# Patient Record
Sex: Female | Born: 1935
Health system: Southern US, Community
[De-identification: ages and names within clinical notes are randomized; demographics above are authoritative.]

## PROBLEM LIST (undated history)

## (undated) DIAGNOSIS — K5790 Diverticulosis of intestine, part unspecified, without perforation or abscess without bleeding: Secondary | ICD-10-CM

## (undated) DIAGNOSIS — C009 Malignant neoplasm of lip, unspecified: Secondary | ICD-10-CM

## (undated) DIAGNOSIS — N183 Chronic kidney disease, stage 3 unspecified: Secondary | ICD-10-CM

## (undated) DIAGNOSIS — M170 Bilateral primary osteoarthritis of knee: Secondary | ICD-10-CM

## (undated) DIAGNOSIS — C7800 Secondary malignant neoplasm of unspecified lung: Secondary | ICD-10-CM

## (undated) DIAGNOSIS — M7021 Olecranon bursitis, right elbow: Secondary | ICD-10-CM

## (undated) DIAGNOSIS — D509 Iron deficiency anemia, unspecified: Secondary | ICD-10-CM

## (undated) DIAGNOSIS — Z7901 Long term (current) use of anticoagulants: Secondary | ICD-10-CM

## (undated) DIAGNOSIS — E785 Hyperlipidemia, unspecified: Secondary | ICD-10-CM

## (undated) DIAGNOSIS — IMO0001 Reserved for inherently not codable concepts without codable children: Secondary | ICD-10-CM

## (undated) DIAGNOSIS — Z8489 Family history of other specified conditions: Secondary | ICD-10-CM

## (undated) DIAGNOSIS — R195 Other fecal abnormalities: Secondary | ICD-10-CM

## (undated) DIAGNOSIS — L97909 Non-pressure chronic ulcer of unspecified part of unspecified lower leg with unspecified severity: Secondary | ICD-10-CM

## (undated) DIAGNOSIS — R7301 Impaired fasting glucose: Secondary | ICD-10-CM

## (undated) DIAGNOSIS — C8290 Follicular lymphoma, unspecified, unspecified site: Secondary | ICD-10-CM

## (undated) DIAGNOSIS — I890 Lymphedema, not elsewhere classified: Secondary | ICD-10-CM

## (undated) DIAGNOSIS — I4819 Other persistent atrial fibrillation: Secondary | ICD-10-CM

## (undated) DIAGNOSIS — D61810 Antineoplastic chemotherapy induced pancytopenia: Secondary | ICD-10-CM

## (undated) DIAGNOSIS — D638 Anemia in other chronic diseases classified elsewhere: Secondary | ICD-10-CM

## (undated) DIAGNOSIS — E039 Hypothyroidism, unspecified: Secondary | ICD-10-CM

## (undated) DIAGNOSIS — I10 Essential (primary) hypertension: Secondary | ICD-10-CM

## (undated) DIAGNOSIS — I2722 Pulmonary hypertension due to left heart disease: Secondary | ICD-10-CM

## (undated) DIAGNOSIS — E538 Deficiency of other specified B group vitamins: Secondary | ICD-10-CM

## (undated) DIAGNOSIS — T451X5A Adverse effect of antineoplastic and immunosuppressive drugs, initial encounter: Secondary | ICD-10-CM

## (undated) DIAGNOSIS — E669 Obesity, unspecified: Secondary | ICD-10-CM

## (undated) DIAGNOSIS — I5032 Chronic diastolic (congestive) heart failure: Secondary | ICD-10-CM

## (undated) DIAGNOSIS — F419 Anxiety disorder, unspecified: Secondary | ICD-10-CM

## (undated) DIAGNOSIS — I83009 Varicose veins of unspecified lower extremity with ulcer of unspecified site: Secondary | ICD-10-CM

## (undated) DIAGNOSIS — J449 Chronic obstructive pulmonary disease, unspecified: Secondary | ICD-10-CM

## (undated) HISTORY — PX: TRANSTHORACIC ECHOCARDIOGRAM: SHX275

## (undated) HISTORY — DX: Adverse effect of antineoplastic and immunosuppressive drugs, initial encounter: T45.1X5A

## (undated) HISTORY — DX: Impaired fasting glucose: R73.01

## (undated) HISTORY — DX: Bilateral primary osteoarthritis of knee: M17.0

## (undated) HISTORY — DX: Other fecal abnormalities: R19.5

## (undated) HISTORY — DX: Pulmonary hypertension due to left heart disease: I27.22

## (undated) HISTORY — PX: BREAST BIOPSY: SHX20

## (undated) HISTORY — DX: Anemia in other chronic diseases classified elsewhere: D63.8

## (undated) HISTORY — DX: Iron deficiency anemia, unspecified: D50.9

## (undated) HISTORY — DX: Olecranon bursitis, right elbow: M70.21

## (undated) HISTORY — DX: Deficiency of other specified B group vitamins: E53.8

## (undated) HISTORY — DX: Malignant neoplasm of lip, unspecified: C00.9

## (undated) HISTORY — DX: Chronic kidney disease, stage 3 (moderate): N18.3

## (undated) HISTORY — DX: Lymphedema, not elsewhere classified: I89.0

## (undated) HISTORY — DX: Obesity, unspecified: E66.9

## (undated) HISTORY — DX: Follicular lymphoma, unspecified, unspecified site: C82.90

## (undated) HISTORY — PX: COLONOSCOPY: SHX174

## (undated) HISTORY — DX: Hyperlipidemia, unspecified: E78.5

## (undated) HISTORY — DX: Essential (primary) hypertension: I10

## (undated) HISTORY — DX: Chronic diastolic (congestive) heart failure: I50.32

## (undated) HISTORY — PX: HIP FRACTURE SURGERY: SHX118

## (undated) HISTORY — DX: Non-pressure chronic ulcer of unspecified part of unspecified lower leg with unspecified severity: I83.009

## (undated) HISTORY — DX: Hypothyroidism, unspecified: E03.9

## (undated) HISTORY — DX: Other persistent atrial fibrillation: I48.19

## (undated) HISTORY — DX: Varicose veins of unspecified lower extremity with ulcer of unspecified site: L97.909

## (undated) HISTORY — DX: Chronic kidney disease, stage 3 unspecified: N18.30

## (undated) HISTORY — PX: FRACTURE SURGERY: SHX138

## (undated) HISTORY — DX: Antineoplastic chemotherapy induced pancytopenia: D61.810

## (undated) HISTORY — DX: Diverticulosis of intestine, part unspecified, without perforation or abscess without bleeding: K57.90

---

## 1976-01-24 HISTORY — PX: ABDOMINAL HYSTERECTOMY: SHX81

## 1998-12-27 ENCOUNTER — Other Ambulatory Visit: Admission: RE | Admit: 1998-12-27 | Discharge: 1999-01-15 | Payer: Self-pay | Admitting: Family Medicine

## 2001-04-09 ENCOUNTER — Encounter: Payer: Self-pay | Admitting: General Surgery

## 2001-04-09 ENCOUNTER — Encounter: Admission: RE | Admit: 2001-04-09 | Discharge: 2001-04-09 | Payer: Self-pay | Admitting: General Surgery

## 2001-10-15 ENCOUNTER — Encounter: Payer: Self-pay | Admitting: General Surgery

## 2001-10-15 ENCOUNTER — Encounter: Admission: RE | Admit: 2001-10-15 | Discharge: 2001-10-15 | Payer: Self-pay | Admitting: General Surgery

## 2002-12-23 ENCOUNTER — Encounter: Admission: RE | Admit: 2002-12-23 | Discharge: 2002-12-23 | Payer: Self-pay | Admitting: General Surgery

## 2004-02-12 ENCOUNTER — Emergency Department (HOSPITAL_COMMUNITY): Admission: EM | Admit: 2004-02-12 | Discharge: 2004-02-12 | Payer: Self-pay | Admitting: Emergency Medicine

## 2004-11-15 ENCOUNTER — Encounter: Admission: RE | Admit: 2004-11-15 | Discharge: 2004-11-15 | Payer: Self-pay | Admitting: General Surgery

## 2005-12-12 ENCOUNTER — Encounter: Admission: RE | Admit: 2005-12-12 | Discharge: 2005-12-12 | Payer: Self-pay | Admitting: Family Medicine

## 2006-03-01 DIAGNOSIS — M810 Age-related osteoporosis without current pathological fracture: Secondary | ICD-10-CM | POA: Insufficient documentation

## 2006-12-28 ENCOUNTER — Encounter: Admission: RE | Admit: 2006-12-28 | Discharge: 2006-12-28 | Payer: Self-pay | Admitting: Family Medicine

## 2007-12-31 ENCOUNTER — Encounter: Admission: RE | Admit: 2007-12-31 | Discharge: 2007-12-31 | Payer: Self-pay | Admitting: Family Medicine

## 2010-01-30 ENCOUNTER — Emergency Department (HOSPITAL_COMMUNITY)
Admission: EM | Admit: 2010-01-30 | Discharge: 2010-01-30 | Payer: Self-pay | Source: Home / Self Care | Admitting: Emergency Medicine

## 2010-12-20 ENCOUNTER — Other Ambulatory Visit: Payer: Self-pay | Admitting: Family Medicine

## 2010-12-20 DIAGNOSIS — Z1231 Encounter for screening mammogram for malignant neoplasm of breast: Secondary | ICD-10-CM

## 2011-01-13 ENCOUNTER — Ambulatory Visit
Admission: RE | Admit: 2011-01-13 | Discharge: 2011-01-13 | Disposition: A | Discharge status: Home / Self Care | Payer: Medicare Other | Source: Ambulatory Visit | Attending: Family Medicine | Admitting: Family Medicine

## 2011-01-13 DIAGNOSIS — Z1231 Encounter for screening mammogram for malignant neoplasm of breast: Secondary | ICD-10-CM

## 2011-05-08 ENCOUNTER — Other Ambulatory Visit: Payer: Self-pay | Admitting: Family Medicine

## 2011-05-11 ENCOUNTER — Encounter: Payer: Self-pay | Admitting: *Deleted

## 2012-02-12 ENCOUNTER — Other Ambulatory Visit: Payer: Self-pay | Admitting: Family Medicine

## 2012-02-12 DIAGNOSIS — Z1231 Encounter for screening mammogram for malignant neoplasm of breast: Secondary | ICD-10-CM

## 2012-03-01 ENCOUNTER — Ambulatory Visit: Payer: Medicare Other

## 2012-03-14 ENCOUNTER — Ambulatory Visit
Admission: RE | Admit: 2012-03-14 | Discharge: 2012-03-14 | Disposition: A | Discharge status: Home / Self Care | Payer: Medicare Other | Source: Ambulatory Visit | Attending: Family Medicine | Admitting: Family Medicine

## 2012-03-14 DIAGNOSIS — Z1231 Encounter for screening mammogram for malignant neoplasm of breast: Secondary | ICD-10-CM

## 2012-03-22 ENCOUNTER — Ambulatory Visit: Payer: Medicare Other

## 2013-05-05 ENCOUNTER — Encounter (HOSPITAL_COMMUNITY): Payer: Self-pay | Admitting: Emergency Medicine

## 2013-05-05 ENCOUNTER — Emergency Department (HOSPITAL_COMMUNITY): Payer: Medicare Other

## 2013-05-05 ENCOUNTER — Emergency Department (HOSPITAL_COMMUNITY)
Admission: EM | Admit: 2013-05-05 | Discharge: 2013-05-05 | Disposition: A | Discharge status: Home / Self Care | Payer: Medicare Other | Attending: Emergency Medicine | Admitting: Emergency Medicine

## 2013-05-05 DIAGNOSIS — I10 Essential (primary) hypertension: Secondary | ICD-10-CM | POA: Insufficient documentation

## 2013-05-05 DIAGNOSIS — Z7982 Long term (current) use of aspirin: Secondary | ICD-10-CM | POA: Insufficient documentation

## 2013-05-05 DIAGNOSIS — Y9301 Activity, walking, marching and hiking: Secondary | ICD-10-CM | POA: Insufficient documentation

## 2013-05-05 DIAGNOSIS — Z79899 Other long term (current) drug therapy: Secondary | ICD-10-CM | POA: Insufficient documentation

## 2013-05-05 DIAGNOSIS — S8992XA Unspecified injury of left lower leg, initial encounter: Secondary | ICD-10-CM

## 2013-05-05 DIAGNOSIS — W19XXXA Unspecified fall, initial encounter: Secondary | ICD-10-CM

## 2013-05-05 DIAGNOSIS — E559 Vitamin D deficiency, unspecified: Secondary | ICD-10-CM | POA: Insufficient documentation

## 2013-05-05 DIAGNOSIS — Y929 Unspecified place or not applicable: Secondary | ICD-10-CM | POA: Insufficient documentation

## 2013-05-05 DIAGNOSIS — S99929A Unspecified injury of unspecified foot, initial encounter: Principal | ICD-10-CM

## 2013-05-05 DIAGNOSIS — S8990XA Unspecified injury of unspecified lower leg, initial encounter: Secondary | ICD-10-CM | POA: Insufficient documentation

## 2013-05-05 DIAGNOSIS — S99919A Unspecified injury of unspecified ankle, initial encounter: Principal | ICD-10-CM

## 2013-05-05 DIAGNOSIS — W010XXA Fall on same level from slipping, tripping and stumbling without subsequent striking against object, initial encounter: Secondary | ICD-10-CM | POA: Insufficient documentation

## 2013-05-05 NOTE — ED Provider Notes (Signed)
CSN: [id number]     Arrival date & time 05/05/13  1826 History   First MD Initiated Contact with Patient 05/05/13 2030     Chief Complaint  Patient presents with  . Fall     (Consider location/radiation/quality/duration/timing/severity/associated sxs/prior Treatment) HPI 80 year old female presents with left buttock pain after a fall 3 days ago. She states she's walking on uneven ground and thinks she may have slipped. She states she did not pass out, hit her had, have chest pain, dizziness, or shortness of breath. She's been ambulate since then but it hurts. She has no pain at rest. Her family encouraged her to be evaluated due to extensive bruising noted. He states her pain is a 1 currently. She declines pain medicine currently. No other injury besides her left buttock/pelvis.  Past Medical History  Diagnosis Date  . Vitamin B 12 deficiency   . HTN (hypertension)   . Vitamin D deficiency   . HLD (hyperlipidemia)    Past Surgical History  Procedure Laterality Date  . Breast biopsy     Family History  Problem Relation Age of Onset  . Stomach cancer    . Melanoma    . Lung disease    . Lymphoma    . Diabetes     History  Substance Use Topics  . Smoking status: Never Smoker   . Smokeless tobacco: Not on file  . Alcohol Use: No   OB History   Grav Para Term Preterm Abortions TAB SAB Ect Mult Living                 Review of Systems  Gastrointestinal: Negative for vomiting.  Musculoskeletal: Positive for arthralgias.  Skin: Positive for color change (ecchymosis).  Neurological: Negative for syncope, weakness, numbness and headaches.      Allergies  Review of patient's allergies indicates no known allergies.  Home Medications   Current Outpatient Rx  Name  Route  Sig  Dispense  Refill  . alendronate (FOSAMAX) 70 MG tablet   Oral   Take 70 mg by mouth every 7 (seven) days. Take with a full glass of water on an empty stomach.         Marland Kitchen aspirin 81 MG tablet  Oral   Take 81 mg by mouth daily.         . Calcium Carbonate (CALTRATE 600 PO)   Oral   Take by mouth daily.         . fish oil-omega-3 fatty acids 1000 MG capsule   Oral   Take 2 g by mouth daily.         . folic acid (FOLVITE) 409 MCG tablet   Oral   Take 400 mcg by mouth daily.         Marland Kitchen lisinopril-hydrochlorothiazide (PRINZIDE,ZESTORETIC) 20-25 MG per tablet   Oral   Take 1 tablet by mouth daily.          BP 154/84  Pulse 84  Temp(Src) 98.1 F (36.7 C) (Oral)  Resp 16  Ht 5\' 7"  (1.702 m)  Wt 182 lb (82.555 kg)  BMI 28.50 kg/m2  SpO2 100% Physical Exam  Nursing note and vitals reviewed. Constitutional: She is oriented to person, place, and time. She appears well-developed and well-nourished.  HENT:  Head: Normocephalic and atraumatic.  Right Ear: External ear normal.  Left Ear: External ear normal.  Nose: Nose normal.  Eyes: Right eye exhibits no discharge. Left eye exhibits no discharge.  Cardiovascular: Normal rate, regular  rhythm and normal heart sounds.   Pulmonary/Chest: Effort normal and breath sounds normal.  Abdominal: Soft. She exhibits no distension. There is no tenderness.  Musculoskeletal:       Left hip: Normal. She exhibits normal range of motion and no tenderness.       Legs: Tenderness over left iliac wing  Neurological: She is alert and oriented to person, place, and time.  Skin: Skin is warm and dry.    ED Course  Procedures (including critical care time) Labs Review Labs Reviewed - No data to display Imaging Review Dg Pelvis 1-2 Views  05/05/2013   CLINICAL DATA:  Fall.  Left buttock injury.  EXAM: PELVIS - 1-2 VIEW  COMPARISON:  01/30/2010  FINDINGS: No evidence of fracture or dislocation. No degenerative change of the hips. Ordinary osteoarthritis of the sacroiliac joints an ordinary lower lumbar spondylosis.  IMPRESSION: IMPRESSION No acute or traumatic finding.   Electronically Signed   By: Nelson Chimes M.D.   On: 05/05/2013  21:30     EKG Interpretation None      MDM   Final diagnoses:  Fall  Left leg injury    80 year old female with what appears to be a mechanical fall a few days ago. She has extensive ecchymosis but normal x-ray. She's able to ambulate and bear weight. There is no pain with range of motion of her hip. There is tenderness upon palpation of her pelvis but no signs of fracture. At this point discharge her with followup with her PCP.    Ephraim Hamburger, MD 05/05/13 2156

## 2013-05-05 NOTE — ED Notes (Signed)
PT ambulated independently with stand-by assist. Pt in NAD.

## 2013-05-05 NOTE — ED Notes (Signed)
The patient said she tripped and fell on her right side on to cement.  The patient said she thought the pain would go away and it has gotten worse so she decided to come to the ED.  The patient said she has a bruise on her bottom.  I asked to see it, and she does have extensive bruising to her left buttocks.

## 2013-05-05 NOTE — ED Notes (Signed)
Ambulated patient with minimal assistance, and patient did well.

## 2014-07-22 ENCOUNTER — Emergency Department (HOSPITAL_COMMUNITY): Payer: Medicare HMO

## 2014-07-22 ENCOUNTER — Inpatient Hospital Stay (HOSPITAL_COMMUNITY): Payer: Medicare HMO

## 2014-07-22 ENCOUNTER — Encounter (HOSPITAL_COMMUNITY): Payer: Self-pay | Admitting: Family Medicine

## 2014-07-22 ENCOUNTER — Inpatient Hospital Stay (HOSPITAL_COMMUNITY)
Admission: EM | Admit: 2014-07-22 | Discharge: 2014-07-24 | DRG: 292 | Disposition: A | Discharge status: Home / Self Care | Payer: Medicare HMO | Attending: Internal Medicine | Admitting: Internal Medicine

## 2014-07-22 DIAGNOSIS — D638 Anemia in other chronic diseases classified elsewhere: Secondary | ICD-10-CM | POA: Diagnosis present

## 2014-07-22 DIAGNOSIS — I509 Heart failure, unspecified: Secondary | ICD-10-CM

## 2014-07-22 DIAGNOSIS — D509 Iron deficiency anemia, unspecified: Secondary | ICD-10-CM | POA: Diagnosis present

## 2014-07-22 DIAGNOSIS — C801 Malignant (primary) neoplasm, unspecified: Secondary | ICD-10-CM

## 2014-07-22 DIAGNOSIS — Z800 Family history of malignant neoplasm of digestive organs: Secondary | ICD-10-CM

## 2014-07-22 DIAGNOSIS — E039 Hypothyroidism, unspecified: Secondary | ICD-10-CM | POA: Diagnosis present

## 2014-07-22 DIAGNOSIS — Z833 Family history of diabetes mellitus: Secondary | ICD-10-CM | POA: Diagnosis not present

## 2014-07-22 DIAGNOSIS — C7800 Secondary malignant neoplasm of unspecified lung: Secondary | ICD-10-CM | POA: Diagnosis present

## 2014-07-22 DIAGNOSIS — Z7983 Long term (current) use of bisphosphonates: Secondary | ICD-10-CM | POA: Diagnosis not present

## 2014-07-22 DIAGNOSIS — I5042 Chronic combined systolic (congestive) and diastolic (congestive) heart failure: Secondary | ICD-10-CM

## 2014-07-22 DIAGNOSIS — R0600 Dyspnea, unspecified: Secondary | ICD-10-CM | POA: Diagnosis not present

## 2014-07-22 DIAGNOSIS — Z807 Family history of other malignant neoplasms of lymphoid, hematopoietic and related tissues: Secondary | ICD-10-CM

## 2014-07-22 DIAGNOSIS — E785 Hyperlipidemia, unspecified: Secondary | ICD-10-CM | POA: Diagnosis present

## 2014-07-22 DIAGNOSIS — Z808 Family history of malignant neoplasm of other organs or systems: Secondary | ICD-10-CM | POA: Diagnosis not present

## 2014-07-22 DIAGNOSIS — I5031 Acute diastolic (congestive) heart failure: Secondary | ICD-10-CM | POA: Diagnosis present

## 2014-07-22 DIAGNOSIS — Z7982 Long term (current) use of aspirin: Secondary | ICD-10-CM | POA: Diagnosis not present

## 2014-07-22 DIAGNOSIS — D649 Anemia, unspecified: Secondary | ICD-10-CM

## 2014-07-22 DIAGNOSIS — R161 Splenomegaly, not elsewhere classified: Secondary | ICD-10-CM | POA: Diagnosis present

## 2014-07-22 DIAGNOSIS — C7900 Secondary malignant neoplasm of unspecified kidney and renal pelvis: Secondary | ICD-10-CM | POA: Diagnosis present

## 2014-07-22 DIAGNOSIS — R591 Generalized enlarged lymph nodes: Secondary | ICD-10-CM | POA: Diagnosis present

## 2014-07-22 DIAGNOSIS — E538 Deficiency of other specified B group vitamins: Secondary | ICD-10-CM | POA: Diagnosis present

## 2014-07-22 DIAGNOSIS — R0602 Shortness of breath: Secondary | ICD-10-CM | POA: Diagnosis present

## 2014-07-22 DIAGNOSIS — I10 Essential (primary) hypertension: Secondary | ICD-10-CM | POA: Diagnosis present

## 2014-07-22 DIAGNOSIS — R911 Solitary pulmonary nodule: Secondary | ICD-10-CM | POA: Diagnosis present

## 2014-07-22 DIAGNOSIS — E559 Vitamin D deficiency, unspecified: Secondary | ICD-10-CM | POA: Diagnosis present

## 2014-07-22 DIAGNOSIS — I5032 Chronic diastolic (congestive) heart failure: Secondary | ICD-10-CM

## 2014-07-22 HISTORY — DX: Secondary malignant neoplasm of unspecified lung: C78.00

## 2014-07-22 LAB — BASIC METABOLIC PANEL
Anion gap: 10 (ref 5–15)
BUN: 26 mg/dL — ABNORMAL HIGH (ref 6–20)
CO2: 26 mmol/L (ref 22–32)
Calcium: 8.9 mg/dL (ref 8.9–10.3)
Chloride: 101 mmol/L (ref 101–111)
Creatinine, Ser: 0.96 mg/dL (ref 0.44–1.00)
GFR calc Af Amer: >60 mL/min (ref >60)
GFR calc non Af Amer: 55 mL/min — ABNORMAL LOW (ref >60)
Glucose, Bld: 109 mg/dL — ABNORMAL HIGH (ref 65–99)
Potassium: 4.6 mmol/L (ref 3.5–5.1)
Sodium: 137 mmol/L (ref 135–145)

## 2014-07-22 LAB — CREATININE, SERUM
Creatinine, Ser: 0.93 mg/dL (ref 0.44–1.00)
GFR calc Af Amer: >60 mL/min (ref >60)
GFR calc non Af Amer: 57 mL/min — ABNORMAL LOW (ref >60)

## 2014-07-22 LAB — CBC
HCT: 27.3 % — ABNORMAL LOW (ref 36.0–46.0)
HCT: 27.8 % — ABNORMAL LOW (ref 36.0–46.0)
Hemoglobin: 8.3 g/dL — ABNORMAL LOW (ref 12.0–15.0)
Hemoglobin: 8.5 g/dL — ABNORMAL LOW (ref 12.0–15.0)
MCH: 23 pg — ABNORMAL LOW (ref 26.0–34.0)
MCH: 23.2 pg — ABNORMAL LOW (ref 26.0–34.0)
MCHC: 30.4 g/dL (ref 30.0–36.0)
MCHC: 30.6 g/dL (ref 30.0–36.0)
MCV: 75.6 fL — ABNORMAL LOW (ref 78.0–100.0)
MCV: 75.7 fL — ABNORMAL LOW (ref 78.0–100.0)
Platelets: 190 10*3/uL (ref 150–400)
Platelets: 202 10*3/uL (ref 150–400)
RBC: 3.61 MIL/uL — ABNORMAL LOW (ref 3.87–5.11)
RBC: 3.67 MIL/uL — ABNORMAL LOW (ref 3.87–5.11)
RDW: 16.6 % — ABNORMAL HIGH (ref 11.5–15.5)
RDW: 16.7 % — ABNORMAL HIGH (ref 11.5–15.5)
WBC: 4.3 10*3/uL (ref 4.0–10.5)
WBC: 4.7 10*3/uL (ref 4.0–10.5)

## 2014-07-22 LAB — TROPONIN I
Troponin I: <0.03 ng/mL (ref <0.031)
Troponin I: <0.03 ng/mL (ref <0.031)
Troponin I: <0.03 ng/mL (ref <0.031)

## 2014-07-22 LAB — FOLATE: Folate: 18.9 ng/mL (ref >5.9)

## 2014-07-22 LAB — RETICULOCYTES
RBC.: 3.61 MIL/uL — ABNORMAL LOW (ref 3.87–5.11)
Retic Count, Absolute: 61.4 10*3/uL (ref 19.0–186.0)
Retic Ct Pct: 1.7 % (ref 0.4–3.1)

## 2014-07-22 LAB — I-STAT TROPONIN, ED: Troponin i, poc: 0 ng/mL (ref 0.00–0.08)

## 2014-07-22 LAB — BRAIN NATRIURETIC PEPTIDE: B Natriuretic Peptide: 282.2 pg/mL — ABNORMAL HIGH (ref 0.0–100.0)

## 2014-07-22 LAB — VITAMIN B12: Vitamin B-12: 1001 pg/mL — ABNORMAL HIGH (ref 180–914)

## 2014-07-22 LAB — IRON AND TIBC
Iron: 23 ug/dL — ABNORMAL LOW (ref 28–170)
Saturation Ratios: 8 % — ABNORMAL LOW (ref 10.4–31.8)
TIBC: 291 ug/dL (ref 250–450)
UIBC: 268 ug/dL

## 2014-07-22 LAB — FERRITIN: Ferritin: 44 ng/mL (ref 11–307)

## 2014-07-22 LAB — D-DIMER, QUANTITATIVE: D-Dimer, Quant: 3.64 ug/mL-FEU — ABNORMAL HIGH (ref 0.00–0.48)

## 2014-07-22 LAB — TSH: TSH: 8.644 u[IU]/mL — ABNORMAL HIGH (ref 0.350–4.500)

## 2014-07-22 MED ORDER — FUROSEMIDE 10 MG/ML IJ SOLN
20.0000 mg | Freq: Once | INTRAMUSCULAR | Status: AC
  Administered 2014-07-22: 20 mg via INTRAVENOUS
  Filled 2014-07-22: qty 2

## 2014-07-22 MED ORDER — ONDANSETRON HCL 4 MG/2ML IJ SOLN: 4.0000 mg | Freq: Four times a day (QID) | INTRAMUSCULAR | Status: DC | PRN

## 2014-07-22 MED ORDER — LEVOTHYROXINE SODIUM 75 MCG PO TABS
75.0000 ug | ORAL_TABLET | Freq: Every day | ORAL | Status: DC
  Administered 2014-07-23: 75 ug via ORAL
  Filled 2014-07-22: qty 1

## 2014-07-22 MED ORDER — SODIUM CHLORIDE 0.9 % IV SOLN: 250.0000 mL | INTRAVENOUS | Status: DC | PRN

## 2014-07-22 MED ORDER — ACETAMINOPHEN 325 MG PO TABS: 650.0000 mg | ORAL_TABLET | ORAL | Status: DC | PRN

## 2014-07-22 MED ORDER — CARVEDILOL 3.125 MG PO TABS
3.1250 mg | ORAL_TABLET | Freq: Two times a day (BID) | ORAL | Status: DC
  Administered 2014-07-22 – 2014-07-24 (×4): 3.125 mg via ORAL
  Filled 2014-07-22 (×5): qty 1

## 2014-07-22 MED ORDER — ASPIRIN 81 MG PO TABS: 81.0000 mg | ORAL_TABLET | Freq: Every day | ORAL | Status: DC

## 2014-07-22 MED ORDER — PRAVASTATIN SODIUM 80 MG PO TABS
80.0000 mg | ORAL_TABLET | Freq: Every day | ORAL | Status: DC
  Administered 2014-07-23 – 2014-07-24 (×2): 80 mg via ORAL
  Filled 2014-07-22 (×2): qty 1

## 2014-07-22 MED ORDER — HEPARIN SODIUM (PORCINE) 5000 UNIT/ML IJ SOLN
5000.0000 [IU] | Freq: Three times a day (TID) | INTRAMUSCULAR | Status: DC
  Administered 2014-07-22 – 2014-07-24 (×6): 5000 [IU] via SUBCUTANEOUS
  Filled 2014-07-22 (×7): qty 1

## 2014-07-22 MED ORDER — SODIUM CHLORIDE 0.9 % IJ SOLN: 3.0000 mL | INTRAMUSCULAR | Status: DC | PRN

## 2014-07-22 MED ORDER — IOHEXOL 350 MG/ML SOLN
100.0000 mL | Freq: Once | INTRAVENOUS | Status: AC | PRN
  Administered 2014-07-22: 100 mL via INTRAVENOUS

## 2014-07-22 MED ORDER — FUROSEMIDE 10 MG/ML IJ SOLN
40.0000 mg | Freq: Every day | INTRAMUSCULAR | Status: DC
  Administered 2014-07-22 – 2014-07-23 (×2): 40 mg via INTRAVENOUS
  Filled 2014-07-22 (×2): qty 4

## 2014-07-22 MED ORDER — OMEGA-3 FATTY ACIDS 1000 MG PO CAPS: 2.0000 g | ORAL_CAPSULE | Freq: Every day | ORAL | Status: DC

## 2014-07-22 MED ORDER — OMEGA-3-ACID ETHYL ESTERS 1 G PO CAPS
2.0000 g | ORAL_CAPSULE | Freq: Every day | ORAL | Status: DC
  Administered 2014-07-23 – 2014-07-24 (×2): 2 g via ORAL
  Filled 2014-07-22 (×2): qty 2

## 2014-07-22 MED ORDER — SODIUM CHLORIDE 0.9 % IJ SOLN
3.0000 mL | Freq: Two times a day (BID) | INTRAMUSCULAR | Status: DC
  Administered 2014-07-22 – 2014-07-23 (×4): 3 mL via INTRAVENOUS

## 2014-07-22 MED ORDER — LISINOPRIL 40 MG PO TABS
40.0000 mg | ORAL_TABLET | Freq: Every day | ORAL | Status: DC
  Filled 2014-07-22: qty 1

## 2014-07-22 MED ORDER — ASPIRIN 81 MG PO CHEW
81.0000 mg | CHEWABLE_TABLET | Freq: Every day | ORAL | Status: DC
  Administered 2014-07-23 – 2014-07-24 (×2): 81 mg via ORAL
  Filled 2014-07-22 (×2): qty 1

## 2014-07-22 NOTE — H&P (Signed)
History and Physical        Hospital Admission Note Date: 07/22/2014  Patient name: Amanda Sellers NOIBBCW Medical record number: [id number] Date of birth: [birth year] Age: 81 y.o. Gender: female  PCP: Florina Ou, MD  Referring physician: Dr. Coralyn Pear  Chief Complaint:  Shortness of breath with exertion, orthopnea, PND x 2 weeks  HPI: Patient is a 81 year old female with hypothyroidism, hypertension, hyperlipidemia presented to ED with worsening shortness of breath over the past 2 weeks. Patient reported no chest pain but gradually worsening shortness of breath, has been sleeping on 2 pillows or sitting upright, sometimes difficult to breathe. She denied any wheezing, fevers or chills or any productive cough. She denies any prior history of coronary artery disease. Patient went to the urgent care a few weeks ago and was diagnosed with bronchitis, placed on antibiotics however symptoms did not completely improve. She denied any recent weight gain however has noticed swelling of her legs. Patient also had noticed left arm pain in the last 2 weeks which spontaneously resolved, had no associated chest pain, nausea, diaphoresis or any numbness or tingling in the arm. ED workup showed BNP of 282, BMET unremarkable, hemoglobin 8.3, troponin negative. Chest x-ray showed 9 into 9 mm nodular opacity in the left mid lung region, bibasilar interstitial edema with mild cardiomegaly, no airspace consolidation.   Review of Systems:  Constitutional: Denies fever, chills, diaphoresis, poor appetite and fatigue.  HEENT: Denies photophobia, eye pain, redness, hearing loss, ear pain, congestion, sore throat, rhinorrhea, sneezing, mouth sores, trouble swallowing, neck pain, neck stiffness and tinnitus.   Respiratory: Please see history of present illness Cardiovascular: Denies chest pain, palpitations, +l eg  swelling.  Gastrointestinal: Denies nausea, vomiting, abdominal pain, diarrhea, constipation, blood in stool and abdominal distention.  Genitourinary: Denies dysuria, urgency, frequency, hematuria, flank pain and difficulty urinating.  Musculoskeletal: Denies myalgias, back pain, joint swelling, arthralgias and gait problem.  Skin: Denies pallor, rash and wound.  Neurological: Denies dizziness, seizures, syncope, weakness, light-headedness, numbness and headaches.  Hematological: Denies adenopathy. Easy bruising, personal or family bleeding history  Psychiatric/Behavioral: Denies suicidal ideation, mood changes, confusion, nervousness, sleep disturbance and agitation  Past Medical History: Past Medical History  Diagnosis Date  . Vitamin B 12 deficiency   . HTN (hypertension)   . Vitamin D deficiency   . HLD (hyperlipidemia)     Past Surgical History  Procedure Laterality Date  . Breast biopsy      Medications: Prior to Admission medications   Medication Sig Start Date End Date Taking? Authorizing Provider  amLODipine (NORVASC) 2.5 MG tablet Take 2.5 mg by mouth daily.   Yes Historical Provider, MD  aspirin 81 MG tablet Take 81 mg by mouth daily.   Yes Historical Provider, MD  ergocalciferol (VITAMIN D2) 50000 UNITS capsule Take 50,000 Units by mouth once a week. On Sundays   Yes Historical Provider, MD  fish oil-omega-3 fatty acids 1000 MG capsule Take 2 g by mouth daily.   Yes Historical Provider, MD  hydrochlorothiazide (HYDRODIURIL) 25 MG tablet Take 25 mg by mouth daily.   Yes Historical Provider, MD  levothyroxine (SYNTHROID, LEVOTHROID) 75 MCG tablet Take 75 mcg by mouth  daily before breakfast.   Yes Historical Provider, MD  lisinopril (PRINIVIL,ZESTRIL) 40 MG tablet Take 40 mg by mouth daily.   Yes Historical Provider, MD  pravastatin (PRAVACHOL) 80 MG tablet Take 80 mg by mouth daily.   Yes Historical Provider, MD    Allergies:  No Known Allergies  Social History:   reports that she has never smoked. She does not have any smokeless tobacco history on file. She reports that she does not drink alcohol. Her drug history is not on file.  Family History: Family History  Problem Relation Age of Onset  . Stomach cancer    . Melanoma    . Lung disease    . Lymphoma    . Diabetes      Physical Exam: Blood pressure 134/68, pulse 85, temperature 97.5 F (36.4 C), temperature source Oral, resp. rate 16, SpO2 100 %. General: Alert, awake, oriented x3, in no acute distress. HEENT: normocephalic, atraumatic, anicteric sclera, pink conjunctiva, pupils equal and reactive to light and accomodation, oropharynx clear Neck: supple, no masses or lymphadenopathy, no goiter, no bruits, +JVD  Heart: Regular rate and rhythm, without murmurs, rubs or gallops. Lungs: Decreased breath sounds at the bases Abdomen: Soft, nontender, nondistended, positive bowel sounds, no masses. Extremities: No clubbing, cyanosis, 2+ edema with positive pedal pulses. Neuro: Grossly intact, no focal neurological deficits, strength 5/5 upper and lower extremities bilaterally Psych: alert and oriented x 3, normal mood and affect Skin: no rashes or lesions, warm and dry   LABS on Admission:  Basic Metabolic Panel:  Recent Labs Lab 07/22/14 0830  NA 137  K 4.6  CL 101  CO2 26  GLUCOSE 109*  BUN 26*  CREATININE 0.96  CALCIUM 8.9   Liver Function Tests: No results for input(s): AST, ALT, ALKPHOS, BILITOT, PROT, ALBUMIN in the last 168 hours. No results for input(s): LIPASE, AMYLASE in the last 168 hours. No results for input(s): AMMONIA in the last 168 hours. CBC:  Recent Labs Lab 07/22/14 0830  WBC 4.3  HGB 8.3*  HCT 27.3*  MCV 75.6*  PLT 190   Cardiac Enzymes: No results for input(s): CKTOTAL, CKMB, CKMBINDEX, TROPONINI in the last 168 hours. BNP: Invalid input(s): POCBNP CBG: No results for input(s): GLUCAP in the last 168 hours.  Radiological Exams on Admission:    Dg Chest Portable 1 View  07/22/2014   CLINICAL DATA:  Intermittent shortness of breath for 3 weeks  EXAM: PORTABLE CHEST - 1 VIEW  COMPARISON:  None.  FINDINGS: There is a 9 x 9 mm nodular opacity in the left mid lung region. There is bibasilar interstitial edema. Lungs elsewhere clear. Heart is mildly enlarged with pulmonary vascularity within normal limits. No adenopathy. No bone lesions.  IMPRESSION: 9 x 9 mm nodular opacity left mid lung region. This nodular opacity potentially could represent a nipple shadow. Repeat study with nipple markers advised. If this structure does not represent a nipple shadow, noncontrast enhanced chest CT to further evaluate would be warranted.  Bibasilar interstitial edema with mild cardiomegaly. Findings are most consistent with a degree of congestive heart failure. There is no airspace consolidation.   Electronically Signed   By: Lowella Grip III M.D.   On: 07/22/2014 08:38    *I have personally reviewed the images above*  EKG: Independently reviewed. Rate 89, normal sinus rhythm, no acute ST-T wave changes suggestive of ischemia   Assessment/Plan Principal Problem:   Dyspnea:  Likely due to acute CHF with clinical symptoms, +JVD,  chest x-ray, elevated BNP, no prior history of coronary artery disease - Admit to telemetry, rule out acute ACS (left arm pain could be angina equivalent), d-dimer, TSH - Placed on CHF pathway, strict I's and O's and daily weights, 2-D echocardiogram - Placed on Lasix 40 mg IV daily, continue ACE inhibitor. Hold Norvasc and HCTZ  Active Problems:   Anemia microcytic - Patient denies any hematochezia or melena or hematemesis, baseline hemoglobin unknown - Obtain anemia panel, stool occult test  Abnormal chest x-ray -Chest x-ray showed 9x9 millimeter nodular opacity in the left midlung region, could represent a nipple shadow, repeat study or noncontrast enhanced chest CT. Await d-dimer if negative, will obtain noncontrast  chest CT.   Hypothyroidism -Obtain TSH, continue Synthroid     Hypertension - Currently stable, continue IV Lasix, ACE inhibitor, holding Norvasc and HCTZ    Hyperlipidemia - Continue statin, obtain lipid panel   DVT prophylaxis: Heparin subcutaneous   CODE STATUS: Full code   Family Communication: Admission, patients condition and plan of care including tests being ordered have been discussed with the patient and daughters who indicates understanding and agree with the plan and Code Status  Disposition plan: Further plan will depend as patient's clinical course evolves and further radiologic and laboratory data become available.    Time Spent on Admission: 1 hour  Perl Folmar M.D. [facility] 07/22/2014, 11:19 AM Pager: [phone]  If 7PM-7AM, please contact night-coverage [email or web address] Password [email or web address]

## 2014-07-22 NOTE — ED Notes (Signed)
Pt placed in gown and in bed. Pt monitored by pulse ox, bp cuff, and 12-lead. 

## 2014-07-22 NOTE — Progress Notes (Signed)
Report called to ED  

## 2014-07-22 NOTE — ED Notes (Signed)
Pt here for worsening SOB over the past 2 weeks. sts also pain in left arm. Pt pale.

## 2014-07-22 NOTE — ED Provider Notes (Signed)
CSN: [id number]     Arrival date & time 07/22/14  0805 History   First MD Initiated Contact with Patient 07/22/14 [phone]     Chief Complaint  Patient presents with  . Shortness of Breath     (Consider location/radiation/quality/duration/timing/severity/associated sxs/prior Treatment) Patient is a 81 y.o. female presenting with shortness of breath. The history is provided by the patient and a relative.  Shortness of Breath Associated symptoms: no abdominal pain, no chest pain, no fever, no headaches, no neck pain, no rash, no sore throat and no vomiting   Patient c/o sob for the past few weeks. Gradual onset, slowly worse. Persistent. +orthopnea. Has felt 'tight' in chest at times, not related to activity or exertion. No current chest pain or discomfort. No hx cad or chf. No fam hx cad. Remote hx anemia. No recent blood loss, rectal bleeding or melena. No fever or chills. Occasional non prod cough. States went to urgent care a couple weeks ago w same, was dx w bronchitis and placed on abx, but symptoms no better. Denies hx similar symptoms in past. No recent wt gain, but possibly couple lb wt loss. Normal appetite, normal po intake.       Past Medical History  Diagnosis Date  . Vitamin B 12 deficiency   . HTN (hypertension)   . Vitamin D deficiency   . HLD (hyperlipidemia)    Past Surgical History  Procedure Laterality Date  . Breast biopsy     Family History  Problem Relation Age of Onset  . Stomach cancer    . Melanoma    . Lung disease    . Lymphoma    . Diabetes     History  Substance Use Topics  . Smoking status: Never Smoker   . Smokeless tobacco: Not on file  . Alcohol Use: No   OB History    No data available     Review of Systems  Constitutional: Negative for fever and chills.  HENT: Negative for sore throat.   Eyes: Negative for redness.  Respiratory: Positive for shortness of breath.   Cardiovascular: Negative for chest pain and leg swelling.   Gastrointestinal: Negative for vomiting, abdominal pain and diarrhea.  Endocrine: Negative for polyuria.  Genitourinary: Negative for dysuria and flank pain.  Musculoskeletal: Negative for back pain and neck pain.  Skin: Negative for rash.  Neurological: Negative for headaches.  Hematological: Does not bruise/bleed easily.  Psychiatric/Behavioral: Negative for confusion.      Allergies  Review of patient's allergies indicates no known allergies.  Home Medications   Prior to Admission medications   Medication Sig Start Date End Date Taking? Authorizing Provider  alendronate (FOSAMAX) 70 MG tablet Take 70 mg by mouth every 7 (seven) days. Take with a full glass of water on an empty stomach.    Historical Provider, MD  aspirin 81 MG tablet Take 81 mg by mouth daily.    Historical Provider, MD  Calcium Carbonate (CALTRATE 600 PO) Take by mouth daily.    Historical Provider, MD  fish oil-omega-3 fatty acids 1000 MG capsule Take 2 g by mouth daily.    Historical Provider, MD  folic acid (FOLVITE) 378 MCG tablet Take 400 mcg by mouth daily.    Historical Provider, MD  lisinopril-hydrochlorothiazide (PRINZIDE,ZESTORETIC) 20-25 MG per tablet Take 1 tablet by mouth daily.    Historical Provider, MD   BP 133/90 mmHg  Pulse 87  Temp(Src) 97.5 F (36.4 C) (Oral)  Resp 22  SpO2 100%  Physical Exam  Constitutional: She appears well-developed and well-nourished. No distress.  HENT:  Mouth/Throat: Oropharynx is clear and moist.  Eyes: Conjunctivae are normal. No scleral icterus.  Neck: Neck supple. No JVD present. No tracheal deviation present.  Cardiovascular: Normal rate, regular rhythm, normal heart sounds and intact distal pulses.  Exam reveals no gallop and no friction rub.   No murmur heard. Pulmonary/Chest: Effort normal and breath sounds normal. No respiratory distress.  Abdominal: Soft. Normal appearance and bowel sounds are normal. She exhibits no distension. There is no  tenderness.  Musculoskeletal: She exhibits edema. She exhibits no tenderness.  Mild bil lower leg edema.   Neurological: She is alert.  Skin: Skin is warm and dry. No rash noted. She is not diaphoretic.  Psychiatric: She has a normal mood and affect.  Nursing note and vitals reviewed.   ED Course  Procedures (including critical care time) Labs Review  Results for orders placed or performed during the hospital encounter of 07/22/14  CBC  Result Value Ref Range   WBC 4.3 4.0 - 10.5 K/uL   RBC 3.61 (L) 3.87 - 5.11 MIL/uL   Hemoglobin 8.3 (L) 12.0 - 15.0 g/dL   HCT 27.3 (L) 36.0 - 46.0 %   MCV 75.6 (L) 78.0 - 100.0 fL   MCH 23.0 (L) 26.0 - 34.0 pg   MCHC 30.4 30.0 - 36.0 g/dL   RDW 16.6 (H) 11.5 - 15.5 %   Platelets 190 150 - 400 K/uL  Basic metabolic panel  Result Value Ref Range   Sodium 137 135 - 145 mmol/L   Potassium 4.6 3.5 - 5.1 mmol/L   Chloride 101 101 - 111 mmol/L   CO2 26 22 - 32 mmol/L   Glucose, Bld 109 (H) 65 - 99 mg/dL   BUN 26 (H) 6 - 20 mg/dL   Creatinine, Ser 0.96 0.44 - 1.00 mg/dL   Calcium 8.9 8.9 - 10.3 mg/dL   GFR calc non Af Amer 55 (L) >60 mL/min   GFR calc Af Amer >60 >60 mL/min   Anion gap 10 5 - 15  BNP (order ONLY if patient complains of dyspnea/SOB AND you have documented it for THIS visit)  Result Value Ref Range   B Natriuretic Peptide 282.2 (H) 0.0 - 100.0 pg/mL  I-stat troponin, ED  (not at [facility])  Result Value Ref Range   Troponin i, poc 0.00 0.00 - 0.08 ng/mL   Comment 3          Type and screen  Result Value Ref Range   ABO/RH(D) AB POS    Antibody Screen PENDING    Sample Expiration 07/25/2014    Dg Chest Portable 1 View  07/22/2014   CLINICAL DATA:  Intermittent shortness of breath for 3 weeks  EXAM: PORTABLE CHEST - 1 VIEW  COMPARISON:  None.  FINDINGS: There is a 9 x 9 mm nodular opacity in the left mid lung region. There is bibasilar interstitial edema. Lungs elsewhere clear. Heart is mildly enlarged with pulmonary  vascularity within normal limits. No adenopathy. No bone lesions.  IMPRESSION: 9 x 9 mm nodular opacity left mid lung region. This nodular opacity potentially could represent a nipple shadow. Repeat study with nipple markers advised. If this structure does not represent a nipple shadow, noncontrast enhanced chest CT to further evaluate would be warranted.  Bibasilar interstitial edema with mild cardiomegaly. Findings are most consistent with a degree of congestive heart failure. There is no airspace consolidation.  Electronically Signed   By: Lowella Grip III M.D.   On: 07/22/2014 08:38       EKG Interpretation   Date/Time:  Wednesday July 22 2014 08:12:03 EDT Ventricular Rate:  89 PR Interval:  184 QRS Duration: 95 QT Interval:  367 QTC Calculation: 446 R Axis:   73 Text Interpretation:  Sinus rhythm Multiple ventricular premature  complexes No previous tracing Confirmed by Ashok Cordia  MD, Lennette Bihari ([id number]) on  07/22/2014 8:15:36 AM      MDM   Iv ns. Labs.  Reviewed nursing notes and prior charts for additional history.   Feel vascular congestion/chf likely contributed to/related to mod anemia, hgb 8 - no prior/recent labs to compared.  Lasix iv. Type and screen.  Will consult med service for admission re correction/improvement anemia, tx chf.     Lajean Saver, MD 07/22/14 1025

## 2014-07-22 NOTE — Progress Notes (Signed)
Amanda Sellers is a 81 y.o. female patient admitted from ED awake, alert - oriented  X 4 - no acute distress noted.  VSS - Blood pressure 133/51, pulse 86, temperature 97.7 F (36.5 C), temperature source Oral, resp. rate 18, weight 78.518 kg (173 lb 1.6 oz), SpO2 100 %.    IV in place, occlusive dsg intact without redness.  Orientation to room, and floor completed with information packet given to patient/family.  Admission INP armband ID verified with patient/family, and in place.   SR up x 2, fall assessment complete, with patient and family able to verbalize understanding of risk associated with falls, and verbalized understanding to call nsg before up out of bed.  Call light within reach, patient able to voice, and demonstrate understanding.       Will cont to eval and treat per MD orders.  Henriette Combs, [place] 07/22/2014 11:44 AM

## 2014-07-23 ENCOUNTER — Inpatient Hospital Stay (HOSPITAL_COMMUNITY): Payer: Medicare HMO

## 2014-07-23 ENCOUNTER — Encounter (HOSPITAL_COMMUNITY): Payer: Self-pay

## 2014-07-23 DIAGNOSIS — R0600 Dyspnea, unspecified: Secondary | ICD-10-CM

## 2014-07-23 DIAGNOSIS — R918 Other nonspecific abnormal finding of lung field: Secondary | ICD-10-CM

## 2014-07-23 DIAGNOSIS — R161 Splenomegaly, not elsewhere classified: Secondary | ICD-10-CM

## 2014-07-23 DIAGNOSIS — D509 Iron deficiency anemia, unspecified: Secondary | ICD-10-CM

## 2014-07-23 DIAGNOSIS — N289 Disorder of kidney and ureter, unspecified: Secondary | ICD-10-CM

## 2014-07-23 HISTORY — PX: TRANSTHORACIC ECHOCARDIOGRAM: SHX275

## 2014-07-23 LAB — CBC
HCT: 25 % — ABNORMAL LOW (ref 36.0–46.0)
Hemoglobin: 7.6 g/dL — ABNORMAL LOW (ref 12.0–15.0)
MCH: 23.1 pg — ABNORMAL LOW (ref 26.0–34.0)
MCHC: 30.4 g/dL (ref 30.0–36.0)
MCV: 76 fL — ABNORMAL LOW (ref 78.0–100.0)
Platelets: 181 10*3/uL (ref 150–400)
RBC: 3.29 MIL/uL — ABNORMAL LOW (ref 3.87–5.11)
RDW: 16.9 % — ABNORMAL HIGH (ref 11.5–15.5)
WBC: 4.2 10*3/uL (ref 4.0–10.5)

## 2014-07-23 LAB — BASIC METABOLIC PANEL
Anion gap: 8 (ref 5–15)
BUN: 25 mg/dL — ABNORMAL HIGH (ref 6–20)
CO2: 29 mmol/L (ref 22–32)
Calcium: 8.7 mg/dL — ABNORMAL LOW (ref 8.9–10.3)
Chloride: 100 mmol/L — ABNORMAL LOW (ref 101–111)
Creatinine, Ser: 1 mg/dL (ref 0.44–1.00)
GFR calc Af Amer: >60 mL/min (ref >60)
GFR calc non Af Amer: 52 mL/min — ABNORMAL LOW (ref >60)
Glucose, Bld: 101 mg/dL — ABNORMAL HIGH (ref 65–99)
Potassium: 3.8 mmol/L (ref 3.5–5.1)
Sodium: 137 mmol/L (ref 135–145)

## 2014-07-23 LAB — LACTATE DEHYDROGENASE: LDH: 192 U/L (ref 98–192)

## 2014-07-23 LAB — OCCULT BLOOD X 1 CARD TO LAB, STOOL: Fecal Occult Bld: NEGATIVE

## 2014-07-23 LAB — PREPARE RBC (CROSSMATCH)

## 2014-07-23 MED ORDER — IOHEXOL 300 MG/ML  SOLN
25.0000 mL | INTRAMUSCULAR | Status: AC
  Administered 2014-07-23 (×2): 25 mL via ORAL

## 2014-07-23 MED ORDER — LEVOTHYROXINE SODIUM 100 MCG PO TABS
100.0000 ug | ORAL_TABLET | Freq: Every day | ORAL | Status: DC
  Administered 2014-07-24: 100 ug via ORAL
  Filled 2014-07-23: qty 1

## 2014-07-23 MED ORDER — FUROSEMIDE 10 MG/ML IJ SOLN
20.0000 mg | Freq: Once | INTRAMUSCULAR | Status: AC
  Administered 2014-07-24: 20 mg via INTRAVENOUS
  Filled 2014-07-23: qty 2

## 2014-07-23 MED ORDER — IOHEXOL 300 MG/ML  SOLN
100.0000 mL | Freq: Once | INTRAMUSCULAR | Status: AC | PRN
  Administered 2014-07-23: 100 mL via INTRAVENOUS

## 2014-07-23 MED ORDER — SODIUM CHLORIDE 0.9 % IV SOLN
Freq: Once | INTRAVENOUS | Status: AC
  Administered 2014-07-23: via INTRAVENOUS

## 2014-07-23 NOTE — Consult Note (Signed)
New Hematology/Oncology Consult   Referral MD: Dr. Broadus John        Reason for Referral: Anemia, lymphadenopathy    HPI: She reports a 2 week history of progressive dyspnea. She also reports orthopnea. She presented to the emergency room yesterday and was found to have severe anemia.  A chest x-ray revealed a 9 mm left mid lung nodule. A CT of the chest revealed multiple bilateral lung nodules, mediastinal/hilar lymphadenopathy, neck and retroperitoneal lymphadenopathy. A CT of the abdomen and pelvis revealed renal nodules concerning for metastases, splenomegaly, and extensive abdominal lymphadenopathy.  She reports feeling well prior to 2 weeks ago.    Past Medical History  Diagnosis Date  . Vitamin B 12 deficiency   . HTN (hypertension)   . Vitamin D deficiency   . HLD (hyperlipidemia)   .  G8 P8        :  Past Surgical History  Procedure Laterality Date  . Breast biopsy      .  C-section    Current facility-administered medications:  .  0.9 %  sodium chloride infusion, 250 mL, Intravenous, PRN, Ripudeep K Rai, MD .  acetaminophen (TYLENOL) tablet 650 mg, 650 mg, Oral, Q4H PRN, Ripudeep K Rai, MD .  aspirin chewable tablet 81 mg, 81 mg, Oral, Daily, Ripudeep K Rai, MD, 81 mg at 07/23/14 0951 .  carvedilol (COREG) tablet 3.125 mg, 3.125 mg, Oral, BID WC, Ripudeep K Rai, MD, 3.125 mg at 07/23/14 1724 .  heparin injection 5,000 Units, 5,000 Units, Subcutaneous, 3 times per day, Ripudeep Krystal Eaton, MD, 5,000 Units at 07/23/14 1509 .  [START ON 07/24/2014] levothyroxine (SYNTHROID, LEVOTHROID) tablet 100 mcg, 100 mcg, Oral, QAC breakfast, Domenic Polite, MD .  omega-3 acid ethyl esters (LOVAZA) capsule 2 g, 2 g, Oral, Daily, Ripudeep K Rai, MD, 2 g at 07/23/14 2025 .  ondansetron (ZOFRAN) injection 4 mg, 4 mg, Intravenous, Q6H PRN, Ripudeep K Rai, MD .  pravastatin (PRAVACHOL) tablet 80 mg, 80 mg, Oral, Daily, Ripudeep K Rai, MD, 80 mg at 07/23/14 0952 .  sodium chloride 0.9 %  injection 3 mL, 3 mL, Intravenous, Q12H, Ripudeep K Rai, MD, 3 mL at 07/23/14 0953 .  sodium chloride 0.9 % injection 3 mL, 3 mL, Intravenous, PRN, Ripudeep K Rai, MD:  . aspirin  81 mg Oral Daily  . carvedilol  3.125 mg Oral BID WC  . heparin  5,000 Units Subcutaneous 3 times per day  . [START ON 07/24/2014] levothyroxine  100 mcg Oral QAC breakfast  . omega-3 acid ethyl esters  2 g Oral Daily  . pravastatin  80 mg Oral Daily  . sodium chloride  3 mL Intravenous Q12H  :  No Known Allergies:  Family History  Problem Relation Age of Onset  . Stomach cancer  father    . Melanoma  mother    . Lung disease    . Lymphoma-Hodgkin's   sister    . Diabetes    :  History   Social History  . Marital Status:  widow     Spouse Name: N/A  . Number of Children: 8  . Years of Education: N/A   Occupational History  .  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 Smoking status: Never Smoker   . Smokeless tobacco: Not on file  . Alcohol Use: No  . Drug Use: Not on file  . Sexual Activity: Not on file  She lives alone. She does not use tobacco or alcohol.   Review of Systems:  Positives include: Exertional dyspnea, orthopnea  A complete ROS was otherwise negative.   Physical Exam:  Blood pressure 117/100, pulse 83, temperature 98.1 F (36.7 C), temperature source Oral, resp. rate 20, height _0  (1.575 m), weight 179 lb 8 oz (81.421 kg), SpO2 97 %.  HEENT: Edentulous, oral cavity without visible mass, neck without mass Lungs: Clear bilaterally Cardiac: Regular rate and rhythm Abdomen: No hepatomegaly, nontender, no mass  Vascular: Trace edema at the low leg bilaterally Lymph nodes: Soft mobile 1 cm left scalene, 1.5 cm right submandibular, less than 1 cm right low posterior cervical, 2 cm right axillary nodes Neurologic: Alert and oriented, the motor is and appears intact in the upper and lower extremities Skin: Multiple benign-appearing moles over  the trunk Musculoskeletal: No spine tenderness  LABS:   Recent Labs  07/22/14 1200 07/23/14 0357  WBC 4.7 4.2  HGB 8.5* 7.6*  HCT 27.8* 25.0*  PLT 202 181   MCV 76   Recent Labs  07/22/14 0830 07/22/14 1200 07/23/14 0357  NA 137  --  137  K 4.6  --  3.8  CL 101  --  100*  CO2 26  --  29  GLUCOSE 109*  --  101*  BUN 26*  --  25*  CREATININE 0.96 0.93 1.00  CALCIUM 8.9  --  8.7*    ferritin 44, vitamin B-12 1001, LDH 192   RADIOLOGY:  Ct Angio Chest Pe W/cm &/or Wo Cm  07/22/2014   CLINICAL DATA:  81 year old female with worsening shortness of breath x2 weeks.  EXAM: CT ANGIOGRAPHY CHEST WITH CONTRAST  TECHNIQUE: Multidetector CT imaging of the chest was performed using the standard protocol during bolus administration of intravenous contrast. Multiplanar CT image reconstructions and MIPs were obtained to evaluate the vascular anatomy.  CONTRAST:  127m OMNIPAQUE IOHEXOL 350 MG/ML SOLN  COMPARISON:  Chest radiograph dated 07/22/2014.  FINDINGS: There there  Lungs, pleural spaces, and central airways: There are innumerable bilateral pulmonary nodules compatible with metastatic disease. A nodule or Crosser of adjacent nodule in the lingula along the major fissure measures 1.0 x 1.2 cm (series 5, image 162). Trace bilateral pleural effusions.  Vasculature: Mild atherosclerotic calcification of the aorta. No CT evidence of pulmonary embolism. No thoracic aortic aneurysm or dissection.  Heart: Mild cardiomegaly. No pericardial effusion. There is coronary vascular calcification.  Mediastinum/ lymph nodes: There are bilateral hilar adenopathy. Left hilar lymph node measures to 1.8 x 1.9 cm. There is subcarinal and anterior mediastinal adenopathy. Right paratracheal adenopathy measures up to 1.3 centimeter short axis. Right axillary adenopathy measures up to 1.6 x 1.7 centimeter. Multiple top-normal left axillary and supraclavicular lymph nodes noted. There is submandibular  adenopathy.  Chest wall/ musculoskeletal: Degenerative changes of the spine. There is sclerotic changes and osteophyte formation of the inferior endplate of the TC58vertebra. This level is not completely included in the images but likely represent chronic and degenerative changes. No acute fracture. A subcentimeter round right breast calcific focus as well as a 1 centimeter round nodular density noted in the right breast. Correlation with clinical exam and mammography recommended.  Upper abdomen: Moderate size hiatal hernia partially visualized soft tissue density in the region of the porta hepaticus which may represent cluster of enlarged lymph node. Soft tissue mass arising from the pancreas not excluded. Contrast enhanced CT of the abdomen and pelvis is recommended  further evaluation. Retroperitoneal adenopathy noted in the upper abdomen.  Review of the MIP images confirms the above findings.  IMPRESSION: No CT evidence of pulmonary embolism.  Innumerable bilateral pulmonary metastatic nodules. Trace bilateral pleural effusions.  Bilateral hilar and mediastinal adenopathy. Right axillary, submandibular, supraclavicular, and retroperitoneal adenopathy.  Partially visualized ill-defined soft tissue density in the porta pedis may represent clusters of enlarged lymph nodes or a mass arising from the head of the pancreas. CT of the abdomen pelvis with contrast is recommended for better evaluation.   Electronically Signed   By: Anner Crete M.D.   On: 07/22/2014 18:07   Ct Abdomen Pelvis W Contrast  07/23/2014   CLINICAL DATA:  Shortness of breath, pulmonary metastases of unknown source, CHF  EXAM: CT ABDOMEN AND PELVIS WITH CONTRAST  TECHNIQUE: Multidetector CT imaging of the abdomen and pelvis was performed using the standard protocol following bolus administration of intravenous contrast. Sagittal and coronal MPR images reconstructed from axial data set.  CONTRAST:  167mL OMNIPAQUE IOHEXOL 300 MG/ML SOLN IV.  Dilute oral contrast.  COMPARISON:  CT chest 07/22/2014, CT pelvis 01/30/2010  FINDINGS: Numerous bibasilar pulmonary nodules.  Moderate to large hiatal hernia.  9 mm subtle hypervascular nodule lateral segment LEFT lobe liver image 13.  2.4 cm calcified gallstone in gallbladder.  BILATERAL renal cortical soft tissue nodules likely metastatic disease.  Splenic enlargement, 12.1 x 6.5 x 15.5 cm.  Remainder of liver, spleen, pancreas, and adrenal glands normal.  Extensive upper abdominal adenopathy including retrocrural, peripancreatic, periportal, celiac, aortocaval, para-aortic, mesenteric, and and BILATERAL iliac.  15 mm short axis retrocrural node image 16.  20 mm short axis LEFT para-aortic node image 32.  17 mm short axis periaortic node image 38.  Stomach and small bowel loops unremarkable.  Atrophic uterus with unremarkable ovaries.  Normal appendix.  Minimal sigmoid diverticulosis.  No additional mass, free air, free fluid or hernia.  Bones diffusely demineralized with inferior endplate compression fracture of T12 again seen.  IMPRESSION: Pulmonary metastases.  Extensive abdominal adenopathy.  Mild splenic enlargement.  9 mm hypervascular liver lesion.  Probable BILATERAL renal metastases.  Overall findings are most consistent with lymphoma.  Cholelithiasis.  Hiatal hernia.  Age-indeterminate T12 compression fracture, cannot exclude pathologic fracture.   Electronically Signed   By: Lavonia Dana M.D.   On: 07/23/2014 14:24   Dg Chest Portable 1 View  07/22/2014   CLINICAL DATA:  Intermittent shortness of breath for 3 weeks  EXAM: PORTABLE CHEST - 1 VIEW  COMPARISON:  None.  FINDINGS: There is a 9 x 9 mm nodular opacity in the left mid lung region. There is bibasilar interstitial edema. Lungs elsewhere clear. Heart is mildly enlarged with pulmonary vascularity within normal limits. No adenopathy. No bone lesions.  IMPRESSION: 9 x 9 mm nodular opacity left mid lung region. This nodular opacity potentially  could represent a nipple shadow. Repeat study with nipple markers advised. If this structure does not represent a nipple shadow, noncontrast enhanced chest CT to further evaluate would be warranted.  Bibasilar interstitial edema with mild cardiomegaly. Findings are most consistent with a degree of congestive heart failure. There is no airspace consolidation.   Electronically Signed   By: Lowella Grip III M.D.   On: 07/22/2014 08:38    Assessment and Plan:   1. Dyspnea-likely secondary to severe anemia 2. Neck, chest, abdominal lymphadenopathy, and splenomegaly 3. CT scan evidence of metastatic lung and renal nodules 4. History of vitamin B 12 deficiency 5.  Microcytic anemia  Ms. Gutierrez presents with dyspnea. She has severe anemia. I suspect the dyspnea is related to anemia. The clinical presentation is consistent with a diagnosis of lymphoma (low-grade non-Hodgkin's lymphoma or CLL), though the differential diagnosis includes a metastatic carcinoma.  Her 82th birthday is tomorrow and she would like to be home for a planned party on 07/25/2014.  I discussed the differential diagnosis with Ms. Heinlen and her daughter.  Recommendations: 1. Transfuse 2 units of packed red blood cells 2. Diagnostic bone marrow biopsy 3. Serum protein electrophoresis/immunofixation and quantitative immunoglobulin levels 4.. Outpatient follow-up at the [facility] during the week of 07/27/2014  Please call oncology as needed while she is here.    [name], MD 07/23/2014, 5:40 PM

## 2014-07-23 NOTE — Progress Notes (Signed)
CARDIAC REHAB PHASE I   PRE:  Rate/Rhythm: 90 SR PVCs  BP:  Supine:   Sitting: 100/78  Standing:    SaO2: 93%RA  MODE:  Ambulation: 250 ft   POST:  Rate/Rhythm: 98-103 ST PVCs  BP:  Supine:   Sitting: 102/60  Standing:    SaO2: 99-100%RA 1350-1450 Pt walked 250 ft on RA with asst x 1 with steady gait. No c/o dizziness. Some slight DOE noted but tolerated well. To sitting on side of bed after walk. Reviewed zones from CHF booklet which had already been reviewed by other staff. Gave low sodium handout and discussed 2000 mg restriction. Discussed importance of daily weights and when to call MD. Encouraged walking as tolerated with family at home. Education with family and pt who were very receptive.   Graylon Good, RN BSN  07/23/2014 2:51 PM

## 2014-07-23 NOTE — Progress Notes (Signed)
*  PRELIMINARY RESULTS* Echocardiogram 2D Echocardiogram has been performed.  Leavy Cella 07/23/2014, 12:33 PM

## 2014-07-23 NOTE — Progress Notes (Addendum)
[facility] PROGRESS NOTE  Amanda Sellers JJK:[id number] DOB: [birth year] DOA: 07/22/2014 PCP: Florina Ou, MD  Assessment/Plan: 1. Dyspnea on exertion, suspect due to anemia, mild diastolic CHF, mets -improving, but BP now in 90s, will hold diuretics, ACE -FU ECHO -Follow I/Os, weights -will transfuse within the next 24hours  2. Numerous Pulm mets/primary unknown, also has iron defi anemia -h/o colonoscopy within 5-10years, with possibly [facility], requested records -check CT abd pelvis, CEA and Ca 19-9, if this is unrevealing, will ask GI to eval, mother expired from stomach Ca  3. Iron defi anemia -related to #1, as above, hemoccult pending -will transfuse if Hb <7 -start Iron, pending above workup  4. HTN -Bp soft, now, holding diuretics/HCTZ and ACE, continue coreg with holding parameters  5. Hypothyroidism -TSH 8.6, will increase Synthroid to 160mcg  DVT proph: Hep SQ, stop this if hemoccult positive  Code Status: Full Code Family Communication: daughter at bedside Disposition Plan: inpatient     HPI/Subjective: Breathing better  Objective: Filed Vitals:   07/23/14 0630  BP: 94/52  Pulse: 56  Temp:   Resp:     Intake/Output Summary (Last 24 hours) at 07/23/14 1231 Last data filed at 07/23/14 3716  Gross per 24 hour  Intake    300 ml  Output   2100 ml  Net  -1800 ml   Filed Weights   07/22/14 1128 07/23/14 0551  Weight: 78.518 kg (173 lb 1.6 oz) 81.421 kg (179 lb 8 oz)    Exam:   General:  AAOx3, pleasant, no distress  HEENT: enlarged subclav nodes  Cardiovascular: S1S2/RRR  Respiratory: CTAB  Abdomen: soft, NT, splenomegaly, BS present  Musculoskeletal: trace edema  Data Reviewed: Basic Metabolic Panel:  Recent Labs Lab 07/22/14 0830 07/22/14 1200 07/23/14 0357  NA 137  --  137  K 4.6  --  3.8  CL 101  --  100*  CO2 26  --  29  GLUCOSE 109*  --  101*  BUN 26*  --  25*  CREATININE 0.96 0.93 1.00  CALCIUM 8.9  --   8.7*   Liver Function Tests: No results for input(s): AST, ALT, ALKPHOS, BILITOT, PROT, ALBUMIN in the last 168 hours. No results for input(s): LIPASE, AMYLASE in the last 168 hours. No results for input(s): AMMONIA in the last 168 hours. CBC:  Recent Labs Lab 07/22/14 0830 07/22/14 1200 07/23/14 0357  WBC 4.3 4.7 4.2  HGB 8.3* 8.5* 7.6*  HCT 27.3* 27.8* 25.0*  MCV 75.6* 75.7* 76.0*  PLT 190 202 181   Cardiac Enzymes:  Recent Labs Lab 07/22/14 1220 07/22/14 1642 07/22/14 2312  TROPONINI <0.03 <0.03 <0.03   BNP (last 3 results)  Recent Labs  07/22/14 0830  BNP 282.2*    ProBNP (last 3 results) No results for input(s): PROBNP in the last 8760 hours.  CBG: No results for input(s): GLUCAP in the last 168 hours.  No results found for this or any previous visit (from the past 240 hour(s)).   Studies: Ct Angio Chest Pe W/cm &/or Wo Cm  07/22/2014   CLINICAL DATA:  81 year old female with worsening shortness of breath x2 weeks.  EXAM: CT ANGIOGRAPHY CHEST WITH CONTRAST  TECHNIQUE: Multidetector CT imaging of the chest was performed using the standard protocol during bolus administration of intravenous contrast. Multiplanar CT image reconstructions and MIPs were obtained to evaluate the vascular anatomy.  CONTRAST:  146mL OMNIPAQUE IOHEXOL 350 MG/ML SOLN  COMPARISON:  Chest radiograph dated  07/22/2014.  FINDINGS: There there  Lungs, pleural spaces, and central airways: There are innumerable bilateral pulmonary nodules compatible with metastatic disease. A nodule or Crosser of adjacent nodule in the lingula along the major fissure measures 1.0 x 1.2 cm (series 5, image 162). Trace bilateral pleural effusions.  Vasculature: Mild atherosclerotic calcification of the aorta. No CT evidence of pulmonary embolism. No thoracic aortic aneurysm or dissection.  Heart: Mild cardiomegaly. No pericardial effusion. There is coronary vascular calcification.  Mediastinum/ lymph nodes:  There are bilateral hilar adenopathy. Left hilar lymph node measures to 1.8 x 1.9 cm. There is subcarinal and anterior mediastinal adenopathy. Right paratracheal adenopathy measures up to 1.3 centimeter short axis. Right axillary adenopathy measures up to 1.6 x 1.7 centimeter. Multiple top-normal left axillary and supraclavicular lymph nodes noted. There is submandibular adenopathy.  Chest wall/ musculoskeletal: Degenerative changes of the spine. There is sclerotic changes and osteophyte formation of the inferior endplate of the H60 vertebra. This level is not completely included in the images but likely represent chronic and degenerative changes. No acute fracture. A subcentimeter round right breast calcific focus as well as a 1 centimeter round nodular density noted in the right breast. Correlation with clinical exam and mammography recommended.  Upper abdomen: Moderate size hiatal hernia partially visualized soft tissue density in the region of the porta hepaticus which may represent cluster of enlarged lymph node. Soft tissue mass arising from the pancreas not excluded. Contrast enhanced CT of the abdomen and pelvis is recommended further evaluation. Retroperitoneal adenopathy noted in the upper abdomen.  Review of the MIP images confirms the above findings.  IMPRESSION: No CT evidence of pulmonary embolism.  Innumerable bilateral pulmonary metastatic nodules. Trace bilateral pleural effusions.  Bilateral hilar and mediastinal adenopathy. Right axillary, submandibular, supraclavicular, and retroperitoneal adenopathy.  Partially visualized ill-defined soft tissue density in the porta pedis may represent clusters of enlarged lymph nodes or a mass arising from the head of the pancreas. CT of the abdomen pelvis with contrast is recommended for better evaluation.   Electronically Signed   By: Anner Crete M.D.   On: 07/22/2014 18:07   Dg Chest Portable 1 View  07/22/2014   CLINICAL DATA:  Intermittent  shortness of breath for 3 weeks  EXAM: PORTABLE CHEST - 1 VIEW  COMPARISON:  None.  FINDINGS: There is a 9 x 9 mm nodular opacity in the left mid lung region. There is bibasilar interstitial edema. Lungs elsewhere clear. Heart is mildly enlarged with pulmonary vascularity within normal limits. No adenopathy. No bone lesions.  IMPRESSION: 9 x 9 mm nodular opacity left mid lung region. This nodular opacity potentially could represent a nipple shadow. Repeat study with nipple markers advised. If this structure does not represent a nipple shadow, noncontrast enhanced chest CT to further evaluate would be warranted.  Bibasilar interstitial edema with mild cardiomegaly. Findings are most consistent with a degree of congestive heart failure. There is no airspace consolidation.   Electronically Signed   By: Lowella Grip III M.D.   On: 07/22/2014 08:38    Scheduled Meds: . aspirin  81 mg Oral Daily  . carvedilol  3.125 mg Oral BID WC  . heparin  5,000 Units Subcutaneous 3 times per day  . levothyroxine  75 mcg Oral QAC breakfast  . omega-3 acid ethyl esters  2 g Oral Daily  . pravastatin  80 mg Oral Daily  . sodium chloride  3 mL Intravenous Q12H   Continuous Infusions:  Antibiotics Given (last  72 hours)    None      Principal Problem:   Dyspnea Active Problems:   Anemia   CHF (congestive heart failure)   Hypertension   Hyperlipidemia    Time spent: 74min    Amanda Sellers  [facility] Pager [phone]. If 7PM-7AM, please contact night-coverage at [email or web address], password [email or web address] 07/23/2014, 12:31 PM  LOS: 1 day

## 2014-07-23 NOTE — Progress Notes (Addendum)
Spoke with patient and daughter at bedside. Patient states that her adult son has lived with her since her husband died five years ago. She is still working outside the home. She has a 3 in 1, cane, and a walker at the house that her husband used prior to passing that she does not use at this time. She denies needing any assistance obtaining her medications. Will follow for discharge needs.

## 2014-07-23 NOTE — Progress Notes (Signed)
Baltazar Najjar NP paged regarding pt's BP of 94/52, Pulse 56 manually.

## 2014-07-23 NOTE — Progress Notes (Signed)
Pt woke up during the night, complained of some difficulty breathing. RN assess pt, lungs sound clear, O2 Sat 100% on room air, pt denies chest pain. Pt was placed on 2L of O2 nasal cannula with head of bed raised for comfort.

## 2014-07-24 ENCOUNTER — Other Ambulatory Visit (HOSPITAL_COMMUNITY): Payer: Medicare HMO

## 2014-07-24 ENCOUNTER — Encounter (HOSPITAL_COMMUNITY): Payer: Self-pay | Admitting: Internal Medicine

## 2014-07-24 ENCOUNTER — Inpatient Hospital Stay (HOSPITAL_COMMUNITY): Payer: Medicare HMO

## 2014-07-24 ENCOUNTER — Other Ambulatory Visit: Payer: Self-pay | Admitting: *Deleted

## 2014-07-24 DIAGNOSIS — R591 Generalized enlarged lymph nodes: Secondary | ICD-10-CM

## 2014-07-24 DIAGNOSIS — C7800 Secondary malignant neoplasm of unspecified lung: Secondary | ICD-10-CM

## 2014-07-24 DIAGNOSIS — I5031 Acute diastolic (congestive) heart failure: Principal | ICD-10-CM

## 2014-07-24 HISTORY — DX: Secondary malignant neoplasm of unspecified lung: C78.00

## 2014-07-24 HISTORY — PX: BONE MARROW BIOPSY: SHX199

## 2014-07-24 LAB — IMMUNOFIXATION ELECTROPHORESIS
IgA: 210 mg/dL (ref 64–422)
IgG (Immunoglobin G), Serum: 683 mg/dL — ABNORMAL LOW (ref 700–1600)
IgM, Serum: 76 mg/dL (ref 26–217)
Total Protein ELP: 5.9 g/dL — ABNORMAL LOW (ref 6.0–8.5)

## 2014-07-24 LAB — CBC
HCT: 33.4 % — ABNORMAL LOW (ref 36.0–46.0)
Hemoglobin: 10.5 g/dL — ABNORMAL LOW (ref 12.0–15.0)
MCH: 24.2 pg — ABNORMAL LOW (ref 26.0–34.0)
MCHC: 31.4 g/dL (ref 30.0–36.0)
MCV: 77 fL — ABNORMAL LOW (ref 78.0–100.0)
Platelets: 198 10*3/uL (ref 150–400)
RBC: 4.34 MIL/uL (ref 3.87–5.11)
RDW: 16.6 % — ABNORMAL HIGH (ref 11.5–15.5)
WBC: 4.4 10*3/uL (ref 4.0–10.5)

## 2014-07-24 LAB — COMPREHENSIVE METABOLIC PANEL
ALT: 10 U/L — ABNORMAL LOW (ref 14–54)
AST: 16 U/L (ref 15–41)
Albumin: 2.8 g/dL — ABNORMAL LOW (ref 3.5–5.0)
Alkaline Phosphatase: 92 U/L (ref 38–126)
Anion gap: 10 (ref 5–15)
BUN: 29 mg/dL — ABNORMAL HIGH (ref 6–20)
CO2: 32 mmol/L (ref 22–32)
Calcium: 9.1 mg/dL (ref 8.9–10.3)
Chloride: 95 mmol/L — ABNORMAL LOW (ref 101–111)
Creatinine, Ser: 1.09 mg/dL — ABNORMAL HIGH (ref 0.44–1.00)
GFR calc Af Amer: 54 mL/min — ABNORMAL LOW (ref >60)
GFR calc non Af Amer: 47 mL/min — ABNORMAL LOW (ref >60)
Glucose, Bld: 117 mg/dL — ABNORMAL HIGH (ref 65–99)
Potassium: 4 mmol/L (ref 3.5–5.1)
Sodium: 137 mmol/L (ref 135–145)
Total Bilirubin: 1 mg/dL (ref 0.3–1.2)
Total Protein: 6.2 g/dL — ABNORMAL LOW (ref 6.5–8.1)

## 2014-07-24 LAB — IGG, IGA, IGM
IgA: 212 mg/dL (ref 64–422)
IgG (Immunoglobin G), Serum: 637 mg/dL — ABNORMAL LOW (ref 700–1600)
IgM, Serum: 77 mg/dL (ref 26–217)

## 2014-07-24 LAB — CANCER ANTIGEN 19-9: CA 19-9: 3 U/mL (ref 0–35)

## 2014-07-24 LAB — PROTEIN ELECTROPHORESIS, SERUM
A/G Ratio: 0.8 (ref 0.7–1.7)
Albumin ELP: 2.7 g/dL — ABNORMAL LOW (ref 2.9–4.4)
Alpha-1-Globulin: 0.4 g/dL (ref 0.0–0.4)
Alpha-2-Globulin: 1.1 g/dL — ABNORMAL HIGH (ref 0.4–1.0)
Beta Globulin: 1.1 g/dL (ref 0.7–1.3)
Gamma Globulin: 0.6 g/dL (ref 0.4–1.8)
Globulin, Total: 3.2 g/dL (ref 2.2–3.9)
Total Protein ELP: 5.9 g/dL — ABNORMAL LOW (ref 6.0–8.5)

## 2014-07-24 LAB — PROTIME-INR
INR: 0.99 (ref 0.00–1.49)
Prothrombin Time: 13.3 seconds (ref 11.6–15.2)

## 2014-07-24 LAB — APTT: aPTT: 32 seconds (ref 24–37)

## 2014-07-24 LAB — BONE MARROW EXAM

## 2014-07-24 LAB — SAVE SMEAR

## 2014-07-24 LAB — CEA: CEA: 0.8 ng/mL (ref 0.0–4.7)

## 2014-07-24 MED ORDER — LISINOPRIL 40 MG PO TABS: 20.0000 mg | ORAL_TABLET | Freq: Every day | ORAL | Status: DC

## 2014-07-24 MED ORDER — FENTANYL CITRATE (PF) 100 MCG/2ML IJ SOLN
INTRAMUSCULAR | Status: AC | PRN
  Administered 2014-07-24: 50 ug via INTRAVENOUS

## 2014-07-24 MED ORDER — MIDAZOLAM HCL 2 MG/2ML IJ SOLN
INTRAMUSCULAR | Status: AC
  Filled 2014-07-24: qty 2

## 2014-07-24 MED ORDER — LEVOTHYROXINE SODIUM 100 MCG PO TABS: 100.0000 ug | ORAL_TABLET | Freq: Every day | ORAL | Status: DC

## 2014-07-24 MED ORDER — LIDOCAINE HCL 1 % IJ SOLN
INTRAMUSCULAR | Status: AC
  Filled 2014-07-24: qty 20

## 2014-07-24 MED ORDER — MIDAZOLAM HCL 2 MG/2ML IJ SOLN
INTRAMUSCULAR | Status: AC | PRN
  Administered 2014-07-24: 1 mg via INTRAVENOUS

## 2014-07-24 MED ORDER — FENTANYL CITRATE (PF) 100 MCG/2ML IJ SOLN
INTRAMUSCULAR | Status: AC
  Filled 2014-07-24: qty 2

## 2014-07-24 NOTE — Procedures (Signed)
Successful RT ILIAC BM ASP AND CORE BX NO COMP STABLE PATH PENDING FULL REPORT IN PACS

## 2014-07-24 NOTE — Progress Notes (Signed)
   07/24/14 1400  Clinical Encounter Type  Visited With Patient and family together;Health care provider  Visit Type Initial  Advance Directives (For Healthcare)  Does patient have an advance directive? Yes  Type of Paramedic of [place];Living will   Chaplain was paged to patient's room to facilitate the completion of the patient's advanced directive. Chaplain assisted patient in going through the living will portion of the document. Chaplain facilitated finalizing and signing of advanced directive. Family now has copies of finished document and a copy has been placed in patient's chart.  Gar Ponto, Chaplain  2:04 PM

## 2014-07-24 NOTE — Consult Note (Signed)
Chief Complaint: Chief Complaint  Patient presents with  . Shortness of Breath  progressive dyspnea Anemia Lymphadenopathy   Referring Physician(s): Dr Benay Spice  History of Present Illness: Amanda Sellers is a 81 y.o. female   Presented with shortness of breath Progressive dyspnea Work up revealed anemia B lung nodules; B renal nodules; mediastinal LAN; neck and RP LAN Consulted with Dr Benay Spice Scheduled now for bone marrow biopsy---concerning for lymphoma/malignancy I have seen and examined pt  Past Medical History  Diagnosis Date  . Vitamin B 12 deficiency   . HTN (hypertension)   . Vitamin D deficiency   . HLD (hyperlipidemia)   . CHF (congestive heart failure)     Patient states " maybe now"    Past Surgical History  Procedure Laterality Date  . Breast biopsy      Allergies: Review of patient's allergies indicates no known allergies.  Medications: Prior to Admission medications   Medication Sig Start Date End Date Taking? Authorizing Provider  amLODipine (NORVASC) 2.5 MG tablet Take 2.5 mg by mouth daily.   Yes Historical Provider, MD  aspirin 81 MG tablet Take 81 mg by mouth daily.   Yes Historical Provider, MD  ergocalciferol (VITAMIN D2) 50000 UNITS capsule Take 50,000 Units by mouth once a week. On Sundays   Yes Historical Provider, MD  fish oil-omega-3 fatty acids 1000 MG capsule Take 2 g by mouth daily.   Yes Historical Provider, MD  hydrochlorothiazide (HYDRODIURIL) 25 MG tablet Take 25 mg by mouth daily.   Yes Historical Provider, MD  levothyroxine (SYNTHROID, LEVOTHROID) 75 MCG tablet Take 75 mcg by mouth daily before breakfast.   Yes Historical Provider, MD  lisinopril (PRINIVIL,ZESTRIL) 40 MG tablet Take 40 mg by mouth daily.   Yes Historical Provider, MD  pravastatin (PRAVACHOL) 80 MG tablet Take 80 mg by mouth daily.   Yes Historical Provider, MD     Family History  Problem Relation Age of Onset  . Stomach cancer    . Melanoma    .  Lung disease    . Lymphoma    . Diabetes      History   Social History  . Marital Status: Married    Spouse Name: N/A  . Number of Children: 8  . Years of Education: N/A   Occupational History  . retired    Social History Main Topics  . Smoking status: Never Smoker   . Smokeless tobacco: Not on file  . Alcohol Use: No  . Drug Use: Not on file  . Sexual Activity: Not on file   Other Topics Concern  . None   Social History Narrative     Review of Systems: A 12 point ROS discussed and pertinent positives are indicated in the HPI above.  All other systems are negative.  Review of Systems  Constitutional: Positive for fatigue. Negative for activity change.  Respiratory: Positive for shortness of breath. Negative for cough.   Cardiovascular: Negative for chest pain.  Gastrointestinal: Negative for nausea and abdominal pain.  Skin: Negative for color change.  Neurological: Positive for weakness.  Psychiatric/Behavioral: Negative for behavioral problems and confusion.    Vital Signs: BP 104/66 mmHg  Pulse 30  Temp(Src) 98.5 F (36.9 C) (Oral)  Resp 20  Ht '5\' 2"'  (1.575 m)  Wt 175 lb 4.3 oz (79.5 kg)  BMI 32.05 kg/m2  SpO2 96%  Physical Exam  Constitutional: She is oriented to person, place, and time. She appears well-nourished.  Cardiovascular:  Normal rate, regular rhythm and normal heart sounds.   No murmur heard. Pulmonary/Chest: Effort normal. She has wheezes.  Abdominal: Soft. Bowel sounds are normal. There is no tenderness.  Musculoskeletal: Normal range of motion.  Neurological: She is alert and oriented to person, place, and time.  Skin: Skin is warm and dry.  Psychiatric: She has a normal mood and affect. Her behavior is normal. Judgment and thought content normal.  Nursing note and vitals reviewed.   Mallampati Score:  MD Evaluation Airway: WNL Heart: WNL Abdomen: WNL Chest/ Lungs: WNL ASA  Classification: 3 Mallampati/Airway Score:  One  Imaging: Ct Angio Chest Pe W/cm &/or Wo Cm  07/22/2014   CLINICAL DATA:  81 year old female with worsening shortness of breath x2 weeks.  EXAM: CT ANGIOGRAPHY CHEST WITH CONTRAST  TECHNIQUE: Multidetector CT imaging of the chest was performed using the standard protocol during bolus administration of intravenous contrast. Multiplanar CT image reconstructions and MIPs were obtained to evaluate the vascular anatomy.  CONTRAST:  145m OMNIPAQUE IOHEXOL 350 MG/ML SOLN  COMPARISON:  Chest radiograph dated 07/22/2014.  FINDINGS: There there  Lungs, pleural spaces, and central airways: There are innumerable bilateral pulmonary nodules compatible with metastatic disease. A nodule or Crosser of adjacent nodule in the lingula along the major fissure measures 1.0 x 1.2 cm (series 5, image 162). Trace bilateral pleural effusions.  Vasculature: Mild atherosclerotic calcification of the aorta. No CT evidence of pulmonary embolism. No thoracic aortic aneurysm or dissection.  Heart: Mild cardiomegaly. No pericardial effusion. There is coronary vascular calcification.  Mediastinum/ lymph nodes: There are bilateral hilar adenopathy. Left hilar lymph node measures to 1.8 x 1.9 cm. There is subcarinal and anterior mediastinal adenopathy. Right paratracheal adenopathy measures up to 1.3 centimeter short axis. Right axillary adenopathy measures up to 1.6 x 1.7 centimeter. Multiple top-normal left axillary and supraclavicular lymph nodes noted. There is submandibular adenopathy.  Chest wall/ musculoskeletal: Degenerative changes of the spine. There is sclerotic changes and osteophyte formation of the inferior endplate of the [id number]. This level is not completely included in the images but likely represent chronic and degenerative changes. No acute fracture. A subcentimeter round right breast calcific focus as well as a 1 centimeter round nodular density noted in the right breast. Correlation with clinical exam and  mammography recommended.  Upper abdomen: Moderate size hiatal hernia partially visualized soft tissue density in the region of the porta hepaticus which may represent cluster of enlarged lymph node. Soft tissue mass arising from the pancreas not excluded. Contrast enhanced CT of the abdomen and pelvis is recommended further evaluation. Retroperitoneal adenopathy noted in the upper abdomen.  Review of the MIP images confirms the above findings.  IMPRESSION: No CT evidence of pulmonary embolism.  Innumerable bilateral pulmonary metastatic nodules. Trace bilateral pleural effusions.  Bilateral hilar and mediastinal adenopathy. Right axillary, submandibular, supraclavicular, and retroperitoneal adenopathy.  Partially visualized ill-defined soft tissue density in the porta pedis may represent clusters of enlarged lymph nodes or a mass arising from the head of the pancreas. CT of the abdomen pelvis with contrast is recommended for better evaluation.   Electronically Signed   By: AAnner CreteM.D.   On: 07/22/2014 18:07   Ct Abdomen Pelvis W Contrast  07/23/2014   CLINICAL DATA:  Shortness of breath, pulmonary metastases of unknown source, CHF  EXAM: CT ABDOMEN AND PELVIS WITH CONTRAST  TECHNIQUE: Multidetector CT imaging of the abdomen and pelvis was performed using the standard protocol following bolus administration  of intravenous contrast. Sagittal and coronal MPR images reconstructed from axial data set.  CONTRAST:  164m OMNIPAQUE IOHEXOL 300 MG/ML SOLN IV. Dilute oral contrast.  COMPARISON:  CT chest 07/22/2014, CT pelvis 01/30/2010  FINDINGS: Numerous bibasilar pulmonary nodules.  Moderate to large hiatal hernia.  9 mm subtle hypervascular nodule lateral segment LEFT lobe liver image 13.  2.4 cm calcified gallstone in gallbladder.  BILATERAL renal cortical soft tissue nodules likely metastatic disease.  Splenic enlargement, 12.1 x 6.5 x 15.5 cm.  Remainder of liver, spleen, pancreas, and adrenal glands  normal.  Extensive upper abdominal adenopathy including retrocrural, peripancreatic, periportal, celiac, aortocaval, para-aortic, mesenteric, and and BILATERAL iliac.  15 mm short axis retrocrural node image 16.  20 mm short axis LEFT para-aortic node image 32.  17 mm short axis periaortic node image 38.  Stomach and small bowel loops unremarkable.  Atrophic uterus with unremarkable ovaries.  Normal appendix.  Minimal sigmoid diverticulosis.  No additional mass, free air, free fluid or hernia.  Bones diffusely demineralized with inferior endplate compression fracture of T12 again seen.  IMPRESSION: Pulmonary metastases.  Extensive abdominal adenopathy.  Mild splenic enlargement.  9 mm hypervascular liver lesion.  Probable BILATERAL renal metastases.  Overall findings are most consistent with lymphoma.  Cholelithiasis.  Hiatal hernia.  Age-indeterminate T12 compression fracture, cannot exclude pathologic fracture.   Electronically Signed   By: MLavonia DanaM.D.   On: 07/23/2014 14:24   Dg Chest Portable 1 View  07/22/2014   CLINICAL DATA:  Intermittent shortness of breath for 3 weeks  EXAM: PORTABLE CHEST - 1 VIEW  COMPARISON:  None.  FINDINGS: There is a 9 x 9 mm nodular opacity in the left mid lung region. There is bibasilar interstitial edema. Lungs elsewhere clear. Heart is mildly enlarged with pulmonary vascularity within normal limits. No adenopathy. No bone lesions.  IMPRESSION: 9 x 9 mm nodular opacity left mid lung region. This nodular opacity potentially could represent a nipple shadow. Repeat study with nipple markers advised. If this structure does not represent a nipple shadow, noncontrast enhanced chest CT to further evaluate would be warranted.  Bibasilar interstitial edema with mild cardiomegaly. Findings are most consistent with a degree of congestive heart failure. There is no airspace consolidation.   Electronically Signed   By: WLowella GripIII M.D.   On: 07/22/2014 08:38     Labs:  CBC:  Recent Labs  07/22/14 0830 07/22/14 1200 07/23/14 0357 07/24/14 0757  WBC 4.3 4.7 4.2 4.4  HGB 8.3* 8.5* 7.6* 10.5*  HCT 27.3* 27.8* 25.0* 33.4*  PLT 190 202 181 198    COAGS:  Recent Labs  07/24/14 0757  INR 0.99  APTT 32    BMP:  Recent Labs  07/22/14 0830 07/22/14 1200 07/23/14 0357 07/24/14 0757  NA 137  --  137 137  K 4.6  --  3.8 4.0  CL 101  --  100* 95*  CO2 26  --  29 32  GLUCOSE 109*  --  101* 117*  BUN 26*  --  25* 29*  CALCIUM 8.9  --  8.7* 9.1  CREATININE 0.96 0.93 1.00 1.09*  GFRNONAA 55* 57* 52* 47*  GFRAA >60 >60 >60 54*    LIVER FUNCTION TESTS:  Recent Labs  07/24/14 0757  BILITOT 1.0  AST 16  ALT 10*  ALKPHOS 92  PROT 6.2*  ALBUMIN 2.8*    TUMOR MARKERS:  Recent Labs  07/23/14 0844  CEA 0.8  CA199 3  Assessment and Plan:  Dyspnea Diffuse lymphadenopathy Bone marrow bx request per Dr Benay Spice Risks and Benefits discussed with the patient including, but not limited to bleeding, infection, damage to adjacent structures or low yield requiring additional tests. All of the patient's questions were answered, patient is agreeable to proceed. Consent signed and in chart.   Thank you for this interesting consult.  I greatly enjoyed [place] and look forward to participating in their care.  Signed: Mort Smelser A 07/24/2014, 8:43 AM   I spent a total of 20 Minutes    in face to face in clinical consultation, greater than 50% of which was counseling/coordinating care for BM bx

## 2014-07-24 NOTE — Progress Notes (Signed)
Pt. Received discharge instructions and prescriptions with family at bedside. Educated pt. On follow-up appointments and importance of monitoring weight and input/output with new dx of CHF. Removed IV. No skin issues noted. All questions answered. No further needs noted at this time.

## 2014-07-24 NOTE — Progress Notes (Signed)
Utilization Review completed. Alberto Schoch RN BSN CM 

## 2014-07-24 NOTE — Discharge Summary (Signed)
Physician Discharge Summary  Amanda Sellers Amanda Sellers JJO:[id number] DOB: 1932/09/15 DOA: 07/22/2014  PCP: Amanda Ou, MD  Admit date: 07/22/2014 Discharge date: 07/24/2014  Time spent: 45 minutes  Recommendations for Outpatient Follow-up:  1. Dr.Sherril 7/6 at 12:30 2. Please check CBC in 1 week  Discharge Diagnoses:  Principal Problem:   Dyspnea   Pulmonary metastasis   Abdominal lymphadenopathy   Anemia-iron defi and chronic disease   CHF (congestive heart failure)   Hypertension   Hyperlipidemia   Lymphadenopathy   Discharge Condition: stable  Diet recommendation: low sodium  Filed Weights   07/22/14 1128 07/23/14 0551 07/24/14 0552  Weight: 78.518 kg (173 lb 1.6 oz) 81.421 kg (179 lb 8 oz) 79.5 kg (175 lb 4.3 oz)    History of present illness:  Chief Complaint: Shortness of breath with exertion, orthopnea, PND x 2 weeks HPI: Patient is a 81 year old female with hypothyroidism, hypertension, hyperlipidemia presented to ED with worsening shortness of breath over the past 2 weeks. Patient reported no chest pain but gradually worsening shortness of breath, has been sleeping on 2 pillows or sitting upright, sometimes difficult to breathe. She denied any wheezing, fevers or chills or any productive cough. She denies any prior history of coronary artery disease. Patient went to the urgent care a few weeks ago and was diagnosed with bronchitis, placed on antibiotics however symptoms did not completely improve. She denied any recent weight gain however has noticed swelling of her legs. Patient also had noticed left arm pain in the last 2 weeks which spontaneously resolved, had no associated chest pain, nausea, diaphoresis or any numbness or tingling in the arm. ED workup showed BNP of 282, BMET unremarkable, hemoglobin 8.3, troponin negative  Hospital Course:  1. Dyspnea on exertion,  -due to Anemia, mild acute diastolic CHF, pulmonary metastasis -improved, transfused 2units PRBC, was  also given 2 doses of IV lasix -continue PO HCTZ and low sodium diet -2D ECHO with normal EF, wall motion and grade 2 diastolic dysfunction  2. Numerous Pulm mets/diffuse abd lymphadenopathy/ anemia -underwent CT abd pelvis and CTA chest: Pulmonary metastases, Extensive abdominal adenopathy,  Mild splenic enlargement, Probable Bilateral renal metastases,  -Per Radiologist; Overall findings are most consistent with lymphoma -seen by Dr.Sherril in consultation, underwent Bone Marrow aspiration and biopsy -Serum protein electrophoresis/immunofixation and quantitative immunoglobulin levels sent and pending -FU arranged with Dr.Sherril for next week 7/6  3. Anemia -due to Iron deficiency, chronic disease -sp 2 Units PRBC transfusion -please recheck CBC at FU  4. HTN -resumed HCTZ and lisinopril  5. Hypothyroidism -TSH 8.6, will increase Synthroid to 191mcg  Procedures:  Bone marrow aspiration and biopsy  Consultations:  Oncology Dr.Sherril   Discharge Exam: Filed Vitals:   07/24/14 1302  BP: 106/49  Pulse: 84  Temp:   Resp:     General: AAOx3 Cardiovascular: S1S2/RRR Respiratory: CTAB  Discharge Instructions   Discharge Instructions    Diet - low sodium heart healthy    Complete by:  As directed      Increase activity slowly    Complete by:  As directed           Current Discharge Medication List    CONTINUE these medications which have CHANGED   Details  levothyroxine (SYNTHROID, LEVOTHROID) 100 MCG tablet Take 1 tablet (100 mcg total) by mouth daily before breakfast. Qty: 30 tablet, Refills: 0    lisinopril (PRINIVIL,ZESTRIL) 40 MG tablet Take 0.5 tablets (20 mg total) by mouth daily.  CONTINUE these medications which have NOT CHANGED   Details  amLODipine (NORVASC) 2.5 MG tablet Take 2.5 mg by mouth daily.    aspirin 81 MG tablet Take 81 mg by mouth daily.    ergocalciferol (VITAMIN D2) 50000 UNITS capsule Take 50,000 Units by mouth once a  week. On Sundays    fish oil-omega-3 fatty acids 1000 MG capsule Take 2 g by mouth daily.    hydrochlorothiazide (HYDRODIURIL) 25 MG tablet Take 25 mg by mouth daily.    pravastatin (PRAVACHOL) 80 MG tablet Take 80 mg by mouth daily.       No Known Allergies Follow-up Information    Follow up with [name], MD. Schedule an appointment as soon as possible for a visit in 1 week.   Specialty:  Oncology   Contact information:   [place] [address] [phone]       Follow up with Amanda Ou, MD. Schedule an appointment as soon as possible for a visit in 1 week.   Specialty:  Family Medicine   Contact information:   [address] [address] [phone]        The results of significant diagnostics from this hospitalization (including imaging, microbiology, ancillary and laboratory) are listed below for reference.    Significant Diagnostic Studies: Ct Angio Chest Pe W/cm &/or Wo Cm  07/22/2014   CLINICAL DATA:  81 year old female with worsening shortness of breath x2 weeks.  EXAM: CT ANGIOGRAPHY CHEST WITH CONTRAST  TECHNIQUE: Multidetector CT imaging of the chest was performed using the standard protocol during bolus administration of intravenous contrast. Multiplanar CT image reconstructions and MIPs were obtained to evaluate the vascular anatomy.  CONTRAST:  117mL OMNIPAQUE IOHEXOL 350 MG/ML SOLN  COMPARISON:  Chest radiograph dated 07/22/2014.  FINDINGS: There there  Lungs, pleural spaces, and central airways: There are innumerable bilateral pulmonary nodules compatible with metastatic disease. A nodule or Crosser of adjacent nodule in the lingula along the major fissure measures 1.0 x 1.2 cm (series 5, image 162). Trace bilateral pleural effusions.  Vasculature: Mild atherosclerotic calcification of the aorta. No CT evidence of pulmonary embolism. No thoracic aortic aneurysm or dissection.  Heart:  Mild cardiomegaly. No pericardial effusion. There is coronary vascular calcification.  Mediastinum/ lymph nodes: There are bilateral hilar adenopathy. Left hilar lymph node measures to 1.8 x 1.9 cm. There is subcarinal and anterior mediastinal adenopathy. Right paratracheal adenopathy measures up to 1.3 centimeter short axis. Right axillary adenopathy measures up to 1.6 x 1.7 centimeter. Multiple top-normal left axillary and supraclavicular lymph nodes noted. There is submandibular adenopathy.  Chest wall/ musculoskeletal: Degenerative changes of the spine. There is sclerotic changes and osteophyte formation of the inferior endplate of the M54 vertebra. This level is not completely included in the images but likely represent chronic and degenerative changes. No acute fracture. A subcentimeter round right breast calcific focus as well as a 1 centimeter round nodular density noted in the right breast. Correlation with clinical exam and mammography recommended.  Upper abdomen: Moderate size hiatal hernia partially visualized soft tissue density in the region of the porta hepaticus which may represent cluster of enlarged lymph node. Soft tissue mass arising from the pancreas not excluded. Contrast enhanced CT of the abdomen and pelvis is recommended further evaluation. Retroperitoneal adenopathy noted in the upper abdomen.  Review of the MIP images confirms the above findings.  IMPRESSION: No CT evidence of pulmonary embolism.  Innumerable  bilateral pulmonary metastatic nodules. Trace bilateral pleural effusions.  Bilateral hilar and mediastinal adenopathy. Right axillary, submandibular, supraclavicular, and retroperitoneal adenopathy.  Partially visualized ill-defined soft tissue density in the porta pedis may represent clusters of enlarged lymph nodes or a mass arising from the head of the pancreas. CT of the abdomen pelvis with contrast is recommended for better evaluation.   Electronically Signed   By: Anner Crete M.D.   On: 07/22/2014 18:07   Ct Abdomen Pelvis W Contrast  07/23/2014   CLINICAL DATA:  Shortness of breath, pulmonary metastases of unknown source, CHF  EXAM: CT ABDOMEN AND PELVIS WITH CONTRAST  TECHNIQUE: Multidetector CT imaging of the abdomen and pelvis was performed using the standard protocol following bolus administration of intravenous contrast. Sagittal and coronal MPR images reconstructed from axial data set.  CONTRAST:  18mL OMNIPAQUE IOHEXOL 300 MG/ML SOLN IV. Dilute oral contrast.  COMPARISON:  CT chest 07/22/2014, CT pelvis 01/30/2010  FINDINGS: Numerous bibasilar pulmonary nodules.  Moderate to large hiatal hernia.  9 mm subtle hypervascular nodule lateral segment LEFT lobe liver image 13.  2.4 cm calcified gallstone in gallbladder.  BILATERAL renal cortical soft tissue nodules likely metastatic disease.  Splenic enlargement, 12.1 x 6.5 x 15.5 cm.  Remainder of liver, spleen, pancreas, and adrenal glands normal.  Extensive upper abdominal adenopathy including retrocrural, peripancreatic, periportal, celiac, aortocaval, para-aortic, mesenteric, and and BILATERAL iliac.  15 mm short axis retrocrural node image 16.  20 mm short axis LEFT para-aortic node image 32.  17 mm short axis periaortic node image 38.  Stomach and small bowel loops unremarkable.  Atrophic uterus with unremarkable ovaries.  Normal appendix.  Minimal sigmoid diverticulosis.  No additional mass, free air, free fluid or hernia.  Bones diffusely demineralized with inferior endplate compression fracture of T12 again seen.  IMPRESSION: Pulmonary metastases.  Extensive abdominal adenopathy.  Mild splenic enlargement.  9 mm hypervascular liver lesion.  Probable BILATERAL renal metastases.  Overall findings are most consistent with lymphoma.  Cholelithiasis.  Hiatal hernia.  Age-indeterminate T12 compression fracture, cannot exclude pathologic fracture.   Electronically Signed   By: Lavonia Dana M.D.   On: 07/23/2014 14:24    Ct Biopsy  07/24/2014   CLINICAL DATA:  Anemia, CLL  EXAM: CT GUIDED RIGHT ILIAC BONE MARROW ASPIRATION AND CORE BIOPSY  Date:  7/1/20167/01/2014 10:57 am  Radiologist:  M. Daryll Brod, MD  Guidance:  CT  FLUOROSCOPY TIME:  None.  MEDICATIONS AND MEDICAL HISTORY: 1 mg Versed, 50 mcg fentanyl  ANESTHESIA/SEDATION: 10 minutes  CONTRAST:  None.  COMPLICATIONS: None  PROCEDURE: Informed consent was obtained from the patient following explanation of the procedure, risks, benefits and alternatives. The patient understands, agrees and consents for the procedure. All questions were addressed. A time out was performed.  The patient was positioned prone and noncontrast localization CT was performed of the pelvis to demonstrate the iliac marrow spaces.  Maximal barrier sterile technique utilized including caps, mask, sterile gowns, sterile gloves, large sterile drape, hand hygiene, and betadine prep.  Under sterile conditions and local anesthesia, an 11 gauge coaxial bone biopsy needle was advanced into the right iliac marrow space. Needle position was confirmed with CT imaging. Initially, bone marrow aspiration was performed. Next, the 11 gauge outer cannula was utilized to obtain a right iliac bone marrow core biopsy. Needle was removed. Hemostasis was obtained with compression. The patient tolerated the procedure well. Samples were prepared with the cytotechnologist. No immediate complications.  IMPRESSION: CT guided  right iliac bone marrow aspiration and core biopsy.   Electronically Signed   By: Jerilynn Mages.  Shick M.D.   On: 07/24/2014 11:12   Dg Chest Portable 1 View  07/22/2014   CLINICAL DATA:  Intermittent shortness of breath for 3 weeks  EXAM: PORTABLE CHEST - 1 VIEW  COMPARISON:  None.  FINDINGS: There is a 9 x 9 mm nodular opacity in the left mid lung region. There is bibasilar interstitial edema. Lungs elsewhere clear. Heart is mildly enlarged with pulmonary vascularity within normal limits. No adenopathy. No bone  lesions.  IMPRESSION: 9 x 9 mm nodular opacity left mid lung region. This nodular opacity potentially could represent a nipple shadow. Repeat study with nipple markers advised. If this structure does not represent a nipple shadow, noncontrast enhanced chest CT to further evaluate would be warranted.  Bibasilar interstitial edema with mild cardiomegaly. Findings are most consistent with a degree of congestive heart failure. There is no airspace consolidation.   Electronically Signed   By: Lowella Grip III M.D.   On: 07/22/2014 08:38    Microbiology: No results found for this or any previous visit (from the past 240 hour(s)).   Labs: Basic Metabolic Panel:  Recent Labs Lab 07/22/14 0830 07/22/14 1200 07/23/14 0357 07/24/14 0757  NA 137  --  137 137  K 4.6  --  3.8 4.0  CL 101  --  100* 95*  CO2 26  --  29 32  GLUCOSE 109*  --  101* 117*  BUN 26*  --  25* 29*  CREATININE 0.96 0.93 1.00 1.09*  CALCIUM 8.9  --  8.7* 9.1   Liver Function Tests:  Recent Labs Lab 07/24/14 0757  AST 16  ALT 10*  ALKPHOS 92  BILITOT 1.0  PROT 6.2*  ALBUMIN 2.8*   No results for input(s): LIPASE, AMYLASE in the last 168 hours. No results for input(s): AMMONIA in the last 168 hours. CBC:  Recent Labs Lab 07/22/14 0830 07/22/14 1200 07/23/14 0357 07/24/14 0757  WBC 4.3 4.7 4.2 4.4  HGB 8.3* 8.5* 7.6* 10.5*  HCT 27.3* 27.8* 25.0* 33.4*  MCV 75.6* 75.7* 76.0* 77.0*  PLT 190 202 181 198   Cardiac Enzymes:  Recent Labs Lab 07/22/14 1220 07/22/14 1642 07/22/14 2312  TROPONINI <0.03 <0.03 <0.03   BNP: BNP (last 3 results)  Recent Labs  07/22/14 0830  BNP 282.2*    ProBNP (last 3 results) No results for input(s): PROBNP in the last 8760 hours.  CBG: No results for input(s): GLUCAP in the last 168 hours.     SignedDomenic Polite  [facility] 07/24/2014, 1:44 PM

## 2014-07-24 NOTE — Clinical Documentation Improvement (Signed)
Supporting Information: Dyspnea likely due to Acute CHF with clinical symptoms, + JVD, CXR, and elevated BNP per 6/29 progress notes.                                      (Acute CHF codes to unspecified CHF).   DOE, suspect due to anemia, mild Diastolic CHF, mets per 6/38 progress notes.  Labs: 6/29:  BNP: 282.2.   Possible Clinical Condition: . Document acuity --Acute --Chronic --Acute on Chronic . Document type --Diastolic --Systolic --Combined systolic and diastolic . Due to or associated with --Cardiac or other surgery --Hypertension --Valvular disease --Rheumatic heart disease Endocarditis (valvitis) Pericarditis Myocarditis --Other (specify)     Thank Sherian Maroon Documentation Specialist [phone] [email or web address]

## 2014-07-25 LAB — TYPE AND SCREEN
ABO/RH(D): AB POS
Antibody Screen: POSITIVE
DAT, IgG: NEGATIVE
Donor AG Type: NEGATIVE
Donor AG Type: NEGATIVE
PT AG Type: NEGATIVE
Unit division: 0
Unit division: 0

## 2014-07-29 ENCOUNTER — Encounter: Payer: Self-pay | Admitting: Oncology

## 2014-07-29 ENCOUNTER — Telehealth: Payer: Self-pay | Admitting: *Deleted

## 2014-07-29 ENCOUNTER — Telehealth: Payer: Self-pay | Admitting: Oncology

## 2014-07-29 ENCOUNTER — Ambulatory Visit (HOSPITAL_BASED_OUTPATIENT_CLINIC_OR_DEPARTMENT_OTHER): Payer: Medicare HMO | Admitting: Oncology

## 2014-07-29 ENCOUNTER — Ambulatory Visit: Payer: Medicare HMO | Admitting: Oncology

## 2014-07-29 ENCOUNTER — Ambulatory Visit (HOSPITAL_BASED_OUTPATIENT_CLINIC_OR_DEPARTMENT_OTHER): Payer: Medicare HMO

## 2014-07-29 ENCOUNTER — Ambulatory Visit: Payer: Medicare HMO

## 2014-07-29 VITALS — BP 107/47 | HR 81 | Temp 98.2°F | Resp 18 | Ht 62.0 in | Wt 172.1 lb

## 2014-07-29 DIAGNOSIS — C8290 Follicular lymphoma, unspecified, unspecified site: Secondary | ICD-10-CM | POA: Diagnosis not present

## 2014-07-29 DIAGNOSIS — R161 Splenomegaly, not elsewhere classified: Secondary | ICD-10-CM | POA: Diagnosis not present

## 2014-07-29 DIAGNOSIS — R0609 Other forms of dyspnea: Secondary | ICD-10-CM

## 2014-07-29 DIAGNOSIS — D509 Iron deficiency anemia, unspecified: Secondary | ICD-10-CM

## 2014-07-29 DIAGNOSIS — R918 Other nonspecific abnormal finding of lung field: Secondary | ICD-10-CM

## 2014-07-29 DIAGNOSIS — R591 Generalized enlarged lymph nodes: Secondary | ICD-10-CM | POA: Diagnosis not present

## 2014-07-29 DIAGNOSIS — E279 Disorder of adrenal gland, unspecified: Secondary | ICD-10-CM

## 2014-07-29 LAB — CBC WITH DIFFERENTIAL/PLATELET
BASO%: 0.3 % (ref 0.0–2.0)
Basophils Absolute: 0 10*3/uL (ref 0.0–0.1)
EOS%: 1.1 % (ref 0.0–7.0)
Eosinophils Absolute: 0.1 10*3/uL (ref 0.0–0.5)
HCT: 32.7 % — ABNORMAL LOW (ref 34.8–46.6)
HGB: 10.3 g/dL — ABNORMAL LOW (ref 11.6–15.9)
LYMPH%: 9.2 % — ABNORMAL LOW (ref 14.0–49.7)
MCH: 23.9 pg — ABNORMAL LOW (ref 25.1–34.0)
MCHC: 31.4 g/dL — ABNORMAL LOW (ref 31.5–36.0)
MCV: 76.2 fL — ABNORMAL LOW (ref 79.5–101.0)
MONO#: 0.7 10*3/uL (ref 0.1–0.9)
MONO%: 14 % (ref 0.0–14.0)
NEUT#: 3.7 10*3/uL (ref 1.5–6.5)
NEUT%: 75.4 % (ref 38.4–76.8)
Platelets: 209 10*3/uL (ref 145–400)
RBC: 4.3 10*6/uL (ref 3.70–5.45)
RDW: 19.2 % — ABNORMAL HIGH (ref 11.2–14.5)
WBC: 4.9 10*3/uL (ref 3.9–10.3)
lymph#: 0.4 10*3/uL — ABNORMAL LOW (ref 0.9–3.3)

## 2014-07-29 LAB — COMPREHENSIVE METABOLIC PANEL (CC13)
ALT: 11 U/L (ref 0–55)
AST: 15 U/L (ref 5–34)
Albumin: 2.8 g/dL — ABNORMAL LOW (ref 3.5–5.0)
Alkaline Phosphatase: 91 U/L (ref 40–150)
Anion Gap: 11 mEq/L (ref 3–11)
BUN: 24.1 mg/dL (ref 7.0–26.0)
CO2: 28 mEq/L (ref 22–29)
Calcium: 10.2 mg/dL (ref 8.4–10.4)
Chloride: 101 mEq/L (ref 98–109)
Creatinine: 0.8 mg/dL (ref 0.6–1.1)
EGFR: 66 mL/min/{1.73_m2} — ABNORMAL LOW (ref >90)
Glucose: 92 mg/dl (ref 70–140)
Potassium: 4.1 mEq/L (ref 3.5–5.1)
Sodium: 139 mEq/L (ref 136–145)
Total Bilirubin: 0.54 mg/dL (ref 0.20–1.20)
Total Protein: 6.4 g/dL (ref 6.4–8.3)

## 2014-07-29 LAB — HEPATITIS B SURFACE ANTIGEN: Hepatitis B Surface Ag: NEGATIVE

## 2014-07-29 LAB — HEPATITIS B CORE ANTIBODY, TOTAL: Hep B Core Total Ab: NONREACTIVE

## 2014-07-29 MED ORDER — FERROUS SULFATE 325 (65 FE) MG PO TABS: 325.0000 mg | ORAL_TABLET | Freq: Two times a day (BID) | ORAL | Status: DC

## 2014-07-29 NOTE — Telephone Encounter (Signed)
Per staff message and POF I have scheduled appts. Advised scheduler of appts. JMW  

## 2014-07-29 NOTE — Telephone Encounter (Signed)
Pt confirmed labs/ov per 07/06 POF, gave pt AVS and Calendar.... KJ, sent msg to add chemo NP.Marland Kitchen

## 2014-07-29 NOTE — Progress Notes (Signed)
Checked in new pt with no financial concerns.  Pt has my card for any billing questions or concerns. ° °

## 2014-07-29 NOTE — Telephone Encounter (Signed)
Per Dr. Benay Spice; notified Jenny Reichmann pt's daughter (ROI on file) MD wants her to start ferrous sulfate 325mg  twice a day.  [name] verbalized understanding and states "we'll get them today"

## 2014-07-29 NOTE — Progress Notes (Signed)
[facility] OFFICE PROGRESS NOTE   Diagnosis: Non-Hodgkin's lymphoma  INTERVAL HISTORY:   I saw Amanda Sellers when she was hospitalized last week with severe symptomatic anemia. She was transfused with 2 units of packed red blood cells. She reports feeling partially improved after the red cell transfusion. She continues to have intermittent dyspnea when supine. No exertional dyspnea. She underwent diagnostic bone marrow biopsy on 07/24/2014. The pathology confirmed a hypercellular bone marrow with involvement by non-Hodgkin's B-cell lymphoma. Flow cytometry confirmed an abnormal B-cell population expressing CD10 and CD20. The bone marrow biopsy had paratrabecular lymphoid aggregates mostly composed of small lymphoid cells with a minor population of larger lymphocytes. CD5 and cyclin D1 stains were negative. Storage iron was present but "scant ".  She otherwise feels well. She denies bleeding. No difficulty with bowel function. She reports undergoing a colonoscopy at [facility] within the past few years. Objective:  Vital signs in last 24 hours:  Blood pressure 107/47, pulse 81, temperature 98.2 F (36.8 C), temperature source Oral, resp. rate 18, height _0  (1.575 m), weight 172 lb 1.6 oz (78.064 kg), SpO2 97 %.    Lymphatics: Soft mobile bilateral cervical nodes, 1-2 cm mobile right axillary node, 170 left axillary node Resp: Coarse rhonchi at the left upper posterior chest, no respiratory distress Cardio: Regular rate and rhythm GI: No hepatosplenomegaly, soft and nontender, no mass Vascular: No leg edema   Lab Results:  Lab Results  Component Value Date   WBC 4.4 07/24/2014   HGB 10.5* 07/24/2014   HCT 33.4* 07/24/2014   MCV 77.0* 07/24/2014   PLT 198 07/24/2014    Medications: I have reviewed the patient's current medications.  Assessment/Plan:  1. Non-Hodgkin's Lymphoma-bone marrow biopsy 07/24/2014 consistent with involvement by follicular B-cell  lymphoma, CD20 positive  CTs of the chest 07/22/2014 and CT of the abdomen and pelvis on 07/23/2014-pulmonary nodules, hilar/mediastinal/supraclavicular/axillary adenopathy, splenomegaly, abdominal adenopathy, bilateral adrenal nodules  2. Severe microcytic anemia, status post transfusion with packed red blood cells 07/23/2014  Normal ferritin, low transferrin saturation, "scant "bone marrow iron stores  3. Exertional dyspnea secondary to anemia and potentially related to lung lesions  4.  History of a B-12 deficiency  Disposition:  Amanda Sellers has been diagnosed with advanced stage non-Hodgkin's lymphoma. It is difficult to grade the lymphoma off of the bone marrow biopsy, but the clinical presentation is most consistent with a low-grade follicular lymphoma. The lung nodules and renal lesions are most likely related to lymphoma, but it is possible she has another malignancy. She presented with microcytic anemia, a low transferrin saturation, and a normal ferritin level. The bone marrow iron stores are diminished. We will follow-up on the recent colonoscopy from [facility]. The anemia is most likely secondary to "anemia of chronic disease ". I will review the peripheral blood smear when she returns for labs in the next few days. She will begin a trial of iron therapy.  I discussed treatment options for the lymphoma was Pella and her family. Since the anemia is most likely secondary to lymphoma we decided to proceed with systemic therapy. I recommend bendamustine/rituximab. I reviewed the potential toxicities associated with this regimen including the chance for nausea/vomiting, mucositis, diarrhea, alopecia, and hematologic toxicity. We discussed the allergic reaction, reactivation of hepatitis, and leukoencephalopathy  seen with rituximab. We also discussed the chance of pneumonitis and atypical infections. She agrees to proceed.  The plan is to begin a first cycle of bendamustine/rituximab  on 08/04/2014.  She will return for an office visit and nadir CBC on 08/18/2014.  Approximate 40 minutes were spent with the patient today. The majority of the time was used for counseling and coordination of care.  [name], MD  07/29/2014  12:59 PM

## 2014-07-30 ENCOUNTER — Other Ambulatory Visit: Payer: Medicare HMO

## 2014-08-02 ENCOUNTER — Other Ambulatory Visit: Payer: Self-pay | Admitting: Oncology

## 2014-08-04 ENCOUNTER — Ambulatory Visit (HOSPITAL_BASED_OUTPATIENT_CLINIC_OR_DEPARTMENT_OTHER): Payer: Medicare HMO

## 2014-08-04 ENCOUNTER — Encounter: Payer: Self-pay | Admitting: Pharmacist

## 2014-08-04 VITALS — BP 107/53 | HR 87 | Temp 97.9°F | Resp 18

## 2014-08-04 DIAGNOSIS — Z5111 Encounter for antineoplastic chemotherapy: Secondary | ICD-10-CM

## 2014-08-04 DIAGNOSIS — Z5112 Encounter for antineoplastic immunotherapy: Secondary | ICD-10-CM

## 2014-08-04 DIAGNOSIS — C8290 Follicular lymphoma, unspecified, unspecified site: Secondary | ICD-10-CM | POA: Diagnosis not present

## 2014-08-04 MED ORDER — ACETAMINOPHEN 325 MG PO TABS
650.0000 mg | ORAL_TABLET | Freq: Once | ORAL | Status: AC
  Administered 2014-08-04: 650 mg via ORAL

## 2014-08-04 MED ORDER — PROCHLORPERAZINE MALEATE 5 MG PO TABS: 5.0000 mg | ORAL_TABLET | Freq: Four times a day (QID) | ORAL | Status: DC | PRN

## 2014-08-04 MED ORDER — ACETAMINOPHEN 325 MG PO TABS
ORAL_TABLET | ORAL | Status: AC
  Filled 2014-08-04: qty 2

## 2014-08-04 MED ORDER — DIPHENHYDRAMINE HCL 25 MG PO CAPS
50.0000 mg | ORAL_CAPSULE | Freq: Once | ORAL | Status: AC
  Administered 2014-08-04: 50 mg via ORAL

## 2014-08-04 MED ORDER — DIPHENHYDRAMINE HCL 25 MG PO CAPS
ORAL_CAPSULE | ORAL | Status: AC
  Filled 2014-08-04: qty 1

## 2014-08-04 MED ORDER — SODIUM CHLORIDE 0.9 % IV SOLN
70.0000 mg/m2 | Freq: Once | INTRAVENOUS | Status: AC
  Administered 2014-08-04: 125 mg via INTRAVENOUS
  Filled 2014-08-04: qty 5

## 2014-08-04 MED ORDER — SODIUM CHLORIDE 0.9 % IV SOLN
Freq: Once | INTRAVENOUS | Status: AC
  Administered 2014-08-04: 10:00:00 via INTRAVENOUS

## 2014-08-04 MED ORDER — SODIUM CHLORIDE 0.9 % IV SOLN
375.0000 mg/m2 | Freq: Once | INTRAVENOUS | Status: AC
  Administered 2014-08-04: 700 mg via INTRAVENOUS
  Filled 2014-08-04: qty 70

## 2014-08-04 MED ORDER — SODIUM CHLORIDE 0.9 % IV SOLN
500.0000 mL | Freq: Once | INTRAVENOUS | Status: AC
  Administered 2014-08-04: 500 mL via INTRAVENOUS

## 2014-08-04 MED ORDER — SODIUM CHLORIDE 0.9 % IV SOLN
Freq: Once | INTRAVENOUS | Status: AC
  Administered 2014-08-04: 10:00:00 via INTRAVENOUS
  Filled 2014-08-04: qty 4

## 2014-08-04 NOTE — Patient Instructions (Signed)
[facility] Discharge Instructions for Patients Receiving Chemotherapy  Today you received the following chemotherapy agents: Treanda, Rituxan. To help prevent nausea and vomiting after your treatment, we encourage you to take your nausea medication.   If you develop nausea and vomiting that is not controlled by your nausea medication, call the clinic.   BELOW ARE SYMPTOMS THAT SHOULD BE REPORTED IMMEDIATELY:  *FEVER GREATER THAN 100.5 F  *CHILLS WITH OR WITHOUT FEVER  NAUSEA AND VOMITING THAT IS NOT CONTROLLED WITH YOUR NAUSEA MEDICATION  *UNUSUAL SHORTNESS OF BREATH  *UNUSUAL BRUISING OR BLEEDING  TENDERNESS IN MOUTH AND THROAT WITH OR WITHOUT PRESENCE OF ULCERS  *URINARY PROBLEMS  *BOWEL PROBLEMS  UNUSUAL RASH Items with * indicate a potential emergency and should be followed up as soon as possible.  Feel free to call the clinic you have any questions or concerns. The clinic phone number is [phone].  Please show the [facility] at check-in to the Emergency Department and triage nurse.

## 2014-08-04 NOTE — Progress Notes (Signed)
Noted pt with hypotension prior to starting Rituxan. Patient is on medication for hypertension, but held medication this morning. Pt is asymptomatic. Notifed Dr. Benay Spice. Orders received for 500cc fluid bolus with Rituxan treatment.

## 2014-08-05 ENCOUNTER — Telehealth: Payer: Self-pay | Admitting: *Deleted

## 2014-08-05 ENCOUNTER — Ambulatory Visit (HOSPITAL_BASED_OUTPATIENT_CLINIC_OR_DEPARTMENT_OTHER): Payer: Medicare HMO

## 2014-08-05 VITALS — BP 138/59 | HR 94 | Temp 97.7°F | Resp 20

## 2014-08-05 DIAGNOSIS — C8290 Follicular lymphoma, unspecified, unspecified site: Secondary | ICD-10-CM

## 2014-08-05 DIAGNOSIS — Z5111 Encounter for antineoplastic chemotherapy: Secondary | ICD-10-CM

## 2014-08-05 LAB — CHROMOSOME ANALYSIS, BONE MARROW

## 2014-08-05 MED ORDER — SODIUM CHLORIDE 0.9 % IV SOLN
Freq: Once | INTRAVENOUS | Status: AC
  Administered 2014-08-05: 09:00:00 via INTRAVENOUS
  Filled 2014-08-05: qty 4

## 2014-08-05 MED ORDER — SODIUM CHLORIDE 0.9 % IV SOLN
Freq: Once | INTRAVENOUS | Status: AC
  Administered 2014-08-05: 08:00:00 via INTRAVENOUS

## 2014-08-05 MED ORDER — SODIUM CHLORIDE 0.9 % IV SOLN
70.0000 mg/m2 | Freq: Once | INTRAVENOUS | Status: AC
  Administered 2014-08-05: 125 mg via INTRAVENOUS
  Filled 2014-08-05: qty 5

## 2014-08-05 NOTE — Telephone Encounter (Signed)
-----   Message from Amanda Sellers sent at 08/04/2014  2:48 PM EDT ----- Regarding: Dr. Benay Spice chemo followup call Dr. Benay Spice 1st Rituxan/Bendeka Tolerated well

## 2014-08-05 NOTE — Telephone Encounter (Signed)
Called pt, reviewed side effect management. She voiced appropriate antiemetic instructions. She is pushing fluids. Denied any needs at this time. Pt understands to call office as needed.

## 2014-08-05 NOTE — Patient Instructions (Signed)
[facility] Discharge Instructions for Patients Receiving Chemotherapy  Today you received the following chemotherapy agents: Bendamustine   To help prevent nausea and vomiting after your treatment, we encourage you to take your nausea medication as prescribed.    If you develop nausea and vomiting that is not controlled by your nausea medication, call the clinic.   BELOW ARE SYMPTOMS THAT SHOULD BE REPORTED IMMEDIATELY:  *FEVER GREATER THAN 100.5 F  *CHILLS WITH OR WITHOUT FEVER  NAUSEA AND VOMITING THAT IS NOT CONTROLLED WITH YOUR NAUSEA MEDICATION  *UNUSUAL SHORTNESS OF BREATH  *UNUSUAL BRUISING OR BLEEDING  TENDERNESS IN MOUTH AND THROAT WITH OR WITHOUT PRESENCE OF ULCERS  *URINARY PROBLEMS  *BOWEL PROBLEMS  UNUSUAL RASH Items with * indicate a potential emergency and should be followed up as soon as possible.  Feel free to call the clinic you have any questions or concerns. The clinic phone number is [phone].  Please show the CHEMO ALERT CARD at check-in to the Emergency Department and triage nurse.   

## 2014-08-06 ENCOUNTER — Encounter: Payer: Self-pay | Admitting: Oncology

## 2014-08-06 NOTE — Progress Notes (Signed)
I placed fmla forms for daughter Shirlean Mylar on desk of nurse for dr. Benay Spice

## 2014-08-07 ENCOUNTER — Inpatient Hospital Stay (HOSPITAL_COMMUNITY)
Admission: EM | Admit: 2014-08-07 | Discharge: 2014-08-09 | DRG: 291 | Disposition: A | Discharge status: Home / Self Care | Payer: Medicare HMO | Attending: Internal Medicine | Admitting: Internal Medicine

## 2014-08-07 ENCOUNTER — Encounter: Payer: Self-pay | Admitting: Oncology

## 2014-08-07 ENCOUNTER — Emergency Department (HOSPITAL_COMMUNITY): Payer: Medicare HMO

## 2014-08-07 ENCOUNTER — Encounter (HOSPITAL_COMMUNITY): Payer: Self-pay

## 2014-08-07 ENCOUNTER — Other Ambulatory Visit: Payer: Self-pay

## 2014-08-07 DIAGNOSIS — C8290 Follicular lymphoma, unspecified, unspecified site: Secondary | ICD-10-CM | POA: Diagnosis not present

## 2014-08-07 DIAGNOSIS — Z79899 Other long term (current) drug therapy: Secondary | ICD-10-CM

## 2014-08-07 DIAGNOSIS — C8280 Other types of follicular lymphoma, unspecified site: Secondary | ICD-10-CM | POA: Diagnosis present

## 2014-08-07 DIAGNOSIS — C8590 Non-Hodgkin lymphoma, unspecified, unspecified site: Secondary | ICD-10-CM | POA: Diagnosis not present

## 2014-08-07 DIAGNOSIS — D509 Iron deficiency anemia, unspecified: Secondary | ICD-10-CM | POA: Diagnosis present

## 2014-08-07 DIAGNOSIS — Z7982 Long term (current) use of aspirin: Secondary | ICD-10-CM | POA: Diagnosis not present

## 2014-08-07 DIAGNOSIS — I10 Essential (primary) hypertension: Secondary | ICD-10-CM | POA: Diagnosis present

## 2014-08-07 DIAGNOSIS — E538 Deficiency of other specified B group vitamins: Secondary | ICD-10-CM | POA: Diagnosis present

## 2014-08-07 DIAGNOSIS — D61818 Other pancytopenia: Secondary | ICD-10-CM | POA: Diagnosis present

## 2014-08-07 DIAGNOSIS — R195 Other fecal abnormalities: Secondary | ICD-10-CM | POA: Diagnosis present

## 2014-08-07 DIAGNOSIS — Z807 Family history of other malignant neoplasms of lymphoid, hematopoietic and related tissues: Secondary | ICD-10-CM

## 2014-08-07 DIAGNOSIS — D649 Anemia, unspecified: Secondary | ICD-10-CM | POA: Diagnosis not present

## 2014-08-07 DIAGNOSIS — I5031 Acute diastolic (congestive) heart failure: Secondary | ICD-10-CM

## 2014-08-07 DIAGNOSIS — I272 Other secondary pulmonary hypertension: Secondary | ICD-10-CM | POA: Diagnosis present

## 2014-08-07 DIAGNOSIS — R0609 Other forms of dyspnea: Secondary | ICD-10-CM | POA: Diagnosis not present

## 2014-08-07 DIAGNOSIS — E785 Hyperlipidemia, unspecified: Secondary | ICD-10-CM | POA: Diagnosis present

## 2014-08-07 DIAGNOSIS — Z9221 Personal history of antineoplastic chemotherapy: Secondary | ICD-10-CM | POA: Diagnosis not present

## 2014-08-07 DIAGNOSIS — I5033 Acute on chronic diastolic (congestive) heart failure: Secondary | ICD-10-CM | POA: Diagnosis present

## 2014-08-07 DIAGNOSIS — E559 Vitamin D deficiency, unspecified: Secondary | ICD-10-CM | POA: Diagnosis present

## 2014-08-07 DIAGNOSIS — I5032 Chronic diastolic (congestive) heart failure: Secondary | ICD-10-CM

## 2014-08-07 DIAGNOSIS — T451X5A Adverse effect of antineoplastic and immunosuppressive drugs, initial encounter: Secondary | ICD-10-CM | POA: Diagnosis present

## 2014-08-07 DIAGNOSIS — I5042 Chronic combined systolic (congestive) and diastolic (congestive) heart failure: Secondary | ICD-10-CM

## 2014-08-07 DIAGNOSIS — C7800 Secondary malignant neoplasm of unspecified lung: Secondary | ICD-10-CM | POA: Diagnosis present

## 2014-08-07 DIAGNOSIS — D61810 Antineoplastic chemotherapy induced pancytopenia: Secondary | ICD-10-CM | POA: Diagnosis present

## 2014-08-07 DIAGNOSIS — J9601 Acute respiratory failure with hypoxia: Secondary | ICD-10-CM | POA: Diagnosis present

## 2014-08-07 DIAGNOSIS — R0600 Dyspnea, unspecified: Secondary | ICD-10-CM

## 2014-08-07 DIAGNOSIS — Z808 Family history of malignant neoplasm of other organs or systems: Secondary | ICD-10-CM | POA: Diagnosis not present

## 2014-08-07 DIAGNOSIS — Z800 Family history of malignant neoplasm of digestive organs: Secondary | ICD-10-CM

## 2014-08-07 DIAGNOSIS — Z833 Family history of diabetes mellitus: Secondary | ICD-10-CM | POA: Diagnosis not present

## 2014-08-07 LAB — TROPONIN I: Troponin I: <0.03 ng/mL (ref <0.031)

## 2014-08-07 LAB — CBC WITH DIFFERENTIAL/PLATELET
Basophils Absolute: 0 10*3/uL (ref 0.0–0.1)
Basophils Relative: 0 % (ref 0–1)
Eosinophils Absolute: 0 10*3/uL (ref 0.0–0.7)
Eosinophils Relative: 1 % (ref 0–5)
HCT: 29.3 % — ABNORMAL LOW (ref 36.0–46.0)
Hemoglobin: 8.9 g/dL — ABNORMAL LOW (ref 12.0–15.0)
Lymphocytes Relative: 12 % (ref 12–46)
Lymphs Abs: 0.4 10*3/uL — ABNORMAL LOW (ref 0.7–4.0)
MCH: 22.9 pg — ABNORMAL LOW (ref 26.0–34.0)
MCHC: 30.4 g/dL (ref 30.0–36.0)
MCV: 75.3 fL — ABNORMAL LOW (ref 78.0–100.0)
Monocytes Absolute: 0.4 10*3/uL (ref 0.1–1.0)
Monocytes Relative: 13 % — ABNORMAL HIGH (ref 3–12)
Neutro Abs: 2.6 10*3/uL (ref 1.7–7.7)
Neutrophils Relative %: 74 % (ref 43–77)
Platelets: 149 10*3/uL — ABNORMAL LOW (ref 150–400)
RBC: 3.89 MIL/uL (ref 3.87–5.11)
RDW: 17.8 % — ABNORMAL HIGH (ref 11.5–15.5)
WBC: 3.5 10*3/uL — ABNORMAL LOW (ref 4.0–10.5)

## 2014-08-07 LAB — BASIC METABOLIC PANEL
Anion gap: 8 (ref 5–15)
BUN: 35 mg/dL — ABNORMAL HIGH (ref 6–20)
CO2: 28 mmol/L (ref 22–32)
Calcium: 9.7 mg/dL (ref 8.9–10.3)
Chloride: 101 mmol/L (ref 101–111)
Creatinine, Ser: 0.86 mg/dL (ref 0.44–1.00)
GFR calc Af Amer: >60 mL/min (ref >60)
GFR calc non Af Amer: >60 mL/min (ref >60)
Glucose, Bld: 110 mg/dL — ABNORMAL HIGH (ref 65–99)
Potassium: 4.1 mmol/L (ref 3.5–5.1)
Sodium: 137 mmol/L (ref 135–145)

## 2014-08-07 LAB — POC OCCULT BLOOD, ED: Fecal Occult Bld: POSITIVE — AB

## 2014-08-07 LAB — PREPARE RBC (CROSSMATCH)

## 2014-08-07 LAB — BRAIN NATRIURETIC PEPTIDE: B Natriuretic Peptide: 379.6 pg/mL — ABNORMAL HIGH (ref 0.0–100.0)

## 2014-08-07 MED ORDER — ONDANSETRON HCL 4 MG/2ML IJ SOLN
4.0000 mg | Freq: Four times a day (QID) | INTRAMUSCULAR | Status: DC | PRN
  Administered 2014-08-07: 4 mg via INTRAVENOUS
  Filled 2014-08-07: qty 2

## 2014-08-07 MED ORDER — PRAVASTATIN SODIUM 40 MG PO TABS
80.0000 mg | ORAL_TABLET | Freq: Every day | ORAL | Status: DC
  Administered 2014-08-07 – 2014-08-09 (×3): 80 mg via ORAL
  Filled 2014-08-07 (×3): qty 2

## 2014-08-07 MED ORDER — LISINOPRIL 20 MG PO TABS
20.0000 mg | ORAL_TABLET | Freq: Every day | ORAL | Status: DC
  Administered 2014-08-07 – 2014-08-08 (×2): 20 mg via ORAL
  Filled 2014-08-07 (×2): qty 1

## 2014-08-07 MED ORDER — ENOXAPARIN SODIUM 40 MG/0.4ML ~~LOC~~ SOLN
40.0000 mg | SUBCUTANEOUS | Status: DC
  Administered 2014-08-07 – 2014-08-08 (×2): 40 mg via SUBCUTANEOUS
  Filled 2014-08-07 (×2): qty 0.4

## 2014-08-07 MED ORDER — OMEGA-3-ACID ETHYL ESTERS 1 G PO CAPS
2.0000 g | ORAL_CAPSULE | Freq: Every day | ORAL | Status: DC
  Administered 2014-08-07 – 2014-08-09 (×3): 2 g via ORAL
  Filled 2014-08-07 (×5): qty 2

## 2014-08-07 MED ORDER — SODIUM CHLORIDE 0.9 % IJ SOLN
3.0000 mL | Freq: Two times a day (BID) | INTRAMUSCULAR | Status: DC
  Administered 2014-08-07 – 2014-08-08 (×3): 3 mL via INTRAVENOUS

## 2014-08-07 MED ORDER — FUROSEMIDE 10 MG/ML IJ SOLN
20.0000 mg | Freq: Once | INTRAMUSCULAR | Status: AC
  Administered 2014-08-07: 20 mg via INTRAVENOUS
  Filled 2014-08-07: qty 4

## 2014-08-07 MED ORDER — FERROUS SULFATE 325 (65 FE) MG PO TABS
325.0000 mg | ORAL_TABLET | Freq: Two times a day (BID) | ORAL | Status: DC
  Administered 2014-08-07 – 2014-08-09 (×4): 325 mg via ORAL
  Filled 2014-08-07 (×4): qty 1

## 2014-08-07 MED ORDER — ACETAMINOPHEN 325 MG PO TABS: 650.0000 mg | ORAL_TABLET | Freq: Four times a day (QID) | ORAL | Status: DC | PRN

## 2014-08-07 MED ORDER — ONDANSETRON HCL 4 MG PO TABS: 4.0000 mg | ORAL_TABLET | Freq: Four times a day (QID) | ORAL | Status: DC | PRN

## 2014-08-07 MED ORDER — ERGOCALCIFEROL 1.25 MG (50000 UT) PO CAPS
50000.0000 [IU] | ORAL_CAPSULE | ORAL | Status: DC
  Administered 2014-08-09: 50000 [IU] via ORAL
  Filled 2014-08-07 (×2): qty 1

## 2014-08-07 MED ORDER — AMLODIPINE BESYLATE 5 MG PO TABS
2.5000 mg | ORAL_TABLET | Freq: Every day | ORAL | Status: DC
  Administered 2014-08-07 – 2014-08-08 (×2): 2.5 mg via ORAL
  Filled 2014-08-07 (×2): qty 1

## 2014-08-07 MED ORDER — ALPRAZOLAM 0.5 MG PO TABS: 0.5000 mg | ORAL_TABLET | Freq: Four times a day (QID) | ORAL | Status: DC | PRN

## 2014-08-07 MED ORDER — ASPIRIN EC 81 MG PO TBEC
81.0000 mg | DELAYED_RELEASE_TABLET | Freq: Every day | ORAL | Status: DC
  Administered 2014-08-07 – 2014-08-09 (×3): 81 mg via ORAL
  Filled 2014-08-07 (×3): qty 1

## 2014-08-07 MED ORDER — METOPROLOL TARTRATE 25 MG PO TABS
25.0000 mg | ORAL_TABLET | Freq: Two times a day (BID) | ORAL | Status: DC
  Administered 2014-08-07 – 2014-08-08 (×2): 25 mg via ORAL
  Filled 2014-08-07 (×3): qty 1

## 2014-08-07 MED ORDER — SODIUM CHLORIDE 0.9 % IV SOLN: Freq: Once | INTRAVENOUS | Status: DC

## 2014-08-07 MED ORDER — ACETAMINOPHEN 650 MG RE SUPP: 650.0000 mg | Freq: Four times a day (QID) | RECTAL | Status: DC | PRN

## 2014-08-07 MED ORDER — FUROSEMIDE 10 MG/ML IJ SOLN
40.0000 mg | Freq: Two times a day (BID) | INTRAMUSCULAR | Status: DC
  Administered 2014-08-07: 40 mg via INTRAVENOUS
  Filled 2014-08-07: qty 4

## 2014-08-07 MED ORDER — IOHEXOL 350 MG/ML SOLN
100.0000 mL | Freq: Once | INTRAVENOUS | Status: AC | PRN
  Administered 2014-08-07: 80 mL via INTRAVENOUS

## 2014-08-07 MED ORDER — LEVOTHYROXINE SODIUM 100 MCG PO TABS
100.0000 ug | ORAL_TABLET | Freq: Every day | ORAL | Status: DC
  Administered 2014-08-07 – 2014-08-09 (×3): 100 ug via ORAL
  Filled 2014-08-07 (×3): qty 1

## 2014-08-07 MED ORDER — PROCHLORPERAZINE MALEATE 10 MG PO TABS
5.0000 mg | ORAL_TABLET | Freq: Four times a day (QID) | ORAL | Status: DC | PRN
  Administered 2014-08-08 – 2014-08-09 (×4): 5 mg via ORAL
  Filled 2014-08-07 (×4): qty 1

## 2014-08-07 MED ORDER — VITAMINS A & D EX OINT
TOPICAL_OINTMENT | CUTANEOUS | Status: AC
  Administered 2014-08-07: 20:00:00
  Filled 2014-08-07: qty 5

## 2014-08-07 NOTE — ED Provider Notes (Signed)
CSN: [id number]     Arrival date & time 08/07/14  9528 History   First MD Initiated Contact with Patient 08/07/14 [phone]     Chief Complaint  Patient presents with  . Shortness of Breath     (Consider location/radiation/quality/duration/timing/severity/associated sxs/prior Treatment) HPI Comments: 81 year old female with history of anemia, congestive heart failure not on Lasix, high blood pressure, pulmonary metastasis, follicular lymphoma last chemotherapy/first chemotherapy treatment Tuesday and Wednesday presents with worsening shortness of breath for the past 2 days. Worse with lying flat mild exertional component. No chest pain or significant leg swelling. Mild pedal edema. No weight changes. No fevers or chills. No oxygen at home. No blood clot history, no recent surgeries, no unilateral leg symptoms. Patient follows closely with oncology. Nonsmoker.  Dark stools.   Patient is a 81 y.o. female presenting with shortness of breath. The history is provided by the patient and medical records.  Shortness of Breath Associated symptoms: no abdominal pain, no chest pain, no fever, no headaches, no neck pain, no rash and no vomiting     Past Medical History  Diagnosis Date  . Vitamin B 12 deficiency   . HTN (hypertension)   . Vitamin D deficiency   . HLD (hyperlipidemia)   . CHF (congestive heart failure)     Patient states " maybe now"  . Pulmonary metastases 07/24/2014   Past Surgical History  Procedure Laterality Date  . Breast biopsy     Family History  Problem Relation Age of Onset  . Stomach cancer    . Melanoma    . Lung disease    . Lymphoma    . Diabetes     History  Substance Use Topics  . Smoking status: Never Smoker   . Smokeless tobacco: Not on file  . Alcohol Use: No   OB History    No data available     Review of Systems  Constitutional: Positive for fatigue. Negative for fever and chills.  HENT: Negative for congestion.   Eyes: Negative for visual  disturbance.  Respiratory: Positive for shortness of breath.   Cardiovascular: Negative for chest pain.  Gastrointestinal: Negative for vomiting and abdominal pain.  Genitourinary: Negative for dysuria and flank pain.  Musculoskeletal: Negative for back pain, neck pain and neck stiffness.  Skin: Negative for rash.  Neurological: Negative for light-headedness and headaches.      Allergies  Review of patient's allergies indicates no known allergies.  Home Medications   Prior to Admission medications   Medication Sig Start Date End Date Taking? Authorizing Provider  ALPRAZolam Duanne Moron) 0.5 MG tablet Take 1 tablet by mouth every 6 (six) hours as needed. anxiety 08/04/14  Yes Historical Provider, MD  amLODipine (NORVASC) 2.5 MG tablet Take 2.5 mg by mouth daily.   Yes Historical Provider, MD  aspirin 81 MG tablet Take 81 mg by mouth daily.   Yes Historical Provider, MD  ergocalciferol (VITAMIN D2) 50000 UNITS capsule Take 50,000 Units by mouth once a week. On Sundays   Yes Historical Provider, MD  ferrous sulfate 325 (65 FE) MG tablet Take 1 tablet (325 mg total) by mouth 2 (two) times daily with a meal. 07/29/14  Yes Ladell Pier, MD  fish oil-omega-3 fatty acids 1000 MG capsule Take 2 g by mouth daily.   Yes Historical Provider, MD  hydrochlorothiazide (HYDRODIURIL) 25 MG tablet Take 25 mg by mouth daily.   Yes Historical Provider, MD  levothyroxine (SYNTHROID, LEVOTHROID) 100 MCG tablet Take 1 tablet (  100 mcg total) by mouth daily before breakfast. 07/24/14  Yes Domenic Polite, MD  lisinopril (PRINIVIL,ZESTRIL) 40 MG tablet Take 0.5 tablets (20 mg total) by mouth daily. 07/24/14  Yes Domenic Polite, MD  pravastatin (PRAVACHOL) 80 MG tablet Take 80 mg by mouth daily.   Yes Historical Provider, MD  PRESCRIPTION MEDICATION Chemo   Yes Historical Provider, MD  prochlorperazine (COMPAZINE) 5 MG tablet Take 1 tablet (5 mg total) by mouth every 6 (six) hours as needed for nausea or vomiting. 08/04/14   Yes Ladell Pier, MD   BP 137/69 mmHg  Pulse 84  Temp(Src) 97.9 F (36.6 C) (Oral)  Resp 24  SpO2 94% Physical Exam  Constitutional: She is oriented to person, place, and time. She appears well-developed and well-nourished.  HENT:  Head: Normocephalic and atraumatic.  Eyes: Conjunctivae are normal. Right eye exhibits no discharge. Left eye exhibits no discharge.  Neck: Normal range of motion. Neck supple. No tracheal deviation present.  Cardiovascular: Normal rate and regular rhythm.   Pulmonary/Chest: Breath sounds normal. No respiratory distress. Wheezes: mild increase effort.  Abdominal: Soft. She exhibits no distension. There is no tenderness. There is no guarding.  Musculoskeletal: She exhibits edema (mild lower extremity is bilateral).  Neurological: She is alert and oriented to person, place, and time.  Skin: Skin is warm. No rash noted.  Psychiatric: She has a normal mood and affect.  Nursing note and vitals reviewed.   ED Course  Procedures (including critical care time) EMERGENCY DEPARTMENT [place] LUNG EXAM "Study: Limited Ultrasound of the lung and thorax"   INDICATIONS: Dyspnea  Multiple views of both lungs using sagittal orientation were obtained.  PERFORMED BY: Myself  IMAGES ARCHIVED?: Yes  FINDINGS: B lines  LIMITATIONS: Body habitus  VIEWS USED: Anterior Lung Fields and Posterior Lung Fields  INTERPRETATION: No Pneumothorax, Pulmonary Edema, No Pleural Effusion Few B lines CPT Code: 10175-10 (limited transthoracic lung)    EMERGENCY DEPARTMENT [place] CARDIAC EXAM "Study: Limited Ultrasound of the heart and pericardium"  INDICATIONS:Dyspnea Multiple views of the heart and pericardium were obtained in real-time with a multi-frequency probe.  PERFORMED CH:ENIDPO  IMAGES ARCHIVED?: Yes  FINDINGS: No pericardial effusion, Normal contractility and Tamponade physiology absent  LIMITATIONS:  Body habitus  VIEWS USED: Subcostal 4 chamber, Parasternal  long axis, Parasternal short axis and Apical 4 chamber   INTERPRETATION: Cardiac activity present, Pericardial effusioin absent, Cardiac tamponade absent and Normal contractility  CPT Code: 24235-36 (limited transthoracic cardiac)   Labs Review Labs Reviewed  CBC WITH DIFFERENTIAL/PLATELET - Abnormal; Notable for the following:    WBC 3.5 (*)    Hemoglobin 8.9 (*)    HCT 29.3 (*)    MCV 75.3 (*)    MCH 22.9 (*)    RDW 17.8 (*)    Platelets 149 (*)    Lymphs Abs 0.4 (*)    Monocytes Relative 13 (*)    All other components within normal limits  POC OCCULT BLOOD, ED - Abnormal; Notable for the following:    Fecal Occult Bld POSITIVE (*)    All other components within normal limits  BASIC METABOLIC PANEL  BRAIN NATRIURETIC PEPTIDE  TROPONIN I    Imaging Review Dg Chest 2 View  08/07/2014   CLINICAL DATA:  Shortness of breath.  Non-Hodgkin's lymphoma.  EXAM: CHEST  2 VIEW  COMPARISON:  Chest CT July 22, 2014; chest radiograph July 22, 2014  FINDINGS: Multiple small pulmonary nodular lesions are noted but better seen on recent CT.  There is no frank edema or consolidation. Heart is upper normal in size with pulmonary vascular within normal limits. No adenopathy is seen by radiography. There is marked wedging of the T12 vertebral body.  IMPRESSION: Multiple small pulmonary nodular lesions are noted throughout the lungs, better seen on recent CT. No frank edema or consolidation.   Electronically Signed   By: Lowella Grip III M.D.   On: 08/07/2014 07:52     EKG Interpretation None     EKG reviewed heart rate 107, no acute ST elevation, PVCs, normal QT MDM   Final diagnoses:  Dyspnea   Patient presents with gradually worsening shortness of breath and orthopnea. Differential diagnosis includes acute heart failure/pulmonary edema, anemia, worsening cancer, blood clot, pericardial effusion, other. Bedside ultrasound no significant effusion, mild B lines  Patient very dyspneic and  increased effort with walking pulse ox not requiring significant oxygen ER. CT scan results reviewed no blood clot, cancer seen. Hemoccult-positive. No active bleeding in the ER. Discussed with tried hospitalist for admission for further workup and treatment. Lasix ordered.  The patients results and plan were reviewed and discussed.   Any x-rays performed were independently reviewed by myself.   Differential diagnosis were considered with the presenting HPI.  Medications  furosemide (LASIX) injection 20 mg (not administered)  iohexol (OMNIPAQUE) 350 MG/ML injection 100 mL (80 mLs Intravenous Contrast Given 08/07/14 0913)    Filed Vitals:   08/07/14 1601 08/07/14 0825 08/07/14 0834 08/07/14 0950  BP:    113/45  Pulse:  93 110 95  Temp:      TempSrc:      Resp:  21 26 26   SpO2: 91% 92% 97% 99%    Final diagnoses:  Dyspnea    Admission/ observation were discussed with the admitting physician, patient and/or family and they are comfortable with the plan.     Elnora Morrison, MD 08/07/14 1024

## 2014-08-07 NOTE — ED Notes (Signed)
Coffee brought to the family

## 2014-08-07 NOTE — Progress Notes (Addendum)
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

## 2014-08-07 NOTE — ED Notes (Signed)
Admitting nurse was at bedside, went to take pt up, hospitalist walked in

## 2014-08-07 NOTE — Progress Notes (Signed)
MD given update Pt has order to transfuse One Unit PRBCS and Blood Bank has notified that Pt with multiple antibodies and will notify staff once unit becomes available.

## 2014-08-07 NOTE — ED Notes (Signed)
hospitalist left room, pt requesting to use bathroom, then will take pt upstairs.

## 2014-08-07 NOTE — ED Notes (Signed)
rn assisted with pt ambulating, 97% on room air while ambulating, R 26, pulse 110

## 2014-08-07 NOTE — ED Notes (Addendum)
PT CAN GO @ 11:00

## 2014-08-07 NOTE — H&P (Signed)
[facility] History and Physical  Jeanifer Halliday Schmeling IDP:[id number] DOB: 09/01/1932 DOA: 08/07/2014  Referring physician: Dr. Reather Converse PCP: Florina Ou, MD   Chief Complaint:  Dyspnea on exertion x 2 days  HPI:  81 year old female with recently diagnosed follicular lymphoma (was admitted to [facility] 2 weeks back with symptomatically anemia and transfused 2 units PRBC and underwent diagnostic bone marrow biopsy which showed hypercellular bone marrow with non-Hodgkin's B-cell lymphoma). She was diuresed with Lasix and a 2-D echo done showed borderline EF of 50-55% with grade 2 diastolic dysfunction. She did not require Lasix upon discharge and was seen by her oncologist Dr. Learta Codding who started her on first cycle of bendamustine/rituximab on 08/04/2014. Patient developed progressive shortness of breath on exertion 2 days back with orthopnea since yesterday. She denies any PND, fever, chills, chest pain, palpitations, cough, dizziness, headache, blurred vision, nausea, vomiting, abdominal pain, bowel or urinary symptoms. Denies any change in her weight or appetite. She reports feeling more tired than usual and has also noticed mild leg swellings. She reports that her dyspnea symptoms are similar to 2 weeks back. She denies any hemoptysis, hematemesis, blood per rectum or hematuria.  Course in the ED Patient was mildly to With O2 sat of 89% on room air. Blood will done showed a ratio 3.5, hemoglobin of 8.9, platelets of 149. Chemistry showed BUN of 35. BNP was 380. Chest x-ray showed multiple small pulmonary nodular lesion without any frank edema or consolidation. CT angiogram of the chest was done to rule out PE which was negative but showed lymphadenopathy with bilateral pulmonary nodules. Also showed pulmonary arterial hypertension with unchanged bilateral layering pleural effusion suggestive of mild CHF pattern. Patient given 21 g IV Lasix and hospitalists admission requested to inpatient  telemetry.  Review of Systems:  Constitutional: Denies fever, chills, diaphoresis, appetite change,  fatigue+.  HEENT: Denies visual or hearing symptoms, congestion, sore throat, difficulty swallowing, neck pain or stiffness Respiratory: SOB, DOE, orthopnea, denies cough, chest tightness,  and wheezing.   Cardiovascular: Denies chest pain, palpitations,  leg swelling+.  Gastrointestinal: Denies nausea, vomiting, abdominal pain, diarrhea, constipation, blood in stool and abdominal distention.  Genitourinary: Denies dysuria, , hematuria, flank pain and difficulty urinating.  Endocrine: Denies: hot or cold intolerance,polyuria, polydipsia. Musculoskeletal: Denies myalgias, back pain, joint pain or swelling Skin: Denies pallor, rash and wound.  Neurological: Weakness, Denies dizziness,  syncope, weakness, light-headedness, numbness and headaches.  Hematological: Denies adenopathy.  Psychiatric/Behavioral: Denies  confusion   Past Medical History  Diagnosis Date  . Vitamin B 12 deficiency   . HTN (hypertension)   . Vitamin D deficiency   . HLD (hyperlipidemia)   . CHF (congestive heart failure)     Patient states " maybe now"  .  follicle or lymphoma with Pulmonary metastases 07/24/2014   Past Surgical History  Procedure Laterality Date  . Breast biopsy     Social History:  reports that she has never smoked. She does not have any smokeless tobacco history on file. She reports that she does not drink alcohol. Her drug history is not on file.  No Known Allergies  Family History  Problem Relation Age of Onset  . Stomach cancer    . Melanoma    . Lung disease    . Lymphoma    . Diabetes      Prior to Admission medications   Medication Sig Start Date End Date Taking? Authorizing Provider  ALPRAZolam Duanne Moron) 0.5 MG tablet Take 1 tablet by  mouth every 6 (six) hours as needed. anxiety 08/04/14  Yes Historical Provider, MD  amLODipine (NORVASC) 2.5 MG tablet Take 2.5 mg by mouth daily.    Yes Historical Provider, MD  aspirin 81 MG tablet Take 81 mg by mouth daily.   Yes Historical Provider, MD  ergocalciferol (VITAMIN D2) 50000 UNITS capsule Take 50,000 Units by mouth once a week. On Sundays   Yes Historical Provider, MD  ferrous sulfate 325 (65 FE) MG tablet Take 1 tablet (325 mg total) by mouth 2 (two) times daily with a meal. 07/29/14  Yes Ladell Pier, MD  fish oil-omega-3 fatty acids 1000 MG capsule Take 2 g by mouth daily.   Yes Historical Provider, MD  hydrochlorothiazide (HYDRODIURIL) 25 MG tablet Take 25 mg by mouth daily.   Yes Historical Provider, MD  levothyroxine (SYNTHROID, LEVOTHROID) 100 MCG tablet Take 1 tablet (100 mcg total) by mouth daily before breakfast. 07/24/14  Yes Domenic Polite, MD  lisinopril (PRINIVIL,ZESTRIL) 40 MG tablet Take 0.5 tablets (20 mg total) by mouth daily. 07/24/14  Yes Domenic Polite, MD  pravastatin (PRAVACHOL) 80 MG tablet Take 80 mg by mouth daily.   Yes Historical Provider, MD  PRESCRIPTION MEDICATION Chemo   Yes Historical Provider, MD  prochlorperazine (COMPAZINE) 5 MG tablet Take 1 tablet (5 mg total) by mouth every 6 (six) hours as needed for nausea or vomiting. 08/04/14  Yes Ladell Pier, MD     Physical Exam:  Filed Vitals:   08/07/14 0834 08/07/14 0950 08/07/14 1039 08/07/14 1040  BP:  113/45  106/62  Pulse: 110 95    Temp:   97.9 F (36.6 C) 97.9 F (36.6 C)  TempSrc:    Oral  Resp: '26 26  20  ' SpO2: 97% 99%  100%    Constitutional: Vital signs reviewed.  Elderly female in no acute distress. HEENT: Pallor +, mild JVD, no icterus, moist oral mucosa, no cervical lymphadenopathy, supple neck Cardiovascular: RRR, S1 normal, S2 normal, no MRG Chest: Diminished bibasilar breath sounds, no wheezes, rales, or rhonchi Abdominal: Soft. Non-tender, non-distended, bowel sounds are normal, no masses,  Ext: warm, trace edema Neurological: A&O x3, non focal  Labs on Admission:  Basic Metabolic Panel:  Recent Labs Lab  08/07/14 0737  NA 137  K 4.1  CL 101  CO2 28  GLUCOSE 110*  BUN 35*  CREATININE 0.86  CALCIUM 9.7   Liver Function Tests: No results for input(s): AST, ALT, ALKPHOS, BILITOT, PROT, ALBUMIN in the last 168 hours. No results for input(s): LIPASE, AMYLASE in the last 168 hours. No results for input(s): AMMONIA in the last 168 hours. CBC:  Recent Labs Lab 08/07/14 0737  WBC 3.5*  NEUTROABS 2.6  HGB 8.9*  HCT 29.3*  MCV 75.3*  PLT 149*   Cardiac Enzymes:  Recent Labs Lab 08/07/14 0737  TROPONINI <0.03   BNP: Invalid input(s): POCBNP CBG: No results for input(s): GLUCAP in the last 168 hours.  Radiological Exams on Admission: Dg Chest 2 View  08/07/2014   CLINICAL DATA:  Shortness of breath.  Non-Hodgkin's lymphoma.  EXAM: CHEST  2 VIEW  COMPARISON:  Chest CT July 22, 2014; chest radiograph July 22, 2014  FINDINGS: Multiple small pulmonary nodular lesions are noted but better seen on recent CT. There is no frank edema or consolidation. Heart is upper normal in size with pulmonary vascular within normal limits. No adenopathy is seen by radiography. There is marked wedging of the T12 vertebral body.  IMPRESSION:  Multiple small pulmonary nodular lesions are noted throughout the lungs, better seen on recent CT. No frank edema or consolidation.   Electronically Signed   By: Lowella Grip III M.D.   On: 08/07/2014 07:52   Ct Angio Chest Pe W/cm &/or Wo Cm  08/07/2014   CLINICAL DATA:  Chemotherapy July 12th and 13. Short of breath yesterday. Difficulty breathing. Lymphoma.  EXAM: CT ANGIOGRAPHY CHEST WITH CONTRAST  TECHNIQUE: Multidetector CT imaging of the chest was performed using the standard protocol during bolus administration of intravenous contrast. Multiplanar CT image reconstructions and MIPs were obtained to evaluate the vascular anatomy.  CONTRAST:  38m OMNIPAQUE IOHEXOL 350 MG/ML SOLN  COMPARISON:  CTA 07/22/2014.  FINDINGS: Bones: No aggressive osseous lesions.  Sternoclavicular joint osteoarthritis. Exaggerated thoracic kyphosis. Unchanged T12 compression fractures and Schmorl's nodes throughout the thoracic spine.  Cardiovascular: Pulmonary arterial hypertension. Coronary artery atherosclerosis is present. If office based assessment of coronary risk factors has not been performed, it is now recommended. Mild cardiomegaly. Negative for pulmonary embolism. No acute aortic abnormality.  Lungs: Negative for pneumonia. Multiple pulmonary nodules are unchanged compared to recent prior CT. Dependent atelectasis.  Central airways: Patent.  Effusions: Tiny bilateral dependently layering pleural effusions.  Lymphadenopathy: RIGHT-greater-than- LEFT axillary lymphadenopathy. Mediastinal and hilar lymphadenopathy compatible with lymphoma. This is unchanged compared to recent prior. This includes retrocrural and upper abdominal lymphadenopathy.  Esophagus: Moderate hiatal hernia with the intrathoracic stomach collapsed. No definite esophageal thickening.  Upper abdomen: Upper abdominal adenopathy.  Other: Unchanged appearance of the thyroid gland.  Review of the MIP images confirms the above findings.  IMPRESSION: 1. Negative for pulmonary embolism or acute aortic abnormality. 2. Lymphadenopathy consistent with history of lymphoma. Unchanged innumerable bilateral pulmonary nodules. No superimposed acute cardiopulmonary disease. 3. Pulmonary arterial hypertension and coronary artery disease. 4. Mild cardiomegaly which appears similar to the prior CT 07/22/2014. Unchanged tiny bilateral dependently layering pleural effusions. Given the cardiomegaly and small pleural effusions, there is probably mild CHF.   Electronically Signed   By: GDereck LigasM.D.   On: 08/07/2014 09:41    EKG: Sinus tachycardia at 107 with PVCs  Assessment/Plan   Acute hypoxic respiratory failure secondary to Acute on chronic diastolic CHF exacerbation -Admit to telemetry -Start on IV lasix 40 mg bid.  Monitor daily weight and strict I/O -Continue lisinopril. Will add low-dose metoprolol. -Dyspnea also contributed by symptomatically anemia. We'll plan on transfusing him with 1 unit PRBC. -We will assess need for home O2 upon discharge (most likely). She will also need Lasix upon discharge.  Non-Hodgkin's B-cell lymphoma first cycle of bendamustine/rituximab on 08/04/2014. Dr. SLearta Coddinginformed who will see her during this hospitalization and agrees with current plan.  Symptomatic anemia Noted for slight drop in H&H. She was transfused with 2 units PRBC during recent hospitalization. Will transfuse her with 1 unit PRBC and monitor. Stool for occult blood was positive in ED, the patient denies any GI bleed and she is on iron supplement as well. Continue iron supplement. Monitor H&H closely.  Essential hypertension Blood pressure is stable. Continue low-dose amlodipine. Continue lisinopril and add low dose beta blocker.  Pancytopenia Mild. Likely associated with lymphoma and chemotherapy.  Dyslipidemia Continue statin.  Diet:cardiac  DVT prophylaxis: sq lovenox   Code Status: full code Family Communication: discussed with patient and her daughters at bedside Disposition Plan: Admit to inpatient telemetry. Possible discharge on 7/17  DLouellen Molder[facility] Pager [phone] Total time spent on admission :70 minutes  If 7PM-7AM, please contact night-coverage [email or web address] Password [facility] 08/07/2014, 11:27 AM

## 2014-08-07 NOTE — ED Notes (Addendum)
Pt alert and oriented x4.Bilateral symmetrical rise and fall of chest. Skin warm and dry. In no acute distress. Denies needs.   Pt was on 2L [place], does not wear oxygen at home, will check pts room air sat.

## 2014-08-07 NOTE — ED Notes (Signed)
Patient reports she received chemo tx on 7/12 and 7/13.  States she began feeling short of breath yesterday and could hardly breathe last night.  Pulse ox: 100% on RA, but breathing is somewhat labored.

## 2014-08-07 NOTE — Progress Notes (Signed)
I called and left a message on vmail for Robin to advise the fmla forms are ready for pick at front desk with ms. Wilma. I tried calling home for patient but no vmail is set up at that ph# . I faxed forms to [phone]

## 2014-08-07 NOTE — ED Notes (Signed)
Pt to xray

## 2014-08-07 NOTE — ED Notes (Signed)
MD at bedside. 

## 2014-08-08 DIAGNOSIS — R0609 Other forms of dyspnea: Secondary | ICD-10-CM

## 2014-08-08 DIAGNOSIS — R195 Other fecal abnormalities: Secondary | ICD-10-CM

## 2014-08-08 HISTORY — DX: Other fecal abnormalities: R19.5

## 2014-08-08 LAB — BASIC METABOLIC PANEL
Anion gap: 10 (ref 5–15)
BUN: 40 mg/dL — ABNORMAL HIGH (ref 6–20)
CO2: 33 mmol/L — ABNORMAL HIGH (ref 22–32)
Calcium: 9.5 mg/dL (ref 8.9–10.3)
Chloride: 95 mmol/L — ABNORMAL LOW (ref 101–111)
Creatinine, Ser: 1.22 mg/dL — ABNORMAL HIGH (ref 0.44–1.00)
GFR calc Af Amer: 47 mL/min — ABNORMAL LOW (ref >60)
GFR calc non Af Amer: 41 mL/min — ABNORMAL LOW (ref >60)
Glucose, Bld: 112 mg/dL — ABNORMAL HIGH (ref 65–99)
Potassium: 4.1 mmol/L (ref 3.5–5.1)
Sodium: 138 mmol/L (ref 135–145)

## 2014-08-08 LAB — CBC
HCT: 27 % — ABNORMAL LOW (ref 36.0–46.0)
Hemoglobin: 8.3 g/dL — ABNORMAL LOW (ref 12.0–15.0)
MCH: 23.3 pg — ABNORMAL LOW (ref 26.0–34.0)
MCHC: 30.7 g/dL (ref 30.0–36.0)
MCV: 75.8 fL — ABNORMAL LOW (ref 78.0–100.0)
Platelets: 141 10*3/uL — ABNORMAL LOW (ref 150–400)
RBC: 3.56 MIL/uL — ABNORMAL LOW (ref 3.87–5.11)
RDW: 17.8 % — ABNORMAL HIGH (ref 11.5–15.5)
WBC: 2.8 10*3/uL — ABNORMAL LOW (ref 4.0–10.5)

## 2014-08-08 MED ORDER — LISINOPRIL 10 MG PO TABS
5.0000 mg | ORAL_TABLET | Freq: Every day | ORAL | Status: DC
  Administered 2014-08-09: 5 mg via ORAL
  Filled 2014-08-08: qty 1

## 2014-08-08 NOTE — Progress Notes (Signed)
[facility] PROGRESS NOTE  Amanda Sellers LOV:[id number] DOB: 12-25-1932 DOA: 08/07/2014 PCP: Florina Ou, MD  Assessment/Plan: Acute hypoxic respiratory failure secondary to Acute on chronic diastolic CHF exacerbation -Admit to telemetry - IV lasix 40 mg bid. Monitor daily weight and strict I/O -continue lisinopril ( dose reduced) -Dyspnea also contributed by symptomatically anemia. transfused 1 unit PRBC. -We will assess need for home O2 upon discharge (most likely). She will also need Lasix upon discharge.  Non-Hodgkin's B-cell lymphoma first cycle of bendamustine/rituximab on 08/04/2014. Dr. Learta Codding following.  Symptomatic anemia Slight drop in H&H. She was transfused with 2 units PRBC during recent hospitalization. Transfused 1 U PRBC this am. Reports passing dark stool and stool for occult blood was positive in ED. she is on iron supplement as well. Continue iron supplement. Monitor H&H this evening. If still low despite transfusion will need GI evaluation.   Essential hypertension BP low. Hold amlodipine. Reduce lisinopril.  Hold off on BB.  Pancytopenia Mild. Likely associated with lymphoma and chemotherapy.  Dyslipidemia Continue statin.  Diet:cardiac  DVT prophylaxis: sq lovenox   Code Status: full code Family Communication:daughter at bedside Disposition Plan: Continue telemetry monitoring. Possible discharge in 1-2 days  Louellen Molder [facility] Pager [phone]  Consultants:  Dr Learta Codding  Procedures:  none  Antibiotics:  none  HPI/Subjective: Had 3 BMs this am with dark stools. Reports feeling very tired.   Objective: Filed Vitals:   08/08/14 0920  BP: 109/38  Pulse: 86  Temp: 97.6 F (36.4 C)  Resp: 18    Intake/Output Summary (Last 24 hours) at 08/08/14 1214 Last data filed at 08/08/14 0958  Gross per 24 hour  Intake    250 ml  Output   1100 ml  Net   -850 ml   Filed Weights   08/07/14 1156 08/08/14 0507   Weight: 76 kg (167 lb 8.8 oz) 75.8 kg (167 lb 1.7 oz)    Exam:   General:  Elderly female in no distress, appears fatigued  HEENT: Pallor present, moist mucosa, no JVD  Chest: Clear to auscultation bilaterally, minor sounds  Cardiovascular: Normal S1 and S2, no murmurs rub or gallop  GI: Soft, nondistended, nontender  Musculoskeletal: Warm, no edema  CNS: Alert and oriented    Data Reviewed: Basic Metabolic Panel:  Recent Labs Lab 08/07/14 0737 08/08/14 0510  NA 137 138  K 4.1 4.1  CL 101 95*  CO2 28 33*  GLUCOSE 110* 112*  BUN 35* 40*  CREATININE 0.86 1.22*  CALCIUM 9.7 9.5   Liver Function Tests: No results for input(s): AST, ALT, ALKPHOS, BILITOT, PROT, ALBUMIN in the last 168 hours. No results for input(s): LIPASE, AMYLASE in the last 168 hours. No results for input(s): AMMONIA in the last 168 hours. CBC:  Recent Labs Lab 08/07/14 0737 08/08/14 0510  WBC 3.5* 2.8*  NEUTROABS 2.6  --   HGB 8.9* 8.3*  HCT 29.3* 27.0*  MCV 75.3* 75.8*  PLT 149* 141*   Cardiac Enzymes:  Recent Labs Lab 08/07/14 0737  TROPONINI <0.03   BNP (last 3 results)  Recent Labs  07/22/14 0830 08/07/14 0737  BNP 282.2* 379.6*    ProBNP (last 3 results) No results for input(s): PROBNP in the last 8760 hours.  CBG: No results for input(s): GLUCAP in the last 168 hours.  No results found for this or any previous visit (from the past 240 hour(s)).   Studies: Dg Chest 2 View  08/07/2014   CLINICAL DATA:  Shortness of breath.  Non-Hodgkin's lymphoma.  EXAM: CHEST  2 VIEW  COMPARISON:  Chest CT July 22, 2014; chest radiograph July 22, 2014  FINDINGS: Multiple small pulmonary nodular lesions are noted but better seen on recent CT. There is no frank edema or consolidation. Heart is upper normal in size with pulmonary vascular within normal limits. No adenopathy is seen by radiography. There is marked wedging of the T12 vertebral body.  IMPRESSION: Multiple small  pulmonary nodular lesions are noted throughout the lungs, better seen on recent CT. No frank edema or consolidation.   Electronically Signed   By: Lowella Grip III M.D.   On: 08/07/2014 07:52   Ct Angio Chest Pe W/cm &/or Wo Cm  08/07/2014   CLINICAL DATA:  Chemotherapy July 12th and 13. Short of breath yesterday. Difficulty breathing. Lymphoma.  EXAM: CT ANGIOGRAPHY CHEST WITH CONTRAST  TECHNIQUE: Multidetector CT imaging of the chest was performed using the standard protocol during bolus administration of intravenous contrast. Multiplanar CT image reconstructions and MIPs were obtained to evaluate the vascular anatomy.  CONTRAST:  48mL OMNIPAQUE IOHEXOL 350 MG/ML SOLN  COMPARISON:  CTA 07/22/2014.  FINDINGS: Bones: No aggressive osseous lesions. Sternoclavicular joint osteoarthritis. Exaggerated thoracic kyphosis. Unchanged T12 compression fractures and Schmorl's nodes throughout the thoracic spine.  Cardiovascular: Pulmonary arterial hypertension. Coronary artery atherosclerosis is present. If office based assessment of coronary risk factors has not been performed, it is now recommended. Mild cardiomegaly. Negative for pulmonary embolism. No acute aortic abnormality.  Lungs: Negative for pneumonia. Multiple pulmonary nodules are unchanged compared to recent prior CT. Dependent atelectasis.  Central airways: Patent.  Effusions: Tiny bilateral dependently layering pleural effusions.  Lymphadenopathy: RIGHT-greater-than- LEFT axillary lymphadenopathy. Mediastinal and hilar lymphadenopathy compatible with lymphoma. This is unchanged compared to recent prior. This includes retrocrural and upper abdominal lymphadenopathy.  Esophagus: Moderate hiatal hernia with the intrathoracic stomach collapsed. No definite esophageal thickening.  Upper abdomen: Upper abdominal adenopathy.  Other: Unchanged appearance of the thyroid gland.  Review of the MIP images confirms the above findings.  IMPRESSION: 1. Negative for  pulmonary embolism or acute aortic abnormality. 2. Lymphadenopathy consistent with history of lymphoma. Unchanged innumerable bilateral pulmonary nodules. No superimposed acute cardiopulmonary disease. 3. Pulmonary arterial hypertension and coronary artery disease. 4. Mild cardiomegaly which appears similar to the prior CT 07/22/2014. Unchanged tiny bilateral dependently layering pleural effusions. Given the cardiomegaly and small pleural effusions, there is probably mild CHF.   Electronically Signed   By: Dereck Ligas M.D.   On: 08/07/2014 09:41    Scheduled Meds: . amLODipine  2.5 mg Oral Daily  . aspirin EC  81 mg Oral Daily  . enoxaparin (LOVENOX) injection  40 mg Subcutaneous Q24H  . [START ON 08/09/2014] ergocalciferol  50,000 Units Oral Weekly  . ferrous sulfate  325 mg Oral BID WC  . furosemide  40 mg Intravenous Q12H  . levothyroxine  100 mcg Oral QAC breakfast  . lisinopril  20 mg Oral Daily  . metoprolol tartrate  25 mg Oral BID  . omega-3 acid ethyl esters  2 g Oral Daily  . pravastatin  80 mg Oral Daily  . sodium chloride  3 mL Intravenous Q12H   Continuous Infusions:     Time spent: 25 minutes    Maelani Yarbro, [place]  [facility] Pager [phone] If 7PM-7AM, please contact night-coverage at [email or web address], password [facility] 08/08/2014, 12:14 PM  LOS: 1 day

## 2014-08-08 NOTE — Progress Notes (Addendum)
IP PROGRESS NOTE  Subjective:   [name] completed a first cycle of bendamustine/rituximab beginning 08/04/2014. She reports tolerating the chemotherapy without acute toxicity. She reports dyspnea and orthopnea beginning 08/06/2014 and presented to the emergency room. She was noted to have evidence of mild congestive heart failure on CT and was admitted for diuresis. [name] now feels better. She has mild nausea. She continues to have orthopnea.  Objective: Vital signs in last 24 hours: Blood pressure 90/45, pulse 75, temperature 97.9 F (36.6 C), temperature source Oral, resp. rate 18, height '5\' 2"'  (1.575 m), weight 167 lb 1.7 oz (75.8 kg), SpO2 95 %.  Intake/Output from previous day: 07/15 0701 - 07/16 0700 In: 250 [I.V.:250] Out: 900 [Urine:900]  Physical Exam:  Lungs: Clear bilaterally Cardiac: Irregular Abdomen: No hepatosplenomegaly, nontender Extremities: No leg edema Lymphatics: 1 cm right submandibular node    Lab Results:  Recent Labs  08/07/14 0737 08/08/14 0510  WBC 3.5* 2.8*  HGB 8.9* 8.3*  HCT 29.3* 27.0*  PLT 149* 141*    BMET  Recent Labs  08/07/14 0737 08/08/14 0510  NA 137 138  K 4.1 4.1  CL 101 95*  CO2 28 33*  GLUCOSE 110* 112*  BUN 35* 40*  CREATININE 0.86 1.22*  CALCIUM 9.7 9.5    Studies/Results: Dg Chest 2 View  08/07/2014   CLINICAL DATA:  Shortness of breath.  Non-Hodgkin's lymphoma.  EXAM: CHEST  2 VIEW  COMPARISON:  Chest CT July 22, 2014; chest radiograph July 22, 2014  FINDINGS: Multiple small pulmonary nodular lesions are noted but better seen on recent CT. There is no frank edema or consolidation. Heart is upper normal in size with pulmonary vascular within normal limits. No adenopathy is seen by radiography. There is marked wedging of the T12 vertebral body.  IMPRESSION: Multiple small pulmonary nodular lesions are noted throughout the lungs, better seen on recent CT. No frank edema or consolidation.   Electronically  Signed   By: Lowella Grip III M.D.   On: 08/07/2014 07:52   Ct Angio Chest Pe W/cm &/or Wo Cm  08/07/2014   CLINICAL DATA:  Chemotherapy July 12th and 13. Short of breath yesterday. Difficulty breathing. Lymphoma.  EXAM: CT ANGIOGRAPHY CHEST WITH CONTRAST  TECHNIQUE: Multidetector CT imaging of the chest was performed using the standard protocol during bolus administration of intravenous contrast. Multiplanar CT image reconstructions and MIPs were obtained to evaluate the vascular anatomy.  CONTRAST:  77m OMNIPAQUE IOHEXOL 350 MG/ML SOLN  COMPARISON:  CTA 07/22/2014.  FINDINGS: Bones: No aggressive osseous lesions. Sternoclavicular joint osteoarthritis. Exaggerated thoracic kyphosis. Unchanged T12 compression fractures and Schmorl's nodes throughout the thoracic spine.  Cardiovascular: Pulmonary arterial hypertension. Coronary artery atherosclerosis is present. If office based assessment of coronary risk factors has not been performed, it is now recommended. Mild cardiomegaly. Negative for pulmonary embolism. No acute aortic abnormality.  Lungs: Negative for pneumonia. Multiple pulmonary nodules are unchanged compared to recent prior CT. Dependent atelectasis.  Central airways: Patent.  Effusions: Tiny bilateral dependently layering pleural effusions.  Lymphadenopathy: RIGHT-greater-than- LEFT axillary lymphadenopathy. Mediastinal and hilar lymphadenopathy compatible with lymphoma. This is unchanged compared to recent prior. This includes retrocrural and upper abdominal lymphadenopathy.  Esophagus: Moderate hiatal hernia with the intrathoracic stomach collapsed. No definite esophageal thickening.  Upper abdomen: Upper abdominal adenopathy.  Other: Unchanged appearance of the thyroid gland.  Review of the MIP images confirms the above findings.  IMPRESSION: 1. Negative for pulmonary embolism or acute aortic abnormality. 2. Lymphadenopathy  consistent with history of lymphoma. Unchanged innumerable bilateral  pulmonary nodules. No superimposed acute cardiopulmonary disease. 3. Pulmonary arterial hypertension and coronary artery disease. 4. Mild cardiomegaly which appears similar to the prior CT 07/22/2014. Unchanged tiny bilateral dependently layering pleural effusions. Given the cardiomegaly and small pleural effusions, there is probably mild CHF.   Electronically Signed   By: Dereck Ligas M.D.   On: 08/07/2014 09:41    Medications: I have reviewed the patient's current medications.  Assessment/Plan:  1. Non-Hodgkin's Lymphoma-bone marrow biopsy 07/24/2014 consistent with involvement by follicular B-cell lymphoma, CD20 positive  CTs of the chest 07/22/2014 and CT of the abdomen and pelvis on 07/23/2014-pulmonary nodules, hilar/mediastinal/supraclavicular/axillary adenopathy, splenomegaly, abdominal adenopathy, bilateral adrenal nodules  Cycle 1 bendamustine/rituximab 08/04/2014  2. Severe microcytic anemia, status post transfusion with packed red blood cells 07/23/2014  Normal ferritin, low transferrin saturation, "scant "bone marrow iron stores  3. Exertional dyspnea /orthopnea-most likely secondary to congestive heart failure  4. History of a B-12 deficiency  [name] was admitted with exertional dyspnea and orthopnea. Her symptoms are most likely related to congestive heart failure in the setting of severe anemia. I doubt the dyspnea is related to chemotherapy or lymphoma.  Recommendations: 1. Agree with red cell transfusion 2. Diuresis per the medical service 3. Follow-up as scheduled at the [facility]  I answered questions regarding the lymphoma and updated her daughter at the bedside   LOS: 1 day   [facility], [place]  08/08/2014, 8:57 AM

## 2014-08-09 DIAGNOSIS — C8290 Follicular lymphoma, unspecified, unspecified site: Secondary | ICD-10-CM

## 2014-08-09 DIAGNOSIS — D509 Iron deficiency anemia, unspecified: Secondary | ICD-10-CM | POA: Diagnosis present

## 2014-08-09 LAB — CBC
HCT: 33.6 % — ABNORMAL LOW (ref 36.0–46.0)
Hemoglobin: 10.5 g/dL — ABNORMAL LOW (ref 12.0–15.0)
MCH: 24.6 pg — ABNORMAL LOW (ref 26.0–34.0)
MCHC: 31.3 g/dL (ref 30.0–36.0)
MCV: 78.9 fL (ref 78.0–100.0)
Platelets: 166 10*3/uL (ref 150–400)
RBC: 4.26 MIL/uL (ref 3.87–5.11)
RDW: 18.5 % — ABNORMAL HIGH (ref 11.5–15.5)
WBC: 3.6 10*3/uL — ABNORMAL LOW (ref 4.0–10.5)

## 2014-08-09 LAB — BASIC METABOLIC PANEL
Anion gap: 9 (ref 5–15)
BUN: 43 mg/dL — ABNORMAL HIGH (ref 6–20)
CO2: 30 mmol/L (ref 22–32)
Calcium: 9.3 mg/dL (ref 8.9–10.3)
Chloride: 98 mmol/L — ABNORMAL LOW (ref 101–111)
Creatinine, Ser: 1.25 mg/dL — ABNORMAL HIGH (ref 0.44–1.00)
GFR calc Af Amer: 46 mL/min — ABNORMAL LOW (ref >60)
GFR calc non Af Amer: 40 mL/min — ABNORMAL LOW (ref >60)
Glucose, Bld: 134 mg/dL — ABNORMAL HIGH (ref 65–99)
Potassium: 3.8 mmol/L (ref 3.5–5.1)
Sodium: 137 mmol/L (ref 135–145)

## 2014-08-09 MED ORDER — FUROSEMIDE 40 MG PO TABS: 40.0000 mg | ORAL_TABLET | Freq: Every day | ORAL | Status: DC

## 2014-08-09 NOTE — Discharge Instructions (Signed)

## 2014-08-09 NOTE — Progress Notes (Signed)
SATURATION QUALIFICATIONS: (This note is used to comply with regulatory documentation for home oxygen)  Patient Saturations on Room Air at Rest = 99%  Patient Saturations on Room Air while Ambulating = 99-100%

## 2014-08-09 NOTE — Discharge Summary (Signed)
Physician Discharge Summary  Amanda Sellers IOE:[id number] DOB: Jun 12, 1932 DOA: 08/07/2014  PCP: patient reports that her PCP may have left practice and she may not go back there.  Admit date: 08/07/2014 Discharge date: 08/09/2014  Time spent: 35 minutes  Recommendations for Outpatient Follow-up:  #1 discharge home with home O2 ( 2L via [place]) at night. #2 patient provided with a list of PCP in the community. #3 patient will follow-up with her oncologist as scheduled. #4. Patient should follow-up with [facility] (Dr. Watt Climes) in 4 weeks   Discharge Diagnoses:  Principal Problem:   Acute diastolic CHF (congestive heart failure)   Active Problems:   CHF (congestive heart failure)   Symptomatic anemia   Hypertension   Hyperlipidemia   Pulmonary metastases   Follicular lymphoma   Acute respiratory failure with hypoxia   Pancytopenia due to antineoplastic chemotherapy   Microcytic anemia   Discharge Condition: fair  Diet recommendation: heart healthy  Filed Weights   08/07/14 1156 08/08/14 0507 08/09/14 0551  Weight: 76 kg (167 lb 8.8 oz) 75.8 kg (167 lb 1.7 oz) 75.116 kg (165 lb 9.6 oz)    History of present illness:  Please refer to admission H&P for details, in brief, 81 year old female with recently diagnosed follicular lymphoma (was admitted to [facility] 2 weeks back with symptomatically anemia and transfused 2 units PRBC,  underwent diagnostic bone marrow biopsy which showed hypercellular bone marrow with non-Hodgkin's B-cell lymphoma). She was diuresed with Lasix and a 2-D echo done showed borderline EF of 50-55% with grade 2 diastolic dysfunction. She did not require Lasix upon discharge and was seen by her oncologist Dr. Learta Codding who started her on first cycle of bendamustine/rituximab on 08/04/2014. Patient developed progressive shortness of breath on exertion 2 days back with orthopnea since one day.  She denied any PND, fever, chills, chest pain, palpitations, cough,  dizziness, headache, blurred vision, nausea, vomiting, abdominal pain, bowel or urinary symptoms. Denies any change in her weight or appetite. She reported feeling more tired than usual and has also noticed mild leg swellings. She reports that her dyspnea symptoms are similar to 2 weeks back. She denied any hemoptysis, hematemesis, blood per rectum or hematuria. She however did report her stools to be dark.  Course in the ED Patient was hypoxic  O2 sat of 89% on room air. Blood work done showed wbc of 3.5, hemoglobin of 8.9, platelets of 149. Chemistry showed BUN of 35. BNP was 380. Chest x-ray showed multiple small pulmonary nodular lesion without any frank edema or consolidation. CT angiogram of the chest was done to rule out PE which was negative but showed lymphadenopathy with bilateral pulmonary nodules. Also showed pulmonary arterial hypertension with unchanged bilateral layering pleural effusion suggestive of mild CHF pattern. Patient given 40 mg IV Lasix and hospitalists admission requested to inpatient telemetry.  Hospital Course:  Acute hypoxic respiratory failure secondary to Acute on chronic diastolic CHF exacerbation Placed on IV Lasix with good clinical improvement. -Symptoms improved after 1 unit PRBC transfusion. Patient's O2 sat remained stable (>90%) at rest and on ambulation but patient became increasingly dyspneic on lying down and could not sleep in the hospital. On placing O2 via nasal cannula she was able to lie flat comfortably. Although it was not assessed acting she likely has nocturnal hypoxia and some persistent orthopnea and oxygen definitely helps with her symptoms. -I've  arrange for home O2 ( 2L via [place]) use  at bedtime. -Heart failure instructions provided to patient. -  Patient will need a new PCP in the community.   Non-Hodgkin's B-cell lymphoma first cycle of bendamustine/rituximab on 08/04/2014. Dr. Learta Codding following.  Symptomatic microcytic hypochromic anemia   She was transfused with 2 units PRBC during recent hospitalization. Transfused 1 U PRBC this hospitalization with  improvement in her Hb to 10.5 gm. Reports passing dark stool and stool for occult blood was positive in ED. she is on iron supplement as well. Continue iron supplement.  Discussed with Dr. Learta Codding who recommends that a peripheral smear shows microcytic hypochromic anemia. Recommend outpatient GI follow-up. Patient reports being  seen by [facility] and had colonoscopy about 10 years back. I have spoken with [facility] Dr. Watt Climes who agrees with outpatient follow-up with him. Patient provided with contact information and instructed to make appointment in 4 weeks.  Essential hypertension Blood pressure medications reduced due to low blood pressure while in the hospital. Patient will be discharged on Lasix and resume home dose of amlodipine and lisinopril. HCTZ discontinued.  Pancytopenia Mild. Likely associated with lymphoma and chemotherapy. Follow-up as outpatient.  Dyslipidemia Continue statin.  Diet:cardiac     Code Status: full code Family Communication:daughter at bedside Disposition Plan: Home with outpatient follow-up.   Discharge Exam: Filed Vitals:   08/09/14 0537  BP: 106/43  Pulse: 83  Temp: 97.6 F (36.4 C)  Resp: 14    General: Elderly female in no distress  HEENT: Pallor present, moist mucosa, no JVD, supple neck  Chest: Clear to auscultation bilaterally, minor sounds  Cardiovascular: Normal S1 and S2, no murmurs rub or gallop  GI: Soft, nondistended, nontender  Musculoskeletal: Warm, no edema  CNS: Alert and oriented   Discharge Instructions    Current Discharge Medication List    START taking these medications   Details  furosemide (LASIX) 40 MG tablet Take 1 tablet (40 mg total) by mouth daily. Qty: 30 tablet, Refills: 2      CONTINUE these medications which have NOT CHANGED   Details  ALPRAZolam (XANAX) 0.5 MG tablet Take 1 tablet  by mouth every 6 (six) hours as needed. anxiety    amLODipine (NORVASC) 2.5 MG tablet Take 2.5 mg by mouth daily.    aspirin 81 MG tablet Take 81 mg by mouth daily.    ergocalciferol (VITAMIN D2) 50000 UNITS capsule Take 50,000 Units by mouth once a week. On Sundays    ferrous sulfate 325 (65 FE) MG tablet Take 1 tablet (325 mg total) by mouth 2 (two) times daily with a meal. Qty: 30 tablet, Refills: 1    fish oil-omega-3 fatty acids 1000 MG capsule Take 2 g by mouth daily.    levothyroxine (SYNTHROID, LEVOTHROID) 100 MCG tablet Take 1 tablet (100 mcg total) by mouth daily before breakfast. Qty: 30 tablet, Refills: 0    lisinopril (PRINIVIL,ZESTRIL) 40 MG tablet Take 0.5 tablets (20 mg total) by mouth daily.    pravastatin (PRAVACHOL) 80 MG tablet Take 80 mg by mouth daily.    PRESCRIPTION MEDICATION Chemo    prochlorperazine (COMPAZINE) 5 MG tablet Take 1 tablet (5 mg total) by mouth every 6 (six) hours as needed for nausea or vomiting. Qty: 30 tablet, Refills: 1      STOP taking these medications     hydrochlorothiazide (HYDRODIURIL) 25 MG tablet        No Known Allergies Follow-up Information    Follow up with [facility], MD. Schedule an appointment as soon as possible for a visit in 4 weeks.  Specialty:  Gastroenterology   Contact information:   [address] [place] [address] [phone]       Follow up with [name], MD.   Specialty:  Oncology   Why:  as scheduled   Contact information:   [place] [address] [phone]        The results of significant diagnostics from this hospitalization (including imaging, microbiology, ancillary and laboratory) are listed below for reference.    Significant Diagnostic Studies: Dg Chest 2 View  08/07/2014   CLINICAL DATA:  Shortness of breath.  Non-Hodgkin's lymphoma.  EXAM: CHEST  2 VIEW  COMPARISON:  Chest CT July 22, 2014; chest radiograph July 22, 2014   FINDINGS: Multiple small pulmonary nodular lesions are noted but better seen on recent CT. There is no frank edema or consolidation. Heart is upper normal in size with pulmonary vascular within normal limits. No adenopathy is seen by radiography. There is marked wedging of the T12 vertebral body.  IMPRESSION: Multiple small pulmonary nodular lesions are noted throughout the lungs, better seen on recent CT. No frank edema or consolidation.   Electronically Signed   By: Lowella Grip III M.D.   On: 08/07/2014 07:52   Ct Angio Chest Pe W/cm &/or Wo Cm  08/07/2014   CLINICAL DATA:  Chemotherapy July 12th and 13. Short of breath yesterday. Difficulty breathing. Lymphoma.  EXAM: CT ANGIOGRAPHY CHEST WITH CONTRAST  TECHNIQUE: Multidetector CT imaging of the chest was performed using the standard protocol during bolus administration of intravenous contrast. Multiplanar CT image reconstructions and MIPs were obtained to evaluate the vascular anatomy.  CONTRAST:  71m OMNIPAQUE IOHEXOL 350 MG/ML SOLN  COMPARISON:  CTA 07/22/2014.  FINDINGS: Bones: No aggressive osseous lesions. Sternoclavicular joint osteoarthritis. Exaggerated thoracic kyphosis. Unchanged T12 compression fractures and Schmorl's nodes throughout the thoracic spine.  Cardiovascular: Pulmonary arterial hypertension. Coronary artery atherosclerosis is present. If office based assessment of coronary risk factors has not been performed, it is now recommended. Mild cardiomegaly. Negative for pulmonary embolism. No acute aortic abnormality.  Lungs: Negative for pneumonia. Multiple pulmonary nodules are unchanged compared to recent prior CT. Dependent atelectasis.  Central airways: Patent.  Effusions: Tiny bilateral dependently layering pleural effusions.  Lymphadenopathy: RIGHT-greater-than- LEFT axillary lymphadenopathy. Mediastinal and hilar lymphadenopathy compatible with lymphoma. This is unchanged compared to recent prior. This includes retrocrural and  upper abdominal lymphadenopathy.  Esophagus: Moderate hiatal hernia with the intrathoracic stomach collapsed. No definite esophageal thickening.  Upper abdomen: Upper abdominal adenopathy.  Other: Unchanged appearance of the thyroid gland.  Review of the MIP images confirms the above findings.  IMPRESSION: 1. Negative for pulmonary embolism or acute aortic abnormality. 2. Lymphadenopathy consistent with history of lymphoma. Unchanged innumerable bilateral pulmonary nodules. No superimposed acute cardiopulmonary disease. 3. Pulmonary arterial hypertension and coronary artery disease. 4. Mild cardiomegaly which appears similar to the prior CT 07/22/2014. Unchanged tiny bilateral dependently layering pleural effusions. Given the cardiomegaly and small pleural effusions, there is probably mild CHF.   Electronically Signed   By: GDereck LigasM.D.   On: 08/07/2014 09:41   Ct Angio Chest Pe W/cm &/or Wo Cm  07/22/2014   CLINICAL DATA:  S79year old female with worsening shortness of breath x2 weeks.  EXAM: CT ANGIOGRAPHY CHEST WITH CONTRAST  TECHNIQUE: Multidetector CT imaging of the chest was performed using the standard protocol during bolus administration of intravenous contrast. Multiplanar CT image reconstructions and MIPs were obtained to evaluate the vascular  anatomy.  CONTRAST:  160m OMNIPAQUE IOHEXOL 350 MG/ML SOLN  COMPARISON:  Chest radiograph dated 07/22/2014.  FINDINGS: There there  Lungs, pleural spaces, and central airways: There are innumerable bilateral pulmonary nodules compatible with metastatic disease. A nodule or Crosser of adjacent nodule in the lingula along the major fissure measures 1.0 x 1.2 cm (series 5, image 162). Trace bilateral pleural effusions.  Vasculature: Mild atherosclerotic calcification of the aorta. No CT evidence of pulmonary embolism. No thoracic aortic aneurysm or dissection.  Heart: Mild cardiomegaly. No pericardial effusion. There is coronary vascular  calcification.  Mediastinum/ lymph nodes: There are bilateral hilar adenopathy. Left hilar lymph node measures to 1.8 x 1.9 cm. There is subcarinal and anterior mediastinal adenopathy. Right paratracheal adenopathy measures up to 1.3 centimeter short axis. Right axillary adenopathy measures up to 1.6 x 1.7 centimeter. Multiple top-normal left axillary and supraclavicular lymph nodes noted. There is submandibular adenopathy.  Chest wall/ musculoskeletal: Degenerative changes of the spine. There is sclerotic changes and osteophyte formation of the inferior endplate of the TW38vertebra. This level is not completely included in the images but likely represent chronic and degenerative changes. No acute fracture. A subcentimeter round right breast calcific focus as well as a 1 centimeter round nodular density noted in the right breast. Correlation with clinical exam and mammography recommended.  Upper abdomen: Moderate size hiatal hernia partially visualized soft tissue density in the region of the porta hepaticus which may represent cluster of enlarged lymph node. Soft tissue mass arising from the pancreas not excluded. Contrast enhanced CT of the abdomen and pelvis is recommended further evaluation. Retroperitoneal adenopathy noted in the upper abdomen.  Review of the MIP images confirms the above findings.  IMPRESSION: No CT evidence of pulmonary embolism.  Innumerable bilateral pulmonary metastatic nodules. Trace bilateral pleural effusions.  Bilateral hilar and mediastinal adenopathy. Right axillary, submandibular, supraclavicular, and retroperitoneal adenopathy.  Partially visualized ill-defined soft tissue density in the porta pedis may represent clusters of enlarged lymph nodes or a mass arising from the head of the pancreas. CT of the abdomen pelvis with contrast is recommended for better evaluation.   Electronically Signed   By: AAnner CreteM.D.   On: 07/22/2014 18:07   Ct Abdomen Pelvis W  Contrast  07/23/2014   CLINICAL DATA:  Shortness of breath, pulmonary metastases of unknown source, CHF  EXAM: CT ABDOMEN AND PELVIS WITH CONTRAST  TECHNIQUE: Multidetector CT imaging of the abdomen and pelvis was performed using the standard protocol following bolus administration of intravenous contrast. Sagittal and coronal MPR images reconstructed from axial data set.  CONTRAST:  1045mOMNIPAQUE IOHEXOL 300 MG/ML SOLN IV. Dilute oral contrast.  COMPARISON:  CT chest 07/22/2014, CT pelvis 01/30/2010  FINDINGS: Numerous bibasilar pulmonary nodules.  Moderate to large hiatal hernia.  9 mm subtle hypervascular nodule lateral segment LEFT lobe liver image 13.  2.4 cm calcified gallstone in gallbladder.  BILATERAL renal cortical soft tissue nodules likely metastatic disease.  Splenic enlargement, 12.1 x 6.5 x 15.5 cm.  Remainder of liver, spleen, pancreas, and adrenal glands normal.  Extensive upper abdominal adenopathy including retrocrural, peripancreatic, periportal, celiac, aortocaval, para-aortic, mesenteric, and and BILATERAL iliac.  15 mm short axis retrocrural node image 16.  20 mm short axis LEFT para-aortic node image 32.  17 mm short axis periaortic node image 38.  Stomach and small bowel loops unremarkable.  Atrophic uterus with unremarkable ovaries.  Normal appendix.  Minimal sigmoid diverticulosis.  No additional mass, free air, free fluid  or hernia.  Bones diffusely demineralized with inferior endplate compression fracture of T12 again seen.  IMPRESSION: Pulmonary metastases.  Extensive abdominal adenopathy.  Mild splenic enlargement.  9 mm hypervascular liver lesion.  Probable BILATERAL renal metastases.  Overall findings are most consistent with lymphoma.  Cholelithiasis.  Hiatal hernia.  Age-indeterminate T12 compression fracture, cannot exclude pathologic fracture.   Electronically Signed   By: Lavonia Dana M.D.   On: 07/23/2014 14:24   Ct Biopsy  07/24/2014   CLINICAL DATA:  Anemia, CLL  EXAM: CT  GUIDED RIGHT ILIAC BONE MARROW ASPIRATION AND CORE BIOPSY  Date:  7/1/20167/01/2014 10:57 am  Radiologist:  M. Daryll Brod, MD  Guidance:  CT  FLUOROSCOPY TIME:  None.  MEDICATIONS AND MEDICAL HISTORY: 1 mg Versed, 50 mcg fentanyl  ANESTHESIA/SEDATION: 10 minutes  CONTRAST:  None.  COMPLICATIONS: None  PROCEDURE: Informed consent was obtained from the patient following explanation of the procedure, risks, benefits and alternatives. The patient understands, agrees and consents for the procedure. All questions were addressed. A time out was performed.  The patient was positioned prone and noncontrast localization CT was performed of the pelvis to demonstrate the iliac marrow spaces.  Maximal barrier sterile technique utilized including caps, mask, sterile gowns, sterile gloves, large sterile drape, hand hygiene, and betadine prep.  Under sterile conditions and local anesthesia, an 11 gauge coaxial bone biopsy needle was advanced into the right iliac marrow space. Needle position was confirmed with CT imaging. Initially, bone marrow aspiration was performed. Next, the 11 gauge outer cannula was utilized to obtain a right iliac bone marrow core biopsy. Needle was removed. Hemostasis was obtained with compression. The patient tolerated the procedure well. Samples were prepared with the cytotechnologist. No immediate complications.  IMPRESSION: CT guided right iliac bone marrow aspiration and core biopsy.   Electronically Signed   By: Jerilynn Mages.  Shick M.D.   On: 07/24/2014 11:12   Dg Chest Portable 1 View  07/22/2014   CLINICAL DATA:  Intermittent shortness of breath for 3 weeks  EXAM: PORTABLE CHEST - 1 VIEW  COMPARISON:  None.  FINDINGS: There is a 9 x 9 mm nodular opacity in the left mid lung region. There is bibasilar interstitial edema. Lungs elsewhere clear. Heart is mildly enlarged with pulmonary vascularity within normal limits. No adenopathy. No bone lesions.  IMPRESSION: 9 x 9 mm nodular opacity left mid lung  region. This nodular opacity potentially could represent a nipple shadow. Repeat study with nipple markers advised. If this structure does not represent a nipple shadow, noncontrast enhanced chest CT to further evaluate would be warranted.  Bibasilar interstitial edema with mild cardiomegaly. Findings are most consistent with a degree of congestive heart failure. There is no airspace consolidation.   Electronically Signed   By: Lowella Grip III M.D.   On: 07/22/2014 08:38    Microbiology: No results found for this or any previous visit (from the past 240 hour(s)).   Labs: Basic Metabolic Panel:  Recent Labs Lab 08/07/14 0737 08/08/14 0510  NA 137 138  K 4.1 4.1  CL 101 95*  CO2 28 33*  GLUCOSE 110* 112*  BUN 35* 40*  CREATININE 0.86 1.22*  CALCIUM 9.7 9.5   Liver Function Tests: No results for input(s): AST, ALT, ALKPHOS, BILITOT, PROT, ALBUMIN in the last 168 hours. No results for input(s): LIPASE, AMYLASE in the last 168 hours. No results for input(s): AMMONIA in the last 168 hours. CBC:  Recent Labs Lab 08/07/14 0737 08/08/14 0510  08/09/14 0838  WBC 3.5* 2.8* 3.6*  NEUTROABS 2.6  --   --   HGB 8.9* 8.3* 10.5*  HCT 29.3* 27.0* 33.6*  MCV 75.3* 75.8* 78.9  PLT 149* 141* 166   Cardiac Enzymes:  Recent Labs Lab 08/07/14 0737  TROPONINI <0.03   BNP: BNP (last 3 results)  Recent Labs  07/22/14 0830 08/07/14 0737  BNP 282.2* 379.6*    ProBNP (last 3 results) No results for input(s): PROBNP in the last 8760 hours.  CBG: No results for input(s): GLUCAP in the last 168 hours.     SignedLouellen Molder  [facility] 08/09/2014, 10:29 AM

## 2014-08-09 NOTE — Progress Notes (Signed)
RN notified PCP that B/P was 106/43. PCP returned call.  Order to Hold Lasix for just now.

## 2014-08-09 NOTE — Progress Notes (Signed)
IP PROGRESS NOTE  Subjective:   Amanda Sellers reports improvement in dyspnea, but she continues to have orthopnea. She feels better on the nasal cannula oxygen.  Objective: Vital signs in last 24 hours: Blood pressure 106/43, pulse 83, temperature 97.6 F (36.4 C), temperature source Oral, resp. rate 14, height '5\' 2"'  (1.575 m), weight 165 lb 9.6 oz (75.116 kg), SpO2 99 %.  Intake/Output from previous day: 07/16 0701 - 07/17 0700 In: 51 [P.O.:480; Blood:300] Out: 500 [Urine:500]  Physical Exam:  Not performed today    Lab Results:  Recent Labs  08/07/14 0737 08/08/14 0510  WBC 3.5* 2.8*  HGB 8.9* 8.3*  HCT 29.3* 27.0*  PLT 149* 141*   Peripheral blood smear 07/29/2014-the white cell morphology is unremarkable. The platelets appear normal in number. Moderate ovalocytes, a few teardrop and cigar cells. The polychromasia is not increased.  BMET  Recent Labs  08/07/14 0737 08/08/14 0510  NA 137 138  K 4.1 4.1  CL 101 95*  CO2 28 33*  GLUCOSE 110* 112*  BUN 35* 40*  CREATININE 0.86 1.22*  CALCIUM 9.7 9.5    Studies/Results: Ct Angio Chest Pe W/cm &/or Wo Cm  08/07/2014   CLINICAL DATA:  Chemotherapy July 12th and 13. Short of breath yesterday. Difficulty breathing. Lymphoma.  EXAM: CT ANGIOGRAPHY CHEST WITH CONTRAST  TECHNIQUE: Multidetector CT imaging of the chest was performed using the standard protocol during bolus administration of intravenous contrast. Multiplanar CT image reconstructions and MIPs were obtained to evaluate the vascular anatomy.  CONTRAST:  61m OMNIPAQUE IOHEXOL 350 MG/ML SOLN  COMPARISON:  CTA 07/22/2014.  FINDINGS: Bones: No aggressive osseous lesions. Sternoclavicular joint osteoarthritis. Exaggerated thoracic kyphosis. Unchanged T12 compression fractures and Schmorl's nodes throughout the thoracic spine.  Cardiovascular: Pulmonary arterial hypertension. Coronary artery atherosclerosis is present. If office based assessment of coronary risk  factors has not been performed, it is now recommended. Mild cardiomegaly. Negative for pulmonary embolism. No acute aortic abnormality.  Lungs: Negative for pneumonia. Multiple pulmonary nodules are unchanged compared to recent prior CT. Dependent atelectasis.  Central airways: Patent.  Effusions: Tiny bilateral dependently layering pleural effusions.  Lymphadenopathy: RIGHT-greater-than- LEFT axillary lymphadenopathy. Mediastinal and hilar lymphadenopathy compatible with lymphoma. This is unchanged compared to recent prior. This includes retrocrural and upper abdominal lymphadenopathy.  Esophagus: Moderate hiatal hernia with the intrathoracic stomach collapsed. No definite esophageal thickening.  Upper abdomen: Upper abdominal adenopathy.  Other: Unchanged appearance of the thyroid gland.  Review of the MIP images confirms the above findings.  IMPRESSION: 1. Negative for pulmonary embolism or acute aortic abnormality. 2. Lymphadenopathy consistent with history of lymphoma. Unchanged innumerable bilateral pulmonary nodules. No superimposed acute cardiopulmonary disease. 3. Pulmonary arterial hypertension and coronary artery disease. 4. Mild cardiomegaly which appears similar to the prior CT 07/22/2014. Unchanged tiny bilateral dependently layering pleural effusions. Given the cardiomegaly and small pleural effusions, there is probably mild CHF.   Electronically Signed   By: GDereck LigasM.D.   On: 08/07/2014 09:41    Medications: I have reviewed the patient's current medications.  Assessment/Plan:  1. Non-Hodgkin's Lymphoma-bone marrow biopsy 07/24/2014 consistent with involvement by follicular B-cell lymphoma, CD20 positive  CTs of the chest 07/22/2014 and CT of the abdomen and pelvis on 07/23/2014-pulmonary nodules, hilar/mediastinal/supraclavicular/axillary adenopathy, splenomegaly, abdominal adenopathy, bilateral adrenal nodules  Cycle 1 bendamustine/rituximab 08/04/2014  2. Severe microcytic  anemia, status post transfusion with packed red blood cells 07/23/2014  Normal ferritin, low transferrin saturation, "scant "bone marrow iron stores, review of her  for blood smear consistent with iron deficiency anemia  3. Exertional dyspnea /orthopnea-most likely secondary to congestive heart failure  4. History of a B-12 deficiency  Amanda Sellers was admitted with exertional dyspnea and orthopnea. Her symptoms are most likely related to congestive heart failure in the setting of severe anemia. I doubt the dyspnea is related to chemotherapy or lymphoma.  Recommendations: 1. Outpatient GI evaluation for the iron deficiency anemia 2. Diuresis, oxygen therapy per the medical service 3. Follow-up as scheduled at the [facility]     LOS: 2 days   [facility], Dominica Severin  08/09/2014, 8:06 AM

## 2014-08-09 NOTE — Care Management Note (Addendum)
Case Management Note  Patient Details  Name: Amanda Sellers MRN: [id number] Date of Birth: [birth year]  Subjective/Objective:     CHF              Action/Plan: Oxygen   Expected Discharge Date:  08/09/2014               Expected Discharge Plan:   In-House Referral:     Discharge planning Services  CM consult  Post Acute Care Choice:    Choice offered to:     DME Arranged:  Oxygen DME Agency:  [facility]   Status of Service:  Completed, signed off  Medicare Important Message Given:    Date Medicare IM Given:    Medicare IM give by:    Date Additional Medicare IM Given:    Additional Medicare Important Message give by:     If discussed at [place] of Stay Meetings, dates discussed:    Additional Comments: Consult for home oxygen at night. NCM spoke to pt and she has 24 hour caregiver at home. Gave permission to speak to dtr, Donella Stade [phone]. Explained [facility] will contact her at home to arrange overnight pulse oximetry and deliver oxygen for home. Contacted [facility] rep, Mandy with new referral. Faxed orders to [facility], fax # [phone]. Please see previous NCM note. NCM spoke to pt and dtr on how to locate a PCP. Dtr to contact [facility] for a provider list.  Erenest Rasher, RN 08/09/2014, 12:27 PM

## 2014-08-11 LAB — TYPE AND SCREEN
ABO/RH(D): AB POS
Antibody Screen: POSITIVE
DAT, IgG: NEGATIVE
PT AG Type: NEGATIVE
Unit division: 0

## 2014-08-11 NOTE — Progress Notes (Addendum)
12:57 NCM spoke to pt's dtr to make her aware that [facility] is scheduled to deliver oxygen today, Provided her with [facility]'s contact number to follow up on on delivery time. Jonnie Finner RN CCM Case Mgmt phone [phone]  12:47 pm NCM contacted [place], [place]. Pt had overnight pulse oximetry and [facility] scheduled to deliver oxygen today to home. Attempted call to pt's home to follow up with dtr, Mauricia Area. No voice mail message. Contacted number for dtr, Mauricia Area. Spoke to her husband and states she is on her way to pt's home. Contacted attending to make aware pt's qualified for oxygen and scheduled deliver today.  Jonnie Finner RN CCM Case Mgmt phone [phone]

## 2014-08-12 ENCOUNTER — Ambulatory Visit (INDEPENDENT_AMBULATORY_CARE_PROVIDER_SITE_OTHER): Payer: Medicare HMO | Admitting: Family Medicine

## 2014-08-12 ENCOUNTER — Encounter: Payer: Self-pay | Admitting: Family Medicine

## 2014-08-12 VITALS — BP 101/65 | HR 76 | Temp 98.1°F | Resp 16 | Ht 64.5 in | Wt 165.0 lb

## 2014-08-12 DIAGNOSIS — I5032 Chronic diastolic (congestive) heart failure: Secondary | ICD-10-CM

## 2014-08-12 DIAGNOSIS — D509 Iron deficiency anemia, unspecified: Secondary | ICD-10-CM | POA: Diagnosis not present

## 2014-08-12 DIAGNOSIS — I499 Cardiac arrhythmia, unspecified: Secondary | ICD-10-CM

## 2014-08-12 DIAGNOSIS — E559 Vitamin D deficiency, unspecified: Secondary | ICD-10-CM

## 2014-08-12 DIAGNOSIS — R195 Other fecal abnormalities: Secondary | ICD-10-CM

## 2014-08-12 DIAGNOSIS — I498 Other specified cardiac arrhythmias: Secondary | ICD-10-CM

## 2014-08-12 MED ORDER — FUROSEMIDE 40 MG PO TABS: ORAL_TABLET | ORAL | Status: DC

## 2014-08-12 NOTE — Progress Notes (Signed)
Pre visit review using our clinic review tool, if applicable. No additional management support is needed unless otherwise documented below in the visit note. 

## 2014-08-12 NOTE — Progress Notes (Signed)
Office Note 08/12/2014  CC:  Chief Complaint  Patient presents with  . Establish Care   HPI:  Amanda Sellers is a 81 y.o. White female who is here with her daughter to establish care, also f/u recent hospitalization 7/14-7/17, 2016. Patient's most recent primary MD: Dr. Greta Doom. Old records in EPIC/[facility] EMR were reviewed prior to or during today's visit.  Feeling pretty good, esp since getting 1L oxygen during sleep--started last night. Taking lasix 40 mg qd at d/c, which was new. Currently on no bp meds due to recent hypotension--has lisinopril to take if SBP >100. Taking ferrous sulfate 2 caps qAM. TSH has been slightly up, had been taking synth with other meds, now taking it qAM w/out other meds and 30 min prior to BF.     Past Medical History  Diagnosis Date  . Vitamin B 12 deficiency   . HTN (hypertension)   . Vitamin D deficiency   . HLD (hyperlipidemia)      06/2014  . Chronic diastolic congestive heart failure     Echo 07/23/14: mild LVH, EF 50-55%, grade 2 diast dysfxn  . Pulmonary metastases 07/24/2014  . Follicular lymphoma     Non Hodgkins B cell lymphoma;    Cycle 1 bendamustine/rituximab 08/04/2014  . Microcytic anemia     transfused 3 U total in hosp 07/2014  . Positive occult stool blood test 08/08/14    set for GI appt Sadie Haber) on 09/08/14  . Hypothyroidism   . Pancytopenia due to antineoplastic chemotherapy     Past Surgical History  Procedure Laterality Date  . Breast biopsy    . Bone marrow biopsy  07/24/14  . Abdominal hysterectomy  1978  . Transthoracic echocardiogram  07/23/14    mild LVH, EF 50-55%, grade 2 diast dysfxn    Family History  Problem Relation Age of Onset  . Melanoma Mother   . Stomach cancer Father   . Lymphoma Sister   . Diabetes Daughter     History   Social History  . Marital Status: Married    Spouse Name: N/A  . Number of Children: 8  . Years of Education: N/A   Occupational History  . retired    Social History  Main Topics  . Smoking status: Never Smoker   . Smokeless tobacco: Not on file  . Alcohol Use: No  . Drug Use: No  . Sexual Activity: Not on file   Other Topics Concern  . Not on file   Social History Narrative   Widowed, has 8 children.   Orig from [place], now lives in [place].   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   No tob/alc/drugs.    Outpatient Encounter Prescriptions as of 08/12/2014  Medication Sig  . ALPRAZolam (XANAX) 0.5 MG tablet Take 1 tablet by mouth every 6 (six) hours as needed. anxiety  . aspirin 81 MG tablet Take 81 mg by mouth daily.  . ergocalciferol (VITAMIN D2) 50000 UNITS capsule Take 50,000 Units by mouth once a week. On Sundays  . ferrous sulfate 325 (65 FE) MG tablet Take 1 tablet (325 mg total) by mouth 2 (two) times daily with a meal.  . fish oil-omega-3 fatty acids 1000 MG capsule Take 2 g by mouth daily.  . furosemide (LASIX) 40 MG tablet 1/2 tab po qd  . levothyroxine (SYNTHROID, LEVOTHROID) 100 MCG tablet Take 1 tablet (100 mcg total) by mouth daily before breakfast.  .  lisinopril (PRINIVIL,ZESTRIL) 40 MG tablet Take 0.5 tablets (20 mg total) by mouth daily.  . OXYGEN Inhale into the lungs. 1Lt at bedtime  . pravastatin (PRAVACHOL) 80 MG tablet Take 80 mg by mouth daily.  Marland Kitchen PRESCRIPTION MEDICATION Chemo  . prochlorperazine (COMPAZINE) 5 MG tablet Take 1 tablet (5 mg total) by mouth every 6 (six) hours as needed for nausea or vomiting.  . [DISCONTINUED] furosemide (LASIX) 40 MG tablet Take 1 tablet (40 mg total) by mouth daily.  . [DISCONTINUED] amLODipine (NORVASC) 2.5 MG tablet Take 2.5 mg by mouth daily.   No facility-administered encounter medications on file as of 08/12/2014.    No Known Allergies  ROS Review of Systems  Constitutional: Negative for fever and fatigue.  HENT: Negative for congestion and sore throat.   Eyes: Negative for visual disturbance.  Respiratory: Negative for cough.    Cardiovascular: Negative for chest pain and palpitations.  Gastrointestinal: Negative for nausea and abdominal pain.  Genitourinary: Negative for dysuria.  Musculoskeletal: Negative for back pain and joint swelling.  Skin: Negative for rash.  Neurological: Negative for weakness and headaches.  Hematological: Does not bruise/bleed easily.  Psychiatric/Behavioral: Negative for dysphoric mood.    PE; Blood pressure 101/65, pulse 76, temperature 98.1 F (36.7 C), temperature source Oral, resp. rate 16, height 5' 4.5" (1.638 m), weight 165 lb (74.844 kg), SpO2 99 %. Gen: Alert, well appearing.  Patient is oriented to person, place, time, and situation. BEE:FEOF: no injection, icteris, swelling, or exudate.  EOMI, PERRLA. Mouth: lips without lesion/swelling.  Oral mucosa pink and moist. Oropharynx without erythema, exudate, or swelling.  Neck - No masses or thyromegaly or limitation in range of motion CV: Irreg irreg rhythm, 1-2/1 systolic murmur heard at bilat upper sternal border.  No rub, no diastolic murmur. Chest is clear, no wheezing or rales. Normal symmetric air entry throughout both lung fields. No chest wall deformities or tenderness. EXT: no clubbing or cyanosis.  1-2+ pitting edema from mid tibia level down into feet.  Pertinent labs:  12 lead EKG today: sinus rhythm, frequent PVCs, ventricular trigeminy, <1 mm horiz ST dep in precordial leads. No signif change compared to EKG 08/08/14.  Lab Results  Component Value Date   WBC 3.6* 08/09/2014   HGB 10.5* 08/09/2014   HCT 33.6* 08/09/2014   MCV 78.9 08/09/2014   PLT 166 08/09/2014     Chemistry      Component Value Date/Time   NA 137 08/09/2014 0838   NA 139 07/29/2014 1354   K 3.8 08/09/2014 0838   K 4.1 07/29/2014 1354   CL 98* 08/09/2014 0838   CO2 30 08/09/2014 0838   CO2 28 07/29/2014 1354   BUN 43* 08/09/2014 0838   BUN 24.1 07/29/2014 1354   CREATININE 1.25* 08/09/2014 0838   CREATININE 0.8 07/29/2014 1354       Component Value Date/Time   CALCIUM 9.3 08/09/2014 0838   CALCIUM 10.2 07/29/2014 1354   ALKPHOS 91 07/29/2014 1354   ALKPHOS 92 07/24/2014 0757   AST 15 07/29/2014 1354   AST 16 07/24/2014 0757   ALT 11 07/29/2014 1354   ALT 10* 07/24/2014 0757   BILITOT 0.54 07/29/2014 1354   BILITOT 1.0 07/24/2014 0757     Lab Results  Component Value Date   TSH 8.644* 07/22/2014   ASSESSMENT AND PLAN:   1) Chronic diastolic CHF: blood pressures a little low (90s syst) at home, I feel like she may be a bit on the dry  side now. I will decrease her lasix to 1/2 of a 83m tab once daily. Check BMET today.  She has instructions to start low dose ACE-I IF SBP > 100. Continue oxygen 1 L [place] with sleep. Discussed importance of Na restriction, daily wt, and symptom monitoring.  2) Microcytic anemia, hem pos stool: hold ASA, no NSAIDs.  Has outpt GI appt with Dr. MWatt Climeswith [facility] set up for middle of next month. Recheck CBC today, continue ferrous sulfate 3250m 2 tabs qd.  3) Hypothyroidism: TSH recently up, med titrated up a couple of times, then daughter realized pt was taking med with other meds.  She just started taking her synthroid correctly 2 days ago.  Plan on recheck TSH at next f/u in 1 mo.  4) NHL with pulm lesions and intra-abdominal adenopathy: continue chemo with Dr. ShBenay Spice Per pt, remission is expected with 6 rounds of chemo.  5) Abnormal EKG: with her frequent PVCs/ventricular trigeminy as well as her known diastolic dysfunction (plus expected remission with cancer treatment), I have recommended she get established with a cardiologist and she was agreeable to this.  I ordered this today.  6) Hx of vit D deficiency: on high dose replacement therapy long term per pt: recheck vit D level today.  7) Hx of vit B12 deficiency: recent B12 level OFF of B12 injections was great.  Continue oral supplementation.  Spent 50 min with pt today, with >50% of this time spent in counseling  and care coordination regarding the above problems.  Return in about 4 weeks (around 09/09/2014) for routine chronic illness f/u.

## 2014-08-13 LAB — BASIC METABOLIC PANEL
BUN: 28 mg/dL — ABNORMAL HIGH (ref 6–23)
CO2: 31 mEq/L (ref 19–32)
Calcium: 9.5 mg/dL (ref 8.4–10.5)
Chloride: 101 mEq/L (ref 96–112)
Creatinine, Ser: 1.02 mg/dL (ref 0.40–1.20)
GFR: 55.41 mL/min — ABNORMAL LOW (ref >60.00)
Glucose, Bld: 118 mg/dL — ABNORMAL HIGH (ref 70–99)
Potassium: 4.5 mEq/L (ref 3.5–5.1)
Sodium: 140 mEq/L (ref 135–145)

## 2014-08-13 LAB — CBC WITH DIFFERENTIAL/PLATELET
Basophils Absolute: 0 10*3/uL (ref 0.0–0.1)
Basophils Relative: 0.8 % (ref 0.0–3.0)
Eosinophils Absolute: 0.1 10*3/uL (ref 0.0–0.7)
Eosinophils Relative: 1.7 % (ref 0.0–5.0)
HCT: 31 % — ABNORMAL LOW (ref 36.0–46.0)
Hemoglobin: 10 g/dL — ABNORMAL LOW (ref 12.0–15.0)
Lymphocytes Relative: 11.8 % — ABNORMAL LOW (ref 12.0–46.0)
Lymphs Abs: 0.4 10*3/uL — ABNORMAL LOW (ref 0.7–4.0)
MCHC: 32.3 g/dL (ref 30.0–36.0)
MCV: 76 fl — ABNORMAL LOW (ref 78.0–100.0)
Monocytes Absolute: 0.6 10*3/uL (ref 0.1–1.0)
Monocytes Relative: 16.3 % — ABNORMAL HIGH (ref 3.0–12.0)
Neutro Abs: 2.6 10*3/uL (ref 1.4–7.7)
Neutrophils Relative %: 69.4 % (ref 43.0–77.0)
Platelets: 227 10*3/uL (ref 150.0–400.0)
RBC: 4.07 Mil/uL (ref 3.87–5.11)
RDW: 21.9 % — ABNORMAL HIGH (ref 11.5–15.5)
WBC: 3.7 10*3/uL — ABNORMAL LOW (ref 4.0–10.5)

## 2014-08-13 LAB — VITAMIN D 25 HYDROXY (VIT D DEFICIENCY, FRACTURES): VITD: 33.81 ng/mL (ref 30.00–100.00)

## 2014-08-14 ENCOUNTER — Encounter (HOSPITAL_COMMUNITY): Payer: Self-pay

## 2014-08-17 ENCOUNTER — Ambulatory Visit (INDEPENDENT_AMBULATORY_CARE_PROVIDER_SITE_OTHER): Payer: Medicare HMO | Admitting: Cardiology

## 2014-08-17 ENCOUNTER — Encounter: Payer: Self-pay | Admitting: Cardiology

## 2014-08-17 ENCOUNTER — Encounter: Payer: Self-pay | Admitting: Oncology

## 2014-08-17 VITALS — BP 128/76 | HR 88 | Ht 64.5 in | Wt 167.3 lb

## 2014-08-17 DIAGNOSIS — R0600 Dyspnea, unspecified: Secondary | ICD-10-CM

## 2014-08-17 DIAGNOSIS — D649 Anemia, unspecified: Secondary | ICD-10-CM | POA: Diagnosis not present

## 2014-08-17 DIAGNOSIS — I10 Essential (primary) hypertension: Secondary | ICD-10-CM | POA: Diagnosis not present

## 2014-08-17 DIAGNOSIS — I5032 Chronic diastolic (congestive) heart failure: Secondary | ICD-10-CM | POA: Diagnosis not present

## 2014-08-17 MED ORDER — LISINOPRIL 2.5 MG PO TABS: 2.5000 mg | ORAL_TABLET | Freq: Every day | ORAL | Status: DC

## 2014-08-17 NOTE — Patient Instructions (Addendum)
DISCONTINUE LISINOPRIL 40 MG . START LISINOPRIL 2.5 MG TABLET ---TAKE AT LUNCH TIME  START TAKING LASIX ( FUROSEMIDE) AFTER BREAKFAST. Sliding scale Lasix: Weigh yourself when you get home, then Daily in the Morning. Your dry weight will be what your scale says on the day you return home.(here is 167 lbs.).   If you gain more than 3 pounds from dry weight: Increase the Lasix dosing to 40mg  mg in the morning after breakfast until back to origianl dry weight  If weight gain is greater than 5 pounds in 2 days: Increased to Lasix 40 mg in AM & 20mg  4-6 hrs later - then contact the office for further assistance if weight does not go down the next day.  If the weight goes down more than 3 pounds from dry weight: Hold Lasix until it returns to baseline dry weight  THE DAYS THAT YOU GO TO CHEMO  DO NOT TAKE LISINOPRIL AND LASIX   Your physician wants you to follow-up in 2-3 MONTHS DR Autry Prust -30 MIN APPOINTMENT.  You will receive a reminder letter in the mail two months in advance. If you don't receive a letter, please call our office to schedule the follow-up appointment.

## 2014-08-17 NOTE — Progress Notes (Signed)
fmla forms are ready for pick up for daugther tammy chilton.

## 2014-08-17 NOTE — Progress Notes (Signed)
PATIENT: Amanda Sellers MRN: [id number] DOB: July 19, 1932 PCP: Tammi Sou, MD  Clinic Note: Chief Complaint  Patient presents with  . Establish Care     no chest pain, occassional shortness of breath, occassional edema, no pain in legs, no cramping in legs, no lightheadedness, no dizziness  . Congestive Heart Failure    Diastolic  Extensive records from 2 recent hospitalizations and PCP clinic note were reviewed.  HPI: Amanda Sellers is a 81 y.o. female with a PMH below who presents today for establish cardiology care her diastolic heart failure.. 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 [name] is a chest pain. She was initially diagnosed with bronchitis at her care but symptoms did not improve. There was no comment of weight gain or swelling. In the ER she was noted to have a mildly elevated BNP level of 282 in the setting of anemia with hemoglobin of 8.3.she was treated with 2 doses of IV Lasix and transfused 2 units PRBC (blood)ated with HCTZ and told to start low-sodium diet for her diastolic dysfunction noted on her echo.she was not discharged on Lasix.   Echo 6/29/'16:  - Left ventricle: The cavity size was normal.  mild LVH.   EF 50% to 55%.  No regional wall motion abnormalities.    grade 2 diastolic dysfunction - Aortic valve: There was trivial regurgitation. - Mitral valve: Calcified annulus. - Left atrium: The atrium was moderately dilated. - Pulmonary arteries: PA peak pressure: 31 mm Hg (S).   CT abdomen pelvis and chest: Pulmonary metastases with extensive abdominal adenopathy, mild splenic enlargement and probable lateral renal metastases. --> Findings most consistent with lymphoma.  Bone marrow biopsy by Dr. Ammie Dalton --> eventually diagnosed with follicular lymphoma (non-Hodgkin's B cell lymphoma)  She was then admitted again from the 15th July 17.-- presumed diagnosis of acute diastolic heart  failure.  Shortly after starting chemotherapy (first cycle of bendamustine/rituximab on 08/04/2014), he had developed progressively worsening dyspnea on exertion, orthopnea. She felt tired and noticed mild lower extreme edema.  She is still on the anemic but less significantly than the previous hospitalization. Her hemoglobin was 8.9. BNP 380.she was treated number Ivin Booty with IV Lasix 40 mg and admitted to telemetry. She was transfused one more unit of blood.  She is scheduled to see Dr. Watt Climes [facility]) for concerns of dark her stools.  She was discharged on 1-2LPM O2 by Haliimaile C. Date orthopnea and presumed nocturnal hypoxia.  She was discharged to establish new PCP (Dr. Anitra Lauth)  Discharge medications included 40 mg Lasix daily , amlodipine 2.5 mg,lisinopril 20 mg. HCTZ was discontinued   Seen by her PCP on July 20:  At that time she was also taking 40 mg of Lasix daily. Monitor her medications were stopped due to hypotension. Her Lasix dose was reduced to 1m.  Minor staining of this she has been taking 5 mg of lisinopril for her systolic pressure greater than 100. She's only been taking this every other day.  EKG was noted to have frequent PVCs with occasional ventricular trigeminy. She is now referred for cardiology evaluation.  Interval History: The patient presents today with no acute problems besides the fact that she is still quite dizzy and weak with most the time having blood pressures in the 80s to 90s. She has not had much of any energy. That he said, she has not noticed nearly the amount of lower shin edema  and orthopnea she had before. Where the night and often has notably improved her symptoms. Overall she feels much better from a respiratory standpoint. She is normal using a walker but is walking with a cane. From a cardiac standpoint, she has not had any significant chest tightness or pressure with rest or exertion. With her nighttime oxygen she is no longer having PND orthopnea,  and her edema is relatively stable. By the end of the day she may have 1+ lower extremity edema but nothing worse. This is not a change for her.  The remainder of cardiac review of systems is as follows: Cardiovascular ROS: positive for - orthopnea, shortness of breath and But notably improved. Wearing home oxygen. Mild edema negative for - irregular heartbeat, loss of consciousness, palpitations, paroxysmal nocturnal dyspnea, rapid heart rate or  syncope/near-syncope TIA/amaurosis fugax    Past Medical History  Diagnosis Date  . Vitamin B 12 deficiency   . HTN (hypertension)   . Vitamin D deficiency   . HLD (hyperlipidemia)      06/2014  . Chronic diastolic congestive heart failure     Echo 07/23/14: mild LVH, EF 50-55%, grade 2 diast dysfxn  . Pulmonary metastases 07/24/2014  . Follicular lymphoma     Non Hodgkins B cell lymphoma;    Cycle 1 bendamustine/rituximab 08/04/2014  . Microcytic anemia     transfused 3 U total in hosp 07/2014  . Positive occult stool blood test 08/08/14    set for GI appt Sadie Haber) on 09/08/14  . Hypothyroidism   . Pancytopenia due to antineoplastic chemotherapy     Prior Cardiac Evaluation and Past Surgical History: Past Surgical History  Procedure Laterality Date  . Breast biopsy    . Bone marrow biopsy  07/24/14  . Abdominal hysterectomy  1978  . Transthoracic echocardiogram  07/23/14    mild LVH, EF 50-55%, grade 2 diast dysfxn    No Known Allergies  Current Outpatient Prescriptions  Medication Sig Dispense Refill  . ALPRAZolam (XANAX) 0.5 MG tablet Take 1 tablet by mouth every 6 (six) hours as needed. anxiety    . aspirin 81 MG tablet Take 81 mg by mouth daily.    . ergocalciferol (VITAMIN D2) 50000 UNITS capsule Take 50,000 Units by mouth once a week. On Sundays    . ferrous sulfate 325 (65 FE) MG tablet Take 1 tablet (325 mg total) by mouth 2 (two) times daily with a meal. 30 tablet 1  . fish oil-omega-3 fatty acids 1000 MG capsule Take 2 g by  mouth daily.    . furosemide (LASIX) 40 MG tablet 1/2 tab po qd 30 tablet 2  . levothyroxine (SYNTHROID, LEVOTHROID) 100 MCG tablet Take 1 tablet (100 mcg total) by mouth daily before breakfast. 30 tablet 0  . OXYGEN Inhale into the lungs. 1Lt at bedtime    . pravastatin (PRAVACHOL) 80 MG tablet Take 80 mg by mouth daily.    Marland Kitchen PRESCRIPTION MEDICATION Chemo    . prochlorperazine (COMPAZINE) 10 MG tablet Take 1 tablet by mouth 3 (three) times daily. Take 1 tab three times a day before meals    . prochlorperazine (COMPAZINE) 5 MG tablet Take 1 tablet (5 mg total) by mouth every 6 (six) hours as needed for nausea or vomiting. 30 tablet 1  . lisinopril (PRINIVIL,ZESTRIL) 2.5 MG tablet Take 1 tablet (2.5 mg total) by mouth daily. 90 tablet 3   No current facility-administered medications for this visit.    History   Social  History Narrative   Widowed, has 8 children.   Orig from [place], now lives in [place].   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   No tob/alc/drugs.    family history includes Diabetes in her daughter; Lymphoma in her sister; Melanoma in her mother; Stomach cancer in her father.  ROS: A comprehensive Review of Systems -  Review of Systems  Constitutional: Positive for malaise/fatigue (feels tired after chemo).  HENT: Negative for nosebleeds.   Cardiovascular: Negative for claudication.  Gastrointestinal: Positive for nausea (associated with Chemo). Negative for blood in stool and melena (Does have dark stool - plans to see GI MD).  Genitourinary: Negative for hematuria.  Musculoskeletal: Positive for joint pain. Negative for falls.  Skin: Positive for rash (Starting to get about mild rash on her arms. Red).  Neurological: Positive for dizziness (with low BP).  Endo/Heme/Allergies: Does not bruise/bleed easily.  Psychiatric/Behavioral: Negative.   All other systems reviewed and are negative.   PHYSICAL EXAM BP 128/76 mmHg   Pulse 88  Ht 5' 4.5" (1.638 m)  Wt 167 lb 5 oz (75.892 kg)  BMI 28.29 kg/m2 General appearance: alert, cooperative, appears stated age, no distress and Relatively healthy appearing. Pleasant mood and affect Neck: no adenopathy, no carotid bruit and no JVD Lungs: normal percussion bilaterally and Mild, diffuse interstitial sounds bilaterally. Nonlabored. Good air movement. Heart: Mostly RRR with ectopy. Nondisplaced PMI. Salt 1-2/6 SEM. No R./G. Abdomen: soft, non-tender; bowel sounds normal; no masses,  no organomegaly Extremities: Mild 1+ edema bilaterally distal leg to ankles Pulses: 2+ and symmetric Skin: Skin color, texture, turgor normal. No lesions. Mild erythematous rash on arms Neurologic: Alert and oriented X 3, normal strength and tone. Normal symmetric reflexes. Normal coordination and gait. CN II-XII grossly intact.   Adult ECG Report - not checked  Recent Labs:  Lab Results  Component Value Date   CREATININE 1.02 08/12/2014   Lab Results  Component Value Date   K 4.5 08/12/2014    ASSESSMENT / PLAN: Very pleasant elderly woman with an recent diagnosis of diastolic heart failure secondary to hypertension. She is also undergoing chemotherapy. I think on discharge from the hospital she was on a pretty significant dose of ACE inhibitor which is ordered reduce to 5 or 10 mg from the original 40 mg. She has been noticing dizziness and fatigue. I think she needs to have a lower dose.  Problem List Items Addressed This Visit    Chronic diastolic CHF (congestive heart failure), NYHA class 2 - Primary (Chronic)    She does indeed have a diastolic dysfunction on her echocardiogram. However she has also been anemic and has pulmonary nodules both of which can make her dyspnea worse. Mild diastolic heart failure would not explain nocturnal hypoxia. She does not have pulmonary edema on exam. Her blood pressure has recovered some study patient is to be on afterload reduction so we will  restart low-dose lisinopril 2.5 mg. I recommended that she takes her furosemide after breakfast to avoid orthostatic symptoms. We discussed sliding scale Lasix with daily weights. I also recommended that the day she has chemotherapy she would not take her lisinopril and wait until after to consider Lasix if she feels more short of breath or edematous.      Relevant Medications   lisinopril (PRINIVIL,ZESTRIL) 2.5 MG tablet   Dyspnea    Most likely this is multifactorial. It itself, having grade 2 diastolic dysfunction does not  automatically insinuate diastolic heart failure. Her only exacerbation today was in the setting of profound anemia. Her symptoms are clearly multifactorial with diastolic dysfunction being a component. Currently her symptoms are class I and maybe 2 if she becomes anemic      Essential hypertension (Chronic)    Blood pressure Today he looks pretty good - but she has had some hypotension episodes.. She should the able to tolerate low-dose ACE inhibitor for afterload reduction. No sign of cough therefore they were fine with ACE inhibitor as opposed ARB. Hold ACE inhibitor for positional dizziness symptoms -- especially if blood pressures less than 100.      Relevant Medications   lisinopril (PRINIVIL,ZESTRIL) 2.5 MG tablet   Symptomatic anemia    Lead him susceptible to be symptomatic with her anemia. Her levels I have maintained more steady count. As such, her dyspnea has improved.         Meds ordered this encounter  Medications  . prochlorperazine (COMPAZINE) 10 MG tablet    Sig: Take 1 tablet by mouth 3 (three) times daily. Take 1 tab three times a day before meals  . lisinopril (PRINIVIL,ZESTRIL) 2.5 MG tablet    Sig: Take 1 tablet (2.5 mg total) by mouth daily.    Dispense:  90 tablet    Refill:  3   DISCONTINUE LISINOPRIL 40 MG . START LISINOPRIL 2.5 MG TABLET ---TAKE AT LUNCH TIME  START TAKING LASIX ( FUROSEMIDE) AFTER BREAKFAST. Sliding scale  Lasix: Weigh yourself when you get home, then Daily in the Morning. Your dry weight will be what your scale says on the day you return home.(here is 167 lbs.).   If you gain more than 3 pounds from dry weight: Increase the Lasix dosing to 73m mg in the morning after breakfast until back to origianl dry weight  If weight gain is greater than 5 pounds in 2 days: Increased to Lasix 40 mg in AM & 278m4-6 hrs later - then contact the office for further assistance if weight does not go down the next day.  If the weight goes down more than 3 pounds from dry weight: Hold Lasix until it returns to baseline dry weight  THE DAYS THAT YOU GO TO CHEMO  DO NOT TAKE LISINOPRIL AND LASIX   Your physician wants you to follow-up in 2-3 MONTHS DR Kinser Fellman -30 MIN APPOINTMENT.   Previn Jian W. HAEllyn HackM.D., M.S. Interventional Cardiolgy [facility]

## 2014-08-17 NOTE — Progress Notes (Signed)
I placed fmla form for Amanda Sellers on desk of nurse for dr. Benay Spice.

## 2014-08-18 ENCOUNTER — Telehealth: Payer: Self-pay | Admitting: Nurse Practitioner

## 2014-08-18 ENCOUNTER — Telehealth: Payer: Self-pay | Admitting: *Deleted

## 2014-08-18 ENCOUNTER — Other Ambulatory Visit (HOSPITAL_BASED_OUTPATIENT_CLINIC_OR_DEPARTMENT_OTHER): Payer: Medicare HMO

## 2014-08-18 ENCOUNTER — Ambulatory Visit (HOSPITAL_BASED_OUTPATIENT_CLINIC_OR_DEPARTMENT_OTHER): Payer: Medicare HMO | Admitting: Nurse Practitioner

## 2014-08-18 VITALS — BP 135/66 | HR 88 | Temp 97.9°F | Resp 18 | Ht 64.5 in | Wt 168.2 lb

## 2014-08-18 DIAGNOSIS — R0600 Dyspnea, unspecified: Secondary | ICD-10-CM | POA: Diagnosis not present

## 2014-08-18 DIAGNOSIS — R21 Rash and other nonspecific skin eruption: Secondary | ICD-10-CM

## 2014-08-18 DIAGNOSIS — D509 Iron deficiency anemia, unspecified: Secondary | ICD-10-CM

## 2014-08-18 DIAGNOSIS — C8290 Follicular lymphoma, unspecified, unspecified site: Secondary | ICD-10-CM

## 2014-08-18 DIAGNOSIS — R0601 Orthopnea: Secondary | ICD-10-CM

## 2014-08-18 LAB — CBC WITH DIFFERENTIAL/PLATELET
BASO%: 0.7 % (ref 0.0–2.0)
Basophils Absolute: 0 10*3/uL (ref 0.0–0.1)
EOS%: 3.9 % (ref 0.0–7.0)
Eosinophils Absolute: 0.2 10*3/uL (ref 0.0–0.5)
HCT: 28.7 % — ABNORMAL LOW (ref 34.8–46.6)
HGB: 9.5 g/dL — ABNORMAL LOW (ref 11.6–15.9)
LYMPH%: 9.7 % — ABNORMAL LOW (ref 14.0–49.7)
MCH: 25.1 pg (ref 25.1–34.0)
MCHC: 33.3 g/dL (ref 31.5–36.0)
MCV: 75.5 fL — ABNORMAL LOW (ref 79.5–101.0)
MONO#: 0.8 10*3/uL (ref 0.1–0.9)
MONO%: 18.8 % — ABNORMAL HIGH (ref 0.0–14.0)
NEUT#: 3 10*3/uL (ref 1.5–6.5)
NEUT%: 66.9 % (ref 38.4–76.8)
Platelets: 196 10*3/uL (ref 145–400)
RBC: 3.8 10*6/uL (ref 3.70–5.45)
RDW: 21.2 % — ABNORMAL HIGH (ref 11.2–14.5)
WBC: 4.5 10*3/uL (ref 3.9–10.3)
lymph#: 0.4 10*3/uL — ABNORMAL LOW (ref 0.9–3.3)

## 2014-08-18 LAB — CHCC SMEAR

## 2014-08-18 LAB — FERRITIN CHCC: Ferritin: 428 ng/ml — ABNORMAL HIGH (ref 9–269)

## 2014-08-18 NOTE — Progress Notes (Addendum)
  [facility] OFFICE PROGRESS NOTE   Diagnosis:  Non-Hodgkin's lymphoma  INTERVAL HISTORY:   Amanda Sellers returns as scheduled. She completed cycle 1 bendamustine/Rituxan beginning 08/04/2014. She denies nausea/vomiting. No mouth sores. No diarrhea. No signs of a reaction with the Rituxan infusion. She thinks the lymph nodes are smaller. On 08/16/2014 she noticed an erythematous rash on the trunk and lower extremities. The rash was initially pruritic. The pruritus has improved. She is on no new medications. No new lotions or soaps.  She was hospitalized 08/07/2014 through 08/09/2014 with CHF, anemia. She received red cell transfusion support.  Objective:  Vital signs in last 24 hours:  Blood pressure 135/66, pulse 88, temperature 97.9 F (36.6 C), temperature source Oral, resp. rate 18, height 5' 4.5" (1.638 m), weight 168 lb 3.2 oz (76.295 kg), SpO2 100 %.    HEENT: No thrush or ulcers. Lymphatics:  Small right axillary lymph node. Resp: Lungs clear bilaterally. Cardio: Regular rate and rhythm. GI: Abdomen soft and nontender. No organomegaly. No mass. Vascular: Trace lower leg edema bilaterally.  Skin: Erythematous maculopapular rash over the trunk and lower extremities.    Lab Results:  Lab Results  Component Value Date   WBC 4.5 08/18/2014   HGB 9.5* 08/18/2014   HCT 28.7* 08/18/2014   MCV 75.5* 08/18/2014   PLT 196 08/18/2014   NEUTROABS 3.0 08/18/2014  Peripheral blood smear 07/29/2014-the white cell morphology is unremarkable. The platelets appear normal in number. Moderate ovalocytes, a few teardrop and cigar cells. The polychromasia is not increased  Imaging:  No results found.  Medications: I have reviewed the patient's current medications.  Assessment/Plan: 1. Non-Hodgkin's Lymphoma-bone marrow biopsy 07/24/2014 consistent with involvement by follicular B-cell lymphoma, CD20 positive  CTs of the chest 07/22/2014 and CT of the abdomen and pelvis  on 07/23/2014-pulmonary nodules, hilar/mediastinal/supraclavicular/axillary adenopathy, splenomegaly, abdominal adenopathy, bilateral adrenal nodules  Cycle 1 bendamustine/rituximab 08/04/2014  2. Severe microcytic anemia, status post transfusion with packed red blood cells 07/23/2014  Normal ferritin, low transferrin saturation, "scant" bone marrow iron stores, review of peripheral blood smear consistent with iron deficiency anemia  She has been referred for outpatient GI evaluation  3. Exertional dyspnea /orthopnea-most likely secondary to congestive heart failure  4. History of a B-12 deficiency  5.  Rash over the trunk and extremities 08/18/2014. Appears consistent with a drug rash.  6.  Colonoscopy 06/03/2008. Medium sized internal hemorrhoids.   Disposition: [name] appears stable. She has completed 1 cycle of bendamustine/Rituxan. Overall she appears to have tolerated treatment well. We are scheduling her to begin cycle 2 on 09/01/2014.  The etiology of the rash is unclear. It appears most consistent with a drug rash. She will contact the office if the rash worsens.  We will see her in follow-up on 08/28/2014. She will contact the office in the interim as outlined above or with any other problems.  Patient seen with Dr. Benay Spice.      Amanda Sellers ANP/GNP-BC   08/18/2014  11:48 AM  This was a shared visit with Amanda Sellers. Amanda Sellers was interviewed and examined. She appears to have a drug rash that is resolving.  Her performance status has improved. She will return for an office visit prior to cycle 2 bendamustine/rituximab.  Julieanne Manson, M.D.

## 2014-08-18 NOTE — Telephone Encounter (Signed)
Per staff message and POF I have scheduled appts. Advised scheduler of appts. JMW  

## 2014-08-18 NOTE — Telephone Encounter (Signed)
per pof to sch pt appt-gave pt copy of avs-sent MW email to sch trmt °

## 2014-08-25 ENCOUNTER — Encounter: Payer: Self-pay | Admitting: Cardiology

## 2014-08-25 NOTE — Assessment & Plan Note (Signed)
Blood pressure Today he looks pretty good - but she has had some hypotension episodes.. She should the able to tolerate low-dose ACE inhibitor for afterload reduction. No sign of cough therefore they were fine with ACE inhibitor as opposed ARB. Hold ACE inhibitor for positional dizziness symptoms -- especially if blood pressures less than 100.

## 2014-08-25 NOTE — Assessment & Plan Note (Signed)
She does indeed have a diastolic dysfunction on her echocardiogram. However she has also been anemic and has pulmonary nodules both of which can make her dyspnea worse. Mild diastolic heart failure would not explain nocturnal hypoxia. She does not have pulmonary edema on exam. Her blood pressure has recovered some study patient is to be on afterload reduction so we will restart low-dose lisinopril 2.5 mg. I recommended that she takes her furosemide after breakfast to avoid orthostatic symptoms. We discussed sliding scale Lasix with daily weights. I also recommended that the day she has chemotherapy she would not take her lisinopril and wait until after to consider Lasix if she feels more short of breath or edematous.

## 2014-08-25 NOTE — Assessment & Plan Note (Signed)
Most likely this is multifactorial. It itself, having grade 2 diastolic dysfunction does not automatically insinuate diastolic heart failure. Her only exacerbation today was in the setting of profound anemia. Her symptoms are clearly multifactorial with diastolic dysfunction being a component. Currently her symptoms are class I and maybe 2 if she becomes anemic

## 2014-08-25 NOTE — Assessment & Plan Note (Signed)
Lead him susceptible to be symptomatic with her anemia. Her levels I have maintained more steady count. As such, her dyspnea has improved.

## 2014-08-28 ENCOUNTER — Other Ambulatory Visit (HOSPITAL_BASED_OUTPATIENT_CLINIC_OR_DEPARTMENT_OTHER): Payer: Medicare HMO

## 2014-08-28 ENCOUNTER — Other Ambulatory Visit: Payer: Self-pay | Admitting: Nurse Practitioner

## 2014-08-28 ENCOUNTER — Ambulatory Visit (HOSPITAL_BASED_OUTPATIENT_CLINIC_OR_DEPARTMENT_OTHER): Payer: Medicare HMO | Admitting: Nurse Practitioner

## 2014-08-28 ENCOUNTER — Telehealth: Payer: Self-pay | Admitting: Nurse Practitioner

## 2014-08-28 VITALS — BP 146/59 | HR 98 | Temp 97.9°F | Resp 18 | Ht 64.5 in | Wt 168.2 lb

## 2014-08-28 DIAGNOSIS — C8290 Follicular lymphoma, unspecified, unspecified site: Secondary | ICD-10-CM

## 2014-08-28 DIAGNOSIS — D509 Iron deficiency anemia, unspecified: Secondary | ICD-10-CM | POA: Diagnosis not present

## 2014-08-28 DIAGNOSIS — R0601 Orthopnea: Secondary | ICD-10-CM | POA: Diagnosis not present

## 2014-08-28 DIAGNOSIS — R0600 Dyspnea, unspecified: Secondary | ICD-10-CM | POA: Diagnosis not present

## 2014-08-28 LAB — COMPREHENSIVE METABOLIC PANEL (CC13)
ALT: 15 U/L (ref 0–55)
AST: 21 U/L (ref 5–34)
Albumin: 3.4 g/dL — ABNORMAL LOW (ref 3.5–5.0)
Alkaline Phosphatase: 81 U/L (ref 40–150)
Anion Gap: 6 mEq/L (ref 3–11)
BUN: 19.2 mg/dL (ref 7.0–26.0)
CO2: 29 mEq/L (ref 22–29)
Calcium: 8.5 mg/dL (ref 8.4–10.4)
Chloride: 105 mEq/L (ref 98–109)
Creatinine: 0.7 mg/dL (ref 0.6–1.1)
EGFR: 78 mL/min/{1.73_m2} — ABNORMAL LOW (ref >90)
Glucose: 96 mg/dl (ref 70–140)
Potassium: 3.9 mEq/L (ref 3.5–5.1)
Sodium: 140 mEq/L (ref 136–145)
Total Bilirubin: 0.62 mg/dL (ref 0.20–1.20)
Total Protein: 6.1 g/dL — ABNORMAL LOW (ref 6.4–8.3)

## 2014-08-28 LAB — CBC WITH DIFFERENTIAL/PLATELET
BASO%: 1.3 % (ref 0.0–2.0)
Basophils Absolute: 0 10*3/uL (ref 0.0–0.1)
EOS%: 9.4 % — ABNORMAL HIGH (ref 0.0–7.0)
Eosinophils Absolute: 0.3 10*3/uL (ref 0.0–0.5)
HCT: 25.9 % — ABNORMAL LOW (ref 34.8–46.6)
HGB: 8.6 g/dL — ABNORMAL LOW (ref 11.6–15.9)
LYMPH%: 11.8 % — ABNORMAL LOW (ref 14.0–49.7)
MCH: 25.6 pg (ref 25.1–34.0)
MCHC: 33.1 g/dL (ref 31.5–36.0)
MCV: 77.4 fL — ABNORMAL LOW (ref 79.5–101.0)
MONO#: 0.7 10*3/uL (ref 0.1–0.9)
MONO%: 20.4 % — ABNORMAL HIGH (ref 0.0–14.0)
NEUT#: 1.9 10*3/uL (ref 1.5–6.5)
NEUT%: 57.1 % (ref 38.4–76.8)
Platelets: 130 10*3/uL — ABNORMAL LOW (ref 145–400)
RBC: 3.35 10*6/uL — ABNORMAL LOW (ref 3.70–5.45)
RDW: 25.9 % — ABNORMAL HIGH (ref 11.2–14.5)
WBC: 3.4 10*3/uL — ABNORMAL LOW (ref 3.9–10.3)
lymph#: 0.4 10*3/uL — ABNORMAL LOW (ref 0.9–3.3)

## 2014-08-28 NOTE — Progress Notes (Signed)
  [facility] OFFICE PROGRESS NOTE   Diagnosis:  Non-Hodgkin's lymphoma  INTERVAL HISTORY:   [name] returns as scheduled. She completed cycle 1 bendamustine/Rituxan beginning 08/04/2014. She feels well. She denies nausea/vomiting. No mouth sores. No diarrhea. She notes that her stools are black. She is taking oral iron. The rash has resolved. No fevers or sweats. She denies shortness of breath. No chest pain.  Objective:  Vital signs in last 24 hours:  Blood pressure 146/59, pulse 98, temperature 97.9 F (36.6 C), temperature source Oral, resp. rate 18, height 5' 4.5" (1.638 m), weight 168 lb 3.2 oz (76.295 kg), SpO2 99 %.    HEENT: No thrush or ulcers. Lymphatics: No palpable cervical, supraclavicular or axillary lymph nodes. Question prominent bilateral axillary fat pads right greater than left. Resp: Lungs clear bilaterally. Cardio: Regular rate and rhythm. GI: Abdomen soft and nontender. No hepatomegaly. Vascular: Trace lower leg edema bilaterally. Skin: No rash.    Lab Results:  Lab Results  Component Value Date   WBC 3.4* 08/28/2014   HGB 8.6* 08/28/2014   HCT 25.9* 08/28/2014   MCV 77.4* 08/28/2014   PLT 130* 08/28/2014   NEUTROABS 1.9 08/28/2014    Imaging:  No results found.  Medications: I have reviewed the patient's current medications.  Assessment/Plan: 1. Non-Hodgkin's Lymphoma-bone marrow biopsy 07/24/2014 consistent with involvement by follicular B-cell lymphoma, CD20 positive  CTs of the chest 07/22/2014 and CT of the abdomen and pelvis on 07/23/2014-pulmonary nodules, hilar/mediastinal/supraclavicular/axillary adenopathy, splenomegaly, abdominal adenopathy, bilateral adrenal nodules  Cycle 1 bendamustine/rituximab 08/04/2014  2. Severe microcytic anemia, status post transfusion with packed red blood cells 07/23/2014  Normal ferritin, low transferrin saturation, "scant" bone marrow iron stores, review of peripheral blood smear  consistent with iron deficiency anemia  She has been referred for outpatient GI evaluation  3. Exertional dyspnea /orthopnea-most likely secondary to congestive heart failure  4. History of a B-12 deficiency  5. Rash over the trunk and extremities 08/18/2014. Appears consistent with a drug rash. Resolved 08/28/2014.  6. Colonoscopy 06/03/2008. Medium sized internal hemorrhoids.   Disposition: Amanda Sellers appears stable. She has completed 1 cycle of bendamustine/Rituxan. The plan is to proceed with cycle 2 as scheduled beginning 09/01/2014.  She has progressive anemia. We will repeat a CBC on 09/10/2014. Red cell transfusion support as needed. Signs and symptoms suggestive of progressive anemia were reviewed at today's visit with Amanda Sellers and her daughter.  The skin rash noted at time of her last visit has resolved. She understands to contact the office if the rash recurs following treatment next week.  We will see her in follow-up with labs on 09/10/2014. She will contact the office in the interim as outlined above or with any other problems.  Plan reviewed with Dr. Benay Spice.    Ned Card ANP/GNP-BC   08/28/2014  12:29 PM

## 2014-08-28 NOTE — Telephone Encounter (Signed)
Pt confirmed labs/ov per 08/05 POF, gave pt avs and calendar.... KJ, sent msg to add chemo

## 2014-08-29 ENCOUNTER — Other Ambulatory Visit: Payer: Self-pay | Admitting: Oncology

## 2014-08-29 ENCOUNTER — Telehealth: Payer: Self-pay | Admitting: *Deleted

## 2014-08-29 NOTE — Telephone Encounter (Signed)
Per staff message and POF I have scheduled appts. Advised scheduler of appts. JMW  

## 2014-09-01 ENCOUNTER — Ambulatory Visit (HOSPITAL_BASED_OUTPATIENT_CLINIC_OR_DEPARTMENT_OTHER): Payer: Medicare HMO

## 2014-09-01 ENCOUNTER — Other Ambulatory Visit (HOSPITAL_BASED_OUTPATIENT_CLINIC_OR_DEPARTMENT_OTHER): Payer: Medicare HMO

## 2014-09-01 VITALS — BP 109/46 | HR 79 | Temp 97.6°F | Resp 18

## 2014-09-01 DIAGNOSIS — Z5112 Encounter for antineoplastic immunotherapy: Secondary | ICD-10-CM

## 2014-09-01 DIAGNOSIS — C8290 Follicular lymphoma, unspecified, unspecified site: Secondary | ICD-10-CM | POA: Diagnosis not present

## 2014-09-01 DIAGNOSIS — Z5111 Encounter for antineoplastic chemotherapy: Secondary | ICD-10-CM

## 2014-09-01 LAB — CBC WITH DIFFERENTIAL/PLATELET
BASO%: 1.3 % (ref 0.0–2.0)
Basophils Absolute: 0.1 10*3/uL (ref 0.0–0.1)
EOS%: 7.3 % — ABNORMAL HIGH (ref 0.0–7.0)
Eosinophils Absolute: 0.3 10*3/uL (ref 0.0–0.5)
HCT: 27.9 % — ABNORMAL LOW (ref 34.8–46.6)
HGB: 9 g/dL — ABNORMAL LOW (ref 11.6–15.9)
LYMPH%: 18.3 % (ref 14.0–49.7)
MCH: 26.1 pg (ref 25.1–34.0)
MCHC: 32.3 g/dL (ref 31.5–36.0)
MCV: 80.9 fL (ref 79.5–101.0)
MONO#: 0.7 10*3/uL (ref 0.1–0.9)
MONO%: 18 % — ABNORMAL HIGH (ref 0.0–14.0)
NEUT#: 2.2 10*3/uL (ref 1.5–6.5)
NEUT%: 55.1 % (ref 38.4–76.8)
Platelets: 107 10*3/uL — ABNORMAL LOW (ref 145–400)
RBC: 3.45 10*6/uL — ABNORMAL LOW (ref 3.70–5.45)
RDW: 23.9 % — ABNORMAL HIGH (ref 11.2–14.5)
WBC: 4 10*3/uL (ref 3.9–10.3)
lymph#: 0.7 10*3/uL — ABNORMAL LOW (ref 0.9–3.3)
nRBC: 0 % (ref 0–0)

## 2014-09-01 MED ORDER — SODIUM CHLORIDE 0.9 % IJ SOLN
10.0000 mL | INTRAMUSCULAR | Status: DC | PRN
  Filled 2014-09-01: qty 10

## 2014-09-01 MED ORDER — DIPHENHYDRAMINE HCL 25 MG PO CAPS
ORAL_CAPSULE | ORAL | Status: AC
  Filled 2014-09-01: qty 1

## 2014-09-01 MED ORDER — SODIUM CHLORIDE 0.9 % IV SOLN
Freq: Once | INTRAVENOUS | Status: AC
  Administered 2014-09-01: 11:00:00 via INTRAVENOUS
  Filled 2014-09-01: qty 4

## 2014-09-01 MED ORDER — DIPHENHYDRAMINE HCL 25 MG PO CAPS
50.0000 mg | ORAL_CAPSULE | Freq: Once | ORAL | Status: AC
  Administered 2014-09-01: 50 mg via ORAL

## 2014-09-01 MED ORDER — SODIUM CHLORIDE 0.9 % IV SOLN
Freq: Once | INTRAVENOUS | Status: AC
  Administered 2014-09-01: 09:00:00 via INTRAVENOUS

## 2014-09-01 MED ORDER — ACETAMINOPHEN 325 MG PO TABS
ORAL_TABLET | ORAL | Status: AC
  Filled 2014-09-01: qty 2

## 2014-09-01 MED ORDER — HEPARIN SOD (PORK) LOCK FLUSH 100 UNIT/ML IV SOLN
500.0000 [IU] | Freq: Once | INTRAVENOUS | Status: DC | PRN
  Filled 2014-09-01: qty 5

## 2014-09-01 MED ORDER — SODIUM CHLORIDE 0.9 % IV SOLN
70.0000 mg/m2 | Freq: Once | INTRAVENOUS | Status: AC
  Administered 2014-09-01: 125 mg via INTRAVENOUS
  Filled 2014-09-01: qty 5

## 2014-09-01 MED ORDER — SODIUM CHLORIDE 0.9 % IV SOLN
375.0000 mg/m2 | Freq: Once | INTRAVENOUS | Status: AC
  Administered 2014-09-01: 700 mg via INTRAVENOUS
  Filled 2014-09-01: qty 70

## 2014-09-01 MED ORDER — ACETAMINOPHEN 325 MG PO TABS
650.0000 mg | ORAL_TABLET | Freq: Once | ORAL | Status: AC
  Administered 2014-09-01: 650 mg via ORAL

## 2014-09-01 NOTE — Patient Instructions (Signed)
[facility] Discharge Instructions for Patients Receiving Chemotherapy  Today you received the following chemotherapy agents: Rituxan and Bendeka  To help prevent nausea and vomiting after your treatment, we encourage you to take your nausea medication as directed.    If you develop nausea and vomiting that is not controlled by your nausea medication, call the clinic.   BELOW ARE SYMPTOMS THAT SHOULD BE REPORTED IMMEDIATELY:  *FEVER GREATER THAN 100.5 F  *CHILLS WITH OR WITHOUT FEVER  NAUSEA AND VOMITING THAT IS NOT CONTROLLED WITH YOUR NAUSEA MEDICATION  *UNUSUAL SHORTNESS OF BREATH  *UNUSUAL BRUISING OR BLEEDING  TENDERNESS IN MOUTH AND THROAT WITH OR WITHOUT PRESENCE OF ULCERS  *URINARY PROBLEMS  *BOWEL PROBLEMS  UNUSUAL RASH Items with * indicate a potential emergency and should be followed up as soon as possible.  Feel free to call the clinic you have any questions or concerns. The clinic phone number is [phone].  Please show the CHEMO ALERT CARD at check-in to the Emergency Department and triage nurse.   

## 2014-09-02 ENCOUNTER — Ambulatory Visit (HOSPITAL_BASED_OUTPATIENT_CLINIC_OR_DEPARTMENT_OTHER): Payer: Medicare HMO

## 2014-09-02 ENCOUNTER — Other Ambulatory Visit: Payer: Self-pay | Admitting: *Deleted

## 2014-09-02 VITALS — BP 153/59 | HR 105 | Temp 97.8°F

## 2014-09-02 DIAGNOSIS — C8290 Follicular lymphoma, unspecified, unspecified site: Secondary | ICD-10-CM | POA: Diagnosis not present

## 2014-09-02 DIAGNOSIS — Z5111 Encounter for antineoplastic chemotherapy: Secondary | ICD-10-CM | POA: Diagnosis not present

## 2014-09-02 MED ORDER — SODIUM CHLORIDE 0.9 % IV SOLN
Freq: Once | INTRAVENOUS | Status: AC
  Administered 2014-09-02: 12:00:00 via INTRAVENOUS
  Filled 2014-09-02: qty 4

## 2014-09-02 MED ORDER — SODIUM CHLORIDE 0.9 % IV SOLN
Freq: Once | INTRAVENOUS | Status: AC
  Administered 2014-09-02: 12:00:00 via INTRAVENOUS

## 2014-09-02 MED ORDER — SODIUM CHLORIDE 0.9 % IV SOLN
70.0000 mg/m2 | Freq: Once | INTRAVENOUS | Status: AC
  Administered 2014-09-02: 125 mg via INTRAVENOUS
  Filled 2014-09-02: qty 5

## 2014-09-02 NOTE — Patient Instructions (Signed)
[facility] Discharge Instructions for Patients Receiving Chemotherapy  Today you received the following chemotherapy agents bendeka  To help prevent nausea and vomiting after your treatment, we encourage you to take your nausea medication as directed   If you develop nausea and vomiting that is not controlled by your nausea medication, call the clinic.   BELOW ARE SYMPTOMS THAT SHOULD BE REPORTED IMMEDIATELY:  *FEVER GREATER THAN 100.5 F  *CHILLS WITH OR WITHOUT FEVER  NAUSEA AND VOMITING THAT IS NOT CONTROLLED WITH YOUR NAUSEA MEDICATION  *UNUSUAL SHORTNESS OF BREATH  *UNUSUAL BRUISING OR BLEEDING  TENDERNESS IN MOUTH AND THROAT WITH OR WITHOUT PRESENCE OF ULCERS  *URINARY PROBLEMS  *BOWEL PROBLEMS  UNUSUAL RASH Items with * indicate a potential emergency and should be followed up as soon as possible.  Feel free to call the clinic you have any questions or concerns. The clinic phone number is [phone].  

## 2014-09-08 ENCOUNTER — Encounter: Payer: Self-pay | Admitting: Family Medicine

## 2014-09-09 ENCOUNTER — Encounter: Payer: Self-pay | Admitting: Family Medicine

## 2014-09-09 ENCOUNTER — Ambulatory Visit (INDEPENDENT_AMBULATORY_CARE_PROVIDER_SITE_OTHER): Payer: Medicare HMO | Admitting: Family Medicine

## 2014-09-09 VITALS — BP 128/69 | HR 83 | Temp 97.3°F | Resp 16 | Ht 64.5 in | Wt 166.0 lb

## 2014-09-09 DIAGNOSIS — E039 Hypothyroidism, unspecified: Secondary | ICD-10-CM

## 2014-09-09 DIAGNOSIS — D509 Iron deficiency anemia, unspecified: Secondary | ICD-10-CM

## 2014-09-09 DIAGNOSIS — R7301 Impaired fasting glucose: Secondary | ICD-10-CM | POA: Diagnosis not present

## 2014-09-09 DIAGNOSIS — I5032 Chronic diastolic (congestive) heart failure: Secondary | ICD-10-CM

## 2014-09-09 LAB — TSH: TSH: 3.897 u[IU]/mL (ref 0.350–4.500)

## 2014-09-09 NOTE — Progress Notes (Signed)
Pre visit review using our clinic review tool, if applicable. No additional management support is needed unless otherwise documented below in the visit note. 

## 2014-09-09 NOTE — Progress Notes (Signed)
OFFICE VISIT  09/09/2014   CC:  Chief Complaint  Patient presents with  . Follow-up    Pt is fasting.     HPI:    Patient is a 81 y.o. Caucasian female who presents for 1 mo f/u chronic diastolic HF, microcytic anemia--currently on iron tabs 2 qd. Had 2nd chemo for B cell lymphoma 1 wk ago, is scheduled for a few weeks from now for 3rd chemo treatment. Taking 2.27m lisinopril now daily at lunch since seeing the cardiologist for initial visit.  Last labs here showed gluc 118, similar to fastings in the past (?), so we plan on doing HbA1c today. She is actually fasting today, but has bmet and cbc f/u orders already in place via oncology so I'll not duplicate these today.    Past Medical History  Diagnosis Date  . Vitamin B 12 deficiency   . HTN (hypertension)   . Vitamin D deficiency   . HLD (hyperlipidemia)      06/2014  . Chronic diastolic congestive heart failure     Echo 07/23/14: mild LVH, EF 50-55%, grade 2 diast dysfxn  . Pulmonary metastases 07/24/2014  . Follicular lymphoma     Non Hodgkins B cell lymphoma;    Cycle 1 bendamustine/rituximab 08/04/2014  . Microcytic anemia     transfused 3 U total in hosp 07/2014  . Positive occult stool blood test 08/08/14    Dr. MWatt Climesto do endoscopies as of 09/07/14  . Hypothyroidism   . Pancytopenia due to antineoplastic chemotherapy     Past Surgical History  Procedure Laterality Date  . Breast biopsy    . Bone marrow biopsy  07/24/14  . Abdominal hysterectomy  1978  . Transthoracic echocardiogram  07/23/14    mild LVH, EF 50-55%, grade 2 diast dysfxn    Outpatient Prescriptions Prior to Visit  Medication Sig Dispense Refill  . ALPRAZolam (XANAX) 0.5 MG tablet Take 1 tablet by mouth every 6 (six) hours as needed. anxiety    . aspirin 81 MG tablet Take 81 mg by mouth daily.    . ergocalciferol (VITAMIN D2) 50000 UNITS capsule Take 50,000 Units by mouth once a week. On Sundays    . ferrous sulfate 325 (65 FE) MG tablet Take 1  tablet (325 mg total) by mouth 2 (two) times daily with a meal. 30 tablet 1  . fish oil-omega-3 fatty acids 1000 MG capsule Take 2 g by mouth daily.    . furosemide (LASIX) 40 MG tablet 1/2 tab po qd 30 tablet 2  . levothyroxine (SYNTHROID, LEVOTHROID) 100 MCG tablet Take 1 tablet (100 mcg total) by mouth daily before breakfast. 30 tablet 0  . lisinopril (PRINIVIL,ZESTRIL) 2.5 MG tablet Take 1 tablet (2.5 mg total) by mouth daily. 90 tablet 3  . OXYGEN Inhale into the lungs. 1Lt at bedtime    . pravastatin (PRAVACHOL) 80 MG tablet Take 80 mg by mouth daily.    .Marland KitchenPRESCRIPTION MEDICATION Chemo    . prochlorperazine (COMPAZINE) 10 MG tablet Take 1 tablet by mouth 3 (three) times daily. Take 1 tab three times a day before meals    . prochlorperazine (COMPAZINE) 5 MG tablet Take 1 tablet (5 mg total) by mouth every 6 (six) hours as needed for nausea or vomiting. 30 tablet 1   No facility-administered medications prior to visit.    No Known Allergies  ROS As per HPI  PE: Blood pressure 128/69, pulse 83, temperature 97.3 F (36.3 C), temperature source Oral,  resp. rate 16, height 5' 4.5" (1.638 m), weight 166 lb (75.297 kg), SpO2 95 %. Gen: Alert, well appearing.  Patient is oriented to person, place, time, and situation. CV: RRR, frequent ectopic beat, w soft syst murmur. LUNGS: CTA bilat nonlabored EXT: 1+ bilat pitting edema, no clubbing or cyanosis  LABS:  Lab Results  Component Value Date   TSH 8.644* 07/22/2014   Lab Results  Component Value Date   WBC 4.0 09/01/2014   HGB 9.0* 09/01/2014   HCT 27.9* 09/01/2014   MCV 80.9 09/01/2014   PLT 107* 09/01/2014   Lab Results  Component Value Date   IRON 23* 07/22/2014   TIBC 291 07/22/2014   FERRITIN 428* 08/18/2014     Chemistry      Component Value Date/Time   NA 140 08/28/2014 1149   NA 140 08/12/2014 1546   K 3.9 08/28/2014 1149   K 4.5 08/12/2014 1546   CL 101 08/12/2014 1546   CO2 29 08/28/2014 1149   CO2 31  08/12/2014 1546   BUN 19.2 08/28/2014 1149   BUN 28* 08/12/2014 1546   CREATININE 0.7 08/28/2014 1149   CREATININE 1.02 08/12/2014 1546      Component Value Date/Time   CALCIUM 8.5 08/28/2014 1149   CALCIUM 9.5 08/12/2014 1546   ALKPHOS 81 08/28/2014 1149   ALKPHOS 92 07/24/2014 0757   AST 21 08/28/2014 1149   AST 16 07/24/2014 0757   ALT 15 08/28/2014 1149   ALT 10* 07/24/2014 0757   BILITOT 0.62 08/28/2014 1149   BILITOT 1.0 07/24/2014 0757      IMPRESSION AND PLAN:  1) IFG: check HbA1c today.  2) Hypothyroidism: taking med correctly now for at least 1 mo, last TSH check 6 wks ago. Recheck TSH today.  3) Chronic diastolic HF: seeing cardiologist now (Dr. Ellyn Hack).  Per pt she is now on 2.8m lisinopril daily and bp tolerating this well. Volume status looks good: LL edema minimal and same as my last exam, wt is down 2 lbs from last visit.  4) Microcytic anemia; likely iron def.  Continue iron supplement.  Her GI MD is getting ready to do endoscopy on her soon.  She will continue ASA qd since her Hb has been stable.  An After Visit Summary was printed and given to the patient.  FOLLOW UP: Return in about 3 months (around 12/10/2014) for routine chronic illness f/u.

## 2014-09-10 ENCOUNTER — Ambulatory Visit (HOSPITAL_BASED_OUTPATIENT_CLINIC_OR_DEPARTMENT_OTHER): Payer: Medicare HMO | Admitting: Oncology

## 2014-09-10 ENCOUNTER — Other Ambulatory Visit (HOSPITAL_BASED_OUTPATIENT_CLINIC_OR_DEPARTMENT_OTHER): Payer: Medicare HMO

## 2014-09-10 ENCOUNTER — Other Ambulatory Visit: Payer: Self-pay | Admitting: *Deleted

## 2014-09-10 ENCOUNTER — Telehealth: Payer: Self-pay | Admitting: Oncology

## 2014-09-10 ENCOUNTER — Encounter: Payer: Self-pay | Admitting: Family Medicine

## 2014-09-10 VITALS — BP 159/61 | HR 18 | Temp 97.1°F | Resp 89 | Ht 64.5 in | Wt 165.8 lb

## 2014-09-10 DIAGNOSIS — R0600 Dyspnea, unspecified: Secondary | ICD-10-CM | POA: Diagnosis not present

## 2014-09-10 DIAGNOSIS — D509 Iron deficiency anemia, unspecified: Secondary | ICD-10-CM | POA: Diagnosis not present

## 2014-09-10 DIAGNOSIS — C8290 Follicular lymphoma, unspecified, unspecified site: Secondary | ICD-10-CM | POA: Diagnosis not present

## 2014-09-10 DIAGNOSIS — R0601 Orthopnea: Secondary | ICD-10-CM

## 2014-09-10 LAB — CBC WITH DIFFERENTIAL/PLATELET
BASO%: 0.8 % (ref 0.0–2.0)
Basophils Absolute: 0 10*3/uL (ref 0.0–0.1)
EOS%: 9.7 % — ABNORMAL HIGH (ref 0.0–7.0)
Eosinophils Absolute: 0.3 10*3/uL (ref 0.0–0.5)
HCT: 29.6 % — ABNORMAL LOW (ref 34.8–46.6)
HGB: 10 g/dL — ABNORMAL LOW (ref 11.6–15.9)
LYMPH%: 3.8 % — ABNORMAL LOW (ref 14.0–49.7)
MCH: 27.7 pg (ref 25.1–34.0)
MCHC: 33.8 g/dL (ref 31.5–36.0)
MCV: 81.9 fL (ref 79.5–101.0)
MONO#: 0.5 10*3/uL (ref 0.1–0.9)
MONO%: 15.3 % — ABNORMAL HIGH (ref 0.0–14.0)
NEUT#: 2.3 10*3/uL (ref 1.5–6.5)
NEUT%: 70.4 % (ref 38.4–76.8)
Platelets: 173 10*3/uL (ref 145–400)
RBC: 3.61 10*6/uL — ABNORMAL LOW (ref 3.70–5.45)
RDW: 27.6 % — ABNORMAL HIGH (ref 11.2–14.5)
WBC: 3.3 10*3/uL — ABNORMAL LOW (ref 3.9–10.3)
lymph#: 0.1 10*3/uL — ABNORMAL LOW (ref 0.9–3.3)

## 2014-09-10 LAB — HEMOGLOBIN A1C
Hgb A1c MFr Bld: 4.7 % (ref <5.7)
Mean Plasma Glucose: 88 mg/dL (ref <117)

## 2014-09-10 LAB — HOLD TUBE, BLOOD BANK

## 2014-09-10 MED ORDER — LEVOTHYROXINE SODIUM 100 MCG PO TABS: 100.0000 ug | ORAL_TABLET | Freq: Every day | ORAL | Status: DC

## 2014-09-10 NOTE — Progress Notes (Signed)
  [facility] OFFICE PROGRESS NOTE   Diagnosis: Non-Hodgkin's lymphoma  INTERVAL HISTORY:   Ms. Encalada returns as scheduled. She completed another cycle of bendamustine/rituximab beginning 09/01/2014. She tolerated the treatment well. She reports malaise for a few days following chemotherapy. The skin rash has resolved. No nausea or mouth sores. No palpable lymph nodes.  Objective:  Vital signs in last 24 hours:  Blood pressure 159/61, pulse 18, temperature 97.1 F (36.2 C), temperature source Oral, resp. rate 89, height 5' 4.5" (1.638 m), weight 165 lb 12.8 oz (75.206 kg), SpO2 100 %.    HEENT: No thrush or ulcers Lymphatics: No cervical, supraclavicular, or axillary nodes Resp: Lungs clear bilaterally Cardio: Regular rate and rhythm GI: No hepatomegaly, no splenomegaly Vascular: No leg edema  Skin: No rash   Portacath/PICC-without erythema  Lab Results:  Lab Results  Component Value Date   WBC 3.3* 09/10/2014   HGB 10.0* 09/10/2014   HCT 29.6* 09/10/2014   MCV 81.9 09/10/2014   PLT 173 09/10/2014   NEUTROABS 2.3 09/10/2014     Medications: I have reviewed the patient's current medications.  Assessment/Plan: 1. Non-Hodgkin's Lymphoma-bone marrow biopsy 07/24/2014 consistent with involvement by follicular B-cell lymphoma, CD20 positive  CTs of the chest 07/22/2014 and CT of the abdomen and pelvis on 07/23/2014-pulmonary nodules, hilar/mediastinal/supraclavicular/axillary adenopathy, splenomegaly, abdominal adenopathy, bilateral adrenal nodules  Cycle 1 bendamustine/rituximab 08/04/2014 2. Severe microcytic anemia, status post transfusion with packed red blood cells 07/23/2014  Normal ferritin, low transferrin saturation, "scant" bone marrow iron stores, review of peripheral blood smear consistent with iron deficiency anemia  She has been referred for outpatient GI evaluation 3. Exertional dyspnea /orthopnea-most likely secondary to congestive heart  failure  4. History of a B-12 deficiency  5. Rash over the trunk and extremities 08/18/2014. Appears consistent with a drug rash. Resolved 08/28/2014.  6. Colonoscopy 06/03/2008. Medium sized internal hemorrhoids.    Disposition:  Ms. Criger appears well. She has completed 2 cycles of bendamustine/rituximab with resolution of palpable lymphadenopathy. She will return for cycle 3 chemotherapy on 09/30/2014.  Ms. Cisney saw Dr. Watt Climes and is scheduled for an endoscopic evaluation next week. She will hold iron and aspirin prior to the procedure.  [name], MD  09/10/2014  10:00 AM

## 2014-09-10 NOTE — Telephone Encounter (Signed)
Gave and printed appt sched/avs for pt

## 2014-09-23 ENCOUNTER — Other Ambulatory Visit: Payer: Self-pay | Admitting: Gastroenterology

## 2014-09-23 HISTORY — PX: UPPER GI ENDOSCOPY: SHX6162

## 2014-09-23 HISTORY — PX: COLONOSCOPY: SHX174

## 2014-09-27 ENCOUNTER — Other Ambulatory Visit: Payer: Self-pay | Admitting: Oncology

## 2014-09-30 ENCOUNTER — Telehealth: Payer: Self-pay | Admitting: Oncology

## 2014-09-30 ENCOUNTER — Ambulatory Visit (HOSPITAL_BASED_OUTPATIENT_CLINIC_OR_DEPARTMENT_OTHER): Payer: Medicare HMO | Admitting: Nurse Practitioner

## 2014-09-30 ENCOUNTER — Other Ambulatory Visit (HOSPITAL_BASED_OUTPATIENT_CLINIC_OR_DEPARTMENT_OTHER): Payer: Medicare HMO

## 2014-09-30 ENCOUNTER — Ambulatory Visit (HOSPITAL_BASED_OUTPATIENT_CLINIC_OR_DEPARTMENT_OTHER): Payer: Medicare HMO

## 2014-09-30 VITALS — BP 117/56 | HR 74 | Temp 96.8°F | Resp 16

## 2014-09-30 VITALS — BP 133/92 | HR 96 | Temp 98.3°F | Resp 18 | Ht 64.5 in | Wt 173.1 lb

## 2014-09-30 DIAGNOSIS — C8290 Follicular lymphoma, unspecified, unspecified site: Secondary | ICD-10-CM

## 2014-09-30 DIAGNOSIS — R0601 Orthopnea: Secondary | ICD-10-CM

## 2014-09-30 DIAGNOSIS — R0600 Dyspnea, unspecified: Secondary | ICD-10-CM | POA: Diagnosis not present

## 2014-09-30 DIAGNOSIS — Z5112 Encounter for antineoplastic immunotherapy: Secondary | ICD-10-CM

## 2014-09-30 DIAGNOSIS — D72819 Decreased white blood cell count, unspecified: Secondary | ICD-10-CM

## 2014-09-30 DIAGNOSIS — D509 Iron deficiency anemia, unspecified: Secondary | ICD-10-CM | POA: Diagnosis not present

## 2014-09-30 DIAGNOSIS — Z5111 Encounter for antineoplastic chemotherapy: Secondary | ICD-10-CM | POA: Diagnosis not present

## 2014-09-30 LAB — CBC WITH DIFFERENTIAL/PLATELET
BASO%: 0.9 % (ref 0.0–2.0)
Basophils Absolute: 0 10*3/uL (ref 0.0–0.1)
EOS%: 4.7 % (ref 0.0–7.0)
Eosinophils Absolute: 0.1 10*3/uL (ref 0.0–0.5)
HCT: 30.3 % — ABNORMAL LOW (ref 34.8–46.6)
HGB: 10.1 g/dL — ABNORMAL LOW (ref 11.6–15.9)
LYMPH%: 7 % — ABNORMAL LOW (ref 14.0–49.7)
MCH: 28.3 pg (ref 25.1–34.0)
MCHC: 33.5 g/dL (ref 31.5–36.0)
MCV: 84.7 fL (ref 79.5–101.0)
MONO#: 0.5 10*3/uL (ref 0.1–0.9)
MONO%: 18.8 % — ABNORMAL HIGH (ref 0.0–14.0)
NEUT#: 2 10*3/uL (ref 1.5–6.5)
NEUT%: 68.6 % (ref 38.4–76.8)
Platelets: 161 10*3/uL (ref 145–400)
RBC: 3.58 10*6/uL — ABNORMAL LOW (ref 3.70–5.45)
RDW: 20.1 % — ABNORMAL HIGH (ref 11.2–14.5)
WBC: 2.9 10*3/uL — ABNORMAL LOW (ref 3.9–10.3)
lymph#: 0.2 10*3/uL — ABNORMAL LOW (ref 0.9–3.3)

## 2014-09-30 LAB — COMPREHENSIVE METABOLIC PANEL (CC13)
ALT: 12 U/L (ref 0–55)
AST: 18 U/L (ref 5–34)
Albumin: 3.4 g/dL — ABNORMAL LOW (ref 3.5–5.0)
Alkaline Phosphatase: 75 U/L (ref 40–150)
Anion Gap: 7 mEq/L (ref 3–11)
BUN: 16.8 mg/dL (ref 7.0–26.0)
CO2: 27 mEq/L (ref 22–29)
Calcium: 8.9 mg/dL (ref 8.4–10.4)
Chloride: 106 mEq/L (ref 98–109)
Creatinine: 0.7 mg/dL (ref 0.6–1.1)
EGFR: 82 mL/min/{1.73_m2} — ABNORMAL LOW (ref >90)
Glucose: 94 mg/dl (ref 70–140)
Potassium: 3.9 mEq/L (ref 3.5–5.1)
Sodium: 141 mEq/L (ref 136–145)
Total Bilirubin: 0.5 mg/dL (ref 0.20–1.20)
Total Protein: 6.1 g/dL — ABNORMAL LOW (ref 6.4–8.3)

## 2014-09-30 MED ORDER — ACETAMINOPHEN 325 MG PO TABS
650.0000 mg | ORAL_TABLET | Freq: Once | ORAL | Status: AC
  Administered 2014-09-30: 650 mg via ORAL

## 2014-09-30 MED ORDER — DIPHENHYDRAMINE HCL 25 MG PO CAPS
ORAL_CAPSULE | ORAL | Status: AC
  Filled 2014-09-30: qty 2

## 2014-09-30 MED ORDER — SODIUM CHLORIDE 0.9 % IV SOLN
Freq: Once | INTRAVENOUS | Status: AC
  Administered 2014-09-30: 14:00:00 via INTRAVENOUS
  Filled 2014-09-30: qty 4

## 2014-09-30 MED ORDER — BENDAMUSTINE HCL CHEMO INJECTION 100 MG/4ML
70.0000 mg/m2 | Freq: Once | INTRAVENOUS | Status: AC
  Administered 2014-09-30: 125 mg via INTRAVENOUS
  Filled 2014-09-30: qty 5

## 2014-09-30 MED ORDER — SODIUM CHLORIDE 0.9 % IV SOLN
375.0000 mg/m2 | Freq: Once | INTRAVENOUS | Status: AC
  Administered 2014-09-30: 700 mg via INTRAVENOUS
  Filled 2014-09-30: qty 70

## 2014-09-30 MED ORDER — SODIUM CHLORIDE 0.9 % IV SOLN
Freq: Once | INTRAVENOUS | Status: AC
  Administered 2014-09-30: 11:00:00 via INTRAVENOUS

## 2014-09-30 MED ORDER — ACETAMINOPHEN 325 MG PO TABS
ORAL_TABLET | ORAL | Status: AC
  Filled 2014-09-30: qty 2

## 2014-09-30 MED ORDER — DIPHENHYDRAMINE HCL 25 MG PO CAPS
50.0000 mg | ORAL_CAPSULE | Freq: Once | ORAL | Status: AC
  Administered 2014-09-30: 50 mg via ORAL

## 2014-09-30 NOTE — Telephone Encounter (Signed)
Gave adn printed appt sched and avs for pt for Sept and OCT °

## 2014-09-30 NOTE — Progress Notes (Signed)
  [facility] OFFICE PROGRESS NOTE   Diagnosis:  Non-Hodgkin's lymphoma  INTERVAL HISTORY:   Amanda Sellers returns as scheduled. She completed cycle 2 bendamustine/Rituxan beginning 09/01/2014. She denies nausea/vomiting. No mouth sores. No diarrhea. No rash. She denies shortness of breath. Leg edema continues to be improved. She denies pain. She has a good appetite. No fevers or sweats. She reports undergoing a colonoscopy and upper endoscopy recently.  Objective:  Vital signs in last 24 hours:  Blood pressure 133/92, pulse 96, temperature 98.3 F (36.8 C), temperature source Oral, resp. rate 18, height 5' 4.5" (1.638 m), weight 173 lb 1.6 oz (78.518 kg), SpO2 98 %.    HEENT: No thrush or ulcers. Lymphatics: No palpable cervical, supra clavicular or axillary lymph nodes. Resp: Lungs clear bilaterally. Cardio: Regular rate and rhythm. GI: Abdomen soft and nontender. No organomegaly. Vascular: 1+ pitting edema at the lower legs bilaterally.  Skin: No rash. Port-A-Cath without erythema.    Lab Results:  Lab Results  Component Value Date   WBC 2.9* 09/30/2014   HGB 10.1* 09/30/2014   HCT 30.3* 09/30/2014   MCV 84.7 09/30/2014   PLT 161 09/30/2014   NEUTROABS 2.0 09/30/2014    Imaging:  No results found.  Medications: I have reviewed the patient's current medications.  Assessment/Plan: 1. 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 Severe microcytic anemia, status post transfusion with packed red blood cells 07/23/2014   Normal  ferritin, low transferrin saturation, "scant" bone marrow iron stores, review of peripheral blood smear consistent with iron deficiency anemia   She has been referred for outpatient GI evaluation  Recent colonoscopy and upper endoscopy 3. Exertional dyspnea /orthopnea-most likely secondary to congestive heart failure  4. History of a B-12 deficiency  5. Rash over the trunk and extremities 08/18/2014. Appears consistent with a drug rash. Resolved 08/28/2014.  6. Colonoscopy 06/03/2008. Medium sized internal hemorrhoids.    Disposition: Amanda Sellers appears stable. She has completed 2 cycles of bendamustine/Rituxan. Plan to proceed with cycle 3 today as scheduled.   The white count is mildly decreased. We will obtain a follow-up CBC in 2 weeks.  We will obtain the recent colonoscopy and upper endoscopy report.  She will return for a follow-up visit and cycle 4 bendamustine/Rituxan in 4 weeks. The plan is to obtain restaging CT scans after she has completed 4 cycles.  Plan reviewed with Dr. Benay Spice.  Ned Card ANP/GNP-BC   09/30/2014  10:24 AM

## 2014-09-30 NOTE — Patient Instructions (Signed)
[facility] Discharge Instructions for Patients Receiving Chemotherapy  Today you received the following chemotherapy agents:  Rituxan and Bendeka  To help prevent nausea and vomiting after your treatment, we encourage you to take your nausea medication as ordered per MD.   If you develop nausea and vomiting that is not controlled by your nausea medication, call the clinic.   BELOW ARE SYMPTOMS THAT SHOULD BE REPORTED IMMEDIATELY:  *FEVER GREATER THAN 100.5 F  *CHILLS WITH OR WITHOUT FEVER  NAUSEA AND VOMITING THAT IS NOT CONTROLLED WITH YOUR NAUSEA MEDICATION  *UNUSUAL SHORTNESS OF BREATH  *UNUSUAL BRUISING OR BLEEDING  TENDERNESS IN MOUTH AND THROAT WITH OR WITHOUT PRESENCE OF ULCERS  *URINARY PROBLEMS  *BOWEL PROBLEMS  UNUSUAL RASH Items with * indicate a potential emergency and should be followed up as soon as possible.  Feel free to call the clinic you have any questions or concerns. The clinic phone number is [phone].  Please show the CHEMO ALERT CARD at check-in to the Emergency Department and triage nurse.   

## 2014-10-01 ENCOUNTER — Ambulatory Visit (HOSPITAL_BASED_OUTPATIENT_CLINIC_OR_DEPARTMENT_OTHER): Payer: Medicare HMO

## 2014-10-01 VITALS — BP 147/75 | HR 97 | Temp 97.8°F | Resp 16

## 2014-10-01 DIAGNOSIS — C8290 Follicular lymphoma, unspecified, unspecified site: Secondary | ICD-10-CM

## 2014-10-01 DIAGNOSIS — Z5111 Encounter for antineoplastic chemotherapy: Secondary | ICD-10-CM | POA: Diagnosis not present

## 2014-10-01 MED ORDER — SODIUM CHLORIDE 0.9 % IV SOLN
Freq: Once | INTRAVENOUS | Status: AC
  Administered 2014-10-01: 14:00:00 via INTRAVENOUS

## 2014-10-01 MED ORDER — SODIUM CHLORIDE 0.9 % IV SOLN
70.0000 mg/m2 | Freq: Once | INTRAVENOUS | Status: AC
  Administered 2014-10-01: 125 mg via INTRAVENOUS
  Filled 2014-10-01: qty 5

## 2014-10-01 MED ORDER — SODIUM CHLORIDE 0.9 % IV SOLN
Freq: Once | INTRAVENOUS | Status: AC
  Administered 2014-10-01: 14:00:00 via INTRAVENOUS
  Filled 2014-10-01: qty 4

## 2014-10-01 NOTE — Patient Instructions (Signed)
[facility] Discharge Instructions for Patients Receiving Chemotherapy  Today you received the following chemotherapy agents: Bendamustine   To help prevent nausea and vomiting after your treatment, we encourage you to take your nausea medication as prescribed.    If you develop nausea and vomiting that is not controlled by your nausea medication, call the clinic.   BELOW ARE SYMPTOMS THAT SHOULD BE REPORTED IMMEDIATELY:  *FEVER GREATER THAN 100.5 F  *CHILLS WITH OR WITHOUT FEVER  NAUSEA AND VOMITING THAT IS NOT CONTROLLED WITH YOUR NAUSEA MEDICATION  *UNUSUAL SHORTNESS OF BREATH  *UNUSUAL BRUISING OR BLEEDING  TENDERNESS IN MOUTH AND THROAT WITH OR WITHOUT PRESENCE OF ULCERS  *URINARY PROBLEMS  *BOWEL PROBLEMS  UNUSUAL RASH Items with * indicate a potential emergency and should be followed up as soon as possible.  Feel free to call the clinic you have any questions or concerns. The clinic phone number is [phone].  Please show the CHEMO ALERT CARD at check-in to the Emergency Department and triage nurse.   

## 2014-10-08 ENCOUNTER — Telehealth: Payer: Self-pay | Admitting: *Deleted

## 2014-10-08 NOTE — Telephone Encounter (Signed)
Return call placed to Hilma Favors Ms Ranker''s daughter, To inform her that Ms.Sayavong came in the office on 09/30/14 I had her sign a records release form for Medical Records and forwarded the release and the copy of dates she needed to medical records. Informed her that a message was left to [facility] in reference to this matter explained to her that I would return her call today about this matter. Ms. Wilder Glade verbalized understanding.

## 2014-10-08 NOTE — Telephone Encounter (Signed)
Patient's daughter Amanda Sellers [phone]) called for Amanda Card NP.  Request status of Mom's Cancer Policy.  They have not received a check and [employer] reports they have not received any information from this office.  [employer] needs labs, treatment information and office notes and appointment information sent routinely and haven't received anything since July 26th."  Amanda Sellers receives a check from this policy which helps pay her medical bills.  [name] wouldl ike a return call.  "[name] will know.  Mom says she black lady come with papers mom signed and witnessed."

## 2014-10-08 NOTE — Telephone Encounter (Signed)
Call placed to Amanda Sellers in medical records in reference to Amanda Sellers's papers. she stated she would send the papers over to the person who handles that information. Amanda Sellers Daughter informed call was placed and papers are being processed. Amanda Sellers daughter verbalized understanding.

## 2014-10-13 ENCOUNTER — Encounter: Payer: Self-pay | Admitting: *Deleted

## 2014-10-14 ENCOUNTER — Other Ambulatory Visit (HOSPITAL_BASED_OUTPATIENT_CLINIC_OR_DEPARTMENT_OTHER): Payer: Medicare HMO

## 2014-10-14 ENCOUNTER — Encounter: Payer: Self-pay | Admitting: Family Medicine

## 2014-10-14 DIAGNOSIS — C8290 Follicular lymphoma, unspecified, unspecified site: Secondary | ICD-10-CM | POA: Diagnosis not present

## 2014-10-14 LAB — CBC WITH DIFFERENTIAL/PLATELET
BASO%: 1.7 % (ref 0.0–2.0)
Basophils Absolute: 0.1 10*3/uL (ref 0.0–0.1)
EOS%: 9 % — ABNORMAL HIGH (ref 0.0–7.0)
Eosinophils Absolute: 0.3 10*3/uL (ref 0.0–0.5)
HCT: 27 % — ABNORMAL LOW (ref 34.8–46.6)
HGB: 8.8 g/dL — ABNORMAL LOW (ref 11.6–15.9)
LYMPH%: 11 % — ABNORMAL LOW (ref 14.0–49.7)
MCH: 27.4 pg (ref 25.1–34.0)
MCHC: 32.6 g/dL (ref 31.5–36.0)
MCV: 84.1 fL (ref 79.5–101.0)
MONO#: 0.4 10*3/uL (ref 0.1–0.9)
MONO%: 14.3 % — ABNORMAL HIGH (ref 0.0–14.0)
NEUT#: 1.9 10*3/uL (ref 1.5–6.5)
NEUT%: 64 % (ref 38.4–76.8)
Platelets: 217 10*3/uL (ref 145–400)
RBC: 3.21 10*6/uL — ABNORMAL LOW (ref 3.70–5.45)
RDW: 17.2 % — ABNORMAL HIGH (ref 11.2–14.5)
WBC: 3 10*3/uL — ABNORMAL LOW (ref 3.9–10.3)
lymph#: 0.3 10*3/uL — ABNORMAL LOW (ref 0.9–3.3)
nRBC: 0 % (ref 0–0)

## 2014-10-15 ENCOUNTER — Encounter (HOSPITAL_COMMUNITY): Payer: Self-pay | Admitting: General Practice

## 2014-10-15 ENCOUNTER — Telehealth: Payer: Self-pay | Admitting: Cardiology

## 2014-10-15 ENCOUNTER — Observation Stay (HOSPITAL_COMMUNITY)
Admission: AD | Admit: 2014-10-15 | Discharge: 2014-10-16 | Disposition: A | Discharge status: Home / Self Care | Payer: Medicare HMO | Source: Ambulatory Visit | Attending: Cardiology | Admitting: Cardiology

## 2014-10-15 ENCOUNTER — Ambulatory Visit (INDEPENDENT_AMBULATORY_CARE_PROVIDER_SITE_OTHER): Payer: Medicare HMO | Admitting: Cardiology

## 2014-10-15 ENCOUNTER — Encounter: Payer: Self-pay | Admitting: Cardiology

## 2014-10-15 VITALS — BP 118/64 | HR 130 | Ht 64.5 in | Wt 172.9 lb

## 2014-10-15 DIAGNOSIS — D509 Iron deficiency anemia, unspecified: Secondary | ICD-10-CM | POA: Diagnosis not present

## 2014-10-15 DIAGNOSIS — D649 Anemia, unspecified: Secondary | ICD-10-CM

## 2014-10-15 DIAGNOSIS — E559 Vitamin D deficiency, unspecified: Secondary | ICD-10-CM | POA: Insufficient documentation

## 2014-10-15 DIAGNOSIS — I5033 Acute on chronic diastolic (congestive) heart failure: Secondary | ICD-10-CM

## 2014-10-15 DIAGNOSIS — I10 Essential (primary) hypertension: Secondary | ICD-10-CM | POA: Diagnosis not present

## 2014-10-15 DIAGNOSIS — C7800 Secondary malignant neoplasm of unspecified lung: Secondary | ICD-10-CM | POA: Insufficient documentation

## 2014-10-15 DIAGNOSIS — Z79899 Other long term (current) drug therapy: Secondary | ICD-10-CM | POA: Diagnosis not present

## 2014-10-15 DIAGNOSIS — C8290 Follicular lymphoma, unspecified, unspecified site: Secondary | ICD-10-CM | POA: Diagnosis not present

## 2014-10-15 DIAGNOSIS — Z7901 Long term (current) use of anticoagulants: Secondary | ICD-10-CM | POA: Diagnosis not present

## 2014-10-15 DIAGNOSIS — E785 Hyperlipidemia, unspecified: Secondary | ICD-10-CM | POA: Insufficient documentation

## 2014-10-15 DIAGNOSIS — I4892 Unspecified atrial flutter: Secondary | ICD-10-CM | POA: Diagnosis not present

## 2014-10-15 DIAGNOSIS — D61810 Antineoplastic chemotherapy induced pancytopenia: Secondary | ICD-10-CM | POA: Diagnosis not present

## 2014-10-15 DIAGNOSIS — I5031 Acute diastolic (congestive) heart failure: Secondary | ICD-10-CM | POA: Diagnosis not present

## 2014-10-15 DIAGNOSIS — I5032 Chronic diastolic (congestive) heart failure: Secondary | ICD-10-CM | POA: Diagnosis present

## 2014-10-15 DIAGNOSIS — E039 Hypothyroidism, unspecified: Secondary | ICD-10-CM | POA: Insufficient documentation

## 2014-10-15 DIAGNOSIS — I5042 Chronic combined systolic (congestive) and diastolic (congestive) heart failure: Secondary | ICD-10-CM | POA: Diagnosis present

## 2014-10-15 DIAGNOSIS — Z7982 Long term (current) use of aspirin: Secondary | ICD-10-CM | POA: Diagnosis not present

## 2014-10-15 DIAGNOSIS — I4891 Unspecified atrial fibrillation: Secondary | ICD-10-CM | POA: Insufficient documentation

## 2014-10-15 DIAGNOSIS — I480 Paroxysmal atrial fibrillation: Secondary | ICD-10-CM | POA: Insufficient documentation

## 2014-10-15 HISTORY — DX: Long term (current) use of anticoagulants: Z79.01

## 2014-10-15 HISTORY — DX: Anxiety disorder, unspecified: F41.9

## 2014-10-15 HISTORY — DX: Reserved for inherently not codable concepts without codable children: IMO0001

## 2014-10-15 LAB — COMPREHENSIVE METABOLIC PANEL
ALT: 16 U/L (ref 14–54)
AST: 27 U/L (ref 15–41)
Albumin: 3.1 g/dL — ABNORMAL LOW (ref 3.5–5.0)
Alkaline Phosphatase: 67 U/L (ref 38–126)
Anion gap: 5 (ref 5–15)
BUN: 13 mg/dL (ref 6–20)
CO2: 30 mmol/L (ref 22–32)
Calcium: 9 mg/dL (ref 8.9–10.3)
Chloride: 105 mmol/L (ref 101–111)
Creatinine, Ser: 0.65 mg/dL (ref 0.44–1.00)
GFR calc Af Amer: >60 mL/min (ref >60)
GFR calc non Af Amer: >60 mL/min (ref >60)
Glucose, Bld: 130 mg/dL — ABNORMAL HIGH (ref 65–99)
Potassium: 3.5 mmol/L (ref 3.5–5.1)
Sodium: 140 mmol/L (ref 135–145)
Total Bilirubin: 0.3 mg/dL (ref 0.3–1.2)
Total Protein: 5.7 g/dL — ABNORMAL LOW (ref 6.5–8.1)

## 2014-10-15 LAB — TSH: TSH: 3.203 u[IU]/mL (ref 0.350–4.500)

## 2014-10-15 LAB — MAGNESIUM: Magnesium: 2 mg/dL (ref 1.7–2.4)

## 2014-10-15 MED ORDER — LISINOPRIL 2.5 MG PO TABS
2.5000 mg | ORAL_TABLET | Freq: Every day | ORAL | Status: DC
  Administered 2014-10-16: 2.5 mg via ORAL
  Filled 2014-10-15: qty 1

## 2014-10-15 MED ORDER — APIXABAN 5 MG PO TABS
5.0000 mg | ORAL_TABLET | Freq: Two times a day (BID) | ORAL | Status: DC
  Administered 2014-10-15 – 2014-10-16 (×2): 5 mg via ORAL
  Filled 2014-10-15 (×2): qty 1

## 2014-10-15 MED ORDER — FERROUS SULFATE 325 (65 FE) MG PO TABS
325.0000 mg | ORAL_TABLET | Freq: Two times a day (BID) | ORAL | Status: DC
  Administered 2014-10-15: 325 mg via ORAL
  Filled 2014-10-15: qty 1

## 2014-10-15 MED ORDER — SODIUM CHLORIDE 0.9 % IJ SOLN
3.0000 mL | Freq: Two times a day (BID) | INTRAMUSCULAR | Status: DC
  Administered 2014-10-15 – 2014-10-16 (×2): 3 mL via INTRAVENOUS

## 2014-10-15 MED ORDER — LEVOTHYROXINE SODIUM 100 MCG PO TABS
100.0000 ug | ORAL_TABLET | Freq: Every day | ORAL | Status: DC
  Administered 2014-10-16: 100 ug via ORAL
  Filled 2014-10-15: qty 1

## 2014-10-15 MED ORDER — METOPROLOL TARTRATE 25 MG PO TABS
25.0000 mg | ORAL_TABLET | Freq: Two times a day (BID) | ORAL | Status: AC
  Administered 2014-10-15: 25 mg via ORAL

## 2014-10-15 MED ORDER — ONDANSETRON HCL 4 MG/2ML IJ SOLN: 4.0000 mg | Freq: Four times a day (QID) | INTRAMUSCULAR | Status: DC | PRN

## 2014-10-15 MED ORDER — PROCHLORPERAZINE MALEATE 5 MG PO TABS
5.0000 mg | ORAL_TABLET | Freq: Four times a day (QID) | ORAL | Status: DC | PRN
  Filled 2014-10-15: qty 1

## 2014-10-15 MED ORDER — OMEGA-3 FATTY ACIDS 1000 MG PO CAPS: 2.0000 g | ORAL_CAPSULE | Freq: Every day | ORAL | Status: DC

## 2014-10-15 MED ORDER — APIXABAN 5 MG PO TABS
5.0000 mg | ORAL_TABLET | Freq: Two times a day (BID) | ORAL | Status: AC
  Administered 2014-10-15: 5 mg via ORAL

## 2014-10-15 MED ORDER — PRAVASTATIN SODIUM 40 MG PO TABS
80.0000 mg | ORAL_TABLET | Freq: Every day | ORAL | Status: DC
  Administered 2014-10-16: 80 mg via ORAL
  Filled 2014-10-15: qty 2

## 2014-10-15 MED ORDER — SODIUM CHLORIDE 0.9 % IV SOLN: 250.0000 mL | INTRAVENOUS | Status: DC

## 2014-10-15 MED ORDER — FUROSEMIDE 10 MG/ML IJ SOLN
20.0000 mg | Freq: Two times a day (BID) | INTRAMUSCULAR | Status: DC
  Administered 2014-10-15 – 2014-10-16 (×2): 20 mg via INTRAVENOUS
  Filled 2014-10-15 (×2): qty 2

## 2014-10-15 MED ORDER — OMEGA-3-ACID ETHYL ESTERS 1 G PO CAPS
2.0000 g | ORAL_CAPSULE | Freq: Every day | ORAL | Status: DC
Start: 2014-10-16 — End: 2014-10-16
  Administered 2014-10-16: 2 g via ORAL
  Filled 2014-10-15: qty 2

## 2014-10-15 MED ORDER — ACETAMINOPHEN 325 MG PO TABS: 650.0000 mg | ORAL_TABLET | ORAL | Status: DC | PRN

## 2014-10-15 MED ORDER — SODIUM CHLORIDE 0.9 % IV SOLN: INTRAVENOUS | Status: DC

## 2014-10-15 MED ORDER — SODIUM CHLORIDE 0.9 % IJ SOLN: 3.0000 mL | INTRAMUSCULAR | Status: DC | PRN

## 2014-10-15 MED ORDER — VITAMIN D (ERGOCALCIFEROL) 1.25 MG (50000 UNIT) PO CAPS
50000.0000 [IU] | ORAL_CAPSULE | ORAL | Status: DC
  Administered 2014-10-16: 50000 [IU] via ORAL
  Filled 2014-10-15: qty 1

## 2014-10-15 NOTE — Progress Notes (Signed)
PATIENT: Amanda Sellers MRN: [id number] DOB: 17-Feb-1932 PCP: Tammi Sou, MD  Clinic Note: Chief Complaint  Patient presents with  . Shortness of Breath    Swelling in feet, not sleeping well  Extensive records from 2 recent hospitalizations and PCP clinic note were reviewed.  HPI: Amanda Sellers is a 81 y.o. female with a PMH below who presents today for 2 month f/u visit after establishing cardiology care for diastolic heart failure. She is also getting Oncology Rx for Lymphoma.  Echo 6/29/'16:  - Left ventricle: The cavity size was normal.  mild LVH.   EF 50% to 55%.  No regional wall motion abnormalities.    grade 2 diastolic dysfunction - Aortic valve: There was trivial regurgitation. - Mitral valve: Calcified annulus. - Left atrium: The atrium was moderately dilated. - Pulmonary arteries: PA peak pressure: 31 mm Hg (S).  I first saw her on July 25th - I backed off on her ACE-I & lasix dose b/c she was quite listless.    Interval History:  She did relatively well until the past few weeks when began noting first exertional dyspnea, followed by PND & orhtopnea.  Over the past ~2-3 days, she has now noted worsening edema.  She feels very tired & has not energy.  She has noted a few palpitations, but does not recall rapid irregular heart rates/rhythms.   She has not had any chest tightness or pressure with rest, but does note a bit of chest tightness when she has been trying to walk in a hurry over the past few days. She has noted some dizziness with exertion.   The remainder of cardiac review of systems is as follows: Cardiovascular ROS: positive for - chest pain, dyspnea on exertion, edema, orthopnea, paroxysmal nocturnal dyspnea and shortness of breath negative for - loss of consciousness, rapid heart rate or  syncope/near-syncope TIA/amaurosis fugax    Past Medical History  Diagnosis Date  . Vitamin B 12 deficiency   . HTN (hypertension)   . Vitamin D  deficiency   . HLD (hyperlipidemia)      06/2014  . Chronic diastolic congestive heart failure     Echo 07/23/14: mild LVH, EF 50-55%, grade 2 diast dysfxn  . Pulmonary metastases 07/24/2014  . Follicular lymphoma     Non Hodgkins B cell lymphoma;    Cycle 1 bendamustine/rituximab 08/04/2014  . Microcytic anemia     transfused 3 U total in hosp 07/2014  . Positive occult stool blood test 08/08/14    Dr. Watt Climes to do endoscopies as of 09/07/14  . Hypothyroidism   . Pancytopenia due to antineoplastic chemotherapy   . Hyperglycemia     on several BMETs but fasting status was unknown, so A1c was done and it was WELL wnl.    Prior Cardiac Evaluation and Past Surgical History: Past Surgical History  Procedure Laterality Date  . Breast biopsy    . Bone marrow biopsy  07/24/14  . Abdominal hysterectomy  1978  . Transthoracic echocardiogram  07/23/14    mild LVH, EF 50-55%, grade 2 diast dysfxn  . Upper gi endoscopy  09/23/2014    small hiatus hernia, nodules in stomach biopsied (chronic active erosive atrophic gastritis with intestinal metaplasia--no dysplasia or malignancy) otherwise normal  . Colonoscopy  09/23/14    small hemorrhoids, otherwise normal (performed for IDA and heme+ stool)    No Known Allergies  Current Outpatient Prescriptions  Medication Sig Dispense Refill  . ALPRAZolam (XANAX) 0.5 MG  tablet Take 1 tablet by mouth every 6 (six) hours as needed. anxiety    . aspirin 81 MG tablet Take 81 mg by mouth daily.    . ergocalciferol (VITAMIN D2) 50000 UNITS capsule Take 50,000 Units by mouth once a week. On Sundays    . ferrous sulfate 325 (65 FE) MG tablet Take 1 tablet (325 mg total) by mouth 2 (two) times daily with a meal. 30 tablet 1  . fish oil-omega-3 fatty acids 1000 MG capsule Take 2 g by mouth daily.    . furosemide (LASIX) 40 MG tablet 1/2 tab po qd 30 tablet 2  . levothyroxine (SYNTHROID, LEVOTHROID) 100 MCG tablet Take 1 tablet (100 mcg total) by mouth daily before  breakfast. 30 tablet 3  . lisinopril (PRINIVIL,ZESTRIL) 2.5 MG tablet Take 1 tablet (2.5 mg total) by mouth daily. 90 tablet 3  . OXYGEN Inhale into the lungs. 1Lt at bedtime    . pravastatin (PRAVACHOL) 80 MG tablet Take 80 mg by mouth daily.    Marland Kitchen PRESCRIPTION MEDICATION Chemo    . prochlorperazine (COMPAZINE) 10 MG tablet Take 1 tablet by mouth 3 (three) times daily before meals.     . prochlorperazine (COMPAZINE) 5 MG tablet Take 1 tablet (5 mg total) by mouth every 6 (six) hours as needed for nausea or vomiting. 30 tablet 1   No current facility-administered medications for this visit.    Social History   Social History Narrative   Widowed, has 8 children.   Orig from [place], now lives in [place].   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   No tob/alc/drugs.    family history includes Diabetes in her daughter; Lymphoma in her sister; Melanoma in her mother; Stomach cancer in her father.  ROS: A comprehensive Review of Systems -  Review of Systems  Constitutional: Positive for malaise/fatigue (feels tired after chemo). Negative for fever and chills.  HENT: Negative for nosebleeds.   Respiratory: Positive for shortness of breath.   Cardiovascular: Negative for claudication.  Gastrointestinal: Positive for nausea (associated with Chemo). Negative for vomiting, abdominal pain, blood in stool and melena (Does have dark stool - plans to see GI MD).  Genitourinary: Negative for hematuria.  Musculoskeletal: Positive for joint pain. Negative for falls.  Skin: Positive for rash.  Neurological: Positive for dizziness (with low BP).       Unsteady gait - poor balance, mild vertigo  Endo/Heme/Allergies: Does not bruise/bleed easily.  Psychiatric/Behavioral: The patient is nervous/anxious (anxious about her symptoms.).   All other systems reviewed and are negative.  Wt Readings from Last 3 Encounters:  10/15/14 172 lb 14.4 oz (78.427 kg)    09/30/14 173 lb 1.6 oz (78.518 kg)  09/10/14 165 lb 12.8 oz (75.206 kg)     PHYSICAL EXAM BP 118/64 mmHg  Pulse 130  Ht 5' 4.5" (1.638 m)  Wt 172 lb 14.4 oz (78.427 kg)  BMI 29.23 kg/m2 General appearance: alert, cooperative, appears stated age, milddistress and concerned.  Neck: no adenopathy, no carotid bruit and no JVD Lungs: normal percussion bilaterally and Mild, diffuse interstitial sounds bilaterally. Nonlabored. Good air movement. Heart: Irreg-Irreg rapid rate with ectopy as well.  Normal S1 & S2. Nondisplaced PMI. Salt 1-2/6 SEM. No R./G. Abdomen: soft, non-tender; bowel sounds normal; no masses,  no organomegaly Extremities: 2-+ edema bilaterally distal leg to ankles Pulses: 2+ and symmetric Skin: Skin color, texture, turgor normal. No lesions.  Neurologic/Psych : Alert  and oriented X 3, normal strength and tone. Normal symmetric reflexes. Normal coordination and gait. CN II-XII grossly intact.  Pleasant mood and affect..   Adult ECG Report -  Afib RVR (130 bpm) with PVCs or aberrantly conducted complexes; ST-T wave abnormality, consider ischemia  Recent Labs:  Lab Results  Component Value Date   CREATININE 0.7 09/30/2014   Lab Results  Component Value Date   K 3.9 09/30/2014    ASSESSMENT / PLAN: Very pleasant elderly woman with an recent diagnosis of diastolic heart failure secondary to hypertension who now presents with acute exacerbation of Sx 2/2 new Dx of Afib with RVR.  She has significant co-morbidities & is too symptomatic to manage as an outpatient.  Unfortunately, we cannot be sure of the timing of Afib onset.  I plan to admit her to [facility] -- will initially attempt rate control with IV Diltiazem & start low dose IV Furosemide -- we will give her Metoprolol 25 mg PO x 1, Lasix 8m PO x 1 & Eliquis 525m If she remains in Afib tomorrow, would plan for TEE/DCCV - have started her on Eliquis (1st dose in the office)/ I do think that restoration of  NSR is necessary with her diastolic dysfunction /HF.  We will need to assess her Anemia risk as far as long-term anticoagulation is concerned, but 1 month post DCCV should be ok for a start.  Will probably need to hold ACE-I to allow BP room for rate control agents - but would hope to restart if BP tolerates.   Problem List Items Addressed This Visit    Acute on chronic diastolic CHF (congestive heart failure), NYHA class 3 - Primary    I suspect that she has been in Afib for > 3 days given progression of HF Sx from SOE to PND/Orthopnea & now edema.   Will need more aggressive diuretic while monitored as an inpatient -- IV Lasix 20 mg bid to start (relatively naiive).        Relevant Medications   metoprolol tartrate (LOPRESSOR) tablet 25 mg (Completed)   apixaban (ELIQUIS) tablet 5 mg (Completed)   Other Relevant Orders   EKG 12-Lead   Atrial fibrillation with RVR    Given Lopressor 25 mg PO x 1 along with Eliquis. Upon admission, would start IV CCB gtt & plan TEE/DCCV in AM. It would be interesting to see how she feels with rate control, but in setting of Acute HFPEF (DHF), would prefer restoration of NSR.      Relevant Medications   metoprolol tartrate (LOPRESSOR) tablet 25 mg (Completed)   apixaban (ELIQUIS) tablet 5 mg (Completed)   Other Relevant Orders   EKG 12-Lead   Chronic diastolic CHF (congestive heart failure), NYHA class 2 (Chronic)   Relevant Medications   metoprolol tartrate (LOPRESSOR) tablet 25 mg (Completed)   apixaban (ELIQUIS) tablet 5 mg (Completed)   Essential hypertension (Chronic)    BP currently stable on low dose ACE-I, but will need to monitor with IV rate control Rx (Diltiazem). Will need to assess BP w/ & w/out ACE-I once rate control added.      Relevant Medications   metoprolol tartrate (LOPRESSOR) tablet 25 mg (Completed)   apixaban (ELIQUIS) tablet 5 mg (Completed)   New onset a-fib    New Dx - clearly intolerant of RVR - for now would prefer  to attempt to re-establish NSR. Would probably opt for BB/CCB Rx for rate control initially, but if Afib recurs or  symptomatic in Afib despite rate control, would have a low threshold to use rhythm control.  Initiate DOAC - & plan TEE/DCCV tomorrow as inpatient.      Relevant Medications   metoprolol tartrate (LOPRESSOR) tablet 25 mg (Completed)   apixaban (ELIQUIS) tablet 5 mg (Completed)   Other Relevant Orders   EKG 12-Lead   Symptomatic anemia      Meds ordered this encounter  Medications  . metoprolol tartrate (LOPRESSOR) tablet 25 mg    Sig:   . apixaban (ELIQUIS) tablet 5 mg    Sig:      Post-discharge, she should initially f/u in the Afib clinic to adjust appropriate medications for rate/rhythm control. Will also need OP Myoview to exclude an ischemic etiology.   Keron Neenan W. Ellyn Hack, M.D., M.S. Interventional Cardiolgy [facility]

## 2014-10-15 NOTE — Telephone Encounter (Signed)
Need admission orders for for patient .Marland KitchenThanks

## 2014-10-15 NOTE — Assessment & Plan Note (Signed)
Given Lopressor 25 mg PO x 1 along with Eliquis. Upon admission, would start IV CCB gtt & plan TEE/DCCV in AM. It would be interesting to see how she feels with rate control, but in setting of Acute HFPEF (DHF), would prefer restoration of NSR.

## 2014-10-15 NOTE — H&P (Signed)
PATIENTJasman Sellers JZPHXTAVWP: [id number] DOB: March 19, 1932 PCP: Tammi Sou, MD  Clinic Note: Chief Complaint  Patient presents with  . Shortness of Breath    Swelling in feet, not sleeping well  Extensive records from 2 recent hospitalizations and PCP clinic note were reviewed.  HPI: Amanda Sellers is a 81 y.o. female with a PMH below who presents today for 2 month f/u visit after establishing cardiology care for diastolic heart failure. She is also getting Oncology Rx for Lymphoma.  Echo 6/29/'16:  - Left ventricle: The cavity size was normal. mild LVH.   EF 50% to 55%.  No regional wall motion abnormalities.   grade 2 diastolic dysfunction - Aortic valve: There was trivial regurgitation. - Mitral valve: Calcified annulus. - Left atrium: The atrium was moderately dilated. - Pulmonary arteries: PA peak pressure: 31 mm Hg (S).  I first saw her on July 25th - I backed off on her ACE-I & lasix dose b/c she was quite listless.    Interval History:  She did relatively well until the past few weeks when began noting first exertional dyspnea, followed by PND & orhtopnea. Over the past ~2-3 days, she has now noted worsening edema. She feels very tired & has not energy. She has noted a few palpitations, but does not recall rapid irregular heart rates/rhythms.  She has not had any chest tightness or pressure with rest, but does note a bit of chest tightness when she has been trying to walk in a hurry over the past few days. She has noted some dizziness with exertion.   The remainder of cardiac review of systems is as follows: Cardiovascular ROS: positive for - chest pain, dyspnea on exertion, edema, orthopnea, paroxysmal nocturnal dyspnea and shortness of breath negative for - loss of consciousness, rapid heart rate or  syncope/near-syncope TIA/amaurosis fugax    Past Medical History  Diagnosis Date  . Vitamin B 12 deficiency   . HTN  (hypertension)   . Vitamin D deficiency   . HLD (hyperlipidemia)     06/2014  . Chronic diastolic congestive heart failure     Echo 07/23/14: mild LVH, EF 50-55%, grade 2 diast dysfxn  . Pulmonary metastases 07/24/2014  . Follicular lymphoma     Non Hodgkins B cell lymphoma;  Cycle 1 bendamustine/rituximab 08/04/2014  . Microcytic anemia     transfused 3 U total in hosp 07/2014  . Positive occult stool blood test 08/08/14    Dr. Watt Climes to do endoscopies as of 09/07/14  . Hypothyroidism   . Pancytopenia due to antineoplastic chemotherapy   . Hyperglycemia     on several BMETs but fasting status was unknown, so A1c was done and it was WELL wnl.    Prior Cardiac Evaluation and Past Surgical History: Past Surgical History  Procedure Laterality Date  . Breast biopsy    . Bone marrow biopsy  07/24/14  . Abdominal hysterectomy  1978  . Transthoracic echocardiogram  07/23/14    mild LVH, EF 50-55%, grade 2 diast dysfxn  . Upper gi endoscopy  09/23/2014    small hiatus hernia, nodules in stomach biopsied (chronic active erosive atrophic gastritis with intestinal metaplasia--no dysplasia or malignancy) otherwise normal  . Colonoscopy  09/23/14    small hemorrhoids, otherwise normal (performed for IDA and heme+ stool)    No Known Allergies  Current Outpatient Prescriptions  Medication Sig Dispense Refill  . ALPRAZolam (XANAX) 0.5 MG tablet Take 1 tablet by mouth every 6 (six) hours as  needed. anxiety    . aspirin 81 MG tablet Take 81 mg by mouth daily.    . ergocalciferol (VITAMIN D2) 50000 UNITS capsule Take 50,000 Units by mouth once a week. On Sundays    . ferrous sulfate 325 (65 FE) MG tablet Take 1 tablet (325 mg total) by mouth 2 (two) times daily with a meal. 30 tablet 1  . fish oil-omega-3 fatty acids 1000 MG capsule Take 2 g by mouth daily.    . furosemide (LASIX) 40  MG tablet 1/2 tab po qd 30 tablet 2  . levothyroxine (SYNTHROID, LEVOTHROID) 100 MCG tablet Take 1 tablet (100 mcg total) by mouth daily before breakfast. 30 tablet 3  . lisinopril (PRINIVIL,ZESTRIL) 2.5 MG tablet Take 1 tablet (2.5 mg total) by mouth daily. 90 tablet 3  . OXYGEN Inhale into the lungs. 1Lt at bedtime    . pravastatin (PRAVACHOL) 80 MG tablet Take 80 mg by mouth daily.    Marland Kitchen PRESCRIPTION MEDICATION Chemo    . prochlorperazine (COMPAZINE) 10 MG tablet Take 1 tablet by mouth 3 (three) times daily before meals.     . prochlorperazine (COMPAZINE) 5 MG tablet Take 1 tablet (5 mg total) by mouth every 6 (six) hours as needed for nausea or vomiting. 30 tablet 1   No current facility-administered medications for this visit.    Social History   Social History Narrative   Widowed, has 8 children.   Orig from [place], now lives in [place].   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   No tob/alc/drugs.    family history includes Diabetes in her daughter; Lymphoma in her sister; Melanoma in her mother; Stomach cancer in her father.  ROS: A comprehensive Review of Systems -  Review of Systems  Constitutional: Positive for malaise/fatigue (feels tired after chemo). Negative for fever and chills.  HENT: Negative for nosebleeds.  Respiratory: Positive for shortness of breath.  Cardiovascular: Negative for claudication.  Gastrointestinal: Positive for nausea (associated with Chemo). Negative for vomiting, abdominal pain, blood in stool and melena (Does have dark stool - plans to see GI MD).  Genitourinary: Negative for hematuria.  Musculoskeletal: Positive for joint pain. Negative for falls.  Skin: Positive for rash.  Neurological: Positive for dizziness (with low BP).   Unsteady gait - poor balance, mild vertigo  Endo/Heme/Allergies: Does not bruise/bleed easily.    Psychiatric/Behavioral: The patient is nervous/anxious (anxious about her symptoms.).  All other systems reviewed and are negative.  Wt Readings from Last 3 Encounters:  10/15/14 172 lb 14.4 oz (78.427 kg)  09/30/14 173 lb 1.6 oz (78.518 kg)  09/10/14 165 lb 12.8 oz (75.206 kg)     PHYSICAL EXAM BP 118/64 mmHg  Pulse 130  Ht 5' 4.5" (1.638 m)  Wt 172 lb 14.4 oz (78.427 kg)  BMI 29.23 kg/m2 General appearance: alert, cooperative, appears stated age, milddistress and concerned.  Neck: no adenopathy, no carotid bruit and no JVD Lungs: normal percussion bilaterally and Mild, diffuse interstitial sounds bilaterally. Nonlabored. Good air movement. Heart: Irreg-Irreg rapid rate with ectopy as well. Normal S1 & S2. Nondisplaced PMI. Salt 1-2/6 SEM. No R./G. Abdomen: soft, non-tender; bowel sounds normal; no masses, no organomegaly Extremities: 2-+ edema bilaterally distal leg to ankles Pulses: 2+ and symmetric Skin: Skin color, texture, turgor normal. No lesions.  Neurologic/Psych : Alert and oriented X 3, normal strength and tone. Normal symmetric reflexes. Normal coordination and gait. CN II-XII grossly intact. Pleasant  mood and affect..  Adult ECG Report -  Afib RVR (130 bpm) with PVCs or aberrantly conducted complexes; ST-T wave abnormality, consider ischemia  Recent Labs:   Recent Labs    Lab Results  Component Value Date   CREATININE 0.7 09/30/2014      Recent Labs    Lab Results  Component Value Date   K 3.9 09/30/2014      ASSESSMENT / PLAN: Very pleasant elderly woman with an recent diagnosis of diastolic heart failure secondary to hypertension who now presents with acute exacerbation of Sx 2/2 new Dx of Afib with RVR. She has significant co-morbidities & is too symptomatic to manage as an outpatient. Unfortunately, we cannot be sure of the timing of Afib onset.  I plan to admit her to [facility] -- will initially attempt rate  control with IV Diltiazem & start low dose IV Furosemide -- we will give her Metoprolol 25 mg PO x 1, Lasix 34m PO x 1 & Eliquis 556m If she remains in Afib tomorrow, would plan for TEE/DCCV - have started her on Eliquis (1st dose in the office)/ I do think that restoration of NSR is necessary with her diastolic dysfunction /HF. We will need to assess her Anemia risk as far as long-term anticoagulation is concerned, but 1 month post DCCV should be ok for a start.  Will probably need to hold ACE-I to allow BP room for rate control agents - but would hope to restart if BP tolerates.   Problem List Items Addressed This Visit    Acute on chronic diastolic CHF (congestive heart failure), NYHA class 3 - Primary    I suspect that she has been in Afib for > 3 days given progression of HF Sx from SOE to PND/Orthopnea & now edema.  Will need more aggressive diuretic while monitored as an inpatient -- IV Lasix 20 mg bid to start (relatively naiive).        Relevant Medications   metoprolol tartrate (LOPRESSOR) tablet 25 mg (Completed)   apixaban (ELIQUIS) tablet 5 mg (Completed)   Other Relevant Orders   EKG 12-Lead   Atrial fibrillation with RVR    Given Lopressor 25 mg PO x 1 along with Eliquis. Upon admission, would start IV CCB gtt & plan TEE/DCCV in AM. It would be interesting to see how she feels with rate control, but in setting of Acute HFPEF (DHF), would prefer restoration of NSR.      Relevant Medications   metoprolol tartrate (LOPRESSOR) tablet 25 mg (Completed)   apixaban (ELIQUIS) tablet 5 mg (Completed)   Other Relevant Orders   EKG 12-Lead   Chronic diastolic CHF (congestive heart failure), NYHA class 2 (Chronic)   Relevant Medications   metoprolol tartrate (LOPRESSOR) tablet 25 mg (Completed)   apixaban (ELIQUIS) tablet 5 mg (Completed)   Essential hypertension (Chronic)    BP currently stable on low dose ACE-I, but will need  to monitor with IV rate control Rx (Diltiazem). Will need to assess BP w/ & w/out ACE-I once rate control added.      Relevant Medications   metoprolol tartrate (LOPRESSOR) tablet 25 mg (Completed)   apixaban (ELIQUIS) tablet 5 mg (Completed)   New onset a-fib    New Dx - clearly intolerant of RVR - for now would prefer to attempt to re-establish NSR. Would probably opt for BB/CCB Rx for rate control initially, but if Afib recurs or symptomatic in Afib despite rate control, would have a low  threshold to use rhythm control.  Initiate DOAC - & plan TEE/DCCV tomorrow as inpatient.      Relevant Medications   metoprolol tartrate (LOPRESSOR) tablet 25 mg (Completed)   apixaban (ELIQUIS) tablet 5 mg (Completed)   Other Relevant Orders   EKG 12-Lead   Symptomatic anemia      Meds ordered this encounter  Medications  . metoprolol tartrate (LOPRESSOR) tablet 25 mg    Sig:   . apixaban (ELIQUIS) tablet 5 mg    Sig:      Post-discharge, she should initially f/u in the Afib clinic to adjust appropriate medications for rate/rhythm control. Will also need OP Myoview to exclude an ischemic etiology.   Yarima Penman W. Ellyn Hack, M.D., M.S. Interventional Cardiolgy [facility]     Copied Clinic Note as H&P  Monterrius Cardosa, Leonie Green, MD

## 2014-10-15 NOTE — Progress Notes (Signed)
Patient seen and examined. Laying comfortable in bed.   Neck: No JVD Lungs: Clears to auscultation.  Heart: Ir Ir with SEM  Extremities: 2+ BL LE edema.   She has received Metoprolol 25 mg PO x 1, Lasix 40mg  PO x 1 & Eliquis 5mg  at Clinic.  Plan: will start on IV cardizem and Eliquis BID. Plan for TEE/DCCV tomorrow. IV lasix 20mg  BID for diuresis. Monitor hemoglobin level closely.   After careful review of history and examination, the risks and benefits of transesophageal echocardiogram have been explained including risks of esophageal damage, perforation (1:10,000 risk), bleeding, pharyngeal hematoma as well as other potential complications associated with conscious sedation including aspiration, arrhythmia, respiratory failure and death. Alternatives to treatment were discussed, questions were answered. Patient is willing to proceed.  NPO after midnight. Meds with sips.   Marcelene Weidemann, PA-C 10/15/2014 4:07 PM

## 2014-10-15 NOTE — Telephone Encounter (Signed)
Dr. Ellyn Hack request that one of the PA's covering the hospital write admission orders on patient.  Dr. Ellyn Hack has written H and P  Nurse covering patient on floor called and said to have PA who is covering for heart care to write admit orders  The nurse will contact [place] and contact who is covering

## 2014-10-15 NOTE — Assessment & Plan Note (Signed)
BP currently stable on low dose ACE-I, but will need to monitor with IV rate control Rx (Diltiazem). Will need to assess BP w/ & w/out ACE-I once rate control added.

## 2014-10-15 NOTE — Patient Instructions (Signed)
WHEN YOU GO HOME TAKE 40 MG LASIX   YOU WILL BE ADMITTED TO [facility] WILL CONTACT YOU WHEN ABED IS AVAILABLE  Your physician has recommended that you have a TEE with Cardioversion (DCCV) tomorrow. Electrical Cardioversion uses a jolt of electricity to your heart either through paddles or wired patches attached to your chest. This is a controlled, usually prescheduled, procedure. Defibrillation is done under light anesthesia in the hospital, and you usually go home the day of the procedure. This is done to get your heart back into a normal rhythm. You are not awake for the procedure. Please see the instruction sheet given to you today.  You have received metoprolol tart. 25 mg And Eliquis 5 MG

## 2014-10-15 NOTE — Assessment & Plan Note (Signed)
New Dx - clearly intolerant of RVR - for now would prefer to attempt to re-establish NSR. Would probably opt for BB/CCB Rx for rate control initially, but if Afib recurs or symptomatic in Afib despite rate control, would have a low threshold to use rhythm control.  Initiate DOAC - & plan TEE/DCCV tomorrow as inpatient.

## 2014-10-15 NOTE — Assessment & Plan Note (Signed)
I suspect that she has been in Afib for > 3 days given progression of HF Sx from SOE to PND/Orthopnea & now edema.   Will need more aggressive diuretic while monitored as an inpatient -- IV Lasix 20 mg bid to start (relatively naiive).

## 2014-10-15 NOTE — Progress Notes (Signed)
Direct admit patient [name] admitted to room 3E03. Patient arrived via wheelchair accompanied by patient representative. Patient alert, oriented, and in no apparent discomfort or distress. Patient denies discomfort or distress. Patient vitals/weight obtained, IV placed, telemetry placed, patient oriented to room/unit. Bed low and locked, side rails up x2, call light and phone within reach.  Attempting to call Dr. Ellyn Hack to notify of patient's arrival. No orders in place. Will continue to monitor. Roselyn Reef Debralee Braaksma,RN

## 2014-10-16 ENCOUNTER — Other Ambulatory Visit: Payer: Self-pay | Admitting: Cardiology

## 2014-10-16 ENCOUNTER — Other Ambulatory Visit: Payer: Self-pay

## 2014-10-16 ENCOUNTER — Observation Stay (HOSPITAL_COMMUNITY): Payer: Medicare HMO | Admitting: Anesthesiology

## 2014-10-16 ENCOUNTER — Observation Stay (HOSPITAL_BASED_OUTPATIENT_CLINIC_OR_DEPARTMENT_OTHER): Payer: Medicare HMO

## 2014-10-16 ENCOUNTER — Encounter (HOSPITAL_COMMUNITY): Payer: Self-pay | Admitting: *Deleted

## 2014-10-16 ENCOUNTER — Encounter (HOSPITAL_COMMUNITY)
Admission: AD | Disposition: A | Discharge status: Home / Self Care | Payer: Self-pay | Source: Ambulatory Visit | Attending: Cardiology

## 2014-10-16 ENCOUNTER — Telehealth: Payer: Self-pay | Admitting: Cardiology

## 2014-10-16 ENCOUNTER — Ambulatory Visit (HOSPITAL_COMMUNITY): Admission: RE | Admit: 2014-10-16 | Payer: Medicare HMO | Source: Ambulatory Visit | Admitting: Cardiology

## 2014-10-16 DIAGNOSIS — I4892 Unspecified atrial flutter: Secondary | ICD-10-CM | POA: Diagnosis not present

## 2014-10-16 DIAGNOSIS — I5031 Acute diastolic (congestive) heart failure: Secondary | ICD-10-CM | POA: Diagnosis present

## 2014-10-16 DIAGNOSIS — Z7901 Long term (current) use of anticoagulants: Secondary | ICD-10-CM

## 2014-10-16 DIAGNOSIS — D509 Iron deficiency anemia, unspecified: Secondary | ICD-10-CM

## 2014-10-16 DIAGNOSIS — I4891 Unspecified atrial fibrillation: Secondary | ICD-10-CM

## 2014-10-16 DIAGNOSIS — I5042 Chronic combined systolic (congestive) and diastolic (congestive) heart failure: Secondary | ICD-10-CM

## 2014-10-16 DIAGNOSIS — I480 Paroxysmal atrial fibrillation: Secondary | ICD-10-CM

## 2014-10-16 DIAGNOSIS — I10 Essential (primary) hypertension: Secondary | ICD-10-CM | POA: Diagnosis not present

## 2014-10-16 DIAGNOSIS — I5033 Acute on chronic diastolic (congestive) heart failure: Secondary | ICD-10-CM | POA: Diagnosis not present

## 2014-10-16 HISTORY — PX: CARDIOVERSION: SHX1299

## 2014-10-16 HISTORY — PX: TEE WITHOUT CARDIOVERSION: SHX5443

## 2014-10-16 HISTORY — DX: Chronic combined systolic (congestive) and diastolic (congestive) heart failure: I50.42

## 2014-10-16 HISTORY — DX: Long term (current) use of anticoagulants: Z79.01

## 2014-10-16 LAB — BASIC METABOLIC PANEL
Anion gap: 10 (ref 5–15)
BUN: 12 mg/dL (ref 6–20)
CO2: 29 mmol/L (ref 22–32)
Calcium: 8.9 mg/dL (ref 8.9–10.3)
Chloride: 102 mmol/L (ref 101–111)
Creatinine, Ser: 0.68 mg/dL (ref 0.44–1.00)
GFR calc Af Amer: >60 mL/min (ref >60)
GFR calc non Af Amer: >60 mL/min (ref >60)
Glucose, Bld: 108 mg/dL — ABNORMAL HIGH (ref 65–99)
Potassium: 3.4 mmol/L — ABNORMAL LOW (ref 3.5–5.1)
Sodium: 141 mmol/L (ref 135–145)

## 2014-10-16 LAB — CBC
HCT: 26.8 % — ABNORMAL LOW (ref 36.0–46.0)
Hemoglobin: 8.8 g/dL — ABNORMAL LOW (ref 12.0–15.0)
MCH: 28.3 pg (ref 26.0–34.0)
MCHC: 32.8 g/dL (ref 30.0–36.0)
MCV: 86.2 fL (ref 78.0–100.0)
Platelets: 204 10*3/uL (ref 150–400)
RBC: 3.11 MIL/uL — ABNORMAL LOW (ref 3.87–5.11)
RDW: 17.6 % — ABNORMAL HIGH (ref 11.5–15.5)
WBC: 2.3 10*3/uL — ABNORMAL LOW (ref 4.0–10.5)

## 2014-10-16 SURGERY — ECHOCARDIOGRAM, TRANSESOPHAGEAL
Anesthesia: Monitor Anesthesia Care

## 2014-10-16 MED ORDER — APIXABAN 5 MG PO TABS
5.0000 mg | ORAL_TABLET | Freq: Two times a day (BID) | ORAL | Status: DC
Start: 2014-10-16 — End: 2014-11-09

## 2014-10-16 MED ORDER — LACTATED RINGERS IV SOLN
INTRAVENOUS | Status: DC | PRN
Start: 2014-10-16 — End: 2014-10-16
  Administered 2014-10-16: 10:00:00 via INTRAVENOUS

## 2014-10-16 MED ORDER — APIXABAN 5 MG PO TABS: 5.0000 mg | ORAL_TABLET | Freq: Two times a day (BID) | ORAL | Status: DC

## 2014-10-16 MED ORDER — ACETAMINOPHEN 325 MG PO TABS: 650.0000 mg | ORAL_TABLET | ORAL | Status: DC | PRN

## 2014-10-16 MED ORDER — LIDOCAINE HCL (CARDIAC) 20 MG/ML IV SOLN
INTRAVENOUS | Status: DC | PRN
  Administered 2014-10-16: 40 mg via INTRAVENOUS

## 2014-10-16 MED ORDER — PROPOFOL 10 MG/ML IV BOLUS
INTRAVENOUS | Status: DC | PRN
Start: 2014-10-16 — End: 2014-10-16
  Administered 2014-10-16: 20 mg via INTRAVENOUS
  Administered 2014-10-16: 40 mg via INTRAVENOUS
  Administered 2014-10-16: 20 mg via INTRAVENOUS

## 2014-10-16 MED ORDER — BUTAMBEN-TETRACAINE-BENZOCAINE 2-2-14 % EX AERO
INHALATION_SPRAY | CUTANEOUS | Status: DC | PRN
  Administered 2014-10-16: 2 via TOPICAL

## 2014-10-16 MED ORDER — PROPOFOL 500 MG/50ML IV EMUL
INTRAVENOUS | Status: DC | PRN
  Administered 2014-10-16: 50 ug/kg/min via INTRAVENOUS

## 2014-10-16 NOTE — Anesthesia Preprocedure Evaluation (Signed)
Anesthesia Evaluation  Patient identified by MRN, date of birth, ID band Patient awake    Reviewed: Allergy & Precautions, NPO status , Patient's Chart, lab work & pertinent test results  Airway Mallampati: II  TM Distance: >3 FB Neck ROM: Full    Dental  (+) Teeth Intact   Pulmonary    breath sounds clear to auscultation       Cardiovascular hypertension,  Rhythm:Irregular Rate:Normal     Neuro/Psych    GI/Hepatic   Endo/Other    Renal/GU      Musculoskeletal   Abdominal   Peds  Hematology   Anesthesia Other Findings   Reproductive/Obstetrics                             Anesthesia Physical Anesthesia Plan  ASA: III  Anesthesia Plan: General   Post-op Pain Management:    Induction:   Airway Management Planned: Mask  Additional Equipment:   Intra-op Plan:   Post-operative Plan:   Informed Consent: I have reviewed the patients History and Physical, chart, labs and discussed the procedure including the risks, benefits and alternatives for the proposed anesthesia with the patient or authorized representative who has indicated his/her understanding and acceptance.     Plan Discussed with: CRNA and Anesthesiologist  Anesthesia Plan Comments:         Anesthesia Quick Evaluation

## 2014-10-16 NOTE — Progress Notes (Signed)
  Patient Name: Amanda Sellers Date of Encounter: 10/16/2014  Active Problems:   Atrial flutter with rapid ventricular response   Length of Stay: 1  SUBJECTIVE  Maintaining NSR, frequent PACs after cardioversion. Feels well.  CURRENT MEDS . apixaban  5 mg Oral BID  . ferrous sulfate  325 mg Oral BID WC  . furosemide  20 mg Intravenous BID  . levothyroxine  100 mcg Oral QAC breakfast  . lisinopril  2.5 mg Oral Daily  . omega-3 acid ethyl esters  2 g Oral Daily  . pravastatin  80 mg Oral Daily  . Vitamin D (Ergocalciferol)  50,000 Units Oral Weekly    OBJECTIVE   Intake/Output Summary (Last 24 hours) at 10/16/14 1330 Last data filed at 10/16/14 1106  Gross per 24 hour  Intake    640 ml  Output   3500 ml  Net  -2860 ml   Filed Weights   10/15/14 1409 10/16/14 0454  Weight: 172 lb 4.8 oz (78.155 kg) 168 lb 3.2 oz (76.295 kg)    PHYSICAL EXAM Filed Vitals:   10/16/14 1033 10/16/14 1040 10/16/14 1045 10/16/14 1105  BP: 108/56 115/54  133/52  Pulse: 89 84  85  Temp: 97.7 F (36.5 C)     TempSrc: Oral     Resp: 26 20  18   Height:      Weight:      SpO2: 97% 99% 95% 100%   General: Alert, oriented x3, no distress Head: no evidence of trauma, PERRL, EOMI, no exophtalmos or lid lag, no myxedema, no xanthelasma; normal ears, nose and oropharynx Neck: normal jugular venous pulsations and no hepatojugular reflux; brisk carotid pulses without delay and no carotid bruits Chest: clear to auscultation, no signs of consolidation by percussion or palpation, normal fremitus, symmetrical and full respiratory excursions Cardiovascular: normal position and quality of the apical impulse, regular rhythm with frequent ectopy, normal first and second heart sounds, no rubs or gallops, no murmur Abdomen: no tenderness or distention, no masses by palpation, no abnormal pulsatility or arterial bruits, normal bowel sounds, no hepatosplenomegaly Extremities: no clubbing, cyanosis or edema;  2+ radial, ulnar and brachial pulses bilaterally; 2+ right femoral, posterior tibial and dorsalis pedis pulses; 2+ left femoral, posterior tibial and dorsalis pedis pulses; no subclavian or femoral bruits Neurological: grossly nonfocal  LABS  CBC  Recent Labs  10/14/14 0756 10/16/14 0350  WBC 3.0* 2.3*  NEUTROABS 1.9  --   HGB 8.8* 8.8*  HCT 27.0* 26.8*  MCV 84.1 86.2  PLT 217 854   Basic Metabolic Panel  Recent Labs  10/15/14 1700 10/16/14 0350  NA 140 141  K 3.5 3.4*  CL 105 102  CO2 30 29  GLUCOSE 130* 108*  BUN 13 12  CREATININE 0.65 0.68  CALCIUM 9.0 8.9  MG 2.0  --    Liver Function Tests  Recent Labs  10/15/14 1700  AST 27  ALT 16  ALKPHOS 67  BILITOT 0.3  PROT 5.7*  ALBUMIN 3.1*   Thyroid Function Tests  Recent Labs  10/15/14 1700  TSH 3.203    Radiology Studies Imaging results have been reviewed and No results found.  TELE NSR with PACs  ECG NSR  ASSESSMENT AND PLAN   DC home. Mandatory uninterrupted anticoagulation for next 30 days, preferably lifelong barring bleeding complications AFib clinic follow up in 1-2 weeks. Outpatient [facility], non-urgent.  Sanda Klein, MD, [facility] [phone] office [phone] pager 10/16/2014 1:30 PM

## 2014-10-16 NOTE — Progress Notes (Signed)
  Echocardiogram Echocardiogram Transesophageal has been performed.  Johny Chess 10/16/2014, 10:24 AM

## 2014-10-16 NOTE — Progress Notes (Signed)
Pt has orders to be discharged. Discharge instructions given and pt has no additional questions at this time. Medication regimen reviewed and pt educated. Pt verbalized understanding and has no additional questions. Telemetry box removed. IV removed and site in good condition. Pt stable and waiting for transportation.   Jennetta Flood RN 

## 2014-10-16 NOTE — Discharge Instructions (Signed)
DO NOT MISS ANY ELIQUIS THIS IS TO PREVENT STROKES  Heart healthy diet  We have scheduled you for a stress test to make sure there is no lack of blood supply to your heart. You will not walk on a treadmill for this test.  Do not eat or drink after 5 AM that day.  Do not wear perfume that day.  No caffeine or mild for 24 hours prior to the test.   Call if any problems.  Your first appointment will be with the atrial fibrillation clinic.   If you develop shortness of breath or swelling or blood in your stools or urine call our office.  If it is severe go to ER.  We will check you blood level on Tuesday to make sure you do not become anemic.     Have lab work done on Tuesday the 27th of Sept to check your blood count on 1st floor of Dr. Allison Quarry office building.

## 2014-10-16 NOTE — Discharge Summary (Signed)
Physician Discharge Summary       Patient ID: [name] MRN: [id number] DOB/AGE: Dec 14, 1932 81 y.o.  Admit date: 10/15/2014 Discharge date: 10/16/2014 Primary Cardiologist:Dr. Ellyn Hack  Discharge Diagnoses:  Principal Problem:   Atrial flutter with rapid ventricular response,maintaining SR after TEE/DCCV Active Problems:   Chronic diastolic CHF (congestive heart failure), NYHA class 2   Essential hypertension   Microcytic anemia   New onset a-fib   Anticoagulation adequate, Eliquis   Acute diastolic HF (heart failure)   Discharged Condition: good  Procedures: TEE and cardioversion 10/16/14 by Dr. Talmadge Chad Course: Amanda Sellers is a 81 y.o. female with a PMH below who presented to the office yesterday for 2 month f/u visit after establishing cardiology care for diastolic heart failure. She is also getting Oncology Rx for Lymphoma.  She had been doing well then last few weeks has had exertional dyspnea followed by PND & orhtopnea. Over the past ~2-3 days, she has now noted worsening edema. She feels very tired & has not energy. She has noted a few palpitations, but does not recall rapid irregular heart rates/rhythms. She has not had any chest tightness or pressure with rest, but does note a bit of chest tightness when she has been trying to walk in a hurry over the past few days.  She has noted some dizziness with exertion. She was found to be in a fib with RVR.  She was admitted to [facility] on IV dilt and given diuretics for acute on chronic systolic HF.   It was arranged for to undergo TEE DCCV.  Today she had TEE without thrombus and underwent DCCV that was successful with one shock of 120J. She did well post procedure and was seen by Dr. Jerilynn Mages. Croitoru and found stable for discharge.   She is negative 2700 since admit and weight is down 4 lbs with IV diuretics.  D/C wt is 168.  Plan for \\mMandatory  uninterrupted anticoagulation for next 30 days, preferably lifelong  barring bleeding complications.  AFib clinic follow up in 1-2 weeks. Outpatient [facility], non-urgent.  With follow up with Dr. Ellyn Hack post nuc.  I will have her have a CBC on the 27th of Sept to recheck her HGB, is not seen by oncology until Oct.  We stopped her ASA.  Consults: None  Significant Diagnostic Studies:  BMP Latest Ref Rng 10/16/2014 10/15/2014 09/30/2014  Glucose 65 - 99 mg/dL 108(H) 130(H) 94  BUN 6 - 20 mg/dL 12 13 16.8  Creatinine 0.44 - 1.00 mg/dL 0.68 0.65 0.7  Sodium 135 - 145 mmol/L 141 140 141  Potassium 3.5 - 5.1 mmol/L 3.4(L) 3.5 3.9  Chloride 101 - 111 mmol/L 102 105 -  CO2 22 - 32 mmol/L 29 30 27   Calcium 8.9 - 10.3 mg/dL 8.9 9.0 8.9   CBC Latest Ref Rng 10/16/2014 10/14/2014 09/30/2014  WBC 4.0 - 10.5 K/uL 2.3(L) 3.0(L) 2.9(L)  Hemoglobin 12.0 - 15.0 g/dL 8.8(L) 8.8(L) 10.1(L)  Hematocrit 36.0 - 46.0 % 26.8(L) 27.0(L) 30.3(L)  Platelets 150 - 400 K/uL 204 217 161    TSH 3.203   TEE: Study Conclusions - Left ventricle: Systolic function was normal. The estimated ejection fraction was in the range of 55% to 60%. Wall motion was normal; there were no regional wall motion abnormalities. - Aortic valve: No evidence of vegetation. There was mild regurgitation. - Descending aorta: The descending aorta had mild to moderate diffuse disease. - Mitral valve: No evidence of vegetation. There was  moderate regurgitation. - Left atrium: The atrium was moderately to severely dilated. No evidence of thrombus in the atrial cavity or appendage. There was moderate spontaneous echo contrast (&quot;smoke&quot;) in the appendage. - Right atrium: The atrium was mildly dilated. No evidence of thrombus in the atrial cavity or appendage. - Atrial septum: No defect or patent foramen ovale was identified. - Tricuspid valve: No evidence of vegetation. - Pulmonic valve: No evidence of vegetation. Impressions: - Normal LV function; biatrial enlargment;  spontaneous contrast in LAA but no thrombus; mild AI; moderate MR (2+); mild TR.   Discharge Exam: Blood pressure 133/52, pulse 85, temperature 97.7 F (36.5 C), temperature source Oral, resp. rate 18, height 5\' 7"  (1.702 m), weight 168 lb 3.2 oz (76.295 kg), SpO2 100 %.  Disposition: 01-Home or Self Care     Medication List    STOP taking these medications        aspirin 81 MG tablet      TAKE these medications        acetaminophen 325 MG tablet  Commonly known as:  TYLENOL  Take 2 tablets (650 mg total) by mouth every 4 (four) hours as needed for headache or mild pain.     ALPRAZolam 0.5 MG tablet  Commonly known as:  XANAX  Take 1 tablet by mouth every 6 (six) hours as needed. anxiety     apixaban 5 MG Tabs tablet  Commonly known as:  ELIQUIS  Take 1 tablet (5 mg total) by mouth 2 (two) times daily.     ergocalciferol 50000 UNITS capsule  Commonly known as:  VITAMIN D2  Take 50,000 Units by mouth once a week. On Sundays     ferrous sulfate 325 (65 FE) MG tablet  Take 1 tablet (325 mg total) by mouth 2 (two) times daily with a meal.     fish oil-omega-3 fatty acids 1000 MG capsule  Take 2 g by mouth daily.     furosemide 40 MG tablet  Commonly known as:  LASIX  1/2 tab po qd     levothyroxine 100 MCG tablet  Commonly known as:  SYNTHROID, LEVOTHROID  Take 1 tablet (100 mcg total) by mouth daily before breakfast.     lisinopril 2.5 MG tablet  Commonly known as:  PRINIVIL,ZESTRIL  Take 1 tablet (2.5 mg total) by mouth daily.     OXYGEN  Inhale into the lungs. 1Lt at bedtime     pravastatin 80 MG tablet  Commonly known as:  PRAVACHOL  Take 80 mg by mouth daily.     PRESCRIPTION MEDICATION  Chemo. Last dose was 10-02-14 at [facility].     prochlorperazine 5 MG tablet  Commonly known as:  COMPAZINE  Take 1 tablet (5 mg total) by mouth every 6 (six) hours as needed for nausea or vomiting.     prochlorperazine 10 MG tablet  Commonly known as:  COMPAZINE    Take 1 tablet by mouth 3 (three) times daily before meals.       Follow-up Information    Follow up with CARROLL,DONNA, NP On 10/28/2014.   Specialties:  Nurse Practitioner, Cardiology   Why:  at 10:00 AM   Contact information:   [place] [address] [phone]       Follow up with [facility] On 10/29/2014.   Why:  ast 9:15 AM for stress test not where you walk though   Contact information:   [place] [address]  [phone]      Follow up with Leonie Man, MD On 11/09/2014.   Specialty:  Cardiology   Why:  at 11:15 AM   Contact information:   [address] [place] [address] [phone]        Discharge Instructions: DO NOT Pinal THIS IS TO PREVENT STROKES  Heart healthy diet  We have scheduled you for a stress test to make sure there is no lack of blood supply to your heart. You will not walk on a treadmill for this test.  Do not eat or drink after 5 AM that day.  Do not wear perfume that day.  No caffeine or mild for 24 hours prior to the test.   Call if any problems.  Your first appointment will be with the atrial fibrillation clinic.   If you develop shortness of breath or swelling or blood in your stools or urine call our office.  If it is severe go to ER.  We will check you blood level on Tuesday to make sure you do not become anemic.      Signed: Isaiah Serge Nurse Practitioner-Certified [facility]: [facility] 10/16/2014, 3:33 PM  Time spent on discharge : > 30 minutes.

## 2014-10-16 NOTE — CV Procedure (Signed)
See full TEE report in [employer]; patient sedated by anesthesia with total of 170 mg diprovan IV; no LAA thrombus; patient subsequently had DCCV with 120 J resulting in NSR; no immediate complications; continue apixaban at least 4 weeks. [name]

## 2014-10-16 NOTE — Transfer of Care (Signed)
Immediate Anesthesia Transfer of Care Note  Patient: Amanda Sellers  Procedure(s) Performed: Procedure(s): TRANSESOPHAGEAL ECHOCARDIOGRAM (TEE) (N/A) CARDIOVERSION (N/A)  Patient Location: Endoscopy Unit  Anesthesia Type:MAC  Level of Consciousness: awake, oriented and patient cooperative  Airway & Oxygen Therapy: Patient Spontanous Breathing and Patient connected to nasal cannula oxygen  Post-op Assessment: Report given to RN and Post -op Vital signs reviewed and stable  Post vital signs: Reviewed  Last Vitals:  Filed Vitals:   10/16/14 1033  BP: 108/56  Pulse: 89  Temp: 36.5 C  Resp: 26    Complications: No apparent anesthesia complications

## 2014-10-16 NOTE — Telephone Encounter (Signed)
Pt would like discount card for Eliquis please.

## 2014-10-16 NOTE — Anesthesia Postprocedure Evaluation (Signed)
  Anesthesia Post-op Note  Patient: Amanda Sellers  Procedure(s) Performed: Procedure(s): TRANSESOPHAGEAL ECHOCARDIOGRAM (TEE) (N/A) CARDIOVERSION (N/A)  Patient Location: PACU  Anesthesia Type:General  Level of Consciousness: awake, alert  and oriented  Airway and Oxygen Therapy: Patient Spontanous Breathing  Post-op Pain: mild  Post-op Assessment: Post-op Vital signs reviewed, Patient's Cardiovascular Status Stable, Respiratory Function Stable, Patent Airway and Pain level controlled              Post-op Vital Signs: stable  Last Vitals:  Filed Vitals:   10/16/14 1105  BP: 133/52  Pulse: 85  Temp:   Resp: 18    Complications: No apparent anesthesia complications

## 2014-10-16 NOTE — Anesthesia Procedure Notes (Signed)
Procedure Name: MAC Date/Time: 10/16/2014 10:17 AM Performed by: Jenne Campus Pre-anesthesia Checklist: Patient identified, Emergency Drugs available, Suction available, Patient being monitored and Timeout performed Patient Re-evaluated:Patient Re-evaluated prior to inductionOxygen Delivery Method: Ambu bag

## 2014-10-16 NOTE — Telephone Encounter (Signed)
Pt informed discount card left at front desk.

## 2014-10-19 ENCOUNTER — Telehealth: Payer: Self-pay | Admitting: Cardiology

## 2014-10-19 LAB — CBC
HCT: 28 % — ABNORMAL LOW (ref 36.0–46.0)
Hemoglobin: 9.4 g/dL — ABNORMAL LOW (ref 12.0–15.0)
MCH: 28 pg (ref 26.0–34.0)
MCHC: 33.6 g/dL (ref 30.0–36.0)
MCV: 83.3 fL (ref 78.0–100.0)
MPV: 8.3 fL — ABNORMAL LOW (ref 8.6–12.4)
Platelets: 210 10*3/uL (ref 150–400)
RBC: 3.36 MIL/uL — ABNORMAL LOW (ref 3.87–5.11)
RDW: 17.5 % — ABNORMAL HIGH (ref 11.5–15.5)
WBC: 2.6 10*3/uL — ABNORMAL LOW (ref 4.0–10.5)

## 2014-10-19 NOTE — Telephone Encounter (Signed)
D/C phone call .Marland Kitchen Appt is on 11/09/14 at 11:15 am w/Dr. Ellyn Hack at the [facility] office   Thanks

## 2014-10-19 NOTE — Telephone Encounter (Signed)
Daughter walked in office stating mother was started on Eliqius 5mg ,was told to pick up savings card.Eliquis 5 mg samples and a savings card given to daughter.

## 2014-10-19 NOTE — Telephone Encounter (Signed)
TOC call to patient no answer.No voice mail.

## 2014-10-20 ENCOUNTER — Encounter (HOSPITAL_COMMUNITY): Payer: Self-pay | Admitting: Cardiology

## 2014-10-21 NOTE — Telephone Encounter (Signed)
CALLED PATIENT PHONE WAS PICKED UP - "HELLO" THEN HUNG UP RETRIED CALL NO ANSWER , NO WAY TO LEAVE MESSAGE

## 2014-10-22 NOTE — Telephone Encounter (Signed)
TOC call to patient no answer.Unable to leave message no voice mail.

## 2014-10-25 ENCOUNTER — Other Ambulatory Visit: Payer: Self-pay | Admitting: Oncology

## 2014-10-27 ENCOUNTER — Telehealth: Payer: Self-pay | Admitting: *Deleted

## 2014-10-27 ENCOUNTER — Ambulatory Visit (HOSPITAL_BASED_OUTPATIENT_CLINIC_OR_DEPARTMENT_OTHER): Payer: Medicare HMO | Admitting: Oncology

## 2014-10-27 ENCOUNTER — Telehealth: Payer: Self-pay | Admitting: Oncology

## 2014-10-27 ENCOUNTER — Ambulatory Visit (HOSPITAL_BASED_OUTPATIENT_CLINIC_OR_DEPARTMENT_OTHER): Payer: Medicare HMO

## 2014-10-27 ENCOUNTER — Other Ambulatory Visit (HOSPITAL_BASED_OUTPATIENT_CLINIC_OR_DEPARTMENT_OTHER): Payer: Medicare HMO

## 2014-10-27 ENCOUNTER — Telehealth (HOSPITAL_COMMUNITY): Payer: Self-pay

## 2014-10-27 VITALS — BP 107/65 | HR 90 | Resp 16

## 2014-10-27 VITALS — BP 132/89 | HR 113 | Temp 97.8°F | Resp 18 | Ht 67.0 in | Wt 172.2 lb

## 2014-10-27 DIAGNOSIS — R0609 Other forms of dyspnea: Secondary | ICD-10-CM

## 2014-10-27 DIAGNOSIS — C8290 Follicular lymphoma, unspecified, unspecified site: Secondary | ICD-10-CM

## 2014-10-27 DIAGNOSIS — Z5111 Encounter for antineoplastic chemotherapy: Secondary | ICD-10-CM

## 2014-10-27 DIAGNOSIS — Z23 Encounter for immunization: Secondary | ICD-10-CM | POA: Diagnosis not present

## 2014-10-27 DIAGNOSIS — I4891 Unspecified atrial fibrillation: Secondary | ICD-10-CM

## 2014-10-27 DIAGNOSIS — I5033 Acute on chronic diastolic (congestive) heart failure: Secondary | ICD-10-CM

## 2014-10-27 DIAGNOSIS — R0601 Orthopnea: Secondary | ICD-10-CM | POA: Diagnosis not present

## 2014-10-27 DIAGNOSIS — Z5112 Encounter for antineoplastic immunotherapy: Secondary | ICD-10-CM

## 2014-10-27 LAB — CBC WITH DIFFERENTIAL/PLATELET
BASO%: 0.3 % (ref 0.0–2.0)
Basophils Absolute: 0 10*3/uL (ref 0.0–0.1)
EOS%: 5.5 % (ref 0.0–7.0)
Eosinophils Absolute: 0.1 10*3/uL (ref 0.0–0.5)
HCT: 28.4 % — ABNORMAL LOW (ref 34.8–46.6)
HGB: 9.6 g/dL — ABNORMAL LOW (ref 11.6–15.9)
LYMPH%: 23.4 % (ref 14.0–49.7)
MCH: 28.6 pg (ref 25.1–34.0)
MCHC: 33.8 g/dL (ref 31.5–36.0)
MCV: 84.4 fL (ref 79.5–101.0)
MONO#: 0.4 10*3/uL (ref 0.1–0.9)
MONO%: 14.6 % — ABNORMAL HIGH (ref 0.0–14.0)
NEUT#: 1.5 10*3/uL (ref 1.5–6.5)
NEUT%: 56.2 % (ref 38.4–76.8)
Platelets: 139 10*3/uL — ABNORMAL LOW (ref 145–400)
RBC: 3.36 10*6/uL — ABNORMAL LOW (ref 3.70–5.45)
RDW: 18.5 % — ABNORMAL HIGH (ref 11.2–14.5)
WBC: 2.7 10*3/uL — ABNORMAL LOW (ref 3.9–10.3)
lymph#: 0.6 10*3/uL — ABNORMAL LOW (ref 0.9–3.3)

## 2014-10-27 LAB — COMPREHENSIVE METABOLIC PANEL (CC13)
ALT: 18 U/L (ref 0–55)
AST: 22 U/L (ref 5–34)
Albumin: 3.6 g/dL (ref 3.5–5.0)
Alkaline Phosphatase: 72 U/L (ref 40–150)
Anion Gap: 6 mEq/L (ref 3–11)
BUN: 12.1 mg/dL (ref 7.0–26.0)
CO2: 28 mEq/L (ref 22–29)
Calcium: 9 mg/dL (ref 8.4–10.4)
Chloride: 107 mEq/L (ref 98–109)
Creatinine: 0.7 mg/dL (ref 0.6–1.1)
EGFR: 84 mL/min/{1.73_m2} — ABNORMAL LOW (ref >90)
Glucose: 98 mg/dl (ref 70–140)
Potassium: 3.6 mEq/L (ref 3.5–5.1)
Sodium: 142 mEq/L (ref 136–145)
Total Bilirubin: 0.59 mg/dL (ref 0.20–1.20)
Total Protein: 6.2 g/dL — ABNORMAL LOW (ref 6.4–8.3)

## 2014-10-27 LAB — TECHNOLOGIST REVIEW

## 2014-10-27 MED ORDER — SODIUM CHLORIDE 0.9 % IV SOLN
375.0000 mg/m2 | Freq: Once | INTRAVENOUS | Status: AC
  Administered 2014-10-27: 700 mg via INTRAVENOUS
  Filled 2014-10-27: qty 60

## 2014-10-27 MED ORDER — SODIUM CHLORIDE 0.9 % IV SOLN
Freq: Once | INTRAVENOUS | Status: AC
  Administered 2014-10-27: 12:00:00 via INTRAVENOUS
  Filled 2014-10-27: qty 4

## 2014-10-27 MED ORDER — ACETAMINOPHEN 325 MG PO TABS
ORAL_TABLET | ORAL | Status: AC
  Filled 2014-10-27: qty 2

## 2014-10-27 MED ORDER — ACETAMINOPHEN 325 MG PO TABS
650.0000 mg | ORAL_TABLET | Freq: Once | ORAL | Status: AC
  Administered 2014-10-27: 650 mg via ORAL

## 2014-10-27 MED ORDER — SODIUM CHLORIDE 0.9 % IV SOLN
Freq: Once | INTRAVENOUS | Status: AC
  Administered 2014-10-27: 11:00:00 via INTRAVENOUS

## 2014-10-27 MED ORDER — SODIUM CHLORIDE 0.9 % IV SOLN
70.0000 mg/m2 | Freq: Once | INTRAVENOUS | Status: AC
  Administered 2014-10-27: 125 mg via INTRAVENOUS
  Filled 2014-10-27: qty 5

## 2014-10-27 MED ORDER — DIPHENHYDRAMINE HCL 25 MG PO CAPS
ORAL_CAPSULE | ORAL | Status: AC
  Filled 2014-10-27: qty 2

## 2014-10-27 MED ORDER — INFLUENZA VAC SPLIT QUAD 0.5 ML IM SUSY
0.5000 mL | PREFILLED_SYRINGE | Freq: Once | INTRAMUSCULAR | Status: AC
  Administered 2014-10-27: 0.5 mL via INTRAMUSCULAR
  Filled 2014-10-27: qty 0.5

## 2014-10-27 MED ORDER — DIPHENHYDRAMINE HCL 25 MG PO CAPS
50.0000 mg | ORAL_CAPSULE | Freq: Once | ORAL | Status: AC
  Administered 2014-10-27: 50 mg via ORAL

## 2014-10-27 NOTE — Telephone Encounter (Signed)
Pt confirmed labs/ov per 10/04 POF, gave pt AVS and Calendar.... KJ, gave pt barium °

## 2014-10-27 NOTE — Progress Notes (Signed)
  [facility] OFFICE PROGRESS NOTE   Diagnosis: Non-Hodgkin's lymphoma  INTERVAL HISTORY:   Amanda Sellers completed cycle 3 bendamustine/rituximab beginning 09/30/2014. She reports tolerating chemotherapy well. No palpable nodes. She was admitted on 10/15/2014 with atrial flutter and increased dyspnea and edema. She underwent cardioversion on 10/16/2014. She reports feeling better after the procedure, but continues to have orthopnea and exertional dyspnea. She was started on anticoagulation therapy.  Objective:  Vital signs in last 24 hours:  Blood pressure 132/89, pulse 113, temperature 97.8 F (36.6 C), temperature source Oral, resp. rate 18, height $RemoveBe'5\' 7"'[id number]$  (1.702 m), weight 172 lb 3.2 oz (78.109 kg), SpO2 99 %.    HEENT: No thrush or ulcers Lymphatics: No cervical or supraclavicular nodes Resp: Lungs clear bilaterally Cardio: Tachycardia, irregular GI: No hepatosplenomegaly, nontender Vascular: No leg edema   Lab Results:  Lab Results  Component Value Date   WBC 2.7* 10/27/2014   HGB 9.6* 10/27/2014   HCT 28.4* 10/27/2014   MCV 84.4 10/27/2014   PLT 139* 10/27/2014   NEUTROABS 1.5 10/27/2014      Lab Results  Component Value Date   CEA 0.8 07/23/2014    Imaging:  No results found.  Medications: I have reviewed the patient's current medications.  Assessment/Plan: 1. 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 Severe microcytic anemia, status post transfusion with packed red blood cells 07/23/2014   Normal ferritin, low  transferrin saturation, "scant" bone marrow iron stores, review of peripheral blood smear consistent with iron deficiency anemia   She has been referred for outpatient GI evaluation  Recent colonoscopy and upper endoscopy 3. Exertional dyspnea /orthopnea-most likely secondary to congestive heart failure  4. History of a B-12 deficiency  5. Rash over the trunk and extremities 08/18/2014. Appears consistent with a drug rash. Resolved 08/28/2014.  6. Colonoscopy 06/03/2008. Medium sized internal hemorrhoids.  7. Admission with atrial flutter with a rapid ventricular response 10/15/2014-status post cardioversion 10/16/2014, maintained on     apixaban    Disposition:  Amanda Sellers appears to be tolerating the bendamustine/rituximab well. The plan is to proceed with cycle 4 today. She appears to be in atrial fibrillation. We will  contact cardiology. She continues aspirin and apixaban. We will confirm that cardiology wants to discontinue the aspirin.  Amanda Sellers will return for an office visit and restaging CT evaluation in 3 weeks. She will contact us for bleeding or a fever.  [name], MD  10/27/2014  10:28 AM

## 2014-10-27 NOTE — Telephone Encounter (Signed)
Per Dr. Benay Spice; notified pt to stop aspirin per MD and during appt tomorrow; we will be checking her heart; getting EKG.  Pt verbalized understanding and expressed appreciation for call

## 2014-10-27 NOTE — Patient Instructions (Signed)
[facility] Discharge Instructions for Patients Receiving Chemotherapy  Today you received the following chemotherapy agents Rituxan/Bendeka To help prevent nausea and vomiting after your treatment, we encourage you to take your nausea medication as prescribed.   If you develop nausea and vomiting that is not controlled by your nausea medication, call the clinic.   BELOW ARE SYMPTOMS THAT SHOULD BE REPORTED IMMEDIATELY:  *FEVER GREATER THAN 100.5 F  *CHILLS WITH OR WITHOUT FEVER  NAUSEA AND VOMITING THAT IS NOT CONTROLLED WITH YOUR NAUSEA MEDICATION  *UNUSUAL SHORTNESS OF BREATH  *UNUSUAL BRUISING OR BLEEDING  TENDERNESS IN MOUTH AND THROAT WITH OR WITHOUT PRESENCE OF ULCERS  *URINARY PROBLEMS  *BOWEL PROBLEMS  UNUSUAL RASH Items with * indicate a potential emergency and should be followed up as soon as possible.  Feel free to call the clinic you have any questions or concerns. The clinic phone number is [phone].  Please show the CHEMO ALERT CARD at check-in to the Emergency Department and triage nurse.   

## 2014-10-27 NOTE — Telephone Encounter (Signed)
Encounter complete. 

## 2014-10-28 ENCOUNTER — Other Ambulatory Visit: Payer: Self-pay

## 2014-10-28 ENCOUNTER — Telehealth (HOSPITAL_COMMUNITY): Payer: Self-pay

## 2014-10-28 ENCOUNTER — Inpatient Hospital Stay (HOSPITAL_COMMUNITY): Admit: 2014-10-28 | Payer: Medicare HMO | Admitting: Nurse Practitioner

## 2014-10-28 ENCOUNTER — Ambulatory Visit (HOSPITAL_BASED_OUTPATIENT_CLINIC_OR_DEPARTMENT_OTHER): Payer: Medicare HMO

## 2014-10-28 VITALS — BP 122/69 | HR 87 | Temp 98.0°F | Resp 18

## 2014-10-28 DIAGNOSIS — C8290 Follicular lymphoma, unspecified, unspecified site: Secondary | ICD-10-CM

## 2014-10-28 DIAGNOSIS — Z5111 Encounter for antineoplastic chemotherapy: Secondary | ICD-10-CM

## 2014-10-28 MED ORDER — SODIUM CHLORIDE 0.9 % IV SOLN
70.0000 mg/m2 | Freq: Once | INTRAVENOUS | Status: AC
  Administered 2014-10-28: 125 mg via INTRAVENOUS
  Filled 2014-10-28: qty 5

## 2014-10-28 MED ORDER — SODIUM CHLORIDE 0.9 % IV SOLN
Freq: Once | INTRAVENOUS | Status: AC
  Administered 2014-10-28: 09:00:00 via INTRAVENOUS

## 2014-10-28 MED ORDER — SODIUM CHLORIDE 0.9 % IV SOLN
Freq: Once | INTRAVENOUS | Status: AC
  Administered 2014-10-28: 09:00:00 via INTRAVENOUS
  Filled 2014-10-28: qty 4

## 2014-10-28 NOTE — Telephone Encounter (Signed)
Encounter complete. 

## 2014-10-28 NOTE — Patient Instructions (Signed)
[facility] Discharge Instructions for Patients Receiving Chemotherapy  Today you received the following chemotherapy agents [place]a.   To help prevent nausea and vomiting after your treatment, we encourage you to take your nausea medication as directed.    If you develop nausea and vomiting that is not controlled by your nausea medication, call the clinic.   BELOW ARE SYMPTOMS THAT SHOULD BE REPORTED IMMEDIATELY:  *FEVER GREATER THAN 100.5 F  *CHILLS WITH OR WITHOUT FEVER  NAUSEA AND VOMITING THAT IS NOT CONTROLLED WITH YOUR NAUSEA MEDICATION  *UNUSUAL SHORTNESS OF BREATH  *UNUSUAL BRUISING OR BLEEDING  TENDERNESS IN MOUTH AND THROAT WITH OR WITHOUT PRESENCE OF ULCERS  *URINARY PROBLEMS  *BOWEL PROBLEMS  UNUSUAL RASH Items with * indicate a potential emergency and should be followed up as soon as possible.  Feel free to call the clinic you have any questions or concerns. The clinic phone number is [phone].  Please show the CHEMO ALERT CARD at check-in to the Emergency Department and triage nurse.   

## 2014-10-29 ENCOUNTER — Ambulatory Visit (HOSPITAL_COMMUNITY)
Admission: RE | Admit: 2014-10-29 | Discharge: 2014-10-29 | Disposition: A | Discharge status: Home / Self Care | Payer: Medicare HMO | Source: Ambulatory Visit | Attending: Internal Medicine | Admitting: Internal Medicine

## 2014-10-29 DIAGNOSIS — Z7901 Long term (current) use of anticoagulants: Secondary | ICD-10-CM

## 2014-10-29 DIAGNOSIS — R002 Palpitations: Secondary | ICD-10-CM | POA: Diagnosis not present

## 2014-10-29 DIAGNOSIS — R0602 Shortness of breath: Secondary | ICD-10-CM | POA: Insufficient documentation

## 2014-10-29 DIAGNOSIS — I10 Essential (primary) hypertension: Secondary | ICD-10-CM

## 2014-10-29 DIAGNOSIS — D509 Iron deficiency anemia, unspecified: Secondary | ICD-10-CM | POA: Diagnosis not present

## 2014-10-29 DIAGNOSIS — R9439 Abnormal result of other cardiovascular function study: Secondary | ICD-10-CM | POA: Insufficient documentation

## 2014-10-29 DIAGNOSIS — I480 Paroxysmal atrial fibrillation: Secondary | ICD-10-CM | POA: Diagnosis not present

## 2014-10-29 DIAGNOSIS — R0609 Other forms of dyspnea: Secondary | ICD-10-CM | POA: Diagnosis not present

## 2014-10-29 HISTORY — PX: NM MYOVIEW LTD: HXRAD82

## 2014-10-29 MED ORDER — TECHNETIUM TC 99M SESTAMIBI GENERIC - CARDIOLITE
32.6000 | Freq: Once | INTRAVENOUS | Status: AC | PRN
  Administered 2014-10-29: 32.6 via INTRAVENOUS

## 2014-10-29 MED ORDER — REGADENOSON 0.4 MG/5ML IV SOLN
0.4000 mg | Freq: Once | INTRAVENOUS | Status: AC
  Administered 2014-10-29: 0.4 mg via INTRAVENOUS

## 2014-10-29 MED ORDER — TECHNETIUM TC 99M SESTAMIBI GENERIC - CARDIOLITE
10.6000 | Freq: Once | INTRAVENOUS | Status: AC | PRN
  Administered 2014-10-29: 10.6 via INTRAVENOUS

## 2014-10-30 LAB — MYOCARDIAL PERFUSION IMAGING
Peak HR: 133 {beats}/min
Rest HR: 94 {beats}/min
SDS: 4
SRS: 2
SSS: 6
TID: 1.03

## 2014-11-02 ENCOUNTER — Ambulatory Visit (HOSPITAL_COMMUNITY)
Admission: RE | Admit: 2014-11-02 | Discharge: 2014-11-02 | Disposition: A | Discharge status: Home / Self Care | Payer: Medicare HMO | Source: Ambulatory Visit | Attending: Nurse Practitioner | Admitting: Nurse Practitioner

## 2014-11-02 VITALS — BP 132/82 | HR 140 | Ht 66.0 in | Wt 171.8 lb

## 2014-11-02 DIAGNOSIS — I4891 Unspecified atrial fibrillation: Secondary | ICD-10-CM

## 2014-11-02 MED ORDER — METOPROLOL SUCCINATE ER 25 MG PO TB24: ORAL_TABLET | ORAL | Status: DC

## 2014-11-02 NOTE — Patient Instructions (Signed)
Your physician has recommended you make the following change in your medication:  1)Metoprolol 25mg  -- take 1 tablet in the morning and 1/2 tablet in the evening.

## 2014-11-03 ENCOUNTER — Telehealth: Payer: Self-pay | Admitting: *Deleted

## 2014-11-03 NOTE — Telephone Encounter (Signed)
Spoke to patient's daughter. Result given . Verbalized understanding Aware to of patient's appointments 11/04/14- afib clinic  and 11/09/14- Dr  Ellyn Hack

## 2014-11-03 NOTE — Progress Notes (Signed)
Patient ID: Amanda Sellers, female   DOB: 1932-02-16, 81 y.o.   MRN: [id number]     Primary Care Physician: Tammi Sou, MD Referring Physician: Dr. Ronni Rumble Amanda Sellers is a 81 y.o. female with a h/o  diastolic heart failure. She is also getting Oncology Rx for Lymphoma. She had been doing well then last few weeks has had exertional dyspnea followed by PND & orhtopnea. Over the past ~2-3 days, she noticed worsening edema. She felt very tired & has no energy. She  noted a few palpitations, but does not recall rapid irregular heart rates/rhythms.She was found to be in a fib with RVR. She was admitted to [facility] 9/22 on IV dilt and given diuretics for acute on chronic systolic HF. It was arranged for to undergo TEE DCCV.  She had TEE without thrombus and underwent DCCV that was successful with one shock of 120J. She did well post procedure and was seen by Dr. Jerilynn Mages. Croitoru and found stable for discharge. She was negative 2700 since admit and weight is down 4 lbs with IV diuretics. D/C wt is 168.  Plan was for Mandatory uninterrupted anticoagulation for next 30 days, preferably lifelong barring bleeding complications. AFib clinic follow up in 1-2 weeks. Outpatient [facility], non-urgent. With follow up with Dr. Ellyn Hack post nuc. I will have her have a CBC on the 27th of Sept to recheck her HGB, is not seen by oncology until Oct.ASA stopped.   She is being seen in the afib clinic today.She went into afib short time after cardioversion. EKG shows afib at 130-140 bpm, although she is tolerating well. She is not on any drugs for rate control.Is taking DOAC without fail. Conitinues with chemotherapy for lymphoma.  Today, she denies symptoms of palpitations, chest pain, shortness of breath, orthopnea, PND, lower extremity edema, dizziness, presyncope, syncope, or neurologic sequela. The patient is tolerating medications without difficulties and is otherwise without complaint  today.   Past Medical History  Diagnosis Date  . Vitamin B 12 deficiency   . HTN (hypertension)   . Vitamin D deficiency   . HLD (hyperlipidemia)      06/2014  . Chronic diastolic congestive heart failure     Echo 07/23/14: mild LVH, EF 50-55%, grade 2 diast dysfxn  . Pulmonary metastases 07/24/2014  . Follicular lymphoma     Non Hodgkins B cell lymphoma;    Cycle 1 bendamustine/rituximab 08/04/2014  . Microcytic anemia     transfused 3 U total in hosp 07/2014  . Positive occult stool blood test 08/08/14    Dr. Watt Climes to do endoscopies as of 09/07/14  . Hypothyroidism   . Pancytopenia due to antineoplastic chemotherapy   . Hyperglycemia     on several BMETs but fasting status was unknown, so A1c was done and it was WELL wnl.  . Atrial fibrillation   . Shortness of breath dyspnea   . Anxiety   . Atrial flutter with rapid ventricular response,maintaining SR after TEE/DCCV 10/15/2014  . Anticoagulation adequate, Eliquis 10/16/2014  . Acute diastolic HF (heart failure) 10/16/2014   Past Surgical History  Procedure Laterality Date  . Breast biopsy    . Bone marrow biopsy  07/24/14  . Abdominal hysterectomy  1978  . Transthoracic echocardiogram  07/23/14    mild LVH, EF 50-55%, grade 2 diast dysfxn  . Upper gi endoscopy  09/23/2014    small hiatus hernia, nodules in stomach biopsied (chronic active erosive atrophic gastritis with intestinal metaplasia--no dysplasia  or malignancy) otherwise normal  . Colonoscopy  09/23/14    small hemorrhoids, otherwise normal (performed for IDA and heme+ stool)  . Tee without cardioversion N/A 10/16/2014    Procedure: TRANSESOPHAGEAL ECHOCARDIOGRAM (TEE);  Surgeon: Lelon Perla, MD;  Location: [facility] ENDOSCOPY;  Service: Cardiovascular;  Laterality: N/A;  . Cardioversion N/A 10/16/2014    Procedure: CARDIOVERSION;  Surgeon: Lelon Perla, MD;  Location: [facility] ENDOSCOPY;  Service: Cardiovascular;  Laterality: N/A;    Current Outpatient Prescriptions    Medication Sig Dispense Refill  . ALPRAZolam (XANAX) 0.5 MG tablet Take 1 tablet by mouth every 6 (six) hours as needed. anxiety    . apixaban (ELIQUIS) 5 MG TABS tablet Take 1 tablet (5 mg total) by mouth 2 (two) times daily. 60 tablet 0  . ergocalciferol (VITAMIN D2) 50000 UNITS capsule Take 50,000 Units by mouth once a week. On Sundays    . ferrous sulfate 325 (65 FE) MG tablet Take 1 tablet (325 mg total) by mouth 2 (two) times daily with a meal. 30 tablet 1  . fish oil-omega-3 fatty acids 1000 MG capsule Take 2 g by mouth daily.    . furosemide (LASIX) 40 MG tablet 1/2 tab po qd 30 tablet 2  . levothyroxine (SYNTHROID, LEVOTHROID) 100 MCG tablet Take 1 tablet (100 mcg total) by mouth daily before breakfast. 30 tablet 3  . lisinopril (PRINIVIL,ZESTRIL) 2.5 MG tablet Take 1 tablet (2.5 mg total) by mouth daily. 90 tablet 3  . OXYGEN Inhale into the lungs. 1Lt at bedtime    . pravastatin (PRAVACHOL) 80 MG tablet Take 80 mg by mouth daily.    Marland Kitchen PRESCRIPTION MEDICATION Chemo. Last dose was 10-02-14 at [facility].    Marland Kitchen prochlorperazine (COMPAZINE) 10 MG tablet Take 1 tablet by mouth 3 (three) times daily before meals.     . prochlorperazine (COMPAZINE) 5 MG tablet Take 1 tablet (5 mg total) by mouth every 6 (six) hours as needed for nausea or vomiting. 30 tablet 1  . metoprolol succinate (TOPROL XL) 25 MG 24 hr tablet Take 1 tablet in the AM and 1/2 tablet in the PM 45 tablet 1   No current facility-administered medications for this encounter.    No Known Allergies  Social History   Social History  . Marital Status: Married    Spouse Name: N/A  . Number of Children: 8  . Years of Education: N/A   Occupational History  . retired    Social History Main Topics  . Smoking status: Never Smoker   . Smokeless tobacco: Never Used  . Alcohol Use: No  . Drug Use: No  . Sexual Activity: Not on file   Other Topics Concern  . Not on file   Social History Narrative   Widowed, has 8 children.    Orig from [place], now lives in [place].   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   No tob/alc/drugs.    Family History  Problem Relation Age of Onset  . Melanoma Mother   . Stomach cancer Father   . Lymphoma Sister   . Diabetes Daughter     ROS- All systems are reviewed and negative except as per the HPI above  Physical Exam: Filed Vitals:   11/02/14 0956  BP: 132/82  Pulse: 140  Height: 5' 6" (1.676 m)  Weight: 171 lb 12.8 oz (77.928 kg)    GEN- The patient is well appearing, alert and oriented x  3 today.   Head- normocephalic, atraumatic Eyes-  Sclera clear, conjunctiva pink Ears- hearing intact Oropharynx- clear Neck- supple, no JVP Lymph- no cervical lymphadenopathy Lungs- Clear to ausculation bilaterally, normal work of breathing Heart- Rapid, irregular rate and rhythm, no murmurs, rubs or gallops, PMI not laterally displaced GI- soft, NT, ND, + BS Extremities- no clubbing, cyanosis, or edema MS- no significant deformity or atrophy Skin- no rash or lesion Psych- euthymic mood, full affect Neuro- strength and sensation are intact  EKG- Afib with rvr at 130-140 bpm, pr int 188 ms, qrs int 86 ms, qtc int 506 ms. Epic records reviewed.  Assessment and Plan: 1. afib Add metoprolol 25 mg qd for rate control and will see back in 3 days and will probably need uptitration of BB. Continue apixaban for chadsvasc score of at least 4. If after finishes chemo, may discuss AAD's.   F/u afib clinic on Thursday.  Geroge Baseman Elliana Bal, [facility] [address] [place], [place] [address] [phone]

## 2014-11-03 NOTE — Telephone Encounter (Signed)
-----   Message from Isaiah Serge, NP sent at 11/03/2014  3:30 PM EDT ----- Stress test low risk, follow up with Dr. Ellyn Hack next week -she has appt.  Please let pt know.  thanks

## 2014-11-04 ENCOUNTER — Encounter (HOSPITAL_COMMUNITY): Payer: Self-pay | Admitting: Nurse Practitioner

## 2014-11-04 ENCOUNTER — Ambulatory Visit (HOSPITAL_COMMUNITY)
Admission: RE | Admit: 2014-11-04 | Discharge: 2014-11-04 | Disposition: A | Discharge status: Home / Self Care | Payer: Medicare HMO | Source: Ambulatory Visit | Attending: Nurse Practitioner | Admitting: Nurse Practitioner

## 2014-11-04 VITALS — BP 118/70 | HR 102 | Ht 66.0 in | Wt 173.4 lb

## 2014-11-04 DIAGNOSIS — I4891 Unspecified atrial fibrillation: Secondary | ICD-10-CM | POA: Diagnosis present

## 2014-11-04 MED ORDER — METOPROLOL SUCCINATE ER 25 MG PO TB24: 25.0000 mg | ORAL_TABLET | Freq: Two times a day (BID) | ORAL | Status: DC

## 2014-11-04 NOTE — Patient Instructions (Signed)
Your physician has recommended you make the following change in your medication:  1) Increase metoprolol to 25mg  twice daily

## 2014-11-04 NOTE — Progress Notes (Signed)
Patient ID: Amanda Sellers, female   DOB: [birth year], 81 y.o.   MRN: [id number]     Primary Care Physician: Amanda Sou, MD Referring Physician: Dr. Ronni Sellers Amanda Sellers is a 81 y.o. female with a h/o  diastolic heart failure. She is also getting Oncology Rx for Lymphoma. She had been doing well then last few weeks has had exertional dyspnea followed by PND & orhtopnea. Over the past ~2-3 days, she noticed worsening edema. She felt very tired & has no energy. She  noted a few palpitations, but does not recall rapid irregular heart rates/rhythms.She was found to be in a fib with RVR. She was admitted to [facility] 9/22 on IV dilt and given diuretics for acute on chronic systolic HF. It was arranged for to undergo TEE DCCV.  She had TEE without thrombus and underwent DCCV that was successful with one shock of 120J. She did well post procedure and was seen by Dr. Jerilynn Mages. Sellers and found stable for discharge. She was negative 2700 since admit and weight is down 4 lbs with IV diuretics. D/C wt is 168.  Plan was for Mandatory uninterrupted anticoagulation for next 30 days, preferably lifelong barring bleeding complications. AFib clinic follow up in 1-2 weeks. Outpatient [facility], non-urgent. .ASA stopped.   Shewas seen in the afib clinic and found to be in afib with rvr..She went into afib short time after cardioversion. EKG shows afib at 130-140 bpm, although she is tolerating well. She is not on any drugs for rate control.Is taking DOAC without fail. Conitinues with chemotherapy for lymphoma.   She was started on BB and now returns with better v rates. 80's with sitting and 90-100's with activity. She does not have nay PND/orthopnea although she has slept better in her recliner for months now.No lower extremity edema. Her weight is stable at home.  Today, she denies symptoms of palpitations, chest pain,  orthopnea, PND, lower extremity edema, dizziness, presyncope, syncope, or  neurologic sequela. Fatigue and mild dyspnea. The patient is tolerating medications without difficulties and is otherwise without complaint today.   Past Medical History  Diagnosis Date  . Vitamin B 12 deficiency   . HTN (hypertension)   . Vitamin D deficiency   . HLD (hyperlipidemia)      06/2014  . Chronic diastolic congestive heart failure ([place])     Echo 07/23/14: mild LVH, EF 50-55%, grade 2 diast dysfxn  . Pulmonary metastases ([place]) 07/24/2014  . Follicular lymphoma ([place])     Non Hodgkins B cell lymphoma;    Cycle 1 bendamustine/rituximab 08/04/2014  . Microcytic anemia     transfused 3 U total in hosp 07/2014  . Positive occult stool blood test 08/08/14    Amanda Sellers to do endoscopies as of 09/07/14  . Hypothyroidism   . Pancytopenia due to antineoplastic chemotherapy ([place])   . Hyperglycemia     on several BMETs but fasting status was unknown, so A1c was done and it was WELL wnl.  . Atrial fibrillation ([place])   . Shortness of breath dyspnea   . Anxiety   . Atrial flutter with rapid ventricular response,maintaining SR after TEE/DCCV 10/15/2014  . Anticoagulation adequate, Eliquis 10/16/2014  . Acute diastolic HF (heart failure) ([place]) 10/16/2014   Past Surgical History  Procedure Laterality Date  . Breast biopsy    . Bone marrow biopsy  07/24/14  . Abdominal hysterectomy  1978  . Transthoracic echocardiogram  07/23/14    mild LVH, EF 50-55%,  grade 2 diast dysfxn  . Upper gi endoscopy  09/23/2014    small hiatus hernia, nodules in stomach biopsied (chronic active erosive atrophic gastritis with intestinal metaplasia--no dysplasia or malignancy) otherwise normal  . Colonoscopy  09/23/14    small hemorrhoids, otherwise normal (performed for IDA and heme+ stool)  . Tee without cardioversion N/A 10/16/2014    Procedure: TRANSESOPHAGEAL ECHOCARDIOGRAM (TEE);  Surgeon: Amanda Perla, MD;  Location: [facility] ENDOSCOPY;  Service: Cardiovascular;  Laterality: N/A;  . Cardioversion N/A 10/16/2014     Procedure: CARDIOVERSION;  Surgeon: Amanda Perla, MD;  Location: [facility] ENDOSCOPY;  Service: Cardiovascular;  Laterality: N/A;    Current Outpatient Prescriptions  Medication Sig Dispense Refill  . ALPRAZolam (XANAX) 0.5 MG tablet Take 1 tablet by mouth every 6 (six) hours as needed. anxiety    . apixaban (ELIQUIS) 5 MG TABS tablet Take 1 tablet (5 mg total) by mouth 2 (two) times daily. 60 tablet 0  . ergocalciferol (VITAMIN D2) 50000 UNITS capsule Take 50,000 Units by mouth once a week. On Sundays    . ferrous sulfate 325 (65 FE) MG tablet Take 1 tablet (325 mg total) by mouth 2 (two) times daily with a meal. 30 tablet 1  . fish oil-omega-3 fatty acids 1000 MG capsule Take 2 g by mouth daily.    . furosemide (LASIX) 40 MG tablet 1/2 tab po qd 30 tablet 2  . levothyroxine (SYNTHROID, LEVOTHROID) 100 MCG tablet Take 1 tablet (100 mcg total) by mouth daily before breakfast. 30 tablet 3  . lisinopril (PRINIVIL,ZESTRIL) 2.5 MG tablet Take 1 tablet (2.5 mg total) by mouth daily. 90 tablet 3  . metoprolol succinate (TOPROL XL) 25 MG 24 hr tablet Take 1 tablet (25 mg total) by mouth 2 (two) times daily. 60 tablet 2  . OXYGEN Inhale into the lungs. 1Lt at bedtime    . pravastatin (PRAVACHOL) 80 MG tablet Take 80 mg by mouth daily.    Marland Kitchen PRESCRIPTION MEDICATION Chemo. Last dose was 10-02-14 at [facility].    Marland Kitchen prochlorperazine (COMPAZINE) 10 MG tablet Take 1 tablet by mouth 3 (three) times daily before meals.     . prochlorperazine (COMPAZINE) 5 MG tablet Take 1 tablet (5 mg total) by mouth every 6 (six) hours as needed for nausea or vomiting. 30 tablet 1   No current facility-administered medications for this encounter.    No Known Allergies  Social History   Social History  . Marital Status: Married    Spouse Name: N/A  . Number of Children: 8  . Years of Education: N/A   Occupational History  . retired    Social History Main Topics  . Smoking status: Never Smoker   . Smokeless tobacco: Never  Used  . Alcohol Use: No  . Drug Use: No  . Sexual Activity: Not on file   Other Topics Concern  . Not on file   Social History Narrative   Widowed, has 8 children.   Orig from [place], now lives in [place].   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   No tob/alc/drugs.    Family History  Problem Relation Age of Onset  . Melanoma Mother   . Stomach cancer Father   . Lymphoma Sister   . Diabetes Daughter     ROS- All systems are reviewed and negative except as per the HPI above  Physical Exam: Filed Vitals:   11/04/14 1028  BP: 118/70  Pulse: 102  Height: '5\' 6"'  (1.676 m)  Weight: 173 lb 6.4 oz (78.654 kg)    GEN- The patient is well appearing, alert and oriented x 3 today.   Head- normocephalic, atraumatic Eyes-  Sclera clear, conjunctiva pink Ears- hearing intact Oropharynx- clear Neck- supple, no JVP Lymph- no cervical lymphadenopathy Lungs- Clear to ausculation bilaterally, normal work of breathing Heart- Rapid, irregular rate and rhythm, no murmurs, rubs or gallops, PMI not laterally displaced GI- soft, NT, ND, + BS Extremities- no clubbing, cyanosis, or edema MS- no significant deformity or atrophy Skin- no rash or lesion Psych- euthymic mood, full affect Neuro- strength and sensation are intact  EKG- Afib with rvr at 102 bpm with aberrantly conducted beats or pvc's. Qrs int 84 ms, Qrs 84 sm, Qtc 461 ms. Epic records reviewed.  Assessment and Plan: 1. afib Increase metoprolol 25 mg bid for better rate control  Continue apixaban for chadsvasc score of at least 4. She has a pet scan on 10/25 and f/u with oncologist 10/26 and will found out if cancer is in remission or if she needs more chemotherapy. I  would like to initiate antiarrythmic's to restore SR but hesitate to start antiarrythmic's at this time until knowing what her cancer status/ course of treatment will be. Continue to watch daily weights and if gains  fluid or becomes short of breath notify office immediately   F/u with Dr. Ellyn Hack 10/17 F/u afib clinic 10/27  Geroge Baseman. Britnee Mcdevitt, [facility] [address] [place], [place] [address] [phone]

## 2014-11-09 ENCOUNTER — Encounter: Payer: Self-pay | Admitting: Cardiology

## 2014-11-09 ENCOUNTER — Ambulatory Visit (INDEPENDENT_AMBULATORY_CARE_PROVIDER_SITE_OTHER): Payer: Medicare HMO | Admitting: Cardiology

## 2014-11-09 VITALS — BP 112/74 | HR 106 | Ht 66.0 in | Wt 171.0 lb

## 2014-11-09 DIAGNOSIS — Z7901 Long term (current) use of anticoagulants: Secondary | ICD-10-CM

## 2014-11-09 DIAGNOSIS — I4891 Unspecified atrial fibrillation: Secondary | ICD-10-CM | POA: Diagnosis not present

## 2014-11-09 DIAGNOSIS — I10 Essential (primary) hypertension: Secondary | ICD-10-CM | POA: Diagnosis not present

## 2014-11-09 DIAGNOSIS — I5032 Chronic diastolic (congestive) heart failure: Secondary | ICD-10-CM

## 2014-11-09 DIAGNOSIS — R0600 Dyspnea, unspecified: Secondary | ICD-10-CM | POA: Diagnosis not present

## 2014-11-09 DIAGNOSIS — E785 Hyperlipidemia, unspecified: Secondary | ICD-10-CM

## 2014-11-09 MED ORDER — RIVAROXABAN 20 MG PO TABS: 20.0000 mg | ORAL_TABLET | Freq: Every day | ORAL | Status: DC

## 2014-11-09 MED ORDER — APIXABAN 5 MG PO TABS: 5.0000 mg | ORAL_TABLET | Freq: Two times a day (BID) | ORAL | Status: DC

## 2014-11-09 MED ORDER — METOPROLOL SUCCINATE ER 50 MG PO TB24: 50.0000 mg | ORAL_TABLET | Freq: Two times a day (BID) | ORAL | Status: DC

## 2014-11-09 MED ORDER — FUROSEMIDE 40 MG PO TABS: ORAL_TABLET | ORAL | Status: DC

## 2014-11-09 NOTE — Patient Instructions (Addendum)
STOP LISINOPRIL  TODAY - TAKE AN EXTRA 25 MG WHEN YOU GET HOME , THIS EVENING TAKE 2 TABLET OF METOPROLOL THAT EQUAL 50 MG.    CHANGE TO METOPROLOL SUCC. 50 MG TWICE  A DAY BY MOUTH.  TODAY - TAKE 40 MG  OF LASIX ( FUROSEMIDE)  TOMORROW TAKE 40 MG IN THE MORNING  AND 20 MG INT EVENING.   STARTING Wednesday  11/11/14--- TAKING 40 MG BY MOUTH DAILY.  NEXT Friday 11/13/14- HAVE LAB WORK DONE- [facility]  ANYTIME OF THE DAY.   IN ONE MONTH CAN CHANGE IN [facility]  Your physician recommends that you schedule a follow-up appointment in JAN 2017 WITH DR Dinosaur - 30 MIN

## 2014-11-09 NOTE — Progress Notes (Signed)
PCP: Tammi Sou, MD  Clinic Note: Chief Complaint  Patient presents with  . post hospital f/u/nuc  . Atrial Fibrillation  . Congestive Heart Failure    HPI: Amanda Sellers is a 81 y.o. female with a PMH below who presents today for follow-up for PAF (Afib RVR) with HFPF. Her A. Fib as in initially diagnosed in the setting of undergoing treatment for lymphoma. She was initially on a high-dose ACE inhibitor and Lasix following hospitalization for heart failure. I reduced these doses significantly on initial visit.  Dreyah Montrose Ashbaugh was last seen in my clinic on 10/15/14 --> was in Afib RVR with HFPF, admitted for diuresis & TEE/DCCV  Successful TEE/TCC B. On 11-13-2022  Discharged with planned an interrupted anticoagulation for 30 days. Was on Eliquis.  Discharge weight 168 pounds (after diuresing 2700 mL - 4 pound weight loss)  Studies Reviewed:   TEE -- DCCV: 13-Nov-2014 Normal LV function - EF 55-60%; biatrial enlargment; spontaneous contrast in LAA but no thrombus; mild AI; moderate MR (2+); mild TR. -- Successful DCCV - 1 shock 120 J.  Myoview 10/29/2014:medium size mild surgery defect in the mid anterior and apical anterior location suggestive of breast attenuation. No reversibility. LOW RISK  Following her discharge, she was seen twice in the Atrial Fibrillation Clinic: on October 10, she was noted to be back in A. Fib with RVR, tolerating relatively well --> started on Toprol 25 mg: 1 tab every morning and 1 tab every afternoon. --> followup visit 2 days later demonstrated improved rate control in the 80s to 100s. No noted heart failure symptoms. Stable weight.aspirin was stopped. --> metoprolol increased to 25 mg twice a day.  Future plan to consider initiating antiarrhythmic agent once we are sure of her cancer status.  Interval History: she presents today really feeling okay, but has noted more notable PND and exertional dyspnea.  Denies really having orthopnea, but  wakes up @ night feeling SOB. Mild posterior medial and abdominal fullness. No sensation of palpitations or rapid heart beats to suggest that she knows that she is in a bed or going fast while in A. Fib.  No chest pain /pressure with rest or exertion.  No palpitations, lightheadedness, dizziness, weakness or syncope/near syncope. No TIA/amaurosis fugax symptoms. No melena, hematochezia, hematuria, or epstaxis. No claudication.  ROS: A comprehensive was performed. Review of Systems  Constitutional: Positive for malaise/fatigue.  Respiratory: Positive for cough (As noted coughing in the morning that is new) and shortness of breath.   Gastrointestinal: Negative for heartburn.  Musculoskeletal: Positive for joint pain (standard arthritis pain).  Neurological: Positive for dizziness (Positional) and weakness (Generalized). Negative for focal weakness.  Endo/Heme/Allergies: Bruises/bleeds easily.  Psychiatric/Behavioral: The patient has insomnia (Wakes up in the middle of the night and has a hard time going back to sleep).   All other systems reviewed and are negative.  Past Medical History  Diagnosis Date  . Vitamin B 12 deficiency   . HTN (hypertension)   . Vitamin D deficiency   . HLD (hyperlipidemia)      06/2014  . Chronic diastolic congestive heart failure ([place])     Echo 07/23/14: mild LVH, EF 50-55%, grade 2 diast dysfxn  . Pulmonary metastases ([place]) 07/24/2014  . Follicular lymphoma ([place])     Non Hodgkins B cell lymphoma;    Cycle 1 bendamustine/rituximab 08/04/2014  . Microcytic anemia     transfused 3 U total in hosp 07/2014  . Positive occult stool blood  test 08/08/14    Dr. Watt Climes to do endoscopies as of 09/07/14  . Hypothyroidism   . Pancytopenia due to antineoplastic chemotherapy ([place])   . Atrial fibrillation ([place])     With RVR  . Anticoagulation adequate, Eliquis 10/16/2014  . Chronic diastolic congestive heart failure ([place]) 5/62/1308    Complicated by atrial fibrillation    . Anxiety     Past Surgical History  Procedure Laterality Date  . Breast biopsy    . Bone marrow biopsy  07/24/14  . Abdominal hysterectomy  1978  . Transthoracic echocardiogram  07/23/14    mild LVH, EF 50-55%, grade 2 diast dysfxn  . Upper gi endoscopy  09/23/2014    small hiatus hernia, nodules in stomach biopsied (chronic active erosive atrophic gastritis with intestinal metaplasia--no dysplasia or malignancy) otherwise normal  . Colonoscopy  09/23/14    small hemorrhoids, otherwise normal (performed for IDA and heme+ stool)  . Tee without cardioversion N/A 10/16/2014    Procedure: TRANSESOPHAGEAL ECHOCARDIOGRAM (TEE);  Surgeon: Lelon Perla, MD;  Location: [facility] [facility];  Service: Cardiovascular;  Laterality: N/A;  . Cardioversion N/A 10/16/2014    Procedure: CARDIOVERSION;  Surgeon: Lelon Perla, MD;  Location: [facility] ENDOSCOPY;  Service: Cardiovascular;  Laterality: N/A;  . Nm myoview ltd  10/29/2014    medium size mild surgery defect in the mid anterior and apical anterior location suggestive of breast attenuation. No reversibility. LOW RISK    Prior to Admission medications   Medication Sig Start Date End Date Taking? Authorizing Provider  ALPRAZolam Duanne Moron) 0.5 MG tablet Take 1 tablet by mouth every 6 (six) hours as needed. anxiety 08/04/14  Yes Historical Provider, MD  apixaban (ELIQUIS) 5 MG TABS tablet Take 1 tablet (5 mg total) by mouth 2 (two) times daily. 10/16/14  Yes Isaiah Serge, NP  ergocalciferol (VITAMIN D2) 50000 UNITS capsule Take 50,000 Units by mouth once a week. On Sundays   Yes Historical Provider, MD  ferrous sulfate 325 (65 FE) MG tablet Take 1 tablet (325 mg total) by mouth 2 (two) times daily with a meal. 07/29/14  Yes Ladell Pier, MD  fish oil-omega-3 fatty acids 1000 MG capsule Take 2 g by mouth daily.   Yes Historical Provider, MD  furosemide (LASIX) 40 MG tablet 1/2 tab po qd 08/12/14  Yes Tammi Sou, MD  levothyroxine (SYNTHROID, LEVOTHROID)  100 MCG tablet Take 1 tablet (100 mcg total) by mouth daily before breakfast. 09/10/14  Yes Tammi Sou, MD  lisinopril (PRINIVIL,ZESTRIL) 2.5 MG tablet Take 1 tablet (2.5 mg total) by mouth daily. 08/17/14  Yes Leonie Man, MD  metoprolol succinate (TOPROL XL) 25 MG 24 hr tablet Take 1 tablet (25 mg total) by mouth 2 (two) times daily. 11/04/14  Yes Sherran Needs, NP  OXYGEN Inhale into the lungs. 1Lt at bedtime   Yes Historical Provider, MD  pravastatin (PRAVACHOL) 80 MG tablet Take 80 mg by mouth daily.   Yes Historical Provider, MD  PRESCRIPTION MEDICATION Chemo. Last dose was 10-02-14 at [facility].   Yes Historical Provider, MD  prochlorperazine (COMPAZINE) 10 MG tablet Take 1 tablet by mouth 3 (three) times daily before meals.  08/04/14  Yes Historical Provider, MD  prochlorperazine (COMPAZINE) 5 MG tablet Take 1 tablet (5 mg total) by mouth every 6 (six) hours as needed for nausea or vomiting. 08/04/14  Yes Ladell Pier, MD   No Known Allergies   Social History   Social History  .  Marital Status: Married    Spouse Name: N/A  . Number of Children: 8  . Years of Education: N/A   Occupational History  . retired    Social History Main Topics  . Smoking status: Never Smoker   . Smokeless tobacco: Never Used  . Alcohol Use: No  . Drug Use: No  . Sexual Activity: Not Asked   Other Topics Concern  . None   Social History Narrative   Widowed, has 8 children.   Orig from [place], now lives in [place].   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   No tob/alc/drugs.   Family History  Problem Relation Age of Onset  . Melanoma Mother   . Stomach cancer Father   . Lymphoma Sister   . Diabetes Daughter      Wt Readings from Last 3 Encounters:  11/09/14 171 lb (77.565 kg)  11/04/14 173 lb 6.4 oz (78.654 kg)  11/02/14 171 lb 12.8 oz (77.928 kg)    PHYSICAL EXAM BP 112/74 mmHg  Pulse 106  Ht _0  (1.676 m)  Wt 171 lb (77.565 kg)   BMI 27.61 kg/m2 General appearance: alert, cooperative, appears stated age, milddistress and concerned.  Neck: no adenopathy, no carotid bruit and mildly elevated JVD we cannon A waves Lungs: normal percussion bilaterally and Mild, diffuse interstitial sounds bilaterally. Nonlabored. Good air movement. Heart: Irreg-Irreg rapid rate with ectopy as well. Normal S1 & S2. Nondisplaced PMI. Salt 1-2/6 SEM. No R./G. Abdomen: soft, non-tender; bowel sounds normal; no masses, no organomegaly Extremities: 2-+ edema bilaterally distal leg to ankles Pulses: 2+ and symmetric Skin: Skin color, texture, turgor normal. No lesions.  Neurologic/Psych : Alert and oriented X 3, normal strength and tone. Normal symmetric reflexes. Normal coordination and gait. CN II-XII grossly intact. Pleasant mood and affect..    Adult ECG Report  Rate: 106 ;  Rhythm: atrial fibrillation, premature ventricular contractions (PVC) and Cannot exclude anterior infarct, age undetermined (poor precordial R wave.). Otherwise normal axis, intervals and durations.;   Narrative Interpretation: no notable change   Other studies Reviewed: Additional studies/ records that were reviewed today include:  Recent Labs:   Reviewed in Epic -- H/H 9.6/28.4.  T Protein low @ 6.2, K+ 3.6  ASSESSMENT / PLAN: Problem List Items Addressed This Visit    Hyperlipidemia   Relevant Medications   furosemide (LASIX) 40 MG tablet   metoprolol succinate (TOPROL-XL) 50 MG 24 hr tablet   apixaban (ELIQUIS) 5 MG TABS tablet   Other Relevant Orders   EKG 12-Lead (Completed)   Basic metabolic panel   Essential hypertension (Chronic)    Borderline blood pressures today. Close to being hypotensive. As I am increasing her beta blocker, plan is to DC ACE inhibitor. Once she is on effective antiarrhythmic agent and we were able to reduce beta blocker dosing, weekend and consider restarting ACE inhibitor or ARB.      Relevant Medications   furosemide  (LASIX) 40 MG tablet   metoprolol succinate (TOPROL-XL) 50 MG 24 hr tablet   apixaban (ELIQUIS) 5 MG TABS tablet   Other Relevant Orders   EKG 12-Lead (Completed)   Basic metabolic panel   Dyspnea    Related to A. Fib with RVR and at HFPEF      Relevant Orders   EKG 12-Lead (Completed)   Basic metabolic panel   Chronic diastolic CHF (congestive heart failure), NYHA class 2 (HCC) (Chronic)  She is now having signs and symptoms of exacerbation secondary to A. Fib RVR. I previously back off on her Lasix. Plan is for her to increase Lasix to 40 mg in the morning and additional 20 mg in the evening for the next several days the labs are checked. We'll then likely reduced to 40 mg daily.  Plan: Increase Lasix dose as described. Check BMP on Friday 10/21      Relevant Medications   furosemide (LASIX) 40 MG tablet   metoprolol succinate (TOPROL-XL) 50 MG 24 hr tablet   apixaban (ELIQUIS) 5 MG TABS tablet   Other Relevant Orders   EKG 12-Lead (Completed)   Basic metabolic panel   Atrial fibrillation with RVR ([place]) - Primary    Unfortunately, she is still not adequately rate controlled. Plan is to increase to 50 twice a day metoprolol. Currently written as metoprolol succinate. I will deferred to the A. Fib clinic for choice of medication, will consider switching to metoprolol for treatment. Plan his followup and A. Fib clinic to consider antiarrhythmic agent as she has been unable to maintain sinus rhythm.      Relevant Medications   furosemide (LASIX) 40 MG tablet   metoprolol succinate (TOPROL-XL) 50 MG 24 hr tablet   apixaban (ELIQUIS) 5 MG TABS tablet   Other Relevant Orders   EKG 12-Lead (Completed)   Basic metabolic panel   Anticoagulation adequate, Eliquis    Tolerating well. Apparently she was found to Xarelto is the preferred NOAC agent on her insurance. Once she has completed the first month of unerupted anticoagulation with Eliquis, we can then convert her over to his  Xarelto for maintenance.         Current medicines are reviewed at length with the patient today. (+/- concerns) More short of breath The following changes have been made:  STOP LISINOPRIL  TODAY - TAKE AN EXTRA METOPROLOL 25 MG WHEN YOU GET HOME , THIS EVENING TAKE 2 TABLET OF METOPROLOL THAT EQUAL 50 MG.    CHANGE TO METOPROLOL SUCC. 50 MG TWICE  A DAY BY MOUTH.  TODAY - TAKE 40 MG  OF LASIX ( FUROSEMIDE)  TOMORROW TAKE 40 MG IN THE MORNING  AND 20 MG INT EVENING.   STARTING Wednesday  11/11/14--- TAKING 40 MG BY MOUTH DAILY.  NEXT Friday 11/13/14- HAVE LAB WORK DONE- BMP  ANYTIME OF THE DAY.   IN ONE MONTH CAN CHANGE IN [place]   ROV: Jan 2017; Has f/u in Afib Clinic 10/27 --> plan is to consider antiarrythmic agent.  Can also convert to Xarelto (preferred by insurance plan)  Studies Ordered:   Orders Placed This Encounter  Procedures  . Basic metabolic panel  . EKG 12-Lead      Leonie Man, M.D., M.S. Interventional Cardiologist   Pager # [phone]

## 2014-11-11 ENCOUNTER — Encounter: Payer: Self-pay | Admitting: Cardiology

## 2014-11-11 NOTE — Assessment & Plan Note (Signed)
She is now having signs and symptoms of exacerbation secondary to A. Fib RVR. I previously back off on her Lasix. Plan is for her to increase Lasix to 40 mg in the morning and additional 20 mg in the evening for the next several days the labs are checked. We'll then likely reduced to 40 mg daily.  Plan: Increase Lasix dose as described. Check BMP on Friday 10/21

## 2014-11-11 NOTE — Assessment & Plan Note (Signed)
Related to A. Fib with RVR and at HFPEF

## 2014-11-11 NOTE — Assessment & Plan Note (Signed)
Unfortunately, she is still not adequately rate controlled. Plan is to increase to 50 twice a day metoprolol. Currently written as metoprolol succinate. I will deferred to the A. Fib clinic for choice of medication, will consider switching to metoprolol for treatment. Plan his followup and A. Fib clinic to consider antiarrhythmic agent as she has been unable to maintain sinus rhythm.

## 2014-11-11 NOTE — Assessment & Plan Note (Signed)
Tolerating well. Apparently she was found to Xarelto is the preferred NOAC agent on her insurance. Once she has completed the first month of unerupted anticoagulation with Eliquis, we can then convert her over to his Xarelto for maintenance.

## 2014-11-11 NOTE — Assessment & Plan Note (Signed)
Borderline blood pressures today. Close to being hypotensive. As I am increasing her beta blocker, plan is to DC ACE inhibitor. Once she is on effective antiarrhythmic agent and we were able to reduce beta blocker dosing, weekend and consider restarting ACE inhibitor or ARB.

## 2014-11-14 LAB — BASIC METABOLIC PANEL
BUN: 14 mg/dL (ref 7–25)
CO2: 32 mmol/L — ABNORMAL HIGH (ref 20–31)
Calcium: 9 mg/dL (ref 8.6–10.4)
Chloride: 103 mmol/L (ref 98–110)
Creat: 0.72 mg/dL (ref 0.60–0.88)
Glucose, Bld: 104 mg/dL — ABNORMAL HIGH (ref 65–99)
Potassium: 3.9 mmol/L (ref 3.5–5.3)
Sodium: 142 mmol/L (ref 135–146)

## 2014-11-16 ENCOUNTER — Encounter: Payer: Self-pay | Admitting: Oncology

## 2014-11-16 NOTE — Progress Notes (Signed)
Spoke with Jenny Reichmann, Ms. Vanderhoff's daughter regarding medical notes to be fax to Ms.Wilkerson's  cancer policy. Faxed notes to [phone] number given by [facility].

## 2014-11-17 ENCOUNTER — Encounter (HOSPITAL_COMMUNITY): Payer: Self-pay

## 2014-11-17 ENCOUNTER — Other Ambulatory Visit (HOSPITAL_BASED_OUTPATIENT_CLINIC_OR_DEPARTMENT_OTHER): Payer: Medicare HMO

## 2014-11-17 ENCOUNTER — Telehealth: Payer: Self-pay | Admitting: *Deleted

## 2014-11-17 ENCOUNTER — Ambulatory Visit (HOSPITAL_COMMUNITY)
Admission: RE | Admit: 2014-11-17 | Discharge: 2014-11-17 | Disposition: A | Discharge status: Home / Self Care | Payer: Medicare HMO | Source: Ambulatory Visit | Attending: Oncology | Admitting: Oncology

## 2014-11-17 DIAGNOSIS — J90 Pleural effusion, not elsewhere classified: Secondary | ICD-10-CM | POA: Diagnosis not present

## 2014-11-17 DIAGNOSIS — C8290 Follicular lymphoma, unspecified, unspecified site: Secondary | ICD-10-CM

## 2014-11-17 DIAGNOSIS — M4854XS Collapsed vertebra, not elsewhere classified, thoracic region, sequela of fracture: Secondary | ICD-10-CM | POA: Diagnosis not present

## 2014-11-17 DIAGNOSIS — I7090 Unspecified atherosclerosis: Secondary | ICD-10-CM | POA: Diagnosis not present

## 2014-11-17 DIAGNOSIS — J9811 Atelectasis: Secondary | ICD-10-CM | POA: Diagnosis not present

## 2014-11-17 DIAGNOSIS — Z08 Encounter for follow-up examination after completed treatment for malignant neoplasm: Secondary | ICD-10-CM | POA: Insufficient documentation

## 2014-11-17 DIAGNOSIS — Z9221 Personal history of antineoplastic chemotherapy: Secondary | ICD-10-CM | POA: Insufficient documentation

## 2014-11-17 LAB — COMPREHENSIVE METABOLIC PANEL (CC13)
ALT: 30 U/L (ref 0–55)
AST: 32 U/L (ref 5–34)
Albumin: 3.5 g/dL (ref 3.5–5.0)
Alkaline Phosphatase: 70 U/L (ref 40–150)
Anion Gap: 7 mEq/L (ref 3–11)
BUN: 16.3 mg/dL (ref 7.0–26.0)
CO2: 30 mEq/L — ABNORMAL HIGH (ref 22–29)
Calcium: 8.9 mg/dL (ref 8.4–10.4)
Chloride: 106 mEq/L (ref 98–109)
Creatinine: 0.7 mg/dL (ref 0.6–1.1)
EGFR: 82 mL/min/{1.73_m2} — ABNORMAL LOW (ref >90)
Glucose: 117 mg/dl (ref 70–140)
Potassium: 3.6 mEq/L (ref 3.5–5.1)
Sodium: 143 mEq/L (ref 136–145)
Total Bilirubin: 0.55 mg/dL (ref 0.20–1.20)
Total Protein: 6.1 g/dL — ABNORMAL LOW (ref 6.4–8.3)

## 2014-11-17 LAB — CBC WITH DIFFERENTIAL/PLATELET
BASO%: 1.4 % (ref 0.0–2.0)
Basophils Absolute: 0 10*3/uL (ref 0.0–0.1)
EOS%: 3.3 % (ref 0.0–7.0)
Eosinophils Absolute: 0.1 10*3/uL (ref 0.0–0.5)
HCT: 30.3 % — ABNORMAL LOW (ref 34.8–46.6)
HGB: 9.9 g/dL — ABNORMAL LOW (ref 11.6–15.9)
LYMPH%: 20.4 % (ref 14.0–49.7)
MCH: 28.2 pg (ref 25.1–34.0)
MCHC: 32.8 g/dL (ref 31.5–36.0)
MCV: 85.9 fL (ref 79.5–101.0)
MONO#: 0.4 10*3/uL (ref 0.1–0.9)
MONO%: 14.1 % — ABNORMAL HIGH (ref 0.0–14.0)
NEUT#: 1.7 10*3/uL (ref 1.5–6.5)
NEUT%: 60.8 % (ref 38.4–76.8)
Platelets: 135 10*3/uL — ABNORMAL LOW (ref 145–400)
RBC: 3.52 10*6/uL — ABNORMAL LOW (ref 3.70–5.45)
RDW: 16.9 % — ABNORMAL HIGH (ref 11.2–14.5)
WBC: 2.7 10*3/uL — ABNORMAL LOW (ref 3.9–10.3)
lymph#: 0.6 10*3/uL — ABNORMAL LOW (ref 0.9–3.3)

## 2014-11-17 MED ORDER — IOHEXOL 300 MG/ML  SOLN
100.0000 mL | Freq: Once | INTRAMUSCULAR | Status: AC | PRN
Start: 2014-11-17 — End: 2014-11-17
  Administered 2014-11-17: 100 mL via INTRAVENOUS

## 2014-11-17 NOTE — Telephone Encounter (Signed)
-----   Message from Leonie Man, MD sent at 11/14/2014 12:29 PM EDT ----- Overall kidney function looks stable as does potassium. I think we have done a pretty good job with diuresis. We can reduce Lasix back to 40 mg a day from 40 in the morning and 20 in the evening.  Leonie Man, MD

## 2014-11-17 NOTE — Telephone Encounter (Signed)
Spoke to patient's daughter Jenny Reichmann. Result given . Verbalized understanding Reduce lasix 40 mg daily. Daughter aware.

## 2014-11-18 ENCOUNTER — Telehealth: Payer: Self-pay | Admitting: Oncology

## 2014-11-18 ENCOUNTER — Ambulatory Visit (HOSPITAL_BASED_OUTPATIENT_CLINIC_OR_DEPARTMENT_OTHER): Payer: Medicare HMO | Admitting: Oncology

## 2014-11-18 VITALS — BP 124/74 | HR 89 | Temp 97.6°F | Resp 18 | Ht 66.0 in | Wt 175.3 lb

## 2014-11-18 DIAGNOSIS — R0600 Dyspnea, unspecified: Secondary | ICD-10-CM

## 2014-11-18 DIAGNOSIS — D509 Iron deficiency anemia, unspecified: Secondary | ICD-10-CM

## 2014-11-18 DIAGNOSIS — C8290 Follicular lymphoma, unspecified, unspecified site: Secondary | ICD-10-CM

## 2014-11-18 DIAGNOSIS — J90 Pleural effusion, not elsewhere classified: Secondary | ICD-10-CM

## 2014-11-18 DIAGNOSIS — I4892 Unspecified atrial flutter: Secondary | ICD-10-CM

## 2014-11-18 DIAGNOSIS — R0601 Orthopnea: Secondary | ICD-10-CM

## 2014-11-18 NOTE — Progress Notes (Signed)
[facility] OFFICE PROGRESS NOTE   Diagnosis: Non-Hodgkin's lymphoma  INTERVAL HISTORY:   Amanda Sellers returns as scheduled. She completed another cycle of bendamustine/rituximab on 10/27/2014. She is now followed closely by cardiology for management of atrial fibrillation and congestive heart failure. Her medical regimen has been adjusted. Mr. reports a good appetite. She developed erythema diffusely and pruritus following the CT scan yesterday. She has been taking Benadryl.  Objective:  Vital signs in last 24 hours:  Blood pressure 124/74, pulse 89, temperature 97.6 F (36.4 C), temperature source Oral, resp. rate 18, height '5\' 6"'  (1.676 m), weight 175 lb 4.8 oz (79.516 kg), SpO2 100 %.    HEENT: No thrush or ulcers Lymphatics: No cervical, supraclavicular, or axillary nodes Resp: Lungs clear bilaterally Cardio: Irregular GI: No hepatosplenomegaly Vascular: 1+ edema at the low leg bilaterally  Skin: Confluence erythema over the trunk, patches of erythema over the legs    Lab Results:  Lab Results  Component Value Date   WBC 2.7* 11/17/2014   HGB 9.9* 11/17/2014   HCT 30.3* 11/17/2014   MCV 85.9 11/17/2014   PLT 135* 11/17/2014   NEUTROABS 1.7 11/17/2014    Imaging:  Ct Chest W Contrast  11/17/2014  CLINICAL DATA:  Restaging follicular lymphoma diagnosed in July 2016. Chemotherapy completed 3 weeks ago. Shortness of breath. Subsequent encounter. EXAM: CT CHEST, ABDOMEN, AND PELVIS WITH CONTRAST TECHNIQUE: Multidetector CT imaging of the chest, abdomen and pelvis was performed following the standard protocol during bolus administration of intravenous contrast. CONTRAST:  184m OMNIPAQUE IOHEXOL 300 MG/ML  SOLN COMPARISON:  CT 08/07/2014 and 07/23/2014. FINDINGS: CT CHEST FINDINGS Mediastinum/Nodes: There has been significant interval improvement in the previously demonstrated thoracic adenopathy. There are no residual enlarged axillary lymph nodes. 1.4 cm  high right paratracheal node on image number 11 and 1.8 cm subcarinal node on image 27 are improved. There are small residual hilar lymph nodes bilaterally. There is a moderate size hiatal hernia. The heart size is normal. There is no pericardial effusion. Diffuse atherosclerosis of the aorta, great vessels and coronary arteries again noted. Lungs/Pleura: Right-greater-than-left dependent pleural effusions have enlarged, although appear simple. The previously demonstrated multiple small pulmonary nodules bilaterally have improved. The largest remaining nodule measures 5 mm in the lingula on image 30. There is mild subpleural reticulation and interstitial prominence throughout the lungs, but no focal airspace disease. Musculoskeletal/Chest wall: No chest wall mass or suspicious osseous findings. Chronic inferior endplate compression deformity at T12 appears stable. There is a subacute appearing fracture of the left tenth rib laterally. CT ABDOMEN AND PELVIS FINDINGS Hepatobiliary: Previously demonstrated small hypervascular lesion in the lateral segment of left hepatic lobe is less evident, seen on image 49. This is probably an incidental vascular finding. No new or enlarging hepatic lesions identified. Calcified gallstone again noted. There is no gallbladder wall thickening, surrounding inflammation or biliary dilatation. Pancreas: Unremarkable. No pancreatic ductal dilatation or surrounding inflammatory changes. Spleen: The spleen is now normal in size without focal abnormality. Adrenals/Urinary Tract: Both adrenal glands appear normal. There is a small nonobstructing calculus in the lower pole of the left kidney. The multiple ill-defined low-density renal lesions demonstrated previously have significantly improved. There are small residual low-density lesions inferiorly in the left kidney. No hydronephrosis or ureteral calculus demonstrated. The bladder appears normal. Stomach/Bowel: No evidence of bowel wall  thickening, distention or surrounding inflammatory change. Vascular/Lymphatic: Significant interval improvement in previously demonstrated lymphadenopathy. The largest residual node in the portacaval space is  lower in density, measuring 1.8 cm on image 57. No other residual enlarged lymph nodes demonstrated. There is mild edema in the base of the mesentery and retroperitoneum. Atherosclerosis of the aorta, its branches and the iliac arteries noted. Reproductive: Stable without suspicious findings. Other: No evidence of abdominal wall mass or hernia. Musculoskeletal: No acute or significant osseous findings. Lower lumbar spondylosis noted. IMPRESSION: 1. Significant interval response to therapy with significant improvement in previously demonstrated adenopathy throughout the chest, abdomen and pelvis, resolution of splenomegaly, and significant improvement in the previously demonstrated pulmonary and bilateral renal nodularity. No disease progression seen. 2. Enlarging bilateral pleural effusions with associated mild bibasilar atelectasis. 3. Diffuse atherosclerosis. 4. Cholelithiasis without evidence of cholecystitis or biliary dilatation. 5. Chronic T12 compression deformity. Subacute appearing fracture of the left tenth rib Electronically Signed   By: Richardean Sale M.D.   On: 11/17/2014 10:35   Ct Abdomen Pelvis W Contrast  11/17/2014  CLINICAL DATA:  Restaging follicular lymphoma diagnosed in July 2016. Chemotherapy completed 3 weeks ago. Shortness of breath. Subsequent encounter. EXAM: CT CHEST, ABDOMEN, AND PELVIS WITH CONTRAST TECHNIQUE: Multidetector CT imaging of the chest, abdomen and pelvis was performed following the standard protocol during bolus administration of intravenous contrast. CONTRAST:  172m OMNIPAQUE IOHEXOL 300 MG/ML  SOLN COMPARISON:  CT 08/07/2014 and 07/23/2014. FINDINGS: CT CHEST FINDINGS Mediastinum/Nodes: There has been significant interval improvement in the previously  demonstrated thoracic adenopathy. There are no residual enlarged axillary lymph nodes. 1.4 cm high right paratracheal node on image number 11 and 1.8 cm subcarinal node on image 27 are improved. There are small residual hilar lymph nodes bilaterally. There is a moderate size hiatal hernia. The heart size is normal. There is no pericardial effusion. Diffuse atherosclerosis of the aorta, great vessels and coronary arteries again noted. Lungs/Pleura: Right-greater-than-left dependent pleural effusions have enlarged, although appear simple. The previously demonstrated multiple small pulmonary nodules bilaterally have improved. The largest remaining nodule measures 5 mm in the lingula on image 30. There is mild subpleural reticulation and interstitial prominence throughout the lungs, but no focal airspace disease. Musculoskeletal/Chest wall: No chest wall mass or suspicious osseous findings. Chronic inferior endplate compression deformity at T12 appears stable. There is a subacute appearing fracture of the left tenth rib laterally. CT ABDOMEN AND PELVIS FINDINGS Hepatobiliary: Previously demonstrated small hypervascular lesion in the lateral segment of left hepatic lobe is less evident, seen on image 49. This is probably an incidental vascular finding. No new or enlarging hepatic lesions identified. Calcified gallstone again noted. There is no gallbladder wall thickening, surrounding inflammation or biliary dilatation. Pancreas: Unremarkable. No pancreatic ductal dilatation or surrounding inflammatory changes. Spleen: The spleen is now normal in size without focal abnormality. Adrenals/Urinary Tract: Both adrenal glands appear normal. There is a small nonobstructing calculus in the lower pole of the left kidney. The multiple ill-defined low-density renal lesions demonstrated previously have significantly improved. There are small residual low-density lesions inferiorly in the left kidney. No hydronephrosis or ureteral  calculus demonstrated. The bladder appears normal. Stomach/Bowel: No evidence of bowel wall thickening, distention or surrounding inflammatory change. Vascular/Lymphatic: Significant interval improvement in previously demonstrated lymphadenopathy. The largest residual node in the portacaval space is lower in density, measuring 1.8 cm on image 57. No other residual enlarged lymph nodes demonstrated. There is mild edema in the base of the mesentery and retroperitoneum. Atherosclerosis of the aorta, its branches and the iliac arteries noted. Reproductive: Stable without suspicious findings. Other: No evidence of abdominal wall mass  or hernia. Musculoskeletal: No acute or significant osseous findings. Lower lumbar spondylosis noted. IMPRESSION: 1. Significant interval response to therapy with significant improvement in previously demonstrated adenopathy throughout the chest, abdomen and pelvis, resolution of splenomegaly, and significant improvement in the previously demonstrated pulmonary and bilateral renal nodularity. No disease progression seen. 2. Enlarging bilateral pleural effusions with associated mild bibasilar atelectasis. 3. Diffuse atherosclerosis. 4. Cholelithiasis without evidence of cholecystitis or biliary dilatation. 5. Chronic T12 compression deformity. Subacute appearing fracture of the left tenth rib Electronically Signed   By: Richardean Sale M.D.   On: 11/17/2014 10:35    Medications: I have reviewed the patient's current medications.  Assessment/Plan: 1. Non-Hodgkin's Lymphoma-bone marrow biopsy 07/24/2014 consistent with involvement by follicular B-cell lymphoma, CD20 positive   CTs of the chest 07/22/2014 and CT of the abdomen and pelvis on 07/23/2014-pulmonary nodules, hilar/mediastinal/supraclavicular/axillary adenopathy, splenomegaly, abdominal adenopathy, bilateral adrenal nodules   Cycle 1 bendamustine/rituximab 08/04/2014  Cycle 2 bendamustine/Rituxan 09/01/2014  Cycle 3  bendamustine/Rituxan 09/30/2014  Cycle 4 bendamustine/rituximab 10/27/2014  Restaging CT scans 11/17/2014 with significant improvement in diffuse lymphadenopathy, lung nodules, and renal lesions 2. Severe microcytic anemia, status post transfusion with packed red blood cells 07/23/2014   Normal ferritin, low transferrin saturation, "scant" bone marrow iron stores, review of peripheral blood smear consistent with iron deficiency anemia   She has been referred for outpatient GI evaluation  Recent colonoscopy and upper endoscopy 3. Exertional dyspnea /orthopnea-most likely secondary to congestive heart failure  4. History of a B-12 deficiency  5. Rash over the trunk and extremities 08/18/2014. Appears consistent with a drug rash. Resolved 08/28/2014.  Rash 11/18/2014, likely secondary to a contrast allergy , diagnostic contrast will be listed as an allergy 6. Colonoscopy 06/03/2008. Medium sized internal hemorrhoids.  7. Admission with atrial flutter with a rapid ventricular response 10/15/2014-status post cardioversion 10/16/2014, maintained on  apixaban    Disposition:  Ms. Colantonio has completed 4 cycles of bendamustine/rituximab. The palpable lymphadenopathy has resolved and there has been marked improvement in the lymphadenopathy and lung nodules on restaging CT scans. The increased pleural effusions are likely related to heart failure.  I discussed treatment options with Ms. Losada and her daughter. We reviewed the CT images. They understand the lymphoma may progress in the future and require additional treatment. I think the likelihood of additional bendamustine/rituximab yielding a "cure "or more durable remission is small. We decided to follow her with observation. She is now followed closely by cardiology for management of atrial fibrillation and congestive heart failure. She will return for an office visit and CBC in 6 weeks. [name], MD  11/18/2014  10:52  AM

## 2014-11-18 NOTE — Telephone Encounter (Signed)
Gave adn printed appt sched and avs for pt for DEC  °

## 2014-11-19 ENCOUNTER — Encounter (HOSPITAL_COMMUNITY): Payer: Self-pay | Admitting: Nurse Practitioner

## 2014-11-19 ENCOUNTER — Ambulatory Visit (HOSPITAL_COMMUNITY)
Admission: RE | Admit: 2014-11-19 | Discharge: 2014-11-19 | Disposition: A | Discharge status: Home / Self Care | Payer: Medicare HMO | Source: Ambulatory Visit | Attending: Nurse Practitioner | Admitting: Nurse Practitioner

## 2014-11-19 ENCOUNTER — Other Ambulatory Visit (HOSPITAL_COMMUNITY): Payer: Self-pay | Admitting: *Deleted

## 2014-11-19 VITALS — BP 110/72 | HR 88 | Ht 63.0 in | Wt 176.8 lb

## 2014-11-19 DIAGNOSIS — I4891 Unspecified atrial fibrillation: Secondary | ICD-10-CM

## 2014-11-19 DIAGNOSIS — I481 Persistent atrial fibrillation: Secondary | ICD-10-CM | POA: Diagnosis present

## 2014-11-19 MED ORDER — AMIODARONE HCL 200 MG PO TABS: 200.0000 mg | ORAL_TABLET | Freq: Two times a day (BID) | ORAL | Status: DC

## 2014-11-19 NOTE — Addendum Note (Signed)
Encounter addended by: Sherran Needs, NP on: 11/19/2014  4:24 PM<BR>     Documentation filed: Follow-up Section, LOS Section

## 2014-11-19 NOTE — Progress Notes (Signed)
Patient ID: Amanda Sellers, female   DOB: 1932/05/17, 81 y.o.   MRN: [id number]           Primary Care Physician: Tammi Sou, MD Referring Physician: Dr. Ronni Sellers Amanda Sellers is a 81 y.o. female with a h/o  diastolic heart failure, afib, newly diagnosed with recent dx of lymphoma. She was admitted to [facility] 9/22 on IV dilt and given diuretics for acute on chronic systolic HF. It was arranged for to undergo TEE DCCV.  She had exertional dyspnea with PND & orthopnea.She felt very tired & had no energy. She  noted a few palpitations, but does not recall rapid irregular heart rates/rhythms.   She had TEE without thrombus and underwent DCCV that was successful with one shock of 120J. She did well post procedure and was seen by Dr. Jerilynn Mages. Croitoru and found stable for discharge. She was negative 2700 since admit and weightwas down 4 lbs with IV diuretics. D/C wt is 168.  Plan was for Mandatory uninterrupted anticoagulation for next 30 days, preferably lifelong barring bleeding complications. AFib clinic follow up in 1-2 weeks. Outpatient [facility], non-urgent. .ASA stopped.   She was seen in the afib clinic and found to be in afib with rvr.She went into afib short time after cardioversion. EKG shows afib at 130-140 bpm, although she is tolerating well. She is not on any drugs for rate control.Is taking DOAC without fail. Continues with chemotherapy for lymphoma.   She was started on BB and now returned with better v rates. 80's with sitting and 90-100's with activity. She does not have any PND/orthopnea although she has slept better in her recliner for months now.No lower extremity edema. Her weight is stable at home.  She has just finished chemotherapy for non- hodgkin's lymphoma and had f/u Pet scan and CA appears to be in remission, so per pt, no more chemo for right now. She has been on DOAC now x one month so will start attempts to restore SR. Discussed options  amiodarone or tikosyn and she would prefer amiodarone as she has two neighbors on amiodarone and they are both in rhythm and tolerating drug well. She is not interested in a hospital stay for drug loading. She reports rash with recent contrast dye.  Today, she denies symptoms of palpitations, chest pain,  orthopnea, PND, mild lower extremity edema, no dizziness, presyncope, syncope, or neurologic sequela. Fatigue and mild dyspnea. The patient is tolerating medications without difficulties and is otherwise without complaint today.   Past Medical History  Diagnosis Date  . Vitamin B 12 deficiency   . HTN (hypertension)   . Vitamin D deficiency   . HLD (hyperlipidemia)      06/2014  . Chronic diastolic congestive heart failure ([place])     Echo 07/23/14: mild LVH, EF 50-55%, grade 2 diast dysfxn  . Microcytic anemia     transfused 3 U total in hosp 07/2014  . Positive occult stool blood test 08/08/14    Dr. Watt Climes to do endoscopies as of 09/07/14  . Hypothyroidism   . Pancytopenia due to antineoplastic chemotherapy ([place])   . Atrial fibrillation ([place])     With RVR  . Anticoagulation adequate, Eliquis 10/16/2014  . Chronic diastolic congestive heart failure ([place]) 1/60/1093    Complicated by atrial fibrillation  . Anxiety   . Pulmonary metastases ([place]) 07/24/2014  . Follicular lymphoma ([place])     Non Hodgkins B cell lymphoma;    Cycle 1 bendamustine/rituximab  08/04/2014   Past Surgical History  Procedure Laterality Date  . Breast biopsy    . Bone marrow biopsy  07/24/14  . Abdominal hysterectomy  1978  . Transthoracic echocardiogram  07/23/14    mild LVH, EF 50-55%, grade 2 diast dysfxn  . Upper gi endoscopy  09/23/2014    small hiatus hernia, nodules in stomach biopsied (chronic active erosive atrophic gastritis with intestinal metaplasia--no dysplasia or malignancy) otherwise normal  . Colonoscopy  09/23/14    small hemorrhoids, otherwise normal (performed for IDA and heme+ stool)  . Tee without  cardioversion N/A 10/16/2014    Procedure: TRANSESOPHAGEAL ECHOCARDIOGRAM (TEE);  Surgeon: Lelon Perla, MD;  Location: [place];  Service: Cardiovascular;  Laterality: N/A;  . Cardioversion N/A 10/16/2014    Procedure: CARDIOVERSION;  Surgeon: Lelon Perla, MD;  Location[facility] ENDOSCOPY;  Service: Cardiovascular;  Laterality: N/A;  . Nm myoview ltd  10/29/2014    medium size mild surgery defect in the mid anterior and apical anterior location suggestive of breast attenuation. No reversibility. LOW RISK    Current Outpatient Prescriptions  Medication Sig Dispense Refill  . ALPRAZolam (XANAX) 0.5 MG tablet Take 1 tablet by mouth every 6 (six) hours as needed. anxiety    . apixaban (ELIQUIS) 5 MG TABS tablet Take 1 tablet (5 mg total) by mouth 2 (two) times daily. 56 tablet 0  . ergocalciferol (VITAMIN D2) 50000 UNITS capsule Take 50,000 Units by mouth once a week. On Sundays    . ferrous sulfate 325 (65 FE) MG tablet Take 1 tablet (325 mg total) by mouth 2 (two) times daily with a meal. 30 tablet 1  . fish oil-omega-3 fatty acids 1000 MG capsule Take 2 g by mouth daily.    . furosemide (LASIX) 40 MG tablet TAKE 40 MG BY  MOUTH DAILY. 90 tablet 3  . levothyroxine (SYNTHROID, LEVOTHROID) 100 MCG tablet Take 1 tablet (100 mcg total) by mouth daily before breakfast. 30 tablet 3  . metoprolol succinate (TOPROL-XL) 50 MG 24 hr tablet Take 1 tablet (50 mg total) by mouth 2 (two) times daily. Take with or immediately following a meal. 180 tablet 3  . OXYGEN Inhale into the lungs. 1Lt at bedtime    . pravastatin (PRAVACHOL) 80 MG tablet Take 80 mg by mouth daily.    Marland Kitchen PRESCRIPTION MEDICATION Chemo. Last dose was 10-02-14 at [facility].    Marland Kitchen prochlorperazine (COMPAZINE) 10 MG tablet Take 1 tablet by mouth 3 (three) times daily before meals.     . prochlorperazine (COMPAZINE) 5 MG tablet Take 1 tablet (5 mg total) by mouth every 6 (six) hours as needed for nausea or vomiting. 30 tablet 1  . amiodarone  (PACERONE) 200 MG tablet Take 1 tablet (200 mg total) by mouth 2 (two) times daily. 60 tablet 3   No current facility-administered medications for this encounter.    Allergies  Allergen Reactions  . Iodinated Diagnostic Agents Rash    CT Contrast    Social History   Social History  . Marital Status: Married    Spouse Name: N/A  . Number of Children: 8  . Years of Education: N/A   Occupational History  . retired    Social History Main Topics  . Smoking status: Never Smoker   . Smokeless tobacco: Never Used  . Alcohol Use: No  . Drug Use: No  . Sexual Activity: Not on file   Other Topics Concern  . Not on file   Social  History Narrative   Widowed, has 8 children.   Orig from [place], now lives in [place].   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   No tob/alc/drugs.    Family History  Problem Relation Age of Onset  . Melanoma Mother   . Stomach cancer Father   . Lymphoma Sister   . Diabetes Daughter     ROS- All systems are reviewed and negative except as per the HPI above  Physical Exam: Filed Vitals:   11/19/14 1122  BP: 110/72  Pulse: 88  Height: '5\' 3"'  (1.6 m)  Weight: 176 lb 12.8 oz (80.196 kg)    GEN- The patient is well appearing, alert and oriented x 3 today.   Head- normocephalic, atraumatic Eyes-  Sclera clear, conjunctiva pink Ears- hearing intact Oropharynx- clear Neck- supple, no JVP Lymph- no cervical lymphadenopathy Lungs- Clear to ausculation bilaterally, normal work of breathing Heart- Rapid, irregular rate and rhythm, no murmurs, rubs or gallops, PMI not laterally displaced GI- soft, NT, ND, + BS Extremities- no clubbing, cyanosis, or edema MS- no significant deformity or atrophy Skin- no rash or lesion Psych- euthymic mood, full affect Neuro- strength and sensation are intact  EKG- Afib with rvr at 102 bpm with aberrantly conducted beats or pvc's. Qrs int 84 ms, Qrs 84 sm, Qtc 461  ms. Epic records reviewed.  Assessment and Plan: 1. Persistent symptomatic afib Will initiate attempts to restore rhythm, discussed with Dr. Rayann Heman Will start amiodarone 200 mg bid and bring back in one week Will have to watch thyroid tests due to thyroid issues at baseline There was a red flag on adding amiodarone with recent iodine, contrast allergy, but discussed with pharmD, and she states that this should not be a contraindication.  Will continue usual metoprolol for now. Continue apixaban for chadsvasc score of at least 4.    F/u afib clinic one week  Geroge Baseman. Macario Shear, [facility] [address] [place], [place] [address] [phone]

## 2014-11-25 ENCOUNTER — Ambulatory Visit (HOSPITAL_COMMUNITY)
Admission: RE | Admit: 2014-11-25 | Discharge: 2014-11-25 | Disposition: A | Discharge status: Home / Self Care | Payer: Medicare HMO | Source: Ambulatory Visit | Attending: Nurse Practitioner | Admitting: Nurse Practitioner

## 2014-11-25 ENCOUNTER — Telehealth (HOSPITAL_COMMUNITY): Payer: Self-pay | Admitting: *Deleted

## 2014-11-25 ENCOUNTER — Encounter (HOSPITAL_COMMUNITY): Payer: Self-pay | Admitting: Nurse Practitioner

## 2014-11-25 DIAGNOSIS — R0602 Shortness of breath: Secondary | ICD-10-CM | POA: Diagnosis not present

## 2014-11-25 DIAGNOSIS — I481 Persistent atrial fibrillation: Secondary | ICD-10-CM | POA: Diagnosis not present

## 2014-11-25 LAB — CBC
HCT: 32.5 % — ABNORMAL LOW (ref 36.0–46.0)
Hemoglobin: 10.3 g/dL — ABNORMAL LOW (ref 12.0–15.0)
MCH: 28.1 pg (ref 26.0–34.0)
MCHC: 31.7 g/dL (ref 30.0–36.0)
MCV: 88.8 fL (ref 78.0–100.0)
Platelets: 126 10*3/uL — ABNORMAL LOW (ref 150–400)
RBC: 3.66 MIL/uL — ABNORMAL LOW (ref 3.87–5.11)
RDW: 16.5 % — ABNORMAL HIGH (ref 11.5–15.5)
WBC: 3.7 10*3/uL — ABNORMAL LOW (ref 4.0–10.5)

## 2014-11-25 LAB — COMPREHENSIVE METABOLIC PANEL
ALT: 28 U/L (ref 14–54)
AST: 35 U/L (ref 15–41)
Albumin: 3.5 g/dL (ref 3.5–5.0)
Alkaline Phosphatase: 71 U/L (ref 38–126)
Anion gap: 13 (ref 5–15)
BUN: 8 mg/dL (ref 6–20)
CO2: 32 mmol/L (ref 22–32)
Calcium: 9.1 mg/dL (ref 8.9–10.3)
Chloride: 100 mmol/L — ABNORMAL LOW (ref 101–111)
Creatinine, Ser: 0.86 mg/dL (ref 0.44–1.00)
GFR calc Af Amer: >60 mL/min (ref >60)
GFR calc non Af Amer: >60 mL/min (ref >60)
Glucose, Bld: 107 mg/dL — ABNORMAL HIGH (ref 65–99)
Potassium: 3.7 mmol/L (ref 3.5–5.1)
Sodium: 145 mmol/L (ref 135–145)
Total Bilirubin: 0.8 mg/dL (ref 0.3–1.2)
Total Protein: 5.8 g/dL — ABNORMAL LOW (ref 6.5–8.1)

## 2014-11-25 LAB — TSH: TSH: 5.593 u[IU]/mL — ABNORMAL HIGH (ref 0.350–4.500)

## 2014-11-25 LAB — BRAIN NATRIURETIC PEPTIDE: B Natriuretic Peptide: 378.4 pg/mL — ABNORMAL HIGH (ref 0.0–100.0)

## 2014-11-25 MED ORDER — AMIODARONE HCL 200 MG PO TABS: 200.0000 mg | ORAL_TABLET | Freq: Every day | ORAL | Status: DC

## 2014-11-25 MED ORDER — POTASSIUM CHLORIDE ER 20 MEQ PO TBCR: 20.0000 meq | EXTENDED_RELEASE_TABLET | Freq: Every day | ORAL | Status: DC

## 2014-11-25 NOTE — Patient Instructions (Signed)
Your physician has recommended you make the following change in your medication:  1)Decrease amiodarone to 200mg  once a day 2)Take extra 40mg  lasix when you get home and then another dose of 40mg  of lasix around supper time.

## 2014-11-25 NOTE — Progress Notes (Signed)
Patient ID: Amanda Sellers, female   DOB: October 15, 1932, 81 y.o.   MRN: [id number]         Primary Care Physician: Tammi Sou, MD Referring Physician: Dr. Ronni Rumble [name] is a 81 y.o. female with a h/o  diastolic heart failure, afib, newly diagnosed with recent dx of lymphoma. She was admitted to [facility] 9/22 on IV dilt and given diuretics for acute on chronic systolic HF. It was arranged for to undergo TEE DCCV.  She had exertional dyspnea with PND & orthopnea.She felt very tired & had no energy. She  noted a few palpitations, but does not recall rapid irregular heart rates/rhythms.   She had TEE without thrombus and underwent DCCV that was successful with one shock of 120J. She did well post procedure and was seen by Dr. Jerilynn Mages. Croitoru and found stable for discharge. She was negative 2700 since admit and weightwas down 4 lbs with IV diuretics. D/C wt is 168.  Plan was for Mandatory uninterrupted anticoagulation for next 30 days, preferably lifelong barring bleeding complications. AFib clinic follow up in 1-2 weeks. [facility], non-urgent. .ASA stopped.   She was seen in the afib clinic and found to be in afib with rvr.She went into afib short time after cardioversion. EKG shows afib at 130-140 bpm, although she is tolerating well. She is not on any drugs for rate control.Is taking DOAC without fail. Continues with chemotherapy for lymphoma.   She was started on BB and now returned with better v rates. 80's with sitting and 90-100's with activity. She does not have any PND/orthopnea although she has slept better in her recliner for months now.No lower extremity edema. Her weight is stable at home.  On visit  10/27,she had  finished chemotherapy for non- hodgkin's lymphoma and had f/u Pet scan and CA appears to be in remission, so per pt, no more chemo for right now. She has been on DOAC now x one month so will start attempts to restore SR. Discussed options  amiodarone or tikosyn and she would prefer amiodarone as she has two neighbors on amiodarone and they are both in rhythm and tolerating drug well. She was not interested in a hospital stay for drug loading. She reports rash with recent contrast dye.  She asked to be seen today, 11/2, in the afib clinic for increased shortness of breath yesterday and last night. She has slept in the recliner for months but did not feel as well last night and slept poorly, feeling more short of breath and fullness in her chest.She has noticed increased pedal edema. She did not take her amiodarone last night or this am. Ekg shows rate controlled afib at 82 bpm, qtc at 464.PO on RA is 98%. Weight is up 2 lbs from last visit, 6 lbs since 10/17. Lasix dose was decreased to 40 mg daily from 40 mg am and 20 mg pm. Pt has noticed weight climbing for several days. She is fairly comfortable when quiet and sitting.   Today, she is positive for symptoms of palpitations, dyspnea,chest fullness,  positive for orthopnea/PND, 2+ lower extremity edema, no dizziness, presyncope, syncope, or neurologic sequela. Positive for Fatigue and mild dyspnea. The patient is tolerating medications without difficulties and is otherwise without complaint today.   Past Medical History  Diagnosis Date  . Vitamin B 12 deficiency   . HTN (hypertension)   . Vitamin D deficiency   . HLD (hyperlipidemia)      06/2014  .  Chronic diastolic congestive heart failure ([place])     Echo 07/23/14: mild LVH, EF 50-55%, grade 2 diast dysfxn  . Microcytic anemia     transfused 3 U total in hosp 07/2014  . Positive occult stool blood test 08/08/14    Dr. Watt Climes to do endoscopies as of 09/07/14  . Hypothyroidism   . Pancytopenia due to antineoplastic chemotherapy ([place])   . Atrial fibrillation ([place])     With RVR  . Anticoagulation adequate, Eliquis 10/16/2014  . Chronic diastolic congestive heart failure ([place]) 4/92/0100    Complicated by atrial fibrillation  . Anxiety     . Pulmonary metastases ([place]) 07/24/2014  . Follicular lymphoma ([place])     Non Hodgkins B cell lymphoma;    Cycle 1 bendamustine/rituximab 08/04/2014   Past Surgical History  Procedure Laterality Date  . Breast biopsy    . Bone marrow biopsy  07/24/14  . Abdominal hysterectomy  1978  . Transthoracic echocardiogram  07/23/14    mild LVH, EF 50-55%, grade 2 diast dysfxn  . Upper gi endoscopy  09/23/2014    small hiatus hernia, nodules in stomach biopsied (chronic active erosive atrophic gastritis with intestinal metaplasia--no dysplasia or malignancy) otherwise normal  . Colonoscopy  09/23/14    small hemorrhoids, otherwise normal (performed for IDA and heme+ stool)  . Tee without cardioversion N/A 10/16/2014    Procedure: TRANSESOPHAGEAL ECHOCARDIOGRAM (TEE);  Surgeon: Lelon Perla, MD;  Location: [place];  Service: Cardiovascular;  Laterality: N/A;  . Cardioversion N/A 10/16/2014    Procedure: CARDIOVERSION;  Surgeon: Lelon Perla, MD;  Location: [facility] ENDOSCOPY;  Service: Cardiovascular;  Laterality: N/A;  . Nm myoview ltd  10/29/2014    medium size mild surgery defect in the mid anterior and apical anterior location suggestive of breast attenuation. No reversibility. LOW RISK    Current Outpatient Prescriptions  Medication Sig Dispense Refill  . ALPRAZolam (XANAX) 0.5 MG tablet Take 1 tablet by mouth every 6 (six) hours as needed. anxiety    . amiodarone (PACERONE) 200 MG tablet Take 1 tablet (200 mg total) by mouth daily. 60 tablet 3  . apixaban (ELIQUIS) 5 MG TABS tablet Take 1 tablet (5 mg total) by mouth 2 (two) times daily. 56 tablet 0  . ergocalciferol (VITAMIN D2) 50000 UNITS capsule Take 50,000 Units by mouth once a week. On Sundays    . ferrous sulfate 325 (65 FE) MG tablet Take 1 tablet (325 mg total) by mouth 2 (two) times daily with a meal. 30 tablet 1  . fish oil-omega-3 fatty acids 1000 MG capsule Take 2 g by mouth daily.    . furosemide (LASIX) 40 MG tablet TAKE 40  MG BY  MOUTH DAILY. 90 tablet 3  . levothyroxine (SYNTHROID, LEVOTHROID) 100 MCG tablet Take 1 tablet (100 mcg total) by mouth daily before breakfast. 30 tablet 3  . metoprolol succinate (TOPROL-XL) 50 MG 24 hr tablet Take 1 tablet (50 mg total) by mouth 2 (two) times daily. Take with or immediately following a meal. 180 tablet 3  . OXYGEN Inhale into the lungs. 1Lt at bedtime    . pravastatin (PRAVACHOL) 80 MG tablet Take 80 mg by mouth daily.    Marland Kitchen PRESCRIPTION MEDICATION Chemo. Last dose was 10-02-14 at [facility].    Marland Kitchen prochlorperazine (COMPAZINE) 10 MG tablet Take 1 tablet by mouth 3 (three) times daily before meals.     . prochlorperazine (COMPAZINE) 5 MG tablet Take 1 tablet (5 mg total) by mouth every  6 (six) hours as needed for nausea or vomiting. 30 tablet 1   No current facility-administered medications for this encounter.    Allergies  Allergen Reactions  . Iodinated Diagnostic Agents Rash    CT Contrast    Social History   Social History  . Marital Status: Married    Spouse Name: N/A  . Number of Children: 8  . Years of Education: N/A   Occupational History  . retired    Social History Main Topics  . Smoking status: Never Smoker   . Smokeless tobacco: Never Used  . Alcohol Use: No  . Drug Use: No  . Sexual Activity: Not on file   Other Topics Concern  . Not on file   Social History Narrative   Widowed, has 8 children.   Orig from [place], now lives in [place].   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   No tob/alc/drugs.    Family History  Problem Relation Age of Onset  . Melanoma Mother   . Stomach cancer Father   . Lymphoma Sister   . Diabetes Daughter     ROS- All systems are reviewed and negative except as per the HPI above  Physical Exam: Filed Vitals:   11/25/14 1057  BP: 116/84  Pulse: 82  Height: '5\' 5"'  (1.651 m)  Weight: 177 lb 6.4 oz (80.468 kg)    GEN- The patient is well appearing, alert and  oriented x 3 today.   Head- normocephalic, atraumatic Eyes-  Sclera clear, conjunctiva pink Ears- hearing intact Oropharynx- clear Neck- supple, no JVP Lymph- no cervical lymphadenopathy Lungs- Clear to ausculation bilaterally, normal work of breathing Heart- Rapid, irregular rate and rhythm, no murmurs, rubs or gallops, PMI not laterally displaced GI- soft, NT, ND, + BS Extremities- no clubbing, cyanosis, or edema MS- no significant deformity or atrophy Skin- no rash or lesion Psych- euthymic mood, full affect Neuro- strength and sensation are intact  EKG- Afib  at 82 bpm, qrs int 86 ms, qtc 464 ms, as/t wave abnormality, consider lateral ischemia  Epic records reviewed.  Stress myoview, 11/03/14, -Defect 1: There is a medium defect of mild severity present in the mid anterior and apical anterior location.  This is a low risk study.  1. Low risk MV 2. Breast attenuation artifact 3. Study would not gate secondary to afib and PVCs   CXR- 11/2 Enlargement of cardiac silhouette with pulmonary vascular Congestion. COPD changes with bibasilar atelectasis Osseous demineralization with chronic T12 compression fracture  Labs 11/2 -cmet 11-2 kt 3.6, creat 0.7, BUN 16.3 liver enzymes unremarkable   Assessment and Plan: 1. Persistent symptomatic afib with increase dyspnea/ fluid overload/history of diastolic HF Will decrease amiodarone to 200 mg a day cxray/cmet,cbc,tsh,bnp today Plan on increasing lasix to 40 mg on arrival home, can take another 20  mg this afternoon around 5p if diuresis insufficient to start relieving symptoms Then 80 mg tomorrow am and 80 mg Friday am Will add K+ 20 meq a day Would like to schedule cardioversion as soon as fluid status stabilizes, possibly early next week Continue apixaban for chadsvasc score of at least 4.  If symptoms worsen she is to go to ER F/u in afib clinic on [place]. Jyoti Harju, [facility] [address] [place], [place] [address] [phone]

## 2014-11-25 NOTE — Telephone Encounter (Signed)
Pt daughter called in stating she was not feeling well it started yesterday morning and lasted all day.  States did not take her amiodarone last PM because of feeling poorly.  Says feels like her heart is "gonna burst out of her chest" but her HR is registering around 75 on her BP monitor.  Denies shortness of breath but does report feeling like someone is pushing her on the chest. No jam or arm pain.  Patient to come in to be evaluated shortly.

## 2014-11-27 ENCOUNTER — Encounter (HOSPITAL_COMMUNITY): Payer: Self-pay | Admitting: Nurse Practitioner

## 2014-11-27 ENCOUNTER — Ambulatory Visit (HOSPITAL_COMMUNITY)
Admission: RE | Admit: 2014-11-27 | Discharge: 2014-11-27 | Disposition: A | Discharge status: Home / Self Care | Payer: Medicare HMO | Source: Ambulatory Visit | Attending: Nurse Practitioner | Admitting: Nurse Practitioner

## 2014-11-27 VITALS — BP 122/70 | HR 88 | Ht 65.0 in | Wt 174.2 lb

## 2014-11-27 DIAGNOSIS — R946 Abnormal results of thyroid function studies: Secondary | ICD-10-CM | POA: Insufficient documentation

## 2014-11-27 DIAGNOSIS — I5032 Chronic diastolic (congestive) heart failure: Secondary | ICD-10-CM | POA: Diagnosis not present

## 2014-11-27 DIAGNOSIS — C8590 Non-Hodgkin lymphoma, unspecified, unspecified site: Secondary | ICD-10-CM | POA: Diagnosis not present

## 2014-11-27 DIAGNOSIS — I481 Persistent atrial fibrillation: Secondary | ICD-10-CM | POA: Diagnosis not present

## 2014-11-27 DIAGNOSIS — I4891 Unspecified atrial fibrillation: Secondary | ICD-10-CM

## 2014-11-27 LAB — BASIC METABOLIC PANEL
Anion gap: 6 (ref 5–15)
BUN: 11 mg/dL (ref 6–20)
CO2: 35 mmol/L — ABNORMAL HIGH (ref 22–32)
Calcium: 9.1 mg/dL (ref 8.9–10.3)
Chloride: 99 mmol/L — ABNORMAL LOW (ref 101–111)
Creatinine, Ser: 0.82 mg/dL (ref 0.44–1.00)
GFR calc Af Amer: >60 mL/min (ref >60)
GFR calc non Af Amer: >60 mL/min (ref >60)
Glucose, Bld: 122 mg/dL — ABNORMAL HIGH (ref 65–99)
Potassium: 3.9 mmol/L (ref 3.5–5.1)
Sodium: 140 mmol/L (ref 135–145)

## 2014-11-27 LAB — MAGNESIUM: Magnesium: 2.2 mg/dL (ref 1.7–2.4)

## 2014-11-27 MED ORDER — FUROSEMIDE 40 MG PO TABS: ORAL_TABLET | ORAL | Status: DC

## 2014-11-27 NOTE — Progress Notes (Addendum)
Patient ID: Amanda Sellers, female   DOB: 06-Feb-1932, 81 y.o.   MRN: [id number]           Primary Care Physician: Tammi Sou, MD Referring Physician: Dr. Ronni Rumble [name] is a 81 y.o. female with a h/o  diastolic heart failure, afib, newly diagnosed with recent dx of lymphoma. She was admitted to [facility] 9/22 on IV dilt and given diuretics for acute on chronic systolic HF. It was arranged for to undergo TEE DCCV.  She had exertional dyspnea with PND & orthopnea.She felt very tired & had little energy. She  noted a few palpitations, but does not recall rapid irregular heart rates/rhythms.   She had TEE without thrombus and underwent DCCV that was successful with one shock of 120J. She did well post procedure and was seen by Dr. Jerilynn Mages. Croitoru and found stable for discharge. She was negative 2700cc since admit and weight was down 4 lbs with IV diuretics. D/C wt is 168.  Plan on d/c was for  uninterrupted anticoagulation for next 30 days, preferably lifelong barring bleeding complications. AFib clinic follow up in 1-2 weeks. Outpatient [facility], non-urgent. .ASA stopped.   She was seen in the afib clinic, 10/10 and found to be in afib with rvr. She went into afib short time after cardioversion. EKG showed afib at 130-140 bpm. She was  not on any drugs for rate control.  Is taking DOAC without fail. Continues with chemotherapy for lymphoma.   She was started on BB and  Returned to clinic 10/12 with better v rates. 80's with sitting and 90-100's with activity. She does not have any PND/orthopnea although she has slept better in her recliner for months now.No lower extremity edema. Her weight is stable at home.  She has just finished chemotherapy for non- hodgkin's lymphoma and had f/u Pet scan 10/26,  and CA appears to be in remission, so per pt, no more chemo for right now. She has been on DOAC now x one month so will start attempts to restore SR. Discussed options  amiodarone or tikosyn and she would prefer amiodarone as she has two neighbors on amiodarone and they are both in rhythm and tolerating drug well. She is not interested in a hospital stay for drug loading. She reports rash with recent contrast dye. Amiodarone was started 200 mg bid, to then go down to 200 mg a day and then would discuss cardioversion.  She asked to be seen 11/2,  in the afib clinic for increased shortness of breath yesterday and last night. She has slept in the recliner for months but did not feel as well last night and slept poorly, feeling more short of breath and fullness in her chest.She has noticed increased pedal edema. She did not take her amiodarone last night or this am. Ekg shows rate controlled afib at 82 bpm, qtc at 464 ms.PO on RA is 98%. Weight is up 2 lbs from last visit, 6 lbs since 10/17. Lasix dose was decreased to 40 mg daily from 40 mg am and 20 mg pm when seen by Dr. Ellyn Hack 10/22.. Pt has noticed weight climbing for several days. She is fairly comfortable when quiet and sitting.   She was asked to double lasix to 80 mg  x 3 days, and to reduce amiodarone 200 mg a day. She has lost 3 pounds on return to clinic. Clinically, she is much improved with more energy and less shortness of breath. Able to sleep  well. Had enough energy to walk into clinic today without wheelchair and felt well enough to clean her house yesterday. Ideally,I would like to load amiodarone a while longer but will go ahead and  pursue DCCV sooner than later, as it is contributing to her heart failure. Compliant with blood thinner without missed doses On recent labs, TSH was elevated but daughter explained that she just started taking replacement correctly on an empty stomach, and sees PCP 11/16 and she expects him to adjust the dose.  Today, she denies symptoms of palpitations, chest pain,  orthopnea, PND, mild lower extremity edema, no dizziness, presyncope, syncope, or neurologic sequela. Fatigue and  mild dyspnea. The patient is tolerating medications without difficulties and is otherwise without complaint today.   Past Medical History  Diagnosis Date  . Vitamin B 12 deficiency   . HTN (hypertension)   . Vitamin D deficiency   . HLD (hyperlipidemia)      06/2014  . Chronic diastolic congestive heart failure ([place])     Echo 07/23/14: mild LVH, EF 50-55%, grade 2 diast dysfxn  . Microcytic anemia     transfused 3 U total in hosp 07/2014  . Positive occult stool blood test 08/08/14    Dr. Watt Climes to do endoscopies as of 09/07/14  . Hypothyroidism   . Pancytopenia due to antineoplastic chemotherapy ([place])   . Atrial fibrillation ([place])     With RVR  . Anticoagulation adequate, Eliquis 10/16/2014  . Chronic diastolic congestive heart failure ([place]) 1/61/0960    Complicated by atrial fibrillation  . Anxiety   . Pulmonary metastases ([place]) 07/24/2014  . Follicular lymphoma ([place])     Non Hodgkins B cell lymphoma;    Cycle 1 bendamustine/rituximab 08/04/2014   Past Surgical History  Procedure Laterality Date  . Breast biopsy    . Bone marrow biopsy  07/24/14  . Abdominal hysterectomy  1978  . Transthoracic echocardiogram  07/23/14    mild LVH, EF 50-55%, grade 2 diast dysfxn  . Upper gi endoscopy  09/23/2014    small hiatus hernia, nodules in stomach biopsied (chronic active erosive atrophic gastritis with intestinal metaplasia--no dysplasia or malignancy) otherwise normal  . Colonoscopy  09/23/14    small hemorrhoids, otherwise normal (performed for IDA and heme+ stool)  . Tee without cardioversion N/A 10/16/2014    Procedure: TRANSESOPHAGEAL ECHOCARDIOGRAM (TEE);  Surgeon: Lelon Perla, MD;  Location: [place] [facility] [place];  Service: Cardiovascular;  Laterality: N/A;  . Cardioversion N/A 10/16/2014    Procedure: CARDIOVERSION;  Surgeon: Lelon Perla, MD;  Location: [facility]COPY;  Service: Cardiovascular;  Laterality: N/A;  . Nm myoview ltd  10/29/2014    medium size mild surgery defect in the mid  anterior and apical anterior location suggestive of breast attenuation. No reversibility. LOW RISK    Current Outpatient Prescriptions  Medication Sig Dispense Refill  . ALPRAZolam (XANAX) 0.5 MG tablet Take 1 tablet by mouth every 6 (six) hours as needed. anxiety    . amiodarone (PACERONE) 200 MG tablet Take 1 tablet (200 mg total) by mouth daily. 60 tablet 3  . apixaban (ELIQUIS) 5 MG TABS tablet Take 1 tablet (5 mg total) by mouth 2 (two) times daily. 56 tablet 0  . ergocalciferol (VITAMIN D2) 50000 UNITS capsule Take 50,000 Units by mouth once a week. On Sundays    . ferrous sulfate 325 (65 FE) MG tablet Take 1 tablet (325 mg total) by mouth 2 (two) times daily with a meal. 30 tablet 1  .  fish oil-omega-3 fatty acids 1000 MG capsule Take 2 g by mouth daily.    . furosemide (LASIX) 40 MG tablet Take 3m by mouth in AM and take 260mby mouth in PM 90 tablet 3  . levothyroxine (SYNTHROID, LEVOTHROID) 100 MCG tablet Take 1 tablet (100 mcg total) by mouth daily before breakfast. 30 tablet 3  . metoprolol succinate (TOPROL-XL) 50 MG 24 hr tablet Take 1 tablet (50 mg total) by mouth 2 (two) times daily. Take with or immediately following a meal. 180 tablet 3  . OXYGEN Inhale into the lungs. 1Lt at bedtime    . potassium chloride 20 MEQ TBCR Take 20 mEq by mouth daily. 30 tablet 3  . pravastatin (PRAVACHOL) 80 MG tablet Take 80 mg by mouth daily.    . Marland KitchenRESCRIPTION MEDICATION Chemo. [facility]   . Marland Kitchenrochlorperazine (COMPAZINE) 10 MG tablet Take 1 tablet by mouth 3 (three) times daily before meals.     . prochlorperazine (COMPAZINE) 5 MG tablet Take 1 tablet (5 mg total) by mouth every 6 (six) hours as needed for nausea or vomiting. 30 tablet 1   No current facility-administered medications for this encounter.    Allergies  Allergen Reactions  . Iodinated Diagnostic Agents Rash    CT Contrast    Social History   Social History  . Marital Status: Married    Spouse Name:  N/A  . Number of Children: 8  . Years of Education: N/A   Occupational History  . retired    Social History Main Topics  . Smoking status: Never Smoker   . Smokeless tobacco: Never Used  . Alcohol Use: No  . Drug Use: No  . Sexual Activity: Not on file   Other Topics Concern  . Not on file   Social History Narrative   Widowed, has 8 children.   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   No tob/alc/drugs.    Family History  Problem Relation Age of Onset  . Melanoma Mother   . Stomach cancer Father   . Lymphoma Sister   . Diabetes Daughter     ROS- All systems are reviewed and negative except as per the HPI above  Physical Exam: Filed Vitals:   11/27/14 1052  BP: 122/70  Pulse: 88  Height: 5' 5" (1.651 m)  Weight: 174 lb 3.2 oz (79.017 kg)    GEN- The patient is well appearing, alert and oriented x 3 today.   Head- normocephalic, atraumatic Eyes-  Sclera clear, conjunctiva pink Ears- hearing intact Oropharynx- clear Neck- supple, no JVP Lymph- no cervical lymphadenopathy Lungs- Clear to ausculation bilaterally, normal work of breathing Heart- Irregular rate and rhythm, no murmurs, rubs or gallops, PMI not laterally displaced GI- soft, NT, ND, + BS Extremities- no clubbing, cyanosis,  1+ edema, improved from recent 2+ edema MS- no significant deformity or atrophy Skin- no rash or lesion Psych- euthymic mood, full affect Neuro- strength and sensation are intact  EKG- Afib, rate controlled at 88 bpm with aberrantly conducted beats or pvc's. Qrs int 90 ms,  Qtc 481 ms. Epic records reviewed. Labs reviewed  Assessment and Plan: 1. Persistent symptomatic afib Will schedule DCCV for Monday 11/7. Continue amiodarone 200 mg qd There was a red flag on adding amiodarone with recent iodine contrast allergy, but discussed with pharmD, and she states that this should  not be a  contraindication.  Will continue usual metoprolol for now. Continue apixaban for chadsvasc score of at least 4, has not missed any doses Repeat bmet/mag today with recent increase in diuretic  2. Non Hodgkns B cell lymphoma S/p chemotherapy In remission  3. Diastolic heart failure Fluid status improved with recent extra lasix of 80 mg x 3 days Return to 40 am and 20 mg pm DCCV pending   4. Elevated TSH Pending f/u with PCP 11/16, with probable adjustment of thyoid replacement hormone   F/u afib clinic one week after cardioversion  Butch Penny C. , [facility] [address] [place], [place] [address] [phone]

## 2014-11-27 NOTE — Patient Instructions (Signed)
Your physician has recommended you make the following change in your medication:  1)Lasix -- 40mg  in the AM and 20mg  in the PM  Cardioversion scheduled for Monday, November 7th  - Arrive at the [facility] and go to admitting at [facility] not eat or drink anything after midnight the night prior to your procedure.  - Take all your medication with a sip of water prior to arrival.  - You will not be able to drive home after your procedure.

## 2014-11-30 ENCOUNTER — Encounter (HOSPITAL_COMMUNITY): Payer: Self-pay | Admitting: *Deleted

## 2014-11-30 ENCOUNTER — Ambulatory Visit (HOSPITAL_COMMUNITY)
Admission: RE | Admit: 2014-11-30 | Discharge: 2014-11-30 | Disposition: A | Discharge status: Home / Self Care | Payer: Medicare HMO | Source: Ambulatory Visit | Attending: Cardiology | Admitting: Cardiology

## 2014-11-30 ENCOUNTER — Encounter (HOSPITAL_COMMUNITY)
Admission: RE | Disposition: A | Discharge status: Home / Self Care | Payer: Self-pay | Source: Ambulatory Visit | Attending: Cardiology

## 2014-11-30 ENCOUNTER — Ambulatory Visit (HOSPITAL_COMMUNITY): Payer: Medicare HMO | Admitting: Anesthesiology

## 2014-11-30 DIAGNOSIS — Z7901 Long term (current) use of anticoagulants: Secondary | ICD-10-CM | POA: Diagnosis not present

## 2014-11-30 DIAGNOSIS — Z9221 Personal history of antineoplastic chemotherapy: Secondary | ICD-10-CM | POA: Insufficient documentation

## 2014-11-30 DIAGNOSIS — Z8572 Personal history of non-Hodgkin lymphomas: Secondary | ICD-10-CM | POA: Insufficient documentation

## 2014-11-30 DIAGNOSIS — E785 Hyperlipidemia, unspecified: Secondary | ICD-10-CM | POA: Diagnosis not present

## 2014-11-30 DIAGNOSIS — I10 Essential (primary) hypertension: Secondary | ICD-10-CM | POA: Insufficient documentation

## 2014-11-30 DIAGNOSIS — E039 Hypothyroidism, unspecified: Secondary | ICD-10-CM | POA: Diagnosis not present

## 2014-11-30 DIAGNOSIS — I5032 Chronic diastolic (congestive) heart failure: Secondary | ICD-10-CM | POA: Insufficient documentation

## 2014-11-30 DIAGNOSIS — Z79899 Other long term (current) drug therapy: Secondary | ICD-10-CM | POA: Insufficient documentation

## 2014-11-30 DIAGNOSIS — I4891 Unspecified atrial fibrillation: Secondary | ICD-10-CM | POA: Diagnosis not present

## 2014-11-30 HISTORY — PX: CARDIOVERSION: SHX1299

## 2014-11-30 SURGERY — CARDIOVERSION
Anesthesia: General

## 2014-11-30 MED ORDER — LIDOCAINE HCL (CARDIAC) 20 MG/ML IV SOLN
INTRAVENOUS | Status: DC | PRN
  Administered 2014-11-30: 60 mg via INTRAVENOUS

## 2014-11-30 MED ORDER — SODIUM CHLORIDE 0.9 % IV SOLN
INTRAVENOUS | Status: DC
Start: 2014-11-30 — End: 2014-11-30
  Administered 2014-11-30: 13:00:00 via INTRAVENOUS

## 2014-11-30 MED ORDER — PROPOFOL 10 MG/ML IV BOLUS
INTRAVENOUS | Status: DC | PRN
  Administered 2014-11-30: 60 mg via INTRAVENOUS

## 2014-11-30 MED ORDER — SODIUM CHLORIDE 0.9 % IV SOLN
INTRAVENOUS | Status: DC | PRN
  Administered 2014-11-30: 13:00:00 via INTRAVENOUS

## 2014-11-30 NOTE — Discharge Instructions (Signed)
Electrical Cardioversion, Care After °Refer to this sheet in the next few weeks. These instructions provide you with information on caring for yourself after your procedure. Your health care provider may also give you more specific instructions. Your treatment has been planned according to current medical practices, but problems sometimes occur. Call your health care provider if you have any problems or questions after your procedure. °WHAT TO EXPECT AFTER THE PROCEDURE °After your procedure, it is typical to have the following sensations: °· Some redness on the skin where the shocks were delivered. If this is tender, a sunburn lotion or hydrocortisone cream may help. °· Possible return of an abnormal heart rhythm within hours or days after the procedure. °HOME CARE INSTRUCTIONS °· Take medicines only as directed by your health care provider. Be sure you understand how and when to take your medicine. °· Learn how to feel your pulse and check it often. °· Limit your activity for 48 hours after the procedure or as directed by your health care provider. °· Avoid or minimize caffeine and other stimulants as directed by your health care provider. °SEEK MEDICAL CARE IF: °· You feel like your heart is beating too fast or your pulse is not regular. °· You have any questions about your medicines. °· You have bleeding that will not stop. °SEEK IMMEDIATE MEDICAL CARE IF: °· You are dizzy or feel faint. °· It is hard to breathe or you feel short of breath. °· There is a change in discomfort in your chest. °· Your speech is slurred or you have trouble moving an arm or leg on one side of your body. °· You get a serious muscle cramp that does not go away. °· Your fingers or toes turn cold or blue. °  °This information is not intended to replace advice given to you by your health care provider. Make sure you discuss any questions you have with your health care provider. °  °Document Released: 10/30/2012 Document Revised: 01/30/2014  Document Reviewed: 10/30/2012 °Elsevier Interactive Patient Education ©2016 [employer] ° °

## 2014-11-30 NOTE — H&P (View-Only) (Signed)
Patient ID: Amanda Sellers, female   DOB: 07/14/1932, 81 y.o.   MRN: [id number]           Primary Care Physician: MCGOWEN,PHILIP H, MD Referring Physician: Dr. Harding   Amanda Sellers is a 81 y.o. female with a h/o  diastolic heart failure, afib, newly diagnosed with recent dx of lymphoma. She was admitted to [facility] 9/22 on IV dilt and given diuretics for acute on chronic systolic HF. It was arranged for to undergo TEE DCCV.  She had exertional dyspnea with PND & orthopnea.She felt very tired & had little energy. She  noted a few palpitations, but does not recall rapid irregular heart rates/rhythms.   She had TEE without thrombus and underwent DCCV that was successful with one shock of 120J. She did well post procedure and was seen by Dr. M. Croitoru and found stable for discharge. She was negative 2700cc since admit and weight was down 4 lbs with IV diuretics. D/C wt is 168.  Plan on d/c was for  uninterrupted anticoagulation for next 30 days, preferably lifelong barring bleeding complications. AFib clinic follow up in 1-2 weeks. Outpatient Lexiscan Myoview, non-urgent. .ASA stopped.   She was seen in the afib clinic, 10/10 and found to be in afib with rvr. She went into afib short time after cardioversion. EKG showed afib at 130-140 bpm. She was  not on any drugs for rate control.  Is taking DOAC without fail. Continues with chemotherapy for lymphoma.   She was started on BB and  Returned to clinic 10/12 with better v rates. 80's with sitting and 90-100's with activity. She does not have any PND/orthopnea although she has slept better in her recliner for months now.No lower extremity edema. Her weight is stable at home.  She has just finished chemotherapy for non- hodgkin's lymphoma and had f/u Pet scan 10/26,  and CA appears to be in remission, so per pt, no more chemo for right now. She has been on DOAC now x one month so will start attempts to restore SR. Discussed options  amiodarone or tikosyn and she would prefer amiodarone as she has two neighbors on amiodarone and they are both in rhythm and tolerating drug well. She is not interested in a hospital stay for drug loading. She reports rash with recent contrast dye. Amiodarone was started 200 mg bid, to then go down to 200 mg a day and then would discuss cardioversion.  She asked to be seen 11/2,  in the afib clinic for increased shortness of breath yesterday and last night. She has slept in the recliner for months but did not feel as well last night and slept poorly, feeling more short of breath and fullness in her chest.She has noticed increased pedal edema. She did not take her amiodarone last night or this am. Ekg shows rate controlled afib at 82 bpm, qtc at 464 ms.PO on RA is 98%. Weight is up 2 lbs from last visit, 6 lbs since 10/17. Lasix dose was decreased to 40 mg daily from 40 mg am and 20 mg pm when seen by Dr. Harding 10/22.. Pt has noticed weight climbing for several days. She is fairly comfortable when quiet and sitting.   She was asked to double lasix to 80 mg  x 3 days, and to reduce amiodarone 200 mg a day. She has lost 3 pounds on return to clinic. Clinically, she is much improved with more energy and less shortness of breath. Able to sleep   well. Had enough energy to walk into clinic today without wheelchair and felt well enough to clean her house yesterday. Ideally,I would like to load amiodarone a while longer but will go ahead and  pursue DCCV sooner than later, as it is contributing to her heart failure. Compliant with blood thinner without missed doses On recent labs, TSH was elevated but daughter explained that she just started taking replacement correctly on an empty stomach, and sees PCP 11/16 and she expects him to adjust the dose.  Today, she denies symptoms of palpitations, chest pain,  orthopnea, PND, mild lower extremity edema, no dizziness, presyncope, syncope, or neurologic sequela. Fatigue and  mild dyspnea. The patient is tolerating medications without difficulties and is otherwise without complaint today.   Past Medical History  Diagnosis Date  . Vitamin B 12 deficiency   . HTN (hypertension)   . Vitamin D deficiency   . HLD (hyperlipidemia)      06/2014  . Chronic diastolic congestive heart failure (HCC)     Echo 07/23/14: mild LVH, EF 50-55%, grade 2 diast dysfxn  . Microcytic anemia     transfused 3 U total in hosp 07/2014  . Positive occult stool blood test 08/08/14    Dr. Magod to do endoscopies as of 09/07/14  . Hypothyroidism   . Pancytopenia due to antineoplastic chemotherapy (HCC)   . Atrial fibrillation (HCC)     With RVR  . Anticoagulation adequate, Eliquis 10/16/2014  . Chronic diastolic congestive heart failure (HCC) 10/16/2014    Complicated by atrial fibrillation  . Anxiety   . Pulmonary metastases (HCC) 07/24/2014  . Follicular lymphoma (HCC)     Non Hodgkins B cell lymphoma;    Cycle 1 bendamustine/rituximab 08/04/2014   Past Surgical History  Procedure Laterality Date  . Breast biopsy    . Bone marrow biopsy  07/24/14  . Abdominal hysterectomy  1978  . Transthoracic echocardiogram  07/23/14    mild LVH, EF 50-55%, grade 2 diast dysfxn  . Upper gi endoscopy  09/23/2014    small hiatus hernia, nodules in stomach biopsied (chronic active erosive atrophic gastritis with intestinal metaplasia--no dysplasia or malignancy) otherwise normal  . Colonoscopy  09/23/14    small hemorrhoids, otherwise normal (performed for IDA and heme+ stool)  . Tee without cardioversion N/A 10/16/2014    Procedure: TRANSESOPHAGEAL ECHOCARDIOGRAM (TEE);  Surgeon: Brian S Crenshaw, MD;  Location: [facility] END[facility]COPY;  Service: Cardiovascular;  Laterality: N/A;  . Cardioversion N/A 10/16/2014    Procedure: CARDIOVERSION;  Surgeon: Brian S Crenshaw, MD;  Location: [facility] ENDOSCOPY;  Service: Cardiovascular;  Laterality: N/A;  . Nm myoview ltd  10/29/2014    medium size mild surgery defect in the mid  anterior and apical anterior location suggestive of breast attenuation. No reversibility. LOW RISK    Current Outpatient Prescriptions  Medication Sig Dispense Refill  . ALPRAZolam (XANAX) 0.5 MG tablet Take 1 tablet by mouth every 6 (six) hours as needed. anxiety    . amiodarone (PACERONE) 200 MG tablet Take 1 tablet (200 mg total) by mouth daily. 60 tablet 3  . apixaban (ELIQUIS) 5 MG TABS tablet Take 1 tablet (5 mg total) by mouth 2 (two) times daily. 56 tablet 0  . ergocalciferol (VITAMIN D2) 50000 UNITS capsule Take 50,000 Units by mouth once a week. On Sundays    . ferrous sulfate 325 (65 FE) MG tablet Take 1 tablet (325 mg total) by mouth 2 (two) times daily with a meal. 30 tablet 1  .   fish oil-omega-3 fatty acids 1000 MG capsule Take 2 g by mouth daily.    . furosemide (LASIX) 40 MG tablet Take 40mg by mouth in AM and take 20mg by mouth in PM 90 tablet 3  . levothyroxine (SYNTHROID, LEVOTHROID) 100 MCG tablet Take 1 tablet (100 mcg total) by mouth daily before breakfast. 30 tablet 3  . metoprolol succinate (TOPROL-XL) 50 MG 24 hr tablet Take 1 tablet (50 mg total) by mouth 2 (two) times daily. Take with or immediately following a meal. 180 tablet 3  . OXYGEN Inhale into the lungs. 1Lt at bedtime    . potassium chloride 20 MEQ TBCR Take 20 mEq by mouth daily. 30 tablet 3  . pravastatin (PRAVACHOL) 80 MG tablet Take 80 mg by mouth daily.    . PRESCRIPTION MEDICATION Chemo. Last dose was 10-02-14 at [facility].    . prochlorperazine (COMPAZINE) 10 MG tablet Take 1 tablet by mouth 3 (three) times daily before meals.     . prochlorperazine (COMPAZINE) 5 MG tablet Take 1 tablet (5 mg total) by mouth every 6 (six) hours as needed for nausea or vomiting. 30 tablet 1   No current facility-administered medications for this encounter.    Allergies  Allergen Reactions  . Iodinated Diagnostic Agents Rash    CT Contrast    Social History   Social History  . Marital Status: Married    Spouse Name:  N/A  . Number of Children: 8  . Years of Education: N/A   Occupational History  . retired    Social History Main Topics  . Smoking status: Never Smoker   . Smokeless tobacco: Never Used  . Alcohol Use: No  . Drug Use: No  . Sexual Activity: Not on file   Other Topics Concern  . Not on file   Social History Narrative   Widowed, has 8 children.   Orig from [place], now lives in [place].   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   No tob/alc/drugs.    Family History  Problem Relation Age of Onset  . Melanoma Mother   . Stomach cancer Father   . Lymphoma Sister   . Diabetes Daughter     ROS- All systems are reviewed and negative except as per the HPI above  Physical Exam: Filed Vitals:   11/27/14 1052  BP: 122/70  Pulse: 88  Height: 5' 5" (1.651 m)  Weight: 174 lb 3.2 oz (79.017 kg)    GEN- The patient is well appearing, alert and oriented x 3 today.   Head- normocephalic, atraumatic Eyes-  Sclera clear, conjunctiva pink Ears- hearing intact Oropharynx- clear Neck- supple, no JVP Lymph- no cervical lymphadenopathy Lungs- Clear to ausculation bilaterally, normal work of breathing Heart- Irregular rate and rhythm, no murmurs, rubs or gallops, PMI not laterally displaced GI- soft, NT, ND, + BS Extremities- no clubbing, cyanosis,  1+ edema, improved from recent 2+ edema MS- no significant deformity or atrophy Skin- no rash or lesion Psych- euthymic mood, full affect Neuro- strength and sensation are intact  EKG- Afib, rate controlled at 88 bpm with aberrantly conducted beats or pvc's. Qrs int 90 ms,  Qtc 481 ms. Epic records reviewed. Labs reviewed  Assessment and Plan: 1. Persistent symptomatic afib Will schedule DCCV for Monday 11/7. Continue amiodarone 200 mg qd There was a red flag on adding amiodarone with recent iodine contrast allergy, but discussed with pharmD, and she states that this should   not be a  contraindication.  Will continue usual metoprolol for now. Continue apixaban for chadsvasc score of at least 4, has not missed any doses Repeat bmet/mag today with recent increase in diuretic  2. Non Hodgkns B cell lymphoma S/p chemotherapy In remission  3. Diastolic heart failure Fluid status improved with recent extra lasix of 80 mg x 3 days Return to 40 am and 20 mg pm DCCV pending   4. Elevated TSH Pending f/u with PCP 11/16, with probable adjustment of thyoid replacement hormone   F/u afib clinic one week after cardioversion  Butch Penny C. Jonael Paradiso, [facility] [address] [place], [place] [address] [phone]

## 2014-11-30 NOTE — Anesthesia Preprocedure Evaluation (Signed)
Anesthesia Evaluation  Patient identified by MRN, date of birth, ID band Patient awake    Reviewed: Allergy & Precautions, NPO status , Patient's Chart, lab work & pertinent test results  Airway Mallampati: II  TM Distance: >3 FB Neck ROM: Full    Dental  (+) Teeth Intact   Pulmonary shortness of breath,    breath sounds clear to auscultation       Cardiovascular hypertension, +CHF  + dysrhythmias Atrial Fibrillation  Rhythm:Irregular Rate:Normal     Neuro/Psych negative neurological ROS  negative psych ROS   GI/Hepatic negative GI ROS, Neg liver ROS,   Endo/Other  negative endocrine ROSHypothyroidism   Renal/GU negative Renal ROS     Musculoskeletal negative musculoskeletal ROS (+)   Abdominal   Peds  Hematology negative hematology ROS (+) anemia ,   Anesthesia Other Findings   Reproductive/Obstetrics negative OB ROS                             Anesthesia Physical  Anesthesia Plan  ASA: III  Anesthesia Plan: General   Post-op Pain Management:    Induction:   Airway Management Planned: Mask  Additional Equipment:   Intra-op Plan:   Post-operative Plan:   Informed Consent: I have reviewed the patients History and Physical, chart, labs and discussed the procedure including the risks, benefits and alternatives for the proposed anesthesia with the patient or authorized representative who has indicated his/her understanding and acceptance.     Plan Discussed with: CRNA and Anesthesiologist  Anesthesia Plan Comments:         Anesthesia Quick Evaluation

## 2014-11-30 NOTE — Transfer of Care (Signed)
Immediate Anesthesia Transfer of Care Note  Patient: Amanda Sellers  Procedure(s) Performed: Procedure(s): CARDIOVERSION (N/A)  Patient Location: Endoscopy Unit  Anesthesia Type:General  Level of Consciousness: awake and alert   Airway & Oxygen Therapy: Patient Spontanous Breathing  Post-op Assessment: Report given to RN, Post -op Vital signs reviewed and stable and Patient moving all extremities X 4  Post vital signs: Reviewed and stable  Last Vitals:  Filed Vitals:   11/30/14 1304  BP: 128/64  Pulse: 87  Resp: 18    Complications: No apparent anesthesia complications

## 2014-11-30 NOTE — Anesthesia Postprocedure Evaluation (Signed)
Anesthesia Post Note  Patient: Amanda Sellers  Procedure(s) Performed: Procedure(s) (LRB): CARDIOVERSION (N/A)  Anesthesia type: General  Patient location: PACU  Post pain: Pain level controlled  Post assessment: Post-op Vital signs reviewed  Last Vitals: BP 91/33 mmHg  Pulse 55  Resp 16  SpO2 97%  Post vital signs: Reviewed  Level of consciousness: sedated  Complications: No apparent anesthesia complications

## 2014-11-30 NOTE — Interval H&P Note (Signed)
History and Physical Interval Note:  11/30/2014 2:01 PM  Amanda Sellers  has presented today for surgery, with the diagnosis of AFIB  The various methods of treatment have been discussed with the patient and family. After consideration of risks, benefits and other options for treatment, the patient has consented to  Procedure(s): CARDIOVERSION (N/A) as a surgical intervention .  The patient's history has been reviewed, patient examined, no change in status, stable for surgery.  I have reviewed the patient's chart and labs.  Questions were answered to the patient's satisfaction.     Erum Cercone [employer]

## 2014-11-30 NOTE — Anesthesia Procedure Notes (Signed)
Procedure Name: MAC Date/Time: 11/30/2014 2:00 PM Performed by: Tressia Miners LEFFEW Pre-anesthesia Checklist: Patient identified, Emergency Drugs available, Suction available and Patient being monitored Patient Re-evaluated:Patient Re-evaluated prior to inductionOxygen Delivery Method: Ambu bag Preoxygenation: Pre-oxygenation with 100% oxygen Intubation Type: IV induction Placement Confirmation: positive ETCO2 and breath sounds checked- equal and bilateral Dental Injury: Teeth and Oropharynx as per pre-operative assessment

## 2014-11-30 NOTE — Procedures (Signed)
Electrical Cardioversion Procedure Note Amanda Sellers [id number] August 26, 1932  Procedure: Electrical Cardioversion Indications:  Atrial Fibrillation  Procedure Details Consent: Risks of procedure as well as the alternatives and risks of each were explained to the (patient/caregiver).  Consent for procedure obtained. Time Out: Verified patient identification, verified procedure, site/side was marked, verified correct patient position, special equipment/implants available, medications/allergies/relevent history reviewed, required imaging and test results available.  Performed  Patient placed on cardiac monitor, pulse oximetry, supplemental oxygen as necessary.  Sedation given: Propofol per anesthesiology Pacer pads placed anterior and posterior chest.  Cardioverted 1 time(s).  Cardioverted at 150J.  Evaluation Findings: Post procedure EKG shows: NSR Complications: None Patient did tolerate procedure well.   [name] 11/30/2014, 2:09 PM

## 2014-12-01 ENCOUNTER — Encounter (HOSPITAL_COMMUNITY): Payer: Self-pay | Admitting: Cardiology

## 2014-12-07 ENCOUNTER — Encounter (HOSPITAL_COMMUNITY): Payer: Self-pay | Admitting: Nurse Practitioner

## 2014-12-07 ENCOUNTER — Ambulatory Visit (HOSPITAL_COMMUNITY)
Admission: RE | Admit: 2014-12-07 | Discharge: 2014-12-07 | Disposition: A | Discharge status: Home / Self Care | Payer: Medicare HMO | Source: Ambulatory Visit | Attending: Nurse Practitioner | Admitting: Nurse Practitioner

## 2014-12-07 VITALS — BP 122/64 | HR 58 | Ht 65.0 in | Wt 178.8 lb

## 2014-12-07 DIAGNOSIS — E039 Hypothyroidism, unspecified: Secondary | ICD-10-CM | POA: Insufficient documentation

## 2014-12-07 DIAGNOSIS — I481 Persistent atrial fibrillation: Secondary | ICD-10-CM | POA: Diagnosis present

## 2014-12-07 DIAGNOSIS — I4891 Unspecified atrial fibrillation: Secondary | ICD-10-CM | POA: Diagnosis not present

## 2014-12-07 DIAGNOSIS — I5032 Chronic diastolic (congestive) heart failure: Secondary | ICD-10-CM | POA: Insufficient documentation

## 2014-12-07 DIAGNOSIS — C8590 Non-Hodgkin lymphoma, unspecified, unspecified site: Secondary | ICD-10-CM | POA: Diagnosis not present

## 2014-12-07 NOTE — Progress Notes (Signed)
Patient ID: Amanda Sellers, female   DOB: [birth year], 81 y.o.   MRN: [id number] .a Patient ID: Amanda Sellers, female   DOB: 21-Sep-1932, 81 y.o.   MRN: [id number]           Primary Care Physician: Tammi Sou, MD Referring Physician: Dr. Ronni Rumble Amanda Sellers is a 81 y.o. female with a h/o  diastolic heart failure, afib, newly diagnosed with recent dx of lymphoma. She was admitted to [facility] 9/22 on IV dilt and given diuretics for acute on chronic systolic HF. It was arranged for to undergo TEE DCCV.  She had exertional dyspnea with PND & orthopnea.She felt very tired & had little energy. She  noted a few palpitations, but does not recall rapid irregular heart rates/rhythms.   She had TEE without thrombus and underwent DCCV that was successful with one shock of 120J. She did well post procedure and was seen by Dr. Jerilynn Mages. Croitoru and found stable for discharge. She was negative 2700cc since admit and weight was down 4 lbs with IV diuretics. D/C wt is 168.  Plan on d/c was for  uninterrupted anticoagulation for next 30 days, preferably lifelong barring bleeding complications. AFib clinic follow up in 1-2 weeks. Outpatient [facility], non-urgent. .ASA stopped.   She was seen in the afib clinic, 10/10 and found to be in afib with rvr. She went into afib short time after cardioversion. EKG showed afib at 130-140 bpm. She was  not on any drugs for rate control.  Is taking DOAC without fail. Continues with chemotherapy for lymphoma.   She was started on BB and  returned to clinic 10/12 with better v rates. 80's with sitting and 90-100's with activity. She does not have any PND/orthopnea although she has slept better in her recliner for months now.No lower extremity edema. Her weight is stable at home.  She has  finished chemotherapy for non- hodgkin's lymphoma and had f/u Pet scan 10/26,  and CA appears to be in remission, so per pt, no more chemo for right now. She has been  on DOAC now x one month so will start attempts to restore SR. Discussed options amiodarone or tikosyn and she would prefer amiodarone as she has two neighbors on amiodarone and they are both in rhythm and tolerating drug well. She is not interested in a hospital stay for drug loading. She reports rash with recent contrast dye. Amiodarone was started 200 mg bid, to then go down to 200 mg a day and then would discuss cardioversion.  She asked to be seen 11/2,  in the afib clinic for increased shortness of breath yesterday and last night. She has slept in the recliner for months but did not feel as well last night and slept poorly, feeling more short of breath and fullness in her chest.She has noticed increased pedal edema. She did not take her amiodarone last night or this am. Ekg shows rate controlled afib at 82 bpm, qtc at 464 ms.PO on RA is 98%. Weight is up 2 lbs from last visit, 6 lbs since 10/17. Lasix dose was decreased to 40 mg daily from 40 mg am and 20 mg pm when seen by Dr. Ellyn Hack 10/22.. Pt has noticed weight climbing for several days. She is fairly comfortable when quiet and sitting.   She was asked to double lasix to 80 mg  x 3 days, and to reduce amiodarone 200 mg a day. She had lost 3 pounds on return to  clinic several days later. Clinically, she is much improved with more energy and less shortness of breath. Able to sleep well. Had enough energy to walk into clinic today without wheelchair and felt well enough to clean her house yesterday. Ideally,I would like to load amiodarone a while longer but will go ahead and  pursue DCCV sooner than later, as it is contributing to her heart failure. Compliant with blood thinner without missed doses On recent labs, TSH was elevated but daughter explained that she just started taking replacement correctly on an empty stomach, and sees PCP 11/16 and she expects him to adjust the dose.  11/14-  Returns to afib clinic with a smile on her face and reporting  all the activities she has been doing over the last week, having returned to sinus rhythm after cardioversion. Much more energy and breathing is much improved.  Fluid status is stable.  Today, she denies symptoms of palpitations, chest pain,  orthopnea, PND, mild lower extremity edema, no dizziness, presyncope, syncope, or neurologic sequela. Fatigue and mild dyspnea. The patient is tolerating medications without difficulties and is otherwise without complaint today.   Past Medical History  Diagnosis Date  . Vitamin B 12 deficiency   . HTN (hypertension)   . Vitamin D deficiency   . HLD (hyperlipidemia)      06/2014  . Chronic diastolic congestive heart failure ([place])     Echo 07/23/14: mild LVH, EF 50-55%, grade 2 diast dysfxn  . Microcytic anemia     transfused 3 U total in hosp 07/2014  . Positive occult stool blood test 08/08/14    Dr. Watt Climes to do endoscopies as of 09/07/14  . Hypothyroidism   . Pancytopenia due to antineoplastic chemotherapy ([place])   . Atrial fibrillation ([place])     With RVR; elec cardioversion 11/30/14  . Anticoagulation adequate, Eliquis 10/16/2014  . Chronic diastolic congestive heart failure ([place]) 05/01/8117    Complicated by atrial fibrillation  . Anxiety   . Pulmonary metastases ([place]) 07/24/2014  . Follicular lymphoma ([place])     Non Hodgkins B cell lymphoma;    Cycle 1 bendamustine/rituximab 08/04/2014   Past Surgical History  Procedure Laterality Date  . Breast biopsy    . Bone marrow biopsy  07/24/14  . Abdominal hysterectomy  1978  . Transthoracic echocardiogram  07/23/14    mild LVH, EF 50-55%, grade 2 diast dysfxn  . Upper gi endoscopy  09/23/2014    small hiatus hernia, nodules in stomach biopsied (chronic active erosive atrophic gastritis with intestinal metaplasia--no dysplasia or malignancy) otherwise normal  . Colonoscopy  09/23/14    small hemorrhoids, otherwise normal (performed for IDA and heme+ stool)  . Tee without cardioversion N/A 10/16/2014     Procedure: TRANSESOPHAGEAL ECHOCARDIOGRAM (TEE);  Surgeon: Lelon Perla, MD;  Location: [place];  Service: Cardiovascular;  Laterality: N/A;  . Cardioversion N/A 10/16/2014    Procedure: CARDIOVERSION;  Surgeon: Lelon Perla, MD;  Location: [facility] ENDOSCOPY;  Service: Cardiovascular;  Laterality: N/A;  . Nm myoview ltd  10/29/2014    medium size mild surgery defect in the mid anterior and apical anterior location suggestive of breast attenuation. No reversibility. LOW RISK  . Cardioversion N/A 11/30/2014    Procedure: CARDIOVERSION;  Surgeon: Larey Dresser, MD;  Location: [place];  Service: Cardiovascular;  Laterality: N/A;    Current Outpatient Prescriptions  Medication Sig Dispense Refill  . ALPRAZolam (XANAX) 0.5 MG tablet Take 1 tablet by mouth every 6 (six) hours  as needed. anxiety    . amiodarone (PACERONE) 200 MG tablet Take 1 tablet (200 mg total) by mouth daily. 60 tablet 3  . apixaban (ELIQUIS) 5 MG TABS tablet Take 1 tablet (5 mg total) by mouth 2 (two) times daily. 56 tablet 0  . ergocalciferol (VITAMIN D2) 50000 UNITS capsule Take 50,000 Units by mouth once a week. On Sundays    . ferrous sulfate 325 (65 FE) MG tablet Take 1 tablet (325 mg total) by mouth 2 (two) times daily with a meal. 30 tablet 1  . fish oil-omega-3 fatty acids 1000 MG capsule Take 2 g by mouth daily.    . furosemide (LASIX) 40 MG tablet Take 41m by mouth in AM and take 246mby mouth in PM 90 tablet 3  . levothyroxine (SYNTHROID, LEVOTHROID) 100 MCG tablet Take 1 tablet (100 mcg total) by mouth daily before breakfast. 30 tablet 3  . metoprolol succinate (TOPROL-XL) 50 MG 24 hr tablet Take 1 tablet (50 mg total) by mouth 2 (two) times daily. Take with or immediately following a meal. 180 tablet 3  . OXYGEN Inhale into the lungs. 1Lt at bedtime    . potassium chloride 20 MEQ TBCR Take 20 mEq by mouth daily. 30 tablet 3  . pravastatin (PRAVACHOL) 80 MG tablet Take 80 mg by mouth daily.    . Marland KitchenPRESCRIPTION MEDICATION Chemo. [facility]   . Marland Kitchenrochlorperazine (COMPAZINE) 10 MG tablet Take 1 tablet by mouth 3 (three) times daily before meals.     . prochlorperazine (COMPAZINE) 5 MG tablet Take 1 tablet (5 mg total) by mouth every 6 (six) hours as needed for nausea or vomiting. 30 tablet 1   No current facility-administered medications for this encounter.    Allergies  Allergen Reactions  . Iodinated Diagnostic Agents Rash    CT Contrast    Social History   Social History  . Marital Status: Married    Spouse Name: N/A  . Number of Children: 8  . Years of Education: N/A   Occupational History  . retired    Social History Main Topics  . Smoking status: Never Smoker   . Smokeless tobacco: Never Used  . Alcohol Use: No  . Drug Use: No  . Sexual Activity: Not on file   Other Topics Concern  . Not on file   Social History Narrative   Widowed, has 8 children.   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   No tob/alc/drugs.    Family History  Problem Relation Age of Onset  . Melanoma Mother   . Stomach cancer Father   . Lymphoma Sister   . Diabetes Daughter     ROS- All systems are reviewed and negative except as per the HPI above  Physical Exam: Filed Vitals:   12/07/14 1101  BP: 122/64  Pulse: 58  Height: 5' 5" (1.651 m)  Weight: 178 lb 12.8 oz (81.103 kg)    GEN- The patient is well appearing, alert and oriented x 3 today.   Head- normocephalic, atraumatic Eyes-  Sclera clear, conjunctiva pink Ears- hearing intact Oropharynx- clear Neck- supple, no JVP Lymph- no cervical lymphadenopathy Lungs- Clear to ausculation bilaterally, normal work of breathing Heart-Regular rate and rhythm, no murmurs, rubs or gallops, PMI not laterally displaced GI- soft, NT, ND, + BS Extremities- no clubbing, cyanosis,  1+ edema, improved  from recent 2+ edema MS- no  significant deformity or atrophy Skin- no rash or lesion Psych- euthymic mood, full affect Neuro- strength and sensation are intact  EKG- SR with first degree av block, pr int 234 ms, qrs int 90 ms, 449 ms Epic records reviewed. Labs reviewed  Assessment and Plan: 1. Persistent symptomatic afib  Successful cardioversion 11/17, maintaining  SR Continue amiodarone 200 mg qd Continue  metoprolol for now. Continue apixaban for chadsvasc score of at least 4, has not missed any doses  2. Non Hodgkns B cell lymphoma S/p chemotherapy In remission  3. Diastolic heart failure Continue Lasix 40 am and 20 mg pm Fluid status stable Symptoms improved with return to SR  4. Elevated TSH Pending f/u with PCP 11/16, with probable adjustment of thyoid replacement hormone   F/u afib clinic one month,will schedule baseline PFT's at that time.  Geroge Baseman Gatha Mcnulty, [facility] [address] [place], [place] [address] [phone]

## 2014-12-09 ENCOUNTER — Encounter: Payer: Self-pay | Admitting: Family Medicine

## 2014-12-09 ENCOUNTER — Ambulatory Visit (INDEPENDENT_AMBULATORY_CARE_PROVIDER_SITE_OTHER): Payer: Medicare HMO | Admitting: Family Medicine

## 2014-12-09 VITALS — BP 134/82 | HR 95 | Temp 97.5°F | Resp 16 | Ht 66.0 in | Wt 179.0 lb

## 2014-12-09 DIAGNOSIS — I5032 Chronic diastolic (congestive) heart failure: Secondary | ICD-10-CM

## 2014-12-09 DIAGNOSIS — I10 Essential (primary) hypertension: Secondary | ICD-10-CM

## 2014-12-09 DIAGNOSIS — D509 Iron deficiency anemia, unspecified: Secondary | ICD-10-CM

## 2014-12-09 DIAGNOSIS — R195 Other fecal abnormalities: Secondary | ICD-10-CM

## 2014-12-09 DIAGNOSIS — R7301 Impaired fasting glucose: Secondary | ICD-10-CM

## 2014-12-09 DIAGNOSIS — Z23 Encounter for immunization: Secondary | ICD-10-CM

## 2014-12-09 DIAGNOSIS — Z13220 Encounter for screening for lipoid disorders: Secondary | ICD-10-CM

## 2014-12-09 DIAGNOSIS — I4891 Unspecified atrial fibrillation: Secondary | ICD-10-CM

## 2014-12-09 LAB — COMPREHENSIVE METABOLIC PANEL
ALT: 17 U/L (ref 6–29)
AST: 20 U/L (ref 10–35)
Albumin: 3.8 g/dL (ref 3.6–5.1)
Alkaline Phosphatase: 68 U/L (ref 33–130)
BUN: 15 mg/dL (ref 7–25)
CO2: 32 mmol/L — ABNORMAL HIGH (ref 20–31)
Calcium: 8.8 mg/dL (ref 8.6–10.4)
Chloride: 100 mmol/L (ref 98–110)
Creat: 0.86 mg/dL (ref 0.60–0.88)
Glucose, Bld: 99 mg/dL (ref 65–99)
Potassium: 4.2 mmol/L (ref 3.5–5.3)
Sodium: 140 mmol/L (ref 135–146)
Total Bilirubin: 0.5 mg/dL (ref 0.2–1.2)
Total Protein: 6.1 g/dL (ref 6.1–8.1)

## 2014-12-09 LAB — LIPID PANEL
Cholesterol: 150 mg/dL (ref 125–200)
HDL: 49 mg/dL (ref >=46)
LDL Cholesterol: 74 mg/dL (ref <130)
Total CHOL/HDL Ratio: 3.1 Ratio (ref <=5.0)
Triglycerides: 135 mg/dL (ref <150)
VLDL: 27 mg/dL (ref <30)

## 2014-12-09 NOTE — Progress Notes (Signed)
OFFICE VISIT  12/09/2014   CC:  Chief Complaint  Patient presents with  . Follow-up    Pt is fasting.    HPI:    Patient is a 81 y.o. Caucasian female who presents for 3 mo f/u IFG, HTN, hx of a-fib with recent elec cardioversion. In remission from her lymphoma.   Recent f/u with cardiologist after elec cardioversion showed her to be in sinus rhythm. Most recent TSH was a touch elevated.  She has taken her levothyroxine correctly for at least the last 4 mo.  Feels good, no acute complaints. Compliant with all meds, trying to eat low Na diet.  Taking iron bid for hx of iron def anemia.  Her upper and lower endoscopies 09/23/14 by Dr. Watt Climes did not reveal a source of bleeding.   Her Hb has gradually come up with iron.  Past Medical History  Diagnosis Date  . Vitamin B 12 deficiency   . HTN (hypertension)   . Vitamin D deficiency   . HLD (hyperlipidemia)      06/2014  . Chronic diastolic congestive heart failure ([place])     Echo 07/23/14: mild LVH, EF 50-55%, grade 2 diast dysfxn  . Microcytic anemia     transfused 3 U total in hosp 07/2014  . Positive occult stool blood test 08/08/14    Endoscopies ok 08/2014  . Hypothyroidism   . Pancytopenia due to antineoplastic chemotherapy ([place])   . Atrial fibrillation ([place])     With RVR; elec cardioversion 11/30/14  . Anticoagulation adequate, Eliquis 10/16/2014  . Chronic diastolic congestive heart failure ([place]) 1/75/1025    Complicated by atrial fibrillation  . Anxiety   . Pulmonary metastases ([place]) 07/24/2014  . Follicular lymphoma ([place])     Non Hodgkins B cell lymphoma; s/p chemo summer 2016--in remission as of 11/2014.    Past Surgical History  Procedure Laterality Date  . Breast biopsy    . Bone marrow biopsy  07/24/14  . Abdominal hysterectomy  1978  . Transthoracic echocardiogram  07/23/14    mild LVH, EF 50-55%, grade 2 diast dysfxn  . Upper gi endoscopy  09/23/2014    small hiatus hernia, nodules in stomach biopsied (chronic  active erosive atrophic gastritis with intestinal metaplasia--no dysplasia or malignancy) otherwise normal  . Colonoscopy  09/23/14    small hemorrhoids, otherwise normal (performed for IDA and heme+ stool)  . Tee without cardioversion N/A 10/16/2014    Procedure: TRANSESOPHAGEAL ECHOCARDIOGRAM (TEE);  Surgeon: Lelon Perla, MD;  Location: [place];  Service: Cardiovascular;  Laterality: N/A;  . Cardioversion N/A 10/16/2014    Procedure: CARDIOVERSION;  Surgeon: Lelon Perla, MD;  Location[facility] ENDOSCOPY;  Service: Cardiovascular;  Laterality: N/A;  . Nm myoview ltd  10/29/2014    medium size mild surgery defect in the mid anterior and apical anterior location suggestive of breast attenuation. No reversibility. LOW RISK  . Cardioversion N/A 11/30/2014    Procedure: CARDIOVERSION;  Surgeon: Larey Dresser, MD;  Location: [place];  Service: Cardiovascular;  Laterality: N/A;    Outpatient Prescriptions Prior to Visit  Medication Sig Dispense Refill  . ALPRAZolam (XANAX) 0.5 MG tablet Take 1 tablet by mouth every 6 (six) hours as needed. anxiety    . amiodarone (PACERONE) 200 MG tablet Take 1 tablet (200 mg total) by mouth daily. 60 tablet 3  . apixaban (ELIQUIS) 5 MG TABS tablet Take 1 tablet (5 mg total) by mouth 2 (two) times daily. 56 tablet 0  .  ergocalciferol (VITAMIN D2) 50000 UNITS capsule Take 50,000 Units by mouth once a week. On Sundays    . ferrous sulfate 325 (65 FE) MG tablet Take 1 tablet (325 mg total) by mouth 2 (two) times daily with a meal. 30 tablet 1  . fish oil-omega-3 fatty acids 1000 MG capsule Take 2 g by mouth daily.    . furosemide (LASIX) 40 MG tablet Take 29m by mouth in AM and take 271mby mouth in PM 90 tablet 3  . levothyroxine (SYNTHROID, LEVOTHROID) 100 MCG tablet Take 1 tablet (100 mcg total) by mouth daily before breakfast. 30 tablet 3  . metoprolol succinate (TOPROL-XL) 50 MG 24 hr tablet Take 1 tablet (50 mg total) by mouth 2 (two) times daily.  Take with or immediately following a meal. 180 tablet 3  . OXYGEN Inhale into the lungs. 1Lt at bedtime    . potassium chloride 20 MEQ TBCR Take 20 mEq by mouth daily. 30 tablet 3  . pravastatin (PRAVACHOL) 80 MG tablet Take 80 mg by mouth daily.    . Marland KitchenRESCRIPTION MEDICATION Chemo. [facility]   . Marland Kitchenrochlorperazine (COMPAZINE) 5 MG tablet Take 1 tablet (5 mg total) by mouth every 6 (six) hours as needed for nausea or vomiting. 30 tablet 1  . prochlorperazine (COMPAZINE) 10 MG tablet Take 1 tablet by mouth 3 (three) times daily before meals.      No facility-administered medications prior to visit.    Allergies  Allergen Reactions  . Iodinated Diagnostic Agents Rash    CT Contrast    ROS As per HPI  PE: Blood pressure 134/82, pulse 95, temperature 97.5 F (36.4 C), temperature source Oral, resp. rate 16, height _0  (1.676 m), weight 179 lb (81.194 kg), SpO2 94 %. Gen: Alert, well appearing.  Patient is oriented to person, place, time, and situation. AFFECT: pleasant, lucid thought and speech. CV: RRR, no m/r/g.   LUNGS: CTA bilat, nonlabored resps, good aeration in all lung fields. EXT: no clubbing or cyanosis.  3+ bilat pitting edema present.  LABS:  Lab Results  Component Value Date   IRON 23* 07/22/2014   TIBC 291 07/22/2014   FERRITIN 428* 08/18/2014      Chemistry      Component Value Date/Time   NA 140 11/27/2014 1117   NA 143 11/17/2014 0801   K 3.9 11/27/2014 1117   K 3.6 11/17/2014 0801   CL 99* 11/27/2014 1117   CO2 35* 11/27/2014 1117   CO2 30* 11/17/2014 0801   BUN 11 11/27/2014 1117   BUN 16.3 11/17/2014 0801   CREATININE 0.82 11/27/2014 1117   CREATININE 0.7 11/17/2014 0801   CREATININE 0.72 11/13/2014 0908      Component Value Date/Time   CALCIUM 9.1 11/27/2014 1117   CALCIUM 8.9 11/17/2014 0801   ALKPHOS 71 11/25/2014 1115   ALKPHOS 70 11/17/2014 0801   AST 35 11/25/2014 1115   AST 32 11/17/2014 0801   ALT 28 11/25/2014  1115   ALT 30 11/17/2014 0801   BILITOT 0.8 11/25/2014 1115   BILITOT 0.55 11/17/2014 0801     Lab Results  Component Value Date   TSH 5.593* 11/25/2014   Lab Results  Component Value Date   WBC 3.7* 11/25/2014   HGB 10.3* 11/25/2014   HCT 32.5* 11/25/2014   MCV 88.8 11/25/2014   PLT 126* 11/25/2014   Lab Results  Component Value Date   HGBA1C 4.7 09/09/2014  IMPRESSION AND PLAN:  1) Follicular B cell lymphoma: in remission!  Pt feeling good.  2) Iron def anemia: upper and lower endoscopies both unrevealing/reassuring. Hb coming up with iron supplement.  Hard to tell how her chemo has impacted this lately. Will continue feso4 325 bid and recheck CBC at next f/u in 3 mo.  3) A-fib--now in sinus rhythm s/p electrical cardioversion. Continue amio and toprol + anticoag with eliquis.  Keep approp cardiology f/u.  4) Hypothyroidism: recent TSH borderline/slightly elevated.  I want to keep dose the same for now, esp since she feels good. Will recheck TSH at next f/u in 3 mo.  5) hx of IFG: her A1c when this was detected was excellent (see labs above). Recheck fasting glucose today.  6) Chronic diastolic HF: stable, no sign of volume overload today. She has stable chronic LE edema, wears light compression hose, eats low Na diet, and takes lasix qd. Will check lytes/cr today.  7) Hyperlipidemia: tolerating pravastatin.  Will check lipids today since she is fasting and I have no cholesterol panel on record for her.    8) Prev health care: Tdap given today.  An After Visit Summary was printed and given to the patient.  FOLLOW UP: Return in about 3 months (around 03/11/2015) for routine chronic illness f/u (30 min).

## 2014-12-09 NOTE — Progress Notes (Signed)
Pre visit review using our clinic review tool, if applicable. No additional management support is needed unless otherwise documented below in the visit note. 

## 2014-12-24 ENCOUNTER — Other Ambulatory Visit: Payer: Self-pay | Admitting: *Deleted

## 2014-12-24 MED ORDER — LEVOTHYROXINE SODIUM 100 MCG PO TABS: 100.0000 ug | ORAL_TABLET | Freq: Every day | ORAL | Status: DC

## 2014-12-24 NOTE — Telephone Encounter (Signed)
RF request for levothyroxine LOV: 12/09/14 NOV: 03/11/15 Last written: 09/10/14 #30 w/ 3RF 11/25/14 TSH 5.593  Please advise.Thanks.

## 2014-12-31 ENCOUNTER — Other Ambulatory Visit (HOSPITAL_BASED_OUTPATIENT_CLINIC_OR_DEPARTMENT_OTHER): Payer: Medicare HMO

## 2014-12-31 ENCOUNTER — Telehealth: Payer: Self-pay | Admitting: Oncology

## 2014-12-31 ENCOUNTER — Ambulatory Visit (HOSPITAL_BASED_OUTPATIENT_CLINIC_OR_DEPARTMENT_OTHER): Payer: Medicare HMO | Admitting: Oncology

## 2014-12-31 VITALS — BP 138/46 | HR 53 | Temp 97.9°F | Resp 18 | Ht 66.0 in | Wt 170.0 lb

## 2014-12-31 DIAGNOSIS — C8290 Follicular lymphoma, unspecified, unspecified site: Secondary | ICD-10-CM

## 2014-12-31 LAB — CBC WITH DIFFERENTIAL/PLATELET
BASO%: 0.5 % (ref 0.0–2.0)
Basophils Absolute: 0 10*3/uL (ref 0.0–0.1)
EOS%: 7.4 % — ABNORMAL HIGH (ref 0.0–7.0)
Eosinophils Absolute: 0.3 10*3/uL (ref 0.0–0.5)
HCT: 37 % (ref 34.8–46.6)
HGB: 11.9 g/dL (ref 11.6–15.9)
LYMPH%: 19 % (ref 14.0–49.7)
MCH: 27.4 pg (ref 25.1–34.0)
MCHC: 32.2 g/dL (ref 31.5–36.0)
MCV: 85.3 fL (ref 79.5–101.0)
MONO#: 0.7 10*3/uL (ref 0.1–0.9)
MONO%: 15.3 % — ABNORMAL HIGH (ref 0.0–14.0)
NEUT#: 2.6 10*3/uL (ref 1.5–6.5)
NEUT%: 57.8 % (ref 38.4–76.8)
Platelets: 159 10*3/uL (ref 145–400)
RBC: 4.34 10*6/uL (ref 3.70–5.45)
RDW: 15 % — ABNORMAL HIGH (ref 11.2–14.5)
WBC: 4.4 10*3/uL (ref 3.9–10.3)
lymph#: 0.8 10*3/uL — ABNORMAL LOW (ref 0.9–3.3)

## 2014-12-31 NOTE — Telephone Encounter (Signed)
Gave and printed appt sched and avs for pt for March 2017 °

## 2014-12-31 NOTE — Progress Notes (Signed)
  [facility] OFFICE PROGRESS NOTE   Diagnosis: Non-Hodgkin's lymphoma  INTERVAL HISTORY:   Amanda Sellers returns as scheduled. She feels well. She reports improved dyspnea since undergoing a cardioversion procedure last month. Good appetite. No palpable lymph nodes.  Objective:  Vital signs in last 24 hours:  Blood pressure 138/46, pulse 53, temperature 97.9 F (36.6 C), temperature source Oral, resp. rate 18, height $RemoveBe'5\' 6"'[id number]$  (1.676 m), weight 170 lb (77.111 kg), SpO2 100 %.    HEENT: Neck without mass Lymphatics: No cervical, supra-clavicular, axillary, or inguinal nodes Resp: Lungs clear bilaterally Cardio: Regular rate and rhythm GI: No hepatosplenomegaly, no mass Vascular: No leg edema   Lab Results:  Lab Results  Component Value Date   WBC 4.4 12/31/2014   HGB 11.9 12/31/2014   HCT 37.0 12/31/2014   MCV 85.3 12/31/2014   PLT 159 12/31/2014   NEUTROABS 2.6 12/31/2014     Medications: I have reviewed the patient's current medications.  Assessment/Plan: 1. Non-Hodgkin's Lymphoma-bone marrow biopsy 07/24/2014 consistent with involvement by follicular B-cell lymphoma, CD20 positive   CTs of the chest 07/22/2014 and CT of the abdomen and pelvis on 07/23/2014-pulmonary nodules, hilar/mediastinal/supraclavicular/axillary adenopathy, splenomegaly, abdominal adenopathy, bilateral adrenal nodules   Cycle 1 bendamustine/rituximab 08/04/2014  Cycle 2 bendamustine/Rituxan 09/01/2014  Cycle 3 bendamustine/Rituxan 09/30/2014  Cycle 4 bendamustine/rituximab 10/27/2014  Restaging CT scans 11/17/2014 with significant improvement in diffuse lymphadenopathy, lung nodules, and renal lesions 2. Severe microcytic anemia, status post transfusion with packed red blood cells 07/23/2014   Normal ferritin, low transferrin saturation, "scant" bone marrow iron stores, review of peripheral blood smear consistent with iron deficiency anemia    colonoscopy and upper  endoscopy a 31 2016 3. Exertional dyspnea /orthopnea-most likely secondary to congestive heart failure  4. History of a B-12 deficiency  5. Rash over the trunk and extremities 08/18/2014. Appears consistent with a drug rash. Resolved 08/28/2014.  Rash 11/18/2014, likely secondary to a contrast allergy , diagnostic contrast will be listed as an allergy 6. Colonoscopy 06/03/2008. Medium sized internal hemorrhoids.  7. Admission with atrial flutter with a rapid ventricular response 10/15/2014-status post cardioversion 10/16/2014, maintained on  apixaban  Repeat cardioversion 11/30/2014    Disposition:  Amanda Sellers is in clinical remission from non-Hodgkin's lymphoma. She will return for an office visit in 3 months.  [name], MD  12/31/2014  11:47 AM

## 2015-01-05 ENCOUNTER — Encounter (HOSPITAL_COMMUNITY): Payer: Self-pay | Admitting: Nurse Practitioner

## 2015-01-05 ENCOUNTER — Ambulatory Visit (HOSPITAL_COMMUNITY)
Admission: RE | Admit: 2015-01-05 | Discharge: 2015-01-05 | Disposition: A | Discharge status: Home / Self Care | Payer: Medicare HMO | Source: Ambulatory Visit | Attending: Nurse Practitioner | Admitting: Nurse Practitioner

## 2015-01-05 VITALS — BP 136/68 | HR 51 | Ht 65.0 in | Wt 171.6 lb

## 2015-01-05 DIAGNOSIS — Z7902 Long term (current) use of antithrombotics/antiplatelets: Secondary | ICD-10-CM | POA: Diagnosis not present

## 2015-01-05 DIAGNOSIS — Z79899 Other long term (current) drug therapy: Secondary | ICD-10-CM | POA: Diagnosis not present

## 2015-01-05 DIAGNOSIS — E559 Vitamin D deficiency, unspecified: Secondary | ICD-10-CM | POA: Insufficient documentation

## 2015-01-05 DIAGNOSIS — Z9221 Personal history of antineoplastic chemotherapy: Secondary | ICD-10-CM | POA: Diagnosis not present

## 2015-01-05 DIAGNOSIS — I4891 Unspecified atrial fibrillation: Secondary | ICD-10-CM

## 2015-01-05 DIAGNOSIS — F419 Anxiety disorder, unspecified: Secondary | ICD-10-CM | POA: Diagnosis not present

## 2015-01-05 DIAGNOSIS — E039 Hypothyroidism, unspecified: Secondary | ICD-10-CM | POA: Diagnosis not present

## 2015-01-05 DIAGNOSIS — D509 Iron deficiency anemia, unspecified: Secondary | ICD-10-CM | POA: Diagnosis not present

## 2015-01-05 DIAGNOSIS — I481 Persistent atrial fibrillation: Secondary | ICD-10-CM | POA: Diagnosis present

## 2015-01-05 DIAGNOSIS — I110 Hypertensive heart disease with heart failure: Secondary | ICD-10-CM | POA: Diagnosis not present

## 2015-01-05 DIAGNOSIS — C8510 Unspecified B-cell lymphoma, unspecified site: Secondary | ICD-10-CM | POA: Insufficient documentation

## 2015-01-05 DIAGNOSIS — I5030 Unspecified diastolic (congestive) heart failure: Secondary | ICD-10-CM | POA: Diagnosis not present

## 2015-01-05 DIAGNOSIS — E785 Hyperlipidemia, unspecified: Secondary | ICD-10-CM | POA: Diagnosis not present

## 2015-01-05 DIAGNOSIS — Z833 Family history of diabetes mellitus: Secondary | ICD-10-CM | POA: Diagnosis not present

## 2015-01-05 DIAGNOSIS — Z91041 Radiographic dye allergy status: Secondary | ICD-10-CM | POA: Diagnosis not present

## 2015-01-05 MED ORDER — METOPROLOL SUCCINATE ER 50 MG PO TB24: ORAL_TABLET | ORAL | Status: DC

## 2015-01-05 NOTE — Patient Instructions (Signed)
Your physician has recommended you make the following change in your medication:  1)Decrease metoprolol to 1/2 tablet (25mg ) twice daily  Follow up per Dr. Ellyn Hack

## 2015-01-05 NOTE — Progress Notes (Signed)
Patient ID: Amanda Sellers, female   DOB: Aug 20, 1932, 81 y.o.   MRN: [id number]           Primary Care Physician: Tammi Sou, MD Referring Physician: Dr. Ronni Rumble Amanda Sellers is a 81 y.o. female with a h/o  diastolic heart failure, afib, newly diagnosed with recent dx of lymphoma. She was admitted to [facility] 9/22 on IV dilt and given diuretics for acute on chronic systolic HF. It was arranged for to undergo TEE DCCV.  She had exertional dyspnea with PND & orthopnea.She felt very tired & had little energy. She  noted a few palpitations, but does not recall rapid irregular heart rates/rhythms.   She had TEE without thrombus and underwent DCCV that was successful with one shock of 120J. She did well post procedure and was seen by Dr. Jerilynn Mages. Croitoru and found stable for discharge. She was negative 2700cc since admit and weight was down 4 lbs with IV diuretics. D/C wt is 168.  Plan on d/c was for  uninterrupted anticoagulation for next 30 days, preferably lifelong barring bleeding complications. AFib clinic follow up in 1-2 weeks. Outpatient [facility], non-urgent. .ASA stopped.   She was seen in the afib clinic, 10/10 and found to be in afib with rvr. She went into afib short time after cardioversion. EKG showed afib at 130-140 bpm. She was  not on any drugs for rate control.  Is taking DOAC without fail. Continues with chemotherapy for lymphoma.   She was started on BB and  returned to clinic 10/12 with better v rates. 80's with sitting and 90-100's with activity. She does not have any PND/orthopnea although she has slept better in her recliner for months now.No lower extremity edema. Her weight is stable at home.  She has  finished chemotherapy for non- hodgkin's lymphoma and had f/u Pet scan 10/26,  and CA appears to be in remission, so per pt, no more chemo for right now. She has been on DOAC now x one month so will start attempts to restore SR. Discussed options  amiodarone or tikosyn and she would prefer amiodarone as she has two neighbors on amiodarone and they are both in rhythm and tolerating drug well. She is not interested in a hospital stay for drug loading. She reports rash with recent contrast dye. Amiodarone was started 200 mg bid, to then go down to 200 mg a day and then would discuss cardioversion.  She asked to be seen 11/2,  in the afib clinic for increased shortness of breath yesterday and last night. She has slept in the recliner for months but did not feel as well last night and slept poorly, feeling more short of breath and fullness in her chest.She has noticed increased pedal edema. She did not take her amiodarone last night or this am. Ekg shows rate controlled afib at 82 bpm, qtc at 464 ms.PO on RA is 98%. Weight is up 2 lbs from last visit, 6 lbs since 10/17. Lasix dose was decreased to 40 mg daily from 40 mg am and 20 mg pm when seen by Dr. Ellyn Hack 10/22.. Pt has noticed weight climbing for several days. She is fairly comfortable when quiet and sitting.   She was asked to double lasix to 80 mg  x 3 days, and to reduce amiodarone 200 mg a day. She had lost 3 pounds on return to clinic several days later. Clinically, she is much improved with more energy and less shortness of breath.  Able to sleep well. Had enough energy to walk into clinic today without wheelchair and felt well enough to clean her house yesterday. Ideally,I would like to load amiodarone a while longer but will go ahead and  pursue DCCV sooner than later, as it is contributing to her heart failure. Compliant with blood thinner without missed doses On recent labs, TSH was elevated but daughter explained that she just started taking replacement correctly on an empty stomach, and sees PCP 11/16 and she expects him to adjust the dose.  11/14-  Returns to afib clinic with a smile on her face and reporting all the activities she has been doing over the last week, having returned to  sinus rhythm after cardioversion. Much more energy and breathing is much improved.  Fluid status is stable.  12/13- Returns to afib clinic for f/u and reports doing well No afib, no further shortness of breath or edema. She is back to her usual activities. Got a good report form the Ca unit recently. Tolerating amiodarone 200 mg a day.  Today, she denies symptoms of palpitations, chest pain,  orthopnea, PND, mild lower extremity edema, no dizziness, presyncope, syncope, or neurologic sequela. Fatigue and mild dyspnea. The patient is tolerating medications without difficulties and is otherwise without complaint today.   Past Medical History  Diagnosis Date  . Vitamin B 12 deficiency   . HTN (hypertension)   . Vitamin D deficiency   . HLD (hyperlipidemia)      06/2014  . Chronic diastolic congestive heart failure ([place])     Echo 07/23/14: mild LVH, EF 50-55%, grade 2 diast dysfxn  . Microcytic anemia     transfused 3 U total in hosp 07/2014  . Positive occult stool blood test 08/08/14    Endoscopies ok 08/2014  . Hypothyroidism   . Pancytopenia due to antineoplastic chemotherapy ([place])   . Atrial fibrillation ([place])     With RVR; elec cardioversion 11/30/14  . Anticoagulation adequate, Eliquis 10/16/2014  . Chronic diastolic congestive heart failure ([place]) 2/62/0355    Complicated by atrial fibrillation  . Anxiety   . Pulmonary metastases ([place]) 07/24/2014  . Follicular lymphoma ([place])     Non Hodgkins B cell lymphoma; s/p chemo summer 2016--in remission as of 11/2014.   Past Surgical History  Procedure Laterality Date  . Breast biopsy    . Bone marrow biopsy  07/24/14  . Abdominal hysterectomy  1978  . Transthoracic echocardiogram  07/23/14    mild LVH, EF 50-55%, grade 2 diast dysfxn  . Upper gi endoscopy  09/23/2014    small hiatus hernia, nodules in stomach biopsied (chronic active erosive atrophic gastritis with intestinal metaplasia--no dysplasia or malignancy) otherwise normal  .  Colonoscopy  09/23/14    small hemorrhoids, otherwise normal (performed for IDA and heme+ stool)  . Tee without cardioversion N/A 10/16/2014    Procedure: TRANSESOPHAGEAL ECHOCARDIOGRAM (TEE);  Surgeon: Lelon Perla, MD;  Location: [place];  Service: Cardiovascular;  Laterality: N/A;  . Cardioversion N/A 10/16/2014    Procedure: CARDIOVERSION;  Surgeon: Lelon Perla, MD;  Location: [facility] ENDOSCOPY;  Service: Cardiovascular;  Laterality: N/A;  . Nm myoview ltd  10/29/2014    medium size mild surgery defect in the mid anterior and apical anterior location suggestive of breast attenuation. No reversibility. LOW RISK  . Cardioversion N/A 11/30/2014    Procedure: CARDIOVERSION;  Surgeon: Larey Dresser, MD;  Location: [place];  Service: Cardiovascular;  Laterality: N/A;    Current Outpatient  Prescriptions  Medication Sig Dispense Refill  . ALPRAZolam (XANAX) 0.5 MG tablet Take 1 tablet by mouth every 6 (six) hours as needed. anxiety    . amiodarone (PACERONE) 200 MG tablet Take 1 tablet (200 mg total) by mouth daily. 60 tablet 3  . apixaban (ELIQUIS) 5 MG TABS tablet Take 1 tablet (5 mg total) by mouth 2 (two) times daily. 56 tablet 0  . ergocalciferol (VITAMIN D2) 50000 UNITS capsule Take 50,000 Units by mouth once a week. On Sundays    . ferrous sulfate 325 (65 FE) MG tablet Take 1 tablet (325 mg total) by mouth 2 (two) times daily with a meal. 30 tablet 1  . fish oil-omega-3 fatty acids 1000 MG capsule Take 2 g by mouth daily.    . furosemide (LASIX) 40 MG tablet Take 61m by mouth in AM and take 213mby mouth in PM 90 tablet 3  . levothyroxine (SYNTHROID, LEVOTHROID) 100 MCG tablet Take 1 tablet (100 mcg total) by mouth daily before breakfast. 30 tablet 6  . metoprolol succinate (TOPROL-XL) 50 MG 24 hr tablet Take 1/2 tablet by mouth twice daily 180 tablet 3  . OXYGEN Inhale into the lungs. 1Lt at bedtime    . potassium chloride 20 MEQ TBCR Take 20 mEq by mouth daily. 30 tablet 3    . pravastatin (PRAVACHOL) 80 MG tablet Take 80 mg by mouth daily.    . Marland KitchenRESCRIPTION MEDICATION Chemo. [facility]   . Marland Kitchenrochlorperazine (COMPAZINE) 5 MG tablet Take 1 tablet (5 mg total) by mouth every 6 (six) hours as needed for nausea or vomiting. 30 tablet 1   No current facility-administered medications for this encounter.    Allergies  Allergen Reactions  . Iodinated Diagnostic Agents Rash    CT Contrast    Social History   Social History  . Marital Status: Married    Spouse Name: N/A  . Number of Children: 8  . Years of Education: N/A   Occupational History  . retired    Social History Main Topics  . Smoking status: Never Smoker   . Smokeless tobacco: Never Used  . Alcohol Use: No  . Drug Use: No  . Sexual Activity: Not on file   Other Topics Concern  . Not on file   Social History Narrative   Widowed, has 8 children.   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   No tob/alc/drugs.    Family History  Problem Relation Age of Onset  . Melanoma Mother   . Stomach cancer Father   . Lymphoma Sister   . Diabetes Daughter     ROS- All systems are reviewed and negative except as per the HPI above  Physical Exam: Filed Vitals:   01/05/15 1130  BP: 136/68  Pulse: 51  Height: '5\' 5"'  (1.651 m)  Weight: 171 lb 9.6 oz (77.837 kg)    GEN- The patient is well appearing, alert and oriented x 3 today.   Head- normocephalic, atraumatic Eyes-  Sclera clear, conjunctiva pink Ears- hearing intact Oropharynx- clear Neck- supple, no JVP Lymph- no cervical lymphadenopathy Lungs- Clear to ausculation bilaterally, normal work of breathing Heart-Regular rate and rhythm, no murmurs, rubs or gallops, PMI not laterally displaced GI- soft, NT, ND, + BS Extremities- no clubbing, cyanosis,  1+ edema, improved from recent 2+ edema MS- no significant deformity or  atrophy Skin- no rash or lesion Psych- euthymic mood, full affect Neuro- strength and sensation are intact  EKG- Sinus brady with v rate 51 bpm, pr int 206 ms, qrs int 92 ms, qtc 499 ms Epic records reviewed. Labs reviewed  Assessment and Plan: 1. Persistent symptomatic afib  Successful cardioversion 11/17, maintaining  SR Continue amiodarone 200 mg qd Decrease metprolol to 25 mg bid for bradycardia Continue apixaban for chadsvasc score of at least 4, has not missed any doses  2. Non Hodgkns B cell lymphoma S/p chemotherapy Stable  3. Diastolic heart failure Continue Lasix 40 am and 20 mg pm Fluid status stable Symptoms improved with return to SR  4. Elevated TSH Per pcp, will recheck in March 2017  F/u with Dr. Ellyn Hack 1/17 Consideration for PFT's to be done afib clinic as needed  Butch Penny C. Vonne Mcdanel, [facility] [address] [place], [place] [address] [phone]

## 2015-01-20 ENCOUNTER — Telehealth: Payer: Self-pay | Admitting: Cardiology

## 2015-01-20 NOTE — Telephone Encounter (Signed)
Patient calling the office for samples of medication: ° ° °1.  What medication and dosage are you requesting samples for? °Eliquis 5 mg  °2.  Are you currently out of this medication?  °Yes ° ° ° °

## 2015-01-21 NOTE — Telephone Encounter (Signed)
Calling again to see if you had any samples.

## 2015-01-21 NOTE — Telephone Encounter (Signed)
Medication Samples have been provided to the patient.  Drug name:   Qty: #56  LOTTK:[id number]  Exp.Date: jan 2019  The patient has been instructed regarding the correct time, dose, and frequency of taking this medication, including desired effects and most common side effects.  Left at front desk in bag with patients name and DOB Told patient they were ready  Kathy Breach 2:26 PM 01/21/2015

## 2015-01-22 ENCOUNTER — Other Ambulatory Visit (HOSPITAL_COMMUNITY): Payer: Self-pay

## 2015-01-22 MED ORDER — METOPROLOL SUCCINATE ER 50 MG PO TB24: ORAL_TABLET | ORAL | Status: DC

## 2015-01-26 ENCOUNTER — Other Ambulatory Visit (HOSPITAL_COMMUNITY): Payer: Self-pay

## 2015-01-26 MED ORDER — METOPROLOL SUCCINATE ER 50 MG PO TB24: ORAL_TABLET | ORAL | Status: DC

## 2015-02-09 ENCOUNTER — Encounter: Payer: Self-pay | Admitting: Cardiology

## 2015-02-09 ENCOUNTER — Ambulatory Visit (INDEPENDENT_AMBULATORY_CARE_PROVIDER_SITE_OTHER): Payer: PPO | Admitting: Cardiology

## 2015-02-09 VITALS — BP 153/64 | HR 68 | Ht 66.0 in | Wt 174.6 lb

## 2015-02-09 DIAGNOSIS — I5032 Chronic diastolic (congestive) heart failure: Secondary | ICD-10-CM | POA: Diagnosis not present

## 2015-02-09 DIAGNOSIS — I10 Essential (primary) hypertension: Secondary | ICD-10-CM | POA: Diagnosis not present

## 2015-02-09 DIAGNOSIS — Z7901 Long term (current) use of anticoagulants: Secondary | ICD-10-CM

## 2015-02-09 DIAGNOSIS — E785 Hyperlipidemia, unspecified: Secondary | ICD-10-CM | POA: Diagnosis not present

## 2015-02-09 DIAGNOSIS — I480 Paroxysmal atrial fibrillation: Secondary | ICD-10-CM

## 2015-02-09 DIAGNOSIS — Z79899 Other long term (current) drug therapy: Secondary | ICD-10-CM

## 2015-02-09 NOTE — Progress Notes (Signed)
PCP: Tammi Sou, MD  Clinic Note: Chief Complaint  Patient presents with  . Follow-up    3 month rov  . Atrial Fibrillation  . Congestive Heart Failure    HPI: Amanda Sellers is a 82 y.o. female with a PMH below who presents today for 39monthf/u. PAF (Afib RVR) with HFPF. Her A. Fib as in initially diagnosed in the setting of undergoing treatment for lymphoma -- this was initially in September. She was treated with IV diltiazem and diuresis for acute on chronic combined heart failure. She had TEE cardioversion. She was discharged on full anticoagulation.  After showing recurrence of A. fib, she was started on amiodarone -- She has finished chemotherapy for non-Hodgkin's lymphoma with follow-up PET scan in October. It would appear that her cancer is in remission. -- Her final Lasix dosing was 40 mg in the Amandam. and 20 in the p.m.  Studies Reviewed:   Myoview 10/29/2014:  Defect 1: There is a medium defect of mild severity present in the mid anterior and apical anterior location.  This is a low risk study.  1. Low risk MV 2. Breast attenuation artifact 3. Study would not gate secondary to afib and PVCs   TEE 10/16/2014: Normal LV function (EF 55-60%); biatrial enlargment; spontaneous contrast in LAA but no thrombus; mild AI; moderate MR (2+); mild TR.   Amanda Sellers was last seen in Oct 2016 by me, but has been followed by the Afib clinic. - last seen on Dec 13 after DCCV in November.  -- She was noted to be in persistent A. Fib --> now that she was loaded on amiodarone, and on standing dose of DOAC (apixaban).  Because of increased dyspnea, she was placed on increased dose of Lasix to 80 mg in the mornings for couple days. She diuresed well with the higher dose of Lasix.  She was then referred back for cardioversion while on amiodarone.  Recent Hospitalizations: Outpatient cardioversion on November 7 He then [facility] following cardio[facility] and  felt much better. More energy and breathing improved. On December 13, she continued to do well. Back to routine activities.   Interval History: SRaeliereturns today for follow-up with me again doing fairly well. He is not noted any more of the significant dyspnea or edema. She is under stable dose of Lasix at 40 mg the morning and 20 in the evening. She is not noted any rapid or irregular heartbeats. She indeed is eating better and actually putting on some weight after having lost weight from her cancer. 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 Her energy is deathly improved and she is smiling.   No chest pain or shortness of breath with rest or exertion. No PND, orthopnea or edema. No palpitations, lightheadedness, dizziness, weakness or syncope/near syncope. No TIA/amaurosis fugax symptoms. No melena, hematochezia, hematuria, or epstaxis - while on DOAC No claudication.  ROS: A comprehensive was performed. Review of Systems  Constitutional: Negative for malaise/fatigue (notably improved energy).  Eyes: Negative for blurred vision.  Respiratory: Negative for hemoptysis and shortness of breath.   Gastrointestinal: Negative for heartburn.  Musculoskeletal: Negative for myalgias, joint pain and falls.  Neurological: Negative for loss of consciousness and headaches.  Endo/Heme/Allergies: Bruises/bleeds easily.  Psychiatric/Behavioral: Negative for depression.  All other systems reviewed and are negative.   Past Medical History  Diagnosis Date  . Vitamin B 12 deficiency   .  HTN (hypertension)   . Vitamin D deficiency   . HLD (hyperlipidemia)      06/2014  . Chronic diastolic congestive heart failure ([place])     Echo 07/23/14: mild LVH, EF 50-55%, grade 2 diast dysfxn  . Microcytic anemia     transfused 3 U total in hosp 07/2014  . Positive occult stool blood test 08/08/14    Endoscopies ok 08/2014  . Hypothyroidism   . Pancytopenia due  to antineoplastic chemotherapy ([place])   . Atrial fibrillation ([place])     With RVR; elec cardioversion 11/30/14  . Anticoagulation adequate, Eliquis 10/16/2014  . Chronic diastolic congestive heart failure ([place]) 03/05/1550    Complicated by atrial fibrillation  . Anxiety   . Pulmonary metastases ([place]) 07/24/2014  . Follicular lymphoma ([place])     Non Hodgkins B cell lymphoma; s/p chemo summer 2016--in remission as of 11/2014.    Past Surgical History  Procedure Laterality Date  . Breast biopsy    . Bone marrow biopsy  07/24/14  . Abdominal hysterectomy  1978  . Transthoracic echocardiogram  07/23/14    mild LVH, EF 50-55%, grade 2 diast dysfxn  . Upper gi endoscopy  09/23/2014    small hiatus hernia, nodules in stomach biopsied (chronic active erosive atrophic gastritis with intestinal metaplasia--no dysplasia or malignancy) otherwise normal  . Colonoscopy  09/23/14    small hemorrhoids, otherwise normal (performed for IDA and heme+ stool)  . Tee without cardioversion N/A 10/16/2014    Procedure: TRANSESOPHAGEAL ECHOCARDIOGRAM (TEE);  Surgeon: Lelon Perla, MD;  Location: [place];  Service: Cardiovascular;  Laterality: N/A;  . Cardioversion N/A 10/16/2014    Procedure: CARDIOVERSION;  Surgeon: Lelon Perla, MD;  Location[facility] ENDOSCOPY;  Service: Cardiovascular;  Laterality: N/A;  . Nm myoview ltd  10/29/2014    medium size mild surgery defect in the mid anterior and apical anterior location suggestive of breast attenuation. No reversibility. LOW RISK  . Cardioversion N/A 11/30/2014    Procedure: CARDIOVERSION;  Surgeon: Larey Dresser, MD;  Location: [place];  Service: Cardiovascular;  Laterality: N/A;    Prior to Admission medications   Medication Sig Start Date End Date Taking? Authorizing Provider  ALPRAZolam Duanne Moron) 0.5 MG tablet Take 1 tablet by mouth every 6 (six) hours as needed. anxiety 08/04/14  Yes Historical Provider, MD  amiodarone (PACERONE) 200 MG tablet Take 1  tablet (200 mg total) by mouth daily. 11/25/14  Yes Sherran Needs, NP  apixaban (ELIQUIS) 5 MG TABS tablet Take 1 tablet (5 mg total) by mouth 2 (two) times daily. 11/09/14  Yes Leonie Man, MD  ergocalciferol (VITAMIN D2) 50000 UNITS capsule Take 50,000 Units by mouth once a week. On Sundays   Yes Historical Provider, MD  ferrous sulfate 325 (65 FE) MG tablet Take 1 tablet (325 mg total) by mouth 2 (two) times daily with a meal. 07/29/14  Yes Ladell Pier, MD  fish oil-omega-3 fatty acids 1000 MG capsule Take 2 g by mouth daily.   Yes Historical Provider, MD  furosemide (LASIX) 40 MG tablet Take 3m by mouth in AM and take 24mby mouth in PM 11/27/14  Yes DoSherran NeedsNP  levothyroxine (SYNTHROID, LEVOTHROID) 100 MCG tablet Take 1 tablet (100 mcg total) by mouth daily before breakfast. 12/24/14  Yes PhTammi SouMD  metoprolol succinate (TOPROL-XL) 50 MG 24 hr tablet Take 1/2 tablet by mouth twice daily 01/26/15  Yes DoSherran NeedsNP  OXYGEN Inhale  into the lungs. 1Lt at bedtime   Yes Historical Provider, MD  potassium chloride 20 MEQ TBCR Take 20 mEq by mouth daily. 11/25/14  Yes Sherran Needs, NP  pravastatin (PRAVACHOL) 80 MG tablet Take 80 mg by mouth daily.   Yes Historical Provider, MD  PRESCRIPTION MEDICATION Chemo. Last dose was 10-02-14 at [facility].   Yes Historical Provider, MD  prochlorperazine (COMPAZINE) 5 MG tablet Take 1 tablet (5 mg total) by mouth every 6 (six) hours as needed for nausea or vomiting. 08/04/14  Yes Ladell Pier, MD   Allergies  Allergen Reactions  . Iodinated Diagnostic Agents Rash    CT Contrast    Social History   Social History  . Marital Status: Married    Spouse Name: N/A  . Number of Children: 8  . Years of Education: N/A   Occupational History  . retired    Social History Main Topics  . Smoking status: Never Smoker   . Smokeless tobacco: Never Used  . Alcohol Use: No  . Drug Use: No  . Sexual Activity: Not Asked   Other Topics  Concern  . None   Social History Narrative   Widowed, has 8 children.   Orig from [place], now lives in [place].   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   No tob/alc/drugs.   Family History  Problem Relation Age of Onset  . Melanoma Mother   . Stomach cancer Father   . Lymphoma Sister   . Diabetes Daughter     Wt Readings from Last 3 Encounters:  02/09/15 174 lb 9.6 oz (79.198 kg)  01/05/15 171 lb 9.6 oz (77.837 kg)  12/31/14 170 lb (77.111 kg)    PHYSICAL EXAM BP 153/64 mmHg  Pulse 68  Ht '5\' 6"'  (1.676 m)  Wt 174 lb 9.6 oz (79.198 kg)  BMI 28.19 kg/m2 General appearance: alert, cooperative, appears stated age, no acute distress. Healthy-appearing Neck: no adenopathy, no carotid bruit and mildly elevated JVD we cannon A waves Lungs: normal percussion bilaterally and Mild, diffuse interstitial sounds bilaterally. Nonlabored. Good air movement. Heart: RRR. Normal S1 & S2. Nondisplaced PMI. Soft 1-2/6 SEM. No R./G. Abdomen: soft, non-tender; bowel sounds normal; no masses, no organomegaly Extremities: 2-+ edema bilaterally distal leg to ankles Pulses: 2+ and symmetric Skin: Skin color, texture, turgor normal. No lesions.  Neurologic/Psych : Alert and oriented X 3, normal strength and tone. Normal symmetric reflexes. Normal coordination and gait. CN II-XII grossly intact. Pleasant mood and affect..    Adult ECG Report - Not checked   Other studies Reviewed: Additional studies/ records that were reviewed today include:  Recent Labs:   Lab Results  Component Value Date   TSH 5.593* 11/25/2014  -- Recently increased Synthroid dosing.  Lab Results  Component Value Date   CREATININE 0.86 12/09/2014   Lab Results  Component Value Date   K 4.2 12/09/2014   Lab Results  Component Value Date   ALT 17 12/09/2014   AST 20 12/09/2014   ALKPHOS 68 12/09/2014   BILITOT 0.5 12/09/2014    ASSESSMENT / PLAN: Problem List  Items Addressed This Visit    Paroxysmal atrial fibrillation with RVR (HCC) (Chronic)    Notably improved and less symptomatic now that she has been cardioverted. It would appear based on her recurrence that she needs rhythm control in addition to rate control. She seems to be relatively well-controlled now on amiodarone. She is  also on low-dose Toprol which she is taking twice a day to avoid hypotension.  This patients CHA2DS2-VASc Score and unadjusted Ischemic Stroke Rate (% per year) is equal to 4.8 % stroke rate/year from a score of 4 --> anticoagulated with ELIQUIS/apixaban.  No bleeding complications  She will alternate following up between me and A. fib clinic. For now we will alternate every 3 months, but once she is more stable, we can increase it to every 6 months.       On amiodarone therapy (Chronic)    With a new start amiodarone, she will need to have annual PFTs, LFTs and I exam. She is already on Synthroid making thyroid perturbation less of an issue.  We will contact her prior to her follow-up in A. fib clinic in order to get these studies arranged. She has already had LFTs      Hyperlipidemia   Relevant Orders   Ambulatory referral to Cardiology   Essential hypertension (Chronic)    Now that she is in sinus rhythm, she is actually borderline hypertensive.  She is on stable dose of beta blocker which I would not want to increase. She tells me at home her blood pressures are usually in the 120s over 60s. If there is a sign of there are increasing, we may need to consider adding ACE inhibitor or ARB for afterload reduction.      Relevant Orders   Ambulatory referral to Cardiology   Chronic diastolic CHF (congestive heart failure), NYHA class 2 ([place]) - Primary (Chronic)    Relatively stable while in sinus rhythm and rate controlled. Edema is well-controlled with current dose of Lasix. We talked about daily weights with adjusting Lasix based on any weight increase on a  sliding-scale model.  She is on supplement potassium (if she takes extra Lasix, she should take extra potassium) Currently not on afterload reduction, we'll need to monitor her blood pressure -- if able, would like to add ARB or ACE inhibitor.      Anticoagulation adequate, Eliquis    In the past it appeared that Xarelto was preferred on her insurance, now she is on ELIQUIS. As long as there are no insurance issues, and probably happier with her being on ELIQUIS. Just need to monitor renal function which is currently stable. No bleeding issues.         Current medicines are reviewed at length with the patient today. (+/- concerns) n/a The following changes have been made: n/a  CONTINUE [place].  Dose Lasix accordingly    Your physician recommends that you schedule a follow-up appointment in  [place] -- Will need baseline PFTs, LFTs -- Will need annual Eye Exam.  Your physician wants you to follow-up in [place].  Studies Ordered:   Orders Placed This Encounter  Procedures  . Ambulatory referral to Cardiology       Leonie Man, M.D., M.S. Interventional Cardiologist   Pager # [phone]

## 2015-02-09 NOTE — Patient Instructions (Signed)
CONTINUE WEIGHING YOURSELF DAILY.   Your physician recommends that you schedule a follow-up appointment in  [place] wants you to follow-up in [place].  You will receive a reminder letter in the mail two months in advance. If you don't receive a letter, please call our office to schedule the follow-up appointment.

## 2015-02-10 ENCOUNTER — Telehealth: Payer: Self-pay | Admitting: *Deleted

## 2015-02-10 MED ORDER — APIXABAN 5 MG PO TABS: 5.0000 mg | ORAL_TABLET | Freq: Two times a day (BID) | ORAL | Status: DC

## 2015-02-10 NOTE — Telephone Encounter (Signed)
INFORMED DAUGHTER   SAMPLES OF ELIQUIS  X 3 FOR PICK UP

## 2015-02-11 ENCOUNTER — Encounter: Payer: Self-pay | Admitting: Cardiology

## 2015-02-11 DIAGNOSIS — Z79899 Other long term (current) drug therapy: Secondary | ICD-10-CM | POA: Insufficient documentation

## 2015-02-11 NOTE — Assessment & Plan Note (Signed)
With a new start amiodarone, she will need to have annual PFTs, LFTs and I exam. She is already on Synthroid making thyroid perturbation less of an issue.  We will contact her prior to her follow-up in A. fib clinic in order to get these studies arranged. She has already had LFTs

## 2015-02-11 NOTE — Assessment & Plan Note (Signed)
Relatively stable while in sinus rhythm and rate controlled. Edema is well-controlled with current dose of Lasix. We talked about daily weights with adjusting Lasix based on any weight increase on a sliding-scale model.  She is on supplement potassium (if she takes extra Lasix, she should take extra potassium) Currently not on afterload reduction, we'll need to monitor her blood pressure -- if able, would like to add ARB or ACE inhibitor.

## 2015-02-11 NOTE — Assessment & Plan Note (Signed)
Now that she is in sinus rhythm, she is actually borderline hypertensive.  She is on stable dose of beta blocker which I would not want to increase. She tells me at home her blood pressures are usually in the 120s over 60s. If there is a sign of there are increasing, we may need to consider adding ACE inhibitor or ARB for afterload reduction.

## 2015-02-11 NOTE — Assessment & Plan Note (Signed)
In the past it appeared that Xarelto was preferred on her insurance, now she is on ELIQUIS. As long as there are no insurance issues, and probably happier with her being on ELIQUIS. Just need to monitor renal function which is currently stable. No bleeding issues.

## 2015-02-11 NOTE — Assessment & Plan Note (Addendum)
Notably improved and less symptomatic now that she has been cardioverted. It would appear based on her recurrence that she needs rhythm control in addition to rate control. She seems to be relatively well-controlled now on amiodarone. She is also on low-dose Toprol which she is taking twice a day to avoid hypotension.  This patients CHA2DS2-VASc Score and unadjusted Ischemic Stroke Rate (% per year) is equal to 4.8 % stroke rate/year from a score of 4 --> anticoagulated with ELIQUIS/apixaban.  No bleeding complications  She will alternate following up between me and A. fib clinic. For now we will alternate every 3 months, but once she is more stable, we can increase it to every 6 months.

## 2015-02-17 ENCOUNTER — Telehealth: Payer: Self-pay | Admitting: *Deleted

## 2015-02-17 DIAGNOSIS — I480 Paroxysmal atrial fibrillation: Secondary | ICD-10-CM

## 2015-02-17 DIAGNOSIS — Z79899 Other long term (current) drug therapy: Secondary | ICD-10-CM

## 2015-02-17 DIAGNOSIS — I5032 Chronic diastolic (congestive) heart failure: Secondary | ICD-10-CM

## 2015-02-17 NOTE — Telephone Encounter (Signed)
Per Dr Ellyn Hack, would like patient to have a PFT DUE TO USING AMIODARONE. TEST ORDERED . LEFT MESSAGE FOR DAUGHTER TO CALL BACK - TO INFORM. PATIENT IS HARD OF HEARING ON THE PHONE

## 2015-02-18 NOTE — Telephone Encounter (Signed)
Informed daughter - patient will be schedule PFT

## 2015-02-18 NOTE — Telephone Encounter (Signed)
Follow up  ° ° ° °Returning call back to nurse  °

## 2015-02-19 ENCOUNTER — Encounter: Payer: Self-pay | Admitting: Oncology

## 2015-02-19 NOTE — Progress Notes (Signed)
Per daughter Reuben Likes notes for mom 10/2014-present needs to be faxed for cancer policy [id number].

## 2015-02-24 DIAGNOSIS — M7021 Olecranon bursitis, right elbow: Secondary | ICD-10-CM

## 2015-02-24 HISTORY — PX: OTHER SURGICAL HISTORY: SHX169

## 2015-02-24 HISTORY — DX: Olecranon bursitis, right elbow: M70.21

## 2015-02-26 ENCOUNTER — Ambulatory Visit (HOSPITAL_COMMUNITY)
Admission: RE | Admit: 2015-02-26 | Discharge: 2015-02-26 | Disposition: A | Discharge status: Home / Self Care | Payer: PPO | Source: Ambulatory Visit | Attending: Cardiology | Admitting: Cardiology

## 2015-02-26 DIAGNOSIS — I5032 Chronic diastolic (congestive) heart failure: Secondary | ICD-10-CM | POA: Diagnosis not present

## 2015-02-26 DIAGNOSIS — I480 Paroxysmal atrial fibrillation: Secondary | ICD-10-CM

## 2015-02-26 DIAGNOSIS — Z79899 Other long term (current) drug therapy: Secondary | ICD-10-CM | POA: Diagnosis not present

## 2015-02-26 LAB — PULMONARY FUNCTION TEST
DL/VA % pred: 82 %
DL/VA: 4.16 ml/min/mmHg/L
DLCO unc % pred: 56 %
DLCO unc: 15.31 ml/min/mmHg
FEF 25-75 Post: 1.89 L/sec
FEF 25-75 Pre: 1.37 L/sec
FEF2575-%Change-Post: 37 %
FEF2575-%Pred-Post: 125 %
FEF2575-%Pred-Pre: 91 %
FEV1-%Change-Post: 3 %
FEV1-%Pred-Post: 83 %
FEV1-%Pred-Pre: 80 %
FEV1-Post: 1.76 L
FEV1-Pre: 1.7 L
FEV1FVC-%Change-Post: 0 %
FEV1FVC-%Pred-Pre: 108 %
FEV6-%Change-Post: 3 %
FEV6-%Pred-Post: 81 %
FEV6-%Pred-Pre: 78 %
FEV6-Post: 2.19 L
FEV6-Pre: 2.12 L
FEV6FVC-%Pred-Post: 105 %
FEV6FVC-%Pred-Pre: 105 %
FVC-%Change-Post: 3 %
FVC-%Pred-Post: 77 %
FVC-%Pred-Pre: 74 %
FVC-Post: 2.19 L
FVC-Pre: 2.12 L
Post FEV1/FVC ratio: 80 %
Post FEV6/FVC ratio: 100 %
Pre FEV1/FVC ratio: 80 %
Pre FEV6/FVC Ratio: 100 %
RV % pred: 136 %
RV: 3.42 L
TLC % pred: 101 %
TLC: 5.41 L

## 2015-02-26 MED ORDER — ALBUTEROL SULFATE (2.5 MG/3ML) 0.083% IN NEBU
2.5000 mg | INHALATION_SOLUTION | Freq: Once | RESPIRATORY_TRACT | Status: AC
  Administered 2015-02-26: 2.5 mg via RESPIRATORY_TRACT

## 2015-03-03 ENCOUNTER — Encounter: Payer: Self-pay | Admitting: Family Medicine

## 2015-03-11 ENCOUNTER — Encounter: Payer: Self-pay | Admitting: Family Medicine

## 2015-03-11 ENCOUNTER — Ambulatory Visit (INDEPENDENT_AMBULATORY_CARE_PROVIDER_SITE_OTHER): Payer: PPO | Admitting: Family Medicine

## 2015-03-11 VITALS — BP 125/62 | HR 55 | Temp 97.6°F | Resp 16 | Ht 66.0 in | Wt 176.5 lb

## 2015-03-11 DIAGNOSIS — F411 Generalized anxiety disorder: Secondary | ICD-10-CM

## 2015-03-11 DIAGNOSIS — E039 Hypothyroidism, unspecified: Secondary | ICD-10-CM | POA: Diagnosis not present

## 2015-03-11 DIAGNOSIS — I10 Essential (primary) hypertension: Secondary | ICD-10-CM

## 2015-03-11 DIAGNOSIS — I5032 Chronic diastolic (congestive) heart failure: Secondary | ICD-10-CM

## 2015-03-11 DIAGNOSIS — R2231 Localized swelling, mass and lump, right upper limb: Secondary | ICD-10-CM

## 2015-03-11 DIAGNOSIS — Z862 Personal history of diseases of the blood and blood-forming organs and certain disorders involving the immune mechanism: Secondary | ICD-10-CM

## 2015-03-11 DIAGNOSIS — I4891 Unspecified atrial fibrillation: Secondary | ICD-10-CM

## 2015-03-11 LAB — CBC WITH DIFFERENTIAL/PLATELET
HCT: 32 % — ABNORMAL LOW (ref 36.0–46.0)
Hemoglobin: 10.6 g/dL — ABNORMAL LOW (ref 12.0–15.0)
MCHC: 33.2 g/dL (ref 30.0–36.0)
MCV: 81.3 fl (ref 78.0–100.0)
Platelets: 249 10*3/uL (ref 150.0–400.0)
RBC: 3.94 Mil/uL (ref 3.87–5.11)
RDW: 19.2 % — ABNORMAL HIGH (ref 11.5–15.5)
WBC: 2.4 10*3/uL — ABNORMAL LOW (ref 4.0–10.5)

## 2015-03-11 LAB — TSH: TSH: 5.18 u[IU]/mL — ABNORMAL HIGH (ref 0.35–4.50)

## 2015-03-11 MED ORDER — PRAVASTATIN SODIUM 80 MG PO TABS: 80.0000 mg | ORAL_TABLET | Freq: Every day | ORAL | Status: DC

## 2015-03-11 MED ORDER — ALPRAZOLAM 0.5 MG PO TABS: ORAL_TABLET | ORAL | Status: DC

## 2015-03-11 NOTE — Progress Notes (Signed)
Pre visit review using our clinic review tool, if applicable. No additional management support is needed unless otherwise documented below in the visit note. 

## 2015-03-11 NOTE — Progress Notes (Signed)
OFFICE VISIT  03/13/2015   CC:  Chief Complaint  Patient presents with  . Follow-up    Pt is not fasting.    HPI:    Patient is a 82 y.o. Caucasian female who presents for 3 mo f/u HTN, anxiety d/o, hx of iron def anemia, hypothyroidism, chronic diast CHF, A-fib on amio and eliquis.  Also hx of Follicular B cell lymphoma in remission as of 12/2014 imaging and f/u with oncology.  A knot has come up over the last 10d on R forearm near elbow.  No pain.  No injury recalled.  She states she feels good, anxiety well controlled, seems to be in good spirits. Home bp monitoring normal. Taking thyroid med correctly every day.  TSH check 11/2014 was a touch elevated but she was feeling well and I did not want to increase her synthroid due to her dx of a-fib.   Denies any CP, SOB, palpiations, heart racing, orthopnea, PND, or significant increase in LE edema.  No nose bleeds, BRBPR, melena, or gross hematuria.  She requests RF of alpraz 0.5 mg to take qhs for insomnia due to anxiety.  She has not taken this since 08/2014.   Past Medical History  Diagnosis Date  . Vitamin B 12 deficiency   . HTN (hypertension)   . Vitamin D deficiency   . HLD (hyperlipidemia)      06/2014  . Chronic diastolic congestive heart failure ([place])     Echo 07/23/14: mild LVH, EF 50-55%, grade 2 diast dysfxn  . Microcytic anemia     transfused 3 U total in hosp 07/2014  . Positive occult stool blood test 08/08/14    Endoscopies ok 08/2014  . Hypothyroidism   . Pancytopenia due to antineoplastic chemotherapy ([place])   . Atrial fibrillation ([place])     With RVR; elec cardioversion 11/30/14  . Anticoagulation adequate, Eliquis 10/16/2014  . Chronic diastolic congestive heart failure ([place]) 09/02/313    Complicated by atrial fibrillation  . Anxiety   . Pulmonary metastases ([place]) 07/24/2014  . Follicular lymphoma ([place])     Non Hodgkins B cell lymphoma; s/p chemo summer 2016--in remission as of 11/2014.    Past Surgical  History  Procedure Laterality Date  . Breast biopsy    . Bone marrow biopsy  07/24/14  . Abdominal hysterectomy  1978  . Transthoracic echocardiogram  07/23/14    mild LVH, EF 50-55%, grade 2 diast dysfxn  . Upper gi endoscopy  09/23/2014    small hiatus hernia, nodules in stomach biopsied (chronic active erosive atrophic gastritis with intestinal metaplasia--no dysplasia or malignancy) otherwise normal  . Colonoscopy  09/23/14    small hemorrhoids, otherwise normal (performed for IDA and heme+ stool)  . Tee without cardioversion N/A 10/16/2014    Procedure: TRANSESOPHAGEAL ECHOCARDIOGRAM (TEE);  Surgeon: Lelon Perla, MD;  Location: [place];  Service: Cardiovascular;  Laterality: N/A;  . Cardioversion N/A 10/16/2014    Procedure: CARDIOVERSION;  Surgeon: Lelon Perla, MD;  Location: [facility] ENDOSCOPY;  Service: Cardiovascular;  Laterality: N/A;  . Nm myoview ltd  10/29/2014    medium size mild surgery defect in the mid anterior and apical anterior location suggestive of breast attenuation. No reversibility. LOW RISK  . Cardioversion N/A 11/30/2014    Procedure: CARDIOVERSION;  Surgeon: Larey Dresser, MD;  Location: [facility] ENDOSCOPY;  Service: Cardiovascular;  Laterality: N/A;  . Pfts  02/2015    Restriction with diffusion defect: cardiologist referred her to pulm to help interpret  PFTs and decide whether she has amiodarone toxicity    Outpatient Prescriptions Prior to Visit  Medication Sig Dispense Refill  . amiodarone (PACERONE) 200 MG tablet Take 1 tablet (200 mg total) by mouth daily. 60 tablet 3  . apixaban (ELIQUIS) 5 MG TABS tablet Take 1 tablet (5 mg total) by mouth 2 (two) times daily. 42 tablet 0  . ergocalciferol (VITAMIN D2) 50000 UNITS capsule Take 50,000 Units by mouth once a week. On Sundays    . ferrous sulfate 325 (65 FE) MG tablet Take 1 tablet (325 mg total) by mouth 2 (two) times daily with a meal. 30 tablet 1  . fish oil-omega-3 fatty acids 1000 MG capsule Take 2 g by  mouth daily.    . furosemide (LASIX) 40 MG tablet Take 71m by mouth in AM and take 214mby mouth in PM 90 tablet 3  . levothyroxine (SYNTHROID, LEVOTHROID) 100 MCG tablet Take 1 tablet (100 mcg total) by mouth daily before breakfast. 30 tablet 6  . metoprolol succinate (TOPROL-XL) 50 MG 24 hr tablet Take 1/2 tablet by mouth twice daily 180 tablet 3  . OXYGEN Inhale into the lungs. 1Lt at bedtime    . potassium chloride 20 MEQ TBCR Take 20 mEq by mouth daily. 30 tablet 3  . PRESCRIPTION MEDICATION Chemo. [facility]   . Marland KitchenLPRAZolam (XANAX) 0.5 MG tablet Take 1 tablet by mouth every 6 (six) hours as needed. anxiety    . pravastatin (PRAVACHOL) 80 MG tablet Take 80 mg by mouth daily.    . prochlorperazine (COMPAZINE) 5 MG tablet Take 1 tablet (5 mg total) by mouth every 6 (six) hours as needed for nausea or vomiting. (Patient not taking: Reported on 03/11/2015) 30 tablet 1   No facility-administered medications prior to visit.    Allergies  Allergen Reactions  . Iodinated Diagnostic Agents Rash    CT Contrast    ROS As per HPI  PE: Blood pressure 125/62, pulse 55, temperature 97.6 F (36.4 C), temperature source Oral, resp. rate 16, height _0  (1.676 m), weight 176 lb 8 oz (80.06 kg), SpO2 97 %. Gen: Alert, well appearing.  Patient is oriented to person, place, time, and situation. AFFECT: pleasant, lucid thought and speech. CV: irreg irreg rhythm, rate about 60s, no m/r LUNGS; CTA bilat, nonlabored. EXT 1+ bilat LL pitting edema R forearm on posterior aspect directly overlying the ulna near the level of the elbow she has a 1 and 1/2 inch diameter soft subQ nodule that is moveable and nontender and does not feel like it is attached to bone or surrounding muscle. No skin changes.    LABS:    Chemistry      Component Value Date/Time   NA 140 12/09/2014 0934   NA 143 11/17/2014 0801   K 4.2 12/09/2014 0934   K 3.6 11/17/2014 0801   CL 100 12/09/2014 0934   CO2  32* 12/09/2014 0934   CO2 30* 11/17/2014 0801   BUN 15 12/09/2014 0934   BUN 16.3 11/17/2014 0801   CREATININE 0.86 12/09/2014 0934   CREATININE 0.82 11/27/2014 1117   CREATININE 0.7 11/17/2014 0801      Component Value Date/Time   CALCIUM 8.8 12/09/2014 0934   CALCIUM 8.9 11/17/2014 0801   ALKPHOS 68 12/09/2014 0934   ALKPHOS 70 11/17/2014 0801   AST 20 12/09/2014 0934   AST 32 11/17/2014 0801   ALT 17 12/09/2014 0934   ALT 30 11/17/2014  0801   BILITOT 0.5 12/09/2014 0934   BILITOT 0.55 11/17/2014 0801     Lab Results  Component Value Date   CHOL 150 12/09/2014   HDL 49 12/09/2014   LDLCALC 74 12/09/2014   TRIG 135 12/09/2014   CHOLHDL 3.1 12/09/2014   Lab Results  Component Value Date   WBC 2.4 Repeated and verified X2.* 03/11/2015   HGB 10.6* 03/11/2015   HCT 32.0* 03/11/2015   MCV 81.3 03/11/2015   PLT 249.0 03/11/2015   Lab Results  Component Value Date   TSH 5.18* 03/11/2015   IMPRESSION AND PLAN:  1) R arm soft tissue mass, new: with her history of lymphoma I need to image this and rule out possible recurrence. Will start with ultrasound of the mass.   Recheck CBC w/diff today.  2) HTN; The current medical regimen is effective;  continue present plan and medications.  3) A-fib: continue amio and toprol + eliquis.  4) Hypothyroidism: last TSH slightly elevated but I chose to make no dose change. She is feeling well.  Recheck TSH today.  5) Chronic diastolic CHF: no sign of volume overload.  Continue current diuretic regimen.  Low Na diet.  6) Hx of iron def anemia: she continues iron therapy (she had pancytopenia due to antineoplastic therapy.  This was after getting transfused for IDA summer 2016).  Monitor CBC today.  An After Visit Summary was printed and given to the patient.  FOLLOW UP: Return in about 4 months (around 07/09/2015) for routine chronic illness f/u.

## 2015-03-15 ENCOUNTER — Ambulatory Visit
Admission: RE | Admit: 2015-03-15 | Discharge: 2015-03-15 | Disposition: A | Discharge status: Home / Self Care | Payer: PPO | Source: Ambulatory Visit | Attending: Family Medicine | Admitting: Family Medicine

## 2015-03-15 ENCOUNTER — Telehealth: Payer: Self-pay | Admitting: Cardiology

## 2015-03-15 DIAGNOSIS — R2231 Localized swelling, mass and lump, right upper limb: Secondary | ICD-10-CM

## 2015-03-15 NOTE — Telephone Encounter (Signed)
Pt contacted and told that Eliquis samples are left at the front desk for her. She stated that the medication cost for a one month supply is $200. She stated that she would like to stay on this as she is worried about interactions if she were to change to cheaper medications options.

## 2015-03-15 NOTE — Telephone Encounter (Signed)
Patient calling the office for samples of medication: ° ° °1.  What medication and dosage are you requesting samples for?Eliquis   ° °2.  Are you currently out of this medication? NO  ° ° °

## 2015-03-15 NOTE — Telephone Encounter (Signed)
Patient insurance prefer xarelto but patient prefer to stay with eliquis

## 2015-03-17 ENCOUNTER — Encounter: Payer: Self-pay | Admitting: Family Medicine

## 2015-03-18 ENCOUNTER — Telehealth: Payer: Self-pay | Admitting: Family Medicine

## 2015-03-18 NOTE — Telephone Encounter (Signed)
Pt would like to results from her Ultrasound that was done on Monday.

## 2015-03-18 NOTE — Telephone Encounter (Signed)
Left message with [place] results and instructions on patient voice mail per [facility]

## 2015-03-29 ENCOUNTER — Telehealth: Payer: Self-pay | Admitting: Oncology

## 2015-03-29 ENCOUNTER — Ambulatory Visit (HOSPITAL_BASED_OUTPATIENT_CLINIC_OR_DEPARTMENT_OTHER): Payer: PPO | Admitting: Oncology

## 2015-03-29 VITALS — BP 165/52 | HR 65 | Temp 97.6°F | Resp 17 | Ht 66.0 in | Wt 177.5 lb

## 2015-03-29 DIAGNOSIS — L988 Other specified disorders of the skin and subcutaneous tissue: Secondary | ICD-10-CM | POA: Diagnosis not present

## 2015-03-29 DIAGNOSIS — C8290 Follicular lymphoma, unspecified, unspecified site: Secondary | ICD-10-CM

## 2015-03-29 NOTE — Progress Notes (Signed)
  [facility] OFFICE PROGRESS NOTE   Diagnosis: Non-Hodgkin's lymphoma  INTERVAL HISTORY:   Ms. Duch returns as scheduled. She feels well. No fever, night sweats, or anorexia. No palpable lymph nodes. She has noted a fullness at the right olecranon area for the past month. An ultrasound of the right arm on 03/17/2015 revealed a cystic lesion overlying the olecranon process of the proximal ulna. No solid mass.  Objective:  Vital signs in last 24 hours:  Blood pressure 165/52, pulse 65, temperature 97.6 F (36.4 C), temperature source Oral, resp. rate 17, height '5\' 6"'$  (1.676 m), weight 177 lb 8 oz (80.513 kg), SpO2 100 %.    HEENT: Neck without mass Lymphatics: No cervical, supraclavicular, axillary, or inguinal nodes Resp: Lungs clear bilaterally Cardio: Regular rate and rhythm GI: No hepatosplenomegaly, no mass, nontender Vascular: No leg edema  Skin: 3 cm soft mobile lesion overlying the posterior surface of the right upper forearm near the olecranon.     Lab Results:  Lab Results  Component Value Date   WBC 2.4 Repeated and verified X2.* 03/11/2015   HGB 10.6* 03/11/2015   HCT 32.0* 03/11/2015   MCV 81.3 03/11/2015   PLT 249.0 03/11/2015   NEUTROABS 2.6 12/31/2014     Medications: I have reviewed the patient's current medications.  Assessment/Plan: 1. Non-Hodgkin's Lymphoma-bone marrow biopsy 07/24/2014 consistent with involvement by follicular B-cell lymphoma, CD20 positive   CTs of the chest 07/22/2014 and CT of the abdomen and pelvis on 07/23/2014-pulmonary nodules, hilar/mediastinal/supraclavicular/axillary adenopathy, splenomegaly, abdominal adenopathy, bilateral adrenal nodules   Cycle 1 bendamustine/rituximab 08/04/2014  Cycle 2 bendamustine/Rituxan 09/01/2014  Cycle 3 bendamustine/Rituxan 09/30/2014  Cycle 4 bendamustine/rituximab 10/27/2014  Restaging CT scans 11/17/2014 with significant improvement in diffuse lymphadenopathy, lung  nodules, and renal lesions 2. Severe microcytic anemia, status post transfusion with packed red blood cells 07/23/2014   Normal ferritin, low transferrin saturation, "scant" bone marrow iron stores, review of peripheral blood smear consistent with iron deficiency anemia   colonoscopy and upper endoscopy a 31 2016 3. Exertional dyspnea /orthopnea-most likely secondary to congestive heart failure  4. History of a B-12 deficiency  5. Rash over the trunk and extremities 08/18/2014. Appears consistent with a drug rash. Resolved 08/28/2014.  Rash 11/18/2014, likely secondary to a contrast allergy , diagnostic contrast will be listed as an allergy 6. Colonoscopy 06/03/2008. Medium sized internal hemorrhoids.  7. Admission with atrial flutter with a rapid ventricular response 10/15/2014-status post cardioversion 10/16/2014, maintained on  apixaban  Repeat cardioversion 11/30/2014  8. Right olecranon skin lesion-likely a lipoma or cyst   Disposition:  Amanda Sellers remains in clinical remission from non-Hodgkin's lymphoma. She will contact us if the olecranon lesion enlarges. She will return for an office visit in 4 months.  [name], MD  03/29/2015  9:52 AM

## 2015-03-29 NOTE — Telephone Encounter (Signed)
Gave and printed appt sched and avs for pt for July  °

## 2015-04-13 ENCOUNTER — Telehealth: Payer: Self-pay | Admitting: *Deleted

## 2015-04-13 DIAGNOSIS — R942 Abnormal results of pulmonary function studies: Secondary | ICD-10-CM

## 2015-04-13 NOTE — Telephone Encounter (Signed)
Spoke to patient's daughter Alyson Locket.  Result given . Verbalized understanding  Aware patient needs appointment to follow up with  Pulmonologist. Prefer a mon, tues,or wed  Appointment Referral sent.

## 2015-04-13 NOTE — Telephone Encounter (Signed)
Appoint with Dr. Teressa Lower, Monday 05/10/15 @ 12 noon---[address]  Phone # [phone].  Shelia voiced her understanding

## 2015-04-13 NOTE — Telephone Encounter (Signed)
-----   Message from Leonie Man, MD sent at 03/02/2015  6:17 PM EST ----- PFT results - Noted a diffusion defect; This is a baseline reading.  Pulmonary Function Diagnosis: Restrictive process -Possible Moderate Diffusion Defect -Pulmonary Vascular  I would like to refer her to [facility] to interpret results & determine if any additional studies are warranted. Also to opine on potential risk of Amiodarone toxicity.  Leonie Man, MD

## 2015-04-19 ENCOUNTER — Telehealth: Payer: Self-pay | Admitting: Cardiology

## 2015-04-19 NOTE — Telephone Encounter (Signed)
Returned call to patient.Eliquis 5 mg samples left at front desk of [facility] office. 

## 2015-04-19 NOTE — Telephone Encounter (Signed)
°  Patient calling the office for samples of medication: ° ° °1.  What medication and dosage are you requesting samples for? °Eliquis  °2.  Are you currently out of this medication?  °Yes  ° ° °

## 2015-05-10 ENCOUNTER — Ambulatory Visit (INDEPENDENT_AMBULATORY_CARE_PROVIDER_SITE_OTHER)
Admission: RE | Admit: 2015-05-10 | Discharge: 2015-05-10 | Disposition: A | Discharge status: Home / Self Care | Payer: PPO | Source: Ambulatory Visit | Attending: Pulmonary Disease | Admitting: Pulmonary Disease

## 2015-05-10 ENCOUNTER — Encounter: Payer: Self-pay | Admitting: Pulmonary Disease

## 2015-05-10 ENCOUNTER — Ambulatory Visit (INDEPENDENT_AMBULATORY_CARE_PROVIDER_SITE_OTHER): Payer: PPO | Admitting: Pulmonary Disease

## 2015-05-10 ENCOUNTER — Other Ambulatory Visit (INDEPENDENT_AMBULATORY_CARE_PROVIDER_SITE_OTHER): Payer: PPO

## 2015-05-10 VITALS — BP 124/70 | HR 63 | Temp 97.5°F | Ht 66.0 in | Wt 183.0 lb

## 2015-05-10 DIAGNOSIS — Z862 Personal history of diseases of the blood and blood-forming organs and certain disorders involving the immune mechanism: Secondary | ICD-10-CM

## 2015-05-10 DIAGNOSIS — C8290 Follicular lymphoma, unspecified, unspecified site: Secondary | ICD-10-CM | POA: Diagnosis not present

## 2015-05-10 DIAGNOSIS — D508 Other iron deficiency anemias: Secondary | ICD-10-CM | POA: Diagnosis not present

## 2015-05-10 DIAGNOSIS — R942 Abnormal results of pulmonary function studies: Secondary | ICD-10-CM

## 2015-05-10 DIAGNOSIS — Z79899 Other long term (current) drug therapy: Secondary | ICD-10-CM | POA: Diagnosis not present

## 2015-05-10 DIAGNOSIS — J449 Chronic obstructive pulmonary disease, unspecified: Secondary | ICD-10-CM | POA: Diagnosis not present

## 2015-05-10 LAB — CBC WITH DIFFERENTIAL/PLATELET
Basophils Absolute: 0 10*3/uL (ref 0.0–0.1)
Basophils Relative: 0.3 % (ref 0.0–3.0)
Eosinophils Absolute: 0.1 10*3/uL (ref 0.0–0.7)
Eosinophils Relative: 2.4 % (ref 0.0–5.0)
HCT: 32.9 % — ABNORMAL LOW (ref 36.0–46.0)
Hemoglobin: 11 g/dL — ABNORMAL LOW (ref 12.0–15.0)
Lymphocytes Relative: 21.3 % (ref 12.0–46.0)
Lymphs Abs: 1.1 10*3/uL (ref 0.7–4.0)
MCHC: 33.4 g/dL (ref 30.0–36.0)
MCV: 83.8 fl (ref 78.0–100.0)
Monocytes Absolute: 0.6 10*3/uL (ref 0.1–1.0)
Monocytes Relative: 11.3 % (ref 3.0–12.0)
Neutro Abs: 3.4 10*3/uL (ref 1.4–7.7)
Neutrophils Relative %: 64.7 % (ref 43.0–77.0)
Platelets: 208 10*3/uL (ref 150.0–400.0)
RBC: 3.93 Mil/uL (ref 3.87–5.11)
RDW: 18 % — ABNORMAL HIGH (ref 11.5–15.5)
WBC: 5.2 10*3/uL (ref 4.0–10.5)

## 2015-05-10 LAB — IBC PANEL
Iron: 52 ug/dL (ref 42–145)
Saturation Ratios: 11.9 % — ABNORMAL LOW (ref 20.0–50.0)
Transferrin: 313 mg/dL (ref 212.0–360.0)

## 2015-05-10 LAB — COMPREHENSIVE METABOLIC PANEL
ALT: 16 U/L (ref 0–35)
AST: 19 U/L (ref 0–37)
Albumin: 4.3 g/dL (ref 3.5–5.2)
Alkaline Phosphatase: 62 U/L (ref 39–117)
BUN: 19 mg/dL (ref 6–23)
CO2: 31 mEq/L (ref 19–32)
Calcium: 9.6 mg/dL (ref 8.4–10.5)
Chloride: 103 mEq/L (ref 96–112)
Creatinine, Ser: 1.13 mg/dL (ref 0.40–1.20)
GFR: 49.14 mL/min — ABNORMAL LOW (ref >60.00)
Glucose, Bld: 110 mg/dL — ABNORMAL HIGH (ref 70–99)
Potassium: 4.8 mEq/L (ref 3.5–5.1)
Sodium: 142 mEq/L (ref 135–145)
Total Bilirubin: 0.4 mg/dL (ref 0.2–1.2)
Total Protein: 7.2 g/dL (ref 6.0–8.3)

## 2015-05-10 LAB — T3, FREE: T3, Free: 3 pg/mL (ref 2.3–4.2)

## 2015-05-10 LAB — FERRITIN: Ferritin: 36.5 ng/mL (ref 10.0–291.0)

## 2015-05-10 LAB — FOLATE: Folate: >23.7 ng/mL (ref >5.9)

## 2015-05-10 LAB — T4, FREE: Free T4: 1.31 ng/dL (ref 0.60–1.60)

## 2015-05-10 LAB — VITAMIN B12: Vitamin B-12: 210 pg/mL — ABNORMAL LOW (ref 211–911)

## 2015-05-10 LAB — TSH: TSH: 3.3 u[IU]/mL (ref 0.35–4.50)

## 2015-05-10 NOTE — Patient Instructions (Signed)
Amanda Sellers-- it was great meeting you today...  We discussed your abnormal pulmonary function test in light of your previous NEG pulmonary history & non-smoking status...    We reviewed how your ANEMIA may be playing a roll here but the concern is the AMIODARONE therapy...  Today we rechecked your CXR & LAB work...    We will contact you w/ the results when available...   For now continue your current meds the same...  Let's plan a follow up visit in 6-8 weeks, sooner if needed for any breathing problems.Marland KitchenMarland Kitchen

## 2015-05-11 ENCOUNTER — Encounter: Payer: Self-pay | Admitting: Pulmonary Disease

## 2015-05-11 ENCOUNTER — Encounter (HOSPITAL_COMMUNITY): Payer: Self-pay | Admitting: Nurse Practitioner

## 2015-05-11 ENCOUNTER — Ambulatory Visit (HOSPITAL_COMMUNITY)
Admission: RE | Admit: 2015-05-11 | Discharge: 2015-05-11 | Disposition: A | Discharge status: Home / Self Care | Payer: PPO | Source: Ambulatory Visit | Attending: Nurse Practitioner | Admitting: Nurse Practitioner

## 2015-05-11 VITALS — BP 128/82 | HR 65 | Ht 66.0 in | Wt 183.6 lb

## 2015-05-11 DIAGNOSIS — Z7901 Long term (current) use of anticoagulants: Secondary | ICD-10-CM | POA: Diagnosis not present

## 2015-05-11 DIAGNOSIS — E785 Hyperlipidemia, unspecified: Secondary | ICD-10-CM | POA: Diagnosis not present

## 2015-05-11 DIAGNOSIS — Z79899 Other long term (current) drug therapy: Secondary | ICD-10-CM

## 2015-05-11 DIAGNOSIS — E559 Vitamin D deficiency, unspecified: Secondary | ICD-10-CM | POA: Diagnosis not present

## 2015-05-11 DIAGNOSIS — I481 Persistent atrial fibrillation: Secondary | ICD-10-CM | POA: Insufficient documentation

## 2015-05-11 DIAGNOSIS — F419 Anxiety disorder, unspecified: Secondary | ICD-10-CM | POA: Insufficient documentation

## 2015-05-11 DIAGNOSIS — Z9221 Personal history of antineoplastic chemotherapy: Secondary | ICD-10-CM | POA: Insufficient documentation

## 2015-05-11 DIAGNOSIS — M7021 Olecranon bursitis, right elbow: Secondary | ICD-10-CM | POA: Insufficient documentation

## 2015-05-11 DIAGNOSIS — C7800 Secondary malignant neoplasm of unspecified lung: Secondary | ICD-10-CM | POA: Diagnosis not present

## 2015-05-11 DIAGNOSIS — I5032 Chronic diastolic (congestive) heart failure: Secondary | ICD-10-CM | POA: Diagnosis not present

## 2015-05-11 DIAGNOSIS — I110 Hypertensive heart disease with heart failure: Secondary | ICD-10-CM | POA: Diagnosis not present

## 2015-05-11 DIAGNOSIS — R0600 Dyspnea, unspecified: Secondary | ICD-10-CM | POA: Diagnosis not present

## 2015-05-11 DIAGNOSIS — D61810 Antineoplastic chemotherapy induced pancytopenia: Secondary | ICD-10-CM | POA: Diagnosis not present

## 2015-05-11 DIAGNOSIS — E538 Deficiency of other specified B group vitamins: Secondary | ICD-10-CM | POA: Diagnosis not present

## 2015-05-11 DIAGNOSIS — Z0189 Encounter for other specified special examinations: Secondary | ICD-10-CM | POA: Diagnosis not present

## 2015-05-11 DIAGNOSIS — C8510 Unspecified B-cell lymphoma, unspecified site: Secondary | ICD-10-CM | POA: Diagnosis not present

## 2015-05-11 DIAGNOSIS — E039 Hypothyroidism, unspecified: Secondary | ICD-10-CM | POA: Diagnosis not present

## 2015-05-11 MED ORDER — APIXABAN 5 MG PO TABS: 5.0000 mg | ORAL_TABLET | Freq: Two times a day (BID) | ORAL | Status: DC

## 2015-05-11 NOTE — Progress Notes (Signed)
Subjective:     Patient ID: Amanda Sellers, female   DOB: 1932-09-24, 82 y.o.   MRN: [id number]  HPI ~  May 10, 2015:  Initial Pulmonary consultation by SN>  She is here today w/ her daughter Amanda Sellers, an Therapist, sports from the ICU at [facility];  Her PCP is DrPMcGowen in [place]...    82 y/o WF referred by DrHarding for a decr DLCO on Amiodarone for AFib... She notes DOE w/ exertion but states that it's getting better w/ a walking regimen & feels that she is getting stronger;  She has a hx of a follicular lymphoma and had chemoRx from DrSherrill (his note of 03/29/15 is reviewed)- diagnosed 06/2014 & treated w/ 4 cycles of bendamustine/ rituximab w/ f/u CT showing signif improvement in adenopathy, lung nodules and renal lesions;  NOTE> she was placed on O2 for nocturnal use by DrSherrill during her ChemoRx, uses it prn only but doesn't want to give it up!... She saw DrHarding last 01/2015 for f/u AFib & CHF- on Amiodarone since 11/2014;  NOTE> she states her breathing is much better since her successful 2nd DCCV=> NSR...  2DEcho 07/22/14:  Mild LVH, borderline LVF w/ EF=50-55%, no regional wall motion abn, Gr2DD, trivAI, calcif mitral annulus, mod LAdil at 20m, PAsys=380mg  Myoview 10/29/2014:  There is a medium defect of mild severity present in the mid anterior and apical anterior location. This is a low risk study. Breast attenuation artifact Study would not gate secondary to afib and PVCs.  TEE 10/16/2014: Normal LV function (EF 55-60%); biatrial enlargment; spontaneous contrast in LAA but no thrombus; mild AI; moderate MR (2+); mild TR.  Smoking Hx>  She is a never smoker.  Pulmonary Hx>  No prev hx of lung dis- denies hx asthma, no prev bouts of bronchitis, never had pneumonia, no hx TB or known exposure.    Cardiac Hx>  Adm 09/2014 w/ AFlutter & rvr, s/p cardioversion x2 (last 11/2014); HBP, Chronic diastolic CHF, Valvular dis w/ modMR & mild AI  Medical Hx>  Hx non-Hodgkins Lymphoma 06/2014 treated w/  bendamustine/ rituximab by DrSherrill; Hx microcytic anemia & scant Fe stores c/w IDA, hx of B12 defic- prev on shots, Hypothyroid, Anxiety  Family Hx>  FamHx is NEG for any lung diseases...  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  Current Meds>  Last ChemoRx 10/2015; Amio200, Eliquis5Bid, ToprolXL25Bid, Lasix40AM&20PM, K20, Prav80, Synthroid100, Xanax0.5, Fe-Bid, VitD,   EXAM shows Afeb, VSS, O2sat=97% on RA at rest;  HEENT- neg, mallampati2;  Chest- clear w/o w/r/r;  Heart- RR Gr1/6 SEM w/o r/g;  Abd- soft, nontender, neg;  Ext- neg w/o c/c/e;  Neuro- intact...   Last CT Chest 11/17/14>  Improved thor adenopathy, modHH, norm heart size, diffuse atherosclerosis, R>L pleural effusions, diminished bilat pulm nodules (largest 28m47mIn lingula), mild subpleural reticulation & interstitial prom  Last CXR 11/25/14>  Cardiomegaly & pul vasc congestion, atherosclerotic calcif in Ao, peribronch thickening & flattened diaph w/ bibasilar atx, osteopenia and T12 compression (chronic)  PFTs 02/26/15>  FVC=2.12 (74%), FEV1=1.70 (80%), %1sec=80, mid-flows wnl at 91% predicted; TLC=5.41 (101%), RV=3.42 (136%), RV/TLC=63%; DLCO=56% predicted (DL/VA=82% predicted)  CXR today 05/10/15>  Borderline heart size, some hyperinflation noted, clear lungs, NAD...   LABS 05/10/15>  Chems- ok x BS=110, Cr=1.13;  CBC- Hg=11.0, MCV=84;  Fe=52 (12%sat), Ferritin=37;  TSH=3.30, FreeT4=1.31;  B12=210;  Folate>23  IMP >>  Abnormal PFT w/ norm airflow, norm lung volumes, air trapping & decr DLCO=56%>  This is a baseline PFT study done 02/2015 & Amio was started 10/2014; DLCO also affected by lung vol & anemia...    CT Chest 10/2014 had some CHF w/ interstitial prominence, sm bilat effusions, but improved adenopathy & diminished pulm nodules s/o chemotherapy for lymphoma    HBP> on MetopER25Bid, Lasix40AM&20PM, K20    Hx PAF> on Eliquis5Bid,  Amiodarone200; she is holding NSR on these meds...    Chr diastolic CHF> on meds above; last BNP was 11/2014= 378...    Hyperlipidemia> on Prav80 + Fish Oil    Hypothyroid> on Synthroid100    Osteopenia and T12 compression    Hx non-hodgkins lymphoma> dx 6/16 & treated w/ 4 cycles chemoRx (last 10/28/14)    Anemia> on FeBid since 6/16 & followed by DrSherrill; last CBC 03/11/15 showed Hg=10.6, MCV=81; will discuss w/ DrSherrill re: poss IV iron therapy & she definitely needs to get back on her B12 supplementation.    Hx B12 defic> she is not currently taking B12 shots or supplements  PLAN >>     Amanda Sellers indicates that her DOE is improving, getting better "It helps me to walk" she notes;  We are indered by the timing of her studies- starting on Amio 10/2014, last CTChest 10/2014, 1st PFT 02/2015, her chronic diastolic CHF, mild anemia, etc... She will continue same meds, continue to exercise, f/u w/ Oncology & we will recheck pt in 6 weeks...    Past Medical History  Diagnosis Date  . Vitamin B 12 deficiency   . HTN (hypertension)   . Vitamin D deficiency   . HLD (hyperlipidemia)      06/2014  . Chronic diastolic congestive heart failure ([place])     Echo 07/23/14: mild LVH, EF 50-55%, grade 2 diast dysfxn  . Microcytic anemia     transfused 3 U total in hosp 07/2014  . Positive occult stool blood test 08/08/14    Endoscopies ok 08/2014  . Hypothyroidism   . Pancytopenia due to antineoplastic chemotherapy ([place])   . Atrial fibrillation ([place])     With RVR; elec cardioversion 11/30/14  . Anticoagulation adequate, Eliquis 10/16/2014  . Chronic diastolic congestive heart failure ([place]) 04/07/1759    Complicated by atrial fibrillation  . Anxiety   . Pulmonary metastases ([place]) 07/24/2014  . Follicular lymphoma ([place])     Non Hodgkins B cell lymphoma; s/p chemo summer 2016--in remission as of 11/2014.  Marland Kitchen Olecranon bursitis of right elbow 02/2015    Past Surgical History  Procedure Laterality Date  .  Breast biopsy    . Bone marrow biopsy  07/24/14  . Abdominal hysterectomy  1978  . Transthoracic echocardiogram  07/23/14    mild LVH, EF 50-55%, grade 2 diast dysfxn  . Upper gi endoscopy  09/23/2014    small hiatus hernia, nodules in stomach biopsied (chronic active erosive atrophic gastritis with intestinal metaplasia--no dysplasia or malignancy) otherwise normal  . Colonoscopy  09/23/14    small hemorrhoids, otherwise normal (performed for IDA and heme+ stool)  . Tee without cardioversion N/A 10/16/2014    Procedure: TRANSESOPHAGEAL ECHOCARDIOGRAM (TEE);  Surgeon: Lelon Perla, MD;  Location[facility] ENDOSCOPY;  Service: Cardiovascular;  Laterality: N/A;  . Cardioversion N/A 10/16/2014    Procedure: CARDIOVERSION;  Surgeon: Lelon Perla, MD;  Location[facility] ENDOSCOPY;  Service: Cardiovascular;  Laterality: N/A;  . Nm myoview ltd  10/29/2014    medium size mild  surgery defect in the mid anterior and apical anterior location suggestive of breast attenuation. No reversibility. LOW RISK  . Cardioversion N/A 11/30/2014    Procedure: CARDIOVERSION;  Surgeon: Larey Dresser, MD;  Location[facility] ENDOSCOPY;  Service: Cardiovascular;  Laterality: N/A;  . Pfts  02/2015    Restriction with diffusion defect: cardiologist referred her to pulm to help interpret PFTs and decide whether she has amiodarone toxicity    Outpatient Encounter Prescriptions as of 05/10/2015  Medication Sig  . ALPRAZolam (XANAX) 0.5 MG tablet 1 tab po qhs prn anxiety-related insomnia  . amiodarone (PACERONE) 200 MG tablet Take 1 tablet (200 mg total) by mouth daily.  . ergocalciferol (VITAMIN D2) 50000 UNITS capsule Take 50,000 Units by mouth once a week. On Sundays  . ferrous sulfate 325 (65 FE) MG tablet Take 1 tablet (325 mg total) by mouth 2 (two) times daily with a meal.  . fish oil-omega-3 fatty acids 1000 MG capsule Take 2 g by mouth daily.  . furosemide (LASIX) 40 MG tablet Take 53m by mouth in AM and take 25mby mouth in  PM  . levothyroxine (SYNTHROID, LEVOTHROID) 100 MCG tablet Take 1 tablet (100 mcg total) by mouth daily before breakfast.  . metoprolol succinate (TOPROL-XL) 25 MG 24 hr tablet Take 25 mg by mouth 2 (two) times daily.  . OXYGEN Inhale into the lungs. 1Lt at bedtime  . potassium chloride 20 MEQ TBCR Take 20 mEq by mouth daily.  . pravastatin (PRAVACHOL) 80 MG tablet Take 1 tablet (80 mg total) by mouth daily.  . Marland KitchenRESCRIPTION MEDICATION Chemo. [facility] . Marland Kitchenrochlorperazine (COMPAZINE) 10 MG tablet Take 10 mg by mouth every 8 (eight) hours as needed for nausea or vomiting.  . [DISCONTINUED] apixaban (ELIQUIS) 5 MG TABS tablet Take 1 tablet (5 mg total) by mouth 2 (two) times daily.   No facility-administered encounter medications on file as of 05/10/2015.    Allergies  Allergen Reactions  . Iodinated Diagnostic Agents Rash    CT Contrast    Immunization History  Administered Date(s) Administered  . Influenza,inj,Quad PF,36+ Mos 10/27/2014  . Pneumococcal-Unspecified 01/23/2014  . Tdap 12/09/2014  . Zoster 03/19/2012    Family History  Problem Relation Age of Onset  . Melanoma Mother   . Stomach cancer Father   . Lymphoma Sister   . Diabetes Daughter     Social History   Social History  . Marital Status: Married    Spouse Name: N/A  . Number of Children: 8  . Years of Education: N/A   Occupational History  . retired    Social History Main Topics  . Smoking status: Never Smoker   . Smokeless tobacco: Never Used  . Alcohol Use: No  . Drug Use: No  . Sexual Activity: Not on file   Other Topics Concern  . Not on file   Social History Narrative   Widowed, has 8 children.   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   No tob/alc/drugs.    Current Medications, Allergies, Past Medical History, Past Surgical History, Family History, and Social History were reviewed in  [employer].   Review of Systems             All symptoms NEG except where BOLDED >>  Constitutional:  F/C/S, fatigue, anorexia, unexpected weight change. HEENT:  HA,  visual changes, hearing loss, earache, nasal symptoms, sore throat, mouth sores, hoarseness. Resp:  cough, sputum, hemoptysis; SOB, tightness, wheezing. Cardio:  CP, palpit, DOE, orthopnea, edema. GI:  N/V/D/C, blood in stool; reflux, abd pain, distention, gas. GU:  dysuria, freq, urgency, hematuria, flank pain, voiding difficulty. MS:  joint pain, swelling, tenderness, decr ROM; neck pain, back pain, etc. Neuro:  HA, tremors, seizures, dizziness, syncope, weakness, numbness, gait abn. Skin:  suspicious lesions or skin rash. Heme:  adenopathy, bruising, bleeding. Psyche:  confusion, agitation, sleep disturbance, hallucinations, anxiety, depression suicidal.   Objective:   Physical Exam       Vital Signs:  Reviewed...  General:  WD, WN, 82 y/o WF in NAD; alert & oriented; pleasant & cooperative... HEENT:  [place]/AT; Conjunctiva- pink, Sclera- nonicteric, EOM-wnl, PERRLA, EACs-clear, TMs-wnl; NOSE-clear; THROAT-clear & wnl. Neck:  Supple w/ fair ROM; no JVD; normal carotid impulses w/o bruits; no thyromegaly or nodules palpated; no lymphadenopathy. Chest:  Clear to P & A; without wheezes, rales, or rhonchi heard. Heart:  Regular Rhythm; Gr1/6 SEM, without rubs or gallops detected. Abdomen:  Soft & nontender- no guarding or rebound; normal bowel sounds; no organomegaly or masses palpated. Ext:  decrROM; without deformities +arthritic changes; no varicose veins, venous insuffic, or edema;  Pulses intact w/o bruits. Neuro:  CNs II-XII intact; motor testing normal; sensory testing normal; gait normal & balance OK. Derm:  No lesions noted; no rash etc. Lymph:  No cervical, supraclavicular, axillary, or inguinal adenopathy palpated.   Assessment:      IMP >>     Abnormal PFT w/ norm airflow, norm  lung volumes, air trapping & decr DLCO=56%>  This is a baseline PFT study done 02/2015 & Amio was started 10/2014; DLCO also affected by lung vol & anemia...    CT Chest 10/2014 had some CHF w/ interstitial prominence, sm bilat effusions, but improved adenopathy & diminished pulm nodules s/o chemotherapy for lymphoma    HBP> on MetopER25Bid, Lasix40AM&20PM, K20    Hx PAF> on Eliquis5Bid, Amiodarone200; she is holding NSR on these meds...    Chr diastolic CHF> on meds above; last BNP was 11/2014= 378...    Hyperlipidemia> on Prav80 + Fish Oil    Hypothyroid> on Synthroid100    Osteopenia and T12 compression    Hx non-hodgkins lymphoma> dx 6/16 & treated w/ 4 cycles chemoRx (last 10/28/14)    Anemia> on FeBid since 6/16 & followed by DrSherrill; last CBC 03/11/15 showed Hg=10.6, MCV=81; will discuss w/ DrSherrill re: poss IV iron therapy & she definitely needs to get back on her B12 supplementation.    Hx B12 defic> she is not currently taking B12 shots or supplements  PLAN >>     [place] indicates that her DOE is improving, getting better "It helps me to walk" she notes;  We are indered by the timing of her studies- starting on Amio 10/2014, last CTChest 10/2014, 1st PFT 02/2015, her chronic diastolic CHF, mild anemia, etc... She will continue same meds, continue to exercise, f/u w/ Oncology & we will recheck pt in 6 weeks...      Plan:     Patient's Medications  New Prescriptions   No medications on file  Previous Medications   ALPRAZOLAM (XANAX) 0.5 MG TABLET    1 tab po qhs prn anxiety-related insomnia   AMIODARONE (PACERONE) 200 MG TABLET    Take 1 tablet (200 mg total) by mouth daily.   ERGOCALCIFEROL (VITAMIN D2) 50000 UNITS CAPSULE  Take 50,000 Units by mouth once a week. On Sundays   FERROUS SULFATE 325 (65 FE) MG TABLET    Take 1 tablet (325 mg total) by mouth 2 (two) times daily with a meal.   FISH OIL-OMEGA-3 FATTY ACIDS 1000 MG CAPSULE    Take 2 g by mouth daily.   FUROSEMIDE  (LASIX) 40 MG TABLET    Take 64m by mouth in AM and take 237mby mouth in PM   LEVOTHYROXINE (SYNTHROID, LEVOTHROID) 100 MCG TABLET    Take 1 tablet (100 mcg total) by mouth daily before breakfast.   METOPROLOL SUCCINATE (TOPROL-XL) 25 MG 24 HR TABLET    Take 25 mg by mouth 2 (two) times daily.   OXYGEN    Inhale into the lungs. 1Lt at bedtime   POTASSIUM CHLORIDE 20 MEQ TBCR    Take 20 mEq by mouth daily.   PRAVASTATIN (PRAVACHOL) 80 MG TABLET    Take 1 tablet (80 mg total) by mouth daily.   PRESCRIPTION MEDICATION    [facility]. [facility]  PROCHLORPERAZINE (COMPAZINE) 10 MG TABLET    Take 10 mg by mouth every 8 (eight) hours as needed for nausea or vomiting.  Modified Medications   Modified Medication Previous Medication   APIXABAN (ELIQUIS) 5 MG TABS TABLET apixaban (ELIQUIS) 5 MG TABS tablet      Take 1 tablet (5 mg total) by mouth 2 (two) times daily.    Take 1 tablet (5 mg total) by mouth 2 (two) times daily.  Discontinued Medications   No medications on file

## 2015-05-11 NOTE — Progress Notes (Signed)
Patient ID: Amanda Sellers, female   DOB: Sep 21, 1932, 82 y.o.   MRN: [id number]           Primary Care Physician: Tammi Sou, MD Referring Physician: Dr. Ronni Rumble Amanda Sellers is a 82 y.o. female with a h/o  diastolic heart failure, afib, newly diagnosed with recent dx of lymphoma. She was admitted to [facility] 9/22 on IV dilt and given diuretics for acute on chronic systolic HF. It was arranged for to undergo TEE DCCV.  She had exertional dyspnea with PND & orthopnea.She felt very tired & had little energy. She  noted a few palpitations, but does not recall rapid irregular heart rates/rhythms.   She had TEE without thrombus and underwent DCCV that was successful with one shock of 120J. She did well post procedure and was seen by Dr. Jerilynn Mages. Croitoru and found stable for discharge. She was negative 2700cc since admit and weight was down 4 lbs with IV diuretics. D/C wt is 168.  Plan on d/c was for  uninterrupted anticoagulation for next 30 days, preferably lifelong barring bleeding complications. AFib clinic follow up in 1-2 weeks. Outpatient [facility], non-urgent. ASA stopped.   She was seen in the afib clinic, 10/10 and found to be in afib with rvr. She went into afib short time after cardioversion. EKG showed afib at 130-140 bpm. She was  not on any drugs for rate control.  Is taking DOAC without fail. Continues with chemotherapy for lymphoma.   She was started on BB and  returned to clinic 10/12 with better v rates. 80's with sitting and 90-100's with activity. She does not have any PND/orthopnea although she has slept better in her recliner for months now.No lower extremity edema. Her weight is stable at home.  She has  finished chemotherapy for non- hodgkin's lymphoma and had f/u Pet scan 10/26,  and CA appears to be in remission, so per pt, no more chemo for right now. She has been on DOAC now x one month so will start attempts to restore SR. Discussed options  amiodarone or tikosyn and she would prefer amiodarone as she has two neighbors on amiodarone and they are both in rhythm and tolerating drug well. She is not interested in a hospital stay for drug loading. She reports rash with recent contrast dye. Amiodarone was started 200 mg bid, to then go down to 200 mg a day and then would discuss cardioversion.  She asked to be seen 11/2,  in the afib clinic for increased shortness of breath yesterday and last night. She has slept in the recliner for months but did not feel as well last night and slept poorly, feeling more short of breath and fullness in her chest.She has noticed increased pedal edema. She did not take her amiodarone last night or this am. Ekg shows rate controlled afib at 82 bpm, qtc at 464 ms.PO on RA is 98%. Weight is up 2 lbs from last visit, 6 lbs since 10/17. Lasix dose was decreased to 40 mg daily from 40 mg am and 20 mg pm when seen by Dr. Ellyn Hack 10/22.. Pt has noticed weight climbing for several days. She is fairly comfortable when quiet and sitting.   She was asked to double lasix to 80 mg  x 3 days, and to reduce amiodarone 200 mg a day. She had lost 3 pounds on return to clinic several days later. Clinically, she is much improved with more energy and less shortness of breath.  Able to sleep well. Had enough energy to walk into clinic today without wheelchair and felt well enough to clean her house yesterday. Ideally,I would like to load amiodarone a while longer but will go ahead and  pursue DCCV sooner than later, as it is contributing to her heart failure. Compliant with blood thinner without missed doses On recent labs, TSH was elevated but daughter explained that she just started taking replacement correctly on an empty stomach, and sees PCP 11/16 and she expects him to adjust the dose.  11/14-  Returns to afib clinic with a smile on her face and reporting all the activities she has been doing over the last week, having returned to  sinus rhythm after cardioversion. Much more energy and breathing is much improved.  Fluid status is stable.  12/13- Returns to afib clinic for f/u and reports doing well No afib, no further shortness of breath or edema. She is back to her usual activities. Got a good report form the Ca unit recently. Tolerating amiodarone 200 mg a day.  Return to afib clinic 4/18, reports that she is staying in [place]. Saw pulmonology yesterday for some c/o of shortness of breath but breathing much improved since staying in SR. Xray no active process, thyroid, liver studies drawn yesterday, and normal. MD said he would call hr after results of tests were reviewed. Fluid status normal.  Today, she denies symptoms of palpitations, chest pain,  orthopnea, PND, mild lower extremity edema, no dizziness, presyncope, syncope, or neurologic sequela. Fatigue and mild dyspnea. The patient is tolerating medications without difficulties and is otherwise without complaint today.   Past Medical History  Diagnosis Date  . Vitamin B 12 deficiency   . HTN (hypertension)   . Vitamin D deficiency   . HLD (hyperlipidemia)      06/2014  . Chronic diastolic congestive heart failure ([place])     Echo 07/23/14: mild LVH, EF 50-55%, grade 2 diast dysfxn  . Microcytic anemia     transfused 3 U total in hosp 07/2014  . Positive occult stool blood test 08/08/14    Endoscopies ok 08/2014  . Hypothyroidism   . Pancytopenia due to antineoplastic chemotherapy ([place])   . Atrial fibrillation ([place])     With RVR; elec cardioversion 11/30/14  . Anticoagulation adequate, Eliquis 10/16/2014  . Chronic diastolic congestive heart failure ([place]) 6/57/8469    Complicated by atrial fibrillation  . Anxiety   . Pulmonary metastases ([place]) 07/24/2014  . Follicular lymphoma ([place])     Non Hodgkins B cell lymphoma; s/p chemo summer 2016--in remission as of 11/2014.  Marland Kitchen Olecranon bursitis of right elbow 02/2015   Past Surgical History  Procedure Laterality Date  .  Breast biopsy    . Bone marrow biopsy  07/24/14  . Abdominal hysterectomy  1978  . Transthoracic echocardiogram  07/23/14    mild LVH, EF 50-55%, grade 2 diast dysfxn  . Upper gi endoscopy  09/23/2014    small hiatus hernia, nodules in stomach biopsied (chronic active erosive atrophic gastritis with intestinal metaplasia--no dysplasia or malignancy) otherwise normal  . Colonoscopy  09/23/14    small hemorrhoids, otherwise normal (performed for IDA and heme+ stool)  . Tee without cardioversion N/A 10/16/2014    Procedure: TRANSESOPHAGEAL ECHOCARDIOGRAM (TEE);  Surgeon: Lelon Perla, MD;  Location: [facility];  Service: Cardiovascular;  Laterality: N/A;  . Cardioversion N/A 10/16/2014    Procedure: CARDIOVERSION;  Surgeon: Lelon Perla, MD;  Location: [facility];  Service: Cardiovascular;  Laterality: N/A;  . Nm myoview ltd  10/29/2014    medium size mild surgery defect in the mid anterior and apical anterior location suggestive of breast attenuation. No reversibility. LOW RISK  . Cardioversion N/A 11/30/2014    Procedure: CARDIOVERSION;  Surgeon: Larey Dresser, MD;  Location: [facility];  Service: Cardiovascular;  Laterality: N/A;  . Pfts  02/2015    Restriction with diffusion defect: cardiologist referred her to pulm to help interpret PFTs and decide whether she has amiodarone toxicity    Current Outpatient Prescriptions  Medication Sig Dispense Refill  . ALPRAZolam (XANAX) 0.5 MG tablet 1 tab po qhs prn anxiety-related insomnia 90 tablet 1  . amiodarone (PACERONE) 200 MG tablet Take 1 tablet (200 mg total) by mouth daily. 60 tablet 3  . apixaban (ELIQUIS) 5 MG TABS tablet Take 1 tablet (5 mg total) by mouth 2 (two) times daily. 42 tablet 0  . ergocalciferol (VITAMIN D2) 50000 UNITS capsule Take 50,000 Units by mouth once a week. On Sundays    . ferrous sulfate 325 (65 FE) MG tablet Take 1 tablet (325 mg total) by mouth 2 (two) times daily with a meal. 30 tablet 1  . fish  oil-omega-3 fatty acids 1000 MG capsule Take 2 g by mouth daily.    . furosemide (LASIX) 40 MG tablet Take 104m by mouth in AM and take 268mby mouth in PM 90 tablet 3  . levothyroxine (SYNTHROID, LEVOTHROID) 100 MCG tablet Take 1 tablet (100 mcg total) by mouth daily before breakfast. 30 tablet 6  . metoprolol succinate (TOPROL-XL) 25 MG 24 hr tablet Take 25 mg by mouth 2 (two) times daily.    . OXYGEN Inhale into the lungs. 1Lt at bedtime    . potassium chloride 20 MEQ TBCR Take 20 mEq by mouth daily. 30 tablet 3  . pravastatin (PRAVACHOL) 80 MG tablet Take 1 tablet (80 mg total) by mouth daily. 90 tablet 3  . prochlorperazine (COMPAZINE) 10 MG tablet Take 10 mg by mouth every 8 (eight) hours as needed for nausea or vomiting.    . Marland KitchenRESCRIPTION MEDICATION Chemo. [facility]    No current facility-administered medications for this encounter.    Allergies  Allergen Reactions  . Iodinated Diagnostic Agents Rash    CT Contrast    Social History   Social History  . Marital Status: Married    Spouse Name: N/A  . Number of Children: 8  . Years of Education: N/A   Occupational History  . retired    Social History Main Topics  . Smoking status: Never Smoker   . Smokeless tobacco: Never Used  . Alcohol Use: No  . Drug Use: No  . Sexual Activity: Not on file   Other Topics Concern  . Not on file   Social History Narrative   Widowed, has 8 children.   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   No tob/alc/drugs.    Family History  Problem Relation Age of Onset  . Melanoma Mother   . Stomach cancer Father   . Lymphoma Sister   . Diabetes Daughter     ROS- All systems are reviewed and negative except as per the HPI above  Physical Exam: Filed Vitals:   05/11/15 1100  BP: 128/82  Pulse: 65  Height: '5\' 6"'  (1.676 m)  Weight: 183 lb 9.6  oz (83.28 kg)    GEN- The  patient is well appearing, alert and oriented x 3 today.   Head- normocephalic, atraumatic Eyes-  Sclera clear, conjunctiva pink Ears- hearing intact Oropharynx- clear Neck- supple, no JVP Lymph- no cervical lymphadenopathy Lungs- Clear to ausculation bilaterally, normal work of breathing Heart-Regular rate and rhythm, no murmurs, rubs or gallops, PMI not laterally displaced GI- soft, NT, ND, + BS Extremities- no clubbing, cyanosis,  1+ edema, improved from recent 2+ edema MS- no significant deformity or atrophy Skin- no rash or lesion Psych- euthymic mood, full affect Neuro- strength and sensation are intact  EKG- Sinus rhythm with first degree av block, with v rate 65 bpm, pr int 226 ms, qrs int 92 ms, qtc 492 ms Epic records reviewed. Labs reviewed  Assessment and Plan: 1. Persistent symptomatic afib  Successful cardioversion 11/17, maintaining  SR Continue amiodarone 200 mg qd Continue metoprolol  25 mg bid   Continue apixaban for chadsvasc score of at least 4, has not missed any doses  2. Non Hodgkns B cell lymphoma S/p chemotherapy Stable  3. Diastolic heart failure Continue Lasix 40 am and 20 mg pm Fluid status stable Symptoms improved with return to SR  4.Dyspnea Mild per pt Per pulmonology, awaiting results of pulmonology visit yesterday If needed can try to reduce amiodarone to 100 mg a day  F/u with Dr. Ellyn Hack in July Afib in nov/dec 2017 or as needed  Amanda Sellers, [facility] [address] [place], [place] [address] [phone]

## 2015-05-20 NOTE — Progress Notes (Signed)
Quick Note:  lmtcb for pt - other results to relay to pt ______

## 2015-05-20 NOTE — Progress Notes (Signed)
Quick Note:  lmtcb for pt. Spoke with SN: pt is needing to stay on oral iron (not IV iron) and will need to start the B12 shots (either with [place] or PCP). ______

## 2015-05-21 ENCOUNTER — Telehealth: Payer: Self-pay | Admitting: Oncology

## 2015-05-21 ENCOUNTER — Other Ambulatory Visit: Payer: Self-pay | Admitting: *Deleted

## 2015-05-21 DIAGNOSIS — C8209 Follicular lymphoma grade I, extranodal and solid organ sites: Secondary | ICD-10-CM

## 2015-05-21 DIAGNOSIS — C7800 Secondary malignant neoplasm of unspecified lung: Secondary | ICD-10-CM

## 2015-05-21 NOTE — Telephone Encounter (Signed)
lvm for pt regarding to added lab for July

## 2015-05-21 NOTE — Progress Notes (Signed)
Quick Note:  lmtcb for pt. ______ 

## 2015-05-25 NOTE — Progress Notes (Signed)
Quick Note:  lmtcb for pt. ______ 

## 2015-05-26 ENCOUNTER — Telehealth: Payer: Self-pay | Admitting: Pulmonary Disease

## 2015-05-26 NOTE — Progress Notes (Signed)
Quick Note:  lmtcb for pt. ______ 

## 2015-05-26 NOTE — Telephone Encounter (Signed)
Amanda Sellers , can you advise on this per lab result note?

## 2015-05-26 NOTE — Telephone Encounter (Signed)
Notes Recorded by Collier Salina, RN on 05/26/2015 at 10:18 AM lmtcb for pt. Notes Recorded by Collier Salina, RN on 05/25/2015 at 5:15 PM lmtcb for pt. Notes Recorded by Collier Salina, RN on 05/21/2015 at 4:33 PM lmtcb for pt. Notes Recorded by Collier Salina, RN on 05/20/2015 at 10:25 AM lmtcb for pt. Spoke with SN: pt is needing to stay on oral iron (not IV iron) and will need to start the B12 shots (either with [place] or PCP). Notes Recorded by Noralee Space, MD on 05/19/2015 at 4:05 PM Please notify patient/ daughter please>  [place]- OK... CBC w/ mild anemia Hg=11.0, Iron is low end of normal (52 & 12%sat) despite oral iron replacement for 15yr- prob needs IV Iron Rx & we will try to arrange thru the cancer center & [facility] B12 is low again (since she has been off her B12 shots & needs to restart these Qmo ~1000 B12 IM Qmo- we can try to get these thru cancer center or her PCP)_... Thyroid is wnl...  Daneil Dan- SEE ME, I put a call into DrSher[facility].   Will call pt on 5.4.17

## 2015-05-27 NOTE — Telephone Encounter (Signed)
Called spoke with pt. Reviewed results and recs. Pt voiced understanding and had no further questions.

## 2015-05-27 NOTE — Progress Notes (Signed)
Quick Note:  Please see phone note from 5.3.2017. Will sign off. ______

## 2015-05-27 NOTE — Telephone Encounter (Signed)
Patient returned call, CB [phone]

## 2015-05-27 NOTE — Progress Notes (Signed)
Quick Note:  Called and spoke to pt. Informed her of the results and recs per SN. Pt verbalized understanding and denied any further questions or concerns at this time. ______ 

## 2015-06-25 ENCOUNTER — Telehealth: Payer: Self-pay | Admitting: *Deleted

## 2015-06-25 ENCOUNTER — Ambulatory Visit (INDEPENDENT_AMBULATORY_CARE_PROVIDER_SITE_OTHER): Payer: PPO | Admitting: Family Medicine

## 2015-06-25 ENCOUNTER — Encounter: Payer: Self-pay | Admitting: Family Medicine

## 2015-06-25 VITALS — BP 133/59 | HR 49 | Temp 98.1°F | Resp 16 | Ht 66.0 in | Wt 188.5 lb

## 2015-06-25 DIAGNOSIS — K130 Diseases of lips: Secondary | ICD-10-CM | POA: Diagnosis not present

## 2015-06-25 MED ORDER — LEVOTHYROXINE SODIUM 100 MCG PO TABS: 100.0000 ug | ORAL_TABLET | Freq: Every day | ORAL | Status: DC

## 2015-06-25 MED ORDER — AMIODARONE HCL 200 MG PO TABS: 200.0000 mg | ORAL_TABLET | Freq: Every day | ORAL | Status: DC

## 2015-06-25 NOTE — Progress Notes (Signed)
Pre visit review using our clinic review tool, if applicable. No additional management support is needed unless otherwise documented below in the visit note. 

## 2015-06-25 NOTE — Progress Notes (Addendum)
OFFICE VISIT  06/25/2015   CC:  Chief Complaint  Patient presents with  . Mass    lower lip left side x 2 weeks   HPI:    Patient is a 82 y.o. Caucasian female who presents for a growth on her lower lip. She noted it come up pretty quickly about 10-14d ago, growing, burns some. She cleans it with peroxide.  She says it does not bleed.  Of note, patient is still currently using her night-time oxygen.   Past Medical History  Diagnosis Date  . Vitamin B 12 deficiency   . HTN (hypertension)   . Vitamin D deficiency   . HLD (hyperlipidemia)      06/2014  . Chronic diastolic congestive heart failure ([place])     Echo 07/23/14: mild LVH, EF 50-55%, grade 2 diast dysfxn  . Microcytic anemia     transfused 3 U total in hosp 07/2014  . Positive occult stool blood test 08/08/14    Endoscopies ok 08/2014  . Hypothyroidism   . Pancytopenia due to antineoplastic chemotherapy ([place])   . Atrial fibrillation ([place])     With RVR; elec cardioversion 11/30/14  . Anticoagulation adequate, Eliquis 10/16/2014  . Chronic diastolic congestive heart failure ([place]) 4/49/6759    Complicated by atrial fibrillation  . Anxiety   . Pulmonary metastases ([place]) 07/24/2014  . Follicular lymphoma ([place])     Non Hodgkins B cell lymphoma; s/p chemo summer 2016--in remission as of 11/2014.  Marland Kitchen Olecranon bursitis of right elbow 02/2015    Past Surgical History  Procedure Laterality Date  . Breast biopsy    . Bone marrow biopsy  07/24/14  . Abdominal hysterectomy  1978  . Transthoracic echocardiogram  07/23/14    mild LVH, EF 50-55%, grade 2 diast dysfxn  . Upper gi endoscopy  09/23/2014    small hiatus hernia, nodules in stomach biopsied (chronic active erosive atrophic gastritis with intestinal metaplasia--no dysplasia or malignancy) otherwise normal  . Colonoscopy  09/23/14    small hemorrhoids, otherwise normal (performed for IDA and heme+ stool)  . Tee without cardioversion N/A 10/16/2014    Procedure: TRANSESOPHAGEAL  ECHOCARDIOGRAM (TEE);  Surgeon: Lelon Perla, MD;  Location: [facility];  Service: Cardiovascular;  Laterality: N/A;  . Cardioversion N/A 10/16/2014    Procedure: CARDIOVERSION;  Surgeon: Lelon Perla, MD;  Location: [facility] ENDOSCOPY;  Service: Cardiovascular;  Laterality: N/A;  . Nm myoview ltd  10/29/2014    medium size mild surgery defect in the mid anterior and apical anterior location suggestive of breast attenuation. No reversibility. LOW RISK  . Cardioversion N/A 11/30/2014    Procedure: CARDIOVERSION;  Surgeon: Larey Dresser, MD;  Location: [facility];  Service: Cardiovascular;  Laterality: N/A;  . Pfts  02/2015    Restriction with diffusion defect: cardiologist referred her to pulm to help interpret PFTs and decide whether she has amiodarone toxicity    Outpatient Prescriptions Prior to Visit  Medication Sig Dispense Refill  . ALPRAZolam (XANAX) 0.5 MG tablet 1 tab po qhs prn anxiety-related insomnia 90 tablet 1  . apixaban (ELIQUIS) 5 MG TABS tablet Take 1 tablet (5 mg total) by mouth 2 (two) times daily. 180 tablet 3  . ergocalciferol (VITAMIN D2) 50000 UNITS capsule Take 50,000 Units by mouth once a week. On Sundays    . ferrous sulfate 325 (65 FE) MG tablet Take 1 tablet (325 mg total) by mouth 2 (two) times daily with a meal. 30 tablet 1  . fish  oil-omega-3 fatty acids 1000 MG capsule Take 2 g by mouth daily.    . furosemide (LASIX) 40 MG tablet Take 85m by mouth in AM and take 220mby mouth in PM 90 tablet 3  . metoprolol succinate (TOPROL-XL) 25 MG 24 hr tablet Take 25 mg by mouth 2 (two) times daily.    . OXYGEN Inhale into the lungs. 1Lt at bedtime    . potassium chloride 20 MEQ TBCR Take 20 mEq by mouth daily. 30 tablet 3  . pravastatin (PRAVACHOL) 80 MG tablet Take 1 tablet (80 mg total) by mouth daily. 90 tablet 3  . PRESCRIPTION MEDICATION Chemo. [facility]   . Marland Kitchenrochlorperazine (COMPAZINE) 10 MG tablet Take 10 mg by mouth every 8 (eight) hours  as needed for nausea or vomiting.    . Marland Kitchenmiodarone (PACERONE) 200 MG tablet Take 1 tablet (200 mg total) by mouth daily. 60 tablet 3  . levothyroxine (SYNTHROID, LEVOTHROID) 100 MCG tablet Take 1 tablet (100 mcg total) by mouth daily before breakfast. 30 tablet 6   No facility-administered medications prior to visit.    Allergies  Allergen Reactions  . Iodinated Diagnostic Agents Rash    CT Contrast    ROS As per HPI  PE: Blood pressure 133/59, pulse 49, temperature 98.1 F (36.7 C), temperature source Oral, resp. rate 16, height 5' 6" (1.676 m), weight 188 lb 8 oz (85.503 kg), SpO2 95 %. Gen: Alert, well appearing.  Patient is oriented to person, place, time, and situation. Lower lip: abutting the vermilion border on the left side of lower lip there is a 1 cm, round, rubbery/firm nodule that has a scabby/crusty surface centrally.  No tenderness.  It does not feel like it has fluid/pus in it.  LABS:  none  IMPRESSION AND PLAN:  Lower lip mass/nodule, recent onset. Derm referral ordered today so she could get this removed and get path on it.  An After Visit Summary was printed and given to the patient.  FOLLOW UP: Return for keep appt already set for later in June.  Signed:  PhCrissie SicklesMD           06/25/2015

## 2015-06-25 NOTE — Telephone Encounter (Signed)
FYI. I spoke to [place] with [employer] and she stated that pt is due for her yearly O2 recert. She stated that Dr. Anitra Lauth only needs to document in pts note that pt is still currently using her nighttime oxygen.

## 2015-06-25 NOTE — Telephone Encounter (Signed)
Noted  

## 2015-06-29 ENCOUNTER — Ambulatory Visit (INDEPENDENT_AMBULATORY_CARE_PROVIDER_SITE_OTHER): Payer: PPO | Admitting: Pulmonary Disease

## 2015-06-29 ENCOUNTER — Encounter: Payer: Self-pay | Admitting: Pulmonary Disease

## 2015-06-29 VITALS — BP 138/70 | HR 53 | Temp 97.6°F | Ht 66.0 in | Wt 187.6 lb

## 2015-06-29 DIAGNOSIS — I5032 Chronic diastolic (congestive) heart failure: Secondary | ICD-10-CM

## 2015-06-29 DIAGNOSIS — K130 Diseases of lips: Secondary | ICD-10-CM

## 2015-06-29 DIAGNOSIS — D510 Vitamin B12 deficiency anemia due to intrinsic factor deficiency: Secondary | ICD-10-CM | POA: Diagnosis not present

## 2015-06-29 DIAGNOSIS — I480 Paroxysmal atrial fibrillation: Secondary | ICD-10-CM | POA: Diagnosis not present

## 2015-06-29 DIAGNOSIS — R942 Abnormal results of pulmonary function studies: Secondary | ICD-10-CM | POA: Diagnosis not present

## 2015-06-29 DIAGNOSIS — I10 Essential (primary) hypertension: Secondary | ICD-10-CM

## 2015-06-29 MED ORDER — CYANOCOBALAMIN 1000 MCG/ML IJ SOLN: 1000.0000 ug | INTRAMUSCULAR | Status: DC

## 2015-06-29 NOTE — Progress Notes (Signed)
Subjective:     Patient ID: Amanda Sellers, female   DOB: 11/20/1932, 82 y.o.   MRN: [id number]  HPI    82 y/o WF, referred by Amanda Sellers due to an abnormal PFT revealing a low DLCO, pt is on amiodarone for AFib...   ~  May 10, 2015:  Initial Pulmonary consultation by SN>  She is here today w/ her daughter Amanda Sellers, an RN from the ICU at [place];  Her PCP is Amanda Sellers in [place]...    82 y/o WF referred by Amanda Sellers for a decr DLCO on Amiodarone for AFib... She notes DOE w/ exertion but states that it's getting better w/ a walking regimen & feels that she is getting stronger;  She has a hx of a follicular lymphoma and had chemoRx from Amanda Sellers (his note of 03/29/15 is reviewed)- diagnosed 06/2014 & treated w/ 4 cycles of bendamustine/ rituximab w/ f/u CT showing signif improvement in adenopathy, lung nodules and renal lesions;  NOTE> she was placed on O2 for nocturnal use by Amanda Sellers during her ChemoRx, uses it prn only but doesn't want to give it up!... She saw Amanda Sellers last 01/2015 for f/u AFib & CHF- on Amiodarone since 11/2014;  NOTE> she states her breathing is much better since her successful 2nd DCCV=> NSR...  2DEcho 07/22/14:  Mild LVH, borderline LVF w/ EF=50-55%, no regional wall motion abn, Gr2DD, trivAI, calcif mitral annulus, mod LAdil at 63m, PAsys=347mg  Myoview 10/29/2014:  There is a medium defect of mild severity present in the mid anterior and apical anterior location. This is a low risk study. Breast attenuation artifact Study would not gate secondary to afib and PVCs.  TEE 10/16/2014: Normal LV function (EF 55-60%); biatrial enlargment; spontaneous contrast in LAA but no thrombus; mild AI; moderate MR (2+); mild TR.  Smoking Hx>  She is a never smoker.  Pulmonary Hx>  No prev hx of lung dis- denies hx asthma, no prev bouts of bronchitis, never had pneumonia, no hx TB or known exposure.    Cardiac Hx>  Adm 09/2014 w/ AFlutter & rvr, s/p cardioversion x2 (last 11/2014); HBP,  Chronic diastolic CHF, Valvular dis w/ modMR & mild AI  Medical Hx>  Hx non-Hodgkins Lymphoma 06/2014 treated w/ bendamustine/ rituximab by Amanda Sellers; Hx microcytic anemia & scant Fe stores c/w IDA, hx of B12 defic- prev on shots, Hypothyroid, Anxiety  Family Hx>  FamHx is NEG for any lung diseases...  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  Current Meds>  Last ChemoRx 10/2015; Amio200, Eliquis5Bid, ToprolXL25Bid, Lasix40AM&20PM, K20, Prav80, Synthroid100, Xanax0.5, Fe-Bid, VitD,   EXAM shows Afeb, VSS, O2sat=97% on RA at rest;  HEENT- neg, mallampati2;  Chest- clear w/o w/r/r;  Heart- RR Gr1/6 SEM w/o r/g;  Abd- soft, nontender, neg;  Ext- neg w/o c/c/e;  Neuro- intact...   Last CT Chest 11/17/14>  Improved thor adenopathy, modHH, norm heart size, diffuse atherosclerosis, R>L pleural effusions, diminished bilat pulm nodules (largest 52m652mIn lingula), mild subpleural reticulation & interstitial prom  Last CXR 11/25/14>  Cardiomegaly & pul vasc congestion, atherosclerotic calcif in Ao, peribronch thickening & flattened diaph w/ bibasilar atx, osteopenia and T12 compression (chronic)  PFTs 02/26/15>  FVC=2.12 (74%), FEV1=1.70 (80%), %1sec=80, mid-flows wnl at 91% predicted; TLC=5.41 (101%), RV=3.42 (136%), RV/TLC=63%; DLCO=56% predicted (DL/VA=82% predicted)  CXR today 05/10/15>  Borderline heart size, some hyperinflation noted, clear lungs, NAD...   LABS 05/10/15>  Chems-  ok x BS=110, Cr=1.13;  CBC- Hg=11.0, MCV=84;  Fe=52 (12%sat), Ferritin=37;  TSH=3.30, FreeT4=1.31;  B12=210;  Folate>23 IMP >>     Abnormal PFT w/ norm airflow, norm lung volumes, air trapping & decr DLCO=56%>  This is a baseline PFT study done 02/2015 & Amio was started 10/2014; DLCO also affected by lung vol & anemia...    CT Chest 10/2014 had some CHF w/ interstitial prominence, sm bilat effusions, but improved adenopathy & diminished pulm  nodules s/p chemotherapy for lymphoma    HBP> on MetopER25Bid, Lasix40AM&20PM, K20    Hx PAF> on Eliquis5Bid, Amiodarone200; she is holding NSR on these meds...    Chr diastolic CHF> on meds above; last BNP was 11/2014= 378...    Hyperlipidemia> on Prav80 + Fish Oil    Hypothyroid> on Synthroid100    Osteopenia and T12 compression    Hx non-hodgkins lymphoma> dx 6/16 & treated w/ 4 cycles chemoRx (last 10/28/14)    Anemia> on FeBid since 6/16 & followed by Amanda Sellers; last CBC 03/11/15 showed Hg=10.6, MCV=81; will discuss w/ Amanda Sellers re: poss IV iron therapy & she definitely needs to get back on her B12 supplementation.    Hx B12 defic> she is not currently taking B12 shots or supplements;  Vit B12 blood level 04/2015 off supplement for ?52yr 210 PLAN >>     Amanda Sellers that her DOE is improving, getting better "It helps me to walk" she notes;  We are hindered by the timing of her studies- starting on Amio 10/2014, last CTChest 10/2014, 1st PFT 02/2015, her chronic diastolic CHF, mild anemia, etc... She will continue same meds, continue to exercise, f/u w/ Oncology & we will recheck pt in 6 weeks...   ~  June 29, 2015:  6wk ROV w/ SN>  Amanda Sellers that her dyspnea is much improved overall- she denies CP, cough, sput, palpit, dizziness, edema, etc;  She says she developed a fever blister on her lower lip ~3wks ago, "couldn't get an appt w/ Derm until end of the month";  Now w/ large round lesion left side lower lip => refer to ENT NOW...   Prob list as noted above- on O2 "prn" per Amanda Sellers (she uses it occas at night), Eliquis5Bid, Amio200, Metop25Bid, Lasix40AM&20PM, K20, Prav80, Fe-Bid, VitD50K/wk, Synthroid100, Alpraz0.5prn... Her B12 level was 210 04/2015 & she is asked to start VitB12 shots Qmo again (daughter will give these to her at home)...    She had f/u AFib clinic 49/79/48 AFib, diastolicCHF; she had TEE & DCCV 09/2014, then recurrent AFib & again cardioverted 11/2014, maintaining  NSR since then- their extensive note is reviewed & she is much improved at present...    She saw Amanda Sellers 30/1/65>f/u on her follicular lymphoma, anemia (Fe de447f & hx B12 defic); felt to be in clinical remission & he continues to follow Q4m45mo    She saw DrMcGowen 06/25/15- note reviewed... IMP/PLAN>>   She is rec to continue current meds; restart B12 shots monthly per her daughter who is an RN,Therapist, sportsefer to ENT for Bx/ excision of left lower lip lesion... We plan ROV recheck in 45mo22mo   Past Medical History  Diagnosis Date  . Vitamin B 12 deficiency   . HTN (hypertension)   . Vitamin D deficiency   . HLD (hyperlipidemia)      06/2014  . Chronic diastolic congestive heart failure (HCC)[place]  Echo 07/23/14: mild LVH, EF 50-55%, grade 2 diast dysfxn  . Microcytic anemia  transfused 3 U total in hosp 07/2014  . Positive occult stool blood test 08/08/14    Endoscopies ok 08/2014  . Hypothyroidism   . Pancytopenia due to antineoplastic chemotherapy ([facility])   . Atrial fibrillation ([place])     With RVR; elec cardioversion 11/30/14  . Anticoagulation adequate, Eliquis 10/16/2014  . Chronic diastolic congestive heart failure ([place]) 1/93/7902    Complicated by atrial fibrillation  . Anxiety   . Pulmonary metastases ([place]) 07/24/2014  . Follicular lymphoma ([place])     Non Hodgkins B cell lymphoma; s/p chemo summer 2016--in remission as of 11/2014.  Marland Kitchen Olecranon bursitis of right elbow 02/2015    Past Surgical History  Procedure Laterality Date  . Breast biopsy    . Bone marrow biopsy  07/24/14  . Abdominal hysterectomy  1978  . Transthoracic echocardiogram  07/23/14    mild LVH, EF 50-55%, grade 2 diast dysfxn  . Upper gi endoscopy  09/23/2014    small hiatus hernia, nodules in stomach biopsied (chronic active erosive atrophic gastritis with intestinal metaplasia--no dysplasia or malignancy) otherwise normal  . Colonoscopy  09/23/14    small hemorrhoids, otherwise normal (performed for IDA and heme+  stool)  . Tee without cardioversion N/A 10/16/2014    Procedure: TRANSESOPHAGEAL ECHOCARDIOGRAM (TEE);  Surgeon: Lelon Perla, MD;  Location: [facility];  Service: Cardiovascular;  Laterality: N/A;  . Cardioversion N/A 10/16/2014    Procedure: CARDIOVERSION;  Surgeon: Lelon Perla, MD;  Location: [facility] ENDOSCOPY;  Service: Cardiovascular;  Laterality: N/A;  . Nm myoview ltd  10/29/2014    medium size mild surgery defect in the mid anterior and apical anterior location suggestive of breast attenuation. No reversibility. LOW RISK  . Cardioversion N/A 11/30/2014    Procedure: CARDIOVERSION;  Surgeon: Larey Dresser, MD;  Location[facility] ENDOSCOPY;  Service: Cardiovascular;  Laterality: N/A;  . Pfts  02/2015    Restriction with diffusion defect: cardiologist referred her to pulm to help interpret PFTs and decide whether she has amiodarone toxicity    Outpatient Encounter Prescriptions as of 06/29/2015  Medication Sig  . ALPRAZolam (XANAX) 0.5 MG tablet 1 tab po qhs prn anxiety-related insomnia  . amiodarone (PACERONE) 200 MG tablet Take 1 tablet (200 mg total) by mouth daily.  Marland Kitchen apixaban (ELIQUIS) 5 MG TABS tablet Take 1 tablet (5 mg total) by mouth 2 (two) times daily.  . ergocalciferol (VITAMIN D2) 50000 UNITS capsule Take 50,000 Units by mouth once a week. On Sundays  . ferrous sulfate 325 (65 FE) MG tablet Take 1 tablet (325 mg total) by mouth 2 (two) times daily with a meal.  . fish oil-omega-3 fatty acids 1000 MG capsule Take 2 g by mouth daily.  . furosemide (LASIX) 40 MG tablet Take 20m by mouth in AM and take 257mby mouth in PM  . levothyroxine (SYNTHROID, LEVOTHROID) 100 MCG tablet Take 1 tablet (100 mcg total) by mouth daily before breakfast.  . metoprolol succinate (TOPROL-XL) 25 MG 24 hr tablet Take 25 mg by mouth 2 (two) times daily.  . OXYGEN Inhale into the lungs. 1Lt at bedtime  . potassium chloride 20 MEQ TBCR Take 20 mEq by mouth daily.  . pravastatin (PRAVACHOL) 80 MG  tablet Take 1 tablet (80 mg total) by mouth daily.  . prochlorperazine (COMPAZINE) 10 MG tablet Take 10 mg by mouth every 8 (eight) hours as needed for nausea or vomiting.  . cyanocobalamin (,VITAMIN B-12,) 1000 MCG/ML injection Inject 1 mL (1,000 mcg total) into  the skin every 30 (thirty) days.  . [DISCONTINUED] PRESCRIPTION MEDICATION Chemo. Last dose was 10-02-14 at [facility].   No facility-administered encounter medications on file as of 06/29/2015.    Allergies  Allergen Reactions  . Iodinated Diagnostic Agents Rash    CT Contrast    Immunization History  Administered Date(s) Administered  . Influenza,inj,Quad PF,36+ Mos 10/27/2014  . Pneumococcal-Unspecified 01/23/2014  . Tdap 12/09/2014  . Zoster 03/19/2012    Family History  Problem Relation Age of Onset  . Melanoma Mother   . Stomach cancer Father   . Lymphoma Sister   . Diabetes Daughter     Social History   Social History  . Marital Status: Married    Spouse Name: N/A  . Number of Children: 8  . Years of Education: N/A   Occupational History  . retired    Social History Main Topics  . Smoking status: Never Smoker   . Smokeless tobacco: Never Used  . Alcohol Use: No  . Drug Use: No  . Sexual Activity: Not on file   Other Topics Concern  . Not on file   Social History Narrative   Widowed, has 8 children.   Orig from [place], now lives in [place].   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   No tob/alc/drugs.    Current Medications, Allergies, Past Medical History, Past Surgical History, Family History, and Social History were reviewed in [employer] record.   Review of Systems             All symptoms NEG except where BOLDED >>  Constitutional:  F/C/S, fatigue, anorexia, unexpected weight change. HEENT:  HA, visual changes, hearing loss, earache, nasal symptoms, sore throat, mouth sores, hoarseness. Resp:  cough, sputum, hemoptysis; SOB,  tightness, wheezing. Cardio:  CP, palpit, DOE, orthopnea, edema. GI:  N/V/D/C, blood in stool; reflux, abd pain, distention, gas. GU:  dysuria, freq, urgency, hematuria, flank pain, voiding difficulty. MS:  joint pain, swelling, tenderness, decr ROM; neck pain, back pain, etc. Neuro:  HA, tremors, seizures, dizziness, syncope, weakness, numbness, gait abn. Skin:  suspicious lesions or skin rash. Heme:  adenopathy, bruising, bleeding. Psyche:  confusion, agitation, sleep disturbance, hallucinations, anxiety, depression suicidal.   Objective:   Physical Exam       Vital Signs:  Reviewed...   General:  WD, WN, 82 y/o WF in NAD; alert & oriented; pleasant & cooperative... HEENT:  [place]/AT; Conjunctiva- pink, Sclera- nonicteric, EOM-wnl, PERRLA, EACs-clear, TMs-wnl; NOSE-clear; THROAT-clear & wnl.  Neck:  Supple w/ fair ROM; no JVD; normal carotid impulses w/o bruits; no thyromegaly or nodules palpated; no lymphadenopathy.  Chest:  Clear to P & A; without wheezes, rales, or rhonchi heard. Heart:  Regular Rhythm; Gr1/6 SEM, without rubs or gallops detected. Abdomen:  Soft & nontender- no guarding or rebound; normal bowel sounds; no organomegaly or masses palpated. Ext:  decrROM; without deformities +arthritic changes; no varicose veins, venous insuffic, or edema;  Pulses intact w/o bruits. Neuro:  CNs II-XII intact; motor testing normal; sensory testing normal; gait normal & balance OK. Derm:  No lesions noted; no rash etc. Lymph:  No cervical, supraclavicular, axillary, or inguinal adenopathy palpated.   Assessment:      IMP >>     Abnormal PFT w/ norm airflow, norm lung volumes, air trapping & decr DLCO=56%>  This is a baseline PFT study done 02/2015 & Amio was started 10/2014; DLCO also affected by lung  vol & anemia...    CT Chest 10/2014 had some CHF w/ interstitial prominence, sm bilat effusions, but improved adenopathy & diminished pulm nodules s/o chemotherapy for lymphoma    HBP> on  MetopER25Bid, Lasix40AM&20PM, K20    Hx PAF> on Eliquis5Bid, Amiodarone200; she is holding NSR on these meds...    Chr diastolic CHF> on meds above; last BNP was 11/2014= 378...    Hyperlipidemia> on Prav80 + Fish Oil    Hypothyroid> on Synthroid100    Osteopenia and T12 compression    Hx non-hodgkins lymphoma> dx 6/16 & treated w/ 4 cycles chemoRx (last 10/28/14)    Anemia> on FeBid since 6/16 & followed by Amanda Sellers; last CBC 03/11/15 showed Hg=10.6, MCV=81; will discuss w/ Amanda Sellers re: poss IV iron therapy & she definitely needs to get back on her B12 supplementation.    Hx B12 defic> she is not currently taking B12 shots or supplements & B12 level 04/2015 = 210; rec to restart her B12 shots 1010mg/mo (admit by her RN daughter)... 06/29/15> lesion on left lower lip => refer to ENT ASAP for excision...  PLAN >>   05/10/15>   SMarloweindicates that her DOE is improving, getting better "It helps me to walk" she notes;  We are indered by the timing of her studies- starting on Amio 10/2014, last CTChest 10/2014, 1st PFT 02/2015, her chronic diastolic CHF, mild anemia, etc... She will continue same meds, continue to exercise, f/u w/ Oncology & we will recheck pt in 6 weeks...  06/29/15>   She is rec to continue current meds; restart B12 shots monthly per her daughter who is an RTherapist, sports refer to ENT for Bx/ excision of left lower lip lesion.     Plan:     Patient's Medications  New Prescriptions   CYANOCOBALAMIN (,VITAMIN B-12,) 1000 MCG/ML INJECTION    Inject 1 mL (1,000 mcg total) into the skin every 30 (thirty) days.  Previous Medications   ALPRAZOLAM (XANAX) 0.5 MG TABLET    1 tab po qhs prn anxiety-related insomnia   AMIODARONE (PACERONE) 200 MG TABLET    Take 1 tablet (200 mg total) by mouth daily.   APIXABAN (ELIQUIS) 5 MG TABS TABLET    Take 1 tablet (5 mg total) by mouth 2 (two) times daily.   ERGOCALCIFEROL (VITAMIN D2) 50000 UNITS CAPSULE    Take 50,000 Units by mouth once a week. On Sundays    FERROUS SULFATE 325 (65 FE) MG TABLET    Take 1 tablet (325 mg total) by mouth 2 (two) times daily with a meal.   FISH OIL-OMEGA-3 FATTY ACIDS 1000 MG CAPSULE    Take 2 g by mouth daily.   FUROSEMIDE (LASIX) 40 MG TABLET    Take 475mby mouth in AM and take 2037my mouth in PM   LEVOTHYROXINE (SYNTHROID, LEVOTHROID) 100 MCG TABLET    Take 1 tablet (100 mcg total) by mouth daily before breakfast.   METOPROLOL SUCCINATE (TOPROL-XL) 25 MG 24 HR TABLET    Take 25 mg by mouth 2 (two) times daily.   OXYGEN    Inhale into the lungs. 1Lt at bedtime   POTASSIUM CHLORIDE 20 MEQ TBCR    Take 20 mEq by mouth daily.   PRAVASTATIN (PRAVACHOL) 80 MG TABLET    Take 1 tablet (80 mg total) by mouth daily.   PROCHLORPERAZINE (COMPAZINE) 10 MG TABLET    Take 10 mg by mouth every 8 (eight) hours as needed for nausea or vomiting.  Modified Medications   No medications on file  Discontinued Medications   PRESCRIPTION MEDICATION    Chemo. Last dose was 10-02-14 at [facility].

## 2015-06-29 NOTE — Patient Instructions (Signed)
Today we updated your med list in our EPIC system...    Continue your current medications the same...  We will arrange for an ASAP appt w/ a specialist to check the lesion on your lower lip...  Your Vit B12 level is low & you have the history of pernicious anemia...    Restart the B12 shots (31ml= 1038mcg) monthly...  Call for any questions...  Let's plan a follow up visit in 2mo, sooner if needed for problems.Marland KitchenMarland Kitchen

## 2015-07-05 DIAGNOSIS — K130 Diseases of lips: Secondary | ICD-10-CM | POA: Diagnosis not present

## 2015-07-06 ENCOUNTER — Encounter: Payer: Self-pay | Admitting: Family Medicine

## 2015-07-06 ENCOUNTER — Telehealth: Payer: Self-pay | Admitting: Cardiology

## 2015-07-06 NOTE — Telephone Encounter (Signed)
New messae      Request for surgical clearance:  What type of surgery is being performed?  Wedge excision lip mass When is this surgery scheduled?  07-16-15 Are there any medications that need to be held prior to surgery and how long? Hold eliquis for how long and is she surgically cleared? Name of physician performing surgery? Dr Melissa Montane 1. What is your office phone and fax number?  Fax [phone]

## 2015-07-09 ENCOUNTER — Ambulatory Visit (INDEPENDENT_AMBULATORY_CARE_PROVIDER_SITE_OTHER): Payer: PPO | Admitting: Family Medicine

## 2015-07-09 ENCOUNTER — Encounter: Payer: Self-pay | Admitting: Family Medicine

## 2015-07-09 VITALS — BP 152/87 | HR 53 | Temp 98.0°F | Resp 16 | Ht 66.0 in | Wt 190.5 lb

## 2015-07-09 DIAGNOSIS — I4891 Unspecified atrial fibrillation: Secondary | ICD-10-CM

## 2015-07-09 DIAGNOSIS — E039 Hypothyroidism, unspecified: Secondary | ICD-10-CM | POA: Diagnosis not present

## 2015-07-09 DIAGNOSIS — I10 Essential (primary) hypertension: Secondary | ICD-10-CM | POA: Diagnosis not present

## 2015-07-09 DIAGNOSIS — Z9981 Dependence on supplemental oxygen: Secondary | ICD-10-CM

## 2015-07-09 DIAGNOSIS — I5032 Chronic diastolic (congestive) heart failure: Secondary | ICD-10-CM | POA: Diagnosis not present

## 2015-07-09 NOTE — Progress Notes (Signed)
OFFICE VISIT  07/11/2015   CC:  Chief Complaint  Patient presents with  . Follow-up    Pt is not fasting.    HPI:    Patient is a 82 y.o. Caucasian female who presents for 4 mo f/u HTN, chronic diastolic CHF, a-fib, hypothyroidism. She gets her lip lesion removed next week.  On amio, question of amio toxicity on lungs (abnl PFTs), recently saw Dr. Lenna Gilford (pulm) and no definitive answers could be given so she is continuing all current meds and following up with him in 6 wks. He also found her vit B12 to be low, which she has hx of and had not been on her IM vit B12 lately.  Needs to restart--rx given and daughter to give these to her at home.  Next f/u with oncologist is 08/05/15---for f/u of her lymphoma (which has been in remission).  She takes her oxygen 1L every night for bedtime use and occasionally 1L for exertion during daytime. Occ home bp monitoring: normal.  Review of Systems  Constitutional: Negative for fever and fatigue.  HENT: Negative for congestion and sore throat.   Eyes: Negative for visual disturbance.  Respiratory: Negative for cough, shortness of breath and wheezing.   Cardiovascular: Negative for chest pain and palpitations.  Gastrointestinal: Negative for nausea and abdominal pain.  Genitourinary: Negative for dysuria.  Musculoskeletal: Negative for back pain and joint swelling.  Skin: Negative for rash.  Neurological: Negative for weakness and headaches.  Hematological: Negative for adenopathy.    Past Medical History  Diagnosis Date  . Vitamin B 12 deficiency   . HTN (hypertension)   . Vitamin D deficiency   . HLD (hyperlipidemia)      06/2014  . Chronic diastolic congestive heart failure ([place])     Echo 07/23/14: mild LVH, EF 50-55%, grade 2 diast dysfxn  . Microcytic anemia     transfused 3 U total in hosp 07/2014  . Positive occult stool blood test 08/08/14    Endoscopies ok 08/2014  . Hypothyroidism   . Pancytopenia due to antineoplastic  chemotherapy ([place])   . Atrial fibrillation ([place])     With RVR; elec cardioversion 11/30/14  . Anticoagulation adequate, Eliquis 10/16/2014  . Chronic diastolic congestive heart failure ([place]) 2/67/1245    Complicated by atrial fibrillation  . Anxiety   . Pulmonary metastases ([place]) 07/24/2014  . Follicular lymphoma ([place])     Non Hodgkins B cell lymphoma; s/p chemo summer 2016--in remission as of 11/2014.  Marland Kitchen Olecranon bursitis of right elbow 02/2015  . Lip lesion 06/2015    Dr. Janace Hoard to excise lower lip lesion    Past Surgical History  Procedure Laterality Date  . Breast biopsy    . Bone marrow biopsy  07/24/14  . Abdominal hysterectomy  1978  . Transthoracic echocardiogram  07/23/14    mild LVH, EF 50-55%, grade 2 diast dysfxn  . Upper gi endoscopy  09/23/2014    small hiatus hernia, nodules in stomach biopsied (chronic active erosive atrophic gastritis with intestinal metaplasia--no dysplasia or malignancy) otherwise normal  . Colonoscopy  09/23/14    small hemorrhoids, otherwise normal (performed for IDA and heme+ stool)  . Tee without cardioversion N/A 10/16/2014    Procedure: TRANSESOPHAGEAL ECHOCARDIOGRAM (TEE);  Surgeon: Lelon Perla, MD;  Location: [facility];  Service: Cardiovascular;  Laterality: N/A;  . Cardioversion N/A 10/16/2014    Procedure: CARDIOVERSION;  Surgeon: Lelon Perla, MD;  Location: [facility];  Service: Cardiovascular;  Laterality: N/A;  .  Nm myoview ltd  10/29/2014    medium size mild surgery defect in the mid anterior and apical anterior location suggestive of breast attenuation. No reversibility. LOW RISK  . Cardioversion N/A 11/30/2014    Procedure: CARDIOVERSION;  Surgeon: Larey Dresser, MD;  Location: [facility];  Service: Cardiovascular;  Laterality: N/A;  . Pfts  02/2015    Restriction with diffusion defect: cardiologist referred her to pulm to help interpret PFTs and decide whether she has amiodarone toxicity    Outpatient Prescriptions Prior  to Visit  Medication Sig Dispense Refill  . ALPRAZolam (XANAX) 0.5 MG tablet 1 tab po qhs prn anxiety-related insomnia (Patient taking differently: Take 0.5 mg by mouth at bedtime as needed for anxiety or sleep. ) 90 tablet 1  . amiodarone (PACERONE) 200 MG tablet Take 1 tablet (200 mg total) by mouth daily. 180 tablet 3  . apixaban (ELIQUIS) 5 MG TABS tablet Take 1 tablet (5 mg total) by mouth 2 (two) times daily. 180 tablet 3  . cyanocobalamin (,VITAMIN B-12,) 1000 MCG/ML injection Inject 1 mL (1,000 mcg total) into the skin every 30 (thirty) days. 1 mL 6  . ergocalciferol (VITAMIN D2) 50000 UNITS capsule Take 50,000 Units by mouth every Sunday.     . ferrous sulfate 325 (65 FE) MG tablet Take 1 tablet (325 mg total) by mouth 2 (two) times daily with a meal. 30 tablet 1  . fish oil-omega-3 fatty acids 1000 MG capsule Take 2 g by mouth daily.    . furosemide (LASIX) 40 MG tablet Take 69m by mouth in AM and take 243mby mouth in PM (Patient taking differently: Take 20-40 mg by mouth 2 (two) times daily. Take 4057my mouth in AM and take 30m77m mouth in PM) 90 tablet 3  . levothyroxine (SYNTHROID, LEVOTHROID) 100 MCG tablet Take 1 tablet (100 mcg total) by mouth daily before breakfast. 90 tablet 3  . metoprolol succinate (TOPROL-XL) 25 MG 24 hr tablet Take 25 mg by mouth 2 (two) times daily.    . OXYGEN Inhale into the lungs. 1Lt at bedtime    . potassium chloride 20 MEQ TBCR Take 20 mEq by mouth daily. 30 tablet 3  . pravastatin (PRAVACHOL) 80 MG tablet Take 1 tablet (80 mg total) by mouth daily. 90 tablet 3  . prochlorperazine (COMPAZINE) 10 MG tablet Take 10 mg by mouth every 8 (eight) hours as needed for nausea or vomiting.     No facility-administered medications prior to visit.    Allergies  Allergen Reactions  . Iodinated Diagnostic Agents Rash and Other (See Comments)    CT Contrast    ROS As per HPI  PE: Blood pressure 152/87, pulse 53, temperature 98 F (36.7 C), temperature  source Oral, resp. rate 16, height '5\' 6"'  (1.676 m), weight 190 lb 8 oz (86.41 kg), SpO2 97 %. Gen: Alert, well appearing.  Patient is oriented to person, place, time, and situation. CV: RRR Chest is clear, no wheezing or rales. Normal symmetric air entry throughout both lung fields. No chest wall deformities or tenderness. EXT: no clubbing or cyanosis.  2+ pitting edema bilat  LABS:  Lab Results  Component Value Date   IRON 52 05/10/2015   TIBC 291 07/22/2014   FERRITIN 36.5 05/10/2015    Lab Results  Component Value Date   VITAMINB12 210* 05/10/2015    Lab Results  Component Value Date   HGBA1C 4.7 09/09/2014   Lab Results  Component Value Date  TSH 3.30 05/10/2015   Lab Results  Component Value Date   WBC 5.2 05/10/2015   HGB 11.0* 05/10/2015   HCT 32.9* 05/10/2015   MCV 83.8 05/10/2015   PLT 208.0 05/10/2015   Lab Results  Component Value Date   CREATININE 1.13 05/10/2015   BUN 19 05/10/2015   NA 142 05/10/2015   K 4.8 05/10/2015   CL 103 05/10/2015   CO2 31 05/10/2015   Lab Results  Component Value Date   ALT 16 05/10/2015   AST 19 05/10/2015   ALKPHOS 62 05/10/2015   BILITOT 0.4 05/10/2015   Lab Results  Component Value Date   CHOL 150 12/09/2014   Lab Results  Component Value Date   HDL 49 12/09/2014   Lab Results  Component Value Date   LDLCALC 74 12/09/2014   Lab Results  Component Value Date   TRIG 135 12/09/2014   Lab Results  Component Value Date   CHOLHDL 3.1 12/09/2014   IMPRESSION AND PLAN:  Doing great, no changes today.  1) HTN; The current medical regimen is effective;  continue present plan and medications. Lytes/cr 2 mo ago good.  2) A-fib: stable.  Cont amiodarone, toprol, and eliquis. At this point, no definite conclusion regarding the question of amio lung toxicity.  3) Hypothyroidism: TSH 2 mo ago wnl.    4) Chronic diastolic CHF: no sign of volume overload.  Continue low Na diet and current diuretic  regimen.  5) chronic normocytic anemia: combo of anemia of malignancy, anemia secondary to antineoplastic therapy, iron def, and vit B12 deficiency.  She is restarting vit B12 therapy again.  Her iron 2 mo ago was fine. Continue current oral iron therapy.  6) Hx of vit D  Deficiency: check vit D at next f/u in 3 mo---continue 50K U q week dosing of ergocalciferol for now.  7) Chronic lung disease requiring oxygen overnight and for exertion: stable.  Continue current oxygen as outlined in HPI.  An After Visit Summary was printed and given to the patient.  FOLLOW UP: Return in about 3 months (around 10/09/2015) for routine chronic illness f/u (30 min).  Signed:  Crissie Sickles, MD           07/11/2015

## 2015-07-09 NOTE — Progress Notes (Signed)
Pre visit review using our clinic review tool, if applicable. No additional management support is needed unless otherwise documented below in the visit note. 

## 2015-07-12 ENCOUNTER — Other Ambulatory Visit: Payer: Self-pay | Admitting: Otolaryngology

## 2015-07-12 NOTE — Telephone Encounter (Signed)
Okay to hold ELIQUIS for 2 days prior to surgery. Can restart when safe postop Glenetta Hew, MD

## 2015-07-13 NOTE — Telephone Encounter (Signed)
Letter faxed.

## 2015-07-14 ENCOUNTER — Encounter (HOSPITAL_COMMUNITY): Payer: Self-pay

## 2015-07-14 ENCOUNTER — Other Ambulatory Visit (HOSPITAL_COMMUNITY): Payer: Self-pay | Admitting: *Deleted

## 2015-07-14 ENCOUNTER — Encounter (HOSPITAL_COMMUNITY)
Admission: RE | Admit: 2015-07-14 | Discharge: 2015-07-14 | Disposition: A | Discharge status: Home / Self Care | Payer: PPO | Source: Ambulatory Visit | Attending: Otolaryngology | Admitting: Otolaryngology

## 2015-07-14 DIAGNOSIS — I110 Hypertensive heart disease with heart failure: Secondary | ICD-10-CM | POA: Diagnosis not present

## 2015-07-14 DIAGNOSIS — K130 Diseases of lips: Secondary | ICD-10-CM | POA: Diagnosis not present

## 2015-07-14 DIAGNOSIS — I4891 Unspecified atrial fibrillation: Secondary | ICD-10-CM | POA: Diagnosis not present

## 2015-07-14 DIAGNOSIS — E039 Hypothyroidism, unspecified: Secondary | ICD-10-CM | POA: Diagnosis not present

## 2015-07-14 DIAGNOSIS — Z9221 Personal history of antineoplastic chemotherapy: Secondary | ICD-10-CM | POA: Diagnosis not present

## 2015-07-14 DIAGNOSIS — Z7901 Long term (current) use of anticoagulants: Secondary | ICD-10-CM | POA: Diagnosis not present

## 2015-07-14 DIAGNOSIS — J449 Chronic obstructive pulmonary disease, unspecified: Secondary | ICD-10-CM | POA: Diagnosis not present

## 2015-07-14 DIAGNOSIS — I5032 Chronic diastolic (congestive) heart failure: Secondary | ICD-10-CM | POA: Diagnosis not present

## 2015-07-14 DIAGNOSIS — Z9981 Dependence on supplemental oxygen: Secondary | ICD-10-CM | POA: Diagnosis not present

## 2015-07-14 HISTORY — DX: Family history of other specified conditions: Z84.89

## 2015-07-14 HISTORY — DX: Chronic obstructive pulmonary disease, unspecified: J44.9

## 2015-07-14 LAB — BASIC METABOLIC PANEL
Anion gap: 7 (ref 5–15)
BUN: 17 mg/dL (ref 6–20)
CO2: 31 mmol/L (ref 22–32)
Calcium: 9.1 mg/dL (ref 8.9–10.3)
Chloride: 101 mmol/L (ref 101–111)
Creatinine, Ser: 0.85 mg/dL (ref 0.44–1.00)
GFR calc Af Amer: >60 mL/min (ref >60)
GFR calc non Af Amer: >60 mL/min (ref >60)
Glucose, Bld: 115 mg/dL — ABNORMAL HIGH (ref 65–99)
Potassium: 4.2 mmol/L (ref 3.5–5.1)
Sodium: 139 mmol/L (ref 135–145)

## 2015-07-14 LAB — CBC
HCT: 33.8 % — ABNORMAL LOW (ref 36.0–46.0)
Hemoglobin: 10.5 g/dL — ABNORMAL LOW (ref 12.0–15.0)
MCH: 26.9 pg (ref 26.0–34.0)
MCHC: 31.1 g/dL (ref 30.0–36.0)
MCV: 86.7 fL (ref 78.0–100.0)
Platelets: 151 10*3/uL (ref 150–400)
RBC: 3.9 MIL/uL (ref 3.87–5.11)
RDW: 15.3 % (ref 11.5–15.5)
WBC: 4 10*3/uL (ref 4.0–10.5)

## 2015-07-14 NOTE — Progress Notes (Signed)
Pt denies any cardiac history, chest pain or sob. 

## 2015-07-14 NOTE — Pre-Procedure Instructions (Signed)
Amanda Sellers  07/14/2015      Your procedure is scheduled on Thursday, July 15, 2015 at 9:00 AM.   Report to [facility] Entrance "A" Admitting Office at 7:00 AM.   Call this number if you have problems the morning of surgery: [phone]    Remember:  Do not eat food or drink liquids after midnight tonight.  Take these medicines the morning of surgery with A SIP OF WATER: Amiodarone (Pacerone), Levothyroxine (Synthroid), Metoprolol (Toprol XL)   Do not wear jewelry, make-up or nail polish.  Do not wear lotions, powders, or perfumes.  You may wear deoderant.  Do not shave 48 hours prior to surgery.    Do not bring valuables to the hospital.  [facility] is not responsible for any belongings or valuables.  Contacts, dentures or bridgework may not be worn into surgery.  Leave your suitcase in the car.  After surgery it may be brought to your room.  For patients admitted to the hospital, discharge time will be determined by your treatment team.  Special instructions: [facility] - Preparing for Surgery  Before surgery, you can play an important role.  Because skin is not sterile, your skin needs to be as free of germs as possible.  You can reduce the number of germs on you skin by washing with CHG (chlorahexidine gluconate) soap before surgery.  CHG is an antiseptic cleaner which kills germs and bonds with the skin to continue killing germs even after washing.  Please DO NOT use if you have an allergy to CHG or antibacterial soaps.  If your skin becomes reddened/irritated stop using the CHG and inform your nurse when you arrive at Short Stay.  Do not shave (including legs and underarms) for at least 48 hours prior to the first CHG shower.  You may shave your face.  Please follow these instructions carefully:   1.  Shower with CHG Soap the night before surgery and the                                morning of Surgery.  2.  If you choose to wash your hair, wash your  hair first as usual with your       normal shampoo.  3.  After you shampoo, rinse your hair and body thoroughly to remove the                      Shampoo.  4.  Use CHG as you would any other liquid soap.  You can apply chg directly       to the skin and wash gently with scrungie or a clean washcloth.  5.  Apply the CHG Soap to your body ONLY FROM THE NECK DOWN.        Do not use on open wounds or open sores.  Avoid contact with your eyes, ears, mouth and genitals (private parts).  Wash genitals (private parts) with your normal soap.  6.  Wash thoroughly, paying special attention to the area where your surgery        will be performed.  7.  Thoroughly rinse your body with warm water from the neck down.  8.  DO NOT shower/wash with your normal soap after using and rinsing off       the CHG Soap.  9.  Pat yourself dry with a clean towel.  10.  Wear clean pajamas.            11.  Place clean sheets on your bed the night of your first shower and do not        sleep with pets.  Day of Surgery  Do not apply any lotions the morning of surgery.  Please wear clean clothes to the hospital.   Please read over the following fact sheets that you were given. Pain Booklet, Coughing and Deep Breathing and Surgical Site Infection Prevention

## 2015-07-15 ENCOUNTER — Ambulatory Visit (HOSPITAL_COMMUNITY)
Admission: RE | Admit: 2015-07-15 | Discharge: 2015-07-15 | Disposition: A | Discharge status: Home / Self Care | Payer: PPO | Source: Ambulatory Visit | Attending: Otolaryngology | Admitting: Otolaryngology

## 2015-07-15 ENCOUNTER — Ambulatory Visit (HOSPITAL_COMMUNITY): Payer: PPO | Admitting: Certified Registered Nurse Anesthetist

## 2015-07-15 ENCOUNTER — Encounter (HOSPITAL_COMMUNITY)
Admission: RE | Disposition: A | Discharge status: Home / Self Care | Payer: Self-pay | Source: Ambulatory Visit | Attending: Otolaryngology

## 2015-07-15 ENCOUNTER — Encounter (HOSPITAL_COMMUNITY): Payer: Self-pay | Admitting: *Deleted

## 2015-07-15 DIAGNOSIS — E039 Hypothyroidism, unspecified: Secondary | ICD-10-CM | POA: Diagnosis not present

## 2015-07-15 DIAGNOSIS — Z7901 Long term (current) use of anticoagulants: Secondary | ICD-10-CM | POA: Insufficient documentation

## 2015-07-15 DIAGNOSIS — Z9981 Dependence on supplemental oxygen: Secondary | ICD-10-CM | POA: Diagnosis not present

## 2015-07-15 DIAGNOSIS — Z9221 Personal history of antineoplastic chemotherapy: Secondary | ICD-10-CM | POA: Insufficient documentation

## 2015-07-15 DIAGNOSIS — I4891 Unspecified atrial fibrillation: Secondary | ICD-10-CM | POA: Diagnosis not present

## 2015-07-15 DIAGNOSIS — J449 Chronic obstructive pulmonary disease, unspecified: Secondary | ICD-10-CM | POA: Insufficient documentation

## 2015-07-15 DIAGNOSIS — C4402 Squamous cell carcinoma of skin of lip: Secondary | ICD-10-CM | POA: Diagnosis not present

## 2015-07-15 DIAGNOSIS — I5032 Chronic diastolic (congestive) heart failure: Secondary | ICD-10-CM | POA: Diagnosis not present

## 2015-07-15 DIAGNOSIS — K130 Diseases of lips: Secondary | ICD-10-CM | POA: Diagnosis not present

## 2015-07-15 DIAGNOSIS — I110 Hypertensive heart disease with heart failure: Secondary | ICD-10-CM | POA: Diagnosis not present

## 2015-07-15 HISTORY — PX: MASS EXCISION: SHX2000

## 2015-07-15 LAB — PROTIME-INR
INR: 1.07 (ref 0.00–1.49)
Prothrombin Time: 14.1 seconds (ref 11.6–15.2)

## 2015-07-15 SURGERY — EXCISION MASS
Anesthesia: General | Site: Mouth | Laterality: Left

## 2015-07-15 MED ORDER — LACTATED RINGERS IV SOLN
INTRAVENOUS | Status: DC
  Administered 2015-07-15: 08:00:00 via INTRAVENOUS

## 2015-07-15 MED ORDER — FENTANYL CITRATE (PF) 250 MCG/5ML IJ SOLN
INTRAMUSCULAR | Status: AC
  Filled 2015-07-15: qty 5

## 2015-07-15 MED ORDER — LIDOCAINE-EPINEPHRINE 1 %-1:100000 IJ SOLN
INTRAMUSCULAR | Status: DC | PRN
  Administered 2015-07-15: 2 mL via INTRADERMAL

## 2015-07-15 MED ORDER — FENTANYL CITRATE (PF) 100 MCG/2ML IJ SOLN
INTRAMUSCULAR | Status: DC | PRN
  Administered 2015-07-15 (×2): 50 ug via INTRAVENOUS

## 2015-07-15 MED ORDER — EPHEDRINE SULFATE 50 MG/ML IJ SOLN
INTRAMUSCULAR | Status: DC | PRN
  Administered 2015-07-15 (×2): 10 mg via INTRAVENOUS

## 2015-07-15 MED ORDER — CEPHALEXIN 500 MG PO CAPS: 500.0000 mg | ORAL_CAPSULE | Freq: Three times a day (TID) | ORAL | Status: DC

## 2015-07-15 MED ORDER — ROCURONIUM BROMIDE 100 MG/10ML IV SOLN
INTRAVENOUS | Status: DC | PRN
  Administered 2015-07-15: 30 mg via INTRAVENOUS

## 2015-07-15 MED ORDER — LIDOCAINE-EPINEPHRINE 1 %-1:100000 IJ SOLN
INTRAMUSCULAR | Status: AC
  Filled 2015-07-15: qty 1

## 2015-07-15 MED ORDER — BACITRACIN ZINC 500 UNIT/GM EX OINT
TOPICAL_OINTMENT | CUTANEOUS | Status: DC | PRN
  Administered 2015-07-15: 1 via TOPICAL

## 2015-07-15 MED ORDER — SUGAMMADEX SODIUM 200 MG/2ML IV SOLN
INTRAVENOUS | Status: DC | PRN
  Administered 2015-07-15: 200 mg via INTRAVENOUS

## 2015-07-15 MED ORDER — 0.9 % SODIUM CHLORIDE (POUR BTL) OPTIME
TOPICAL | Status: DC | PRN
  Administered 2015-07-15: 1000 mL

## 2015-07-15 MED ORDER — ONDANSETRON HCL 4 MG/2ML IJ SOLN
INTRAMUSCULAR | Status: DC | PRN
  Administered 2015-07-15: 4 mg via INTRAVENOUS

## 2015-07-15 MED ORDER — BACITRACIN ZINC 500 UNIT/GM EX OINT
TOPICAL_OINTMENT | CUTANEOUS | Status: AC
  Filled 2015-07-15: qty 28.35

## 2015-07-15 MED ORDER — PROPOFOL 10 MG/ML IV BOLUS
INTRAVENOUS | Status: DC | PRN
  Administered 2015-07-15: 100 mg via INTRAVENOUS

## 2015-07-15 MED ORDER — LIDOCAINE HCL (CARDIAC) 20 MG/ML IV SOLN
INTRAVENOUS | Status: DC | PRN
  Administered 2015-07-15: 60 mg via INTRAVENOUS

## 2015-07-15 SURGICAL SUPPLY — 57 items
ADH SKN CLS APL DERMABOND .7 (GAUZE/BANDAGES/DRESSINGS) ×1
AIRSTRIP 4 3/4X3 1/4 7185 (GAUZE/BANDAGES/DRESSINGS) IMPLANT
ATTRACTOMAT 16X20 MAGNETIC DRP (DRAPES) IMPLANT
BLADE 10 SAFETY STRL DISP (BLADE) ×2 IMPLANT
BLADE SURG 15 STRL LF DISP TIS (BLADE) IMPLANT
BLADE SURG 15 STRL SS (BLADE) ×2
BNDG CONFORM 2 STRL LF (GAUZE/BANDAGES/DRESSINGS) IMPLANT
BNDG GAUZE ELAST 4 BULKY (GAUZE/BANDAGES/DRESSINGS) IMPLANT
CANISTER SUCTION 2500CC (MISCELLANEOUS) ×1 IMPLANT
CATH ROBINSON RED A/P 16FR (CATHETERS) IMPLANT
CLEANER TIP ELECTROSURG 2X2 (MISCELLANEOUS) ×2 IMPLANT
CONT SPEC 4OZ CLIKSEAL STRL BL (MISCELLANEOUS) ×2 IMPLANT
COVER SURGICAL LIGHT HANDLE (MISCELLANEOUS) ×2 IMPLANT
CRADLE DONUT ADULT HEAD (MISCELLANEOUS) IMPLANT
DERMABOND ADVANCED (GAUZE/BANDAGES/DRESSINGS) ×1
DERMABOND ADVANCED .7 DNX12 (GAUZE/BANDAGES/DRESSINGS) ×1 IMPLANT
DRAIN PENROSE 1/4X12 LTX STRL (WOUND CARE) IMPLANT
DRAIN SNY 10 ROU (WOUND CARE) IMPLANT
DRAIN SNY 7 FPER (WOUND CARE) IMPLANT
DRAPE PROXIMA HALF (DRAPES) IMPLANT
DRSG EMULSION OIL 3X3 NADH (GAUZE/BANDAGES/DRESSINGS) IMPLANT
ELECT COATED BLADE 2.86 ST (ELECTRODE) ×2 IMPLANT
ELECT NDL TIP 2.8 STRL (NEEDLE) IMPLANT
ELECT NEEDLE TIP 2.8 STRL (NEEDLE) IMPLANT
ELECT REM PT RETURN 9FT ADLT (ELECTROSURGICAL) ×2
ELECTRODE REM PT RTRN 9FT ADLT (ELECTROSURGICAL) ×1 IMPLANT
GAUZE SPONGE 4X4 12PLY STRL (GAUZE/BANDAGES/DRESSINGS) IMPLANT
GAUZE SPONGE 4X4 16PLY XRAY LF (GAUZE/BANDAGES/DRESSINGS) IMPLANT
GLOVE ECLIPSE 7.5 STRL STRAW (GLOVE) ×2 IMPLANT
GOWN STRL REUS W/ TWL LRG LVL3 (GOWN DISPOSABLE) ×2 IMPLANT
GOWN STRL REUS W/ TWL XL LVL3 (GOWN DISPOSABLE) ×1 IMPLANT
GOWN STRL REUS W/TWL LRG LVL3 (GOWN DISPOSABLE) ×4
GOWN STRL REUS W/TWL XL LVL3 (GOWN DISPOSABLE) ×2
KIT BASIN OR (CUSTOM PROCEDURE TRAY) ×2 IMPLANT
KIT ROOM TURNOVER OR (KITS) ×2 IMPLANT
NDL 25GX 5/8IN NON SAFETY (NEEDLE) IMPLANT
NDL HYPO 25GX1X1/2 BEV (NEEDLE) IMPLANT
NEEDLE 25GX 5/8IN NON SAFETY (NEEDLE) ×2 IMPLANT
NEEDLE HYPO 25GX1X1/2 BEV (NEEDLE) IMPLANT
NS IRRIG 1000ML POUR BTL (IV SOLUTION) ×2 IMPLANT
PAD ARMBOARD 7.5X6 YLW CONV (MISCELLANEOUS) ×4 IMPLANT
PENCIL FOOT CONTROL (ELECTRODE) ×2 IMPLANT
POUCH STERILIZING 3 X22 (STERILIZATION PRODUCTS) IMPLANT
SUT CHROMIC 4 0 P 3 18 (SUTURE) ×2 IMPLANT
SUT ETHILON 4 0 PS 2 18 (SUTURE) ×2 IMPLANT
SUT ETHILON 5 0 P 3 18 (SUTURE) ×1
SUT NYLON ETHILON 5-0 P-3 1X18 (SUTURE) ×1 IMPLANT
SUT SILK 4 0 (SUTURE) ×2
SUT SILK 4-0 18XBRD TIE 12 (SUTURE) ×1 IMPLANT
SWAB COLLECTION DEVICE MRSA (MISCELLANEOUS) IMPLANT
SYR BULB IRRIGATION 50ML (SYRINGE) IMPLANT
SYR TB 1ML LUER SLIP (SYRINGE) IMPLANT
TOWEL OR 17X24 6PK STRL BLUE (TOWEL DISPOSABLE) ×2 IMPLANT
TRAY ENT MC OR (CUSTOM PROCEDURE TRAY) ×2 IMPLANT
TUBE ANAEROBIC SPECIMEN COL (MISCELLANEOUS) IMPLANT
WATER STERILE IRR 1000ML POUR (IV SOLUTION) ×1 IMPLANT
YANKAUER SUCT BULB TIP NO VENT (SUCTIONS) ×1 IMPLANT

## 2015-07-15 NOTE — H&P (Addendum)
Amanda Sellers is an 82 y.o. female.   Chief Complaint: lip lesion HPI: hx of mass on the lower lip that is a enlarging rapidly. Now needs a resection.the pathology is uncertain.   Past Medical History  Diagnosis Date  . Vitamin B 12 deficiency   . HTN (hypertension)   . Vitamin D deficiency   . HLD (hyperlipidemia)      06/2014  . Chronic diastolic congestive heart failure ([place])     Echo 07/23/14: mild LVH, EF 50-55%, grade 2 diast dysfxn  . Microcytic anemia     transfused 3 U total in hosp 07/2014  . Positive occult stool blood test 08/08/14    Endoscopies ok 08/2014  . Hypothyroidism   . Pancytopenia due to antineoplastic chemotherapy ([place])   . Atrial fibrillation ([place])     With RVR; elec cardioversion 11/30/14  . Anticoagulation adequate, Eliquis 10/16/2014  . Chronic diastolic congestive heart failure ([place]) 8/83/2549    Complicated by atrial fibrillation  . Pulmonary metastases ([place]) 07/24/2014  . Follicular lymphoma ([place])     Non Hodgkins B cell lymphoma; s/p chemo summer 2016--in remission as of 11/2014.  Marland Kitchen Olecranon bursitis of right elbow 02/2015  . Lip lesion 06/2015    Dr. Janace Hoard to excise lower lip lesion  . Dysrhythmia     A-fib  . Shortness of breath dyspnea     with exertion  . COPD (chronic obstructive pulmonary disease) ([place])     recently noted on Xray, does not have any problems  . Anxiety     pt denies  . Family history of adverse reaction to anesthesia     daughter has difficulty waking up    Past Surgical History  Procedure Laterality Date  . Breast biopsy    . Bone marrow biopsy  07/24/14  . Abdominal hysterectomy  1978  . Transthoracic echocardiogram  07/23/14    mild LVH, EF 50-55%, grade 2 diast dysfxn  . Upper gi endoscopy  09/23/2014    small hiatus hernia, nodules in stomach biopsied (chronic active erosive atrophic gastritis with intestinal metaplasia--no dysplasia or malignancy) otherwise normal  . Colonoscopy  09/23/14    small hemorrhoids,  otherwise normal (performed for IDA and heme+ stool)  . Tee without cardioversion N/A 10/16/2014    Procedure: TRANSESOPHAGEAL ECHOCARDIOGRAM (TEE);  Surgeon: Lelon Perla, MD;  Location: [place];  Service: Cardiovascular;  Laterality: N/A;  . Cardioversion N/A 10/16/2014    Procedure: CARDIOVERSION;  Surgeon: Lelon Perla, MD;  Location: [facility] ENDOSCOPY;  Service: Cardiovascular;  Laterality: N/A;  . Nm myoview ltd  10/29/2014    medium size mild surgery defect in the mid anterior and apical anterior location suggestive of breast attenuation. No reversibility. LOW RISK  . Cardioversion N/A 11/30/2014    Procedure: CARDIOVERSION;  Surgeon: Larey Dresser, MD;  Location[facility];  Service: Cardiovascular;  Laterality: N/A;  . Pfts  02/2015    Restriction with diffusion defect: cardiologist referred her to pulm to help interpret PFTs and decide whether she has amiodarone toxicity  . Colonoscopy      Family History  Problem Relation Age of Onset  . Melanoma Mother   . Stomach cancer Father   . Lymphoma Sister   . Diabetes Daughter    Social History:  reports that she has never smoked. She has never used smokeless tobacco. She reports that she does not drink alcohol or use illicit drugs.  Allergies:  Allergies  Allergen Reactions  .  Iodinated Diagnostic Agents Rash and Other (See Comments)    CT Contrast    Medications Prior to Admission  Medication Sig Dispense Refill  . ALPRAZolam (XANAX) 0.5 MG tablet 1 tab po qhs prn anxiety-related insomnia (Patient taking differently: Take 0.5 mg by mouth at bedtime as needed for anxiety or sleep. ) 90 tablet 1  . amiodarone (PACERONE) 200 MG tablet Take 1 tablet (200 mg total) by mouth daily. 180 tablet 3  . apixaban (ELIQUIS) 5 MG TABS tablet Take 1 tablet (5 mg total) by mouth 2 (two) times daily. 180 tablet 3  . cyanocobalamin (,VITAMIN B-12,) 1000 MCG/ML injection Inject 1 mL (1,000 mcg total) into the skin every 30 (thirty)  days. 1 mL 6  . ergocalciferol (VITAMIN D2) 50000 UNITS capsule Take 50,000 Units by mouth every Sunday.     . ferrous sulfate 325 (65 FE) MG tablet Take 1 tablet (325 mg total) by mouth 2 (two) times daily with a meal. 30 tablet 1  . fish oil-omega-3 fatty acids 1000 MG capsule Take 2 g by mouth daily.    . furosemide (LASIX) 40 MG tablet Take 23m by mouth in AM and take 254mby mouth in PM (Patient taking differently: Take 20-40 mg by mouth 2 (two) times daily. Take 4011my mouth in AM and take 33m68m mouth in PM) 90 tablet 3  . levothyroxine (SYNTHROID, LEVOTHROID) 100 MCG tablet Take 1 tablet (100 mcg total) by mouth daily before breakfast. 90 tablet 3  . metoprolol succinate (TOPROL-XL) 25 MG 24 hr tablet Take 25 mg by mouth 2 (two) times daily.    . OXYGEN Inhale into the lungs. 1Lt at bedtime    . potassium chloride 20 MEQ TBCR Take 20 mEq by mouth daily. 30 tablet 3  . pravastatin (PRAVACHOL) 80 MG tablet Take 1 tablet (80 mg total) by mouth daily. 90 tablet 3  . prochlorperazine (COMPAZINE) 10 MG tablet Take 10 mg by mouth every 8 (eight) hours as needed for nausea or vomiting.      Results for orders placed or performed during the hospital encounter of 07/14/15 (from the past 48 hour(s))  Basic metabolic panel     Status: Abnormal   Collection Time: 07/14/15  9:56 AM  Result Value Ref Range   Sodium 139 135 - 145 mmol/L   Potassium 4.2 3.5 - 5.1 mmol/L   Chloride 101 101 - 111 mmol/L   CO2 31 22 - 32 mmol/L   Glucose, Bld 115 (H) 65 - 99 mg/dL   BUN 17 6 - 20 mg/dL   Creatinine, Ser 0.85 0.44 - 1.00 mg/dL   Calcium 9.1 8.9 - 10.3 mg/dL   GFR calc non Af Amer >60 >60 mL/min   GFR calc Af Amer >60 >60 mL/min    Comment: (NOTE) The eGFR has been calculated using the CKD EPI equation. This calculation has not been validated in all clinical situations. eGFR's persistently <60 mL/min signify possible Chronic Kidney Disease.    Anion gap 7 5 - 15  CBC     Status: Abnormal    Collection Time: 07/14/15  9:56 AM  Result Value Ref Range   WBC 4.0 4.0 - 10.5 K/uL   RBC 3.90 3.87 - 5.11 MIL/uL   Hemoglobin 10.5 (L) 12.0 - 15.0 g/dL   HCT 33.8 (L) 36.0 - 46.0 %   MCV 86.7 78.0 - 100.0 fL   MCH 26.9 26.0 - 34.0 pg   MCHC 31.1 30.0 -  36.0 g/dL   RDW 15.3 11.5 - 15.5 %   Platelets 151 150 - 400 K/uL   No results found.  Review of Systems  Constitutional: Negative.   HENT: Negative.   Eyes: Negative.   Respiratory: Negative.   Cardiovascular: Negative.   Skin: Negative.     Blood pressure 165/43, pulse 60, temperature 98.7 F (37.1 C), resp. rate 20, height '5\' 6"'  (1.676 m), weight 84.823 kg (187 lb), SpO2 96 %. Physical Exam   Assessment/Plan Lower lip lesion- we discussed the resection and ready to proceed,  Melissa Montane, MD 07/15/2015, 7:28 AM

## 2015-07-15 NOTE — Anesthesia Preprocedure Evaluation (Addendum)
Anesthesia Evaluation  Patient identified by MRN, date of birth, ID band Patient awake    Reviewed: Allergy & Precautions, NPO status , Patient's Chart, lab work & pertinent test results  Airway Mallampati: II  TM Distance: >3 FB Neck ROM: Full    Dental  (+) Edentulous Upper, Edentulous Lower   Pulmonary shortness of breath, COPD,  oxygen dependent,    breath sounds clear to auscultation       Cardiovascular hypertension, Pt. on medications +CHF  + dysrhythmias Atrial Fibrillation  Rhythm:Regular Rate:Normal  09/2014 TEE: Normal LV function; biatrial enlargment; spontaneous contrast in LAA but no thrombus; mild AI; moderate MR (2+); mild TR.  10/2014 Myoview. Low risk study.   Neuro/Psych Anxiety negative neurological ROS     GI/Hepatic negative GI ROS, Neg liver ROS,   Endo/Other  Hypothyroidism   Renal/GU negative Renal ROS     Musculoskeletal   Abdominal   Peds  Hematology  (+) anemia ,   Anesthesia Other Findings   Reproductive/Obstetrics                            Lab Results  Component Value Date   WBC 4.0 07/14/2015   HGB 10.5* 07/14/2015   HCT 33.8* 07/14/2015   MCV 86.7 07/14/2015   PLT 151 07/14/2015   Lab Results  Component Value Date   CREATININE 0.85 07/14/2015   BUN 17 07/14/2015   NA 139 07/14/2015   K 4.2 07/14/2015   CL 101 07/14/2015   CO2 31 07/14/2015    Anesthesia Physical Anesthesia Plan  ASA: III  Anesthesia Plan: General   Post-op Pain Management:    Induction: Intravenous  Airway Management Planned: LMA and Oral ETT  Additional Equipment:   Intra-op Plan:   Post-operative Plan: Extubation in OR  Informed Consent: I have reviewed the patients History and Physical, chart, labs and discussed the procedure including the risks, benefits and alternatives for the proposed anesthesia with the patient or authorized representative who has  indicated his/her understanding and acceptance.   Dental advisory given  Plan Discussed with: CRNA  Anesthesia Plan Comments:         Anesthesia Quick Evaluation

## 2015-07-15 NOTE — Transfer of Care (Signed)
Immediate Anesthesia Transfer of Care Note  Patient: Amanda Sellers  Procedure(s) Performed: Procedure(s) with comments: EXCISION MASS (Left) - Wedge excision left lower lip mass  Patient Location: PACU  Anesthesia Type:General  Level of Consciousness: awake and alert   Airway & Oxygen Therapy: Patient Spontanous Breathing and Patient connected to nasal cannula oxygen  Post-op Assessment: Report given to RN and Post -op Vital signs reviewed and stable  Post vital signs: Reviewed and stable  Last Vitals:  Filed Vitals:   07/15/15 0718 07/15/15 1019  BP: 165/43 160/62  Pulse: 60 69  Temp: 37.1 C 36.6 C  Resp: 20 17    Last Pain: There were no vitals filed for this visit.    Patients Stated Pain Goal: 3 ([id number])  Complications: No apparent anesthesia complications

## 2015-07-15 NOTE — Op Note (Signed)
Preop/postop diagnosis: Left lower lip lesion Procedure: Wedge resection of left lower lip Anesthesia: Gen. Estimated blood loss: Less than 5 mL Indications: 82 year old who's had a  Mass on the left lower lip that has come up rapidly. It has according to the patient's history only been there approximately a month to 6 weeks.the family and patient were informed of  risk and benefits of the procedure and options were discussed all questions are answered and consent was obtained. Procedure: Patient was taken to the operating room placed in the supine position after general endotracheal tube anesthesia was prepped and draped in the usual sterile manner. The lesion was outlined with a marking pen marking the vermilion border on each side of the very large mass. The wedge portion of the advancement flap was outlined as well. Using the electrocautery the dissection was carried out around the mass down into the wedge . The dissection was carried out through the mucosa and the lesion was marked with suture. It was sent for permanent section. Along the lateral right and left edges the additional resection was made of a piece of the entire lip and sent for frozen section. Both sections were benign. The wound was then  obtained hemostasis and the vermilion border that had been marked was brought together with a 4-0 nylon.the mucosa was closed with interrupted 4-0 chromic. The subcutaneous closed with interrupted 4-0 chromic. The skin was closed with interrupted 4-0 nylon.  The lip was nicely lined up and bacitracin was placed. Patient was then awakened brought to recovery room in stable condition counts correct

## 2015-07-15 NOTE — Discharge Instructions (Addendum)
Call if any problems. Follow up in 1 week. Soft diet would be best. Apply antibiotic cream to the wound outside on the skin twice per day

## 2015-07-15 NOTE — Anesthesia Procedure Notes (Signed)
Procedure Name: Intubation Date/Time: 07/15/2015 9:06 AM Performed by: [place] Pink Pre-anesthesia Checklist: Patient identified, Emergency Drugs available, Suction available, Patient being monitored and Timeout performed Patient Re-evaluated:Patient Re-evaluated prior to inductionOxygen Delivery Method: Circle system utilized Preoxygenation: Pre-oxygenation with 100% oxygen Intubation Type: IV induction Ventilation: Mask ventilation without difficulty and Oral airway inserted - appropriate to patient size Laryngoscope Size: Mac and 4 Grade View: Grade I Tube type: Oral Tube size: 7.5 mm Number of attempts: 1 Airway Equipment and Method: Stylet Placement Confirmation: ETT inserted through vocal cords under direct vision,  positive ETCO2 and breath sounds checked- equal and bilateral Secured at: 20 cm Tube secured with: Tape Dental Injury: Teeth and Oropharynx as per pre-operative assessment

## 2015-07-16 ENCOUNTER — Encounter (HOSPITAL_COMMUNITY): Payer: Self-pay | Admitting: Otolaryngology

## 2015-07-19 NOTE — Anesthesia Postprocedure Evaluation (Signed)
Anesthesia Post Note  Patient: Breanah Alexandra Beyene  Procedure(s) Performed: Procedure(s) (LRB): EXCISION MASS (Left)  Patient location during evaluation: PACU Anesthesia Type: General Level of consciousness: awake and alert Pain management: pain level controlled Vital Signs Assessment: post-procedure vital signs reviewed and stable Respiratory status: spontaneous breathing, nonlabored ventilation, respiratory function stable and patient connected to nasal cannula oxygen Cardiovascular status: blood pressure returned to baseline and stable Postop Assessment: no signs of nausea or vomiting Anesthetic complications: no    Last Vitals:  Filed Vitals:   07/15/15 1215 07/15/15 1233  BP: 155/53 151/82  Pulse: 69 97  Temp: 36.6 C   Resp: 18 16    Last Pain:  Filed Vitals:   07/15/15 1234  PainSc: 0-No pain                 Tiajuana Amass

## 2015-08-05 ENCOUNTER — Other Ambulatory Visit (HOSPITAL_BASED_OUTPATIENT_CLINIC_OR_DEPARTMENT_OTHER): Payer: PPO

## 2015-08-05 ENCOUNTER — Ambulatory Visit (HOSPITAL_BASED_OUTPATIENT_CLINIC_OR_DEPARTMENT_OTHER): Payer: PPO | Admitting: Oncology

## 2015-08-05 ENCOUNTER — Telehealth: Payer: Self-pay | Admitting: Oncology

## 2015-08-05 VITALS — BP 140/62 | HR 69 | Temp 98.2°F | Resp 18 | Ht 66.0 in | Wt 188.7 lb

## 2015-08-05 DIAGNOSIS — D649 Anemia, unspecified: Secondary | ICD-10-CM | POA: Diagnosis not present

## 2015-08-05 DIAGNOSIS — C8290 Follicular lymphoma, unspecified, unspecified site: Secondary | ICD-10-CM | POA: Diagnosis not present

## 2015-08-05 DIAGNOSIS — C8209 Follicular lymphoma grade I, extranodal and solid organ sites: Secondary | ICD-10-CM

## 2015-08-05 DIAGNOSIS — C8200 Follicular lymphoma grade I, unspecified site: Secondary | ICD-10-CM

## 2015-08-05 DIAGNOSIS — C7800 Secondary malignant neoplasm of unspecified lung: Secondary | ICD-10-CM

## 2015-08-05 LAB — CBC WITH DIFFERENTIAL/PLATELET
BASO%: 0 % (ref 0.0–2.0)
Basophils Absolute: 0 10*3/uL (ref 0.0–0.1)
EOS%: 2.5 % (ref 0.0–7.0)
Eosinophils Absolute: 0.1 10*3/uL (ref 0.0–0.5)
HCT: 33.4 % — ABNORMAL LOW (ref 34.8–46.6)
HGB: 10.9 g/dL — ABNORMAL LOW (ref 11.6–15.9)
LYMPH%: 16.7 % (ref 14.0–49.7)
MCH: 28.3 pg (ref 25.1–34.0)
MCHC: 32.6 g/dL (ref 31.5–36.0)
MCV: 86.8 fL (ref 79.5–101.0)
MONO#: 0.4 10*3/uL (ref 0.1–0.9)
MONO%: 10.9 % (ref 0.0–14.0)
NEUT#: 2.5 10*3/uL (ref 1.5–6.5)
NEUT%: 69.9 % (ref 38.4–76.8)
Platelets: 160 10*3/uL (ref 145–400)
RBC: 3.85 10*6/uL (ref 3.70–5.45)
RDW: 15 % — ABNORMAL HIGH (ref 11.2–14.5)
WBC: 3.6 10*3/uL — ABNORMAL LOW (ref 3.9–10.3)
lymph#: 0.6 10*3/uL — ABNORMAL LOW (ref 0.9–3.3)

## 2015-08-05 LAB — FERRITIN: Ferritin: 38 ng/ml (ref 9–269)

## 2015-08-05 LAB — IRON AND TIBC
%SAT: 14 % — ABNORMAL LOW (ref 21–57)
Iron: 52 ug/dL (ref 41–142)
TIBC: 374 ug/dL (ref 236–444)
UIBC: 322 ug/dL (ref 120–384)

## 2015-08-05 LAB — CHCC SMEAR

## 2015-08-05 NOTE — Progress Notes (Signed)
  [facility] OFFICE PROGRESS NOTE   Diagnosis: Non-Hodgkin's lymphoma  INTERVAL HISTORY:   His Hucker returns as scheduled. She has a good appetite and energy level. No fever or night sweats. No palpable lymph nodes. She developed a mass on the left lower lip and underwent resection by Dr. Janace Hoard on 07/15/2015. The pathology revealed invasive squamous cell carcinoma with negative resection margins. She recently resumed vitamin B 12 therapy.  Objective:  Vital signs in last 24 hours:  Blood pressure 140/62, pulse 69, temperature 98.2 F (36.8 C), temperature source Oral, resp. rate 18, height '5\' 6"'$  (1.676 m), weight 188 lb 11.2 oz (85.594 kg), SpO2 100 %.    HEENT: Healing eschar at the left lower lip Lymphatics: No cervical, supraclavicular, axillary, or inguinal nodes Resp: Lungs clear bilaterally Cardio: Regular rate and rhythm GI: No hepatosplenomegaly, no mass, nontender Vascular: No leg edema   Lab Results:  Lab Results  Component Value Date   WBC 3.6* 08/05/2015   HGB 10.9* 08/05/2015   HCT 33.4* 08/05/2015   MCV 86.8 08/05/2015   PLT 160 08/05/2015   NEUTROABS 2.5 08/05/2015     Medications: I have reviewed the patient's current medications.  Assessment/Plan: 1. Non-Hodgkin's Lymphoma-bone marrow biopsy 07/24/2014 consistent with involvement by follicular B-cell lymphoma, CD20 positive   CTs of the chest 07/22/2014 and CT of the abdomen and pelvis on 07/23/2014-pulmonary nodules, hilar/mediastinal/supraclavicular/axillary adenopathy, splenomegaly, abdominal adenopathy, bilateral adrenal nodules   Cycle 1 bendamustine/rituximab 08/04/2014  Cycle 2 bendamustine/Rituxan 09/01/2014  Cycle 3 bendamustine/Rituxan 09/30/2014  Cycle 4 bendamustine/rituximab 10/27/2014  Restaging CT scans 11/17/2014 with significant improvement in diffuse lymphadenopathy, lung nodules, and renal lesions 2. Severe microcytic anemia, status post transfusion with  packed red blood cells 07/23/2014   Normal ferritin, low transferrin saturation, "scant" bone marrow iron stores, review of peripheral blood smear consistent with iron deficiency anemia   colonoscopy and upper endoscopy 09/23/2014 3. Exertional dyspnea /orthopnea-most likely secondary to congestive heart failure  4. History of a B-12 deficiency  5. Rash over the trunk and extremities 08/18/2014. Appears consistent with a drug rash. Resolved 08/28/2014.  Rash 11/18/2014, likely secondary to a contrast allergy , diagnostic contrast will be listed as an allergy 6. Colonoscopy 06/03/2008. Medium sized internal hemorrhoids.  7. Admission with atrial flutter with a rapid ventricular response 10/15/2014-status post cardioversion 10/16/2014, maintained on  apixaban  Repeat cardioversion 11/30/2014  8. Right olecranon skin lesion-likely a lipoma or cyst 9. 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 Hartley remains in clinical remission from non-Hodgkin's lymphoma. She will return for an office visit in 4 months. She has persistent mild anemia, potentially related to chemotherapy.  [name], MD  08/05/2015  10:49 AM

## 2015-08-05 NOTE — Telephone Encounter (Signed)
Gave patient avs report and appointments for November.  °

## 2015-08-09 ENCOUNTER — Ambulatory Visit (INDEPENDENT_AMBULATORY_CARE_PROVIDER_SITE_OTHER): Payer: PPO | Admitting: Cardiology

## 2015-08-09 ENCOUNTER — Encounter: Payer: Self-pay | Admitting: Cardiology

## 2015-08-09 VITALS — BP 159/69 | HR 62 | Ht 66.0 in | Wt 190.6 lb

## 2015-08-09 DIAGNOSIS — I5032 Chronic diastolic (congestive) heart failure: Secondary | ICD-10-CM

## 2015-08-09 DIAGNOSIS — I10 Essential (primary) hypertension: Secondary | ICD-10-CM

## 2015-08-09 DIAGNOSIS — I480 Paroxysmal atrial fibrillation: Secondary | ICD-10-CM | POA: Diagnosis not present

## 2015-08-09 DIAGNOSIS — Z79899 Other long term (current) drug therapy: Secondary | ICD-10-CM | POA: Diagnosis not present

## 2015-08-09 MED ORDER — FUROSEMIDE 40 MG PO TABS: 40.0000 mg | ORAL_TABLET | Freq: Two times a day (BID) | ORAL | Status: DC

## 2015-08-09 NOTE — Patient Instructions (Signed)
Your physician wants you to follow-up in: 6 months or or sooner if needed. You will receive a reminder letter in the mail two months in advance. If you don't receive a letter, please call our office to schedule the follow-up appointment.   If you need a refill on your cardiac medications before your next appointment, please call your pharmacy.

## 2015-08-09 NOTE — Progress Notes (Signed)
PCP: Tammi Sou, MD  Clinic Note: Chief Complaint  Patient presents with  . Atrial Fibrillation  . Congestive Heart Failure    Chronic diastolic heart failure    HPI: Amanda Sellers is a 82 y.o. female with a PMH below who presents today for six-month follow-up of PAF and mild diastolic heart failure. Was question of some potential amiodarone toxicity. Dyspnea on exertion was improving. Meds are continued.  Her A. Fib as in initially diagnosed in the setting of undergoing treatment for lymphoma -- this was initially in September. She was treated with IV diltiazem and diuresis for acute on chronic combined heart failure. She had TEE cardioversion. She was discharged on full anticoagulation.  After showing recurrence of A. fib, she was started on amiodarone -- She has finished chemotherapy for non-Hodgkin's lymphoma with follow-up PET scan in October. It would appear that her cancer is in remission. -- Her final Lasix dosing was 40 mg in the a.m. and 20 in the p.m.  Studies Reviewed: No new studies  Amanda Sellers was last seen in January 2017. She was doing relatively well. No more dyspnea or edema. Was on stable dose of 40 mg Lasix in the morning and 20 in the evening. No sense of recurrent A. fib. Was putting back on some of the weight from her cancer related weight loss. Prior to that, she was seen on Dec 13 after DCCV in November.  -- She was noted to be in persistent A. Fib --> now that she was loaded on amiodarone, and on standing dose of DOAC (apixaban).  Because of increased dyspnea, she was placed on increased dose of Lasix to 80 mg in the mornings for couple days. She diuresed well with the higher dose of Lasix.  She was then referred back for cardioversion while on amiodarone.  Recent Hospitalizations: Outpatient cardioversion on November 7 He then returned to [facility] following cardioversion and felt much better. More energy and breathing improved. On December  13, she continued to do well. Back to routine activities.  Seen in A. fib clinic in April. Doing well. Continue amiodarone 200 mg qd Continue metoprolol 25 mg bid  Continue apixaban for chadsvasc score of at least 4, has not missed any doses  S/p resection of SSCa L lower lip 07/15/15  Interval History: Amanda Sellers presents today feeling well without any major complaints. He really has not had any irregular heartbeats palpitations to suggest recurrence of A. fib. Still has some edema and little bit of exertional dyspnea. Otherwise really has been doing very well. 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 His energy level has improved the farther out she gets her chemotherapy.  No chest pain or shortness of breath with rest or exertion. Occasional PND.  2 pillows to sleep.  No edema. No palpitations, lightheadedness, dizziness, weakness or syncope/near syncope. No TIA/amaurosis fugax symptoms. No melena, hematochezia, hematuria, or epstaxis - while on DOAC No claudication.  ROS: A comprehensive was performed. Review of Systems  Constitutional: Negative for malaise/fatigue (notably improved energy).  Eyes: Negative for blurred vision.  Respiratory: Negative for hemoptysis and shortness of breath.   Gastrointestinal: Negative for heartburn, blood in stool and melena.  Genitourinary: Negative for hematuria.  Musculoskeletal: Negative for myalgias, joint pain and falls.  Neurological: Negative for loss of consciousness and headaches.  Endo/Heme/Allergies: Bruises/bleeds easily.  Psychiatric/Behavioral: Negative for depression. The patient has insomnia (does not sleep well _0 .  sleeps 3-4  hrs a night. up & down. ).   All other systems reviewed and are negative.   Past Medical History  Diagnosis Date  . Vitamin B 12 deficiency   . HTN (hypertension)   . Vitamin D deficiency   . HLD (hyperlipidemia)      06/2014  . Chronic diastolic congestive heart  failure ([place])     Echo 07/23/14: mild LVH, EF 50-55%, grade 2 diast dysfxn  . Microcytic anemia     transfused 3 U total in hosp 07/2014  . Positive occult stool blood test 08/08/14    Endoscopies ok 08/2014  . Hypothyroidism   . Pancytopenia due to antineoplastic chemotherapy ([place])   . Atrial fibrillation ([place])     With RVR; elec cardioversion 11/30/14  . Anticoagulation adequate, Eliquis 10/16/2014  . Chronic diastolic congestive heart failure ([place]) 09/01/9831    Complicated by atrial fibrillation  . Pulmonary metastases ([place]) 07/24/2014  . Follicular lymphoma ([place])     Non Hodgkins B cell lymphoma; s/p chemo summer 2016--in remission as of 11/2014.  Marland Kitchen Olecranon bursitis of right elbow 02/2015  . Lip lesion 06/2015    Dr. Janace Hoard excised this: invasive SCC  . Dysrhythmia     A-fib  . Shortness of breath dyspnea     with exertion  . COPD (chronic obstructive pulmonary disease) ([place])     recently noted on Xray, does not have any problems  . Anxiety     pt denies  . Family history of adverse reaction to anesthesia     daughter has difficulty waking up    Past Surgical History  Procedure Laterality Date  . Breast biopsy    . Bone marrow biopsy  07/24/14  . Abdominal hysterectomy  1978  . Transthoracic echocardiogram  07/23/14    mild LVH, EF 50-55%, grade 2 diast dysfxn  . Upper gi endoscopy  09/23/2014    small hiatus hernia, nodules in stomach biopsied (chronic active erosive atrophic gastritis with intestinal metaplasia--no dysplasia or malignancy) otherwise normal  . Colonoscopy  09/23/14    small hemorrhoids, otherwise normal (performed for IDA and heme+ stool)  . Tee without cardioversion N/A 10/16/2014    Procedure: TRANSESOPHAGEAL ECHOCARDIOGRAM (TEE);  Surgeon: Lelon Perla, MD;  Location: [facility];  Service: Cardiovascular;  Laterality: N/A;  . Cardioversion N/A 10/16/2014    Procedure: CARDIOVERSION;  Surgeon: Lelon Perla, MD;  Location: [facility] ENDOSCOPY;  Service:  Cardiovascular;  Laterality: N/A;  . Nm myoview ltd  10/29/2014    medium size mild surgery defect in the mid anterior and apical anterior location suggestive of breast attenuation. No reversibility. LOW RISK  . Cardioversion N/A 11/30/2014    Procedure: CARDIOVERSION;  Surgeon: Larey Dresser, MD;  Location: [facility] ENDOSCOPY;  Service: Cardiovascular;  Laterality: N/A;  . Pfts  02/2015    Restriction with diffusion defect: cardiologist referred her to pulm to help interpret PFTs and decide whether she has amiodarone toxicity  . Colonoscopy    . Mass excision Left 07/15/2015    Procedure: EXCISION MASS;  Surgeon: Melissa Montane, MD;  Location: [facility];  Service: ENT;  Laterality: Left;  Wedge excision left lower lip mass    Prior to Admission medications   Medication Sig Start Date End Date Taking? Authorizing Provider  ALPRAZolam (XANAX) 0.5 MG tablet 1 tab po qhs prn anxiety-related insomnia Patient taking differently: Take 0.5 mg by mouth at bedtime as needed for anxiety or sleep.  03/11/15  Yes Adrian Blackwater McGowen,  MD  amiodarone (PACERONE) 200 MG tablet Take 1 tablet (200 mg total) by mouth daily. 06/25/15  Yes Tammi Sou, MD  apixaban (ELIQUIS) 5 MG TABS tablet Take 1 tablet (5 mg total) by mouth 2 (two) times daily. 05/11/15  Yes Sherran Needs, NP  cyanocobalamin (,VITAMIN B-12,) 1000 MCG/ML injection Inject 1 mL (1,000 mcg total) into the skin every 30 (thirty) days. 06/29/15  Yes Noralee Space, MD  ergocalciferol (VITAMIN D2) 50000 UNITS capsule Take 50,000 Units by mouth every Sunday.    Yes Historical Provider, MD  ferrous sulfate 325 (65 FE) MG tablet Take 1 tablet (325 mg total) by mouth 2 (two) times daily with a meal. 07/29/14  Yes Ladell Pier, MD  fish oil-omega-3 fatty acids 1000 MG capsule Take 2 g by mouth daily.   Yes Historical Provider, MD  furosemide (LASIX) 40 MG tablet Take '40mg'$  by mouth in AM and take '20mg'$  by mouth in PM Patient taking differently: Take 20-40 mg by mouth 2 (two)  times daily. Take '40mg'$  by mouth in AM and take '20mg'$  by mouth in PM 11/27/14  Yes Sherran Needs, NP  levothyroxine (SYNTHROID, LEVOTHROID) 100 MCG tablet Take 1 tablet (100 mcg total) by mouth daily before breakfast. 06/25/15  Yes Tammi Sou, MD  metoprolol succinate (TOPROL-XL) 25 MG 24 hr tablet Take 25 mg by mouth 2 (two) times daily. 01/26/15  Yes Historical Provider, MD  OXYGEN Inhale into the lungs. 1Lt at bedtime   Yes Historical Provider, MD  potassium chloride 20 MEQ TBCR Take 20 mEq by mouth daily. 11/25/14  Yes Sherran Needs, NP  pravastatin (PRAVACHOL) 80 MG tablet Take 1 tablet (80 mg total) by mouth daily. 03/11/15  Yes Tammi Sou, MD  prochlorperazine (COMPAZINE) 10 MG tablet Take 10 mg by mouth every 8 (eight) hours as needed for nausea or vomiting. Reported on 08/05/2015   Yes Historical Provider, MD     Allergies  Allergen Reactions  . Iodinated Diagnostic Agents Rash and Other (See Comments)    CT Contrast    Social History   Social History  . Marital Status: Married    Spouse Name: N/A  . Number of Children: 8  . Years of Education: N/A   Occupational History  . retired    Social History Main Topics  . Smoking status: Never Smoker   . Smokeless tobacco: Never Used  . Alcohol Use: No  . Drug Use: No  . Sexual Activity: Not Asked   Other Topics Concern  . None   Social History Narrative   Widowed, has 8 children.   Orig from [place], now lives in [place].   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   No tob/alc/drugs.   Family History  Problem Relation Age of Onset  . Melanoma Mother   . Stomach cancer Father   . Lymphoma Sister   . Diabetes Daughter     Wt Readings from Last 3 Encounters:  08/09/15 190 lb 9.6 oz (86.456 kg)  08/05/15 188 lb 11.2 oz (85.594 kg)  07/15/15 187 lb (84.823 kg)    PHYSICAL EXAM BP 159/69 mmHg  Pulse 62  Ht '5\' 6"'$  (1.676 m)  Wt 190 lb 9.6 oz (86.456 kg)  BMI 30.78  kg/m2 General appearance: alert, cooperative, appears stated age, no acute distress. Healthy-appearing Neck: no adenopathy, no carotid bruit and mildly elevated JVD we cannon A waves Lungs: normal  percussion bilaterally and Mild, diffuse interstitial sounds bilaterally. Nonlabored. Good air movement. Heart: RRR. Normal S1 & S2. Nondisplaced PMI. Soft 1-2/6 SEM. No R./G. Abdomen: soft, non-tender; bowel sounds normal; no masses, no organomegaly Extremities: 2-+ edema bilaterally distal leg to ankles Pulses: 2+ and symmetric Skin: Skin color, texture, turgor normal. No lesions.  Neurologic/Psych : Alert and oriented X 3, normal strength and tone. Normal symmetric reflexes. Normal coordination and gait. CN II-XII grossly intact. Pleasant mood and affect..    Adult ECG Report  Sinus rhythm with 1 AVB. Rate 60 BPM. PR interval 262. Otherwise normal axis, intervals and durations with stable EKG.  Other studies Reviewed: Additional studies/ records that were reviewed today include:  Recent Labs:   Lab Results  Component Value Date   TSH 3.30 05/10/2015  -- Recently increased Synthroid dosing.   Lab Results  Component Value Date   K 4.2 07/14/2015   Lab Results  Component Value Date   ALT 16 05/10/2015   AST 19 05/10/2015   ALKPHOS 62 05/10/2015   BILITOT 0.4 05/10/2015    ASSESSMENT / PLAN: Problem List Items Addressed This Visit    Paroxysmal atrial fibrillation with RVR (HCC) (Chronic)    Maintaining sinus rhythm on amiodarone. Also on twice a day Toprol. No signs of bradycardia. The doses split to twice a day to avoid hypotension. Notably improved orthostatic dizziness. CHA2DS2VASC = 4.  On apixaban for anticoagulation      Relevant Medications   furosemide (LASIX) 40 MG tablet   Other Relevant Orders   EKG 12-Lead (Completed)   On amiodarone therapy (Chronic)    Need to follow thyroid levels and LFTs at least annually. Also needs annual exams.      Essential  hypertension - Primary (Chronic)    Blood pressure is actually higher now. She previously been on lots of medications. I think if it continues to be elevated would need to restart ARB or ACE inhibitor.      Relevant Medications   furosemide (LASIX) 40 MG tablet   Other Relevant Orders   EKG 12-Lead (Completed)   Chronic diastolic CHF (congestive heart failure), NYHA class 2 (HCC) (Chronic)    Relatively stable symptoms. Remains on Lasix. I think with some increasing edema what we'll do is change her Lasix prescription to 40 mg twice a day. She will continue to take it as is but will have the freedom of taking a full 40 mg in the evening if necessary.      Relevant Medications   furosemide (LASIX) 40 MG tablet      Current medicines are reviewed at length with the patient today. (+/- concerns) n/a The following changes have been made: n/a  CONTINUE [place].  Dose Lasix accordingly    Your physician wants you to follow-up in [place].  Studies Ordered:   Orders Placed This Encounter  Procedures  . EKG 12-Lead       Glenetta Hew, M.D., M.S. Interventional Cardiologist   Pager # [phone]

## 2015-08-10 ENCOUNTER — Encounter: Payer: Self-pay | Admitting: Cardiology

## 2015-08-10 NOTE — Assessment & Plan Note (Signed)
Blood pressure is actually higher now. She previously been on lots of medications. I think if it continues to be elevated would need to restart ARB or ACE inhibitor.

## 2015-08-10 NOTE — Assessment & Plan Note (Signed)
Need to follow thyroid levels and LFTs at least annually. Also needs annual exams.

## 2015-08-10 NOTE — Assessment & Plan Note (Signed)
Maintaining sinus rhythm on amiodarone. Also on twice a day Toprol. No signs of bradycardia. The doses split to twice a day to avoid hypotension. Notably improved orthostatic dizziness. CHA2DS2VASC = 4.  On apixaban for anticoagulation

## 2015-08-10 NOTE — Assessment & Plan Note (Signed)
Relatively stable symptoms. Remains on Lasix. I think with some increasing edema what we'll do is change her Lasix prescription to 40 mg twice a day. She will continue to take it as is but will have the freedom of taking a full 40 mg in the evening if necessary.

## 2015-10-04 ENCOUNTER — Encounter: Payer: Self-pay | Admitting: Family Medicine

## 2015-10-04 ENCOUNTER — Ambulatory Visit (INDEPENDENT_AMBULATORY_CARE_PROVIDER_SITE_OTHER): Payer: PPO | Admitting: Pulmonary Disease

## 2015-10-04 VITALS — BP 124/70 | HR 48 | Temp 97.2°F | Ht 66.0 in | Wt 192.5 lb

## 2015-10-04 DIAGNOSIS — R942 Abnormal results of pulmonary function studies: Secondary | ICD-10-CM

## 2015-10-04 DIAGNOSIS — Z23 Encounter for immunization: Secondary | ICD-10-CM | POA: Diagnosis not present

## 2015-10-04 DIAGNOSIS — I480 Paroxysmal atrial fibrillation: Secondary | ICD-10-CM | POA: Diagnosis not present

## 2015-10-04 DIAGNOSIS — I10 Essential (primary) hypertension: Secondary | ICD-10-CM | POA: Diagnosis not present

## 2015-10-04 DIAGNOSIS — R0602 Shortness of breath: Secondary | ICD-10-CM | POA: Diagnosis not present

## 2015-10-04 DIAGNOSIS — I5032 Chronic diastolic (congestive) heart failure: Secondary | ICD-10-CM | POA: Diagnosis not present

## 2015-10-04 DIAGNOSIS — C8200 Follicular lymphoma grade I, unspecified site: Secondary | ICD-10-CM | POA: Diagnosis not present

## 2015-10-04 DIAGNOSIS — D6489 Other specified anemias: Secondary | ICD-10-CM | POA: Diagnosis not present

## 2015-10-04 NOTE — Progress Notes (Signed)
Subjective:     Patient ID: Amanda Sellers, female   DOB: [birth year], 82 y.o.   MRN: [id number]  HPI    82 y/o WF, referred by Amanda Sellers due to an abnormal PFT revealing a low DLCO, pt is on amiodarone for AFib...   ~  May 10, 2015:  Initial Pulmonary consultation by Amanda Sellers>  She is here today w/ her daughter Amanda Sellers, an RN from the ICU at [facility];  Her PCP is Amanda Sellers in [place]...    82 y/o WF referred by Amanda Sellers for a decr DLCO on Amiodarone for AFib... She notes DOE w/ exertion but states that it's getting better w/ a walking regimen & feels that she is getting stronger;  She has a hx of a follicular lymphoma and had chemoRx from Amanda Sellers (his note of 03/29/15 is reviewed)- diagnosed 06/2014 & treated w/ 4 cycles of bendamustine/ rituximab w/ f/u CT showing signif improvement in adenopathy, lung nodules and renal lesions;  NOTE> she was placed on O2 for nocturnal use by Amanda Sellers during her ChemoRx, uses it prn only but doesn't want to give it up!... She saw Amanda Sellers last 01/2015 for f/u AFib & CHF- on Amiodarone since 11/2014;  NOTE> she states her breathing is much better since her successful 2nd DCCV=> NSR...  2DEcho 07/22/14:  Mild LVH, borderline LVF w/ EF=50-55%, no regional wall motion abn, Gr2DD, trivAI, calcif mitral annulus, mod LAdil at 39m, PAsys=369mg  Myoview 10/29/2014:  There is a medium defect of mild severity present in the mid anterior and apical anterior location. This is a low risk study. Breast attenuation artifact Study would not gate secondary to afib and PVCs.  TEE 10/16/2014: Normal LV function (EF 55-60%); biatrial enlargment; spontaneous contrast in LAA but no thrombus; mild AI; moderate MR (2+); mild TR.  Smoking Hx>  She is a never smoker.  Pulmonary Hx>  No prev hx of lung dis- denies hx asthma, no prev bouts of bronchitis, never had pneumonia, no hx TB or known exposure.    Cardiac Hx>  Adm 09/2014 w/ AFlutter & rvr, s/p cardioversion x2 (last 11/2014); HBP,  Chronic diastolic CHF, Valvular dis w/ modMR & mild AI  Medical Hx>  Hx non-Hodgkins Lymphoma 06/2014 treated w/ bendamustine/ rituximab by Amanda Sellers; Hx microcytic anemia & scant Fe stores c/w IDA, hx of B12 defic- prev on shots, Hypothyroid, Anxiety  Family Hx>  FamHx is NEG for any lung diseases...  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  Current Meds>  Last ChemoRx 10/2015; Amio200, Eliquis5Bid, ToprolXL25Bid, Lasix40AM&20PM, K20, Prav80, Synthroid100, Xanax0.5, Fe-Bid, VitD,   EXAM shows Afeb, VSS, O2sat=97% on RA at rest;  HEENT- neg, mallampati2;  Chest- clear w/o w/r/r;  Heart- RR Gr1/6 SEM w/o r/g;  Abd- soft, nontender, neg;  Ext- neg w/o c/c/e;  Neuro- intact...   Last CT Chest 11/17/14>  Improved thor adenopathy, modHH, norm heart size, diffuse atherosclerosis, R>L pleural effusions, diminished bilat pulm nodules (largest 10m43mIn lingula), mild subpleural reticulation & interstitial prom  Last CXR 11/25/14>  Cardiomegaly & pul vasc congestion, atherosclerotic calcif in Ao, peribronch thickening & flattened diaph w/ bibasilar atx, osteopenia and T12 compression (chronic)  PFTs 02/26/15>  FVC=2.12 (74%), FEV1=1.70 (80%), %1sec=80, mid-flows wnl at 91% predicted; TLC=5.41 (101%), RV=3.42 (136%), RV/TLC=63%; DLCO=56% predicted (DL/VA=82% predicted)  CXR today 05/10/15>  Borderline heart size, some hyperinflation noted, clear lungs, NAD...   LABS 05/10/15>  Chems-  ok x BS=110, Cr=1.13;  CBC- Hg=11.0, MCV=84;  Fe=52 (12%sat), Ferritin=37;  TSH=3.30, FreeT4=1.31;  B12=210;  Folate>23 IMP >>     Abnormal PFT w/ norm airflow, norm lung volumes, air trapping & decr DLCO=56%>  This is a baseline PFT study done 02/2015 & Amio was started 10/2014; DLCO also affected by lung vol & anemia...    CT Chest 10/2014 had some CHF w/ interstitial prominence, sm bilat effusions, but improved adenopathy & diminished pulm  nodules s/p chemotherapy for lymphoma    HBP> on MetopER25Bid, Lasix40AM&20PM, K20    Hx PAF> on Eliquis5Bid, Amiodarone200; she is holding NSR on these meds...    Chr diastolic CHF> on meds above; last BNP was 11/2014= 378...    Hyperlipidemia> on Prav80 + Fish Oil    Hypothyroid> on Synthroid100    Osteopenia and T12 compression    Hx non-hodgkins lymphoma> dx 6/16 & treated w/ 4 cycles chemoRx (last 10/28/14)    Anemia> on FeBid since 6/16 & followed by Amanda Sellers; last CBC 03/11/15 showed Hg=10.6, MCV=81; will discuss w/ Amanda Sellers re: poss IV iron therapy & she definitely needs to get back on her B12 supplementation.    Hx B12 defic> she is not currently taking B12 shots or supplements;  Vit B12 blood level 04/2015 off supplement for ?39yr 210 PLAN >>     SAadyaindicates that her DOE is improving, getting better "It helps me to walk" she notes;  We are hindered by the timing of her studies- starting on Amio 10/2014, last CTChest 10/2014, 1st PFT 02/2015, her chronic diastolic CHF, mild anemia, etc... She will continue same meds, continue to exercise, f/u w/ Oncology & we will recheck pt in 6 weeks...   ~  June 29, 2015:  6wk ROV w/ Amanda Sellers>  Amanda Sellers that her dyspnea is much improved overall- she denies CP, cough, sput, palpit, dizziness, edema, etc;  She says she developed a fever blister on her lower lip ~3wks ago, "couldn't get an appt w/ Derm until end of the month";  Now w/ large round lesion left side lower lip => refer to ENT NOW...   Prob list as noted above- on O2 "prn" per Amanda Sellers (she uses it occas at night), Eliquis5Bid, Amio200, Metop25Bid, Lasix40AM&20PM, K20, Prav80, Fe-Bid, VitD50K/wk, Synthroid100, Alpraz0.5prn... Her B12 level was 210 04/2015 & she is asked to start VitB12 shots Qmo again (daughter will give these to her at home)...    She had f/u AFib clinic 44/97/02 AFib, diastolicCHF; she had TEE & DCCV 09/2014, then recurrent AFib & again cardioverted 11/2014, maintaining  NSR since then- their extensive note is reviewed & she is much improved at present...    She saw Amanda Sellers 36/3/78>f/u on her follicular lymphoma, anemia (Fe de427f & hx B12 defic); felt to be in clinical remission & he continues to follow Q4m77mo    She saw DrMcGowen 06/25/15- note reviewed... IMP/PLAN>>   She is rec to continue current meds; restart B12 shots monthly per her daughter who is an RN,Therapist, sportsefer to ENT for Bx/ excision of left lower lip lesion... We plan ROV recheck in 8mo40mo ~  October 04, 2015:  8mo 91mow/ Amanda Sellers>  Amanda Sellers and notes some SOB in hot weather & w/ exertion; stress also contributes as her sister passed away recently; she remains too sedentary & not getting regular exercise... 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    CT Chest 10/2014 had some CHF w/ interstitial prominence, sm bilat effusions, but improved adenopathy & diminished pulm nodules s/p chemotherapy for lymphoma    HBP> on MetopER25Bid, Lasix40Bid, K20;  BP= 124/70 7 she denies CP, palpit, dizzy, edema...    Hx PAF> on Eliquis5Bid, Amiodarone200; she is holding NSR on these meds & followed by Amanda Sellers- seen 08/09/15 w/ HBP, mild diastolicCHF & PAF; no change in meds...    Chr diastolic CHF> on meds above; last BNP was 11/2014= 378...    Hyperlipidemia> on Prav80 + Fish Oil; last FLP was 11/16 showing TChol 150, TG 135, HDL 49, LDL 74    Hypothyroid> on Synthroid100; last TSH was 4/17 showing TSH= 3.30    Osteopenia and T12 compression> Imaging reveals stable chr T12 compression fx; ?last BMD 2008 w/ lowest Tscore -2.3 in left FemNeck on WomensMVI, VitD 50K/wk, wt bearing exercise but no bone building  meds...    Hx non-hodgkins lymphoma> dx 6/16 & treated w/ 4 cycles chemoRx (last 10/28/14)- Imaging 10/16 w/ signif decr adenopathy in chest/ abd/ pelvis, resolution of splenomegaly, decr in pulm & renal nodularity, incr bilat pleural effusions & basilar atx; she last saw Amanda Sellers 08/05/15- good appetite & energy level, he felt she remained in remission & planed f/u 61mo   Anemia> on FeBid since 6/16 & followed by Amanda Sellers; last CBC 7/17 showed Hg=10.9, MCV=87...    Hx B12 defic> Vit B12 blood level 04/2015 off supplement for ?156yr210; we restarted VitB12 shots=> Vit B12 level 9/17 = 484 EXAM shows Afeb, VSS, O2sat=98% on RA at rest;  HEENT- neg, mallampati2, NEW- large lesion lower lip;  Chest- clear w/o w/r/r;  Heart- RR Gr1/6 SEM w/o r/g;  Abd- soft, nontender, neg;  Ext- VI w/o c/c/e;  Neuro- intact...  IMP/PLAN>>  Note- pt has O2 per Amanda Sellers & states "I use it at night when I need it for SOB"; OK 2017 Flu vaccine today; we reviewed low carb diet & incr exercise program... we plan rov recheck 71m98mo    Past Medical History:  Diagnosis Date  . Anticoagulation adequate, Eliquis 10/16/2014  . Anxiety    pt denies  . Atrial fibrillation ([place]CC[place]  With RVR; elec cardioversion 11/30/14.  On Amio since 10/2014; Dr. NadLenna Gilfordllowing her from pulm standpoint regarding this med.  . Chronic diastolic congestive heart failure (HCCAppleton9/48/25/0037Complicated by atrial fibrillation; Echo 07/23/14: mild LVH, EF 50-55%, grade 2 diast dysfxn  . COPD (chronic obstructive pulmonary disease) (HCCWestern  recently noted on Xray, does not have any problems  . Dysrhythmia    A-fib  . Family history of adverse reaction to anesthesia    daughter has difficulty waking up  . Follicular lymphoma ([place]CCAugust[place]  Non Hodgkins B cell lymphoma; s/p chemo summer 2016--in remission as of 11/2014.  . HMarland KitchenD (hyperlipidemia)     06/2014  . HTN (hypertension)   . Hypothyroidism   . Lip lesion 06/2015   Dr. ByeJanace Hoardcised this:  invasive SCC  . Microcytic anemia    transfused 3 U total in hosp 07/2014  . Olecranon bursitis of right elbow 02/2015  . Pancytopenia due to antineoplastic chemotherapy ([place]CC[place] Station .  Positive occult stool blood test 08/08/14   Endoscopies ok 08/2014  . Pulmonary metastases ([place]) 07/24/2014  . Shortness of breath dyspnea    with exertion  . Vitamin B 12 deficiency   . Vitamin D deficiency     Past Surgical History:  Procedure Laterality Date  . ABDOMINAL HYSTERECTOMY  1978  . BONE MARROW BIOPSY  07/24/14  . BREAST BIOPSY    . CARDIOVERSION N/A 10/16/2014   Procedure: CARDIOVERSION;  Surgeon: Lelon Perla, MD;  Location: [facility] ENDOSCOPY;  Service: Cardiovascular;  Laterality: N/A;  . CARDIOVERSION N/A 11/30/2014   Procedure: CARDIOVERSION;  Surgeon: Larey Dresser, MD;  Location: [facility] ENDOSCOPY;  Service: Cardiovascular;  Laterality: N/A;  . COLONOSCOPY  09/23/14   small hemorrhoids, otherwise normal (performed for IDA and heme+ stool)  . COLONOSCOPY    . MASS EXCISION Left 07/15/2015   Procedure: EXCISION MASS;  Surgeon: Melissa Montane, MD;  Location: [facility];  Service: ENT;  Laterality: Left;  Wedge excision left lower lip mass  . NM MYOVIEW LTD  10/29/2014   medium size mild surgery defect in the mid anterior and apical anterior location suggestive of breast attenuation. No reversibility. LOW RISK  . PFTs  02/2015   Restriction with diffusion defect: cardiologist referred her to pulm to help interpret PFTs and decide whether she has amiodarone toxicity  . TEE WITHOUT CARDIOVERSION N/A 10/16/2014   Procedure: TRANSESOPHAGEAL ECHOCARDIOGRAM (TEE);  Surgeon: Lelon Perla, MD;  Location: [facility] ENDOSCOPY;  Service: Cardiovascular;  Laterality: N/A;  . TRANSTHORACIC ECHOCARDIOGRAM  07/23/14   mild LVH, EF 50-55%, grade 2 diast dysfxn  . UPPER GI ENDOSCOPY  09/23/2014   small hiatus hernia, nodules in stomach biopsied (chronic active erosive atrophic gastritis with intestinal metaplasia--no dysplasia or  malignancy) otherwise normal    Outpatient Encounter Prescriptions as of 10/04/2015  Medication Sig  . ALPRAZolam (XANAX) 0.5 MG tablet 1 tab po qhs prn anxiety-related insomnia (Patient taking differently: Take 0.5 mg by mouth at bedtime as needed for anxiety or sleep. )  . amiodarone (PACERONE) 200 MG tablet Take 1 tablet (200 mg total) by mouth daily.  Marland Kitchen apixaban (ELIQUIS) 5 MG TABS tablet Take 1 tablet (5 mg total) by mouth 2 (two) times daily.  . cyanocobalamin (,VITAMIN B-12,) 1000 MCG/ML injection Inject 1 mL (1,000 mcg total) into the skin every 30 (thirty) days.  . ergocalciferol (VITAMIN D2) 50000 UNITS capsule Take 50,000 Units by mouth every Sunday.   . ferrous sulfate 325 (65 FE) MG tablet Take 1 tablet (325 mg total) by mouth 2 (two) times daily with a meal.  . fish oil-omega-3 fatty acids 1000 MG capsule Take 2 g by mouth daily.  . furosemide (LASIX) 40 MG tablet Take 1 tablet (40 mg total) by mouth 2 (two) times daily.  Marland Kitchen levothyroxine (SYNTHROID, LEVOTHROID) 100 MCG tablet Take 1 tablet (100 mcg total) by mouth daily before breakfast.  . metoprolol succinate (TOPROL-XL) 25 MG 24 hr tablet Take 25 mg by mouth 2 (two) times daily.  . OXYGEN Inhale into the lungs. 1Lt at bedtime  . potassium chloride 20 MEQ TBCR Take 20 mEq by mouth daily.  . pravastatin (PRAVACHOL) 80 MG tablet Take 1 tablet (80 mg total) by mouth daily.  . prochlorperazine (COMPAZINE) 10 MG tablet Take 10 mg by mouth every 8 (eight) hours as needed for nausea or vomiting. Reported on 08/05/2015   No facility-administered encounter medications on file as of 10/04/2015.     Allergies  Allergen Reactions  . Iodinated Diagnostic Agents Rash and Other (See Comments)    CT Contrast    Immunization History  Administered Date(s) Administered  . Influenza,inj,Quad PF,36+ Mos 10/27/2014, 10/04/2015  . Pneumococcal-Unspecified 01/23/2014  . Tdap 12/09/2014  . Zoster 03/19/2012    Family History  Problem  Relation Age of Onset  . Melanoma Mother   . Stomach cancer Father   . Lymphoma Sister   . Diabetes Daughter     Social History   Social History  . Marital status: Married    Spouse name: N/A  . Number of children: 8  . Years of education: N/A   Occupational History  . retired    Social History Main Topics  . Smoking status: Never Smoker  . Smokeless tobacco: Never Used  . Alcohol use No  . Drug use: No  . Sexual activity: Not on file   Other Topics Concern  . Not on file   Social History Narrative   Widowed, has 8 children.   Orig from [place], now lives in [place].   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   No tob/alc/drugs.    Current Medications, Allergies, Past Medical History, Past Surgical History, Family History, and Social History were reviewed in [employer] record.   Review of Systems             All symptoms NEG except where BOLDED >>  Constitutional:  F/C/S, fatigue, anorexia, unexpected weight change. HEENT:  HA, visual changes, hearing loss, earache, nasal symptoms, sore throat, mouth sores, hoarseness. Resp:  cough, sputum, hemoptysis; SOB, tightness, wheezing. Cardio:  CP, palpit, DOE, orthopnea, edema. GI:  N/V/D/C, blood in stool; reflux, abd pain, distention, gas. GU:  dysuria, freq, urgency, hematuria, flank pain, voiding difficulty. MS:  joint pain, swelling, tenderness, decr ROM; neck pain, back pain, etc. Neuro:  HA, tremors, seizures, dizziness, syncope, weakness, numbness, gait abn. Skin:  suspicious lesions or skin rash. Heme:  adenopathy, bruising, bleeding. Psyche:  confusion, agitation, sleep disturbance, hallucinations, anxiety, depression suicidal.   Objective:   Physical Exam       Vital Signs:  Reviewed...   General:  WD, WN, 82 y/o WF in NAD; alert & oriented; pleasant & cooperative... HEENT:  [place]/AT; Conjunctiva- pink, Sclera- nonicteric, EOM-wnl, PERRLA,  EACs-clear, TMs-wnl; NOSE-clear; THROAT-clear & wnl.  Neck:  Supple w/ fair ROM; no JVD; normal carotid impulses w/o bruits; no thyromegaly or nodules palpated; no lymphadenopathy.  Chest:  Clear to P & A; without wheezes, rales, or rhonchi heard. Heart:  Regular Rhythm; Gr1/6 SEM, without rubs or gallops detected. Abdomen:  Soft & nontender- no guarding or rebound; normal bowel sounds; no organomegaly or masses palpated. Ext:  decrROM; without deformities +arthritic changes; no varicose veins, venous insuffic, or edema;  Pulses intact w/o bruits. Neuro:  CNs II-XII intact; motor testing normal; sensory testing normal; gait normal & balance OK. Derm:  No lesions noted; no rash etc. Lymph:  No cervical, supraclavicular, axillary, or inguinal adenopathy palpated.   Assessment:      IMP >>     Abnormal PFT w/ norm airflow, norm lung volumes, air trapping & decr DLCO=56%>  This is a baseline PFT study done 02/2015 & Amio was started 10/2014; DLCO also affected by lung vol & anemia...    CT Chest 10/2014 had some CHF w/ interstitial prominence, sm bilat effusions, but improved adenopathy & diminished pulm nodules s/o chemotherapy for lymphoma  HBP> on MetopER25Bid, Lasix40AM&20PM, K20    Hx PAF> on Eliquis5Bid, Amiodarone200; she is holding NSR on these meds...    Chr diastolic CHF> on meds above; last BNP was 11/2014= 378...    Hyperlipidemia> on Prav80 + Fish Oil    Hypothyroid> on Synthroid100    Osteopenia and T12 compression    Hx non-hodgkins lymphoma> dx 6/16 & treated w/ 4 cycles chemoRx (last 10/28/14)    Anemia> on FeBid since 6/16 & followed by Amanda Sellers; last CBC 03/11/15 showed Hg=10.6, MCV=81; will discuss w/ Amanda Sellers re: poss IV iron therapy & she definitely needs to get back on her B12 supplementation.    Hx B12 defic> she is not currently taking B12 shots or supplements & B12 level 04/2015 = 210; rec to restart her B12 shots 1080mg/mo (admit by her RN daughter)... 06/29/15>  lesion on left lower lip => refer to ENT ASAP for excision...  PLAN >>   05/10/15>   SJaquelynnindicates that her DOE is improving, getting better "It helps me to walk" she notes;  We are indered by the timing of her studies- starting on Amio 10/2014, last CTChest 10/2014, 1st PFT 02/2015, her chronic diastolic CHF, mild anemia, etc... She will continue same meds, continue to exercise, f/u w/ Oncology & we will recheck pt in 6 weeks...  06/29/15>   She is rec to continue current meds; restart B12 shots monthly per her daughter who is an RTherapist, sports refer to ENT for Bx/ excision of left lower lip lesion. 10/04/15>   Note- pt has O2 per Amanda Sellers & states "I use it at night when I need it for SOB"; OK 2017 Flu vaccine today; we reviewed low carb diet & incr exercise program... we plan rov recheck 664mo   Plan:     Patient's Medications  New Prescriptions   No medications on file  Previous Medications   ALPRAZOLAM (XANAX) 0.5 MG TABLET    1 tab po qhs prn anxiety-related insomnia   AMIODARONE (PACERONE) 200 MG TABLET    Take 1 tablet (200 mg total) by mouth daily.   APIXABAN (ELIQUIS) 5 MG TABS TABLET    Take 1 tablet (5 mg total) by mouth 2 (two) times daily.   CYANOCOBALAMIN (,VITAMIN B-12,) 1000 MCG/ML INJECTION    Inject 1 mL (1,000 mcg total) into the skin every 30 (thirty) days.   ERGOCALCIFEROL (VITAMIN D2) 50000 UNITS CAPSULE    Take 50,000 Units by mouth every Sunday.    FERROUS SULFATE 325 (65 FE) MG TABLET    Take 1 tablet (325 mg total) by mouth 2 (two) times daily with a meal.   FISH OIL-OMEGA-3 FATTY ACIDS 1000 MG CAPSULE    Take 2 g by mouth daily.   FUROSEMIDE (LASIX) 40 MG TABLET    Take 1 tablet (40 mg total) by mouth 2 (two) times daily.   LEVOTHYROXINE (SYNTHROID, LEVOTHROID) 100 MCG TABLET    Take 1 tablet (100 mcg total) by mouth daily before breakfast.   METOPROLOL SUCCINATE (TOPROL-XL) 25 MG 24 HR TABLET    Take 25 mg by mouth 2 (two) times daily.   OXYGEN    Inhale into the lungs. 1Lt  at bedtime   POTASSIUM CHLORIDE 20 MEQ TBCR    Take 20 mEq by mouth daily.   PRAVASTATIN (PRAVACHOL) 80 MG TABLET    Take 1 tablet (80 mg total) by mouth daily.   PROCHLORPERAZINE (COMPAZINE) 10 MG TABLET    Take 10 mg by  mouth every 8 (eight) hours as needed for nausea or vomiting. Reported on 08/05/2015  Modified Medications   No medications on file  Discontinued Medications   No medications on file

## 2015-10-04 NOTE — Patient Instructions (Signed)
Today we updated your med list in our EPIC system...    Continue your current medications the same...  We discussed DIET & weight reduction, and EXERCISE to help your shortness of breath w/ activity...  We gave you the 2017 flu shot today...  Call for any questions...  Let's plan a follow up visit in 2mo, sooner if needed for breathing problems.Marland KitchenMarland Kitchen

## 2015-10-08 ENCOUNTER — Encounter: Payer: Self-pay | Admitting: Family Medicine

## 2015-10-08 ENCOUNTER — Ambulatory Visit (INDEPENDENT_AMBULATORY_CARE_PROVIDER_SITE_OTHER): Payer: PPO | Admitting: Family Medicine

## 2015-10-08 VITALS — BP 148/60 | HR 48 | Temp 97.6°F | Resp 16 | Wt 190.8 lb

## 2015-10-08 DIAGNOSIS — I10 Essential (primary) hypertension: Secondary | ICD-10-CM

## 2015-10-08 DIAGNOSIS — I5032 Chronic diastolic (congestive) heart failure: Secondary | ICD-10-CM | POA: Diagnosis not present

## 2015-10-08 DIAGNOSIS — E538 Deficiency of other specified B group vitamins: Secondary | ICD-10-CM

## 2015-10-08 DIAGNOSIS — E785 Hyperlipidemia, unspecified: Secondary | ICD-10-CM

## 2015-10-08 DIAGNOSIS — Z79899 Other long term (current) drug therapy: Secondary | ICD-10-CM

## 2015-10-08 DIAGNOSIS — I4891 Unspecified atrial fibrillation: Secondary | ICD-10-CM

## 2015-10-08 DIAGNOSIS — N182 Chronic kidney disease, stage 2 (mild): Secondary | ICD-10-CM | POA: Diagnosis not present

## 2015-10-08 LAB — COMPREHENSIVE METABOLIC PANEL
ALT: 16 U/L (ref 0–35)
AST: 18 U/L (ref 0–37)
Albumin: 4.1 g/dL (ref 3.5–5.2)
Alkaline Phosphatase: 60 U/L (ref 39–117)
BUN: 22 mg/dL (ref 6–23)
CO2: 33 mEq/L — ABNORMAL HIGH (ref 19–32)
Calcium: 9 mg/dL (ref 8.4–10.5)
Chloride: 101 mEq/L (ref 96–112)
Creatinine, Ser: 0.97 mg/dL (ref 0.40–1.20)
GFR: 58.55 mL/min — ABNORMAL LOW (ref >60.00)
Glucose, Bld: 95 mg/dL (ref 70–99)
Potassium: 4.4 mEq/L (ref 3.5–5.1)
Sodium: 141 mEq/L (ref 135–145)
Total Bilirubin: 0.5 mg/dL (ref 0.2–1.2)
Total Protein: 6.5 g/dL (ref 6.0–8.3)

## 2015-10-08 LAB — VITAMIN B12: Vitamin B-12: 484 pg/mL (ref 211–911)

## 2015-10-08 LAB — TSH: TSH: 4.59 u[IU]/mL — ABNORMAL HIGH (ref 0.35–4.50)

## 2015-10-08 MED ORDER — ALPRAZOLAM 0.5 MG PO TABS: ORAL_TABLET | ORAL | 1 refills | Status: DC

## 2015-10-08 MED ORDER — FUROSEMIDE 40 MG PO TABS: 40.0000 mg | ORAL_TABLET | Freq: Two times a day (BID) | ORAL | 6 refills | Status: DC

## 2015-10-08 NOTE — Progress Notes (Signed)
OFFICE VISIT  10/08/2015   CC:  Chief Complaint  Patient presents with  . Follow-up    HPI:    Patient is a 82 y.o. Caucasian female who presents accompanied by her daughter for 3 mo f/u HTN, chronic diast HF, a-fib, hypothyroidism. Recently had cardiology f/u and the only change made was her lasix dose in evening was increased to 32m if she feels like she needs it.  She has been taking it 449mbid and also trying to decrease sodium intake.  Recent f/u with oncologist, Dr. ShBenay Spicewas reassuring.  BP monitoring daily at home: 120s/70s, P 60 avg. No CP, no palpitations, no dizziness, no focal weakness.  No melena, hematochezia, or hematuria.  No excessive bruising.  Taking statin daily w/out side effect.  Past Medical History:  Diagnosis Date  . Anticoagulation adequate, Eliquis 10/16/2014  . Anxiety    pt denies  . Atrial fibrillation ([place]   With RVR; elec cardioversion 11/30/14.  On Amio since 10/2014; Dr. NaLenna Gilfordollowing her from pulm standpoint regarding this med.  . Chronic diastolic congestive heart failure ([id number]/74/0814 Complicated by atrial fibrillation; Echo 07/23/14: mild LVH, EF 50-55%, grade 2 diast dysfxn  . COPD (chronic obstructive pulmonary disease) ([facility]   recently noted on Xray, does not have any problems  . Dysrhythmia    A-fib  . Family history of adverse reaction to anesthesia    daughter has difficulty waking up  . Follicular lymphoma (HCIder   Non Hodgkins B cell lymphoma; s/p chemo summer 2016--in remission as of 11/2014.  . Marland KitchenLD (hyperlipidemia)     06/2014  . HTN (hypertension)   . Hypothyroidism   . Lip lesion 06/2015   Dr. ByJanace Hoardxcised this: invasive SCC  . Microcytic anemia    transfused 3 U total in hosp 07/2014  . Olecranon bursitis of right elbow 02/2015  . Pancytopenia due to antineoplastic chemotherapy (HCEast [place]  . Positive occult stool blood test 08/08/14   Endoscopies ok 08/2014  . Pulmonary metastases (HCQuinwood7/01/2014  . Shortness  of breath dyspnea    with exertion  . Vitamin B 12 deficiency   . Vitamin D deficiency     Past Surgical History:  Procedure Laterality Date  . ABDOMINAL HYSTERECTOMY  1978  . BONE MARROW BIOPSY  07/24/14  . BREAST BIOPSY    . CARDIOVERSION N/A 10/16/2014   Procedure: CARDIOVERSION;  Surgeon: BrLelon PerlaMD;  [facility];  Service: Cardiovascular;  Laterality: N/A;  . CARDIOVERSION N/A 11/30/2014   Procedure: CARDIOVERSION;  Surgeon: DaLarey DresserMD;  [facility];  Service: Cardiovascular;  Laterality: N/A;  . COLONOSCOPY  09/23/14   small hemorrhoids, otherwise normal (performed for IDA and heme+ stool)  . COLONOSCOPY    . MASS EXCISION Left 07/15/2015   Left lower lip mass--invasive SCC w/negative margins.  Procedure: EXCISION MASS;  Surgeon: JoMelissa MontaneMD;  Location: [facility] Service: ENT;  Laterality: Left;  Wedge excision left lower lip mass  . NM MYOVIEW LTD  10/29/2014   medium size mild surgery defect in the mid anterior and apical anterior location suggestive of breast attenuation. No reversibility. LOW RISK  . PFTs  02/2015   Restriction with diffusion defect: cardiologist referred her to pulm to help interpret PFTs and decide whether she has amiodarone toxicity  . TEE WITHOUT CARDIOVERSION N/A 10/16/2014   Procedure: TRANSESOPHAGEAL ECHOCARDIOGRAM (TEE);  Surgeon: BrLelon PerlaMD;  [facility];  Service: Cardiovascular;  Laterality: N/A;  . TRANSTHORACIC ECHOCARDIOGRAM  07/23/14   mild LVH, EF 50-55%, grade 2 diast dysfxn  . UPPER GI ENDOSCOPY  09/23/2014   small hiatus hernia, nodules in stomach biopsied (chronic active erosive atrophic gastritis with intestinal metaplasia--no dysplasia or malignancy) otherwise normal    Outpatient Medications Prior to Visit  Medication Sig Dispense Refill  . amiodarone (PACERONE) 200 MG tablet Take 1 tablet (200 mg total) by mouth daily. 180 tablet 3  . apixaban (ELIQUIS) 5 MG TABS tablet Take 1  tablet (5 mg total) by mouth 2 (two) times daily. 180 tablet 3  . cyanocobalamin (,VITAMIN B-12,) 1000 MCG/ML injection Inject 1 mL (1,000 mcg total) into the skin every 30 (thirty) days. 1 mL 6  . ergocalciferol (VITAMIN D2) 50000 UNITS capsule Take 50,000 Units by mouth every Sunday.     . ferrous sulfate 325 (65 FE) MG tablet Take 1 tablet (325 mg total) by mouth 2 (two) times daily with a meal. 30 tablet 1  . fish oil-omega-3 fatty acids 1000 MG capsule Take 2 g by mouth daily.    Marland Kitchen levothyroxine (SYNTHROID, LEVOTHROID) 100 MCG tablet Take 1 tablet (100 mcg total) by mouth daily before breakfast. 90 tablet 3  . metoprolol succinate (TOPROL-XL) 25 MG 24 hr tablet Take 25 mg by mouth 2 (two) times daily.    . OXYGEN Inhale into the lungs. 1Lt at bedtime    . potassium chloride 20 MEQ TBCR Take 20 mEq by mouth daily. 30 tablet 3  . pravastatin (PRAVACHOL) 80 MG tablet Take 1 tablet (80 mg total) by mouth daily. 90 tablet 3  . prochlorperazine (COMPAZINE) 10 MG tablet Take 10 mg by mouth every 8 (eight) hours as needed for nausea or vomiting. Reported on 08/05/2015    . ALPRAZolam (XANAX) 0.5 MG tablet 1 tab po qhs prn anxiety-related insomnia (Patient taking differently: Take 0.5 mg by mouth at bedtime as needed for anxiety or sleep. ) 90 tablet 1  . furosemide (LASIX) 40 MG tablet Take 1 tablet (40 mg total) by mouth 2 (two) times daily. 60 tablet 6   No facility-administered medications prior to visit.     Allergies  Allergen Reactions  . Iodinated Diagnostic Agents Rash and Other (See Comments)    CT Contrast    ROS As per HPI  PE: Blood pressure (!) 148/60, pulse (!) 48, temperature 97.6 F (36.4 C), temperature source Oral, resp. rate 16, weight 190 lb 12.8 oz (86.5 kg), SpO2 97 %. Gen: Alert, well appearing.  Patient is oriented to person, place, time, and situation. CV: Regular, bradycardic to about 50, soft systolic murmur w/out rub or gallop. Chest is clear, no wheezing or  rales. Normal symmetric air entry throughout both lung fields. No chest wall deformities or tenderness. EXT: no clubbing or cyanosis.  2+ bilat LE pitting edema from just below the knees down into feet.   LABS:  Lab Results  Component Value Date   TSH 3.30 05/10/2015   Lab Results  Component Value Date   WBC 3.6 (L) 08/05/2015   HGB 10.9 (L) 08/05/2015   HCT 33.4 (L) 08/05/2015   MCV 86.8 08/05/2015   PLT 160 08/05/2015   Lab Results  Component Value Date   CREATININE 0.85 07/14/2015   BUN 17 07/14/2015   NA 139 07/14/2015   K 4.2 07/14/2015   CL 101 07/14/2015   CO2 31 07/14/2015   Lab Results  Component Value Date  ALT 16 05/10/2015   AST 19 05/10/2015   ALKPHOS 62 05/10/2015   BILITOT 0.4 05/10/2015   Lab Results  Component Value Date   CHOL 150 12/09/2014   Lab Results  Component Value Date   HDL 49 12/09/2014   Lab Results  Component Value Date   LDLCALC 74 12/09/2014   Lab Results  Component Value Date   TRIG 135 12/09/2014   Lab Results  Component Value Date   CHOLHDL 3.1 12/09/2014    IMPRESSION AND PLAN:  1) HTN; The current medical regimen is effective;  continue present plan and medications. Checking lytes/cr today.  2) Chronic diastolic HF: wt is stable around 190-191 lbs over the last few months. The current medical regimen is effective;  continue present plan and medications. She is set to start an exercise class soon.  3) CRI stage 2: lytes/cr today.  4) Hypothyroidism: TSH monitoring today.  5) Vit B12 deficiency.  Has been on injections the last few months--most recent injection was about 2 wks ago. Check vit B12 level today.  6) Hyperlipidemia: tolerating statin.  AST/ALT normal 04/2015.  Due for repeat FLP at next f/u visit in 4 mo.  7) A-fib: in sinus on amio.  Doing well on eliquis 5 mg bid. Checking LFTs and TSH for monitoring while on amio today.  An After Visit Summary was printed and given to the patient.  FOLLOW  UP: Return in about 4 months (around 02/07/2016) for routine chronic illness f/u (fasting if possible).  Signed:  Crissie Sickles, MD           10/08/2015

## 2015-11-19 DIAGNOSIS — C004 Malignant neoplasm of lower lip, inner aspect: Secondary | ICD-10-CM | POA: Diagnosis not present

## 2015-11-22 ENCOUNTER — Encounter: Payer: Self-pay | Admitting: Family Medicine

## 2015-12-06 ENCOUNTER — Telehealth: Payer: Self-pay | Admitting: Oncology

## 2015-12-06 ENCOUNTER — Ambulatory Visit (HOSPITAL_BASED_OUTPATIENT_CLINIC_OR_DEPARTMENT_OTHER): Payer: PPO | Admitting: Oncology

## 2015-12-06 ENCOUNTER — Other Ambulatory Visit (HOSPITAL_BASED_OUTPATIENT_CLINIC_OR_DEPARTMENT_OTHER): Payer: PPO

## 2015-12-06 VITALS — BP 160/48 | HR 70 | Temp 97.7°F | Resp 18 | Ht 66.0 in | Wt 192.0 lb

## 2015-12-06 DIAGNOSIS — Z8572 Personal history of non-Hodgkin lymphomas: Secondary | ICD-10-CM

## 2015-12-06 DIAGNOSIS — I4892 Unspecified atrial flutter: Secondary | ICD-10-CM

## 2015-12-06 DIAGNOSIS — C8200 Follicular lymphoma grade I, unspecified site: Secondary | ICD-10-CM

## 2015-12-06 DIAGNOSIS — L989 Disorder of the skin and subcutaneous tissue, unspecified: Secondary | ICD-10-CM | POA: Diagnosis not present

## 2015-12-06 LAB — CBC WITH DIFFERENTIAL/PLATELET
BASO%: 0.2 % (ref 0.0–2.0)
Basophils Absolute: 0 10*3/uL (ref 0.0–0.1)
EOS%: 1.9 % (ref 0.0–7.0)
Eosinophils Absolute: 0.1 10*3/uL (ref 0.0–0.5)
HCT: 34.6 % — ABNORMAL LOW (ref 34.8–46.6)
HGB: 11.1 g/dL — ABNORMAL LOW (ref 11.6–15.9)
LYMPH%: 17.5 % (ref 14.0–49.7)
MCH: 28.3 pg (ref 25.1–34.0)
MCHC: 32.1 g/dL (ref 31.5–36.0)
MCV: 88.3 fL (ref 79.5–101.0)
MONO#: 0.6 10*3/uL (ref 0.1–0.9)
MONO%: 12 % (ref 0.0–14.0)
NEUT#: 3.3 10*3/uL (ref 1.5–6.5)
NEUT%: 68.4 % (ref 38.4–76.8)
Platelets: 165 10*3/uL (ref 145–400)
RBC: 3.92 10*6/uL (ref 3.70–5.45)
RDW: 15 % — ABNORMAL HIGH (ref 11.2–14.5)
WBC: 4.8 10*3/uL (ref 3.9–10.3)
lymph#: 0.8 10*3/uL — ABNORMAL LOW (ref 0.9–3.3)

## 2015-12-06 NOTE — Telephone Encounter (Signed)
Appointments scheduled per 12/06/15 los. AVS report and appointment schedule given to patient per 12/06/15 los.  °

## 2015-12-06 NOTE — Progress Notes (Signed)
   [place] OFFICE PROGRESS NOTE   Diagnosis: Non-Hodgkin's lymphoma  INTERVAL HISTORY:   Ms. Harman returns as scheduled. She feels well. She is working part-time as a [employer]. No palpable lymph nodes. Occasional dyspnea.  Objective:  Vital signs in last 24 hours:  Blood pressure (!) 160/48, pulse 70, temperature 97.7 F (36.5 C), temperature source Oral, resp. rate 18, height _0  (1.676 m), weight 192 lb (87.1 kg), SpO2 97 %.    HEENT: Neck without mass Lymphatics: No cervical, supraclavicular, axillary, or inguinal nodes Resp: Lungs clear bilaterally Cardio: Regular rate and rhythm GI: No hepatosplenomegaly, no mass, nontender Vascular: No leg edema   Lab Results:  Lab Results  Component Value Date   WBC 4.8 12/06/2015   HGB 11.1 (L) 12/06/2015   HCT 34.6 (L) 12/06/2015   MCV 88.3 12/06/2015   PLT 165 12/06/2015   NEUTROABS 3.3 12/06/2015     Medications: I have reviewed the patient's current medications.  Assessment/Plan:  1.Non-Hodgkin's Lymphoma-bone marrow biopsy 07/24/2014 consistent with involvement by follicular B-cell lymphoma, CD20 positive   CTs of the chest 07/22/2014 and CT of the abdomen and pelvis on 07/23/2014-pulmonary nodules, hilar/mediastinal/supraclavicular/axillary adenopathy, splenomegaly, abdominal adenopathy, bilateral adrenal nodules   Cycle 1 bendamustine/rituximab 08/04/2014  Cycle 2 bendamustine/Rituxan 09/01/2014  Cycle 3 bendamustine/Rituxan 09/30/2014  Cycle 4 bendamustine/rituximab 10/27/2014  Restaging CT scans 11/17/2014 with significant improvement in diffuse lymphadenopathy, lung nodules, and renal lesions 2. Severe microcytic anemia, status post transfusion with packed red blood cells 07/23/2014   Normal ferritin, low transferrin saturation, "scant" bone marrow iron stores, review of peripheral blood smear consistent with iron deficiency anemia   colonoscopy and upper endoscopy 09/23/2014 3.  Exertional dyspnea /orthopnea-most likely secondary to congestive heart failure  4. History of B-12 deficiency  5. Rash over the trunk and extremities 08/18/2014. Appears consistent with a drug rash. Resolved 08/28/2014.  Rash 11/18/2014, likely secondary to a contrast allergy , diagnostic contrast will be listed as an allergy 6. Colonoscopy 06/03/2008. Medium sized internal hemorrhoids.  7. Admission with atrial flutter with a rapid ventricular response 10/15/2014-status post cardioversion 10/16/2014, maintained on  apixaban  Repeat cardioversion 11/30/2014  8. Right olecranon skin lesion-likely a lipoma or cyst 9. 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 She will return for an office visit in 4 months.  [name], MD  12/06/2015  12:45 PM

## 2015-12-12 ENCOUNTER — Encounter: Payer: Self-pay | Admitting: Family Medicine

## 2016-01-19 ENCOUNTER — Other Ambulatory Visit: Payer: Self-pay | Admitting: Nurse Practitioner

## 2016-01-31 ENCOUNTER — Ambulatory Visit (HOSPITAL_COMMUNITY)
Admission: RE | Admit: 2016-01-31 | Discharge: 2016-01-31 | Disposition: A | Discharge status: Home / Self Care | Payer: PPO | Source: Ambulatory Visit | Attending: Nurse Practitioner | Admitting: Nurse Practitioner

## 2016-01-31 ENCOUNTER — Encounter (HOSPITAL_COMMUNITY): Payer: Self-pay | Admitting: Nurse Practitioner

## 2016-01-31 VITALS — BP 140/82 | HR 51 | Ht 66.0 in | Wt 192.0 lb

## 2016-01-31 DIAGNOSIS — F419 Anxiety disorder, unspecified: Secondary | ICD-10-CM | POA: Insufficient documentation

## 2016-01-31 DIAGNOSIS — E039 Hypothyroidism, unspecified: Secondary | ICD-10-CM | POA: Insufficient documentation

## 2016-01-31 DIAGNOSIS — J449 Chronic obstructive pulmonary disease, unspecified: Secondary | ICD-10-CM | POA: Insufficient documentation

## 2016-01-31 DIAGNOSIS — D509 Iron deficiency anemia, unspecified: Secondary | ICD-10-CM | POA: Diagnosis not present

## 2016-01-31 DIAGNOSIS — I4891 Unspecified atrial fibrillation: Secondary | ICD-10-CM | POA: Insufficient documentation

## 2016-01-31 DIAGNOSIS — E559 Vitamin D deficiency, unspecified: Secondary | ICD-10-CM | POA: Insufficient documentation

## 2016-01-31 DIAGNOSIS — R0609 Other forms of dyspnea: Secondary | ICD-10-CM | POA: Insufficient documentation

## 2016-01-31 DIAGNOSIS — N183 Chronic kidney disease, stage 3 (moderate): Secondary | ICD-10-CM | POA: Diagnosis not present

## 2016-01-31 DIAGNOSIS — R001 Bradycardia, unspecified: Secondary | ICD-10-CM | POA: Diagnosis not present

## 2016-01-31 DIAGNOSIS — E785 Hyperlipidemia, unspecified: Secondary | ICD-10-CM | POA: Diagnosis not present

## 2016-01-31 DIAGNOSIS — I5043 Acute on chronic combined systolic (congestive) and diastolic (congestive) heart failure: Secondary | ICD-10-CM | POA: Insufficient documentation

## 2016-01-31 DIAGNOSIS — I130 Hypertensive heart and chronic kidney disease with heart failure and stage 1 through stage 4 chronic kidney disease, or unspecified chronic kidney disease: Secondary | ICD-10-CM | POA: Insufficient documentation

## 2016-01-31 DIAGNOSIS — C8510 Unspecified B-cell lymphoma, unspecified site: Secondary | ICD-10-CM | POA: Insufficient documentation

## 2016-01-31 DIAGNOSIS — D61810 Antineoplastic chemotherapy induced pancytopenia: Secondary | ICD-10-CM | POA: Diagnosis not present

## 2016-01-31 DIAGNOSIS — Z79899 Other long term (current) drug therapy: Secondary | ICD-10-CM | POA: Diagnosis not present

## 2016-01-31 LAB — COMPREHENSIVE METABOLIC PANEL
ALT: 17 U/L (ref 14–54)
AST: 20 U/L (ref 15–41)
Albumin: 3.8 g/dL (ref 3.5–5.0)
Alkaline Phosphatase: 60 U/L (ref 38–126)
Anion gap: 9 (ref 5–15)
BUN: 21 mg/dL — ABNORMAL HIGH (ref 6–20)
CO2: 32 mmol/L (ref 22–32)
Calcium: 9.1 mg/dL (ref 8.9–10.3)
Chloride: 100 mmol/L — ABNORMAL LOW (ref 101–111)
Creatinine, Ser: 1.14 mg/dL — ABNORMAL HIGH (ref 0.44–1.00)
GFR calc Af Amer: 51 mL/min — ABNORMAL LOW (ref >60)
GFR calc non Af Amer: 44 mL/min — ABNORMAL LOW (ref >60)
Glucose, Bld: 115 mg/dL — ABNORMAL HIGH (ref 65–99)
Potassium: 4.1 mmol/L (ref 3.5–5.1)
Sodium: 141 mmol/L (ref 135–145)
Total Bilirubin: 0.7 mg/dL (ref 0.3–1.2)
Total Protein: 6.6 g/dL (ref 6.5–8.1)

## 2016-01-31 LAB — TSH: TSH: 3.694 u[IU]/mL (ref 0.350–4.500)

## 2016-01-31 NOTE — Progress Notes (Signed)
Patient ID: Amanda Sellers, female   DOB: 1932-04-28, 83 y.o.   MRN: [id number]           Primary Care Physician: Tammi Sou, MD Referring Physician: Dr. Ronni Rumble Amanda Sellers is a 83 y.o. female with a h/o  diastolic heart failure, afib, newly diagnosed with recent dx of lymphoma. She was admitted to [facility] 9/22 on IV dilt and given diuretics for acute on chronic systolic HF. It was arranged for to undergo TEE DCCV.  She had exertional dyspnea with PND & orthopnea.She felt very tired & had little energy. She  noted a few palpitations, but does not recall rapid irregular heart rates/rhythms.   She had TEE without thrombus and underwent DCCV that was successful with one shock of 120J. She did well post procedure and was seen by Dr. Jerilynn Mages. Croitoru and found stable for discharge. She was negative 2700cc since admit and weight was down 4 lbs with IV diuretics. D/C wt is 168.  Plan on d/c was for  uninterrupted anticoagulation for next 30 days, preferably lifelong barring bleeding complications. AFib clinic follow up in 1-2 weeks. Outpatient [facility], non-urgent. ASA stopped.   She was seen in the afib clinic, 10/10 and found to be in afib with rvr. She went into afib short time after cardioversion. EKG showed afib at 130-140 bpm. She was  not on any drugs for rate control.  Is taking DOAC without fail. Continues with chemotherapy for lymphoma.   She was started on BB and  returned to clinic 10/12 with better v rates. 80's with sitting and 90-100's with activity. She does not have any PND/orthopnea although she has slept better in her recliner for months now.No lower extremity edema. Her weight is stable at home.  She has  finished chemotherapy for non- hodgkin's lymphoma and had f/u Pet scan 10/26,  and CA appears to be in remission, so per pt, no more chemo for right now. She has been on DOAC now x one month so will start attempts to restore SR. Discussed options  amiodarone or tikosyn and she would prefer amiodarone as she has two neighbors on amiodarone and they are both in rhythm and tolerating drug well. She is not interested in a hospital stay for drug loading. She reports rash with recent contrast dye. Amiodarone was started 200 mg bid, to then go down to 200 mg a day and then would discuss cardioversion.  She asked to be seen 11/2,  in the afib clinic for increased shortness of breath yesterday and last night. She has slept in the recliner for months but did not feel as well last night and slept poorly, feeling more short of breath and fullness in her chest.She has noticed increased pedal edema. She did not take her amiodarone last night or this am. Ekg shows rate controlled afib at 82 bpm, qtc at 464 ms.PO on RA is 98%. Weight is up 2 lbs from last visit, 6 lbs since 10/17. Lasix dose was decreased to 40 mg daily from 40 mg am and 20 mg pm when seen by Dr. Ellyn Hack 10/22.. Pt has noticed weight climbing for several days. She is fairly comfortable when quiet and sitting.   She was asked to double lasix to 80 mg  x 3 days, and to reduce amiodarone 200 mg a day. She had lost 3 pounds on return to clinic several days later. Clinically, she is much improved with more energy and less shortness of breath.  Able to sleep well. Had enough energy to walk into clinic today without wheelchair and felt well enough to clean her house yesterday. Ideally,I would like to load amiodarone a while longer but will go ahead and  pursue DCCV sooner than later, as it is contributing to her heart failure. Compliant with blood thinner without missed doses On recent labs, TSH was elevated but daughter explained that she just started taking replacement correctly on an empty stomach, and sees PCP 11/16 and she expects him to adjust the dose.  11/14-  Returns to afib clinic with a smile on her face and reporting all the activities she has been doing over the last week, having returned to  sinus rhythm after cardioversion. Much more energy and breathing is much improved.  Fluid status is stable.  12/13- Returns to afib clinic for f/u and reports doing well No afib, no further shortness of breath or edema. She is back to her usual activities. Got a good report form the Ca unit recently. Tolerating amiodarone 200 mg a day.  Return to afib clinic 4/18, reports that she is staying in [place]. Saw pulmonology yesterday for some c/o of shortness of breath but breathing much improved since staying in SR. Xray no active process, thyroid, liver studies drawn yesterday, and normal. MD said he would call hr after results of tests were reviewed. Fluid status normal.  F/u in afib clinic 01/31/16, pt continues to do well without any afib. Her fluid status is normal. Last TSH, 3 months ago, showed mild increase and will check TSH and liver panel today. Her breathing is good other than the last week in the bitter cold weather , she does not breath as good outside..No bleeding issues with eliquis.  Today, she denies symptoms of palpitations, chest pain,  orthopnea, PND, mild lower extremity edema, no dizziness, presyncope, syncope, or neurologic sequela. Fatigue and mild dyspnea. The patient is tolerating medications without difficulties and is otherwise without complaint today.   Past Medical History:  Diagnosis Date  . Anticoagulation adequate, Eliquis 10/16/2014  . Anxiety    pt denies  . Atrial fibrillation ([place])    With RVR; elec cardioversion 11/30/14.  On Amio since 10/2014; Dr. Lenna Gilford following her from pulm standpoint regarding this med.  . Chronic diastolic congestive heart failure ([place]) 07/86/7544   Complicated by atrial fibrillation; Echo 07/23/14: mild LVH, EF 50-55%, grade 2 diast dysfxn  . Chronic renal insufficiency, stage III (moderate)    GFR @ 50  . COPD (chronic obstructive pulmonary disease) ([place])    recently noted on Xray, does not have any problems  . Family history of adverse  reaction to anesthesia    daughter has difficulty waking up  . Follicular lymphoma ([place])    Non Hodgkins B cell lymphoma; s/p chemo summer 2016--in remission as of 11/2015.  Marland Kitchen HLD (hyperlipidemia)     06/2014  . HTN (hypertension)   . Hypothyroidism   . Lip cancer    Dr. Janace Hoard excised this: invasive SCC--no sign of cancer at ENT f/u 10/2015  . Microcytic anemia    transfused 3 U total in hosp 07/2014  . Olecranon bursitis of right elbow 02/2015  . Pancytopenia due to antineoplastic chemotherapy ([place])   . Positive occult stool blood test 08/08/14   Endoscopies ok 08/2014  . Pulmonary metastases ([place]) 07/24/2014  . Shortness of breath dyspnea    with exertion  . Vitamin B 12 deficiency   . Vitamin D deficiency  Past Surgical History:  Procedure Laterality Date  . ABDOMINAL HYSTERECTOMY  1978  . BONE MARROW BIOPSY  07/24/14  . BREAST BIOPSY    . CARDIOVERSION N/A 10/16/2014   Procedure: CARDIOVERSION;  Surgeon: Lelon Perla, MD;  Location: [facility];  Service: Cardiovascular;  Laterality: N/A;  . CARDIOVERSION N/A 11/30/2014   Procedure: CARDIOVERSION;  Surgeon: Larey Dresser, MD;  Location: [facility]COPY;  Service: Cardiovascular;  Laterality: N/A;  . COLONOSCOPY  09/23/14   small hemorrhoids, otherwise normal (performed for IDA and heme+ stool)  . COLONOSCOPY    . MASS EXCISION Left 07/15/2015   Left lower lip mass--invasive SCC w/negative margins.  Procedure: EXCISION MASS;  Surgeon: Melissa Montane, MD;  Location: [place];  Service: ENT;  Laterality: Left;  Wedge excision left lower lip mass  . NM MYOVIEW LTD  10/29/2014   medium size mild surgery defect in the mid anterior and apical anterior location suggestive of breast attenuation. No reversibility. LOW RISK  . PFTs  02/2015   Restriction with diffusion defect: cardiologist referred her to pulm to help interpret PFTs and decide whether she has amiodarone toxicity  . TEE WITHOUT CARDIOVERSION N/A 10/16/2014   Procedure:  TRANSESOPHAGEAL ECHOCARDIOGRAM (TEE);  Surgeon: Lelon Perla, MD;  Location: [facility] END[facility]COPY;  Service: Cardiovascular;  Laterality: N/A;  . TRANSTHORACIC ECHOCARDIOGRAM  07/23/14   mild LVH, EF 50-55%, grade 2 diast dysfxn  . UPPER GI ENDOSCOPY  09/23/2014   small hiatus hernia, nodules in stomach biopsied (chronic active erosive atrophic gastritis with intestinal metaplasia--no dysplasia or malignancy) otherwise normal    Current Outpatient Prescriptions  Medication Sig Dispense Refill  . ALPRAZolam (XANAX) 0.5 MG tablet 1 tab po qhs prn anxiety-related insomnia 90 tablet 1  . amiodarone (PACERONE) 200 MG tablet Take 1 tablet (200 mg total) by mouth daily. 180 tablet 3  . apixaban (ELIQUIS) 5 MG TABS tablet Take 1 tablet (5 mg total) by mouth 2 (two) times daily. 180 tablet 3  . cyanocobalamin (,VITAMIN B-12,) 1000 MCG/ML injection Inject 1 mL (1,000 mcg total) into the skin every 30 (thirty) days. 1 mL 6  . ergocalciferol (VITAMIN D2) 50000 UNITS capsule Take 50,000 Units by mouth every Sunday.     . ferrous sulfate 325 (65 FE) MG tablet Take 1 tablet (325 mg total) by mouth 2 (two) times daily with a meal. 30 tablet 1  . fish oil-omega-3 fatty acids 1000 MG capsule Take 2 g by mouth daily.    . furosemide (LASIX) 40 MG tablet Take 1 tablet (40 mg total) by mouth 2 (two) times daily. 60 tablet 6  . levothyroxine (SYNTHROID, LEVOTHROID) 100 MCG tablet Take 1 tablet (100 mcg total) by mouth daily before breakfast. 90 tablet 3  . metoprolol succinate (TOPROL-XL) 25 MG 24 hr tablet Take 25 mg by mouth 2 (two) times daily.    . OXYGEN Inhale into the lungs. 1Lt at bedtime    . potassium chloride 20 MEQ TBCR Take 20 mEq by mouth daily. 30 tablet 3  . pravastatin (PRAVACHOL) 80 MG tablet Take 1 tablet (80 mg total) by mouth daily. 90 tablet 3  . prochlorperazine (COMPAZINE) 10 MG tablet Take 10 mg by mouth every 8 (eight) hours as needed for nausea or vomiting. Reported on 08/05/2015     No  current facility-administered medications for this encounter.     Allergies  Allergen Reactions  . Iodinated Diagnostic Agents Rash and Other (See Comments)    CT Contrast  Social History   Social History  . Marital status: Married    Spouse name: N/A  . Number of children: 8  . Years of education: N/A   Occupational History  . retired    Social History Main Topics  . Smoking status: Never Smoker  . Smokeless tobacco: Never Used  . Alcohol use No  . Drug use: No  . Sexual activity: Not on file   Other Topics Concern  . Not on file   Social History Narrative   Widowed, has 8 children.   Orig from [place], now lives in [place].   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   No tob/alc/drugs.    Family History  Problem Relation Age of Onset  . Melanoma Mother   . Stomach cancer Father   . Lymphoma Sister   . Diabetes Daughter     ROS- All systems are reviewed and negative except as per the HPI above  Physical Exam: Vitals:   01/31/16 0927  BP: 140/82  Pulse: (!) 51  Weight: 192 lb (87.1 kg)  Height: '5\' 6"'  (1.676 m)    GEN- The patient is well appearing, alert and oriented x 3 today.   Head- normocephalic, atraumatic Eyes-  Sclera clear, conjunctiva pink Ears- hearing intact Oropharynx- clear Neck- supple, no JVP Lymph- no cervical lymphadenopathy Lungs- Clear to ausculation bilaterally, normal work of breathing Heart- Regular rate and rhythm, no murmurs, rubs or gallops, PMI not laterally displaced GI- soft, NT, ND, + BS Extremities- no clubbing, cyanosis,  1+ edema, improved from recent 2+ edema MS- no significant deformity or atrophy Skin- no rash or lesion Psych- euthymic mood, full affect Neuro- strength and sensation are intact  EKG- Sinus brady,, with v rate 51 bpm, pr int 120m, qrs int 94 ms, qtc 477 ms Epic records reviewed. Labs reviewed  Assessment and Plan: 1. Persistent symptomatic  afib Maintaining SR Continue amiodarone 200 mg qd Continue metoprolol  25 mg bid   Continue apixaban for chadsvasc score of at least 4  TSH/comprenhensive panel today  2. Non Hodgkns B cell lymphoma S/p chemotherapy Stable  3. Diastolic heart failure Continue Lasix 40 mg bid Fluid status stable Symptoms improved with return to SR  4.Dyspnea  Improved in SR but worse in the last week due to very low temperatures  F/u with Dr. HEllyn Hackin June Afib clinic in Dec/Jan or as needed  [place] Angeline Trick, AS[facility] [phone]

## 2016-02-03 ENCOUNTER — Other Ambulatory Visit (HOSPITAL_COMMUNITY): Payer: Self-pay | Admitting: *Deleted

## 2016-02-03 MED ORDER — APIXABAN 5 MG PO TABS: 5.0000 mg | ORAL_TABLET | Freq: Two times a day (BID) | ORAL | 3 refills | Status: DC

## 2016-02-07 ENCOUNTER — Other Ambulatory Visit: Payer: Self-pay | Admitting: Family Medicine

## 2016-02-07 ENCOUNTER — Ambulatory Visit (INDEPENDENT_AMBULATORY_CARE_PROVIDER_SITE_OTHER): Payer: PPO | Admitting: Family Medicine

## 2016-02-07 ENCOUNTER — Encounter: Payer: Self-pay | Admitting: Family Medicine

## 2016-02-07 VITALS — BP 148/73 | HR 59 | Temp 98.1°F | Resp 16 | Ht 66.0 in | Wt 190.5 lb

## 2016-02-07 DIAGNOSIS — E039 Hypothyroidism, unspecified: Secondary | ICD-10-CM

## 2016-02-07 DIAGNOSIS — I10 Essential (primary) hypertension: Secondary | ICD-10-CM

## 2016-02-07 DIAGNOSIS — E7800 Pure hypercholesterolemia, unspecified: Secondary | ICD-10-CM

## 2016-02-07 DIAGNOSIS — I480 Paroxysmal atrial fibrillation: Secondary | ICD-10-CM

## 2016-02-07 DIAGNOSIS — Z23 Encounter for immunization: Secondary | ICD-10-CM | POA: Diagnosis not present

## 2016-02-07 DIAGNOSIS — E559 Vitamin D deficiency, unspecified: Secondary | ICD-10-CM

## 2016-02-07 DIAGNOSIS — Z0000 Encounter for general adult medical examination without abnormal findings: Secondary | ICD-10-CM | POA: Diagnosis not present

## 2016-02-07 DIAGNOSIS — I5032 Chronic diastolic (congestive) heart failure: Secondary | ICD-10-CM

## 2016-02-07 LAB — LIPID PANEL
Cholesterol: 232 mg/dL — ABNORMAL HIGH (ref 0–200)
HDL: 71.5 mg/dL (ref >39.00)
NonHDL: 160.54
Total CHOL/HDL Ratio: 3
Triglycerides: 224 mg/dL — ABNORMAL HIGH (ref 0.0–149.0)
VLDL: 44.8 mg/dL — ABNORMAL HIGH (ref 0.0–40.0)

## 2016-02-07 LAB — LDL CHOLESTEROL, DIRECT: Direct LDL: 110 mg/dL

## 2016-02-07 LAB — VITAMIN D 25 HYDROXY (VIT D DEFICIENCY, FRACTURES): VITD: 18.38 ng/mL — ABNORMAL LOW (ref 30.00–100.00)

## 2016-02-07 MED ORDER — ERGOCALCIFEROL 1.25 MG (50000 UT) PO CAPS: 50000.0000 [IU] | ORAL_CAPSULE | ORAL | 1 refills | Status: DC

## 2016-02-07 MED ORDER — CYANOCOBALAMIN 1000 MCG/ML IJ SOLN: 1000.0000 ug | INTRAMUSCULAR | 12 refills | Status: DC

## 2016-02-07 NOTE — Progress Notes (Signed)
Pre visit review using our clinic review tool, if applicable. No additional management support is needed unless otherwise documented below in the visit note. 

## 2016-02-07 NOTE — Progress Notes (Signed)
Subjective:   Amanda Sellers is a 83 y.o. female who presents for an Initial Medicare Annual Wellness Visit. Patient is accompanied by her daughter, Amanda Sellers.   The Patient was informed that the wellness visit is to identify future health risk and educate and initiate measures that can reduce risk for increased disease through the lifespan.   Review of Systems    No ROS.  Medicare Wellness Visit.   Cardiac Risk Factors include: advanced age (>59mn, >>63women);hypertension;dyslipidemia;family history of premature cardiovascular disease   Sleep patterns: Occasional problem staying asleep. Uses Xanax very rarely.  Home Safety/Smoke Alarms:  Smoke detectors and security in place.  Living environment; residence and Firearm Safety: Son lives with patient in a single story home. One step to enter home, uses rail.  Seat Belt Safety/Bike Helmet: Wears seat belt.   Counseling:   Eye Exam-Last exam > 5 years. Denies visual disturbances.  Dental-Full dentures, gum care discussed.   Female:   Pap-N/A       Mammo-03/14/2012, incomplete. Declines further testing.        Dexa scan-01/30/2006, osteopenia. Declines further testing. CCS-colonoscopy 09/23/2014, normal. No recall.      Objective:    Today's Vitals   02/07/16 0926  BP: (!) 148/73  Pulse: (!) 59  Resp: 16  Temp: 98.1 F (36.7 C)  TempSrc: Oral  SpO2: 97%  Weight: 190 lb 8 oz (86.4 kg)  Height: _0  (1.676 m)   Body mass index is 30.75 kg/m.   Current Medications (verified) Outpatient Encounter Prescriptions as of 02/07/2016  Medication Sig  . ALPRAZolam (XANAX) 0.5 MG tablet 1 tab po qhs prn anxiety-related insomnia  . amiodarone (PACERONE) 200 MG tablet Take 1 tablet (200 mg total) by mouth daily.  .Marland Kitchenapixaban (ELIQUIS) 5 MG TABS tablet Take 1 tablet (5 mg total) by mouth 2 (two) times daily.  . cyanocobalamin (,VITAMIN B-12,) 1000 MCG/ML injection Inject 1 mL (1,000 mcg total) into the skin every 30 (thirty) days.    .Derrill MemoON 02/13/2016] ergocalciferol (VITAMIN D2) 50000 units capsule Take 1 capsule (50,000 Units total) by mouth every Sunday.  . ferrous sulfate 325 (65 FE) MG tablet Take 1 tablet (325 mg total) by mouth 2 (two) times daily with a meal.  . fish oil-omega-3 fatty acids 1000 MG capsule Take 2 g by mouth daily.  . furosemide (LASIX) 40 MG tablet Take 1 tablet (40 mg total) by mouth 2 (two) times daily.  .Marland Kitchenlevothyroxine (SYNTHROID, LEVOTHROID) 100 MCG tablet Take 1 tablet (100 mcg total) by mouth daily before breakfast.  . metoprolol succinate (TOPROL-XL) 25 MG 24 hr tablet Take 25 mg by mouth 2 (two) times daily.  . OXYGEN Inhale into the lungs. 1Lt at bedtime  . potassium chloride 20 MEQ TBCR Take 20 mEq by mouth daily.  . pravastatin (PRAVACHOL) 80 MG tablet Take 1 tablet (80 mg total) by mouth daily.  . prochlorperazine (COMPAZINE) 10 MG tablet Take 10 mg by mouth every 8 (eight) hours as needed for nausea or vomiting. Reported on 08/05/2015  . [DISCONTINUED] cyanocobalamin (,VITAMIN B-12,) 1000 MCG/ML injection Inject 1 mL (1,000 mcg total) into the skin every 30 (thirty) days.  . [DISCONTINUED] ergocalciferol (VITAMIN D2) 50000 UNITS capsule Take 50,000 Units by mouth every Sunday.    No facility-administered encounter medications on file as of 02/07/2016.     Allergies (verified) Iodinated diagnostic agents   History: Past Medical History:  Diagnosis Date  . Anticoagulation adequate, Eliquis 10/16/2014  .  Anxiety    pt denies  . Atrial fibrillation ([place])    With RVR; elec cardioversion 11/30/14.  On Amio since 10/2014; Dr. Lenna Gilford following her from pulm standpoint regarding this med.  . Chronic diastolic congestive heart failure ([place]) 66/06/3014   Complicated by atrial fibrillation; Echo 07/23/14: mild LVH, EF 50-55%, grade 2 diast dysfxn  . Chronic renal insufficiency, stage III (moderate)    GFR @ 50  . COPD (chronic obstructive pulmonary disease) ([name])    recently noted on  Xray, does not have any problems  . Family history of adverse reaction to anesthesia    daughter has difficulty waking up  . Follicular lymphoma ([place])    Non Hodgkins B cell lymphoma; s/p chemo summer 2016--in remission as of 11/2015.  Marland Kitchen HLD (hyperlipidemia)     06/2014  . HTN (hypertension)   . Hypothyroidism   . Lip cancer    Dr. Janace Hoard excised this: invasive SCC--no sign of cancer at ENT f/u 10/2015  . Microcytic anemia    transfused 3 U total in hosp 07/2014  . Olecranon bursitis of right elbow 02/2015  . Pancytopenia due to antineoplastic chemotherapy ([place])   . Positive occult stool blood test 08/08/14   Endoscopies ok 08/2014  . Pulmonary metastases ([place]) 07/24/2014  . Shortness of breath dyspnea    with exertion  . Vitamin B 12 deficiency   . Vitamin D deficiency    Past Surgical History:  Procedure Laterality Date  . ABDOMINAL HYSTERECTOMY  1978  . BONE MARROW BIOPSY  07/24/14  . BREAST BIOPSY    . CARDIOVERSION N/A 10/16/2014   Procedure: CARDIOVERSION;  Surgeon: Lelon Perla, MD;  Location: [facility] ENDOSCOPY;  Service: Cardiovascular;  Laterality: N/A;  . CARDIOVERSION N/A 11/30/2014   Procedure: CARDIOVERSION;  Surgeon: Larey Dresser, MD;  Location: [facility] ENDOSCOPY;  Service: Cardiovascular;  Laterality: N/A;  . COLONOSCOPY  09/23/14   small hemorrhoids, otherwise normal (performed for IDA and heme+ stool)  . COLONOSCOPY    . MASS EXCISION Left 07/15/2015   Left lower lip mass--invasive SCC w/negative margins.  Procedure: EXCISION MASS;  Surgeon: Melissa Montane, MD;  Location: [place];  Service: ENT;  Laterality: Left;  Wedge excision left lower lip mass  . NM MYOVIEW LTD  10/29/2014   medium size mild surgery defect in the mid anterior and apical anterior location suggestive of breast attenuation. No reversibility. LOW RISK  . PFTs  02/2015   Restriction with diffusion defect: cardiologist referred her to pulm to help interpret PFTs and decide whether she has amiodarone toxicity  . TEE  WITHOUT CARDIOVERSION N/A 10/16/2014   Procedure: TRANSESOPHAGEAL ECHOCARDIOGRAM (TEE);  Surgeon: Lelon Perla, MD;  Location: [facility] ENDOSCOPY;  Service: Cardiovascular;  Laterality: N/A;  . TRANSTHORACIC ECHOCARDIOGRAM  07/23/14   mild LVH, EF 50-55%, grade 2 diast dysfxn  . UPPER GI ENDOSCOPY  09/23/2014   small hiatus hernia, nodules in stomach biopsied (chronic active erosive atrophic gastritis with intestinal metaplasia--no dysplasia or malignancy) otherwise normal   Family History  Problem Relation Age of Onset  . Melanoma Mother   . Stomach cancer Father   . Lymphoma Sister   . Diabetes Daughter    Social History   Occupational History  . retired    Social History Main Topics  . Smoking status: Never Smoker  . Smokeless tobacco: Never Used  . Alcohol use No  . Drug use: No  . Sexual activity: Not on file    Tobacco Counseling  Counseling given: No   Activities of Daily Living In your present state of health, do you have any difficulty performing the following activities: 02/07/2016 07/14/2015  Hearing? N Y  Vision? N N  Difficulty concentrating or making decisions? N N  Walking or climbing stairs? Y Y  Dressing or bathing? N N  Doing errands, shopping? N N  Preparing Food and eating ? N -  Using the Toilet? N -  In the past six months, have you accidently leaked urine? N -  Do you have problems with loss of bowel control? N -  Managing your Medications? N -  Managing your Finances? N -  Housekeeping or managing your Housekeeping? N -  Some recent data might be hidden    Immunizations and Health Maintenance Immunization History  Administered Date(s) Administered  . Influenza,inj,Quad PF,36+ Mos 10/27/2014, 10/04/2015  . Pneumococcal Conjugate-13 02/07/2016  . Pneumococcal-Unspecified 01/23/2014  . Tdap 12/09/2014  . Zoster 03/19/2012   Health Maintenance Due  Topic Date Due  . PNA vac Low Risk Adult (2 of 2 - PCV13) 01/24/2015    Patient Care  Team: Tammi Sou, MD as PCP - General (Family Medicine) Ladell Pier, MD as Consulting Physician (Oncology) Leonie Man, MD as Consulting Physician (Cardiology) Melissa Montane, MD as Consulting Physician (Otolaryngology) Melissa Montane, MD as Consulting Physician (Otolaryngology) Noralee Space, MD as Consulting Physician (Pulmonary Disease)  Indicate any recent Medical Services you may have received from other than [facility] providers in the past year (date may be approximate).     Assessment:   This is a routine wellness examination for [place]. Physical assessment deferred to PCP.   Hearing/Vision screen Hearing Screening Comments: Able to hear conversational tones w/o difficulty. No issues reported.    Vision Screening Comments: Wears reading glasses.   Dietary issues and exercise activities discussed: Current Exercise Habits: The patient does not participate in regular exercise at present (housework ), Exercise limited by: cardiac condition(s);respiratory conditions(s) (SOB with extensive walking)   Diet (meal preparation, eat out, water intake, caffeinated beverages, dairy products, fruits and vegetables): Drinks water.   Breakfast: boiled eggs, toast, coffee Lunch: vegetables, corn bread, [place] Dinner: meat and vegetables.   Discussed heart healthy diet. Encouraged to continue to be as active as possible.   Goals    . <enter goal here>          Would like to maintain current health status by remaining active.       Depression Screen PHQ 2/9 Scores 02/07/2016 09/09/2014  PHQ - 2 Score 0 0    Fall Risk Fall Risk  02/07/2016 10/08/2015 09/09/2014  Falls in the past year? No No No    Cognitive Function:       Ad8 score reviewed for issues:  Issues making decisions:no  Less interest in hobbies / activities:no  Repeats questions, stories (family complaining):no  Trouble using ordinary gadgets (microwave, computer, phone):no  Forgets the month or year:  no  Mismanaging finances: no  Remembering appts:no Daily problems with thinking and/or memory:no Ad8 score is=0     Screening Tests Health Maintenance  Topic Date Due  . PNA vac Low Risk Adult (2 of 2 - PCV13) 01/24/2015  . TETANUS/TDAP  12/08/2024  . INFLUENZA VACCINE  Completed  . DEXA SCAN  Completed  . ZOSTAVAX  Completed      Plan:     Eat heart healthy diet (full of fruits, vegetables, whole grains, lean protein, water--limit salt, fat, and  sugar intake) and increase physical activity as tolerated.  Continue doing brain stimulating activities (puzzles, reading, adult coloring books, staying active) to keep memory sharp.   Bring a copy of your advance directives to your next office visit.    During the course of the visit, [place] was educated and counseled about the following appropriate screening and preventive services:   Vaccines to include Pneumoccal, Influenza, Hepatitis B, Td, Zostavax, HCV  Cardiovascular disease screening  Colorectal cancer screening  Bone density screening  Diabetes screening  Glaucoma screening  Mammography/PAP  Nutrition counseling  Patient Instructions (the written plan) were given to the patient.    Gerilyn Nestle, RN   02/07/2016

## 2016-02-07 NOTE — Progress Notes (Signed)
OFFICE VISIT  02/07/2016   CC:  Chief Complaint  Patient presents with  . Follow-up    RCI, pt is fasting.    HPI:    Patient is a 83 y.o. Caucasian female who presents for 4 mo f/u HTN, CRI stage III, HLD, hypothyroidism. She has chronic diastolic CHF and atrial fibrillation followed by Dr. Ellyn Hack.  Occ home bp monitoring shows normal numbers.  Recent cardiology f/u showed her to be in sinus rhythm.  No management changes were made.  Takes levothyroxine 30 min before BF by itself.   Takes pravastatin daily. No side effects.  Feeling pretty good.  No acute complaints.  ROS: no CP, no SOB/DOE, no fevers, no cough/URI sx's, no HAs or vision changes.  No nose bleeds, no blood in urine or stool, no excessive bruising.  Past Medical History:  Diagnosis Date  . Anticoagulation adequate, Eliquis 10/16/2014  . Anxiety    pt denies  . Atrial fibrillation ([place])    With RVR; elec cardioversion 11/30/14.  On Amio since 10/2014; Dr. Lenna Gilford following her from pulm standpoint regarding this med.  . Chronic diastolic congestive heart failure ([place]) 73/42/8768   Complicated by atrial fibrillation; Echo 07/23/14: mild LVH, EF 50-55%, grade 2 diast dysfxn  . Chronic renal insufficiency, stage III (moderate)    GFR @ 50  . COPD (chronic obstructive pulmonary disease) ([place])    recently noted on Xray, does not have any problems  . Family history of adverse reaction to anesthesia    daughter has difficulty waking up  . Follicular lymphoma ([place])    Non Hodgkins B cell lymphoma; s/p chemo summer 2016--in remission as of 11/2015.  Marland Kitchen HLD (hyperlipidemia)     06/2014  . HTN (hypertension)   . Hypothyroidism   . Lip cancer    Dr. Janace Hoard excised this: invasive SCC--no sign of cancer at ENT f/u 10/2015  . Microcytic anemia    transfused 3 U total in hosp 07/2014  . Olecranon bursitis of right elbow 02/2015  . Pancytopenia due to antineoplastic chemotherapy ([place])   . Positive occult stool blood test  08/08/14   Endoscopies ok 08/2014  . Pulmonary metastases ([place]) 07/24/2014  . Shortness of breath dyspnea    with exertion  . Vitamin B 12 deficiency   . Vitamin D deficiency     Past Surgical History:  Procedure Laterality Date  . ABDOMINAL HYSTERECTOMY  1978  . BONE MARROW BIOPSY  07/24/14  . BREAST BIOPSY    . CARDIOVERSION N/A 10/16/2014   Procedure: CARDIOVERSION;  Surgeon: Lelon Perla, MD;  Location: [facility] ENDOSCOPY;  Service: Cardiovascular;  Laterality: N/A;  . CARDIOVERSION N/A 11/30/2014   Procedure: CARDIOVERSION;  Surgeon: Larey Dresser, MD;  Location: [facility] ENDOSCOPY;  Service: Cardiovascular;  Laterality: N/A;  . COLONOSCOPY  09/23/14   small hemorrhoids, otherwise normal (performed for IDA and heme+ stool)  . COLONOSCOPY    . MASS EXCISION Left 07/15/2015   Left lower lip mass--invasive SCC w/negative margins.  Procedure: EXCISION MASS;  Surgeon: Melissa Montane, MD;  Location: [place];  Service: ENT;  Laterality: Left;  Wedge excision left lower lip mass  . NM MYOVIEW LTD  10/29/2014   medium size mild surgery defect in the mid anterior and apical anterior location suggestive of breast attenuation. No reversibility. LOW RISK  . PFTs  02/2015   Restriction with diffusion defect: cardiologist referred her to pulm to help interpret PFTs and decide whether she has amiodarone toxicity  .  TEE WITHOUT CARDIOVERSION N/A 10/16/2014   Procedure: TRANSESOPHAGEAL ECHOCARDIOGRAM (TEE);  Surgeon: Lelon Perla, MD;  Location: [facility] ENDOSCOPY;  Service: Cardiovascular;  Laterality: N/A;  . TRANSTHORACIC ECHOCARDIOGRAM  07/23/14   mild LVH, EF 50-55%, grade 2 diast dysfxn  . UPPER GI ENDOSCOPY  09/23/2014   small hiatus hernia, nodules in stomach biopsied (chronic active erosive atrophic gastritis with intestinal metaplasia--no dysplasia or malignancy) otherwise normal    Outpatient Medications Prior to Visit  Medication Sig Dispense Refill  . ALPRAZolam (XANAX) 0.5 MG tablet 1 tab po qhs prn  anxiety-related insomnia 90 tablet 1  . amiodarone (PACERONE) 200 MG tablet Take 1 tablet (200 mg total) by mouth daily. 180 tablet 3  . apixaban (ELIQUIS) 5 MG TABS tablet Take 1 tablet (5 mg total) by mouth 2 (two) times daily. 180 tablet 3  . ferrous sulfate 325 (65 FE) MG tablet Take 1 tablet (325 mg total) by mouth 2 (two) times daily with a meal. 30 tablet 1  . fish oil-omega-3 fatty acids 1000 MG capsule Take 2 g by mouth daily.    . furosemide (LASIX) 40 MG tablet Take 1 tablet (40 mg total) by mouth 2 (two) times daily. 60 tablet 6  . levothyroxine (SYNTHROID, LEVOTHROID) 100 MCG tablet Take 1 tablet (100 mcg total) by mouth daily before breakfast. 90 tablet 3  . metoprolol succinate (TOPROL-XL) 25 MG 24 hr tablet Take 25 mg by mouth 2 (two) times daily.    . OXYGEN Inhale into the lungs. 1Lt at bedtime    . potassium chloride 20 MEQ TBCR Take 20 mEq by mouth daily. 30 tablet 3  . prochlorperazine (COMPAZINE) 10 MG tablet Take 10 mg by mouth every 8 (eight) hours as needed for nausea or vomiting. Reported on 08/05/2015    . cyanocobalamin (,VITAMIN B-12,) 1000 MCG/ML injection Inject 1 mL (1,000 mcg total) into the skin every 30 (thirty) days. 1 mL 6  . ergocalciferol (VITAMIN D2) 50000 UNITS capsule Take 50,000 Units by mouth every Sunday.     . pravastatin (PRAVACHOL) 80 MG tablet Take 1 tablet (80 mg total) by mouth daily. 90 tablet 3   No facility-administered medications prior to visit.     Allergies  Allergen Reactions  . Iodinated Diagnostic Agents Rash and Other (See Comments)    CT Contrast    ROS As per HPI  PE: Blood pressure (!) 148/73, pulse (!) 59, temperature 98.1 F (36.7 C), temperature source Oral, resp. rate 16, height _0  (1.676 m), weight 190 lb 8 oz (86.4 kg), SpO2 97 %. Gen: Alert, well appearing.  Patient is oriented to person, place, time, and situation. AFFECT: pleasant, lucid thought and speech. CV: RRR, no m/r/g.   LUNGS: CTA bilat, nonlabored  resps, good aeration in all lung fields. EXT: 1-2 + pitting edema, R>L  LABS:  Lab Results  Component Value Date   CHOL 150 12/09/2014   HDL 49 12/09/2014   LDLCALC 74 12/09/2014   TRIG 135 12/09/2014   CHOLHDL 3.1 12/09/2014     Chemistry      Component Value Date/Time   NA 141 01/31/2016 0945   NA 143 11/17/2014 0801   K 4.1 01/31/2016 0945   K 3.6 11/17/2014 0801   CL 100 (L) 01/31/2016 0945   CO2 32 01/31/2016 0945   CO2 30 (H) 11/17/2014 0801   BUN 21 (H) 01/31/2016 0945   BUN 16.3 11/17/2014 0801   CREATININE 1.14 (H) 01/31/2016 0945  CREATININE 0.86 12/09/2014 0934   CREATININE 0.7 11/17/2014 0801      Component Value Date/Time   CALCIUM 9.1 01/31/2016 0945   CALCIUM 8.9 11/17/2014 0801   ALKPHOS 60 01/31/2016 0945   ALKPHOS 70 11/17/2014 0801   AST 20 01/31/2016 0945   AST 32 11/17/2014 0801   ALT 17 01/31/2016 0945   ALT 30 11/17/2014 0801   BILITOT 0.7 01/31/2016 0945   BILITOT 0.55 11/17/2014 0801     Lab Results  Component Value Date   TSH 3.694 01/31/2016   Lab Results  Component Value Date   WBC 4.8 12/06/2015   HGB 11.1 (L) 12/06/2015   HCT 34.6 (L) 12/06/2015   MCV 88.3 12/06/2015   PLT 165 12/06/2015   Lab Results  Component Value Date   IRON 52 08/05/2015   TIBC 374 08/05/2015   FERRITIN 38 08/05/2015    IMPRESSION AND PLAN:  1) HTN; The current medical regimen is effective;  continue present plan and medications. Recent lytes/cr stable 01/31/16.  2) HLD: tolerating statin.  Will recheck FLP today.  Hepatic panel normal 01/31/16.  3) Hypothyroidism: takes levothyroxine correctly.  TSH 01/31/16 normal.  4) Hx of NHL: s/p chemo and doing well.  5) A-fib and chronic diastolic CHF: stable.  No med changes today.  Continue approp cardiology f/u.  6) Vit d def: pt requests RF of her high dose vit D replacement.  It has been one week since last dose. Will check vit D level today.  An After Visit Summary was printed and given to the  patient.  FOLLOW UP: Return in about 4 months (around 06/06/2016) for annual CPE (fasting).  Signed:  Crissie Sickles, MD           02/07/2016

## 2016-02-07 NOTE — Patient Instructions (Addendum)
Eat heart healthy diet (full of fruits, vegetables, whole grains, lean protein, water--limit salt, fat, and sugar intake) and increase physical activity as tolerated.  Continue doing brain stimulating activities (puzzles, reading, adult coloring books, staying active) to keep memory sharp.   Bring a copy of your advance directives to your next office visit.  Fall Prevention in the Home Introduction Falls can cause injuries. They can happen to people of all ages. There are many things you can do to make your home safe and to help prevent falls. What can I do on the outside of my home?  Regularly fix the edges of walkways and driveways and fix any cracks.  Remove anything that might make you trip as you walk through a door, such as a raised step or threshold.  Trim any bushes or trees on the path to your home.  Use bright outdoor lighting.  Clear any walking paths of anything that might make someone trip, such as rocks or tools.  Regularly check to see if handrails are loose or broken. Make sure that both sides of any steps have handrails.  Any raised decks and porches should have guardrails on the edges.  Have any leaves, snow, or ice cleared regularly.  Use sand or salt on walking paths during winter.  Clean up any spills in your garage right away. This includes oil or grease spills. What can I do in the bathroom?  Use night lights.  Install grab bars by the toilet and in the tub and shower. Do not use towel bars as grab bars.  Use non-skid mats or decals in the tub or shower.  If you need to sit down in the shower, use a plastic, non-slip stool.  Keep the floor dry. Clean up any water that spills on the floor as soon as it happens.  Remove soap buildup in the tub or shower regularly.  Attach bath mats securely with double-sided non-slip rug tape.  Do not have throw rugs and other things on the floor that can make you trip. What can I do in the bedroom?  Use night  lights.  Make sure that you have a light by your bed that is easy to reach.  Do not use any sheets or blankets that are too big for your bed. They should not hang down onto the floor.  Have a firm chair that has side arms. You can use this for support while you get dressed.  Do not have throw rugs and other things on the floor that can make you trip. What can I do in the kitchen?  Clean up any spills right away.  Avoid walking on wet floors.  Keep items that you use a lot in easy-to-reach places.  If you need to reach something above you, use a strong step stool that has a grab bar.  Keep electrical cords out of the way.  Do not use floor polish or wax that makes floors slippery. If you must use wax, use non-skid floor wax.  Do not have throw rugs and other things on the floor that can make you trip. What can I do with my stairs?  Do not leave any items on the stairs.  Make sure that there are handrails on both sides of the stairs and use them. Fix handrails that are broken or loose. Make sure that handrails are as long as the stairways.  Check any carpeting to make sure that it is firmly attached to the stairs. Fix any   carpet that is loose or worn.  Avoid having throw rugs at the top or bottom of the stairs. If you do have throw rugs, attach them to the floor with carpet tape.  Make sure that you have a light switch at the top of the stairs and the bottom of the stairs. If you do not have them, ask someone to add them for you. What else can I do to help prevent falls?  Wear shoes that:  Do not have high heels.  Have rubber bottoms.  Are comfortable and fit you well.  Are closed at the toe. Do not wear sandals.  If you use a stepladder:  Make sure that it is fully opened. Do not climb a closed stepladder.  Make sure that both sides of the stepladder are locked into place.  Ask someone to hold it for you, if possible.  Clearly mark and make sure that you can  see:  Any grab bars or handrails.  First and last steps.  Where the edge of each step is.  Use tools that help you move around (mobility aids) if they are needed. These include:  Canes.  Walkers.  Scooters.  Crutches.  Turn on the lights when you go into a dark area. Replace any light bulbs as soon as they burn out.  Set up your furniture so you have a clear path. Avoid moving your furniture around.  If any of your floors are uneven, fix them.  If there are any pets around you, be aware of where they are.  Review your medicines with your doctor. Some medicines can make you feel dizzy. This can increase your chance of falling. Ask your doctor what other things that you can do to help prevent falls. This information is not intended to replace advice given to you by your health care provider. Make sure you discuss any questions you have with your health care provider. Document Released: 11/05/2008 Document Revised: 06/17/2015 Document Reviewed: 02/13/2014  2017 [employer]  Health Maintenance, Female Introduction Adopting a healthy lifestyle and getting preventive care can go a long way to promote health and wellness. Talk with your health care provider about what schedule of regular examinations is right for you. This is a good chance for you to check in with your provider about disease prevention and staying healthy. In between checkups, there are plenty of things you can do on your own. Experts have done a lot of research about which lifestyle changes and preventive measures are most likely to keep you healthy. Ask your health care provider for more information. Weight and diet Eat a healthy diet  Be sure to include plenty of vegetables, fruits, low-fat dairy products, and lean protein.  Do not eat a lot of foods high in solid fats, added sugars, or salt.  Get regular exercise. This is one of the most important things you can do for your health.  Most adults should exercise  for at least 150 minutes each week. The exercise should increase your heart rate and make you sweat (moderate-intensity exercise).  Most adults should also do strengthening exercises at least twice a week. This is in addition to the moderate-intensity exercise. Maintain a healthy weight  Body mass index (BMI) is a measurement that can be used to identify possible weight problems. It estimates body fat based on height and weight. Your health care provider can help determine your BMI and help you achieve or maintain a healthy weight.  For females 20 years of   age and older:  A BMI below 18.5 is considered underweight.  A BMI of 18.5 to 24.9 is normal.  A BMI of 25 to 29.9 is considered overweight.  A BMI of 30 and above is considered obese. Watch levels of cholesterol and blood lipids  You should start having your blood tested for lipids and cholesterol at 81 years of age, then have this test every 5 years.  You may need to have your cholesterol levels checked more often if:  Your lipid or cholesterol levels are high.  You are older than 81 years of age.  You are at high risk for heart disease. Cancer screening Lung Cancer  Lung cancer screening is recommended for adults 55-80 years old who are at high risk for lung cancer because of a history of smoking.  A yearly low-dose CT scan of the lungs is recommended for people who:  Currently smoke.  Have quit within the past 15 years.  Have at least a 30-pack-year history of smoking. A pack year is smoking an average of one pack of cigarettes a day for 1 year.  Yearly screening should continue until it has been 15 years since you quit.  Yearly screening should stop if you develop a health problem that would prevent you from having lung cancer treatment. Breast Cancer  Practice breast self-awareness. This means understanding how your breasts normally appear and feel.  It also means doing regular breast self-exams. Let your health  care provider know about any changes, no matter how small.  If you are in your 20s or 30s, you should have a clinical breast exam (CBE) by a health care provider every 1-3 years as part of a regular health exam.  If you are 40 or older, have a CBE every year. Also consider having a breast X-ray (mammogram) every year.  If you have a family history of breast cancer, talk to your health care provider about genetic screening.  If you are at high risk for breast cancer, talk to your health care provider about having an MRI and a mammogram every year.  Breast cancer gene (BRCA) assessment is recommended for women who have family members with BRCA-related cancers. BRCA-related cancers include:  Breast.  Ovarian.  Tubal.  Peritoneal cancers.  Results of the assessment will determine the need for genetic counseling and BRCA1 and BRCA2 testing. Cervical Cancer  Your health care provider may recommend that you be screened regularly for cancer of the pelvic organs (ovaries, uterus, and vagina). This screening involves a pelvic examination, including checking for microscopic changes to the surface of your cervix (Pap test). You may be encouraged to have this screening done every 3 years, beginning at age 21.  For women ages 30-65, health care providers may recommend pelvic exams and Pap testing every 3 years, or they may recommend the Pap and pelvic exam, combined with testing for human papilloma virus (HPV), every 5 years. Some types of HPV increase your risk of cervical cancer. Testing for HPV may also be done on women of any age with unclear Pap test results.  Other health care providers may not recommend any screening for nonpregnant women who are considered low risk for pelvic cancer and who do not have symptoms. Ask your health care provider if a screening pelvic exam is right for you.  If you have had past treatment for cervical cancer or a condition that could lead to cancer, you need Pap  tests and screening for cancer for at least   least 20 years after your treatment. If Pap tests have been discontinued, your risk factors (such as having a new sexual partner) need to be reassessed to determine if screening should resume. Some women have medical problems that increase the chance of getting cervical cancer. In these cases, your health care provider may recommend more frequent screening and Pap tests. Colorectal Cancer  This type of cancer can be detected and often prevented.  Routine colorectal cancer screening usually begins at 81 years of age and continues through 81 years of age.  Your health care provider may recommend screening at an earlier age if you have risk factors for colon cancer.  Your health care provider may also recommend using home test kits to check for hidden blood in the stool.  A small camera at the end of a tube can be used to examine your colon directly (sigmoidoscopy or colonoscopy). This is done to check for the earliest forms of colorectal cancer.  Routine screening usually begins at age 30.  Direct examination of the colon should be repeated every 5-10 years through 81 years of age. However, you may need to be screened more often if early forms of precancerous polyps or small growths are found. Skin Cancer  Check your skin from head to toe regularly.  Tell your health care provider about any new moles or changes in moles, especially if there is a change in a mole's shape or color.  Also tell your health care provider if you have a mole that is larger than the size of a pencil eraser.  Always use sunscreen. Apply sunscreen liberally and repeatedly throughout the day.  Protect yourself by wearing long sleeves, pants, a wide-brimmed hat, and sunglasses whenever you are outside. Heart disease, diabetes, and high blood pressure  High blood pressure causes heart disease and increases the risk of stroke. High blood pressure is more likely to develop  in:  People who have blood pressure in the high end of the normal range (130-139/85-89 mm Hg).  People who are overweight or obese.  People who are African American.  If you are 15-89 years of age, have your blood pressure checked every 3-5 years. If you are 77 years of age or older, have your blood pressure checked every year. You should have your blood pressure measured twice-once when you are at a hospital or clinic, and once when you are not at a hospital or clinic. Record the average of the two measurements. To check your blood pressure when you are not at a hospital or clinic, you can use:  An automated blood pressure machine at a pharmacy.  A home blood pressure monitor.  If you are between 65 years and 65 years old, ask your health care provider if you should take aspirin to prevent strokes.  Have regular diabetes screenings. This involves taking a blood sample to check your fasting blood sugar level.  If you are at a normal weight and have a low risk for diabetes, have this test once every three years after 81 years of age.  If you are overweight and have a high risk for diabetes, consider being tested at a younger age or more often. Preventing infection Hepatitis B  If you have a higher risk for hepatitis B, you should be screened for this virus. You are considered at high risk for hepatitis B if:  You were born in a country where hepatitis B is common. Ask your health care provider which countries are considered high  risk.  Your parents were born in a high-risk country, and you have not been immunized against hepatitis B (hepatitis B vaccine).  You have HIV or AIDS.  You use needles to inject street drugs.  You live with someone who has hepatitis B.  You have had sex with someone who has hepatitis B.  You get hemodialysis treatment.  You take certain medicines for conditions, including cancer, organ transplantation, and autoimmune conditions. Hepatitis C  Blood  testing is recommended for:  Everyone born from [birth year] through 1963.  Anyone with known risk factors for hepatitis C. Sexually transmitted infections (STIs)  You should be screened for sexually transmitted infections (STIs) including gonorrhea and chlamydia if:  You are sexually active and are younger than 81 years of age.  You are older than 81 years of age and your health care provider tells you that you are at risk for this type of infection.  Your sexual activity has changed since you were last screened and you are at an increased risk for chlamydia or gonorrhea. Ask your health care provider if you are at risk.  If you do not have HIV, but are at risk, it may be recommended that you take a prescription medicine daily to prevent HIV infection. This is called pre-exposure prophylaxis (PrEP). You are considered at risk if:  You are sexually active and do not regularly use condoms or know the HIV status of your partner(s).  You take drugs by injection.  You are sexually active with a partner who has HIV. Talk with your health care provider about whether you are at high risk of being infected with HIV. If you choose to begin PrEP, you should first be tested for HIV. You should then be tested every 3 months for as long as you are taking PrEP. Pregnancy  If you are premenopausal and you may become pregnant, ask your health care provider about preconception counseling.  If you may become pregnant, take 400 to 800 micrograms (mcg) of folic acid every day.  If you want to prevent pregnancy, talk to your health care provider about birth control (contraception). Osteoporosis and menopause  Osteoporosis is a disease in which the bones lose minerals and strength with aging. This can result in serious bone fractures. Your risk for osteoporosis can be identified using a bone density scan.  If you are 53 years of age or older, or if you are at risk for osteoporosis and fractures, ask your health  care provider if you should be screened.  Ask your health care provider whether you should take a calcium or vitamin D supplement to lower your risk for osteoporosis.  Menopause may have certain physical symptoms and risks.  Hormone replacement therapy may reduce some of these symptoms and risks. Talk to your health care provider about whether hormone replacement therapy is right for you. Follow these instructions at home:  Schedule regular health, dental, and eye exams.  Stay current with your immunizations.  Do not use any tobacco products including cigarettes, chewing tobacco, or electronic cigarettes.  If you are pregnant, do not drink alcohol.  If you are breastfeeding, limit how much and how often you drink alcohol.  Limit alcohol intake to no more than 1 drink per day for nonpregnant women. One drink equals 12 ounces of beer, 5 ounces of wine, or 1 ounces of hard liquor.  Do not use street drugs.  Do not share needles.  Ask your health care provider for help if you need  information about quitting drugs.  Tell your health care provider if you often feel depressed.  Tell your health care provider if you have ever been abused or do not feel safe at home. This information is not intended to replace advice given to you by your health care provider. Make sure you discuss any questions you have with your health care provider. Document Released: 07/25/2010 Document Revised: 06/17/2015 Document Reviewed: 10/13/2014  2017 [employer]  

## 2016-02-07 NOTE — Progress Notes (Signed)
Reviewed AWV and agree.  Signed:  Crissie Sickles, MD           02/07/2016

## 2016-03-28 ENCOUNTER — Other Ambulatory Visit: Payer: Self-pay | Admitting: Cardiology

## 2016-04-03 ENCOUNTER — Telehealth: Payer: Self-pay | Admitting: *Deleted

## 2016-04-03 ENCOUNTER — Ambulatory Visit: Payer: PPO | Admitting: Oncology

## 2016-04-03 ENCOUNTER — Ambulatory Visit: Payer: PPO | Admitting: Pulmonary Disease

## 2016-04-03 ENCOUNTER — Other Ambulatory Visit: Payer: PPO

## 2016-04-03 NOTE — Telephone Encounter (Signed)
Message from [facility] call service: Pt called to cancel today's appt. Message to schedulers to contact pt with new appt. Left message on voicemail for pt to expect call.

## 2016-04-04 ENCOUNTER — Telehealth: Payer: Self-pay | Admitting: Oncology

## 2016-04-04 NOTE — Telephone Encounter (Signed)
Per 3/12 schedule message lab/fu moved to 3/30 due to weather. left message for patient and mailed schedule.

## 2016-04-10 ENCOUNTER — Ambulatory Visit (INDEPENDENT_AMBULATORY_CARE_PROVIDER_SITE_OTHER)
Admission: RE | Admit: 2016-04-10 | Discharge: 2016-04-10 | Disposition: A | Discharge status: Home / Self Care | Payer: PPO | Source: Ambulatory Visit | Attending: Pulmonary Disease | Admitting: Pulmonary Disease

## 2016-04-10 ENCOUNTER — Ambulatory Visit (INDEPENDENT_AMBULATORY_CARE_PROVIDER_SITE_OTHER): Payer: PPO | Admitting: Pulmonary Disease

## 2016-04-10 VITALS — BP 122/60 | HR 68 | Temp 96.8°F | Ht 66.0 in | Wt 197.1 lb

## 2016-04-10 DIAGNOSIS — I10 Essential (primary) hypertension: Secondary | ICD-10-CM

## 2016-04-10 DIAGNOSIS — I480 Paroxysmal atrial fibrillation: Secondary | ICD-10-CM

## 2016-04-10 DIAGNOSIS — C8200 Follicular lymphoma grade I, unspecified site: Secondary | ICD-10-CM

## 2016-04-10 DIAGNOSIS — R0600 Dyspnea, unspecified: Secondary | ICD-10-CM | POA: Diagnosis not present

## 2016-04-10 DIAGNOSIS — I5032 Chronic diastolic (congestive) heart failure: Secondary | ICD-10-CM

## 2016-04-10 DIAGNOSIS — D6489 Other specified anemias: Secondary | ICD-10-CM | POA: Diagnosis not present

## 2016-04-10 DIAGNOSIS — R942 Abnormal results of pulmonary function studies: Secondary | ICD-10-CM | POA: Diagnosis not present

## 2016-04-10 MED ORDER — CLONAZEPAM 0.5 MG PO TABS
ORAL_TABLET | ORAL | 5 refills | Status: DC
Start: 2016-04-10 — End: 2016-11-06

## 2016-04-10 MED ORDER — CLONAZEPAM 0.5 MG PO TABS: ORAL_TABLET | ORAL | 5 refills | Status: DC

## 2016-04-10 NOTE — Patient Instructions (Signed)
Today we updated your med list in our EPIC system...    Continue your current medications the same...  Today we checked a follow up CXR & an ambulatory oximetry test...     We will contact you w/ the results when available...   We decided to try to help your shortness of breath sensation by trying to relax the chest wall muscles involved w/ breathing & getting a good deep breath-- try the new KLONOPIN 0.5mg  tabs taking 1/2 to 1 tab twice daily on a regular basis... Call for any questions...  Let's plan a follow up visit to check your response in about 68mo.Marland KitchenMarland Kitchen

## 2016-04-11 ENCOUNTER — Encounter: Payer: Self-pay | Admitting: Pulmonary Disease

## 2016-04-11 NOTE — Progress Notes (Signed)
Subjective:     Patient ID: Amanda Sellers, female   DOB: [birth year], 83 y.o.   MRN: [id number]  HPI    83 y/o WF, referred by DrHarding due to an abnormal PFT revealing a low DLCO, pt is on amiodarone for AFib...   ~  May 10, 2015:  Initial Pulmonary consultation by SN>  She is here today w/ her daughter Amanda Sellers, an RN from the ICU at [facility];  Her PCP is DrPMcGowen in [place]...    83 y/o WF referred by DrHarding for a decr DLCO on Amiodarone for AFib... She notes DOE w/ exertion but states that it's getting better w/ a walking regimen & feels that she is getting stronger;  She has a hx of a follicular lymphoma and had chemoRx from DrSherrill (his note of 03/29/15 is reviewed)- diagnosed 06/2014 & treated w/ 4 cycles of bendamustine/ rituximab w/ f/u CT showing signif improvement in adenopathy, lung nodules and renal lesions;  NOTE> she was placed on O2 for nocturnal use by DrSherrill during her ChemoRx, uses it prn only but doesn't want to give it up!... She saw DrHarding last 01/2015 for f/u AFib & CHF- on Amiodarone since 11/2014;  NOTE> she states her breathing is much better since her successful 2nd DCCV=> NSR...  2DEcho 07/22/14:  Mild LVH, borderline LVF w/ EF=50-55%, no regional wall motion abn, Gr2DD, trivAI, calcif mitral annulus, mod LAdil at 39m, PAsys=369mg  Myoview 10/29/2014:  There is a medium defect of mild severity present in the mid anterior and apical anterior location. This is a low risk study. Breast attenuation artifact Study would not gate secondary to afib and PVCs.  TEE 10/16/2014: Normal LV function (EF 55-60%); biatrial enlargment; spontaneous contrast in LAA but no thrombus; mild AI; moderate MR (2+); mild TR.  Smoking Hx>  She is a never smoker.  Pulmonary Hx>  No prev hx of lung dis- denies hx asthma, no prev bouts of bronchitis, never had pneumonia, no hx TB or known exposure.    Cardiac Hx>  Adm 09/2014 w/ AFlutter & rvr, s/p cardioversion x2 (last 11/2014); HBP,  Chronic diastolic CHF, Valvular dis w/ modMR & mild AI  Medical Hx>  Hx non-Hodgkins Lymphoma 06/2014 treated w/ bendamustine/ rituximab by DrSherrill; Hx microcytic anemia & scant Fe stores c/w IDA, hx of B12 defic- prev on shots, Hypothyroid, Anxiety  Family Hx>  FamHx is NEG for any lung diseases...  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  Current Meds>  Last ChemoRx 10/2015; Amio200, Eliquis5Bid, ToprolXL25Bid, Lasix40AM&20PM, K20, Prav80, Synthroid100, Xanax0.5, Fe-Bid, VitD,   EXAM shows Afeb, VSS, O2sat=97% on RA at rest;  HEENT- neg, mallampati2;  Chest- clear w/o w/r/r;  Heart- RR Gr1/6 SEM w/o r/g;  Abd- soft, nontender, neg;  Ext- neg w/o c/c/e;  Neuro- intact...   Last CT Chest 11/17/14>  Improved thor adenopathy, modHH, norm heart size, diffuse atherosclerosis, R>L pleural effusions, diminished bilat pulm nodules (largest 10m43mIn lingula), mild subpleural reticulation & interstitial prom  Last CXR 11/25/14>  Cardiomegaly & pul vasc congestion, atherosclerotic calcif in Ao, peribronch thickening & flattened diaph w/ bibasilar atx, osteopenia and T12 compression (chronic)  PFTs 02/26/15>  FVC=2.12 (74%), FEV1=1.70 (80%), %1sec=80, mid-flows wnl at 91% predicted; TLC=5.41 (101%), RV=3.42 (136%), RV/TLC=63%; DLCO=56% predicted (DL/VA=82% predicted)  CXR today 05/10/15>  Borderline heart size, some hyperinflation noted, clear lungs, NAD...   LABS 05/10/15>  Chems-  ok x BS=110, Cr=1.13;  CBC- Hg=11.0, MCV=84;  Fe=52 (12%sat), Ferritin=37;  TSH=3.30, FreeT4=1.31;  B12=210;  Folate>23 IMP >>     Abnormal PFT w/ norm airflow, norm lung volumes, air trapping & decr DLCO=56%>  This is a baseline PFT study done 02/2015 & Amio was started 10/2014; DLCO also affected by lung vol & anemia...    CT Chest 10/2014 had some CHF w/ interstitial prominence, sm bilat effusions, but improved adenopathy & diminished pulm  nodules s/p chemotherapy for lymphoma    HBP> on MetopER25Bid, Lasix40AM&20PM, K20    Hx PAF> on Eliquis5Bid, Amiodarone200; she is holding NSR on these meds...    Chr diastolic CHF> on meds above; last BNP was 11/2014= 378...    Hyperlipidemia> on Prav80 + Fish Oil    Hypothyroid> on Synthroid100    Osteopenia and T12 compression    Hx non-hodgkins lymphoma> dx 6/16 & treated w/ 4 cycles chemoRx (last 10/28/14)    Anemia> on FeBid since 6/16 & followed by DrSherrill; last CBC 03/11/15 showed Hg=10.6, MCV=81; will discuss w/ DrSherrill re: poss IV iron therapy & she definitely needs to get back on her B12 supplementation.    Hx B12 defic> she is not currently taking B12 shots or supplements;  Vit B12 blood level 04/2015 off supplement for ?39yr 210 PLAN >>     SAadyaindicates that her DOE is improving, getting better "It helps me to walk" she notes;  We are hindered by the timing of her studies- starting on Amio 10/2014, last CTChest 10/2014, 1st PFT 02/2015, her chronic diastolic CHF, mild anemia, etc... She will continue same meds, continue to exercise, f/u w/ Oncology & we will recheck pt in 6 weeks...   ~  June 29, 2015:  6wk ROV w/ SN>  Amanda Sellers that her dyspnea is much improved overall- she denies CP, cough, sput, palpit, dizziness, edema, etc;  She says she developed a fever blister on her lower lip ~3wks ago, "couldn't get an appt w/ Derm until end of the month";  Now w/ large round lesion left side lower lip => refer to ENT NOW...   Prob list as noted above- on O2 "prn" per DrSherrill (she uses it occas at night), Eliquis5Bid, Amio200, Metop25Bid, Lasix40AM&20PM, K20, Prav80, Fe-Bid, VitD50K/wk, Synthroid100, Alpraz0.5prn... Her B12 level was 210 04/2015 & she is asked to start VitB12 shots Qmo again (daughter will give these to her at home)...    She had f/u AFib clinic 44/97/02 AFib, diastolicCHF; she had TEE & DCCV 09/2014, then recurrent AFib & again cardioverted 11/2014, maintaining  NSR since then- their extensive note is reviewed & she is much improved at present...    She saw DrSherrill 36/3/78>f/u on her follicular lymphoma, anemia (Fe de427f & hx B12 defic); felt to be in clinical remission & he continues to follow Q4m77mo    She saw DrMcGowen 06/25/15- note reviewed... IMP/PLAN>>   She is rec to continue current meds; restart B12 shots monthly per her daughter who is an RN,Therapist, sportsefer to ENT for Bx/ excision of left lower lip lesion... We plan ROV recheck in 8mo40mo ~  October 04, 2015:  8mo 91mow/ SN>  ShirlGaliarns and notes some SOB in hot weather & w/ exertion; stress also contributes as her sister passed away recently; she remains too sedentary & not getting regular exercise... 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    CT Chest 10/2014 had some CHF w/ interstitial prominence, sm bilat effusions, but improved adenopathy & diminished pulm nodules s/p chemotherapy for lymphoma    HBP> on MetopER25Bid, Lasix40Bid, K20;  BP= 124/70 7 she denies CP, palpit, dizzy, edema...    Hx PAF> on Eliquis5Bid, Amiodarone200; she is holding NSR on these meds & followed by DrHarding- seen 08/09/15 w/ HBP, mild diastolicCHF & PAF; no change in meds...    Chr diastolic CHF> on meds above; last BNP was 11/2014= 378...    Hyperlipidemia> on Prav80 + Fish Oil; last FLP was 11/16 showing TChol 150, TG 135, HDL 49, LDL 74    Hypothyroid> on Synthroid100; last TSH was 4/17 showing TSH= 3.30    Osteopenia and T12 compression> Imaging reveals stable chr T12 compression fx; ?last BMD 2008 w/ lowest Tscore -2.3 in left FemNeck on WomensMVI, VitD 50K/wk, wt bearing exercise but no bone building  meds...    Hx non-hodgkins lymphoma> dx 6/16 & treated w/ 4 cycles chemoRx (last 10/28/14)- Imaging 10/16 w/ signif decr adenopathy in chest/ abd/ pelvis, resolution of splenomegaly, decr in pulm & renal nodularity, incr bilat pleural effusions & basilar atx; she last saw DrSherrill 08/05/15- good appetite & energy level, he felt she remained in remission & planed f/u 31mo   Anemia> on FeBid since 6/16 & followed by DrSherrill; last CBC 7/17 showed Hg=10.9, MCV=87...    Hx B12 defic> Vit B12 blood level 04/2015 off supplement for ?168yr210; we restarted VitB12 shots=> Vit B12 level 9/17 = 484 EXAM shows Afeb, VSS, O2sat=98% on RA at rest;  HEENT- neg, mallampati2, NEW- large lesion lower lip;  Chest- clear w/o w/r/r;  Heart- RR Gr1/6 SEM w/o r/g;  Abd- soft, nontender, neg;  Ext- VI w/o c/c/e;  Neuro- intact...  IMP/PLAN>>  Note- pt has O2 per DrSherrill & states "I use it at night when I need it for SOB"; OK 2017 Flu vaccine today; we reviewed low carb diet & incr exercise program... we plan rov recheck 40m57mo   ~  April 10, 2016:  40mo44mo & ShirAzkac/o breathing worse & notes SOB w/ ADLs x 2-3 wks now she says but she has no idea as to why? She denies cough, sput, hemoptysis; denies CP, palpit, dizzy, edema; just c/o SOB w/ activity & when probed further for more information she notes SOB w/ ADLs but she continues to work-out on her exercise bike 2x per day w/o difficulty & NOT SOB on this; she says the SOB w/ ADLs is a sensation of not being able to get a deep breath "IN" but she does not note any wheezing etc; she is not aware of any incr stress, she has Xanax but not taking... From the pulmonary standpoint she is a never smoker, had an isolated decr in DLCO on PFTs (on Amio), started on nocturnal O2 by DrSherrill while she was on chemotherapy for her Lymphoma w/ pulm nodules on CT (these diminished after Chemo as per her last CT Chest 10/2014), and is under treatment for her DiastolicCHF & doing satis  on Lasix40Bid... She has maintained regular f/u w/ her specialist>>    She had f/u visit w/ ENT-DrByers 11/19/15> s/p excision of  squamous cell ca left lower lip 07/15/15 & doing well...    She saw DrSherrill 11/26/15> f/u Non-hodgkins Lymphoma- it's been one year since last scans but there was no mention of re-checking CT Chest/ Abd/ Pelvis, they plan ROV in 26mo..    She was seen in the AF Clinic 16/4/68> DiastolicCHF & PAFib- s/p DCCV 9/17, placed on Eliquis, subseq return of AFib & had to start Amio200, Metoprolol=> now maintaining NSR & dyspnea improved after diuresis...    She saw her PCP- DrMcGowen 02/07/16> f/u HBP(controlled on Metoprolol, Lasix, KCl), HL (at goals on Prav80), Hypothy (stable on Synthroid100), CKD (stable w/ Cr=1.14)... EXAM shows Afeb, VSS, O2sat=100% on RA at rest;  HEENT- neg, mallampati2, lip lesion resolved;  Chest- clear w/o w/r/r;  Heart- RR Gr1/6 SEM w/o r/g;  Abd- soft, nontender, neg;  Ext- VI w/o c/c/e;  Neuro- intact...    CXR 04/10/16>  Mild cardiomeg, clear lungs w/o edema/ infiltrate/ or pleural effusions- NAD, modHH, mild degen changes in Tspine w/ compression of T12...  Ambulatory Oximetry 04/10/16>  O2sat=97% on RA at rest w/ pulse=56/min;  She walked just 2 Laps (185' ea) w/ lowest O2sat=97% w/ pulse up to 95/min... IMP/PLAN>>  We discussed how this dyspnea is diff than her prev SOB w/ AFib & CHF- esp how she feels SOB w/ ADLs (can't get a deep breath "IN") but able to work-out on her exercise bike Bid w/o difficulty;  In this regard I have rec a trial of KLONOPIN 0.555m1/2 to 1 tab po Bid to relax her chest wall muscles and allow a deeper more satisfying breath; we plan rov recheck to document response in 79m38mo    Past Medical History:  Diagnosis Date  . Anticoagulation adequate, Eliquis 10/16/2014  . Anxiety    pt denies  . Atrial fibrillation (HCCFerron  With RVR; elec cardioversion 11/30/14.  On Amio since 10/2014; Dr. NadLenna Gilfordllowing her from pulm  standpoint regarding this med.  . Chronic diastolic congestive heart failure (HCCGrayson9/03/21/2248Complicated by atrial fibrillation; Echo 07/23/14: mild LVH, EF 50-55%, grade 2 diast dysfxn  . Chronic renal insufficiency, stage III (moderate)    GFR @ 50  . COPD (chronic obstructive pulmonary disease) (HCCHighland Meadows  recently noted on Xray, does not have any problems  . Family history of adverse reaction to anesthesia    daughter has difficulty waking up  . Follicular lymphoma (HCCHomewood Canyon  Non Hodgkins B cell lymphoma; s/p chemo summer 2016--in remission as of 11/2015.  . HMarland KitchenD (hyperlipidemia)     06/2014  . HTN (hypertension)   . Hypothyroidism   . Lip cancer    Dr. ByeJanace Hoardcised this: invasive SCC--no sign of cancer at ENT f/u 10/2015  . Microcytic anemia    transfused 3 U total in hosp 07/2014  . Olecranon bursitis of right elbow 02/2015  . Pancytopenia due to antineoplastic chemotherapy (HCCOklee . Positive occult stool blood test 08/08/14   Endoscopies ok 08/2014  . Pulmonary metastases (HCCSilver Ridge/01/2014  . Shortness of breath dyspnea    with exertion  . Vitamin B 12 deficiency   . Vitamin D deficiency     Past Surgical History:  Procedure Laterality Date  . ABDOMINAL HYSTERECTOMY  1978  . BONE MARROW BIOPSY  07/24/14  . BREAST BIOPSY    . CARDIOVERSION N/A 10/16/2014   Procedure: CARDIOVERSION;  Surgeon: BriLelon PerlaD;  [facility];  Laterality: N/A;  . CARDIOVERSION N/A 11/30/2014   Procedure: CARDIOVERSION;  Surgeon: Larey Dresser, MD;  [facility]COPY;  Service: Cardiovascular;  Laterality: N/A;  . COLONOSCOPY  09/23/14   small hemorrhoids, otherwise normal (performed for IDA and heme+ stool)  . COLONOSCOPY    . MASS EXCISION Left 07/15/2015   Left lower lip mass--invasive SCC w/negative margins.  Procedure: EXCISION MASS;  Surgeon: Melissa Montane, MD;  Location: [facility];  Service: ENT;  Laterality: Left;  Wedge excision left lower lip mass  . NM  MYOVIEW LTD  10/29/2014   medium size mild surgery defect in the mid anterior and apical anterior location suggestive of breast attenuation. No reversibility. LOW RISK  . PFTs  02/2015   Restriction with diffusion defect: cardiologist referred her to pulm to help interpret PFTs and decide whether she has amiodarone toxicity  . TEE WITHOUT CARDIOVERSION N/A 10/16/2014   Procedure: TRANSESOPHAGEAL ECHOCARDIOGRAM (TEE);  Surgeon: Lelon Perla, MD;  Location[facility]COPY;  Service: Cardiovascular;  Laterality: N/A;  . TRANSTHORACIC ECHOCARDIOGRAM  07/23/14   mild LVH, EF 50-55%, grade 2 diast dysfxn  . UPPER GI ENDOSCOPY  09/23/2014   small hiatus hernia, nodules in stomach biopsied (chronic active erosive atrophic gastritis with intestinal metaplasia--no dysplasia or malignancy) otherwise normal    Outpatient Encounter Prescriptions as of 04/10/2016  Medication Sig  . ALPRAZolam (XANAX) 0.5 MG tablet 1 tab po qhs prn anxiety-related insomnia  . amiodarone (PACERONE) 200 MG tablet Take 1 tablet (200 mg total) by mouth daily.  Marland Kitchen apixaban (ELIQUIS) 5 MG TABS tablet Take 1 tablet (5 mg total) by mouth 2 (two) times daily.  . cyanocobalamin (,VITAMIN B-12,) 1000 MCG/ML injection Inject 1 mL (1,000 mcg total) into the skin every 30 (thirty) days.  . ergocalciferol (VITAMIN D2) 50000 units capsule Take 1 capsule (50,000 Units total) by mouth every Sunday.  . ferrous sulfate 325 (65 FE) MG tablet Take 1 tablet (325 mg total) by mouth 2 (two) times daily with a meal.  . fish oil-omega-3 fatty acids 1000 MG capsule Take 2 g by mouth daily.  . furosemide (LASIX) 40 MG tablet TAKE ONE TABLET BY MOUTH TWICE DAILY  . levothyroxine (SYNTHROID, LEVOTHROID) 100 MCG tablet Take 1 tablet (100 mcg total) by mouth daily before breakfast.  . metoprolol succinate (TOPROL-XL) 25 MG 24 hr tablet Take 25 mg by mouth 2 (two) times daily.  . OXYGEN Inhale into the lungs. 1Lt at bedtime  . potassium chloride 20 MEQ TBCR  Take 20 mEq by mouth daily.  . pravastatin (PRAVACHOL) 80 MG tablet TAKE ONE TABLET BY MOUTH DAILY  . prochlorperazine (COMPAZINE) 10 MG tablet Take 10 mg by mouth every 8 (eight) hours as needed for nausea or vomiting. Reported on 08/05/2015   No facility-administered encounter medications on file as of 04/10/2016.     Allergies  Allergen Reactions  . Iodinated Diagnostic Agents Rash and Other (See Comments)    CT Contrast    Immunization History  Administered Date(s) Administered  . Influenza,inj,Quad PF,36+ Mos 10/27/2014, 10/04/2015  . Pneumococcal Conjugate-13 02/07/2016  . Pneumococcal-Unspecified 01/23/2014  . Tdap 12/09/2014  . Zoster 03/19/2012    Current Medications, Allergies, Past Medical History, Past Surgical History, Family History, and Social History were reviewed in [employer] record.   Review of Systems             All symptoms NEG except where BOLDED >>  Constitutional:  F/C/S, fatigue, anorexia, unexpected weight  change. HEENT:  HA, visual changes, hearing loss, earache, nasal symptoms, sore throat, mouth sores, hoarseness. Resp:  cough, sputum, hemoptysis; SOB, tightness, wheezing. Cardio:  CP, palpit, DOE, orthopnea, edema. GI:  N/V/D/C, blood in stool; reflux, abd pain, distention, gas. GU:  dysuria, freq, urgency, hematuria, flank pain, voiding difficulty. MS:  joint pain, swelling, tenderness, decr ROM; neck pain, back pain, etc. Neuro:  HA, tremors, seizures, dizziness, syncope, weakness, numbness, gait abn. Skin:  suspicious lesions or skin rash. Heme:  adenopathy, bruising, bleeding. Psyche:  confusion, agitation, sleep disturbance, hallucinations, anxiety, depression suicidal.   Objective:   Physical Exam       Vital Signs:  Reviewed...   General:  WD, WN, 83 y/o WF in NAD; alert & oriented; pleasant & cooperative... HEENT:  [place]/AT; Conjunctiva- pink, Sclera- nonicteric, EOM-wnl, PERRLA, EACs-clear, TMs-wnl; NOSE-clear;  THROAT-clear & wnl.  Neck:  Supple w/ fair ROM; no JVD; normal carotid impulses w/o bruits; no thyromegaly or nodules palpated; no lymphadenopathy.  Chest:  Clear to P & A; without wheezes, rales, or rhonchi heard. Heart:  Regular Rhythm; Gr1/6 SEM, without rubs or gallops detected. Abdomen:  Soft & nontender- no guarding or rebound; normal bowel sounds; no organomegaly or masses palpated. Ext:  decrROM; without deformities +arthritic changes; no varicose veins, venous insuffic, or edema;  Pulses intact w/o bruits. Neuro:  CNs II-XII intact; motor testing normal; sensory testing normal; gait normal & balance OK. Derm:  No lesions noted; no rash etc. Lymph:  No cervical, supraclavicular, axillary, or inguinal adenopathy palpated.   Assessment:      IMP >>     S/P resection of invasive squamous cell ca on left lower lip by DrByers6/2017    DYSPNEA> multifactorial- from AF w/ rvr, diastolicCHF, decr DLCO on PFT, anemia, anxiety...    Abnormal PFT w/ norm airflow, norm lung volumes, air trapping & decr DLCO=56%>  This is a baseline PFT study done 02/2015 & Amio was started 10/2014; DLCO also affected by lung vol & anemia...    CT Chest 10/2014 had some CHF w/ interstitial prominence, sm bilat effusions, but improved adenopathy & diminished pulm nodules s/o chemotherapy for lymphoma    HBP> on MetopER25Bid, Lasix40AM&20PM, K20    Hx PAF> on Eliquis5Bid, Amiodarone200; she is holding NSR on these meds...    Chr diastolic CHF> on meds above; last BNP was 11/2014= 378...    Hyperlipidemia> on Prav80 + Fish Oil    Hypothyroid> on Synthroid100    Osteopenia and T12 compression    Hx non-hodgkins lymphoma> dx 6/16 & treated w/ 4 cycles chemoRx (last 10/28/14)    Anemia> on FeBid since 6/16 & followed by DrSherrill; last CBC 03/11/15 showed Hg=10.6, MCV=81; will discuss w/ DrSherrill re: poss IV iron therapy & she definitely needs to get back on her B12 supplementation.    Hx B12 defic> she is not  currently taking B12 shots or supplements & B12 level 04/2015 = 210; rec to restart her B12 shots 1048mg/mo (admit by her RN daughter)...   PLAN >>   05/10/15>   SNailaniindicates that her DOE is improving, getting better "It helps me to walk" she notes;  We are indered by the timing of her studies- starting on Amio 10/2014, last CTChest 10/2014, 1st PFT 02/2015, her chronic diastolic CHF, mild anemia, etc... She will continue same meds, continue to exercise, f/u w/ Oncology & we will recheck pt in 6 weeks...  06/29/15>   She is rec to continue current meds;  restart B12 shots monthly per her daughter who is an Therapist, sports, refer to ENT for Bx/ excision of left lower lip lesion. 10/04/15>   Note- pt has O2 per DrSherrill & states "I use it at night when I need it for SOB"; OK 2017 Flu vaccine today; we reviewed low carb diet & incr exercise program... we plan rov recheck 78mo3/19/18>   We discussed how this dyspnea is diff than her prev SOB w/ AFib & CHF- esp how she feels SOB w/ ADLs (can't get a deep breath "IN") but able to work-out on her exercise bike Bid w/o difficulty;  In this regard I have rec a trial of KLONOPIN 0.569m1/2 to 1 tab po Bid to relax her chest wall muscles and allow a deeper more satisfying breath; we plan rov recheck to document response in 30m45mo   Plan:     Patient's Medications  New Prescriptions   CLONAZEPAM (KLONOPIN) 0.5 MG TABLET    Take 1/2 to 1 tablet by mouth two times daily as needed for shortness of breath  Previous Medications   ALPRAZOLAM (XANAX) 0.5 MG TABLET    1 tab po qhs prn anxiety-related insomnia   AMIODARONE (PACERONE) 200 MG TABLET    Take 1 tablet (200 mg total) by mouth daily.   APIXABAN (ELIQUIS) 5 MG TABS TABLET    Take 1 tablet (5 mg total) by mouth 2 (two) times daily.   CYANOCOBALAMIN (,VITAMIN B-12,) 1000 MCG/ML INJECTION    Inject 1 mL (1,000 mcg total) into the skin every 30 (thirty) days.   ERGOCALCIFEROL (VITAMIN D2) 50000 UNITS CAPSULE    Take 1  capsule (50,000 Units total) by mouth every Sunday.   FERROUS SULFATE 325 (65 FE) MG TABLET    Take 1 tablet (325 mg total) by mouth 2 (two) times daily with a meal.   FISH OIL-OMEGA-3 FATTY ACIDS 1000 MG CAPSULE    Take 2 g by mouth daily.   FUROSEMIDE (LASIX) 40 MG TABLET    TAKE ONE TABLET BY MOUTH TWICE DAILY   LEVOTHYROXINE (SYNTHROID, LEVOTHROID) 100 MCG TABLET    Take 1 tablet (100 mcg total) by mouth daily before breakfast.   METOPROLOL SUCCINATE (TOPROL-XL) 25 MG 24 HR TABLET    Take 25 mg by mouth 2 (two) times daily.   OXYGEN    Inhale into the lungs. 1Lt at bedtime   POTASSIUM CHLORIDE 20 MEQ TBCR    Take 20 mEq by mouth daily.   PRAVASTATIN (PRAVACHOL) 80 MG TABLET    TAKE ONE TABLET BY MOUTH DAILY   PROCHLORPERAZINE (COMPAZINE) 10 MG TABLET    Take 10 mg by mouth every 8 (eight) hours as needed for nausea or vomiting. Reported on 08/05/2015  Modified Medications   No medications on file  Discontinued Medications   No medications on file

## 2016-04-12 ENCOUNTER — Encounter: Payer: Self-pay | Admitting: Family Medicine

## 2016-04-21 ENCOUNTER — Ambulatory Visit (HOSPITAL_BASED_OUTPATIENT_CLINIC_OR_DEPARTMENT_OTHER): Payer: PPO | Admitting: Oncology

## 2016-04-21 ENCOUNTER — Other Ambulatory Visit (HOSPITAL_BASED_OUTPATIENT_CLINIC_OR_DEPARTMENT_OTHER): Payer: PPO

## 2016-04-21 ENCOUNTER — Telehealth: Payer: Self-pay | Admitting: Oncology

## 2016-04-21 VITALS — BP 143/56 | HR 60 | Temp 97.8°F | Resp 18 | Ht 66.0 in | Wt 197.7 lb

## 2016-04-21 DIAGNOSIS — Z8572 Personal history of non-Hodgkin lymphomas: Secondary | ICD-10-CM

## 2016-04-21 DIAGNOSIS — R0609 Other forms of dyspnea: Secondary | ICD-10-CM | POA: Diagnosis not present

## 2016-04-21 DIAGNOSIS — C8200 Follicular lymphoma grade I, unspecified site: Secondary | ICD-10-CM

## 2016-04-21 DIAGNOSIS — L989 Disorder of the skin and subcutaneous tissue, unspecified: Secondary | ICD-10-CM | POA: Diagnosis not present

## 2016-04-21 DIAGNOSIS — D649 Anemia, unspecified: Secondary | ICD-10-CM

## 2016-04-21 DIAGNOSIS — D6489 Other specified anemias: Secondary | ICD-10-CM

## 2016-04-21 LAB — CBC WITH DIFFERENTIAL/PLATELET
BASO%: 0.7 % (ref 0.0–2.0)
Basophils Absolute: 0 10*3/uL (ref 0.0–0.1)
EOS%: 2.4 % (ref 0.0–7.0)
Eosinophils Absolute: 0.1 10*3/uL (ref 0.0–0.5)
HCT: 32.2 % — ABNORMAL LOW (ref 34.8–46.6)
HGB: 10.6 g/dL — ABNORMAL LOW (ref 11.6–15.9)
LYMPH%: 20.2 % (ref 14.0–49.7)
MCH: 26.3 pg (ref 25.1–34.0)
MCHC: 32.9 g/dL (ref 31.5–36.0)
MCV: 80.1 fL (ref 79.5–101.0)
MONO#: 0.5 10*3/uL (ref 0.1–0.9)
MONO%: 12.8 % (ref 0.0–14.0)
NEUT#: 2.6 10*3/uL (ref 1.5–6.5)
NEUT%: 63.9 % (ref 38.4–76.8)
Platelets: 200 10*3/uL (ref 145–400)
RBC: 4.02 10*6/uL (ref 3.70–5.45)
RDW: 16.5 % — ABNORMAL HIGH (ref 11.2–14.5)
WBC: 4.1 10*3/uL (ref 3.9–10.3)
lymph#: 0.8 10*3/uL — ABNORMAL LOW (ref 0.9–3.3)

## 2016-04-21 NOTE — Progress Notes (Signed)
  [place] OFFICE PROGRESS NOTE   Diagnosis: Non-Hodgkin's lymphoma  INTERVAL HISTORY:   Amanda Sellers returns as scheduled. She recently had increased exertional dyspnea. She was placed on Klonopin by Dr.Nadel and reports improvement. Good appetite. No fever or night sweats. No palpable lymph nodes. She is working 3 days per week. No bleeding.  Objective:  Vital signs in last 24 hours:  Blood pressure (!) 143/56, pulse 60, temperature 97.8 F (36.6 C), temperature source Oral, resp. rate 18, height _0  (1.676 m), weight 197 lb 11.2 oz (89.7 kg), SpO2 98 %.    HEENT: Neck without mass Lymphatics: No cervical, supraclavicular, axillary, or inguinal nodes Resp: Lungs clear bilaterally Cardio: Regular rate and rhythm GI: No hepatosplenomegaly Vascular: No leg edema  Lab Results:  Lab Results  Component Value Date   WBC 4.1 04/21/2016   HGB 10.6 (L) 04/21/2016   HCT 32.2 (L) 04/21/2016   MCV 80.1 04/21/2016   PLT 200 04/21/2016   NEUTROABS 2.6 04/21/2016     Medications: I have reviewed the patient's current medications.  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 Severe microcytic anemia, status post transfusion with packed red blood cells 07/23/2014   Normal ferritin, low transferrin saturation, "scant" bone marrow iron stores, review of peripheral  blood smear consistent with iron deficiency anemia   colonoscopy and upper endoscopy 09/23/2014 3. Exertional dyspnea /orthopnea-most likely secondary to congestive heart failure  4. History of B-12 deficiency  5. Rash over the trunk and extremities 08/18/2014. Appears consistent with a drug rash. Resolved 08/28/2014.  Rash 11/18/2014, likely secondary to a contrast allergy, diagnostic contrast will be listed as an allergy 6. Colonoscopy 06/03/2008. Medium sized internal hemorrhoids.  7. Admission with atrial flutter with a rapid ventricular response 10/15/2014-status post cardioversion 10/16/2014, maintained on  apixaban  Repeat cardioversion 11/30/2014  8. Right olecranon skin lesion-likely a lipoma or cyst 9. 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 She has persistent mild anemia. She will return for a CBC and iron studies in 3 months. She will be scheduled for a 6 month office visit. The anemia may be related to subclinical bleeding while on anticoagulation therapy. She will continue iron.  [name], MD  04/21/2016  10:22 AM

## 2016-04-21 NOTE — Telephone Encounter (Signed)
Gave patient AVS and calender per 04/21/2016 los.  

## 2016-04-23 DIAGNOSIS — M170 Bilateral primary osteoarthritis of knee: Secondary | ICD-10-CM

## 2016-04-23 HISTORY — DX: Bilateral primary osteoarthritis of knee: M17.0

## 2016-04-24 ENCOUNTER — Encounter: Payer: Self-pay | Admitting: Family Medicine

## 2016-05-08 ENCOUNTER — Ambulatory Visit (INDEPENDENT_AMBULATORY_CARE_PROVIDER_SITE_OTHER): Payer: PPO | Admitting: Pulmonary Disease

## 2016-05-08 ENCOUNTER — Encounter: Payer: Self-pay | Admitting: Pulmonary Disease

## 2016-05-08 VITALS — BP 148/64 | HR 54 | Temp 97.6°F | Ht 66.0 in | Wt 201.5 lb

## 2016-05-08 DIAGNOSIS — D6489 Other specified anemias: Secondary | ICD-10-CM | POA: Diagnosis not present

## 2016-05-08 DIAGNOSIS — R0600 Dyspnea, unspecified: Secondary | ICD-10-CM | POA: Diagnosis not present

## 2016-05-08 DIAGNOSIS — I10 Essential (primary) hypertension: Secondary | ICD-10-CM

## 2016-05-08 DIAGNOSIS — Z79899 Other long term (current) drug therapy: Secondary | ICD-10-CM | POA: Diagnosis not present

## 2016-05-08 DIAGNOSIS — I480 Paroxysmal atrial fibrillation: Secondary | ICD-10-CM

## 2016-05-08 DIAGNOSIS — R942 Abnormal results of pulmonary function studies: Secondary | ICD-10-CM | POA: Diagnosis not present

## 2016-05-08 DIAGNOSIS — C8200 Follicular lymphoma grade I, unspecified site: Secondary | ICD-10-CM | POA: Diagnosis not present

## 2016-05-08 DIAGNOSIS — M150 Primary generalized (osteo)arthritis: Secondary | ICD-10-CM

## 2016-05-08 DIAGNOSIS — M159 Polyosteoarthritis, unspecified: Secondary | ICD-10-CM

## 2016-05-08 DIAGNOSIS — I5032 Chronic diastolic (congestive) heart failure: Secondary | ICD-10-CM | POA: Diagnosis not present

## 2016-05-08 DIAGNOSIS — M8949 Other hypertrophic osteoarthropathy, multiple sites: Secondary | ICD-10-CM

## 2016-05-08 DIAGNOSIS — M1990 Unspecified osteoarthritis, unspecified site: Secondary | ICD-10-CM | POA: Insufficient documentation

## 2016-05-08 MED ORDER — TRAMADOL HCL 50 MG PO TABS: 50.0000 mg | ORAL_TABLET | Freq: Two times a day (BID) | ORAL | 5 refills | Status: DC

## 2016-05-08 NOTE — Progress Notes (Signed)
Subjective:     Patient ID: Amanda Sellers, female   DOB: [birth year], 83 y.o.   MRN: [id number]  HPI    83 y/o WF, referred by DrHarding due to an abnormal PFT revealing a low DLCO, pt is on amiodarone for AFib...   ~  May 10, 2015:  Initial Pulmonary consultation by SN>  She is here today w/ her daughter Amanda Sellers, an RN from the ICU at [facility];  Her PCP is DrPMcGowen in [place]...    83 y/o WF referred by DrHarding for a decr DLCO on Amiodarone for AFib... She notes DOE w/ exertion but states that it's getting better w/ a walking regimen & feels that she is getting stronger;  She has a hx of a follicular lymphoma and had chemoRx from DrSherrill (his note of 03/29/15 is reviewed)- diagnosed 06/2014 & treated w/ 4 cycles of bendamustine/ rituximab w/ f/u CT showing signif improvement in adenopathy, lung nodules and renal lesions;  NOTE> she was placed on O2 for nocturnal use by DrSherrill during her ChemoRx, uses it prn only but doesn't want to give it up!... She saw DrHarding last 01/2015 for f/u AFib & CHF- on Amiodarone since 11/2014;  NOTE> she states her breathing is much better since her successful 2nd DCCV=> NSR...  2DEcho 07/22/14:  Mild LVH, borderline LVF w/ EF=50-55%, no regional wall motion abn, Gr2DD, trivAI, calcif mitral annulus, mod LAdil at 39m, PAsys=369mg  Myoview 10/29/2014:  There is a medium defect of mild severity present in the mid anterior and apical anterior location. This is a low risk study. Breast attenuation artifact Study would not gate secondary to afib and PVCs.  TEE 10/16/2014: Normal LV function (EF 55-60%); biatrial enlargment; spontaneous contrast in LAA but no thrombus; mild AI; moderate MR (2+); mild TR.  Smoking Hx>  She is a never smoker.  Pulmonary Hx>  No prev hx of lung dis- denies hx asthma, no prev bouts of bronchitis, never had pneumonia, no hx TB or known exposure.    Cardiac Hx>  Adm 09/2014 w/ AFlutter & rvr, s/p cardioversion x2 (last 11/2014); HBP,  Chronic diastolic CHF, Valvular dis w/ modMR & mild AI  Medical Hx>  Hx non-Hodgkins Lymphoma 06/2014 treated w/ bendamustine/ rituximab by DrSherrill; Hx microcytic anemia & scant Fe stores c/w IDA, hx of B12 defic- prev on shots, Hypothyroid, Anxiety  Family Hx>  FamHx is NEG for any lung diseases...  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  Current Meds>  Last ChemoRx 10/2015; Amio200, Eliquis5Bid, ToprolXL25Bid, Lasix40AM&20PM, K20, Prav80, Synthroid100, Xanax0.5, Fe-Bid, VitD,   EXAM shows Afeb, VSS, O2sat=97% on RA at rest;  HEENT- neg, mallampati2;  Chest- clear w/o w/r/r;  Heart- RR Gr1/6 SEM w/o r/g;  Abd- soft, nontender, neg;  Ext- neg w/o c/c/e;  Neuro- intact...   Last CT Chest 11/17/14>  Improved thor adenopathy, modHH, norm heart size, diffuse atherosclerosis, R>L pleural effusions, diminished bilat pulm nodules (largest 10m43mIn lingula), mild subpleural reticulation & interstitial prom  Last CXR 11/25/14>  Cardiomegaly & pul vasc congestion, atherosclerotic calcif in Ao, peribronch thickening & flattened diaph w/ bibasilar atx, osteopenia and T12 compression (chronic)  PFTs 02/26/15>  FVC=2.12 (74%), FEV1=1.70 (80%), %1sec=80, mid-flows wnl at 91% predicted; TLC=5.41 (101%), RV=3.42 (136%), RV/TLC=63%; DLCO=56% predicted (DL/VA=82% predicted)  CXR today 05/10/15>  Borderline heart size, some hyperinflation noted, clear lungs, NAD...   LABS 05/10/15>  Chems-  ok x BS=110, Cr=1.13;  CBC- Hg=11.0, MCV=84;  Fe=52 (12%sat), Ferritin=37;  TSH=3.30, FreeT4=1.31;  B12=210;  Folate>23 IMP >>     Abnormal PFT w/ norm airflow, norm lung volumes, air trapping & decr DLCO=56%>  This is a baseline PFT study done 02/2015 & Amio was started 10/2014; DLCO also affected by lung vol & anemia...    CT Chest 10/2014 had some CHF w/ interstitial prominence, sm bilat effusions, but improved adenopathy & diminished pulm  nodules s/p chemotherapy for lymphoma    HBP> on MetopER25Bid, Lasix40AM&20PM, K20    Hx PAF> on Eliquis5Bid, Amiodarone200; she is holding NSR on these meds...    Chr diastolic CHF> on meds above; last BNP was 11/2014= 378...    Hyperlipidemia> on Prav80 + Fish Oil    Hypothyroid> on Synthroid100    Osteopenia and T12 compression    Hx non-hodgkins lymphoma> dx 6/16 & treated w/ 4 cycles chemoRx (last 10/28/14)    Anemia> on FeBid since 6/16 & followed by DrSherrill; last CBC 03/11/15 showed Hg=10.6, MCV=81; will discuss w/ DrSherrill re: poss IV iron therapy & she definitely needs to get back on her B12 supplementation.    Hx B12 defic> she is not currently taking B12 shots or supplements;  Vit B12 blood level 04/2015 off supplement for ?39yr 210 PLAN >>     SAadyaindicates that her DOE is improving, getting better "It helps me to walk" she notes;  We are hindered by the timing of her studies- starting on Amio 10/2014, last CTChest 10/2014, 1st PFT 02/2015, her chronic diastolic CHF, mild anemia, etc... She will continue same meds, continue to exercise, f/u w/ Oncology & we will recheck pt in 6 weeks...   ~  June 29, 2015:  6wk ROV w/ SN>  SEdanareports that her dyspnea is much improved overall- she denies CP, cough, sput, palpit, dizziness, edema, etc;  She says she developed a fever blister on her lower lip ~3wks ago, "couldn't get an appt w/ Derm until end of the month";  Now w/ large round lesion left side lower lip => refer to ENT NOW...   Prob list as noted above- on O2 "prn" per DrSherrill (she uses it occas at night), Eliquis5Bid, Amio200, Metop25Bid, Lasix40AM&20PM, K20, Prav80, Fe-Bid, VitD50K/wk, Synthroid100, Alpraz0.5prn... Her B12 level was 210 04/2015 & she is asked to start VitB12 shots Qmo again (daughter will give these to her at home)...    She had f/u AFib clinic 44/97/02 AFib, diastolicCHF; she had TEE & DCCV 09/2014, then recurrent AFib & again cardioverted 11/2014, maintaining  NSR since then- their extensive note is reviewed & she is much improved at present...    She saw DrSherrill 36/3/78>f/u on her follicular lymphoma, anemia (Fe de427f & hx B12 defic); felt to be in clinical remission & he continues to follow Q4m77mo    She saw DrMcGowen 06/25/15- note reviewed... IMP/PLAN>>   She is rec to continue current meds; restart B12 shots monthly per her daughter who is an RN,Therapist, sportsefer to ENT for Bx/ excision of left lower lip lesion... We plan ROV recheck in 8mo40mo ~  October 04, 2015:  8mo 91mow/ SN>  Amanda Sellers and notes some SOB in hot weather & w/ exertion; stress also contributes as her sister passed away recently; she remains too sedentary & not getting regular exercise... 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    CT Chest 10/2014 had some CHF w/ interstitial prominence, sm bilat effusions, but improved adenopathy & diminished pulm nodules s/p chemotherapy for lymphoma    HBP> on MetopER25Bid, Lasix40Bid, K20;  BP= 124/70 7 she denies CP, palpit, dizzy, edema...    Hx PAF> on Eliquis5Bid, Amiodarone200; she is holding NSR on these meds & followed by DrHarding- seen 08/09/15 w/ HBP, mild diastolicCHF & PAF; no change in meds...    Chr diastolic CHF> on meds above; last BNP was 11/2014= 378...    Hyperlipidemia> on Prav80 + Fish Oil; last FLP was 11/16 showing TChol 150, TG 135, HDL 49, LDL 74    Hypothyroid> on Synthroid100; last TSH was 4/17 showing TSH= 3.30    Osteopenia and T12 compression> Imaging reveals stable chr T12 compression fx; ?last BMD 2008 w/ lowest Tscore -2.3 in left FemNeck on WomensMVI, VitD 50K/wk, wt bearing exercise but no bone building  meds...    Hx non-hodgkins lymphoma> dx 6/16 & treated w/ 4 cycles chemoRx (last 10/28/14)- Imaging 10/16 w/ signif decr adenopathy in chest/ abd/ pelvis, resolution of splenomegaly, decr in pulm & renal nodularity, incr bilat pleural effusions & basilar atx; she last saw DrSherrill 08/05/15- good appetite & energy level, he felt she remained in remission & planed f/u 4mo    Anemia> on FeBid since 6/16 & followed by DrSherrill; last CBC 7/17 showed Hg=10.9, MCV=87...    Hx B12 defic> Vit B12 blood level 04/2015 off supplement for ?1yr= 210; we restarted VitB12 shots=> Vit B12 level 9/17 = 484 EXAM shows Afeb, VSS, O2sat=98% on RA at rest;  HEENT- neg, mallampati2, NEW- large lesion lower lip;  Chest- clear w/o w/r/r;  Heart- RR Gr1/6 SEM w/o r/g;  Abd- soft, nontender, neg;  Ext- VI w/o c/c/e;  Neuro- intact...  IMP/PLAN>>  Note- pt has O2 per DrSherrill & states "I use it at night when I need it for SOB"; OK 2017 Flu vaccine today; we reviewed low carb diet & incr exercise program... we plan rov recheck 6mo...  ~  April 10, 2016:  6mo ROV & Amanda Sellers is c/o breathing worse & notes SOB w/ ADLs x 2-3 wks now she says but she has no idea as to why? She denies cough, sput, hemoptysis; denies CP, palpit, dizzy, edema; just c/o SOB w/ activity & when probed further for more information she notes SOB w/ ADLs but she continues to work-out on her exercise bike 2x per day w/o difficulty & NOT SOB on this; she says the SOB w/ ADLs is a sensation of not being able to get a deep breath "IN" but she does not note any wheezing etc; she is not aware of any incr stress, she has Xanax but not taking... From the pulmonary standpoint she is a never smoker, had an isolated decr in DLCO on PFTs (on Amio), started on nocturnal O2 by DrSherrill while she was on chemotherapy for her Lymphoma w/ pulm nodules on CT (these diminished after Chemo as per her last CT Chest 10/2014), and is under treatment for her DiastolicCHF & doing satis on  Lasix40Bid... She has maintained regular f/u w/ her specialist>>    She had f/u visit w/ ENT-DrByers 11/19/15> s/p excision of squamous   cell ca left lower lip 07/15/15 & doing well...    She saw DrSherrill 11/26/15> f/u Non-hodgkins Lymphoma- it's been one year since last scans but there was no mention of re-checking CT Chest/ Abd/ Pelvis, they plan ROV in 66mo..    She was seen in the AF Clinic 13/2/67> DiastolicCHF & PAFib- s/p DCCV 9/17, placed on Eliquis, subseq return of AFib & had to start Amio200, Metoprolol=> now maintaining NSR & dyspnea improved after diuresis...    She saw her PCP- DrMcGowen 02/07/16> f/u HBP(controlled on Metoprolol, Lasix, KCl), HL (at goals on Prav80), Hypothy (stable on Synthroid100), CKD (stable w/ Cr=1.14)... EXAM shows Afeb, VSS, O2sat=100% on RA at rest;  HEENT- neg, mallampati2, lip lesion resolved;  Chest- clear w/o w/r/r;  Heart- RR Gr1/6 SEM w/o r/g;  Abd- soft, nontender, neg;  Ext- VI w/o c/c/e;  Neuro- intact...    CXR 04/10/16>  Mild cardiomeg, clear lungs w/o edema/ infiltrate/ or pleural effusions- NAD, modHH, mild degen changes in Tspine w/ compression of T12...  Ambulatory Oximetry 04/10/16>  O2sat=97% on RA at rest w/ pulse=56/min;  She walked just 2 Laps (185' ea) w/ lowest O2sat=97% w/ pulse up to 95/min... IMP/PLAN>>  We discussed how this dyspnea is diff than her prev SOB w/ AFib & CHF- esp how she feels SOB w/ ADLs (can't get a deep breath "IN") but able to work-out on her exercise bike Bid w/o difficulty;  In this regard I have rec a trial of KLONOPIN 0.'5mg'$  1/2 to 1 tab po Bid to relax her chest wall muscles and allow a deeper more satisfying breath; we plan rov recheck to document response in 168mo.  ~  May 08, 2016:  57m42moV & last visit we recommended Klonopin 0.'5mg'$  Bid for her dyspnea & she reports much improved!  Now her CC is knee pain & she ambulates w/ walker- [facility]; we wrote for  TRAMADOL50 & will set her up w/ Ortho per request...     She had f/u DrSherrill 04/21/16>  Hx non-hodgkin's lymphoma, anemia, hx B12 defic, hx SCCa left lower lip excised 2017;  She remains in remission & is rec to continue on Iron therapy...  EXAM shows Afeb, VSS, O2sat=95% on RA at rest;  HEENT- neg, mallampati2, lip lesion resolved;  Chest- clear w/o w/r/r;  Heart- RR Gr1/6 SEM w/o r/g;  Abd- soft, nontender, neg;  Ext- VI w/o c/c/e;  Neuro- intact...   IMP/PLAN>>  We wrote for Tramadol & will refer her to Ortho to see if a shot would be helpful, discuss aptions;  We plan rov recheck in ~68mo768mo   Past Medical History:  Diagnosis Date  . Anticoagulation adequate, Eliquis 10/16/2014  . Anxiety    pt denies  . Atrial fibrillation (HCC)[place] With RVR; elec cardioversion 11/30/14.  On Amio since 10/2014; Dr. NadeLenna Gilfordlowing her from pulm standpoint regarding this med.  . Chronic diastolic congestive heart failure (HCC)[place]/212/45/8099omplicated by atrial fibrillation; Echo 07/23/14: mild LVH, EF 50-55%, grade 2 diast dysfxn  . Chronic renal insufficiency, stage III (moderate)    GFR @ 50  . COPD (chronic obstructive pulmonary disease) (HCC)[place] recently noted on Xray, does not have any problems  . Family history of adverse reaction to anesthesia    daughter has difficulty waking up  . Follicular lymphoma (HCC)[place] Non Hodgkins B cell lymphoma; s/p chemo  summer 2016--in remission as of 03/2016.  Marland Kitchen HLD (hyperlipidemia)     06/2014  . HTN (hypertension)   . Hypothyroidism   . Lip cancer    Dr. Janace Hoard excised this: invasive SCC--no sign of cancer at ENT f/u 10/2015  . Microcytic anemia    transfused 3 U total in hosp 07/2014  . Olecranon bursitis of right elbow 02/2015  . Pancytopenia due to antineoplastic chemotherapy ([place])   . Positive occult stool blood test 08/08/14   Endoscopies ok 08/2014  . Pulmonary metastases ([place]) 07/24/2014  . Shortness of breath dyspnea    with exertion.  Dr. Lenna Gilford  started her on clonazepam to try to relax her chest wall and allow more satisfying deep breath. (03/2016).  . Vitamin B 12 deficiency   . Vitamin D deficiency     Past Surgical History:  Procedure Laterality Date  . ABDOMINAL HYSTERECTOMY  1978  . BONE MARROW BIOPSY  07/24/14  . BREAST BIOPSY    . CARDIOVERSION N/A 10/16/2014   Procedure: CARDIOVERSION;  Surgeon: Lelon Perla, MD;  Location: [facility] ENDOSCOPY;  Service: Cardiovascular;  Laterality: N/A;  . CARDIOVERSION N/A 11/30/2014   Procedure: CARDIOVERSION;  Surgeon: Larey Dresser, MD;  Location: [facility] ENDOSCOPY;  Service: Cardiovascular;  Laterality: N/A;  . COLONOSCOPY  09/23/14   small hemorrhoids, otherwise normal (performed for IDA and heme+ stool)  . COLONOSCOPY    . MASS EXCISION Left 07/15/2015   Left lower lip mass--invasive SCC w/negative margins.  Procedure: EXCISION MASS;  Surgeon: Melissa Montane, MD;  Location: [facility];  Service: ENT;  Laterality: Left;  Wedge excision left lower lip mass  . NM MYOVIEW LTD  10/29/2014   medium size mild surgery defect in the mid anterior and apical anterior location suggestive of breast attenuation. No reversibility. LOW RISK  . PFTs  02/2015   Restriction with diffusion defect: cardiologist referred her to pulm to help interpret PFTs and decide whether she has amiodarone toxicity  . TEE WITHOUT CARDIOVERSION N/A 10/16/2014   Procedure: TRANSESOPHAGEAL ECHOCARDIOGRAM (TEE);  Surgeon: Lelon Perla, MD;  Location: [facility] ENDOSCOPY;  Service: Cardiovascular;  Laterality: N/A;  . TRANSTHORACIC ECHOCARDIOGRAM  07/23/14   mild LVH, EF 50-55%, grade 2 diast dysfxn  . UPPER GI ENDOSCOPY  09/23/2014   small hiatus hernia, nodules in stomach biopsied (chronic active erosive atrophic gastritis with intestinal metaplasia--no dysplasia or malignancy) otherwise normal    Outpatient Encounter Prescriptions as of 05/08/2016  Medication Sig  . ALPRAZolam (XANAX) 0.5 MG tablet 1 tab po qhs prn anxiety-related insomnia   . amiodarone (PACERONE) 200 MG tablet Take 1 tablet (200 mg total) by mouth daily.  Marland Kitchen apixaban (ELIQUIS) 5 MG TABS tablet Take 1 tablet (5 mg total) by mouth 2 (two) times daily.  . clonazePAM (KLONOPIN) 0.5 MG tablet Take 1/2 to 1 tablet by mouth two times daily as needed for shortness of breath  . cyanocobalamin (,VITAMIN B-12,) 1000 MCG/ML injection Inject 1 mL (1,000 mcg total) into the skin every 30 (thirty) days.  . ergocalciferol (VITAMIN D2) 50000 units capsule Take 1 capsule (50,000 Units total) by mouth every Sunday.  . ferrous sulfate 325 (65 FE) MG tablet Take 1 tablet (325 mg total) by mouth 2 (two) times daily with a meal.  . fish oil-omega-3 fatty acids 1000 MG capsule Take 2 g by mouth daily.  . furosemide (LASIX) 40 MG tablet TAKE ONE TABLET BY MOUTH TWICE DAILY  . levothyroxine (SYNTHROID, LEVOTHROID) 100 MCG tablet Take  1 tablet (100 mcg total) by mouth daily before breakfast.  . metoprolol succinate (TOPROL-XL) 25 MG 24 hr tablet Take 25 mg by mouth 2 (two) times daily.  . OXYGEN Inhale into the lungs. 1Lt at bedtime  . potassium chloride 20 MEQ TBCR Take 20 mEq by mouth daily.  . pravastatin (PRAVACHOL) 80 MG tablet TAKE ONE TABLET BY MOUTH DAILY  . prochlorperazine (COMPAZINE) 10 MG tablet Take 10 mg by mouth every 8 (eight) hours as needed for nausea or vomiting. Reported on 08/05/2015  . traMADol (ULTRAM) 50 MG tablet Take 1 tablet (50 mg total) by mouth 2 (two) times daily. For arthritis pain   No facility-administered encounter medications on file as of 05/08/2016.     Allergies  Allergen Reactions  . Iodinated Diagnostic Agents Rash and Other (See Comments)    CT Contrast    Immunization History  Administered Date(s) Administered  . Influenza,inj,Quad PF,36+ Mos 10/27/2014, 10/04/2015  . Pneumococcal Conjugate-13 02/07/2016  . Pneumococcal-Unspecified 01/23/2014  . Tdap 12/09/2014  . Zoster 03/19/2012    Current Medications, Allergies, Past Medical  History, Past Surgical History, Family History, and Social History were reviewed in [employer] record.   Review of Systems             All symptoms NEG except where BOLDED >>  Constitutional:  F/C/S, fatigue, anorexia, unexpected weight change. HEENT:  HA, visual changes, hearing loss, earache, nasal symptoms, sore throat, mouth sores, hoarseness. Resp:  cough, sputum, hemoptysis; SOB, tightness, wheezing. Cardio:  CP, palpit, DOE, orthopnea, edema. GI:  N/V/D/C, blood in stool; reflux, abd pain, distention, gas. GU:  dysuria, freq, urgency, hematuria, flank pain, voiding difficulty. MS:  joint pain, swelling, tenderness, decr ROM; neck pain, back pain, etc. Neuro:  HA, tremors, seizures, dizziness, syncope, weakness, numbness, gait abn. Skin:  suspicious lesions or skin rash. Heme:  adenopathy, bruising, bleeding. Psyche:  confusion, agitation, sleep disturbance, hallucinations, anxiety, depression suicidal.   Objective:   Physical Exam       Vital Signs:  Reviewed...   General:  WD, WN, 83 y/o WF in NAD; alert & oriented; pleasant & cooperative... HEENT:  [place]/AT; Conjunctiva- pink, Sclera- nonicteric, EOM-wnl, PERRLA, EACs-clear, TMs-wnl; NOSE-clear; THROAT-clear & wnl.  Neck:  Supple w/ fair ROM; no JVD; normal carotid impulses w/o bruits; no thyromegaly or nodules palpated; no lymphadenopathy.  Chest:  Clear to P & A; without wheezes, rales, or rhonchi heard. Heart:  Regular Rhythm; Gr1/6 SEM, without rubs or gallops detected. Abdomen:  Soft & nontender- no guarding or rebound; normal bowel sounds; no organomegaly or masses palpated. Ext:  decrROM; without deformities +arthritic changes; no varicose veins, venous insuffic, or edema;  Pulses intact w/o bruits. Neuro:  CNs II-XII intact; motor testing normal; sensory testing normal; gait normal & balance OK. Derm:  No lesions noted; no rash etc. Lymph:  No cervical, supraclavicular, axillary, or inguinal  adenopathy palpated.   Assessment:      IMP >>     S/P resection of invasive squamous cell ca on left lower lip by DrByers6/2017    DYSPNEA> multifactorial- from AF w/ rvr, diastolicCHF, decr DLCO on PFT, anemia, anxiety...    Abnormal PFT w/ norm airflow, norm lung volumes, air trapping & decr DLCO=56%>  This is a baseline PFT study done 02/2015 & Amio was started 10/2014; DLCO also affected by lung vol & anemia...    CT Chest 10/2014 had some CHF w/ interstitial prominence, sm bilat effusions, but improved adenopathy &  diminished pulm nodules s/o chemotherapy for lymphoma    HBP> on MetopER25Bid, Lasix40AM&20PM, K20    Hx PAF> on Eliquis5Bid, Amiodarone200; she is holding NSR on these meds...    Chr diastolic CHF> on meds above; last BNP was 11/2014= 378...    Hyperlipidemia> on Prav80 + Fish Oil    Hypothyroid> on Synthroid100    Osteopenia and T12 compression    Hx non-hodgkins lymphoma> dx 6/16 & treated w/ 4 cycles chemoRx (last 10/28/14)    Anemia> on FeBid since 6/16 & followed by DrSherrill; last CBC 03/11/15 showed Hg=10.6, MCV=81; will discuss w/ DrSherrill re: poss IV iron therapy & she definitely needs to get back on her B12 supplementation.    Hx B12 defic> she is not currently taking B12 shots or supplements & B12 level 04/2015 = 210; rec to restart her B12 shots 10563mg/mo (admit by her RN daughter)...   PLAN >>   05/10/15>   SAadyaindicates that her DOE is improving, getting better "It helps me to walk" she notes;  We are indered by the timing of her studies- starting on Amio 10/2014, last CTChest 10/2014, 1st PFT 02/2015, her chronic diastolic CHF, mild anemia, etc... She will continue same meds, continue to exercise, f/u w/ Oncology & we will recheck pt in 6 weeks...  06/29/15>   She is rec to continue current meds; restart B12 shots monthly per her daughter who is an RTherapist, sports refer to ENT for Bx/ excision of left lower lip lesion. 10/04/15>   Note- pt has O2 per DrSherrill & states  "I use it at night when I need it for SOB"; OK 2017 Flu vaccine today; we reviewed low carb diet & incr exercise program... we plan rov recheck 646mo/19/18>   We discussed how this dyspnea is diff than her prev SOB w/ AFib & CHF- esp how she feels SOB w/ ADLs (can't get a deep breath "IN") but able to work-out on her exercise bike Bid w/o difficulty;  In this regard I have rec a trial of KLONOPIN 0.'5mg'$  1/2 to 1 tab po Bid to relax her chest wall muscles and allow a deeper more satisfying breath; we plan rov recheck to document response in 63m80mo4/16/18>   Klonopin is really helping her dyspnea; today c/o right knee pain & we'll try Tramadol + refer to Ortho...   Plan:     Patient's Medications  New Prescriptions   TRAMADOL (ULTRAM) 50 MG TABLET    Take 1 tablet (50 mg total) by mouth 2 (two) times daily. For arthritis pain  Previous Medications   ALPRAZOLAM (XANAX) 0.5 MG TABLET    1 tab po qhs prn anxiety-related insomnia   AMIODARONE (PACERONE) 200 MG TABLET    Take 1 tablet (200 mg total) by mouth daily.   APIXABAN (ELIQUIS) 5 MG TABS TABLET    Take 1 tablet (5 mg total) by mouth 2 (two) times daily.   CLONAZEPAM (KLONOPIN) 0.5 MG TABLET    Take 1/2 to 1 tablet by mouth two times daily as needed for shortness of breath   CYANOCOBALAMIN (,VITAMIN B-12,) 1000 MCG/ML INJECTION    Inject 1 mL (1,000 mcg total) into the skin every 30 (thirty) days.   ERGOCALCIFEROL (VITAMIN D2) 50000 UNITS CAPSULE    Take 1 capsule (50,000 Units total) by mouth every Sunday.   FERROUS SULFATE 325 (65 FE) MG TABLET    Take 1 tablet (325 mg total) by mouth 2 (two) times daily with a  meal.   FISH OIL-OMEGA-3 FATTY ACIDS 1000 MG CAPSULE    Take 2 g by mouth daily.   FUROSEMIDE (LASIX) 40 MG TABLET    TAKE ONE TABLET BY MOUTH TWICE DAILY   LEVOTHYROXINE (SYNTHROID, LEVOTHROID) 100 MCG TABLET    Take 1 tablet (100 mcg total) by mouth daily before breakfast.   METOPROLOL SUCCINATE (TOPROL-XL) 25 MG 24 HR TABLET    Take  25 mg by mouth 2 (two) times daily.   OXYGEN    Inhale into the lungs. 1Lt at bedtime   POTASSIUM CHLORIDE 20 MEQ TBCR    Take 20 mEq by mouth daily.   PRAVASTATIN (PRAVACHOL) 80 MG TABLET    TAKE ONE TABLET BY MOUTH DAILY   PROCHLORPERAZINE (COMPAZINE) 10 MG TABLET    Take 10 mg by mouth every 8 (eight) hours as needed for nausea or vomiting. Reported on 08/05/2015  Modified Medications   No medications on file  Discontinued Medications   No medications on file

## 2016-05-08 NOTE — Patient Instructions (Signed)
Today we updated your med list in our EPIC system...    Continue your current medications the same...       Including the [facility] 0.5mg  twice daily...  Try the new TRAMADOL 50mg  one tab up to two times daily as needed for arthritis pain...  We will arrange for an ORTHOPEDIC evaluation for your knee arthritis...  Continue your exercise bike program...  Call for any questions...  Let's plan a follow up visit in 80mo, sooner if needed for problems.Marland KitchenMarland Kitchen

## 2016-05-09 ENCOUNTER — Other Ambulatory Visit: Payer: Self-pay | Admitting: Cardiology

## 2016-05-11 DIAGNOSIS — I10 Essential (primary) hypertension: Secondary | ICD-10-CM | POA: Diagnosis not present

## 2016-05-11 DIAGNOSIS — J9600 Acute respiratory failure, unspecified whether with hypoxia or hypercapnia: Secondary | ICD-10-CM | POA: Diagnosis not present

## 2016-05-15 ENCOUNTER — Ambulatory Visit (INDEPENDENT_AMBULATORY_CARE_PROVIDER_SITE_OTHER): Payer: PPO | Admitting: Cardiology

## 2016-05-15 ENCOUNTER — Encounter: Payer: Self-pay | Admitting: Cardiology

## 2016-05-15 VITALS — BP 152/55 | HR 58 | Ht 65.0 in | Wt 202.4 lb

## 2016-05-15 DIAGNOSIS — Z79899 Other long term (current) drug therapy: Secondary | ICD-10-CM

## 2016-05-15 DIAGNOSIS — I5032 Chronic diastolic (congestive) heart failure: Secondary | ICD-10-CM

## 2016-05-15 DIAGNOSIS — I10 Essential (primary) hypertension: Secondary | ICD-10-CM

## 2016-05-15 DIAGNOSIS — I480 Paroxysmal atrial fibrillation: Secondary | ICD-10-CM

## 2016-05-15 DIAGNOSIS — M170 Bilateral primary osteoarthritis of knee: Secondary | ICD-10-CM | POA: Diagnosis not present

## 2016-05-15 MED ORDER — APIXABAN 5 MG PO TABS: 5.0000 mg | ORAL_TABLET | Freq: Two times a day (BID) | ORAL | 0 refills | Status: DC

## 2016-05-15 NOTE — Progress Notes (Signed)
PCP: Tammi Sou, MD  Clinic Note: Chief Complaint  Patient presents with  . Follow-up  . Atrial Fibrillation    Edema: some in her knees and legs     HPI: Amanda Sellers is a 83 y.o. female with a PMH below who presents today for 9 month follow-up for A. fib with mild diastolic heart failure. She remains on amiodarone for rhythm control and ELIQUIS for anticoagulation.Lenn Cal Doebler was last seen on July 2017 January 8 at the [place] [facility]  Recent Hospitalizations: none; she did have a skin cancer removed from her mouth on the left side recently.  She is currently considered to be in remission after chemotherapy for non-Hodgkin's lymphoma diagnosed by bone marrow biopsy back in July 2016.  Studies Reviewed: none  Interval History: Jamaris presents here today along with her daughter for routine follow-up. She has been doing fairly well with no major issues. She had a few episodes of difficulty with breathing and shaking episodes where she felt her heart may be going fast. She saw her PCP and was started on Klonopin  (by her pulmonologist Dr. Lenna Gilford) for what was thought to be maybe anxiety. That seems to have worked Recruitment consultant, she has not had any further episodes. She notes occasional leg edema mostly in her ankles but mostly she has swollen knees from arthritis pains. It makes it very hard for her to walk because her knees bother her. She is hoping to have knee injections.  Besides these episodes of shortness of breath, she really has not noted any significant dyspnea with either rest or exertion. No PND or orthopnea. She actually notes that walking helps her feel better with breathing movement does make it worse. She is mostly limited by her knee pain. She denies any chest tightness or pressure associated with it ever since starting Klonopin.  No r syncope/near syncope. No TIA/amaurosis fugax symptoms. No melena, hematochezia, hematuria, or epstaxis. No  claudication.  ROS: A comprehensive was performed. Review of Systems  Constitutional: Negative for malaise/fatigue.  HENT: Negative for nosebleeds.   Eyes: Negative for blurred vision.  Respiratory: Negative for cough, shortness of breath and wheezing.   Cardiovascular: Positive for leg swelling (mild ankle).  Gastrointestinal: Negative for blood in stool and melena.  Genitourinary: Negative for hematuria.  Musculoskeletal: Positive for joint pain (both knees hurt & swollen - just give Rx for Ultram).  Skin: Negative.   Neurological: Positive for dizziness (only occasionally). Negative for focal weakness and headaches.  Endo/Heme/Allergies: Negative for environmental allergies. Does not bruise/bleed easily.  Psychiatric/Behavioral: The patient is nervous/anxious (noted dyspnea & difficulty sleepng- better with Klonopin).   All other systems reviewed and are negative.   Past Medical History:  Diagnosis Date  . Anticoagulation adequate, Eliquis 10/16/2014  . Anxiety    pt denies  . Atrial fibrillation ([place])    With RVR; elec cardioversion 11/30/14.  On Amio since 10/2014; Dr. Lenna Gilford following her from pulm standpoint regarding this med.  . Chronic diastolic congestive heart failure ([place]) 14/97/0263   Complicated by atrial fibrillation; Echo 07/23/14: mild LVH, EF 50-55%, grade 2 diast dysfxn  . Chronic renal insufficiency, stage III (moderate)    GFR @ 50  . COPD (chronic obstructive pulmonary disease) ([place])    recently noted on Xray, does not have any problems  . Family history of adverse reaction to anesthesia    daughter has difficulty waking up  . Follicular lymphoma ([place])    Non Hodgkins  B cell lymphoma; s/p chemo summer 2016--in remission as of 03/2016.  Marland Kitchen HLD (hyperlipidemia)     06/2014  . HTN (hypertension)   . Hypothyroidism   . Lip cancer    Dr. Janace Hoard excised this: invasive SCC--no sign of cancer at ENT f/u 10/2015  . Microcytic anemia    transfused 3 U total in hosp  07/2014  . Olecranon bursitis of right elbow 02/2015  . Pancytopenia due to antineoplastic chemotherapy ([place])   . Positive occult stool blood test 08/08/14   Endoscopies ok 08/2014  . Pulmonary metastases ([place]) 07/24/2014  . Shortness of breath dyspnea    with exertion.  Dr. Lenna Gilford started her on clonazepam to try to relax her chest wall and allow more satisfying deep breath. (03/2016).  . Vitamin B 12 deficiency   . Vitamin D deficiency     Past Surgical History:  Procedure Laterality Date  . ABDOMINAL HYSTERECTOMY  1978  . BONE MARROW BIOPSY  07/24/14  . BREAST BIOPSY    . CARDIOVERSION N/A 10/16/2014   Procedure: CARDIOVERSION;  Surgeon: Lelon Perla, MD;  Location: [facility] ENDOSCOPY;  Service: Cardiovascular;  Laterality: N/A;  . CARDIOVERSION N/A 11/30/2014   Procedure: CARDIOVERSION;  Surgeon: Larey Dresser, MD;  Location: [facility] ENDOSCOPY;  Service: Cardiovascular;  Laterality: N/A;  . COLONOSCOPY  09/23/14   small hemorrhoids, otherwise normal (performed for IDA and heme+ stool)  . COLONOSCOPY    . MASS EXCISION Left 07/15/2015   Left lower lip mass--invasive SCC w/negative margins.  Procedure: EXCISION MASS;  Surgeon: Melissa Montane, MD;  Location: [place];  Service: ENT;  Laterality: Left;  Wedge excision left lower lip mass  . NM MYOVIEW LTD  10/29/2014   medium size mild surgery defect in the mid anterior and apical anterior location suggestive of breast attenuation. No reversibility. LOW RISK  . PFTs  02/2015   Restriction with diffusion defect: cardiologist referred her to pulm to help interpret PFTs and decide whether she has amiodarone toxicity  . TEE WITHOUT CARDIOVERSION N/A 10/16/2014   Procedure: TRANSESOPHAGEAL ECHOCARDIOGRAM (TEE);  Surgeon: Lelon Perla, MD;  Location: [facility] ENDOSCOPY;  Service: Cardiovascular;  Laterality: N/A;  . TRANSTHORACIC ECHOCARDIOGRAM  07/23/14   mild LVH, EF 50-55%, grade 2 diast dysfxn  . UPPER GI ENDOSCOPY  09/23/2014   small hiatus hernia, nodules in  stomach biopsied (chronic active erosive atrophic gastritis with intestinal metaplasia--no dysplasia or malignancy) otherwise normal    Current Meds  Medication Sig  . ALPRAZolam (XANAX) 0.5 MG tablet 1 tab po qhs prn anxiety-related insomnia  . amiodarone (PACERONE) 200 MG tablet Take 1 tablet (200 mg total) by mouth daily.  Marland Kitchen apixaban (ELIQUIS) 5 MG TABS tablet Take 1 tablet (5 mg total) by mouth 2 (two) times daily.  . clonazePAM (KLONOPIN) 0.5 MG tablet Take 1/2 to 1 tablet by mouth two times daily as needed for shortness of breath  . cyanocobalamin (,VITAMIN B-12,) 1000 MCG/ML injection Inject 1 mL (1,000 mcg total) into the skin every 30 (thirty) days.  . ergocalciferol (VITAMIN D2) 50000 units capsule Take 1 capsule (50,000 Units total) by mouth every Sunday.  . ferrous sulfate 325 (65 FE) MG tablet Take 1 tablet (325 mg total) by mouth 2 (two) times daily with a meal.  . fish oil-omega-3 fatty acids 1000 MG capsule Take 2 g by mouth daily.  . furosemide (LASIX) 40 MG tablet TAKE ONE TABLET BY MOUTH TWICE DAILY  . levothyroxine (SYNTHROID, LEVOTHROID) 100 MCG tablet  Take 1 tablet (100 mcg total) by mouth daily before breakfast.  . metoprolol succinate (TOPROL-XL) 25 MG 24 hr tablet Take 25 mg by mouth 2 (two) times daily.  . OXYGEN Inhale into the lungs. 1Lt at bedtime  . potassium chloride 20 MEQ TBCR Take 20 mEq by mouth daily.  . pravastatin (PRAVACHOL) 80 MG tablet TAKE ONE TABLET BY MOUTH DAILY  . prochlorperazine (COMPAZINE) 10 MG tablet Take 10 mg by mouth every 8 (eight) hours as needed for nausea or vomiting. Reported on 08/05/2015  . traMADol (ULTRAM) 50 MG tablet Take 1 tablet (50 mg total) by mouth 2 (two) times daily. For arthritis pain  . [DISCONTINUED] apixaban (ELIQUIS) 5 MG TABS tablet Take 1 tablet (5 mg total) by mouth 2 (two) times daily.    Allergies  Allergen Reactions  . Iodinated Diagnostic Agents Rash and Other (See Comments)    CT Contrast    Social  History   Social History  . Marital status: Married    Spouse name: N/A  . Number of children: 8  . Years of education: N/A   Occupational History  . retired    Social History Main Topics  . Smoking status: Never Smoker  . Smokeless tobacco: Never Used  . Alcohol use No  . Drug use: No  . Sexual activity: Not Asked   Other Topics Concern  . None   Social History Narrative   Widowed, has 8 children.   Orig from [place], now lives in [place].   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   No tob/alc/drugs.    family history includes Diabetes in her daughter; Lymphoma in her sister; Melanoma in her mother; Stomach cancer in her father.  Wt Readings from Last 3 Encounters:  05/15/16 202 lb 6.4 oz (91.8 kg)  05/08/16 201 lb 8 oz (91.4 kg)  04/21/16 197 lb 11.2 oz (89.7 kg)    PHYSICAL EXAM BP (!) 152/55   Pulse (!) 58   Ht 5' 5" (1.651 m)   Wt 202 lb 6.4 oz (91.8 kg)   SpO2 97%   BMI 33.68 kg/m  General appearance: alert, cooperative, appears stated age, no distress and Mildly  obese. Well-nourished well-groomed.  Neck: no adenopathy, no carotid bruit and no JVD Lungs:Mostly CTA B, but with mild diffuse interstitial sounds., normal percussion bilaterally and non-labored. Good air movement.  Heart:RRR, normal S1 and S2.  nondisplaced PMI. Soft 1-2/6 SEM at RUSB. No R/G. Nondisplaced PMI Abdomen: soft, non-tender; bowel sounds normal; no masses,  no organomegaly; no HJR  Extremities: extremities normal, atraumatic, no cyanosis, and edema 1+ to the knees (she is wearing stockings but not support stockings)  Pulses: 2+ and symmetric;  Skin: mobility and turgor normal, no abnormal pigmentation, no evidence of bleeding or bruising and Mild telangiectasia & varicosities/spider veins noted in lower extremities  Neurologic: Mental status: Alert & oriented x 3, thought content appropriate; pleasant mood and affect    Adult ECG  Report -n/a  Other studies Reviewed: Additional studies/ records that were reviewed today include:  Recent Labs:  Checked by PCP - due in May   ASSESSMENT / PLAN: Problem List Items Addressed This Visit    Chronic diastolic CHF (congestive heart failure), NYHA class 2 (HCC) (Chronic)    Currently stable on current dose of twice a day Lasix. She is not having to use any additional doses. She does have mild pedal edema and I think  we can probably improve that wearing support stockings. She is already wearing stockings now. She has compression hose at home, but I felt may be too hard to get on. But either way I think either light compression hose or support stockings would be beneficial.      Relevant Medications   apixaban (ELIQUIS) 5 MG TABS tablet   Essential hypertension (Chronic)    Her blood pressure seems to be higher, but on other evaluations that I seen it be better controlled. I think this current blood pressure at the upper level one of my range for mild permissive hypertension to avoid orthostasis. I am reluctant to add a new blood pressure medication.      Relevant Medications   apixaban (ELIQUIS) 5 MG TABS tablet   On amiodarone therapy (Chronic)    She continues to require annual eye exams as well as thyroid levels and LFTs. Will defer evaluation of PFTs to Dr. Lenna Gilford.      Paroxysmal atrial fibrillation with RVR ([place]): CHA2DS2-VASc Score 4 (Chronic)    Difficult scenario with concerning changes in PFTs with DLCO. Unfortunate she is extremely symptomatic when she goes in A. fib, therefore we'll therefore maintaining her with current dose of amiodarone. We could consider reducing her dose, but I'm reluctant to do so this point. She is also on twice a day Toprol for additional rate control and seems to be well-controlled at this point -- usually when she goes into A. fib she is in A. fib RVR. She seems to be maintaining sinus rhythm. She is on ELIQUIS for anticoagulation       Relevant Medications   apixaban (ELIQUIS) 5 MG TABS tablet      Current medicines are reviewed at length with the patient today. (+/- concerns) n/a The following changes have been made: n/a  Patient Instructions  NO CHANGE WITH CURRENT MEDICATIONS   Your physician wants you to follow-up in [place] [facility]. You will receive a reminder letter in the mail two months in advance. If you don't receive a letter, please call our office to schedule the follow-up appointment.    If you need a refill on your cardiac medications before your next appointment, please call your pharmacy.    Studies Ordered:   No orders of the defined types were placed in this encounter.     Glenetta Hew, M.D., M.S. Interventional Cardiologist   Pager # [phone] Phone # [phone] [address]. [place], [place] [address]

## 2016-05-15 NOTE — Patient Instructions (Signed)
NO CHANGE WITH CURRENT MEDICATIONS   Your physician wants you to follow-up in 6 MONTHS WITH DR HARDING.  You will receive a reminder letter in the mail two months in advance. If you don't receive a letter, please call our office to schedule the follow-up appointment.  If you need a refill on your cardiac medications before your next appointment, please call your pharmacy.   

## 2016-05-17 ENCOUNTER — Encounter: Payer: Self-pay | Admitting: Cardiology

## 2016-05-17 ENCOUNTER — Telehealth: Payer: Self-pay | Admitting: Oncology

## 2016-05-17 NOTE — Assessment & Plan Note (Signed)
Her blood pressure seems to be higher, but on other evaluations that I seen it be better controlled. I think this current blood pressure at the upper level one of my range for mild permissive hypertension to avoid orthostasis. I am reluctant to add a new blood pressure medication.

## 2016-05-17 NOTE — Assessment & Plan Note (Addendum)
Difficult scenario with concerning changes in PFTs with DLCO. Unfortunate she is extremely symptomatic when she goes in A. fib, therefore we'll therefore maintaining her with current dose of amiodarone. We could consider reducing her dose, but I'm reluctant to do so this point. She is also on twice a day Toprol for additional rate control and seems to be well-controlled at this point -- usually when she goes into A. fib she is in A. fib RVR. She seems to be maintaining sinus rhythm. She is on ELIQUIS for anticoagulation

## 2016-05-17 NOTE — Telephone Encounter (Signed)
Faxed FMLA papers for patient's daughter Amanda Sellers to [place] fax [phone] on 05/04/16 and scanned copy into epic under media tab

## 2016-05-17 NOTE — Assessment & Plan Note (Signed)
She continues to require annual eye exams as well as thyroid levels and LFTs. Will defer evaluation of PFTs to Dr. Lenna Gilford.

## 2016-05-17 NOTE — Assessment & Plan Note (Signed)
Currently stable on current dose of twice a day Lasix. She is not having to use any additional doses. She does have mild pedal edema and I think we can probably improve that wearing support stockings. She is already wearing stockings now. She has compression hose at home, but I felt may be too hard to get on. But either way I think either light compression hose or support stockings would be beneficial.

## 2016-05-18 ENCOUNTER — Encounter: Payer: Self-pay | Admitting: Family Medicine

## 2016-05-19 ENCOUNTER — Encounter: Payer: Self-pay | Admitting: Family Medicine

## 2016-06-09 ENCOUNTER — Encounter: Payer: PPO | Admitting: Family Medicine

## 2016-06-10 DIAGNOSIS — J9600 Acute respiratory failure, unspecified whether with hypoxia or hypercapnia: Secondary | ICD-10-CM | POA: Diagnosis not present

## 2016-06-10 DIAGNOSIS — I10 Essential (primary) hypertension: Secondary | ICD-10-CM | POA: Diagnosis not present

## 2016-06-27 DIAGNOSIS — M25562 Pain in left knee: Secondary | ICD-10-CM | POA: Diagnosis not present

## 2016-06-27 DIAGNOSIS — M25561 Pain in right knee: Secondary | ICD-10-CM | POA: Diagnosis not present

## 2016-06-27 DIAGNOSIS — M170 Bilateral primary osteoarthritis of knee: Secondary | ICD-10-CM | POA: Diagnosis not present

## 2016-07-03 ENCOUNTER — Ambulatory Visit (INDEPENDENT_AMBULATORY_CARE_PROVIDER_SITE_OTHER): Payer: PPO | Admitting: Family Medicine

## 2016-07-03 ENCOUNTER — Encounter: Payer: Self-pay | Admitting: Family Medicine

## 2016-07-03 VITALS — BP 137/59 | HR 43 | Temp 97.5°F | Resp 16 | Ht 63.5 in | Wt 196.2 lb

## 2016-07-03 DIAGNOSIS — E039 Hypothyroidism, unspecified: Secondary | ICD-10-CM

## 2016-07-03 DIAGNOSIS — D649 Anemia, unspecified: Secondary | ICD-10-CM | POA: Diagnosis not present

## 2016-07-03 DIAGNOSIS — G3184 Mild cognitive impairment, so stated: Secondary | ICD-10-CM

## 2016-07-03 DIAGNOSIS — E7800 Pure hypercholesterolemia, unspecified: Secondary | ICD-10-CM

## 2016-07-03 DIAGNOSIS — N183 Chronic kidney disease, stage 3 unspecified: Secondary | ICD-10-CM

## 2016-07-03 DIAGNOSIS — Z0000 Encounter for general adult medical examination without abnormal findings: Secondary | ICD-10-CM

## 2016-07-03 DIAGNOSIS — R251 Tremor, unspecified: Secondary | ICD-10-CM | POA: Diagnosis not present

## 2016-07-03 LAB — CBC WITH DIFFERENTIAL/PLATELET
Basophils Absolute: 0.1 10*3/uL (ref 0.0–0.1)
Basophils Relative: 0.9 % (ref 0.0–3.0)
Eosinophils Absolute: 0.1 10*3/uL (ref 0.0–0.7)
Eosinophils Relative: 1.3 % (ref 0.0–5.0)
HCT: 37.7 % (ref 36.0–46.0)
Hemoglobin: 12.1 g/dL (ref 12.0–15.0)
Lymphocytes Relative: 14.5 % (ref 12.0–46.0)
Lymphs Abs: 0.9 10*3/uL (ref 0.7–4.0)
MCHC: 32 g/dL (ref 30.0–36.0)
MCV: 81.2 fl (ref 78.0–100.0)
Monocytes Absolute: 0.6 10*3/uL (ref 0.1–1.0)
Monocytes Relative: 10 % (ref 3.0–12.0)
Neutro Abs: 4.5 10*3/uL (ref 1.4–7.7)
Neutrophils Relative %: 73.3 % (ref 43.0–77.0)
Platelets: 214 10*3/uL (ref 150.0–400.0)
RBC: 4.65 Mil/uL (ref 3.87–5.11)
RDW: 18.4 % — ABNORMAL HIGH (ref 11.5–15.5)
WBC: 6.1 10*3/uL (ref 4.0–10.5)

## 2016-07-03 LAB — TSH: TSH: 4.17 u[IU]/mL (ref 0.35–4.50)

## 2016-07-03 LAB — BASIC METABOLIC PANEL
BUN: 18 mg/dL (ref 6–23)
CO2: 30 mEq/L (ref 19–32)
Calcium: 9.4 mg/dL (ref 8.4–10.5)
Chloride: 101 mEq/L (ref 96–112)
Creatinine, Ser: 0.93 mg/dL (ref 0.40–1.20)
GFR: 61.35 mL/min (ref >60.00)
Glucose, Bld: 121 mg/dL — ABNORMAL HIGH (ref 70–99)
Potassium: 4.1 mEq/L (ref 3.5–5.1)
Sodium: 143 mEq/L (ref 135–145)

## 2016-07-03 LAB — LIPID PANEL
Cholesterol: 199 mg/dL (ref 0–200)
HDL: 64.4 mg/dL (ref >39.00)
LDL Cholesterol: 100 mg/dL — ABNORMAL HIGH (ref 0–99)
NonHDL: 134.98
Total CHOL/HDL Ratio: 3
Triglycerides: 176 mg/dL — ABNORMAL HIGH (ref 0.0–149.0)
VLDL: 35.2 mg/dL (ref 0.0–40.0)

## 2016-07-03 NOTE — Progress Notes (Signed)
Office Note 07/03/2016  CC:  Chief Complaint  Patient presents with  . Annual Exam    Pt is fasting.     HPI:  Amanda Sellers is a 83 y.o. White female who is here accompanied by her granddaughter for annual health maintenance exam. I last saw her 5 mo ago for HLD, hypothyroidism, CRI stage III, and hypertension. Recent cardiology f/u 05/15/16: no changes in med regimen.  PFT's for amiodarone monitoring --this is being followed by Dr. Lenna Gilford.  She sees him next on 08/07/16.  Feeling well lately except for some knee arthritis for which she is getting injections.    Also, last few months, granddaughter says pt has had some brief periods of confusion--like short term memory issues that are brief. These are not affecting her functioning per granddaughter's report today.  Has no brain imaging in EMR from the past.  Next appt with Dr. Benay Spice is 04/07/38--GQ of follicular lymphoma in remission. She attends well, does not wander, does not hallucinate.  No excessive repeating herself or asking questions she has asked many times.  Fairly up to date with current events that she cares about. Also having some tremor last few months, R arm > L arm.  Has it just when her arms are still, laying on lap for instance. She uses a walker and has chronic knee pain so bradykinesia and balance are hard to assess with her.  No mask-like facies. Opens jars fine, can roll over in bed fine.   Past Medical History:  Diagnosis Date  . Anticoagulation adequate, Eliquis 10/16/2014  . Anxiety    pt denies  . Atrial fibrillation ([place])    With RVR; elec cardioversion 11/30/14.  On Amio since 10/2014; Dr. Lenna Gilford following her from pulm standpoint regarding this med.  . Chronic diastolic congestive heart failure ([place]) 67/61/9509   Complicated by atrial fibrillation; Echo 07/23/14: mild LVH, EF 50-55%, grade 2 diast dysfxn  . Chronic renal insufficiency, stage III (moderate)    GFR @ 50  . COPD (chronic obstructive  pulmonary disease) ([place])    recently noted on Xray, does not have any problems  . Family history of adverse reaction to anesthesia    daughter has difficulty waking up  . Follicular lymphoma ([place])    Non Hodgkins B cell lymphoma; s/p chemo summer 2016--in remission as of 03/2016.  Marland Kitchen HLD (hyperlipidemia)     06/2014  . HTN (hypertension)   . Hypothyroidism   . Lip cancer    Dr. Janace Hoard excised this: invasive SCC--no sign of cancer at ENT f/u 10/2015  . Microcytic anemia    transfused 3 U total in hosp 07/2014  . Olecranon bursitis of right elbow 02/2015  . Pancytopenia due to antineoplastic chemotherapy ([place])   . Positive occult stool blood test 08/08/14   Endoscopies ok 08/2014  . Primary osteoarthritis of both knees 04/2016   Dr. Gladstone Lighter: bone on bone--to get euflexxa to both knees.  . Pulmonary metastases ([place]) 07/24/2014  . Shortness of breath dyspnea    with exertion.  Dr. Lenna Gilford started her on clonazepam to try to relax her chest wall and allow more satisfying deep breath. (03/2016).  . Vitamin B 12 deficiency   . Vitamin D deficiency     Past Surgical History:  Procedure Laterality Date  . ABDOMINAL HYSTERECTOMY  1978  . BONE MARROW BIOPSY  07/24/14  . BREAST BIOPSY    . CARDIOVERSION N/A 10/16/2014   Procedure: CARDIOVERSION;  Surgeon: Lelon Perla,  MD;  Location: [facility];  Service: Cardiovascular;  Laterality: N/A;  . CARDIOVERSION N/A 11/30/2014   Procedure: CARDIOVERSION;  Surgeon: Larey Dresser, MD;  Location: [facility] ENDOSCOPY;  Service: Cardiovascular;  Laterality: N/A;  . COLONOSCOPY  09/23/14   small hemorrhoids, otherwise normal (performed for IDA and heme+ stool)  . COLONOSCOPY    . MASS EXCISION Left 07/15/2015   Left lower lip mass--invasive SCC w/negative margins.  Procedure: EXCISION MASS;  Surgeon: Melissa Montane, MD;  Location: [facility];  Service: ENT;  Laterality: Left;  Wedge excision left lower lip mass  . NM MYOVIEW LTD  10/29/2014   medium size mild surgery defect  in the mid anterior and apical anterior location suggestive of breast attenuation. No reversibility. LOW RISK  . PFTs  02/2015   Restriction with diffusion defect: cardiologist referred her to pulm to help interpret PFTs and decide whether she has amiodarone toxicity  . TEE WITHOUT CARDIOVERSION N/A 10/16/2014   Procedure: TRANSESOPHAGEAL ECHOCARDIOGRAM (TEE);  Surgeon: Lelon Perla, MD;  Location: [facility] ENDOSCOPY;  Service: Cardiovascular;  Laterality: N/A;  . TRANSTHORACIC ECHOCARDIOGRAM  07/23/14   mild LVH, EF 50-55%, grade 2 diast dysfxn  . UPPER GI ENDOSCOPY  09/23/2014   small hiatus hernia, nodules in stomach biopsied (chronic active erosive atrophic gastritis with intestinal metaplasia--no dysplasia or malignancy) otherwise normal    Family History  Problem Relation Age of Onset  . Melanoma Mother   . Stomach cancer Father   . Lymphoma Sister   . Diabetes Daughter     Social History   Social History  . Marital status: Married    Spouse name: N/A  . Number of children: 8  . Years of education: N/A   Occupational History  . retired    Social History Main Topics  . Smoking status: Never Smoker  . Smokeless tobacco: Never Used  . Alcohol use No  . Drug use: No  . Sexual activity: Not on file   Other Topics Concern  . Not on file   Social History Narrative   Widowed, has 8 children.   Orig from [place], now lives in [place].   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   No tob/alc/drugs.    Outpatient Medications Prior to Visit  Medication Sig Dispense Refill  . ALPRAZolam (XANAX) 0.5 MG tablet 1 tab po qhs prn anxiety-related insomnia 90 tablet 1  . amiodarone (PACERONE) 200 MG tablet Take 1 tablet (200 mg total) by mouth daily. 180 tablet 3  . apixaban (ELIQUIS) 5 MG TABS tablet Take 1 tablet (5 mg total) by mouth 2 (two) times daily. 28 tablet 0  . clonazePAM (KLONOPIN) 0.5 MG tablet Take 1/2 to 1 tablet by mouth two  times daily as needed for shortness of breath 60 tablet 5  . cyanocobalamin (,VITAMIN B-12,) 1000 MCG/ML injection Inject 1 mL (1,000 mcg total) into the skin every 30 (thirty) days. 1 mL 12  . ergocalciferol (VITAMIN D2) 50000 units capsule Take 1 capsule (50,000 Units total) by mouth every Sunday. 12 capsule 1  . ferrous sulfate 325 (65 FE) MG tablet Take 1 tablet (325 mg total) by mouth 2 (two) times daily with a meal. 30 tablet 1  . fish oil-omega-3 fatty acids 1000 MG capsule Take 2 g by mouth daily.    . furosemide (LASIX) 40 MG tablet TAKE ONE TABLET BY MOUTH TWICE DAILY 60 tablet 1  . levothyroxine (SYNTHROID, LEVOTHROID) 100  MCG tablet Take 1 tablet (100 mcg total) by mouth daily before breakfast. 90 tablet 3  . metoprolol succinate (TOPROL-XL) 25 MG 24 hr tablet Take 25 mg by mouth 2 (two) times daily.    . OXYGEN Inhale into the lungs. 1Lt at bedtime    . potassium chloride 20 MEQ TBCR Take 20 mEq by mouth daily. 30 tablet 3  . pravastatin (PRAVACHOL) 80 MG tablet TAKE ONE TABLET BY MOUTH DAILY 90 tablet 3  . prochlorperazine (COMPAZINE) 10 MG tablet Take 10 mg by mouth every 8 (eight) hours as needed for nausea or vomiting. Reported on 08/05/2015    . traMADol (ULTRAM) 50 MG tablet Take 1 tablet (50 mg total) by mouth 2 (two) times daily. For arthritis pain 60 tablet 5   No facility-administered medications prior to visit.     Allergies  Allergen Reactions  . Iodinated Diagnostic Agents Rash and Other (See Comments)    CT Contrast    ROS Review of Systems  Constitutional: Negative for appetite change, chills, fatigue and fever.  HENT: Negative for congestion, dental problem, ear pain and sore throat.   Eyes: Negative for discharge, redness and visual disturbance.  Respiratory: Negative for cough, chest tightness, shortness of breath and wheezing.   Cardiovascular: Negative for chest pain, palpitations and leg swelling.  Gastrointestinal: Negative for abdominal pain, blood in  stool, diarrhea, nausea and vomiting.  Genitourinary: Negative for difficulty urinating, dysuria, flank pain, frequency, hematuria and urgency.  Musculoskeletal: Positive for gait problem (requires rolling walker due to chronic knee pain bilat). Negative for arthralgias, back pain, joint swelling, myalgias and neck stiffness.  Skin: Negative for pallor and rash.  Neurological: Positive for tremors. Negative for dizziness, speech difficulty, weakness and headaches.  Hematological: Negative for adenopathy. Does not bruise/bleed easily.  Psychiatric/Behavioral: Positive for confusion (see hpi). Negative for sleep disturbance. The patient is not nervous/anxious.     PE; Blood pressure (!) 137/59, pulse (!) 43, temperature 97.5 F (36.4 C), temperature source Oral, resp. rate 16, height 5' 3.5" (1.613 m), weight 196 lb 4 oz (89 kg), SpO2 99 %. Gen: Alert, well appearing.  Patient is oriented to person, place, time, and situation. AFFECT: pleasant, lucid thought and speech. ENT: Ears: EACs clear, normal epithelium.  TMs with good light reflex and landmarks bilaterally.  Eyes: no injection, icteris, swelling, or exudate.  EOMI, PERRLA. Nose: no drainage or turbinate edema/swelling.  No injection or focal lesion.  Mouth: lips without lesion/swelling.  Oral mucosa pink and moist.  Dentition intact and without obvious caries or gingival swelling.  Oropharynx without erythema, exudate, or swelling.  Neck: supple/nontender.  No LAD, mass, or TM.  Carotid pulses 2+ bilaterally, without bruits. CV: RRR, no m/r/g.   LUNGS: CTA bilat, nonlabored resps, good aeration in all lung fields. ABD: soft, NT, ND, BS normal.  No hepatospenomegaly or mass.  No bruits. EXT: no clubbing, cyanosis, or edema.  Musculoskeletal: no joint swelling, erythema, warmth, or tenderness.  ROM of all joints intact. Skin - no sores or suspicious lesions or rashes or color changes Mental status: she had trouble with the date but could  tell me the year. President: "Trump".  Recall 3 words she was 3 of 3.   Neuro: CN 2-12 intact bilaterally, strength 5/5 in proximal and distal upper extremities and lower extremities bilaterally.  She did have a resting tremor, R>>>L when resting hands on knees/thighs--not quite classic "pill-rolling" tremor.  No intention tremor.  FNF normal bilat.  No cog-wheeling rigidity.  Pertinent labs:   Lab Results  Component Value Date   PXTGGYIR48 546 10/08/2015    Lab Results  Component Value Date   TSH 3.694 01/31/2016   Lab Results  Component Value Date   WBC 4.1 04/21/2016   HGB 10.6 (L) 04/21/2016   HCT 32.2 (L) 04/21/2016   MCV 80.1 04/21/2016   PLT 200 04/21/2016   Lab Results  Component Value Date   CREATININE 1.14 (H) 01/31/2016   BUN 21 (H) 01/31/2016   NA 141 01/31/2016   K 4.1 01/31/2016   CL 100 (L) 01/31/2016   CO2 32 01/31/2016   Lab Results  Component Value Date   ALT 17 01/31/2016   AST 20 01/31/2016   ALKPHOS 60 01/31/2016   BILITOT 0.7 01/31/2016   Lab Results  Component Value Date   CHOL 232 (H) 02/07/2016   Lab Results  Component Value Date   HDL 71.50 02/07/2016   Lab Results  Component Value Date   LDLCALC 74 12/09/2014   Lab Results  Component Value Date   TRIG 224.0 (H) 02/07/2016   Lab Results  Component Value Date   CHOLHDL 3 02/07/2016   Lab Results  Component Value Date   HGBA1C 4.7 09/09/2014    ASSESSMENT AND PLAN:   1) Mild cognitive impairment + tremor: question of early stages of parkinsonism.  Watchful waiting at this juncture. If worsening, may have to also consider neuroimaging to r/o brain mets from her previously treated follicular lymphoma.  2) Health maintenance exam: Reviewed age and gender appropriate health maintenance issues (prudent diet, regular exercise, health risks of tobacco and excessive alcohol, use of seatbelts, fire alarms in home, use of sunscreen).  Also reviewed age and gender appropriate health  screening as well as vaccine recommendations. Vaccines UTD, but gave rx for shingrix today. Colon cancer screening: colonoscopy w/out polyps 2016.  No further colonoscopies due to age. Breast ca screening: mammogram 03/14/12-inadequate study (did pt go back for clarifying study.  Given life expectancy < 10 yrs, no further mammograms indicated). Fasting labs today: CBC, BMET, FLP, TSH. Shingrix vaccine rx given to pt, o/w she is UTD on vaccines. Cervical ca screening: pt is s/p hysterectomy for benign reasons--no further PAP smears needed.   An After Visit Summary was printed and given to the patient.  FOLLOW UP:  Return in about 4 months (around 11/02/2016) for routine chronic illness f/u (esp cognitive).  Signed:  Crissie Sickles, MD           07/03/2016

## 2016-07-06 DIAGNOSIS — M170 Bilateral primary osteoarthritis of knee: Secondary | ICD-10-CM | POA: Diagnosis not present

## 2016-07-11 DIAGNOSIS — J9600 Acute respiratory failure, unspecified whether with hypoxia or hypercapnia: Secondary | ICD-10-CM | POA: Diagnosis not present

## 2016-07-11 DIAGNOSIS — M25561 Pain in right knee: Secondary | ICD-10-CM | POA: Diagnosis not present

## 2016-07-11 DIAGNOSIS — M25562 Pain in left knee: Secondary | ICD-10-CM | POA: Diagnosis not present

## 2016-07-11 DIAGNOSIS — I10 Essential (primary) hypertension: Secondary | ICD-10-CM | POA: Diagnosis not present

## 2016-07-11 DIAGNOSIS — M170 Bilateral primary osteoarthritis of knee: Secondary | ICD-10-CM | POA: Diagnosis not present

## 2016-07-24 ENCOUNTER — Other Ambulatory Visit: Payer: Self-pay | Admitting: Cardiology

## 2016-07-24 ENCOUNTER — Other Ambulatory Visit (HOSPITAL_BASED_OUTPATIENT_CLINIC_OR_DEPARTMENT_OTHER): Payer: PPO

## 2016-07-24 DIAGNOSIS — D6489 Other specified anemias: Secondary | ICD-10-CM

## 2016-07-24 DIAGNOSIS — Z8572 Personal history of non-Hodgkin lymphomas: Secondary | ICD-10-CM

## 2016-07-24 DIAGNOSIS — C8200 Follicular lymphoma grade I, unspecified site: Secondary | ICD-10-CM

## 2016-07-24 DIAGNOSIS — D649 Anemia, unspecified: Secondary | ICD-10-CM | POA: Diagnosis not present

## 2016-07-24 LAB — CBC WITH DIFFERENTIAL/PLATELET
BASO%: 1 % (ref 0.0–2.0)
Basophils Absolute: 0 10*3/uL (ref 0.0–0.1)
EOS%: 1.5 % (ref 0.0–7.0)
Eosinophils Absolute: 0.1 10*3/uL (ref 0.0–0.5)
HCT: 34 % — ABNORMAL LOW (ref 34.8–46.6)
HGB: 11.1 g/dL — ABNORMAL LOW (ref 11.6–15.9)
LYMPH%: 15.5 % (ref 14.0–49.7)
MCH: 26.2 pg (ref 25.1–34.0)
MCHC: 32.5 g/dL (ref 31.5–36.0)
MCV: 80.6 fL (ref 79.5–101.0)
MONO#: 0.5 10*3/uL (ref 0.1–0.9)
MONO%: 10.1 % (ref 0.0–14.0)
NEUT#: 3.5 10*3/uL (ref 1.5–6.5)
NEUT%: 71.9 % (ref 38.4–76.8)
Platelets: 194 10*3/uL (ref 145–400)
RBC: 4.22 10*6/uL (ref 3.70–5.45)
RDW: 17.6 % — ABNORMAL HIGH (ref 11.2–14.5)
WBC: 4.9 10*3/uL (ref 3.9–10.3)
lymph#: 0.8 10*3/uL — ABNORMAL LOW (ref 0.9–3.3)

## 2016-07-24 LAB — IRON AND TIBC
%SAT: 14 % — ABNORMAL LOW (ref 21–57)
Iron: 50 ug/dL (ref 41–142)
TIBC: 365 ug/dL (ref 236–444)
UIBC: 315 ug/dL (ref 120–384)

## 2016-07-24 LAB — FERRITIN: Ferritin: 26 ng/ml (ref 9–269)

## 2016-07-26 ENCOUNTER — Telehealth: Payer: Self-pay | Admitting: *Deleted

## 2016-07-26 NOTE — Telephone Encounter (Signed)
-----   Message from Ladell Pier, MD sent at 07/24/2016  7:43 PM EDT ----- Please call patient, hemoglobin and iron are borderline low, continue iron, f/u as scheduled  Add ferritin to next lab

## 2016-07-26 NOTE — Telephone Encounter (Signed)
Telephone call to patient and discussed lab results as directed below. Pt confirms Sept appt. And will continue iron tablets. Patient understands to call the office with any concerns in the interm

## 2016-08-07 ENCOUNTER — Encounter: Payer: Self-pay | Admitting: Pulmonary Disease

## 2016-08-07 ENCOUNTER — Ambulatory Visit (INDEPENDENT_AMBULATORY_CARE_PROVIDER_SITE_OTHER): Payer: PPO | Admitting: Pulmonary Disease

## 2016-08-07 VITALS — BP 120/62 | HR 50 | Temp 97.0°F | Ht 66.0 in | Wt 197.4 lb

## 2016-08-07 DIAGNOSIS — D6489 Other specified anemias: Secondary | ICD-10-CM

## 2016-08-07 DIAGNOSIS — Z79899 Other long term (current) drug therapy: Secondary | ICD-10-CM | POA: Diagnosis not present

## 2016-08-07 DIAGNOSIS — I480 Paroxysmal atrial fibrillation: Secondary | ICD-10-CM | POA: Diagnosis not present

## 2016-08-07 DIAGNOSIS — I10 Essential (primary) hypertension: Secondary | ICD-10-CM

## 2016-08-07 DIAGNOSIS — I5032 Chronic diastolic (congestive) heart failure: Secondary | ICD-10-CM | POA: Diagnosis not present

## 2016-08-07 DIAGNOSIS — R942 Abnormal results of pulmonary function studies: Secondary | ICD-10-CM

## 2016-08-07 DIAGNOSIS — C8200 Follicular lymphoma grade I, unspecified site: Secondary | ICD-10-CM | POA: Diagnosis not present

## 2016-08-07 DIAGNOSIS — R0600 Dyspnea, unspecified: Secondary | ICD-10-CM

## 2016-08-07 NOTE — Patient Instructions (Signed)
Today we updated your med list in our EPIC system...    Continue your current medications the same...  We will arrange for a repeat pulmonary function test to follow up on your previous study ~38yr ago...    We will contact you w/ the results when available...   Stay as active as possible...  Call for any questions...  Let's plan a follow up visit in 30mo, sooner if needed for problems.Marland KitchenMarland Kitchen

## 2016-08-07 NOTE — Progress Notes (Signed)
Subjective:     Patient ID: Amanda Sellers, female   DOB: [birth year], 83 y.o.   MRN: [id number]  HPI    83 y/o WF, referred by DrHarding due to an abnormal PFT revealing a low DLCO, pt is on amiodarone for AFib...   ~  May 10, 2015:  Initial Pulmonary consultation by SN>  She is here today w/ her daughter Amanda Sellers, an RN from the ICU at [place];  Her PCP is DrPMcGowen in [place]...    83 y/o WF referred by DrHarding for a decr DLCO on Amiodarone for AFib... She notes DOE w/ exertion but states that it's getting better w/ a walking regimen & feels that she is getting stronger;  She has a hx of a follicular lymphoma and had chemoRx from DrSherrill (his note of 03/29/15 is reviewed)- diagnosed 06/2014 & treated w/ 4 cycles of bendamustine/ rituximab w/ f/u CT showing signif improvement in adenopathy, lung nodules and renal lesions;  NOTE> she was placed on O2 for nocturnal use by DrSherrill during her ChemoRx, uses it prn only but doesn't want to give it up!... She saw DrHarding last 01/2015 for f/u AFib & CHF- on Amiodarone since 11/2014;  NOTE> she states her breathing is much better since her successful 2nd DCCV=> NSR...  2DEcho 07/22/14:  Mild LVH, borderline LVF w/ EF=50-55%, no regional wall motion abn, Gr2DD, trivAI, calcif mitral annulus, mod LAdil at 20m, PAsys=382mg  Myoview 10/29/2014:  There is a medium defect of mild severity present in the mid anterior and apical anterior location. This is a low risk study. Breast attenuation artifact Study would not gate secondary to afib and PVCs.  TEE 10/16/2014: Normal LV function (EF 55-60%); biatrial enlargment; spontaneous contrast in LAA but no thrombus; mild AI; moderate MR (2+); mild TR.  Smoking Hx>  She is a never smoker.  Pulmonary Hx>  No prev hx of lung dis- denies hx asthma, no prev bouts of bronchitis, never had pneumonia, no hx TB or known exposure.    Cardiac Hx>  Adm 09/2014 w/ AFlutter & rvr, s/p cardioversion x2 (last 11/2014); HBP,  Chronic diastolic CHF, Valvular dis w/ modMR & mild AI  Medical Hx>  Hx non-Hodgkins Lymphoma 06/2014 treated w/ bendamustine/ rituximab by DrSherrill; Hx microcytic anemia & scant Fe stores c/w IDA, hx of B12 defic- prev on shots, Hypothyroid, Anxiety  Family Hx>  FamHx is NEG for any lung diseases...  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  Current Meds>  Last ChemoRx 10/2015; Amio200, Eliquis5Bid, ToprolXL25Bid, Lasix40AM&20PM, K20, Prav80, Synthroid100, Xanax0.5, Fe-Bid, VitD,   EXAM shows Afeb, VSS, O2sat=97% on RA at rest;  HEENT- neg, mallampati2;  Chest- clear w/o w/r/r;  Heart- RR Gr1/6 SEM w/o r/g;  Abd- soft, nontender, neg;  Ext- neg w/o c/c/e;  Neuro- intact...   Last CT Chest 11/17/14>  Improved thor adenopathy, modHH, norm heart size, diffuse atherosclerosis, R>L pleural effusions, diminished bilat pulm nodules (largest 16m72mIn lingula), mild subpleural reticulation & interstitial prom  Last CXR 11/25/14>  Cardiomegaly & pul vasc congestion, atherosclerotic calcif in Ao, peribronch thickening & flattened diaph w/ bibasilar atx, osteopenia and T12 compression (chronic)  PFTs 02/26/15>  FVC=2.12 (74%), FEV1=1.70 (80%), %1sec=80, mid-flows wnl at 91% predicted; TLC=5.41 (101%), RV=3.42 (136%), RV/TLC=63%; DLCO=56% predicted (DL/VA=82% predicted)  CXR today 05/10/15>  Borderline heart size, some hyperinflation noted, clear lungs, NAD...   LABS 05/10/15>  Chems-  ok x BS=110, Cr=1.13;  CBC- Hg=11.0, MCV=84;  Fe=52 (12%sat), Ferritin=37;  TSH=3.30, FreeT4=1.31;  B12=210;  Folate>23 IMP >>     Abnormal PFT w/ norm airflow, norm lung volumes, air trapping & decr DLCO=56%>  This is a baseline PFT study done 02/2015 & Amio was started 10/2014; DLCO also affected by lung vol & anemia...    CT Chest 10/2014 had some CHF w/ interstitial prominence, sm bilat effusions, but improved adenopathy & diminished pulm  nodules s/p chemotherapy for lymphoma    HBP> on MetopER25Bid, Lasix40AM&20PM, K20    Hx PAF> on Eliquis5Bid, Amiodarone200; she is holding NSR on these meds...    Chr diastolic CHF> on meds above; last BNP was 11/2014= 378...    Hyperlipidemia> on Prav80 + Fish Oil    Hypothyroid> on Synthroid100    Osteopenia and T12 compression    Hx non-hodgkins lymphoma> dx 6/16 & treated w/ 4 cycles chemoRx (last 10/28/14)    Anemia> on FeBid since 6/16 & followed by DrSherrill; last CBC 03/11/15 showed Hg=10.6, MCV=81; will discuss w/ DrSherrill re: poss IV iron therapy & she definitely needs to get back on her B12 supplementation.    Hx B12 defic> she is not currently taking B12 shots or supplements;  Vit B12 blood level 04/2015 off supplement for ?9yr 210 PLAN >>     SLenixindicates that her DOE is improving, getting better "It helps me to walk" she notes;  We are hindered by the timing of her studies- starting on Amio 10/2014, last CTChest 10/2014, 1st PFT 02/2015, her chronic diastolic CHF, mild anemia, etc... She will continue same meds, continue to exercise, f/u w/ Oncology & we will recheck pt in 6 weeks...   ~  June 29, 2015:  6wk ROV w/ SN>  SHarleenreports that her dyspnea is much improved overall- she denies CP, cough, sput, palpit, dizziness, edema, etc;  She says she developed a fever blister on her lower lip ~3wks ago, "couldn't get an appt w/ Derm until end of the month";  Now w/ large round lesion left side lower lip => refer to ENT NOW...   Prob list as noted above- on O2 "prn" per DrSherrill (she uses it occas at night), Eliquis5Bid, Amio200, Metop25Bid, Lasix40AM&20PM, K20, Prav80, Fe-Bid, VitD50K/wk, Synthroid100, Alpraz0.5prn... Her B12 level was 210 04/2015 & she is asked to start VitB12 shots Qmo again (daughter will give these to her at home)...    She had f/u AFib clinic 47/41/63 AFib, diastolicCHF; she had TEE & DCCV 09/2014, then recurrent AFib & again cardioverted 11/2014, maintaining  NSR since then- their extensive note is reviewed & she is much improved at present...    She saw DrSherrill 38/4/53>f/u on her follicular lymphoma, anemia (Fe de462f & hx B12 defic); felt to be in clinical remission & he continues to follow Q4m69mo    She saw DrMcGowen 06/25/15- note reviewed... IMP/PLAN>>   She is rec to continue current meds; restart B12 shots monthly per her daughter who is an RN,Therapist, sportsefer to ENT for Bx/ excision of left lower lip lesion... We plan ROV recheck in 1109mo42mo ~  October 04, 2015:  1109mo 69mow/ SN>  ShirlGailyarns and notes some SOB in hot weather & w/ exertion; stress also contributes as her sister passed away recently; she remains too sedentary & not getting regular exercise... 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    CT Chest 10/2014 had some CHF w/ interstitial prominence, sm bilat effusions, but improved adenopathy & diminished pulm nodules s/p chemotherapy for lymphoma    HBP> on MetopER25Bid, Lasix40Bid, K20;  BP= 124/70 7 she denies CP, palpit, dizzy, edema...    Hx PAF> on Eliquis5Bid, Amiodarone200; she is holding NSR on these meds & followed by DrHarding- seen 08/09/15 w/ HBP, mild diastolicCHF & PAF; no change in meds...    Chr diastolic CHF> on meds above; last BNP was 11/2014= 378...    Hyperlipidemia> on Prav80 + Fish Oil; last FLP was 11/16 showing TChol 150, TG 135, HDL 49, LDL 74    Hypothyroid> on Synthroid100; last TSH was 4/17 showing TSH= 3.30    Osteopenia and T12 compression> Imaging reveals stable chr T12 compression fx; ?last BMD 2008 w/ lowest Tscore -2.3 in left FemNeck on WomensMVI, VitD 50K/wk, wt bearing exercise but no bone building  meds...    Hx non-hodgkins lymphoma> dx 6/16 & treated w/ 4 cycles chemoRx (last 10/28/14)- Imaging 10/16 w/ signif decr adenopathy in chest/ abd/ pelvis, resolution of splenomegaly, decr in pulm & renal nodularity, incr bilat pleural effusions & basilar atx; she last saw DrSherrill 08/05/15- good appetite & energy level, he felt she remained in remission & planed f/u 4mo    Anemia> on FeBid since 6/16 & followed by DrSherrill; last CBC 7/17 showed Hg=10.9, MCV=87...    Hx B12 defic> Vit B12 blood level 04/2015 off supplement for ?1yr= 210; we restarted VitB12 shots=> Vit B12 level 9/17 = 484 EXAM shows Afeb, VSS, O2sat=98% on RA at rest;  HEENT- neg, mallampati2, NEW- large lesion lower lip;  Chest- clear w/o w/r/r;  Heart- RR Gr1/6 SEM w/o r/g;  Abd- soft, nontender, neg;  Ext- VI w/o c/c/e;  Neuro- intact...  IMP/PLAN>>  Note- pt has O2 per DrSherrill & states "I use it at night when I need it for SOB"; OK 2017 Flu vaccine today; we reviewed low carb diet & incr exercise program... we plan rov recheck 6mo...  ~  April 10, 2016:  6mo ROV & Amrie is c/o breathing worse & notes SOB w/ ADLs x 2-3 wks now she says but she has no idea as to why? She denies cough, sput, hemoptysis; denies CP, palpit, dizzy, edema; just c/o SOB w/ activity & when probed further for more information she notes SOB w/ ADLs but she continues to work-out on her exercise bike 2x per day w/o difficulty & NOT SOB on this; she says the SOB w/ ADLs is a sensation of not being able to get a deep breath "IN" but she does not note any wheezing etc; she is not aware of any incr stress, she has Xanax but not taking... From the pulmonary standpoint she is a never smoker, had an isolated decr in DLCO on PFTs (on Amio), started on nocturnal O2 by DrSherrill while she was on chemotherapy for her Lymphoma w/ pulm nodules on CT (these diminished after Chemo as per her last CT Chest 10/2014), and is under treatment for her DiastolicCHF & doing satis on  Lasix40Bid... She has maintained regular f/u w/ her specialist>>    She had f/u visit w/ ENT-DrByers 11/19/15> s/p excision of squamous   cell ca left lower lip 07/15/15 & doing well...    She saw DrSherrill 11/26/15> f/u Non-hodgkins Lymphoma- it's been one year since last scans but there was no mention of re-checking CT Chest/ Abd/ Pelvis, they plan ROV in 4mo...    She was seen in the AF Clinic 01/31/16>  DiastolicCHF & PAFib- s/p DCCV 9/17, placed on Eliquis, subseq return of AFib & had to start Amio200, Metoprolol=> now maintaining NSR & dyspnea improved after diuresis...    She saw her PCP- DrMcGowen 02/07/16> f/u HBP(controlled on Metoprolol, Lasix, KCl), HL (at goals on Prav80), Hypothy (stable on Synthroid100), CKD (stable w/ Cr=1.14)... EXAM shows Afeb, VSS, O2sat=100% on RA at rest;  HEENT- neg, mallampati2, lip lesion resolved;  Chest- clear w/o w/r/r;  Heart- RR Gr1/6 SEM w/o r/g;  Abd- soft, nontender, neg;  Ext- VI w/o c/c/e;  Neuro- intact...    CXR 04/10/16>  Mild cardiomeg, clear lungs w/o edema/ infiltrate/ or pleural effusions- NAD, modHH, mild degen changes in Tspine w/ compression of T12...  Ambulatory Oximetry 04/10/16>  O2sat=97% on RA at rest w/ pulse=56/min;  She walked just 2 Laps (185' ea) w/ lowest O2sat=97% w/ pulse up to 95/min... IMP/PLAN>>  We discussed how this dyspnea is diff than her prev SOB w/ AFib & CHF- esp how she feels SOB w/ ADLs (can't get a deep breath "IN") but able to work-out on her exercise bike Bid w/o difficulty;  In this regard I have rec a trial of KLONOPIN 0.5mg 1/2 to 1 tab po Bid to relax her chest wall muscles and allow a deeper more satisfying breath; we plan rov recheck to document response in 1mo...  ~  May 08, 2016:  1mo ROV & last visit we recommended Klonopin 0.5mg Bid for her dyspnea & she reports much improved!  Now her CC is knee pain & she ambulates w/ walker- requesting pain med and referral to ORTHO for poss knee injection; we wrote for  TRAMADOL50 & will set her up w/ Ortho per request...     She had f/u DrSherrill 04/21/16>  Hx non-hodgkin's lymphoma, anemia, hx B12 defic, hx SCCa left lower lip excised 2017;  She remains in remission & is rec to continue on Iron therapy...  EXAM shows Afeb, VSS, O2sat=95% on RA at rest;  HEENT- neg, mallampati2, lip lesion resolved;  Chest- clear w/o w/r/r;  Heart- RR Gr1/6 SEM w/o r/g;  Abd- soft, nontender, neg;  Ext- VI w/o c/c/e;  Neuro- intact...   IMP/PLAN>>  We wrote for Tramadol & will refer her to Ortho to see if a shot would be helpful, discuss options;  We plan rov recheck in ~3mo...   ~  August 07, 2016:  3mo ROV & Alline reports breathing well- still taking the Klonopin 0.5mg Bid for the dyspnea;  Her CC remains knee pain & she has seen Ortho- DrGioffre for cortisone shots w/ temp improvement; they also rec 5 lb ankle weights to strengthen her Quads, and they received insurance approval for "gel shots" (Euflexxa), uses brace and walker...  She has Home O2 and uses this just as needed- day or night...    She saw CARDS-DrHarding 05/15/16>  AFib & Diastolic CHF;  On Amio, Eliquis, Metoprolol & Lasix;  Doing well x for knee pain, he notes that Klonopin rx is really helping her dypnea/ sleep/ etc; they are reluctant to decr the amio dose as she does not tol the AFib very well...    She saw PCP-DrMcGowen 07/03/16>    Medicare health maintenance exam, note reviewed- HBP, HL, hypothy, renal insuffic, DJD;  Family noted short term memory problems (MCI + tremor) but this appears minor, Labs ok x Hg=10.6, mcv=80 & followed by DrSherrill on oral iron supplement...  EXAM shows Afeb, VSS, O2sat=95% on RA at rest;  HEENT- neg, mallampati2, lip lesion resolved;  Chest- clear w/o w/r/r;  Heart- RR Gr1/6 SEM w/o r/g;  Abd- soft, nontender, neg;  Ext- VI w/o c/c/e;  Neuro- intact...    Labs 6/18>  Chems- ok w/ BS=121, Cr=0.93;  CBC- Hg=11.1, mcv=81, Fe=50 (14%sat) on oral Fe supplement;  TSH=4.17...  FullPFTs  08/08/16>  FVC=1.75 (63%), FEV1=1.36 (65%), %1sec=77%, mid-flows wnl at 84% predicted;  TLC=3.82 (71%), RV=1.91 (75%), RV/TLC=50%;  DLCO=60% predicted & DL/VA is 94% predicted... NOTE- DLCO is stable from 02/2015, no change...  IMP/PLAN>>  Karryn is stable, both clinically & by PFTs, rec to contiunue same meds and follow up 40mo..    Past Medical History:  Diagnosis Date  . Anticoagulation adequate, Eliquis 10/16/2014  . Anxiety    pt denies  . Atrial fibrillation ([place]    With RVR; elec cardioversion 11/30/14.  On Amio since 10/2014; Dr. NLenna Gilfordfollowing her from pulm standpoint regarding this med.  . Chronic diastolic congestive heart failure ([place] 094/80/1655  Complicated by atrial fibrillation; Echo 07/23/14: mild LVH, EF 50-55%, grade 2 diast dysfxn  . Chronic renal insufficiency, stage III (moderate)    GFR @ 50  . COPD (chronic obstructive pulmonary disease) ([place]    recently noted on Xray, does not have any problems  . Family history of adverse reaction to anesthesia    daughter has difficulty waking up  . Follicular lymphoma ([facility] [place]    Non Hodgkins B cell lymphoma; s/p chemo summer 2016--in remission as of 03/2016.  .Marland KitchenHLD (hyperlipidemia)     06/2014  . HTN (hypertension)   . Hypothyroidism   . Lip cancer    Dr. BJanace Hoardexcised this: invasive SCC--no sign of cancer at ENT f/u 10/2015  . Microcytic anemia    transfused 3 U total in hosp 07/2014  . Olecranon bursitis of right elbow 02/2015  . Pancytopenia due to antineoplastic chemotherapy ([place]   . Positive occult stool blood test 08/08/14   Endoscopies ok 08/2014  . Primary osteoarthritis of both knees 04/2016   Dr. GGladstone Lighter bone on bone--to get euflexxa to both knees.  . Pulmonary metastases ([place] 07/24/2014  . Shortness of breath dyspnea    with exertion.  Dr. NLenna Gilfordstarted her on clonazepam to try to relax her chest wall and allow more satisfying deep breath. (03/2016).  . Vitamin B 12 deficiency   . Vitamin D deficiency      Past Surgical History:  Procedure Laterality Date  . ABDOMINAL HYSTERECTOMY  1978  . BONE MARROW BIOPSY  07/24/14  . BREAST BIOPSY    . CARDIOVERSION N/A 10/16/2014   Procedure: CARDIOVERSION;  Surgeon: BLelon Perla MD;  [facility];  Service: Cardiovascular;  Laterality: N/A;  . CARDIOVERSION N/A 11/30/2014   Procedure: CARDIOVERSION;  Surgeon: DLarey Dresser MD;  [facility];  Service: Cardiovascular;  Laterality: N/A;  . COLONOSCOPY  09/23/14   small hemorrhoids, otherwise normal (performed for IDA and heme+ stool)  . COLONOSCOPY    . MASS EXCISION Left 07/15/2015   Left lower lip mass--invasive SCC w/negative margins.  Procedure: EXCISION MASS;  Surgeon: JMelissa Montane MD;  [facility]  Service: ENT;  Laterality: Left;  [place]  excision left lower lip mass  . NM MYOVIEW LTD  10/29/2014   medium size mild surgery defect in the mid anterior and apical anterior location suggestive of breast attenuation. No reversibility. LOW RISK  . PFTs  02/2015   Restriction with diffusion defect: cardiologist referred her to pulm to help interpret PFTs and decide whether she has amiodarone toxicity  . TEE WITHOUT CARDIOVERSION N/A 10/16/2014   Procedure: TRANSESOPHAGEAL ECHOCARDIOGRAM (TEE);  Surgeon: Brian S Crenshaw, MD;  Location: [facility]COPY;  Service: Cardiovascular;  Laterality: N/A;  . TRANSTHORACIC ECHOCARDIOGRAM  07/23/14   mild LVH, EF 50-55%, grade 2 diast dysfxn  . UPPER GI ENDOSCOPY  09/23/2014   small hiatus hernia, nodules in stomach biopsied (chronic active erosive atrophic gastritis with intestinal metaplasia--no dysplasia or malignancy) otherwise normal    Outpatient Encounter Prescriptions as of 08/07/2016  Medication Sig  . ALPRAZolam (XANAX) 0.5 MG tablet 1 tab po qhs prn anxiety-related insomnia  . amiodarone (PACERONE) 200 MG tablet Take 1 tablet (200 mg total) by mouth daily.  . apixaban (ELIQUIS) 5 MG TABS tablet Take 1 tablet (5 mg total) by mouth  2 (two) times daily.  . clonazePAM (KLONOPIN) 0.5 MG tablet Take 1/2 to 1 tablet by mouth two times daily as needed for shortness of breath  . cyanocobalamin (,VITAMIN B-12,) 1000 MCG/ML injection Inject 1 mL (1,000 mcg total) into the skin every 30 (thirty) days.  . ergocalciferol (VITAMIN D2) 50000 units capsule Take 1 capsule (50,000 Units total) by mouth every Sunday.  . ferrous sulfate 325 (65 FE) MG tablet Take 1 tablet (325 mg total) by mouth 2 (two) times daily with a meal.  . fish oil-omega-3 fatty acids 1000 MG capsule Take 2 g by mouth daily.  . furosemide (LASIX) 40 MG tablet TAKE ONE TABLET BY MOUTH TWICE DAILY  . levothyroxine (SYNTHROID, LEVOTHROID) 100 MCG tablet Take 1 tablet (100 mcg total) by mouth daily before breakfast.  . metoprolol succinate (TOPROL-XL) 25 MG 24 hr tablet Take 25 mg by mouth 2 (two) times daily.  . OXYGEN Inhale into the lungs. 1Lt at bedtime  . potassium chloride 20 MEQ TBCR Take 20 mEq by mouth daily.  . pravastatin (PRAVACHOL) 80 MG tablet TAKE ONE TABLET BY MOUTH DAILY  . prochlorperazine (COMPAZINE) 10 MG tablet Take 10 mg by mouth every 8 (eight) hours as needed for nausea or vomiting. Reported on 08/05/2015  . traMADol (ULTRAM) 50 MG tablet Take 1 tablet (50 mg total) by mouth 2 (two) times daily. For arthritis pain   No facility-administered encounter medications on file as of 08/07/2016.     Allergies  Allergen Reactions  . Iodinated Diagnostic Agents Rash and Other (See Comments)    CT Contrast    Immunization History  Administered Date(s) Administered  . Influenza,inj,Quad PF,36+ Mos 10/27/2014, 10/04/2015  . Pneumococcal Conjugate-13 02/07/2016  . Pneumococcal-Unspecified 01/23/2014  . Tdap 12/09/2014  . Zoster 03/19/2012    Current Medications, Allergies, Past Medical History, Past Surgical History, Family History, and Social History were reviewed in [facility] electronic medical record.   Review of Systems             All  symptoms NEG except where BOLDED >>  Constitutional:  F/C/S, fatigue, anorexia, unexpected weight change. HEENT:  HA, visual changes, hearing loss, earache, nasal symptoms, sore throat, mouth sores, hoarseness. Resp:  cough, sputum, hemoptysis; SOB, tightness, wheezing. Cardio:  CP, palpit, DOE, orthopnea, edema. GI:  N/V/D/C, blood in stool; reflux, abd pain,   distention, gas. GU:  dysuria, freq, urgency, hematuria, flank pain, voiding difficulty. MS:  joint pain, swelling, tenderness, decr ROM; neck pain, back pain, etc. Neuro:  HA, tremors, seizures, dizziness, syncope, weakness, numbness, gait abn. Skin:  suspicious lesions or skin rash. Heme:  adenopathy, bruising, bleeding. Psyche:  confusion, agitation, sleep disturbance, hallucinations, anxiety, depression suicidal.   Objective:   Physical Exam       Vital Signs:  Reviewed...   General:  WD, WN, 84 y/o WF in NAD; alert & oriented; pleasant & cooperative... HEENT:  [place]/AT; Conjunctiva- pink, Sclera- nonicteric, EOM-wnl, PERRLA, EACs-clear, TMs-wnl; NOSE-clear; THROAT-clear & wnl.  Neck:  Supple w/ fair ROM; no JVD; normal carotid impulses w/o bruits; no thyromegaly or nodules palpated; no lymphadenopathy.  Chest:  Clear to P & A; without wheezes, rales, or rhonchi heard. Heart:  Regular Rhythm; Gr1/6 SEM, without rubs or gallops detected. Abdomen:  Soft & nontender- no guarding or rebound; normal bowel sounds; no organomegaly or masses palpated. Ext:  decrROM; without deformities +arthritic changes; no varicose veins, venous insuffic, or edema;  Pulses intact w/o bruits. Neuro:  CNs II-XII intact; motor testing normal; sensory testing normal; gait normal & balance OK. Derm:  No lesions noted; no rash etc. Lymph:  No cervical, supraclavicular, axillary, or inguinal adenopathy palpated.   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    Abnormal PFT w/ norm airflow, norm lung volumes, air trapping & decr DLCO=56%>  This is a baseline PFT study done 02/2015 & Amio was started 10/2014; DLCO also affected by lung vol & anemia...    CT Chest 10/2014 had some CHF w/ interstitial prominence, sm bilat effusions, but improved adenopathy & diminished pulm nodules s/o chemotherapy for lymphoma    HBP> on MetopER25Bid, Lasix40AM&20PM, K20    Hx PAF> on Eliquis5Bid, Amiodarone200; she is holding NSR on these meds...    Chr diastolic CHF> on meds above; last BNP was 11/2014= 378...    Hyperlipidemia> on Prav80 + Fish Oil    Hypothyroid> on Synthroid100    Osteopenia and T12 compression    Hx non-hodgkins lymphoma> dx 6/16 & treated w/ 4 cycles chemoRx (last 10/28/14)    Anemia> on FeBid since 6/16 & followed by DrSherrill; last CBC 03/11/15 showed Hg=10.6, MCV=81; will discuss w/ DrSherrill re: poss IV iron therapy & she definitely needs to get back on her B12 supplementation.    Hx B12 defic> she is not currently taking B12 shots or supplements & B12 level 04/2015 = 210; rec to restart her B12 shots 1000mcg/mo (admit by her RN daughter)...   PLAN >>   05/10/15>   Amanda Sellers indicates that her DOE is improving, getting better "It helps me to walk" she notes;  We are indered by the timing of her studies- starting on Amio 10/2014, last CTChest 10/2014, 1st PFT 02/2015, her chronic diastolic CHF, mild anemia, etc... She will continue same meds, continue to exercise, f/u w/ Oncology & we will recheck pt in 6 weeks...  06/29/15>   She is rec to continue current meds; restart B12 shots monthly per her daughter who is an RN, refer to ENT for Bx/ excision of left lower lip lesion. 10/04/15>   Note- pt has O2 per DrSherrill & states "I use it at night when I need   it for SOB"; OK 2017 Flu vaccine today; we reviewed low carb diet & incr exercise program... we plan rov recheck 6mo 04/10/16>   We discussed  how this dyspnea is diff than her prev SOB w/ AFib & CHF- esp how she feels SOB w/ ADLs (can't get a deep breath "IN") but able to work-out on her exercise bike Bid w/o difficulty;  In this regard I have rec a trial of KLONOPIN 0.5mg 1/2 to 1 tab po Bid to relax her chest wall muscles and allow a deeper more satisfying breath; we plan rov recheck to document response in 1mo.  05/08/16>   Klonopin is really helping her dyspnea; today c/o right knee pain & we'll try Tramadol + refer to Ortho... 08/07/16>   Amanda Sellers is stable, both clinically & by PFTs, rec to contiunue same meds and follow up 6mo.   Plan:     Patient's Medications  New Prescriptions   No medications on file  Previous Medications   ALPRAZOLAM (XANAX) 0.5 MG TABLET    1 tab po qhs prn anxiety-related insomnia   AMIODARONE (PACERONE) 200 MG TABLET    Take 1 tablet (200 mg total) by mouth daily.   APIXABAN (ELIQUIS) 5 MG TABS TABLET    Take 1 tablet (5 mg total) by mouth 2 (two) times daily.   CLONAZEPAM (KLONOPIN) 0.5 MG TABLET    Take 1/2 to 1 tablet by mouth two times daily as needed for shortness of breath   CYANOCOBALAMIN (,VITAMIN B-12,) 1000 MCG/ML INJECTION    Inject 1 mL (1,000 mcg total) into the skin every 30 (thirty) days.   ERGOCALCIFEROL (VITAMIN D2) 50000 UNITS CAPSULE    Take 1 capsule (50,000 Units total) by mouth every Sunday.   FERROUS SULFATE 325 (65 FE) MG TABLET    Take 1 tablet (325 mg total) by mouth 2 (two) times daily with a meal.   FISH OIL-OMEGA-3 FATTY ACIDS 1000 MG CAPSULE    Take 2 g by mouth daily.   FUROSEMIDE (LASIX) 40 MG TABLET    TAKE ONE TABLET BY MOUTH TWICE DAILY   LEVOTHYROXINE (SYNTHROID, LEVOTHROID) 100 MCG TABLET    Take 1 tablet (100 mcg total) by mouth daily before breakfast.   METOPROLOL SUCCINATE (TOPROL-XL) 25 MG 24 HR TABLET    Take 25 mg by mouth 2 (two) times daily.   OXYGEN    Inhale into the lungs. 1Lt at bedtime   POTASSIUM CHLORIDE 20 MEQ TBCR    Take 20 mEq by mouth daily.    PRAVASTATIN (PRAVACHOL) 80 MG TABLET    TAKE ONE TABLET BY MOUTH DAILY   PROCHLORPERAZINE (COMPAZINE) 10 MG TABLET    Take 10 mg by mouth every 8 (eight) hours as needed for nausea or vomiting. Reported on 08/05/2015   TRAMADOL (ULTRAM) 50 MG TABLET    Take 1 tablet (50 mg total) by mouth 2 (two) times daily. For arthritis pain  Modified Medications   No medications on file  Discontinued Medications   No medications on file      

## 2016-08-08 ENCOUNTER — Ambulatory Visit (INDEPENDENT_AMBULATORY_CARE_PROVIDER_SITE_OTHER): Payer: PPO | Admitting: Pulmonary Disease

## 2016-08-08 DIAGNOSIS — R0600 Dyspnea, unspecified: Secondary | ICD-10-CM | POA: Diagnosis not present

## 2016-08-08 DIAGNOSIS — R942 Abnormal results of pulmonary function studies: Secondary | ICD-10-CM

## 2016-08-08 LAB — PULMONARY FUNCTION TEST
DL/VA % pred: 94 %
DL/VA: 4.78 ml/min/mmHg/L
DLCO unc % pred: 60 %
DLCO unc: 16.41 ml/min/mmHg
FEF 25-75 Post: 1.68 L/sec
FEF 25-75 Pre: 1.19 L/sec
FEF2575-%Change-Post: 40 %
FEF2575-%Pred-Post: 118 %
FEF2575-%Pred-Pre: 84 %
FEV1-%Change-Post: 7 %
FEV1-%Pred-Post: 70 %
FEV1-%Pred-Pre: 65 %
FEV1-Post: 1.46 L
FEV1-Pre: 1.36 L
FEV1FVC-%Change-Post: -4 %
FEV1FVC-%Pred-Pre: 105 %
FEV6-%Change-Post: 13 %
FEV6-%Pred-Post: 75 %
FEV6-%Pred-Pre: 66 %
FEV6-Post: 1.98 L
FEV6-Pre: 1.74 L
FEV6FVC-%Pred-Post: 105 %
FEV6FVC-%Pred-Pre: 105 %
FVC-%Change-Post: 13 %
FVC-%Pred-Post: 71 %
FVC-%Pred-Pre: 63 %
FVC-Post: 1.98 L
FVC-Pre: 1.75 L
Post FEV1/FVC ratio: 74 %
Post FEV6/FVC ratio: 100 %
Pre FEV1/FVC ratio: 77 %
Pre FEV6/FVC Ratio: 100 %
RV % pred: 75 %
RV: 1.91 L
TLC % pred: 71 %
TLC: 3.82 L

## 2016-08-08 NOTE — Progress Notes (Signed)
PFT done today. 

## 2016-08-10 DIAGNOSIS — J9600 Acute respiratory failure, unspecified whether with hypoxia or hypercapnia: Secondary | ICD-10-CM | POA: Diagnosis not present

## 2016-08-10 DIAGNOSIS — I10 Essential (primary) hypertension: Secondary | ICD-10-CM | POA: Diagnosis not present

## 2016-08-14 DIAGNOSIS — K121 Other forms of stomatitis: Secondary | ICD-10-CM | POA: Diagnosis not present

## 2016-08-28 DIAGNOSIS — M170 Bilateral primary osteoarthritis of knee: Secondary | ICD-10-CM | POA: Diagnosis not present

## 2016-08-28 DIAGNOSIS — M25562 Pain in left knee: Secondary | ICD-10-CM | POA: Diagnosis not present

## 2016-08-28 DIAGNOSIS — M25561 Pain in right knee: Secondary | ICD-10-CM | POA: Diagnosis not present

## 2016-08-30 DIAGNOSIS — Z972 Presence of dental prosthetic device (complete) (partial): Secondary | ICD-10-CM | POA: Diagnosis not present

## 2016-08-30 DIAGNOSIS — M2679 Other specified alveolar anomalies: Secondary | ICD-10-CM | POA: Diagnosis not present

## 2016-08-30 DIAGNOSIS — K121 Other forms of stomatitis: Secondary | ICD-10-CM | POA: Diagnosis not present

## 2016-09-10 DIAGNOSIS — J9600 Acute respiratory failure, unspecified whether with hypoxia or hypercapnia: Secondary | ICD-10-CM | POA: Diagnosis not present

## 2016-09-10 DIAGNOSIS — I10 Essential (primary) hypertension: Secondary | ICD-10-CM | POA: Diagnosis not present

## 2016-09-19 ENCOUNTER — Other Ambulatory Visit: Payer: Self-pay | Admitting: Family Medicine

## 2016-09-20 NOTE — Telephone Encounter (Signed)
[place].  RF request for Vitamin D LOV: 07/03/16 Next ov: 10/30/16 Last written: 02/13/16 #12 w/ 1RF  Please advise. Thanks.   I have refilled pts levothyroxine and amiodarone.

## 2016-09-22 ENCOUNTER — Encounter: Payer: Self-pay | Admitting: Family Medicine

## 2016-10-09 ENCOUNTER — Telehealth: Payer: Self-pay | Admitting: Nurse Practitioner

## 2016-10-09 ENCOUNTER — Ambulatory Visit: Payer: PPO | Admitting: Nurse Practitioner

## 2016-10-09 ENCOUNTER — Other Ambulatory Visit: Payer: PPO

## 2016-10-09 ENCOUNTER — Other Ambulatory Visit (HOSPITAL_BASED_OUTPATIENT_CLINIC_OR_DEPARTMENT_OTHER): Payer: PPO

## 2016-10-09 ENCOUNTER — Ambulatory Visit (HOSPITAL_BASED_OUTPATIENT_CLINIC_OR_DEPARTMENT_OTHER): Payer: PPO | Admitting: Nurse Practitioner

## 2016-10-09 ENCOUNTER — Ambulatory Visit (HOSPITAL_BASED_OUTPATIENT_CLINIC_OR_DEPARTMENT_OTHER): Payer: PPO

## 2016-10-09 VITALS — BP 159/58 | HR 58 | Temp 97.9°F | Resp 24 | Ht 66.0 in | Wt 194.8 lb

## 2016-10-09 DIAGNOSIS — D509 Iron deficiency anemia, unspecified: Secondary | ICD-10-CM

## 2016-10-09 DIAGNOSIS — Z8572 Personal history of non-Hodgkin lymphomas: Secondary | ICD-10-CM | POA: Diagnosis not present

## 2016-10-09 DIAGNOSIS — D508 Other iron deficiency anemias: Secondary | ICD-10-CM

## 2016-10-09 DIAGNOSIS — Z85819 Personal history of malignant neoplasm of unspecified site of lip, oral cavity, and pharynx: Secondary | ICD-10-CM | POA: Diagnosis not present

## 2016-10-09 DIAGNOSIS — L989 Disorder of the skin and subcutaneous tissue, unspecified: Secondary | ICD-10-CM | POA: Diagnosis not present

## 2016-10-09 DIAGNOSIS — R0609 Other forms of dyspnea: Secondary | ICD-10-CM | POA: Diagnosis not present

## 2016-10-09 DIAGNOSIS — D6489 Other specified anemias: Secondary | ICD-10-CM

## 2016-10-09 DIAGNOSIS — C8200 Follicular lymphoma grade I, unspecified site: Secondary | ICD-10-CM

## 2016-10-09 LAB — CBC WITH DIFFERENTIAL/PLATELET
BASO%: 0.5 % (ref 0.0–2.0)
Basophils Absolute: 0 10*3/uL (ref 0.0–0.1)
EOS%: 1.4 % (ref 0.0–7.0)
Eosinophils Absolute: 0.1 10*3/uL (ref 0.0–0.5)
HCT: 33 % — ABNORMAL LOW (ref 34.8–46.6)
HGB: 10.6 g/dL — ABNORMAL LOW (ref 11.6–15.9)
LYMPH%: 16.8 % (ref 14.0–49.7)
MCH: 26.3 pg (ref 25.1–34.0)
MCHC: 32.3 g/dL (ref 31.5–36.0)
MCV: 81.6 fL (ref 79.5–101.0)
MONO#: 0.6 10*3/uL (ref 0.1–0.9)
MONO%: 12 % (ref 0.0–14.0)
NEUT#: 3.3 10*3/uL (ref 1.5–6.5)
NEUT%: 69.3 % (ref 38.4–76.8)
Platelets: 184 10*3/uL (ref 145–400)
RBC: 4.04 10*6/uL (ref 3.70–5.45)
RDW: 16.7 % — ABNORMAL HIGH (ref 11.2–14.5)
WBC: 4.8 10*3/uL (ref 3.9–10.3)
lymph#: 0.8 10*3/uL — ABNORMAL LOW (ref 0.9–3.3)

## 2016-10-09 LAB — IRON AND TIBC
%SAT: 12 % — ABNORMAL LOW (ref 21–57)
Iron: 44 ug/dL (ref 41–142)
TIBC: 379 ug/dL (ref 236–444)
UIBC: 334 ug/dL (ref 120–384)

## 2016-10-09 LAB — URINALYSIS, MICROSCOPIC - CHCC
Bilirubin (Urine): NEGATIVE
Blood: NEGATIVE
Glucose: NEGATIVE mg/dL
Ketones: NEGATIVE mg/dL
Leukocyte Esterase: NEGATIVE
Nitrite: NEGATIVE
Protein: NEGATIVE mg/dL
Specific Gravity, Urine: 1.015 (ref 1.003–1.035)
Urobilinogen, UR: 0.2 mg/dL (ref 0.2–1)
pH: 6 (ref 4.6–8.0)

## 2016-10-09 LAB — FERRITIN: Ferritin: 32 ng/ml (ref 9–269)

## 2016-10-09 NOTE — Progress Notes (Addendum)
  Amanda Sellers   Diagnosis:  Non-Hodgkin's lymphoma  INTERVAL HISTORY:   Amanda Sellers returns as scheduled. She feels well. No fevers or sweats. No enlarged lymph nodes. She denies any bleeding. She continues oral iron twice a day.  Objective:  Vital signs in last 24 hours:  Blood pressure (!) 159/58, pulse (!) 58, temperature 97.9 F (36.6 C), temperature source Oral, resp. rate (!) 24, height '5\' 6"'$  (1.676 m), weight 194 lb 12.8 oz (88.4 kg), SpO2 97 %.    HEENT: Neck without mass. Lymphatics: No palpable cervical, supraclavicular, axillary or inguinal lymph nodes. Resp: Lungs clear bilaterally. Cardio: Regular rate and rhythm. GI: Abdomen soft and nontender. No hepatosplenomegaly. Vascular: No leg edema.   Lab Results:  Lab Results  Component Value Date   WBC 4.8 10/09/2016   HGB 10.6 (L) 10/09/2016   HCT 33.0 (L) 10/09/2016   MCV 81.6 10/09/2016   PLT 184 10/09/2016   NEUTROABS 3.3 10/09/2016    Imaging:  No results found.  Medications: I have reviewed the patient's current medications.  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 Severe microcytic anemia, status post transfusion with packed red blood cells 07/23/2014   Normal ferritin, low transferrin saturation, "scant" bone marrow iron stores, review of peripheral blood  smear consistent with iron deficiency anemia   colonoscopy and upper endoscopy 09/23/2014 3. Exertional dyspnea /orthopnea-most likely secondary to congestive heart failure  4. History of B-12 deficiency  5. Rash over the trunk and extremities 08/18/2014. Appears consistent with a drug rash. Resolved 08/28/2014.  Rash 11/18/2014, likely secondary to a contrast allergy, diagnostic contrast will be listed as an allergy 6. Colonoscopy 06/03/2008. Medium sized internal hemorrhoids.  7. Admission with atrial flutter with a rapid ventricular response 10/15/2014-status post cardioversion 10/16/2014, maintained on  apixaban  Repeat cardioversion 11/30/2014  8. Right olecranon skin lesion-likely a lipoma or cyst 9. 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 She has a persistent mild anemia. We will obtain iron studies and a urinalysis today. We discussed that the anemia could be related to subclinical bleeding while on anticoagulation therapy. We also discussed the anemia might be related to lymphoma. She will return for labs and a follow-up visit in 6 months. She will contact the office in the interim with any problems. We specifically reviewed signs/symptoms suggestive of progressive anemia.  Patient seen with Dr. Benay Spice.    Ned Card ANP/GNP-BC   10/09/2016  12:34 PM  This was a shared visit with Ned Card. Amanda Sellers remains in clinical remission from [place]. She has persistent mild anemia, potentially related iron deficiency or lymphoma. We checked iron studies today.  Amanda Sellers will return for an office visit in 6 months.  Julieanne Manson, M.D.

## 2016-10-09 NOTE — Telephone Encounter (Signed)
Scheduled appt per 9/17 los - Gave patient AVS and calender per los.  

## 2016-10-11 DIAGNOSIS — I10 Essential (primary) hypertension: Secondary | ICD-10-CM | POA: Diagnosis not present

## 2016-10-11 DIAGNOSIS — J9600 Acute respiratory failure, unspecified whether with hypoxia or hypercapnia: Secondary | ICD-10-CM | POA: Diagnosis not present

## 2016-10-30 ENCOUNTER — Ambulatory Visit: Payer: PPO | Admitting: Family Medicine

## 2016-11-01 ENCOUNTER — Encounter: Payer: Self-pay | Admitting: *Deleted

## 2016-11-06 ENCOUNTER — Encounter: Payer: Self-pay | Admitting: Family Medicine

## 2016-11-06 ENCOUNTER — Ambulatory Visit (INDEPENDENT_AMBULATORY_CARE_PROVIDER_SITE_OTHER): Payer: PPO | Admitting: Family Medicine

## 2016-11-06 VITALS — BP 178/75 | HR 60 | Temp 98.1°F | Resp 16 | Ht 66.0 in | Wt 195.8 lb

## 2016-11-06 DIAGNOSIS — N183 Chronic kidney disease, stage 3 unspecified: Secondary | ICD-10-CM

## 2016-11-06 DIAGNOSIS — I10 Essential (primary) hypertension: Secondary | ICD-10-CM

## 2016-11-06 DIAGNOSIS — Z85819 Personal history of malignant neoplasm of unspecified site of lip, oral cavity, and pharynx: Secondary | ICD-10-CM | POA: Diagnosis not present

## 2016-11-06 DIAGNOSIS — I4891 Unspecified atrial fibrillation: Secondary | ICD-10-CM

## 2016-11-06 DIAGNOSIS — I5032 Chronic diastolic (congestive) heart failure: Secondary | ICD-10-CM

## 2016-11-06 DIAGNOSIS — E7800 Pure hypercholesterolemia, unspecified: Secondary | ICD-10-CM | POA: Diagnosis not present

## 2016-11-06 DIAGNOSIS — Z23 Encounter for immunization: Secondary | ICD-10-CM

## 2016-11-06 LAB — BASIC METABOLIC PANEL
BUN: 12 mg/dL (ref 6–23)
CO2: 33 mEq/L — ABNORMAL HIGH (ref 19–32)
Calcium: 8.7 mg/dL (ref 8.4–10.5)
Chloride: 101 mEq/L (ref 96–112)
Creatinine, Ser: 0.84 mg/dL (ref 0.40–1.20)
GFR: 68.94 mL/min (ref >60.00)
Glucose, Bld: 99 mg/dL (ref 70–99)
Potassium: 4.1 mEq/L (ref 3.5–5.1)
Sodium: 142 mEq/L (ref 135–145)

## 2016-11-06 MED ORDER — CLONAZEPAM 0.5 MG PO TABS: ORAL_TABLET | ORAL | 5 refills | Status: DC

## 2016-11-06 NOTE — Progress Notes (Signed)
OFFICE VISIT  11/06/2016   CC:  Chief Complaint  Patient presents with  . Follow-up    RCI, pt is not fasting.    HPI:    Patient is a 83 y.o. Caucasian female who presents for f/u CRI stage III, HLD, HTN.  Has Non Hodgkins B cell lymphoma, in remission as of 03/2016, followed by Dr. Benay Spice. Recent labs showing stable normocytic anemia. She has chronic diastolic HF and A-fib--followed by Dr. Ellyn Hack.  HTN: home monitoring shows bp usually normal, says every time she goes to MD office her systolic bp is up.  HLD: taking statin and no side effects with this.  CRI: Selinda Eon close attention to good hydration.  Avoids NSAIDs.    Her Daughter wanted med to look at her lip/inside mouth to make sure things look ok.  Pt has hx of lip cancer that was excised in the not-to-distant past, also had recent ENT surgeon f/u for this and pt states he said there was no sign of any cancer recurrence. Pt denies mouth pain or lip pain.  She admits her dentures no longer fit her well.  No bleeding from mouth.    Past Medical History:  Diagnosis Date  . Anticoagulation adequate, Eliquis 10/16/2014  . Anxiety    clonaz helps this AND her breathing  . Atrial fibrillation ([place])    With RVR; elec cardioversion 11/30/14.  On Amio since 10/2014; Dr. Lenna Gilford following her from pulm standpoint regarding this med.  . Chronic diastolic congestive heart failure ([place]) 81/01/7508   Complicated by atrial fibrillation; Echo 07/23/14: mild LVH, EF 50-55%, grade 2 diast dysfxn  . Chronic renal insufficiency, stage III (moderate) (HCC)    GFR @ 50  . COPD (chronic obstructive pulmonary disease) ([place])    recently noted on Xray, does not have any problems  . Family history of adverse reaction to anesthesia    daughter has difficulty waking up  . Follicular lymphoma ([place])    Non Hodgkins B cell lymphoma; s/p chemo summer 2016--in remission as of 03/2016.  Marland Kitchen HLD (hyperlipidemia)     06/2014  . HTN (hypertension)   .  Hypothyroidism   . Lip cancer    Dr. Janace Hoard excised this: invasive SCC--no sign of cancer at ENT f/u 10/2015  . Microcytic anemia    transfused 3 U total in hosp 07/2014  . Olecranon bursitis of right elbow 02/2015  . Pancytopenia due to antineoplastic chemotherapy ([place])   . Positive occult stool blood test 08/08/14   Endoscopies ok 08/2014  . Primary osteoarthritis of both knees 04/2016   Dr. Gladstone Lighter: bone on bone--to get euflexxa to both knees.  . Pulmonary metastases ([place]) 07/24/2014  . Shortness of breath dyspnea    with exertion.  Dr. Lenna Gilford started her on clonazepam to try to relax her chest wall and allow more satisfying deep breath. (03/2016).  . Vitamin B 12 deficiency   . Vitamin D deficiency     Past Surgical History:  Procedure Laterality Date  . ABDOMINAL HYSTERECTOMY  1978  . BONE MARROW BIOPSY  07/24/14  . BREAST BIOPSY    . CARDIOVERSION N/A 10/16/2014   Procedure: CARDIOVERSION;  Surgeon: Lelon Perla, MD;  [facility] END[facility]COPY;  Service: Cardiovascular;  Laterality: N/A;  . CARDIOVERSION N/A 11/30/2014   Procedure: CARDIOVERSION;  Surgeon: Larey Dresser, MD;  Location: [facility] ENDOSCOPY;  Service: Cardiovascular;  Laterality: N/A;  . COLONOSCOPY  09/23/14   small hemorrhoids, otherwise normal (performed for IDA and heme+  stool)  . COLONOSCOPY    . MASS EXCISION Left 07/15/2015   Left lower lip mass--invasive SCC w/negative margins.  Procedure: EXCISION MASS;  Surgeon: Melissa Montane, MD;  Location: [place];  Service: ENT;  Laterality: Left;  Wedge excision left lower lip mass  . NM MYOVIEW LTD  10/29/2014   medium size mild surgery defect in the mid anterior and apical anterior location suggestive of breast attenuation. No reversibility. LOW RISK  . PFTs  02/2015   Restriction with diffusion defect: cardiologist referred her to pulm to help interpret PFTs and decide whether she has amiodarone toxicity  . TEE WITHOUT CARDIOVERSION N/A 10/16/2014   Procedure: TRANSESOPHAGEAL  ECHOCARDIOGRAM (TEE);  Surgeon: Lelon Perla, MD;  Location: [facility] ENDOSCOPY;  Service: Cardiovascular;  Laterality: N/A;  . TRANSTHORACIC ECHOCARDIOGRAM  07/23/14   mild LVH, EF 50-55%, grade 2 diast dysfxn  . UPPER GI ENDOSCOPY  09/23/2014   small hiatus hernia, nodules in stomach biopsied (chronic active erosive atrophic gastritis with intestinal metaplasia--no dysplasia or malignancy) otherwise normal    Outpatient Medications Prior to Visit  Medication Sig Dispense Refill  . ALPRAZolam (XANAX) 0.5 MG tablet 1 tab po qhs prn anxiety-related insomnia 90 tablet 1  . amiodarone (PACERONE) 200 MG tablet TAKE ONE TABLET BY MOUTH DAILY 180 tablet 1  . apixaban (ELIQUIS) 5 MG TABS tablet Take 1 tablet (5 mg total) by mouth 2 (two) times daily. 28 tablet 0  . cyanocobalamin (,VITAMIN B-12,) 1000 MCG/ML injection Inject 1 mL (1,000 mcg total) into the skin every 30 (thirty) days. 1 mL 12  . ferrous sulfate 325 (65 FE) MG tablet Take 1 tablet (325 mg total) by mouth 2 (two) times daily with a meal. 30 tablet 1  . fish oil-omega-3 fatty acids 1000 MG capsule Take 2 g by mouth daily.    . furosemide (LASIX) 40 MG tablet TAKE ONE TABLET BY MOUTH TWICE DAILY 60 tablet 10  . levothyroxine (SYNTHROID, LEVOTHROID) 100 MCG tablet TAKE ONE TABLET BY MOUTH DAILY BEFORE BREAKFAST 90 tablet 1  . metoprolol succinate (TOPROL-XL) 25 MG 24 hr tablet Take 25 mg by mouth 2 (two) times daily.    . OXYGEN Inhale into the lungs. 1Lt at bedtime    . potassium chloride 20 MEQ TBCR Take 20 mEq by mouth daily. 30 tablet 3  . pravastatin (PRAVACHOL) 80 MG tablet TAKE ONE TABLET BY MOUTH DAILY 90 tablet 3  . prochlorperazine (COMPAZINE) 10 MG tablet Take 10 mg by mouth every 8 (eight) hours as needed for nausea or vomiting. Reported on 08/05/2015    . traMADol (ULTRAM) 50 MG tablet Take 1 tablet (50 mg total) by mouth 2 (two) times daily. For arthritis pain 60 tablet 5  . Vitamin D, Ergocalciferol, (DRISDOL) 50000 units CAPS  capsule TAKE 1 CAPSULE BY MOUTH EVERY SUNDAY 12 capsule 1  . clonazePAM (KLONOPIN) 0.5 MG tablet Take 1/2 to 1 tablet by mouth two times daily as needed for shortness of breath 60 tablet 5   No facility-administered medications prior to visit.     Allergies  Allergen Reactions  . Iodinated Diagnostic Agents Rash and Other (See Comments)    CT Contrast   ROS As per HPI  PE:  Blood pressure (!) 178/75, pulse 60, temperature 98.1 F (36.7 C), temperature source Oral, resp. rate 16, height 5' 6" (1.676 m), weight 195 lb 12 oz (88.8 kg), SpO2 97 %. Gen: Alert, well appearing.  Patient is oriented to person, place, time,  and situation. AFFECT: pleasant, lucid thought and speech. CV: RRR, no m/r/g.   LUNGS: CTA bilat, nonlabored resps, good aeration in all lung fields. EXT: 2+ edema R ankle, 1+ edema L ankle. MOUTH/LIP/tongue: no erythema, tenderness, swelling, focal lesion, or swelling. Barely perceptible scar on left side of lower lip.  LABS:   Lab Results  Component Value Date   VITAMINB12 484 10/08/2015   Vit D  Lab Results  Component Value Date   TSH 4.17 07/03/2016   Lab Results  Component Value Date   WBC 4.8 10/09/2016   HGB 10.6 (L) 10/09/2016   HCT 33.0 (L) 10/09/2016   MCV 81.6 10/09/2016   PLT 184 10/09/2016   Lab Results  Component Value Date   CREATININE 0.84 11/06/2016   BUN 12 11/06/2016   NA 142 11/06/2016   K 4.1 11/06/2016   CL 101 11/06/2016   CO2 33 (H) 11/06/2016   Lab Results  Component Value Date   ALT 17 01/31/2016   AST 20 01/31/2016   ALKPHOS 60 01/31/2016   BILITOT 0.7 01/31/2016   Lab Results  Component Value Date   CHOL 199 07/03/2016   Lab Results  Component Value Date   HDL 64.40 07/03/2016   Lab Results  Component Value Date   LDLCALC 100 (H) 07/03/2016   Lab Results  Component Value Date   TRIG 176.0 (H) 07/03/2016   Lab Results  Component Value Date   CHOLHDL 3 07/03/2016   Lab Results  Component Value Date    HGBA1C 4.7 09/09/2014    IMPRESSION AND PLAN:  1) HTN: good control at home, white coat component present.  No change in meds today. BMET today.  2) HLD: tolerating statin.  Lipid panel fine 06/2016.  3) CRI stage III: avoids NSAIDs, hydrates well. Lytes/cr today.  4) Hx of lip cancer: no sign of any infection or other abnormality on lips or in mouth today. I encouraged her to visit her dentist to start the process of getting refitted for dentures.  5) Chronic diastolic HF + a-fib (on amiodarone and eliquis): stable.  F/u as per cardiology recommendations.  6) Non Hodgkins B cell lymphoma, in remission as of 03/2016, followed by Dr. Benay Spice. Recent labs showing stable normocytic anemia.  An After Visit Summary was printed and given to the patient.  FOLLOW UP: Return in about 6 months (around 05/07/2017) for annual CPE (fasting).  Signed:  Crissie Sickles, MD           11/06/2016

## 2016-11-06 NOTE — Patient Instructions (Signed)
I don't see any sign of infection in your mouth or on your lip.  You may need to go back to your dentist to get re-fitted for dentures.

## 2016-11-09 ENCOUNTER — Telehealth: Payer: Self-pay | Admitting: Cardiology

## 2016-11-09 NOTE — Telephone Encounter (Signed)
NO SAMPLES AVAILABLE Lmvm to cb next week and check for samples

## 2016-11-09 NOTE — Telephone Encounter (Signed)
New Message  Patient calling the office for samples of medication:   1.  What medication and dosage are you requesting samples for? Eliquis 5mg    2.  Are you currently out of this medication? Per pt daughter pt is in the donut hole and will need, if possible samples until the end of the year. Pt daughter states she will be out by next week. Please call back to discuss

## 2016-11-10 DIAGNOSIS — I10 Essential (primary) hypertension: Secondary | ICD-10-CM | POA: Diagnosis not present

## 2016-11-10 DIAGNOSIS — J9600 Acute respiratory failure, unspecified whether with hypoxia or hypercapnia: Secondary | ICD-10-CM | POA: Diagnosis not present

## 2016-11-16 ENCOUNTER — Telehealth: Payer: Self-pay | Admitting: Cardiology

## 2016-11-16 ENCOUNTER — Telehealth: Payer: Self-pay | Admitting: *Deleted

## 2016-11-16 MED ORDER — AMIODARONE HCL 200 MG PO TABS: 200.0000 mg | ORAL_TABLET | Freq: Every day | ORAL | 3 refills | Status: DC

## 2016-11-16 MED ORDER — METOPROLOL SUCCINATE ER 25 MG PO TB24: 25.0000 mg | ORAL_TABLET | Freq: Two times a day (BID) | ORAL | 3 refills | Status: DC

## 2016-11-16 MED ORDER — APIXABAN 5 MG PO TABS: 5.0000 mg | ORAL_TABLET | Freq: Two times a day (BID) | ORAL | 3 refills | Status: DC

## 2016-11-16 NOTE — Telephone Encounter (Signed)
REQUESTING  -PATIENT REFERRAL FORM TO BE COMPLETED --    INFORMATION  COMPLETED -  DR HARDING ONLY RESPONSIBLE FOR CARDIAC MEDICATION    AMIODARONE 200 MG QD APIXABAN 5 MG QD METOPROLOL SUCCINATE 25 MG BID.     MEDICATION LIST , PRESCRIPTION , AN COMPLETED FORM WILL BE MAILED AFTER DR [facility] SIGNS

## 2016-11-16 NOTE — Telephone Encounter (Signed)
Returned call to [place] - notified her no samples are available. She states patient is in donut hole and med will cost her $400. She states they enlisted help from [place] to inquire about patient assistance. Advised to call back tomorrow to check on samples. Informed her that sometimes pharmacy will let her buy a short supply of med as well.

## 2016-11-16 NOTE — Telephone Encounter (Signed)
°  New Prob  Patient calling the office for samples of medication:   1.  What medication and dosage are you requesting samples for? Eliquis 5 mg  2.  Are you currently out of this medication? 1-2 pills left

## 2016-11-20 ENCOUNTER — Telehealth: Payer: Self-pay | Admitting: Cardiology

## 2016-11-20 NOTE — Telephone Encounter (Signed)
Patient calling the office for samples of medication:   1.  What medication and dosage are you requesting samples for?Amanda Sellers-was told to call back 2.  Are you currently out of this medication?

## 2016-11-20 NOTE — Telephone Encounter (Signed)
Returned call to patient's daughter Amanda Sellers no answer.Left message on personal voice mail office out of Eliquis samples.

## 2016-11-27 NOTE — Telephone Encounter (Addendum)
Prescription obtained to be sent [place] patient asistance program .information faxed.

## 2016-12-06 ENCOUNTER — Ambulatory Visit: Payer: PPO | Admitting: Cardiology

## 2016-12-06 ENCOUNTER — Encounter: Payer: Self-pay | Admitting: Cardiology

## 2016-12-06 VITALS — BP 120/68 | HR 52 | Ht 66.0 in | Wt 194.0 lb

## 2016-12-06 DIAGNOSIS — E782 Mixed hyperlipidemia: Secondary | ICD-10-CM

## 2016-12-06 DIAGNOSIS — I5032 Chronic diastolic (congestive) heart failure: Secondary | ICD-10-CM

## 2016-12-06 DIAGNOSIS — Z79899 Other long term (current) drug therapy: Secondary | ICD-10-CM | POA: Diagnosis not present

## 2016-12-06 DIAGNOSIS — I480 Paroxysmal atrial fibrillation: Secondary | ICD-10-CM | POA: Diagnosis not present

## 2016-12-06 DIAGNOSIS — I10 Essential (primary) hypertension: Secondary | ICD-10-CM

## 2016-12-06 MED ORDER — APIXABAN 5 MG PO TABS: 5.0000 mg | ORAL_TABLET | Freq: Two times a day (BID) | ORAL | 0 refills | Status: DC

## 2016-12-06 NOTE — Progress Notes (Signed)
PCP: Tammi Sou, MD  Clinic Note: Chief Complaint  Patient presents with  . Follow-up  . Atrial Fibrillation    HPI: Amanda Sellers is a 83 y.o. female with a PMH below who presents today for six-month follow-up for atrial fibrillation, hypertensive heart disease with diastolic heart failure resulting from difficult to control hypertension.  She is on amiodarone for rhythm control and Eliquis for anticoagulation.   Amanda Sellers was last seen on May 15, 2016 -her daughter for routine follow-up and had no major complaints.  She had a few episodes of breathing difficulty/shaking her heart rate was may be going fast.  This actually was treated with Klonopin by her pulmonologist.  She also noted some mild edema.  No medication changes made.  Recent Hospitalizations: None  Studies Personally Reviewed - (if available, images/films reviewed: From Epic Chart or Care Everywhere)  None  Interval History: [name] presents today overall doing fairly well.  She says that she is now having to walk with a walker, but is starting to get more steady.  She indicates that she gets out of breath relatively easily, for instance just simply walking into the office, but she tells me that is probably more because her knee hurts her more so than actually having issues with breathing.  She has no resting dyspnea.  She sleeps on 2 pillows for comfort because it helps her knee pain but denies any PND.  She only has swelling around her knees and ankles, but more related to her knees hurting.  She really has not had any more of the shaking episodes anymore.  No rapid irregular heartbeats or palpitations.   She says she does have some bruising but is not that bad.  No chest pain or shortness of breath with rest or exertion.  She does have bad balance issues, but does not have any unilateral weakness.   She has not had any symptoms of syncope/near syncope, or TIA/amaurosis fugax symptoms. No melena,  hematochezia, hematuria, or epstaxis. No claudication.  ROS: A comprehensive was performed. Review of Systems  Constitutional: Negative for malaise/fatigue.  HENT: Negative for congestion.   Respiratory: Positive for shortness of breath (Because her knees hurt). Negative for cough and wheezing.   Cardiovascular: Positive for leg swelling.  Musculoskeletal: Positive for joint pain (Both knees).  Psychiatric/Behavioral: The patient is nervous/anxious.   All other systems reviewed and are negative.  I have reviewed and (if needed) personally updated the patient's problem list, medications, allergies, past medical and surgical history, social and family history.   Past Medical History:  Diagnosis Date  . Anticoagulation adequate, Eliquis 10/16/2014  . Anxiety    clonaz helps this AND her breathing  . Atrial fibrillation ([place])    With RVR; elec cardioversion 11/30/14.  On Amio since 10/2014; Dr. Lenna Gilford following her from pulm standpoint regarding this med.  . Chronic diastolic congestive heart failure ([place]) 26/71/2458   Complicated by atrial fibrillation; Echo 07/23/14: mild LVH, EF 50-55%, grade 2 diast dysfxn  . Chronic renal insufficiency, stage III (moderate) (HCC)    GFR @ 50  . COPD (chronic obstructive pulmonary disease) ([place])    recently noted on Xray, does not have any problems  . Family history of adverse reaction to anesthesia    daughter has difficulty waking up  . Follicular lymphoma ([place])    Non Hodgkins B cell lymphoma; s/p chemo summer 2016--in remission as of 03/2016.  Marland Kitchen HLD (hyperlipidemia)     06/2014  .  HTN (hypertension)   . Hypothyroidism   . Lip cancer    Dr. Janace Hoard excised this: invasive SCC--no sign of cancer at ENT f/u 10/2015  . Microcytic anemia    transfused 3 U total in hosp 07/2014  . Olecranon bursitis of right elbow 02/2015  . Pancytopenia due to antineoplastic chemotherapy ([place])   . Positive occult stool blood test 08/08/14   Endoscopies ok 08/2014  .  Primary osteoarthritis of both knees 04/2016   Dr. Gladstone Lighter: bone on bone--to get euflexxa to both knees.  . Pulmonary metastases ([place]) 07/24/2014  . Shortness of breath dyspnea    with exertion.  Dr. Lenna Gilford started her on clonazepam to try to relax her chest wall and allow more satisfying deep breath. (03/2016).  . Vitamin B 12 deficiency   . Vitamin D deficiency     Past Surgical History:  Procedure Laterality Date  . ABDOMINAL HYSTERECTOMY  1978  . BONE MARROW BIOPSY  07/24/14  . BREAST BIOPSY    . CARDIOVERSION N/A 10/16/2014   Procedure: CARDIOVERSION;  Surgeon: Lelon Perla, MD;  [facility] ENDOSCOPY;  Service: Cardiovascular;  Laterality: N/A;  . CARDIOVERSION N/A 11/30/2014   Procedure: CARDIOVERSION;  Surgeon: Larey Dresser, MD;  [facility] END[facility]COPY;  Service: Cardiovascular;  Laterality: N/A;  . COLONOSCOPY  09/23/14   small hemorrhoids, otherwise normal (performed for IDA and heme+ stool)  . COLONOSCOPY    . MASS EXCISION Left 07/15/2015   Left lower lip mass--invasive SCC w/negative margins.  Procedure: EXCISION MASS;  Surgeon: Melissa Montane, MD;  Location: [facility];  Service: ENT;  Laterality: Left;  Wedge excision left lower lip mass  . NM MYOVIEW LTD  10/29/2014   medium size mild surgery defect in the mid anterior and apical anterior location suggestive of breast attenuation. No reversibility. LOW RISK  . PFTs  02/2015   Restriction with diffusion defect: cardiologist referred her to pulm to help interpret PFTs and decide whether she has amiodarone toxicity  . TEE WITHOUT CARDIOVERSION N/A 10/16/2014   Procedure: TRANSESOPHAGEAL ECHOCARDIOGRAM (TEE);  Surgeon: Lelon Perla, MD;  Location: [facility] ENDOSCOPY;  Service: Cardiovascular;  Laterality: N/A;  . TRANSTHORACIC ECHOCARDIOGRAM  07/23/14   mild LVH, EF 50-55%, grade 2 diast dysfxn  . UPPER GI ENDOSCOPY  09/23/2014   small hiatus hernia, nodules in stomach biopsied (chronic active erosive atrophic gastritis with intestinal  metaplasia--no dysplasia or malignancy) otherwise normal    Current Meds  Medication Sig  . ALPRAZolam (XANAX) 0.5 MG tablet 1 tab po qhs prn anxiety-related insomnia  . amiodarone (PACERONE) 200 MG tablet Take 1 tablet (200 mg total) by mouth daily.  Marland Kitchen apixaban (ELIQUIS) 5 MG TABS tablet Take 1 tablet (5 mg total) 2 (two) times daily by mouth.  . clonazePAM (KLONOPIN) 0.5 MG tablet Take 1/2 to 1 tablet by mouth two times daily as needed for shortness of breath  . cyanocobalamin (,VITAMIN B-12,) 1000 MCG/ML injection Inject 1 mL (1,000 mcg total) into the skin every 30 (thirty) days.  . ferrous sulfate 325 (65 FE) MG tablet Take 1 tablet (325 mg total) by mouth 2 (two) times daily with a meal.  . fish oil-omega-3 fatty acids 1000 MG capsule Take 2 g by mouth daily.  . furosemide (LASIX) 40 MG tablet TAKE ONE TABLET BY MOUTH TWICE DAILY  . levothyroxine (SYNTHROID, LEVOTHROID) 100 MCG tablet TAKE ONE TABLET BY MOUTH DAILY BEFORE BREAKFAST  . metoprolol succinate (TOPROL-XL) 25 MG 24 hr tablet Take 1  tablet (25 mg total) by mouth 2 (two) times daily.  . potassium chloride 20 MEQ TBCR Take 20 mEq by mouth daily.  . pravastatin (PRAVACHOL) 80 MG tablet TAKE ONE TABLET BY MOUTH DAILY  . prochlorperazine (COMPAZINE) 10 MG tablet Take 10 mg by mouth every 8 (eight) hours as needed for nausea or vomiting. Reported on 08/05/2015  . traMADol (ULTRAM) 50 MG tablet Take 1 tablet (50 mg total) by mouth 2 (two) times daily. For arthritis pain  . Vitamin D, Ergocalciferol, (DRISDOL) 50000 units CAPS capsule TAKE 1 CAPSULE BY MOUTH EVERY SUNDAY  . [DISCONTINUED] apixaban (ELIQUIS) 5 MG TABS tablet Take 1 tablet (5 mg total) by mouth 2 (two) times daily.    Allergies  Allergen Reactions  . Iodinated Diagnostic Agents Rash and Other (See Comments)    CT Contrast    Social History   Socioeconomic History  . Marital status: Married    Spouse name: None  . Number of children: 8  . Years of education:  None  . Highest education level: None  Social Needs  . Financial resource strain: None  . Food insecurity - worry: None  . Food insecurity - inability: None  . Transportation needs - medical: None  . Transportation needs - non-medical: None  Occupational History  . Occupation: retired  Tobacco Use  . Smoking status: Never Smoker  . Smokeless tobacco: Never Used  Substance and Sexual Activity  . Alcohol use: No    Alcohol/week: 0.0 oz  . Drug use: No  . Sexual activity: None  Other Topics Concern  . None  Social History Narrative   Widowed, has 8 children.   Orig from [place], now lives in [place].   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   No tob/alc/drugs.    family history includes Diabetes in her daughter; Lymphoma in her sister; Melanoma in her mother; Stomach cancer in her father.  Wt Readings from Last 3 Encounters:  12/06/16 194 lb (88 kg)  11/06/16 195 lb 12 oz (88.8 kg)  10/09/16 194 lb 12.8 oz (88.4 kg)    PHYSICAL EXAM BP 120/68   Pulse (!) 52   Ht _0  (1.676 m)   Wt 194 lb (88 kg)   BMI 31.31 kg/m  Physical Exam  Constitutional: She is oriented to person, place, and time. She appears well-developed and well-nourished. No distress.  HENT:  Head: Normocephalic and atraumatic.  Neck: No hepatojugular reflux and no JVD present. Carotid bruit is not present.  Cardiovascular: Normal rate, regular rhythm and intact distal pulses.  No extrasystoles are present. PMI is not displaced. Exam reveals no gallop and no friction rub.  Murmur heard.  Medium-pitched harsh crescendo-decrescendo early systolic murmur is present with a grade of 2/6 at the upper right sternal border. Pulmonary/Chest: Effort normal. No respiratory distress. She has no wheezes. She has no rales.  Mild diffuse interstitial sounds.  Abdominal: Soft. Bowel sounds are normal. She exhibits no distension. There is no tenderness. There is no rebound.    Musculoskeletal: Normal range of motion.  Neurological: She is alert and oriented to person, place, and time.  Skin: Skin is warm and dry. No rash noted. No erythema.  Psychiatric: She has a normal mood and affect. Her behavior is normal. Judgment and thought content normal.  Nursing note and vitals reviewed.    Adult ECG Report Not checked  Other studies Reviewed: Additional studies/ records that were reviewed  today include:  Recent Labs:   Lab Results  Component Value Date   CHOL 199 07/03/2016   HDL 64.40 07/03/2016   LDLCALC 100 (H) 07/03/2016   LDLDIRECT 110.0 02/07/2016   TRIG 176.0 (H) 07/03/2016   CHOLHDL 3 07/03/2016   Lab Results  Component Value Date   CREATININE 0.84 11/06/2016   BUN 12 11/06/2016   NA 142 11/06/2016   K 4.1 11/06/2016   CL 101 11/06/2016   CO2 33 (H) 11/06/2016   Lab Results  Component Value Date   WBC 4.8 10/09/2016   HGB 10.6 (L) 10/09/2016   HCT 33.0 (L) 10/09/2016   MCV 81.6 10/09/2016   PLT 184 10/09/2016    Lab Results  Component Value Date   TSH 4.17 07/03/2016    ASSESSMENT / PLAN: Problem List Items Addressed This Visit    Chronic diastolic CHF (congestive heart failure), NYHA class 2 (HCC) (Chronic)    Stable on current dose of Lasix.  She seems relatively euvolemic currently.   She has been taking them in the morning and in the evening as opposed to earlier in the afternoon.  I recommend that she takes her second dose by 2:00 in the afternoon to avoid nocturia. Blood pressure looks great on current dose of Toprol.  Not on afterload reduction.      Relevant Medications   apixaban (ELIQUIS) 5 MG TABS tablet   Essential hypertension (Chronic)    Well-controlled blood pressure on current dose of beta-blocker.  We are allowing for mild permissive hypertension to avoid orthostatic symptoms.      Relevant Medications   apixaban (ELIQUIS) 5 MG TABS tablet   Hyperlipidemia (Chronic)    Reasonable lipids for current dose of  statin. Given her age, I do not think we need to be much more aggressive.      Relevant Medications   apixaban (ELIQUIS) 5 MG TABS tablet   On amiodarone therapy (Chronic)    She is having her PFTs monitored by her pulmonologist.  She does continue to require LFTs and TFTs as well as eye exams.      Paroxysmal atrial fibrillation with RVR ([place]): CHA2DS2-VASc Score 4 - Primary (Chronic)    Overall seems to be relatively well controlled on current regimen.  Pretty much because of her symptomatic episodes of A. fib, we have kept her on amiodarone. Continue Toprol as well for additional rate control.  Seems to be maintaining sinus rhythm. No real bruising on Eliquis.      Relevant Medications   apixaban (ELIQUIS) 5 MG TABS tablet      Current medicines are reviewed at length with the patient today. (+/- concerns) n/a The following changes have been made: n/a  Patient Instructions  NO CHANGES WITH CURRENT MEDICATIONS      Your physician wants you to follow-up in [place].You will receive a reminder letter in the mail two months in advance. If you don't receive a letter, please call our office to schedule the follow-up appointment.     If you need a refill on your cardiac medications before your next appointment, please call your pharmacy.    Studies Ordered:   No orders of the defined types were placed in this encounter.     Glenetta Hew, M.D., M.S. Interventional Cardiologist   Pager # [phone] Phone # [phone] [address] [place], [place] [address]

## 2016-12-06 NOTE — Patient Instructions (Addendum)
NO CHANGES WITH CURRENT MEDICATIONS    Your physician wants you to follow-up in 6 MONTHS WITH DR HARDING.You will receive a reminder letter in the mail two months in advance. If you don't receive a letter, please call our office to schedule the follow-up appointment.    If you need a refill on your cardiac medications before your next appointment, please call your pharmacy.  

## 2016-12-07 ENCOUNTER — Encounter: Payer: Self-pay | Admitting: Cardiology

## 2016-12-07 NOTE — Assessment & Plan Note (Signed)
Overall seems to be relatively well controlled on current regimen.  Pretty much because of her symptomatic episodes of A. fib, we have kept her on amiodarone. Continue Toprol as well for additional rate control.  Seems to be maintaining sinus rhythm. No real bruising on Eliquis.

## 2016-12-07 NOTE — Assessment & Plan Note (Signed)
She is having her PFTs monitored by her pulmonologist.  She does continue to require LFTs and TFTs as well as eye exams.

## 2016-12-07 NOTE — Assessment & Plan Note (Addendum)
Stable on current dose of Lasix.  She seems relatively euvolemic currently.   She has been taking them in the morning and in the evening as opposed to earlier in the afternoon.  I recommend that she takes her second dose by 2:00 in the afternoon to avoid nocturia. Blood pressure looks great on current dose of Toprol.  Not on afterload reduction.

## 2016-12-07 NOTE — Assessment & Plan Note (Signed)
Well-controlled blood pressure on current dose of beta-blocker.  We are allowing for mild permissive hypertension to avoid orthostatic symptoms.

## 2016-12-07 NOTE — Assessment & Plan Note (Signed)
Reasonable lipids for current dose of statin. Given her age, I do not think we need to be much more aggressive.

## 2016-12-11 DIAGNOSIS — J9600 Acute respiratory failure, unspecified whether with hypoxia or hypercapnia: Secondary | ICD-10-CM | POA: Diagnosis not present

## 2016-12-11 DIAGNOSIS — I10 Essential (primary) hypertension: Secondary | ICD-10-CM | POA: Diagnosis not present

## 2016-12-19 ENCOUNTER — Encounter: Payer: Self-pay | Admitting: Family Medicine

## 2016-12-25 ENCOUNTER — Telehealth: Payer: Self-pay | Admitting: Cardiology

## 2016-12-25 NOTE — Telephone Encounter (Signed)
She wants to know if the form for her to get help with her Eliquis have bent sent to [facility] Dept?Marland Kitchen [facility] said they still have not received it. If you dont mind,would you please refax that form.

## 2016-12-25 NOTE — Telephone Encounter (Signed)
Patient has been made aware that samples are available at the front.  Medication Samples have been provided to the patient.  Drug name: Eliquis        Strength: 5 mg         Qty: 2 boxes   LOT: [id number]   Exp.Date: 12/20

## 2016-12-25 NOTE — Telephone Encounter (Signed)
Patient calling the office for samples of medication:   1.  What medication and dosage are you requesting samples for? Amanda Sellers  2.  Are you currently out of this medication? Trying to get assistance

## 2016-12-25 NOTE — Telephone Encounter (Signed)
Returned the call to the patient's daughter who is not listed on the [place]. Per the patient, it is okay to speak with her daughter Erasmo Downer. The daughter has been made aware that the papers will be re faxed per her reuqest

## 2016-12-26 ENCOUNTER — Telehealth: Payer: Self-pay | Admitting: Cardiology

## 2016-12-26 NOTE — Telephone Encounter (Signed)
FAXED  FORM TO  THE GIVEN FAX NUMBER- [place]

## 2016-12-26 NOTE — Telephone Encounter (Signed)
New Message  Hilda Blades from [facility] call requesting to speak with RN. She states she faxed over some forms for Dr. Ellyn Hack to fill out and return back to her. Please call back to discuss if needed.

## 2016-12-26 NOTE — Telephone Encounter (Signed)
Returned call to [place] with [facility] Dept.She stated she faxed a [place] form to office 11/27/16 for Dr.Harding to sign.She needs form signed and faxed back to her at fax # [phone].Advised I will send message to Dr.Harding's RN.

## 2016-12-27 NOTE — Telephone Encounter (Signed)
VERIFIED AND RECEIVED

## 2017-01-10 DIAGNOSIS — I10 Essential (primary) hypertension: Secondary | ICD-10-CM | POA: Diagnosis not present

## 2017-01-10 DIAGNOSIS — J9600 Acute respiratory failure, unspecified whether with hypoxia or hypercapnia: Secondary | ICD-10-CM | POA: Diagnosis not present

## 2017-01-12 ENCOUNTER — Telehealth: Payer: Self-pay | Admitting: Cardiology

## 2017-01-12 MED ORDER — APIXABAN 5 MG PO TABS: 5.0000 mg | ORAL_TABLET | Freq: Two times a day (BID) | ORAL | 5 refills | Status: DC

## 2017-01-12 NOTE — Telephone Encounter (Signed)
no samples available, pt notified

## 2017-01-12 NOTE — Telephone Encounter (Signed)
New Message  Patient calling the office for samples of medication:   1.  What medication and dosage are you requesting samples for? eliquis 5mg    2.  Are you currently out of this medication? Yes  Pt in the donut

## 2017-01-12 NOTE — Telephone Encounter (Signed)
Per [facility] refill:No medication left, please call back (Routing comment)  Pt daughter notified, rx sent as requested

## 2017-01-24 ENCOUNTER — Other Ambulatory Visit (HOSPITAL_COMMUNITY): Payer: Self-pay | Admitting: Nurse Practitioner

## 2017-01-26 ENCOUNTER — Encounter (HOSPITAL_COMMUNITY): Payer: Self-pay | Admitting: Nurse Practitioner

## 2017-01-26 ENCOUNTER — Ambulatory Visit (HOSPITAL_COMMUNITY)
Admission: RE | Admit: 2017-01-26 | Discharge: 2017-01-26 | Disposition: A | Discharge status: Home / Self Care | Payer: PPO | Source: Ambulatory Visit | Attending: Nurse Practitioner | Admitting: Nurse Practitioner

## 2017-01-26 VITALS — BP 102/52 | HR 46 | Ht 66.0 in | Wt 195.0 lb

## 2017-01-26 DIAGNOSIS — Z91041 Radiographic dye allergy status: Secondary | ICD-10-CM | POA: Insufficient documentation

## 2017-01-26 DIAGNOSIS — Z8572 Personal history of non-Hodgkin lymphomas: Secondary | ICD-10-CM | POA: Diagnosis not present

## 2017-01-26 DIAGNOSIS — I130 Hypertensive heart and chronic kidney disease with heart failure and stage 1 through stage 4 chronic kidney disease, or unspecified chronic kidney disease: Secondary | ICD-10-CM | POA: Insufficient documentation

## 2017-01-26 DIAGNOSIS — Z7901 Long term (current) use of anticoagulants: Secondary | ICD-10-CM | POA: Diagnosis not present

## 2017-01-26 DIAGNOSIS — Z808 Family history of malignant neoplasm of other organs or systems: Secondary | ICD-10-CM | POA: Insufficient documentation

## 2017-01-26 DIAGNOSIS — R0609 Other forms of dyspnea: Secondary | ICD-10-CM

## 2017-01-26 DIAGNOSIS — K449 Diaphragmatic hernia without obstruction or gangrene: Secondary | ICD-10-CM | POA: Insufficient documentation

## 2017-01-26 DIAGNOSIS — J449 Chronic obstructive pulmonary disease, unspecified: Secondary | ICD-10-CM | POA: Insufficient documentation

## 2017-01-26 DIAGNOSIS — E039 Hypothyroidism, unspecified: Secondary | ICD-10-CM | POA: Diagnosis not present

## 2017-01-26 DIAGNOSIS — Z9981 Dependence on supplemental oxygen: Secondary | ICD-10-CM | POA: Diagnosis not present

## 2017-01-26 DIAGNOSIS — I5032 Chronic diastolic (congestive) heart failure: Secondary | ICD-10-CM | POA: Insufficient documentation

## 2017-01-26 DIAGNOSIS — D509 Iron deficiency anemia, unspecified: Secondary | ICD-10-CM | POA: Diagnosis not present

## 2017-01-26 DIAGNOSIS — E785 Hyperlipidemia, unspecified: Secondary | ICD-10-CM | POA: Insufficient documentation

## 2017-01-26 DIAGNOSIS — Z807 Family history of other malignant neoplasms of lymphoid, hematopoietic and related tissues: Secondary | ICD-10-CM | POA: Insufficient documentation

## 2017-01-26 DIAGNOSIS — N183 Chronic kidney disease, stage 3 (moderate): Secondary | ICD-10-CM | POA: Diagnosis not present

## 2017-01-26 DIAGNOSIS — Z833 Family history of diabetes mellitus: Secondary | ICD-10-CM | POA: Diagnosis not present

## 2017-01-26 DIAGNOSIS — I481 Persistent atrial fibrillation: Secondary | ICD-10-CM | POA: Diagnosis not present

## 2017-01-26 DIAGNOSIS — Z9889 Other specified postprocedural states: Secondary | ICD-10-CM | POA: Diagnosis not present

## 2017-01-26 DIAGNOSIS — Z800 Family history of malignant neoplasm of digestive organs: Secondary | ICD-10-CM | POA: Diagnosis not present

## 2017-01-26 DIAGNOSIS — F419 Anxiety disorder, unspecified: Secondary | ICD-10-CM | POA: Diagnosis not present

## 2017-01-26 DIAGNOSIS — M7021 Olecranon bursitis, right elbow: Secondary | ICD-10-CM | POA: Insufficient documentation

## 2017-01-26 DIAGNOSIS — Z9221 Personal history of antineoplastic chemotherapy: Secondary | ICD-10-CM | POA: Insufficient documentation

## 2017-01-26 DIAGNOSIS — R0602 Shortness of breath: Secondary | ICD-10-CM | POA: Diagnosis not present

## 2017-01-26 DIAGNOSIS — R0600 Dyspnea, unspecified: Secondary | ICD-10-CM | POA: Insufficient documentation

## 2017-01-26 DIAGNOSIS — Z90710 Acquired absence of both cervix and uterus: Secondary | ICD-10-CM | POA: Insufficient documentation

## 2017-01-26 DIAGNOSIS — E559 Vitamin D deficiency, unspecified: Secondary | ICD-10-CM | POA: Diagnosis not present

## 2017-01-26 DIAGNOSIS — Z79891 Long term (current) use of opiate analgesic: Secondary | ICD-10-CM | POA: Insufficient documentation

## 2017-01-26 DIAGNOSIS — Z79899 Other long term (current) drug therapy: Secondary | ICD-10-CM | POA: Diagnosis not present

## 2017-01-26 MED ORDER — METOPROLOL SUCCINATE ER 25 MG PO TB24: 12.5000 mg | ORAL_TABLET | Freq: Every day | ORAL | 3 refills | Status: DC

## 2017-01-26 MED ORDER — AMIODARONE HCL 200 MG PO TABS: 100.0000 mg | ORAL_TABLET | Freq: Every day | ORAL | 3 refills | Status: DC

## 2017-01-26 NOTE — Progress Notes (Signed)
Patient ID: Amanda Sellers, female   DOB: September 15, 1932, 84 y.o.   MRN: [id number]           Primary Care Physician: Tammi Sou, MD Referring Physician: Dr. Ronni Rumble Amanda Sellers is a 84 y.o. female with a h/o  diastolic heart failure, afib, newly diagnosed with recent dx of lymphoma. She was admitted to [facility] 9/22 on IV dilt and given diuretics for acute on chronic systolic HF. It was arranged for to undergo TEE DCCV.  She had exertional dyspnea with PND & orthopnea.She felt very tired & had little energy. She  noted a few palpitations, but does not recall rapid irregular heart rates/rhythms.   She had TEE without thrombus and underwent DCCV that was successful with one shock of 120J. She did well post procedure and was seen by Dr. Jerilynn Mages. Croitoru and found stable for discharge. She was negative 2700cc since admit and weight was down 4 lbs with IV diuretics. D/C wt is 168.  Plan on d/c was for  uninterrupted anticoagulation for next 30 days, preferably lifelong barring bleeding complications. AFib clinic follow up in 1-2 weeks. Outpatient [facility], non-urgent. ASA stopped.   She was seen in the afib clinic, 10/10 and found to be in afib with rvr. She went into afib short time after cardioversion. EKG showed afib at 130-140 bpm. She was  not on any drugs for rate control.  Is taking DOAC without fail. Continues with chemotherapy for lymphoma.   She was started on BB and  returned to clinic 10/12 with better v rates. 80's with sitting and 90-100's with activity. She does not have any PND/orthopnea although she has slept better in her recliner for months now.No lower extremity edema. Her weight is stable at home.  She has  finished chemotherapy for non- hodgkin's lymphoma and had f/u Pet scan 10/26,  and CA appears to be in remission, so per pt, no more chemo for right now. She has been on DOAC now x one month so will start attempts to restore SR. Discussed options  amiodarone or tikosyn and she would prefer amiodarone as she has two neighbors on amiodarone and they are both in rhythm and tolerating drug well. She is not interested in a hospital stay for drug loading. She reports rash with recent contrast dye. Amiodarone was started 200 mg bid, to then go down to 200 mg a day and then would discuss cardioversion.  She asked to be seen 11/2,  in the afib clinic for increased shortness of breath yesterday and last night. She has slept in the recliner for months but did not feel as well last night and slept poorly, feeling more short of breath and fullness in her chest.She has noticed increased pedal edema. She did not take her amiodarone last night or this am. Ekg shows rate controlled afib at 82 bpm, qtc at 464 ms.PO on RA is 98%. Weight is up 2 lbs from last visit, 6 lbs since 10/17. Lasix dose was decreased to 40 mg daily from 40 mg am and 20 mg pm when seen by Dr. Ellyn Hack 10/22.. Pt has noticed weight climbing for several days. She is fairly comfortable when quiet and sitting.   She was asked to double lasix to 80 mg  x 3 days, and to reduce amiodarone 200 mg a day. She had lost 3 pounds on return to clinic several days later. Clinically, she is much improved with more energy and less shortness of  breath. Able to sleep well. Had enough energy to walk into clinic today without wheelchair and felt well enough to clean her house yesterday. Ideally,I would like to load amiodarone a while longer but will go ahead and  pursue DCCV sooner than later, as it is contributing to her heart failure. Compliant with blood thinner without missed doses On recent labs, TSH was elevated but daughter explained that she just started taking replacement correctly on an empty stomach, and sees PCP 11/16 and she expects him to adjust the dose.  11/14-  Returns to afib clinic with a smile on her face and reporting all the activities she has been doing over the last week, having returned to  sinus rhythm after cardioversion. Much more energy and breathing is much improved.  Fluid status is stable.  12/13- Returns to afib clinic for f/u and reports doing well No afib, no further shortness of breath or edema. She is back to her usual activities. Got a good report form the Ca unit recently. Tolerating amiodarone 200 mg a day.  Return to afib clinic 4/18, reports that she is staying in [place]. Saw pulmonology yesterday for some c/o of shortness of breath but breathing much improved since staying in [place]. Xray no active process, thyroid, liver studies drawn yesterday, and normal. MD said he would call hr after results of tests were reviewed. Fluid status normal.  F/u in afib clinic 01/31/16, pt continues to do well without any afib. Her fluid status is normal. Last TSH, 3 months ago, showed mild increase and will check TSH and liver panel today. Her breathing is good other than the last week in the bitter cold weather  she does not breath as good outside.No bleeding issues with eliquis. Fluid status is stable on lasix.  F/u in afib 01/26/17  for shortness of breath. Pt has been having shortness of breath for some while but contributed it to her knees and difficultly in ambulating. She was seen by pulmonology in July with improved PFT's and was OK to continue amiodarone. She was seen in November by Dr. Ellyn Hack and shortness of breath was thought to be stable/chronic. She feels that her shortness of breath has worsened since Christmas.  If she walks to the bathroom and back she is entirely out of breath. Sleeps on 2 pillows and has not felt the need to increase to elevate her head more at night. Ekg today shows junctional rhythm at 46 bpm. She is breathless speaking and started walking to clinic room but had to stop and get w/c. PO 99%.  Today, she denies symptoms of palpitations, chest pain,  orthopnea, PND, mild chronic lower extremity edema, no dizziness, presyncope, syncope, or neurologic sequela.   Worsening dyspnea. The patient is tolerating medications without difficulties and is otherwise without complaint today.   Past Medical History:  Diagnosis Date  . Anticoagulation adequate, Eliquis 10/16/2014  . Anxiety    clonaz helps this AND her breathing  . Atrial fibrillation ([place])    With RVR; elec cardioversion 11/30/14.  On Amio since 10/2014; Dr. Lenna Gilford following her from pulm standpoint regarding this med.  . Chronic diastolic congestive heart failure ([place]) 37/90/2409   Complicated by atrial fibrillation; Echo 07/23/14: mild LVH, EF 50-55%, grade 2 diast dysfxn.  NYHA class II.  Marland Kitchen Chronic renal insufficiency, stage III (moderate) (HCC)    GFR @ 50  . COPD (chronic obstructive pulmonary disease) ([place])    recently noted on Xray, does not have any problems  .  Family history of adverse reaction to anesthesia    daughter has difficulty waking up  . Follicular lymphoma ([place])    Non Hodgkins B cell lymphoma; s/p chemo summer 2016--in remission as of 03/2016.  Marland Kitchen HLD (hyperlipidemia)     06/2014  . HTN (hypertension)   . Hypothyroidism   . Lip cancer    Dr. Janace Hoard excised this: invasive SCC--no sign of cancer at ENT f/u 10/2015  . Microcytic anemia    transfused 3 U total in hosp 07/2014  . Olecranon bursitis of right elbow 02/2015  . Pancytopenia due to antineoplastic chemotherapy ([place])   . Positive occult stool blood test 08/08/14   Endoscopies ok 08/2014  . Primary osteoarthritis of both knees 04/2016   Dr. Gladstone Lighter: bone on bone--to get euflexxa to both knees.  . Pulmonary metastases ([place]) 07/24/2014  . Shortness of breath dyspnea    with exertion.  Dr. Lenna Gilford started her on clonazepam to try to relax her chest wall and allow more satisfying deep breath. (03/2016).  . Vitamin B 12 deficiency   . Vitamin D deficiency    Past Surgical History:  Procedure Laterality Date  . ABDOMINAL HYSTERECTOMY  1978  . BONE MARROW BIOPSY  07/24/14  . BREAST BIOPSY    . CARDIOVERSION N/A 10/16/2014    Procedure: CARDIOVERSION;  Surgeon: Lelon Perla, MD;  Location: [facility] ENDOSCOPY;  Service: Cardiovascular;  Laterality: N/A;  . CARDIOVERSION N/A 11/30/2014   Procedure: CARDIOVERSION;  Surgeon: Larey Dresser, MD;  Location[facility] ENDOSCOPY;  Service: Cardiovascular;  Laterality: N/A;  . COLONOSCOPY  09/23/14   small hemorrhoids, otherwise normal (performed for IDA and heme+ stool)  . COLONOSCOPY    . MASS EXCISION Left 07/15/2015   Left lower lip mass--invasive SCC w/negative margins.  Procedure: EXCISION MASS;  Surgeon: Melissa Montane, MD;  Location: [facility];  Service: ENT;  Laterality: Left;  Wedge excision left lower lip mass  . NM MYOVIEW LTD  10/29/2014   medium size mild surgery defect in the mid anterior and apical anterior location suggestive of breast attenuation. No reversibility. LOW RISK  . PFTs  02/2015   Restriction with diffusion defect: cardiologist referred her to pulm to help interpret PFTs and decide whether she has amiodarone toxicity  . TEE WITHOUT CARDIOVERSION N/A 10/16/2014   Procedure: TRANSESOPHAGEAL ECHOCARDIOGRAM (TEE);  Surgeon: Lelon Perla, MD;  Location[facility] ENDOSCOPY;  Service: Cardiovascular;  Laterality: N/A;  . TRANSTHORACIC ECHOCARDIOGRAM  07/23/14   mild LVH, EF 50-55%, grade 2 diast dysfxn  . UPPER GI ENDOSCOPY  09/23/2014   small hiatus hernia, nodules in stomach biopsied (chronic active erosive atrophic gastritis with intestinal metaplasia--no dysplasia or malignancy) otherwise normal    Current Outpatient Medications  Medication Sig Dispense Refill  . ALPRAZolam (XANAX) 0.5 MG tablet 1 tab po qhs prn anxiety-related insomnia 90 tablet 1  . amiodarone (PACERONE) 200 MG tablet Take 0.5 tablets (100 mg total) by mouth daily. 90 tablet 3  . apixaban (ELIQUIS) 5 MG TABS tablet Take 1 tablet (5 mg total) by mouth 2 (two) times daily. 20 tablet 5  . clonazePAM (KLONOPIN) 0.5 MG tablet Take 1/2 to 1 tablet by mouth two times daily as needed for shortness of breath  60 tablet 5  . cyanocobalamin (,VITAMIN B-12,) 1000 MCG/ML injection Inject 1 mL (1,000 mcg total) into the skin every 30 (thirty) days. 1 mL 12  . ferrous sulfate 325 (65 FE) MG tablet Take 1 tablet (325 mg total) by mouth  2 (two) times daily with a meal. 30 tablet 1  . fish oil-omega-3 fatty acids 1000 MG capsule Take 2 g by mouth daily.    . furosemide (LASIX) 40 MG tablet TAKE ONE TABLET BY MOUTH TWICE DAILY 60 tablet 10  . levothyroxine (SYNTHROID, LEVOTHROID) 100 MCG tablet TAKE ONE TABLET BY MOUTH DAILY BEFORE BREAKFAST 90 tablet 1  . metoprolol succinate (TOPROL-XL) 25 MG 24 hr tablet Take 0.5 tablets (12.5 mg total) by mouth daily. 180 tablet 3  . OXYGEN Inhale into the lungs. 1Lt at bedtime    . potassium chloride 20 MEQ TBCR Take 20 mEq by mouth daily. 30 tablet 3  . pravastatin (PRAVACHOL) 80 MG tablet TAKE ONE TABLET BY MOUTH DAILY 90 tablet 3  . prochlorperazine (COMPAZINE) 10 MG tablet Take 10 mg by mouth every 8 (eight) hours as needed for nausea or vomiting. Reported on 08/05/2015    . traMADol (ULTRAM) 50 MG tablet Take 1 tablet (50 mg total) by mouth 2 (two) times daily. For arthritis pain (Patient not taking: Reported on 01/26/2017) 60 tablet 5  . Vitamin D, Ergocalciferol, (DRISDOL) 50000 units CAPS capsule TAKE 1 CAPSULE BY MOUTH EVERY SUNDAY (Patient not taking: Reported on 01/26/2017) 12 capsule 1   No current facility-administered medications for this encounter.     Allergies  Allergen Reactions  . Iodinated Diagnostic Agents Rash and Other (See Comments)    CT Contrast    Social History   Socioeconomic History  . Marital status: Married    Spouse name: Not on file  . Number of children: 8  . Years of education: Not on file  . Highest education level: Not on file  Social Needs  . Financial resource strain: Not on file  . Food insecurity - worry: Not on file  . Food insecurity - inability: Not on file  . Transportation needs - medical: Not on file  .  Transportation needs - non-medical: Not on file  Occupational History  . Occupation: retired  Tobacco Use  . Smoking status: Never Smoker  . Smokeless tobacco: Never Used  Substance and Sexual Activity  . Alcohol use: No    Alcohol/week: 0.0 oz  . Drug use: No  . Sexual activity: Not on file  Other Topics Concern  . Not on file  Social History Narrative   Widowed, has 8 children.   Orig from [place], now lives in [place].   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   No tob/alc/drugs.    Family History  Problem Relation Age of Onset  . Melanoma Mother   . Stomach cancer Father   . Lymphoma Sister   . Diabetes Daughter     ROS- All systems are reviewed and negative except as per the HPI above  Physical Exam: Vitals:   01/26/17 1050  BP: (!) 102/52  Pulse: (!) 46  SpO2: 98%  Weight: 195 lb (88.5 kg)  Height: '5\' 6"'$  (1.676 m)    GEN- The patient is well appearing, alert and oriented x 3 today.   Head- normocephalic, atraumatic Eyes-  Sclera clear, conjunctiva pink Ears- hearing intact Oropharynx- clear Neck- supple, no JVP Lymph- no cervical lymphadenopathy Lungs- Clear to ausculation bilaterally, normal work of breathing Heart- slow, regular rate and rhythm, no murmurs, rubs or gallops, PMI not laterally displaced GI- soft, NT, ND, + BS Extremities- no clubbing, cyanosis,  Trace edema MS- no significant deformity or atrophy Skin- no rash  or lesion Psych- euthymic mood, full affect Neuro- strength and sensation are intact  EKG- Junctional rhythm at 46 bpm, qrs int 92 ms, qt int 486 ms Epic records reviewed. Labs reviewed  Assessment and Plan: 1. Persistent symptomatic afib Maintaining normal rhythm but may be junctional today and slow, may be contributing to symptoms   Will decrease meds as below Continue apixaban for chadsvasc score of at least 4   2.Dyspnea Decrease amiodarone 200 mg qd, 1/2 tab Decrease  metoprolol  to 25 mg, 1/2 tab a day CXR today Will consider updating echo if symptoms do not improve May also need to get a stress test to r/o ischemia if symptoms continue   3. Non Hodgkns B cell lymphoma S/p chemotherapy  4. Diastolic heart failure Continue Lasix 40 mg bid Fluid status stable  F/u here on Monday  F/u with Pulmonology as scheduled 02/07/17  Geroge Baseman. Chaim Gatley, [facility] [address] [place], [place] [address] [phone]

## 2017-01-26 NOTE — Patient Instructions (Signed)
Your physician has recommended you make the following change in your medication:  1)Decrease Amiodarone to 100mg  once a day (1/2 of your 200mg  tab) 2)Decrease metoprolol to 1/2 tablet once a day (1/2 of your 25mg  tab)

## 2017-01-29 ENCOUNTER — Ambulatory Visit (HOSPITAL_COMMUNITY)
Admission: RE | Admit: 2017-01-29 | Discharge: 2017-01-29 | Disposition: A | Discharge status: Home / Self Care | Payer: PPO | Source: Ambulatory Visit | Attending: Nurse Practitioner | Admitting: Nurse Practitioner

## 2017-01-29 VITALS — BP 136/70 | HR 53 | Ht 66.0 in | Wt 195.0 lb

## 2017-01-29 DIAGNOSIS — Z800 Family history of malignant neoplasm of digestive organs: Secondary | ICD-10-CM | POA: Insufficient documentation

## 2017-01-29 DIAGNOSIS — I130 Hypertensive heart and chronic kidney disease with heart failure and stage 1 through stage 4 chronic kidney disease, or unspecified chronic kidney disease: Secondary | ICD-10-CM | POA: Diagnosis not present

## 2017-01-29 DIAGNOSIS — C8580 Other specified types of non-Hodgkin lymphoma, unspecified site: Secondary | ICD-10-CM | POA: Diagnosis not present

## 2017-01-29 DIAGNOSIS — M7021 Olecranon bursitis, right elbow: Secondary | ICD-10-CM | POA: Diagnosis not present

## 2017-01-29 DIAGNOSIS — J449 Chronic obstructive pulmonary disease, unspecified: Secondary | ICD-10-CM | POA: Insufficient documentation

## 2017-01-29 DIAGNOSIS — I481 Persistent atrial fibrillation: Secondary | ICD-10-CM | POA: Diagnosis not present

## 2017-01-29 DIAGNOSIS — R0609 Other forms of dyspnea: Secondary | ICD-10-CM | POA: Diagnosis not present

## 2017-01-29 DIAGNOSIS — E785 Hyperlipidemia, unspecified: Secondary | ICD-10-CM | POA: Diagnosis not present

## 2017-01-29 DIAGNOSIS — Z833 Family history of diabetes mellitus: Secondary | ICD-10-CM | POA: Diagnosis not present

## 2017-01-29 DIAGNOSIS — I5032 Chronic diastolic (congestive) heart failure: Secondary | ICD-10-CM | POA: Diagnosis not present

## 2017-01-29 DIAGNOSIS — R0600 Dyspnea, unspecified: Secondary | ICD-10-CM | POA: Insufficient documentation

## 2017-01-29 DIAGNOSIS — N183 Chronic kidney disease, stage 3 (moderate): Secondary | ICD-10-CM | POA: Insufficient documentation

## 2017-01-29 DIAGNOSIS — Z7901 Long term (current) use of anticoagulants: Secondary | ICD-10-CM | POA: Insufficient documentation

## 2017-01-29 DIAGNOSIS — Z9889 Other specified postprocedural states: Secondary | ICD-10-CM | POA: Insufficient documentation

## 2017-01-29 DIAGNOSIS — Z807 Family history of other malignant neoplasms of lymphoid, hematopoietic and related tissues: Secondary | ICD-10-CM | POA: Diagnosis not present

## 2017-01-29 DIAGNOSIS — Z79891 Long term (current) use of opiate analgesic: Secondary | ICD-10-CM | POA: Diagnosis not present

## 2017-01-29 DIAGNOSIS — D509 Iron deficiency anemia, unspecified: Secondary | ICD-10-CM | POA: Insufficient documentation

## 2017-01-29 DIAGNOSIS — Z9221 Personal history of antineoplastic chemotherapy: Secondary | ICD-10-CM | POA: Diagnosis not present

## 2017-01-29 DIAGNOSIS — Z808 Family history of malignant neoplasm of other organs or systems: Secondary | ICD-10-CM | POA: Diagnosis not present

## 2017-01-29 DIAGNOSIS — Z90710 Acquired absence of both cervix and uterus: Secondary | ICD-10-CM | POA: Diagnosis not present

## 2017-01-29 DIAGNOSIS — E559 Vitamin D deficiency, unspecified: Secondary | ICD-10-CM | POA: Diagnosis not present

## 2017-01-29 DIAGNOSIS — E039 Hypothyroidism, unspecified: Secondary | ICD-10-CM | POA: Insufficient documentation

## 2017-01-29 DIAGNOSIS — Z91041 Radiographic dye allergy status: Secondary | ICD-10-CM | POA: Insufficient documentation

## 2017-01-29 DIAGNOSIS — Z79899 Other long term (current) drug therapy: Secondary | ICD-10-CM | POA: Diagnosis not present

## 2017-01-29 DIAGNOSIS — F419 Anxiety disorder, unspecified: Secondary | ICD-10-CM | POA: Diagnosis not present

## 2017-01-29 DIAGNOSIS — K449 Diaphragmatic hernia without obstruction or gangrene: Secondary | ICD-10-CM | POA: Insufficient documentation

## 2017-01-29 NOTE — Progress Notes (Signed)
Patient ID: Amanda Sellers, female   DOB: September 15, 1932, 84 y.o.   MRN: [id number]           Primary Care Physician: Tammi Sou, MD Referring Physician: Dr. Ronni Rumble Amanda Sellers is a 84 y.o. female with a h/o  diastolic heart failure, afib, newly diagnosed with recent dx of lymphoma. She was admitted to [facility] 9/22 on IV dilt and given diuretics for acute on chronic systolic HF. It was arranged for to undergo TEE DCCV.  She had exertional dyspnea with PND & orthopnea.She felt very tired & had little energy. She  noted a few palpitations, but does not recall rapid irregular heart rates/rhythms.   She had TEE without thrombus and underwent DCCV that was successful with one shock of 120J. She did well post procedure and was seen by Dr. Jerilynn Mages. Croitoru and found stable for discharge. She was negative 2700cc since admit and weight was down 4 lbs with IV diuretics. D/C wt is 168.  Plan on d/c was for  uninterrupted anticoagulation for next 30 days, preferably lifelong barring bleeding complications. AFib clinic follow up in 1-2 weeks. Outpatient [facility], non-urgent. ASA stopped.   She was seen in the afib clinic, 10/10 and found to be in afib with rvr. She went into afib short time after cardioversion. EKG showed afib at 130-140 bpm. She was  not on any drugs for rate control.  Is taking DOAC without fail. Continues with chemotherapy for lymphoma.   She was started on BB and  returned to clinic 10/12 with better v rates. 80's with sitting and 90-100's with activity. She does not have any PND/orthopnea although she has slept better in her recliner for months now.No lower extremity edema. Her weight is stable at home.  She has  finished chemotherapy for non- hodgkin's lymphoma and had f/u Pet scan 10/26,  and CA appears to be in remission, so per pt, no more chemo for right now. She has been on DOAC now x one month so will start attempts to restore SR. Discussed options  amiodarone or tikosyn and she would prefer amiodarone as she has two neighbors on amiodarone and they are both in rhythm and tolerating drug well. She is not interested in a hospital stay for drug loading. She reports rash with recent contrast dye. Amiodarone was started 200 mg bid, to then go down to 200 mg a day and then would discuss cardioversion.  She asked to be seen 11/2,  in the afib clinic for increased shortness of breath yesterday and last night. She has slept in the recliner for months but did not feel as well last night and slept poorly, feeling more short of breath and fullness in her chest.She has noticed increased pedal edema. She did not take her amiodarone last night or this am. Ekg shows rate controlled afib at 82 bpm, qtc at 464 ms.PO on RA is 98%. Weight is up 2 lbs from last visit, 6 lbs since 10/17. Lasix dose was decreased to 40 mg daily from 40 mg am and 20 mg pm when seen by Dr. Ellyn Hack 10/22.. Pt has noticed weight climbing for several days. She is fairly comfortable when quiet and sitting.   She was asked to double lasix to 80 mg  x 3 days, and to reduce amiodarone 200 mg a day. She had lost 3 pounds on return to clinic several days later. Clinically, she is much improved with more energy and less shortness of  breath. Able to sleep well. Had enough energy to walk into clinic today without wheelchair and felt well enough to clean her house yesterday. Ideally,I would like to load amiodarone a while longer but will go ahead and  pursue DCCV sooner than later, as it is contributing to her heart failure. Compliant with blood thinner without missed doses On recent labs, TSH was elevated but daughter explained that she just started taking replacement correctly on an empty stomach, and sees PCP 11/16 and she expects him to adjust the dose.  11/14-  Returns to afib clinic with a smile on her face and reporting all the activities she has been doing over the last week, having returned to  sinus rhythm after cardioversion. Much more energy and breathing is much improved.  Fluid status is stable.  12/13- Returns to afib clinic for f/u and reports doing well No afib, no further shortness of breath or edema. She is back to her usual activities. Got a good report form the Ca unit recently. Tolerating amiodarone 200 mg a day.  Return to afib clinic 4/18, reports that she is staying in [place]. Saw pulmonology yesterday for some c/o of shortness of breath but breathing much improved since staying in SR. Xray no active process, thyroid, liver studies drawn yesterday, and normal. MD said he would call hr after results of tests were reviewed. Fluid status normal.  F/u in afib clinic 01/31/16, pt continues to do well without any afib. Her fluid status is normal. Last TSH, 3 months ago, showed mild increase and will check TSH and liver panel today. Her breathing is good other than the last week in the bitter cold weather  she does not breath as good outside.No bleeding issues with eliquis. Fluid status is stable on lasix.  F/u in afib 01/26/17  for shortness of breath. Pt has been having shortness of breath for some while but contributed it to her knees and difficultly in ambulating. She was seen by pulmonology in July with improved PFT's and was OK to continue amiodarone. She was seen in November by Dr. Ellyn Hack and shortness of breath was thought to be stable/chronic. She feels that her shortness of breath has worsened since Christmas.  If she walks to the bathroom and back she is entirely out of breath. Sleeps on 2 pillows and has not felt the need to increase to elevate her head more at night. Ekg today shows junctional rhythm at 46 bpm. She is breathless speaking and started walking to clinic room but had to stop and get w/c. PO 99%.  F/u in afib clinic 1/7, after reducing amiodarone to 1/2 tab a day as well BB to 1/2 tab a day for brady as well as increase in shortness of breath. She is feeling much better  today to the point she was able to walk to clinic and not use w/c. She is not breathless talking.  Today, she denies symptoms of palpitations, chest pain,  orthopnea, PND, mild chronic lower extremity edema, no dizziness, presyncope, syncope, or neurologic sequela.  Worsening dyspnea. The patient is tolerating medications without difficulties and is otherwise without complaint today.   Past Medical History:  Diagnosis Date  . Anticoagulation adequate, Eliquis 10/16/2014  . Anxiety    clonaz helps this AND her breathing  . Atrial fibrillation ([place])    With RVR; elec cardioversion 11/30/14.  On Amio since 10/2014; Dr. Lenna Gilford following her from pulm standpoint regarding this med.  . Chronic diastolic congestive heart failure ([place])  81/19/1478   Complicated by atrial fibrillation; Echo 07/23/14: mild LVH, EF 50-55%, grade 2 diast dysfxn.  NYHA class II.  Marland Kitchen Chronic renal insufficiency, stage III (moderate) (HCC)    GFR @ 50  . COPD (chronic obstructive pulmonary disease) ([place])    recently noted on Xray, does not have any problems  . Family history of adverse reaction to anesthesia    daughter has difficulty waking up  . Follicular lymphoma ([place])    Non Hodgkins B cell lymphoma; s/p chemo summer 2016--in remission as of 03/2016.  Marland Kitchen HLD (hyperlipidemia)     06/2014  . HTN (hypertension)   . Hypothyroidism   . Lip cancer    Dr. Janace Hoard excised this: invasive SCC--no sign of cancer at ENT f/u 10/2015  . Microcytic anemia    transfused 3 U total in hosp 07/2014  . Olecranon bursitis of right elbow 02/2015  . Pancytopenia due to antineoplastic chemotherapy ([place])   . Positive occult stool blood test 08/08/14   Endoscopies ok 08/2014  . Primary osteoarthritis of both knees 04/2016   Dr. Gladstone Lighter: bone on bone--to get euflexxa to both knees.  . Pulmonary metastases ([place]) 07/24/2014  . Shortness of breath dyspnea    with exertion.  Dr. Lenna Gilford started her on clonazepam to try to relax her chest wall and allow  more satisfying deep breath. (03/2016).  . Vitamin B 12 deficiency   . Vitamin D deficiency    Past Surgical History:  Procedure Laterality Date  . ABDOMINAL HYSTERECTOMY  1978  . BONE MARROW BIOPSY  07/24/14  . BREAST BIOPSY    . CARDIOVERSION N/A 10/16/2014   Procedure: CARDIOVERSION;  Surgeon: Lelon Perla, MD;  Location: [facility] ENDOSCOPY;  Service: Cardiovascular;  Laterality: N/A;  . CARDIOVERSION N/A 11/30/2014   Procedure: CARDIOVERSION;  Surgeon: Larey Dresser, MD;  Location: [facility] ENDOSCOPY;  Service: Cardiovascular;  Laterality: N/A;  . COLONOSCOPY  09/23/14   small hemorrhoids, otherwise normal (performed for IDA and heme+ stool)  . COLONOSCOPY    . MASS EXCISION Left 07/15/2015   Left lower lip mass--invasive SCC w/negative margins.  Procedure: EXCISION MASS;  Surgeon: Melissa Montane, MD;  Location: [place];  Service: ENT;  Laterality: Left;  Wedge excision left lower lip mass  . NM MYOVIEW LTD  10/29/2014   medium size mild surgery defect in the mid anterior and apical anterior location suggestive of breast attenuation. No reversibility. LOW RISK  . PFTs  02/2015   Restriction with diffusion defect: cardiologist referred her to pulm to help interpret PFTs and decide whether she has amiodarone toxicity  . TEE WITHOUT CARDIOVERSION N/A 10/16/2014   Procedure: TRANSESOPHAGEAL ECHOCARDIOGRAM (TEE);  Surgeon: Lelon Perla, MD;  Location: [facility] ENDOSCOPY;  Service: Cardiovascular;  Laterality: N/A;  . TRANSTHORACIC ECHOCARDIOGRAM  07/23/14   mild LVH, EF 50-55%, grade 2 diast dysfxn  . UPPER GI ENDOSCOPY  09/23/2014   small hiatus hernia, nodules in stomach biopsied (chronic active erosive atrophic gastritis with intestinal metaplasia--no dysplasia or malignancy) otherwise normal    Current Outpatient Medications  Medication Sig Dispense Refill  . ALPRAZolam (XANAX) 0.5 MG tablet 1 tab po qhs prn anxiety-related insomnia 90 tablet 1  . amiodarone (PACERONE) 200 MG tablet Take 0.5 tablets (100  mg total) by mouth daily. 90 tablet 3  . apixaban (ELIQUIS) 5 MG TABS tablet Take 1 tablet (5 mg total) by mouth 2 (two) times daily. 20 tablet 5  . clonazePAM (KLONOPIN) 0.5 MG tablet Take 1/2  to 1 tablet by mouth two times daily as needed for shortness of breath 60 tablet 5  . cyanocobalamin (,VITAMIN B-12,) 1000 MCG/ML injection Inject 1 mL (1,000 mcg total) into the skin every 30 (thirty) days. 1 mL 12  . ferrous sulfate 325 (65 FE) MG tablet Take 1 tablet (325 mg total) by mouth 2 (two) times daily with a meal. 30 tablet 1  . fish oil-omega-3 fatty acids 1000 MG capsule Take 2 g by mouth daily.    . furosemide (LASIX) 40 MG tablet TAKE ONE TABLET BY MOUTH TWICE DAILY 60 tablet 10  . levothyroxine (SYNTHROID, LEVOTHROID) 100 MCG tablet TAKE ONE TABLET BY MOUTH DAILY BEFORE BREAKFAST 90 tablet 1  . metoprolol succinate (TOPROL-XL) 25 MG 24 hr tablet Take 0.5 tablets (12.5 mg total) by mouth daily. 180 tablet 3  . OXYGEN Inhale into the lungs. 1Lt at bedtime    . potassium chloride 20 MEQ TBCR Take 20 mEq by mouth daily. 30 tablet 3  . pravastatin (PRAVACHOL) 80 MG tablet TAKE ONE TABLET BY MOUTH DAILY 90 tablet 3  . prochlorperazine (COMPAZINE) 10 MG tablet Take 10 mg by mouth every 8 (eight) hours as needed for nausea or vomiting. Reported on 08/05/2015    . traMADol (ULTRAM) 50 MG tablet Take 1 tablet (50 mg total) by mouth 2 (two) times daily. For arthritis pain (Patient not taking: Reported on 01/26/2017) 60 tablet 5  . Vitamin D, Ergocalciferol, (DRISDOL) 50000 units CAPS capsule TAKE 1 CAPSULE BY MOUTH EVERY SUNDAY (Patient not taking: Reported on 01/26/2017) 12 capsule 1   No current facility-administered medications for this encounter.     Allergies  Allergen Reactions  . Iodinated Diagnostic Agents Rash and Other (See Comments)    CT Contrast    Social History   Socioeconomic History  . Marital status: Married    Spouse name: Not on file  . Number of children: 8  . Years of  education: Not on file  . Highest education level: Not on file  Social Needs  . Financial resource strain: Not on file  . Food insecurity - worry: Not on file  . Food insecurity - inability: Not on file  . Transportation needs - medical: Not on file  . Transportation needs - non-medical: Not on file  Occupational History  . Occupation: retired  Tobacco Use  . Smoking status: Never Smoker  . Smokeless tobacco: Never Used  Substance and Sexual Activity  . Alcohol use: No    Alcohol/week: 0.0 oz  . Drug use: No  . Sexual activity: Not on file  Other Topics Concern  . Not on file  Social History Narrative   Widowed, has 8 children.   Orig from [place], now lives in [place].   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   No tob/alc/drugs.    Family History  Problem Relation Age of Onset  . Melanoma Mother   . Stomach cancer Father   . Lymphoma Sister   . Diabetes Daughter     ROS- All systems are reviewed and negative except as per the HPI above  Physical Exam: Vitals:   01/29/17 1012  BP: 136/70  Pulse: (!) 53  SpO2: 98%  Weight: 195 lb (88.5 kg)  Height: '5\' 6"'$  (1.676 m)    GEN- The patient is well appearing, alert and oriented x 3 today.   Head- normocephalic, atraumatic Eyes-  Sclera clear, conjunctiva pink Ears- hearing  intact Oropharynx- clear Neck- supple, no JVP Lymph- no cervical lymphadenopathy Lungs- Clear to ausculation bilaterally, normal work of breathing Heart-   regular rate and rhythm, no murmurs, rubs or gallops, PMI not laterally displaced GI- soft, NT, ND, + BS Extremities- no clubbing, cyanosis,  Trace edema MS- no significant deformity or atrophy Skin- no rash or lesion Psych- euthymic mood, full affect Neuro- strength and sensation are intact  EKG- Sinus brady at at 53 bpm, pr int 226 ms, qrs int 94 ms, qtc 499 ms Epic records reviewed. Labs reviewed CXR-FINDINGS:01/26/17 Stable cardiomediastinal  silhouette. No pneumothorax or pleural effusion is noted. No acute pulmonary disease is noted. Stable moderate size hiatal hernia. Stable old lower thoracic compression fracture.  IMPRESSION: No active cardiopulmonary disease. Stable moderate size hiatal hernia.   Assessment and Plan: 1. Persistent symptomatic afib  HR improved in the 50's with less BB/amio Continue 100 mg amiodarone day as well as 12.5 mg metoprolol ER daily Continue apixaban for chadsvasc score of at least 4   2.Dyspnea Improved with less amio/BB   3. Non Hodgkns B cell lymphoma S/p chemotherapy  4. Diastolic heart failure Continue Lasix 40 mg bid Fluid status stable  F/u here in 3 weeks F/u with Pulmonology as scheduled 02/07/17  Geroge Baseman. Corbet Hanley, [facility] [address] [place], [place] [address] [phone]

## 2017-02-05 ENCOUNTER — Encounter: Payer: Self-pay | Admitting: Family Medicine

## 2017-02-07 ENCOUNTER — Encounter: Payer: Self-pay | Admitting: Pulmonary Disease

## 2017-02-07 ENCOUNTER — Ambulatory Visit: Payer: PPO | Admitting: Pulmonary Disease

## 2017-02-07 VITALS — BP 120/70 | HR 61 | Temp 97.3°F | Ht 66.0 in | Wt 192.8 lb

## 2017-02-07 DIAGNOSIS — Z79899 Other long term (current) drug therapy: Secondary | ICD-10-CM | POA: Diagnosis not present

## 2017-02-07 DIAGNOSIS — C8200 Follicular lymphoma grade I, unspecified site: Secondary | ICD-10-CM

## 2017-02-07 DIAGNOSIS — D6489 Other specified anemias: Secondary | ICD-10-CM

## 2017-02-07 DIAGNOSIS — I480 Paroxysmal atrial fibrillation: Secondary | ICD-10-CM | POA: Diagnosis not present

## 2017-02-07 DIAGNOSIS — R0600 Dyspnea, unspecified: Secondary | ICD-10-CM

## 2017-02-07 DIAGNOSIS — R942 Abnormal results of pulmonary function studies: Secondary | ICD-10-CM

## 2017-02-07 DIAGNOSIS — I10 Essential (primary) hypertension: Secondary | ICD-10-CM | POA: Diagnosis not present

## 2017-02-07 DIAGNOSIS — Z7901 Long term (current) use of anticoagulants: Secondary | ICD-10-CM | POA: Diagnosis not present

## 2017-02-07 DIAGNOSIS — I5032 Chronic diastolic (congestive) heart failure: Secondary | ICD-10-CM | POA: Diagnosis not present

## 2017-02-07 MED ORDER — CLONAZEPAM 0.5 MG PO TABS: ORAL_TABLET | ORAL | 5 refills | Status: DC

## 2017-02-07 MED ORDER — TRAMADOL HCL 50 MG PO TABS: 50.0000 mg | ORAL_TABLET | Freq: Two times a day (BID) | ORAL | 5 refills | Status: DC

## 2017-02-07 NOTE — Progress Notes (Signed)
Subjective:     Patient ID: Amanda Sellers, female   DOB: 01/10/1933, 84 y.o.   MRN: [id number]  HPI    84 y/o WF, referred by DrHarding due to an abnormal PFT revealing a low DLCO, pt is on amiodarone for AFib...   ~  May 10, 2015:  Initial Pulmonary consultation by SN>  She is here today w/ her daughter Amanda Sellers, an RN from the ICU at [facility];  Her PCP is DrPMcGowen in [place]...    84 y/o WF referred by DrHarding for a decr DLCO on Amiodarone for AFib... She notes DOE w/ exertion but states that it's getting better w/ a walking regimen & feels that she is getting stronger;  She has a hx of a follicular lymphoma and had chemoRx from DrSherrill (his note of 03/29/15 is reviewed)- diagnosed 06/2014 & treated w/ 4 cycles of bendamustine/ rituximab w/ f/u CT showing signif improvement in adenopathy, lung nodules and renal lesions;  NOTE> she was placed on O2 for nocturnal use by DrSherrill during her ChemoRx, uses it prn only but doesn't want to give it up!... She saw DrHarding last 01/2015 for f/u AFib & CHF- on Amiodarone since 11/2014;  NOTE> she states her breathing is much better since her successful 2nd DCCV=> NSR...  2DEcho 07/22/14:  Mild LVH, borderline LVF w/ EF=50-55%, no regional wall motion abn, Gr2DD, trivAI, calcif mitral annulus, mod LAdil at 46mm, PAsys=31mmHg  Myoview 10/29/2014:  There is a medium defect of mild severity present in the mid anterior and apical anterior location. This is a low risk study. Breast attenuation artifact Study would not gate secondary to afib and PVCs.  TEE 10/16/2014: Normal LV function (EF 55-60%); biatrial enlargment; spontaneous contrast in LAA but no thrombus; mild AI; moderate MR (2+); mild TR.  Smoking Hx>  She is a never smoker.  Pulmonary Hx>  No prev hx of lung dis- denies hx asthma, no prev bouts of bronchitis, never had pneumonia, no hx TB or known exposure.    Cardiac Hx>  Adm 09/2014 w/ AFlutter & rvr, s/p cardioversion x2 (last 11/2014); HBP,  Chronic diastolic CHF, Valvular dis w/ modMR & mild AI  Medical Hx>  Hx non-Hodgkins Lymphoma 06/2014 treated w/ bendamustine/ rituximab by DrSherrill; Hx microcytic anemia & scant Fe stores c/w IDA, hx of B12 defic- prev on shots, Hypothyroid, Anxiety  Family Hx>  FamHx is NEG for any lung diseases...  Occup Hx>  Retired former textile supervisor- no raw cotton dust exposure 7 no known exposure to asbestos, silica dust, etc...  Current Meds>  Last ChemoRx 10/2015; Amio200, Eliquis5Bid, ToprolXL25Bid, Lasix40AM&20PM, K20, Prav80, Synthroid100, Xanax0.5, Fe-Bid, VitD,   EXAM shows Afeb, VSS, O2sat=97% on RA at rest;  HEENT- neg, mallampati2;  Chest- clear w/o w/r/r;  Heart- RR Gr1/6 SEM w/o r/g;  Abd- soft, nontender, neg;  Ext- neg w/o c/c/e;  Neuro- intact...   Last CT Chest 11/17/14>  Improved thor adenopathy, modHH, norm heart size, diffuse atherosclerosis, R>L pleural effusions, diminished bilat pulm nodules (largest 5mm  In lingula), mild subpleural reticulation & interstitial prom  Last CXR 11/25/14>  Cardiomegaly & pul vasc congestion, atherosclerotic calcif in Ao, peribronch thickening & flattened diaph w/ bibasilar atx, osteopenia and T12 compression (chronic)  PFTs 02/26/15>  FVC=2.12 (74%), FEV1=1.70 (80%), %1sec=80, mid-flows wnl at 91% predicted; TLC=5.41 (101%), RV=3.42 (136%), RV/TLC=63%; DLCO=56% predicted (DL/VA=82% predicted)  CXR today 05/10/15>  Borderline heart size, some hyperinflation noted, clear lungs, NAD...   LABS 05/10/15>  Chems-   ok x BS=110, Cr=1.13;  CBC- Hg=11.0, MCV=84;  Fe=52 (12%sat), Ferritin=37;  TSH=3.30, FreeT4=1.31;  B12=210;  Folate>23 IMP >>     Abnormal PFT w/ norm airflow, norm lung volumes, air trapping & decr DLCO=56%>  This is a baseline PFT study done 02/2015 & Amio was started 10/2014; DLCO also affected by lung vol & anemia...    CT Chest 10/2014 had some CHF w/ interstitial prominence, sm bilat effusions, but improved adenopathy & diminished pulm  nodules s/p chemotherapy for lymphoma    HBP> on MetopER25Bid, Lasix40AM&20PM, K20    Hx PAF> on Eliquis5Bid, Amiodarone200; she is holding NSR on these meds...    Chr diastolic CHF> on meds above; last BNP was 11/2014= 378...    Hyperlipidemia> on Prav80 + Fish Oil    Hypothyroid> on Synthroid100    Osteopenia and T12 compression    Hx non-hodgkins lymphoma> dx 6/16 & treated w/ 4 cycles chemoRx (last 10/28/14)    Anemia> on FeBid since 6/16 & followed by DrSherrill; last CBC 03/11/15 showed Hg=10.6, MCV=81; will discuss w/ DrSherrill re: poss IV iron therapy & she definitely needs to get back on her B12 supplementation.    Hx B12 defic> she is not currently taking B12 shots or supplements;  Vit B12 blood level 04/2015 off supplement for ?39yr 210 PLAN >>     SAadyaindicates that her DOE is improving, getting better "It helps me to walk" she notes;  We are hindered by the timing of her studies- starting on Amio 10/2014, last CTChest 10/2014, 1st PFT 02/2015, her chronic diastolic CHF, mild anemia, etc... She will continue same meds, continue to exercise, f/u w/ Oncology & we will recheck pt in 6 weeks...   ~  June 29, 2015:  6wk ROV w/ SN>  Amanda Sellers that her dyspnea is much improved overall- she denies CP, cough, sput, palpit, dizziness, edema, etc;  She says she developed a fever blister on her lower lip ~3wks ago, "couldn't get an appt w/ Derm until end of the month";  Now w/ large round lesion left side lower lip => refer to ENT NOW...   Prob list as noted above- on O2 "prn" per DrSherrill (she uses it occas at night), Eliquis5Bid, Amio200, Metop25Bid, Lasix40AM&20PM, K20, Prav80, Fe-Bid, VitD50K/wk, Synthroid100, Alpraz0.5prn... Her B12 level was 210 04/2015 & she is asked to start VitB12 shots Qmo again (daughter will give these to her at home)...    She had f/u AFib clinic 44/97/02 AFib, diastolicCHF; she had TEE & DCCV 09/2014, then recurrent AFib & again cardioverted 11/2014, maintaining  NSR since then- their extensive note is reviewed & she is much improved at present...    She saw DrSherrill 36/3/78>f/u on her follicular lymphoma, anemia (Fe de427f & hx B12 defic); felt to be in clinical remission & he continues to follow Q4m77mo    She saw DrMcGowen 06/25/15- note reviewed... IMP/PLAN>>   She is rec to continue current meds; restart B12 shots monthly per her daughter who is an RN,Therapist, sportsefer to ENT for Bx/ excision of left lower lip lesion... We plan ROV recheck in 8mo40mo ~  October 04, 2015:  8mo 91mow/ SN>  Amanda Sellers and notes some SOB in hot weather & w/ exertion; stress also contributes as her sister passed away recently; she remains too sedentary & not getting regular exercise... 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    CT Chest 10/2014 had some CHF w/ interstitial prominence, sm bilat effusions, but improved adenopathy & diminished pulm nodules s/p chemotherapy for lymphoma    HBP> on MetopER25Bid, Lasix40Bid, K20;  BP= 124/70 7 she denies CP, palpit, dizzy, edema...    Hx PAF> on Eliquis5Bid, Amiodarone200; she is holding NSR on these meds & followed by DrHarding- seen 08/09/15 w/ HBP, mild diastolicCHF & PAF; no change in meds...    Chr diastolic CHF> on meds above; last BNP was 11/2014= 378...    Hyperlipidemia> on Prav80 + Fish Oil; last FLP was 11/16 showing TChol 150, TG 135, HDL 49, LDL 74    Hypothyroid> on Synthroid100; last TSH was 4/17 showing TSH= 3.30    Osteopenia and T12 compression> Imaging reveals stable chr T12 compression fx; ?last BMD 2008 w/ lowest Tscore -2.3 in left FemNeck on WomensMVI, VitD 50K/wk, wt bearing exercise but no bone building  meds...    Hx non-hodgkins lymphoma> dx 6/16 & treated w/ 4 cycles chemoRx (last 10/28/14)- Imaging 10/16 w/ signif decr adenopathy in chest/ abd/ pelvis, resolution of splenomegaly, decr in pulm & renal nodularity, incr bilat pleural effusions & basilar atx; she last saw DrSherrill 08/05/15- good appetite & energy level, he felt she remained in remission & planed f/u 4mo    Anemia> on FeBid since 6/16 & followed by DrSherrill; last CBC 7/17 showed Hg=10.9, MCV=87...    Hx B12 defic> Vit B12 blood level 04/2015 off supplement for ?1yr= 210; we restarted VitB12 shots=> Vit B12 level 9/17 = 484 EXAM shows Afeb, VSS, O2sat=98% on RA at rest;  HEENT- neg, mallampati2, NEW- large lesion lower lip;  Chest- clear w/o w/r/r;  Heart- RR Gr1/6 SEM w/o r/g;  Abd- soft, nontender, neg;  Ext- VI w/o c/c/e;  Neuro- intact...  IMP/PLAN>>  Note- pt has O2 per DrSherrill & states "I use it at night when I need it for SOB"; OK 2017 Flu vaccine today; we reviewed low carb diet & incr exercise program... we plan rov recheck 6mo...  ~  April 10, 2016:  6mo ROV & Amanda Sellers is c/o breathing worse & notes SOB w/ ADLs x 2-3 wks now she says but she has no idea as to why? She denies cough, sput, hemoptysis; denies CP, palpit, dizzy, edema; just c/o SOB w/ activity & when probed further for more information she notes SOB w/ ADLs but she continues to work-out on her exercise bike 2x per day w/o difficulty & NOT SOB on this; she says the SOB w/ ADLs is a sensation of not being able to get a deep breath "IN" but she does not note any wheezing etc; she is not aware of any incr stress, she has Xanax but not taking... From the pulmonary standpoint she is a never smoker, had an isolated decr in DLCO on PFTs (on Amio), started on nocturnal O2 by DrSherrill while she was on chemotherapy for her Lymphoma w/ pulm nodules on CT (these diminished after Chemo as per her last CT Chest 10/2014), and is under treatment for her DiastolicCHF & doing satis on  Lasix40Bid... She has maintained regular f/u w/ her specialist>>    She had f/u visit w/ ENT-DrByers 11/19/15> s/p excision of squamous   cell ca left lower lip 07/15/15 & doing well...    She saw DrSherrill 11/26/15> f/u Non-hodgkins Lymphoma- it's been one year since last scans but there was no mention of re-checking CT Chest/ Abd/ Pelvis, they plan ROV in 64mo..    She was seen in the AF Clinic 16/7/61> DiastolicCHF & PAFib- s/p DCCV 9/17, placed on Eliquis, subseq return of AFib & had to start Amio200, Metoprolol=> now maintaining NSR & dyspnea improved after diuresis...    She saw her PCP- DrMcGowen 02/07/16> f/u HBP(controlled on Metoprolol, Lasix, KCl), HL (at goals on Prav80), Hypothy (stable on Synthroid100), CKD (stable w/ Cr=1.14)... EXAM shows Afeb, VSS, O2sat=100% on RA at rest;  HEENT- neg, mallampati2, lip lesion resolved;  Chest- clear w/o w/r/r;  Heart- RR Gr1/6 SEM w/o r/g;  Abd- soft, nontender, neg;  Ext- VI w/o c/c/e;  Neuro- intact...    CXR 04/10/16>  Mild cardiomeg, clear lungs w/o edema/ infiltrate/ or pleural effusions- NAD, modHH, mild degen changes in Tspine w/ compression of T12...  Ambulatory Oximetry 04/10/16>  O2sat=97% on RA at rest w/ pulse=56/min;  She walked just 2 Laps (185' ea) w/ lowest O2sat=97% w/ pulse up to 95/min... IMP/PLAN>>  We discussed how this dyspnea is diff than her prev SOB w/ AFib & CHF- esp how she feels SOB w/ ADLs (can't get a deep breath "IN") but able to work-out on her exercise bike Bid w/o difficulty;  In this regard I have rec a trial of KLONOPIN 0.'5mg'$  1/2 to 1 tab po Bid to relax her chest wall muscles and allow a deeper more satisfying breath; we plan rov recheck to document response in 1651mo.  ~  May 08, 2016:  25m54moV & last visit we recommended Klonopin 0.'5mg'$  Bid for her dyspnea & she reports much improved!  Now her CC is knee pain & she ambulates w/ walker- [facility] pain med and [facility]; we wrote for  TRAMADOL50 & will set her up w/ Ortho per request...     She had f/u DrSherrill 04/21/16>  Hx non-hodgkin's lymphoma, anemia, hx B12 defic, hx SCCa left lower lip excised 2017;  She remains in remission & is rec to continue on Iron therapy...  EXAM shows Afeb, VSS, O2sat=95% on RA at rest;  HEENT- neg, mallampati2, lip lesion resolved;  Chest- clear w/o w/r/r;  Heart- RR Gr1/6 SEM w/o r/g;  Abd- soft, nontender, neg;  Ext- VI w/o c/c/e;  Neuro- intact...   IMP/PLAN>>  We wrote for Tramadol & will refer her to Ortho to see if a shot would be helpful, discuss options;  We plan rov recheck in ~51mo45mo ~  August 07, 2016:  51mo 48mo& Amanda Sellers reports breathing well- still taking the Klonopin 0.'5mg'$  Bid for the dyspnea;  Her CC remains knee pain & she has seen Ortho- DrGioffre for cortisone shots w/ temp improvement; they also rec 5 lb ankle weights to strengthen her Quads, and they received insurance approval for "gel shots" (Euflexxa), uses brace and walker...  She has Home O2 and uses this just as needed- day or night...    She saw CARDS-DrHarding 4/23/9/50/93>b & Diastolic CHF;  On Amio, Eliquis, Metoprolol & Lasix;  Doing well x for knee pain, he notes that Klonopin rx is really helping her dypnea/ sleep/ etc; they are reluctant to decr the amio dose as she does not tol the AFib very well...    She saw PCP-DrMcGowen 07/03/16>  Medicare health maintenance exam, note reviewed- HBP, HL, hypothy, renal insuffic, DJD;  Family noted short term memory problems (MCI + tremor) but this appears minor, Labs ok x Hg=10.6, mcv=80 & followed by DrSherrill on oral iron supplement...  EXAM shows Afeb, VSS, O2sat=95% on RA at rest;  HEENT- neg, mallampati2, lip lesion resolved;  Chest- clear w/o w/r/r;  Heart- RR Gr1/6 SEM w/o r/g;  Abd- soft, nontender, neg;  Ext- VI w/o c/c/e;  Neuro- intact...    Labs 6/18>  Chems- ok w/ BS=121, Cr=0.93;  CBC- Hg=11.1, mcv=81, Fe=50 (14%sat) on oral Fe supplement;  TSH=4.17...  FullPFTs  08/08/16>  FVC=1.75 (63%), FEV1=1.36 (65%), %1sec=77%, mid-flows wnl at 84% predicted;  TLC=3.82 (71%), RV=1.91 (75%), RV/TLC=50%;  DLCO=60% predicted & DL/VA is 94% predicted... NOTE- DLCO is stable from 02/2015, no change...  IMP/PLAN>>  Amanda Sellers is stable, both clinically & by PFTs, rec to contiunue same meds and follow up 66mo..   ~  February 07, 2017:  642moOV & Amanda Sellers reports that her breathing is "great" & credits the improvement to a decrease in her Amio & Metoprolol doses (she was c/o incr SOB & DrHarding decreased the Amio/ BBlocker and she's improved)...  We reviewed the following interval medical notes in Epic>      She saw ENT- DrByers on 08/30/16> s/p excision of squamous cell ca left lower lip 07/15/15; eval & Bx of a left alveolar ridge ulceration & mass- results revealed benign tissue per Care Everywhere lab section...    She saw ONCOLOGY- LThomas,NP on 10/09/16>  Hx non-hodgkins lymphoma dx 2016 & treated w/ bendamustine/rituxan x 4 cycles by DrSherrill  & she remains in remission...    She saw PCP- DrMcGowen on 11/06/16>  Hx HBP, Afib, CHF, HL, RI, non-hodgkins lymphoma; note reviewed & she was felt to be stable, NO CHANGES MADE...    She saw CARDS- DCDyann Kiefn 01/29/17>  Hx Sist+DiastCHF, AFib- her excellent summary note is reviewed; on Eliquis5Bid, Amio100, MetopER25-1/2, Lasix40Bid, K20/d;  Stable- no further changes made... We reviewed the following medical problems during today's office visit>      S/P resection of invasive squamous cell ca on left lower lip by DrByers6/2017, & bx of benign left alveolar ridge mass on 8/18...    Abnormal PFT w/ norm airflow, norm lung volumes, air trapping & decr DLCO=56%>  This is a baseline PFT study done 02/2015 & Amio was started 10/2014; DLCO also affected by lung vol & anemia...    CT Chest 10/2014 had some CHF w/ interstitial prominence, sm bilat effusions, but improved adenopathy & diminished pulm nodules s/p chemotherapy for lymphoma    HBP>  on MetopER25/-1/2 daily, Lasix40Bid, K20;  BP= 120/70 & she denies CP, palpit, dizzy, edema...    Hx PAF> on Eliquis5Bid, Amiodarone200-1/2; she is holding NSR on these meds & followed by DrHarding- seen 1/19 w/ HBP, mild diastolicCHF & PAF; no change in meds...    Chr diastolic CHF> on meds above; last BNP was 11/2014= 378...    Hyperlipidemia> on Prav80 + Fish Oil; last FLP was 6/18 showing TChol 199, TG 176, HDL 64, LDL 100    Hypothyroid> on Synthroid100; last TSH was 6/18 showing TSH= 4.17    Osteopenia and T12 compression> Imaging reveals stable chr T12 compression fx; ?last BMD 2008 w/ lowest Tscore -2.3 in left FemNeck on WomensMVI, VitD 50K/wk, wt bearing exercise but no bone building meds...    Hx non-hodgkins lymphoma> dx 6/16 & treated w/ 4 cycles chemoRx (  last 10/28/14)- Imaging 10/16 w/ signif decr adenopathy in chest/ abd/ pelvis, resolution of splenomegaly, decr in pulm & renal nodularity, incr bilat pleural effusions & basilar atx; she last saw DrSherrill 08/05/15- good appetite & energy level, he felt she remained in remission & they continue to follow    Anemia> on FeBid since 6/16 & followed by DrSherrill; last CBC 9/18 showed Hg=10.6, MCV=82...    Hx B12 defic> Vit B12 blood level 04/2015 off supplement for ?22yr 210; we restarted VitB12 shots=> Vit B12 level 9/17 = 484 EXAM shows Afeb, VSS, O2sat=99% on RA at rest;  HEENT- neg, mallampati2, lip lesion resolved;  Chest- clear w/o w/r/r;  Heart- RR Gr1/6 SEM w/o r/g;  Abd- soft, nontender, neg;  Ext- VI w/o c/c/e;  Neuro- intact.  CXR 01/26/17 showed stable heart size, sl incr lung markings- NAD, mod HH & lower Tspine compression fx... IMP/PLAN>>  SRheyais stable overall- she has not required breathing meds other than KLONOPIN 0.'5mg'$  - 1/2 tab bid for anxiety & this helps her breathing; requests TRAMADOL for leg discomfort...   Note:  >50% of this 228m rov was spent in counseling & coordination of care...    Past Medical History:   Diagnosis Date  . Anticoagulation adequate, Eliquis 10/16/2014  . Anxiety    clonaz helps this AND her breathing  . Chronic diastolic congestive heart failure ([id number]/[id number] Complicated by atrial fibrillation; Echo 07/23/14: mild LVH, EF 50-55%, grade 2 diast dysfxn.  NYHA class II.  . Marland Kitchenhronic renal insufficiency, stage III (moderate) (HCC)    GFR @ 50  . COPD (chronic obstructive pulmonary disease) ([facility]CD[facility]   recently noted on Xray, does not have any problems  . Family history of adverse reaction to anesthesia    daughter has difficulty waking up  . Follicular lymphoma ([facility]   Non Hodgkins B cell lymphoma; s/p chemo summer 2016--in remission as of 03/2016.  . Marland KitchenLD (hyperlipidemia)     06/2014  . HTN (hypertension)   . Hypothyroidism   . Lip cancer    Dr. ByJanace Hoardxcised this: invasive SCC--no sign of cancer at ENT f/u 10/2015  . Microcytic anemia    transfused 3 U total in hosp 07/2014  . Olecranon bursitis of right elbow 02/2015  . Pancytopenia due to antineoplastic chemotherapy (HCLake Wyl[place]  . Persistent atrial fibrillation (HCC)    With RVR; elec cardioversion 11/30/14.  On Amio since 10/2014; Dr. NaLenna Gilfordollowing her from pulm standpoint regarding this med.  . Marland Kitchenositive occult stool blood test 08/08/14   Endoscopies ok 08/2014  . Primary osteoarthritis of both knees 04/2016   Dr. GiGladstone Lighterbone on bone--to get euflexxa to both knees.  . Pulmonary metastases (HC[place]orada[place]7/01/2014  . Shortness of breath dyspnea    with exertion.  Dr. NaLenna Gilfordtarted her on clonazepam to try to relax her chest wall and allow more satisfying deep breath. (03/2016).  . Vitamin B 12 deficiency   . Vitamin D deficiency     Past Surgical History:  Procedure Laterality Date  . ABDOMINAL HYSTERECTOMY  1978  . BONE MARROW BIOPSY  07/24/14  . BREAST BIOPSY    . CARDIOVERSION N/A 10/16/2014   Procedure: CARDIOVERSION;  Surgeon: BrLelon PerlaMD;  [facility];  Service: Cardiovascular;  Laterality: N/A;  .  CARDIOVERSION N/A 11/30/2014   Procedure: CARDIOVERSION;  Surgeon: DaLarey DresserMD;  Location: [facility] Service: Cardiovascular;  Laterality: N/A;  . COLONOSCOPY  09/23/14  small hemorrhoids, otherwise normal (performed for IDA and heme+ stool)  . COLONOSCOPY    . MASS EXCISION Left 07/15/2015   Left lower lip mass--invasive SCC w/negative margins.  Procedure: EXCISION MASS;  Surgeon: Melissa Montane, MD;  Location: [place];  Service: ENT;  Laterality: Left;  Wedge excision left lower lip mass  . NM MYOVIEW LTD  10/29/2014   medium size mild surgery defect in the mid anterior and apical anterior location suggestive of breast attenuation. No reversibility. LOW RISK  . PFTs  02/2015   Restriction with diffusion defect: cardiologist referred her to pulm to help interpret PFTs and decide whether she has amiodarone toxicity  . TEE WITHOUT CARDIOVERSION N/A 10/16/2014   Procedure: TRANSESOPHAGEAL ECHOCARDIOGRAM (TEE);  Surgeon: Lelon Perla, MD;  [facility];  Service: Cardiovascular;  Laterality: N/A;  . TRANSTHORACIC ECHOCARDIOGRAM  07/23/14   mild LVH, EF 50-55%, grade 2 diast dysfxn  . UPPER GI ENDOSCOPY  09/23/2014   small hiatus hernia, nodules in stomach biopsied (chronic active erosive atrophic gastritis with intestinal metaplasia--no dysplasia or malignancy) otherwise normal    Outpatient Encounter Medications as of 02/07/2017  Medication Sig  . ALPRAZolam (XANAX) 0.5 MG tablet 1 tab po qhs prn anxiety-related insomnia  . amiodarone (PACERONE) 200 MG tablet Take 0.5 tablets (100 mg total) by mouth daily.  Marland Kitchen apixaban (ELIQUIS) 5 MG TABS tablet Take 1 tablet (5 mg total) by mouth 2 (two) times daily.  . clonazePAM (KLONOPIN) 0.5 MG tablet Take 1/2 tablet by mouth two times daily as needed for shortness of breath  . cyanocobalamin (,VITAMIN B-12,) 1000 MCG/ML injection Inject 1 mL (1,000 mcg total) into the skin every 30 (thirty) days.  . ferrous sulfate 325 (65 FE) MG tablet  Take 1 tablet (325 mg total) by mouth 2 (two) times daily with a meal.  . fish oil-omega-3 fatty acids 1000 MG capsule Take 2 g by mouth daily.  . furosemide (LASIX) 40 MG tablet TAKE ONE TABLET BY MOUTH TWICE DAILY  . levothyroxine (SYNTHROID, LEVOTHROID) 100 MCG tablet TAKE ONE TABLET BY MOUTH DAILY BEFORE BREAKFAST  . metoprolol succinate (TOPROL-XL) 25 MG 24 hr tablet Take 0.5 tablets (12.5 mg total) by mouth daily.  . OXYGEN Inhale into the lungs. 1Lt at bedtime  . potassium chloride 20 MEQ TBCR Take 20 mEq by mouth daily.  . pravastatin (PRAVACHOL) 80 MG tablet TAKE ONE TABLET BY MOUTH DAILY  . traMADol (ULTRAM) 50 MG tablet Take 1 tablet (50 mg total) by mouth 2 (two) times daily. For arthritis pain  . Vitamin D, Ergocalciferol, (DRISDOL) 50000 units CAPS capsule TAKE 1 CAPSULE BY MOUTH EVERY SUNDAY  . [DISCONTINUED] clonazePAM (KLONOPIN) 0.5 MG tablet Take 1/2 to 1 tablet by mouth two times daily as needed for shortness of breath  . [DISCONTINUED] traMADol (ULTRAM) 50 MG tablet Take 1 tablet (50 mg total) by mouth 2 (two) times daily. For arthritis pain  . [DISCONTINUED] prochlorperazine (COMPAZINE) 10 MG tablet Take 10 mg by mouth every 8 (eight) hours as needed for nausea or vomiting. Reported on 08/05/2015   No facility-administered encounter medications on file as of 02/07/2017.     Allergies  Allergen Reactions  . Iodinated Diagnostic Agents Rash and Other (See Comments)    CT Contrast    Immunization History  Administered Date(s) Administered  . Influenza, High Dose Seasonal PF 11/06/2016  . Influenza,inj,Quad PF,6+ Mos 10/27/2014, 10/04/2015  . Pneumococcal Conjugate-13 02/07/2016  . Pneumococcal-Unspecified 01/23/2014  . Tdap 12/09/2014  .  Zoster 03/19/2012    Current Medications, Allergies, Past Medical History, Past Surgical History, Family History, and Social History were reviewed in [employer] record.   Review of Systems             All  symptoms NEG except where BOLDED >>  Constitutional:  F/C/S, fatigue, anorexia, unexpected weight change. HEENT:  HA, visual changes, hearing loss, earache, nasal symptoms, sore throat, mouth sores, hoarseness. Resp:  cough, sputum, hemoptysis; SOB, tightness, wheezing. Cardio:  CP, palpit, DOE, orthopnea, edema. GI:  N/V/D/C, blood in stool; reflux, abd pain, distention, gas. GU:  dysuria, freq, urgency, hematuria, flank pain, voiding difficulty. MS:  joint pain, swelling, tenderness, decr ROM; neck pain, back pain, etc. Neuro:  HA, tremors, seizures, dizziness, syncope, weakness, numbness, gait abn. Skin:  suspicious lesions or skin rash. Heme:  adenopathy, bruising, bleeding. Psyche:  confusion, agitation, sleep disturbance, hallucinations, anxiety, depression suicidal.   Objective:   Physical Exam       Vital Signs:  Reviewed...   General:  WD, WN, 84 y/o WF in NAD; alert & oriented; pleasant & cooperative... HEENT:  [place]/AT; Conjunctiva- pink, Sclera- nonicteric, EOM-wnl, PERRLA, EACs-clear, TMs-wnl; NOSE-clear; THROAT-clear & wnl.  Neck:  Supple w/ fair ROM; no JVD; normal carotid impulses w/o bruits; no thyromegaly or nodules palpated; no lymphadenopathy.  Chest:  Clear to P & A; without wheezes, rales, or rhonchi heard. Heart:  Regular Rhythm; Gr1/6 SEM, without rubs or gallops detected. Abdomen:  Soft & nontender- no guarding or rebound; normal bowel sounds; no organomegaly or masses palpated. Ext:  decrROM; without deformities +arthritic changes; no varicose veins, venous insuffic, or edema;  Pulses intact w/o bruits. Neuro:  CNs II-XII intact; motor testing normal; sensory testing normal; gait normal & balance OK. Derm:  No lesions noted; no rash etc. Lymph:  No cervical, supraclavicular, axillary, or inguinal adenopathy palpated.   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    Abnormal PFT w/ norm airflow, norm lung volumes, air trapping & decr DLCO=56%>  This is a baseline PFT study done 02/2015 & Amio was started 10/2014; DLCO also affected by lung vol & anemia...    CT Chest 10/2014 had some CHF w/ interstitial prominence, sm bilat effusions, but improved adenopathy & diminished pulm nodules s/o chemotherapy for lymphoma    HBP> on MetopER25Bid, Lasix40AM&20PM, K20    Hx PAF> on Eliquis5Bid, Amiodarone200; she is holding NSR on these meds...    Chr diastolic CHF> on meds above; last BNP was 11/2014= 378...    Hyperlipidemia> on Prav80 + Fish Oil    Hypothyroid> on Synthroid100    Osteopenia and T12 compression    Hx non-hodgkins lymphoma> dx 6/16 & treated w/ 4 cycles chemoRx (last 10/28/14)    Anemia> on FeBid since 6/16 & followed by DrSherrill; last CBC 03/11/15 showed Hg=10.6, MCV=81; will discuss w/ DrSherrill re: poss IV iron therapy & she definitely needs to get back on her B12 supplementation.    Hx B12 defic> she is not currently taking B12 shots or supplements & B12 level 04/2015 = 210; rec to restart her B12 shots 1060mg/mo (admit by her RN daughter)...   PLAN >>   05/10/15>   SCydindicates that her DOE is improving, getting better "It helps me to walk"  she notes;  We are indered by the timing of her studies- starting on Amio 10/2014, last CTChest 10/2014, 1st PFT 02/2015, her chronic diastolic CHF, mild anemia, etc... She will continue same meds, continue to exercise, f/u w/ Oncology & we will recheck pt in 6 weeks...  06/29/15>   She is rec to continue current meds; restart B12 shots monthly per her daughter who is an Therapist, sports, refer to ENT for Bx/ excision of left lower lip lesion. 10/04/15>   Note- pt has O2 per DrSherrill & states "I use it at night when I need it for SOB"; OK 2017 Flu vaccine today; we reviewed low carb diet & incr exercise program... we plan rov recheck 4mo3/19/18>   We discussed  how this dyspnea is diff than her prev SOB w/ AFib & CHF- esp how she feels SOB w/ ADLs (can't get a deep breath "IN") but able to work-out on her exercise bike Bid w/o difficulty;  In this regard I have rec a trial of KLONOPIN 0.'5mg'$  1/2 to 1 tab po Bid to relax her chest wall muscles and allow a deeper more satisfying breath; we plan rov recheck to document response in 1547mo 05/08/16>   Klonopin is really helping her dyspnea; today c/o right knee pain & we'll try Tramadol + refer to Ortho... 08/07/16>   ShMagdelines stable, both clinically & by PFTs, rec to contiunue same meds and follow up 47m59mo/16/19>   Amanda Sellers stable overall- she has not required breathing meds other than KLONOPIN 0.'5mg'$  - 1/2 tab bid for anxiety & this helps her breathing; requests TRAMADOL for leg discomfort..   Plan:     Patient's Medications  New Prescriptions   No medications on file  Previous Medications   ALPRAZOLAM (XANAX) 0.5 MG TABLET    1 tab po qhs prn anxiety-related insomnia   AMIODARONE (PACERONE) 200 MG TABLET    Take 0.5 tablets (100 mg total) by mouth daily.   APIXABAN (ELIQUIS) 5 MG TABS TABLET    Take 1 tablet (5 mg total) by mouth 2 (two) times daily.   CYANOCOBALAMIN (,VITAMIN B-12,) 1000 MCG/ML INJECTION    Inject 1 mL (1,000 mcg total) into the skin every 30 (thirty) days.   FERROUS SULFATE 325 (65 FE) MG TABLET    Take 1 tablet (325 mg total) by mouth 2 (two) times daily with a meal.   FISH OIL-OMEGA-3 FATTY ACIDS 1000 MG CAPSULE    Take 2 g by mouth daily.   FUROSEMIDE (LASIX) 40 MG TABLET    TAKE ONE TABLET BY MOUTH TWICE DAILY   LEVOTHYROXINE (SYNTHROID, LEVOTHROID) 100 MCG TABLET    TAKE ONE TABLET BY MOUTH DAILY BEFORE BREAKFAST   METOPROLOL SUCCINATE (TOPROL-XL) 25 MG 24 HR TABLET    Take 0.5 tablets (12.5 mg total) by mouth daily.   OXYGEN    Inhale into the lungs. 1Lt at bedtime   POTASSIUM CHLORIDE 20 MEQ TBCR    Take 20 mEq by mouth daily.   PRAVASTATIN (PRAVACHOL) 80 MG TABLET    TAKE ONE  TABLET BY MOUTH DAILY   VITAMIN D, ERGOCALCIFEROL, (DRISDOL) 50000 UNITS CAPS CAPSULE    TAKE 1 CAPSULE BY MOUTH EVERY SUNDAY  Modified Medications   Modified Medication Previous Medication   CLONAZEPAM (KLONOPIN) 0.5 MG TABLET clonazePAM (KLONOPIN) 0.5 MG tablet      Take 1/2 tablet by mouth two times daily as needed for shortness of breath    Take 1/2 to  1 tablet by mouth two times daily as needed for shortness of breath   TRAMADOL (ULTRAM) 50 MG TABLET traMADol (ULTRAM) 50 MG tablet      Take 1 tablet (50 mg total) by mouth 2 (two) times daily. For arthritis pain    Take 1 tablet (50 mg total) by mouth 2 (two) times daily. For arthritis pain  Discontinued Medications   PROCHLORPERAZINE (COMPAZINE) 10 MG TABLET    Take 10 mg by mouth every 8 (eight) hours as needed for nausea or vomiting. Reported on 08/05/2015

## 2017-02-07 NOTE — Patient Instructions (Signed)
Today we updated your med list in our EPIC system...    Continue your current medications the same...  We refilled your KLONOPIN 0.5mg  tabs- 1/2 tab twice daily for your shortness of breath...  We refilled your TRAMADOL 50mg  tabs every 8h as needed for pain...  Continue all the meds from DrMcGowen & DrHarding as you are doing...  Call for any questions...  Let's plan a follow up visit in 57mo, sooner if needed for problems.Marland KitchenMarland Kitchen

## 2017-02-09 ENCOUNTER — Telehealth: Payer: Self-pay | Admitting: Pulmonary Disease

## 2017-02-09 NOTE — Telephone Encounter (Signed)
PA request for Tramadol received from [facility].  Attempted to initiate PA via [email or web address] but could not initiate d/t name and DOB not matching with insurance info.  Called insurance at [phone], was advised that no PA is needed but the pharmacy needs to override the rx to dispense the full 30 day supply.  Pharmacy is to cll [phone] and ask for invalid day code and dose change supply code.   [place] to make aware.  Nothing further needed.

## 2017-02-10 DIAGNOSIS — I10 Essential (primary) hypertension: Secondary | ICD-10-CM | POA: Diagnosis not present

## 2017-02-10 DIAGNOSIS — J9600 Acute respiratory failure, unspecified whether with hypoxia or hypercapnia: Secondary | ICD-10-CM | POA: Diagnosis not present

## 2017-02-14 ENCOUNTER — Encounter (HOSPITAL_COMMUNITY): Payer: Self-pay | Admitting: Nurse Practitioner

## 2017-02-14 ENCOUNTER — Ambulatory Visit (HOSPITAL_COMMUNITY)
Admission: RE | Admit: 2017-02-14 | Discharge: 2017-02-14 | Disposition: A | Discharge status: Home / Self Care | Payer: PPO | Source: Ambulatory Visit | Attending: Nurse Practitioner | Admitting: Nurse Practitioner

## 2017-02-14 VITALS — BP 140/62 | HR 45 | Ht 66.0 in | Wt 193.6 lb

## 2017-02-14 DIAGNOSIS — E039 Hypothyroidism, unspecified: Secondary | ICD-10-CM | POA: Insufficient documentation

## 2017-02-14 DIAGNOSIS — Z800 Family history of malignant neoplasm of digestive organs: Secondary | ICD-10-CM | POA: Diagnosis not present

## 2017-02-14 DIAGNOSIS — I130 Hypertensive heart and chronic kidney disease with heart failure and stage 1 through stage 4 chronic kidney disease, or unspecified chronic kidney disease: Secondary | ICD-10-CM | POA: Insufficient documentation

## 2017-02-14 DIAGNOSIS — Z90710 Acquired absence of both cervix and uterus: Secondary | ICD-10-CM | POA: Insufficient documentation

## 2017-02-14 DIAGNOSIS — R0609 Other forms of dyspnea: Secondary | ICD-10-CM | POA: Diagnosis not present

## 2017-02-14 DIAGNOSIS — J449 Chronic obstructive pulmonary disease, unspecified: Secondary | ICD-10-CM | POA: Insufficient documentation

## 2017-02-14 DIAGNOSIS — E559 Vitamin D deficiency, unspecified: Secondary | ICD-10-CM | POA: Insufficient documentation

## 2017-02-14 DIAGNOSIS — Z808 Family history of malignant neoplasm of other organs or systems: Secondary | ICD-10-CM | POA: Insufficient documentation

## 2017-02-14 DIAGNOSIS — Z79890 Hormone replacement therapy (postmenopausal): Secondary | ICD-10-CM | POA: Insufficient documentation

## 2017-02-14 DIAGNOSIS — D509 Iron deficiency anemia, unspecified: Secondary | ICD-10-CM | POA: Insufficient documentation

## 2017-02-14 DIAGNOSIS — Z7901 Long term (current) use of anticoagulants: Secondary | ICD-10-CM | POA: Insufficient documentation

## 2017-02-14 DIAGNOSIS — E785 Hyperlipidemia, unspecified: Secondary | ICD-10-CM | POA: Insufficient documentation

## 2017-02-14 DIAGNOSIS — I481 Persistent atrial fibrillation: Secondary | ICD-10-CM | POA: Diagnosis not present

## 2017-02-14 DIAGNOSIS — Z9221 Personal history of antineoplastic chemotherapy: Secondary | ICD-10-CM | POA: Insufficient documentation

## 2017-02-14 DIAGNOSIS — M170 Bilateral primary osteoarthritis of knee: Secondary | ICD-10-CM | POA: Diagnosis not present

## 2017-02-14 DIAGNOSIS — Z79899 Other long term (current) drug therapy: Secondary | ICD-10-CM | POA: Diagnosis not present

## 2017-02-14 DIAGNOSIS — Z833 Family history of diabetes mellitus: Secondary | ICD-10-CM | POA: Insufficient documentation

## 2017-02-14 DIAGNOSIS — E538 Deficiency of other specified B group vitamins: Secondary | ICD-10-CM | POA: Diagnosis not present

## 2017-02-14 DIAGNOSIS — Z807 Family history of other malignant neoplasms of lymphoid, hematopoietic and related tissues: Secondary | ICD-10-CM | POA: Diagnosis not present

## 2017-02-14 DIAGNOSIS — Z91041 Radiographic dye allergy status: Secondary | ICD-10-CM | POA: Diagnosis not present

## 2017-02-14 DIAGNOSIS — N183 Chronic kidney disease, stage 3 (moderate): Secondary | ICD-10-CM | POA: Insufficient documentation

## 2017-02-14 DIAGNOSIS — Z85828 Personal history of other malignant neoplasm of skin: Secondary | ICD-10-CM | POA: Diagnosis not present

## 2017-02-14 DIAGNOSIS — R0600 Dyspnea, unspecified: Secondary | ICD-10-CM | POA: Diagnosis not present

## 2017-02-14 DIAGNOSIS — C8510 Unspecified B-cell lymphoma, unspecified site: Secondary | ICD-10-CM | POA: Diagnosis not present

## 2017-02-14 DIAGNOSIS — I4891 Unspecified atrial fibrillation: Secondary | ICD-10-CM | POA: Diagnosis present

## 2017-02-14 DIAGNOSIS — I5032 Chronic diastolic (congestive) heart failure: Secondary | ICD-10-CM | POA: Diagnosis not present

## 2017-02-14 DIAGNOSIS — F419 Anxiety disorder, unspecified: Secondary | ICD-10-CM | POA: Diagnosis not present

## 2017-02-14 NOTE — Progress Notes (Signed)
Patient ID: Amanda Sellers, female   DOB: September 15, 1932, 84 y.o.   MRN: [id number]           Primary Care Physician: Tammi Sou, MD Referring Physician: Dr. Ronni Rumble Amanda Sellers is a 84 y.o. female with a h/o  diastolic heart failure, afib, newly diagnosed with recent dx of lymphoma. She was admitted to [facility] 9/22 on IV dilt and given diuretics for acute on chronic systolic HF. It was arranged for to undergo TEE DCCV.  She had exertional dyspnea with PND & orthopnea.She felt very tired & had little energy. She  noted a few palpitations, but does not recall rapid irregular heart rates/rhythms.   She had TEE without thrombus and underwent DCCV that was successful with one shock of 120J. She did well post procedure and was seen by Dr. Jerilynn Mages. Croitoru and found stable for discharge. She was negative 2700cc since admit and weight was down 4 lbs with IV diuretics. D/C wt is 168.  Plan on d/c was for  uninterrupted anticoagulation for next 30 days, preferably lifelong barring bleeding complications. AFib clinic follow up in 1-2 weeks. Outpatient [facility], non-urgent. ASA stopped.   She was seen in the afib clinic, 10/10 and found to be in afib with rvr. She went into afib short time after cardioversion. EKG showed afib at 130-140 bpm. She was  not on any drugs for rate control.  Is taking DOAC without fail. Continues with chemotherapy for lymphoma.   She was started on BB and  returned to clinic 10/12 with better v rates. 80's with sitting and 90-100's with activity. She does not have any PND/orthopnea although she has slept better in her recliner for months now.No lower extremity edema. Her weight is stable at home.  She has  finished chemotherapy for non- hodgkin's lymphoma and had f/u Pet scan 10/26,  and CA appears to be in remission, so per pt, no more chemo for right now. She has been on DOAC now x one month so will start attempts to restore SR. Discussed options  amiodarone or tikosyn and she would prefer amiodarone as she has two neighbors on amiodarone and they are both in rhythm and tolerating drug well. She is not interested in a hospital stay for drug loading. She reports rash with recent contrast dye. Amiodarone was started 200 mg bid, to then go down to 200 mg a day and then would discuss cardioversion.  She asked to be seen 11/2,  in the afib clinic for increased shortness of breath yesterday and last night. She has slept in the recliner for months but did not feel as well last night and slept poorly, feeling more short of breath and fullness in her chest.She has noticed increased pedal edema. She did not take her amiodarone last night or this am. Ekg shows rate controlled afib at 82 bpm, qtc at 464 ms.PO on RA is 98%. Weight is up 2 lbs from last visit, 6 lbs since 10/17. Lasix dose was decreased to 40 mg daily from 40 mg am and 20 mg pm when seen by Dr. Ellyn Hack 10/22.. Pt has noticed weight climbing for several days. She is fairly comfortable when quiet and sitting.   She was asked to double lasix to 80 mg  x 3 days, and to reduce amiodarone 200 mg a day. She had lost 3 pounds on return to clinic several days later. Clinically, she is much improved with more energy and less shortness of  breath. Able to sleep well. Had enough energy to walk into clinic today without wheelchair and felt well enough to clean her house yesterday. Ideally,I would like to load amiodarone a while longer but will go ahead and  pursue DCCV sooner than later, as it is contributing to her heart failure. Compliant with blood thinner without missed doses On recent labs, TSH was elevated but daughter explained that she just started taking replacement correctly on an empty stomach, and sees PCP 11/16 and she expects him to adjust the dose.  11/14-  Returns to afib clinic with a smile on her face and reporting all the activities she has been doing over the last week, having returned to  sinus rhythm after cardioversion. Much more energy and breathing is much improved.  Fluid status is stable.  12/13- Returns to afib clinic for f/u and reports doing well No afib, no further shortness of breath or edema. She is back to her usual activities. Got a good report form the Ca unit recently. Tolerating amiodarone 200 mg a day.  Return to afib clinic 4/18, reports that she is staying in [place]. Saw pulmonology yesterday for some c/o of shortness of breath but breathing much improved since staying in SR. Xray no active process, thyroid, liver studies drawn yesterday, and normal. MD said he would call hr after results of tests were reviewed. Fluid status normal.  F/u in afib clinic 01/31/16, pt continues to do well without any afib. Her fluid status is normal. Last TSH, 3 months ago, showed mild increase and will check TSH and liver panel today. Her breathing is good other than the last week in the bitter cold weather  she does not breath as good outside.No bleeding issues with eliquis. Fluid status is stable on lasix.  F/u in afib 01/26/17  for shortness of breath. Pt has been having shortness of breath for some while but contributed it to her knees and difficultly in ambulating. She was seen by pulmonology in July with improved PFT's and was OK to continue amiodarone. She was seen in November by Dr. Ellyn Hack and shortness of breath was thought to be stable/chronic. She feels that her shortness of breath has worsened since Christmas.  If she walks to the bathroom and back she is entirely out of breath. Sleeps on 2 pillows and has not felt the need to increase to elevate her head more at night. Ekg today shows junctional rhythm at 46 bpm. She is breathless speaking and started walking to clinic room but had to stop and get w/c. PO 99%.  F/u in afib clinic 1/7, after reducing amiodarone to 1/2 tab a day as well BB to 1/2 tab a day for brady as well as increase in shortness of breath. She is feeling much better  today to the point she was able to walk to clinic and not use w/c. She is not breathless talking.  F/u in afib clinic, 1/23, She feels great! Saw her pulmonologist recently and she said he was very pleased with her lung status. She is walking with a walker and observed coming into the clinic, no exertional dyspnea noted.  Her  HR is Sinus in the 40's today, but she is not symptomatic. She checks her HR with a pulse ox several times a day and it usually runs around 53-60.   Today, she denies symptoms of palpitations, chest pain,  orthopnea, PND, mild chronic lower extremity edema, no dizziness, presyncope, syncope, or neurologic sequela.  Worsening dyspnea. The  patient is tolerating medications without difficulties and is otherwise without complaint today.   Past Medical History:  Diagnosis Date  . Anticoagulation adequate, Eliquis 10/16/2014  . Anxiety    clonaz helps this AND her breathing  . Chronic diastolic congestive heart failure ([place]) 44/01/270   Complicated by atrial fibrillation; Echo 07/23/14: mild LVH, EF 50-55%, grade 2 diast dysfxn.  NYHA class II.  Marland Kitchen Chronic renal insufficiency, stage III (moderate) (HCC)    GFR @ 50  . COPD (chronic obstructive pulmonary disease) ([place])    recently noted on Xray, does not have any problems  . Family history of adverse reaction to anesthesia    daughter has difficulty waking up  . Follicular lymphoma ([place])    Non Hodgkins B cell lymphoma; s/p chemo summer 2016--in remission as of 03/2016.  Marland Kitchen HLD (hyperlipidemia)     06/2014  . HTN (hypertension)   . Hypothyroidism   . Lip cancer    Dr. Janace Hoard excised this: invasive SCC--no sign of cancer at ENT f/u 10/2015  . Microcytic anemia    transfused 3 U total in hosp 07/2014  . Olecranon bursitis of right elbow 02/2015  . Pancytopenia due to antineoplastic chemotherapy ([place])   . Persistent atrial fibrillation (HCC)    With RVR; elec cardioversion 11/30/14.  On Amio since 10/2014; Dr. Lenna Gilford following  her from pulm standpoint regarding this med.  Marland Kitchen Positive occult stool blood test 08/08/14   Endoscopies ok 08/2014  . Primary osteoarthritis of both knees 04/2016   Dr. Gladstone Lighter: bone on bone--to get euflexxa to both knees.  . Pulmonary metastases ([place]) 07/24/2014  . Shortness of breath dyspnea    with exertion.  Dr. Lenna Gilford started her on clonazepam to try to relax her chest wall and allow more satisfying deep breath. (03/2016).  . Vitamin B 12 deficiency   . Vitamin D deficiency    Past Surgical History:  Procedure Laterality Date  . ABDOMINAL HYSTERECTOMY  1978  . BONE MARROW BIOPSY  07/24/14  . BREAST BIOPSY    . CARDIOVERSION N/A 10/16/2014   Procedure: CARDIOVERSION;  Surgeon: Lelon Perla, MD;  Location: [facility] ENDOSCOPY;  Service: Cardiovascular;  Laterality: N/A;  . CARDIOVERSION N/A 11/30/2014   Procedure: CARDIOVERSION;  Surgeon: Larey Dresser, MD;  Location: [facility] ENDOSCOPY;  Service: Cardiovascular;  Laterality: N/A;  . COLONOSCOPY  09/23/14   small hemorrhoids, otherwise normal (performed for IDA and heme+ stool)  . COLONOSCOPY    . MASS EXCISION Left 07/15/2015   Left lower lip mass--invasive SCC w/negative margins.  Procedure: EXCISION MASS;  Surgeon: Melissa Montane, MD;  Location: [place];  Service: ENT;  Laterality: Left;  Wedge excision left lower lip mass  . NM MYOVIEW LTD  10/29/2014   medium size mild surgery defect in the mid anterior and apical anterior location suggestive of breast attenuation. No reversibility. LOW RISK  . PFTs  02/2015   Restriction with diffusion defect: cardiologist referred her to pulm to help interpret PFTs and decide whether she has amiodarone toxicity  . TEE WITHOUT CARDIOVERSION N/A 10/16/2014   Procedure: TRANSESOPHAGEAL ECHOCARDIOGRAM (TEE);  Surgeon: Lelon Perla, MD;  Location: [facility] ENDOSCOPY;  Service: Cardiovascular;  Laterality: N/A;  . TRANSTHORACIC ECHOCARDIOGRAM  07/23/14   mild LVH, EF 50-55%, grade 2 diast dysfxn  . UPPER GI ENDOSCOPY   09/23/2014   small hiatus hernia, nodules in stomach biopsied (chronic active erosive atrophic gastritis with intestinal metaplasia--no dysplasia or malignancy) otherwise normal    Current  Outpatient Medications  Medication Sig Dispense Refill  . ALPRAZolam (XANAX) 0.5 MG tablet 1 tab po qhs prn anxiety-related insomnia 90 tablet 1  . amiodarone (PACERONE) 200 MG tablet Take 0.5 tablets (100 mg total) by mouth daily. 90 tablet 3  . apixaban (ELIQUIS) 5 MG TABS tablet Take 1 tablet (5 mg total) by mouth 2 (two) times daily. 20 tablet 5  . clonazePAM (KLONOPIN) 0.5 MG tablet Take 1/2 tablet by mouth two times daily as needed for shortness of breath 60 tablet 5  . cyanocobalamin (,VITAMIN B-12,) 1000 MCG/ML injection Inject 1 mL (1,000 mcg total) into the skin every 30 (thirty) days. 1 mL 12  . ferrous sulfate 325 (65 FE) MG tablet Take 1 tablet (325 mg total) by mouth 2 (two) times daily with a meal. 30 tablet 1  . fish oil-omega-3 fatty acids 1000 MG capsule Take 2 g by mouth daily.    . furosemide (LASIX) 40 MG tablet TAKE ONE TABLET BY MOUTH TWICE DAILY 60 tablet 10  . levothyroxine (SYNTHROID, LEVOTHROID) 100 MCG tablet TAKE ONE TABLET BY MOUTH DAILY BEFORE BREAKFAST 90 tablet 1  . metoprolol succinate (TOPROL-XL) 25 MG 24 hr tablet Take 0.5 tablets (12.5 mg total) by mouth daily. 180 tablet 3  . OXYGEN Inhale into the lungs. 1Lt at bedtime    . potassium chloride 20 MEQ TBCR Take 20 mEq by mouth daily. 30 tablet 3  . pravastatin (PRAVACHOL) 80 MG tablet TAKE ONE TABLET BY MOUTH DAILY 90 tablet 3  . traMADol (ULTRAM) 50 MG tablet Take 1 tablet (50 mg total) by mouth 2 (two) times daily. For arthritis pain 60 tablet 5  . Vitamin D, Ergocalciferol, (DRISDOL) 50000 units CAPS capsule TAKE 1 CAPSULE BY MOUTH EVERY SUNDAY 12 capsule 1   No current facility-administered medications for this encounter.     Allergies  Allergen Reactions  . Iodinated Diagnostic Agents Rash and Other (See  Comments)    CT Contrast    Social History   Socioeconomic History  . Marital status: Married    Spouse name: Not on file  . Number of children: 8  . Years of education: Not on file  . Highest education level: Not on file  Social Needs  . Financial resource strain: Not on file  . Food insecurity - worry: Not on file  . Food insecurity - inability: Not on file  . Transportation needs - medical: Not on file  . Transportation needs - non-medical: Not on file  Occupational History  . Occupation: retired  Tobacco Use  . Smoking status: Never Smoker  . Smokeless tobacco: Never Used  Substance and Sexual Activity  . Alcohol use: No    Alcohol/week: 0.0 oz  . Drug use: No  . Sexual activity: Not on file  Other Topics Concern  . Not on file  Social History Narrative   Widowed, has 8 children.   Orig from [place], now lives in [place].   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   No tob/alc/drugs.    Family History  Problem Relation Age of Onset  . Melanoma Mother   . Stomach cancer Father   . Lymphoma Sister   . Diabetes Daughter     ROS- All systems are reviewed and negative except as per the HPI above  Physical Exam: Vitals:   02/14/17 1025  BP: 140/62  Pulse: (!) 45  Weight: 193 lb 9.6 oz (87.8 kg)  Height: _0  (1.676 m)    GEN- The patient is well appearing, alert and oriented x 3 today.   Head- normocephalic, atraumatic Eyes-  Sclera clear, conjunctiva pink Ears- hearing intact Oropharynx- clear Neck- supple, no JVP Lymph- no cervical lymphadenopathy Lungs- Clear to ausculation bilaterally, normal work of breathing Heart-  Slow regular rate and rhythm, no murmurs, rubs or gallops, PMI not laterally displaced GI- soft, NT, ND, + BS Extremities- no clubbing, cyanosis,  Trace edema MS- no significant deformity or atrophy Skin- no rash or lesion Psych- euthymic mood, full affect Neuro- strength and sensation are  intact  EKG- Sinus brady at at 45 bpm, pr int 204 ms, qrs int 94 ms, qtc 467 ms Epic records reviewed. Labs reviewed CXR-FINDINGS:01/26/17 Stable cardiomediastinal silhouette. No pneumothorax or pleural effusion is noted. No acute pulmonary disease is noted. Stable moderate size hiatal hernia. Stable old lower thoracic compression fracture.  IMPRESSION: No active cardiopulmonary disease. Stable moderate size hiatal hernia.   Assessment and Plan: 1. Persistent symptomatic afib  HR was  improved in the 50's with less BB/amio when first check back on both doses a month ago Pt reports HR at home checked several times a day in the 50-60 range and she feels very well now, no dizziness or shortness of breath She will continue to monitor HR at home with pulse ox and if HR is in the 40's consistently,  She sis to call clinic and I will stop metoprolol Continue 100 mg amiodarone day as well as 12.5 mg metoprolol ER daily Continue apixaban for chadsvasc score of at least 4   2.Dyspnea Improved with less amio/BB   3. Non Hodgkns B cell lymphoma S/p chemotherapy  4. Diastolic heart failure Continue Lasix 40 mg bid Fluid status stable  F/u here in 6 months, sooner if needed F/u with Pulmonology/ Dr. Ellyn Hack as needed  Geroge Baseman. Shana Zavaleta, [facility] [address] [place], [place] [address] [phone]

## 2017-02-14 NOTE — Patient Instructions (Signed)
Call if consistently seeing heart rates in the 40s.

## 2017-02-16 ENCOUNTER — Telehealth: Payer: Self-pay | Admitting: Oncology

## 2017-02-16 NOTE — Telephone Encounter (Signed)
Spoke to patients daughter regarding voicemail she left earlier. Patient wanted to reschedule March appointments from 3/18 to 3/15.

## 2017-02-27 ENCOUNTER — Other Ambulatory Visit: Payer: Self-pay | Admitting: Family Medicine

## 2017-03-13 DIAGNOSIS — J9600 Acute respiratory failure, unspecified whether with hypoxia or hypercapnia: Secondary | ICD-10-CM | POA: Diagnosis not present

## 2017-03-13 DIAGNOSIS — I10 Essential (primary) hypertension: Secondary | ICD-10-CM | POA: Diagnosis not present

## 2017-03-29 ENCOUNTER — Other Ambulatory Visit: Payer: Self-pay | Admitting: Family Medicine

## 2017-04-06 ENCOUNTER — Inpatient Hospital Stay: Payer: PPO | Attending: Oncology

## 2017-04-06 ENCOUNTER — Inpatient Hospital Stay: Payer: PPO | Admitting: Oncology

## 2017-04-06 ENCOUNTER — Telehealth: Payer: Self-pay

## 2017-04-06 VITALS — BP 157/66 | HR 64 | Temp 98.5°F | Resp 19 | Ht 66.0 in | Wt 194.1 lb

## 2017-04-06 DIAGNOSIS — D508 Other iron deficiency anemias: Secondary | ICD-10-CM

## 2017-04-06 DIAGNOSIS — Z8572 Personal history of non-Hodgkin lymphomas: Secondary | ICD-10-CM | POA: Diagnosis not present

## 2017-04-06 DIAGNOSIS — D6489 Other specified anemias: Secondary | ICD-10-CM | POA: Diagnosis not present

## 2017-04-06 LAB — CBC WITH DIFFERENTIAL/PLATELET
Basophils Absolute: 0 10*3/uL (ref 0.0–0.1)
Basophils Relative: 0 %
Eosinophils Absolute: 0.1 10*3/uL (ref 0.0–0.5)
Eosinophils Relative: 2 %
HCT: 35.6 % (ref 34.8–46.6)
Hemoglobin: 10.8 g/dL — ABNORMAL LOW (ref 11.6–15.9)
Lymphocytes Relative: 28 %
Lymphs Abs: 1.3 10*3/uL (ref 0.9–3.3)
MCH: 24.7 pg — ABNORMAL LOW (ref 25.1–34.0)
MCHC: 30.3 g/dL — ABNORMAL LOW (ref 31.5–36.0)
MCV: 81.3 fL (ref 79.5–101.0)
Monocytes Absolute: 0.5 10*3/uL (ref 0.1–0.9)
Monocytes Relative: 10 %
Neutro Abs: 2.9 10*3/uL (ref 1.5–6.5)
Neutrophils Relative %: 60 %
Platelets: 155 10*3/uL (ref 145–400)
RBC: 4.38 MIL/uL (ref 3.70–5.45)
RDW: 16.1 % — ABNORMAL HIGH (ref 11.2–14.5)
WBC: 4.7 10*3/uL (ref 3.9–10.3)

## 2017-04-06 NOTE — Telephone Encounter (Signed)
Printed avs and calender of upcoming appointment. 

## 2017-04-06 NOTE — Progress Notes (Signed)
[place] OFFICE PROGRESS NOTE   Diagnosis: Non-Hodgkin's lymphoma  INTERVAL HISTORY:   Amanda Sellers returns as scheduled.  She feels well.  Good appetite.  She is working several days per week.  She has occasional dyspnea that improves with rest.  No palpable lymph nodes.  Objective:  Vital signs in last 24 hours:  Blood pressure (!) 157/66, pulse 64, temperature 98.5 F (36.9 C), temperature source Oral, resp. rate 19, height '5\' 6"'  (1.676 m), weight 194 lb 1.6 oz (88 kg), SpO2 98 %.    HEENT: Neck without mass Lymphatics: No cervical, supraclavicular, axillary, or inguinal nodes Resp: Lungs clear bilaterally Cardio: Regular rate and rhythm GI: No hepatosplenomegaly Vascular: No leg edema  Lab Results:  Lab Results  Component Value Date   WBC 4.7 04/06/2017   HGB 10.8 (L) 04/06/2017   HCT 35.6 04/06/2017   MCV 81.3 04/06/2017   PLT 155 04/06/2017   NEUTROABS 2.9 04/06/2017    CMP     Component Value Date/Time   NA 142 11/06/2016 1132   NA 143 11/17/2014 0801   K 4.1 11/06/2016 1132   K 3.6 11/17/2014 0801   CL 101 11/06/2016 1132   CO2 33 (H) 11/06/2016 1132   CO2 30 (H) 11/17/2014 0801   GLUCOSE 99 11/06/2016 1132   GLUCOSE 117 11/17/2014 0801   BUN 12 11/06/2016 1132   BUN 16.3 11/17/2014 0801   CREATININE 0.84 11/06/2016 1132   CREATININE 0.86 12/09/2014 0934   CREATININE 0.7 11/17/2014 0801   CALCIUM 8.7 11/06/2016 1132   CALCIUM 8.9 11/17/2014 0801   PROT 6.6 01/31/2016 0945   PROT 6.1 (L) 11/17/2014 0801   ALBUMIN 3.8 01/31/2016 0945   ALBUMIN 3.5 11/17/2014 0801   AST 20 01/31/2016 0945   AST 32 11/17/2014 0801   ALT 17 01/31/2016 0945   ALT 30 11/17/2014 0801   ALKPHOS 60 01/31/2016 0945   ALKPHOS 70 11/17/2014 0801   BILITOT 0.7 01/31/2016 0945   BILITOT 0.55 11/17/2014 0801   GFRNONAA 44 (L) 01/31/2016 0945   GFRAA 51 (L) 01/31/2016 0945    Medications: I have reviewed the patient's current  medications.   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 Severe microcytic anemia, status post transfusion with packed red blood cells 07/23/2014   Normal ferritin, low transferrin saturation, "scant" bone marrow iron stores, review of peripheral blood smear consistent with iron deficiency anemia   colonoscopy and upper endoscopy 09/23/2014 3. Exertional dyspnea /orthopnea-most likely secondary to congestive heart failure  4. History of B-12 deficiency  5. Rash over the trunk and extremities 08/18/2014. Appears consistent with a drug rash. Resolved 08/28/2014.  Rash 11/18/2014, likely secondary to a contrast allergy, diagnostic contrast will be listed as an allergy 6. Colonoscopy 06/03/2008. Medium sized internal hemorrhoids.  7. Admission with atrial flutter with a rapid ventricular response 10/15/2014-status post cardioversion 10/16/2014, maintained on  apixaban  Repeat cardioversion 11/30/2014  8. Right olecranon skin lesion-likely a lipoma or cyst 9. 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  There is no clinical evidence for progression of lymphoma.  She has stable mild anemia.  She will return for an office visit and CBC in 6 months.  She will contact us in the interim for new symptoms.  15 minutes  were spent with the patient today.  The majority of the time was used for counseling and coordination of care.  [name], MD  04/06/2017  11:40 AM

## 2017-04-09 ENCOUNTER — Ambulatory Visit: Payer: PPO | Admitting: Oncology

## 2017-04-09 ENCOUNTER — Other Ambulatory Visit: Payer: PPO

## 2017-04-10 DIAGNOSIS — J9600 Acute respiratory failure, unspecified whether with hypoxia or hypercapnia: Secondary | ICD-10-CM | POA: Diagnosis not present

## 2017-04-10 DIAGNOSIS — I10 Essential (primary) hypertension: Secondary | ICD-10-CM | POA: Diagnosis not present

## 2017-04-11 ENCOUNTER — Telehealth: Payer: Self-pay | Admitting: Oncology

## 2017-04-11 NOTE — Telephone Encounter (Signed)
8:58 am spoke with patient to confirm FMLA for caregiver, Mauricia Area has been successfully faxed to [facility] @ [phone].  Patient requested a copy to be mailed for personal records.

## 2017-04-21 ENCOUNTER — Other Ambulatory Visit: Payer: Self-pay | Admitting: Family Medicine

## 2017-04-21 DIAGNOSIS — Z23 Encounter for immunization: Secondary | ICD-10-CM

## 2017-04-23 NOTE — Telephone Encounter (Signed)
Katy  RF request for cyanocobalamin LOV: 11/06/16 Next ov: 05/07/17 Last written: 02/07/16 #53ml w/ 12RF  10/08/15 VitB12 [id number]  Please advise. Thanks.

## 2017-04-24 ENCOUNTER — Other Ambulatory Visit: Payer: Self-pay | Admitting: Family Medicine

## 2017-04-24 NOTE — Telephone Encounter (Signed)
[place]  RF request for Vitamin D LOV: 11/06/16 Next ov:  05/07/17 Last written: 09/20/16 #12 w/ 1RF  Please advise. Thanks.

## 2017-05-04 ENCOUNTER — Encounter: Payer: Self-pay | Admitting: Family Medicine

## 2017-05-07 ENCOUNTER — Encounter: Payer: Self-pay | Admitting: Family Medicine

## 2017-05-07 ENCOUNTER — Ambulatory Visit (INDEPENDENT_AMBULATORY_CARE_PROVIDER_SITE_OTHER): Payer: PPO | Admitting: Family Medicine

## 2017-05-07 ENCOUNTER — Telehealth: Payer: Self-pay | Admitting: Family Medicine

## 2017-05-07 VITALS — BP 156/74 | HR 51 | Temp 97.6°F | Resp 16 | Ht 64.0 in | Wt 195.0 lb

## 2017-05-07 DIAGNOSIS — E039 Hypothyroidism, unspecified: Secondary | ICD-10-CM

## 2017-05-07 DIAGNOSIS — E559 Vitamin D deficiency, unspecified: Secondary | ICD-10-CM | POA: Diagnosis not present

## 2017-05-07 DIAGNOSIS — I482 Chronic atrial fibrillation, unspecified: Secondary | ICD-10-CM

## 2017-05-07 DIAGNOSIS — Z0000 Encounter for general adult medical examination without abnormal findings: Secondary | ICD-10-CM | POA: Diagnosis not present

## 2017-05-07 DIAGNOSIS — I5022 Chronic systolic (congestive) heart failure: Secondary | ICD-10-CM | POA: Diagnosis not present

## 2017-05-07 DIAGNOSIS — N183 Chronic kidney disease, stage 3 unspecified: Secondary | ICD-10-CM

## 2017-05-07 LAB — CBC WITH DIFFERENTIAL/PLATELET
Basophils Absolute: 0 10*3/uL (ref 0.0–0.1)
Basophils Relative: 0.1 % (ref 0.0–3.0)
Eosinophils Absolute: 0.1 10*3/uL (ref 0.0–0.7)
Eosinophils Relative: 1.3 % (ref 0.0–5.0)
HCT: 33.5 % — ABNORMAL LOW (ref 36.0–46.0)
Hemoglobin: 10.8 g/dL — ABNORMAL LOW (ref 12.0–15.0)
Lymphocytes Relative: 23.6 % (ref 12.0–46.0)
Lymphs Abs: 1 10*3/uL (ref 0.7–4.0)
MCHC: 32.1 g/dL (ref 30.0–36.0)
MCV: 78.3 fl (ref 78.0–100.0)
Monocytes Absolute: 0.4 10*3/uL (ref 0.1–1.0)
Monocytes Relative: 10.1 % (ref 3.0–12.0)
Neutro Abs: 2.6 10*3/uL (ref 1.4–7.7)
Neutrophils Relative %: 64.9 % (ref 43.0–77.0)
Platelets: 172 10*3/uL (ref 150.0–400.0)
RBC: 4.28 Mil/uL (ref 3.87–5.11)
RDW: 17.6 % — ABNORMAL HIGH (ref 11.5–15.5)
WBC: 4.1 10*3/uL (ref 4.0–10.5)

## 2017-05-07 LAB — COMPREHENSIVE METABOLIC PANEL
ALT: 11 U/L (ref 0–35)
AST: 15 U/L (ref 0–37)
Albumin: 4.2 g/dL (ref 3.5–5.2)
Alkaline Phosphatase: 56 U/L (ref 39–117)
BUN: 14 mg/dL (ref 6–23)
CO2: 33 mEq/L — ABNORMAL HIGH (ref 19–32)
Calcium: 9 mg/dL (ref 8.4–10.5)
Chloride: 101 mEq/L (ref 96–112)
Creatinine, Ser: 0.86 mg/dL (ref 0.40–1.20)
GFR: 67.01 mL/min (ref >60.00)
Glucose, Bld: 102 mg/dL — ABNORMAL HIGH (ref 70–99)
Potassium: 4.3 mEq/L (ref 3.5–5.1)
Sodium: 143 mEq/L (ref 135–145)
Total Bilirubin: 0.5 mg/dL (ref 0.2–1.2)
Total Protein: 6.5 g/dL (ref 6.0–8.3)

## 2017-05-07 LAB — LDL CHOLESTEROL, DIRECT: Direct LDL: 87 mg/dL

## 2017-05-07 LAB — LIPID PANEL
Cholesterol: 183 mg/dL (ref 0–200)
HDL: 62.6 mg/dL (ref >39.00)
NonHDL: 120.66
Total CHOL/HDL Ratio: 3
Triglycerides: 202 mg/dL — ABNORMAL HIGH (ref 0.0–149.0)
VLDL: 40.4 mg/dL — ABNORMAL HIGH (ref 0.0–40.0)

## 2017-05-07 LAB — VITAMIN D 25 HYDROXY (VIT D DEFICIENCY, FRACTURES): VITD: 28 ng/mL — ABNORMAL LOW (ref 30.00–100.00)

## 2017-05-07 LAB — TSH: TSH: 2.52 u[IU]/mL (ref 0.35–4.50)

## 2017-05-07 MED ORDER — ALPRAZOLAM 0.5 MG PO TABS: ORAL_TABLET | ORAL | 0 refills | Status: DC

## 2017-05-07 NOTE — Progress Notes (Signed)
Office Note 05/07/2017  CC:  Chief Complaint  Patient presents with  . Annual Exam    Pt is fasting.     HPI:  Amanda Sellers is a 84 y.o. White female who is here accompanied by her daughter for annual health maintenance exam. Feeling fine; no acute complaint.  Checks bp daily at home: consistently <130/80s.  HR 50s consistently. 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 Diet: low sodium.    She takes alprazolam VERY RARELY--says it has been about a year since she last took it. Asks for small amount to have for prn severe anxiety "just in case". She is rx'd clonaz by Dr. Lenna Gilford to help calm her tendency to have SOB.  Past Medical History:  Diagnosis Date  . Anticoagulation adequate, Eliquis 10/16/2014  . Anxiety    clonaz helps this AND her breathing  . Chronic diastolic congestive heart failure ([place]) 58/85/0277   Complicated by atrial fibrillation; Echo 07/23/14: mild LVH, EF 50-55%, grade 2 diast dysfxn.  NYHA class II.  Marland Kitchen Chronic renal insufficiency, stage III (moderate) (HCC)    GFR @ 50  . COPD (chronic obstructive pulmonary disease) ([place])    recently noted on Xray, does not have any problems  . Family history of adverse reaction to anesthesia    daughter has difficulty waking up  . Follicular lymphoma ([place])    Non Hodgkins B cell lymphoma; s/p chemo summer 2016--in remission as of 03/2016.  Marland Kitchen HLD (hyperlipidemia)     06/2014  . HTN (hypertension)   . Hypothyroidism   . Lip cancer    Dr. Janace Hoard excised this: invasive SCC--no sign of cancer at ENT f/u 10/2015  . Microcytic anemia    transfused 3 U total in hosp 07/2014  . Obesity (BMI 30-39.9)   . Olecranon bursitis of right elbow 02/2015  . Pancytopenia due to antineoplastic chemotherapy ([place])   . Persistent atrial fibrillation (HCC)    With RVR; elec cardioversion 11/30/14.  On Amio since 10/2014; Dr. Lenna Gilford following her from pulm standpoint regarding this med.  Marland Kitchen Positive occult stool blood test  08/08/14   Endoscopies ok 08/2014  . Primary osteoarthritis of both knees 04/2016   Dr. Gladstone Lighter: bone on bone--to get euflexxa to both knees.  . Pulmonary metastases ([place]) 07/24/2014  . Shortness of breath dyspnea    with exertion.  Dr. Lenna Gilford started her on clonazepam to try to relax her chest wall and allow more satisfying deep breath. (03/2016).  . Vitamin B 12 deficiency   . Vitamin D deficiency     Past Surgical History:  Procedure Laterality Date  . ABDOMINAL HYSTERECTOMY  1978  . BONE MARROW BIOPSY  07/24/14  . BREAST BIOPSY    . CARDIOVERSION N/A 10/16/2014   Procedure: CARDIOVERSION;  Surgeon: Lelon Perla, MD;  Location[facility] ENDOSCOPY;  Service: Cardiovascular;  Laterality: N/A;  . CARDIOVERSION N/A 11/30/2014   Procedure: CARDIOVERSION;  Surgeon: Larey Dresser, MD;  Location: [facility] ENDOSCOPY;  Service: Cardiovascular;  Laterality: N/A;  . COLONOSCOPY  09/23/14   small hemorrhoids, otherwise normal (performed for IDA and heme+ stool)  . COLONOSCOPY    . MASS EXCISION Left 07/15/2015   Left lower lip mass--invasive SCC w/negative margins.  Procedure: EXCISION MASS;  Surgeon: Melissa Montane, MD;  Location: [facility];  Service: ENT;  Laterality: Left;  Wedge excision left lower lip mass  . NM MYOVIEW LTD  10/29/2014   medium size mild surgery defect in the  mid anterior and apical anterior location suggestive of breast attenuation. No reversibility. LOW RISK  . PFTs  02/2015   Restriction with diffusion defect: cardiologist referred her to pulm to help interpret PFTs and decide whether she has amiodarone toxicity  . TEE WITHOUT CARDIOVERSION N/A 10/16/2014   Procedure: TRANSESOPHAGEAL ECHOCARDIOGRAM (TEE);  Surgeon: Lelon Perla, MD;  Location: [facility] ENDOSCOPY;  Service: Cardiovascular;  Laterality: N/A;  . TRANSTHORACIC ECHOCARDIOGRAM  07/23/14   mild LVH, EF 50-55%, grade 2 diast dysfxn  . UPPER GI ENDOSCOPY  09/23/2014   small hiatus hernia, nodules in stomach biopsied (chronic active erosive  atrophic gastritis with intestinal metaplasia--no dysplasia or malignancy) otherwise normal    Family History  Problem Relation Age of Onset  . Melanoma Mother   . Stomach cancer Father   . Lymphoma Sister   . Diabetes Daughter     Social History   Socioeconomic History  . Marital status: Married    Spouse name: Not on file  . Number of children: 8  . Years of education: Not on file  . Highest education level: Not on file  Occupational History  . Occupation: retired  Scientific laboratory technician  . Financial resource strain: Not on file  . Food insecurity:    Worry: Not on file    Inability: Not on file  . Transportation needs:    Medical: Not on file    Non-medical: Not on file  Tobacco Use  . Smoking status: Never Smoker  . Smokeless tobacco: Never Used  Substance and Sexual Activity  . Alcohol use: No    Alcohol/week: 0.0 oz  . Drug use: No  . Sexual activity: Not on file  Lifestyle  . Physical activity:    Days per week: Not on file    Minutes per session: Not on file  . Stress: Not on file  Relationships  . Social connections:    Talks on phone: Not on file    Gets together: Not on file    Attends religious service: Not on file    Active member of club or organization: Not on file    Attends meetings of clubs or organizations: Not on file    Relationship status: Not on file  . Intimate partner violence:    Fear of current or ex partner: Not on file    Emotionally abused: Not on file    Physically abused: Not on file    Forced sexual activity: Not on file  Other Topics Concern  . Not on file  Social History Narrative   Widowed, has 8 children.   Orig from [place], now lives in [place].   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   No tob/alc/drugs.    Outpatient Medications Prior to Visit  Medication Sig Dispense Refill  . ALPRAZolam (XANAX) 0.5 MG tablet 1 tab po qhs prn anxiety-related insomnia 90 tablet 1  . amiodarone  (PACERONE) 200 MG tablet Take 0.5 tablets (100 mg total) by mouth daily. 90 tablet 3  . apixaban (ELIQUIS) 5 MG TABS tablet Take 1 tablet (5 mg total) by mouth 2 (two) times daily. 20 tablet 5  . clonazePAM (KLONOPIN) 0.5 MG tablet Take 1/2 tablet by mouth two times daily as needed for shortness of breath 60 tablet 5  . cyanocobalamin (,VITAMIN B-12,) 1000 MCG/ML injection INJECT 1 ML (1000 MCG TOTAL) INTO THE SKIN EVERY 30 DAYS. 1 mL 12  . ferrous sulfate 325 (65  FE) MG tablet Take 1 tablet (325 mg total) by mouth 2 (two) times daily with a meal. 30 tablet 1  . fish oil-omega-3 fatty acids 1000 MG capsule Take 2 g by mouth daily.    . furosemide (LASIX) 40 MG tablet TAKE ONE TABLET BY MOUTH TWICE DAILY 60 tablet 10  . levothyroxine (SYNTHROID, LEVOTHROID) 100 MCG tablet TAKE ONE TABLET BY MOUTH EVERY DAY BEFORE BREAKFAST 90 tablet 0  . metoprolol succinate (TOPROL-XL) 25 MG 24 hr tablet Take 0.5 tablets (12.5 mg total) by mouth daily. 180 tablet 3  . OXYGEN Inhale into the lungs. 1Lt at bedtime    . potassium chloride 20 MEQ TBCR Take 20 mEq by mouth daily. 30 tablet 3  . pravastatin (PRAVACHOL) 80 MG tablet TAKE ONE TABLET BY MOUTH EVERY DAY 90 tablet 3  . traMADol (ULTRAM) 50 MG tablet Take 1 tablet (50 mg total) by mouth 2 (two) times daily. For arthritis pain 60 tablet 5  . Vitamin D, Ergocalciferol, (DRISDOL) 50000 units CAPS capsule TAKE 1 CAPSULE BY MOUTH EVERY SUNDAY 12 capsule 3   No facility-administered medications prior to visit.     Allergies  Allergen Reactions  . Iodinated Diagnostic Agents Rash and Other (See Comments)    CT Contrast    ROS Review of Systems  Constitutional: Negative for appetite change, chills, fatigue and fever.  HENT: Negative for congestion, dental problem, ear pain and sore throat.   Eyes: Negative for discharge, redness and visual disturbance.  Respiratory: Positive for shortness of breath (with exertion--chronic). Negative for cough, chest  tightness and wheezing.   Cardiovascular: Positive for leg swelling (mild, chronic). Negative for chest pain and palpitations.  Gastrointestinal: Negative for abdominal pain, blood in stool, diarrhea, nausea and vomiting.  Genitourinary: Negative for difficulty urinating, dysuria, flank pain, frequency, hematuria and urgency.  Musculoskeletal: Negative for arthralgias, back pain, joint swelling, myalgias and neck stiffness.  Skin: Negative for pallor and rash.  Neurological: Negative for dizziness, speech difficulty, weakness and headaches.  Hematological: Negative for adenopathy. Does not bruise/bleed easily.  Psychiatric/Behavioral: Negative for confusion and sleep disturbance. The patient is not nervous/anxious.     PE; Blood pressure (!) 156/74, pulse (!) 51, temperature 97.6 F (36.4 C), temperature source Oral, resp. rate 16, height 5' 4" (1.626 m), weight 195 lb (88.5 kg), SpO2 99 %. Body mass index is 33.47 kg/m.  Gen: Alert, well appearing.  Patient is oriented to person, place, time, and situation. AFFECT: pleasant, lucid thought and speech. ENT: Ears: EACs clear, normal epithelium.  TMs with good light reflex and landmarks bilaterally.  Eyes: no injection, icteris, swelling, or exudate.  EOMI, PERRLA. Nose: no drainage or turbinate edema/swelling.  No injection or focal lesion.  Mouth: lips without lesion/swelling.  Oral mucosa pink and moist.  Dentition-has dentures.  Oropharynx without erythema, exudate, or swelling.  Neck: supple/nontender.  No LAD, mass, or TM.   CV: RRR, no m/r/g.   LUNGS: CTA bilat, nonlabored resps, good aeration in all lung fields. ABD: soft, NT, ND, BS normal.  No hepatospenomegaly or mass.  No bruits. EXT: no clubbing or cyanosis..  Musculoskeletal: no joint swelling, erythema, warmth, or tenderness.  ROM of all joints intact. Skin - no sores or suspicious lesions or rashes or color changes   Pertinent labs:  Lab Results  Component Value Date    TSH 4.17 07/03/2016   Lab Results  Component Value Date   WBC 4.7 04/06/2017   HGB 10.8 (L) 04/06/2017  HCT 35.6 04/06/2017   MCV 81.3 04/06/2017   PLT 155 04/06/2017   Lab Results  Component Value Date   CREATININE 0.84 11/06/2016   BUN 12 11/06/2016   NA 142 11/06/2016   K 4.1 11/06/2016   CL 101 11/06/2016   CO2 33 (H) 11/06/2016   Lab Results  Component Value Date   ALT 17 01/31/2016   AST 20 01/31/2016   ALKPHOS 60 01/31/2016   BILITOT 0.7 01/31/2016   Lab Results  Component Value Date   CHOL 199 07/03/2016   Lab Results  Component Value Date   HDL 64.40 07/03/2016   Lab Results  Component Value Date   LDLCALC 100 (H) 07/03/2016   Lab Results  Component Value Date   TRIG 176.0 (H) 07/03/2016   Lab Results  Component Value Date   CHOLHDL 3 07/03/2016   Lab Results  Component Value Date   HGBA1C 4.7 09/09/2014    ASSESSMENT AND PLAN:   Health maintenance exam: Reviewed age and gender appropriate health maintenance issues (prudent diet, regular exercise, health risks of tobacco and excessive alcohol, use of seatbelts, fire alarms in home, use of sunscreen).  Also reviewed age and gender appropriate health screening as well as vaccine recommendations. Vaccines: UTD. Shingrix discussed. Labs: fasting HP+ vit D (vit D def). Cervical ca screening: per her history in chart, she had hysterectomy for benign reason. Breast ca screening: she declines any further screening for this. Colon ca screening: last colonoscopy was 2016 and was done for IDA---internal hemorrhoids found but o/w normal.  No repeat indicated due to age.  An After Visit Summary was printed and given to the patient.  FOLLOW UP:  Return in about 6 months (around 11/06/2017) for routine chronic illness f/u.  Signed:  Crissie Sickles, MD           05/07/2017

## 2017-05-07 NOTE — Patient Instructions (Signed)

## 2017-05-07 NOTE — Telephone Encounter (Signed)
Yes, this is OK/authorized by me. Pt and family member have been counseled about not taking these two meds at the same time and expressed clear understanding of this.

## 2017-05-07 NOTE — Telephone Encounter (Signed)
Please advise. Thanks.  

## 2017-05-07 NOTE — Telephone Encounter (Signed)
Tamera Punt advised and voiced understanding.

## 2017-05-07 NOTE — Telephone Encounter (Signed)
Copied from [place] [id number]. Topic: Quick Communication - See Telephone Encounter >> May 07, 2017 10:40 AM Robina Ade, Helene Kelp D wrote: CRM for notification. See Telephone encounter for: 05/07/17. Chandler from [facility] called and wanted to ask the doctor if its ok for patient to be on ALPRAZolam (XANAX) 0.5 MG tablet and clonazePAM (KLONOPIN) 0.5 MG tablet at the same time. Please call pharmacy back ,thanks.

## 2017-05-08 ENCOUNTER — Encounter: Payer: Self-pay | Admitting: Family Medicine

## 2017-05-11 DIAGNOSIS — J9600 Acute respiratory failure, unspecified whether with hypoxia or hypercapnia: Secondary | ICD-10-CM | POA: Diagnosis not present

## 2017-05-11 DIAGNOSIS — I10 Essential (primary) hypertension: Secondary | ICD-10-CM | POA: Diagnosis not present

## 2017-05-15 ENCOUNTER — Other Ambulatory Visit: Payer: Self-pay | Admitting: *Deleted

## 2017-05-15 MED ORDER — VITAMIN D (ERGOCALCIFEROL) 1.25 MG (50000 UNIT) PO CAPS: 50000.0000 [IU] | ORAL_CAPSULE | ORAL | 3 refills | Status: DC

## 2017-05-17 ENCOUNTER — Telehealth: Payer: Self-pay | Admitting: Family Medicine

## 2017-05-17 NOTE — Telephone Encounter (Signed)
Pts daughter [name] was advised yesterday (05/16/17) see result note. Rx has already been sent to pharmacy.

## 2017-05-17 NOTE — Telephone Encounter (Signed)
Pt given results per notes of Dr. Anitra Lauth on 4/16/19Unable to document in result note due to result note not being routed to [facility].  Please enter the rx for the Ergocaliferol per order.   Notes recorded by Tammi Sou, MD on 05/08/2017 at 12:53 PM EDT All labs stable except vit D level low. Increase vit D to one 50,000 unit tab TWICE per week instead of just once per week. Pls do new rx if she needs one: Ergocalciferol 50,000 U tab, 1 tab twice per week, #24 (90d supply), RF x 3. Pls make sure she is still taking iron at least once a day.-thx

## 2017-05-21 IMAGING — NM NM MISC PROCEDURE
4 series · 24 of 24 positions shown · non-contrast
Comparison: none

[Series 1: wbr_r-proj_st wbr rest · 6.40mm/px · 6 of 64 frames shown]
[frame 6/64]
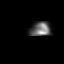
[frame 16/64]
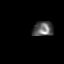
[frame 27/64]
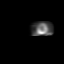
[frame 38/64]
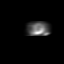
[frame 48/64]
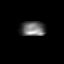
[frame 59/64]
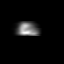

[Series 1: wbr rest · 6.40mm/px · 6 of 64 frames shown]
[frame 6/64]
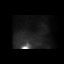
[frame 16/64]
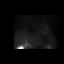
[frame 27/64]
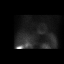
[frame 38/64]
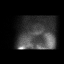
[frame 48/64]
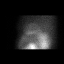
[frame 59/64]
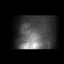

[Series 2: wbr_s-proj_st wbr stress ng · 6.40mm/px · 6 of 64 frames shown]
[frame 6/64]
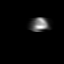
[frame 16/64]
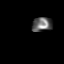
[frame 27/64]
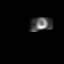
[frame 38/64]
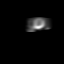
[frame 48/64]
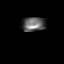
[frame 59/64]
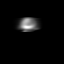

[Series 2: wbr stress ng · 6.40mm/px · 6 of 64 frames shown]
[frame 6/64]
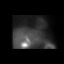
[frame 16/64]
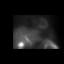
[frame 27/64]
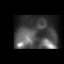
[frame 38/64]
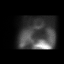
[frame 48/64]
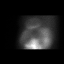
[frame 59/64]
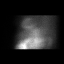

[24 of 24 positions shown; findings below may reference images not displayed]

Canned report from images found in remote index.

Refer to host system for actual result text.

## 2017-06-02 ENCOUNTER — Other Ambulatory Visit (HOSPITAL_COMMUNITY): Payer: Self-pay | Admitting: Nurse Practitioner

## 2017-06-05 ENCOUNTER — Telehealth: Payer: Self-pay | Admitting: Pulmonary Disease

## 2017-06-05 MED ORDER — CLONAZEPAM 0.5 MG PO TABS: ORAL_TABLET | ORAL | 5 refills | Status: DC

## 2017-06-05 NOTE — Telephone Encounter (Signed)
Called and spoke to pt's daughter, Adonis Brook. Unable to speak with christie, as she is not listed on pt's DPR. Christie requested I contact pt and request verbal to speak with Adonis Brook.  I have spoken to pt, who gave verbal to speak to [facility] regarding Klonopin.  Adonis Brook states pt has had been taking Klonopin 0.5mg - half to whole tablet during the day and a whole table at night to help with breathing and sleep. Adonis Brook states pt has ran out of medication and is requesting a new Rx to be written as 0.5-1 tablet during the day and 1 tab at night.  Adonis Brook stated that pt was without Klonopin last night and was unable to sleep due to difficulty breathing.  SN please advise. Thanks.   Current Outpatient Medications on File Prior to Visit  Medication Sig Dispense Refill  . ALPRAZolam (XANAX) 0.5 MG tablet 1 tab po qhs prn anxiety-related insomnia 30 tablet 0  . amiodarone (PACERONE) 200 MG tablet Take 0.5 tablets (100 mg total) by mouth daily. 90 tablet 3  . apixaban (ELIQUIS) 5 MG TABS tablet Take 1 tablet (5 mg total) by mouth 2 (two) times daily. 20 tablet 5  . clonazePAM (KLONOPIN) 0.5 MG tablet Take 1/2 tablet by mouth two times daily as needed for shortness of breath 60 tablet 5  . cyanocobalamin (,VITAMIN B-12,) 1000 MCG/ML injection INJECT 1 ML (1000 MCG TOTAL) INTO THE SKIN EVERY 30 DAYS. 1 mL 12  . ferrous sulfate 325 (65 FE) MG tablet Take 1 tablet (325 mg total) by mouth 2 (two) times daily with a meal. 30 tablet 1  . fish oil-omega-3 fatty acids 1000 MG capsule Take 2 g by mouth daily.    . furosemide (LASIX) 40 MG tablet TAKE ONE TABLET BY MOUTH TWICE DAILY 60 tablet 10  . levothyroxine (SYNTHROID, LEVOTHROID) 100 MCG tablet TAKE ONE TABLET BY MOUTH EVERY DAY BEFORE BREAKFAST 90 tablet 0  . metoprolol succinate (TOPROL-XL) 25 MG 24 hr tablet Take 0.5 tablets (12.5 mg total) by mouth daily. 180 tablet 3  . metoprolol succinate (TOPROL-XL) 25 MG 24 hr tablet Take 0.5 tablets (12.5 mg  total) by mouth daily. 45 tablet 3  . OXYGEN Inhale into the lungs. 1Lt at bedtime    . potassium chloride 20 MEQ TBCR Take 20 mEq by mouth daily. 30 tablet 3  . pravastatin (PRAVACHOL) 80 MG tablet TAKE ONE TABLET BY MOUTH EVERY DAY 90 tablet 3  . traMADol (ULTRAM) 50 MG tablet Take 1 tablet (50 mg total) by mouth 2 (two) times daily. For arthritis pain 60 tablet 5  . Vitamin D, Ergocalciferol, (DRISDOL) 50000 units CAPS capsule Take 1 capsule (50,000 Units total) by mouth 2 (two) times a week. 24 capsule 3   No current facility-administered medications on file prior to visit.     Allergies  Allergen Reactions  . Iodinated Diagnostic Agents Rash and Other (See Comments)    CT Contrast

## 2017-06-05 NOTE — Telephone Encounter (Signed)
Per SN -  Ok to order Klonopin 0.5mg  tabs, #60 1/2 -1 tab PO BID prn for anxiety, refill 5.

## 2017-06-05 NOTE — Telephone Encounter (Signed)
rx called in to preferred pharmacy.  Pt's daughter aware.  Nothing further needed.

## 2017-06-05 NOTE — Telephone Encounter (Signed)
Per Adonis Brook, states pt use [facility]. [phone] [place] Record [phone]

## 2017-06-05 NOTE — Telephone Encounter (Signed)
lmtcb for pt. Need to know what pharmacy to send this rx to.

## 2017-06-10 DIAGNOSIS — J9600 Acute respiratory failure, unspecified whether with hypoxia or hypercapnia: Secondary | ICD-10-CM | POA: Diagnosis not present

## 2017-06-10 DIAGNOSIS — I10 Essential (primary) hypertension: Secondary | ICD-10-CM | POA: Diagnosis not present

## 2017-07-02 ENCOUNTER — Other Ambulatory Visit: Payer: Self-pay | Admitting: Family Medicine

## 2017-07-09 DIAGNOSIS — M1711 Unilateral primary osteoarthritis, right knee: Secondary | ICD-10-CM | POA: Diagnosis not present

## 2017-07-09 DIAGNOSIS — M1712 Unilateral primary osteoarthritis, left knee: Secondary | ICD-10-CM | POA: Diagnosis not present

## 2017-07-11 DIAGNOSIS — J9600 Acute respiratory failure, unspecified whether with hypoxia or hypercapnia: Secondary | ICD-10-CM | POA: Diagnosis not present

## 2017-07-11 DIAGNOSIS — I10 Essential (primary) hypertension: Secondary | ICD-10-CM | POA: Diagnosis not present

## 2017-07-16 DIAGNOSIS — M1711 Unilateral primary osteoarthritis, right knee: Secondary | ICD-10-CM | POA: Diagnosis not present

## 2017-07-16 DIAGNOSIS — M1712 Unilateral primary osteoarthritis, left knee: Secondary | ICD-10-CM | POA: Diagnosis not present

## 2017-07-23 ENCOUNTER — Other Ambulatory Visit: Payer: Self-pay | Admitting: Family Medicine

## 2017-07-23 ENCOUNTER — Other Ambulatory Visit: Payer: Self-pay | Admitting: Cardiology

## 2017-07-23 DIAGNOSIS — M1712 Unilateral primary osteoarthritis, left knee: Secondary | ICD-10-CM | POA: Diagnosis not present

## 2017-07-23 DIAGNOSIS — M1711 Unilateral primary osteoarthritis, right knee: Secondary | ICD-10-CM | POA: Diagnosis not present

## 2017-07-23 NOTE — Telephone Encounter (Signed)
Looks like pts cardiologist changed the sig for this medication.   Please advise. Thanks.

## 2017-07-23 NOTE — Telephone Encounter (Signed)
Rx(s) sent to pharmacy electronically.  

## 2017-07-23 NOTE — Telephone Encounter (Signed)
This med RF request needs to be routed to pt's cardiologist.-thx

## 2017-08-06 ENCOUNTER — Encounter: Payer: Self-pay | Admitting: Pulmonary Disease

## 2017-08-06 ENCOUNTER — Ambulatory Visit: Payer: PPO | Admitting: Pulmonary Disease

## 2017-08-06 VITALS — BP 132/64 | HR 63 | Temp 98.0°F | Ht 64.0 in | Wt 200.6 lb

## 2017-08-06 DIAGNOSIS — Z7901 Long term (current) use of anticoagulants: Secondary | ICD-10-CM

## 2017-08-06 DIAGNOSIS — R0600 Dyspnea, unspecified: Secondary | ICD-10-CM | POA: Diagnosis not present

## 2017-08-06 DIAGNOSIS — R942 Abnormal results of pulmonary function studies: Secondary | ICD-10-CM | POA: Diagnosis not present

## 2017-08-06 DIAGNOSIS — I5032 Chronic diastolic (congestive) heart failure: Secondary | ICD-10-CM

## 2017-08-06 DIAGNOSIS — C8200 Follicular lymphoma grade I, unspecified site: Secondary | ICD-10-CM | POA: Diagnosis not present

## 2017-08-06 DIAGNOSIS — D649 Anemia, unspecified: Secondary | ICD-10-CM | POA: Diagnosis not present

## 2017-08-06 DIAGNOSIS — I10 Essential (primary) hypertension: Secondary | ICD-10-CM

## 2017-08-06 DIAGNOSIS — Z79899 Other long term (current) drug therapy: Secondary | ICD-10-CM

## 2017-08-06 DIAGNOSIS — I480 Paroxysmal atrial fibrillation: Secondary | ICD-10-CM

## 2017-08-06 MED ORDER — TRAMADOL HCL 50 MG PO TABS: 50.0000 mg | ORAL_TABLET | Freq: Two times a day (BID) | ORAL | 0 refills | Status: DC

## 2017-08-06 NOTE — Progress Notes (Signed)
Subjective:     Patient ID: Amanda Sellers, female   DOB: 1932/07/29, 84 y.o.   MRN: [id number]  HPI    84 y/o WF, referred by DrHarding due to an abnormal PFT revealing a low DLCO, pt is on amiodarone for AFib...   ~  May 10, 2015:  Initial Pulmonary consultation by SN>  She is here today w/ her daughter Amanda Sellers, an RN from the ICU at [facility];  Her PCP is DrPMcGowen in [place]...    84 y/o WF referred by DrHarding for a decr DLCO on Amiodarone for AFib... She notes DOE w/ exertion but states that it's getting better w/ a walking regimen & feels that she is getting stronger;  She has a hx of a follicular lymphoma and had chemoRx from DrSherrill (his note of 03/29/15 is reviewed)- diagnosed 06/2014 & treated w/ 4 cycles of bendamustine/ rituximab w/ f/u CT showing signif improvement in adenopathy, lung nodules and renal lesions;  NOTE> she was placed on O2 for nocturnal use by DrSherrill during her ChemoRx, uses it prn only but doesn't want to give it up!... She saw DrHarding last 01/2015 for f/u AFib & CHF- on Amiodarone since 11/2014;  NOTE> she states her breathing is much better since her successful 2nd DCCV=> NSR...  2DEcho 07/22/14:  Mild LVH, borderline LVF w/ EF=50-55%, no regional wall motion abn, Gr2DD, trivAI, calcif mitral annulus, mod LAdil at 63m, PAsys=320mg  Myoview 10/29/2014:  There is a medium defect of mild severity present in the mid anterior and apical anterior location. This is a low risk study. Breast attenuation artifact Study would not gate secondary to afib and PVCs.  TEE 10/16/2014: Normal LV function (EF 55-60%); biatrial enlargment; spontaneous contrast in LAA but no thrombus; mild AI; moderate MR (2+); mild TR.  Smoking Hx>  She is a never smoker.  Pulmonary Hx>  No prev hx of lung dis- denies hx asthma, no prev bouts of bronchitis, never had pneumonia, no hx TB or known exposure.    Cardiac Hx>  Adm 09/2014 w/ AFlutter & rvr, s/p cardioversion x2 (last 11/2014); HBP,  Chronic diastolic CHF, Valvular dis w/ modMR & mild AI  Medical Hx>  Hx non-Hodgkins Lymphoma 06/2014 treated w/ bendamustine/ rituximab by DrSherrill; Hx microcytic anemia & scant Fe stores c/w IDA, hx of B12 defic- prev on shots, Hypothyroid, Anxiety  Family Hx>  FamHx is NEG for any lung diseases...  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  Current Meds>  Last ChemoRx 10/2015; Amio200, Eliquis5Bid, ToprolXL25Bid, Lasix40AM&20PM, K20, Prav80, Synthroid100, Xanax0.5, Fe-Bid, VitD,   EXAM shows Afeb, VSS, O2sat=97% on RA at rest;  HEENT- neg, mallampati2;  Chest- clear w/o w/r/r;  Heart- RR Gr1/6 SEM w/o r/g;  Abd- soft, nontender, neg;  Ext- neg w/o c/c/e;  Neuro- intact...   Last CT Chest 11/17/14>  Improved thor adenopathy, modHH, norm heart size, diffuse atherosclerosis, R>L pleural effusions, diminished bilat pulm nodules (largest 3m67mIn lingula), mild subpleural reticulation & interstitial prom  Last CXR 11/25/14>  Cardiomegaly & pul vasc congestion, atherosclerotic calcif in Ao, peribronch thickening & flattened diaph w/ bibasilar atx, osteopenia and T12 compression (chronic)  PFTs 02/26/15>  FVC=2.12 (74%), FEV1=1.70 (80%), %1sec=80, mid-flows wnl at 91% predicted; TLC=5.41 (101%), RV=3.42 (136%), RV/TLC=63%; DLCO=56% predicted (DL/VA=82% predicted)  CXR today 05/10/15>  Borderline heart size, some hyperinflation noted, clear lungs, NAD...   LABS 05/10/15>  Chems-  ok x BS=110, Cr=1.13;  CBC- Hg=11.0, MCV=84;  Fe=52 (12%sat), Ferritin=37;  TSH=3.30, FreeT4=1.31;  B12=210;  Folate>23 IMP >>     Abnormal PFT w/ norm airflow, norm lung volumes, air trapping & decr DLCO=56%>  This is a baseline PFT study done 02/2015 & Amio was started 10/2014; DLCO also affected by lung vol & anemia...    CT Chest 10/2014 had some CHF w/ interstitial prominence, sm bilat effusions, but improved adenopathy & diminished pulm  nodules s/p chemotherapy for lymphoma    HBP> on MetopER25Bid, Lasix40AM&20PM, K20    Hx PAF> on Eliquis5Bid, Amiodarone200; she is holding NSR on these meds...    Chr diastolic CHF> on meds above; last BNP was 11/2014= 378...    Hyperlipidemia> on Prav80 + Fish Oil    Hypothyroid> on Synthroid100    Osteopenia and T12 compression    Hx non-hodgkins lymphoma> dx 6/16 & treated w/ 4 cycles chemoRx (last 10/28/14)    Anemia> on FeBid since 6/16 & followed by DrSherrill; last CBC 03/11/15 showed Hg=10.6, MCV=81; will discuss w/ DrSherrill re: poss IV iron therapy & she definitely needs to get back on her B12 supplementation.    Hx B12 defic> she is not currently taking B12 shots or supplements;  Vit B12 blood level 04/2015 off supplement for ?82yr 210 PLAN >>     SAadyaindicates that her DOE is improving, getting better "It helps me to walk" she notes;  We are hindered by the timing of her studies- starting on Amio 10/2014, last CTChest 10/2014, 1st PFT 02/2015, her chronic diastolic CHF, mild anemia, etc... She will continue same meds, continue to exercise, f/u w/ Oncology & we will recheck pt in 6 weeks...   ~  June 29, 2015:  6wk ROV w/ SN>  Amanda Sellers that her dyspnea is much improved overall- she denies CP, cough, sput, palpit, dizziness, edema, etc;  She says she developed a fever blister on her lower lip ~3wks ago, "couldn't get an appt w/ Derm until end of the month";  Now w/ large round lesion left side lower lip => refer to ENT NOW...   Prob list as noted above- on O2 "prn" per DrSherrill (she uses it occas at night), Eliquis5Bid, Amio200, Metop25Bid, Lasix40AM&20PM, K20, Prav80, Fe-Bid, VitD50K/wk, Synthroid100, Alpraz0.5prn... Her B12 level was 210 04/2015 & she is asked to start VitB12 shots Qmo again (daughter will give these to her at home)...    She had f/u AFib clinic 44/97/02 AFib, diastolicCHF; she had TEE & DCCV 09/2014, then recurrent AFib & again cardioverted 11/2014, maintaining  NSR since then- their extensive note is reviewed & she is much improved at present...    She saw DrSherrill 82/3/78>f/u on her follicular lymphoma, anemia (Fe de427f & hx B12 defic); felt to be in clinical remission & he continues to follow Q4m77mo    She saw DrMcGowen 06/25/15- note reviewed... IMP/PLAN>>   She is rec to continue current meds; restart B12 shots monthly per her daughter who is an RN,Therapist, sportsefer to ENT for Bx/ excision of left lower lip lesion... We plan ROV recheck in 8mo40mo ~  October 04, 2015:  8mo 91mow/ SN>  Amanda Sellers and notes some SOB in hot weather & w/ exertion; stress also contributes as her sister passed away recently; she remains too sedentary & not getting regular exercise... 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    CT Chest 10/2014 had some CHF w/ interstitial prominence, sm bilat effusions, but improved adenopathy & diminished pulm nodules s/p chemotherapy for lymphoma    HBP> on MetopER25Bid, Lasix40Bid, K20;  BP= 124/70 7 she denies CP, palpit, dizzy, edema...    Hx PAF> on Eliquis5Bid, Amiodarone200; she is holding NSR on these meds & followed by DrHarding- seen 08/09/15 w/ HBP, mild diastolicCHF & PAF; no change in meds...    Chr diastolic CHF> on meds above; last BNP was 11/2014= 378...    Hyperlipidemia> on Prav80 + Fish Oil; last FLP was 11/16 showing TChol 150, TG 135, HDL 49, LDL 74    Hypothyroid> on Synthroid100; last TSH was 4/17 showing TSH= 3.30    Osteopenia and T12 compression> Imaging reveals stable chr T12 compression fx; ?last BMD 2008 w/ lowest Tscore -2.3 in left FemNeck on WomensMVI, VitD 50K/wk, wt bearing exercise but no bone building  meds...    Hx non-hodgkins lymphoma> dx 6/16 & treated w/ 4 cycles chemoRx (last 10/28/14)- Imaging 10/16 w/ signif decr adenopathy in chest/ abd/ pelvis, resolution of splenomegaly, decr in pulm & renal nodularity, incr bilat pleural effusions & basilar atx; she last saw DrSherrill 08/05/15- good appetite & energy level, he felt she remained in remission & planed f/u 4mo    Anemia> on FeBid since 6/16 & followed by DrSherrill; last CBC 7/17 showed Hg=10.9, MCV=87...    Hx B12 defic> Vit B12 blood level 04/2015 off supplement for ?1yr= 210; we restarted VitB12 shots=> Vit B12 level 9/17 = 484 EXAM shows Afeb, VSS, O2sat=98% on RA at rest;  HEENT- neg, mallampati2, NEW- large lesion lower lip;  Chest- clear w/o w/r/r;  Heart- RR Gr1/6 SEM w/o r/g;  Abd- soft, nontender, neg;  Ext- VI w/o c/c/e;  Neuro- intact...  IMP/PLAN>>  Note- pt has O2 per DrSherrill & states "I use it at night when I need it for SOB"; OK 2017 Flu vaccine today; we reviewed low carb diet & incr exercise program... we plan rov recheck 6mo...  ~  April 10, 2016:  6mo ROV & Amanda Sellers is c/o breathing worse & notes SOB w/ ADLs x 2-3 wks now she says but she has no idea as to why? She denies cough, sput, hemoptysis; denies CP, palpit, dizzy, edema; just c/o SOB w/ activity & when probed further for more information she notes SOB w/ ADLs but she continues to work-out on her exercise bike 2x per day w/o difficulty & NOT SOB on this; she says the SOB w/ ADLs is a sensation of not being able to get a deep breath "IN" but she does not note any wheezing etc; she is not aware of any incr stress, she has Xanax but not taking... From the pulmonary standpoint she is a never smoker, had an isolated decr in DLCO on PFTs (on Amio), started on nocturnal O2 by DrSherrill while she was on chemotherapy for her Lymphoma w/ pulm nodules on CT (these diminished after Chemo as per her last CT Chest 10/2014), and is under treatment for her DiastolicCHF & doing satis on  Lasix40Bid... She has maintained regular f/u w/ her specialist>>    She had f/u visit w/ ENT-DrByers 11/19/15> s/p excision of squamous   cell ca left lower lip 07/15/15 & doing well...    She saw DrSherrill 11/26/15> f/u Non-hodgkins Lymphoma- it's been one year since last scans but there was no mention of re-checking CT Chest/ Abd/ Pelvis, they plan ROV in 58mo..    She was seen in the AF Clinic 14/6/96> DiastolicCHF & PAFib- s/p DCCV 9/17, placed on Eliquis, subseq return of AFib & had to start Amio200, Metoprolol=> now maintaining NSR & dyspnea improved after diuresis...    She saw her PCP- DrMcGowen 02/07/16> f/u HBP(controlled on Metoprolol, Lasix, KCl), HL (at goals on Prav80), Hypothy (stable on Synthroid100), CKD (stable w/ Cr=1.14)... EXAM shows Afeb, VSS, O2sat=100% on RA at rest;  HEENT- neg, mallampati2, lip lesion resolved;  Chest- clear w/o w/r/r;  Heart- RR Gr1/6 SEM w/o r/g;  Abd- soft, nontender, neg;  Ext- VI w/o c/c/e;  Neuro- intact...    CXR 04/10/16>  Mild cardiomeg, clear lungs w/o edema/ infiltrate/ or pleural effusions- NAD, modHH, mild degen changes in Tspine w/ compression of T12...  Ambulatory Oximetry 04/10/16>  O2sat=97% on RA at rest w/ pulse=56/min;  She walked just 2 Laps (185' ea) w/ lowest O2sat=97% w/ pulse up to 95/min... IMP/PLAN>>  We discussed how this dyspnea is diff than her prev SOB w/ AFib & CHF- esp how she feels SOB w/ ADLs (can't get a deep breath "IN") but able to work-out on her exercise bike Bid w/o difficulty;  In this regard I have rec a trial of KLONOPIN 0.'5mg'$  1/2 to 1 tab po Bid to relax her chest wall muscles and allow a deeper more satisfying breath; we plan rov recheck to document response in 165mo.  ~  May 08, 2016:  45m35moV & last visit we recommended Klonopin 0.'5mg'$  Bid for her dyspnea & she reports much improved!  Now her CC is knee pain & she ambulates w/ walker- [facility] med [facility]; we wrote for  TRAMADOL50 & will set her up w/ Ortho per request...     She had f/u DrSherrill 04/21/16>  Hx non-hodgkin's lymphoma, anemia, hx B12 defic, hx SCCa left lower lip excised 2017;  She remains in remission & is rec to continue on Iron therapy...  EXAM shows Afeb, VSS, O2sat=95% on RA at rest;  HEENT- neg, mallampati2, lip lesion resolved;  Chest- clear w/o w/r/r;  Heart- RR Gr1/6 SEM w/o r/g;  Abd- soft, nontender, neg;  Ext- VI w/o c/c/e;  Neuro- intact...   IMP/PLAN>>  We wrote for Tramadol & will refer her to Ortho to see if a shot would be helpful, discuss options;  We plan rov recheck in ~77mo44mo ~  August 07, 2016:  77mo 377mo& Delsy reports breathing well- still taking the Klonopin 0.'5mg'$  Bid for the dyspnea;  Her CC remains knee pain & she has seen Ortho- DrGioffre for cortisone shots w/ temp improvement; they also rec 5 lb ankle weights to strengthen her Quads, and they received insurance approval for "gel shots" (Euflexxa), uses brace and walker...  She has Home O2 and uses this just as needed- day or night...    She saw CARDS-DrHarding 4/23/2/95/28>b & Diastolic CHF;  On Amio, Eliquis, Metoprolol & Lasix;  Doing well x for knee pain, he notes that Klonopin rx is really helping her dypnea/ sleep/ etc; they are reluctant to decr the amio dose as she does not tol the AFib very well...    She saw PCP-DrMcGowen 07/03/16>  Medicare health maintenance exam, note reviewed- HBP, HL, hypothy, renal insuffic, DJD;  Family noted short term memory problems (MCI + tremor) but this appears minor, Labs ok x Hg=10.6, mcv=80 & followed by DrSherrill on oral iron supplement...  EXAM shows Afeb, VSS, O2sat=95% on RA at rest;  HEENT- neg, mallampati2, lip lesion resolved;  Chest- clear w/o w/r/r;  Heart- RR Gr1/6 SEM w/o r/g;  Abd- soft, nontender, neg;  Ext- VI w/o c/c/e;  Neuro- intact...    Labs 6/18>  Chems- ok w/ BS=121, Cr=0.93;  CBC- Hg=11.1, mcv=81, Fe=50 (14%sat) on oral Fe supplement;  TSH=4.17...  FullPFTs  08/08/16>  FVC=1.75 (63%), FEV1=1.36 (65%), %1sec=77%, mid-flows wnl at 84% predicted;  TLC=3.82 (71%), RV=1.91 (75%), RV/TLC=50%;  DLCO=60% predicted & DL/VA is 94% predicted... NOTE- DLCO is stable from 02/2015, no change...  IMP/PLAN>>  Amanda Sellers is stable, both clinically & by PFTs, rec to contiunue same meds and follow up 32mo..   ~  February 07, 2017:  621moOV & Amanda Sellers reports that her breathing is "great" & credits the improvement to a decrease in her Amio & Metoprolol doses (she was c/o incr SOB & DrHarding decreased the Amio/ BBlocker and she's improved)...  We reviewed the following interval medical notes in Epic>      She saw ENT- DrByers on 08/30/16> s/p excision of squamous cell ca left lower lip 07/15/15; eval & Bx of a left alveolar ridge ulceration & mass- results revealed benign tissue per Care Everywhere lab section...    She saw ONCOLOGY- LThomas,NP on 10/09/16>  Hx non-hodgkins lymphoma dx 2016 & treated w/ bendamustine/rituxan x 4 cycles by DrSherrill  & she remains in remission...    She saw PCP- DrMcGowen on 11/06/16>  Hx HBP, Afib, CHF, HL, RI, non-hodgkins lymphoma; note reviewed & she was felt to be stable, NO CHANGES MADE...    She saw CARDS- DCDyann Kiefn 01/29/17>  Hx Sist+DiastCHF, AFib- her excellent summary note is reviewed; on Eliquis5Bid, Amio100, MetopER25-1/2, Lasix40Bid, K20/d;  Stable- no further changes made... We reviewed the following medical problems during today's office visit>      S/P resection of invasive squamous cell ca on left lower lip by DrByers6/2017, & bx of benign left alveolar ridge mass on 8/18...    Abnormal PFT w/ norm airflow, norm lung volumes, air trapping & decr DLCO=56%>  This is a baseline PFT study done 02/2015 & Amio was started 10/2014; DLCO also affected by lung vol & anemia...    CT Chest 10/2014 had some CHF w/ interstitial prominence, sm bilat effusions, but improved adenopathy & diminished pulm nodules s/p chemotherapy for lymphoma    HBP>  on MetopER25/-1/2 daily, Lasix40Bid, K20;  BP= 120/70 & she denies CP, palpit, dizzy, edema...    Hx PAF> on Eliquis5Bid, Amiodarone200-1/2; she is holding NSR on these meds & followed by DrHarding- seen 1/19 w/ HBP, mild diastolicCHF & PAF; no change in meds...    Chr diastolic CHF> on meds above; last BNP was 11/2014= 378...    Hyperlipidemia> on Prav80 + Fish Oil; last FLP was 6/18 showing TChol 199, TG 176, HDL 64, LDL 100    Hypothyroid> on Synthroid100; last TSH was 6/18 showing TSH= 4.17    Osteopenia and T12 compression> Imaging reveals stable chr T12 compression fx; ?last BMD 2008 w/ lowest Tscore -2.3 in left FemNeck on WomensMVI, VitD 50K/wk, wt bearing exercise but no bone building meds...    Hx non-hodgkins lymphoma> dx 6/16 & treated w/ 4 cycles chemoRx (  last 10/28/14)- Imaging 10/16 w/ signif decr adenopathy in chest/ abd/ pelvis, resolution of splenomegaly, decr in pulm & renal nodularity, incr bilat pleural effusions & basilar atx; she last saw DrSherrill 08/05/15- good appetite & energy level, he felt she remained in remission & they continue to follow    Anemia> on FeBid since 6/16 & followed by DrSherrill; last CBC 9/18 showed Hg=10.6, MCV=82...    Hx B12 defic> Vit B12 blood level 04/2015 off supplement for ?17yr 210; we restarted VitB12 shots=> Vit B12 level 9/17 = 484 EXAM shows Afeb, VSS, O2sat=99% on RA at rest;  HEENT- neg, mallampati2, lip lesion resolved;  Chest- clear w/o w/r/r;  Heart- RR Gr1/6 SEM w/o r/g;  Abd- soft, nontender, neg;  Ext- VI w/o c/c/e;  Neuro- intact.  CXR 01/26/17 showed stable heart size, sl incr lung markings- NAD, mod HH & lower Tspine compression fx... IMP/PLAN>>  SCorissais stable overall- she has not required breathing meds other than KLONOPIN 0.'5mg'$  - 1/2 tab bid for anxiety & this helps her breathing; requests TRAMADOL for leg discomfort...    ~  August 06, 2017:  643moOV and pulmonary follow up visit>  ShAadhyaeturns for a routine 2m55mou visit & she  notes that her dyspnea continues to improve on her Klonopin 0.'5mg'$  taking 1/2 to 1 tab BID (usually taking 1/2 tab during the day & 1 tab at night);  This med relaxes her enough that she can get a good deep breath & not feel restricted of SOB in any way;  Her exercise is limited by her arthritis & she walks w/ a walker, Synvisc shots from DrGioffre have helped a little she says;  NOTE that her CXR 1/19 showed stable heart size, sl incr lung markings- NAD, mod HH, & lower Tspine compression fx;  Prior PFTs 7/18 showed FVC=1.75 (63%), FEV1=1.36 (65%), %1sec=77%, mid-flows wnl at 84% predicted;  TLC=3.82 (71%), RV=1.91 (75%), RV/TLC=50%;  DLCO=60% predicted & DL/VA is 94% predicted (DLCO is stable from 02/2015 on her AMIODARONE '100mg'$ /d, no change).  These numbers indicate some mild restrictive dis (overweight) and the decr DLCO (on Amio).  NOTE that Amanda Sellers is also on nocturnal O2 at 1L/min flow-- this was prescribed by ONCOLOGY- DrSherrill in 2016 when she was treated w/ Chemorx for her nonhodgkin's lymphoma-  She recalls having "lots of fluid" in her system back then; her daughter indicates that she uses the O2 Qhs now "more or less for piece of mind" and we discussed reassessing her need but they want to keep the O2 as is, so OK...         Past Medical History:  Diagnosis Date  . Anemia of chronic disease   . Anticoagulation adequate, Eliquis 10/16/2014  . Anxiety    clonaz helps this AND her breathing  . Chronic diastolic congestive heart failure ([place]CC[place]9/41/66/0630Complicated by atrial fibrillation; Echo 07/23/14: mild LVH, EF 50-55%, grade 2 diast dysfxn.  NYHA class II.  . CMarland Kitchenronic renal insufficiency, stage III (moderate) (HCC)    GFR @ 50  . COPD (chronic obstructive pulmonary disease) ([place]  recently noted on Xray, does not have any problems  . Family history of adverse reaction to anesthesia    daughter has difficulty waking up  . Follicular lymphoma ([place]  Non Hodgkins B cell  lymphoma; s/p chemo summer 2016--in remission as of 03/2016.  . HMarland KitchenD (hyperlipidemia)     06/2014  . HTN (hypertension)   .  Hypothyroidism   . Lip cancer    Dr. Janace Hoard excised this: invasive SCC--no sign of cancer at ENT f/u 10/2015  . Microcytic anemia    transfused 3 U total in hosp 07/2014  . Obesity (BMI 30-39.9)   . Olecranon bursitis of right elbow 02/2015  . Pancytopenia due to antineoplastic chemotherapy ([place])   . Persistent atrial fibrillation (HCC)    With RVR; elec cardioversion 11/30/14.  On Amio since 10/2014; Dr. Lenna Gilford following her from pulm standpoint regarding this med.  Marland Kitchen Positive occult stool blood test 08/08/14   Endoscopies ok 08/2014  . Primary osteoarthritis of both knees 04/2016   Dr. Gladstone Lighter: bone on bone--to get euflexxa to both knees.  . Pulmonary metastases ([place]) 07/24/2014  . Shortness of breath dyspnea    with exertion.  Dr. Lenna Gilford started her on clonazepam to try to relax her chest wall and allow more satisfying deep breath. (03/2016).  . Vitamin B 12 deficiency   . Vitamin D deficiency    dose increased to 50 K U tab TWICE weekly 04/2017    Past Surgical History:  Procedure Laterality Date  . ABDOMINAL HYSTERECTOMY  1978  . BONE MARROW BIOPSY  07/24/14  . BREAST BIOPSY    . CARDIOVERSION N/A 10/16/2014   Procedure: CARDIOVERSION;  Surgeon: Lelon Perla, MD;  Location: [facility] ENDOSCOPY;  Service: Cardiovascular;  Laterality: N/A;  . CARDIOVERSION N/A 11/30/2014   Procedure: CARDIOVERSION;  Surgeon: Larey Dresser, MD;  Location: [facility] ENDOSCOPY;  Service: Cardiovascular;  Laterality: N/A;  . COLONOSCOPY  09/23/14   small hemorrhoids, otherwise normal (performed for IDA and heme+ stool)  . COLONOSCOPY    . MASS EXCISION Left 07/15/2015   Left lower lip mass--invasive SCC w/negative margins.  Procedure: EXCISION MASS;  Surgeon: Melissa Montane, MD;  Location: [place];  Service: ENT;  Laterality: Left;  Wedge excision left lower lip mass  . NM MYOVIEW LTD  10/29/2014   medium  size mild surgery defect in the mid anterior and apical anterior location suggestive of breast attenuation. No reversibility. LOW RISK  . PFTs  02/2015   Restriction with diffusion defect: cardiologist referred her to pulm to help interpret PFTs and decide whether she has amiodarone toxicity  . TEE WITHOUT CARDIOVERSION N/A 10/16/2014   Procedure: TRANSESOPHAGEAL ECHOCARDIOGRAM (TEE);  Surgeon: Lelon Perla, MD;  Location: [facility] ENDOSCOPY;  Service: Cardiovascular;  Laterality: N/A;  . TRANSTHORACIC ECHOCARDIOGRAM  07/23/14   mild LVH, EF 50-55%, grade 2 diast dysfxn  . UPPER GI ENDOSCOPY  09/23/2014   small hiatus hernia, nodules in stomach biopsied (chronic active erosive atrophic gastritis with intestinal metaplasia--no dysplasia or malignancy) otherwise normal    Outpatient Encounter Medications as of 08/06/2017  Medication Sig  . ALPRAZolam (XANAX) 0.5 MG tablet 1 tab po qhs prn anxiety-related insomnia  . amiodarone (PACERONE) 200 MG tablet Take 0.5 tablets (100 mg total) by mouth daily.  Marland Kitchen apixaban (ELIQUIS) 5 MG TABS tablet Take 1 tablet (5 mg total) by mouth 2 (two) times daily.  . clonazePAM (KLONOPIN) 0.5 MG tablet Take 1/2-1 tab by mouth two times daily as needed for shortness of breath  . cyanocobalamin (,VITAMIN B-12,) 1000 MCG/ML injection INJECT 1 ML (1000 MCG TOTAL) INTO THE SKIN EVERY 30 DAYS.  . ferrous sulfate 325 (65 FE) MG tablet Take 1 tablet (325 mg total) by mouth 2 (two) times daily with a meal.  . fish oil-omega-3 fatty acids 1000 MG capsule Take 2 g by mouth daily.  Marland Kitchen  furosemide (LASIX) 40 MG tablet Take 1 tablet (40 mg total) by mouth 2 (two) times daily.  Marland Kitchen levothyroxine (SYNTHROID, LEVOTHROID) 100 MCG tablet TAKE ONE TABLET BY MOUTH EVERY DAY BEFORE BREAKFAST.  . metoprolol succinate (TOPROL-XL) 25 MG 24 hr tablet Take 0.5 tablets (12.5 mg total) by mouth daily.  . OXYGEN Inhale into the lungs. 1Lt at bedtime  . potassium chloride 20 MEQ TBCR Take 20 mEq by mouth  daily.  . pravastatin (PRAVACHOL) 80 MG tablet TAKE ONE TABLET BY MOUTH EVERY DAY  . traMADol (ULTRAM) 50 MG tablet Take 1 tablet (50 mg total) by mouth 2 (two) times daily. For arthritis pain  . Vitamin D, Ergocalciferol, (DRISDOL) 50000 units CAPS capsule Take 1 capsule (50,000 Units total) by mouth 2 (two) times a week.  . [DISCONTINUED] amiodarone (PACERONE) 200 MG tablet TAKE ONE TABLET BY MOUTH EVERY DAY  . [DISCONTINUED] metoprolol succinate (TOPROL-XL) 25 MG 24 hr tablet Take 0.5 tablets (12.5 mg total) by mouth daily.  . [DISCONTINUED] traMADol (ULTRAM) 50 MG tablet Take 1 tablet (50 mg total) by mouth 2 (two) times daily. For arthritis pain   No facility-administered encounter medications on file as of 08/06/2017.     Allergies  Allergen Reactions  . Iodinated Diagnostic Agents Rash and Other (See Comments)    CT Contrast    Immunization History  Administered Date(s) Administered  . Influenza, High Dose Seasonal PF 11/06/2016  . Influenza,inj,Quad PF,6+ Mos 10/27/2014, 10/04/2015  . Pneumococcal Conjugate-13 02/07/2016  . Pneumococcal-Unspecified 01/23/2014  . Tdap 12/09/2014  . Zoster 03/19/2012    Current Medications, Allergies, Past Medical History, Past Surgical History, Family History, and Social History were reviewed in [employer] record.   Review of Systems             All symptoms NEG except where BOLDED >>  Constitutional:  F/C/S, fatigue, anorexia, unexpected weight change. HEENT:  HA, visual changes, hearing loss, earache, nasal symptoms, sore throat, mouth sores, hoarseness. Resp:  cough, sputum, hemoptysis; SOB, tightness, wheezing. Cardio:  CP, palpit, DOE, orthopnea, edema. GI:  N/V/D/C, blood in stool; reflux, abd pain, distention, gas. GU:  dysuria, freq, urgency, hematuria, flank pain, voiding difficulty. MS:  joint pain, swelling, tenderness, decr ROM; neck pain, back pain, etc. Neuro:  HA, tremors, seizures, dizziness,  syncope, weakness, numbness, gait abn. Skin:  suspicious lesions or skin rash. Heme:  adenopathy, bruising, bleeding. Psyche:  confusion, agitation, sleep disturbance, hallucinations, anxiety, depression suicidal.   Objective:   Physical Exam       Vital Signs:  Reviewed...   General:  WD, WN, 84 y/o WF in NAD; alert & oriented; pleasant & cooperative... HEENT:  [place]/AT; Conjunctiva- pink, Sclera- nonicteric, EOM-wnl, PERRLA, EACs-clear, TMs-wnl; NOSE-clear; THROAT-clear & wnl.  Neck:  Supple w/ fair ROM; no JVD; normal carotid impulses w/o bruits; no thyromegaly or nodules palpated; no lymphadenopathy.  Chest:  Clear to P & A; without wheezes, rales, or rhonchi heard. Heart:  Regular Rhythm; Gr1/6 SEM, without rubs or gallops detected. Abdomen:  Soft & nontender- no guarding or rebound; normal bowel sounds; no organomegaly or masses palpated. Ext:  decrROM; without deformities +arthritic changes; no varicose veins, venous insuffic, or edema;  Pulses intact w/o bruits. Neuro:  CNs II-XII intact; motor testing normal; sensory testing normal; gait normal & balance OK. Derm:  No lesions noted; no rash etc. Lymph:  No cervical, supraclavicular, axillary, or inguinal adenopathy palpated.   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    Abnormal PFT w/ norm airflow, norm lung volumes, air trapping & decr DLCO=56%>  This is a baseline PFT study done 02/2015 & Amio was started 10/2014; DLCO also affected by lung vol & anemia...    CT Chest 10/2014 had some CHF w/ interstitial prominence, sm bilat effusions, but improved adenopathy & diminished pulm nodules s/o chemotherapy for lymphoma    HBP> on MetopER25Bid, Lasix40AM&20PM, K20    Hx PAF> on Eliquis5Bid, Amiodarone200; she is holding NSR on these meds...    Chr diastolic CHF> on meds  above; last BNP was 11/2014= 378...    Hyperlipidemia> on Prav80 + Fish Oil    Hypothyroid> on Synthroid100    Osteopenia and T12 compression    Hx non-hodgkins lymphoma> dx 6/16 & treated w/ 4 cycles chemoRx (last 10/28/14)    Anemia> on FeBid since 6/16 & followed by DrSherrill; last CBC 03/11/15 showed Hg=10.6, MCV=81; will discuss w/ DrSherrill re: poss IV iron therapy & she definitely needs to get back on her B12 supplementation.    Hx B12 defic> she is not currently taking B12 shots or supplements & B12 level 04/2015 = 210; rec to restart her B12 shots 1073mg/mo (admit by her RN daughter)...   PLAN >>   05/10/15>   SNarcissaindicates that her DOE is improving, getting better "It helps me to walk" she notes;  We are indered by the timing of her studies- starting on Amio 10/2014, last CTChest 10/2014, 1st PFT 02/2015, her chronic diastolic CHF, mild anemia, etc... She will continue same meds, continue to exercise, f/u w/ Oncology & we will recheck pt in 6 weeks...  06/29/15>   She is rec to continue current meds; restart B12 shots monthly per her daughter who is an RTherapist, sports refer to ENT for Bx/ excision of left lower lip lesion. 10/04/15>   Note- pt has O2 per DrSherrill & states "I use it at night when I need it for SOB"; OK 2017 Flu vaccine today; we reviewed low carb diet & incr exercise program... we plan rov recheck 614mo/19/18>   We discussed how this dyspnea is diff than her prev SOB w/ AFib & CHF- esp how she feels SOB w/ ADLs (can't get a deep breath "IN") but able to work-out on her exercise bike Bid w/o difficulty;  In this regard I have rec a trial of KLONOPIN 0.'5mg'$  1/2 to 1 tab po Bid to relax her chest wall muscles and allow a deeper more satisfying breath; we plan rov recheck to document response in [id number]/16/18>   Klonopin is really helping her dyspnea; today c/o right knee pain & we'll try Tramadol + refer to Ortho... 08/07/16>   Amanda Sellers stable, both clinically & by PFTs, rec to contiunue  same meds and follow up [id number]/19>   ShirTamberstable overall- she has not required breathing meds other than KLONOPIN 0.'5mg'$  - 1/2 tab bid for anxiety & this helps her breathing; requests TRAMADOL for leg discomfort..   Plan:     Patient's Medications  New Prescriptions   No medications on file  Previous Medications   ALPRAZOLAM (XANAX) 0.5 MG TABLET    1 tab po qhs prn anxiety-related insomnia   AMIODARONE (PACERONE) 200 MG TABLET    Take 0.5 tablets (100 mg total) by mouth  daily.   APIXABAN (ELIQUIS) 5 MG TABS TABLET    Take 1 tablet (5 mg total) by mouth 2 (two) times daily.   CLONAZEPAM (KLONOPIN) 0.5 MG TABLET    Take 1/2-1 tab by mouth two times daily as needed for shortness of breath   CYANOCOBALAMIN (,VITAMIN B-12,) 1000 MCG/ML INJECTION    INJECT 1 ML (1000 MCG TOTAL) INTO THE SKIN EVERY 30 DAYS.   FERROUS SULFATE 325 (65 FE) MG TABLET    Take 1 tablet (325 mg total) by mouth 2 (two) times daily with a meal.   FISH OIL-OMEGA-3 FATTY ACIDS 1000 MG CAPSULE    Take 2 g by mouth daily.   FUROSEMIDE (LASIX) 40 MG TABLET    Take 1 tablet (40 mg total) by mouth 2 (two) times daily.   LEVOTHYROXINE (SYNTHROID, LEVOTHROID) 100 MCG TABLET    TAKE ONE TABLET BY MOUTH EVERY DAY BEFORE BREAKFAST.   METOPROLOL SUCCINATE (TOPROL-XL) 25 MG 24 HR TABLET    Take 0.5 tablets (12.5 mg total) by mouth daily.   OXYGEN    Inhale into the lungs. 1Lt at bedtime   POTASSIUM CHLORIDE 20 MEQ TBCR    Take 20 mEq by mouth daily.   PRAVASTATIN (PRAVACHOL) 80 MG TABLET    TAKE ONE TABLET BY MOUTH EVERY DAY   VITAMIN D, ERGOCALCIFEROL, (DRISDOL) 50000 UNITS CAPS CAPSULE    Take 1 capsule (50,000 Units total) by mouth 2 (two) times a week.  Modified Medications   Modified Medication Previous Medication   TRAMADOL (ULTRAM) 50 MG TABLET traMADol (ULTRAM) 50 MG tablet      Take 1 tablet (50 mg total) by mouth 2 (two) times daily. For arthritis pain    Take 1 tablet (50 mg total) by mouth 2 (two) times daily. For  arthritis pain  Discontinued Medications   AMIODARONE (PACERONE) 200 MG TABLET    TAKE ONE TABLET BY MOUTH EVERY DAY   METOPROLOL SUCCINATE (TOPROL-XL) 25 MG 24 HR TABLET    Take 0.5 tablets (12.5 mg total) by mouth daily.

## 2017-08-06 NOTE — Patient Instructions (Signed)
Today we updated your med list in our EPIC system...    Continue your current medications the same...  Continue the KLONOPIN 0.5mg  tabs at 1/2 to 1 tab twice daily..  We refilled your TRAMADOL 50mg  tabs today - take one tab twice daily as needed for your arthritic pain...  Call for any questions or if I can be of service in any way.Marland KitchenMarland Kitchen

## 2017-08-08 ENCOUNTER — Ambulatory Visit: Payer: PPO | Admitting: Pulmonary Disease

## 2017-08-10 DIAGNOSIS — I10 Essential (primary) hypertension: Secondary | ICD-10-CM | POA: Diagnosis not present

## 2017-08-10 DIAGNOSIS — J9600 Acute respiratory failure, unspecified whether with hypoxia or hypercapnia: Secondary | ICD-10-CM | POA: Diagnosis not present

## 2017-08-15 ENCOUNTER — Ambulatory Visit (HOSPITAL_COMMUNITY)
Admission: RE | Admit: 2017-08-15 | Discharge: 2017-08-15 | Disposition: A | Discharge status: Home / Self Care | Payer: PPO | Source: Ambulatory Visit | Attending: Nurse Practitioner | Admitting: Nurse Practitioner

## 2017-08-15 ENCOUNTER — Encounter (HOSPITAL_COMMUNITY): Payer: Self-pay | Admitting: Nurse Practitioner

## 2017-08-15 VITALS — BP 168/76 | HR 56 | Ht 64.0 in | Wt 200.0 lb

## 2017-08-15 DIAGNOSIS — Z79899 Other long term (current) drug therapy: Secondary | ICD-10-CM | POA: Diagnosis not present

## 2017-08-15 DIAGNOSIS — Z9221 Personal history of antineoplastic chemotherapy: Secondary | ICD-10-CM | POA: Insufficient documentation

## 2017-08-15 DIAGNOSIS — I481 Persistent atrial fibrillation: Secondary | ICD-10-CM | POA: Insufficient documentation

## 2017-08-15 DIAGNOSIS — F419 Anxiety disorder, unspecified: Secondary | ICD-10-CM | POA: Diagnosis not present

## 2017-08-15 DIAGNOSIS — I130 Hypertensive heart and chronic kidney disease with heart failure and stage 1 through stage 4 chronic kidney disease, or unspecified chronic kidney disease: Secondary | ICD-10-CM | POA: Diagnosis not present

## 2017-08-15 DIAGNOSIS — Z7901 Long term (current) use of anticoagulants: Secondary | ICD-10-CM | POA: Insufficient documentation

## 2017-08-15 DIAGNOSIS — I5043 Acute on chronic combined systolic (congestive) and diastolic (congestive) heart failure: Secondary | ICD-10-CM | POA: Insufficient documentation

## 2017-08-15 DIAGNOSIS — N183 Chronic kidney disease, stage 3 (moderate): Secondary | ICD-10-CM | POA: Insufficient documentation

## 2017-08-15 DIAGNOSIS — E559 Vitamin D deficiency, unspecified: Secondary | ICD-10-CM | POA: Diagnosis not present

## 2017-08-15 DIAGNOSIS — I480 Paroxysmal atrial fibrillation: Secondary | ICD-10-CM | POA: Diagnosis not present

## 2017-08-15 DIAGNOSIS — Z8572 Personal history of non-Hodgkin lymphomas: Secondary | ICD-10-CM | POA: Insufficient documentation

## 2017-08-15 DIAGNOSIS — J449 Chronic obstructive pulmonary disease, unspecified: Secondary | ICD-10-CM | POA: Insufficient documentation

## 2017-08-15 DIAGNOSIS — E785 Hyperlipidemia, unspecified: Secondary | ICD-10-CM | POA: Diagnosis not present

## 2017-08-15 DIAGNOSIS — R0609 Other forms of dyspnea: Secondary | ICD-10-CM | POA: Insufficient documentation

## 2017-08-15 DIAGNOSIS — E039 Hypothyroidism, unspecified: Secondary | ICD-10-CM | POA: Insufficient documentation

## 2017-08-15 DIAGNOSIS — E669 Obesity, unspecified: Secondary | ICD-10-CM | POA: Insufficient documentation

## 2017-08-15 DIAGNOSIS — D509 Iron deficiency anemia, unspecified: Secondary | ICD-10-CM | POA: Insufficient documentation

## 2017-08-15 DIAGNOSIS — Z79890 Hormone replacement therapy (postmenopausal): Secondary | ICD-10-CM | POA: Diagnosis not present

## 2017-08-15 DIAGNOSIS — K449 Diaphragmatic hernia without obstruction or gangrene: Secondary | ICD-10-CM | POA: Diagnosis not present

## 2017-08-15 MED ORDER — APIXABAN 5 MG PO TABS: 5.0000 mg | ORAL_TABLET | Freq: Two times a day (BID) | ORAL | 2 refills | Status: DC

## 2017-08-15 NOTE — Addendum Note (Signed)
Encounter addended by: Sherran Needs, NP on: 08/15/2017 12:53 PM  Actions taken: Sign clinical note

## 2017-08-15 NOTE — Progress Notes (Addendum)
Patient ID: Amanda Sellers, female   DOB: 22-Sep-1932, 84 y.o.   MRN: [id number]           Primary Care Physician: Tammi Sou, MD Referring Physician: Dr. Ronni Rumble Amanda Sellers is a 84 y.o. female with a h/o  diastolic heart failure, afib, newly diagnosed with recent dx of lymphoma. She was admitted to [facility] 9/22 on IV dilt and given diuretics for acute on chronic systolic HF. It was arranged for to undergo TEE DCCV.  She had exertional dyspnea with PND & orthopnea.She felt very tired & had little energy. She  noted a few palpitations, but does not recall rapid irregular heart rates/rhythms.   She had TEE without thrombus and underwent DCCV that was successful with one shock of 120J. She did well post procedure and was seen by Dr. Jerilynn Mages. Croitoru and found stable for discharge. She was negative 2700cc since admit and weight was down 4 lbs with IV diuretics. D/C wt is 168.  Plan on d/c was for  uninterrupted anticoagulation for next 30 days, preferably lifelong barring bleeding complications. AFib clinic follow up in 1-2 weeks. Outpatient [facility], non-urgent. ASA stopped.   She was seen in the afib clinic, 10/10 and found to be in afib with rvr. She went into afib short time after cardioversion. EKG showed afib at 130-140 bpm. She was  not on any drugs for rate control.  Is taking DOAC without fail. Continues with chemotherapy for lymphoma.   She was started on BB and  returned to clinic 10/12 with better v rates. 80's with sitting and 90-100's with activity. She does not have any PND/orthopnea although she has slept better in her recliner for months now.No lower extremity edema. Her weight is stable at home.  She has  finished chemotherapy for non- hodgkin's lymphoma and had f/u Pet scan 10/26,  and CA appears to be in remission, so per pt, no more chemo for right now. She has been on DOAC now x one month so will start attempts to restore SR. Discussed options  amiodarone or tikosyn and she would prefer amiodarone as she has two neighbors on amiodarone and they are both in rhythm and tolerating drug well. She is not interested in a hospital stay for drug loading. She reports rash with recent contrast dye. Amiodarone was started 200 mg bid, to then go down to 200 mg a day and then would discuss cardioversion.  She asked to be seen 11/2,  in the afib clinic for increased shortness of breath yesterday and last night. She has slept in the recliner for months but did not feel as well last night and slept poorly, feeling more short of breath and fullness in her chest.She has noticed increased pedal edema. She did not take her amiodarone last night or this am. Ekg shows rate controlled afib at 82 bpm, qtc at 464 ms.PO on RA is 98%. Weight is up 2 lbs from last visit, 6 lbs since 10/17. Lasix dose was decreased to 40 mg daily from 40 mg am and 20 mg pm when seen by Dr. Ellyn Hack 10/22.. Pt has noticed weight climbing for several days. She is fairly comfortable when quiet and sitting.   She was asked to double lasix to 80 mg  x 3 days, and to reduce amiodarone 200 mg a day. She had lost 3 pounds on return to clinic several days later. Clinically, she is much improved with more energy and less shortness of  breath. Able to sleep well. Had enough energy to walk into clinic today without wheelchair and felt well enough to clean her house yesterday. Ideally,I would like to load amiodarone a while longer but will go ahead and  pursue DCCV sooner than later, as it is contributing to her heart failure. Compliant with blood thinner without missed doses On recent labs, TSH was elevated but daughter explained that she just started taking replacement correctly on an empty stomach, and sees PCP 11/16 and she expects him to adjust the dose.  11/14-  Returns to afib clinic with a smile on her face and reporting all the activities she has been doing over the last week, having returned to  sinus rhythm after cardioversion. Much more energy and breathing is much improved.  Fluid status is stable.  12/13- Returns to afib clinic for f/u and reports doing well No afib, no further shortness of breath or edema. She is back to her usual activities. Got a good report form the Ca unit recently. Tolerating amiodarone 200 mg a day.  Return to afib clinic 4/18, reports that she is staying in [place]. Saw pulmonology yesterday for some c/o of shortness of breath but breathing much improved since staying in SR. Xray no active process, thyroid, liver studies drawn yesterday, and normal. MD said he would call hr after results of tests were reviewed. Fluid status normal.  F/u in afib clinic 01/31/16, pt continues to do well without any afib. Her fluid status is normal. Last TSH, 3 months ago, showed mild increase and will check TSH and liver panel today. Her breathing is good other than the last week in the bitter cold weather  she does not breath as good outside.No bleeding issues with eliquis. Fluid status is stable on lasix.  F/u in afib 01/26/17  for shortness of breath. Pt has been having shortness of breath for some while but contributed it to her knees and difficultly in ambulating. She was seen by pulmonology in July with improved PFT's and was OK to continue amiodarone. She was seen in November by Dr. Ellyn Hack and shortness of breath was thought to be stable/chronic. She feels that her shortness of breath has worsened since Christmas.  If she walks to the bathroom and back she is entirely out of breath. Sleeps on 2 pillows and has not felt the need to increase to elevate her head more at night. Ekg today shows junctional rhythm at 46 bpm. She is breathless speaking and started walking to clinic room but had to stop and get w/c. PO 99%.  F/u in afib clinic 1/7, after reducing amiodarone to 1/2 tab a day as well BB to 1/2 tab a day for brady as well as increase in shortness of breath. She is feeling much better  today to the point she was able to walk to clinic and not use w/c. She is not breathless talking.  F/u in afib clinic, 1/23, She feels great! Saw her pulmonologist recently and she said he was very pleased with her lung status. She is walking with a walker and observed coming into the clinic, no exertional dyspnea noted.  Her  HR is Sinus in the 40's today, but she is not symptomatic. She checks her HR with a pulse ox several times a day and it usually runs around 53-60.   F/u in afib clinic, 08/15/17. She is feeling well. Just saw Dr. Lenna Gilford and was given a good report. Ekg, SR with HR's in the 50's. 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 She has some chromic LE, which was worse after recent knee injections.. She did increase lasix for a couple of days and now back to baseline now.  Today, she denies symptoms of palpitations, chest pain,  orthopnea, PND, mild chronic lower extremity edema, no dizziness, presyncope, syncope, or neurologic sequela.  The patient is tolerating medications without difficulties and is otherwise without complaint today.   Past Medical History:  Diagnosis Date  . Anemia of chronic disease   . Anticoagulation adequate, Eliquis 10/16/2014  . Anxiety    clonaz helps this AND her breathing  . Chronic diastolic congestive heart failure ([place]) 35/32/9924   Complicated by atrial fibrillation; Echo 07/23/14: mild LVH, EF 50-55%, grade 2 diast dysfxn.  NYHA class II.  Marland Kitchen Chronic renal insufficiency, stage III (moderate) (HCC)    GFR @ 50  . COPD (chronic obstructive pulmonary disease) ([place])    recently noted on Xray, does not have any problems  . Family history of adverse reaction to anesthesia    daughter has difficulty waking up  . Follicular lymphoma ([place])    Non Hodgkins B cell lymphoma; s/p chemo summer 2016--in remission as of 03/2016.  Marland Kitchen HLD (hyperlipidemia)     06/2014  . HTN (hypertension)   . Hypothyroidism   . Lip cancer    Dr. Janace Hoard excised  this: invasive SCC--no sign of cancer at ENT f/u 10/2015  . Microcytic anemia    transfused 3 U total in hosp 07/2014  . Obesity (BMI 30-39.9)   . Olecranon bursitis of right elbow 02/2015  . Pancytopenia due to antineoplastic chemotherapy ([place])   . Persistent atrial fibrillation (HCC)    With RVR; elec cardioversion 11/30/14.  On Amio since 10/2014; Dr. Lenna Gilford following her from pulm standpoint regarding this med.  Marland Kitchen Positive occult stool blood test 08/08/14   Endoscopies ok 08/2014  . Primary osteoarthritis of both knees 04/2016   Dr. Gladstone Lighter: bone on bone--to get euflexxa to both knees.  . Pulmonary metastases ([place]) 07/24/2014  . Shortness of breath dyspnea    with exertion.  Dr. Lenna Gilford started her on clonazepam to try to relax her chest wall and allow more satisfying deep breath. (03/2016).  . Vitamin B 12 deficiency   . Vitamin D deficiency    dose increased to 50 K U tab TWICE weekly 04/2017   Past Surgical History:  Procedure Laterality Date  . ABDOMINAL HYSTERECTOMY  1978  . BONE MARROW BIOPSY  07/24/14  . BREAST BIOPSY    . CARDIOVERSION N/A 10/16/2014   Procedure: CARDIOVERSION;  Surgeon: Lelon Perla, MD;  Location[facility] ENDOSCOPY;  Service: Cardiovascular;  Laterality: N/A;  . CARDIOVERSION N/A 11/30/2014   Procedure: CARDIOVERSION;  Surgeon: Larey Dresser, MD;  Location: [facility] ENDOSCOPY;  Service: Cardiovascular;  Laterality: N/A;  . COLONOSCOPY  09/23/14   small hemorrhoids, otherwise normal (performed for IDA and heme+ stool)  . COLONOSCOPY    . MASS EXCISION Left 07/15/2015   Left lower lip mass--invasive SCC w/negative margins.  Procedure: EXCISION MASS;  Surgeon: Melissa Montane, MD;  Location: [place];  Service: ENT;  Laterality: Left;  Wedge excision left lower lip mass  . NM MYOVIEW LTD  10/29/2014   medium size mild surgery defect in the mid anterior and apical anterior location suggestive of breast attenuation. No reversibility. LOW RISK  . PFTs  02/2015   Restriction with  diffusion defect: cardiologist referred her  to pulm to help interpret PFTs and decide whether she has amiodarone toxicity  . TEE WITHOUT CARDIOVERSION N/A 10/16/2014   Procedure: TRANSESOPHAGEAL ECHOCARDIOGRAM (TEE);  Surgeon: Lelon Perla, MD;  Location: [facility] ENDOSCOPY;  Service: Cardiovascular;  Laterality: N/A;  . TRANSTHORACIC ECHOCARDIOGRAM  07/23/14   mild LVH, EF 50-55%, grade 2 diast dysfxn  . UPPER GI ENDOSCOPY  09/23/2014   small hiatus hernia, nodules in stomach biopsied (chronic active erosive atrophic gastritis with intestinal metaplasia--no dysplasia or malignancy) otherwise normal    Current Outpatient Medications  Medication Sig Dispense Refill  . ALPRAZolam (XANAX) 0.5 MG tablet 1 tab po qhs prn anxiety-related insomnia 30 tablet 0  . amiodarone (PACERONE) 200 MG tablet Take 0.5 tablets (100 mg total) by mouth daily. 90 tablet 3  . apixaban (ELIQUIS) 5 MG TABS tablet Take 1 tablet (5 mg total) by mouth 2 (two) times daily. 180 tablet 2  . clonazePAM (KLONOPIN) 0.5 MG tablet Take 1/2-1 tab by mouth two times daily as needed for shortness of breath 60 tablet 5  . cyanocobalamin (,VITAMIN B-12,) 1000 MCG/ML injection INJECT 1 ML (1000 MCG TOTAL) INTO THE SKIN EVERY 30 DAYS. 1 mL 12  . ferrous sulfate 325 (65 FE) MG tablet Take 1 tablet (325 mg total) by mouth 2 (two) times daily with a meal. 30 tablet 1  . fish oil-omega-3 fatty acids 1000 MG capsule Take 2 g by mouth daily.    . furosemide (LASIX) 40 MG tablet Take 1 tablet (40 mg total) by mouth 2 (two) times daily. 60 tablet 10  . levothyroxine (SYNTHROID, LEVOTHROID) 100 MCG tablet TAKE ONE TABLET BY MOUTH EVERY DAY BEFORE BREAKFAST. 90 tablet 1  . metoprolol succinate (TOPROL-XL) 25 MG 24 hr tablet Take 0.5 tablets (12.5 mg total) by mouth daily. 180 tablet 3  . OXYGEN Inhale into the lungs. 1Lt at bedtime    . potassium chloride 20 MEQ TBCR Take 20 mEq by mouth daily. 30 tablet 3  . pravastatin (PRAVACHOL) 80 MG tablet TAKE  ONE TABLET BY MOUTH EVERY DAY 90 tablet 3  . traMADol (ULTRAM) 50 MG tablet Take 1 tablet (50 mg total) by mouth 2 (two) times daily. For arthritis pain 60 tablet 0  . Vitamin D, Ergocalciferol, (DRISDOL) 50000 units CAPS capsule Take 1 capsule (50,000 Units total) by mouth 2 (two) times a week. 24 capsule 3   No current facility-administered medications for this encounter.     Allergies  Allergen Reactions  . Iodinated Diagnostic Agents Rash and Other (See Comments)    CT Contrast    Social History   Socioeconomic History  . Marital status: Married    Spouse name: Not on file  . Number of children: 8  . Years of education: Not on file  . Highest education level: Not on file  Occupational History  . Occupation: retired  Scientific laboratory technician  . Financial resource strain: Not on file  . Food insecurity:    Worry: Not on file    Inability: Not on file  . Transportation needs:    Medical: Not on file    Non-medical: Not on file  Tobacco Use  . Smoking status: Never Smoker  . Smokeless tobacco: Never Used  Substance and Sexual Activity  . Alcohol use: No    Alcohol/week: 0.0 oz  . Drug use: No  . Sexual activity: Not on file  Lifestyle  . Physical activity:    Days per week: Not on file  Minutes per session: Not on file  . Stress: Not on file  Relationships  . Social connections:    Talks on phone: Not on file    Gets together: Not on file    Attends religious service: Not on file    Active member of club or organization: Not on file    Attends meetings of clubs or organizations: Not on file    Relationship status: Not on file  . Intimate partner violence:    Fear of current or ex partner: Not on file    Emotionally abused: Not on file    Physically abused: Not on file    Forced sexual activity: Not on file  Other Topics Concern  . Not on file  Social History Narrative   Widowed, has 8 children.   Orig from [place], now lives in [place].   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   No tob/alc/drugs.    Family History  Problem Relation Age of Onset  . Melanoma Mother   . Stomach cancer Father   . Lymphoma Sister   . Diabetes Daughter     ROS- All systems are reviewed and negative except as per the HPI above  Physical Exam: Vitals:   08/15/17 1030  BP: (!) 168/76  Pulse: (!) 56  Weight: 200 lb (90.7 kg)  Height: '5\' 4"'$  (1.626 m)    GEN- The patient is well appearing, alert and oriented x 3 today.   Head- normocephalic, atraumatic Eyes-  Sclera clear, conjunctiva pink Ears- hearing intact Oropharynx- clear Neck- supple, no JVP Lymph- no cervical lymphadenopathy Lungs- Clear to ausculation bilaterally, normal work of breathing Heart-  regular rate and rhythm, no murmurs, rubs or gallops, PMI not laterally displaced GI- soft, NT, ND, + BS Extremities- no clubbing, cyanosis,  Trace edema MS- no significant deformity or atrophy Skin- no rash or lesion Psych- euthymic mood, full affect Neuro- strength and sensation are intact  EKG- Sinus brady at at 56 bpm, pr int 184 ms, qrs int 92 ms, qtc 515 ms Epic records reviewed. Labs reviewed CXR-FINDINGS:01/26/17 Stable cardiomediastinal silhouette. No pneumothorax or pleural effusion is noted. No acute pulmonary disease is noted. Stable moderate size hiatal hernia. Stable old lower thoracic compression fracture.  IMPRESSION: No active cardiopulmonary disease. Stable moderate size hiatal hernia.   Assessment and Plan: 1. Persistent symptomatic afib Maintaining SR  HR is  improved in the 50's with less BB/amio, had HR's in the 40's associated with fatigue and increased exertional dyspnea before drugs were reduced in dose Pt reports HR at home checked several times a day in the 50-60 range and she feels very well now, no dizziness or shortness of breath She will continue to monitor HR at home  Continue 100 mg amiodarone day as well as 12.5 mg  metoprolol ER daily Continue apixaban for chadsvasc score of at least 4   2.Dyspnea Improved with less amio/BB Pr Dr. Lenna Gilford   3. Non Hodgkns B cell lymphoma S/p chemotherapy  4. Diastolic heart failure Continue Lasix 40 mg bid Fluid status stable  F/u here in 6 months  F/u with Pulmonology/ Dr. Ellyn Hack as scheduled  Geroge Baseman. Lilith Solana, [facility] [address] [place], [place] [address] [phone]

## 2017-09-03 DIAGNOSIS — M1711 Unilateral primary osteoarthritis, right knee: Secondary | ICD-10-CM | POA: Diagnosis not present

## 2017-09-03 DIAGNOSIS — M1712 Unilateral primary osteoarthritis, left knee: Secondary | ICD-10-CM | POA: Diagnosis not present

## 2017-09-10 DIAGNOSIS — J9600 Acute respiratory failure, unspecified whether with hypoxia or hypercapnia: Secondary | ICD-10-CM | POA: Diagnosis not present

## 2017-09-10 DIAGNOSIS — I10 Essential (primary) hypertension: Secondary | ICD-10-CM | POA: Diagnosis not present

## 2017-09-12 ENCOUNTER — Other Ambulatory Visit: Payer: Self-pay | Admitting: Pulmonary Disease

## 2017-10-05 ENCOUNTER — Encounter: Payer: Self-pay | Admitting: Oncology

## 2017-10-05 ENCOUNTER — Inpatient Hospital Stay: Payer: PPO

## 2017-10-05 ENCOUNTER — Inpatient Hospital Stay: Payer: PPO | Attending: Oncology | Admitting: Oncology

## 2017-10-05 ENCOUNTER — Telehealth: Payer: Self-pay | Admitting: Oncology

## 2017-10-05 VITALS — BP 146/72 | HR 62 | Temp 97.6°F | Resp 17 | Ht 64.0 in | Wt 198.5 lb

## 2017-10-05 DIAGNOSIS — D696 Thrombocytopenia, unspecified: Secondary | ICD-10-CM

## 2017-10-05 DIAGNOSIS — C8200 Follicular lymphoma grade I, unspecified site: Secondary | ICD-10-CM | POA: Diagnosis not present

## 2017-10-05 DIAGNOSIS — D6489 Other specified anemias: Secondary | ICD-10-CM

## 2017-10-05 DIAGNOSIS — Z23 Encounter for immunization: Secondary | ICD-10-CM

## 2017-10-05 LAB — CBC WITH DIFFERENTIAL (CANCER CENTER ONLY)
Basophils Absolute: 0 10*3/uL (ref 0.0–0.1)
Basophils Relative: 1 %
Eosinophils Absolute: 0.1 10*3/uL (ref 0.0–0.5)
Eosinophils Relative: 2 %
HCT: 37.4 % (ref 34.8–46.6)
Hemoglobin: 11.9 g/dL (ref 11.6–15.9)
Lymphocytes Relative: 32 %
Lymphs Abs: 1.1 10*3/uL (ref 0.9–3.3)
MCH: 28 pg (ref 25.1–34.0)
MCHC: 31.8 g/dL (ref 31.5–36.0)
MCV: 88 fL (ref 79.5–101.0)
Monocytes Absolute: 0.4 10*3/uL (ref 0.1–0.9)
Monocytes Relative: 11 %
Neutro Abs: 1.9 10*3/uL (ref 1.5–6.5)
Neutrophils Relative %: 54 %
Platelet Count: 120 10*3/uL — ABNORMAL LOW (ref 145–400)
RBC: 4.25 MIL/uL (ref 3.70–5.45)
RDW: 15 % — ABNORMAL HIGH (ref 11.2–14.5)
WBC Count: 3.4 10*3/uL — ABNORMAL LOW (ref 3.9–10.3)

## 2017-10-05 MED ORDER — INFLUENZA VAC SPLIT QUAD 0.5 ML IM SUSY
PREFILLED_SYRINGE | INTRAMUSCULAR | Status: AC
  Filled 2017-10-05: qty 0.5

## 2017-10-05 MED ORDER — INFLUENZA VAC SPLIT QUAD 0.5 ML IM SUSY
0.5000 mL | PREFILLED_SYRINGE | Freq: Once | INTRAMUSCULAR | Status: AC
  Administered 2017-10-05: 0.5 mL via INTRAMUSCULAR

## 2017-10-05 NOTE — Telephone Encounter (Signed)
Scheduled appt per 9/13 los - gave patient AVS and calender per los.  

## 2017-10-05 NOTE — Progress Notes (Signed)
  [facility] OFFICE PROGRESS NOTE   Diagnosis: Non-Hodgkin's lymphoma  INTERVAL HISTORY:   Ms. Lybeck returns as scheduled.  She feels well.  No fever, night sweats, or palpable lymph nodes.  She is working.  Objective:  Vital signs in last 24 hours:  Blood pressure (!) 146/72, pulse 62, temperature 97.6 F (36.4 C), temperature source Oral, resp. rate 17, height '5\' 4"'$  (1.626 m), weight 198 lb 8 oz (90 kg), SpO2 96 %.    HEENT: Neck without mass Lymphatics: No cervical, supraclavicular, axillary, or inguinal nodes Resp: Lungs clear bilaterally Cardio: Regular rate and rhythm GI: No hepatosplenomegaly, no mass, nontender Vascular: No leg edema   Lab Results:  Lab Results  Component Value Date   WBC 3.4 (L) 10/05/2017   HGB 11.9 10/05/2017   HCT 37.4 10/05/2017   MCV 88.0 10/05/2017   PLT 120 (L) 10/05/2017   NEUTROABS 1.9 10/05/2017    CMP  Lab Results  Component Value Date   NA 143 05/07/2017   K 4.3 05/07/2017   CL 101 05/07/2017   CO2 33 (H) 05/07/2017   GLUCOSE 102 (H) 05/07/2017   BUN 14 05/07/2017   CREATININE 0.86 05/07/2017   CALCIUM 9.0 05/07/2017   PROT 6.5 05/07/2017   ALBUMIN 4.2 05/07/2017   AST 15 05/07/2017   ALT 11 05/07/2017   ALKPHOS 56 05/07/2017   BILITOT 0.5 05/07/2017   GFRNONAA 44 (L) 01/31/2016   GFRAA 51 (L) 01/31/2016     Medications: I have reviewed the patient's current medications.   Assessment/Plan: 1.Non-Hodgkin's Lymphoma-bone marrow biopsy 07/24/2014 consistent with involvement by follicular B-cell lymphoma, CD20 positive   CTs of the chest 07/22/2014 and CT of the abdomen and pelvis on 07/23/2014-pulmonary nodules, hilar/mediastinal/supraclavicular/axillary adenopathy, splenomegaly, abdominal adenopathy, bilateral adrenal nodules   Cycle 1 bendamustine/rituximab 08/04/2014  Cycle 2 bendamustine/Rituxan 09/01/2014  Cycle 3 bendamustine/Rituxan 09/30/2014  Cycle 4 bendamustine/rituximab  10/27/2014  Restaging CT scans 11/17/2014 with significant improvement in diffuse lymphadenopathy, lung nodules, and renal lesions 2. Severe microcytic anemia, status post transfusion with packed red blood cells 07/23/2014   Normal ferritin, low transferrin saturation, "scant" bone marrow iron stores, review of peripheral blood smear consistent with iron deficiency anemia   colonoscopy and upper endoscopy 09/23/2014 3. Exertional dyspnea /orthopnea-most likely secondary to congestive heart failure  4. History of B-12 deficiency  5. Rash over the trunk and extremities 08/18/2014. Appears consistent with a drug rash. Resolved 08/28/2014.  Rash 11/18/2014, likely secondary to a contrast allergy, diagnostic contrast will be listed as an allergy 6. Colonoscopy 06/03/2008. Medium sized internal hemorrhoids.  7. Admission with atrial flutter with a rapid ventricular response 10/15/2014-status post cardioversion 10/16/2014, maintained on  apixaban  Repeat cardioversion 11/30/2014  8. Right olecranon skin lesion-likely a lipoma or cyst 9. 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 Lucarelli remains in clinical remission from non-Hodgkin's lymphoma.  The hemoglobin is improved.  She has mild thrombocytopenia today.  She will contact us for bleeding.  She will return for a CBC in 3 months and an office visit in 6 months.  She received an influenza vaccine today.  15 minutes were spent with the patient today.  The majority of the time was used for counseling and coordination of care.  [name], MD  10/05/2017  8:45 AM

## 2017-10-07 ENCOUNTER — Encounter: Payer: Self-pay | Admitting: Family Medicine

## 2017-10-11 DIAGNOSIS — I10 Essential (primary) hypertension: Secondary | ICD-10-CM | POA: Diagnosis not present

## 2017-10-11 DIAGNOSIS — J9600 Acute respiratory failure, unspecified whether with hypoxia or hypercapnia: Secondary | ICD-10-CM | POA: Diagnosis not present

## 2017-10-24 ENCOUNTER — Encounter: Payer: Self-pay | Admitting: Cardiology

## 2017-10-24 ENCOUNTER — Ambulatory Visit: Payer: PPO | Admitting: Cardiology

## 2017-10-24 VITALS — BP 188/80 | HR 80 | Ht 66.0 in | Wt 202.0 lb

## 2017-10-24 DIAGNOSIS — I10 Essential (primary) hypertension: Secondary | ICD-10-CM

## 2017-10-24 DIAGNOSIS — Z79899 Other long term (current) drug therapy: Secondary | ICD-10-CM

## 2017-10-24 DIAGNOSIS — Z7901 Long term (current) use of anticoagulants: Secondary | ICD-10-CM | POA: Diagnosis not present

## 2017-10-24 DIAGNOSIS — I480 Paroxysmal atrial fibrillation: Secondary | ICD-10-CM | POA: Diagnosis not present

## 2017-10-24 DIAGNOSIS — I5032 Chronic diastolic (congestive) heart failure: Secondary | ICD-10-CM

## 2017-10-24 MED ORDER — APIXABAN 5 MG PO TABS: 5.0000 mg | ORAL_TABLET | Freq: Two times a day (BID) | ORAL | 0 refills | Status: DC

## 2017-10-24 MED ORDER — VALSARTAN 40 MG PO TABS: 40.0000 mg | ORAL_TABLET | Freq: Every day | ORAL | 3 refills | Status: DC

## 2017-10-24 NOTE — Progress Notes (Signed)
PCP: Tammi Sou, MD  Clinic Note: Chief Complaint  Patient presents with  . Follow-up    No complaints  . Atrial Fibrillation    HPI: Amanda Sellers is a 84 y.o. female with a PMH below who presents today for Afib f/u & HTN / HTN Heart (~3 month Cards & 1 yr for me).  She is now maintaining NSR on amiodarone for rhythm control (after chemical cardioversion) and is onEliquis for anticoagulation. I last saw her in November 2018 (was in NSR).  She was doing quite well.  She voluntarily started using a walker because of somewhat unsteady gait and because her knees hurt her (being treated by orthopedic surgery).  She was worried about her legs giving out with walking.  Walker helped.  No dyspnea.  No sensation of recurrent A. fib no chest pain or pressure.  No bleeding on Eliquis.  No changes.  Lateya Dauria Lovins was last seen on August 15, 2017 by Orson Eva, NP in the atrial fibrillation clinic.  Both beta-blocker and amiodarone doses were reduced in half in January 2019, 1 because of bradycardia and shortness of breath. -->  Was doing well.  No changes.  Recent Hospitalizations: none  Studies Personally Reviewed - (if available, images/films reviewed: From Epic Chart or Care Everywhere)  No new studies  Interval History: [name] presents here today for follow-up doing well.  She is also mild today.  No real complaints of any significant symptoms.  No resting or exertional dyspnea.  She does use a walker, but more for balance and stability but moves quite well.  Not short of breath.  No PND orthopnea.  Minimal edema, she takes her Lasix every day but has not had to use additional doses.  She does use nighttime oxygen and occasionally has orthopnea if she does not use it.  No PND.  Notably, no sensation of rapid irregular heartbeats or palpitations to suspect recurrence of A. fib.  No nausea or vomiting on amiodarone.  No headache or blurred vision.  No dizziness or  lightheadedness.  However, she does note that her blood pressures been higher of late.  No chest pain or shortness of breath with rest or exertion. No syncope/near syncope. No TIA/amaurosis fugax symptoms. No melena, hematochezia, hematuria, or epstaxis. No claudication.  ROS: A comprehensive was performed. Review of Systems  Constitutional: Negative for malaise/fatigue.  HENT: Negative for congestion.   Respiratory: Positive for shortness of breath (Pretty much at baseline.  Doing well). Negative for cough and wheezing.   Gastrointestinal: Negative for abdominal pain and heartburn.  Musculoskeletal: Positive for joint pain.  Neurological: Negative for dizziness, focal weakness and weakness.  Psychiatric/Behavioral: Negative.   All other systems reviewed and are negative.  I have reviewed and (if needed) personally updated the patient's problem list, medications, allergies, past medical and surgical history, social and family history.   Past Medical History:  Diagnosis Date  . Anemia of chronic disease   . Anticoagulation adequate, Eliquis 10/16/2014  . Anxiety    clonaz helps this AND her breathing  . Chronic diastolic congestive heart failure ([place]) 72/09/4707   Complicated by atrial fibrillation; Echo 07/23/14: mild LVH, EF 50-55%, grade 2 diast dysfxn.  NYHA class II.  Marland Kitchen Chronic renal insufficiency, stage III (moderate) (HCC)    GFR @ 50  . COPD (chronic obstructive pulmonary disease) ([place])    recently noted on Xray, does not have any problems  . Family history of adverse reaction to  anesthesia    daughter has difficulty waking up  . Follicular lymphoma ([place])    Non Hodgkins B cell lymphoma; s/p chemo summer 2016--in remission as of 09/2017.  Marland Kitchen HLD (hyperlipidemia)     06/2014  . HTN (hypertension)   . Hypothyroidism   . Lip cancer    Dr. Janace Hoard excised this: invasive SCC--no sign of cancer at ENT f/u 10/2015  . Microcytic anemia    transfused 3 U total in hosp 07/2014  .  Obesity (BMI 30-39.9)   . Olecranon bursitis of right elbow 02/2015  . Pancytopenia due to antineoplastic chemotherapy ([place])   . Persistent atrial fibrillation    With RVR; elec cardioversion 11/30/14.  On Amio since 10/2014; Dr. Lenna Gilford following her from pulm standpoint regarding this med.  Marland Kitchen Positive occult stool blood test 08/08/14   Endoscopies ok 08/2014  . Primary osteoarthritis of both knees 04/2016   Dr. Gladstone Lighter: bone on bone--to get euflexxa to both knees.  . Pulmonary metastases ([place]) 07/24/2014  . Shortness of breath dyspnea    with exertion.  Dr. Lenna Gilford started her on clonazepam to try to relax her chest wall and allow more satisfying deep breath. (03/2016).  . Vitamin B 12 deficiency   . Vitamin D deficiency    dose increased to 50 K U tab TWICE weekly 04/2017    Past Surgical History:  Procedure Laterality Date  . ABDOMINAL HYSTERECTOMY  1978  . BONE MARROW BIOPSY  07/24/14  . BREAST BIOPSY    . CARDIOVERSION N/A 10/16/2014   Procedure: CARDIOVERSION;  Surgeon: Lelon Perla, MD;  Location: [facility] ENDOSCOPY;  Service: Cardiovascular;  Laterality: N/A;  . CARDIOVERSION N/A 11/30/2014   Procedure: CARDIOVERSION;  Surgeon: Larey Dresser, MD;  Location[facility] ENDOSCOPY;  Service: Cardiovascular;  Laterality: N/A;  . COLONOSCOPY  09/23/14   small hemorrhoids, otherwise normal (performed for IDA and heme+ stool)  . COLONOSCOPY    . MASS EXCISION Left 07/15/2015   Left lower lip mass--invasive SCC w/negative margins.  Procedure: EXCISION MASS;  Surgeon: Melissa Montane, MD;  Location: [place];  Service: ENT;  Laterality: Left;  Wedge excision left lower lip mass  . NM MYOVIEW LTD  10/29/2014   medium size mild surgery defect in the mid anterior and apical anterior location suggestive of breast attenuation. No reversibility. LOW RISK  . PFTs  02/2015   Restriction with diffusion defect: cardiologist referred her to pulm to help interpret PFTs and decide whether she has amiodarone toxicity  . TEE  WITHOUT CARDIOVERSION N/A 10/16/2014   Procedure: TRANSESOPHAGEAL ECHOCARDIOGRAM (TEE);  Surgeon: Lelon Perla, MD;  Location: [facility] ENDOSCOPY;  Service: Cardiovascular;  Laterality: N/A;  . TRANSTHORACIC ECHOCARDIOGRAM  07/23/14   mild LVH, EF 50-55%, grade 2 diast dysfxn  . UPPER GI ENDOSCOPY  09/23/2014   small hiatus hernia, nodules in stomach biopsied (chronic active erosive atrophic gastritis with intestinal metaplasia--no dysplasia or malignancy) otherwise normal    Current Meds  Medication Sig  . ALPRAZolam (XANAX) 0.5 MG tablet 1 tab po qhs prn anxiety-related insomnia  . amiodarone (PACERONE) 200 MG tablet Take 0.5 tablets (100 mg total) by mouth daily.  Marland Kitchen apixaban (ELIQUIS) 5 MG TABS tablet Take 1 tablet (5 mg total) by mouth 2 (two) times daily.  . clonazePAM (KLONOPIN) 0.5 MG tablet Take 1/2-1 tab by mouth two times daily as needed for shortness of breath  . cyanocobalamin (,VITAMIN B-12,) 1000 MCG/ML injection INJECT 1 ML (1000 MCG TOTAL) INTO THE SKIN  EVERY 30 DAYS.  . ferrous sulfate 325 (65 FE) MG tablet Take 1 tablet (325 mg total) by mouth 2 (two) times daily with a meal.  . fish oil-omega-3 fatty acids 1000 MG capsule Take 2 g by mouth daily.  . furosemide (LASIX) 40 MG tablet Take 1 tablet (40 mg total) by mouth 2 (two) times daily.  Marland Kitchen levothyroxine (SYNTHROID, LEVOTHROID) 100 MCG tablet TAKE ONE TABLET BY MOUTH EVERY DAY BEFORE BREAKFAST.  Marland Kitchen OXYGEN Inhale into the lungs. 1Lt at bedtime  . potassium chloride 20 MEQ TBCR Take 20 mEq by mouth daily.  . pravastatin (PRAVACHOL) 80 MG tablet TAKE ONE TABLET BY MOUTH EVERY DAY  . traMADol (ULTRAM) 50 MG tablet TAKE ONE TABLET BY MOUTH TWICE DAILY AS NEEDED FOR PAIN  . Vitamin D, Ergocalciferol, (DRISDOL) 50000 units CAPS capsule Take 1 capsule (50,000 Units total) by mouth 2 (two) times a week.  . [DISCONTINUED] apixaban (ELIQUIS) 5 MG TABS tablet Take 1 tablet (5 mg total) by mouth 2 (two) times daily.  . [DISCONTINUED]  metoprolol succinate (TOPROL-XL) 25 MG 24 hr tablet Take 0.5 tablets (12.5 mg total) by mouth daily.    Allergies  Allergen Reactions  . Iodinated Diagnostic Agents Rash and Other (See Comments)    CT Contrast    Social History   Tobacco Use  . Smoking status: Never Smoker  . Smokeless tobacco: Never Used  Substance Use Topics  . Alcohol use: No    Alcohol/week: 0.0 standard drinks  . Drug use: No   Social History   Social History Narrative   Widowed, has 8 children.   Orig from [place], now lives in [place].   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   No tob/alc/drugs.    family history includes Diabetes in her daughter; Lymphoma in her sister; Melanoma in her mother; Stomach cancer in her father.  Wt Readings from Last 3 Encounters:  10/24/17 202 lb (91.6 kg)  10/05/17 198 lb 8 oz (90 kg)  08/15/17 200 lb (90.7 kg)    PHYSICAL EXAM BP (!) 188/80   Pulse 80   Ht _0  (1.676 m)   Wt 202 lb (91.6 kg)   BMI 32.60 kg/m  Physical Exam  Constitutional: She is oriented to person, place, and time. She appears well-developed and well-nourished. No distress.  Well-groomed.  Healthy-appearing.  Is using a rolling walker.  HENT:  Head: Normocephalic and atraumatic.  Neck: Normal range of motion. Neck supple. No hepatojugular reflux and no JVD present. Carotid bruit is not present.  Cardiovascular: Normal rate, regular rhythm, normal heart sounds and intact distal pulses.  No extrasystoles are present. PMI is not displaced. Exam reveals no gallop and no friction rub.  No murmur heard. Pulmonary/Chest: Effort normal and breath sounds normal. No respiratory distress. She has no wheezes. She has no rales.  Mild diffuse crackles.  Abdominal: Soft. Bowel sounds are normal. She exhibits no distension. There is no tenderness. There is no rebound.  Musculoskeletal: Normal range of motion. She exhibits edema (Trivial bilateral lower  extremity).  Neurological: She is alert and oriented to person, place, and time.  Psychiatric: She has a normal mood and affect. Her behavior is normal. Judgment and thought content normal.  Vitals reviewed.    Adult ECG Report Not checked  Other studies Reviewed: Additional studies/ records that were reviewed today include:  Recent Labs:   Lab Results  Component Value Date  CREATININE 0.86 05/07/2017   BUN 14 05/07/2017   NA 143 05/07/2017   K 4.3 05/07/2017   CL 101 05/07/2017   CO2 33 (H) 05/07/2017    ASSESSMENT / PLAN: Problem List Items Addressed This Visit    Anticoagulation adequate, Eliquis   Chronic diastolic CHF (congestive heart failure), NYHA class 2 (HCC) (Chronic)    Overall stable symptoms on stable dose of Lasix.  I told her that she can take her Lasix combined 80 mg daily as opposed to 40 twice daily.  This way she would avoid nocturia.  She can think she can also then take an additional 40 mg in the evening as needed.  Thankfully she has not needed it.  I am a little regular blood pressure is getting higher.  Plan will be to actually discontinue Toprol and start ARB: Valsartan at 80 mg daily for afterload reduction.      Relevant Medications   apixaban (ELIQUIS) 5 MG TABS tablet   valsartan (DIOVAN) 40 MG tablet   Essential hypertension (Chronic)    Pressures are high.  We are willing to try to tolerate mildly elevated blood pressures with permissive hypertension, but these are way too high. Plan: DC Toprol and add valsartan 40 mg daily -> may need to titrate to 80 mg and follow-up with PCP.  -Would like for her PCP to check chemistry panel when seen in follow-up on 14 October.      Relevant Medications   apixaban (ELIQUIS) 5 MG TABS tablet   valsartan (DIOVAN) 40 MG tablet   On amiodarone therapy (Chronic)    PFTs are monitored by pulmonology.  LFTs and TFTs monitored by PCP.  Needs annual eye exam.      Paroxysmal atrial fibrillation with RVR  ([place]): CHA2DS2-VASc Score 4 - Primary (Chronic)    Continues to maintain sinus rhythm on amiodarone 100 mg daily.  Heart rates have been much better with reduced amiodarone dose. Not sure how much benefit with getting low-dose Toprol.  Will DC Toprol and add afterload reduction with ARB.  No bleeding issues on Eliquis.      Relevant Medications   apixaban (ELIQUIS) 5 MG TABS tablet   valsartan (DIOVAN) 40 MG tablet     Current medicines are reviewed at length with the patient today.  (+/- concerns) none The following changes have been made:  None  Patient Instructions  MEDICATION INSTRUCTION  STOP METOPROLOL   START VALSARTAN 40 MG ONE TABLET DAILY    Your physician wants you to follow-up in [place] will receive a reminder letter in the mail two months in advance. If you don't receive a letter, please call our office to schedule the follow-up appointment.   If you need a refill on your cardiac medications before your next appointment, please call your pharmacy.     Studies Ordered:   No orders of the defined types were placed in this encounter.     Glenetta Hew, M.D., M.S. Interventional Cardiologist   Pager # [phone] Phone # [phone] [address]. [place], [place] [address]   Thank you for choosing [facility] at [facility]!!

## 2017-10-24 NOTE — Patient Instructions (Addendum)
MEDICATION INSTRUCTION  STOP METOPROLOL   START VALSARTAN 40 MG ONE TABLET DAILY    Your physician wants you to follow-up in [place] will receive a reminder letter in the mail two months in advance. If you don't receive a letter, please call our office to schedule the follow-up appointment.   If you need a refill on your cardiac medications before your next appointment, please call your pharmacy.

## 2017-10-26 ENCOUNTER — Encounter: Payer: Self-pay | Admitting: Cardiology

## 2017-10-26 NOTE — Assessment & Plan Note (Signed)
Overall stable symptoms on stable dose of Lasix.  I told her that she can take her Lasix combined 80 mg daily as opposed to 40 twice daily.  This way she would avoid nocturia.  She can think she can also then take an additional 40 mg in the evening as needed.  Thankfully she has not needed it.  I am a little regular blood pressure is getting higher.  Plan will be to actually discontinue Toprol and start ARB: Valsartan at 80 mg daily for afterload reduction.

## 2017-10-26 NOTE — Assessment & Plan Note (Addendum)
Pressures are high.  We are willing to try to tolerate mildly elevated blood pressures with permissive hypertension, but these are way too high. Plan: DC Toprol and add valsartan 40 mg daily -> may need to titrate to 80 mg and follow-up with PCP.  -Would like for her PCP to check chemistry panel when seen in follow-up on 14 October.

## 2017-10-26 NOTE — Assessment & Plan Note (Signed)
Continues to maintain sinus rhythm on amiodarone 100 mg daily.  Heart rates have been much better with reduced amiodarone dose. Not sure how much benefit with getting low-dose Toprol.  Will DC Toprol and add afterload reduction with ARB.  No bleeding issues on Eliquis.

## 2017-10-26 NOTE — Assessment & Plan Note (Signed)
PFTs are monitored by pulmonology.  LFTs and TFTs monitored by PCP.  Needs annual eye exam.

## 2017-11-05 ENCOUNTER — Ambulatory Visit: Payer: PPO | Admitting: Family Medicine

## 2017-11-05 ENCOUNTER — Encounter: Payer: Self-pay | Admitting: Family Medicine

## 2017-11-05 VITALS — BP 152/84 | HR 75 | Resp 16 | Ht 66.0 in | Wt 199.0 lb

## 2017-11-05 DIAGNOSIS — F419 Anxiety disorder, unspecified: Secondary | ICD-10-CM | POA: Diagnosis not present

## 2017-11-05 DIAGNOSIS — I10 Essential (primary) hypertension: Secondary | ICD-10-CM

## 2017-11-05 DIAGNOSIS — E039 Hypothyroidism, unspecified: Secondary | ICD-10-CM | POA: Diagnosis not present

## 2017-11-05 DIAGNOSIS — E7800 Pure hypercholesterolemia, unspecified: Secondary | ICD-10-CM | POA: Diagnosis not present

## 2017-11-05 DIAGNOSIS — E559 Vitamin D deficiency, unspecified: Secondary | ICD-10-CM | POA: Diagnosis not present

## 2017-11-05 LAB — BASIC METABOLIC PANEL
BUN: 21 mg/dL (ref 6–23)
CO2: 36 mEq/L — ABNORMAL HIGH (ref 19–32)
Calcium: 9.4 mg/dL (ref 8.4–10.5)
Chloride: 99 mEq/L (ref 96–112)
Creatinine, Ser: 1.01 mg/dL (ref 0.40–1.20)
GFR: 55.6 mL/min — ABNORMAL LOW (ref >60.00)
Glucose, Bld: 114 mg/dL — ABNORMAL HIGH (ref 70–99)
Potassium: 4.4 mEq/L (ref 3.5–5.1)
Sodium: 141 mEq/L (ref 135–145)

## 2017-11-05 LAB — TSH: TSH: 4.68 u[IU]/mL — ABNORMAL HIGH (ref 0.35–4.50)

## 2017-11-05 LAB — LIPID PANEL
Cholesterol: 220 mg/dL — ABNORMAL HIGH (ref 0–200)
HDL: 75.4 mg/dL (ref >39.00)
LDL Cholesterol: 104 mg/dL — ABNORMAL HIGH (ref 0–99)
NonHDL: 144.42
Total CHOL/HDL Ratio: 3
Triglycerides: 200 mg/dL — ABNORMAL HIGH (ref 0.0–149.0)
VLDL: 40 mg/dL (ref 0.0–40.0)

## 2017-11-05 LAB — VITAMIN D 25 HYDROXY (VIT D DEFICIENCY, FRACTURES): VITD: 57.96 ng/mL (ref 30.00–100.00)

## 2017-11-05 MED ORDER — VITAMIN D (ERGOCALCIFEROL) 1.25 MG (50000 UNIT) PO CAPS: 50000.0000 [IU] | ORAL_CAPSULE | ORAL | 3 refills | Status: DC

## 2017-11-05 NOTE — Progress Notes (Signed)
OFFICE VISIT  11/05/2017   CC:  Chief Complaint  Patient presents with  . Follow-up    RCI    HPI:    Patient is a 84 y.o. Caucasian female who presents for 6 mo f/u HTN, CRI III, hypothyroidism, vit D def.  HTN: bp at specialist f/u's have been 180-190s so Dr. Wilhemina Cash d/c'd her toprol and put her on valsartan 40 mg daily a couple weeks ago. Home bp's improving gradually; 140s the last 1 wk. She continues to take lasix 58m bid. Due for lytes/cr recheck today.  Taking an iron tab once a day.  Taking vit D twice a week.  Takes T4 as rx'd/correctly.  CRI: tries to pay attention to hydration and avoidance of NSAIDs.  ROS: no CP, no SOB, no wheezing, no cough, no dizziness, no HAs, no rashes, no melena/hematochezia.  No polyuria or polydipsia.  No myalgias or arthralgias.  Past Medical History:  Diagnosis Date  . Anemia of chronic disease   . Anticoagulation adequate, Eliquis 10/16/2014  . Anxiety    clonaz helps this AND her breathing  . Chronic diastolic congestive heart failure ([place] 010/25/8527  Complicated by atrial fibrillation; Echo 07/23/14: mild LVH, EF 50-55%, grade 2 diast dysfxn.  NYHA class II.  .Marland KitchenChronic renal insufficiency, stage III (moderate) (HCC)    GFR @ 50  . COPD (ch[place] obstructive pulmonary disease) ([place]    recently noted on Xray, does not have any problems  . Family history of adverse reaction to anesthesia    daughter has difficulty waking up  . [place]licular [place]    Non Hodgkins B cell lymphoma; s/p chemo summer 2016--in remission as of 09/2017.  .Marland KitchenHLD (hyperlipidemia)     06/2014  . HTN (hypertension)   . Hypothyroidism   . Lip cancer    Dr. BJanace Hoardexcised this: invasive SCC--no sign of cancer at ENT f/u 10/2015  . Microcytic anemia    transfused 3 U total in hosp 07/2014  . Obesity (BMI 30-39.9)   . Olecranon bursitis of right elbow 02/2015  . Pancytopenia due to antineoplastic chemotherapy ([place]   . Persistent atrial fibrillation     With RVR; elec cardioversion 11/30/14.  On Amio since 10/2014; Dr. NLenna Gilfordfollowing her from pulm standpoint regarding this med.  .Marland KitchenPositive occult stool blood test 08/08/14   Endoscopies ok 08/2014  . Primary osteoarthritis of both knees 04/2016   Dr. GGladstone Lighter bone on bone--to get euflexxa to both knees.  . Pulmonary metastases ([place] 07/24/2014  . Shortness of breath dyspnea    with exertion.  Dr. NLenna Gilfordstarted her on clonazepam to try to relax her chest wall and allow more satisfying deep breath. (03/2016).  . Vitamin B 12 deficiency   . Vitamin D deficiency    dose increased to 50 K U tab TWICE weekly 04/2017    Past Surgical History:  Procedure Laterality Date  . ABDOMINAL HYSTERECTOMY  1978  . BONE MARROW BIOPSY  07/24/14  . BREAST BIOPSY    . CARDIOVERSION N/A 10/16/2014   Procedure: CARDIOVERSION;  Surgeon: BLelon Perla MD;  [facility];  Service: Cardiovascular;  Laterality: N/A;  . CARDIOVERSION N/A 11/30/2014   Procedure: CARDIOVERSION;  Surgeon: DLarey Dresser MD;  [facility];  Service: Cardiovascular;  Laterality: N/A;  . COLONOSCOPY  09/23/14   small hemorrhoids, otherwise normal (performed for IDA and heme+ stool)  . COLONOSCOPY    . MASS EXCISION Left 07/15/2015   Left  lower lip mass--invasive SCC w/negative margins.  Procedure: EXCISION MASS;  Surgeon: Melissa Montane, MD;  Location: [place];  Service: ENT;  Laterality: Left;  Wedge excision left lower lip mass  . NM MYOVIEW LTD  10/29/2014   medium size mild surgery defect in the mid anterior and apical anterior location suggestive of breast attenuation. No reversibility. LOW RISK  . PFTs  02/2015   Restriction with diffusion defect: cardiologist referred her to pulm to help interpret PFTs and decide whether she has amiodarone toxicity  . TEE WITHOUT CARDIOVERSION N/A 10/16/2014   Procedure: TRANSESOPHAGEAL ECHOCARDIOGRAM (TEE);  Surgeon: Lelon Perla, MD;  Location: [facility] ENDOSCOPY;  Service:  Cardiovascular;  Laterality: N/A;  . TRANSTHORACIC ECHOCARDIOGRAM  07/23/14   mild LVH, EF 50-55%, grade 2 diast dysfxn  . UPPER GI ENDOSCOPY  09/23/2014   small hiatus hernia, nodules in stomach biopsied (chronic active erosive atrophic gastritis with intestinal metaplasia--no dysplasia or malignancy) otherwise normal    Outpatient Medications Prior to Visit  Medication Sig Dispense Refill  . ALPRAZolam (XANAX) 0.5 MG tablet 1 tab po qhs prn anxiety-related insomnia 30 tablet 0  . amiodarone (PACERONE) 200 MG tablet Take 0.5 tablets (100 mg total) by mouth daily. 90 tablet 3  . apixaban (ELIQUIS) 5 MG TABS tablet Take 1 tablet (5 mg total) by mouth 2 (two) times daily. 28 tablet 0  . clonazePAM (KLONOPIN) 0.5 MG tablet Take 1/2-1 tab by mouth two times daily as needed for shortness of breath 60 tablet 5  . cyanocobalamin (,VITAMIN B-12,) 1000 MCG/ML injection INJECT 1 ML (1000 MCG TOTAL) INTO THE SKIN EVERY 30 DAYS. 1 mL 12  . ferrous sulfate 325 (65 FE) MG tablet Take 1 tablet (325 mg total) by mouth 2 (two) times daily with a meal. 30 tablet 1  . fish oil-omega-3 fatty acids 1000 MG capsule Take 2 g by mouth daily.    . furosemide (LASIX) 40 MG tablet Take 1 tablet (40 mg total) by mouth 2 (two) times daily. 60 tablet 10  . levothyroxine (SYNTHROID, LEVOTHROID) 100 MCG tablet TAKE ONE TABLET BY MOUTH EVERY DAY BEFORE BREAKFAST. 90 tablet 1  . OXYGEN Inhale into the lungs. 1Lt at bedtime    . potassium chloride 20 MEQ TBCR Take 20 mEq by mouth daily. 30 tablet 3  . pravastatin (PRAVACHOL) 80 MG tablet TAKE ONE TABLET BY MOUTH EVERY DAY 90 tablet 3  . traMADol (ULTRAM) 50 MG tablet TAKE ONE TABLET BY MOUTH TWICE DAILY AS NEEDED FOR PAIN 60 tablet 3  . valsartan (DIOVAN) 40 MG tablet Take 1 tablet (40 mg total) by mouth daily. 90 tablet 3  . Vitamin D, Ergocalciferol, (DRISDOL) 50000 units CAPS capsule Take 1 capsule (50,000 Units total) by mouth 2 (two) times a week. 24 capsule 3   No  facility-administered medications prior to visit.     Allergies  Allergen Reactions  . Iodinated Diagnostic Agents Rash and Other (See Comments)    CT Contrast    ROS As per HPI  PE: Blood pressure (!) 152/84, pulse 75, resp. rate 16, height '5\' 6"'  (1.676 m), weight 199 lb (90.3 kg), SpO2 98 %. Gen: Alert, well appearing.  Patient is oriented to person, place, time, and situation. AFFECT: pleasant, lucid thought and speech. CV: RRR, no m/r/g.   LUNGS: CTA bilat, nonlabored resps, good aeration in all lung fields. EXT: no clubbing or cyanosis.  No signif pitting edema.    LABS:  Lab Results  Component Value  Date   TSH 2.52 05/07/2017   Lab Results  Component Value Date   WBC 3.4 (L) 10/05/2017   HGB 11.9 10/05/2017   HCT 37.4 10/05/2017   MCV 88.0 10/05/2017   PLT 120 (L) 10/05/2017   Lab Results  Component Value Date   CREATININE 0.86 05/07/2017   BUN 14 05/07/2017   NA 143 05/07/2017   K 4.3 05/07/2017   CL 101 05/07/2017   CO2 33 (H) 05/07/2017   Lab Results  Component Value Date   ALT 11 05/07/2017   AST 15 05/07/2017   ALKPHOS 56 05/07/2017   BILITOT 0.5 05/07/2017   Lab Results  Component Value Date   CHOL 183 05/07/2017   Lab Results  Component Value Date   HDL 62.60 05/07/2017   Lab Results  Component Value Date   LDLCALC 100 (H) 07/03/2016   Lab Results  Component Value Date   TRIG 202.0 (H) 05/07/2017   Lab Results  Component Value Date   CHOLHDL 3 05/07/2017   Lab Results  Component Value Date   HGBA1C 4.7 09/09/2014    IMPRESSION AND PLAN:  1) Uncontrolled HTN: improving. BMET today: may need to go up on valsartan to 80 mg qd depending on K and Cr.  2) Vit D def: recheck Vit D level today.  Plan to continue 50K dose twice per week indefinitely.  3) Hypothyroidism: taking med correctly.  Monitor TSH today.  4) CRI III: avoiding NSAIDs, watching hydration status.  BMET today.  5) Anxiety/insomnia: clonazepam helping.  Dr.  Lenna Gilford, her pulm MD, had been rx'ing this med for her but he has retired so I'll assume rx'ing resp for this and her tramadol. Will get CSC done at next f/u visit.  No rx's for clonaz or tramadol were done today.  An After Visit Summary was printed and given to the patient.  FOLLOW UP: Return in about 6 months (around 05/07/2018) for annual CPE (fasting).  Signed:  Crissie Sickles, MD           11/05/2017

## 2017-11-06 ENCOUNTER — Other Ambulatory Visit (INDEPENDENT_AMBULATORY_CARE_PROVIDER_SITE_OTHER): Payer: PPO

## 2017-11-06 DIAGNOSIS — R7301 Impaired fasting glucose: Secondary | ICD-10-CM

## 2017-11-06 LAB — HEMOGLOBIN A1C: Hgb A1c MFr Bld: 5.3 % (ref 4.6–6.5)

## 2017-11-10 DIAGNOSIS — I10 Essential (primary) hypertension: Secondary | ICD-10-CM | POA: Diagnosis not present

## 2017-11-10 DIAGNOSIS — J9600 Acute respiratory failure, unspecified whether with hypoxia or hypercapnia: Secondary | ICD-10-CM | POA: Diagnosis not present

## 2017-11-13 NOTE — Progress Notes (Signed)
Several phone calls attempted in the past week to inform patient of results, unable to leave message on machine.

## 2017-12-11 DIAGNOSIS — J9600 Acute respiratory failure, unspecified whether with hypoxia or hypercapnia: Secondary | ICD-10-CM | POA: Diagnosis not present

## 2017-12-11 DIAGNOSIS — I10 Essential (primary) hypertension: Secondary | ICD-10-CM | POA: Diagnosis not present

## 2018-01-05 ENCOUNTER — Other Ambulatory Visit: Payer: Self-pay | Admitting: Family Medicine

## 2018-01-07 ENCOUNTER — Inpatient Hospital Stay: Payer: PPO | Attending: Oncology

## 2018-01-07 DIAGNOSIS — C8200 Follicular lymphoma grade I, unspecified site: Secondary | ICD-10-CM | POA: Diagnosis not present

## 2018-01-07 LAB — CBC WITH DIFFERENTIAL (CANCER CENTER ONLY)
Abs Immature Granulocytes: 0.01 10*3/uL (ref 0.00–0.07)
Basophils Absolute: 0 10*3/uL (ref 0.0–0.1)
Basophils Relative: 1 %
Eosinophils Absolute: 0.1 10*3/uL (ref 0.0–0.5)
Eosinophils Relative: 2 %
HCT: 38 % (ref 36.0–46.0)
Hemoglobin: 12.3 g/dL (ref 12.0–15.0)
Immature Granulocytes: 0 %
Lymphocytes Relative: 31 %
Lymphs Abs: 1.1 10*3/uL (ref 0.7–4.0)
MCH: 29.8 pg (ref 26.0–34.0)
MCHC: 32.4 g/dL (ref 30.0–36.0)
MCV: 92 fL (ref 80.0–100.0)
Monocytes Absolute: 0.5 10*3/uL (ref 0.1–1.0)
Monocytes Relative: 13 %
Neutro Abs: 1.9 10*3/uL (ref 1.7–7.7)
Neutrophils Relative %: 53 %
Platelet Count: 127 10*3/uL — ABNORMAL LOW (ref 150–400)
RBC: 4.13 MIL/uL (ref 3.87–5.11)
RDW: 13.3 % (ref 11.5–15.5)
WBC Count: 3.6 10*3/uL — ABNORMAL LOW (ref 4.0–10.5)
nRBC: 0 % (ref 0.0–0.2)

## 2018-01-09 ENCOUNTER — Telehealth: Payer: Self-pay | Admitting: *Deleted

## 2018-01-09 ENCOUNTER — Other Ambulatory Visit: Payer: Self-pay | Admitting: Pulmonary Disease

## 2018-01-09 NOTE — Telephone Encounter (Signed)
-----   Message from Ladell Pier, MD sent at 01/07/2018  5:01 PM EST ----- Please call patient, the hemoglobin and platelets are stable, follow-up as scheduled

## 2018-01-09 NOTE — Telephone Encounter (Signed)
Attempted call to patient regarding recent HGB and platelet results. Tried both home # and mobile #. Unable to leave voicemail on either phone. TCT patient's daughter, Amanda Sellers. Spoke with [employer]. Informed her that her mother's lab results are stable.  Amanda Sellers voiced understanding and appreciated the call. She is aware of her mother's upcoming appts.  No questions or concerns.

## 2018-01-10 DIAGNOSIS — J9600 Acute respiratory failure, unspecified whether with hypoxia or hypercapnia: Secondary | ICD-10-CM | POA: Diagnosis not present

## 2018-01-10 DIAGNOSIS — I10 Essential (primary) hypertension: Secondary | ICD-10-CM | POA: Diagnosis not present

## 2018-01-21 ENCOUNTER — Other Ambulatory Visit: Payer: Self-pay | Admitting: *Deleted

## 2018-01-21 MED ORDER — ALPRAZOLAM 0.5 MG PO TABS: ORAL_TABLET | ORAL | 0 refills | Status: DC

## 2018-01-21 NOTE — Telephone Encounter (Signed)
RF request for alprazolam LOV: 11/05/17 Next ov: 05/09/18 Last written: 05/07/17 #30 w/ 0RF  Please advise. Thanks.

## 2018-01-23 HISTORY — PX: CATARACT EXTRACTION: SUR2

## 2018-01-30 ENCOUNTER — Ambulatory Visit (HOSPITAL_COMMUNITY)
Admission: RE | Admit: 2018-01-30 | Discharge: 2018-01-30 | Disposition: A | Discharge status: Home / Self Care | Payer: PPO | Source: Ambulatory Visit | Attending: Nurse Practitioner | Admitting: Nurse Practitioner

## 2018-01-30 ENCOUNTER — Encounter (HOSPITAL_COMMUNITY): Payer: Self-pay | Admitting: Nurse Practitioner

## 2018-01-30 VITALS — BP 130/64 | HR 72 | Ht 66.0 in | Wt 201.6 lb

## 2018-01-30 DIAGNOSIS — Z9221 Personal history of antineoplastic chemotherapy: Secondary | ICD-10-CM | POA: Insufficient documentation

## 2018-01-30 DIAGNOSIS — R0600 Dyspnea, unspecified: Secondary | ICD-10-CM | POA: Diagnosis not present

## 2018-01-30 DIAGNOSIS — E785 Hyperlipidemia, unspecified: Secondary | ICD-10-CM | POA: Diagnosis not present

## 2018-01-30 DIAGNOSIS — N183 Chronic kidney disease, stage 3 (moderate): Secondary | ICD-10-CM | POA: Insufficient documentation

## 2018-01-30 DIAGNOSIS — Z9981 Dependence on supplemental oxygen: Secondary | ICD-10-CM | POA: Diagnosis not present

## 2018-01-30 DIAGNOSIS — I130 Hypertensive heart and chronic kidney disease with heart failure and stage 1 through stage 4 chronic kidney disease, or unspecified chronic kidney disease: Secondary | ICD-10-CM | POA: Insufficient documentation

## 2018-01-30 DIAGNOSIS — J449 Chronic obstructive pulmonary disease, unspecified: Secondary | ICD-10-CM | POA: Diagnosis not present

## 2018-01-30 DIAGNOSIS — E039 Hypothyroidism, unspecified: Secondary | ICD-10-CM | POA: Insufficient documentation

## 2018-01-30 DIAGNOSIS — Z8572 Personal history of non-Hodgkin lymphomas: Secondary | ICD-10-CM | POA: Diagnosis not present

## 2018-01-30 DIAGNOSIS — Z79890 Hormone replacement therapy (postmenopausal): Secondary | ICD-10-CM | POA: Diagnosis not present

## 2018-01-30 DIAGNOSIS — I5032 Chronic diastolic (congestive) heart failure: Secondary | ICD-10-CM | POA: Diagnosis not present

## 2018-01-30 DIAGNOSIS — Z808 Family history of malignant neoplasm of other organs or systems: Secondary | ICD-10-CM | POA: Insufficient documentation

## 2018-01-30 DIAGNOSIS — Z79899 Other long term (current) drug therapy: Secondary | ICD-10-CM | POA: Diagnosis not present

## 2018-01-30 DIAGNOSIS — Z7901 Long term (current) use of anticoagulants: Secondary | ICD-10-CM | POA: Insufficient documentation

## 2018-01-30 DIAGNOSIS — E559 Vitamin D deficiency, unspecified: Secondary | ICD-10-CM | POA: Insufficient documentation

## 2018-01-30 DIAGNOSIS — I480 Paroxysmal atrial fibrillation: Secondary | ICD-10-CM | POA: Diagnosis not present

## 2018-01-30 DIAGNOSIS — E538 Deficiency of other specified B group vitamins: Secondary | ICD-10-CM | POA: Insufficient documentation

## 2018-01-30 DIAGNOSIS — I4891 Unspecified atrial fibrillation: Secondary | ICD-10-CM | POA: Diagnosis not present

## 2018-01-30 LAB — COMPREHENSIVE METABOLIC PANEL
ALT: 26 U/L (ref 0–44)
AST: 26 U/L (ref 15–41)
Albumin: 4 g/dL (ref 3.5–5.0)
Alkaline Phosphatase: 40 U/L (ref 38–126)
Anion gap: 7 (ref 5–15)
BUN: 12 mg/dL (ref 8–23)
CO2: 31 mmol/L (ref 22–32)
Calcium: 9 mg/dL (ref 8.9–10.3)
Chloride: 101 mmol/L (ref 98–111)
Creatinine, Ser: 1 mg/dL (ref 0.44–1.00)
GFR calc Af Amer: >60 mL/min (ref >60)
GFR calc non Af Amer: 52 mL/min — ABNORMAL LOW (ref >60)
Glucose, Bld: 107 mg/dL — ABNORMAL HIGH (ref 70–99)
Potassium: 4.2 mmol/L (ref 3.5–5.1)
Sodium: 139 mmol/L (ref 135–145)
Total Bilirubin: 0.7 mg/dL (ref 0.3–1.2)
Total Protein: 6.3 g/dL — ABNORMAL LOW (ref 6.5–8.1)

## 2018-01-30 NOTE — Progress Notes (Signed)
Patient ID: Amanda Sellers, female   DOB: 22-Sep-1932, 85 y.o.   MRN: [id number]           Primary Care Physician: Tammi Sou, MD Referring Physician: Dr. Ronni Rumble Amanda Sellers is a 85 y.o. female with a h/o  diastolic heart failure, afib, newly diagnosed with recent dx of lymphoma. She was admitted to [facility] 9/22 on IV dilt and given diuretics for acute on chronic systolic HF. It was arranged for to undergo TEE DCCV.  She had exertional dyspnea with PND & orthopnea.She felt very tired & had little energy. She  noted a few palpitations, but does not recall rapid irregular heart rates/rhythms.   She had TEE without thrombus and underwent DCCV that was successful with one shock of 120J. She did well post procedure and was seen by Dr. Jerilynn Mages. Croitoru and found stable for discharge. She was negative 2700cc since admit and weight was down 4 lbs with IV diuretics. D/C wt is 168.  Plan on d/c was for  uninterrupted anticoagulation for next 30 days, preferably lifelong barring bleeding complications. AFib clinic follow up in 1-2 weeks. Outpatient [facility], non-urgent. ASA stopped.   She was seen in the afib clinic, 10/10 and found to be in afib with rvr. She went into afib short time after cardioversion. EKG showed afib at 130-140 bpm. She was  not on any drugs for rate control.  Is taking DOAC without fail. Continues with chemotherapy for lymphoma.   She was started on BB and  returned to clinic 10/12 with better v rates. 80's with sitting and 90-100's with activity. She does not have any PND/orthopnea although she has slept better in her recliner for months now.No lower extremity edema. Her weight is stable at home.  She has  finished chemotherapy for non- hodgkin's lymphoma and had f/u Pet scan 10/26,  and CA appears to be in remission, so per pt, no more chemo for right now. She has been on DOAC now x one month so will start attempts to restore SR. Discussed options  amiodarone or tikosyn and she would prefer amiodarone as she has two neighbors on amiodarone and they are both in rhythm and tolerating drug well. She is not interested in a hospital stay for drug loading. She reports rash with recent contrast dye. Amiodarone was started 200 mg bid, to then go down to 200 mg a day and then would discuss cardioversion.  She asked to be seen 11/2,  in the afib clinic for increased shortness of breath yesterday and last night. She has slept in the recliner for months but did not feel as well last night and slept poorly, feeling more short of breath and fullness in her chest.She has noticed increased pedal edema. She did not take her amiodarone last night or this am. Ekg shows rate controlled afib at 82 bpm, qtc at 464 ms.PO on RA is 98%. Weight is up 2 lbs from last visit, 6 lbs since 10/17. Lasix dose was decreased to 40 mg daily from 40 mg am and 20 mg pm when seen by Dr. Ellyn Hack 10/22.. Pt has noticed weight climbing for several days. She is fairly comfortable when quiet and sitting.   She was asked to double lasix to 80 mg  x 3 days, and to reduce amiodarone 200 mg a day. She had lost 3 pounds on return to clinic several days later. Clinically, she is much improved with more energy and less shortness of  breath. Able to sleep well. Had enough energy to walk into clinic today without wheelchair and felt well enough to clean her house yesterday. Ideally,I would like to load amiodarone a while longer but will go ahead and  pursue DCCV sooner than later, as it is contributing to her heart failure. Compliant with blood thinner without missed doses On recent labs, TSH was elevated but daughter explained that she just started taking replacement correctly on an empty stomach, and sees PCP 11/16 and she expects him to adjust the dose.  11/14-  Returns to afib clinic with a smile on her face and reporting all the activities she has been doing over the last week, having returned to  sinus rhythm after cardioversion. Much more energy and breathing is much improved.  Fluid status is stable.  12/13- Returns to afib clinic for f/u and reports doing well No afib, no further shortness of breath or edema. She is back to her usual activities. Got a good report form the Ca unit recently. Tolerating amiodarone 200 mg a day.  Return to afib clinic 4/18, reports that she is staying in [place]. Saw pulmonology yesterday for some c/o of shortness of breath but breathing much improved since staying in SR. Xray no active process, thyroid, liver studies drawn yesterday, and normal. MD said he would call hr after results of tests were reviewed. Fluid status normal.  F/u in afib clinic 01/31/16, pt continues to do well without any afib. Her fluid status is normal. Last TSH, 3 months ago, showed mild increase and will check TSH and liver panel today. Her breathing is good other than the last week in the bitter cold weather  she does not breath as good outside.No bleeding issues with eliquis. Fluid status is stable on lasix.  F/u in afib 01/26/17  for shortness of breath. Pt has been having shortness of breath for some while but contributed it to her knees and difficultly in ambulating. She was seen by pulmonology in July with improved PFT's and was OK to continue amiodarone. She was seen in November by Dr. Ellyn Hack and shortness of breath was thought to be stable/chronic. She feels that her shortness of breath has worsened since Christmas.  If she walks to the bathroom and back she is entirely out of breath. Sleeps on 2 pillows and has not felt the need to increase to elevate her head more at night. Ekg today shows junctional rhythm at 46 bpm. She is breathless speaking and started walking to clinic room but had to stop and get w/c. PO 99%.  F/u in afib clinic 1/7, after reducing amiodarone to 1/2 tab a day as well BB to 1/2 tab a day for brady as well as increase in shortness of breath. She is feeling much better  today to the point she was able to walk to clinic and not use w/c. She is not breathless talking.  F/u in afib clinic, 1/23, She feels great! Saw her pulmonologist recently and she said he was very pleased with her lung status. She is walking with a walker and observed coming into the clinic, no exertional dyspnea noted.  Her  HR is Sinus in the 40's today, but she is not symptomatic. She checks her HR with a pulse ox several times a day and it usually runs around 53-60.   F/u in afib clinic, 08/15/17. She is feeling well. Just saw Dr. Lenna Gilford and was given a good report. Ekg, SR with HR's in the 50's. 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 She has some chromic LE, which was worse after recent knee injections.. She did increase lasix for a couple of days and now back to baseline now.  F/u in afib clinic, 1/8, she is doing very well. She is in SR and has not noted any afib. 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 No LLE. She walks with a walker, no falls. No shortness of breath. On amiodarone 100 mg qd. Notices a little numbness in her distal fingertips intermittently.No issues with anticoagulation.  Today, she denies symptoms of palpitations, chest pain,  orthopnea, PND, mild chronic lower extremity edema, no dizziness, presyncope, syncope, or neurologic sequela.  The patient is tolerating medications without difficulties and is otherwise without complaint today.   Past Medical History:  Diagnosis Date  . Anemia of chronic disease   . Anticoagulation adequate, Eliquis 10/16/2014  . Anxiety    clonaz helps this AND her breathing  . Chronic diastolic congestive heart failure ([place]) 16/10/9602   Complicated by atrial fibrillation; Echo 07/23/14: mild LVH, EF 50-55%, grade 2 diast dysfxn.  NYHA class II.  Marland Kitchen Chronic renal insufficiency, stage III (moderate) (HCC)    GFR @ 50  . COPD (chronic obstructive pulmonary disease) ([place])    recently noted on Xray, does not have any problems  .  Family history of adverse reaction to anesthesia    daughter has difficulty waking up  . Follicular lymphoma ([place])    Non Hodgkins B cell lymphoma; s/p chemo summer 2016--in remission as of 09/2017.  Marland Kitchen HLD (hyperlipidemia)     06/2014  . HTN (hypertension)   . Hypothyroidism   . IFG (impaired fasting glucose)   . Lip cancer    Dr. Janace Hoard excised this: invasive SCC--no sign of cancer at ENT f/u 10/2015  . Microcytic anemia    transfused 3 U total in hosp 07/2014  . Obesity (BMI 30-39.9)   . Olecranon bursitis of right elbow 02/2015  . Pancytopenia due to antineoplastic chemotherapy ([place])   . Persistent atrial fibrillation    With RVR; elec cardioversion 11/30/14.  On Amio since 10/2014; Dr. Lenna Gilford following her from pulm standpoint regarding this med.  Marland Kitchen Positive occult stool blood test 08/08/14   Endoscopies ok 08/2014  . Primary osteoarthritis of both knees 04/2016   Dr. Gladstone Lighter: bone on bone--to get euflexxa to both knees.  . Pulmonary metastases ([place]) 07/24/2014  . Shortness of breath dyspnea    with exertion.  Dr. Lenna Gilford started her on clonazepam to try to relax her chest wall and allow more satisfying deep breath. (03/2016).  . Vitamin B 12 deficiency   . Vitamin D deficiency    dose increased to 50 K U tab TWICE weekly 04/2017   Past Surgical History:  Procedure Laterality Date  . ABDOMINAL HYSTERECTOMY  1978  . BONE MARROW BIOPSY  07/24/14  . BREAST BIOPSY    . CARDIOVERSION N/A 10/16/2014   Procedure: CARDIOVERSION;  Surgeon: Lelon Perla, MD;  Location[facility] ENDOSCOPY;  Service: Cardiovascular;  Laterality: N/A;  . CARDIOVERSION N/A 11/30/2014   Procedure: CARDIOVERSION;  Surgeon: Larey Dresser, MD;  Location: [facility] ENDOSCOPY;  Service: Cardiovascular;  Laterality: N/A;  . COLONOSCOPY  09/23/14   small hemorrhoids, otherwise normal (performed for IDA and heme+ stool)  . COLONOSCOPY    . MASS EXCISION Left 07/15/2015   Left lower lip mass--invasive SCC w/negative margins.  Procedure:  EXCISION MASS;  Surgeon: Melissa Montane, MD;  Location: [facility];  Service: ENT;  Laterality: Left;  Wedge excision left lower lip mass  . NM MYOVIEW LTD  10/29/2014   medium size mild surgery defect in the mid anterior and apical anterior location suggestive of breast attenuation. No reversibility. LOW RISK  . PFTs  02/2015   Restriction with diffusion defect: cardiologist referred her to pulm to help interpret PFTs and decide whether she has amiodarone toxicity  . TEE WITHOUT CARDIOVERSION N/A 10/16/2014   Procedure: TRANSESOPHAGEAL ECHOCARDIOGRAM (TEE);  Surgeon: Lelon Perla, MD;  Location: [facility] ENDOSCOPY;  Service: Cardiovascular;  Laterality: N/A;  . TRANSTHORACIC ECHOCARDIOGRAM  07/23/14   mild LVH, EF 50-55%, grade 2 diast dysfxn  . UPPER GI ENDOSCOPY  09/23/2014   small hiatus hernia, nodules in stomach biopsied (chronic active erosive atrophic gastritis with intestinal metaplasia--no dysplasia or malignancy) otherwise normal    Current Outpatient Medications  Medication Sig Dispense Refill  . ALPRAZolam (XANAX) 0.5 MG tablet 1 tab po qhs prn anxiety-related insomnia 30 tablet 0  . amiodarone (PACERONE) 200 MG tablet Take 0.5 tablets (100 mg total) by mouth daily. 90 tablet 3  . apixaban (ELIQUIS) 5 MG TABS tablet Take 1 tablet (5 mg total) by mouth 2 (two) times daily. 28 tablet 0  . clonazePAM (KLONOPIN) 0.5 MG tablet TAKE 1/2 TO 1 TABLET BY MOUTH TWICE DAILY AS NEEDED 60 tablet 5  . cyanocobalamin (,VITAMIN B-12,) 1000 MCG/ML injection INJECT 1 ML (1000 MCG TOTAL) INTO THE SKIN EVERY 30 DAYS. 1 mL 12  . ferrous sulfate 325 (65 FE) MG tablet Take 1 tablet (325 mg total) by mouth 2 (two) times daily with a meal. 30 tablet 1  . fish oil-omega-3 fatty acids 1000 MG capsule Take 2 g by mouth daily.    . furosemide (LASIX) 40 MG tablet Take 1 tablet (40 mg total) by mouth 2 (two) times daily. 60 tablet 10  . levothyroxine (SYNTHROID, LEVOTHROID) 100 MCG tablet TAKE ONE TABLET BY MOUTH EVERY DAY  BEFORE BREAKFAST. 90 tablet 1  . OXYGEN Inhale into the lungs. 1Lt at bedtime    . potassium chloride 20 MEQ TBCR Take 20 mEq by mouth daily. 30 tablet 3  . pravastatin (PRAVACHOL) 80 MG tablet TAKE ONE TABLET BY MOUTH EVERY DAY 90 tablet 3  . traMADol (ULTRAM) 50 MG tablet TAKE ONE TABLET BY MOUTH TWICE DAILY AS NEEDED FOR PAIN 60 tablet 3  . valsartan (DIOVAN) 40 MG tablet Take 1 tablet (40 mg total) by mouth daily. 90 tablet 3  . Vitamin D, Ergocalciferol, (DRISDOL) 50000 units CAPS capsule Take 1 capsule (50,000 Units total) by mouth 2 (two) times a week. 24 capsule 3   No current facility-administered medications for this encounter.     Allergies  Allergen Reactions  . Iodinated Diagnostic Agents Rash and Other (See Comments)    CT Contrast    Social History   Socioeconomic History  . Marital status: Married    Spouse name: Not on file  . Number of children: 8  . Years of education: Not on file  . Highest education level: Not on file  Occupational History  . Occupation: retired  Scientific laboratory technician  . Financial resource strain: Not on file  . Food insecurity:    Worry: Not on file    Inability: Not on file  . Transportation needs:    Medical: Not on file    Non-medical: Not on file  Tobacco Use  . Smoking  status: Never Smoker  . Smokeless tobacco: Never Used  Substance and Sexual Activity  . Alcohol use: No    Alcohol/week: 0.0 standard drinks  . Drug use: No  . Sexual activity: Not on file  Lifestyle  . Physical activity:    Days per week: Not on file    Minutes per session: Not on file  . Stress: Not on file  Relationships  . Social connections:    Talks on phone: Not on file    Gets together: Not on file    Attends religious service: Not on file    Active member of club or organization: Not on file    Attends meetings of clubs or organizations: Not on file    Relationship status: Not on file  . Intimate partner violence:    Fear of current or ex partner: Not  on file    Emotionally abused: Not on file    Physically abused: Not on file    Forced sexual activity: Not on file  Other Topics Concern  . Not on file  Social History Narrative   Widowed, has 8 children.   Orig from [place], now lives in [place].   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   No tob/alc/drugs.    Family History  Problem Relation Age of Onset  . Melanoma Mother   . Stomach cancer Father   . Lymphoma Sister   . Diabetes Daughter     ROS- All systems are reviewed and negative except as per the HPI above  Physical Exam: Vitals:   01/30/18 1029  BP: 130/64  Pulse: 72  Weight: 91.4 kg  Height: _0  (1.676 m)    GEN- The patient is well appearing, alert and oriented x 3 today.   Head- normocephalic, atraumatic Eyes-  Sclera clear, conjunctiva pink Ears- hearing intact Oropharynx- clear Neck- supple, no JVP Lymph- no cervical lymphadenopathy Lungs- Clear to ausculation bilaterally, normal work of breathing Heart-  regular rate and rhythm, no murmurs, rubs or gallops, PMI not laterally displaced GI- soft, NT, ND, + BS Extremities- no clubbing, cyanosis,  Trace edema MS- no significant deformity or atrophy Skin- no rash or lesion Psych- euthymic mood, full affect Neuro- strength and sensation are intact  EKG- Sinus rhythm at 72 bpm with a LBBB(appears new), PR int 220 ms, qrs int 1342 ms, qtc 503 ms ( will message Dr. Ellyn Hack to have him review EKG ) Epic records reviewed. Labs reviewed   Assessment and Plan: 1 Afib Maintaining SR on 100 mg amiodarone Feels well  Off BB for bradycardia Continue apixaban for chadsvasc score of at least 4  cmet today, Tsh checked last in October 2019  2.Dyspnea Improved with less amio/off BB Pr Dr. Lenna Gilford   3. Non Hodgkns B cell lymphoma S/p chemotherapy  4. Diastolic heart failure Continue Lasix 40 mg am and 20 mg pm Fluid status stable  F/u here in 6 months  F/u  with Pulmonology/ Dr. Ellyn Hack as scheduled  Amanda Sellers, [facility] [address] [place], [place] [address] [phone]

## 2018-02-10 DIAGNOSIS — J9600 Acute respiratory failure, unspecified whether with hypoxia or hypercapnia: Secondary | ICD-10-CM | POA: Diagnosis not present

## 2018-02-10 DIAGNOSIS — I10 Essential (primary) hypertension: Secondary | ICD-10-CM | POA: Diagnosis not present

## 2018-02-15 ENCOUNTER — Other Ambulatory Visit: Payer: Self-pay | Admitting: Pulmonary Disease

## 2018-02-19 NOTE — Telephone Encounter (Signed)
Per [name] will route to her to follow up on.

## 2018-03-08 ENCOUNTER — Other Ambulatory Visit: Payer: Self-pay | Admitting: Family Medicine

## 2018-03-13 DIAGNOSIS — J9600 Acute respiratory failure, unspecified whether with hypoxia or hypercapnia: Secondary | ICD-10-CM | POA: Diagnosis not present

## 2018-03-13 DIAGNOSIS — I10 Essential (primary) hypertension: Secondary | ICD-10-CM | POA: Diagnosis not present

## 2018-04-05 ENCOUNTER — Telehealth: Payer: Self-pay | Admitting: Family Medicine

## 2018-04-05 NOTE — Telephone Encounter (Signed)
Copied from [place] [phone] [id number]. Topic: Quick Communication - Rx Refill/Question >> Apr 05, 2018 10:30 AM Blase Mess A wrote: Medication: traMADol (ULTRAM) 50 MG tablet [[id number]] Pharmacy said for patient to call to state ok to fill early. Thank you  Has the patient contacted their pharmacy? Yes  (Agent: If no, request that the patient contact the pharmacy for the refill.) (Agent: If yes, when and what did the pharmacy advise?)  Preferred Pharmacy (with phone number or street name): [id number] - [place], [place] - [address] [phone] (Phone) [phone] (Fax)    Agent: Please be advised that RX refills may take up to 3 business days. We ask that you follow-up with your pharmacy.

## 2018-04-05 NOTE — Telephone Encounter (Signed)
Per our records and the PMP aware pt is not due for refill. Last fill date per PMP website was 03/25/18.   SW pt and advised her that this is a controlled medication and we can not authorize earlier refills. Pt voiced understanding and stated that she was just trying to do what she was being told to do by new/media.

## 2018-04-08 ENCOUNTER — Telehealth: Payer: Self-pay | Admitting: Oncology

## 2018-04-08 ENCOUNTER — Inpatient Hospital Stay: Payer: PPO | Attending: Oncology | Admitting: Oncology

## 2018-04-08 ENCOUNTER — Other Ambulatory Visit: Payer: Self-pay

## 2018-04-08 ENCOUNTER — Other Ambulatory Visit: Payer: Self-pay | Admitting: *Deleted

## 2018-04-08 ENCOUNTER — Inpatient Hospital Stay: Payer: PPO

## 2018-04-08 VITALS — BP 179/67 | HR 75 | Temp 98.0°F | Resp 19 | Ht 66.0 in | Wt 202.3 lb

## 2018-04-08 DIAGNOSIS — C8200 Follicular lymphoma grade I, unspecified site: Secondary | ICD-10-CM | POA: Diagnosis not present

## 2018-04-08 LAB — CBC WITH DIFFERENTIAL (CANCER CENTER ONLY)
Abs Immature Granulocytes: 0.01 10*3/uL (ref 0.00–0.07)
Basophils Absolute: 0 10*3/uL (ref 0.0–0.1)
Basophils Relative: 0 %
Eosinophils Absolute: 0.1 10*3/uL (ref 0.0–0.5)
Eosinophils Relative: 2 %
HCT: 41.7 % (ref 36.0–46.0)
Hemoglobin: 13.5 g/dL (ref 12.0–15.0)
Immature Granulocytes: 0 %
Lymphocytes Relative: 27 %
Lymphs Abs: 1 10*3/uL (ref 0.7–4.0)
MCH: 29.9 pg (ref 26.0–34.0)
MCHC: 32.4 g/dL (ref 30.0–36.0)
MCV: 92.5 fL (ref 80.0–100.0)
Monocytes Absolute: 0.4 10*3/uL (ref 0.1–1.0)
Monocytes Relative: 10 %
Neutro Abs: 2.4 10*3/uL (ref 1.7–7.7)
Neutrophils Relative %: 61 %
Platelet Count: 118 10*3/uL — ABNORMAL LOW (ref 150–400)
RBC: 4.51 MIL/uL (ref 3.87–5.11)
RDW: 13.4 % (ref 11.5–15.5)
WBC Count: 3.9 10*3/uL — ABNORMAL LOW (ref 4.0–10.5)
nRBC: 0 % (ref 0.0–0.2)

## 2018-04-08 MED ORDER — ALPRAZOLAM 0.5 MG PO TABS: ORAL_TABLET | ORAL | 0 refills | Status: DC

## 2018-04-08 NOTE — Progress Notes (Signed)
Xanax refill called in per Dr. Benay Spice request.

## 2018-04-08 NOTE — Progress Notes (Deleted)
  [facility] [facility] OFFICE PROGRESS NOTE   Diagnosis:   INTERVAL HISTORY:   ***  Objective:  Vital signs in last 24 hours:  Blood pressure (!) 179/67, pulse 75, temperature 98 F (36.7 C), temperature source Oral, resp. rate 19, height _0  (1.676 m), weight 202 lb 4.8 oz (91.8 kg), SpO2 98 %.    HEENT: *** Lymphatics: *** Resp: *** Cardio: *** GI: *** Vascular: *** Neuro:***  Skin:***   Portacath/PICC-without erythema  Lab Results:  Lab Results  Component Value Date   WBC 3.9 (L) 04/08/2018   HGB 13.5 04/08/2018   HCT 41.7 04/08/2018   MCV 92.5 04/08/2018   PLT 118 (L) 04/08/2018   NEUTROABS 2.4 04/08/2018    CMP  Lab Results  Component Value Date   NA 139 01/30/2018   K 4.2 01/30/2018   CL 101 01/30/2018   CO2 31 01/30/2018   GLUCOSE 107 (H) 01/30/2018   BUN 12 01/30/2018   CREATININE 1.00 01/30/2018   CALCIUM 9.0 01/30/2018   PROT 6.3 (L) 01/30/2018   ALBUMIN 4.0 01/30/2018   AST 26 01/30/2018   ALT 26 01/30/2018   ALKPHOS 40 01/30/2018   BILITOT 0.7 01/30/2018   GFRNONAA 52 (L) 01/30/2018   GFRAA >60 01/30/2018    No results found for: CEA1  Lab Results  Component Value Date   INR 1.07 07/15/2015    Imaging:  No results found.  Medications: I have reviewed the patient's current medications.   Assessment/Plan: 1.Non-Hodgkin's Lymphoma-bone marrow biopsy 07/24/2014 consistent with involvement by follicular B-cell lymphoma, CD20 positive   CTs of the chest 07/22/2014 and CT of the abdomen and pelvis on 07/23/2014-pulmonary nodules, hilar/mediastinal/supraclavicular/axillary adenopathy, splenomegaly, abdominal adenopathy, bilateral adrenal nodules   Cycle 1 bendamustine/rituximab 08/04/2014  Cycle 2 bendamustine/Rituxan 09/01/2014  Cycle 3 bendamustine/Rituxan 09/30/2014  Cycle 4 bendamustine/rituximab 10/27/2014  Restaging CT scans 11/17/2014 with significant improvement in diffuse lymphadenopathy, lung nodules, and  renal lesions 2. Severe microcytic anemia, status post transfusion with packed red blood cells 07/23/2014   Normal ferritin, low transferrin saturation, "scant" bone marrow iron stores, review of peripheral blood smear consistent with iron deficiency anemia   colonoscopy and upper endoscopy 09/23/2014 3. Exertional dyspnea /orthopnea-most likely secondary to congestive heart failure  4. History of B-12 deficiency  5. Rash over the trunk and extremities 08/18/2014. Appears consistent with a drug rash. Resolved 08/28/2014.  Rash 11/18/2014, likely secondary to a contrast allergy, diagnostic contrast will be listed as an allergy 6. Colonoscopy 06/03/2008. Medium sized internal hemorrhoids.  7. Admission with atrial flutter with a rapid ventricular response 10/15/2014-status post cardioversion 10/16/2014, maintained on  apixaban  Repeat cardioversion 11/30/2014  8. Right olecranon skin lesion-likely a lipoma or cyst 9. 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

## 2018-04-08 NOTE — Progress Notes (Signed)
  [facility] OFFICE PROGRESS NOTE   Diagnosis: Non-Hodgkin's lymphoma  INTERVAL HISTORY:   [name] returns as scheduled.  She feels well.  Good appetite.  No fever or night sweats.  No palpable lymph nodes.  She is working.  Objective:  Vital signs in last 24 hours:  Blood pressure (!) 179/67, pulse 75, temperature 98 F (36.7 C), temperature source Oral, resp. rate 19, height _0  (1.676 m), weight 202 lb 4.8 oz (91.8 kg), SpO2 98 %.    HEENT: Neck without mass Lymphatics: No cervical, supraclavicular, axillary, or inguinal nodes Resp: Lungs clear bilaterally Cardio: Regular rate and rhythm GI: No hepatosplenomegaly Vascular: Trace lower leg edema bilaterally   Lab Results:  Lab Results  Component Value Date   WBC 3.9 (L) 04/08/2018   HGB 13.5 04/08/2018   HCT 41.7 04/08/2018   MCV 92.5 04/08/2018   PLT 118 (L) 04/08/2018   NEUTROABS 2.4 04/08/2018    CMP  Lab Results  Component Value Date   NA 139 01/30/2018   K 4.2 01/30/2018   CL 101 01/30/2018   CO2 31 01/30/2018   GLUCOSE 107 (H) 01/30/2018   BUN 12 01/30/2018   CREATININE 1.00 01/30/2018   CALCIUM 9.0 01/30/2018   PROT 6.3 (L) 01/30/2018   ALBUMIN 4.0 01/30/2018   AST 26 01/30/2018   ALT 26 01/30/2018   ALKPHOS 40 01/30/2018   BILITOT 0.7 01/30/2018   GFRNONAA 52 (L) 01/30/2018   GFRAA >60 01/30/2018     Medications: I have reviewed the patient's current medications.   Assessment/Plan:  1.Non-Hodgkin's Lymphoma-bone marrow biopsy 07/24/2014 consistent with involvement by follicular B-cell lymphoma, CD20 positive   CTs of the chest 07/22/2014 and CT of the abdomen and pelvis on 07/23/2014-pulmonary nodules, hilar/mediastinal/supraclavicular/axillary adenopathy, splenomegaly, abdominal adenopathy, bilateral adrenal nodules   Cycle 1 bendamustine/rituximab 08/04/2014  Cycle 2 bendamustine/Rituxan 09/01/2014  Cycle 3 bendamustine/Rituxan 09/30/2014  Cycle 4  bendamustine/rituximab 10/27/2014  Restaging CT scans 11/17/2014 with significant improvement in diffuse lymphadenopathy, lung nodules, and renal lesions 2. Severe microcytic anemia, status post transfusion with packed red blood cells 07/23/2014   Normal ferritin, low transferrin saturation, "scant" bone marrow iron stores, review of peripheral blood smear consistent with iron deficiency anemia   colonoscopy and upper endoscopy 09/23/2014 3. Exertional dyspnea /orthopnea-most likely secondary to congestive heart failure  4. History of B-12 deficiency  5. Rash over the trunk and extremities 08/18/2014. Appears consistent with a drug rash. Resolved 08/28/2014.  Rash 11/18/2014, likely secondary to a contrast allergy, diagnostic contrast will be listed as an allergy 6. Colonoscopy 06/03/2008. Medium sized internal hemorrhoids.  7. Admission with atrial flutter with a rapid ventricular response 10/15/2014-status post cardioversion 10/16/2014, maintained on  apixaban  Repeat cardioversion 11/30/2014  8. Right olecranon skin lesion-likely a lipoma or cyst 9. 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  She is stable from a hematologic standpoint.  The plan is to continue observation.  She will return for an office and lab visit in 6 months.  15 minutes were spent with the patient today.  The majority of the time was used for counseling and coordination of care.  [name], MD  04/08/2018  9:03 AM

## 2018-04-08 NOTE — Telephone Encounter (Signed)
[place] appt 3/16 los.  Printed calendar and avs.

## 2018-04-11 DIAGNOSIS — J9600 Acute respiratory failure, unspecified whether with hypoxia or hypercapnia: Secondary | ICD-10-CM | POA: Diagnosis not present

## 2018-04-11 DIAGNOSIS — I10 Essential (primary) hypertension: Secondary | ICD-10-CM | POA: Diagnosis not present

## 2018-04-29 ENCOUNTER — Other Ambulatory Visit: Payer: Self-pay | Admitting: Family Medicine

## 2018-04-29 DIAGNOSIS — Z23 Encounter for immunization: Secondary | ICD-10-CM

## 2018-04-29 NOTE — Telephone Encounter (Signed)
Patient is scheduled for virtual visit 

## 2018-04-29 NOTE — Telephone Encounter (Signed)
Received refill request for B12 injections. Pt needs appt scheduled, CPE/CMC is due. Please schedule within the next week or two to ensure getting your refills. Sent to [employer] to call and schedule.

## 2018-05-05 ENCOUNTER — Encounter: Payer: Self-pay | Admitting: Family Medicine

## 2018-05-09 ENCOUNTER — Encounter: Payer: PPO | Admitting: Family Medicine

## 2018-05-10 ENCOUNTER — Ambulatory Visit (INDEPENDENT_AMBULATORY_CARE_PROVIDER_SITE_OTHER): Payer: PPO | Admitting: Family Medicine

## 2018-05-10 ENCOUNTER — Other Ambulatory Visit: Payer: Self-pay

## 2018-05-10 ENCOUNTER — Encounter: Payer: Self-pay | Admitting: Family Medicine

## 2018-05-10 VITALS — BP 114/81 | HR 78

## 2018-05-10 DIAGNOSIS — E039 Hypothyroidism, unspecified: Secondary | ICD-10-CM

## 2018-05-10 DIAGNOSIS — F5104 Psychophysiologic insomnia: Secondary | ICD-10-CM | POA: Diagnosis not present

## 2018-05-10 DIAGNOSIS — I10 Essential (primary) hypertension: Secondary | ICD-10-CM

## 2018-05-10 DIAGNOSIS — Z23 Encounter for immunization: Secondary | ICD-10-CM

## 2018-05-10 DIAGNOSIS — F411 Generalized anxiety disorder: Secondary | ICD-10-CM | POA: Diagnosis not present

## 2018-05-10 DIAGNOSIS — N183 Chronic kidney disease, stage 3 unspecified: Secondary | ICD-10-CM

## 2018-05-10 DIAGNOSIS — E7800 Pure hypercholesterolemia, unspecified: Secondary | ICD-10-CM

## 2018-05-10 DIAGNOSIS — E559 Vitamin D deficiency, unspecified: Secondary | ICD-10-CM

## 2018-05-10 MED ORDER — CYANOCOBALAMIN 1000 MCG/ML IJ SOLN: INTRAMUSCULAR | 0 refills | Status: DC

## 2018-05-10 NOTE — Progress Notes (Signed)
Virtual Visit via Video Note  I connected with pt  on 05/10/18 at 10:20 AM EDT by a video enabled telemedicine application and verified that I am speaking with the correct person using two identifiers.  Location patient: home Location provider:work or home office Persons participating in the virtual visit: patient, provider  I discussed the limitations of evaluation and management by telemedicine and the availability of in person appointments. The patient expressed understanding and agreed to proceed.   HPI: 85 y/o WF being seen for 6 mo f/u HTN, HLD, CRI III (GFR @ 50), anxiety, hypothyroidism, and anxiety-induced insomnia. She has persistent a-fib and chronic diastolic HF, followed by cardiology regularly-->he is on amiodarone, eliquis, and lasix.  She has vit B12 deficiency and gets monthly b12 injections.   Vit D def: vit D level excellent 6 mo ago.  Takes 50K vit D dose twice weekly indefinitely.   HTN: checks bp/hr regularly and it is consistently 120/80 or better.  HR typically 70s lately. No palpitations or heart racing.  HLD: takes pravast daily w/out problem.  CRI III: drinks lots of fluids. Taking lasix bid for her HF.  Hypothyr: last TSH borderline elevated, no change in dose made.  Anxiety and anxiety-induced insomnia:  She takes clonaz in daytime for anxiety, takes low dose alprazolam hs prn for insomnia. She has been educated on the similarity of these meds and knows not to mix them.  It seems this regimen is what works best for her.  ROS: no CP, no SOB, no wheezing, no cough, no dizziness, no HAs, no rashes, no melena/hematochezia.  No polyuria or polydipsia.  No myalgias or arthralgias.   Past Medical History:  Diagnosis Date  . Anemia of chronic disease   . Anticoagulation adequate, Eliquis 10/16/2014  . Anxiety    clonaz helps this AND her breathing  . Chronic diastolic congestive heart failure ([place]) 44/03/4740   Complicated by atrial fibrillation; Echo  07/23/14: mild LVH, EF 50-55%, grade 2 diast dysfxn.  NYHA class II.  Marland Kitchen Chronic renal insufficiency, stage III (moderate) (HCC)    GFR @ 50  . COPD (chronic obstructive pulmonary disease) ([place])    recently noted on Xray, does not have any problems  . Family history of adverse reaction to anesthesia    daughter has difficulty waking up  . Follicular lymphoma ([place])    Non Hodgkins B cell lymphoma; s/p chemo summer 2016--in remission still as of 04/2018: continue obs q 14mo . HLD (hyperlipidemia)     06/2014  . HTN (hypertension)   . Hypothyroidism   . IFG (impaired fasting glucose)   . Lip cancer    Dr. BJanace Hoardexcised this: invasive SCC--no sign of cancer at ENT f/u 10/2015  . Microcytic anemia    transfused 3 U total in hosp 07/2014  . Obesity (BMI 30-39.9)   . Olecranon bursitis of right elbow 02/2015  . Pancytopenia due to antineoplastic chemotherapy ([place]   . Persistent atrial fibrillation    With RVR; elec cardioversion 11/30/14.  On Amio since 10/2014; Dr. NLenna Gilfordfollowing her from pulm standpoint regarding this med.  .Marland KitchenPositive occult stool blood test 08/08/14   Endoscopies ok 08/2014  . Primary osteoarthritis of both knees 04/2016   Dr. GGladstone Lighter bone on bone--to get euflexxa to both knees.  . [place] 07/[place]  . Shortness of breath dyspnea    with exertion.  Dr. NLenna Gilfordstarted her on clonazepam to try to relax her chest wall and allow  more satisfying deep breath. (03/2016).  . Vitamin B 12 deficiency   . Vitamin D deficiency    dose increased to 50 K U tab TWICE weekly 04/2017    Past Surgical History:  Procedure Laterality Date  . ABDOMINAL HYSTERECTOMY  1978  . BONE MARROW BIOPSY  07/24/14  . BREAST BIOPSY    . CARDIOVERSION N/A 10/16/2014   Procedure: CARDIOVERSION;  Surgeon: Lelon Perla, MD;  Location[facility] ENDOSCOPY;  Service: Cardiovascular;  Laterality: N/A;  . CARDIOVERSION N/A 11/30/2014   Procedure: CARDIOVERSION;  Surgeon: Larey Dresser, MD;   Location: [facility] ENDOSCOPY;  Service: Cardiovascular;  Laterality: N/A;  . COLONOSCOPY  09/23/14   small hemorrhoids, otherwise normal (performed for IDA and heme+ stool)  . COLONOSCOPY    . MASS EXCISION Left 07/15/2015   Left lower lip mass--invasive SCC w/negative margins.  Procedure: EXCISION MASS;  Surgeon: Melissa Montane, MD;  Location: [facility];  Service: ENT;  Laterality: Left;  Wedge excision left lower lip mass  . NM MYOVIEW LTD  10/29/2014   medium size mild surgery defect in the mid anterior and apical anterior location suggestive of breast attenuation. No reversibility. LOW RISK  . PFTs  02/2015   Restriction with diffusion defect: cardiologist referred her to pulm to help interpret PFTs and decide whether she has amiodarone toxicity  . TEE WITHOUT CARDIOVERSION N/A 10/16/2014   Procedure: TRANSESOPHAGEAL ECHOCARDIOGRAM (TEE);  Surgeon: Lelon Perla, MD;  Location: [facility] ENDOSCOPY;  Service: Cardiovascular;  Laterality: N/A;  . TRANSTHORACIC ECHOCARDIOGRAM  07/23/14   mild LVH, EF 50-55%, grade 2 diast dysfxn  . UPPER GI ENDOSCOPY  09/23/2014   small hiatus hernia, nodules in stomach biopsied (chronic active erosive atrophic gastritis with intestinal metaplasia--no dysplasia or malignancy) otherwise normal    Family History  Problem Relation Age of Onset  . Melanoma Mother   . Stomach cancer Father   . Lymphoma Sister   . Diabetes Daughter        Current Outpatient Medications:  .  ALPRAZolam (XANAX) 0.5 MG tablet, 1 tab po qhs prn anxiety-related insomnia, Disp: 30 tablet, Rfl: 0 .  amiodarone (PACERONE) 200 MG tablet, Take 0.5 tablets (100 mg total) by mouth daily., Disp: 90 tablet, Rfl: 3 .  apixaban (ELIQUIS) 5 MG TABS tablet, Take 1 tablet (5 mg total) by mouth 2 (two) times daily., Disp: 28 tablet, Rfl: 0 .  clonazePAM (KLONOPIN) 0.5 MG tablet, TAKE 1/2 TO 1 TABLET BY MOUTH TWICE DAILY AS NEEDED, Disp: 60 tablet, Rfl: 5 .  cyanocobalamin (,VITAMIN B-12,) 1000 MCG/ML injection,  INJECT 1 ML (1000 MCG TOTAL) INTO THE SKIN EVERY 30 DAYS., Disp: 1 mL, Rfl: 12 .  ferrous sulfate 325 (65 FE) MG tablet, Take 1 tablet (325 mg total) by mouth 2 (two) times daily with a meal., Disp: 30 tablet, Rfl: 1 .  fish oil-omega-3 fatty acids 1000 MG capsule, Take 2 g by mouth daily., Disp: , Rfl:  .  furosemide (LASIX) 40 MG tablet, Take 1 tablet (40 mg total) by mouth 2 (two) times daily., Disp: 60 tablet, Rfl: 10 .  levothyroxine (SYNTHROID, LEVOTHROID) 100 MCG tablet, TAKE ONE TABLET BY MOUTH EVERY DAY BEFORE BREAKFAST., Disp: 90 tablet, Rfl: 1 .  OXYGEN, Inhale into the lungs. 1Lt at bedtime, Disp: , Rfl:  .  potassium chloride 20 MEQ TBCR, Take 20 mEq by mouth daily., Disp: 30 tablet, Rfl: 3 .  pravastatin (PRAVACHOL) 80 MG tablet, TAKE ONE TABLET BY MOUTH EVERY DAY,  Disp: 90 tablet, Rfl: 1 .  traMADol (ULTRAM) 50 MG tablet, TAKE ONE TABLET BY MOUTH TWICE DAILY AS NEEDED FOR PAIN., Disp: 60 tablet, Rfl: 2 .  valsartan (DIOVAN) 40 MG tablet, Take 1 tablet (40 mg total) by mouth daily., Disp: 90 tablet, Rfl: 3 .  Vitamin D, Ergocalciferol, (DRISDOL) 50000 units CAPS capsule, Take 1 capsule (50,000 Units total) by mouth 2 (two) times a week., Disp: 24 capsule, Rfl: 3  EXAM:  VITALS per patient if applicable: BP 563/87 (BP Location: Left Arm, Patient Position: Sitting, Cuff Size: Normal)   Pulse 78    GENERAL: alert, oriented, appears well and in no acute distress  HEENT: atraumatic, conjunttiva clear, no obvious abnormalities on inspection of external nose and ears  NECK: normal movements of the head and neck  LUNGS: on inspection no signs of respiratory distress, breathing rate appears normal, no obvious gross SOB, gasping or wheezing  CV: no obvious cyanosis  MS: moves all visible extremities without noticeable abnormality  PSYCH/NEURO: pleasant and cooperative, no obvious depression or anxiety, speech and thought processing grossly intact  LABS: none today  Lab Results   Component Value Date   VITAMINB12 484 10/08/2015      Chemistry      Component Value Date/Time   NA 139 01/30/2018 1049   NA 143 11/17/2014 0801   K 4.2 01/30/2018 1049   K 3.6 11/17/2014 0801   CL 101 01/30/2018 1049   CO2 31 01/30/2018 1049   CO2 30 (H) 11/17/2014 0801   BUN 12 01/30/2018 1049   BUN 16.3 11/17/2014 0801   CREATININE 1.00 01/30/2018 1049   CREATININE 0.86 12/09/2014 0934   CREATININE 0.7 11/17/2014 0801      Component Value Date/Time   CALCIUM 9.0 01/30/2018 1049   CALCIUM 8.9 11/17/2014 0801   ALKPHOS 40 01/30/2018 1049   ALKPHOS 70 11/17/2014 0801   AST 26 01/30/2018 1049   AST 32 11/17/2014 0801   ALT 26 01/30/2018 1049   ALT 30 11/17/2014 0801   BILITOT 0.7 01/30/2018 1049   BILITOT 0.55 11/17/2014 0801     Lab Results  Component Value Date   TSH 4.68 (H) 11/05/2017   Lab Results  Component Value Date   CHOL 220 (H) 11/05/2017   HDL 75.40 11/05/2017   LDLCALC 104 (H) 11/05/2017   LDLDIRECT 87.0 05/07/2017   TRIG 200.0 (H) 11/05/2017   CHOLHDL 3 11/05/2017   Lab Results  Component Value Date   WBC 3.9 (L) 04/08/2018   HGB 13.5 04/08/2018   HCT 41.7 04/08/2018   MCV 92.5 04/08/2018   PLT 118 (L) 04/08/2018   Lab Results  Component Value Date   HGBA1C 5.3 11/06/2017    ASSESSMENT AND PLAN:  Discussed the following assessment and plan:  1) HTN: The current medical regimen is effective;  continue present plan and medications. Lytes/cr future.  2) HLD: tolerating statin.  FLP and hepatic panel future.  3) CRI III: hydrating well, avoiding NSAIDs.  4) Vit B12 def: continue 1000 mcg IM q month, RF 'd this med today.  5) Vit D def: plan 50 K dose twice a week indefinitely and keep an eye on her level. Vit D level ordered future.  6) Hypothyroidism: she is on amio so we need to keep a good eye on this. TSH ordered future.  7) Anxiety and anxiety-induced insomnia:   Still need to get CSC and UDS -->at next opportunity at an  actual "in office" visit.  No new rx's for these meds were needed today.   8) A-fib and chronic diastolic HF: stable, followed by cardiology regularly.  I discussed the assessment and treatment plan with the patient. The patient was provided an opportunity to ask questions and all were answered. The patient agreed with the plan and demonstrated an understanding of the instructions.   The patient was advised to call back or seek an in-person evaluation if the symptoms worsen or if the condition fails to improve as anticipated.  F/u: 6 mo CPE  Signed:  Crissie Sickles, MD           05/10/2018

## 2018-05-12 DIAGNOSIS — J9600 Acute respiratory failure, unspecified whether with hypoxia or hypercapnia: Secondary | ICD-10-CM | POA: Diagnosis not present

## 2018-05-12 DIAGNOSIS — I10 Essential (primary) hypertension: Secondary | ICD-10-CM | POA: Diagnosis not present

## 2018-05-17 ENCOUNTER — Ambulatory Visit: Payer: PPO

## 2018-05-17 ENCOUNTER — Other Ambulatory Visit (INDEPENDENT_AMBULATORY_CARE_PROVIDER_SITE_OTHER): Payer: PPO

## 2018-05-17 ENCOUNTER — Other Ambulatory Visit: Payer: Self-pay

## 2018-05-17 DIAGNOSIS — N183 Chronic kidney disease, stage 3 unspecified: Secondary | ICD-10-CM

## 2018-05-17 DIAGNOSIS — E7800 Pure hypercholesterolemia, unspecified: Secondary | ICD-10-CM

## 2018-05-17 DIAGNOSIS — N2889 Other specified disorders of kidney and ureter: Secondary | ICD-10-CM

## 2018-05-17 DIAGNOSIS — E559 Vitamin D deficiency, unspecified: Secondary | ICD-10-CM | POA: Diagnosis not present

## 2018-05-17 DIAGNOSIS — I10 Essential (primary) hypertension: Secondary | ICD-10-CM | POA: Diagnosis not present

## 2018-05-17 DIAGNOSIS — E039 Hypothyroidism, unspecified: Secondary | ICD-10-CM | POA: Diagnosis not present

## 2018-05-17 LAB — COMPREHENSIVE METABOLIC PANEL
ALT: 23 U/L (ref 0–35)
AST: 21 U/L (ref 0–37)
Albumin: 4.4 g/dL (ref 3.5–5.2)
Alkaline Phosphatase: 46 U/L (ref 39–117)
BUN: 20 mg/dL (ref 6–23)
CO2: 34 mEq/L — ABNORMAL HIGH (ref 19–32)
Calcium: 9 mg/dL (ref 8.4–10.5)
Chloride: 100 mEq/L (ref 96–112)
Creatinine, Ser: 0.86 mg/dL (ref 0.40–1.20)
GFR: 62.89 mL/min (ref >60.00)
Glucose, Bld: 104 mg/dL — ABNORMAL HIGH (ref 70–99)
Potassium: 4 mEq/L (ref 3.5–5.1)
Sodium: 142 mEq/L (ref 135–145)
Total Bilirubin: 0.6 mg/dL (ref 0.2–1.2)
Total Protein: 6.5 g/dL (ref 6.0–8.3)

## 2018-05-17 LAB — VITAMIN D 25 HYDROXY (VIT D DEFICIENCY, FRACTURES): VITD: 86.21 ng/mL (ref 30.00–100.00)

## 2018-05-17 LAB — LIPID PANEL
Cholesterol: 221 mg/dL — ABNORMAL HIGH (ref 0–200)
HDL: 61.1 mg/dL (ref >39.00)
NonHDL: 159.99
Total CHOL/HDL Ratio: 4
Triglycerides: 217 mg/dL — ABNORMAL HIGH (ref 0.0–149.0)
VLDL: 43.4 mg/dL — ABNORMAL HIGH (ref 0.0–40.0)

## 2018-05-17 LAB — LDL CHOLESTEROL, DIRECT: Direct LDL: 106 mg/dL

## 2018-05-17 LAB — TSH: TSH: 5.57 u[IU]/mL — ABNORMAL HIGH (ref 0.35–4.50)

## 2018-06-03 ENCOUNTER — Other Ambulatory Visit: Payer: Self-pay | Admitting: Family Medicine

## 2018-06-05 ENCOUNTER — Other Ambulatory Visit (HOSPITAL_COMMUNITY): Payer: Self-pay | Admitting: Nurse Practitioner

## 2018-06-29 ENCOUNTER — Other Ambulatory Visit: Payer: Self-pay | Admitting: Cardiology

## 2018-07-20 ENCOUNTER — Other Ambulatory Visit: Payer: Self-pay | Admitting: Family Medicine

## 2018-07-24 ENCOUNTER — Other Ambulatory Visit: Payer: Self-pay | Admitting: *Deleted

## 2018-07-24 MED ORDER — ALPRAZOLAM 0.5 MG PO TABS: ORAL_TABLET | ORAL | 0 refills | Status: DC

## 2018-08-01 ENCOUNTER — Telehealth: Payer: Self-pay | Admitting: Cardiology

## 2018-08-01 ENCOUNTER — Other Ambulatory Visit: Payer: Self-pay

## 2018-08-01 ENCOUNTER — Encounter (HOSPITAL_COMMUNITY): Payer: Self-pay | Admitting: Nurse Practitioner

## 2018-08-01 ENCOUNTER — Ambulatory Visit (HOSPITAL_COMMUNITY)
Admission: RE | Admit: 2018-08-01 | Discharge: 2018-08-01 | Disposition: A | Discharge status: Home / Self Care | Payer: PPO | Source: Ambulatory Visit | Attending: Nurse Practitioner | Admitting: Nurse Practitioner

## 2018-08-01 VITALS — BP 152/84 | HR 69 | Ht 66.0 in | Wt 209.0 lb

## 2018-08-01 DIAGNOSIS — I110 Hypertensive heart disease with heart failure: Secondary | ICD-10-CM | POA: Diagnosis not present

## 2018-08-01 DIAGNOSIS — J449 Chronic obstructive pulmonary disease, unspecified: Secondary | ICD-10-CM | POA: Diagnosis not present

## 2018-08-01 DIAGNOSIS — I5030 Unspecified diastolic (congestive) heart failure: Secondary | ICD-10-CM | POA: Diagnosis present

## 2018-08-01 DIAGNOSIS — C8510 Unspecified B-cell lymphoma, unspecified site: Secondary | ICD-10-CM | POA: Insufficient documentation

## 2018-08-01 DIAGNOSIS — E559 Vitamin D deficiency, unspecified: Secondary | ICD-10-CM | POA: Diagnosis not present

## 2018-08-01 DIAGNOSIS — Z800 Family history of malignant neoplasm of digestive organs: Secondary | ICD-10-CM | POA: Diagnosis not present

## 2018-08-01 DIAGNOSIS — Z808 Family history of malignant neoplasm of other organs or systems: Secondary | ICD-10-CM | POA: Diagnosis not present

## 2018-08-01 DIAGNOSIS — Z7901 Long term (current) use of anticoagulants: Secondary | ICD-10-CM | POA: Diagnosis not present

## 2018-08-01 DIAGNOSIS — I4891 Unspecified atrial fibrillation: Secondary | ICD-10-CM | POA: Diagnosis not present

## 2018-08-01 DIAGNOSIS — E039 Hypothyroidism, unspecified: Secondary | ICD-10-CM | POA: Insufficient documentation

## 2018-08-01 DIAGNOSIS — E538 Deficiency of other specified B group vitamins: Secondary | ICD-10-CM | POA: Insufficient documentation

## 2018-08-01 DIAGNOSIS — E785 Hyperlipidemia, unspecified: Secondary | ICD-10-CM | POA: Diagnosis not present

## 2018-08-01 DIAGNOSIS — D638 Anemia in other chronic diseases classified elsewhere: Secondary | ICD-10-CM | POA: Insufficient documentation

## 2018-08-01 DIAGNOSIS — I480 Paroxysmal atrial fibrillation: Secondary | ICD-10-CM | POA: Diagnosis not present

## 2018-08-01 DIAGNOSIS — Z807 Family history of other malignant neoplasms of lymphoid, hematopoietic and related tissues: Secondary | ICD-10-CM | POA: Insufficient documentation

## 2018-08-01 DIAGNOSIS — Z91041 Radiographic dye allergy status: Secondary | ICD-10-CM | POA: Insufficient documentation

## 2018-08-01 DIAGNOSIS — Z833 Family history of diabetes mellitus: Secondary | ICD-10-CM | POA: Diagnosis not present

## 2018-08-01 DIAGNOSIS — Z9221 Personal history of antineoplastic chemotherapy: Secondary | ICD-10-CM | POA: Diagnosis not present

## 2018-08-01 DIAGNOSIS — I5032 Chronic diastolic (congestive) heart failure: Secondary | ICD-10-CM | POA: Insufficient documentation

## 2018-08-01 DIAGNOSIS — Z79899 Other long term (current) drug therapy: Secondary | ICD-10-CM | POA: Diagnosis not present

## 2018-08-01 DIAGNOSIS — F419 Anxiety disorder, unspecified: Secondary | ICD-10-CM | POA: Diagnosis not present

## 2018-08-01 MED ORDER — FUROSEMIDE 40 MG PO TABS: 60.0000 mg | ORAL_TABLET | Freq: Two times a day (BID) | ORAL | 2 refills | Status: DC

## 2018-08-01 NOTE — Patient Instructions (Signed)
Increase lasix to 60mg  twice a day (1 and 1/2 tab twice a day)

## 2018-08-01 NOTE — Progress Notes (Signed)
Patient ID: Renae Mottley Sandoz, female   DOB: 1932/08/23, 85 y.o.   MRN: [id number]           Primary Care Physician: Tammi Sou, MD Referring Physician: Dr. Ronni Rumble EMILIJA BOHMAN is a 85 y.o. female with a h/o  diastolic heart failure, afib, newly diagnosed with recent dx of lymphoma. She was admitted to [facility] 9/22 on IV dilt and given diuretics for acute on chronic systolic HF. It was arranged for to undergo TEE DCCV.  She had exertional dyspnea with PND & orthopnea.She felt very tired & had little energy. She  noted a few palpitations, but does not recall rapid irregular heart rates/rhythms.   She had TEE without thrombus and underwent DCCV that was successful with one shock of 120J. She did well post procedure and was seen by Dr. Jerilynn Mages. Croitoru and found stable for discharge. She was negative 2700cc since admit and weight was down 4 lbs with IV diuretics. D/C wt is 168.  Plan on d/c was for  uninterrupted anticoagulation for next 30 days, preferably lifelong barring bleeding complications. AFib clinic follow up in 1-2 weeks. Outpatient [facility], non-urgent. ASA stopped.   She was seen in the afib clinic, 10/10 and found to be in afib with rvr. She went into afib short time after cardioversion. EKG showed afib at 130-140 bpm. She was  not on any drugs for rate control.  Is taking DOAC without fail. Continues with chemotherapy for lymphoma.   She was started on BB and  returned to clinic 10/12 with better v rates. 80's with sitting and 90-100's with activity. She does not have any PND/orthopnea although she has slept better in her recliner for months now.No lower extremity edema. Her weight is stable at home.  She has  finished chemotherapy for non- hodgkin's lymphoma and had f/u Pet scan 10/26,  and CA appears to be in remission, so per pt, no more chemo for right now. She has been on DOAC now x one month so will start attempts to restore SR. Discussed options  amiodarone or tikosyn and she would prefer amiodarone as she has two neighbors on amiodarone and they are both in rhythm and tolerating drug well. She is not interested in a hospital stay for drug loading. She reports rash with recent contrast dye. Amiodarone was started 200 mg bid, to then go down to 200 mg a day and then would discuss cardioversion.  She asked to be seen 11/2,  in the afib clinic for increased shortness of breath yesterday and last night. She has slept in the recliner for months but did not feel as well last night and slept poorly, feeling more short of breath and fullness in her chest.She has noticed increased pedal edema. She did not take her amiodarone last night or this am. Ekg shows rate controlled afib at 82 bpm, qtc at 464 ms.PO on RA is 98%. Weight is up 2 lbs from last visit, 6 lbs since 10/17. Lasix dose was decreased to 40 mg daily from 40 mg am and 20 mg pm when seen by Dr. Ellyn Hack 10/22.. Pt has noticed weight climbing for several days. She is fairly comfortable when quiet and sitting.   She was asked to double lasix to 80 mg  x 3 days, and to reduce amiodarone 200 mg a day. She had lost 3 pounds on return to clinic several days later. Clinically, she is much improved with more energy and less shortness of  breath. Able to sleep well. Had enough energy to walk into clinic today without wheelchair and felt well enough to clean her house yesterday. Ideally,I would like to load amiodarone a while longer but will go ahead and  pursue DCCV sooner than later, as it is contributing to her heart failure. Compliant with blood thinner without missed doses On recent labs, TSH was elevated but daughter explained that she just started taking replacement correctly on an empty stomach, and sees PCP 11/16 and she expects him to adjust the dose.  11/14-  Returns to afib clinic with a smile on her face and reporting all the activities she has been doing over the last week, having returned to  sinus rhythm after cardioversion. Much more energy and breathing is much improved.  Fluid status is stable.  12/13- Returns to afib clinic for f/u and reports doing well No afib, no further shortness of breath or edema. She is back to her usual activities. Got a good report form the Ca unit recently. Tolerating amiodarone 200 mg a day.  Return to afib clinic 4/18, reports that she is staying in [place]. Saw pulmonology yesterday for some c/o of shortness of breath but breathing much improved since staying in SR. Xray no active process, thyroid, liver studies drawn yesterday, and normal. MD said he would call hr after results of tests were reviewed. Fluid status normal.  F/u in afib clinic 01/31/16, pt continues to do well without any afib. Her fluid status is normal. Last TSH, 3 months ago, showed mild increase and will check TSH and liver panel today. Her breathing is good other than the last week in the bitter cold weather  she does not breath as good outside.No bleeding issues with eliquis. Fluid status is stable on lasix.  F/u in afib 01/26/17  for shortness of breath. Pt has been having shortness of breath for some while but contributed it to her knees and difficultly in ambulating. She was seen by pulmonology in July with improved PFT's and was OK to continue amiodarone. She was seen in November by Dr. Ellyn Hack and shortness of breath was thought to be stable/chronic. She feels that her shortness of breath has worsened since Christmas.  If she walks to the bathroom and back she is entirely out of breath. Sleeps on 2 pillows and has not felt the need to increase to elevate her head more at night. Ekg today shows junctional rhythm at 46 bpm. She is breathless speaking and started walking to clinic room but had to stop and get w/c. PO 99%.  F/u in afib clinic 1/7, after reducing amiodarone to 1/2 tab a day as well BB to 1/2 tab a day for brady as well as increase in shortness of breath. She is feeling much better  today to the point she was able to walk to clinic and not use w/c. She is not breathless talking.  F/u in afib clinic, 1/23, She feels great! Saw her pulmonologist recently and she said he was very pleased with her lung status. She is walking with a walker and observed coming into the clinic, no exertional dyspnea noted.  Her  HR is Sinus in the 40's today, but she is not symptomatic. She checks her HR with a pulse ox several times a day and it usually runs around 53-60.   F/u in afib clinic, 08/15/17. She is feeling well. Just saw Dr. Lenna Gilford and was given a good report. Ekg, SR with HR's in the 50's. 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 She has some chromic LE, which was worse after recent knee injections.. She did increase lasix for a couple of days and now back to baseline now.  F/u in afib clinic, 1/8, she is doing very well. She is in SR and has not noted any afib. 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 No LLE. She walks with a walker, no falls. No shortness of breath. On amiodarone 100 mg qd. Notices a little numbness in her distal fingertips intermittently.No issues with anticoagulation.  F/u in 07/31/18. Continues on amiodarone for afib management. She is in [place] today and has not noted any irregular heart rhythm. Her main complaint is that she has noted more LLE and has been back to sleeping in her recliner. She continues on lasix 40 mg bid. Avoids salt and salty foods and sits with her feet up during the day. No change in her health other than increase in  edema. She does drink 8 + glasses of water a day and may need to back off that some.weight is up 7 lbs when compared to last in person office visit in March.  Today, she denies symptoms of palpitations, chest pain,  orthopnea, PND, mild chronic lower extremity edema, no dizziness, presyncope, syncope, or neurologic sequela.  The patient is tolerating medications without difficulties and is otherwise without complaint today.    Past Medical History:  Diagnosis Date   Anemia of chronic disease    Anticoagulation adequate, Eliquis 10/16/2014   Anxiety    clonaz helps this AND her breathing   Chronic diastolic congestive heart failure ([place]) 16/10/9602   Complicated by atrial fibrillation; Echo 07/23/14: mild LVH, EF 50-55%, grade 2 diast dysfxn.  NYHA class II.   Chronic renal insufficiency, stage III (moderate) (HCC)    GFR @ 50   COPD (chronic obstructive pulmonary disease) ([place])    recently noted on Xray, does not have any problems   Family history of adverse reaction to anesthesia    daughter has difficulty waking up   Follicular lymphoma ([place])    Non Hodgkins B cell lymphoma; s/p chemo summer 2016--in remission still as of 04/2018: continue obs q 22mo  HLD (hyperlipidemia)     06/2014   HTN (hypertension)    Hypothyroidism    IFG (impaired fasting glucose)    Lip cancer    Dr. BJanace Hoardexcised this: invasive SCC--no sign of cancer at ENT f/u 10/2015   Microcytic anemia    transfused 3 U total in hosp 07/2014   Obesity (BMI 30-39.9)    Olecranon bursitis of right elbow 02/2015   Pancytopenia due to antineoplastic chemotherapy ([place]    Persistent atrial fibrillation    With RVR; elec cardioversion 11/30/14.  On Amio since 10/2014; Dr. NLenna Gilfordfollowing her from pulm standpoint regarding this med.   Positive occult stool blood test 08/08/14   Endoscopies ok 08/2014   Primary osteoarthritis of both knees 04/2016   Dr. GGladstone Lighter bone on bone--to get euflexxa to both knees.   Pulmonary metastases ([place] 07/24/2014   Shortness of breath dyspnea    with exertion.  Dr. NLenna Gilfordstarted her on clonazepam to try to relax her chest wall and allow more satisfying deep breath. (03/2016).   Vitamin B 12 deficiency    Vitamin D deficiency    dose increased to 50 K U tab TWICE weekly 04/2017   Past Surgical History:  Procedure Laterality Date  ABDOMINAL HYSTERECTOMY  1978   BONE MARROW BIOPSY  07/24/14    BREAST BIOPSY     CARDIOVERSION N/A 10/16/2014   Procedure: CARDIOVERSION;  Surgeon: Lelon Perla, MD;  Location: [facility] ENDOSCOPY;  Service: Cardiovascular;  Laterality: N/A;   CARDIOVERSION N/A 11/30/2014   Procedure: CARDIOVERSION;  Surgeon: Larey Dresser, MD;  Location: [facility] ENDOSCOPY;  Service: Cardiovascular;  Laterality: N/A;   COLONOSCOPY  09/23/14   small hemorrhoids, otherwise normal (performed for IDA and heme+ stool)   COLONOSCOPY     MASS EXCISION Left 07/15/2015   Left lower lip mass--invasive SCC w/negative margins.  Procedure: EXCISION MASS;  Surgeon: Melissa Montane, MD;  Location: [facility];  Service: ENT;  Laterality: Left;  Wedge excision left lower lip mass   NM MYOVIEW LTD  10/29/2014   medium size mild surgery defect in the mid anterior and apical anterior location suggestive of breast attenuation. No reversibility. LOW RISK   PFTs  02/2015   Restriction with diffusion defect: cardiologist referred her to pulm to help interpret PFTs and decide whether she has amiodarone toxicity   TEE WITHOUT CARDIOVERSION N/A 10/16/2014   Procedure: TRANSESOPHAGEAL ECHOCARDIOGRAM (TEE);  Surgeon: Lelon Perla, MD;  Location: [facility] ENDOSCOPY;  Service: Cardiovascular;  Laterality: N/A;   TRANSTHORACIC ECHOCARDIOGRAM  07/23/14   mild LVH, EF 50-55%, grade 2 diast dysfxn   UPPER GI ENDOSCOPY  09/23/2014   small hiatus hernia, nodules in stomach biopsied (chronic active erosive atrophic gastritis with intestinal metaplasia--no dysplasia or malignancy) otherwise normal    Current Outpatient Medications  Medication Sig Dispense Refill   ALPRAZolam (XANAX) 0.5 MG tablet 1 tab po qhs prn anxiety-related insomnia 30 tablet 0   amiodarone (PACERONE) 200 MG tablet Take 0.5 tablets (100 mg total) by mouth daily. 90 tablet 3   clonazePAM (KLONOPIN) 0.5 MG tablet TAKE 1/2 TO 1 TABLET BY MOUTH TWICE DAILY AS NEEDED 60 tablet 5   cyanocobalamin (,VITAMIN B-12,) 1000 MCG/ML injection INJECT 1 ML  (1000 MCG TOTAL) INTO THE SKIN EVERY 30 DAYS. 10 mL 0   ELIQUIS 5 MG TABS tablet TAKE 1 TABLET BY MOUTH TWICE A DAY 180 tablet 2   ferrous sulfate 325 (65 FE) MG tablet Take 1 tablet (325 mg total) by mouth 2 (two) times daily with a meal. 30 tablet 1   fish oil-omega-3 fatty acids 1000 MG capsule Take 2 g by mouth daily.     furosemide (LASIX) 40 MG tablet Take 1.5 tablets (60 mg total) by mouth 2 (two) times daily. 180 tablet 2   levothyroxine (SYNTHROID, LEVOTHROID) 100 MCG tablet TAKE ONE TABLET BY MOUTH EVERY DAY BEFORE BREAKFAST. 90 tablet 1   OXYGEN Inhale into the lungs. 1Lt at bedtime     potassium chloride 20 MEQ TBCR Take 20 mEq by mouth daily. 30 tablet 3   pravastatin (PRAVACHOL) 80 MG tablet TAKE 1 TABLET BY MOUTH EVERY DAY 90 tablet 1   traMADol (ULTRAM) 50 MG tablet TAKE 1 TABLET BY MOUTH TWICE A DAY AS NEEDED FOR PAIN 60 tablet 2   valsartan (DIOVAN) 40 MG tablet Take 1 tablet (40 mg total) by mouth daily. 90 tablet 3   Vitamin D, Ergocalciferol, (DRISDOL) 50000 units CAPS capsule Take 1 capsule (50,000 Units total) by mouth 2 (two) times a week. 24 capsule 3   No current facility-administered medications for this encounter.     Allergies  Allergen Reactions   Iodinated Diagnostic Agents Rash and Other (See Comments)  CT Contrast    Social History   Socioeconomic History   Marital status: Married    Spouse name: Not on file   Number of children: 8   Years of education: Not on file   Highest education level: Not on file  Occupational History   Occupation: retired  Scientist, product/process development strain: Not on file   Food insecurity    Worry: Not on file    Inability: Not on Lexicographer needs    Medical: Not on file    Non-medical: Not on file  Tobacco Use   Smoking status: Never Smoker   Smokeless tobacco: Never Used  Substance and Sexual Activity   Alcohol use: No    Alcohol/week: 0.0 standard drinks   Drug use: No     Sexual activity: Not on file  Lifestyle   Physical activity    Days per week: Not on file    Minutes per session: Not on file   Stress: Not on file  Relationships   Social connections    Talks on phone: Not on file    Gets together: Not on file    Attends religious service: Not on file    Active member of club or organization: Not on file    Attends meetings of clubs or organizations: Not on file    Relationship status: Not on file   Intimate partner violence    Fear of current or ex partner: Not on file    Emotionally abused: Not on file    Physically abused: Not on file    Forced sexual activity: Not on file  Other Topics Concern   Not on file  Social History Narrative   Widowed, has 8 children.   Orig from [place], now lives in [place].   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   No tob/alc/drugs.    Family History  Problem Relation Age of Onset   Melanoma Mother    Stomach cancer Father    Lymphoma Sister    Diabetes Daughter     ROS- All systems are reviewed and negative except as per the HPI above  Physical Exam: Vitals:   08/01/18 0842  BP: (!) 152/84  Pulse: 69  Weight: 94.8 kg  Height: '5\' 6"'  (1.676 m)    GEN- The patient is well appearing, alert and oriented x 3 today.   Head- normocephalic, atraumatic Eyes-  Sclera clear, conjunctiva pink Ears- hearing intact Oropharynx- clear Neck- supple, no JVP Lymph- no cervical lymphadenopathy Lungs- Clear to ausculation bilaterally, normal work of breathing Heart-  regular rate and rhythm, no murmurs, rubs or gallops, PMI not laterally displaced GI- soft, NT, ND, + BS Extremities- no clubbing, cyanosis, 2+  Edema to knee area MS- no significant deformity or atrophy Skin- no rash or lesion Psych- euthymic mood, full affect Neuro- strength and sensation are intact  EKG- Sinus rhythm at 69  bpm with a LBBB , PR int 220 ms, qrs int 1342 ms, qtc 503 ms (  will message Dr. Ellyn Hack to have him review EKG ) Epic records reviewed. Labs reviewed   Assessment and Plan: 1 Afib Maintaining SR on 100 mg amiodarone Off BB for some time now  for bradycardia Continue apixaban for chadsvasc score of at least 4  Tsh checked last in April 2020, with stable thyroid per pcp and normal liver enzymes, creatinine stable as well at 0.86  2.  Diastolic heart failure Worse shortness of breath and LLE for the last 3 weeks Increase lasix to 60 mg bid Continue usual K+ dose Reduce  water intake to no more than 6 (8) glasses a day  Try knee high compression socks Avoid salt  Elevate legs when sitting  3. Non Hodgkns B cell lymphoma S/p chemotherapy Per oncology  Return in 7 days for recheck Plan on cmet, cbc, tsh on return If not improved, will update echo and get f/u with Dr. Ellyn Hack in 3 months Will need yearly PFT's as well  Butch Penny C. Issabella Rix, [facility] [address] [place], [place] [address] [phone]

## 2018-08-01 NOTE — Telephone Encounter (Signed)
LVM for patient to call and schedule a 3 month followup with Dr. Ellyn Hack.

## 2018-08-08 ENCOUNTER — Ambulatory Visit (HOSPITAL_COMMUNITY)
Admission: RE | Admit: 2018-08-08 | Discharge: 2018-08-08 | Disposition: A | Discharge status: Home / Self Care | Payer: PPO | Source: Ambulatory Visit | Attending: Nurse Practitioner | Admitting: Nurse Practitioner

## 2018-08-08 ENCOUNTER — Other Ambulatory Visit: Payer: Self-pay

## 2018-08-08 VITALS — BP 140/82 | Ht 66.0 in | Wt 206.0 lb

## 2018-08-08 DIAGNOSIS — I480 Paroxysmal atrial fibrillation: Secondary | ICD-10-CM | POA: Diagnosis not present

## 2018-08-08 DIAGNOSIS — Z8572 Personal history of non-Hodgkin lymphomas: Secondary | ICD-10-CM | POA: Diagnosis not present

## 2018-08-08 DIAGNOSIS — E669 Obesity, unspecified: Secondary | ICD-10-CM | POA: Insufficient documentation

## 2018-08-08 DIAGNOSIS — Z79899 Other long term (current) drug therapy: Secondary | ICD-10-CM | POA: Insufficient documentation

## 2018-08-08 DIAGNOSIS — Z91041 Radiographic dye allergy status: Secondary | ICD-10-CM | POA: Insufficient documentation

## 2018-08-08 DIAGNOSIS — I130 Hypertensive heart and chronic kidney disease with heart failure and stage 1 through stage 4 chronic kidney disease, or unspecified chronic kidney disease: Secondary | ICD-10-CM | POA: Diagnosis not present

## 2018-08-08 DIAGNOSIS — Z79890 Hormone replacement therapy (postmenopausal): Secondary | ICD-10-CM | POA: Insufficient documentation

## 2018-08-08 DIAGNOSIS — Z6833 Body mass index (BMI) 33.0-33.9, adult: Secondary | ICD-10-CM | POA: Insufficient documentation

## 2018-08-08 DIAGNOSIS — N183 Chronic kidney disease, stage 3 (moderate): Secondary | ICD-10-CM | POA: Insufficient documentation

## 2018-08-08 DIAGNOSIS — R7301 Impaired fasting glucose: Secondary | ICD-10-CM | POA: Diagnosis not present

## 2018-08-08 DIAGNOSIS — D638 Anemia in other chronic diseases classified elsewhere: Secondary | ICD-10-CM | POA: Insufficient documentation

## 2018-08-08 DIAGNOSIS — I5032 Chronic diastolic (congestive) heart failure: Secondary | ICD-10-CM | POA: Insufficient documentation

## 2018-08-08 DIAGNOSIS — Z7901 Long term (current) use of anticoagulants: Secondary | ICD-10-CM | POA: Diagnosis not present

## 2018-08-08 LAB — CBC WITH DIFFERENTIAL/PLATELET
Abs Immature Granulocytes: 0.01 10*3/uL (ref 0.00–0.07)
Basophils Absolute: 0 10*3/uL (ref 0.0–0.1)
Basophils Relative: 0 %
Eosinophils Absolute: 0.1 10*3/uL (ref 0.0–0.5)
Eosinophils Relative: 2 %
HCT: 40.7 % (ref 36.0–46.0)
Hemoglobin: 13.3 g/dL (ref 12.0–15.0)
Immature Granulocytes: 0 %
Lymphocytes Relative: 31 %
Lymphs Abs: 1.1 10*3/uL (ref 0.7–4.0)
MCH: 30.7 pg (ref 26.0–34.0)
MCHC: 32.7 g/dL (ref 30.0–36.0)
MCV: 94 fL (ref 80.0–100.0)
Monocytes Absolute: 0.4 10*3/uL (ref 0.1–1.0)
Monocytes Relative: 11 %
Neutro Abs: 2 10*3/uL (ref 1.7–7.7)
Neutrophils Relative %: 56 %
Platelets: 136 10*3/uL — ABNORMAL LOW (ref 150–400)
RBC: 4.33 MIL/uL (ref 3.87–5.11)
RDW: 13.3 % (ref 11.5–15.5)
WBC: 3.5 10*3/uL — ABNORMAL LOW (ref 4.0–10.5)
nRBC: 0 % (ref 0.0–0.2)

## 2018-08-08 LAB — BASIC METABOLIC PANEL
Anion gap: 10 (ref 5–15)
BUN: 21 mg/dL (ref 8–23)
CO2: 32 mmol/L (ref 22–32)
Calcium: 9 mg/dL (ref 8.9–10.3)
Chloride: 99 mmol/L (ref 98–111)
Creatinine, Ser: 1.09 mg/dL — ABNORMAL HIGH (ref 0.44–1.00)
GFR calc Af Amer: 54 mL/min — ABNORMAL LOW (ref >60)
GFR calc non Af Amer: 47 mL/min — ABNORMAL LOW (ref >60)
Glucose, Bld: 115 mg/dL — ABNORMAL HIGH (ref 70–99)
Potassium: 3.8 mmol/L (ref 3.5–5.1)
Sodium: 141 mmol/L (ref 135–145)

## 2018-08-08 MED ORDER — FUROSEMIDE 40 MG PO TABS: 60.0000 mg | ORAL_TABLET | Freq: Two times a day (BID) | ORAL | 1 refills | Status: DC

## 2018-08-08 NOTE — Progress Notes (Signed)
In for BP, labs and weight after increasing lasix to 60 mg bid. She reports feeling a world of difference. She is back to sleeping in her bed. Has less exertional dyspnea and the ankle edema has resolved . Her weight is down 3 lbs today. Bmet/cbc drawn today. F/u with Dr. Ellyn Hack in October and with the afib clinic in February .

## 2018-08-08 NOTE — Progress Notes (Signed)
LMOM

## 2018-08-22 ENCOUNTER — Other Ambulatory Visit (HOSPITAL_COMMUNITY): Payer: PPO | Admitting: Nurse Practitioner

## 2018-08-23 ENCOUNTER — Other Ambulatory Visit: Payer: Self-pay

## 2018-08-23 ENCOUNTER — Ambulatory Visit (HOSPITAL_COMMUNITY)
Admission: RE | Admit: 2018-08-23 | Discharge: 2018-08-23 | Disposition: A | Discharge status: Home / Self Care | Payer: PPO | Source: Ambulatory Visit | Attending: Nurse Practitioner | Admitting: Nurse Practitioner

## 2018-08-23 DIAGNOSIS — I480 Paroxysmal atrial fibrillation: Secondary | ICD-10-CM

## 2018-08-23 LAB — BASIC METABOLIC PANEL
Anion gap: 11 (ref 5–15)
BUN: 15 mg/dL (ref 8–23)
CO2: 31 mmol/L (ref 22–32)
Calcium: 9 mg/dL (ref 8.9–10.3)
Chloride: 100 mmol/L (ref 98–111)
Creatinine, Ser: 0.98 mg/dL (ref 0.44–1.00)
GFR calc Af Amer: >60 mL/min (ref >60)
GFR calc non Af Amer: 53 mL/min — ABNORMAL LOW (ref >60)
Glucose, Bld: 110 mg/dL — ABNORMAL HIGH (ref 70–99)
Potassium: 3.8 mmol/L (ref 3.5–5.1)
Sodium: 142 mmol/L (ref 135–145)

## 2018-08-29 ENCOUNTER — Other Ambulatory Visit: Payer: Self-pay | Admitting: Family Medicine

## 2018-09-02 ENCOUNTER — Other Ambulatory Visit: Payer: Self-pay | Admitting: Cardiology

## 2018-09-02 NOTE — Telephone Encounter (Signed)
Please advise if ok to refill Amiodarone 200 mg tablet. Medication last filled by Roderic Palau, NP.

## 2018-10-10 ENCOUNTER — Other Ambulatory Visit: Payer: Self-pay | Admitting: Cardiology

## 2018-10-10 ENCOUNTER — Other Ambulatory Visit: Payer: Self-pay | Admitting: Family Medicine

## 2018-10-10 NOTE — Telephone Encounter (Signed)
RF request for Ultram 50mg  tablet LOV: 05/10/2018 Next ov: 11/04/2018 Last written: 06/03/2018 #60 x 2 RF.  Please advise, thank you

## 2018-10-14 ENCOUNTER — Inpatient Hospital Stay: Payer: PPO | Attending: Oncology

## 2018-10-14 ENCOUNTER — Other Ambulatory Visit: Payer: Self-pay

## 2018-10-14 ENCOUNTER — Telehealth: Payer: Self-pay | Admitting: Oncology

## 2018-10-14 ENCOUNTER — Inpatient Hospital Stay (HOSPITAL_BASED_OUTPATIENT_CLINIC_OR_DEPARTMENT_OTHER): Payer: PPO | Admitting: Oncology

## 2018-10-14 VITALS — BP 173/73 | HR 81 | Temp 98.7°F | Resp 20 | Ht 66.0 in | Wt 206.8 lb

## 2018-10-14 DIAGNOSIS — Z23 Encounter for immunization: Secondary | ICD-10-CM

## 2018-10-14 DIAGNOSIS — C8200 Follicular lymphoma grade I, unspecified site: Secondary | ICD-10-CM | POA: Diagnosis not present

## 2018-10-14 DIAGNOSIS — M7989 Other specified soft tissue disorders: Secondary | ICD-10-CM

## 2018-10-14 LAB — CBC WITH DIFFERENTIAL (CANCER CENTER ONLY)
Abs Immature Granulocytes: 0.01 10*3/uL (ref 0.00–0.07)
Basophils Absolute: 0 10*3/uL (ref 0.0–0.1)
Basophils Relative: 1 %
Eosinophils Absolute: 0.1 10*3/uL (ref 0.0–0.5)
Eosinophils Relative: 2 %
HCT: 38.7 % (ref 36.0–46.0)
Hemoglobin: 12.9 g/dL (ref 12.0–15.0)
Immature Granulocytes: 0 %
Lymphocytes Relative: 26 %
Lymphs Abs: 1 10*3/uL (ref 0.7–4.0)
MCH: 31.6 pg (ref 26.0–34.0)
MCHC: 33.3 g/dL (ref 30.0–36.0)
MCV: 94.9 fL (ref 80.0–100.0)
Monocytes Absolute: 0.4 10*3/uL (ref 0.1–1.0)
Monocytes Relative: 11 %
Neutro Abs: 2.2 10*3/uL (ref 1.7–7.7)
Neutrophils Relative %: 60 %
Platelet Count: 129 10*3/uL — ABNORMAL LOW (ref 150–400)
RBC: 4.08 MIL/uL (ref 3.87–5.11)
RDW: 13.4 % (ref 11.5–15.5)
WBC Count: 3.8 10*3/uL — ABNORMAL LOW (ref 4.0–10.5)
nRBC: 0 % (ref 0.0–0.2)

## 2018-10-14 MED ORDER — INFLUENZA VAC A&B SA ADJ QUAD 0.5 ML IM PRSY
PREFILLED_SYRINGE | INTRAMUSCULAR | Status: AC
  Filled 2018-10-14: qty 0.5

## 2018-10-14 MED ORDER — INFLUENZA VAC A&B SA ADJ QUAD 0.5 ML IM PRSY
0.5000 mL | PREFILLED_SYRINGE | Freq: Once | INTRAMUSCULAR | Status: AC
  Administered 2018-10-14: 0.5 mL via INTRAMUSCULAR

## 2018-10-14 MED ORDER — INFLUENZA VAC A&B SA ADJ QUAD 0.5 ML IM PRSY: 0.5000 mL | PREFILLED_SYRINGE | Freq: Once | INTRAMUSCULAR | Status: DC

## 2018-10-14 NOTE — Telephone Encounter (Signed)
Scheduled appt per 9/21 los.  Printed calendar and avs,

## 2018-10-14 NOTE — Progress Notes (Signed)
  [place] OFFICE PROGRESS NOTE   Diagnosis: Non-Hodgkin's lymphoma  INTERVAL HISTORY:   Amanda Sellers returns as scheduled.  She feels well.  Good appetite.  No fever or night sweats.  No palpable lymph nodes.  She reports leg swelling improved with an increase in the Lasix dose.  Objective:  Vital signs in last 24 hours:  Blood pressure (!) 173/73, pulse 81, temperature 98.7 F (37.1 C), resp. rate 20, height '5\' 6"'$  (1.676 m), weight 206 lb 12.8 oz (93.8 kg), SpO2 98 %.   Limited physical examination secondary to distancing with the COVID pandemic HEENT: Neck without mass Lymphatics: No cervical, supraclavicular, axillary, or inguinal nodes GI: No hepatosplenomegaly, no mass Vascular: No leg edema     Lab Results:  Lab Results  Component Value Date   WBC 3.8 (L) 10/14/2018   HGB 12.9 10/14/2018   HCT 38.7 10/14/2018   MCV 94.9 10/14/2018   PLT 129 (L) 10/14/2018   NEUTROABS 2.2 10/14/2018    CMP  Lab Results  Component Value Date   NA 142 08/23/2018   K 3.8 08/23/2018   CL 100 08/23/2018   CO2 31 08/23/2018   GLUCOSE 110 (H) 08/23/2018   BUN 15 08/23/2018   CREATININE 0.98 08/23/2018   CALCIUM 9.0 08/23/2018   PROT 6.5 05/17/2018   ALBUMIN 4.4 05/17/2018   AST 21 05/17/2018   ALT 23 05/17/2018   ALKPHOS 46 05/17/2018   BILITOT 0.6 05/17/2018   GFRNONAA 53 (L) 08/23/2018   GFRAA >60 08/23/2018      Medications: I have reviewed the patient's current medications.   Assessment/Plan:  1.Non-Hodgkin's Lymphoma-bone marrow biopsy 07/24/2014 consistent with involvement by follicular B-cell lymphoma, CD20 positive   CTs of the chest 07/22/2014 and CT of the abdomen and pelvis on 07/23/2014-pulmonary nodules, hilar/mediastinal/supraclavicular/axillary adenopathy, splenomegaly, abdominal adenopathy, bilateral adrenal nodules   Cycle 1 bendamustine/rituximab 08/04/2014  Cycle 2 bendamustine/Rituxan 09/01/2014  Cycle 3 bendamustine/Rituxan  09/30/2014  Cycle 4 bendamustine/rituximab 10/27/2014  Restaging CT scans 11/17/2014 with significant improvement in diffuse lymphadenopathy, lung nodules, and renal lesions 2. Severe microcytic anemia, status post transfusion with packed red blood cells 07/23/2014   Normal ferritin, low transferrin saturation, "scant" bone marrow iron stores, review of peripheral blood smear consistent with iron deficiency anemia   colonoscopy and upper endoscopy 09/23/2014 3. Exertional dyspnea /orthopnea-most likely secondary to congestive heart failure  4. History of B-12 deficiency  5. Rash over the trunk and extremities 08/18/2014. Appears consistent with a drug rash. Resolved 08/28/2014.  Rash 11/18/2014, likely secondary to a contrast allergy, diagnostic contrast will be listed as an allergy 6. Colonoscopy 06/03/2008. Medium sized internal hemorrhoids.  7. Admission with atrial flutter with a rapid ventricular response 10/15/2014-status post cardioversion 10/16/2014, maintained on  apixaban  Repeat cardioversion 11/30/2014  8. Right olecranon skin lesion-likely a lipoma or cyst 9. 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  She will return for an office visit in 6 months.  She received an influenza vaccine today.  [name], MD  10/14/2018  8:45 AM

## 2018-11-01 ENCOUNTER — Other Ambulatory Visit: Payer: Self-pay

## 2018-11-01 ENCOUNTER — Encounter: Payer: Self-pay | Admitting: Cardiology

## 2018-11-01 ENCOUNTER — Ambulatory Visit: Payer: PPO | Admitting: Cardiology

## 2018-11-01 VITALS — BP 144/86 | HR 78 | Temp 97.0°F | Ht 66.0 in | Wt 206.0 lb

## 2018-11-01 DIAGNOSIS — I5032 Chronic diastolic (congestive) heart failure: Secondary | ICD-10-CM | POA: Diagnosis not present

## 2018-11-01 DIAGNOSIS — Z79899 Other long term (current) drug therapy: Secondary | ICD-10-CM

## 2018-11-01 DIAGNOSIS — I10 Essential (primary) hypertension: Secondary | ICD-10-CM

## 2018-11-01 DIAGNOSIS — I480 Paroxysmal atrial fibrillation: Secondary | ICD-10-CM

## 2018-11-01 MED ORDER — APIXABAN 5 MG PO TABS: 5.0000 mg | ORAL_TABLET | Freq: Two times a day (BID) | ORAL | 2 refills | Status: DC

## 2018-11-01 NOTE — Progress Notes (Signed)
PCP: Amanda Sou, MD  Clinic Note: Chief Complaint  Patient presents with  . Follow-up    Annual for general cardiology.  . Atrial Fibrillation    Permanent/persistent  . Hypertension  . Congestive Heart Failure    Lasix dose increased for worsening edema and orthopnea    HPI:   Amanda Sellers is a 85 y.o. female with a h/o AF and difficult to control/labile hypertension who presents today for ~ annual f/u/.  Amanda Sellers was last seen by me in October 2019. -->  BP was quite high.  But she was feeling fine.  No evidence of A. fib.  Dyspnea at baseline. --> We stopped the metoprolol and started losartan. She has subsequently been seen by Amanda Palau, NP in the A. fib clinic on August 01, 2018.  Blood pressures 150s over 80s. ->  Still on amiodarone For rhythm control. and apixaban.    Noted some left lower extremity edema.  Sleeping in a recliner. --> Lasix increased to 60 mg BID  Recent Hospitalizations: n/a  Reviewed  CV studies:   The following studies were reviewed today: (if available, images/films reviewed: From Epic Chart or Care Everywhere) . n/a:  Interval History:    Amanda Sellers returns here today noting that ever since her Lasix dose was titrated up to 60 mg twice daily by Amanda Palau, NP in the A. fib clinic, her breathing is notably improved and the orthopnea is also improved.  Swelling is better and she is no longer him to sleep on the recliner.  (She does not recall our discussion from last visit about her potentially being able take 80 mg in the morning along with additional 40 mg in the p.m. if she has worsening edema)  Otherwise, she seems to be doing okay.  She is not having any sensation whatsoever being in A. fib or not.  No longer having the PND and orthopnea.  No chest pain or pressure with rest or exertion.  She does not walk very much, uses a rolling walker.  If she has to go too far she will get short of breath.  She has some positional  dizziness, but not necessarily orthostatic.  Last fall was about 2 years ago when her walker rolled out from underneath her.  No palpitations, lightheadedness, dizziness, weakness or syncope/near syncope. No TIA/amaurosis fugax symptoms. No melena, hematochezia, hematuria, or epstaxis. No easy bleeding or bruising No claudication.  The patient does not have symptoms concerning for COVID-19 infection (fever, chills, cough, or new shortness of breath).  The patient is practicing social distancing.  Son,who lives with her - gets groceries.  Reviewed Of Systems   ROS: A comprehensive was performed. Review of Systems  Constitutional: Negative for malaise/fatigue (Energy better, but just not sleeping well) and weight loss.  HENT: Negative for congestion and nosebleeds.   Respiratory: Negative for cough, shortness of breath (Not unless she exerts) and wheezing.   Cardiovascular: Positive for leg swelling (Stable on current dose Lasix).  Gastrointestinal: Negative for abdominal pain, blood in stool, diarrhea, heartburn and melena.  Genitourinary: Negative for hematuria.  Musculoskeletal: Positive for back pain and joint pain (She says that the bilateral knee injections that she has been getting seem to be wearing off and she may need another injection soon.). Negative for falls (No recent falls.).  Neurological: Positive for dizziness (Poor balance) and weakness (Generalized, mostly legs). Negative for focal weakness and headaches.  Psychiatric/Behavioral: Negative for depression. The patient is  nervous/anxious (Does not acknowledge being anxious, but sounds anxious) and has insomnia (Sleeping issues worry her the most -wakes up about every hour or so - not PND.  ).   All other systems reviewed and are negative.   I have reviewed and (if needed) personally updated the patient's problem list, medications, allergies, past medical and surgical history, social and family history.   Past Medical  History   Past Medical History:  Diagnosis Date  . Anemia of chronic disease   . Anticoagulation adequate, Eliquis 10/16/2014  . Anxiety    clonaz helps this AND her breathing  . Chronic diastolic congestive heart failure ([place]) 26/20/3559   Complicated by atrial fibrillation; Echo 07/23/14: mild LVH, EF 50-55%, grade 2 diast dysfxn.  NYHA class II.  Marland Kitchen Chronic renal insufficiency, stage III (moderate)    GFR @ 50  . COPD (chronic obstructive pulmonary disease) ([place])    recently noted on Xray, does not have any problems  . Family history of adverse reaction to anesthesia    daughter has difficulty waking up  . Follicular lymphoma ([place])    Non Hodgkins B cell lymphoma; s/p chemo summer 2016--in remission still as of 04/2018: continue obs q 35mo . HLD (hyperlipidemia)     06/2014  . HTN (hypertension)   . Hypothyroidism   . IFG (impaired fasting glucose)   . Lip cancer    Dr. BJanace Hoardexcised this: invasive SCC--no sign of cancer at ENT f/u 10/2015  . Microcytic anemia    transfused 3 U total in hosp 07/2014  . Obesity (BMI 30-39.9)   . Olecranon bursitis of right elbow 02/2015  . [place]oplastic [place]   . Persistent atrial fibrillation ([facility])    With RVR; elec cardioversion 11/30/14.  On Amio since 10/2014; Dr. NLenna Gilfordfollowing her from pulm standpoint regarding this med.  .Marland KitchenPositive occult stool blood test 08/08/14   Endoscopies ok 08/2014  . Primary osteoarthritis of both knees 04/2016   Dr. GGladstone Lighter bone on bone--to get euflexxa to both knees.  . [place]monary [place] 07/24/2014  . Shortness of breath dyspnea    with exertion.  Dr. NLenna Gilfordstarted her on clonazepam to try to relax her chest wall and allow more satisfying deep breath. (03/2016).  . Vitamin B 12 deficiency   . Vitamin D deficiency    dose increased to 50 K U tab TWICE weekly 04/2017     Past Surgical History   Past Surgical History:  Procedure Laterality Date  . ABDOMINAL HYSTERECTOMY   1978  . BONE MARROW BIOPSY  07/24/14  . BREAST BIOPSY    . CARDIOVERSION N/A 10/16/2014   Procedure: CARDIOVERSION;  Surgeon: BLelon Perla MD;  [facility];  Service: Cardiovascular;  Laterality: N/A;  . CARDIOVERSION N/A 11/30/2014   Procedure: CARDIOVERSION;  Surgeon: DLarey Dresser MD;  [facility];  Service: Cardiovascular;  Laterality: N/A;  . COLONOSCOPY  09/23/14   small hemorrhoids, otherwise normal (performed for IDA and heme+ stool)  . COLONOSCOPY    . MASS EXCISION Left 07/15/2015   Left lower lip mass--invasive SCC w/negative margins.  Procedure: EXCISION MASS;  Surgeon: JMelissa Montane MD;  [place]  Service: ENT;  Laterality: Left;  Wedge excision left lower lip mass  . NM MYOVIEW LTD  10/29/2014   medium size mild surgery defect in the mid anterior and apical anterior location suggestive of breast attenuation. No reversibility. LOW RISK  . PFTs  02/2015  Restriction with diffusion defect: cardiologist referred her to pulm to help interpret PFTs and decide whether she has amiodarone toxicity  . TEE WITHOUT CARDIOVERSION N/A 10/16/2014   Procedure: TRANSESOPHAGEAL ECHOCARDIOGRAM (TEE);  Surgeon: Lelon Perla, MD;  [facility];  Service: Cardiovascular;  Laterality: N/A;  . TRANSTHORACIC ECHOCARDIOGRAM  07/23/14   mild LVH, EF 50-55%, grade 2 diast dysfxn  . UPPER GI ENDOSCOPY  09/23/2014   small hiatus hernia, nodules in stomach biopsied (chronic active erosive atrophic gastritis with intestinal metaplasia--no dysplasia or malignancy) otherwise normal     Medications/Allergies   Current Meds  Medication Sig  . ALPRAZolam (XANAX) 0.5 MG tablet 1 tab po qhs prn anxiety-related insomnia  . amiodarone (PACERONE) 200 MG tablet TAKE ONE TABLET BY MOUTH EVERY DAY.  Marland Kitchen apixaban (ELIQUIS) 5 MG TABS tablet Take 1 tablet (5 mg total) by mouth 2 (two) times daily.  . clonazePAM (KLONOPIN) 0.5 MG tablet TAKE 1/2 TO 1 TABLET BY MOUTH TWICE DAILY  AS NEEDED  . cyanocobalamin (,VITAMIN B-12,) 1000 MCG/ML injection INJECT 1 ML (1000 MCG TOTAL) INTO THE SKIN EVERY 30 DAYS.  . ferrous sulfate 325 (65 FE) MG tablet Take 1 tablet (325 mg total) by mouth 2 (two) times daily with a meal.  . fish oil-omega-3 fatty acids 1000 MG capsule Take 2 g by mouth daily.  . furosemide (LASIX) 40 MG tablet Take 1.5 tablets (60 mg total) by mouth 2 (two) times daily.  Marland Kitchen levothyroxine (SYNTHROID) 100 MCG tablet TAKE 1 TABLET BY MOUTH EVERY DAY BEFORE BREAKFAST  . OXYGEN Inhale into the lungs. 1Lt at bedtime  . potassium chloride 20 MEQ TBCR Take 20 mEq by mouth daily.  . pravastatin (PRAVACHOL) 80 MG tablet TAKE 1 TABLET BY MOUTH EVERY DAY  . traMADol (ULTRAM) 50 MG tablet TAKE 1 TABLET BY MOUTH TWICE A DAY AS NEEDED FOR PAIN  . valsartan (DIOVAN) 40 MG tablet TAKE 1 TABLET BY MOUTH EVERY DAY  . Vitamin D, Ergocalciferol, (DRISDOL) 1.25 MG (50000 UT) CAPS capsule TAKE 1 CAPSULE (50,000 UNITS TOTAL) BY MOUTH 2 (TWO) TIMES A WEEK.  . [DISCONTINUED] ELIQUIS 5 MG TABS tablet TAKE 1 TABLET BY MOUTH TWICE A DAY    Allergies  Allergen Reactions  . Iodinated Diagnostic Agents Rash and Other (See Comments)    CT Contrast    Social History/Family History   Social History   Tobacco Use  . Smoking status: Never Smoker  . Smokeless tobacco: Never Used  Substance Use Topics  . Alcohol use: No    Alcohol/week: 0.0 standard drinks  . Drug use: No   Social History   Social History Narrative   Widowed, has 8 children.   Orig from [place], now lives in [place].   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   No tob/alc/drugs.    family history includes Diabetes in her daughter; Lymphoma in her sister; Melanoma in her mother; Stomach cancer in her father.  Wt Readings from Last 3 Encounters:  11/01/18 206 lb (93.4 kg)  10/14/18 206 lb 12.8 oz (93.8 kg)  08/08/18 206 lb (93.4 kg)     Objctive   Physical Exam:  BP (!) 144/86   Pulse 78   Temp (!) 97 F (36.1 C)   Ht '5\' 6"'  (1.676 m)   Wt 206 lb (93.4 kg)   SpO2 93%   BMI 33.25 kg/m For her this current blood pressure is good Physical Exam  Constitutional: She is oriented to person, place, and time. Vital signs are normal. She appears well-developed and well-nourished.  Very pleasant elderly woman.  She seems a little bit nervous and anxious, but no acute distress.  Well-groomed.  Mildly obese.  HENT:  Head: Normocephalic and atraumatic.  Neck: Normal range of motion. Neck supple. No hepatojugular reflux and no JVD present. Carotid bruit is not present.  Cardiovascular: Normal rate and normal heart sounds. An irregular rhythm present. PMI is not displaced. Exam reveals decreased pulses (Mildly diminished bilateral pedal pulses). Exam reveals no gallop and no friction rub.  No murmur heard. Split S2  Pulmonary/Chest: Effort normal and breath sounds normal. No respiratory distress. She has no wheezes. She has no rales.  Abdominal: Soft. Bowel sounds are normal. She exhibits no distension. There is no abdominal tenderness. There is no rebound.  Musculoskeletal: Normal range of motion.        General: Edema (Trivial bilateral LE) present.     Comments: Walks with a rolling walker  Neurological: She is alert and oriented to person, place, and time.  Mild tremor.  Psychiatric: She has a normal mood and affect. Her behavior is normal. Judgment and thought content normal.  Vitals reviewed.    Adult ECG Report  Rate: 70-72;  Rhythm: Difficult to assess rhythm because of baseline artifact.  Appears to be sinus bradycardia with prolonged first-degree AV block.  LBBB with T wave abnormalities noted.;  Rightward axis.  Narrative Interpretation: Without the artifact, the EKG looks pretty much the same as before.  Recent Labs: From April 2020 (due to have recheck on November 04, 2018)  TC 221, TG 217, HDL 61, LDL 106.  A1c 5.3.  Hgb 12.9. Cr 0.98, K+ 3.8.   TSH 5.57.   Assessment / Plan    Problem List Items Addressed This Visit    Paroxysmal atrial fibrillation with RVR ([place]): CHA2DS2-VASc Score 4 (Chronic)    She has been relatively stable maintaining sinus rhythm on amiodarone.  Less symptomatic with being in sinus rhythm. We talked importance of having annual follow-up eye exams. I think we can hold off pulmonary function tests unless she has worsening pulmonary symptoms.  Doing well on current dose of Eliquis.      Relevant Medications   apixaban (ELIQUIS) 5 MG TABS tablet   Other Relevant Orders   EKG 12-Lead   Chronic diastolic CHF (congestive heart failure), NYHA class 2 ([place]) - Primary (Chronic)    She had a little worsening edema and orthopnea when seen in the A. fib clinic.  Standing dose of Lasix was increased to 60 mg twice daily and she is now doing better.  My plan was for her to take 80 mg in the morning and 40 in the afternoon to avoid extra urination at night.  However she is stable now on the Aleve alone.  I did tell her that she can take additional doses (taking 80 mg in the morning and 60 mg at night if she has worsening edema.  She is on ARB as standing dose, but we have discontinued Toprol because of bradycardia with her being on amiodarone.  Somehow her ARB dose was reduced from 80 mg down to 40 mg.      Relevant Medications   apixaban (ELIQUIS) 5 MG TABS tablet   Other Relevant Orders   EKG 12-Lead   Essential hypertension (Chronic)    She continues to be hypertensive and we did have to stop her Toprol because  of bradycardia.   My hope was that with her hypertension, her ARB would have been increased to 80 mg, but it was not done. As her pressures seem to be okay right now we will leave it alone, but neck step would be to increase Diovan to 80 mg daily.      Relevant Medications   apixaban (ELIQUIS) 5 MG TABS tablet   On amiodarone therapy (Chronic)    It looks like her pulmonary disease is been turned  over to her PCP.  If there is concerning pulmonary symptoms I think checking PFTs would be reasonable, but we are using amiodarone for destination therapy.         COVID-19 Education: The signs and symptoms of COVID-19 were discussed with the patient and how to seek care for testing (follow up with PCP or arrange E-visit).   The importance of social distancing was discussed today.  I spent a total of 18 minutes with the patient and chart review. >  50% of the time was spent in direct patient consultation.  Additional time spent with chart review (studies, outside notes, etc): 8 Total Time: 26 min   Current medicines are reviewed at length with the patient today.  (+/- concerns) n/a   Patient Instructions / Medication Changes & Studies & Tests Ordered   Patient Instructions  Medication Instructions:  Do not hesitate to use an extra dose of Lasix ( furosemide) if you have swelling   If you need a refill on your cardiac medications before your next appointment, please call your pharmacy.   Lab work:  Not needed  Testing/Procedures:  Not needed  Follow-Up: At [facility], you and your health needs are our priority.  As part of our continuing mission to provide you with exceptional heart care, we have created designated Provider Care Teams.  These Care Teams include your primary Cardiologist (physician) and Advanced Practice Providers (APPs -  Physician Assistants and Nurse Practitioners) who all work together to provide you with the care you need, when you need it. . You will need a follow up appointment in  12 months- Oct 2021.  Please call our office 2 months in advance to schedule this appointment.  You may see Glenetta Hew, MD or one of the following Advanced Practice Providers on your designated Care Team:   . Rosaria Ferries, PA-C . Jory Sims, DNP, ANP  Any Other Special Instructions Will Be Listed Below.    Studies Ordered:   Orders Placed This Encounter   Procedures  . EKG 12-Lead     Glenetta Hew, M.D., M.S. Interventional Cardiologist   Pager # [phone] Phone # [phone] [address] [place], [place] [address]   Thank you for choosing Heartcare at [facility]!!

## 2018-11-01 NOTE — Patient Instructions (Addendum)
Medication Instructions:  Do not hesitate to use an extra dose of Lasix ( furosemide) if you have swelling   If you need a refill on your cardiac medications before your next appointment, please call your pharmacy.   Lab work:  Not needed  Testing/Procedures:  Not needed  Follow-Up: At [facility], you and your health needs are our priority.  As part of our continuing mission to provide you with exceptional heart care, we have created designated Provider Care Teams.  These Care Teams include your primary Cardiologist (physician) and Advanced Practice Providers (APPs -  Physician Assistants and Nurse Practitioners) who all work together to provide you with the care you need, when you need it. . You will need a follow up appointment in  12 months- Oct 2021.  Please call our office 2 months in advance to schedule this appointment.  You may see Glenetta Hew, MD or one of the following Advanced Practice Providers on your designated Care Team:   . Rosaria Ferries, PA-C . Jory Sims, DNP, ANP  Any Other Special Instructions Will Be Listed Below.

## 2018-11-03 ENCOUNTER — Encounter: Payer: Self-pay | Admitting: Cardiology

## 2018-11-03 NOTE — Assessment & Plan Note (Signed)
She continues to be hypertensive and we did have to stop her Toprol because of bradycardia.   My hope was that with her hypertension, her ARB would have been increased to 80 mg, but it was not done. As her pressures seem to be okay right now we will leave it alone, but neck step would be to increase Diovan to 80 mg daily.

## 2018-11-03 NOTE — Assessment & Plan Note (Addendum)
She has been relatively stable maintaining sinus rhythm on amiodarone.  Less symptomatic with being in sinus rhythm. We talked importance of having annual follow-up eye exams. I think we can hold off pulmonary function tests unless she has worsening pulmonary symptoms.  Doing well on current dose of Eliquis.

## 2018-11-03 NOTE — Assessment & Plan Note (Signed)
It looks like her pulmonary disease is been turned over to her PCP.  If there is concerning pulmonary symptoms I think checking PFTs would be reasonable, but we are using amiodarone for destination therapy.

## 2018-11-03 NOTE — Assessment & Plan Note (Addendum)
She had a little worsening edema and orthopnea when seen in the A. fib clinic.  Standing dose of Lasix was increased to 60 mg twice daily and she is now doing better.  My plan was for her to take 80 mg in the morning and 40 in the afternoon to avoid extra urination at night.  However she is stable now on the Aleve alone.  I did tell her that she can take additional doses (taking 80 mg in the morning and 60 mg at night if she has worsening edema.  She is on ARB as standing dose, but we have discontinued Toprol because of bradycardia with her being on amiodarone.  Somehow her ARB dose was reduced from 80 mg down to 40 mg.

## 2018-11-04 ENCOUNTER — Encounter: Payer: Self-pay | Admitting: Family Medicine

## 2018-11-04 ENCOUNTER — Other Ambulatory Visit: Payer: Self-pay

## 2018-11-04 ENCOUNTER — Ambulatory Visit (INDEPENDENT_AMBULATORY_CARE_PROVIDER_SITE_OTHER): Payer: PPO | Admitting: Family Medicine

## 2018-11-04 ENCOUNTER — Telehealth: Payer: Self-pay | Admitting: Family Medicine

## 2018-11-04 VITALS — BP 164/81 | HR 73 | Temp 98.1°F | Resp 16 | Ht 64.0 in | Wt 205.8 lb

## 2018-11-04 DIAGNOSIS — F411 Generalized anxiety disorder: Secondary | ICD-10-CM | POA: Diagnosis not present

## 2018-11-04 DIAGNOSIS — R7301 Impaired fasting glucose: Secondary | ICD-10-CM | POA: Diagnosis not present

## 2018-11-04 DIAGNOSIS — E039 Hypothyroidism, unspecified: Secondary | ICD-10-CM

## 2018-11-04 DIAGNOSIS — H5789 Other specified disorders of eye and adnexa: Secondary | ICD-10-CM | POA: Diagnosis not present

## 2018-11-04 DIAGNOSIS — E559 Vitamin D deficiency, unspecified: Secondary | ICD-10-CM

## 2018-11-04 DIAGNOSIS — Z79899 Other long term (current) drug therapy: Secondary | ICD-10-CM | POA: Diagnosis not present

## 2018-11-04 DIAGNOSIS — E7800 Pure hypercholesterolemia, unspecified: Secondary | ICD-10-CM | POA: Diagnosis not present

## 2018-11-04 DIAGNOSIS — Z0000 Encounter for general adult medical examination without abnormal findings: Secondary | ICD-10-CM

## 2018-11-04 DIAGNOSIS — I5032 Chronic diastolic (congestive) heart failure: Secondary | ICD-10-CM

## 2018-11-04 DIAGNOSIS — Z23 Encounter for immunization: Secondary | ICD-10-CM

## 2018-11-04 LAB — CBC WITH DIFFERENTIAL/PLATELET
Basophils Absolute: 0 10*3/uL (ref 0.0–0.1)
Basophils Relative: 1.1 % (ref 0.0–3.0)
Eosinophils Absolute: 0.1 10*3/uL (ref 0.0–0.7)
Eosinophils Relative: 2.1 % (ref 0.0–5.0)
HCT: 37 % (ref 36.0–46.0)
Hemoglobin: 12.5 g/dL (ref 12.0–15.0)
Lymphocytes Relative: 27 % (ref 12.0–46.0)
Lymphs Abs: 0.8 10*3/uL (ref 0.7–4.0)
MCHC: 33.9 g/dL (ref 30.0–36.0)
MCV: 91.8 fl (ref 78.0–100.0)
Monocytes Absolute: 0.4 10*3/uL (ref 0.1–1.0)
Monocytes Relative: 11.5 % (ref 3.0–12.0)
Neutro Abs: 1.8 10*3/uL (ref 1.4–7.7)
Neutrophils Relative %: 58.3 % (ref 43.0–77.0)
Platelets: 129 10*3/uL — ABNORMAL LOW (ref 150.0–400.0)
RBC: 4.03 Mil/uL (ref 3.87–5.11)
RDW: 13.5 % (ref 11.5–15.5)
WBC: 3.1 10*3/uL — ABNORMAL LOW (ref 4.0–10.5)

## 2018-11-04 LAB — COMPREHENSIVE METABOLIC PANEL
ALT: 21 U/L (ref 0–35)
AST: 25 U/L (ref 0–37)
Albumin: 4.4 g/dL (ref 3.5–5.2)
Alkaline Phosphatase: 55 U/L (ref 39–117)
BUN: 15 mg/dL (ref 6–23)
CO2: 34 mEq/L — ABNORMAL HIGH (ref 19–32)
Calcium: 9.5 mg/dL (ref 8.4–10.5)
Chloride: 100 mEq/L (ref 96–112)
Creatinine, Ser: 0.88 mg/dL (ref 0.40–1.20)
GFR: 61.18 mL/min (ref >60.00)
Glucose, Bld: 110 mg/dL — ABNORMAL HIGH (ref 70–99)
Potassium: 3.9 mEq/L (ref 3.5–5.1)
Sodium: 142 mEq/L (ref 135–145)
Total Bilirubin: 0.8 mg/dL (ref 0.2–1.2)
Total Protein: 6.4 g/dL (ref 6.0–8.3)

## 2018-11-04 LAB — LIPID PANEL
Cholesterol: 209 mg/dL — ABNORMAL HIGH (ref 0–200)
HDL: 62.7 mg/dL (ref >39.00)
LDL Cholesterol: 107 mg/dL — ABNORMAL HIGH (ref 0–99)
NonHDL: 146.07
Total CHOL/HDL Ratio: 3
Triglycerides: 196 mg/dL — ABNORMAL HIGH (ref 0.0–149.0)
VLDL: 39.2 mg/dL (ref 0.0–40.0)

## 2018-11-04 LAB — TSH: TSH: 6.15 u[IU]/mL — ABNORMAL HIGH (ref 0.35–4.50)

## 2018-11-04 LAB — HEMOGLOBIN A1C: Hgb A1c MFr Bld: 5.6 % (ref 4.6–6.5)

## 2018-11-04 LAB — VITAMIN D 25 HYDROXY (VIT D DEFICIENCY, FRACTURES): VITD: 50.77 ng/mL (ref 30.00–100.00)

## 2018-11-04 MED ORDER — ZOSTER VAC RECOMB ADJUVANTED 50 MCG/0.5ML IM SUSR: 0.5000 mL | Freq: Once | INTRAMUSCULAR | 0 refills | Status: AC

## 2018-11-04 NOTE — Addendum Note (Signed)
Addended by: Deveron Furlong D on: 11/04/2018 09:50 AM   Modules accepted: Orders

## 2018-11-04 NOTE — Telephone Encounter (Signed)
Contacted pt's daughter and advised reason pt needed referral, will explain to pt.

## 2018-11-04 NOTE — Progress Notes (Signed)
Office Note 11/04/2018  CC:  Chief Complaint  Patient presents with  . Annual Exam    pt is fasting    HPI:  Amanda Sellers is a 85 y.o. White female who is here for annual health maintenance exam.  She always uses a walker for safety. No exercise, but she plans on taking regular walks with assistance of one of her kids. Not working on anything in particular with diet.  She checks bp at home twice a day: 130s/70s consistently.  BP gets up commonly when she is at MD visits.  We reviewed her med list in detail today and she was able to tell me clearly that she takes 1/2 clonazepam every morning to help decrease her anxiety-related tendency to have some SOB. She takes a xanax at bedtime if having trouble sleeping.  Also takes a tramadol for pain, avg of 1 every other day, most of the time at night so she can rest easier with lighter pain.   Past Medical History:  Diagnosis Date  . Anemia of chronic disease   . Anticoagulation adequate, Eliquis 10/16/2014  . Anxiety    clonaz helps this AND her breathing  . Chronic diastolic congestive heart failure ([place]) 91/63/8466   Complicated by atrial fibrillation; Echo 07/23/14: mild LVH, EF 50-55%, grade 2 diast dysfxn.  NYHA class II.  Marland Kitchen Chronic renal insufficiency, stage III (moderate)    GFR @ 50  . COPD (chronic obstructive pulmonary disease) ([place])    recently noted on Xray, does not have any problems  . Family history of adverse reaction to anesthesia    daughter has difficulty waking up  . Follicular lymphoma ([place])    Non Hodgkins B cell lymphoma; s/p chemo summer 2016--in remission still as of 04/2018: continue obs q 84mo . HLD (hyperlipidemia)     06/2014  . HTN (hypertension)   . Hypothyroidism   . IFG (impaired fasting glucose)   . Lip cancer    Dr. BJanace Hoardexcised this: invasive SCC--no sign of cancer at ENT f/u 10/2015  . Microcytic anemia    transfused 3 U total in hosp 07/2014  . Obesity (BMI 30-39.9)   . Olecranon  bursitis of right elbow 02/2015  . [place]oplastic [place]   . Persistent atrial fibrillation ([facility])    With RVR; elec cardioversion 11/30/14.  On Amio since 10/2014; Dr. NLenna Gilfordfollowing her from pulm standpoint regarding this med.  .Marland KitchenPositive occult stool blood test 08/08/14   Endoscopies ok 08/2014  . Primary osteoarthritis of both knees 04/2016   Dr. GGladstone Lighter bone on bone--to get euflexxa to both knees.  . Pulmonary metastases ([place] 07/24/2014  . Shortness of breath dyspnea    with exertion.  Dr. NLenna Gilfordstarted her on clonazepam to try to relax her chest wall and allow more satisfying deep breath. (03/2016).  . Vitamin B 12 deficiency   . Vitamin D deficiency    dose increased to 50 K U tab TWICE weekly 04/2017    Past Surgical History:  Procedure Laterality Date  . ABDOMINAL HYSTERECTOMY  1978  . BONE MARROW BIOPSY  07/24/14  . BREAST BIOPSY    . CARDIOVERSION N/A 10/16/2014   Procedure: CARDIOVERSION;  Surgeon: BLelon Perla MD;  [facility];  Service: Cardiovascular;  Laterality: N/A;  . CARDIOVERSION N/A 11/30/2014   Procedure: CARDIOVERSION;  Surgeon: DLarey Dresser MD;  [place]  Service: Cardiovascular;  Laterality: N/A;  . COLONOSCOPY  09/23/14  small hemorrhoids, otherwise normal (performed for IDA and heme+ stool)  . COLONOSCOPY    . MASS EXCISION Left 07/15/2015   Left lower lip mass--invasive SCC w/negative margins.  Procedure: EXCISION MASS;  Surgeon: Melissa Montane, MD;  Location: [place];  Service: ENT;  Laterality: Left;  Wedge excision left lower lip mass  . NM MYOVIEW LTD  10/29/2014   medium size mild surgery defect in the mid anterior and apical anterior location suggestive of breast attenuation. No reversibility. LOW RISK  . PFTs  02/2015   Restriction with diffusion defect: cardiologist referred her to pulm to help interpret PFTs and decide whether she has amiodarone toxicity  . TEE WITHOUT CARDIOVERSION N/A 10/16/2014    Procedure: TRANSESOPHAGEAL ECHOCARDIOGRAM (TEE);  Surgeon: Lelon Perla, MD;  Location: [facility]COPY;  Service: Cardiovascular;  Laterality: N/A;  . TRANSTHORACIC ECHOCARDIOGRAM  07/23/14   mild LVH, EF 50-55%, grade 2 diast dysfxn  . UPPER GI ENDOSCOPY  09/23/2014   small hiatus hernia, nodules in stomach biopsied (chronic active erosive atrophic gastritis with intestinal metaplasia--no dysplasia or malignancy) otherwise normal    Family History  Problem Relation Age of Onset  . Melanoma Mother   . Stomach cancer Father   . Lymphoma Sister   . Diabetes Daughter     Social History   Socioeconomic History  . Marital status: Married    Spouse name: Not on file  . Number of children: 8  . Years of education: Not on file  . Highest education level: Not on file  Occupational History  . Occupation: retired  Scientific laboratory technician  . Financial resource strain: Not on file  . Food insecurity    Worry: Not on file    Inability: Not on file  . Transportation needs    Medical: Not on file    Non-medical: Not on file  Tobacco Use  . Smoking status: Never Smoker  . Smokeless tobacco: Never Used  Substance and Sexual Activity  . Alcohol use: No    Alcohol/week: 0.0 standard drinks  . Drug use: No  . Sexual activity: Not on file  Lifestyle  . Physical activity    Days per week: Not on file    Minutes per session: Not on file  . Stress: Not on file  Relationships  . Social Herbalist on phone: Not on file    Gets together: Not on file    Attends religious service: Not on file    Active member of club or organization: Not on file    Attends meetings of clubs or organizations: Not on file    Relationship status: Not on file  . Intimate partner violence    Fear of current or ex partner: Not on file    Emotionally abused: Not on file    Physically abused: Not on file    Forced sexual activity: Not on file  Other Topics Concern  . Not on file  Social History Narrative    Widowed, has 8 children.   Orig from [place], now lives in [place].   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   No tob/alc/drugs.    Outpatient Medications Prior to Visit  Medication Sig Dispense Refill  . ALPRAZolam (XANAX) 0.5 MG tablet 1 tab po qhs prn anxiety-related insomnia 30 tablet 0  . amiodarone (PACERONE) 200 MG tablet TAKE ONE TABLET BY MOUTH EVERY DAY. 90 tablet 0  . apixaban (ELIQUIS) 5  MG TABS tablet Take 1 tablet (5 mg total) by mouth 2 (two) times daily. 180 tablet 2  . clonazePAM (KLONOPIN) 0.5 MG tablet TAKE 1/2 TO 1 TABLET BY MOUTH TWICE DAILY AS NEEDED 60 tablet 5  . cyanocobalamin (,VITAMIN B-12,) 1000 MCG/ML injection INJECT 1 ML (1000 MCG TOTAL) INTO THE SKIN EVERY 30 DAYS. 10 mL 0  . ferrous sulfate 325 (65 FE) MG tablet Take 1 tablet (325 mg total) by mouth 2 (two) times daily with a meal. 30 tablet 1  . fish oil-omega-3 fatty acids 1000 MG capsule Take 2 g by mouth daily.    . furosemide (LASIX) 40 MG tablet Take 1.5 tablets (60 mg total) by mouth 2 (two) times daily. 180 tablet 1  . levothyroxine (SYNTHROID) 100 MCG tablet TAKE 1 TABLET BY MOUTH EVERY DAY BEFORE BREAKFAST 90 tablet 1  . OXYGEN Inhale into the lungs. 1Lt at bedtime    . potassium chloride 20 MEQ TBCR Take 20 mEq by mouth daily. 30 tablet 3  . pravastatin (PRAVACHOL) 80 MG tablet TAKE 1 TABLET BY MOUTH EVERY DAY 90 tablet 1  . traMADol (ULTRAM) 50 MG tablet TAKE 1 TABLET BY MOUTH TWICE A DAY AS NEEDED FOR PAIN 60 tablet 2  . valsartan (DIOVAN) 40 MG tablet TAKE 1 TABLET BY MOUTH EVERY DAY 30 tablet 0  . Vitamin D, Ergocalciferol, (DRISDOL) 1.25 MG (50000 UT) CAPS capsule TAKE 1 CAPSULE (50,000 UNITS TOTAL) BY MOUTH 2 (TWO) TIMES A WEEK. 24 capsule 3   No facility-administered medications prior to visit.     Allergies  Allergen Reactions  . Iodinated Diagnostic Agents Rash and Other (See Comments)    CT Contrast    ROS Review of Systems   Constitutional: Negative for appetite change, chills, fatigue and fever.  HENT: Negative for congestion, dental problem, ear pain and sore throat.   Eyes: Negative for discharge, redness and visual disturbance.  Respiratory: Negative for cough, chest tightness, shortness of breath and wheezing.   Cardiovascular: Negative for chest pain, palpitations and leg swelling.  Gastrointestinal: Negative for abdominal pain, blood in stool, diarrhea, nausea and vomiting.  Genitourinary: Negative for difficulty urinating, dysuria, flank pain, frequency, hematuria and urgency.  Musculoskeletal: Positive for arthralgias (chronic, many joints). Negative for back pain, joint swelling, myalgias and neck stiffness.  Skin: Negative for pallor and rash.  Neurological: Negative for dizziness, speech difficulty, weakness and headaches.  Hematological: Negative for adenopathy. Does not bruise/bleed easily.  Psychiatric/Behavioral: Negative for confusion and sleep disturbance. The patient is not nervous/anxious.     PE; Blood pressure (!) 164/81, pulse 73, temperature 98.1 F (36.7 C), temperature source Temporal, resp. rate 16, height '5\' 4"'  (1.626 m), weight 205 lb 12.8 oz (93.4 kg), SpO2 95 %. Body mass index is 35.33 kg/m. Exam chaperoned by Deveron Furlong, CMA. Gen: Alert, well appearing.  Patient is oriented to person, place, time, and situation. AFFECT: pleasant, lucid thought and speech. ENT: Eyes: no injection, icteris, swelling, or exudate.  EOMI, Right eye w/out red reflex.  Left eye RR normal.  Pupils equal and reactive to light. Nose: no drainage or turbinate edema/swelling.  No injection or focal lesion.  Mouth: lips without lesion/swelling.  Oral mucosa pink and moist.  Upper dentures.  No teeth or dentures on bottom. Oropharynx without erythema, exudate, or swelling.  Neck: supple/nontender.  No LAD, mass, or TM.  Carotid pulses 2+ bilaterally, without bruits. CV: RRR, no m/r/g.   LUNGS: CTA  bilat, nonlabored resps, good  aeration in all lung fields. ABD: soft, NT, ND, BS normal.  No hepatospenomegaly or mass.  No bruits. EXT: no clubbing or cyanosis.  She has 3+ pitting edema RLL and 2+ pitting on L LL. Musculoskeletal: no joint swelling, erythema, warmth, or tenderness.   Skin - no sores or suspicious lesions or rashes or color changes   Pertinent labs:  Lab Results  Component Value Date   TSH 5.57 (H) 05/17/2018   Lab Results  Component Value Date   WBC 3.8 (L) 10/14/2018   HGB 12.9 10/14/2018   HCT 38.7 10/14/2018   MCV 94.9 10/14/2018   PLT 129 (L) 10/14/2018   Lab Results  Component Value Date   CREATININE 0.98 08/23/2018   BUN 15 08/23/2018   NA 142 08/23/2018   K 3.8 08/23/2018   CL 100 08/23/2018   CO2 31 08/23/2018   Lab Results  Component Value Date   ALT 23 05/17/2018   AST 21 05/17/2018   ALKPHOS 46 05/17/2018   BILITOT 0.6 05/17/2018   Lab Results  Component Value Date   CHOL 221 (H) 05/17/2018   Lab Results  Component Value Date   HDL 61.10 05/17/2018   Lab Results  Component Value Date   LDLCALC 104 (H) 11/05/2017   Lab Results  Component Value Date   TRIG 217.0 (H) 05/17/2018   Lab Results  Component Value Date   CHOLHDL 4 05/17/2018   Lab Results  Component Value Date   HGBA1C 5.3 11/06/2017   Lab Results  Component Value Date   VITAMINB12 484 10/08/2015    ASSESSMENT AND PLAN:   1) HTN: control good via regular home monitoring. No changes today, but per Dr. Allison Quarry preference if she is to have uncontrolled home bp's then plan is to increase diovan to 67m qd. No changes today.  2) High risk med use: uses one benzo in AM and different benzo in PM---rx'd like this originally by her prior PCP and pt is clear on it.  Also uses tramadol infrequently. Controlled substance contract reviewed with patient today.  Patient signed this and it will be placed in the chart.   PMP AWARE reviewed today: most recent rx fill for  tramadol was 10/10/18, #60, rx by me. Most recent rx for alpraz was 07/24/18, #30, rx by Dr. SBenay Spice her oncologist. Most recent rx for clonaz was 06/03/18, #60, rx'd by Dr. NLenna Gilford her prior PCP who has now retired. I will be taking over responsibility for rx'ing her clonaz and I'll continue to rx her tramadol.  As of right now, though, Dr. SBenay Spicewill still be rx'ing her alprazolam. No rx's done today for any of these meds.  3) Health maintenance exam: Reviewed age and gender appropriate health maintenance issues (prudent diet, regular exercise, health risks of tobacco and excessive alcohol, use of seatbelts, fire alarms in home, use of sunscreen).  Also reviewed age and gender appropriate health screening as well as vaccine recommendations. Vaccines: She got flu vaccine 10/14/18.  Shingrix->rx sent to pt's pharmacy.  Otherwise UTD. Labs: fasting HP + HbA1c (IFG), Vit D (vit D def). Cervical ca screening: she is s/p remote hysterectomy for benign dx. Breast ca screening: she declines any further screening for this. Colon ca screening: last colonoscopy was 2016 and was done for IDA---internal hemorrhoids found but o/w normal.  No repeat indicated due to age  85 Abnormal right eye RR, + high risk med use (amiodarone): needs to see ophthalmologist->referral ordered today.  An After  Visit Summary was printed and given to the patient.  FOLLOW UP:  Return in about 3 months (around 02/04/2019) for routine chronic illness f/u.  Signed:  Crissie Sickles, MD           11/04/2018

## 2018-11-04 NOTE — Telephone Encounter (Signed)
Patient couldn't hear well enough in the appointment this morning to know why she has to see a specialist for her eyes. Please call patient's daughter Kenney Houseman at [phone].

## 2018-11-07 DIAGNOSIS — H2589 Other age-related cataract: Secondary | ICD-10-CM | POA: Diagnosis not present

## 2018-11-07 DIAGNOSIS — H25813 Combined forms of age-related cataract, bilateral: Secondary | ICD-10-CM | POA: Diagnosis not present

## 2018-11-09 ENCOUNTER — Other Ambulatory Visit (HOSPITAL_COMMUNITY): Payer: Self-pay | Admitting: Nurse Practitioner

## 2018-11-15 ENCOUNTER — Other Ambulatory Visit: Payer: Self-pay | Admitting: Cardiology

## 2018-11-15 DIAGNOSIS — H25811 Combined forms of age-related cataract, right eye: Secondary | ICD-10-CM | POA: Diagnosis not present

## 2018-11-19 ENCOUNTER — Encounter: Payer: Self-pay | Admitting: Family Medicine

## 2018-11-25 ENCOUNTER — Other Ambulatory Visit: Payer: Self-pay | Admitting: Cardiology

## 2018-11-27 ENCOUNTER — Other Ambulatory Visit: Payer: Self-pay | Admitting: Family Medicine

## 2018-12-18 ENCOUNTER — Other Ambulatory Visit: Payer: Self-pay | Admitting: Family Medicine

## 2018-12-18 NOTE — Telephone Encounter (Signed)
[facility]eds [facility] Always has issues because Dr. Lenna Gilford is on prescription but he is retired and Dr. Anitra Lauth has been filling since last year  [facility] [place]  Patient is out of meds

## 2018-12-18 NOTE — Telephone Encounter (Signed)
Rx sent in, pt was notified.

## 2018-12-18 NOTE — Telephone Encounter (Signed)
Requesting:clonazepam Contract:11/04/18 UDS:n/a Last Visit:11/04/18 Next Visit:02/03/19 Last Refill:01/10/18(60,5)  Please Advise. Medication pending. Dr.Nadel has retired

## 2018-12-19 ENCOUNTER — Other Ambulatory Visit: Payer: Self-pay | Admitting: Cardiology

## 2018-12-25 DIAGNOSIS — H2512 Age-related nuclear cataract, left eye: Secondary | ICD-10-CM | POA: Diagnosis not present

## 2018-12-27 DIAGNOSIS — H25812 Combined forms of age-related cataract, left eye: Secondary | ICD-10-CM | POA: Diagnosis not present

## 2019-02-03 ENCOUNTER — Ambulatory Visit (INDEPENDENT_AMBULATORY_CARE_PROVIDER_SITE_OTHER): Payer: PPO | Admitting: Family Medicine

## 2019-02-03 ENCOUNTER — Encounter: Payer: Self-pay | Admitting: Family Medicine

## 2019-02-03 ENCOUNTER — Other Ambulatory Visit: Payer: Self-pay

## 2019-02-03 ENCOUNTER — Other Ambulatory Visit: Payer: Self-pay | Admitting: Family Medicine

## 2019-02-03 VITALS — BP 164/82 | HR 72 | Temp 98.2°F | Resp 16 | Ht 64.0 in | Wt 202.0 lb

## 2019-02-03 DIAGNOSIS — E538 Deficiency of other specified B group vitamins: Secondary | ICD-10-CM

## 2019-02-03 DIAGNOSIS — D696 Thrombocytopenia, unspecified: Secondary | ICD-10-CM

## 2019-02-03 DIAGNOSIS — N183 Chronic kidney disease, stage 3 unspecified: Secondary | ICD-10-CM | POA: Diagnosis not present

## 2019-02-03 DIAGNOSIS — Z23 Encounter for immunization: Secondary | ICD-10-CM

## 2019-02-03 DIAGNOSIS — D649 Anemia, unspecified: Secondary | ICD-10-CM

## 2019-02-03 DIAGNOSIS — E039 Hypothyroidism, unspecified: Secondary | ICD-10-CM | POA: Diagnosis not present

## 2019-02-03 LAB — CBC WITH DIFFERENTIAL/PLATELET
Basophils Absolute: 0 10*3/uL (ref 0.0–0.1)
Basophils Relative: 0.9 % (ref 0.0–3.0)
Eosinophils Absolute: 0.1 10*3/uL (ref 0.0–0.7)
Eosinophils Relative: 1.5 % (ref 0.0–5.0)
HCT: 37.5 % (ref 36.0–46.0)
Hemoglobin: 12.6 g/dL (ref 12.0–15.0)
Lymphocytes Relative: 23.5 % (ref 12.0–46.0)
Lymphs Abs: 0.9 10*3/uL (ref 0.7–4.0)
MCHC: 33.5 g/dL (ref 30.0–36.0)
MCV: 89.3 fl (ref 78.0–100.0)
Monocytes Absolute: 0.4 10*3/uL (ref 0.1–1.0)
Monocytes Relative: 11.4 % (ref 3.0–12.0)
Neutro Abs: 2.5 10*3/uL (ref 1.4–7.7)
Neutrophils Relative %: 62.7 % (ref 43.0–77.0)
Platelets: 128 10*3/uL — ABNORMAL LOW (ref 150.0–400.0)
RBC: 4.2 Mil/uL (ref 3.87–5.11)
RDW: 14.2 % (ref 11.5–15.5)
WBC: 3.9 10*3/uL — ABNORMAL LOW (ref 4.0–10.5)

## 2019-02-03 LAB — BASIC METABOLIC PANEL
BUN: 22 mg/dL (ref 6–23)
CO2: 33 mEq/L — ABNORMAL HIGH (ref 19–32)
Calcium: 9.1 mg/dL (ref 8.4–10.5)
Chloride: 100 mEq/L (ref 96–112)
Creatinine, Ser: 0.98 mg/dL (ref 0.40–1.20)
GFR: 54 mL/min — ABNORMAL LOW (ref >60.00)
Glucose, Bld: 113 mg/dL — ABNORMAL HIGH (ref 70–99)
Potassium: 4.1 mEq/L (ref 3.5–5.1)
Sodium: 142 mEq/L (ref 135–145)

## 2019-02-03 LAB — TSH: TSH: 3.41 u[IU]/mL (ref 0.35–4.50)

## 2019-02-03 LAB — VITAMIN B12: Vitamin B-12: 240 pg/mL (ref 211–911)

## 2019-02-03 MED ORDER — CYANOCOBALAMIN 1000 MCG/ML IJ SOLN: INTRAMUSCULAR | 0 refills | Status: DC

## 2019-02-03 NOTE — Progress Notes (Signed)
OFFICE VISIT  02/03/2019   CC:  Chief Complaint  Patient presents with  . Follow-up    RCI, pt is fasting    HPI:    Patient is a 86 y.o. Caucasian female who presents for 3 mo f/u HTN, Hypothyroidism, GFR stage III. Also had abnl eye exam here last visit and I referred her to ophtho.  A/P as of last f/u visit: ") HTN: control good via regular home monitoring. No changes today, but per Dr. Allison Quarry preference if she is to have uncontrolled home bp's then plan is to increase diovan to 52m qd. No changes today.  2) High risk med use: uses one benzo in AM and different benzo in PM---rx'd like this originally by her prior PCP and pt is clear on it.  Also uses tramadol infrequently. Controlled substance contract reviewed with patient today.  Patient signed this and it will be placed in the chart.   PMP AWARE reviewed today: most recent rx fill for tramadol was 10/10/18, #60, rx by me. Most recent rx for alpraz was 07/24/18, #30, rx by Dr. SBenay Spice her oncologist. Most recent rx for clonaz was 06/03/18, #60, rx'd by Dr. NLenna Gilford her prior PCP who has now retired. I will be taking over responsibility for rx'ing her clonaz and I'll continue to rx her tramadol.  As of right now, though, Dr. SBenay Spicewill still be rx'ing her alprazolam. No rx's done today for any of these meds.  3) Health maintenance exam: Reviewed age and gender appropriate health maintenance issues (prudent diet, regular exercise, health risks of tobacco and excessive alcohol, use of seatbelts, fire alarms in home, use of sunscreen).  Also reviewed age and gender appropriate health screening as well as vaccine recommendations. Vaccines: She got flu vaccine 10/14/18.  Shingrix->rx sent to pt's pharmacy.  Otherwise UTD. Labs: fasting HP + HbA1c (IFG), Vit D (vit D def). Cervical ca screening: she is s/p remote hysterectomy for benign dx. Breast ca screening: she declines any further screening for this. Colon ca screening: last  colonoscopy was 2016 and was done for IDA---internal hemorrhoids found but o/w normal. No repeat indicated due to age  86 Abnormal right eye RR, + high risk med use (amiodarone): needs to see ophthalmologist->referral ordered today."  Interim hx:  She saw Dr. GKaty Fitchand got eye exam: got cataracts extracted from both eyes and sees well. Blood pressure checks daily: consistently 130/80 consistently.  Rarely reading in 140 range. No palpitations, dizziness, SOB, CP, or fatigue.   GFR: drinking a lot of fluids, avoids NSAIDs.  If in pain she takes a tramadol, "not very often". She feels well. She has not had her vit B12 to take at home in the last 2 mo.  PMP AWARE reviewed today: most recent rx for tramadol was filled 01/10/19, # 632 rx by me. No red flags.  Past Medical History:  Diagnosis Date  . Anemia of chronic disease   . Anticoagulation adequate, Eliquis 10/16/2014  . Anxiety    clonaz helps this AND her breathing  . Chronic diastolic congestive heart failure ([facility] 035/36/1443  Complicated by atrial fibrillation; Echo 07/23/14: mild LVH, EF 50-55%, grade 2 diast dysfxn.  NYHA class II.  .Marland KitchenChronic renal insufficiency, stage III (moderate)    GFR @ 50  . COPD (chronic obstructive pulmonary disease) ([facility]    recently noted on Xray, does not have any problems  . Follicular lymphoma ([facility]    Non Hodgkins B cell lymphoma; s/p chemo summer  2016--in remission still as of 09/2018: continue obs q 56mo . HLD (hyperlipidemia)     06/2014  . HTN (hypertension)   . Hypothyroidism   . IFG (impaired fasting glucose)   . Lip cancer    Dr. BJanace Hoardexcised this: invasive SCC--no sign of cancer at ENT f/u 10/2015  . Microcytic anemia    transfused 3 U total in hosp 07/2014  . Obesity (BMI 30-39.9)   . Olecranon bursitis of right elbow 02/2015  . Pancytopenia due to antineoplastic chemotherapy ([facility]   . Persistent atrial fibrillation (HCC)    With RVR; elec cardioversion 11/30/14.  On Amio since  10/2014; Dr. NLenna Gilfordfollowing her from pulm standpoint regarding this med.  .Marland KitchenPositive occult stool blood test 08/08/14   Endoscopies ok 08/2014  . Primary osteoarthritis of both knees 04/2016   Dr. GGladstone Lighter bone on bone--to get euflexxa to both knees.  . Pulmonary metastases ([facility] 07/24/2014  . Shortness of breath dyspnea    with exertion.  Dr. NLenna Gilfordstarted her on clonazepam to try to relax her chest wall and allow more satisfying deep breath. (03/2016).  . Vitamin B 12 deficiency   . Vitamin D deficiency    dose increased to 50 K U tab TWICE weekly 04/2017    Past Surgical History:  Procedure Laterality Date  . ABDOMINAL HYSTERECTOMY  1978  . BONE MARROW BIOPSY  07/24/14  . BREAST BIOPSY    . CARDIOVERSION N/A 10/16/2014   Procedure: CARDIOVERSION;  Surgeon: BLelon Perla MD;  [facility];  Service: Cardiovascular;  Laterality: N/A;  . CARDIOVERSION N/A 11/30/2014   Procedure: CARDIOVERSION;  Surgeon: DLarey Dresser MD;  [facility];  Service: Cardiovascular;  Laterality: N/A;  . COLONOSCOPY  09/23/14   small hemorrhoids, otherwise normal (performed for IDA and heme+ stool)  . COLONOSCOPY    . MASS EXCISION Left 07/15/2015   Left lower lip mass--invasive SCC w/negative margins.  Procedure: EXCISION MASS;  Surgeon: JMelissa Montane MD;  [place] [facility]  Service: ENT;  Laterality: Left;  Wedge excision left lower lip mass  . NM MYOVIEW LTD  10/29/2014   medium size mild surgery defect in the mid anterior and apical anterior location suggestive of breast attenuation. No reversibility. LOW RISK  . PFTs  02/2015   Restriction with diffusion defect: cardiologist referred her to pulm to help interpret PFTs and decide whether she has amiodarone toxicity  . TEE WITHOUT CARDIOVERSION N/A 10/16/2014   Procedure: TRANSESOPHAGEAL ECHOCARDIOGRAM (TEE);  Surgeon: BLelon Perla MD;  [facility];  Service: Cardiovascular;  Laterality: N/A;  . TRANSTHORACIC ECHOCARDIOGRAM   07/23/14   mild LVH, EF 50-55%, grade 2 diast dysfxn  . UPPER GI ENDOSCOPY  09/23/2014   small hiatus hernia, nodules in stomach biopsied (chronic active erosive atrophic gastritis with intestinal metaplasia--no dysplasia or malignancy) otherwise normal    Outpatient Medications Prior to Visit  Medication Sig Dispense Refill  . ALPRAZolam (XANAX) 0.5 MG tablet 1 tab po qhs prn anxiety-related insomnia 30 tablet 0  . amiodarone (PACERONE) 200 MG tablet TAKE ONE TABLET BY MOUTH EVERY DAY. 90 tablet 0  . apixaban (ELIQUIS) 5 MG TABS tablet Take 1 tablet (5 mg total) by mouth 2 (two) times daily. 180 tablet 2  . clonazePAM (KLONOPIN) 0.5 MG tablet TAKE 1/2 TO 1 TABLET BY MOUTH TWICE A DAY AS NEEDED 90 tablet 1  . cyanocobalamin (,VITAMIN B-12,) 1000 MCG/ML injection INJECT 1 ML (1000 MCG TOTAL) INTO THE  SKIN EVERY 30 DAYS. 10 mL 0  . ferrous sulfate 325 (65 FE) MG tablet Take 1 tablet (325 mg total) by mouth 2 (two) times daily with a meal. 30 tablet 1  . fish oil-omega-3 fatty acids 1000 MG capsule Take 2 g by mouth daily.    . furosemide (LASIX) 40 MG tablet TAKE 1.5 TABLETS (60 MG TOTAL) BY MOUTH 2 (TWO) TIMES DAILY. 270 tablet 1  . levothyroxine (SYNTHROID) 100 MCG tablet TAKE 1 TABLET BY MOUTH EVERY DAY BEFORE BREAKFAST 90 tablet 1  . OXYGEN Inhale into the lungs. 1Lt at bedtime    . potassium chloride 20 MEQ TBCR Take 20 mEq by mouth daily. 30 tablet 3  . pravastatin (PRAVACHOL) 80 MG tablet TAKE 1 TABLET BY MOUTH EVERY DAY 90 tablet 1  . traMADol (ULTRAM) 50 MG tablet TAKE 1 TABLET BY MOUTH TWICE A DAY AS NEEDED FOR PAIN 60 tablet 2  . valsartan (DIOVAN) 40 MG tablet TAKE 1 TABLET BY MOUTH EVERY DAY 30 tablet 6  . Vitamin D, Ergocalciferol, (DRISDOL) 1.25 MG (50000 UT) CAPS capsule TAKE 1 CAPSULE (50,000 UNITS TOTAL) BY MOUTH 2 (TWO) TIMES A WEEK. 24 capsule 3   No facility-administered medications prior to visit.    Allergies  Allergen Reactions  . Iodinated Diagnostic Agents Rash and  Other (See Comments)    CT Contrast    ROS As per HPI  PE: Blood pressure (!) 164/82, pulse 72, temperature 98.2 F (36.8 C), temperature source Temporal, resp. rate 16, height '5\' 4"'  (1.626 m), weight 202 lb (91.6 kg), SpO2 97 %. Body mass index is 34.67 kg/m.  Gen: Alert, well appearing.  Patient is oriented to person, place, time, and situation. AFFECT: pleasant, lucid thought and speech. Eyes: RRs symmetric, PERRL.  No further exam today.  LABS:  Lab Results  Component Value Date   TSH 6.15 (H) 11/04/2018   Lab Results  Component Value Date   WBC 3.1 (L) 11/04/2018   HGB 12.5 11/04/2018   HCT 37.0 11/04/2018   MCV 91.8 11/04/2018   PLT 129.0 (L) 11/04/2018   Lab Results  Component Value Date   IRON 44 10/09/2016   TIBC 379 10/09/2016   FERRITIN 32 10/09/2016   Lab Results  Component Value Date   VITAMINB12 484 10/08/2015    Lab Results  Component Value Date   CREATININE 0.88 11/04/2018   BUN 15 11/04/2018   NA 142 11/04/2018   K 3.9 11/04/2018   CL 100 11/04/2018   CO2 34 (H) 11/04/2018   Lab Results  Component Value Date   ALT 21 11/04/2018   AST 25 11/04/2018   ALKPHOS 55 11/04/2018   BILITOT 0.8 11/04/2018   Lab Results  Component Value Date   CHOL 209 (H) 11/04/2018   Lab Results  Component Value Date   HDL 62.70 11/04/2018   Lab Results  Component Value Date   LDLCALC 107 (H) 11/04/2018   Lab Results  Component Value Date   TRIG 196.0 (H) 11/04/2018   Lab Results  Component Value Date   CHOLHDL 3 11/04/2018   Lab Results  Component Value Date   HGBA1C 5.6 11/04/2018   Lab Results  Component Value Date   VITAMINB12 484 10/08/2015    IMPRESSION AND PLAN:  Feeling well!  1) Cataracts->doing great s/p extraction OU.  2) CRI III: hydrating well and avoiding NSAIDs. BMET today.  3) Mild low WBC and platelets: monitor CBC today.  4) HTN: mild elevation  here as usual, NORMAL per home monitoring. BMET today.  No med  changes.  5) Hypothyrodism: last TSH a little high but no dose change b/c pt feels good and she has a-fib. Recheck TSH today.  6) Vit b12 def: has been on IM q mo, but not the last 2 mo b/c no med. Recheck b12 level today. If b12 ok, start oral in place of IM.  An After Visit Summary was printed and given to the patient.  FOLLOW UP: Return in about 3 months (around 05/04/2019) for routine chronic illness f/u.  Signed:  Crissie Sickles, MD           02/03/2019

## 2019-02-15 ENCOUNTER — Other Ambulatory Visit: Payer: Self-pay | Admitting: Family Medicine

## 2019-02-15 ENCOUNTER — Other Ambulatory Visit (HOSPITAL_COMMUNITY): Payer: Self-pay | Admitting: Nurse Practitioner

## 2019-02-17 NOTE — Telephone Encounter (Signed)
Requesting: Tramadol Contract:11/04/18 UDS:n/a Last Visit:02/03/19 Next Visit:05/05/19 Last Refill:10/10/18 (60,2)  Please Advise. Medication pending

## 2019-02-18 ENCOUNTER — Other Ambulatory Visit: Payer: Self-pay | Admitting: Cardiology

## 2019-02-19 ENCOUNTER — Other Ambulatory Visit: Payer: Self-pay | Admitting: Family Medicine

## 2019-02-19 ENCOUNTER — Telehealth: Payer: Self-pay | Admitting: Oncology

## 2019-02-19 NOTE — Telephone Encounter (Signed)
Returned patient's phone call regarding rescheduling 03/25 appointment, spoke with patient's daughter and appointment has moved to 03/26.

## 2019-03-11 ENCOUNTER — Other Ambulatory Visit: Payer: Self-pay

## 2019-03-11 ENCOUNTER — Encounter (HOSPITAL_COMMUNITY): Payer: Self-pay | Admitting: Nurse Practitioner

## 2019-03-11 ENCOUNTER — Ambulatory Visit (HOSPITAL_COMMUNITY)
Admission: RE | Admit: 2019-03-11 | Discharge: 2019-03-11 | Disposition: A | Discharge status: Home / Self Care | Payer: PPO | Source: Ambulatory Visit | Attending: Nurse Practitioner | Admitting: Nurse Practitioner

## 2019-03-11 VITALS — BP 140/74 | HR 73 | Ht 64.0 in | Wt 206.6 lb

## 2019-03-11 DIAGNOSIS — C8590 Non-Hodgkin lymphoma, unspecified, unspecified site: Secondary | ICD-10-CM | POA: Insufficient documentation

## 2019-03-11 DIAGNOSIS — Z9221 Personal history of antineoplastic chemotherapy: Secondary | ICD-10-CM | POA: Diagnosis not present

## 2019-03-11 DIAGNOSIS — E785 Hyperlipidemia, unspecified: Secondary | ICD-10-CM | POA: Insufficient documentation

## 2019-03-11 DIAGNOSIS — I480 Paroxysmal atrial fibrillation: Secondary | ICD-10-CM

## 2019-03-11 DIAGNOSIS — N183 Chronic kidney disease, stage 3 unspecified: Secondary | ICD-10-CM | POA: Diagnosis not present

## 2019-03-11 DIAGNOSIS — E039 Hypothyroidism, unspecified: Secondary | ICD-10-CM | POA: Diagnosis not present

## 2019-03-11 DIAGNOSIS — Z6835 Body mass index (BMI) 35.0-35.9, adult: Secondary | ICD-10-CM | POA: Diagnosis not present

## 2019-03-11 DIAGNOSIS — I447 Left bundle-branch block, unspecified: Secondary | ICD-10-CM | POA: Insufficient documentation

## 2019-03-11 DIAGNOSIS — Z833 Family history of diabetes mellitus: Secondary | ICD-10-CM | POA: Insufficient documentation

## 2019-03-11 DIAGNOSIS — Z85819 Personal history of malignant neoplasm of unspecified site of lip, oral cavity, and pharynx: Secondary | ICD-10-CM | POA: Insufficient documentation

## 2019-03-11 DIAGNOSIS — Z7901 Long term (current) use of anticoagulants: Secondary | ICD-10-CM | POA: Diagnosis not present

## 2019-03-11 DIAGNOSIS — M170 Bilateral primary osteoarthritis of knee: Secondary | ICD-10-CM | POA: Diagnosis not present

## 2019-03-11 DIAGNOSIS — I440 Atrioventricular block, first degree: Secondary | ICD-10-CM | POA: Insufficient documentation

## 2019-03-11 DIAGNOSIS — Z79899 Other long term (current) drug therapy: Secondary | ICD-10-CM | POA: Diagnosis not present

## 2019-03-11 DIAGNOSIS — E559 Vitamin D deficiency, unspecified: Secondary | ICD-10-CM | POA: Diagnosis not present

## 2019-03-11 DIAGNOSIS — F419 Anxiety disorder, unspecified: Secondary | ICD-10-CM | POA: Insufficient documentation

## 2019-03-11 DIAGNOSIS — Z79890 Hormone replacement therapy (postmenopausal): Secondary | ICD-10-CM | POA: Diagnosis not present

## 2019-03-11 DIAGNOSIS — R7301 Impaired fasting glucose: Secondary | ICD-10-CM | POA: Diagnosis not present

## 2019-03-11 DIAGNOSIS — I130 Hypertensive heart and chronic kidney disease with heart failure and stage 1 through stage 4 chronic kidney disease, or unspecified chronic kidney disease: Secondary | ICD-10-CM | POA: Insufficient documentation

## 2019-03-11 DIAGNOSIS — D6869 Other thrombophilia: Secondary | ICD-10-CM | POA: Diagnosis not present

## 2019-03-11 DIAGNOSIS — D631 Anemia in chronic kidney disease: Secondary | ICD-10-CM | POA: Insufficient documentation

## 2019-03-11 DIAGNOSIS — I4819 Other persistent atrial fibrillation: Secondary | ICD-10-CM | POA: Diagnosis not present

## 2019-03-11 DIAGNOSIS — Z9849 Cataract extraction status, unspecified eye: Secondary | ICD-10-CM | POA: Insufficient documentation

## 2019-03-11 DIAGNOSIS — Z91041 Radiographic dye allergy status: Secondary | ICD-10-CM | POA: Insufficient documentation

## 2019-03-11 DIAGNOSIS — D509 Iron deficiency anemia, unspecified: Secondary | ICD-10-CM | POA: Insufficient documentation

## 2019-03-11 DIAGNOSIS — Z808 Family history of malignant neoplasm of other organs or systems: Secondary | ICD-10-CM | POA: Insufficient documentation

## 2019-03-11 DIAGNOSIS — I5042 Chronic combined systolic (congestive) and diastolic (congestive) heart failure: Secondary | ICD-10-CM | POA: Insufficient documentation

## 2019-03-11 DIAGNOSIS — Z85118 Personal history of other malignant neoplasm of bronchus and lung: Secondary | ICD-10-CM | POA: Insufficient documentation

## 2019-03-11 DIAGNOSIS — Z807 Family history of other malignant neoplasms of lymphoid, hematopoietic and related tissues: Secondary | ICD-10-CM | POA: Insufficient documentation

## 2019-03-11 DIAGNOSIS — E669 Obesity, unspecified: Secondary | ICD-10-CM | POA: Insufficient documentation

## 2019-03-11 DIAGNOSIS — J449 Chronic obstructive pulmonary disease, unspecified: Secondary | ICD-10-CM | POA: Insufficient documentation

## 2019-03-11 DIAGNOSIS — Z90710 Acquired absence of both cervix and uterus: Secondary | ICD-10-CM | POA: Insufficient documentation

## 2019-03-11 DIAGNOSIS — I110 Hypertensive heart disease with heart failure: Secondary | ICD-10-CM | POA: Diagnosis not present

## 2019-03-11 DIAGNOSIS — Z800 Family history of malignant neoplasm of digestive organs: Secondary | ICD-10-CM | POA: Insufficient documentation

## 2019-03-11 DIAGNOSIS — R9431 Abnormal electrocardiogram [ECG] [EKG]: Secondary | ICD-10-CM | POA: Insufficient documentation

## 2019-03-11 LAB — COMPREHENSIVE METABOLIC PANEL
ALT: 31 U/L (ref 0–44)
AST: 35 U/L (ref 15–41)
Albumin: 4.4 g/dL (ref 3.5–5.0)
Alkaline Phosphatase: 54 U/L (ref 38–126)
Anion gap: 11 (ref 5–15)
BUN: 15 mg/dL (ref 8–23)
CO2: 30 mmol/L (ref 22–32)
Calcium: 9.1 mg/dL (ref 8.9–10.3)
Chloride: 99 mmol/L (ref 98–111)
Creatinine, Ser: 1.15 mg/dL — ABNORMAL HIGH (ref 0.44–1.00)
GFR calc Af Amer: 51 mL/min — ABNORMAL LOW (ref >60)
GFR calc non Af Amer: 44 mL/min — ABNORMAL LOW (ref >60)
Glucose, Bld: 126 mg/dL — ABNORMAL HIGH (ref 70–99)
Potassium: 4.1 mmol/L (ref 3.5–5.1)
Sodium: 140 mmol/L (ref 135–145)
Total Bilirubin: 0.9 mg/dL (ref 0.3–1.2)
Total Protein: 6.7 g/dL (ref 6.5–8.1)

## 2019-03-11 MED ORDER — AMIODARONE HCL 200 MG PO TABS: 100.0000 mg | ORAL_TABLET | Freq: Every day | ORAL | 2 refills | Status: DC

## 2019-03-11 NOTE — Progress Notes (Addendum)
Patient ID: Amanda Sellers, female   DOB: [birth year], 86 y.o.   MRN: [id number]           Primary Care Physician: Tammi Sou, MD Referring Physician: Dr. Ronni Rumble Amanda Sellers is a 86 y.o. female with a h/o  diastolic heart failure, afib, newly diagnosed with recent dx of lymphoma. She was admitted to [facility] 9/22 on IV dilt and given diuretics for acute on chronic systolic HF. It was arranged for to undergo TEE DCCV.  She had exertional dyspnea with PND & orthopnea.She felt very tired & had little energy. She  noted a few palpitations, but does not recall rapid irregular heart rates/rhythms.   She had TEE without thrombus and underwent DCCV that was successful with one shock of 120J. She did well post procedure and was seen by Dr. Jerilynn Mages. Croitoru and found stable for discharge. She was negative 2700cc since admit and weight was down 4 lbs with IV diuretics. D/C wt is 168.  Plan on d/c was for  uninterrupted anticoagulation for next 30 days, preferably lifelong barring bleeding complications. AFib clinic follow up in 1-2 weeks. Outpatient [facility], non-urgent. ASA stopped.   She was seen in the afib clinic, 10/10 and found to be in afib with rvr. She went into afib short time after cardioversion. EKG showed afib at 130-140 bpm. She was  not on any drugs for rate control.  Is taking DOAC without fail. Continues with chemotherapy for lymphoma.   She was started on BB and  returned to clinic 10/12 with better v rates. 80's with sitting and 90-100's with activity. She does not have any PND/orthopnea although she has slept better in her recliner for months now.No lower extremity edema. Her weight is stable at home.  She has  finished chemotherapy for non- hodgkin's lymphoma and had f/u Pet scan 10/26,  and CA appears to be in remission, so per pt, no more chemo for right now. She has been on DOAC now x one month so will start attempts to restore SR. Discussed options  amiodarone or tikosyn and she would prefer amiodarone as she has two neighbors on amiodarone and they are both in rhythm and tolerating drug well. She is not interested in a hospital stay for drug loading. She reports rash with recent contrast dye. Amiodarone was started 200 mg bid, to then go down to 200 mg a day and then would discuss cardioversion.  She asked to be seen 11/2,  in the afib clinic for increased shortness of breath yesterday and last night. She has slept in the recliner for months but did not feel as well last night and slept poorly, feeling more short of breath and fullness in her chest.She has noticed increased pedal edema. She did not take her amiodarone last night or this am. Ekg shows rate controlled afib at 82 bpm, qtc at 464 ms.PO on RA is 98%. Weight is up 2 lbs from last visit, 6 lbs since 10/17. Lasix dose was decreased to 40 mg daily from 40 mg am and 20 mg pm when seen by Dr. Ellyn Hack 10/22.. Pt has noticed weight climbing for several days. She is fairly comfortable when quiet and sitting.   She was asked to double lasix to 80 mg  x 3 days, and to reduce amiodarone 200 mg a day. She had lost 3 pounds on return to clinic several days later. Clinically, she is much improved with more energy and less shortness of  breath. Able to sleep well. Had enough energy to walk into clinic today without wheelchair and felt well enough to clean her house yesterday. Ideally,I would like to load amiodarone a while longer but will go ahead and  pursue DCCV sooner than later, as it is contributing to her heart failure. Compliant with blood thinner without missed doses On recent labs, TSH was elevated but daughter explained that she just started taking replacement correctly on an empty stomach, and sees PCP 11/16 and she expects him to adjust the dose.  11/14-  Returns to afib clinic with a smile on her face and reporting all the activities she has been doing over the last week, having returned to  sinus rhythm after cardioversion. Much more energy and breathing is much improved.  Fluid status is stable.  12/13- Returns to afib clinic for f/u and reports doing well No afib, no further shortness of breath or edema. She is back to her usual activities. Got a good report form the Ca unit recently. Tolerating amiodarone 200 mg a day.  Return to afib clinic 4/18, reports that she is staying in [place]. Saw pulmonology yesterday for some c/o of shortness of breath but breathing much improved since staying in SR. Xray no active process, thyroid, liver studies drawn yesterday, and normal. MD said he would call hr after results of tests were reviewed. Fluid status normal.  F/u in afib clinic 01/31/16, pt continues to do well without any afib. Her fluid status is normal. Last TSH, 3 months ago, showed mild increase and will check TSH and liver panel today. Her breathing is good other than the last week in the bitter cold weather  she does not breath as good outside.No bleeding issues with eliquis. Fluid status is stable on lasix.  F/u in afib 01/26/17  for shortness of breath. Pt has been having shortness of breath for some while but contributed it to her knees and difficultly in ambulating. She was seen by pulmonology in July with improved PFT's and was OK to continue amiodarone. She was seen in November by Dr. Ellyn Hack and shortness of breath was thought to be stable/chronic. She feels that her shortness of breath has worsened since Christmas.  If she walks to the bathroom and back she is entirely out of breath. Sleeps on 2 pillows and has not felt the need to increase to elevate her head more at night. Ekg today shows junctional rhythm at 46 bpm. She is breathless speaking and started walking to clinic room but had to stop and get w/c. PO 99%.  F/u in afib clinic 1/7, after reducing amiodarone to 1/2 tab a day as well BB to 1/2 tab a day for brady as well as increase in shortness of breath. She is feeling much better  today to the point she was able to walk to clinic and not use w/c. She is not breathless talking.  F/u in afib clinic, 1/23, She feels great! Saw her pulmonologist recently and she said he was very pleased with her lung status. She is walking with a walker and observed coming into the clinic, no exertional dyspnea noted.  Her  HR is Sinus in the 40's today, but she is not symptomatic. She checks her HR with a pulse ox several times a day and it usually runs around 53-60.   F/u in afib clinic, 08/15/17. She is feeling well. Just saw Dr. Lenna Gilford and was given a good report. Ekg, SR with HR's in the 50's. 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 She has some chromic LE, which was worse after recent knee injections.. She did increase lasix for a couple of days and now back to baseline now.  F/u in afib clinic, 1/8, she is doing very well. She is in SR and has not noted any afib. 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 No LLE. She walks with a walker, no falls. No shortness of breath. On amiodarone 100 mg qd. Notices a little numbness in her distal fingertips intermittently.No issues with anticoagulation.  F/u in 07/31/18. Continues on amiodarone for afib management. She is in [place] today and has not noted any irregular heart rhythm. Her main complaint is that she has noted more LLE and has been back to sleeping in her recliner. She continues on lasix 40 mg bid. Avoids salt and salty foods and sits with her feet up during the day. No change in her health other than increase in  edema. She does drink 8 + glasses of water a day and may need to back off that some.weight is up 7 lbs when compared to last in person office visit in March.  F/u 03/11/19. She states that she feels great. She is in SR, no appreciated afib. Continues on amiodarone.  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 No pedal edema or shortness of breath. No issues with eliquis with a CHA2DS2VASc score of 5.  Today, she denies  symptoms of palpitations, chest pain,  orthopnea, PND, mild chronic lower extremity edema, no dizziness, presyncope, syncope, or neurologic sequela.  The patient is tolerating medications without difficulties and is otherwise without complaint today.   Past Medical History:  Diagnosis Date  . Anemia of chronic disease   . Anticoagulation adequate, Eliquis 10/16/2014  . Anxiety    clonaz helps this AND her breathing  . Chronic diastolic congestive heart failure ([place]) 98/26/4158   Complicated by atrial fibrillation; Echo 07/23/14: mild LVH, EF 50-55%, grade 2 diast dysfxn.  NYHA class II.  Marland Kitchen Chronic renal insufficiency, stage III (moderate)    GFR @ 50  . COPD (chronic obstructive pulmonary disease) ([place])    recently noted on Xray, does not have any problems  . Follicular lymphoma ([place])    Non Hodgkins B cell lymphoma; s/p chemo summer 2016--in remission still as of 09/2018: continue obs q 80mo . HLD (hyperlipidemia)     06/2014  . HTN (hypertension)   . Hypothyroidism   . IFG (impaired fasting glucose)   . Lip cancer    Dr. BJanace Hoardexcised this: invasive SCC--no sign of cancer at ENT f/u 10/2015  . Microcytic anemia    transfused 3 U total in hosp 07/2014  . Obesity (BMI 30-39.9)   . Olecranon bursitis of right elbow 02/2015  . Pancytopenia due to antineoplastic chemotherapy ([place]   . Persistent atrial fibrillation ([facility])    With RVR; elec cardioversion 11/30/14.  On Amio since 10/2014; Dr. NLenna Gilfordfollowing her from pulm standpoint regarding this med.  .Marland KitchenPositive occult stool blood test 08/08/14   Endoscopies ok 08/2014  . Primary osteoarthritis of both knees 04/2016   Dr. GGladstone Lighter bone on bone--to get euflexxa to both knees.  . Pulmonary metastases ([place] 07/24/2014  . Shortness of breath dyspnea    with exertion.  Dr. NLenna Gilfordstarted her on clonazepam to try to relax her chest wall and allow more satisfying deep breath. (03/2016).  . Vitamin  B 12 deficiency   . Vitamin D deficiency    dose  increased to 50 K U tab TWICE weekly 04/2017   Past Surgical History:  Procedure Laterality Date  . ABDOMINAL HYSTERECTOMY  1978  . BONE MARROW BIOPSY  07/24/14  . BREAST BIOPSY    . CARDIOVERSION N/A 10/16/2014   Procedure: CARDIOVERSION;  Surgeon: Lelon Perla, MD;  [facility] ENDOSCOPY;  Service: Cardiovascular;  Laterality: N/A;  . CARDIOVERSION N/A 11/30/2014   Procedure: CARDIOVERSION;  Surgeon: Larey Dresser, MD;  Location: [facility];  Service: Cardiovascular;  Laterality: N/A;  . CATARACT EXTRACTION  2020   OU: Dr. Katy Fitch  . COLONOSCOPY  09/23/14   small hemorrhoids, otherwise normal (performed for IDA and heme+ stool)  . COLONOSCOPY    . MASS EXCISION Left 07/15/2015   Left lower lip mass--invasive SCC w/negative margins.  Procedure: EXCISION MASS;  Surgeon: Melissa Montane, MD;  Location: [facility];  Service: ENT;  Laterality: Left;  Wedge excision left lower lip mass  . NM MYOVIEW LTD  10/29/2014   medium size mild surgery defect in the mid anterior and apical anterior location suggestive of breast attenuation. No reversibility. LOW RISK  . PFTs  02/2015   Restriction with diffusion defect: cardiologist referred her to pulm to help interpret PFTs and decide whether she has amiodarone toxicity  . TEE WITHOUT CARDIOVERSION N/A 10/16/2014   Procedure: TRANSESOPHAGEAL ECHOCARDIOGRAM (TEE);  Surgeon: Lelon Perla, MD;  Location: [facility] ENDOSCOPY;  Service: Cardiovascular;  Laterality: N/A;  . TRANSTHORACIC ECHOCARDIOGRAM  07/23/14   mild LVH, EF 50-55%, grade 2 diast dysfxn  . UPPER GI ENDOSCOPY  09/23/2014   small hiatus hernia, nodules in stomach biopsied (chronic active erosive atrophic gastritis with intestinal metaplasia--no dysplasia or malignancy) otherwise normal    Current Outpatient Medications  Medication Sig Dispense Refill  . ALPRAZolam (XANAX) 0.5 MG tablet 1 tab po qhs prn anxiety-related insomnia 30 tablet 0  . amiodarone (PACERONE) 200 MG tablet TAKE ONE TABLET BY MOUTH  EVERY DAY. 90 tablet 2  . clonazePAM (KLONOPIN) 0.5 MG tablet TAKE 1/2 TO 1 TABLET BY MOUTH TWICE A DAY AS NEEDED 90 tablet 1  . cyanocobalamin (,VITAMIN B-12,) 1000 MCG/ML injection INJECT 1 ML (1000 MCG TOTAL) INTO THE SKIN EVERY 30 DAYS. 10 mL 0  . ELIQUIS 5 MG TABS tablet TAKE 1 TABLET BY MOUTH TWICE A DAY 180 tablet 2  . ferrous sulfate 325 (65 FE) MG tablet Take 1 tablet (325 mg total) by mouth 2 (two) times daily with a meal. 30 tablet 1  . fish oil-omega-3 fatty acids 1000 MG capsule Take 2 g by mouth daily.    . furosemide (LASIX) 40 MG tablet TAKE 1.5 TABLETS (60 MG TOTAL) BY MOUTH 2 (TWO) TIMES DAILY. 270 tablet 1  . levothyroxine (SYNTHROID) 100 MCG tablet TAKE 1 TABLET BY MOUTH EVERY DAY BEFORE BREAKFAST 90 tablet 0  . OXYGEN Inhale into the lungs. 1Lt at bedtime    . potassium chloride 20 MEQ TBCR Take 20 mEq by mouth daily. 30 tablet 3  . pravastatin (PRAVACHOL) 80 MG tablet TAKE 1 TABLET BY MOUTH EVERY DAY 90 tablet 1  . traMADol (ULTRAM) 50 MG tablet TAKE 1 TABLET BY MOUTH TWICE A DAY AS NEEDED FOR PAIN 60 tablet 3  . valsartan (DIOVAN) 40 MG tablet TAKE 1 TABLET BY MOUTH EVERY DAY 30 tablet 6  . Vitamin D, Ergocalciferol, (DRISDOL) 1.25 MG (50000 UT) CAPS capsule TAKE 1 CAPSULE (50,000  UNITS TOTAL) BY MOUTH 2 (TWO) TIMES A WEEK. 24 capsule 3   No current facility-administered medications for this encounter.    Allergies  Allergen Reactions  . Iodinated Diagnostic Agents Rash and Other (See Comments)    CT Contrast    Social History   Socioeconomic History  . Marital status: Married    Spouse name: Not on file  . Number of children: 8  . Years of education: Not on file  . Highest education level: Not on file  Occupational History  . Occupation: retired  Tobacco Use  . Smoking status: Never Smoker  . Smokeless tobacco: Never Used  Substance and Sexual Activity  . Alcohol use: No    Alcohol/week: 0.0 standard drinks  . Drug use: No  . Sexual activity: Not on  file  Other Topics Concern  . Not on file  Social History Narrative   Widowed, has 8 children.   Orig from [place], now lives in [place].   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   No tob/alc/drugs.   Social Determinants of Health   Financial Resource Strain:   . Difficulty of Paying Living Expenses: Not on file  Food Insecurity:   . Worried About Charity fundraiser in the Last Year: Not on file  . Ran Out of Food in the Last Year: Not on file  Transportation Needs:   . Lack of Transportation (Medical): Not on file  . Lack of Transportation (Non-Medical): Not on file  Physical Activity:   . Days of Exercise per Week: Not on file  . Minutes of Exercise per Session: Not on file  Stress:   . Feeling of Stress : Not on file  Social Connections:   . Frequency of Communication with Friends and Family: Not on file  . Frequency of Social Gatherings with Friends and Family: Not on file  . Attends Religious Services: Not on file  . Active Member of Clubs or Organizations: Not on file  . Attends Archivist Meetings: Not on file  . Marital Status: Not on file  Intimate Partner Violence:   . Fear of Current or Ex-Partner: Not on file  . Emotionally Abused: Not on file  . Physically Abused: Not on file  . Sexually Abused: Not on file    Family History  Problem Relation Age of Onset  . Melanoma Mother   . Stomach cancer Father   . Lymphoma Sister   . Diabetes Daughter     ROS- All systems are reviewed and negative except as per the HPI above  Physical Exam: Vitals:   03/11/19 1008  BP: 140/74  Pulse: 73  Weight: 93.7 kg  Height: '5\' 4"'$  (1.626 m)    GEN- The patient is well appearing, alert and oriented x 3 today.   Head- normocephalic, atraumatic Eyes-  Sclera clear, conjunctiva pink Ears- hearing intact Oropharynx- clear Neck- supple, no JVP Lymph- no cervical lymphadenopathy Lungs- Clear to ausculation  bilaterally, normal work of breathing Heart-  regular rate and rhythm, no murmurs, rubs or gallops, PMI not laterally displaced GI- soft, NT, ND, + BS Extremities- no clubbing, cyanosis, 2+  Edema to knee area MS- no significant deformity or atrophy Skin- no rash or lesion Psych- euthymic mood, full affect Neuro- strength and sensation are intact  EKG- Sinus rhythm at 73 bpm, with first degree AV block, pr int 121 ms, qrs  138 ms, qtc 517 ms (stable) Epic  records reviewed. Labs reviewed   Assessment and Plan: 1 Afib Maintaining SR on 100 mg amiodarone PFT's currently not being done for potential Covid exposure  Off BB for some time now  for bradycardia Continue apixaban for chadsvasc score of at least 4  Cmet/tsh today   2. Diastolic heart failure Fluid stable  Continue  lasix to 60 mg bid Continue usual K+ dose  Avoid salt  Elevate legs when sitting  3. HTN Stable   4. Non Hodgkns B cell lymphoma S/p chemotherapy Per oncology    Amanda Sellers, [facility] [address] [place], [place] [address] [phone]

## 2019-03-11 NOTE — Addendum Note (Signed)
Encounter addended by: Sherran Needs, NP on: 03/11/2019 10:54 AM  Actions taken: Clinical Note Signed

## 2019-04-17 ENCOUNTER — Ambulatory Visit: Payer: PPO | Admitting: Oncology

## 2019-04-17 ENCOUNTER — Other Ambulatory Visit: Payer: PPO

## 2019-04-18 ENCOUNTER — Inpatient Hospital Stay: Payer: PPO | Attending: Oncology

## 2019-04-18 ENCOUNTER — Inpatient Hospital Stay (HOSPITAL_BASED_OUTPATIENT_CLINIC_OR_DEPARTMENT_OTHER): Payer: PPO | Admitting: Oncology

## 2019-04-18 ENCOUNTER — Other Ambulatory Visit: Payer: Self-pay

## 2019-04-18 VITALS — BP 173/60 | HR 92 | Temp 98.5°F | Resp 17 | Ht 64.0 in | Wt 213.3 lb

## 2019-04-18 DIAGNOSIS — L989 Disorder of the skin and subcutaneous tissue, unspecified: Secondary | ICD-10-CM | POA: Insufficient documentation

## 2019-04-18 DIAGNOSIS — Z85818 Personal history of malignant neoplasm of other sites of lip, oral cavity, and pharynx: Secondary | ICD-10-CM | POA: Diagnosis not present

## 2019-04-18 DIAGNOSIS — C8200 Follicular lymphoma grade I, unspecified site: Secondary | ICD-10-CM | POA: Diagnosis not present

## 2019-04-18 DIAGNOSIS — D509 Iron deficiency anemia, unspecified: Secondary | ICD-10-CM | POA: Insufficient documentation

## 2019-04-18 DIAGNOSIS — R0609 Other forms of dyspnea: Secondary | ICD-10-CM | POA: Diagnosis not present

## 2019-04-18 LAB — CBC WITH DIFFERENTIAL (CANCER CENTER ONLY)
Abs Immature Granulocytes: 0.02 10*3/uL (ref 0.00–0.07)
Basophils Absolute: 0 10*3/uL (ref 0.0–0.1)
Basophils Relative: 1 %
Eosinophils Absolute: 0.1 10*3/uL (ref 0.0–0.5)
Eosinophils Relative: 2 %
HCT: 37.5 % (ref 36.0–46.0)
Hemoglobin: 12 g/dL (ref 12.0–15.0)
Immature Granulocytes: 1 %
Lymphocytes Relative: 32 %
Lymphs Abs: 1.2 10*3/uL (ref 0.7–4.0)
MCH: 29.3 pg (ref 26.0–34.0)
MCHC: 32 g/dL (ref 30.0–36.0)
MCV: 91.5 fL (ref 80.0–100.0)
Monocytes Absolute: 0.5 10*3/uL (ref 0.1–1.0)
Monocytes Relative: 12 %
Neutro Abs: 2 10*3/uL (ref 1.7–7.7)
Neutrophils Relative %: 52 %
Platelet Count: 127 10*3/uL — ABNORMAL LOW (ref 150–400)
RBC: 4.1 MIL/uL (ref 3.87–5.11)
RDW: 13.4 % (ref 11.5–15.5)
WBC Count: 3.7 10*3/uL — ABNORMAL LOW (ref 4.0–10.5)
nRBC: 0 % (ref 0.0–0.2)

## 2019-04-18 NOTE — Progress Notes (Signed)
  [facility] OFFICE PROGRESS NOTE   Diagnosis: Non-Hodgkin's lymphoma  INTERVAL HISTORY:   Amanda Sellers returns as scheduled.  She feels well.  No palpable lymph nodes.  Good appetite.  She continues to work 3 days/week.  She has received both doses of the COVID-19 vaccine.  Objective:  Vital signs in last 24 hours:  Blood pressure (!) 173/60, pulse 92, temperature 98.5 F (36.9 C), temperature source Temporal, resp. rate 17, height '5\' 4"'$  (1.626 m), weight 213 lb 4.8 oz (96.8 kg), SpO2 97 %.  Lymphatics: No cervical, supraclavicular, axillary, or inguinal nodes GI: No hepatosplenomegaly, no mass, nontender Vascular: Trace pitting edema at the lower leg bilaterally    Lab Results:  Lab Results  Component Value Date   WBC 3.7 (L) 04/18/2019   HGB 12.0 04/18/2019   HCT 37.5 04/18/2019   MCV 91.5 04/18/2019   PLT 127 (L) 04/18/2019   NEUTROABS 2.0 04/18/2019    CMP  Lab Results  Component Value Date   NA 140 03/11/2019   K 4.1 03/11/2019   CL 99 03/11/2019   CO2 30 03/11/2019   GLUCOSE 126 (H) 03/11/2019   BUN 15 03/11/2019   CREATININE 1.15 (H) 03/11/2019   CALCIUM 9.1 03/11/2019   PROT 6.7 03/11/2019   ALBUMIN 4.4 03/11/2019   AST 35 03/11/2019   ALT 31 03/11/2019   ALKPHOS 54 03/11/2019   BILITOT 0.9 03/11/2019   GFRNONAA 44 (L) 03/11/2019   GFRAA 51 (L) 03/11/2019    Medications: I have reviewed the patient's current medications.   Assessment/Plan: 1.Non-Hodgkin's Lymphoma-bone marrow biopsy 07/24/2014 consistent with involvement by follicular B-cell lymphoma, CD20 positive   CTs of the chest 07/22/2014 and CT of the abdomen and pelvis on 07/23/2014-pulmonary nodules, hilar/mediastinal/supraclavicular/axillary adenopathy, splenomegaly, abdominal adenopathy, bilateral adrenal nodules   Cycle 1 bendamustine/rituximab 08/04/2014  Cycle 2 bendamustine/Rituxan 09/01/2014  Cycle 3 bendamustine/Rituxan 09/30/2014  Cycle 4  bendamustine/rituximab 10/27/2014  Restaging CT scans 11/17/2014 with significant improvement in diffuse lymphadenopathy, lung nodules, and renal lesions 2. Severe microcytic anemia, status post transfusion with packed red blood cells 07/23/2014   Normal ferritin, low transferrin saturation, "scant" bone marrow iron stores, review of peripheral blood smear consistent with iron deficiency anemia   colonoscopy and upper endoscopy 09/23/2014 3. Exertional dyspnea /orthopnea-most likely secondary to congestive heart failure  4. History of B-12 deficiency  5. Rash over the trunk and extremities 08/18/2014. Appears consistent with a drug rash. Resolved 08/28/2014.  Rash 11/18/2014, likely secondary to a contrast allergy, diagnostic contrast will be listed as an allergy 6. Colonoscopy 06/03/2008. Medium sized internal hemorrhoids.  7. Admission with atrial flutter with a rapid ventricular response 10/15/2014-status post cardioversion 10/16/2014, maintained on  apixaban  Repeat cardioversion 11/30/2014  8. Right olecranon skin lesion-likely a lipoma or cyst 9. 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 appears unchanged.  There is no clinical evidence for progression of lymphoma.  She will return for an office visit in 8 months.  [name], MD  04/18/2019  11:35 AM

## 2019-04-21 ENCOUNTER — Telehealth: Payer: Self-pay | Admitting: Oncology

## 2019-04-21 NOTE — Telephone Encounter (Signed)
Scheduled per los. Called and left msg. Mailed printout  °

## 2019-05-05 ENCOUNTER — Ambulatory Visit: Payer: PPO | Admitting: Family Medicine

## 2019-05-05 DIAGNOSIS — Z0289 Encounter for other administrative examinations: Secondary | ICD-10-CM

## 2019-05-05 NOTE — Progress Notes (Deleted)
OFFICE VISIT  05/05/2019   CC: No chief complaint on file.    HPI:    Patient is a 86 y.o. Caucasian female who presents for 3 mo f/u HTN, hypothyroidism, CRI III, and anxiety. A/P as of last visit: ") Cataracts->doing great s/p extraction OU.  2) CRI III: hydrating well and avoiding NSAIDs. BMET today.  3) Mild low WBC and platelets: monitor CBC today.  4) HTN: mild elevation here as usual, NORMAL per home monitoring. BMET today.  No med changes.  5) Hypothyrodism: last TSH a little high but no dose change b/c pt feels good and she has a-fib. Recheck TSH today.  6) Vit b12 def: has been on IM q mo, but not the last 2 mo b/c no med. Recheck b12 level today. If b12 ok, start oral in place of IM."  Interim hx: *** Has had recent f/u of follicular lymphoma by hem/onc-->no sign of dz progression. Recent cardiology f/u: no change in a-fib or chronic diastolic HF mgmt.  B12 at low normal last visit so I got her back on IM B12->most recent injection was ***  Past Medical History:  Diagnosis Date  . Anemia of chronic disease   . Anticoagulation adequate, Eliquis 10/16/2014  . Anxiety    clonaz helps this AND her breathing  . Chronic diastolic congestive heart failure ([place]) 48/54/6270   Complicated by atrial fibrillation; Echo 07/23/14: mild LVH, EF 50-55%, grade 2 diast dysfxn.  NYHA class II.  Marland Kitchen Chronic renal insufficiency, stage III (moderate)    GFR @ 50  . COPD (chronic obstructive pulmonary disease) ([place])    recently noted on Xray, does not have any problems  . Follicular lymphoma ([employer])    Non Hodgkins B cell lymphoma; s/p chemo summer 2016--in remission still as of 09/2018: continue obs q 43mo . HLD (hyperlipidemia)     06/2014  . HTN (hypertension)   . Hypothyroidism   . IFG (impaired fasting glucose)   . Lip cancer    Dr. BJanace Hoardexcised this: invasive SCC--no sign of cancer at ENT f/u 10/2015  . Microcytic anemia    transfused 3 U total in hosp 07/2014  .  Obesity (BMI 30-39.9)   . Olecranon bursitis of right elbow 02/2015  . [place]ancytopenia due to [place]ineoplastic [place]   . Persistent atrial fibrillation (HCC)    With RVR; elec cardioversion 11/30/14.  On Amio since 10/2014; Dr. NLenna Gilfordfollowing her from pulm standpoint regarding this med.  .Marland KitchenPositive occult stool blood test 08/08/14   Endoscopies ok 08/2014  . Primary osteoarthritis of both knees 04/2016   Dr. GGladstone Lighter bone on bone--to get euflexxa to both knees.  . Pulmonary metastases ([place] 07/24/2014  . Shortness of breath dyspnea    with exertion.  Dr. NLenna Gilfordstarted her on clonazepam to try to relax her chest wall and allow more satisfying deep breath. (03/2016).  . Vitamin B 12 deficiency   . Vitamin D deficiency    dose increased to 50 K U tab TWICE weekly 04/2017    Past Surgical History:  Procedure Laterality Date  . ABDOMINAL HYSTERECTOMY  1978  . BONE MARROW BIOPSY  07/24/14  . BREAST BIOPSY    . CARDIOVERSION N/A 10/16/2014   Procedure: CARDIOVERSION;  Surgeon: BLelon Perla MD;  [facility];  Service: Cardiovascular;  Laterality: N/A;  . CARDIOVERSION N/A 11/30/2014   Procedure: CARDIOVERSION;  Surgeon: DLarey Dresser MD;  [place]  Service: Cardiovascular;  Laterality: N/A;  .  CATARACT EXTRACTION  2020   OU: Dr. Katy Fitch  . COLONOSCOPY  09/23/14   small hemorrhoids, otherwise normal (performed for IDA and heme+ stool)  . COLONOSCOPY    . MASS EXCISION Left 07/15/2015   Left lower lip mass--invasive SCC w/negative margins.  Procedure: EXCISION MASS;  Surgeon: Melissa Montane, MD;  Location: [place];  Service: ENT;  Laterality: Left;  Wedge excision left lower lip mass  . NM MYOVIEW LTD  10/29/2014   medium size mild surgery defect in the mid anterior and apical anterior location suggestive of breast attenuation. No reversibility. LOW RISK  . PFTs  02/2015   Restriction with diffusion defect: cardiologist referred her to pulm to help interpret PFTs and  decide whether she has amiodarone toxicity  . TEE WITHOUT CARDIOVERSION N/A 10/16/2014   Procedure: TRANSESOPHAGEAL ECHOCARDIOGRAM (TEE);  Surgeon: Lelon Perla, MD;  Location: [facility] END[facility]COPY;  Service: Cardiovascular;  Laterality: N/A;  . TRANSTHORACIC ECHOCARDIOGRAM  07/23/14   mild LVH, EF 50-55%, grade 2 diast dysfxn  . UPPER GI ENDOSCOPY  09/23/2014   small hiatus hernia, nodules in stomach biopsied (chronic active erosive atrophic gastritis with intestinal metaplasia--no dysplasia or malignancy) otherwise normal    Outpatient Medications Prior to Visit  Medication Sig Dispense Refill  . ALPRAZolam (XANAX) 0.5 MG tablet 1 tab po qhs prn anxiety-related insomnia 30 tablet 0  . amiodarone (PACERONE) 200 MG tablet Take 0.5 tablets (100 mg total) by mouth daily. 90 tablet 2  . clonazePAM (KLONOPIN) 0.5 MG tablet TAKE 1/2 TO 1 TABLET BY MOUTH TWICE A DAY AS NEEDED 90 tablet 1  . cyanocobalamin (,VITAMIN B-12,) 1000 MCG/ML injection INJECT 1 ML (1000 MCG TOTAL) INTO THE SKIN EVERY 30 DAYS. 10 mL 0  . ELIQUIS 5 MG TABS tablet TAKE 1 TABLET BY MOUTH TWICE A DAY 180 tablet 2  . ferrous sulfate 325 (65 FE) MG tablet Take 1 tablet (325 mg total) by mouth 2 (two) times daily with a meal. 30 tablet 1  . fish oil-omega-3 fatty acids 1000 MG capsule Take 2 g by mouth daily.    . furosemide (LASIX) 40 MG tablet TAKE 1.5 TABLETS (60 MG TOTAL) BY MOUTH 2 (TWO) TIMES DAILY. 270 tablet 1  . levothyroxine (SYNTHROID) 100 MCG tablet TAKE 1 TABLET BY MOUTH EVERY DAY BEFORE BREAKFAST 90 tablet 0  . OXYGEN Inhale into the lungs. 1Lt at bedtime    . potassium chloride 20 MEQ TBCR Take 20 mEq by mouth daily. 30 tablet 3  . pravastatin (PRAVACHOL) 80 MG tablet TAKE 1 TABLET BY MOUTH EVERY DAY 90 tablet 1  . traMADol (ULTRAM) 50 MG tablet TAKE 1 TABLET BY MOUTH TWICE A DAY AS NEEDED FOR PAIN 60 tablet 3  . valsartan (DIOVAN) 40 MG tablet TAKE 1 TABLET BY MOUTH EVERY DAY 30 tablet 6  . Vitamin D, Ergocalciferol,  (DRISDOL) 1.25 MG (50000 UT) CAPS capsule TAKE 1 CAPSULE (50,000 UNITS TOTAL) BY MOUTH 2 (TWO) TIMES A WEEK. 24 capsule 3   No facility-administered medications prior to visit.    Allergies  Allergen Reactions  . Iodinated Diagnostic Agents Rash and Other (See Comments)    CT Contrast    ROS As per HPI  PE: There were no vitals taken for this visit. ***  LABS:  Lab Results  Component Value Date   TSH 3.41 02/03/2019   Lab Results  Component Value Date   WBC 3.7 (L) 04/18/2019   HGB 12.0 04/18/2019   HCT 37.5 04/18/2019  MCV 91.5 04/18/2019   PLT 127 (L) 04/18/2019   Lab Results  Component Value Date   CREATININE 1.15 (H) 03/11/2019   BUN 15 03/11/2019   NA 140 03/11/2019   K 4.1 03/11/2019   CL 99 03/11/2019   CO2 30 03/11/2019   Lab Results  Component Value Date   ALT 31 03/11/2019   AST 35 03/11/2019   ALKPHOS 54 03/11/2019   BILITOT 0.9 03/11/2019   Lab Results  Component Value Date   CHOL 209 (H) 11/04/2018   Lab Results  Component Value Date   HDL 62.70 11/04/2018   Lab Results  Component Value Date   LDLCALC 107 (H) 11/04/2018   Lab Results  Component Value Date   TRIG 196.0 (H) 11/04/2018   Lab Results  Component Value Date   CHOLHDL 3 11/04/2018   Lab Results  Component Value Date   HGBA1C 5.6 11/04/2018    IMPRESSION AND PLAN:  No problem-specific Assessment & Plan notes found for this encounter.   An After Visit Summary was printed and given to the patient.  FOLLOW UP: No follow-ups on file.  Signed:  Crissie Sickles, MD           05/05/2019

## 2019-05-15 ENCOUNTER — Other Ambulatory Visit: Payer: Self-pay

## 2019-05-15 ENCOUNTER — Inpatient Hospital Stay (HOSPITAL_COMMUNITY): Payer: PPO

## 2019-05-15 ENCOUNTER — Inpatient Hospital Stay (HOSPITAL_COMMUNITY)
Admission: EM | Admit: 2019-05-15 | Discharge: 2019-05-21 | DRG: 286 | Disposition: A | Discharge status: Home / Self Care | Payer: PPO | Attending: Cardiovascular Disease | Admitting: Cardiovascular Disease

## 2019-05-15 ENCOUNTER — Emergency Department (HOSPITAL_COMMUNITY): Payer: PPO

## 2019-05-15 ENCOUNTER — Encounter (HOSPITAL_COMMUNITY): Payer: Self-pay | Admitting: Emergency Medicine

## 2019-05-15 DIAGNOSIS — Z8572 Personal history of non-Hodgkin lymphomas: Secondary | ICD-10-CM

## 2019-05-15 DIAGNOSIS — N1831 Chronic kidney disease, stage 3a: Secondary | ICD-10-CM | POA: Diagnosis not present

## 2019-05-15 DIAGNOSIS — Z85819 Personal history of malignant neoplasm of unspecified site of lip, oral cavity, and pharynx: Secondary | ICD-10-CM

## 2019-05-15 DIAGNOSIS — Z6833 Body mass index (BMI) 33.0-33.9, adult: Secondary | ICD-10-CM

## 2019-05-15 DIAGNOSIS — I440 Atrioventricular block, first degree: Secondary | ICD-10-CM | POA: Diagnosis present

## 2019-05-15 DIAGNOSIS — R0902 Hypoxemia: Secondary | ICD-10-CM | POA: Diagnosis not present

## 2019-05-15 DIAGNOSIS — I509 Heart failure, unspecified: Secondary | ICD-10-CM

## 2019-05-15 DIAGNOSIS — G4733 Obstructive sleep apnea (adult) (pediatric): Secondary | ICD-10-CM | POA: Diagnosis not present

## 2019-05-15 DIAGNOSIS — I447 Left bundle-branch block, unspecified: Secondary | ICD-10-CM | POA: Diagnosis present

## 2019-05-15 DIAGNOSIS — Z9981 Dependence on supplemental oxygen: Secondary | ICD-10-CM

## 2019-05-15 DIAGNOSIS — Z79899 Other long term (current) drug therapy: Secondary | ICD-10-CM | POA: Diagnosis not present

## 2019-05-15 DIAGNOSIS — I25119 Atherosclerotic heart disease of native coronary artery with unspecified angina pectoris: Secondary | ICD-10-CM | POA: Diagnosis present

## 2019-05-15 DIAGNOSIS — I5043 Acute on chronic combined systolic (congestive) and diastolic (congestive) heart failure: Secondary | ICD-10-CM | POA: Diagnosis not present

## 2019-05-15 DIAGNOSIS — I272 Pulmonary hypertension, unspecified: Secondary | ICD-10-CM | POA: Diagnosis not present

## 2019-05-15 DIAGNOSIS — Z79890 Hormone replacement therapy (postmenopausal): Secondary | ICD-10-CM | POA: Diagnosis not present

## 2019-05-15 DIAGNOSIS — Z808 Family history of malignant neoplasm of other organs or systems: Secondary | ICD-10-CM

## 2019-05-15 DIAGNOSIS — I340 Nonrheumatic mitral (valve) insufficiency: Secondary | ICD-10-CM | POA: Diagnosis not present

## 2019-05-15 DIAGNOSIS — J9601 Acute respiratory failure with hypoxia: Secondary | ICD-10-CM | POA: Diagnosis not present

## 2019-05-15 DIAGNOSIS — I2722 Pulmonary hypertension due to left heart disease: Secondary | ICD-10-CM

## 2019-05-15 DIAGNOSIS — E538 Deficiency of other specified B group vitamins: Secondary | ICD-10-CM | POA: Diagnosis present

## 2019-05-15 DIAGNOSIS — I110 Hypertensive heart disease with heart failure: Secondary | ICD-10-CM | POA: Diagnosis not present

## 2019-05-15 DIAGNOSIS — E876 Hypokalemia: Secondary | ICD-10-CM | POA: Diagnosis not present

## 2019-05-15 DIAGNOSIS — R0689 Other abnormalities of breathing: Secondary | ICD-10-CM | POA: Diagnosis not present

## 2019-05-15 DIAGNOSIS — I10 Essential (primary) hypertension: Secondary | ICD-10-CM

## 2019-05-15 DIAGNOSIS — I361 Nonrheumatic tricuspid (valve) insufficiency: Secondary | ICD-10-CM | POA: Diagnosis not present

## 2019-05-15 DIAGNOSIS — I248 Other forms of acute ischemic heart disease: Secondary | ICD-10-CM | POA: Diagnosis not present

## 2019-05-15 DIAGNOSIS — Z800 Family history of malignant neoplasm of digestive organs: Secondary | ICD-10-CM

## 2019-05-15 DIAGNOSIS — Z9221 Personal history of antineoplastic chemotherapy: Secondary | ICD-10-CM | POA: Diagnosis not present

## 2019-05-15 DIAGNOSIS — R069 Unspecified abnormalities of breathing: Secondary | ICD-10-CM | POA: Diagnosis not present

## 2019-05-15 DIAGNOSIS — I130 Hypertensive heart and chronic kidney disease with heart failure and stage 1 through stage 4 chronic kidney disease, or unspecified chronic kidney disease: Principal | ICD-10-CM | POA: Diagnosis present

## 2019-05-15 DIAGNOSIS — J449 Chronic obstructive pulmonary disease, unspecified: Secondary | ICD-10-CM | POA: Insufficient documentation

## 2019-05-15 DIAGNOSIS — I209 Angina pectoris, unspecified: Secondary | ICD-10-CM

## 2019-05-15 DIAGNOSIS — I5041 Acute combined systolic (congestive) and diastolic (congestive) heart failure: Secondary | ICD-10-CM | POA: Diagnosis not present

## 2019-05-15 DIAGNOSIS — I480 Paroxysmal atrial fibrillation: Secondary | ICD-10-CM | POA: Diagnosis not present

## 2019-05-15 DIAGNOSIS — I2510 Atherosclerotic heart disease of native coronary artery without angina pectoris: Secondary | ICD-10-CM

## 2019-05-15 DIAGNOSIS — R778 Other specified abnormalities of plasma proteins: Secondary | ICD-10-CM

## 2019-05-15 DIAGNOSIS — E559 Vitamin D deficiency, unspecified: Secondary | ICD-10-CM | POA: Diagnosis present

## 2019-05-15 DIAGNOSIS — Z7901 Long term (current) use of anticoagulants: Secondary | ICD-10-CM | POA: Diagnosis not present

## 2019-05-15 DIAGNOSIS — Z807 Family history of other malignant neoplasms of lymphoid, hematopoietic and related tissues: Secondary | ICD-10-CM

## 2019-05-15 DIAGNOSIS — I959 Hypotension, unspecified: Secondary | ICD-10-CM | POA: Diagnosis not present

## 2019-05-15 DIAGNOSIS — Z833 Family history of diabetes mellitus: Secondary | ICD-10-CM

## 2019-05-15 DIAGNOSIS — D631 Anemia in chronic kidney disease: Secondary | ICD-10-CM | POA: Diagnosis not present

## 2019-05-15 DIAGNOSIS — J441 Chronic obstructive pulmonary disease with (acute) exacerbation: Secondary | ICD-10-CM | POA: Diagnosis present

## 2019-05-15 DIAGNOSIS — I420 Dilated cardiomyopathy: Secondary | ICD-10-CM | POA: Diagnosis not present

## 2019-05-15 DIAGNOSIS — E669 Obesity, unspecified: Secondary | ICD-10-CM | POA: Diagnosis not present

## 2019-05-15 DIAGNOSIS — Z20822 Contact with and (suspected) exposure to covid-19: Secondary | ICD-10-CM | POA: Diagnosis not present

## 2019-05-15 DIAGNOSIS — R062 Wheezing: Secondary | ICD-10-CM | POA: Diagnosis not present

## 2019-05-15 DIAGNOSIS — E785 Hyperlipidemia, unspecified: Secondary | ICD-10-CM | POA: Diagnosis present

## 2019-05-15 DIAGNOSIS — E039 Hypothyroidism, unspecified: Secondary | ICD-10-CM | POA: Diagnosis present

## 2019-05-15 DIAGNOSIS — Z886 Allergy status to analgesic agent status: Secondary | ICD-10-CM

## 2019-05-15 DIAGNOSIS — Z9989 Dependence on other enabling machines and devices: Secondary | ICD-10-CM

## 2019-05-15 DIAGNOSIS — F419 Anxiety disorder, unspecified: Secondary | ICD-10-CM | POA: Diagnosis present

## 2019-05-15 DIAGNOSIS — R0602 Shortness of breath: Secondary | ICD-10-CM | POA: Diagnosis not present

## 2019-05-15 DIAGNOSIS — C8590 Non-Hodgkin lymphoma, unspecified, unspecified site: Secondary | ICD-10-CM | POA: Diagnosis not present

## 2019-05-15 DIAGNOSIS — N183 Chronic kidney disease, stage 3 unspecified: Secondary | ICD-10-CM

## 2019-05-15 HISTORY — PX: TRANSTHORACIC ECHOCARDIOGRAM: SHX275

## 2019-05-15 LAB — COMPREHENSIVE METABOLIC PANEL
ALT: 49 U/L — ABNORMAL HIGH (ref 0–44)
AST: 53 U/L — ABNORMAL HIGH (ref 15–41)
Albumin: 4.3 g/dL (ref 3.5–5.0)
Alkaline Phosphatase: 52 U/L (ref 38–126)
Anion gap: 12 (ref 5–15)
BUN: 21 mg/dL (ref 8–23)
CO2: 30 mmol/L (ref 22–32)
Calcium: 8.7 mg/dL — ABNORMAL LOW (ref 8.9–10.3)
Chloride: 99 mmol/L (ref 98–111)
Creatinine, Ser: 1.01 mg/dL — ABNORMAL HIGH (ref 0.44–1.00)
GFR calc Af Amer: 59 mL/min — ABNORMAL LOW (ref >60)
GFR calc non Af Amer: 51 mL/min — ABNORMAL LOW (ref >60)
Glucose, Bld: 175 mg/dL — ABNORMAL HIGH (ref 70–99)
Potassium: 3.4 mmol/L — ABNORMAL LOW (ref 3.5–5.1)
Sodium: 141 mmol/L (ref 135–145)
Total Bilirubin: 0.8 mg/dL (ref 0.3–1.2)
Total Protein: 7 g/dL (ref 6.5–8.1)

## 2019-05-15 LAB — BLOOD GAS, ARTERIAL
Acid-Base Excess: 5.4 mmol/L — ABNORMAL HIGH (ref 0.0–2.0)
Bicarbonate: 28.5 mmol/L — ABNORMAL HIGH (ref 20.0–28.0)
FIO2: 100
O2 Saturation: 98.9 %
Patient temperature: 37
pCO2 arterial: 52.3 mmHg — ABNORMAL HIGH (ref 32.0–48.0)
pH, Arterial: 7.381 (ref 7.350–7.450)
pO2, Arterial: 159 mmHg — ABNORMAL HIGH (ref 83.0–108.0)

## 2019-05-15 LAB — TROPONIN I (HIGH SENSITIVITY)
Troponin I (High Sensitivity): 17 ng/L (ref <18)
Troponin I (High Sensitivity): 165 ng/L (ref <18)
Troponin I (High Sensitivity): 352 ng/L (ref <18)
Troponin I (High Sensitivity): 319 ng/L (ref <18)

## 2019-05-15 LAB — CBC WITH DIFFERENTIAL/PLATELET
Abs Immature Granulocytes: 0.02 10*3/uL (ref 0.00–0.07)
Basophils Absolute: 0 10*3/uL (ref 0.0–0.1)
Basophils Relative: 0 %
Eosinophils Absolute: 0.1 10*3/uL (ref 0.0–0.5)
Eosinophils Relative: 3 %
HCT: 44.9 % (ref 36.0–46.0)
Hemoglobin: 13.7 g/dL (ref 12.0–15.0)
Immature Granulocytes: 0 %
Lymphocytes Relative: 40 %
Lymphs Abs: 2.1 10*3/uL (ref 0.7–4.0)
MCH: 29 pg (ref 26.0–34.0)
MCHC: 30.5 g/dL (ref 30.0–36.0)
MCV: 94.9 fL (ref 80.0–100.0)
Monocytes Absolute: 0.5 10*3/uL (ref 0.1–1.0)
Monocytes Relative: 9 %
Neutro Abs: 2.5 10*3/uL (ref 1.7–7.7)
Neutrophils Relative %: 48 %
Platelets: 141 10*3/uL — ABNORMAL LOW (ref 150–400)
RBC: 4.73 MIL/uL (ref 3.87–5.11)
RDW: 13.6 % (ref 11.5–15.5)
WBC: 5.2 10*3/uL (ref 4.0–10.5)
nRBC: 0 % (ref 0.0–0.2)

## 2019-05-15 LAB — RESPIRATORY PANEL BY RT PCR (FLU A&B, COVID)
Influenza A by PCR: NEGATIVE
Influenza B by PCR: NEGATIVE
SARS Coronavirus 2 by RT PCR: NEGATIVE

## 2019-05-15 LAB — ECHOCARDIOGRAM COMPLETE
Height: 66 in
Weight: 3368.63 oz

## 2019-05-15 LAB — APTT
aPTT: 29 seconds (ref 24–36)
aPTT: 99 seconds — ABNORMAL HIGH (ref 24–36)

## 2019-05-15 LAB — PROTIME-INR
INR: 1.1 (ref 0.8–1.2)
Prothrombin Time: 13.8 seconds (ref 11.4–15.2)

## 2019-05-15 LAB — MAGNESIUM: Magnesium: 2.6 mg/dL — ABNORMAL HIGH (ref 1.7–2.4)

## 2019-05-15 LAB — BRAIN NATRIURETIC PEPTIDE: B Natriuretic Peptide: 138 pg/mL — ABNORMAL HIGH (ref 0.0–100.0)

## 2019-05-15 LAB — HEPARIN LEVEL (UNFRACTIONATED): Heparin Unfractionated: 1.42 IU/mL — ABNORMAL HIGH (ref 0.30–0.70)

## 2019-05-15 MED ORDER — SODIUM CHLORIDE 0.9% FLUSH
3.0000 mL | Freq: Two times a day (BID) | INTRAVENOUS | Status: DC
  Administered 2019-05-17 – 2019-05-18 (×2): 3 mL via INTRAVENOUS

## 2019-05-15 MED ORDER — HEPARIN BOLUS VIA INFUSION
4000.0000 [IU] | Freq: Once | INTRAVENOUS | Status: AC
  Administered 2019-05-15: 4000 [IU] via INTRAVENOUS

## 2019-05-15 MED ORDER — FUROSEMIDE 10 MG/ML IJ SOLN
60.0000 mg | Freq: Two times a day (BID) | INTRAMUSCULAR | Status: DC
  Administered 2019-05-15 – 2019-05-17 (×4): 60 mg via INTRAVENOUS
  Filled 2019-05-15 (×4): qty 6

## 2019-05-15 MED ORDER — NITROGLYCERIN 2 % TD OINT
1.0000 [in_us] | TOPICAL_OINTMENT | Freq: Once | TRANSDERMAL | Status: AC
  Administered 2019-05-15: 05:00:00 1 [in_us] via TOPICAL
  Filled 2019-05-15: qty 1

## 2019-05-15 MED ORDER — PRAVASTATIN SODIUM 40 MG PO TABS
80.0000 mg | ORAL_TABLET | Freq: Every day | ORAL | Status: DC
  Administered 2019-05-15 – 2019-05-19 (×5): 80 mg via ORAL
  Filled 2019-05-15 (×5): qty 2

## 2019-05-15 MED ORDER — ONDANSETRON HCL 4 MG PO TABS: 4.0000 mg | ORAL_TABLET | Freq: Four times a day (QID) | ORAL | Status: DC | PRN

## 2019-05-15 MED ORDER — IPRATROPIUM-ALBUTEROL 0.5-2.5 (3) MG/3ML IN SOLN: 3.0000 mL | Freq: Four times a day (QID) | RESPIRATORY_TRACT | Status: DC | PRN

## 2019-05-15 MED ORDER — ALBUTEROL SULFATE HFA 108 (90 BASE) MCG/ACT IN AERS
INHALATION_SPRAY | RESPIRATORY_TRACT | Status: AC
  Filled 2019-05-15: qty 6.7

## 2019-05-15 MED ORDER — POTASSIUM CHLORIDE CRYS ER 20 MEQ PO TBCR
40.0000 meq | EXTENDED_RELEASE_TABLET | Freq: Two times a day (BID) | ORAL | Status: DC
  Administered 2019-05-15 – 2019-05-17 (×5): 40 meq via ORAL
  Filled 2019-05-15 (×5): qty 2

## 2019-05-15 MED ORDER — ALBUTEROL SULFATE HFA 108 (90 BASE) MCG/ACT IN AERS: 8.0000 | INHALATION_SPRAY | Freq: Once | RESPIRATORY_TRACT | Status: DC

## 2019-05-15 MED ORDER — HEPARIN (PORCINE) 25000 UT/250ML-% IV SOLN
1000.0000 [IU]/h | INTRAVENOUS | Status: DC
  Administered 2019-05-15 – 2019-05-18 (×4): 1000 [IU]/h via INTRAVENOUS
  Filled 2019-05-15 (×4): qty 250

## 2019-05-15 MED ORDER — ONDANSETRON HCL 4 MG/2ML IJ SOLN: 4.0000 mg | Freq: Four times a day (QID) | INTRAMUSCULAR | Status: DC | PRN

## 2019-05-15 MED ORDER — FERROUS SULFATE 325 (65 FE) MG PO TABS
325.0000 mg | ORAL_TABLET | Freq: Two times a day (BID) | ORAL | Status: DC
  Administered 2019-05-15 – 2019-05-21 (×12): 325 mg via ORAL
  Filled 2019-05-15 (×12): qty 1

## 2019-05-15 MED ORDER — ALPRAZOLAM 0.5 MG PO TABS
0.5000 mg | ORAL_TABLET | Freq: Every evening | ORAL | Status: DC | PRN
  Administered 2019-05-21: 0.5 mg via ORAL
  Filled 2019-05-15: qty 1

## 2019-05-15 MED ORDER — SODIUM CHLORIDE 0.9 % IV SOLN: 250.0000 mL | INTRAVENOUS | Status: DC | PRN

## 2019-05-15 MED ORDER — LEVOTHYROXINE SODIUM 100 MCG PO TABS
100.0000 ug | ORAL_TABLET | Freq: Every day | ORAL | Status: DC
  Administered 2019-05-16 – 2019-05-21 (×6): 100 ug via ORAL
  Filled 2019-05-15 (×6): qty 1

## 2019-05-15 MED ORDER — TRAMADOL HCL 50 MG PO TABS: 50.0000 mg | ORAL_TABLET | Freq: Two times a day (BID) | ORAL | Status: DC | PRN

## 2019-05-15 MED ORDER — ACETAMINOPHEN 650 MG RE SUPP: 650.0000 mg | Freq: Four times a day (QID) | RECTAL | Status: DC | PRN

## 2019-05-15 MED ORDER — AMIODARONE HCL 100 MG PO TABS
100.0000 mg | ORAL_TABLET | Freq: Every day | ORAL | Status: DC
  Administered 2019-05-15 – 2019-05-21 (×7): 100 mg via ORAL
  Filled 2019-05-15 (×7): qty 1

## 2019-05-15 MED ORDER — FUROSEMIDE 10 MG/ML IJ SOLN
80.0000 mg | Freq: Once | INTRAMUSCULAR | Status: AC
  Administered 2019-05-15: 80 mg via INTRAVENOUS
  Filled 2019-05-15: qty 8

## 2019-05-15 MED ORDER — SODIUM CHLORIDE 0.9% FLUSH: 3.0000 mL | INTRAVENOUS | Status: DC | PRN

## 2019-05-15 MED ORDER — ACETAMINOPHEN 325 MG PO TABS: 650.0000 mg | ORAL_TABLET | Freq: Four times a day (QID) | ORAL | Status: DC | PRN

## 2019-05-15 NOTE — ED Provider Notes (Signed)
Blood pressure 114/69, pulse 73, resp. rate 20, height 5\' 6"  (1.676 m), weight 95.5 kg, SpO2 98 %.  In short, Amanda Sellers is a 86 y.o. female with a chief complaint of Respiratory Distress .  Refer to the original H&P for additional details.   EKG Interpretation  Date/Time:  Thursday May 15 2019 04:57:44 EDT Ventricular Rate:  90 PR Interval:    QRS Duration: 153 QT Interval:  418 QTC Calculation: 512 R Axis:   86 Text Interpretation: Sinus rhythm Borderline prolonged PR interval LVH with secondary repolarization abnormality Anterior infarct, old Prolonged QT interval No significant change since last tracing 11 Mar 2019 Confirmed by Rolland Porter [phone]) on 05/15/2019 5:00:55 AM      09:58 AM  Following up on patient who does not have admission orders written by the hospitalist team.  In contacting them of the night hospitalist apparently deferred the admission pending ABG but in review of the EDP note this is not clear.  I did not receive direct signout on this patient other than then hospitalist is will admit.  Patient has been weaned off of BiPAP and is currently on 3 L nasal cannula.  Troponin slightly elevated to 165 likely secondary to acute CHF exacerbation.  She is having good urine output with Lasix.  Covid is negative.   Discussed patient's case with [facility], Dr. Manuella Ghazi to request admission. Patient and family (if present) updated with plan. Care transferred to [facility] service.  I reviewed all nursing notes, vitals, pertinent old records, EKGs, labs, imaging (as available).     Margette Fast, MD 05/15/19 1000

## 2019-05-15 NOTE — Progress Notes (Deleted)
Patient has Bipap continuous order.  Patient has been off Bipap most of the day.  Patient stated that she is feeling better.  Currently on 2L [place] with a sat of 93 to 95%, HR at 71, no distress noted at this time.  Modified order to PRN, so if patient needs Bipap again, we can provide that for her.  Will continue to monitor.

## 2019-05-15 NOTE — H&P (Addendum)
History and Physical    Amanda Sellers ZOX:[id number] DOB: 1932/06/16 DOA: 05/15/2019  PCP: Tammi Sou, MD   Patient coming from: Home  Chief Complaint: Shortness of breath  HPI: Amanda Sellers is a 86 y.o. female with medical history significant for hypertension, hypothyroidism, CKD stage III, atrial fibrillation on Eliquis, COPD on intermittent home oxygen, OSA with obesity on CPAP, non-Hodgkin's lymphoma, anemia of chronic disease, dyslipidemia, and diastolic heart failure who presented to the ED with acute onset of shortness of breath that began at approximately 4 AM.  Her pulse oximetry at home was approximately 70% and once EMS arrived they had placed her on nonrebreather mask and she was still noted to have saturations in the mid 80th percentile.  She had denied any chest pain initially, but now claims that she has had some intermittent substernal chest pain with radiation to her left arm over the last couple weeks.  Her legs have demonstrated swelling and she does not check her weight regularly.  She claims to be taking her home Lasix 60 mg twice daily and has had good urine output.   ED Course: Stable vital signs noted and patient is in sinus rhythm.  She was initially placed on BiPAP given her respiratory distress and ABG did not appear to demonstrate any acute findings.  Chest x-ray with cardiomegaly and new bilateral pulmonary edema and BNP is 138.  Troponin has trended up to 352 from 17 initially and she denies any chest pain.  She has been started on heparin drip.  EKG was sinus rhythm at 90 bpm noted she is weaned off BiPAP and currently on nasal cannula after being given Lasix 80 mg IV for diuresis.  She has had good urine output.  Covid testing negative.  Given her troponin elevation and intermittent chest pain she has been started on heparin drip and cardiology consultation pending.  Review of Systems: All others reviewed and otherwise negative except as noted  above.  Past Medical History:  Diagnosis Date  . Anemia of chronic disease   . Anticoagulation adequate, Eliquis 10/16/2014  . Anxiety    clonaz helps this AND her breathing  . Chronic diastolic congestive heart failure ([place]) 81/19/1478   Complicated by atrial fibrillation; Echo 07/23/14: mild LVH, EF 50-55%, grade 2 diast dysfxn.  NYHA class II.  Marland Kitchen Chronic renal insufficiency, stage III (moderate)    GFR @ 50  . COPD (chronic obstructive pulmonary disease) ([place])    recently noted on Xray, does not have any problems  . Follicular lymphoma ([place])    Non Hodgkins B cell lymphoma; s/p chemo summer 2016--in remission still as of 09/2018: continue obs q 48mo . HLD (hyperlipidemia)     06/2014  . HTN (hypertension)   . Hypothyroidism   . IFG (impaired fasting glucose)   . Lip cancer    Dr. BJanace Hoardexcised this: invasive SCC--no sign of cancer at ENT f/u 10/2015  . Microcytic anemia    transfused 3 U total in hosp 07/2014  . Obesity (BMI 30-39.9)   . Olecranon bursitis of right elbow 02/2015  . Pancytopenia due to antineoplastic chemotherapy ([place]   . Persistent atrial fibrillation (HCC)    With RVR; elec cardioversion 11/30/14.  On Amio since 10/2014; Dr. NLenna Gilfordfollowing her from pulm standpoint regarding this med.  .Marland KitchenPositive occult stool blood test 08/08/14   Endoscopies ok 08/2014  . Primary osteoarthritis of both knees 04/2016   Dr. GGladstone Lighter bone on bone--to get  euflexxa to both knees.  . Pulmonary metastases ([place]) 07/24/2014  . Shortness of breath dyspnea    with exertion.  Dr. Lenna Gilford started her on clonazepam to try to relax her chest wall and allow more satisfying deep breath. (03/2016).  . Vitamin B 12 deficiency   . Vitamin D deficiency    dose increased to 50 K U tab TWICE weekly 04/2017    Past Surgical History:  Procedure Laterality Date  . ABDOMINAL HYSTERECTOMY  1978  . BONE MARROW BIOPSY  07/24/14  . BREAST BIOPSY    . CARDIOVERSION N/A 10/16/2014   Procedure: CARDIOVERSION;   Surgeon: Lelon Perla, MD;  [facility];  Service: Cardiovascular;  Laterality: N/A;  . CARDIOVERSION N/A 11/30/2014   Procedure: CARDIOVERSION;  Surgeon: Larey Dresser, MD;  Location: [place];  Service: Cardiovascular;  Laterality: N/A;  . CATARACT EXTRACTION  2020   OU: Dr. Katy Fitch  . COLONOSCOPY  09/23/14   small hemorrhoids, otherwise normal (performed for IDA and heme+ stool)  . COLONOSCOPY    . MASS EXCISION Left 07/15/2015   Left lower lip mass--invasive SCC w/negative margins.  Procedure: EXCISION MASS;  Surgeon: Melissa Montane, MD;  Location: [place];  Service: ENT;  Laterality: Left;  Wedge excision left lower lip mass  . NM MYOVIEW LTD  10/29/2014   medium size mild surgery defect in the mid anterior and apical anterior location suggestive of breast attenuation. No reversibility. LOW RISK  . PFTs  02/2015   Restriction with diffusion defect: cardiologist referred her to pulm to help interpret PFTs and decide whether she has amiodarone toxicity  . TEE WITHOUT CARDIOVERSION N/A 10/16/2014   Procedure: TRANSESOPHAGEAL ECHOCARDIOGRAM (TEE);  Surgeon: Lelon Perla, MD;  [facility] END[facility]COPY;  Service: Cardiovascular;  Laterality: N/A;  . TRANSTHORACIC ECHOCARDIOGRAM  07/23/14   mild LVH, EF 50-55%, grade 2 diast dysfxn  . UPPER GI ENDOSCOPY  09/23/2014   small hiatus hernia, nodules in stomach biopsied (chronic active erosive atrophic gastritis with intestinal metaplasia--no dysplasia or malignancy) otherwise normal     reports that she has never smoked. She has never used smokeless tobacco. She reports that she does not drink alcohol or use drugs.  Allergies  Allergen Reactions  . Iodinated Diagnostic Agents Rash and Other (See Comments)    CT Contrast    Family History  Problem Relation Age of Onset  . Melanoma Mother   . Stomach cancer Father   . Lymphoma Sister   . Diabetes Daughter     Prior to Admission medications   Medication Sig Start Date End Date  Taking? Authorizing Provider  ALPRAZolam (XANAX) 0.5 MG tablet 1 tab po qhs prn anxiety-related insomnia Patient taking differently: Take 0.5 mg by mouth at bedtime as needed. 1 tab po qhs prn anxiety-related insomnia 07/24/18  Yes Ladell Pier, MD  amiodarone (PACERONE) 200 MG tablet Take 0.5 tablets (100 mg total) by mouth daily. 03/11/19  Yes Sherran Needs, NP  clonazePAM (KLONOPIN) 0.5 MG tablet TAKE 1/2 TO 1 TABLET BY MOUTH TWICE A DAY AS NEEDED Patient taking differently: Take 0.25-0.5 mg by mouth 2 (two) times daily as needed. TAKE 1/2 TO 1 TABLET BY MOUTH TWICE A DAY AS NEEDED 12/18/18  Yes McGowen, Adrian Blackwater, MD  cyanocobalamin (,VITAMIN B-12,) 1000 MCG/ML injection INJECT 1 ML (1000 MCG TOTAL) INTO THE SKIN EVERY 30 DAYS. Patient taking differently: Inject 1,000 mcg into the skin every 30 (thirty) days. INJECT 1 ML (1000 MCG TOTAL)  INTO THE SKIN EVERY 30 DAYS. 02/03/19  Yes McGowen, Adrian Blackwater, MD  ELIQUIS 5 MG TABS tablet TAKE 1 TABLET BY MOUTH TWICE A DAY Patient taking differently: Take 5 mg by mouth 2 (two) times daily.  02/17/19  Yes Leonie Man, MD  ferrous sulfate 325 (65 FE) MG tablet Take 1 tablet (325 mg total) by mouth 2 (two) times daily with a meal. 07/29/14  Yes Ladell Pier, MD  fish oil-omega-3 fatty acids 1000 MG capsule Take 2 g by mouth daily.   Yes [provider]  furosemide (LASIX) 40 MG tablet TAKE 1.5 TABLETS (60 MG TOTAL) BY MOUTH 2 (TWO) TIMES DAILY. 11/11/18  Yes Sherran Needs, NP  levothyroxine (SYNTHROID) 100 MCG tablet TAKE 1 TABLET BY MOUTH EVERY DAY BEFORE BREAKFAST Patient taking differently: Take 100 mcg by mouth daily before breakfast.  02/19/19  Yes McGowen, Adrian Blackwater, MD  OXYGEN Inhale into the lungs. 1Lt at bedtime   Yes [provider]  potassium chloride 20 MEQ TBCR Take 20 mEq by mouth daily. 11/25/14  Yes Sherran Needs, NP  pravastatin (PRAVACHOL) 80 MG tablet TAKE 1 TABLET BY MOUTH EVERY DAY Patient taking differently:  Take 80 mg by mouth daily.  11/27/18  Yes McGowen, Adrian Blackwater, MD  traMADol (ULTRAM) 50 MG tablet TAKE 1 TABLET BY MOUTH TWICE A DAY AS NEEDED FOR PAIN Patient taking differently: Take 50 mg by mouth every 12 (twelve) hours as needed.  02/17/19  Yes McGowen, Adrian Blackwater, MD  valsartan (DIOVAN) 40 MG tablet TAKE 1 TABLET BY MOUTH EVERY DAY Patient taking differently: Take 40 mg by mouth daily.  12/20/18  Yes Leonie Man, MD  Vitamin D, Ergocalciferol, (DRISDOL) 1.25 MG (50000 UT) CAPS capsule TAKE 1 CAPSULE (50,000 UNITS TOTAL) BY MOUTH 2 (TWO) TIMES A WEEK. 10/10/18  Yes Tammi Sou, MD    Physical Exam: Vitals:   05/15/19 1230 05/15/19 1300 05/15/19 1400 05/15/19 1430  BP: (!) 110/57 125/69 116/66 122/70  Pulse: 74 78 76 78  Resp: (!) 24 (!) 21 (!) 23 (!) 25  SpO2: 98% 98% 97% 96%  Weight:      Height:        Constitutional: NAD, calm, comfortable Vitals:   05/15/19 1230 05/15/19 1300 05/15/19 1400 05/15/19 1430  BP: (!) 110/57 125/69 116/66 122/70  Pulse: 74 78 76 78  Resp: (!) 24 (!) 21 (!) 23 (!) 25  SpO2: 98% 98% 97% 96%  Weight:      Height:       Eyes: lids and conjunctivae normal ENMT: Mucous membranes are moist.  Neck: normal, supple Respiratory: clear to auscultation bilaterally. Normal respiratory effort. No accessory muscle use.  Currently on 3 L nasal cannula oxygen. Cardiovascular: Regular rate and rhythm, no murmurs.  Bilateral 1-2+ extremity edema to knees noted. Abdomen: no tenderness, no distention. Bowel sounds positive.  Musculoskeletal:  No joint deformity upper and lower extremities.   Skin: no rashes, lesions, ulcers.  Psychiatric: Normal judgment and insight. Alert and oriented x 3. Normal mood.   Labs on Admission: I have personally reviewed following labs and imaging studies  CBC: Recent Labs  Lab 05/15/19 0450  WBC 5.2  NEUTROABS 2.5  HGB 13.7  HCT 44.9  MCV 94.9  PLT 867*   Basic Metabolic Panel: Recent Labs  Lab 05/15/19 0450  NA  141  K 3.4*  CL 99  CO2 30  GLUCOSE 175*  BUN 21  CREATININE 1.01*  CALCIUM 8.7*  MG 2.6*   GFR: Estimated Creatinine Clearance: 48.3 mL/min (A) (by C-G formula based on SCr of 1.01 mg/dL (H)). Liver Function Tests: Recent Labs  Lab 05/15/19 0450  AST 53*  ALT 49*  ALKPHOS 52  BILITOT 0.8  PROT 7.0  ALBUMIN 4.3   No results for input(s): LIPASE, AMYLASE in the last 168 hours. No results for input(s): AMMONIA in the last 168 hours. Coagulation Profile: Recent Labs  Lab 05/15/19 1253  INR 1.1   Cardiac Enzymes: No results for input(s): CKTOTAL, CKMB, CKMBINDEX, TROPONINI in the last 168 hours. BNP (last 3 results) No results for input(s): PROBNP in the last 8760 hours. HbA1C: No results for input(s): HGBA1C in the last 72 hours. CBG: No results for input(s): GLUCAP in the last 168 hours. Lipid Profile: No results for input(s): CHOL, HDL, LDLCALC, TRIG, CHOLHDL, LDLDIRECT in the last 72 hours. Thyroid Function Tests: No results for input(s): TSH, T4TOTAL, FREET4, T3FREE, THYROIDAB in the last 72 hours. Anemia Panel: No results for input(s): VITAMINB12, FOLATE, FERRITIN, TIBC, IRON, RETICCTPCT in the last 72 hours. Urine analysis:    Component Value Date/Time   LABSPEC 1.015 10/09/2016 1300   PHURINE 6.0 10/09/2016 1300   GLUCOSEU Negative 10/09/2016 1300   HGBUR Negative 10/09/2016 1300   BILIRUBINUR Negative 10/09/2016 1300   KETONESUR Negative 10/09/2016 1300   PROTEINUR Negative 10/09/2016 1300   UROBILINOGEN 0.2 10/09/2016 1300   NITRITE Negative 10/09/2016 1300   LEUKOCYTESUR Negative 10/09/2016 1300    Radiological Exams on Admission: DG Chest Port 1 View  Result Date: 05/15/2019 CLINICAL DATA:  Shortness of breath. EXAM: PORTABLE CHEST 1 VIEW COMPARISON:  01/26/2017. FINDINGS: Cardiomegaly. New onset of diffuse severe bilateral pulmonary interstitial infiltrates/edema. No prominent pleural effusion or pneumothorax. Sliding hiatal hernia again noted.  No acute bony abnormality. IMPRESSION: Cardiomegaly. New onset of diffuse severe bilateral pulmonary interstitial infiltrates/edema noted on today's exam. Electronically Signed   By: [place]   On: 05/15/2019 05:16    EKG: Independently reviewed.  Sinus rhythm at 90 bpm with no acute ST or T wave changes.  Assessment/Plan Active Problems:   Acute hypoxemic respiratory failure (HCC)    Acute hypoxemic respiratory failure secondary to acute on chronic diastolic congestive heart failure decompensation -Patient has been weaned from BiPAP to nasal cannula oxygen with less respiratory distress after some diuresis in ED with IV Lasix 80 mg -Continue IV Lasix 60 mg twice daily and monitor I's and O's and daily weights -Fluid restriction -2D echocardiogram ordered and pending  Elevated troponin -Possibly related to demand ischemia from above -Patient has had intermittent chest pain and 2D echocardiogram has been ordered and pending -Heparin drip initiated with cardiology consult appreciated -Monitor on telemetry  Mild hypokalemia -Order supplementation and follow with ongoing diuresis  Paroxysmal atrial fibrillation -Currently in sinus rhythm -Hold Eliquis for now and maintain on heparin drip for anticoagulation -Continue amiodarone  Hypertension -Monitor blood pressures carefully and hold valsartan for now with aggressive IV diuresis  CKD stage IIIa -Current creatinine of 1.01 which appears to be near baseline of 0.8-1 -Continue to follow repeat labs with ongoing diuresis  Hypothyroidism -Continue levothyroxine -Recent TSH 02/03/2019 of 3.41  Dyslipidemia -Continue statin  COPD on intermittent home oxygen -No bronchospasms currently noted -We will order breathing treatments as needed  Non-Hodgkin's B-cell lymphoma -Follows with Dr. Benay Spice, Oncology  Obesity/OSA -CPAP at night   DVT prophylaxis: Heparin drip Code Status: Full code Family Communication:  Daughter, Jenny Reichmann at  bedside Disposition Plan: Admit for treatment of heart failure with Lasix for diuresis and evaluation of ACS per cardiology Consults called: Cardiology Admission status: Inpatient, telemetry   [facility]  If 7PM-7AM, please contact night-coverage [email or web address]  05/15/2019, 2:55 PM

## 2019-05-15 NOTE — Progress Notes (Signed)
Bipap on standby at bedside. Pt placed on 3L [place]. O2 saturation 100%. Family at bedside

## 2019-05-15 NOTE — ED Notes (Signed)
Dr. Shah at bedside.

## 2019-05-15 NOTE — ED Notes (Signed)
Pt is currently on 3L [place]. Have talked to Dr. Joesph Fillers about reviewing patient's chart.

## 2019-05-15 NOTE — ED Notes (Signed)
Date and time results received: 05/15/19 0835 (use smartphrase ".now" to insert current time)  Test: Trop Critical Value: 165  Name of Provider Notified: Dr. Laverta [place]   Orders Received? Or Actions Taken?: None yet

## 2019-05-15 NOTE — Progress Notes (Signed)
2d echo completed. 

## 2019-05-15 NOTE — Consult Note (Signed)
Cardiology Consultation:   Patient ID: Amanda Sellers MRN: [id number]; DOB: 1932-07-08  Admit date: 05/15/2019 Date of Consult: 05/15/2019  Primary Care Provider: Tammi Sou, MD Primary Cardiologist: Glenetta Hew, MD  Primary Electrophysiologist:  Roderic Palau NP   Patient Profile:   Amanda Sellers is a 86 y.o. female with a hx of paroxysmal atrial fibrillation and chronic diastolic heart failure who is being seen today for the evaluation of CHF and troponin elevation at the request of Dr. Manuella Ghazi.  History of Present Illness:   Amanda Sellers is an 86 year old woman with a past medical history significant for paroxysmal atrial fibrillation, hypertension, non-Hodgkin's lymphoma, chronic diastolic heart failure who presented to the [facility] ED with acute onset of shortness of breath.  She follows with Dr. Glenetta Hew and also with Roderic Palau, NP in the atrial fibrillation clinic.  She sits with a lady 3 days/week.  Over the past week she has noted increasing bilateral leg and feet swelling.  She denies exertional chest pain but has been experiencing intermittent left arm pain over the past week both at rest and with exertion.  This morning at 3 AM she was woken by the acute onset of shortness of breath.  She called her son who called 3.  When EMS arrived she was noted to be hypoxemic and was placed on a nonrebreather.  She was given BiPAP in the ED.  Chest x-ray showed cardiomegaly and significant bilateral pulmonary edema.  BNP mildly elevated at 138.  Troponins were initially 17 and peaked at 352 and are down to 319.  She was given 80 mg of IV Lasix in the ED and is already feeling better.  She remains orthopneic.  She has had good urinary output.  She currently denies chest pain.  Her daughter was present at the time of my evaluation.  The patient told me that her non-Hodgkin's lymphoma is in remission.  She was initially diagnosed in July 2016 and underwent  4 cycles of chemotherapy with bendamustine and rituximab.  She last saw Dr. Benay Spice on 04/18/2019.  With respect to her atrial fibrillation, she was most recently evaluated in the atrial fibrillation clinic on 03/11/2019.  She has been maintained on 100 mg of amiodarone daily and has been on apixaban for anticoagulation.  She takes Lasix 60 mg twice daily.  She last saw Dr. Ellyn Hack on 11/03/2018.  Internal medicine has put her on IV heparin.  I reviewed the TEE performed in September 2016 which showed LVEF 55 to 60%.  I personally reviewed the echocardiogram performed today which demonstrates a reduction in LV systolic function, now LVEF is 35 to 40% with grade 2 diastolic dysfunction.  I personally reviewed the ECG which shows sinus rhythm with first-degree AV block and left bundle branch block which is chronic.  Past Medical History:  Diagnosis Date  . Anemia of chronic disease   . Anticoagulation adequate, Eliquis 10/16/2014  . Anxiety    clonaz helps this AND her breathing  . Chronic diastolic congestive heart failure ([place]) 89/16/9450   Complicated by atrial fibrillation; Echo 07/23/14: mild LVH, EF 50-55%, grade 2 diast dysfxn.  NYHA class II.  Marland Kitchen Chronic renal insufficiency, stage III (moderate)    GFR @ 50  . COPD (chronic obstructive pulmonary disease) ([place])    recently noted on Xray, does not have any problems  . Follicular lymphoma ([place])    Non Hodgkins B cell lymphoma; s/p chemo summer 2016--in remission still as of  09/2018: continue obs q 70mo . HLD (hyperlipidemia)     06/2014  . HTN (hypertension)   . Hypothyroidism   . IFG (impaired fasting glucose)   . Lip cancer    Dr. BJanace Hoardexcised this: invasive SCC--no sign of cancer at ENT f/u 10/2015  . Microcytic anemia    transfused 3 U total in hosp 07/2014  . Obesity (BMI 30-39.9)   . Olecranon bursitis of right elbow 02/2015  . Pancytopenia due to antineoplastic chemotherapy ([facility]   . Persistent atrial fibrillation (HCC)      With RVR; elec cardioversion 11/30/14.  On Amio since 10/2014; Dr. NLenna Gilfordfollowing her from pulm standpoint regarding this med.  .Marland KitchenPositive occult stool blood test 08/08/14   Endoscopies ok 08/2014  . Primary osteoarthritis of both knees 04/2016   Dr. GGladstone Lighter bone on bone--to get euflexxa to both knees.  . Pulmonary [place] 07/24/2014  . Shortness of breath dyspnea    with exertion.  Dr. NLenna Gilfordstarted her on clonazepam to try to relax her chest wall and allow more satisfying deep breath. (03/2016).  . Vitamin B 12 deficiency   . Vitamin D deficiency    dose increased to 50 K U tab TWICE weekly 04/2017    Past Surgical History:  Procedure Laterality Date  . ABDOMINAL HYSTERECTOMY  1978  . BONE MARROW BIOPSY  07/24/14  . BREAST BIOPSY    . CARDIOVERSION N/A 10/16/2014   Procedure: CARDIOVERSION;  Surgeon: BLelon Perla MD;  [facility]  Service: Cardiovascular;  Laterality: N/A;  . CARDIOVERSION N/A 11/30/2014   Procedure: CARDIOVERSION;  Surgeon: DLarey Dresser MD;  [place]  Service: Cardiovascular;  Laterality: N/A;  . CATARACT EXTRACTION  2020   OU: Dr. GKaty Fitch . COLONOSCOPY  09/23/14   small hemorrhoids, otherwise normal (performed for IDA and heme+ stool)  . COLONOSCOPY    . MASS EXCISION Left 07/15/2015   Left lower lip mass--invasive SCC w/negative margins.  Procedure: EXCISION MASS;  Surgeon: JMelissa Montane MD;  [place]  Service: ENT;  Laterality: Left;  Wedge excision left lower lip mass  . NM MYOVIEW LTD  10/29/2014   medium size mild surgery defect in the mid anterior and apical anterior location suggestive of breast attenuation. No reversibility. LOW RISK  . PFTs  02/2015   Restriction with diffusion defect: cardiologist referred her to pulm to help interpret PFTs and decide whether she has amiodarone toxicity  . TEE WITHOUT CARDIOVERSION N/A 10/16/2014   Procedure: TRANSESOPHAGEAL ECHOCARDIOGRAM (TEE);  Surgeon: BLelon Perla MD;   [facility];  Service: Cardiovascular;  Laterality: N/A;  . TRANSTHORACIC ECHOCARDIOGRAM  07/23/14   mild LVH, EF 50-55%, grade 2 diast dysfxn  . UPPER GI ENDOSCOPY  09/23/2014   small hiatus hernia, nodules in stomach biopsied (chronic active erosive atrophic gastritis with intestinal metaplasia--no dysplasia or malignancy) otherwise normal     Home Medications:  Prior to Admission medications   Medication Sig Start Date End Date Taking? Authorizing Provider  ALPRAZolam (XANAX) 0.5 MG tablet 1 tab po qhs prn anxiety-related insomnia Patient taking differently: Take 0.5 mg by mouth at bedtime as needed. 1 tab po qhs prn anxiety-related insomnia 07/24/18  Yes SLadell Pier MD  amiodarone (PACERONE) 200 MG tablet Take 0.5 tablets (100 mg total) by mouth daily. 03/11/19  Yes CSherran Needs NP  clonazePAM (KLONOPIN) 0.5 MG tablet TAKE 1/2 TO 1 TABLET BY MOUTH TWICE A DAY AS NEEDED  Patient taking differently: Take 0.25-0.5 mg by mouth 2 (two) times daily as needed. TAKE 1/2 TO 1 TABLET BY MOUTH TWICE A DAY AS NEEDED 12/18/18  Yes McGowen, Adrian Blackwater, MD  cyanocobalamin (,VITAMIN B-12,) 1000 MCG/ML injection INJECT 1 ML (1000 MCG TOTAL) INTO THE SKIN EVERY 30 DAYS. Patient taking differently: Inject 1,000 mcg into the skin every 30 (thirty) days. INJECT 1 ML (1000 MCG TOTAL) INTO THE SKIN EVERY 30 DAYS. 02/03/19  Yes McGowen, Adrian Blackwater, MD  ELIQUIS 5 MG TABS tablet TAKE 1 TABLET BY MOUTH TWICE A DAY Patient taking differently: Take 5 mg by mouth 2 (two) times daily.  02/17/19  Yes Leonie Man, MD  ferrous sulfate 325 (65 FE) MG tablet Take 1 tablet (325 mg total) by mouth 2 (two) times daily with a meal. 07/29/14  Yes Ladell Pier, MD  fish oil-omega-3 fatty acids 1000 MG capsule Take 2 g by mouth daily.   Yes [provider]  furosemide (LASIX) 40 MG tablet TAKE 1.5 TABLETS (60 MG TOTAL) BY MOUTH 2 (TWO) TIMES DAILY. 11/11/18  Yes Sherran Needs, NP  levothyroxine  (SYNTHROID) 100 MCG tablet TAKE 1 TABLET BY MOUTH EVERY DAY BEFORE BREAKFAST Patient taking differently: Take 100 mcg by mouth daily before breakfast.  02/19/19  Yes McGowen, Adrian Blackwater, MD  OXYGEN Inhale into the lungs. 1Lt at bedtime   Yes [provider]  potassium chloride 20 MEQ TBCR Take 20 mEq by mouth daily. 11/25/14  Yes Sherran Needs, NP  pravastatin (PRAVACHOL) 80 MG tablet TAKE 1 TABLET BY MOUTH EVERY DAY Patient taking differently: Take 80 mg by mouth daily.  11/27/18  Yes McGowen, Adrian Blackwater, MD  traMADol (ULTRAM) 50 MG tablet TAKE 1 TABLET BY MOUTH TWICE A DAY AS NEEDED FOR PAIN Patient taking differently: Take 50 mg by mouth every 12 (twelve) hours as needed.  02/17/19  Yes McGowen, Adrian Blackwater, MD  valsartan (DIOVAN) 40 MG tablet TAKE 1 TABLET BY MOUTH EVERY DAY Patient taking differently: Take 40 mg by mouth daily.  12/20/18  Yes Leonie Man, MD  Vitamin D, Ergocalciferol, (DRISDOL) 1.25 MG (50000 UT) CAPS capsule TAKE 1 CAPSULE (50,000 UNITS TOTAL) BY MOUTH 2 (TWO) TIMES A WEEK. 10/10/18  Yes McGowen, Adrian Blackwater, MD    Inpatient Medications: Scheduled Meds: . amiodarone  100 mg Oral Daily  . ferrous sulfate  325 mg Oral BID WC  . furosemide  60 mg Intravenous Q12H  . [START ON 05/16/2019] levothyroxine  100 mcg Oral QAC breakfast  . potassium chloride  40 mEq Oral BID  . pravastatin  80 mg Oral Daily  . sodium chloride flush  3 mL Intravenous Q12H   Continuous Infusions: . sodium chloride    . heparin 1,000 Units/hr (05/15/19 1301)   PRN Meds: sodium chloride, acetaminophen **OR** acetaminophen, ALPRAZolam, ipratropium-albuterol, ondansetron **OR** ondansetron (ZOFRAN) IV, sodium chloride flush, traMADol  Allergies:    Allergies  Allergen Reactions  . Iodinated Diagnostic Agents Rash and Other (See Comments)    CT Contrast    Social History:   Social History   Socioeconomic History  . Marital status: Married    Spouse name: Not on file  . Number of  children: 8  . Years of education: Not on file  . Highest education level: Not on file  Occupational History  . Occupation: retired  Tobacco Use  . Smoking status: Never Smoker  . Smokeless tobacco: Never Used  Substance and Sexual Activity  .  Alcohol use: No    Alcohol/week: 0.0 standard drinks  . Drug use: No  . Sexual activity: Not on file  Other Topics Concern  . Not on file  Social History Narrative   Widowed, has 8 children.   Orig from [place], now lives in [place].   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   No tob/alc/drugs.   Social Determinants of Health   Financial Resource Strain:   . Difficulty of Paying Living Expenses:   Food Insecurity:   . Worried About Charity fundraiser in the Last Year:   . Arboriculturist in the Last Year:   Transportation Needs:   . Film/video editor (Medical):   Marland Kitchen Lack of Transportation (Non-Medical):   Physical Activity:   . Days of Exercise per Week:   . Minutes of Exercise per Session:   Stress:   . Feeling of Stress :   Social Connections:   . Frequency of Communication with Friends and Family:   . Frequency of Social Gatherings with Friends and Family:   . Attends Religious Services:   . Active Member of Clubs or Organizations:   . Attends Archivist Meetings:   Marland Kitchen Marital Status:   Intimate Partner Violence:   . Fear of Current or Ex-Partner:   . Emotionally Abused:   Marland Kitchen Physically Abused:   . Sexually Abused:     Family History:    Family History  Problem Relation Age of Onset  . Melanoma Mother   . Stomach cancer Father   . Lymphoma Sister   . Diabetes Daughter      ROS:  Please see the history of present illness.   All other ROS reviewed and negative.     Barbarann Ehlers, RN was present throughout the entirety of the encounter.  Physical Exam/Data:   Vitals:   05/15/19 1300 05/15/19 1400 05/15/19 1430 05/15/19 1500  BP: 125/69 116/66 122/70  121/69  Pulse: 78 76 78 79  Resp: (!) 21 (!) 23 (!) 25 20  SpO2: 98% 97% 96% 96%  Weight:      Height:        Intake/Output Summary (Last 24 hours) at 05/15/2019 1531 Last data filed at 05/15/2019 0829 Gross per 24 hour  Intake --  Output 700 ml  Net -700 ml   Last 3 Weights 05/15/2019 04/18/2019 03/11/2019  Weight (lbs) 210 lb 8.6 oz 213 lb 4.8 oz 206 lb 9.6 oz  Weight (kg) 95.5 kg 96.752 kg 93.713 kg     Body mass index is 33.98 kg/m.  General:  Well nourished, well developed, in no acute distress. Sitting at 45 degree angle. HEENT: normal Lymph: no adenopathy Neck: +JVD Endocrine:  No thryomegaly Cardiac:  normal S1, S2; RRR; no murmur  Lungs: Diminished air entry with faint bilateral crackles Abd: soft, nontender, no hepatomegaly  Ext: 1-2+ pitting bilateral lower extremity edema Musculoskeletal:  No deformities, BUE and BLE strength normal and equal Skin: warm and dry  Neuro:  CNs 2-12 intact, no focal abnormalities noted Psych:  Normal affect   EKG:  The EKG was personally reviewed and demonstrates: Sinus rhythm with first-degree AV block and left bundle branch block Telemetry:  Telemetry was personally reviewed and demonstrates: Sinus rhythm  Relevant CV Studies:  Echocardiogram 05/15/2019:  1. Left ventricular ejection fraction, by estimation, is 35 to 40%. The  left ventricle has moderately decreased function. The left ventricle  demonstrates global hypokinesis. There is mild concentric left ventricular  hypertrophy. Left ventricular  diastolic parameters are consistent with Grade II diastolic dysfunction  (pseudonormalization). Elevated left ventricular end-diastolic pressure.  2. Right ventricular systolic function is low normal. The right  ventricular size is normal.  3. Left atrial size was mildly dilated.  4. The mitral valve is degenerative. Mild mitral valve regurgitation.  5. The aortic valve is tricuspid. Aortic valve regurgitation is trivial.  No  aortic stenosis is present.   Laboratory Data:  High Sensitivity Troponin:   Recent Labs  Lab 05/15/19 0450 05/15/19 0755 05/15/19 1148 05/15/19 1256  TROPONINIHS 17 165* 352* 319*     Chemistry Recent Labs  Lab 05/15/19 0450  NA 141  K 3.4*  CL 99  CO2 30  GLUCOSE 175*  BUN 21  CREATININE 1.01*  CALCIUM 8.7*  GFRNONAA 51*  GFRAA 59*  ANIONGAP 12    Recent Labs  Lab 05/15/19 0450  PROT 7.0  ALBUMIN 4.3  AST 53*  ALT 49*  ALKPHOS 52  BILITOT 0.8   Hematology Recent Labs  Lab 05/15/19 0450  WBC 5.2  RBC 4.73  HGB 13.7  HCT 44.9  MCV 94.9  MCH 29.0  MCHC 30.5  RDW 13.6  PLT 141*   BNP Recent Labs  Lab 05/15/19 0450  BNP 138.0*    DDimer No results for input(s): DDIMER in the last 168 hours.   Radiology/Studies:  DG Chest Port 1 View  Result Date: 05/15/2019 CLINICAL DATA:  Shortness of breath. EXAM: PORTABLE CHEST 1 VIEW COMPARISON:  01/26/2017. FINDINGS: Cardiomegaly. New onset of diffuse severe bilateral pulmonary interstitial infiltrates/edema. No prominent pleural effusion or pneumothorax. Sliding hiatal hernia again noted. No acute bony abnormality. IMPRESSION: Cardiomegaly. New onset of diffuse severe bilateral pulmonary interstitial infiltrates/edema noted on today's exam. Electronically Signed   By: Marcello Moores  Register   On: 05/15/2019 05:16   ECHOCARDIOGRAM COMPLETE  Result Date: 05/15/2019    ECHOCARDIOGRAM REPORT   Patient Name:   Amanda Sellers Date of Exam: 05/15/2019 Medical Rec #:  [id number]         Height:       66.0 in Accession #:    [id number]        Weight:       210.5 lb Date of Birth:  [birth year]          BSA:          2.044 m Patient Age:    32 years          BP:           122/70 mmHg Patient Gender: F                 HR:           78 bpm. Exam Location:  [facility] Procedure: 2D Echo Indications:    Elevated Troponin  History:        Patient has prior history of Echocardiogram examinations, most                 recent 10/16/2014.  CHF, Arrythmias:Atrial Fibrillation,                 Signs/Symptoms:Dyspnea; Risk Factors:Hypertension, Dyslipidemia                 and Non-Smoker.  Sonographer:    Leavy Cella RDCS (AE) Referring Phys: [phone] Royanne Foots [place]  1. Left ventricular ejection fraction, by estimation, is 35  to 40%. The left ventricle has moderately decreased function. The left ventricle demonstrates global hypokinesis. There is mild concentric left ventricular hypertrophy. Left ventricular diastolic parameters are consistent with Grade II diastolic dysfunction (pseudonormalization). Elevated left ventricular end-diastolic pressure.  2. Right ventricular systolic function is low normal. The right ventricular size is normal.  3. Left atrial size was mildly dilated.  4. The mitral valve is degenerative. Mild mitral valve regurgitation.  5. The aortic valve is tricuspid. Aortic valve regurgitation is trivial. No aortic stenosis is present. FINDINGS  Left Ventricle: Left ventricular ejection fraction, by estimation, is 35 to 40%. The left ventricle has moderately decreased function. The left ventricle demonstrates global hypokinesis. The left ventricular internal cavity size was normal in size. There is mild concentric left ventricular hypertrophy. Left ventricular diastolic parameters are consistent with Grade II diastolic dysfunction (pseudonormalization). Elevated left ventricular end-diastolic pressure. Right Ventricle: The right ventricular size is normal. No increase in right ventricular wall thickness. Right ventricular systolic function is low normal. Left Atrium: Left atrial size was mildly dilated. Right Atrium: Right atrial size was normal in size. Pericardium: There is no evidence of pericardial effusion. Mitral Valve: The mitral valve is degenerative in appearance. There is mild thickening of the mitral valve leaflet(s). Mild to moderate mitral annular calcification. Mild mitral valve regurgitation. Tricuspid  Valve: The tricuspid valve is grossly normal. Tricuspid valve regurgitation is mild. Aortic Valve: The aortic valve is tricuspid. Aortic valve regurgitation is trivial. Aortic regurgitation PHT measures 382 msec. No aortic stenosis is present. Mild aortic valve annular calcification. Pulmonic Valve: The pulmonic valve was grossly normal. Pulmonic valve regurgitation is mild. Aorta: The aortic root is normal in size and structure. Venous: The inferior vena cava was not well visualized. IAS/Shunts: The interatrial septum was not well visualized.  LEFT VENTRICLE PLAX 2D LVIDd:         4.32 cm  Diastology LVIDs:         3.41 cm  LV e' lateral:   4.87 cm/s LV PW:         1.19 cm  LV E/e' lateral: 24.0 LV IVS:        1.28 cm  LV e' medial:    2.92 cm/s LVOT diam:     1.90 cm  LV E/e' medial:  40.1 LVOT Area:     2.84 cm  RIGHT VENTRICLE RV S prime:     11.30 cm/s TAPSE (M-mode): 1.8 cm LEFT ATRIUM             Index LA diam:        3.20 cm 1.57 cm/m LA Vol (A2C):   90.2 ml 44.13 ml/m LA Vol (A4C):   59.9 ml 29.31 ml/m LA Biplane Vol: 73.7 ml 36.06 ml/m  AORTIC VALVE AI PHT:      382 msec  AORTA Ao Root diam: 3.10 cm MITRAL VALVE MV Area (PHT): 3.43 cm     SHUNTS MV Decel Time: 221 msec     Systemic Diam: 1.90 cm MR Peak grad: 73.6 mmHg MR Mean grad: 43.0 mmHg MR Vmax:      429.00 cm/s MR Vmean:     295.0 cm/s MV E velocity: 117.00 cm/s MV A velocity: 101.00 cm/s MV E/A ratio:  1.16 Kate Sable MD Electronically signed by Kate Sable MD Signature Date/Time: 05/15/2019/3:10:20 PM    Final          Assessment and Plan:   1.  Acute combined systolic and diastolic heart failure: LV  systolic function had been normal in September 2016 and is now moderately reduced, LVEF 35 to 40% with grade 2 diastolic dysfunction.  She has received IV Lasix 80 mg and is scheduled to start 60 mg IV twice daily.  For the time being I would avoid beta-blockers given acute heart failure and so as not to potentiate worsening  heart failure.  She can continue on amiodarone for paroxysmal atrial fibrillation.  Once she is no longer orthopneic I would recommend transferring to [facility] for right and left heart catheterization and coronary angiography, possibly on 05/19/2019.  2.  Troponin elevation:  Troponins were initially 17 and peaked at 352 and are down to 319. While there likely indicative of demand ischemia in the context of decompensated heart failure, obstructive coronary artery disease needs to be ruled out given marked reduction in LV systolic function.  She denies exertional chest pain but has had intermittent left arm pain over the past week and I wonder if this is her anginal equivalent.  Once she is no longer orthopneic I would recommend transferring to [facility] for right and left heart catheterization and coronary angiography, possibly on 05/19/2019.  3.  Paroxysmal atrial fibrillation: Currently in sinus rhythm.  Internal medicine has held Eliquis and started IV heparin.  Continue amiodarone 100 mg daily.  4.  Hypertension: Most recent blood pressure is normal.  Valsartan held by internal medicine given need for IV diuresis in context of CKD stage III.  5.  Chronic kidney disease stage III: Creatinine 1.01.  This will need daily monitoring given need for IV diuresis and upcoming cardiac catheterization.  6.  Hyperlipidemia: Continue pravastatin.  7.  Non-Hodgkin's lymphoma: This is in remission.  She was initially diagnosed in July 2016 and underwent 4 cycles of chemotherapy with bendamustine and rituximab.  She last saw Dr. Benay Spice on 04/18/2019.  8.  Obstructive sleep apnea: On CPAP.    For questions or updates, please contact [place] Please consult [email or web address] for contact info under     Signed, Kate Sable, MD  05/15/2019 3:31 PM

## 2019-05-15 NOTE — ED Notes (Signed)
Date and time results received: 05/15/19 1220  (use smartphrase ".now" to insert current time)  Test: Trop  Critical Value: 352  Name of Provider Notified: Dr. Manuella Ghazi   Orders Received? Or Actions Taken?: None Yet

## 2019-05-15 NOTE — Progress Notes (Signed)
Was told patient had a CPAP order.  There is only a Bipap order for continuous at this time.  Modified Bipap order for PRN.  Patient has been off [place] since earlier this morning and is currently on 2L [place] and is tolerating well.  Patient sat is between 93 to 95% and HR is currently at 71.  Informed patient that if she started having any issues with her breathing to have her RN call.  Will continue to monitor patient.

## 2019-05-15 NOTE — ED Triage Notes (Signed)
Pt presents from home via [facility] with C/O SOB. Pt was 70% on RA. Placed on CPAP en route and pt o2 100%.

## 2019-05-15 NOTE — ED Provider Notes (Signed)
[facility] EMERGENCY DEPARTMENT Provider Note   CSN: [id number] Arrival date & time: 05/15/19  0445   Time seen 4:45 AM (on arrival)  History Chief Complaint  Patient presents with  . Respiratory Distress   Level 5 caveat for respiratory distress  Amanda Sellers is a 86 y.o. female.  HPI   Per EMS patient had acute onset of shortness of breath at 4 AM.  Her initial pulse ox was 70% on room air.  Firefighters had put her on a nonrebreather mask and her pulse ox was still 85%.  EMS put her on CPAP and her oxygen improved to 100%.  They state her initial blood pressure was 160/100 and just prior to getting to the ED it was 102/58.  EMS said they did not give her any medications.  She did have oxygen at home that she only uses as needed.  Patient shakes her head that her breathing is better but she still appears to be in distress.  She denies chest pain.  She indicates her legs have been swelling recently.  Please note patient presented to the ED with a nonrebreather mask because EMS states they were not allowed to bring the patient in the ED on their CPAP.  PCP McGowen, Adrian Blackwater, MD   Past Medical History:  Diagnosis Date  . Anemia of chronic disease   . Anticoagulation adequate, Eliquis 10/16/2014  . Anxiety    clonaz helps this AND her breathing  . Chronic diastolic congestive heart failure ([place]) 0000000   Complicated by atrial fibrillation; Echo 07/23/14: mild LVH, EF 50-55%, grade 2 diast dysfxn.  NYHA class II.  Marland Kitchen Chronic renal insufficiency, stage III (moderate)    GFR @ 50  . COPD (chronic obstructive pulmonary disease) ([place])    recently noted on Xray, does not have any problems  . Follicular lymphoma ([place])    Non Hodgkins B cell lymphoma; s/p chemo summer 2016--in remission still as of 09/2018: continue obs q 59mo  . HLD (hyperlipidemia)     06/2014  . HTN (hypertension)   . Hypothyroidism   . IFG (impaired fasting glucose)   . Lip cancer    Dr. Janace Hoard excised this:  invasive SCC--no sign of cancer at ENT f/u 10/2015  . Microcytic anemia    transfused 3 U total in hosp 07/2014  . Obesity (BMI 30-39.9)   . Olecranon bursitis of right elbow 02/2015  . Pancytopenia due to antineoplastic chemotherapy ([place])   . Persistent atrial fibrillation (HCC)    With RVR; elec cardioversion 11/30/14.  On Amio since 10/2014; Dr. Lenna Gilford following her from pulm standpoint regarding this med.  Marland Kitchen Positive occult stool blood test 08/08/14   Endoscopies ok 08/2014  . Primary osteoarthritis of both knees 04/2016   Dr. Gladstone Lighter: bone on bone--to get euflexxa to both knees.  . Pulmonary metastases ([place]) 07/24/2014  . Shortness of breath dyspnea    with exertion.  Dr. Lenna Gilford started her on clonazepam to try to relax her chest wall and allow more satisfying deep breath. (03/2016).  . Vitamin B 12 deficiency   . Vitamin D deficiency    dose increased to 50 K U tab TWICE weekly 04/2017    Patient Active Problem List   Diagnosis Date Noted  . Stomatitis 08/14/2016  . Osteoarthritis 05/08/2016  . Pernicious anemia 06/29/2015  . Abnormal finding on pulmonary function testing 05/10/2015  . History of pernicious anemia 05/10/2015  . Other iron deficiency anemias 05/10/2015  . On  amiodarone therapy 02/11/2015  . Anticoagulation adequate, Eliquis 10/16/2014  . Paroxysmal atrial fibrillation with RVR ([place]): CHA2DS2-VASc Score 4 10/15/2014  . Microcytic anemia 08/09/2014  . Positive occult stool blood test 08/08/2014  . Symptomatic anemia 08/07/2014  . Pancytopenia due to antineoplastic chemotherapy ([place]) 08/07/2014  . Follicular lymphoma ([place]) 07/29/2014  . Lymphadenopathy   . Anemia 07/22/2014  . Dyspnea 07/22/2014  . Chronic diastolic CHF (congestive heart failure), NYHA class 2 ([place]) 07/22/2014  . Essential hypertension 07/22/2014  . Hyperlipidemia 07/22/2014  . Osteoporosis 03/01/2006    Past Surgical History:  Procedure Laterality Date  . ABDOMINAL HYSTERECTOMY  1978  . BONE  MARROW BIOPSY  07/24/14  . BREAST BIOPSY    . CARDIOVERSION N/A 10/16/2014   Procedure: CARDIOVERSION;  Surgeon: Lelon Perla, MD;  Location: [facility] ENDOSCOPY;  Service: Cardiovascular;  Laterality: N/A;  . CARDIOVERSION N/A 11/30/2014   Procedure: CARDIOVERSION;  Surgeon: Larey Dresser, MD;  Location: [place];  Service: Cardiovascular;  Laterality: N/A;  . CATARACT EXTRACTION  2020   OU: Dr. Katy Fitch  . COLONOSCOPY  09/23/14   small hemorrhoids, otherwise normal (performed for IDA and heme+ stool)  . COLONOSCOPY    . MASS EXCISION Left 07/15/2015   Left lower lip mass--invasive SCC w/negative margins.  Procedure: EXCISION MASS;  Surgeon: Melissa Montane, MD;  Location: [place];  Service: ENT;  Laterality: Left;  Wedge excision left lower lip mass  . NM MYOVIEW LTD  10/29/2014   medium size mild surgery defect in the mid anterior and apical anterior location suggestive of breast attenuation. No reversibility. LOW RISK  . PFTs  02/2015   Restriction with diffusion defect: cardiologist referred her to pulm to help interpret PFTs and decide whether she has amiodarone toxicity  . TEE WITHOUT CARDIOVERSION N/A 10/16/2014   Procedure: TRANSESOPHAGEAL ECHOCARDIOGRAM (TEE);  Surgeon: Lelon Perla, MD;  Location: [facility] ENDOSCOPY;  Service: Cardiovascular;  Laterality: N/A;  . TRANSTHORACIC ECHOCARDIOGRAM  07/23/14   mild LVH, EF 50-55%, grade 2 diast dysfxn  . UPPER GI ENDOSCOPY  09/23/2014   small hiatus hernia, nodules in stomach biopsied (chronic active erosive atrophic gastritis with intestinal metaplasia--no dysplasia or malignancy) otherwise normal     OB History   No obstetric history on file.     Family History  Problem Relation Age of Onset  . Melanoma Mother   . Stomach cancer Father   . Lymphoma Sister   . Diabetes Daughter     Social History   Tobacco Use  . Smoking status: Never Smoker  . Smokeless tobacco: Never Used  Substance Use Topics  . Alcohol use: No    Alcohol/week: 0.0  standard drinks  . Drug use: No  lives at home Home oxygen PRN   Home Medications Prior to Admission medications   Medication Sig Start Date End Date Taking? Authorizing Provider  ALPRAZolam Duanne Moron) 0.5 MG tablet 1 tab po qhs prn anxiety-related insomnia 07/24/18   Ladell Pier, MD  amiodarone (PACERONE) 200 MG tablet Take 0.5 tablets (100 mg total) by mouth daily. 03/11/19   Sherran Needs, NP  clonazePAM (KLONOPIN) 0.5 MG tablet TAKE 1/2 TO 1 TABLET BY MOUTH TWICE A DAY AS NEEDED 12/18/18   McGowen, Adrian Blackwater, MD  cyanocobalamin (,VITAMIN B-12,) 1000 MCG/ML injection INJECT 1 ML (1000 MCG TOTAL) INTO THE SKIN EVERY 30 DAYS. 02/03/19   McGowen, Adrian Blackwater, MD  ELIQUIS 5 MG TABS tablet TAKE 1 TABLET BY MOUTH TWICE A DAY 02/17/19  Leonie Man, MD  ferrous sulfate 325 (65 FE) MG tablet Take 1 tablet (325 mg total) by mouth 2 (two) times daily with a meal. 07/29/14   Ladell Pier, MD  fish oil-omega-3 fatty acids 1000 MG capsule Take 2 g by mouth daily.    [provider]  furosemide (LASIX) 40 MG tablet TAKE 1.5 TABLETS (60 MG TOTAL) BY MOUTH 2 (TWO) TIMES DAILY. 11/11/18   Sherran Needs, NP  levothyroxine (SYNTHROID) 100 MCG tablet TAKE 1 TABLET BY MOUTH EVERY DAY BEFORE BREAKFAST 02/19/19   McGowen, Adrian Blackwater, MD  OXYGEN Inhale into the lungs. 1Lt at bedtime    [provider]  potassium chloride 20 MEQ TBCR Take 20 mEq by mouth daily. 11/25/14   Sherran Needs, NP  pravastatin (PRAVACHOL) 80 MG tablet TAKE 1 TABLET BY MOUTH EVERY DAY 11/27/18   McGowen, Adrian Blackwater, MD  traMADol (ULTRAM) 50 MG tablet TAKE 1 TABLET BY MOUTH TWICE A DAY AS NEEDED FOR PAIN 02/17/19   Tammi Sou, MD  valsartan (DIOVAN) 40 MG tablet TAKE 1 TABLET BY MOUTH EVERY DAY 12/20/18   Leonie Man, MD  Vitamin D, Ergocalciferol, (DRISDOL) 1.25 MG (50000 UT) CAPS capsule TAKE 1 CAPSULE (50,000 UNITS TOTAL) BY MOUTH 2 (TWO) TIMES A WEEK. 10/10/18   McGowen, Adrian Blackwater, MD    Allergies      Iodinated diagnostic agents  Review of Systems   Review of Systems  Unable to perform ROS: Severe respiratory distress    Physical Exam Updated Vital Signs BP (!) 146/86   Pulse 81   Resp (!) 24   Ht 5\' 6"  (1.676 m)   Wt 95.5 kg   SpO2 100%   BMI 33.98 kg/m   Physical Exam Vitals and nursing note reviewed.  Constitutional:      General: She is in acute distress.     Appearance: She is obese.  HENT:     Head: Normocephalic and atraumatic.     Right Ear: External ear normal.     Left Ear: External ear normal.  Eyes:     Extraocular Movements: Extraocular movements intact.     Conjunctiva/sclera: Conjunctivae normal.     Pupils: Pupils are equal, round, and reactive to light.  Cardiovascular:     Rate and Rhythm: Normal rate and regular rhythm.     Heart sounds: No murmur.  Pulmonary:     Effort: Tachypnea, accessory muscle usage, prolonged expiration and respiratory distress present.     Breath sounds: Decreased air movement present. Wheezing and rales present.  Abdominal:     General: Bowel sounds are normal. There is distension.     Tenderness: There is no abdominal tenderness.  Musculoskeletal:     Cervical back: Normal range of motion.     Comments: Her legs appear swollen but there is no pitting noted.  Skin:    General: Skin is warm and dry.     Findings: No erythema.  Neurological:     Mental Status: She is alert.  Psychiatric:        Mood and Affect: Mood is anxious.        Behavior: Behavior is cooperative.     Comments: Patient does not verbally respond to questions but she shakes her head     ED Results / Procedures / Treatments   Labs (all labs ordered are listed, but only abnormal results are displayed) Results for orders placed or performed during the hospital encounter  of 05/15/19  Respiratory Panel by RT PCR (Flu A&B, Covid) - Nasopharyngeal Swab   Specimen: Nasopharyngeal Swab  Result Value Ref Range   SARS Coronavirus 2 by RT PCR  NEGATIVE NEGATIVE   Influenza A by PCR NEGATIVE NEGATIVE   Influenza B by PCR NEGATIVE NEGATIVE  Comprehensive metabolic panel  Result Value Ref Range   Sodium 141 135 - 145 mmol/L   Potassium 3.4 (L) 3.5 - 5.1 mmol/L   Chloride 99 98 - 111 mmol/L   CO2 30 22 - 32 mmol/L   Glucose, Bld 175 (H) 70 - 99 mg/dL   BUN 21 8 - 23 mg/dL   Creatinine, Ser 1.01 (H) 0.44 - 1.00 mg/dL   Calcium 8.7 (L) 8.9 - 10.3 mg/dL   Total Protein 7.0 6.5 - 8.1 g/dL   Albumin 4.3 3.5 - 5.0 g/dL   AST 53 (H) 15 - 41 U/L   ALT 49 (H) 0 - 44 U/L   Alkaline Phosphatase 52 38 - 126 U/L   Total Bilirubin 0.8 0.3 - 1.2 mg/dL   GFR calc non Af Amer 51 (L) >60 mL/min   GFR calc Af Amer 59 (L) >60 mL/min   Anion gap 12 5 - 15  Brain natriuretic peptide  Result Value Ref Range   B Natriuretic Peptide 138.0 (H) 0.0 - 100.0 pg/mL  CBC with Differential  Result Value Ref Range   WBC 5.2 4.0 - 10.5 K/uL   RBC 4.73 3.87 - 5.11 MIL/uL   Hemoglobin 13.7 12.0 - 15.0 g/dL   HCT 44.9 36.0 - 46.0 %   MCV 94.9 80.0 - 100.0 fL   MCH 29.0 26.0 - 34.0 pg   MCHC 30.5 30.0 - 36.0 g/dL   RDW 13.6 11.5 - 15.5 %   Platelets 141 (L) 150 - 400 K/uL   nRBC 0.0 0.0 - 0.2 %   Neutrophils Relative % 48 %   Neutro Abs 2.5 1.7 - 7.7 K/uL   Lymphocytes Relative 40 %   Lymphs Abs 2.1 0.7 - 4.0 K/uL   Monocytes Relative 9 %   Monocytes Absolute 0.5 0.1 - 1.0 K/uL   Eosinophils Relative 3 %   Eosinophils Absolute 0.1 0.0 - 0.5 K/uL   Basophils Relative 0 %   Basophils Absolute 0.0 0.0 - 0.1 K/uL   Immature Granulocytes 0 %   Abs Immature Granulocytes 0.02 0.00 - 0.07 K/uL  Magnesium  Result Value Ref Range   Magnesium 2.6 (H) 1.7 - 2.4 mg/dL  Blood gas, arterial  Result Value Ref Range   FIO2 100.00    pH, Arterial 7.381 7.350 - 7.450   pCO2 arterial 52.3 (H) 32.0 - 48.0 mmHg   pO2, Arterial 159 (H) 83.0 - 108.0 mmHg   Bicarbonate 28.5 (H) 20.0 - 28.0 mmol/L   Acid-Base Excess 5.4 (H) 0.0 - 2.0 mmol/L   O2 Saturation 98.9 %    Patient temperature 37.0    Allens test (pass/fail) PASS PASS  Troponin I (High Sensitivity)  Result Value Ref Range   Troponin I (High Sensitivity) 17 <18 ng/L    Laboratory interpretation all normal except mild hypokalemia, high magnesium, minimally elevated BNP   EKG EKG Interpretation  Date/Time:  Thursday May 15 2019 04:57:44 EDT Ventricular Rate:  90 PR Interval:    QRS Duration: 153 QT Interval:  418 QTC Calculation: 512 R Axis:   86 Text Interpretation: Sinus rhythm Borderline prolonged PR interval LVH with secondary repolarization abnormality Anterior infarct, old Prolonged  QT interval No significant change since last tracing 11 Mar 2019 Confirmed by Rolland Porter [id number]) on 05/15/2019 5:00:55 AM   Radiology DG Chest Port 1 View  Result Date: 05/15/2019 CLINICAL DATA:  Shortness of breath. EXAM: PORTABLE CHEST 1 VIEW COMPARISON:  01/26/2017. FINDINGS: Cardiomegaly. New onset of diffuse severe bilateral pulmonary interstitial infiltrates/edema. No prominent pleural effusion or pneumothorax. Sliding hiatal hernia again noted. No acute bony abnormality. IMPRESSION: Cardiomegaly. New onset of diffuse severe bilateral pulmonary interstitial infiltrates/edema noted on today's exam. Electronically Signed   By: [place]   On: 05/15/2019 05:16    Procedures .Critical Care Performed by: Rolland Porter, MD Authorized by: Rolland Porter, MD   Critical care provider statement:    Critical care time (minutes):  38   Critical care was necessary to treat or prevent imminent or life-threatening deterioration of the following conditions:  Cardiac failure and respiratory failure   Critical care was time spent personally by me on the following activities:  Discussions with consultants, examination of patient, obtaining history from patient or surrogate, ordering and review of laboratory studies, ordering and review of radiographic studies, pulse oximetry, re-evaluation of patient's  condition and review of old charts   (including critical care time)  Medications Ordered in ED Medications  albuterol (VENTOLIN HFA) 108 (90 Base) MCG/ACT inhaler 8 puff ( Inhalation Canceled Entry 05/15/19 0516)  furosemide (LASIX) injection 80 mg (80 mg Intravenous Given 05/15/19 0459)  albuterol (VENTOLIN HFA) 108 (90 Base) MCG/ACT inhaler (  Given 05/15/19 0450)  nitroGLYCERIN (NITROGLYN) 2 % ointment 1 inch (1 inch Topical Given 05/15/19 0459)    ED Course  I have reviewed the triage vital signs and the nursing notes.  Pertinent labs & imaging results that were available during my care of the patient were reviewed by me and considered in my medical decision making (see chart for details).    MDM Rules/Calculators/A&P                     Respiratory will not do a nebulizer until her Covid test has resulted.  She was given 8 puffs of albuterol inhaler and then placed on BiPAP.  She was given Lasix IV.  Portable chest x-ray and laboratory testing was ordered.  Her initial blood pressure is 176/109, nitroglycerin paste was ordered.  Review of her labs shows on January 11 her BUN was 22 and creatinine was 0.98 and on February 16 her BUN was 15 and creatinine was 1.15.  Because of probable renal insufficiency although mild I increased her Lasix from 60 mg IV to 80 mg IV.  Recheck at 5:45 AM patient appears much more comfortable.  She continues on the BiPAP.  When I listen to her now her lungs are clear, there is no more wheezing or rales.  She is trying to talk to me a lot now and she states she is feeling a lot better.  I think at this point we can try putting her on a nonrebreather mask and see how she does.  6:12 AM Dr. Scherrie November, hospitalist, will admit, wants a ABG to be done.   Recheck at 6:15 AM patient has been taken off her BiPAP and is on a nonrebreather.  She states she feels like her breathing is not worse since she was taken off the BiPAP.  She states she is feeling better.  She is  starting to have urinary output after the Lasix.  Final Clinical Impression(s) / ED Diagnoses Final diagnoses:  Acute on chronic congestive heart failure, unspecified heart failure type ([place])  COPD exacerbation ([place])  Hypoxia    Rx / DC Orders  Plan admission  Rolland Porter, MD, Barbette Or, MD 05/15/19 [phone]

## 2019-05-15 NOTE — Progress Notes (Signed)
ANTICOAGULATION CONSULT NOTE - Initial Consult  Pharmacy Consult for heparin Indication: chest pain/ACS  Allergies  Allergen Reactions  . Iodinated Diagnostic Agents Rash and Other (See Comments)    CT Contrast    Patient Measurements: Height: 5\' 6"  (167.6 cm) Weight: 95.5 kg (210 lb 8.6 oz) IBW/kg (Calculated) : 59.3 Heparin Dosing Weight: 80.5 kg  Vital Signs: BP: 110/57 (04/22 1230) Pulse Rate: 74 (04/22 1230)  Labs: Recent Labs    05/15/19 0450 05/15/19 0755 05/15/19 1148  HGB 13.7  --   --   HCT 44.9  --   --   PLT 141*  --   --   CREATININE 1.01*  --   --   TROPONINIHS 17 165* 352*    Estimated Creatinine Clearance: 48.3 mL/min (A) (by C-G formula based on SCr of 1.01 mg/dL (H)).   Medical History: Past Medical History:  Diagnosis Date  . Anemia of chronic disease   . Anticoagulation adequate, Eliquis 10/16/2014  . Anxiety    clonaz helps this AND her breathing  . Chronic diastolic congestive heart failure ([place]) 0000000   Complicated by atrial fibrillation; Echo 07/23/14: mild LVH, EF 50-55%, grade 2 diast dysfxn.  NYHA class II.  Marland Kitchen Chronic renal insufficiency, stage III (moderate)    GFR @ 50  . COPD (chronic obstructive pulmonary disease) ([place])    recently noted on Xray, does not have any problems  . Follicular lymphoma ([place])    Non Hodgkins B cell lymphoma; s/p chemo summer 2016--in remission still as of 09/2018: continue obs q 51mo  . HLD (hyperlipidemia)     06/2014  . HTN (hypertension)   . Hypothyroidism   . IFG (impaired fasting glucose)   . Lip cancer    Dr. Janace Hoard excised this: invasive SCC--no sign of cancer at ENT f/u 10/2015  . Microcytic anemia    transfused 3 U total in hosp 07/2014  . Obesity (BMI 30-39.9)   . Olecranon bursitis of right elbow 02/2015  . Pancytopenia due to antineoplastic chemotherapy ([place])   . Persistent atrial fibrillation (HCC)    With RVR; elec cardioversion 11/30/14.  On Amio since 10/2014; Dr. Lenna Gilford following her  from pulm standpoint regarding this med.  Marland Kitchen Positive occult stool blood test 08/08/14   Endoscopies ok 08/2014  . Primary osteoarthritis of both knees 04/2016   Dr. Gladstone Lighter: bone on bone--to get euflexxa to both knees.  . Pulmonary metastases ([place]) 07/24/2014  . Shortness of breath dyspnea    with exertion.  Dr. Lenna Gilford started her on clonazepam to try to relax her chest wall and allow more satisfying deep breath. (03/2016).  . Vitamin B 12 deficiency   . Vitamin D deficiency    dose increased to 50 K U tab TWICE weekly 04/2017    Medications:  (Not in a hospital admission)   Assessment: Pharmacy consulted to dose heparin in patient with ACS/STEMI.  Patient on Eliquis prior to admission with last dose given 4/21 0500.  Will need to monitor based on aPTT until correlation with heparin levels.  Trop high sens: 352  Goal of Therapy:  Heparin level 0.3-0.7 units/ml aPTT 66-102 seconds Monitor platelets by anticoagulation protocol: Yes   Plan:  Give 4000 units bolus x 1 Start heparin infusion at 1000 units/hr Check anti-Xa level in 8 hours and daily while on heparin Continue to monitor H&H and platelets  Margot Ables, PharmD Clinical Pharmacist 05/15/2019 12:38 PM

## 2019-05-16 ENCOUNTER — Telehealth: Payer: Self-pay

## 2019-05-16 ENCOUNTER — Other Ambulatory Visit: Payer: Self-pay | Admitting: Family Medicine

## 2019-05-16 LAB — BASIC METABOLIC PANEL
Anion gap: 12 (ref 5–15)
BUN: 16 mg/dL (ref 8–23)
CO2: 28 mmol/L (ref 22–32)
Calcium: 8.8 mg/dL — ABNORMAL LOW (ref 8.9–10.3)
Chloride: 101 mmol/L (ref 98–111)
Creatinine, Ser: 0.87 mg/dL (ref 0.44–1.00)
GFR calc Af Amer: >60 mL/min (ref >60)
GFR calc non Af Amer: >60 mL/min (ref >60)
Glucose, Bld: 131 mg/dL — ABNORMAL HIGH (ref 70–99)
Potassium: 3.9 mmol/L (ref 3.5–5.1)
Sodium: 141 mmol/L (ref 135–145)

## 2019-05-16 LAB — CBC
HCT: 39.8 % (ref 36.0–46.0)
Hemoglobin: 12.2 g/dL (ref 12.0–15.0)
MCH: 28.8 pg (ref 26.0–34.0)
MCHC: 30.7 g/dL (ref 30.0–36.0)
MCV: 94.1 fL (ref 80.0–100.0)
Platelets: 133 10*3/uL — ABNORMAL LOW (ref 150–400)
RBC: 4.23 MIL/uL (ref 3.87–5.11)
RDW: 13.8 % (ref 11.5–15.5)
WBC: 3.8 10*3/uL — ABNORMAL LOW (ref 4.0–10.5)
nRBC: 0 % (ref 0.0–0.2)

## 2019-05-16 LAB — MAGNESIUM: Magnesium: 2.5 mg/dL — ABNORMAL HIGH (ref 1.7–2.4)

## 2019-05-16 LAB — HEPARIN LEVEL (UNFRACTIONATED): Heparin Unfractionated: 1.46 IU/mL — ABNORMAL HIGH (ref 0.30–0.70)

## 2019-05-16 LAB — APTT: aPTT: 93 seconds — ABNORMAL HIGH (ref 24–36)

## 2019-05-16 NOTE — Telephone Encounter (Signed)
Sent as FYI  Received notification fax of inpatient admission regarding her at [facility]. She was just admitted yesterday. Will receive notification via fax once pt has been discharged.

## 2019-05-16 NOTE — Progress Notes (Signed)
ANTICOAGULATION CONSULT NOTE - Initial Consult  Pharmacy Consult for heparin Indication: chest pain/ACS  Allergies  Allergen Reactions  . Iodinated Diagnostic Agents Rash and Other (See Comments)    CT Contrast    Patient Measurements: Height: 5\' 6"  (167.6 cm) Weight: 95.5 kg (210 lb 8.6 oz) IBW/kg (Calculated) : 59.3 Heparin Dosing Weight: 80.5 kg  Vital Signs: Temp: 97.6 F (36.4 C) (04/23 0400) Temp Source: Oral (04/23 0400) BP: 117/61 (04/23 0850) Pulse Rate: 72 (04/23 0850)  Labs: Recent Labs    05/15/19 0450 05/15/19 0450 05/15/19 0755 05/15/19 1148 05/15/19 1246 05/15/19 1253 05/15/19 1256 05/15/19 2050 05/16/19 0445  HGB 13.7  --   --   --   --   --   --   --  12.2  HCT 44.9  --   --   --   --   --   --   --  39.8  PLT 141*  --   --   --   --   --   --   --  133*  APTT  --   --   --   --   --  29  --  99* 93*  LABPROT  --   --   --   --   --  13.8  --   --   --   INR  --   --   --   --   --  1.1  --   --   --   HEPARINUNFRC  --   --   --   --  1.42*  --   --   --  1.46*  CREATININE 1.01*  --   --   --   --   --   --   --  0.87  TROPONINIHS 17   < > 165* 352*  --   --  319*  --   --    < > = values in this interval not displayed.    Estimated Creatinine Clearance: 56.1 mL/min (by C-G formula based on SCr of 0.87 mg/dL).   Assessment: Pharmacy consulted to dose heparin in patient with ACS/STEMI.  Patient on Eliquis prior to admission with last dose given 4/21 0500.  Will need to monitor based on aPTT until correlation with heparin levels.  Trop high sens: [id number]  05/16/19 Update: aPTT:  93 seconds, within goal therapeutic range on heparin at 1000 units/hr Heparin level: 1.46 IU/mL (still not correlating with aPTT) CBC: H/H-->within normals limits   Plates: 141>133 RN reports no bleeding complications or issues with infusion   Goal of Therapy:  Heparin level 0.3-0.7 units/ml aPTT 66-102 seconds Monitor platelets by anticoagulation protocol:  Yes   Plan:   Continue  heparin infusion at 1000 units/hr Check anti-Xa level and aPTT  daily while on heparin until correlation Continue to monitor H&H and platelets Monitor for signs and symptoms of bleeding   Despina Pole, Pharm. D. Clinical Pharmacist 05/16/2019 9:03 AM

## 2019-05-16 NOTE — Plan of Care (Signed)

## 2019-05-16 NOTE — Telephone Encounter (Signed)
Noted: admitted with SOB/hypoxia, troponin up, +CHF exac (echo with mod reduced EF + DD). Pt to go to [facility] 05/19/19 for cath to r/o obstructive CAD. Signed:  Crissie Sickles, MD           05/16/2019

## 2019-05-16 NOTE — Progress Notes (Signed)
PROGRESS NOTE    Amanda Sellers  [id number] DOB: 1932-03-28 DOA: 05/15/2019 PCP: Tammi Sou, MD   Brief Narrative:  Per HPI: Amanda Sellers is a 86 y.o. female with medical history significant for hypertension, hypothyroidism, CKD stage III, atrial fibrillation on Eliquis, COPD on intermittent home oxygen, OSA with obesity on CPAP, non-Hodgkin's lymphoma, anemia of chronic disease, dyslipidemia, and diastolic heart failure who presented to the ED with acute onset of shortness of breath that began at approximately 4 AM.  Her pulse oximetry at home was approximately 70% and once EMS arrived they had placed her on nonrebreather mask and she was still noted to have saturations in the mid 80th percentile.  She had denied any chest pain initially, but now claims that she has had some intermittent substernal chest pain with radiation to her left arm over the last couple weeks.  Her legs have demonstrated swelling and she does not check her weight regularly.  She claims to be taking her home Lasix 60 mg twice daily and has had good urine output.  4/23: Patient appears to be diuresing quite well with approximately 1.6 L of diuresis overnight on the Lasix.  Lower extremity edema appears to be improving as well.  Continue IV Lasix for ongoing diuresis and wean oxygen as tolerated.  Provide bedside commode to assist with urination.  Appreciate cardiology recommendations with anticipated transfer to [facility] on 4/26 for cardiac catheterization.  Assessment & Plan:   Active Problems:   Acute hypoxemic respiratory failure (HCC)   Acute hypoxemic respiratory failure secondary to acute on chronic diastolic congestive heart failure decompensation-improving -Patient remains on 2 L nasal cannula oxygen -Continue IV Lasix 60 mg twice daily and monitor I's and O's and daily weights -Fluid restriction -2D echocardiogram performed with diminished LVEF of 35-40% noted  Elevated  troponin -Appreciate cardiology recommendations, continue telemetry monitoring -Remain on IV heparin -Plan for heart catheterization at [facility] on 4/26  Mild hypokalemia-repleted -Order supplementation and follow with ongoing diuresis  Paroxysmal atrial fibrillation -Currently in sinus rhythm -Hold Eliquis for now and maintain on heparin drip for anticoagulation -Continue amiodarone  Hypertension -Monitor blood pressures carefully and hold valsartan for now with aggressive IV diuresis  CKD stage IIIa -Current creatinine of 1.01 which appears to be near baseline of 0.8-1 -Continue to follow repeat labs with ongoing diuresis  Hypothyroidism -Continue levothyroxine -Recent TSH 02/03/2019 of 3.41  Dyslipidemia -Continue statin  COPD on intermittent home oxygen -No bronchospasms currently noted -We will order breathing treatments as needed  Non-Hodgkin's B-cell lymphoma -Follows with Dr. Benay Spice, Oncology  Obesity/OSA -CPAP at night   DVT prophylaxis: Heparin drip Code Status: Full code Family Communication: Daughter at bedside Disposition Plan: Continue ongoing diuresis with IV Lasix and anticipate transfer to [facility] for cardiac catheterization by 4/26.   Consultants:   Cardiology  Procedures:   2D echocardiogram 4/22 with LVEF 35-40%  Antimicrobials:   None   Subjective: Patient seen and evaluated today with no new acute complaints or concerns. No acute concerns or events noted overnight.  She did have some intermittent left arm pain overnight and has had excellent diuresis.  Objective: Vitals:   05/15/19 2122 05/16/19 0015 05/16/19 0400 05/16/19 0850  BP: 126/68 128/62 133/66 117/61  Pulse: 72 68 73 72  Resp: (!) 21 20 20    Temp: 97.7 F (36.5 C) 97.8 F (36.6 C) 97.6 F (36.4 C)   TempSrc: Oral Oral Oral   SpO2: 96% 96% 97%  Weight:      Height:        Intake/Output Summary (Last 24 hours) at 05/16/2019 1251 Last data filed  at 05/16/2019 0900 Gross per 24 hour  Intake 418.16 ml  Output 1650 ml  Net -1231.84 ml   Filed Weights   05/15/19 0453  Weight: 95.5 kg    Examination:  General exam: Appears calm and comfortable  Respiratory system: Clear to auscultation. Respiratory effort normal.  Currently on 2 L nasal cannula oxygen Cardiovascular system: S1 & S2 heard, RRR. No JVD, murmurs, rubs, gallops or clicks.  Bilateral 1-2+ ankle edema noted. Gastrointestinal system: Abdomen is nondistended, soft and nontender. No organomegaly or masses felt. Normal bowel sounds heard. Central nervous system: Alert and oriented. No focal neurological deficits. Extremities: Symmetric 5 x 5 power. Skin: No rashes, lesions or ulcers Psychiatry: Judgement and insight appear normal. Mood & affect appropriate.     Data Reviewed: I have personally reviewed following labs and imaging studies  CBC: Recent Labs  Lab 05/15/19 0450 05/16/19 0445  WBC 5.2 3.8*  NEUTROABS 2.5  --   HGB 13.7 12.2  HCT 44.9 39.8  MCV 94.9 94.1  PLT 141* [id number]*   Basic Metabolic Panel: Recent Labs  Lab 05/15/19 0450 05/16/19 0445  NA 141 141  K 3.4* 3.9  CL 99 101  CO2 30 28  GLUCOSE 175* 131*  BUN 21 16  CREATININE 1.01* 0.87  CALCIUM 8.7* 8.8*  MG 2.6* 2.5*   GFR: Estimated Creatinine Clearance: 56.1 mL/min (by C-G formula based on SCr of 0.87 mg/dL). Liver Function Tests: Recent Labs  Lab 05/15/19 0450  AST 53*  ALT 49*  ALKPHOS 52  BILITOT 0.8  PROT 7.0  ALBUMIN 4.3   No results for input(s): LIPASE, AMYLASE in the last 168 hours. No results for input(s): AMMONIA in the last 168 hours. Coagulation Profile: Recent Labs  Lab 05/15/19 1253  INR 1.1   Cardiac Enzymes: No results for input(s): CKTOTAL, CKMB, CKMBINDEX, TROPONINI in the last 168 hours. BNP (last 3 results) No results for input(s): PROBNP in the last 8760 hours. HbA1C: No results for input(s): HGBA1C in the last 72 hours. CBG: No results for  input(s): GLUCAP in the last 168 hours. Lipid Profile: No results for input(s): CHOL, HDL, LDLCALC, TRIG, CHOLHDL, LDLDIRECT in the last 72 hours. Thyroid Function Tests: No results for input(s): TSH, T4TOTAL, FREET4, T3FREE, THYROIDAB in the last 72 hours. Anemia Panel: No results for input(s): VITAMINB12, FOLATE, FERRITIN, TIBC, IRON, RETICCTPCT in the last 72 hours. Sepsis Labs: No results for input(s): PROCALCITON, LATICACIDVEN in the last 168 hours.  Recent Results (from the past 240 hour(s))  Respiratory Panel by RT PCR (Flu A&B, Covid) - Nasopharyngeal Swab     Status: None   Collection Time: 05/15/19  4:55 AM   Specimen: Nasopharyngeal Swab  Result Value Ref Range Status   SARS Coronavirus 2 by RT PCR NEGATIVE NEGATIVE Final    Comment: (NOTE) SARS-CoV-2 target nucleic acids are NOT DETECTED. The SARS-CoV-2 RNA is generally detectable in upper respiratoy specimens during the acute phase of infection. The lowest concentration of SARS-CoV-2 viral copies this assay can detect is 131 copies/mL. A negative result does not preclude SARS-Cov-2 infection and should not be used as the sole basis for treatment or other patient management decisions. A negative result may occur with  improper specimen collection/handling, submission of specimen other than nasopharyngeal swab, presence of viral mutation(s) within the areas targeted by this assay,  and inadequate number of viral copies (<131 copies/mL). A negative result must be combined with clinical observations, patient history, and epidemiological information. The expected result is Negative. Fact Sheet for Patients:  PinkCheek.be Fact Sheet for Healthcare Providers:  GravelBags.it This test is not yet ap proved or cleared by the [place] FDA and  has been authorized for detection and/or diagnosis of SARS-CoV-2 by FDA under an Emergency Use Authorization (EUA). This EUA  will remain  in effect (meaning this test can be used) for the duration of the COVID-19 declaration under Section 564(b)(1) of the Act, 21 U.S.C. section 360bbb-3(b)(1), unless the authorization is terminated or revoked sooner.    Influenza A by PCR NEGATIVE NEGATIVE Final   Influenza B by PCR NEGATIVE NEGATIVE Final    Comment: (NOTE) The Xpert Xpress SARS-CoV-2/FLU/RSV assay is intended as an aid in  the diagnosis of influenza from Nasopharyngeal swab specimens and  should not be used as a sole basis for treatment. Nasal washings and  aspirates are unacceptable for Xpert Xpress SARS-CoV-2/FLU/RSV  testing. Fact Sheet for Patients: PinkCheek.be Fact Sheet for Healthcare Providers: GravelBags.it This test is not yet approved or cleared by the [place] FDA and  has been authorized for detection and/or diagnosis of SARS-CoV-2 by  FDA under an Emergency Use Authorization (EUA). This EUA will remain  in effect (meaning this test can be used) for the duration of the  Covid-19 declaration under Section 564(b)(1) of the Act, 21  U.S.C. section 360bbb-3(b)(1), unless the authorization is  terminated or revoked. Performed at [facility], [address], [place], [place] [address]          Radiology Studies: [facility] 1 View  Result Date: 05/15/2019 CLINICAL DATA:  Shortness of breath. EXAM: PORTABLE CHEST 1 VIEW COMPARISON:  01/26/2017. FINDINGS: Cardiomegaly. New onset of diffuse severe bilateral pulmonary interstitial infiltrates/edema. No prominent pleural effusion or pneumothorax. Sliding hiatal hernia again noted. No acute bony abnormality. IMPRESSION: Cardiomegaly. New onset of diffuse severe bilateral pulmonary interstitial infiltrates/edema noted on today's exam. Electronically Signed   By: Marcello Moores  Register   On: 05/15/2019 05:16   ECHOCARDIOGRAM COMPLETE  Result Date: 05/15/2019    ECHOCARDIOGRAM REPORT    Patient Name:   Amanda Sellers Date of Exam: 05/15/2019 Medical Rec #:  [id number]         Height:       66.0 in Accession #:    [id number]        Weight:       210.5 lb Date of Birth:  04-Jun-1932          BSA:          2.044 m Patient Age:    15 years          BP:           122/70 mmHg Patient Gender: F                 HR:           78 bpm. Exam Location:  [facility] Procedure: 2D Echo Indications:    Elevated Troponin  History:        Patient has prior history of Echocardiogram examinations, most                 recent 10/16/2014. CHF, Arrythmias:Atrial Fibrillation,                 Signs/Symptoms:Dyspnea; Risk Factors:Hypertension, Dyslipidemia  and Non-Smoker.  Sonographer:    Leavy Cella RDCS (AE) Referring Phys: [phone] Royanne Foots Cecil  1. Left ventricular ejection fraction, by estimation, is 35 to 40%. The left ventricle has moderately decreased function. The left ventricle demonstrates global hypokinesis. There is mild concentric left ventricular hypertrophy. Left ventricular diastolic parameters are consistent with Grade II diastolic dysfunction (pseudonormalization). Elevated left ventricular end-diastolic pressure.  2. Right ventricular systolic function is low normal. The right ventricular size is normal.  3. Left atrial size was mildly dilated.  4. The mitral valve is degenerative. Mild mitral valve regurgitation.  5. The aortic valve is tricuspid. Aortic valve regurgitation is trivial. No aortic stenosis is present. FINDINGS  Left Ventricle: Left ventricular ejection fraction, by estimation, is 35 to 40%. The left ventricle has moderately decreased function. The left ventricle demonstrates global hypokinesis. The left ventricular internal cavity size was normal in size. There is mild concentric left ventricular hypertrophy. Left ventricular diastolic parameters are consistent with Grade II diastolic dysfunction (pseudonormalization). Elevated left ventricular end-diastolic  pressure. Right Ventricle: The right ventricular size is normal. No increase in right ventricular wall thickness. Right ventricular systolic function is low normal. Left Atrium: Left atrial size was mildly dilated. Right Atrium: Right atrial size was normal in size. Pericardium: There is no evidence of pericardial effusion. Mitral Valve: The mitral valve is degenerative in appearance. There is mild thickening of the mitral valve leaflet(s). Mild to moderate mitral annular calcification. Mild mitral valve regurgitation. Tricuspid Valve: The tricuspid valve is grossly normal. Tricuspid valve regurgitation is mild. Aortic Valve: The aortic valve is tricuspid. Aortic valve regurgitation is trivial. Aortic regurgitation PHT measures 382 msec. No aortic stenosis is present. Mild aortic valve annular calcification. Pulmonic Valve: The pulmonic valve was grossly normal. Pulmonic valve regurgitation is mild. Aorta: The aortic root is normal in size and structure. Venous: The inferior vena cava was not well visualized. IAS/Shunts: The interatrial septum was not well visualized.  LEFT VENTRICLE PLAX 2D LVIDd:         4.32 cm  Diastology LVIDs:         3.41 cm  LV e' lateral:   4.87 cm/s LV PW:         1.19 cm  LV E/e' lateral: 24.0 LV IVS:        1.28 cm  LV e' medial:    2.92 cm/s LVOT diam:     1.90 cm  LV E/e' medial:  40.1 LVOT Area:     2.84 cm  RIGHT VENTRICLE RV S prime:     11.30 cm/s TAPSE (M-mode): 1.8 cm LEFT ATRIUM             Index LA diam:        3.20 cm 1.57 cm/m LA Vol (A2C):   90.2 ml 44.13 ml/m LA Vol (A4C):   59.9 ml 29.31 ml/m LA Biplane Vol: 73.7 ml 36.06 ml/m  AORTIC VALVE AI PHT:      382 msec  AORTA Ao Root diam: 3.10 cm MITRAL VALVE MV Area (PHT): 3.43 cm     SHUNTS MV Decel Time: 221 msec     Systemic Diam: 1.90 cm MR Peak grad: 73.6 mmHg MR Mean grad: 43.0 mmHg MR Vmax:      429.00 cm/s MR Vmean:     295.0 cm/s MV E velocity: 117.00 cm/s MV A velocity: 101.00 cm/s MV E/A ratio:  1.16 Kate Sable MD Electronically signed by Kate Sable MD Signature Date/Time: 05/15/2019/3:10:20  PM    Final         Scheduled Meds:  amiodarone  100 mg Oral Daily   ferrous sulfate  325 mg Oral BID WC   furosemide  60 mg Intravenous Q12H   levothyroxine  100 mcg Oral QAC breakfast   potassium chloride  40 mEq Oral BID   pravastatin  80 mg Oral q1800   sodium chloride flush  3 mL Intravenous Q12H   Continuous Infusions:  sodium chloride     heparin 1,000 Units/hr (05/16/19 0845)     LOS: 1 day    Time spent: 35 minutes    Nataly Pacifico D Manuella Ghazi, DO [facility]  If 7PM-7AM, please contact night-coverage [email or web address] 05/16/2019, 12:51 PM

## 2019-05-16 NOTE — Progress Notes (Signed)
Progress Note  Patient Name: Amanda Sellers Date of Encounter: 05/16/2019  Primary Cardiologist: Glenetta Hew, MD   Subjective   Continues to experience left arm pain. Had some neck pain last night. Denies chest pain. Shortness of breath and leg swelling have significantly improved.  Inpatient Medications    Scheduled Meds: . amiodarone  100 mg Oral Daily  . ferrous sulfate  325 mg Oral BID WC  . furosemide  60 mg Intravenous Q12H  . levothyroxine  100 mcg Oral QAC breakfast  . potassium chloride  40 mEq Oral BID  . pravastatin  80 mg Oral q1800  . sodium chloride flush  3 mL Intravenous Q12H   Continuous Infusions: . sodium chloride    . heparin 1,000 Units/hr (05/16/19 0845)   PRN Meds: sodium chloride, acetaminophen **OR** acetaminophen, ALPRAZolam, ipratropium-albuterol, ondansetron **OR** ondansetron (ZOFRAN) IV, sodium chloride flush, traMADol   Barbarann Ehlers RN was present throughout the entirety of the encounter.  Vital Signs    Vitals:   05/15/19 2122 05/16/19 0015 05/16/19 0400 05/16/19 0850  BP: 126/68 128/62 133/66 117/61  Pulse: 72 68 73 72  Resp: (!) 21 20 20    Temp: 97.7 F (36.5 C) 97.8 F (36.6 C) 97.6 F (36.4 C)   TempSrc: Oral Oral Oral   SpO2: 96% 96% 97%   Weight:      Height:        Intake/Output Summary (Last 24 hours) at 05/16/2019 0911 Last data filed at 05/16/2019 0900 Gross per 24 hour  Intake 418.16 ml  Output 1650 ml  Net -1231.84 ml   Filed Weights   05/15/19 0453  Weight: 95.5 kg    Telemetry    NSR with isolated PVC's - Personally Reviewed   Physical Exam   GEN: No acute distress.   Neck: No JVD Cardiac: RRR, no murmurs, rubs, or gallops.  Respiratory: Clear to auscultation bilaterally. GI: Soft, nontender, non-distended  MS: 1+ b/l LE edema; No deformity. Neuro:  Nonfocal  Psych: Normal affect   Labs    Chemistry Recent Labs  Lab 05/15/19 0450 05/16/19 0445  NA 141 141  K 3.4* 3.9  CL 99  101  CO2 30 28  GLUCOSE 175* 131*  BUN 21 16  CREATININE 1.01* 0.87  CALCIUM 8.7* 8.8*  PROT 7.0  --   ALBUMIN 4.3  --   AST 53*  --   ALT 49*  --   ALKPHOS 52  --   BILITOT 0.8  --   GFRNONAA 51* >60  GFRAA 59* >60  ANIONGAP 12 12     Hematology Recent Labs  Lab 05/15/19 0450 05/16/19 0445  WBC 5.2 3.8*  RBC 4.73 4.23  HGB 13.7 12.2  HCT 44.9 39.8  MCV 94.9 94.1  MCH 29.0 28.8  MCHC 30.5 30.7  RDW 13.6 13.8  PLT 141* 133*    Cardiac EnzymesNo results for input(s): TROPONINI in the last 168 hours. No results for input(s): TROPIPOC in the last 168 hours.   BNP Recent Labs  Lab 05/15/19 0450  BNP 138.0*     DDimer No results for input(s): DDIMER in the last 168 hours.   Radiology    DG Chest Port 1 View  Result Date: 05/15/2019 CLINICAL DATA:  Shortness of breath. EXAM: PORTABLE CHEST 1 VIEW COMPARISON:  01/26/2017. FINDINGS: Cardiomegaly. New onset of diffuse severe bilateral pulmonary interstitial infiltrates/edema. No prominent pleural effusion or pneumothorax. Sliding hiatal hernia again noted. No acute bony abnormality. IMPRESSION: Cardiomegaly.  New onset of diffuse severe bilateral pulmonary interstitial infiltrates/edema noted on today's exam. Electronically Signed   By: Marcello Moores  Register   On: 05/15/2019 05:16   ECHOCARDIOGRAM COMPLETE  Result Date: 05/15/2019    ECHOCARDIOGRAM REPORT   Patient Name:   Amanda Sellers Date of Exam: 05/15/2019 Medical Rec #:  [id number]         Height:       66.0 in Accession #:    [id number]        Weight:       210.5 lb Date of Birth:  [birth year]          BSA:          2.044 m Patient Age:    86 years          BP:           122/70 mmHg Patient Gender: F                 HR:           78 bpm. Exam Location:  [facility] Procedure: 2D Echo Indications:    Elevated Troponin  History:        Patient has prior history of Echocardiogram examinations, most                 recent 10/16/2014. CHF, Arrythmias:Atrial Fibrillation,                  Signs/Symptoms:Dyspnea; Risk Factors:Hypertension, Dyslipidemia                 and Non-Smoker.  Sonographer:    Leavy Cella RDCS (AE) Referring Phys: [id number] Royanne Foots [place]  1. Left ventricular ejection fraction, by estimation, is 35 to 40%. The left ventricle has moderately decreased function. The left ventricle demonstrates global hypokinesis. There is mild concentric left ventricular hypertrophy. Left ventricular diastolic parameters are consistent with Grade II diastolic dysfunction (pseudonormalization). Elevated left ventricular end-diastolic pressure.  2. Right ventricular systolic function is low normal. The right ventricular size is normal.  3. Left atrial size was mildly dilated.  4. The mitral valve is degenerative. Mild mitral valve regurgitation.  5. The aortic valve is tricuspid. Aortic valve regurgitation is trivial. No aortic stenosis is present. FINDINGS  Left Ventricle: Left ventricular ejection fraction, by estimation, is 35 to 40%. The left ventricle has moderately decreased function. The left ventricle demonstrates global hypokinesis. The left ventricular internal cavity size was normal in size. There is mild concentric left ventricular hypertrophy. Left ventricular diastolic parameters are consistent with Grade II diastolic dysfunction (pseudonormalization). Elevated left ventricular end-diastolic pressure. Right Ventricle: The right ventricular size is normal. No increase in right ventricular wall thickness. Right ventricular systolic function is low normal. Left Atrium: Left atrial size was mildly dilated. Right Atrium: Right atrial size was normal in size. Pericardium: There is no evidence of pericardial effusion. Mitral Valve: The mitral valve is degenerative in appearance. There is mild thickening of the mitral valve leaflet(s). Mild to moderate mitral annular calcification. Mild mitral valve regurgitation. Tricuspid Valve: The tricuspid valve is grossly normal.  Tricuspid valve regurgitation is mild. Aortic Valve: The aortic valve is tricuspid. Aortic valve regurgitation is trivial. Aortic regurgitation PHT measures 382 msec. No aortic stenosis is present. Mild aortic valve annular calcification. Pulmonic Valve: The pulmonic valve was grossly normal. Pulmonic valve regurgitation is mild. Aorta: The aortic root is normal in size and structure. Venous: The inferior vena cava was not well visualized.  IAS/Shunts: The interatrial septum was not well visualized.  LEFT VENTRICLE PLAX 2D LVIDd:         4.32 cm  Diastology LVIDs:         3.41 cm  LV e' lateral:   4.87 cm/s LV PW:         1.19 cm  LV E/e' lateral: 24.0 LV IVS:        1.28 cm  LV e' medial:    2.92 cm/s LVOT diam:     1.90 cm  LV E/e' medial:  40.1 LVOT Area:     2.84 cm  RIGHT VENTRICLE RV S prime:     11.30 cm/s TAPSE (M-mode): 1.8 cm LEFT ATRIUM             Index LA diam:        3.20 cm 1.57 cm/m LA Vol (A2C):   90.2 ml 44.13 ml/m LA Vol (A4C):   59.9 ml 29.31 ml/m LA Biplane Vol: 73.7 ml 36.06 ml/m  AORTIC VALVE AI PHT:      382 msec  AORTA Ao Root diam: 3.10 cm MITRAL VALVE MV Area (PHT): 3.43 cm     SHUNTS MV Decel Time: 221 msec     Systemic Diam: 1.90 cm MR Peak grad: 73.6 mmHg MR Mean grad: 43.0 mmHg MR Vmax:      429.00 cm/s MR Vmean:     295.0 cm/s MV E velocity: 117.00 cm/s MV A velocity: 101.00 cm/s MV E/A ratio:  1.16 Kate Sable MD Electronically signed by Kate Sable MD Signature Date/Time: 05/15/2019/3:10:20 PM    Final     Cardiac Studies   Echo 05/15/19:  1. Left ventricular ejection fraction, by estimation, is 35 to 40%. The  left ventricle has moderately decreased function. The left ventricle  demonstrates global hypokinesis. There is mild concentric left ventricular  hypertrophy. Left ventricular  diastolic parameters are consistent with Grade II diastolic dysfunction  (pseudonormalization). Elevated left ventricular end-diastolic pressure.  2. Right ventricular  systolic function is low normal. The right  ventricular size is normal.  3. Left atrial size was mildly dilated.  4. The mitral valve is degenerative. Mild mitral valve regurgitation.  5. The aortic valve is tricuspid. Aortic valve regurgitation is trivial.  No aortic stenosis is present.   Patient Profile     86 y.o. female a hx of paroxysmal atrial fibrillation and chronic diastolic heart failure who is being seen today for the evaluation of CHF and troponin elevation at the request of Dr. Manuella Ghazi.  Assessment & Plan    1.  Acute combined systolic and diastolic heart failure: LV systolic function had been normal in September 2016 and is now moderately reduced, LVEF 35 to 40% with grade 2 diastolic dysfunction.  She is currently on 60 mg IV twice daily and has had nearly 2L output in last 24+ hrs.  For the time being I would avoid beta-blockers given acute heart failure and so as not to potentiate worsening heart failure.  She will continue on amiodarone for paroxysmal atrial fibrillation.  Once she is no longer orthopneic I would recommend transferring to [facility] for right and left heart catheterization and coronary angiography, possibly on 05/19/2019.  2.  Troponin elevation:  Troponins were initially 17 and peaked at 352 and are down to 319. While there likely indicative of demand ischemia in the context of decompensated heart failure, obstructive coronary artery disease needs to be ruled out given marked reduction in LV systolic  function.  She denies exertional chest pain but has had intermittent left arm pain over the past week and continues to experience it while hospitalized, and I wonder if this is her anginal equivalent.  Once she is no longer orthopneic I would recommend transferring to [facility] for right and left heart catheterization and coronary angiography, possibly on 05/19/2019.  3.  Paroxysmal atrial fibrillation: Currently in sinus rhythm. Eliquis on hold and currently on  IV heparin.  Continue amiodarone 100 mg daily.  4.  Hypertension: Blood pressure is normal.  Valsartan held by internal medicine given need for IV diuresis in context of CKD stage III.  5.  Chronic kidney disease stage III: Creatinine down to 0.87 from 1.01 on 4/22.  This will need daily monitoring given need for IV diuresis and upcoming cardiac catheterization.  6.  Hyperlipidemia: Continue pravastatin.  7.  Non-Hodgkin's lymphoma: This is in remission.  She was initially diagnosed in July 2016 and underwent 4 cycles of chemotherapy with bendamustine and rituximab.  She last saw Dr. Benay Spice on 04/18/2019.  8.  Obstructive sleep apnea: On CPAP.    For questions or updates, please contact [place] Please consult [email or web address] for contact info under Cardiology/STEMI.      Signed, Kate Sable, MD  05/16/2019, 9:11 AM

## 2019-05-17 LAB — BASIC METABOLIC PANEL
Anion gap: 12 (ref 5–15)
BUN: 19 mg/dL (ref 8–23)
CO2: 29 mmol/L (ref 22–32)
Calcium: 9.3 mg/dL (ref 8.9–10.3)
Chloride: 101 mmol/L (ref 98–111)
Creatinine, Ser: 1.07 mg/dL — ABNORMAL HIGH (ref 0.44–1.00)
GFR calc Af Amer: 55 mL/min — ABNORMAL LOW (ref >60)
GFR calc non Af Amer: 48 mL/min — ABNORMAL LOW (ref >60)
Glucose, Bld: 128 mg/dL — ABNORMAL HIGH (ref 70–99)
Potassium: 4.1 mmol/L (ref 3.5–5.1)
Sodium: 142 mmol/L (ref 135–145)

## 2019-05-17 LAB — CBC
HCT: 38.3 % (ref 36.0–46.0)
Hemoglobin: 12 g/dL (ref 12.0–15.0)
MCH: 29.3 pg (ref 26.0–34.0)
MCHC: 31.3 g/dL (ref 30.0–36.0)
MCV: 93.4 fL (ref 80.0–100.0)
Platelets: 127 10*3/uL — ABNORMAL LOW (ref 150–400)
RBC: 4.1 MIL/uL (ref 3.87–5.11)
RDW: 13.8 % (ref 11.5–15.5)
WBC: 4.4 10*3/uL (ref 4.0–10.5)
nRBC: 0 % (ref 0.0–0.2)

## 2019-05-17 LAB — HEPARIN LEVEL (UNFRACTIONATED): Heparin Unfractionated: 0.74 IU/mL — ABNORMAL HIGH (ref 0.30–0.70)

## 2019-05-17 LAB — MAGNESIUM: Magnesium: 2.5 mg/dL — ABNORMAL HIGH (ref 1.7–2.4)

## 2019-05-17 LAB — APTT: aPTT: 89 seconds — ABNORMAL HIGH (ref 24–36)

## 2019-05-17 MED ORDER — FUROSEMIDE 40 MG PO TABS: 60.0000 mg | ORAL_TABLET | Freq: Two times a day (BID) | ORAL | Status: DC

## 2019-05-17 MED ORDER — FUROSEMIDE 40 MG PO TABS
60.0000 mg | ORAL_TABLET | Freq: Two times a day (BID) | ORAL | Status: DC
  Administered 2019-05-18 – 2019-05-20 (×5): 60 mg via ORAL
  Filled 2019-05-17 (×5): qty 1

## 2019-05-17 NOTE — Progress Notes (Signed)
ANTICOAGULATION CONSULT NOTE - Initial Consult  Pharmacy Consult for heparin Indication: chest pain/ACS  Allergies  Allergen Reactions  . Iodinated Diagnostic Agents Rash and Other (See Comments)    CT Contrast    Patient Measurements: Height: 5\' 6"  (167.6 cm) Weight: 95.5 kg (210 lb 8.6 oz) IBW/kg (Calculated) : 59.3 Heparin Dosing Weight: 80.5 kg  Vital Signs: Temp: 97.7 F (36.5 C) (04/24 0610) Temp Source: Oral (04/24 0610) BP: 111/87 (04/24 0830) Pulse Rate: 71 (04/24 0830)  Labs: Recent Labs    05/15/19 0450 05/15/19 0450 05/15/19 0755 05/15/19 1148 05/15/19 1246 05/15/19 1253 05/15/19 1253 05/15/19 1256 05/15/19 2050 05/16/19 0445 05/17/19 0500 05/17/19 0501  HGB 13.7   < >  --   --   --   --   --   --   --  12.2  --  12.0  HCT 44.9  --   --   --   --   --   --   --   --  39.8  --  38.3  PLT 141*  --   --   --   --   --   --   --   --  133*  --  127*  APTT  --   --   --   --   --  29   < >  --  99* 93*  --  89*  LABPROT  --   --   --   --   --  13.8  --   --   --   --   --   --   INR  --   --   --   --   --  1.1  --   --   --   --   --   --   HEPARINUNFRC  --   --   --   --  1.42*  --   --   --   --  1.46* 0.74*  --   CREATININE 1.01*  --   --   --   --   --   --   --   --  0.87  --  1.07*  TROPONINIHS 17   < > 165* 352*  --   --   --  319*  --   --   --   --    < > = values in this interval not displayed.    Estimated Creatinine Clearance: 45.6 mL/min (A) (by C-G formula based on SCr of 1.07 mg/dL (H)).   Assessment: Pharmacy consulted to dose heparin in patient with ACS/STEMI.  Patient on Eliquis prior to admission with last dose given 4/21 0500.  Will need to monitor based on aPTT until correlation with heparin levels.  Trop high sens: [id number]  05/17/19  0800 update: aPTT: 89 seconds, within goal therapeutic range on heparin at 1000 units/hr Heparin level: 0.74 IU/mL--> almost  correlates with aPTT  CBC: H/H-->within normals limits   Plates:  [phone] RN reports no bleeding complications or issues with infusion site   Goal of Therapy:  Heparin level 0.3-0.7 units/ml aPTT 66-102 seconds Monitor platelets by anticoagulation protocol: Yes   Plan:   Continue  heparin infusion at 1000 units/hr Check anti-Xa level and aPTT  daily while on heparin until correlation Continue to monitor H&H and platelets Monitor for signs and symptoms of bleeding   Despina Pole, Pharm. D. Clinical Pharmacist 05/17/2019 1:25 PM

## 2019-05-17 NOTE — Progress Notes (Signed)
PROGRESS NOTE    Amanda Sellers  [id number] DOB: 01-24-1932 DOA: 05/15/2019 PCP: Tammi Sou, MD   Brief Narrative:  Per HPI: Amanda Sellers Friddleis a 86 y.o.femalewith medical history significant forhypertension, hypothyroidism, CKD stage III, atrial fibrillation on Eliquis, COPD on intermittent home oxygen, OSA with obesity on CPAP, non-Hodgkin's lymphoma, anemia of chronic disease, dyslipidemia, and diastolic heart failure who presented to the ED with acute onset of shortness of breath that began at approximately 4 AM. Her pulse oximetry at home was approximately 70% and once EMS arrived they had placed her on nonrebreather mask and she was still noted to have saturations in the mid 80th percentile. She had denied any chest pain initially, but now claims that she has had some intermittent substernal chest pain with radiation to her left arm over the last couple weeks. Her legs have demonstrated swelling and she does not check her weight regularly. She claims to be taking her home Lasix 60 mg twice daily and has had good urine output.  4/23: Patient appears to be diuresing quite well with approximately 1.6 L of diuresis overnight on the Lasix.  Lower extremity edema appears to be improving as well.  Continue IV Lasix for ongoing diuresis and wean oxygen as tolerated.  Provide bedside commode to assist with urination.  Appreciate cardiology recommendations with anticipated transfer to [facility] on 4/26 for cardiac catheterization.  4/24: Diuresis appears to have slowed and patient appears euvolemic today.  Will hold further Lasix as she appears to have diuresed to her renal tolerance.  She did receive 60 mg of IV Lasix this morning.  Recheck a.m. labs and anticipate transfer to [facility] tomorrow in anticipation for cardiac catheterization on 4/26.  Assessment & Plan:   Active Problems:   Acute hypoxemic respiratory failure (HCC)   Acute hypoxemic respiratory failure  secondary to acute on chronic diastolic congestive heart failure decompensation-resolved -Patient now on room air -Discontinue Lasix, she did receive IV Lasix 60 mg this a.m. -Fluid restriction to continue -2D echocardiogram performed with diminished LVEF of 35-40% noted  Elevated troponin -Appreciate cardiology recommendations, continue telemetry monitoring -Remain on IV heparin -Plan for heart catheterization at [facility] on 4/26  Mild hypokalemia-repleted -Order supplementation and follow with ongoing diuresis  Paroxysmal atrial fibrillation -Currently in sinus rhythm -Hold Eliquis for now and maintain on heparin drip for anticoagulation -Continue amiodarone  Hypertension -Monitor blood pressures carefully and hold valsartan for now with aggressive IV diuresis  CKD stage IIIa -Current creatinine of 1.07 which appears to be near baseline of 0.8-1 -Continue to follow repeat labs with ongoing diuresis  Hypothyroidism -Continue levothyroxine -Recent TSH 02/03/2019 of 3.41  Dyslipidemia -Continue statin  COPD on intermittent home oxygen -No bronchospasms currently noted -We will order breathing treatments as needed  Non-Hodgkin's B-cell lymphoma -Follows with Dr.Sherrill, Oncology  Obesity/OSA -CPAP at night   DVT prophylaxis: Heparin drip Code Status: Full code Family Communication: Daughter at bedside on 4/23 Disposition Plan:  Hold further Lasix for now and recheck a.m. labs.  Anticipate transfer by tomorrow for cardiac catheterization in a.m.   Consultants:   Cardiology  Procedures:   2D echocardiogram 4/22 with LVEF 35-40%  Antimicrobials:   None  Subjective: Patient seen and evaluated today with no new acute complaints or concerns. No acute concerns or events noted overnight.  She states that she is feeling much better and has no further lower extremity edema.  She slept well and is currently off oxygen.  She  denies any further  chest pain or shortness of breath this morning.  Objective: Vitals:   05/17/19 0215 05/17/19 0610 05/17/19 0740 05/17/19 0830  BP: (!) 116/46 104/85  111/87  Pulse: 82 82  71  Resp:      Temp: 97.6 F (36.4 C) 97.7 F (36.5 C)    TempSrc: Oral Oral    SpO2: 98% 100% 96%   Weight:      Height:        Intake/Output Summary (Last 24 hours) at 05/17/2019 1020 Last data filed at 05/17/2019 0500 Gross per 24 hour  Intake 494.09 ml  Output 1050 ml  Net -555.91 ml   Filed Weights   05/15/19 0453  Weight: 95.5 kg    Examination:  General exam: Appears calm and comfortable  Respiratory system: Clear to auscultation. Respiratory effort normal.  Currently on room air. Cardiovascular system: S1 & S2 heard, RRR. No JVD, murmurs, rubs, gallops or clicks. No pedal edema. Gastrointestinal system: Abdomen is nondistended, soft and nontender. No organomegaly or masses felt. Normal bowel sounds heard. Central nervous system: Alert and oriented. No focal neurological deficits. Extremities: Symmetric 5 x 5 power. Skin: No rashes, lesions or ulcers Psychiatry: Judgement and insight appear normal. Mood & affect appropriate.     Data Reviewed: I have personally reviewed following labs and imaging studies  CBC: Recent Labs  Lab 05/15/19 0450 05/16/19 0445 05/17/19 0501  WBC 5.2 3.8* 4.4  NEUTROABS 2.5  --   --   HGB 13.7 12.2 12.0  HCT 44.9 39.8 38.3  MCV 94.9 94.1 93.4  PLT 141* 133* [id number]   Basic Metabolic Panel: Recent Labs  Lab 05/15/19 0450 05/16/19 0445 05/17/19 0501  NA 141 141 142  K 3.4* 3.9 4.1  CL 99 101 101  CO2 30 28 29   GLUCOSE 175* 131* 128*  BUN 21 16 19   CREATININE 1.01* 0.87 1.07*  CALCIUM 8.7* 8.8* 9.3  MG 2.6* 2.5* 2.5*   GFR: Estimated Creatinine Clearance: 45.6 mL/min (A) (by C-G formula based on SCr of 1.07 mg/dL (H)). Liver Function Tests: Recent Labs  Lab 05/15/19 0450  AST 53*  ALT 49*  ALKPHOS 52  BILITOT 0.8  PROT 7.0  ALBUMIN 4.3    No results for input(s): LIPASE, AMYLASE in the last 168 hours. No results for input(s): AMMONIA in the last 168 hours. Coagulation Profile: Recent Labs  Lab 05/15/19 1253  INR 1.1   Cardiac Enzymes: No results for input(s): CKTOTAL, CKMB, CKMBINDEX, TROPONINI in the last 168 hours. BNP (last 3 results) No results for input(s): PROBNP in the last 8760 hours. HbA1C: No results for input(s): HGBA1C in the last 72 hours. CBG: No results for input(s): GLUCAP in the last 168 hours. Lipid Profile: No results for input(s): CHOL, HDL, LDLCALC, TRIG, CHOLHDL, LDLDIRECT in the last 72 hours. Thyroid Function Tests: No results for input(s): TSH, T4TOTAL, FREET4, T3FREE, THYROIDAB in the last 72 hours. Anemia Panel: No results for input(s): VITAMINB12, FOLATE, FERRITIN, TIBC, IRON, RETICCTPCT in the last 72 hours. Sepsis Labs: No results for input(s): PROCALCITON, LATICACIDVEN in the last 168 hours.  Recent Results (from the past 240 hour(s))  Respiratory Panel by RT PCR (Flu A&B, Covid) - Nasopharyngeal Swab     Status: None   Collection Time: 05/15/19  4:55 AM   Specimen: Nasopharyngeal Swab  Result Value Ref Range Status   SARS Coronavirus 2 by RT PCR NEGATIVE NEGATIVE Final    Comment: (NOTE) SARS-CoV-2 target nucleic acids  are NOT DETECTED. The SARS-CoV-2 RNA is generally detectable in upper respiratoy specimens during the acute phase of infection. The lowest concentration of SARS-CoV-2 viral copies this assay can detect is 131 copies/mL. A negative result does not preclude SARS-Cov-2 infection and should not be used as the sole basis for treatment or other patient management decisions. A negative result may occur with  improper specimen collection/handling, submission of specimen other than nasopharyngeal swab, presence of viral mutation(s) within the areas targeted by this assay, and inadequate number of viral copies (<131 copies/mL). A negative result must be combined with  clinical observations, patient history, and epidemiological information. The expected result is Negative. Fact Sheet for Patients:  PinkCheek.be Fact Sheet for Healthcare Providers:  GravelBags.it This test is not yet ap proved or cleared by the [place] FDA and  has been authorized for detection and/or diagnosis of SARS-CoV-2 by FDA under an Emergency Use Authorization (EUA). This EUA will remain  in effect (meaning this test can be used) for the duration of the COVID-19 declaration under Section 564(b)(1) of the Act, 21 U.S.C. section 360bbb-3(b)(1), unless the authorization is terminated or revoked sooner.    Influenza A by PCR NEGATIVE NEGATIVE Final   Influenza B by PCR NEGATIVE NEGATIVE Final    Comment: (NOTE) The Xpert Xpress SARS-CoV-2/FLU/RSV assay is intended as an aid in  the diagnosis of influenza from Nasopharyngeal swab specimens and  should not be used as a sole basis for treatment. Nasal washings and  aspirates are unacceptable for Xpert Xpress SARS-CoV-2/FLU/RSV  testing. Fact Sheet for Patients: PinkCheek.be Fact Sheet for Healthcare Providers: GravelBags.it This test is not yet approved or cleared by the [place] FDA and  has been authorized for detection and/or diagnosis of SARS-CoV-2 by  FDA under an Emergency Use Authorization (EUA). This EUA will remain  in effect (meaning this test can be used) for the duration of the  Covid-19 declaration under Section 564(b)(1) of the Act, 21  U.S.C. section 360bbb-3(b)(1), unless the authorization is  terminated or revoked. Performed at [facility], [address] [place], [place] [address]          Radiology Studies: ECHOCARDIOGRAM COMPLETE  Result Date: 05/15/2019    ECHOCARDIOGRAM REPORT   Patient Name:   BEXLEE LOOPER Date of Exam: 05/15/2019 Medical Rec #:  [id number]          Height:       66.0 in Accession #:    [id number]        Weight:       210.5 lb Date of Birth:  1932-11-12          BSA:          2.044 m Patient Age:    53 years          BP:           122/70 mmHg Patient Gender: F                 HR:           78 bpm. Exam Location:  [facility] Procedure: 2D Echo Indications:    Elevated Troponin  History:        Patient has prior history of Echocardiogram examinations, most                 recent 10/16/2014. CHF, Arrythmias:Atrial Fibrillation,                 Signs/Symptoms:Dyspnea; Risk Factors:Hypertension, Dyslipidemia  and Non-Smoker.  Sonographer:    Leavy Cella RDCS (AE) Referring Phys: [phone] Royanne Foots [place]  1. Left ventricular ejection fraction, by estimation, is 35 to 40%. The left ventricle has moderately decreased function. The left ventricle demonstrates global hypokinesis. There is mild concentric left ventricular hypertrophy. Left ventricular diastolic parameters are consistent with Grade II diastolic dysfunction (pseudonormalization). Elevated left ventricular end-diastolic pressure.  2. Right ventricular systolic function is low normal. The right ventricular size is normal.  3. Left atrial size was mildly dilated.  4. The mitral valve is degenerative. Mild mitral valve regurgitation.  5. The aortic valve is tricuspid. Aortic valve regurgitation is trivial. No aortic stenosis is present. FINDINGS  Left Ventricle: Left ventricular ejection fraction, by estimation, is 35 to 40%. The left ventricle has moderately decreased function. The left ventricle demonstrates global hypokinesis. The left ventricular internal cavity size was normal in size. There is mild concentric left ventricular hypertrophy. Left ventricular diastolic parameters are consistent with Grade II diastolic dysfunction (pseudonormalization). Elevated left ventricular end-diastolic pressure. Right Ventricle: The right ventricular size is normal. No increase in right  ventricular wall thickness. Right ventricular systolic function is low normal. Left Atrium: Left atrial size was mildly dilated. Right Atrium: Right atrial size was normal in size. Pericardium: There is no evidence of pericardial effusion. Mitral Valve: The mitral valve is degenerative in appearance. There is mild thickening of the mitral valve leaflet(s). Mild to moderate mitral annular calcification. Mild mitral valve regurgitation. Tricuspid Valve: The tricuspid valve is grossly normal. Tricuspid valve regurgitation is mild. Aortic Valve: The aortic valve is tricuspid. Aortic valve regurgitation is trivial. Aortic regurgitation PHT measures 382 msec. No aortic stenosis is present. Mild aortic valve annular calcification. Pulmonic Valve: The pulmonic valve was grossly normal. Pulmonic valve regurgitation is mild. Aorta: The aortic root is normal in size and structure. Venous: The inferior vena cava was not well visualized. IAS/Shunts: The interatrial septum was not well visualized.  LEFT VENTRICLE PLAX 2D LVIDd:         4.32 cm  Diastology LVIDs:         3.41 cm  LV e' lateral:   4.87 cm/s LV PW:         1.19 cm  LV E/e' lateral: 24.0 LV IVS:        1.28 cm  LV e' medial:    2.92 cm/s LVOT diam:     1.90 cm  LV E/e' medial:  40.1 LVOT Area:     2.84 cm  RIGHT VENTRICLE RV S prime:     11.30 cm/s TAPSE (M-mode): 1.8 cm LEFT ATRIUM             Index LA diam:        3.20 cm 1.57 cm/m LA Vol (A2C):   90.2 ml 44.13 ml/m LA Vol (A4C):   59.9 ml 29.31 ml/m LA Biplane Vol: 73.7 ml 36.06 ml/m  AORTIC VALVE AI PHT:      382 msec  AORTA Ao Root diam: 3.10 cm MITRAL VALVE MV Area (PHT): 3.43 cm     SHUNTS MV Decel Time: 221 msec     Systemic Diam: 1.90 cm MR Peak grad: 73.6 mmHg MR Mean grad: 43.0 mmHg MR Vmax:      429.00 cm/s MR Vmean:     295.0 cm/s MV E velocity: 117.00 cm/s MV A velocity: 101.00 cm/s MV E/A ratio:  1.16 Kate Sable MD Electronically signed by Kate Sable MD Signature Date/Time:  05/15/2019/3:10:20  PM    Final         Scheduled Meds:  amiodarone  100 mg Oral Daily   ferrous sulfate  325 mg Oral BID WC   [START ON 05/18/2019] furosemide  60 mg Oral BID   levothyroxine  100 mcg Oral QAC breakfast   pravastatin  80 mg Oral q1800   sodium chloride flush  3 mL Intravenous Q12H   Continuous Infusions:  sodium chloride     heparin 1,000 Units/hr (05/17/19 0500)     LOS: 2 days    Time spent: 30 minutes    Teya Otterson Darleen Crocker, DO [facility]  If 7PM-7AM, please contact night-coverage [email or web address] 05/17/2019, 10:20 AM

## 2019-05-18 LAB — CBC
HCT: 38.2 % (ref 36.0–46.0)
Hemoglobin: 12.1 g/dL (ref 12.0–15.0)
MCH: 29.7 pg (ref 26.0–34.0)
MCHC: 31.7 g/dL (ref 30.0–36.0)
MCV: 93.9 fL (ref 80.0–100.0)
Platelets: 133 10*3/uL — ABNORMAL LOW (ref 150–400)
RBC: 4.07 MIL/uL (ref 3.87–5.11)
RDW: 14.1 % (ref 11.5–15.5)
WBC: 4.4 10*3/uL (ref 4.0–10.5)
nRBC: 0 % (ref 0.0–0.2)

## 2019-05-18 LAB — BASIC METABOLIC PANEL
Anion gap: 10 (ref 5–15)
BUN: 20 mg/dL (ref 8–23)
CO2: 26 mmol/L (ref 22–32)
Calcium: 9 mg/dL (ref 8.9–10.3)
Chloride: 104 mmol/L (ref 98–111)
Creatinine, Ser: 0.95 mg/dL (ref 0.44–1.00)
GFR calc Af Amer: >60 mL/min (ref >60)
GFR calc non Af Amer: 55 mL/min — ABNORMAL LOW (ref >60)
Glucose, Bld: 127 mg/dL — ABNORMAL HIGH (ref 70–99)
Potassium: 4 mmol/L (ref 3.5–5.1)
Sodium: 140 mmol/L (ref 135–145)

## 2019-05-18 LAB — HEPARIN LEVEL (UNFRACTIONATED): Heparin Unfractionated: 0.76 IU/mL — ABNORMAL HIGH (ref 0.30–0.70)

## 2019-05-18 LAB — APTT: aPTT: 85 seconds — ABNORMAL HIGH (ref 24–36)

## 2019-05-18 LAB — MAGNESIUM: Magnesium: 2.6 mg/dL — ABNORMAL HIGH (ref 1.7–2.4)

## 2019-05-18 NOTE — Progress Notes (Signed)
PROGRESS NOTE    Amanda Sellers  [id number] DOB: 1932/12/24 DOA: 05/15/2019 PCP: Tammi Sou, MD   Brief Narrative:  Per HPI: Amanda Sellers a 86 y.o.femalewith medical history significant forhypertension, hypothyroidism, CKD stage III, atrial fibrillation on Eliquis, COPD on intermittent home oxygen, OSA with obesity on CPAP, non-Hodgkin's lymphoma, anemia of chronic disease, dyslipidemia, and diastolic heart failure who presented to the ED with acute onset of shortness of breath that began at approximately 4 AM. Her pulse oximetry at home was approximately 70% and once EMS arrived they had placed her on nonrebreather mask and she was still noted to have saturations in the mid 80th percentile. She had denied any chest pain initially, but now claims that she has had some intermittent substernal chest pain with radiation to her left arm over the last couple weeks. Her legs have demonstrated swelling and she does not check her weight regularly. She claims to be taking her home Lasix 60 mg twice daily and has had good urine output.  4/23: Patient appears to be diuresing quite well with approximately 1.6 L of diuresis overnight on the Lasix. Lower extremity edema appears to be improving as well. Continue IV Lasix for ongoing diuresis and wean oxygen as tolerated. Provide bedside commode to assist with urination. Appreciate cardiology recommendations with anticipated transfer to [facility] on 4/26 for cardiac catheterization.  4/24: Diuresis appears to have slowed and patient appears euvolemic today.  Will hold further Lasix as she appears to have diuresed to her renal tolerance.  She did receive 60 mg of IV Lasix this morning.  Recheck a.m. labs and anticipate transfer to [facility] tomorrow in anticipation for cardiac catheterization on 4/26.  4/25: Patient appears euvolemic and creatinine levels have stabilized to her usual baseline.  Resume home dose of oral Lasix 60 mg  twice daily and maintain on fluid restriction.  She slept well overnight and is no longer orthopneic.  She is on room air.  Discussed case with Dr. Lovena Le at [facility] who will plan to keep her on the schedule for cardiac catheterization in a.m.  We will keep n.p.o. after midnight.  Assessment & Plan:   Active Problems:   Acute hypoxemic respiratory failure (HCC)   Acute hypoxemic respiratory failure secondary to acute on chronic diastolic congestive heart failure decompensation-resolved -Patient now on room air -Continue on home dose Lasix 60 mg twice daily -Fluid restriction to continue -2D echocardiogramperformed with diminished LVEF of 35-40% noted  Elevated troponin -Appreciate cardiology recommendations, continue telemetry monitoring -Remain on IV heparin -Plan for heart catheterization at [facility] on 4/26, discussed with Dr. Lovena Le who is aware  Mild hypokalemia-repleted -Order supplementation and follow with ongoing diuresis  Paroxysmal atrial fibrillation -Currently in sinus rhythm -Hold Eliquis for now and maintain on heparin drip for anticoagulation -Continue amiodarone  Hypertension -Monitor blood pressures carefully and hold valsartan for now with aggressive IV diuresis  CKD stage IIIa -Current creatinine of 0.9 which appears to be near baseline of 0.8-1 -Continue to follow repeat labs with ongoing diuresis  Hypothyroidism -Continue levothyroxine -Recent TSH 02/03/2019 of 3.41  Dyslipidemia -Continue statin  COPD on intermittent home oxygen -No bronchospasms currently noted -We will order breathing treatments as needed  Non-Hodgkin's B-cell lymphoma -Follows with Dr.Sherrill, Oncology  Obesity/OSA -CPAP at night   DVT prophylaxis:Heparin drip Code Status:Full code Family Communication:Daughter at bedside on 4/23 Disposition Plan:  Continue home Lasix dose and monitor carefully with a.m. labs.  N.p.o. after midnight with  anticipated transfer to [facility] for cardiac catheterization in the morning.  Maintain on heparin drip.   Consultants:  Cardiology  Procedures:  2D echocardiogram 4/22 with LVEF 35-40%  Antimicrobials:   None  Subjective: Patient seen and evaluated today with no new acute complaints or concerns. No acute concerns or events noted overnight.  She states that she is feeling well and has no further episodes of chest pain or shortness of breath.  Objective: Vitals:   05/17/19 1424 05/17/19 2056 05/18/19 0453 05/18/19 0453  BP: 131/71 (!) 147/61 129/65 129/65  Pulse: 71 78 77 75  Resp: 16 18 16 16   Temp: 98.4 F (36.9 C) 98.7 F (37.1 C) 98.3 F (36.8 C) 98.3 F (36.8 C)  TempSrc: Oral Oral Oral Oral  SpO2: 95% 94% 95% 94%  Weight:      Height:        Intake/Output Summary (Last 24 hours) at 05/18/2019 1049 Last data filed at 05/17/2019 1826 Gross per 24 hour  Intake 480 ml  Output --  Net 480 ml   Filed Weights   05/15/19 0453  Weight: 95.5 kg    Examination:  General exam: Appears calm and comfortable  Respiratory system: Clear to auscultation. Respiratory effort normal.  Currently on room air. Cardiovascular system: S1 & S2 heard, RRR. No JVD, murmurs, rubs, gallops or clicks. No pedal edema. Gastrointestinal system: Abdomen is nondistended, soft and nontender. No organomegaly or masses felt. Normal bowel sounds heard. Central nervous system: Alert and oriented. No focal neurological deficits. Extremities: Symmetric 5 x 5 power. Skin: No rashes, lesions or ulcers Psychiatry: Judgement and insight appear normal. Mood & affect appropriate.     Data Reviewed: I have personally reviewed following labs and imaging studies  CBC: Recent Labs  Lab 05/15/19 0450 05/16/19 0445 05/17/19 0501 05/18/19 0613  WBC 5.2 3.8* 4.4 4.4  NEUTROABS 2.5  --   --   --   HGB 13.7 12.2 12.0 12.1  HCT 44.9 39.8 38.3 38.2  MCV 94.9 94.1 93.4 93.9  PLT 141* 133* 127*  [id number]*   Basic Metabolic Panel: Recent Labs  Lab 05/15/19 0450 05/16/19 0445 05/17/19 0501 05/18/19 0613  NA 141 141 142 140  K 3.4* 3.9 4.1 4.0  CL 99 101 101 104  CO2 30 28 29 26   GLUCOSE 175* 131* 128* 127*  BUN 21 16 19 20   CREATININE 1.01* 0.87 1.07* 0.95  CALCIUM 8.7* 8.8* 9.3 9.0  MG 2.6* 2.5* 2.5* 2.6*   GFR: Estimated Creatinine Clearance: 51.4 mL/min (by C-G formula based on SCr of 0.95 mg/dL). Liver Function Tests: Recent Labs  Lab 05/15/19 0450  AST 53*  ALT 49*  ALKPHOS 52  BILITOT 0.8  PROT 7.0  ALBUMIN 4.3   No results for input(s): LIPASE, AMYLASE in the last 168 hours. No results for input(s): AMMONIA in the last 168 hours. Coagulation Profile: Recent Labs  Lab 05/15/19 1253  INR 1.1   Cardiac Enzymes: No results for input(s): CKTOTAL, CKMB, CKMBINDEX, TROPONINI in the last 168 hours. BNP (last 3 results) No results for input(s): PROBNP in the last 8760 hours. HbA1C: No results for input(s): HGBA1C in the last 72 hours. CBG: No results for input(s): GLUCAP in the last 168 hours. Lipid Profile: No results for input(s): CHOL, HDL, LDLCALC, TRIG, CHOLHDL, LDLDIRECT in the last 72 hours. Thyroid Function Tests: No results for input(s): TSH, T4TOTAL, FREET4, T3FREE, THYROIDAB in the last 72 hours. Anemia Panel: No results for input(s): VITAMINB12,  FOLATE, FERRITIN, TIBC, IRON, RETICCTPCT in the last 72 hours. Sepsis Labs: No results for input(s): PROCALCITON, LATICACIDVEN in the last 168 hours.  Recent Results (from the past 240 hour(s))  Respiratory Panel by RT PCR (Flu A&B, Covid) - Nasopharyngeal Swab     Status: None   Collection Time: 05/15/19  4:55 AM   Specimen: Nasopharyngeal Swab  Result Value Ref Range Status   SARS Coronavirus 2 by RT PCR NEGATIVE NEGATIVE Final    Comment: (NOTE) SARS-CoV-2 target nucleic acids are NOT DETECTED. The SARS-CoV-2 RNA is generally detectable in upper respiratoy specimens during the acute phase of  infection. The lowest concentration of SARS-CoV-2 viral copies this assay can detect is 131 copies/mL. A negative result does not preclude SARS-Cov-2 infection and should not be used as the sole basis for treatment or other patient management decisions. A negative result may occur with  improper specimen collection/handling, submission of specimen other than nasopharyngeal swab, presence of viral mutation(s) within the areas targeted by this assay, and inadequate number of viral copies (<131 copies/mL). A negative result must be combined with clinical observations, patient history, and epidemiological information. The expected result is Negative. Fact Sheet for Patients:  PinkCheek.be Fact Sheet for Healthcare Providers:  GravelBags.it This test is not yet ap proved or cleared by the [place] FDA and  has been authorized for detection and/or diagnosis of SARS-CoV-2 by FDA under an Emergency Use Authorization (EUA). This EUA will remain  in effect (meaning this test can be used) for the duration of the COVID-19 declaration under Section 564(b)(1) of the Act, 21 U.S.C. section 360bbb-3(b)(1), unless the authorization is terminated or revoked sooner.    Influenza A by PCR NEGATIVE NEGATIVE Final   Influenza B by PCR NEGATIVE NEGATIVE Final    Comment: (NOTE) The Xpert Xpress SARS-CoV-2/FLU/RSV assay is intended as an aid in  the diagnosis of influenza from Nasopharyngeal swab specimens and  should not be used as a sole basis for treatment. Nasal washings and  aspirates are unacceptable for Xpert Xpress SARS-CoV-2/FLU/RSV  testing. Fact Sheet for Patients: PinkCheek.be Fact Sheet for Healthcare Providers: GravelBags.it This test is not yet approved or cleared by the [place] FDA and  has been authorized for detection and/or diagnosis of SARS-CoV-2 by  FDA under  an Emergency Use Authorization (EUA). This EUA will remain  in effect (meaning this test can be used) for the duration of the  Covid-19 declaration under Section 564(b)(1) of the Act, 21  U.S.C. section 360bbb-3(b)(1), unless the authorization is  terminated or revoked. Performed at [facility], [address] [place], [place] [address]          Radiology Studies: No results found.      Scheduled Meds: . amiodarone  100 mg Oral Daily  . ferrous sulfate  325 mg Oral BID WC  . furosemide  60 mg Oral BID  . levothyroxine  100 mcg Oral QAC breakfast  . pravastatin  80 mg Oral q1800  . sodium chloride flush  3 mL Intravenous Q12H   Continuous Infusions: . sodium chloride    . heparin 1,000 Units/hr (05/17/19 1056)     LOS: 3 days    Time spent: 35 minutes    Deke Tilghman D Manuella Ghazi, DO [facility]  If 7PM-7AM, please contact night-coverage [email or web address] 05/18/2019, 10:49 AM

## 2019-05-18 NOTE — Progress Notes (Signed)
ANTICOAGULATION CONSULT NOTE - Initial Consult  Pharmacy Consult for heparin Indication: chest pain/ACS  Allergies  Allergen Reactions  . Iodinated Diagnostic Agents Rash and Other (See Comments)    CT Contrast    Patient Measurements: Height: 5\' 6"  (167.6 cm) Weight: 95.5 kg (210 lb 8.6 oz) IBW/kg (Calculated) : 59.3 Heparin Dosing Weight: 80.5 kg  Vital Signs: Temp: 98.3 F (36.8 C) (04/25 0453) Temp Source: Oral (04/25 0453) BP: 129/65 (04/25 0453) Pulse Rate: 75 (04/25 0453)  Labs: Recent Labs     0000 05/15/19 1148 05/15/19 1246 05/15/19 1253 05/15/19 1256 05/15/19 2050 05/16/19 0445 05/16/19 0445 05/17/19 0500 05/17/19 0501 05/18/19 0613  HGB  --   --   --   --   --   --  12.2   < >  --  12.0 12.1  HCT  --   --   --   --   --   --  39.8  --   --  38.3 38.2  PLT  --   --   --   --   --   --  133*  --   --  127* 133*  APTT  --   --   --  29  --    < > 93*  --   --  89* 85*  LABPROT  --   --   --  13.8  --   --   --   --   --   --   --   INR  --   --   --  1.1  --   --   --   --   --   --   --   HEPARINUNFRC   < >  --    < >  --   --   --  1.46*  --  0.74*  --  0.76*  CREATININE  --   --   --   --   --   --  0.87  --   --  1.07* 0.95  TROPONINIHS  --  352*  --   --  319*  --   --   --   --   --   --    < > = values in this interval not displayed.    Estimated Creatinine Clearance: 51.4 mL/min (by C-G formula based on SCr of 0.95 mg/dL).   Assessment: Pharmacy consulted to dose heparin in patient with ACS/STEMI.  Patient on Eliquis prior to admission with last dose given 4/21 0500.  Will need to monitor based on aPTT until correlation with heparin levels.  Trop high sens: [id number]  05/18/19  0930 update: aPTT: 85 seconds, within goal therapeutic range on heparin at 1000 units/hr Heparin level: 0.76 IU/mL--> almost  correlating with aPTT  CBC: H/H: 12.1/38.2    Plates: 141>133>127>133 RN reports no bleeding complications or issues with infusion  site   Goal of Therapy:  Heparin level 0.3-0.7 units/ml aPTT 66-102 seconds Monitor platelets by anticoagulation protocol: Yes   Plan:   Continue  heparin infusion at 1000 units/hr Check anti-Xa level and aPTT  daily while on heparin until correlation Continue to monitor H&H and platelets Monitor for signs and symptoms of bleeding   Despina Pole, Pharm. D. Clinical Pharmacist 05/18/2019 9:26 AM

## 2019-05-19 ENCOUNTER — Other Ambulatory Visit: Payer: Self-pay

## 2019-05-19 ENCOUNTER — Encounter (HOSPITAL_COMMUNITY)
Admission: EM | Disposition: A | Discharge status: Home / Self Care | Payer: Self-pay | Source: Home / Self Care | Attending: Internal Medicine

## 2019-05-19 DIAGNOSIS — I509 Heart failure, unspecified: Secondary | ICD-10-CM

## 2019-05-19 DIAGNOSIS — I5043 Acute on chronic combined systolic (congestive) and diastolic (congestive) heart failure: Secondary | ICD-10-CM | POA: Diagnosis present

## 2019-05-19 DIAGNOSIS — I420 Dilated cardiomyopathy: Secondary | ICD-10-CM

## 2019-05-19 DIAGNOSIS — I25119 Atherosclerotic heart disease of native coronary artery with unspecified angina pectoris: Secondary | ICD-10-CM

## 2019-05-19 HISTORY — PX: RIGHT/LEFT HEART CATH AND CORONARY ANGIOGRAPHY: CATH118266

## 2019-05-19 LAB — CBC
HCT: 35.9 % — ABNORMAL LOW (ref 36.0–46.0)
Hemoglobin: 11.3 g/dL — ABNORMAL LOW (ref 12.0–15.0)
MCH: 29.4 pg (ref 26.0–34.0)
MCHC: 31.5 g/dL (ref 30.0–36.0)
MCV: 93.5 fL (ref 80.0–100.0)
Platelets: 132 10*3/uL — ABNORMAL LOW (ref 150–400)
RBC: 3.84 MIL/uL — ABNORMAL LOW (ref 3.87–5.11)
RDW: 14.2 % (ref 11.5–15.5)
WBC: 4.2 10*3/uL (ref 4.0–10.5)
nRBC: 0 % (ref 0.0–0.2)

## 2019-05-19 LAB — POCT I-STAT EG7
Acid-Base Excess: 4 mmol/L — ABNORMAL HIGH (ref 0.0–2.0)
Acid-Base Excess: 5 mmol/L — ABNORMAL HIGH (ref 0.0–2.0)
Bicarbonate: 29.3 mmol/L — ABNORMAL HIGH (ref 20.0–28.0)
Bicarbonate: 30.8 mmol/L — ABNORMAL HIGH (ref 20.0–28.0)
Calcium, Ion: 1.17 mmol/L (ref 1.15–1.40)
Calcium, Ion: 1.17 mmol/L (ref 1.15–1.40)
HCT: 39 % (ref 36.0–46.0)
HCT: 39 % (ref 36.0–46.0)
Hemoglobin: 13.3 g/dL (ref 12.0–15.0)
Hemoglobin: 13.3 g/dL (ref 12.0–15.0)
O2 Saturation: 73 %
O2 Saturation: 74 %
Potassium: 3.7 mmol/L (ref 3.5–5.1)
Potassium: 3.7 mmol/L (ref 3.5–5.1)
Sodium: 141 mmol/L (ref 135–145)
Sodium: 141 mmol/L (ref 135–145)
TCO2: 31 mmol/L (ref 22–32)
TCO2: 32 mmol/L (ref 22–32)
pCO2, Ven: 46.3 mmHg (ref 44.0–60.0)
pCO2, Ven: 47.8 mmHg (ref 44.0–60.0)
pH, Ven: 7.41 (ref 7.250–7.430)
pH, Ven: 7.417 (ref 7.250–7.430)
pO2, Ven: 39 mmHg (ref 32.0–45.0)
pO2, Ven: 40 mmHg (ref 32.0–45.0)

## 2019-05-19 LAB — POCT I-STAT 7, (LYTES, BLD GAS, ICA,H+H)
Acid-Base Excess: 4 mmol/L — ABNORMAL HIGH (ref 0.0–2.0)
Bicarbonate: 28.8 mmol/L — ABNORMAL HIGH (ref 20.0–28.0)
Calcium, Ion: 1.16 mmol/L (ref 1.15–1.40)
HCT: 38 % (ref 36.0–46.0)
Hemoglobin: 12.9 g/dL (ref 12.0–15.0)
O2 Saturation: 98 %
Potassium: 3.6 mmol/L (ref 3.5–5.1)
Sodium: 141 mmol/L (ref 135–145)
TCO2: 30 mmol/L (ref 22–32)
pCO2 arterial: 42.5 mmHg (ref 32.0–48.0)
pH, Arterial: 7.44 (ref 7.350–7.450)
pO2, Arterial: 103 mmHg (ref 83.0–108.0)

## 2019-05-19 LAB — HEPARIN LEVEL (UNFRACTIONATED): Heparin Unfractionated: 0.68 IU/mL (ref 0.30–0.70)

## 2019-05-19 LAB — APTT: aPTT: 85 seconds — ABNORMAL HIGH (ref 24–36)

## 2019-05-19 SURGERY — RIGHT/LEFT HEART CATH AND CORONARY ANGIOGRAPHY
Anesthesia: LOCAL

## 2019-05-19 MED ORDER — LABETALOL HCL 5 MG/ML IV SOLN: 10.0000 mg | INTRAVENOUS | Status: AC | PRN

## 2019-05-19 MED ORDER — METHYLPREDNISOLONE SODIUM SUCC 125 MG IJ SOLR
INTRAMUSCULAR | Status: AC
  Filled 2019-05-19: qty 2

## 2019-05-19 MED ORDER — LOSARTAN POTASSIUM 25 MG PO TABS
12.5000 mg | ORAL_TABLET | Freq: Every day | ORAL | Status: DC
  Administered 2019-05-19 – 2019-05-20 (×2): 12.5 mg via ORAL
  Filled 2019-05-19 (×2): qty 1

## 2019-05-19 MED ORDER — HYDRALAZINE HCL 20 MG/ML IJ SOLN: 10.0000 mg | INTRAMUSCULAR | Status: AC | PRN

## 2019-05-19 MED ORDER — MIDAZOLAM HCL 2 MG/2ML IJ SOLN
INTRAMUSCULAR | Status: AC
  Filled 2019-05-19: qty 2

## 2019-05-19 MED ORDER — HEPARIN SODIUM (PORCINE) 1000 UNIT/ML IJ SOLN
INTRAMUSCULAR | Status: AC
  Filled 2019-05-19: qty 1

## 2019-05-19 MED ORDER — METHYLPREDNISOLONE SODIUM SUCC 125 MG IJ SOLR: 125.0000 mg | Freq: Once | INTRAMUSCULAR | Status: DC

## 2019-05-19 MED ORDER — DIPHENHYDRAMINE HCL 50 MG/ML IJ SOLN
INTRAMUSCULAR | Status: DC | PRN
  Administered 2019-05-19: 25 mg via INTRAVENOUS

## 2019-05-19 MED ORDER — FENTANYL CITRATE (PF) 100 MCG/2ML IJ SOLN
INTRAMUSCULAR | Status: AC
  Filled 2019-05-19: qty 2

## 2019-05-19 MED ORDER — ACETAMINOPHEN 325 MG PO TABS: 650.0000 mg | ORAL_TABLET | ORAL | Status: DC | PRN

## 2019-05-19 MED ORDER — ASPIRIN 81 MG PO CHEW
81.0000 mg | CHEWABLE_TABLET | ORAL | Status: AC
  Administered 2019-05-19: 12:00:00 81 mg via ORAL
  Filled 2019-05-19: qty 1

## 2019-05-19 MED ORDER — MIDAZOLAM HCL 2 MG/2ML IJ SOLN
INTRAMUSCULAR | Status: DC | PRN
  Administered 2019-05-19 (×2): 1 mg via INTRAVENOUS

## 2019-05-19 MED ORDER — ONDANSETRON HCL 4 MG/2ML IJ SOLN: 4.0000 mg | Freq: Four times a day (QID) | INTRAMUSCULAR | Status: DC | PRN

## 2019-05-19 MED ORDER — AMLODIPINE BESYLATE 5 MG PO TABS
5.0000 mg | ORAL_TABLET | Freq: Every day | ORAL | Status: DC
  Administered 2019-05-19 – 2019-05-21 (×3): 5 mg via ORAL
  Filled 2019-05-19 (×3): qty 1

## 2019-05-19 MED ORDER — HEPARIN SODIUM (PORCINE) 1000 UNIT/ML IJ SOLN
INTRAMUSCULAR | Status: DC | PRN
  Administered 2019-05-19: 5000 [IU] via INTRAVENOUS

## 2019-05-19 MED ORDER — METHYLPREDNISOLONE SODIUM SUCC 125 MG IJ SOLR
INTRAMUSCULAR | Status: DC | PRN
  Administered 2019-05-19: 125 mg via INTRAVENOUS

## 2019-05-19 MED ORDER — SODIUM CHLORIDE 0.9 % IV SOLN: 250.0000 mL | INTRAVENOUS | Status: DC | PRN

## 2019-05-19 MED ORDER — VERAPAMIL HCL 2.5 MG/ML IV SOLN
INTRAVENOUS | Status: AC
  Filled 2019-05-19: qty 2

## 2019-05-19 MED ORDER — SODIUM CHLORIDE 0.9% FLUSH: 3.0000 mL | INTRAVENOUS | Status: DC | PRN

## 2019-05-19 MED ORDER — SODIUM CHLORIDE 0.9% FLUSH: 3.0000 mL | Freq: Two times a day (BID) | INTRAVENOUS | Status: DC

## 2019-05-19 MED ORDER — HEPARIN (PORCINE) IN NACL 1000-0.9 UT/500ML-% IV SOLN
INTRAVENOUS | Status: DC | PRN
  Administered 2019-05-19 (×2): 500 mL

## 2019-05-19 MED ORDER — DIPHENHYDRAMINE HCL 50 MG/ML IJ SOLN: 12.5000 mg | Freq: Once | INTRAMUSCULAR | Status: DC

## 2019-05-19 MED ORDER — VERAPAMIL HCL 2.5 MG/ML IV SOLN
INTRAVENOUS | Status: DC | PRN
  Administered 2019-05-19: 5 mL via INTRA_ARTERIAL
  Administered 2019-05-19: 16:00:00 10 mL via INTRA_ARTERIAL

## 2019-05-19 MED ORDER — SODIUM CHLORIDE 0.9 % IV SOLN: INTRAVENOUS | Status: DC

## 2019-05-19 MED ORDER — DIAZEPAM 2 MG PO TABS: 2.0000 mg | ORAL_TABLET | Freq: Four times a day (QID) | ORAL | Status: DC | PRN

## 2019-05-19 MED ORDER — LIDOCAINE HCL (PF) 1 % IJ SOLN
INTRAMUSCULAR | Status: AC
  Filled 2019-05-19: qty 30

## 2019-05-19 MED ORDER — HEPARIN (PORCINE) IN NACL 1000-0.9 UT/500ML-% IV SOLN
INTRAVENOUS | Status: AC
  Filled 2019-05-19: qty 1000

## 2019-05-19 MED ORDER — FENTANYL CITRATE (PF) 100 MCG/2ML IJ SOLN
INTRAMUSCULAR | Status: DC | PRN
  Administered 2019-05-19 (×2): 25 ug via INTRAVENOUS

## 2019-05-19 MED ORDER — ASPIRIN 81 MG PO CHEW
81.0000 mg | CHEWABLE_TABLET | Freq: Every day | ORAL | Status: DC
  Administered 2019-05-20: 10:00:00 81 mg via ORAL
  Filled 2019-05-19 (×2): qty 1

## 2019-05-19 MED ORDER — IOHEXOL 350 MG/ML SOLN
INTRAVENOUS | Status: DC | PRN
  Administered 2019-05-19: 17:00:00 75 mL via INTRA_ARTERIAL

## 2019-05-19 MED ORDER — SODIUM CHLORIDE 0.9% FLUSH
3.0000 mL | Freq: Two times a day (BID) | INTRAVENOUS | Status: DC
  Administered 2019-05-19 – 2019-05-21 (×3): 3 mL via INTRAVENOUS

## 2019-05-19 MED ORDER — LIDOCAINE HCL (PF) 1 % IJ SOLN
INTRAMUSCULAR | Status: DC | PRN
  Administered 2019-05-19: 5 mL via INTRADERMAL
  Administered 2019-05-19: 30 mL via INTRADERMAL

## 2019-05-19 MED ORDER — SODIUM CHLORIDE 0.9 % IV SOLN
INTRAVENOUS | Status: AC | PRN
  Administered 2019-05-19: 75 mL/h via INTRAVENOUS

## 2019-05-19 MED ORDER — DIPHENHYDRAMINE HCL 50 MG/ML IJ SOLN
INTRAMUSCULAR | Status: AC
  Filled 2019-05-19: qty 1

## 2019-05-19 SURGICAL SUPPLY — 17 items
CATH INFINITI 5 FR AR1 MOD (CATHETERS) ×1 IMPLANT
CATH INFINITI JR4 5F (CATHETERS) ×1 IMPLANT
CATH LAUNCHER 5F NOTO (CATHETERS) IMPLANT
CATH OPTITORQUE TIG 4.0 5F (CATHETERS) ×1 IMPLANT
CATH SWAN GANZ 7F STRAIGHT (CATHETERS) ×1 IMPLANT
CATHETER LAUNCHER 5F NOTO (CATHETERS) ×2
CLOSURE MYNX CONTROL 6F/7F (Vascular Products) ×1 IMPLANT
DEVICE RAD COMP TR BAND LRG (VASCULAR PRODUCTS) ×1 IMPLANT
GLIDESHEATH SLEND SS 6F .021 (SHEATH) ×1 IMPLANT
GUIDEWIRE INQWIRE 1.5J.035X260 (WIRE) IMPLANT
INQWIRE 1.5J .035X260CM (WIRE) ×2
KIT HEART LEFT (KITS) ×2 IMPLANT
PACK CARDIAC CATHETERIZATION (CUSTOM PROCEDURE TRAY) ×2 IMPLANT
SHEATH PINNACLE 7F 10CM (SHEATH) ×1 IMPLANT
SHEATH PROBE COVER 6X72 (BAG) ×1 IMPLANT
TRANSDUCER W/STOPCOCK (MISCELLANEOUS) ×2 IMPLANT
TUBING CIL FLEX 10 FLL-RA (TUBING) ×2 IMPLANT

## 2019-05-19 NOTE — Progress Notes (Addendum)
Progress Note  Patient Name: Amanda Sellers Date of Encounter: 05/19/2019  Primary Cardiologist: Glenetta Hew, MD   Subjective   SOB much improved  Inpatient Medications    Scheduled Meds: . amiodarone  100 mg Oral Daily  . ferrous sulfate  325 mg Oral BID WC  . furosemide  60 mg Oral BID  . levothyroxine  100 mcg Oral QAC breakfast  . pravastatin  80 mg Oral q1800  . sodium chloride flush  3 mL Intravenous Q12H   Continuous Infusions: . sodium chloride    . heparin 1,000 Units/hr (05/18/19 1534)   PRN Meds: sodium chloride, acetaminophen **OR** acetaminophen, ALPRAZolam, ipratropium-albuterol, ondansetron **OR** ondansetron (ZOFRAN) IV, sodium chloride flush, traMADol   Vital Signs    Vitals:   05/18/19 1326 05/18/19 2206 05/19/19 0541 05/19/19 0934  BP:  137/65 140/61   Pulse:  78 66   Resp:  16 20   Temp:  97.8 F (36.6 C) 98.4 F (36.9 C)   TempSrc:  Oral    SpO2: 97% 96% 96% 95%  Weight:      Height:        Intake/Output Summary (Last 24 hours) at 05/19/2019 0935 Last data filed at 05/18/2019 1300 Gross per 24 hour  Intake 240 ml  Output --  Net 240 ml   Last 3 Weights 05/15/2019 04/18/2019 03/11/2019  Weight (lbs) 210 lb 8.6 oz 213 lb 4.8 oz 206 lb 9.6 oz  Weight (kg) 95.5 kg 96.752 kg 93.713 kg      Telemetry    SR- Personally Reviewed  ECG    n/a - Personally Reviewed  Physical Exam   GEN: No acute distress.   Neck: No JVD Cardiac: RRR, no murmurs, rubs, or gallops.  Respiratory:faint crackles bilaterla bases GI: Soft, nontender, non-distended  MS: 1+ bilateral LE edema; No deformity. Neuro:  Nonfocal  Psych: Normal affect   Labs    High Sensitivity Troponin:   Recent Labs  Lab 05/15/19 0450 05/15/19 0755 05/15/19 1148 05/15/19 1256  TROPONINIHS 17 165* 352* 319*      Chemistry Recent Labs  Lab 05/15/19 0450 05/15/19 0450 05/16/19 0445 05/17/19 0501 05/18/19 0613  NA 141   < > 141 142 140  K 3.4*   < > 3.9 4.1  4.0  CL 99   < > 101 101 104  CO2 30   < > 28 29 26   GLUCOSE 175*   < > 131* 128* 127*  BUN 21   < > 16 19 20   CREATININE 1.01*   < > 0.87 1.07* 0.95  CALCIUM 8.7*   < > 8.8* 9.3 9.0  PROT 7.0  --   --   --   --   ALBUMIN 4.3  --   --   --   --   AST 53*  --   --   --   --   ALT 49*  --   --   --   --   ALKPHOS 52  --   --   --   --   BILITOT 0.8  --   --   --   --   GFRNONAA 51*   < > >60 48* 55*  GFRAA 59*   < > >60 55* >60  ANIONGAP 12   < > 12 12 10    < > = values in this interval not displayed.     Hematology Recent Labs  Lab 05/17/19 0501 05/18/19 [id number] 05/19/19  0416  WBC 4.4 4.4 4.2  RBC 4.10 4.07 3.84*  HGB 12.0 12.1 11.3*  HCT 38.3 38.2 35.9*  MCV 93.4 93.9 93.5  MCH 29.3 29.7 29.4  MCHC 31.3 31.7 31.5  RDW 13.8 14.1 14.2  PLT 127* 133* 132*    BNP Recent Labs  Lab 05/15/19 0450  BNP 138.0*     DDimer No results for input(s): DDIMER in the last 168 hours.   Radiology    No results found.  Cardiac Studies   Echo 05/15/19:  1. Left ventricular ejection fraction, by estimation, is 35 to 40%. The  left ventricle has moderately decreased function. The left ventricle  demonstrates global hypokinesis. There is mild concentric left ventricular  hypertrophy. Left ventricular  diastolic parameters are consistent with Grade II diastolic dysfunction  (pseudonormalization). Elevated left ventricular end-diastolic pressure.  2. Right ventricular systolic function is low normal. The right  ventricular size is normal.  3. Left atrial size was mildly dilated.  4. The mitral valve is degenerative. Mild mitral valve regurgitation.  5. The aortic valve is tricuspid. Aortic valve regurgitation is trivial.  No aortic stenosis is present.   Patient Profile     86 y.o. female a hx of paroxysmal atrial fibrillation and chronic diastolic heart failurewho is being seen today for the evaluation of CHF and troponin elevationat the request of Dr.  Manuella Ghazi.   Assessment & Plan    1. Acute combined sysotlic/diatolic HF - [id number] echo LVEF 35-40%, grade II DDx, low normal RV function, mild MR - I/Os are incomplete this admission. Weights are labile and appear inaccurate. Mild fluctuaions in Cr without clear trend.She is on oral lasix 60mg  bid. Subjectively she reports symptoms much improved. Still some fluid overload on exam. Will need to f/u RHC filling pressures, continue current diuretic.   - ARB held on admit it appears. Start back lower dose losartan 12.5mg  daily and follow renal function with diuresis. May transition to entresto prior to discharge or at outpatient f/u.Beta blocker not started as of yet due to acute HF, awaiting until more euvolemic   2. Elevated troponin - peak 352, trending down - echo LVEF 35-40% - plans for LHC/RHC today  - medical therapy with hep gtt, pravastatin 80, starting losartan 12.5mg  daily. - cath for today   3. PAF - eliquis on hold for cath - on amio chronically as outpatient  4. Non-Hodgkin's lymphoma: This is in remission.She was initially diagnosed in July 2016 and underwent 4 cycles of chemotherapy with bendamustine and rituximab. She last saw Dr. Benay Spice on 04/18/2019.   I have reviewed the risks, indications, and alternatives to cardiac catheterization, possible angioplasty, and stenting with the patient  today. Risks include but are not limited to bleeding, infection, vascular injury, stroke, myocardial infection, arrhythmia, kidney injury, radiation-related injury in the case of prolonged fluoroscopy use, emergency cardiac surgery, and death. The patient understands the risks of serious complication is 1-2 in [id number] with diagnostic cardiac cath and 1-2% or less with angioplasty/stenting.     For questions or updates, please contact [place] Please consult [email or web address] for contact info under        Signed, Carlyle Dolly, MD  05/19/2019, 9:35 AM

## 2019-05-19 NOTE — Progress Notes (Signed)
PROGRESS NOTE    Amanda Sellers  [id number] DOB: Nov 21, 1932 DOA: 05/15/2019 PCP: Tammi Sou, MD   Brief Narrative:  Per HPI: Amanda Sellers a 86 y.o.femalewith medical history significant forhypertension, hypothyroidism, CKD stage III, atrial fibrillation on Eliquis, COPD on intermittent home oxygen, OSA with obesity on CPAP, non-Hodgkin's lymphoma, anemia of chronic disease, dyslipidemia, and diastolic heart failure who presented to the ED with acute onset of shortness of breath that began at approximately 4 AM. Her pulse oximetry at home was approximately 70% and once EMS arrived they had placed her on nonrebreather mask and she was still noted to have saturations in the mid 80th percentile. She had denied any chest pain initially, but now claims that she has had some intermittent substernal chest pain with radiation to her left arm over the last couple weeks. Her legs have demonstrated swelling and she does not check her weight regularly. She claims to be taking her home Lasix 60 mg twice daily and has had good urine output.  4/23: Patient appears to be diuresing quite well with approximately 1.6 L of diuresis overnight on the Lasix. Lower extremity edema appears to be improving as well. Continue IV Lasix for ongoing diuresis and wean oxygen as tolerated. Provide bedside commode to assist with urination. Appreciate cardiology recommendations with anticipated transfer to [facility] on 4/26 for cardiac catheterization.  4/24:Diuresis appears to have slowed and patient appears euvolemic today. Will hold further Lasix as she appears to have diuresed to her renal tolerance. She did receive 60 mg of IV Lasix this morning. Recheck a.m. labs and anticipate transfer to [facility] tomorrow in anticipation for cardiac catheterization on 4/26.  4/25: Patient appears euvolemic and creatinine levels have stabilized to her usual baseline.  Resume home dose of oral Lasix 60 mg  twice daily and maintain on fluid restriction.  She slept well overnight and is no longer orthopneic.  She is on room air.  Discussed case with Dr. Lovena Le at [facility] who will plan to keep her on the schedule for cardiac catheterization in a.m.  We will keep n.p.o. after midnight.  4/26: Patient overall doing well and is to continue on oral Lasix as prescribed.  She has been started back at lower dose of losartan 12.5 mg daily by cardiology.  Awaiting heart catheterization.  She is to remain n.p.o.  Assessment & Plan:   Active Problems:   Acute hypoxemic respiratory failure (HCC)   Acute hypoxemic respiratory failure secondary to acute on chronic diastolic congestive heart failure decompensation-resolved -Patient on room air -Continue on home dose Lasix 60 mg twice daily -Fluid restrictionto continue -2D echocardiogramperformed with diminished LVEF of 35-40% noted  Elevated troponin -Appreciate cardiology recommendations, continue telemetry monitoring -Remain on IV heparin -Continue pravastatin 80 mg daily and restarted on losartan 12.5 mg daily per cardiology -Plan for heart catheterization at [facility] this afternoon, discussed with Dr. Lovena Le who is aware  Mild hypokalemia-repleted -Order supplementation and follow with ongoing diuresis  Paroxysmal atrial fibrillation -Currently in sinus rhythm -Hold Eliquis for now and maintain on heparin drip for anticoagulation -Continue amiodarone  Hypertension -Monitor blood pressures carefully and hold valsartan for now with aggressive IV diuresis  CKD stage IIIa -Current creatinine of 0.9which appears to be near baseline of 0.8-1 -Continue to follow repeat labs with ongoing diuresis  Hypothyroidism -Continue levothyroxine -Recent TSH 02/03/2019 of 3.41  Dyslipidemia -Continue statin as noted above  COPD on intermittent home oxygen -No bronchospasms currently noted -We  will order breathing treatments as  needed  Non-Hodgkin's B-cell lymphoma -Follows with Dr.Sherrill, Oncology  Obesity/OSA -CPAP at night   DVT prophylaxis:Heparin drip Code Status:Full code Family Communication:Daughter at [place] 4/23 Disposition Plan: Continue home Lasix dose and monitor carefully with a.m. labs.    Maintain n.p.o. in anticipation for cardiac catheterization today.  Discussed with cardiology, Dr. Harl Bowie.   Consultants:  Cardiology  Procedures:  2D echocardiogram 4/22 with LVEF 35-40%  Antimicrobials:   None  Subjective: Patient seen and evaluated today with no new acute complaints or concerns. No acute concerns or events noted overnight.  She is awaiting cardiac catheterization.  Objective: Vitals:   05/18/19 1326 05/18/19 2206 05/19/19 0541 05/19/19 0934  BP:  137/65 140/61   Pulse:  78 66   Resp:  16 20   Temp:  97.8 F (36.6 C) 98.4 F (36.9 C)   TempSrc:  Oral    SpO2: 97% 96% 96% 95%  Weight:      Height:        Intake/Output Summary (Last 24 hours) at 05/19/2019 1057 Last data filed at 05/18/2019 1300 Gross per 24 hour  Intake 240 ml  Output --  Net 240 ml   Filed Weights   05/15/19 0453  Weight: 95.5 kg    Examination:  General exam: Appears calm and comfortable  Respiratory system: Clear to auscultation. Respiratory effort normal. Cardiovascular system: S1 & S2 heard, RRR. No JVD, murmurs, rubs, gallops or clicks. No pedal edema. Gastrointestinal system: Abdomen is nondistended, soft and nontender. No organomegaly or masses felt. Normal bowel sounds heard. Central nervous system: Alert and oriented. No focal neurological deficits. Extremities: Symmetric 5 x 5 power. Skin: No rashes, lesions or ulcers Psychiatry: Judgement and insight appear normal. Mood & affect appropriate.     Data Reviewed: I have personally reviewed following labs and imaging studies  CBC: Recent Labs  Lab 05/15/19 0450 05/16/19 0445 05/17/19 0501  05/18/19 0613 05/19/19 0416  WBC 5.2 3.8* 4.4 4.4 4.2  NEUTROABS 2.5  --   --   --   --   HGB 13.7 12.2 12.0 12.1 11.3*  HCT 44.9 39.8 38.3 38.2 35.9*  MCV 94.9 94.1 93.4 93.9 93.5  PLT 141* 133* 127* 133* [id number]*   Basic Metabolic Panel: Recent Labs  Lab 05/15/19 0450 05/16/19 0445 05/17/19 0501 05/18/19 0613  NA 141 141 142 140  K 3.4* 3.9 4.1 4.0  CL 99 101 101 104  CO2 30 28 29 26   GLUCOSE 175* 131* 128* 127*  BUN 21 16 19 20   CREATININE 1.01* 0.87 1.07* 0.95  CALCIUM 8.7* 8.8* 9.3 9.0  MG 2.6* 2.5* 2.5* 2.6*   GFR: Estimated Creatinine Clearance: 51.4 mL/min (by C-G formula based on SCr of 0.95 mg/dL). Liver Function Tests: Recent Labs  Lab 05/15/19 0450  AST 53*  ALT 49*  ALKPHOS 52  BILITOT 0.8  PROT 7.0  ALBUMIN 4.3   No results for input(s): LIPASE, AMYLASE in the last 168 hours. No results for input(s): AMMONIA in the last 168 hours. Coagulation Profile: Recent Labs  Lab 05/15/19 1253  INR 1.1   Cardiac Enzymes: No results for input(s): CKTOTAL, CKMB, CKMBINDEX, TROPONINI in the last 168 hours. BNP (last 3 results) No results for input(s): PROBNP in the last 8760 hours. HbA1C: No results for input(s): HGBA1C in the last 72 hours. CBG: No results for input(s): GLUCAP in the last 168 hours. Lipid Profile: No results for input(s): CHOL, HDL, LDLCALC, TRIG, CHOLHDL,  LDLDIRECT in the last 72 hours. Thyroid Function Tests: No results for input(s): TSH, T4TOTAL, FREET4, T3FREE, THYROIDAB in the last 72 hours. Anemia Panel: No results for input(s): VITAMINB12, FOLATE, FERRITIN, TIBC, IRON, RETICCTPCT in the last 72 hours. Sepsis Labs: No results for input(s): PROCALCITON, LATICACIDVEN in the last 168 hours.  Recent Results (from the past 240 hour(s))  Respiratory Panel by RT PCR (Flu A&B, Covid) - Nasopharyngeal Swab     Status: None   Collection Time: 05/15/19  4:55 AM   Specimen: Nasopharyngeal Swab  Result Value Ref Range Status   SARS  Coronavirus 2 by RT PCR NEGATIVE NEGATIVE Final    Comment: (NOTE) SARS-CoV-2 target nucleic acids are NOT DETECTED. The SARS-CoV-2 RNA is generally detectable in upper respiratoy specimens during the acute phase of infection. The lowest concentration of SARS-CoV-2 viral copies this assay can detect is 131 copies/mL. A negative result does not preclude SARS-Cov-2 infection and should not be used as the sole basis for treatment or other patient management decisions. A negative result may occur with  improper specimen collection/handling, submission of specimen other than nasopharyngeal swab, presence of viral mutation(s) within the areas targeted by this assay, and inadequate number of viral copies (<131 copies/mL). A negative result must be combined with clinical observations, patient history, and epidemiological information. The expected result is Negative. Fact Sheet for Patients:  PinkCheek.be Fact Sheet for Healthcare Providers:  GravelBags.it This test is not yet ap proved or cleared by the [place] FDA and  has been authorized for detection and/or diagnosis of SARS-CoV-2 by FDA under an Emergency Use Authorization (EUA). This EUA will remain  in effect (meaning this test can be used) for the duration of the COVID-19 declaration under Section 564(b)(1) of the Act, 21 U.S.C. section 360bbb-3(b)(1), unless the authorization is terminated or revoked sooner.    Influenza A by PCR NEGATIVE NEGATIVE Final   Influenza B by PCR NEGATIVE NEGATIVE Final    Comment: (NOTE) The Xpert Xpress SARS-CoV-2/FLU/RSV assay is intended as an aid in  the diagnosis of influenza from Nasopharyngeal swab specimens and  should not be used as a sole basis for treatment. Nasal washings and  aspirates are unacceptable for Xpert Xpress SARS-CoV-2/FLU/RSV  testing. Fact Sheet for Patients: PinkCheek.be Fact Sheet  for Healthcare Providers: GravelBags.it This test is not yet approved or cleared by the [place] FDA and  has been authorized for detection and/or diagnosis of SARS-CoV-2 by  FDA under an Emergency Use Authorization (EUA). This EUA will remain  in effect (meaning this test can be used) for the duration of the  Covid-19 declaration under Section 564(b)(1) of the Act, 21  U.S.C. section 360bbb-3(b)(1), unless the authorization is  terminated or revoked. Performed at [facility], [address] [place], [place] [address]          Radiology Studies: No results found.      Scheduled Meds: . amiodarone  100 mg Oral Daily  . aspirin  81 mg Oral Pre-Cath  . ferrous sulfate  325 mg Oral BID WC  . furosemide  60 mg Oral BID  . levothyroxine  100 mcg Oral QAC breakfast  . losartan  12.5 mg Oral Daily  . pravastatin  80 mg Oral q1800  . sodium chloride flush  3 mL Intravenous Q12H  . sodium chloride flush  3 mL Intravenous Q12H   Continuous Infusions: . sodium chloride    . sodium chloride    . sodium chloride    .  heparin 1,000 Units/hr (05/18/19 1534)     LOS: 4 days    Time spent: 30 minutes    Mckynlie Vanderslice Darleen Crocker, DO [facility]  If 7PM-7AM, please contact night-coverage [email or web address] 05/19/2019, 10:57 AM

## 2019-05-19 NOTE — Telephone Encounter (Signed)
Patients daughter called iin stating she was picking the patients medications up. And stated she was currently in the hospital. She is trying to get moms meds together so when she comes home. She needs a medication refill until next appt 06/10/19  levothyroxine (SYNTHROID) 100 MCG tablet   [id number] - [place], [place] - [address]

## 2019-05-19 NOTE — Progress Notes (Signed)
[facility] transported patient from unit to go to [facility]. Patient stable, report given to [facility]

## 2019-05-19 NOTE — Interval H&P Note (Signed)
Cath Lab Visit (complete for each Cath Lab visit)  Clinical Evaluation Leading to the Procedure:   ACS: No.  Non-ACS:    Anginal Classification: CCS III  Anti-ischemic medical therapy: No Therapy  Non-Invasive Test Results: No non-invasive testing performed  Prior CABG: No previous CABG      History and Physical Interval Note:  05/19/2019 3:04 PM  Amanda Sellers  has presented today for surgery, with the diagnosis of NSTEMI.  The various methods of treatment have been discussed with the patient and family. After consideration of risks, benefits and other options for treatment, the patient has consented to  Procedure(s): RIGHT/LEFT HEART CATH AND CORONARY ANGIOGRAPHY (N/A) as a surgical intervention.  The patient's history has been reviewed, patient examined, no change in status, stable for surgery.  I have reviewed the patient's chart and labs.  Questions were answered to the patient's satisfaction.     Shelva Majestic

## 2019-05-19 NOTE — Progress Notes (Addendum)
ANTICOAGULATION CONSULT NOTE - Initial Consult  Pharmacy Consult for heparin Indication: chest pain/ACS  Allergies  Allergen Reactions  . Iodinated Diagnostic Agents Rash and Other (See Comments)    CT Contrast    Patient Measurements: Height: 5\' 6"  (167.6 cm) Weight: 95.5 kg (210 lb 8.6 oz) IBW/kg (Calculated) : 59.3 Heparin Dosing Weight: 80.5 kg  Vital Signs: Temp: 98.4 F (36.9 C) (04/26 0541) Temp Source: Oral (04/25 2206) BP: 140/61 (04/26 0541) Pulse Rate: 66 (04/26 0541)  Labs: Recent Labs    05/17/19 0500 05/17/19 0501 05/17/19 0501 05/18/19 0613 05/19/19 0416  HGB  --  12.0   < > 12.1 11.3*  HCT  --  38.3  --  38.2 35.9*  PLT  --  127*  --  133* 132*  APTT  --  89*  --  85* 85*  HEPARINUNFRC 0.74*  --   --  0.76* 0.68  CREATININE  --  1.07*  --  0.95  --    < > = values in this interval not displayed.    Estimated Creatinine Clearance: 51.4 mL/min (by C-G formula based on SCr of 0.95 mg/dL).   Assessment: Pharmacy consulted to dose heparin in patient with ACS/STEMI.  Patient on Eliquis prior to admission with last dose given 4/21 0500.  Will need to monitor based on aPTT until correlation with heparin levels.  Trop high sens: [id number]  05/19/19  0830 update: aPTT: 85 seconds, within goal therapeutic range on heparin at 1000 units/hr Heparin level: 0.68 IU/mL-->  correlating with aPTT now CBC: H/H: 11.3//35.9    Plates: 141>133>127>133>132 RN reports no bleeding complications or issues with infusion site   Goal of Therapy:  Heparin level 0.3-0.7 units/ml aPTT 66-102 seconds Monitor platelets by anticoagulation protocol: Yes   Plan:  Stop aPTT monitoring, since HL now correlating with aPTT  Continue  heparin infusion at 1000 units/hr Check anti-Xa level   daily while on heparin until correlation Continue to monitor H&H and platelets Monitor for signs and symptoms of bleeding   Despina Pole, Pharm. D. Clinical Pharmacist 05/19/2019 8:24  AM

## 2019-05-19 NOTE — H&P (View-Only) (Signed)
Progress Note  Patient Name: Amanda Sellers Date of Encounter: 05/19/2019  Primary Cardiologist: Glenetta Hew, MD   Subjective   SOB much improved  Inpatient Medications    Scheduled Meds: . amiodarone  100 mg Oral Daily  . ferrous sulfate  325 mg Oral BID WC  . furosemide  60 mg Oral BID  . levothyroxine  100 mcg Oral QAC breakfast  . pravastatin  80 mg Oral q1800  . sodium chloride flush  3 mL Intravenous Q12H   Continuous Infusions: . sodium chloride    . heparin 1,000 Units/hr (05/18/19 1534)   PRN Meds: sodium chloride, acetaminophen **OR** acetaminophen, ALPRAZolam, ipratropium-albuterol, ondansetron **OR** ondansetron (ZOFRAN) IV, sodium chloride flush, traMADol   Vital Signs    Vitals:   05/18/19 1326 05/18/19 2206 05/19/19 0541 05/19/19 0934  BP:  137/65 140/61   Pulse:  78 66   Resp:  16 20   Temp:  97.8 F (36.6 C) 98.4 F (36.9 C)   TempSrc:  Oral    SpO2: 97% 96% 96% 95%  Weight:      Height:        Intake/Output Summary (Last 24 hours) at 05/19/2019 0935 Last data filed at 05/18/2019 1300 Gross per 24 hour  Intake 240 ml  Output --  Net 240 ml   Last 3 Weights 05/15/2019 04/18/2019 03/11/2019  Weight (lbs) 210 lb 8.6 oz 213 lb 4.8 oz 206 lb 9.6 oz  Weight (kg) 95.5 kg 96.752 kg 93.713 kg      Telemetry    SR- Personally Reviewed  ECG    n/a - Personally Reviewed  Physical Exam   GEN: No acute distress.   Neck: No JVD Cardiac: RRR, no murmurs, rubs, or gallops.  Respiratory:faint crackles bilaterla bases GI: Soft, nontender, non-distended  MS: 1+ bilateral LE edema; No deformity. Neuro:  Nonfocal  Psych: Normal affect   Labs    High Sensitivity Troponin:   Recent Labs  Lab 05/15/19 0450 05/15/19 0755 05/15/19 1148 05/15/19 1256  TROPONINIHS 17 165* 352* 319*      Chemistry Recent Labs  Lab 05/15/19 0450 05/15/19 0450 05/16/19 0445 05/17/19 0501 05/18/19 0613  NA 141   < > 141 142 140  K 3.4*   < > 3.9 4.1  4.0  CL 99   < > 101 101 104  CO2 30   < > 28 29 26   GLUCOSE 175*   < > 131* 128* 127*  BUN 21   < > 16 19 20   CREATININE 1.01*   < > 0.87 1.07* 0.95  CALCIUM 8.7*   < > 8.8* 9.3 9.0  PROT 7.0  --   --   --   --   ALBUMIN 4.3  --   --   --   --   AST 53*  --   --   --   --   ALT 49*  --   --   --   --   ALKPHOS 52  --   --   --   --   BILITOT 0.8  --   --   --   --   GFRNONAA 51*   < > >60 48* 55*  GFRAA 59*   < > >60 55* >60  ANIONGAP 12   < > 12 12 10    < > = values in this interval not displayed.     Hematology Recent Labs  Lab 05/17/19 0501 05/18/19 [id number] 05/19/19  0416  WBC 4.4 4.4 4.2  RBC 4.10 4.07 3.84*  HGB 12.0 12.1 11.3*  HCT 38.3 38.2 35.9*  MCV 93.4 93.9 93.5  MCH 29.3 29.7 29.4  MCHC 31.3 31.7 31.5  RDW 13.8 14.1 14.2  PLT 127* 133* 132*    BNP Recent Labs  Lab 05/15/19 0450  BNP 138.0*     DDimer No results for input(s): DDIMER in the last 168 hours.   Radiology    No results found.  Cardiac Studies   Echo 05/15/19:  1. Left ventricular ejection fraction, by estimation, is 35 to 40%. The  left ventricle has moderately decreased function. The left ventricle  demonstrates global hypokinesis. There is mild concentric left ventricular  hypertrophy. Left ventricular  diastolic parameters are consistent with Grade II diastolic dysfunction  (pseudonormalization). Elevated left ventricular end-diastolic pressure.  2. Right ventricular systolic function is low normal. The right  ventricular size is normal.  3. Left atrial size was mildly dilated.  4. The mitral valve is degenerative. Mild mitral valve regurgitation.  5. The aortic valve is tricuspid. Aortic valve regurgitation is trivial.  No aortic stenosis is present.   Patient Profile     86 y.o. female a hx of paroxysmal atrial fibrillation and chronic diastolic heart failurewho is being seen today for the evaluation of CHF and troponin elevationat the request of Dr.  Manuella Ghazi.   Assessment & Plan    1. Acute combined sysotlic/diatolic HF - [id number] echo LVEF 35-40%, grade II DDx, low normal RV function, mild MR - I/Os are incomplete this admission. Weights are labile and appear inaccurate. Mild fluctuaions in Cr without clear trend.She is on oral lasix 60mg  bid. Subjectively she reports symptoms much improved. Still some fluid overload on exam. Will need to f/u RHC filling pressures, continue current diuretic.   - ARB held on admit it appears. Start back lower dose losartan 12.5mg  daily and follow renal function with diuresis. May transition to entresto prior to discharge or at outpatient f/u.Beta blocker not started as of yet due to acute HF, awaiting until more euvolemic   2. Elevated troponin - peak 352, trending down - echo LVEF 35-40% - plans for LHC/RHC today  - medical therapy with hep gtt, pravastatin 80, starting losartan 12.5mg  daily. - cath for today   3. PAF - eliquis on hold for cath - on amio chronically as outpatient  4. Non-Hodgkin's lymphoma: This is in remission.She was initially diagnosed in July 2016 and underwent 4 cycles of chemotherapy with bendamustine and rituximab. She last saw Dr. Benay Spice on 04/18/2019.   I have reviewed the risks, indications, and alternatives to cardiac catheterization, possible angioplasty, and stenting with the patient  today. Risks include but are not limited to bleeding, infection, vascular injury, stroke, myocardial infection, arrhythmia, kidney injury, radiation-related injury in the case of prolonged fluoroscopy use, emergency cardiac surgery, and death. The patient understands the risks of serious complication is 1-2 in [id number] with diagnostic cardiac cath and 1-2% or less with angioplasty/stenting.     For questions or updates, please contact [place] Please consult [email or web address] for contact info under        Signed, Carlyle Dolly, MD  05/19/2019, 9:35 AM

## 2019-05-19 NOTE — Telephone Encounter (Signed)
RF request for Levothyroxine LOV:05/05/19, no show Next ov: 06/10/19 Last written:02/19/19(90,0)  Please advise. Medication pending

## 2019-05-20 DIAGNOSIS — I2510 Atherosclerotic heart disease of native coronary artery without angina pectoris: Secondary | ICD-10-CM

## 2019-05-20 DIAGNOSIS — I5043 Acute on chronic combined systolic (congestive) and diastolic (congestive) heart failure: Secondary | ICD-10-CM

## 2019-05-20 DIAGNOSIS — I25119 Atherosclerotic heart disease of native coronary artery with unspecified angina pectoris: Secondary | ICD-10-CM

## 2019-05-20 DIAGNOSIS — E876 Hypokalemia: Secondary | ICD-10-CM

## 2019-05-20 DIAGNOSIS — I272 Pulmonary hypertension, unspecified: Secondary | ICD-10-CM

## 2019-05-20 LAB — CBC
HCT: 38.3 % (ref 36.0–46.0)
Hemoglobin: 12.5 g/dL (ref 12.0–15.0)
MCH: 29.9 pg (ref 26.0–34.0)
MCHC: 32.6 g/dL (ref 30.0–36.0)
MCV: 91.6 fL (ref 80.0–100.0)
Platelets: 133 10*3/uL — ABNORMAL LOW (ref 150–400)
RBC: 4.18 MIL/uL (ref 3.87–5.11)
RDW: 14.1 % (ref 11.5–15.5)
WBC: 3.6 10*3/uL — ABNORMAL LOW (ref 4.0–10.5)
nRBC: 0 % (ref 0.0–0.2)

## 2019-05-20 LAB — BASIC METABOLIC PANEL
Anion gap: 13 (ref 5–15)
BUN: 20 mg/dL (ref 8–23)
CO2: 26 mmol/L (ref 22–32)
Calcium: 9.1 mg/dL (ref 8.9–10.3)
Chloride: 101 mmol/L (ref 98–111)
Creatinine, Ser: 1.15 mg/dL — ABNORMAL HIGH (ref 0.44–1.00)
GFR calc Af Amer: 51 mL/min — ABNORMAL LOW (ref >60)
GFR calc non Af Amer: 44 mL/min — ABNORMAL LOW (ref >60)
Glucose, Bld: 176 mg/dL — ABNORMAL HIGH (ref 70–99)
Potassium: 3.2 mmol/L — ABNORMAL LOW (ref 3.5–5.1)
Sodium: 140 mmol/L (ref 135–145)

## 2019-05-20 LAB — HEPARIN LEVEL (UNFRACTIONATED): Heparin Unfractionated: <0.1 IU/mL — ABNORMAL LOW (ref 0.30–0.70)

## 2019-05-20 LAB — MAGNESIUM: Magnesium: 2.3 mg/dL (ref 1.7–2.4)

## 2019-05-20 MED ORDER — LEVOTHYROXINE SODIUM 100 MCG PO TABS: ORAL_TABLET | ORAL | 3 refills | Status: DC

## 2019-05-20 MED ORDER — SACUBITRIL-VALSARTAN 24-26 MG PO TABS
1.0000 | ORAL_TABLET | Freq: Two times a day (BID) | ORAL | Status: DC
  Administered 2019-05-20 – 2019-05-21 (×2): 1 via ORAL
  Filled 2019-05-20 (×2): qty 1

## 2019-05-20 MED ORDER — APIXABAN 5 MG PO TABS
5.0000 mg | ORAL_TABLET | Freq: Two times a day (BID) | ORAL | Status: DC
  Administered 2019-05-20 – 2019-05-21 (×3): 5 mg via ORAL
  Filled 2019-05-20 (×3): qty 1

## 2019-05-20 MED ORDER — POTASSIUM CHLORIDE CRYS ER 20 MEQ PO TBCR
40.0000 meq | EXTENDED_RELEASE_TABLET | Freq: Two times a day (BID) | ORAL | Status: DC
  Administered 2019-05-20 – 2019-05-21 (×3): 40 meq via ORAL
  Filled 2019-05-20 (×3): qty 2

## 2019-05-20 MED ORDER — FUROSEMIDE 40 MG PO TABS
40.0000 mg | ORAL_TABLET | Freq: Two times a day (BID) | ORAL | Status: DC
  Administered 2019-05-20 – 2019-05-21 (×2): 40 mg via ORAL
  Filled 2019-05-20 (×2): qty 1

## 2019-05-20 MED ORDER — ROSUVASTATIN CALCIUM 5 MG PO TABS
10.0000 mg | ORAL_TABLET | Freq: Every day | ORAL | Status: DC
  Administered 2019-05-20 – 2019-05-21 (×2): 10 mg via ORAL
  Filled 2019-05-20 (×2): qty 2

## 2019-05-20 NOTE — Care Management (Signed)
05-20-19 1118 Benefits check submitted for sacubitril-valsartan (ENTRESTO) 24-26 mg per tablet : Dose 1 tablet : Oral : 2 times daily. Case Manager will follow for cost. Graves-Bigelow, Ocie Cornfield, RN,BSN Case Manager

## 2019-05-20 NOTE — Telephone Encounter (Signed)
Refill sent.

## 2019-05-20 NOTE — TOC Benefit Eligibility Note (Signed)
Transition of Care [facility]) Benefit Eligibility Note    Patient Details  Name: Amanda Sellers MRN: [id number] Date of Birth: Dec 23, 1932   Medication/Dose: Delene Loll   24-26 MG BID  Covered?: Yes  Tier: 3 Drug  Prescription Coverage [facility]    [place] with Person/Company/Phone Number:: JANELL   @ [employer] EQ #  [phone]  Co-Pay: $45.00  Prior Approval: No  Deductible: Met       Memory Argue Phone Number: 05/20/2019, 12:53 PM

## 2019-05-20 NOTE — Care Management Important Message (Signed)
Important Message  Patient Details  Name: Amanda Sellers MRN: [id number] Date of Birth: 10/21/1932   Medicare Important Message Given:  Yes     [name] 05/20/2019, 10:04 AM

## 2019-05-20 NOTE — Progress Notes (Addendum)
Progress Note  Patient Name: Amanda Sellers Date of Encounter: 05/20/2019  Primary Cardiologist: Glenetta Hew, MD   Subjective   Denies any CP or SOB. Eager to go home  Inpatient Medications    Scheduled Meds: . amiodarone  100 mg Oral Daily  . amLODipine  5 mg Oral Daily  . aspirin  81 mg Oral Daily  . ferrous sulfate  325 mg Oral BID WC  . furosemide  60 mg Oral BID  . levothyroxine  100 mcg Oral QAC breakfast  . losartan  12.5 mg Oral Daily  . potassium chloride  40 mEq Oral BID  . pravastatin  80 mg Oral q1800  . sodium chloride flush  3 mL Intravenous Q12H  . sodium chloride flush  3 mL Intravenous Q12H   Continuous Infusions: . sodium chloride    . sodium chloride     PRN Meds: sodium chloride, sodium chloride, [DISCONTINUED] acetaminophen **OR** acetaminophen, acetaminophen, ALPRAZolam, diazepam, ipratropium-albuterol, ondansetron **OR** ondansetron (ZOFRAN) IV, sodium chloride flush, sodium chloride flush, traMADol   Vital Signs    Vitals:   05/20/19 0304 05/20/19 0746 05/20/19 0833 05/20/19 0948  BP: 135/69  135/65 122/66  Pulse:  78 87   Resp:  20    Temp: 97.6 F (36.4 C)  98.1 F (36.7 C)   TempSrc: Oral  Oral   SpO2:  97% 100%   Weight: 90.8 kg     Height:        Intake/Output Summary (Last 24 hours) at 05/20/2019 1029 Last data filed at 05/19/2019 2130 Gross per 24 hour  Intake 363 ml  Output --  Net 363 ml   Last 3 Weights 05/20/2019 05/19/2019 05/15/2019  Weight (lbs) 200 lb 1.6 oz 200 lb 14.4 oz 210 lb 8.6 oz  Weight (kg) 90.765 kg 91.128 kg 95.5 kg      Telemetry    NSR without significant ST-T wave changes - Personally Reviewed  ECG    NSR with LBBB - Personally Reviewed  Physical Exam   GEN: No acute distress.   Neck: No JVD Cardiac: RRR, no murmurs, rubs, or gallops.  Respiratory: Clear to auscultation bilaterally. GI: Soft, nontender, non-distended  MS: No edema; No deformity. Neuro:  Nonfocal  Psych: Normal affect    Labs    High Sensitivity Troponin:   Recent Labs  Lab 05/15/19 0450 05/15/19 0755 05/15/19 1148 05/15/19 1256  TROPONINIHS 17 165* 352* 319*      Chemistry Recent Labs  Lab 05/15/19 0450 05/16/19 0445 05/17/19 0501 05/17/19 0501 05/18/19 [id number] 05/19/19 1559 05/19/19 1602 05/19/19 1604 05/20/19 0338  NA 141   < > 142   < > 140   < > 141 141 140  K 3.4*   < > 4.1   < > 4.0   < > 3.7 3.7 3.2*  CL 99   < > 101  --  104  --   --   --  101  CO2 30   < > 29  --  26  --   --   --  26  GLUCOSE 175*   < > 128*  --  127*  --   --   --  176*  BUN 21   < > 19  --  20  --   --   --  20  CREATININE 1.01*   < > 1.07*  --  0.95  --   --   --  1.15*  CALCIUM 8.7*   < >  9.3  --  9.0  --   --   --  9.1  PROT 7.0  --   --   --   --   --   --   --   --   ALBUMIN 4.3  --   --   --   --   --   --   --   --   AST 53*  --   --   --   --   --   --   --   --   ALT 49*  --   --   --   --   --   --   --   --   ALKPHOS 52  --   --   --   --   --   --   --   --   BILITOT 0.8  --   --   --   --   --   --   --   --   GFRNONAA 51*   < > 48*  --  55*  --   --   --  44*  GFRAA 59*   < > 55*  --  >60  --   --   --  51*  ANIONGAP 12   < > 12  --  10  --   --   --  13   < > = values in this interval not displayed.     Hematology Recent Labs  Lab 05/18/19 [phone] 05/18/19 [id number] 05/19/19 0416 05/19/19 1559 05/19/19 1602 05/19/19 1604 05/20/19 0338  WBC 4.4  --  4.2  --   --   --  3.6*  RBC 4.07  --  3.84*  --   --   --  4.18  HGB 12.1   < > 11.3*   < > 13.3 13.3 12.5  HCT 38.2   < > 35.9*   < > 39.0 39.0 38.3  MCV 93.9  --  93.5  --   --   --  91.6  MCH 29.7  --  29.4  --   --   --  29.9  MCHC 31.7  --  31.5  --   --   --  32.6  RDW 14.1  --  14.2  --   --   --  14.1  PLT 133*  --  132*  --   --   --  133*   < > = values in this interval not displayed.    BNP Recent Labs  Lab 05/15/19 0450  BNP 138.0*     DDimer No results for input(s): DDIMER in the last 168 hours.   Radiology     CARDIAC CATHETERIZATION  Result Date: 05/19/2019  1st Diag lesion is 50% stenosed.  Dist LM to Ost LAD lesion is 30% stenosed.  Ramus lesion is 70% stenosed.  Moderate right heart pressure elevation with moderately severe PA systolic pressure elevation at 62 mm with mean pressure at 43 mm.  Pulmonary venous hypertension secondary to left-sided heart failure. Prominent V wave up to 40 mm on PW tracing. Two-vessel coronary obstructive disease with 30% ostial LAD stenosis and 50% ostial first diagonal stenosis; 70% stenosis in the proximal ramus intermediate vessel; normal dominant left circumflex coronary artery; normal nondominant RCA. RECOMMENDATION: Guideline directed medical therapy for reduced LV function with echo documentation EF at 35 to 40%.  We will add amlodipine with CAD.  Resume  anticoagulation for PAF in a.m.    Cardiac Studies   Echo 05/15/2019 1. Left ventricular ejection fraction, by estimation, is 35 to 40%. The  left ventricle has moderately decreased function. The left ventricle  demonstrates global hypokinesis. There is mild concentric left ventricular  hypertrophy. Left ventricular  diastolic parameters are consistent with Grade II diastolic dysfunction  (pseudonormalization). Elevated left ventricular end-diastolic pressure.  2. Right ventricular systolic function is low normal. The right  ventricular size is normal.  3. Left atrial size was mildly dilated.  4. The mitral valve is degenerative. Mild mitral valve regurgitation.  5. The aortic valve is tricuspid. Aortic valve regurgitation is trivial.  No aortic stenosis is present.     Cath 05/19/2019  1st Diag lesion is 50% stenosed.  Dist LM to Ost LAD lesion is 30% stenosed.  Ramus lesion is 70% stenosed.   Moderate right heart pressure elevation with moderately severe PA systolic pressure elevation at 62 mm with mean pressure at 43 mm.  Pulmonary venous hypertension secondary to left-sided heart  failure.  Prominent V wave up to 40 mm on PW tracing.  Two-vessel coronary obstructive disease with 30% ostial LAD stenosis and 50% ostial first diagonal stenosis; 70% stenosis in the proximal ramus intermediate vessel; normal dominant left circumflex coronary artery; normal nondominant RCA.  RECOMMENDATION:  Guideline directed medical therapy for reduced LV function with echo documentation EF at 35 to 40%.  We will add amlodipine with CAD.  Resume anticoagulation for PAF in a.m.  Patient Profile     86 y.o. female with PMH of PAF, chronic diastolic CHF, HTN, and non-hodgkin's lymphoma who presented to [facility] with shortness of breath. Recent echo showed EF dropped from previous 55-60% in 2016 to 35-40% now. She was treated for acute combined systolic and diastolic CHF. Troponin trended up, given LV dysfunction, she was transferred to [facility] for cath  Assessment & Plan    1. Acute combined systolic and diastolic heart failure  - Echo 05/15/2019 EF 35-40%, grad 2 DD, mild MR.   - convert ARB to 24-26mg  BID Entresto.   - appears to be euvolemic on exam with no JVD, no LE edema and clear lungs, however her right heart pressure was very high yesterday. MD to see, unclear if elevated RV pressure is due to excess fluid on board vs untreated OSA vs lung issue  - start on low dose entresto, decrease PO lasix from 60mg  BID to 40mg  BID given the mild diuretic effect of entresto. Will need close outpatient followup and BMET   2. Elevated troponin  - Cath 05/19/2019 50% D1, 30% ost LAD, 70% ramus. PA peak pressure 77mmHg. Low dose amlodipine was added for CAD   3. PAF: maintaining sinus rhythm on amiodarone. Will need to restart eliquis.   - not on BB due to bradycardia  - unclear reason behind the drop in her EF, given nonobstructive CAD, consider 30 day event monitor to look for break through afib. Also per previous EKG between 2019 and 2020, she has some intermittent widened vs narrow QRS, now stays in  LBBB, unclear if dyssynergy of ventricular contraction can cause drop in EF.   4. HTN: will switch her losartan to 24-26mg  BID Entresto (note, she was on valsartan at home)   5. HLD: will switch her pravastatin to crestor for better lipid control  6. Non-hodgkin's lymphoma: underwent 4 cycles of chemo with bendamustine and rituximab. Followed up by Dr. Benay Spice  7. OSA: does not remember  getting tested, not on CPAP at home    For questions or updates, please contact [place] Please consult [email or web address] for contact info under    Signed, Almyra Deforest, [place]  05/20/2019, 10:29 AM   Patient seen and examined. Agree with assessment and plan.  Patient feels improved today and is less short of breath.  I thoroughly reviewed her right and left heart cardiac catheterization findings with she and her daughter today.  She was found to have moderately severe pulmonary hypertension with a peak PA systolic pressure at 62 and a mean pulmonary artery pressure at 43 most likely due to pulmonary venous hypertension.  LVEDP was significantly increased at 31.  She has felt improved with diuresis.  I thoroughly reviewed her coronary anatomy with her tightest lesion being a 70% ramus immediate stenosis although there was mild to moderate disease at her LAD ostium and first diagonal vessel.  Amlodipine was added for anti-ischemic benefit.  Patient had been on low-dose valsartan at home.  With her EF of 35% stable blood pressure will transition today to Entresto 24/26 mg twice a day.  Slightly reduce her furosemide dose.  She is maintaining sinus rhythm on amiodarone without recurrent atrial fibrillation.  Eliquis was restarted this morning.  K replete to greater than 4.  Follow-up laboratory in a.m.  Possible discharge tomorrow if remains stable.   Troy Sine, MD, [facility] 05/20/2019 12:25 PM

## 2019-05-20 NOTE — Progress Notes (Signed)
PROGRESS NOTE    Amanda Sellers  [id number] DOB: 1932/10/07 DOA: 05/15/2019 PCP: Tammi Sou, MD   Brief Narrative:  Per HPI: Amanda Parmelee Friddleis a 86 y.o.femalewith medical history significant forhypertension, hypothyroidism, CKD stage IIIa, atrial fibrillation on Eliquis, COPD on intermittent home oxygen, OSA with obesity on CPAP not currently using, non-Hodgkin's lymphoma, anemia of chronic disease, dyslipidemia, and diastolic heart failure who presented to the ED with acute onset of shortness of breath that began at approximately 4 AM. Her pulse oximetry at home was approximately 70% and once EMS arrived they had placed her on nonrebreather mask and she was still noted to have saturations in the mid 80th percentile. She had denied any chest pain initially, but now claims that she has had some intermittent substernal chest pain with radiation to her left arm over the last couple weeks. Her legs have demonstrated swelling and she does not check her weight regularly. She claims to be taking her home Lasix 60 mg twice daily and has had good urine output.  Patient was diuresed with IV Lasix at [facility].  Clinically improving.  Transfer to [facility] for cardiac cath on 4/26.  Underwent cardiac cath and found to have post diagonal lesion for percent stenosis, distal left main 30%, ramus 70%.  Found to have significant elevated right atrial pressure and pulmonary hypertension.  Medical management.  Assessment & Plan:   Active Problems:   Acute hypoxemic respiratory failure (HCC)   Acute on chronic congestive heart failure (HCC)   Dilated cardiomyopathy (HCC)   Acute on chronic combined systolic and diastolic CHF (congestive heart failure) (HCC)   Coronary artery disease involving native coronary artery of native heart with angina pectoris ([place])   Acute hypoxemic respiratory failure secondary to acute on chronic diastolic congestive heart failure  decompensation-resolved -Patient on room air, appropriately responded to diuresis. -Continue on home dose Lasix 60 mg twice daily -Fluid restrictionto continue -2D echocardiogramperformed with diminished LVEF of 35-40% noted -Cardiology added amlodipine and Entresto.  Elevated troponin -Appreciate cardiology recommendations, continue telemetry monitoring -Treated with IV heparin.  Currently off. -Continue pravastatin 80 mg daily and Entresto.   Mild hypokalemia-repleted.  Replete further.  Recheck tomorrow morning. -Order supplementation and follow with ongoing diuresis  Paroxysmal atrial fibrillation -Currently in sinus rhythm -Resume Eliquis today. -Continue amiodarone  Hypertension Stable.  CKD stage IIIa -Fluctuating levels.  Monitor closely.   -Continue to follow repeat labs with ongoing diuresis  Hypothyroidism -Continue levothyroxine -Recent TSH 02/03/2019 of 3.41  Dyslipidemia -Continue statin as noted above  COPD on intermittent home oxygen -No bronchospasms currently noted -We will order breathing treatments as needed  Non-Hodgkin's B-cell lymphoma -Follows with Dr.Sherrill, Oncology  Obesity/OSA -CPAP at night, patient states she has not been using it.   DVT prophylaxis:Eliquis Code Status:Full code Family Communication:Daughter communicated by cardiology. Disposition Plan: Status is: Inpatient  Remains inpatient appropriate because:Inpatient level of care appropriate due to severity of illness   Dispo: The patient is from: Home              Anticipated d/c is to: Home              Anticipated d/c date is: 1 day              Patient currently is not medically stable to d/c.  Mobilize.  Work with PT OT today.      Consultants:  Cardiology  Procedures:  2D echocardiogram 4/22 with LVEF 35-40%  Left and right heart cath, results above  Antimicrobials:   None  Subjective: Patient seen and examined in the  morning rounds.  She was wondering whether she can go home.  Objective: Vitals:   05/20/19 0304 05/20/19 0746 05/20/19 0833 05/20/19 0948  BP: 135/69  135/65 122/66  Pulse:  78 87   Resp:  20    Temp: 97.6 F (36.4 C)  98.1 F (36.7 C)   TempSrc: Oral  Oral   SpO2:  97% 100%   Weight: 90.8 kg     Height:        Intake/Output Summary (Last 24 hours) at 05/20/2019 1347 Last data filed at 05/20/2019 1219 Gross per 24 hour  Intake 723 ml  Output 600 ml  Net 123 ml   Filed Weights   05/15/19 0453 05/19/19 1100 05/20/19 0304  Weight: 95.5 kg 91.1 kg 90.8 kg    Examination:  General exam: Appears calm and comfortable, on room air. Respiratory system: Clear to auscultation. Respiratory effort normal. Cardiovascular system: S1 & S2 heard, RRR. No JVD, murmurs, rubs, gallops or clicks.  Trace bilateral pedal edema. Gastrointestinal system: Abdomen is nondistended, soft and nontender. No organomegaly or masses felt. Normal bowel sounds heard. Central nervous system: Alert and oriented. No focal neurological deficits. Extremities: Symmetric 5 x 5 power. Skin: No rashes, lesions or ulcers Psychiatry: Judgement and insight appear normal. Mood & affect appropriate.     Data Reviewed: I have personally reviewed following labs and imaging studies  CBC: Recent Labs  Lab 05/15/19 0450 05/15/19 0450 05/16/19 0445 05/16/19 0445 05/17/19 0501 05/17/19 0501 05/18/19 [id number] 05/18/19 [id number] 05/19/19 0416 05/19/19 1559 05/19/19 1602 05/19/19 1604 05/20/19 0338  WBC 5.2   < > 3.8*  --  4.4  --  4.4  --  4.2  --   --   --  3.6*  NEUTROABS 2.5  --   --   --   --   --   --   --   --   --   --   --   --   HGB 13.7   < > 12.2   < > 12.0   < > 12.1   < > 11.3* 12.9 13.3 13.3 12.5  HCT 44.9   < > 39.8   < > 38.3   < > 38.2   < > 35.9* 38.0 39.0 39.0 38.3  MCV 94.9   < > 94.1  --  93.4  --  93.9  --  93.5  --   --   --  91.6  PLT 141*   < > 133*  --  127*  --  133*  --  132*  --   --   --   133*   < > = values in this interval not displayed.   Basic Metabolic Panel: Recent Labs  Lab 05/15/19 0450 05/15/19 0450 05/16/19 0445 05/16/19 0445 05/17/19 0501 05/17/19 0501 05/18/19 [id number] 05/19/19 1559 05/19/19 1602 05/19/19 1604 05/20/19 0338  NA 141   < > 141   < > 142   < > 140 141 141 141 140  K 3.4*   < > 3.9   < > 4.1   < > 4.0 3.6 3.7 3.7 3.2*  CL 99  --  101  --  101  --  104  --   --   --  101  CO2 30  --  28  --  29  --  26  --   --   --  26  GLUCOSE 175*  --  131*  --  128*  --  127*  --   --   --  176*  BUN 21  --  16  --  19  --  20  --   --   --  20  CREATININE 1.01*  --  0.87  --  1.07*  --  0.95  --   --   --  1.15*  CALCIUM 8.7*  --  8.8*  --  9.3  --  9.0  --   --   --  9.1  MG 2.6*  --  2.5*  --  2.5*  --  2.6*  --   --   --  2.3   < > = values in this interval not displayed.   GFR: Estimated Creatinine Clearance: 41.3 mL/min (A) (by C-G formula based on SCr of 1.15 mg/dL (H)). Liver Function Tests: Recent Labs  Lab 05/15/19 0450  AST 53*  ALT 49*  ALKPHOS 52  BILITOT 0.8  PROT 7.0  ALBUMIN 4.3   No results for input(s): LIPASE, AMYLASE in the last 168 hours. No results for input(s): AMMONIA in the last 168 hours. Coagulation Profile: Recent Labs  Lab 05/15/19 1253  INR 1.1   Cardiac Enzymes: No results for input(s): CKTOTAL, CKMB, CKMBINDEX, TROPONINI in the last 168 hours. BNP (last 3 results) No results for input(s): PROBNP in the last 8760 hours. HbA1C: No results for input(s): HGBA1C in the last 72 hours. CBG: No results for input(s): GLUCAP in the last 168 hours. Lipid Profile: No results for input(s): CHOL, HDL, LDLCALC, TRIG, CHOLHDL, LDLDIRECT in the last 72 hours. Thyroid Function Tests: No results for input(s): TSH, T4TOTAL, FREET4, T3FREE, THYROIDAB in the last 72 hours. Anemia Panel: No results for input(s): VITAMINB12, FOLATE, FERRITIN, TIBC, IRON, RETICCTPCT in the last 72 hours. Sepsis Labs: No results for  input(s): PROCALCITON, LATICACIDVEN in the last 168 hours.  Recent Results (from the past 240 hour(s))  Respiratory Panel by RT PCR (Flu A&B, Covid) - Nasopharyngeal Swab     Status: None   Collection Time: 05/15/19  4:55 AM   Specimen: Nasopharyngeal Swab  Result Value Ref Range Status   SARS Coronavirus 2 by RT PCR NEGATIVE NEGATIVE Final    Comment: (NOTE) SARS-CoV-2 target nucleic acids are NOT DETECTED. The SARS-CoV-2 RNA is generally detectable in upper respiratoy specimens during the acute phase of infection. The lowest concentration of SARS-CoV-2 viral copies this assay can detect is 131 copies/mL. A negative result does not preclude SARS-Cov-2 infection and should not be used as the sole basis for treatment or other patient management decisions. A negative result may occur with  improper specimen collection/handling, submission of specimen other than nasopharyngeal swab, presence of viral mutation(s) within the areas targeted by this assay, and inadequate number of viral copies (<131 copies/mL). A negative result must be combined with clinical observations, patient history, and epidemiological information. The expected result is Negative. Fact Sheet for Patients:  PinkCheek.be Fact Sheet for Healthcare Providers:  GravelBags.it This test is not yet ap proved or cleared by the [place] FDA and  has been authorized for detection and/or diagnosis of SARS-CoV-2 by FDA under an Emergency Use Authorization (EUA). This EUA will remain  in effect (meaning this test can be used) for the duration of the COVID-19 declaration under Section 564(b)(1) of the Act, 21 U.S.C. section 360bbb-3(b)(1), unless the authorization is terminated or revoked sooner.    Influenza  A by PCR NEGATIVE NEGATIVE Final   Influenza B by PCR NEGATIVE NEGATIVE Final    Comment: (NOTE) The Xpert Xpress SARS-CoV-2/FLU/RSV assay is intended as an aid  in  the diagnosis of influenza from Nasopharyngeal swab specimens and  should not be used as a sole basis for treatment. Nasal washings and  aspirates are unacceptable for Xpert Xpress SARS-CoV-2/FLU/RSV  testing. Fact Sheet for Patients: PinkCheek.be Fact Sheet for Healthcare Providers: GravelBags.it This test is not yet approved or cleared by the [place] FDA and  has been authorized for detection and/or diagnosis of SARS-CoV-2 by  FDA under an Emergency Use Authorization (EUA). This EUA will remain  in effect (meaning this test can be used) for the duration of the  Covid-19 declaration under Section 564(b)(1) of the Act, 21  U.S.C. section 360bbb-3(b)(1), unless the authorization is  terminated or revoked. Performed at [facility], [address] [place], [place] [address]          Radiology Studies: CARDIAC CATHETERIZATION  Result Date: 05/19/2019  1st Diag lesion is 50% stenosed.  Dist LM to Ost LAD lesion is 30% stenosed.  Ramus lesion is 70% stenosed.  Moderate right heart pressure elevation with moderately severe PA systolic pressure elevation at 62 mm with mean pressure at 43 mm.  Pulmonary venous hypertension secondary to left-sided heart failure. Prominent V wave up to 40 mm on PW tracing. Two-vessel coronary obstructive disease with 30% ostial LAD stenosis and 50% ostial first diagonal stenosis; 70% stenosis in the proximal ramus intermediate vessel; normal dominant left circumflex coronary artery; normal nondominant RCA. RECOMMENDATION: Guideline directed medical therapy for reduced LV function with echo documentation EF at 35 to 40%.  We will add amlodipine with CAD.  Resume anticoagulation for PAF in a.m.        Scheduled Meds: . amiodarone  100 mg Oral Daily  . amLODipine  5 mg Oral Daily  . apixaban  5 mg Oral BID  . aspirin  81 mg Oral Daily  . ferrous sulfate  325 mg Oral BID WC  . furosemide   40 mg Oral BID  . levothyroxine  100 mcg Oral QAC breakfast  . potassium chloride  40 mEq Oral BID  . rosuvastatin  10 mg Oral Daily  . sacubitril-valsartan  1 tablet Oral BID  . sodium chloride flush  3 mL Intravenous Q12H  . sodium chloride flush  3 mL Intravenous Q12H   Continuous Infusions: . sodium chloride    . sodium chloride       LOS: 5 days    Time spent: 30 minutes    Barb Merino, MD [facility]  If 7PM-7AM, please contact night-coverage [email or web address] 05/20/2019, 1:47 PM

## 2019-05-21 DIAGNOSIS — R778 Other specified abnormalities of plasma proteins: Secondary | ICD-10-CM

## 2019-05-21 DIAGNOSIS — I2722 Pulmonary hypertension due to left heart disease: Secondary | ICD-10-CM

## 2019-05-21 DIAGNOSIS — I272 Pulmonary hypertension, unspecified: Secondary | ICD-10-CM

## 2019-05-21 DIAGNOSIS — J441 Chronic obstructive pulmonary disease with (acute) exacerbation: Secondary | ICD-10-CM

## 2019-05-21 DIAGNOSIS — E039 Hypothyroidism, unspecified: Secondary | ICD-10-CM

## 2019-05-21 DIAGNOSIS — J449 Chronic obstructive pulmonary disease, unspecified: Secondary | ICD-10-CM | POA: Insufficient documentation

## 2019-05-21 LAB — BASIC METABOLIC PANEL
Anion gap: 13 (ref 5–15)
BUN: 25 mg/dL — ABNORMAL HIGH (ref 8–23)
CO2: 23 mmol/L (ref 22–32)
Calcium: 8.9 mg/dL (ref 8.9–10.3)
Chloride: 106 mmol/L (ref 98–111)
Creatinine, Ser: 1.12 mg/dL — ABNORMAL HIGH (ref 0.44–1.00)
GFR calc Af Amer: 52 mL/min — ABNORMAL LOW (ref >60)
GFR calc non Af Amer: 45 mL/min — ABNORMAL LOW (ref >60)
Glucose, Bld: 110 mg/dL — ABNORMAL HIGH (ref 70–99)
Potassium: 3.9 mmol/L (ref 3.5–5.1)
Sodium: 142 mmol/L (ref 135–145)

## 2019-05-21 LAB — CBC
HCT: 41.2 % (ref 36.0–46.0)
Hemoglobin: 13.1 g/dL (ref 12.0–15.0)
MCH: 29.4 pg (ref 26.0–34.0)
MCHC: 31.8 g/dL (ref 30.0–36.0)
MCV: 92.6 fL (ref 80.0–100.0)
Platelets: 143 10*3/uL — ABNORMAL LOW (ref 150–400)
RBC: 4.45 MIL/uL (ref 3.87–5.11)
RDW: 14.5 % (ref 11.5–15.5)
WBC: 5 10*3/uL (ref 4.0–10.5)
nRBC: 0 % (ref 0.0–0.2)

## 2019-05-21 LAB — LIPID PANEL
Cholesterol: 222 mg/dL — ABNORMAL HIGH (ref 0–200)
HDL: 68 mg/dL (ref >40)
LDL Cholesterol: 122 mg/dL — ABNORMAL HIGH (ref 0–99)
Total CHOL/HDL Ratio: 3.3 RATIO
Triglycerides: 161 mg/dL — ABNORMAL HIGH (ref <150)
VLDL: 32 mg/dL (ref 0–40)

## 2019-05-21 LAB — MAGNESIUM: Magnesium: 2.4 mg/dL (ref 1.7–2.4)

## 2019-05-21 LAB — PHOSPHORUS: Phosphorus: 4.7 mg/dL — ABNORMAL HIGH (ref 2.5–4.6)

## 2019-05-21 MED ORDER — ROSUVASTATIN CALCIUM 20 MG PO TABS: 20.0000 mg | ORAL_TABLET | Freq: Every day | ORAL | 2 refills | Status: DC

## 2019-05-21 MED ORDER — DIPHENHYDRAMINE HCL 25 MG PO CAPS
25.0000 mg | ORAL_CAPSULE | Freq: Once | ORAL | Status: AC
  Administered 2019-05-21: 13:00:00 25 mg via ORAL
  Filled 2019-05-21: qty 1

## 2019-05-21 MED ORDER — AMLODIPINE BESYLATE 2.5 MG PO TABS: 2.5000 mg | ORAL_TABLET | Freq: Every day | ORAL | 3 refills | Status: DC

## 2019-05-21 MED ORDER — FUROSEMIDE 40 MG PO TABS: 40.0000 mg | ORAL_TABLET | Freq: Every day | ORAL | 2 refills | Status: DC

## 2019-05-21 MED ORDER — ROSUVASTATIN CALCIUM 20 MG PO TABS: 20.0000 mg | ORAL_TABLET | Freq: Every day | ORAL | Status: DC

## 2019-05-21 MED ORDER — SACUBITRIL-VALSARTAN 24-26 MG PO TABS: 1.0000 | ORAL_TABLET | Freq: Two times a day (BID) | ORAL | 2 refills | Status: DC

## 2019-05-21 MED FILL — ENTRESTO 24 MG-26 MG TABLET: 24-26 | 30 days supply | Qty: 60 | Fill #0

## 2019-05-21 MED FILL — AMLODIPINE BESYLATE 2.5 MG: 2.5 | 90 days supply | Qty: 90 | Fill #0

## 2019-05-21 MED FILL — ROSUVASTATIN CALCIUM 20 MG: 20 | 30 days supply | Qty: 30 | Fill #0

## 2019-05-21 NOTE — Evaluation (Addendum)
Occupational Therapy Evaluation Patient Details Name: Amanda Sellers MRN: [id number] DOB: [birth year] Today's Date: 05/21/2019    History of Present Illness Pt is a 86 y/o female with PMH of HTN, CKD stage 3, atrial fib, COPD on intermittent home O2, OSA, non-Hodgkin's lymphoma, anemia, diastolic heart failure, presenting to ED with acute onset of SOB found with SpO2 in 70s. Admitted with acute hypoxemic respiratory failure. S/P cardiac cath 4/26.    Clinical Impression   PTA Patient reports using RW, completing ADLs and limited IADLs with modified independence. Admitted for above and limited by problem list below, including decreased activity tolerance.  Patient demonstrates ability to complete transfers, in room mobility and ADLS with modified independence.  With shower transfers (simulated) SPO2 on RA desaturation to 83% with cueing for PLB and standing rest break to recover within 15 seconds.  Reviewed energy conservation techniques and safety with pt and daughter, who verbalized understanding with recommednations. She has great support at home.  Based on performance today, no further OT needed at this time.  Thank you for this referral.      Follow Up Recommendations  No OT follow up;Supervision - Intermittent    Equipment Recommendations  None recommended by OT    Recommendations for Other Services       Precautions / Restrictions Precautions Precautions: Fall Restrictions Weight Bearing Restrictions: No      Mobility Bed Mobility Overal bed mobility: Modified Independent             General bed mobility comments: HOB elevated, no assist required  Transfers Overall transfer level: Needs assistance Equipment used: None Transfers: Sit to/from Stand Sit to Stand: Modified independent (Device/Increase time)         General transfer comment: no assist required    Balance Overall balance assessment: Mild deficits observed, not formally tested                                          ADL either performed or assessed with clinical judgement   ADL Overall ADL's : Modified independent                                       General ADL Comments: pt demonstrates ability to complete transfers (including shower), LB ADLs and mobility with modified independence      Vision         Perception     Praxis      Pertinent Vitals/Pain Pain Assessment: No/denies pain     Hand Dominance     Extremity/Trunk Assessment Upper Extremity Assessment Upper Extremity Assessment: Overall WFL for tasks assessed   Lower Extremity Assessment Lower Extremity Assessment: Defer to PT evaluation   Cervical / Trunk Assessment Cervical / Trunk Assessment: Kyphotic   Communication Communication Communication: No difficulties   Cognition Arousal/Alertness: Awake/alert Behavior During Therapy: WFL for tasks assessed/performed Overall Cognitive Status: Within Functional Limits for tasks assessed                                     General Comments  pt on RA with SpO2 >96% at rest, dropped to 83% during shower transfers (simulated) and requires cueing for PLB; reviewed energy conservation techniques and recommendations; pt  and daughter agreeable and verbalized undersatnding     Exercises     Shoulder Instructions      Home Living Family/patient expects to be discharged to:: Private residence Living Arrangements: Children Available Help at Discharge: Family;Available 24 hours/day Type of Home: House Home Access: Ramped entrance     Home Layout: One level     Bathroom Shower/Tub: Occupational psychologist: Standard Bathroom Accessibility: Yes   Home Equipment: Clinical cytogeneticist - 2 wheels;Hand held shower head;Toilet riser          Prior Functioning/Environment Level of Independence: Independent with assistive device(s)        Comments: uses a RW, independent ADLs, limited iADLs,  driving; sits with "a lady" 3 days/week         OT Problem List: Cardiopulmonary status limiting activity      OT Treatment/Interventions:      OT Goals(Current goals can be found in the care plan section) Acute Rehab OT Goals Patient Stated Goal: go home OT Goal Formulation: With patient/family  OT Frequency:     Barriers to D/C:            Co-evaluation              AM-PAC OT "6 Clicks" Daily Activity     Outcome Measure Help from another person eating meals?: None Help from another person taking care of personal grooming?: None Help from another person toileting, which includes using toliet, bedpan, or urinal?: None Help from another person bathing (including washing, rinsing, drying)?: None Help from another person to put on and taking off regular upper body clothing?: None Help from another person to put on and taking off regular lower body clothing?: None 6 Click Score: 24   End of Session Equipment Utilized During Treatment: Rolling walker  Activity Tolerance: Patient tolerated treatment well Patient left: with call bell/phone within reach;Other (comment)(seated EOB )  OT Visit Diagnosis: Muscle weakness (generalized) (M62.81);Other abnormalities of gait and mobility (R26.89)                Time: [id number] OT Time Calculation (min): 21 min Charges:  OT General Charges $OT Visit: 1 Visit OT Evaluation $OT Eval Moderate Complexity: 1 Mod  Jolaine Artist, OT Acute Rehabilitation Services Pager [phone] Office [phone]   Delight Stare 05/21/2019, 10:13 AM

## 2019-05-21 NOTE — Progress Notes (Addendum)
The patient has an allergy to Aspirin. Refused dose this am. Benita Gutter PA with cardiology.    Saddie Benders RN BSN

## 2019-05-21 NOTE — Progress Notes (Addendum)
Progress Note  Patient Name: Amanda Sellers Grounds Date of Encounter: 05/21/2019  Primary Cardiologist: Glenetta Hew, MD   Subjective   No acute overnight events. Patient denies any chest pain, shortness of breath, or palpitations. She ambulated in the halls this morning without any problems. Very eager to go home.  Only complaint this morning is itching from the Aspirin. She states she has an allergy to Aspirin.   Inpatient Medications    Scheduled Meds: . amiodarone  100 mg Oral Daily  . amLODipine  5 mg Oral Daily  . apixaban  5 mg Oral BID  . aspirin  81 mg Oral Daily  . ferrous sulfate  325 mg Oral BID WC  . furosemide  40 mg Oral BID  . levothyroxine  100 mcg Oral QAC breakfast  . potassium chloride  40 mEq Oral BID  . rosuvastatin  10 mg Oral Daily  . sacubitril-valsartan  1 tablet Oral BID  . sodium chloride flush  3 mL Intravenous Q12H  . sodium chloride flush  3 mL Intravenous Q12H   Continuous Infusions: . sodium chloride    . sodium chloride     PRN Meds: sodium chloride, sodium chloride, [DISCONTINUED] acetaminophen **OR** acetaminophen, acetaminophen, ALPRAZolam, diazepam, ipratropium-albuterol, ondansetron **OR** ondansetron (ZOFRAN) IV, sodium chloride flush, sodium chloride flush, traMADol   Vital Signs    Vitals:   05/20/19 0948 05/20/19 2023 05/21/19 0548 05/21/19 0819  BP: 122/66 131/68 (!) 114/59 (!) 101/57  Pulse:  72 (!) 59   Resp:      Temp:  97.7 F (36.5 C) 97.6 F (36.4 C)   TempSrc:  Oral Oral   SpO2:  97% 98%   Weight:   92.3 kg   Height:        Intake/Output Summary (Last 24 hours) at 05/21/2019 1138 Last data filed at 05/21/2019 0535 Gross per 24 hour  Intake 780 ml  Output 1500 ml  Net -720 ml   Last 3 Weights 05/21/2019 05/20/2019 05/19/2019  Weight (lbs) 203 lb 7.8 oz 200 lb 1.6 oz 200 lb 14.4 oz  Weight (kg) 92.3 kg 90.765 kg 91.128 kg      Telemetry    Sinus rhythm with baseline rates in the 50's to 70's. Ocasional in  the 90's to low 100's (suspect this is with activity). - Personally Reviewed  ECG    No new ECG tracing today. - Personally Reviewed  Physical Exam   GEN: No acute distress.   Neck: Supple. No JVD Cardiac: RRR. No murmurs, rubs, or gallops. Right radial cath site soft with no signs of hematoma. Respiratory: No increased work of breathing. Initially course breath sounds but this improved with coughing. GI: Soft, non-tender, non-distended  MS: No edema. No deformity. Skin: Warm and dry. Neuro:  No focal deficits. Psych: Normal affect.   Labs    High Sensitivity Troponin:   Recent Labs  Lab 05/15/19 0450 05/15/19 0755 05/15/19 1148 05/15/19 1256  TROPONINIHS 17 165* 352* 319*      Chemistry Recent Labs  Lab 05/15/19 0450 05/16/19 0445 05/18/19 0613 05/19/19 1559 05/19/19 1604 05/20/19 0338 05/21/19 0545  NA 141   < > 140   < > 141 140 142  K 3.4*   < > 4.0   < > 3.7 3.2* 3.9  CL 99   < > 104  --   --  101 106  CO2 30   < > 26  --   --  26 23  GLUCOSE 175*   < > 127*  --   --  176* 110*  BUN 21   < > 20  --   --  20 25*  CREATININE 1.01*   < > 0.95  --   --  1.15* 1.12*  CALCIUM 8.7*   < > 9.0  --   --  9.1 8.9  PROT 7.0  --   --   --   --   --   --   ALBUMIN 4.3  --   --   --   --   --   --   AST 53*  --   --   --   --   --   --   ALT 49*  --   --   --   --   --   --   ALKPHOS 52  --   --   --   --   --   --   BILITOT 0.8  --   --   --   --   --   --   GFRNONAA 51*   < > 55*  --   --  44* 45*  GFRAA 59*   < > >60  --   --  51* 52*  ANIONGAP 12   < > 10  --   --  13 13   < > = values in this interval not displayed.     Hematology Recent Labs  Lab 05/19/19 0416 05/19/19 1559 05/19/19 1604 05/20/19 0338 05/21/19 0545  WBC 4.2  --   --  3.6* 5.0  RBC 3.84*  --   --  4.18 4.45  HGB 11.3*   < > 13.3 12.5 13.1  HCT 35.9*   < > 39.0 38.3 41.2  MCV 93.5  --   --  91.6 92.6  MCH 29.4  --   --  29.9 29.4  MCHC 31.5  --   --  32.6 31.8  RDW 14.2  --   --   14.1 14.5  PLT 132*  --   --  133* 143*   < > = values in this interval not displayed.    BNP Recent Labs  Lab 05/15/19 0450  BNP 138.0*     DDimer No results for input(s): DDIMER in the last 168 hours.   Radiology    CARDIAC CATHETERIZATION  Result Date: 05/19/2019  1st Diag lesion is 50% stenosed.  Dist LM to Ost LAD lesion is 30% stenosed.  Ramus lesion is 70% stenosed.  Moderate right heart pressure elevation with moderately severe PA systolic pressure elevation at 62 mm with mean pressure at 43 mm.  Pulmonary venous hypertension secondary to left-sided heart failure. Prominent V wave up to 40 mm on PW tracing. Two-vessel coronary obstructive disease with 30% ostial LAD stenosis and 50% ostial first diagonal stenosis; 70% stenosis in the proximal ramus intermediate vessel; normal dominant left circumflex coronary artery; normal nondominant RCA. RECOMMENDATION: Guideline directed medical therapy for reduced LV function with echo documentation EF at 35 to 40%.  We will add amlodipine with CAD.  Resume anticoagulation for PAF in a.m.    Cardiac Studies   Echocardiogram 05/15/2019: Impressions: 1. Left ventricular ejection fraction, by estimation, is 35 to 40%. The  left ventricle has moderately decreased function. The left ventricle  demonstrates global hypokinesis. There is mild concentric left ventricular  hypertrophy. Left ventricular  diastolic parameters are consistent with Grade II diastolic dysfunction  (  pseudonormalization). Elevated left ventricular end-diastolic pressure.  2. Right ventricular systolic function is low normal. The right  ventricular size is normal.  3. Left atrial size was mildly dilated.  4. The mitral valve is degenerative. Mild mitral valve regurgitation.  5. The aortic valve is tricuspid. Aortic valve regurgitation is trivial.  No aortic stenosis is present.  _______________  Right/Left Heart Catheterization 05/19/2019:  1st Diag lesion is  50% stenosed.  Dist LM to Ost LAD lesion is 30% stenosed.  Ramus lesion is 70% stenosed.   Moderate right heart pressure elevation with moderately severe PA systolic pressure elevation at 62 mm with mean pressure at 43 mm.  Pulmonary venous hypertension secondary to left-sided heart failure.  Prominent V wave up to 40 mm on PW tracing.  Two-vessel coronary obstructive disease with 30% ostial LAD stenosis and 50% ostial first diagonal stenosis; 70% stenosis in the proximal ramus intermediate vessel; normal dominant left circumflex coronary artery; normal nondominant RCA.  Recommendations: Guideline directed medical therapy for reduced LV function with echo documentation EF at 35 to 40%.  We will add amlodipine with CAD.  Resume anticoagulation for PAF in a.m.  Patient Profile     ARTICIA HOLTZINGER is a 86 year old female with a history of chronic diastolic CHF, paroxysmal atrial fibrillation on Amiodarone and Eliquis, COPD, hypertension, hyperlipidemia, hypothyroidism, anemia, and non-Hodgkin's lymphoma treated with chemo (Bendamustine and Rituximab) currently in remission, who was admitted with acute on chronic CHF and elevated troponin.   Assessment & Plan    Acute on Chronic Combined CHF  - Echo showed LVEF of 35-40% with global hypokinesis, mild LVH, grade 2 diastolic dysfunction, mild MR, and trivial AI.  - Right heart catheterization after effective diuresis showed moderate right heart pressure elevation with moderately severe PA systolic pressure elevation at 62 mmHg (mean pressure at 43 mmHg) as well as pulmonary venous hypertension secondary to left-sided heart failure and prominent V wave up to 40 mmHg on PW tracing.  - Patient transitioned to PO Lasix on 05/18/2019. Documented urinary output of 1.5 L in the past 24 hours and net negative 1.644 L since admission. Weight 203 lbs today, up 3lbs from yesterday but down 7 lbs from admission. Creatinine stable but BUN slightly up. -  Continue Lasix 40mg  twice daily. Will likely plan to discharge patient on K-Dur 40 mEq at discharge. - Continue Enresto 24-26mg  twice daily.  - No beta-blocker due to some baseline bradycardia and soft BP at times. - Discussed importance of daily weights and sodium/fluid restrictions. - Will need repeat BMET at follow-up.   CAD with Elevated Troponin  - High-sensitivity troponin peaked at 352.  - Left heart catheterization showed 2-vessel CAD with 30% stenosis of ostial LAD, 50% stenosis of ostial 1st Diag, and 70% stenosis of the proximal ramus intermediate. No intervention performed.  - Troponin elevation likely demand ischemia in setting of acute CHF.  - Continue primary prevention with risk factor modification. - Of note, initially started on Aspirin following cath but patient has an allergy to this and has had significant itching. Given patient is also on Eliquis, will stop Aspirin. Will given patient dose of Benadryl.   Paroxysmal Atrial Fibrillation  - Maintaining sinus rhythm.  - Continue Amiodarone 100mg  daily .  - Continue chronic anticoagulation with Eliquis 5mg  twice daily.   Hypertension  - BP well controlled. - Continue Entresto 24-26mg  twice daily. - Initially started on Amlodipine here but will discontinue this given soft BP this morning.  Can consider restarting as outpatient if needed.  Hyperlipidemia  - Lipid panel: Total Cholesterol 222, Triglycerides 161, HDL 68, LDL 122. - LDL goal <70 given CAD. - On Pravastatin at home and started on Crestor 10mg  daily here. Will go ahead and increase Crestor to 20mg  daily now that LDL is above goal.   For questions or updates, please contact [place] Please consult [email or web address] for contact info under        Signed, Darreld Mclean, PA-C  05/21/2019, 11:38 AM       Patient seen and examined. Agree with assessment and plan.  She is breathing much better today.  She appears euvolemic.  No JVD or rales on exam.   Recommend reducing furosemide dose to 40 mg daily with reduction in supplemental potassium.  With her concomitant CAD I would continue amlodipine but will reduce dose to 2.5 mg.  Plan for follow-up laboratory and initial office visit next week.  Low-dose pravastatin changed to rosuvastatin 20 mg for more aggressive lipid management.  She is back on Eliquis.  She is maintaining sinus rhythm.  Will DC aspirin.  Okay for discharge later today.   Troy Sine, MD, [facility] 05/21/2019 1:14 PM

## 2019-05-21 NOTE — Discharge Summary (Signed)
Discharge Summary    Patient ID: Amanda Sellers MRN: [id number]; DOB: February 09, 1932  Admit date: 05/15/2019 Discharge date: 05/21/2019  Primary Care Provider: Tammi Sou, MD  Primary Cardiologist: Glenetta Hew, MD  Primary Electrophysiologist:  None   Discharge Diagnoses    Principal Problem:   Acute on chronic combined systolic and diastolic CHF (congestive heart failure) [facility]) Active Problems:   Coronary artery disease involving native coronary artery of native heart with angina pectoris (HCC)   Elevated troponin   Hypertension   Hyperlipidemia   Paroxysmal atrial fibrillation ([place])   COPD exacerbation ([place])   Pulmonary hypertension, unspecified ([place])    Diagnostic Studies/Procedures    Echocardiogram 05/15/2019: Impressions: 1. Left ventricular ejection fraction, by estimation, is 35 to 40%. The  left ventricle has moderately decreased function. The left ventricle  demonstrates global hypokinesis. There is mild concentric left ventricular  hypertrophy. Left ventricular  diastolic parameters are consistent with Grade II diastolic dysfunction  (pseudonormalization). Elevated left ventricular end-diastolic pressure.  2. Right ventricular systolic function is low normal. The right  ventricular size is normal.  3. Left atrial size was mildly dilated.  4. The mitral valve is degenerative. Mild mitral valve regurgitation.  5. The aortic valve is tricuspid. Aortic valve regurgitation is trivial.  No aortic stenosis is present.  _______________  Right/Left Heart Catheterization 05/19/2019:  1st Diag lesion is 50% stenosed.  Dist LM to Ost LAD lesion is 30% stenosed.  Ramus lesion is 70% stenosed.  Moderate right heart pressure elevation with moderately severe PA systolic pressure elevation at 62 mm with mean pressure at 43 mm. Pulmonary venous hypertension secondary to left-sided heart failure.  Prominent V wave up to 40 mm on PW tracing.  Two-vessel  coronary obstructive disease with 30% ostial LAD stenosis and 50% ostial first diagonal stenosis; 70% stenosis in the proximal ramus intermediate vessel; normal dominant left circumflex coronary artery; normal nondominant RCA.  Recommendations: Guideline directed medical therapy for reduced LV function with echo documentation EF at 35 to 40%. We will add amlodipine with CAD. Resume anticoagulation for PAF in a.m.  Diagnostic Dominance: Left    History of Present Illness     Amanda Sellers is a 86 year old female with a history of chronic diastolic CHF, paroxysmal atrial fibrillation on Amiodarone and Eliquis, COPD, hypertension, hyperlipidemia, hypothyroidism, anemia, and non-Hodgkin's lymphoma treated with chemo (Bendamustine and Rituximab) currently in remission, who is followed by Dr. Bronson Ing who is followed by Dr. Ellyn Hack.   Patient presented to the [facility] ED on 05/15/2019 for further evaluation of shortness of breath.  She reported increasing bilateral leg and feet swelling over the prior week. She denied any exertional chest pain but did note intermittent left arm pain over the prior week both at rest and with exertion. On morning of presentation around 3am, she was woken by acute onset of shortness of breath. She called her son who called 20. When EMS arrived, she was found to be hypoxic and was placed on a non-rebreather mask.   Upon arrival to the ED, she placed on BiPAP. Chest x-ray showed cardiomegaly and significant bilateral pulmonary edema. BNP was only mildly elevated at 138. High-sensitivity troponin elevated at 17 >> 352 >> 319. She was given dose of IV Lasix 80mg  in the ED with improvement but still remained orthopneic. She was admitted and started on IV Heparin. Echo was performed and showed LVEF of 35-40% with grade 2 diastolic dysfunction.    Cardiology was consulted  and decision made to transfer patient to [facility] for right/left cardiac catheterization after  effective diuresis.    Hospital Course     Consultants: Internal Medicine followed along after patient transferred to [facility] under Cardiology service.  Acute on Chronic Combined CHF / Pulmonary Hypertension Echo showed LVEF of 35-40% with global hypokinesis, mild LVH, grade 2 diastolic dysfunction, mild MR, and trivial AI. Patient was diuresed with IV Lasix and then underwent right heart catheterization which showed moderate right heart pressure elevation with moderately severe PA systolic pressure elevation at 62 mmHg (mean pressure at 43 mmHg) as well as pulmonary venous hypertension secondary to left-sided heart failure and prominent V wave up to 40 mmHg on PW tracing. Patient transitioned to PO Lasix on 05/18/2019. Home Valsartan stopped and patient started on low dose Entresto. Net negative 1.4 L this admission. Discharge weight 203 lbs, down from 210 lbs on admission. - Reduce home Lasix to 40mg  daily given Entresto was added. Will continue home K-Dur 20 mEq daily at discharge. - Continue Enresto 24-26mg  twice daily.  - No beta-blocker due to some baseline bradycardia and soft BP at times. - Discussed importance of daily weights and sodium/fluid restrictions. - Will need repeat BMET at follow-up.   CAD with Elevated Troponin  High-sensitivity troponin peaked at 352. Left heart catheterization showed 2-vessel CAD with 30% stenosis of ostial LAD, 50% stenosis of ostial 1st Diag, and 70% stenosis of the proximal ramus intermediate. No intervention performed. Troponin elevation likely demand ischemia in setting of acute CHF.  - Continue primary prevention with risk factor modification. Initially started on Aspirin; however, patient has an allergy to this and had significant itching. Given patient is also on Eliquis, will not discharge patient on Aspirin. - Started on low dose Amlodipine for antianginal effect.  Paroxysmal Atrial Fibrillation  Maintained sinus rhythm. - Continue Amiodarone  100mg  daily.  - Continue chronic anticoagulation with Eliquis 5mg  twice daily.   Hypertension BP well controlled. Soft this morning at 101/57. - Started on Entresto 24-26mg  twice daily and Amlodipine 2.5mg  daily.  Hyperlipidemia   Lipid panel: Total Cholesterol 222, Triglycerides 161, HDL 68, LDL 122.LDL goal <70 given CAD. - Home Pravastatin changed to Crestor 20mg  daily. - Will need repeat lipid panel and LFTs in 6-8 weeks.  Hypothyroidism - Continue home Synthroid.  COPD  - On intermittent home O2 but is not on any routine inhalers.  - Follow-up with PCP.   Patient has been seen and examined by Dr. Claiborne Billings today and determined to be stable for discharge. Outpatient follow-up has been arranged. Medications as below.    Did the patient have an acute coronary syndrome (MI, NSTEMI, STEMI, etc) this admission?:  No.   The elevated Troponin was due to the acute medical illness (demand ischemia).  _____________  Discharge Vitals Blood pressure (!) 101/57, pulse (!) 59, temperature 97.6 F (36.4 C), temperature source Oral, resp. rate 20, height 5\' 6"  (1.676 m), weight 92.3 kg, SpO2 98 %.  Filed Weights   05/19/19 1100 05/20/19 0304 05/21/19 0548  Weight: 91.1 kg 90.8 kg 92.3 kg    Labs & Radiologic Studies    CBC Recent Labs    05/20/19 0338 05/21/19 0545  WBC 3.6* 5.0  HGB 12.5 13.1  HCT 38.3 41.2  MCV 91.6 92.6  PLT 133* [id number]*   Basic Metabolic Panel Recent Labs    05/20/19 0338 05/21/19 0545  NA 140 142  K 3.2* 3.9  CL 101 106  CO2  26 23  GLUCOSE 176* 110*  BUN 20 25*  CREATININE 1.15* 1.12*  CALCIUM 9.1 8.9  MG 2.3 2.4  PHOS  --  4.7*   Liver Function Tests No results for input(s): AST, ALT, ALKPHOS, BILITOT, PROT, ALBUMIN in the last 72 hours. No results for input(s): LIPASE, AMYLASE in the last 72 hours. High Sensitivity Troponin:   Recent Labs  Lab 05/15/19 0450 05/15/19 0755 05/15/19 1148 05/15/19 1256  TROPONINIHS 17 165* 352* 319*     BNP Invalid input(s): POCBNP D-Dimer No results for input(s): DDIMER in the last 72 hours. Hemoglobin A1C No results for input(s): HGBA1C in the last 72 hours. Fasting Lipid Panel Recent Labs    05/21/19 0545  CHOL 222*  HDL 68  LDLCALC 122*  TRIG 161*  CHOLHDL 3.3   Thyroid Function Tests No results for input(s): TSH, T4TOTAL, T3FREE, THYROIDAB in the last 72 hours.  Invalid input(s): FREET3 _____________  CARDIAC CATHETERIZATION  Result Date: 05/19/2019  1st Diag lesion is 50% stenosed.  Dist LM to Ost LAD lesion is 30% stenosed.  Ramus lesion is 70% stenosed.  Moderate right heart pressure elevation with moderately severe PA systolic pressure elevation at 62 mm with mean pressure at 43 mm.  Pulmonary venous hypertension secondary to left-sided heart failure. Prominent V wave up to 40 mm on PW tracing. Two-vessel coronary obstructive disease with 30% ostial LAD stenosis and 50% ostial first diagonal stenosis; 70% stenosis in the proximal ramus intermediate vessel; normal dominant left circumflex coronary artery; normal nondominant RCA. RECOMMENDATION: Guideline directed medical therapy for reduced LV function with echo documentation EF at 35 to 40%.  We will add amlodipine with CAD.  Resume anticoagulation for PAF in a.m.   DG Chest Port 1 View  Result Date: 05/15/2019 CLINICAL DATA:  Shortness of breath. EXAM: PORTABLE CHEST 1 VIEW COMPARISON:  01/26/2017. FINDINGS: Cardiomegaly. New onset of diffuse severe bilateral pulmonary interstitial infiltrates/edema. No prominent pleural effusion or pneumothorax. Sliding hiatal hernia again noted. No acute bony abnormality. IMPRESSION: Cardiomegaly. New onset of diffuse severe bilateral pulmonary interstitial infiltrates/edema noted on today's exam. Electronically Signed   By: Marcello Moores  Register   On: 05/15/2019 05:16   ECHOCARDIOGRAM COMPLETE  Result Date: 05/15/2019    ECHOCARDIOGRAM REPORT   Patient Name:   Amanda Sellers Date of  Exam: 05/15/2019 Medical Rec #:  [id number]         Height:       66.0 in Accession #:    [id number]        Weight:       210.5 lb Date of Birth:  1932-04-03          BSA:          2.044 m Patient Age:    52 years          BP:           122/70 mmHg Patient Gender: F                 HR:           78 bpm. Exam Location:  [facility] Procedure: 2D Echo Indications:    Elevated Troponin  History:        Patient has prior history of Echocardiogram examinations, most                 recent 10/16/2014. CHF, Arrythmias:Atrial Fibrillation,  Signs/Symptoms:Dyspnea; Risk Factors:Hypertension, Dyslipidemia                 and Non-Smoker.  Sonographer:    Leavy Cella RDCS (AE) Referring Phys: [phone] Royanne Foots [place]  1. Left ventricular ejection fraction, by estimation, is 35 to 40%. The left ventricle has moderately decreased function. The left ventricle demonstrates global hypokinesis. There is mild concentric left ventricular hypertrophy. Left ventricular diastolic parameters are consistent with Grade II diastolic dysfunction (pseudonormalization). Elevated left ventricular end-diastolic pressure.  2. Right ventricular systolic function is low normal. The right ventricular size is normal.  3. Left atrial size was mildly dilated.  4. The mitral valve is degenerative. Mild mitral valve regurgitation.  5. The aortic valve is tricuspid. Aortic valve regurgitation is trivial. No aortic stenosis is present. FINDINGS  Left Ventricle: Left ventricular ejection fraction, by estimation, is 35 to 40%. The left ventricle has moderately decreased function. The left ventricle demonstrates global hypokinesis. The left ventricular internal cavity size was normal in size. There is mild concentric left ventricular hypertrophy. Left ventricular diastolic parameters are consistent with Grade II diastolic dysfunction (pseudonormalization). Elevated left ventricular end-diastolic pressure. Right Ventricle: The right  ventricular size is normal. No increase in right ventricular wall thickness. Right ventricular systolic function is low normal. Left Atrium: Left atrial size was mildly dilated. Right Atrium: Right atrial size was normal in size. Pericardium: There is no evidence of pericardial effusion. Mitral Valve: The mitral valve is degenerative in appearance. There is mild thickening of the mitral valve leaflet(s). Mild to moderate mitral annular calcification. Mild mitral valve regurgitation. Tricuspid Valve: The tricuspid valve is grossly normal. Tricuspid valve regurgitation is mild. Aortic Valve: The aortic valve is tricuspid. Aortic valve regurgitation is trivial. Aortic regurgitation PHT measures 382 msec. No aortic stenosis is present. Mild aortic valve annular calcification. Pulmonic Valve: The pulmonic valve was grossly normal. Pulmonic valve regurgitation is mild. Aorta: The aortic root is normal in size and structure. Venous: The inferior vena cava was not well visualized. IAS/Shunts: The interatrial septum was not well visualized.  LEFT VENTRICLE PLAX 2D LVIDd:         4.32 cm  Diastology LVIDs:         3.41 cm  LV e' lateral:   4.87 cm/s LV PW:         1.19 cm  LV E/e' lateral: 24.0 LV IVS:        1.28 cm  LV e' medial:    2.92 cm/s LVOT diam:     1.90 cm  LV E/e' medial:  40.1 LVOT Area:     2.84 cm  RIGHT VENTRICLE RV S prime:     11.30 cm/s TAPSE (M-mode): 1.8 cm LEFT ATRIUM             Index LA diam:        3.20 cm 1.57 cm/m LA Vol (A2C):   90.2 ml 44.13 ml/m LA Vol (A4C):   59.9 ml 29.31 ml/m LA Biplane Vol: 73.7 ml 36.06 ml/m  AORTIC VALVE AI PHT:      382 msec  AORTA Ao Root diam: 3.10 cm MITRAL VALVE MV Area (PHT): 3.43 cm     SHUNTS MV Decel Time: 221 msec     Systemic Diam: 1.90 cm MR Peak grad: 73.6 mmHg MR Mean grad: 43.0 mmHg MR Vmax:      429.00 cm/s MR Vmean:     295.0 cm/s MV E velocity: 117.00 cm/s MV A velocity:  101.00 cm/s MV E/A ratio:  1.16 Kate Sable MD Electronically signed by  Kate Sable MD Signature Date/Time: 05/15/2019/3:10:20 PM    Final    Disposition   Patient is being discharged home today in good condition.  Follow-up Plans & Appointments    Follow-up Information    Lendon Colonel, NP Follow up.   Specialties: Nurse Practitioner, Radiology, Cardiology Why: Hospital follow-up scheduled for 05/29/2019 at 9:15am with Jory Sims, one of Dr. Allison Quarry NPs. Please arrive 15 minute early for check-in. If this date/time does not work for you, please call our office to reschedule. Contact information: [address] [place] [address] [phone]            Discharge Medications   Allergies as of 05/21/2019      Reactions   Aspirin Itching   Iodinated Diagnostic Agents Rash, Other (See Comments)   CT Contrast      Medication List    STOP taking these medications   ALPRAZolam 0.5 MG tablet Commonly known as: XANAX   pravastatin 80 MG tablet Commonly known as: PRAVACHOL   valsartan 40 MG tablet Commonly known as: DIOVAN     TAKE these medications   amiodarone 200 MG tablet Commonly known as: PACERONE Take 0.5 tablets (100 mg total) by mouth daily.   amLODipine 2.5 MG tablet Commonly known as: NORVASC Take 1 tablet (2.5 mg total) by mouth daily.   clonazePAM 0.5 MG tablet Commonly known as: KLONOPIN TAKE 1/2 TO 1 TABLET BY MOUTH TWICE A DAY AS NEEDED What changed:   how much to take  how to take this  when to take this  reasons to take this   cyanocobalamin 1000 MCG/ML injection Commonly known as: (VITAMIN B-12) INJECT 1 ML (1000 MCG TOTAL) INTO THE SKIN EVERY 30 DAYS. What changed:   how much to take  how to take this  when to take this   Eliquis 5 MG Tabs tablet Generic drug: apixaban TAKE 1 TABLET BY MOUTH TWICE A DAY What changed: how much to take   ferrous sulfate 325 (65 FE) MG tablet Take 1 tablet (325 mg total) by mouth 2 (two) times daily with a meal.   fish  oil-omega-3 fatty acids 1000 MG capsule Take 2 g by mouth daily.   furosemide 40 MG tablet Commonly known as: LASIX Take 1 tablet (40 mg total) by mouth daily. What changed:   how much to take  when to take this   levothyroxine 100 MCG tablet Commonly known as: SYNTHROID TAKE 1 TABLET BY MOUTH EVERY DAY BEFORE BREAKFAST What changed: See the new instructions.   OXYGEN Inhale into the lungs. 1Lt at bedtime   Potassium Chloride ER 20 MEQ Tbcr Take 20 mEq by mouth daily.   rosuvastatin 20 MG tablet Commonly known as: CRESTOR Take 1 tablet (20 mg total) by mouth daily. Start taking on: May 22, 2019   sacubitril-valsartan 24-26 MG Commonly known as: ENTRESTO Take 1 tablet by mouth 2 (two) times daily.   traMADol 50 MG tablet Commonly known as: ULTRAM TAKE 1 TABLET BY MOUTH TWICE A DAY AS NEEDED FOR PAIN What changed:   when to take this  additional instructions   Vitamin D (Ergocalciferol) 1.25 MG (50000 UNIT) Caps capsule Commonly known as: DRISDOL TAKE 1 CAPSULE (50,000 UNITS TOTAL) BY MOUTH 2 (TWO) TIMES A WEEK.          Outstanding Labs/Studies   Repeat BMET at follow-up visit. Repeat lipid  panel and LFTs in 6-8 weeks.   Duration of Discharge Encounter   Greater than 30 minutes including physician time.  Signed, Darreld Mclean, PA-C 05/21/2019, 3:20 PM

## 2019-05-21 NOTE — Progress Notes (Signed)
PROGRESS NOTE    Amanda Sellers  [id number] DOB: [birth year] DOA: 05/15/2019 PCP: Tammi Sou, MD   Brief Narrative:  Per HPI: Amanda Oneill Friddleis a 86 y.o.femalewith medical history significant forhypertension, hypothyroidism, CKD stage IIIa, atrial fibrillation on Eliquis, COPD on intermittent home oxygen, OSA with obesity on CPAP not currently using, non-Hodgkin's lymphoma, anemia of chronic disease, dyslipidemia, and diastolic heart failure who presented to the ED with acute onset of shortness of breath that began at approximately 4 AM. Her pulse oximetry at home was approximately 70% and once EMS arrived they had placed her on nonrebreather mask and she was still noted to have saturations in the mid 80th percentile. She had denied any chest pain initially, but now claims that she has had some intermittent substernal chest pain with radiation to her left arm over the last couple weeks. Her legs have demonstrated swelling and she does not check her weight regularly. She claims to be taking her home Lasix 60 mg twice daily and has had good urine output.  Patient was diuresed with IV Lasix at [facility].  Clinically improving.  Transfer to [facility] for cardiac cath on 4/26.  Underwent cardiac cath and found to have post diagonal lesion for percent stenosis, distal left main 30%, ramus 70%.  Found to have significant elevated right atrial pressure and pulmonary hypertension.  Medical management.  Assessment & Plan:   Active Problems:   Acute hypoxemic respiratory failure (HCC)   Acute on chronic congestive heart failure (HCC)   Dilated cardiomyopathy (HCC)   Acute on chronic combined systolic and diastolic CHF (congestive heart failure) (HCC)   Coronary artery disease involving native coronary artery of native heart with angina pectoris ([place])   Acute hypoxemic respiratory failure secondary to acute on chronic diastolic congestive heart failure  decompensation-resolved -Patient on room air, appropriately responded to diuresis. -Continue on home dose Lasix 60 mg twice daily -Fluid restrictionto continue -2D echocardiogramperformed with diminished LVEF of 35-40% noted -Cardiology added amlodipine and Entresto. -She may benefit with sleep study, cardiology to follow-up.  Elevated troponin -Appreciate cardiology recommendations, continue telemetry monitoring -Treated with IV heparin.  Currently off. -Continue pravastatin 80 mg daily and Entresto.   Mild hypokalemia-repleted.  Improved. -Order supplementation and follow with ongoing diuresis  Paroxysmal atrial fibrillation -Currently in sinus rhythm -Resume Eliquis today. -Continue amiodarone  Hypertension Stable.  CKD stage IIIa -Fluctuating levels.  Monitor closely.   -Stable.  Hypothyroidism -Continue levothyroxine -Recent TSH 02/03/2019 of 3.41  Dyslipidemia -Continue statin as noted above  COPD on intermittent home oxygen -No bronchospasms currently noted -We will order breathing treatments as needed  Non-Hodgkin's B-cell lymphoma -Follows with Dr.Sherrill, Oncology  Obesity/OSA -Patient and daughter stated that she was never diagnosed with sleep apnea. She will benefit with sleep studies.   DVT prophylaxis:Eliquis Code Status:Full code Family Communication:2 daughters at the bedside. Disposition Plan: Status is: Inpatient  Remains inpatient appropriate because:Inpatient level of care appropriate due to severity of illness   Dispo: The patient is from: Home              Anticipated d/c is to: Home              Anticipated d/c date is: 1 day              Patient currently is medically stable to discharge home if deemed by cardiology.        Consultants:  Cardiology  Procedures:  2D echocardiogram 4/22  with LVEF 35-40%  Left and right heart cath, results above  Antimicrobials:   None  Subjective: Patient  seen and examined.  Since early morning she is ready to go home.  Her 2 daughters were at bedside.  Objective: Vitals:   05/20/19 0948 05/20/19 2023 05/21/19 0548 05/21/19 0819  BP: 122/66 131/68 (!) 114/59 (!) 101/57  Pulse:  72 (!) 59   Resp:      Temp:  97.7 F (36.5 C) 97.6 F (36.4 C)   TempSrc:  Oral Oral   SpO2:  97% 98%   Weight:   92.3 kg   Height:        Intake/Output Summary (Last 24 hours) at 05/21/2019 1156 Last data filed at 05/21/2019 0535 Gross per 24 hour  Intake 780 ml  Output 1500 ml  Net -720 ml   Filed Weights   05/19/19 1100 05/20/19 0304 05/21/19 0548  Weight: 91.1 kg 90.8 kg 92.3 kg    Examination:  General exam: Appears calm and comfortable, on room air.  Sitting in the side of the bed. Respiratory system: Clear to auscultation. Respiratory effort normal. Cardiovascular system: S1 & S2 heard, RRR. No JVD, murmurs, rubs, gallops or clicks.  No edema.  Gastrointestinal system: Abdomen is nondistended, soft and nontender. No organomegaly or masses felt. Normal bowel sounds heard. Central nervous system: Alert and oriented. No focal neurological deficits. Extremities: Symmetric 5 x 5 power. Skin: No rashes, lesions or ulcers Psychiatry: Judgement and insight appear normal. Mood & affect appropriate.     Data Reviewed: I have personally reviewed following labs and imaging studies  CBC: Recent Labs  Lab 05/15/19 0450 05/16/19 0445 05/17/19 0501 05/17/19 0501 05/18/19 [id number] 05/18/19 [id number] 05/19/19 0416 05/19/19 0416 05/19/19 1559 05/19/19 1602 05/19/19 1604 05/20/19 0338 05/21/19 0545  WBC 5.2   < > 4.4  --  4.4  --  4.2  --   --   --   --  3.6* 5.0  NEUTROABS 2.5  --   --   --   --   --   --   --   --   --   --   --   --   HGB 13.7   < > 12.0   < > 12.1   < > 11.3*   < > 12.9 13.3 13.3 12.5 13.1  HCT 44.9   < > 38.3   < > 38.2   < > 35.9*   < > 38.0 39.0 39.0 38.3 41.2  MCV 94.9   < > 93.4  --  93.9  --  93.5  --   --   --   --  91.6 92.6    PLT 141*   < > 127*  --  133*  --  132*  --   --   --   --  133* 143*   < > = values in this interval not displayed.   Basic Metabolic Panel: Recent Labs  Lab 05/15/19 0450 05/15/19 0450 05/16/19 0445 05/16/19 0445 05/17/19 0501 05/17/19 0501 05/18/19 [id number] 05/18/19 [id number] 05/19/19 1559 05/19/19 1602 05/19/19 1604 05/20/19 0338 05/21/19 0545  NA 141   < > 141   < > 142   < > 140   < > 141 141 141 140 142  K 3.4*   < > 3.9   < > 4.1   < > 4.0   < > 3.6 3.7 3.7 3.2* 3.9  CL 99   < >  101  --  101  --  104  --   --   --   --  101 106  CO2 30   < > 28  --  29  --  26  --   --   --   --  26 23  GLUCOSE 175*   < > 131*  --  128*  --  127*  --   --   --   --  176* 110*  BUN 21   < > 16  --  19  --  20  --   --   --   --  20 25*  CREATININE 1.01*   < > 0.87  --  1.07*  --  0.95  --   --   --   --  1.15* 1.12*  CALCIUM 8.7*   < > 8.8*  --  9.3  --  9.0  --   --   --   --  9.1 8.9  MG 2.6*  --  2.5*  --  2.5*  --  2.6*  --   --   --   --  2.3  --    < > = values in this interval not displayed.   GFR: Estimated Creatinine Clearance: 42.8 mL/min (A) (by C-G formula based on SCr of 1.12 mg/dL (H)). Liver Function Tests: Recent Labs  Lab 05/15/19 0450  AST 53*  ALT 49*  ALKPHOS 52  BILITOT 0.8  PROT 7.0  ALBUMIN 4.3   No results for input(s): LIPASE, AMYLASE in the last 168 hours. No results for input(s): AMMONIA in the last 168 hours. Coagulation Profile: Recent Labs  Lab 05/15/19 1253  INR 1.1   Cardiac Enzymes: No results for input(s): CKTOTAL, CKMB, CKMBINDEX, TROPONINI in the last 168 hours. BNP (last 3 results) No results for input(s): PROBNP in the last 8760 hours. HbA1C: No results for input(s): HGBA1C in the last 72 hours. CBG: No results for input(s): GLUCAP in the last 168 hours. Lipid Profile: Recent Labs    05/21/19 0545  CHOL 222*  HDL 68  LDLCALC 122*  TRIG 161*  CHOLHDL 3.3   Thyroid Function Tests: No results for input(s): TSH, T4TOTAL, FREET4,  T3FREE, THYROIDAB in the last 72 hours. Anemia Panel: No results for input(s): VITAMINB12, FOLATE, FERRITIN, TIBC, IRON, RETICCTPCT in the last 72 hours. Sepsis Labs: No results for input(s): PROCALCITON, LATICACIDVEN in the last 168 hours.  Recent Results (from the past 240 hour(s))  Respiratory Panel by RT PCR (Flu A&B, Covid) - Nasopharyngeal Swab     Status: None   Collection Time: 05/15/19  4:55 AM   Specimen: Nasopharyngeal Swab  Result Value Ref Range Status   SARS Coronavirus 2 by RT PCR NEGATIVE NEGATIVE Final    Comment: (NOTE) SARS-CoV-2 target nucleic acids are NOT DETECTED. The SARS-CoV-2 RNA is generally detectable in upper respiratoy specimens during the acute phase of infection. The lowest concentration of SARS-CoV-2 viral copies this assay can detect is 131 copies/mL. A negative result does not preclude SARS-Cov-2 infection and should not be used as the sole basis for treatment or other patient management decisions. A negative result may occur with  improper specimen collection/handling, submission of specimen other than nasopharyngeal swab, presence of viral mutation(s) within the areas targeted by this assay, and inadequate number of viral copies (<131 copies/mL). A negative result must be combined with clinical observations, patient history, and epidemiological information. The expected result is Negative. Fact  Sheet for Patients:  PinkCheek.be Fact Sheet for Healthcare Providers:  GravelBags.it This test is not yet ap proved or cleared by the [place] FDA and  has been authorized for detection and/or diagnosis of SARS-CoV-2 by FDA under an Emergency Use Authorization (EUA). This EUA will remain  in effect (meaning this test can be used) for the duration of the COVID-19 declaration under Section 564(b)(1) of the Act, 21 U.S.C. section 360bbb-3(b)(1), unless the authorization is terminated or revoked  sooner.    Influenza A by PCR NEGATIVE NEGATIVE Final   Influenza B by PCR NEGATIVE NEGATIVE Final    Comment: (NOTE) The Xpert Xpress SARS-CoV-2/FLU/RSV assay is intended as an aid in  the diagnosis of influenza from Nasopharyngeal swab specimens and  should not be used as a sole basis for treatment. Nasal washings and  aspirates are unacceptable for Xpert Xpress SARS-CoV-2/FLU/RSV  testing. Fact Sheet for Patients: PinkCheek.be Fact Sheet for Healthcare Providers: GravelBags.it This test is not yet approved or cleared by the [place] FDA and  has been authorized for detection and/or diagnosis of SARS-CoV-2 by  FDA under an Emergency Use Authorization (EUA). This EUA will remain  in effect (meaning this test can be used) for the duration of the  Covid-19 declaration under Section 564(b)(1) of the Act, 21  U.S.C. section 360bbb-3(b)(1), unless the authorization is  terminated or revoked. Performed at [facility], [address] [place], [place] [address]          Radiology Studies: CARDIAC CATHETERIZATION  Result Date: 05/19/2019  1st Diag lesion is 50% stenosed.  Dist LM to Ost LAD lesion is 30% stenosed.  Ramus lesion is 70% stenosed.  Moderate right heart pressure elevation with moderately severe PA systolic pressure elevation at 62 mm with mean pressure at 43 mm.  Pulmonary venous hypertension secondary to left-sided heart failure. Prominent V wave up to 40 mm on PW tracing. Two-vessel coronary obstructive disease with 30% ostial LAD stenosis and 50% ostial first diagonal stenosis; 70% stenosis in the proximal ramus intermediate vessel; normal dominant left circumflex coronary artery; normal nondominant RCA. RECOMMENDATION: Guideline directed medical therapy for reduced LV function with echo documentation EF at 35 to 40%.  We will add amlodipine with CAD.  Resume anticoagulation for PAF in a.m.         Scheduled Meds: . amiodarone  100 mg Oral Daily  . amLODipine  5 mg Oral Daily  . apixaban  5 mg Oral BID  . aspirin  81 mg Oral Daily  . ferrous sulfate  325 mg Oral BID WC  . furosemide  40 mg Oral BID  . levothyroxine  100 mcg Oral QAC breakfast  . potassium chloride  40 mEq Oral BID  . rosuvastatin  10 mg Oral Daily  . sacubitril-valsartan  1 tablet Oral BID  . sodium chloride flush  3 mL Intravenous Q12H  . sodium chloride flush  3 mL Intravenous Q12H   Continuous Infusions: . sodium chloride    . sodium chloride       LOS: 6 days    Time spent: 30 minutes    Barb Merino, MD [facility]  If 7PM-7AM, please contact night-coverage [email or web address] 05/21/2019, 11:56 AM

## 2019-05-21 NOTE — Discharge Instructions (Signed)
Medication Changes: - STOP Valsartan and START Entresto 24-26mg  twice daily. - START Amlodipine 2.5mg  daily.  - STOP Pravastatin and START Rosuvastatin 20mg  daily.   Continue all other medications as directed else where on discharge paperwork.   Post Cardiac Catheterization: NO HEAVY LIFTING OR SEXUAL ACTIVITY X 7 DAYS. NO DRIVING X 2-3 DAYS. NO SOAKING BATHS, HOT TUBS, POOLS, ETC., X 7 DAYS.  Radial Site Care: Refer to this sheet in the next few weeks. These instructions provide you with information on caring for yourself after your procedure. Your caregiver may also give you more specific instructions. Your treatment has been planned according to current medical practices, but problems sometimes occur. Call your caregiver if you have any problems or questions after your procedure. HOME CARE INSTRUCTIONS  You may shower the day after the procedure.Remove the bandage (dressing) and gently wash the site with plain soap and water.Gently pat the site dry.   Do not apply powder or lotion to the site.   Do not submerge the affected site in water for 3 to 5 days.   Inspect the site at least twice daily.   Do not flex or bend the affected arm for 24 hours.   No lifting over 5 pounds (2.3 kg) for 5 days after your procedure.   Do not drive home if you are discharged the same day of the procedure. Have someone else drive you.  What to expect:  Any bruising will usually fade within 1 to 2 weeks.   Blood that collects in the tissue (hematoma) may be painful to the touch. It should usually decrease in size and tenderness within 1 to 2 weeks.  SEEK IMMEDIATE MEDICAL CARE IF:  You have unusual pain at the radial site.   You have redness, warmth, swelling, or pain at the radial site.   You have drainage (other than a small amount of blood on the dressing).   You have chills.   You have a fever or persistent symptoms for more than 72 hours.   You have a fever and your symptoms  suddenly get worse.   Your arm becomes pale, cool, tingly, or numb.   You have heavy bleeding from the site. Hold pressure on the site.    Heart Failure Education: 1. Weigh yourself EVERY morning after you go to the bathroom but before you eat or drink anything. Write this number down in a weight log/diary. If you gain 3 pounds overnight or 5 pounds in a week, call the office. 2. Take your medicines as prescribed. If you have concerns about your medications, please call us before you stop taking them.  3. Eat low salt foods--Limit salt (sodium) to 2000 mg per day. This will help prevent your body from holding onto fluid. Read food labels as many processed foods have a lot of sodium, especially canned goods and prepackaged meats. If you would like some assistance choosing low sodium foods, we would be happy to set you up with a nutritionist. 4. Stay as active as you can everyday. Staying active will give you more energy and make your muscles stronger. Start with 5 minutes at a time and work your way up to 30 minutes a day. Break up your activities--do some in the morning and some in the afternoon. Start with 3 days per week and work your way up to 5 days as you can.  If you have chest pain, feel short of breath, dizzy, or lightheaded, STOP. If you don't feel better  after a short rest, call 911. If you do feel better, call the office to let us know you have symptoms with exercise. 5. Limit all fluids for the day to less than 2 liters. Fluid includes all drinks, coffee, juice, ice chips, soup, jello, and all other liquids.

## 2019-05-21 NOTE — Evaluation (Signed)
Physical Therapy Evaluation Patient Details Name: Amanda Sellers MRN: [id number] DOB: February 20, 1932 Today's Date: 05/21/2019   History of Present Illness  Pt is a 86 y/o female with PMH of HTN, CKD stage 3, atrial fib, COPD on intermittent home O2, OSA, non-Hodgkin's lymphoma, anemia, diastolic heart failure, presenting to ED with acute onset of SOB found with SpO2 in 70s. Admitted with acute hypoxemic respiratory failure. S/P cardiac cath 4/26.   Clinical Impression  PTA pt living with son in single story home with ramped entrance. Pt ambulates community distances with RW, is independent with ADLs, does cooking and cleaning, son does shopping although pt still drives. Pt is currently limited in safe mobility by SoB with increased activity. Pt is supervision for transfers and ambulation of 350 feet with RW. Pt with probable drop in SaO2 (mid 80s with poor pleth waveform), provided pt with constant cuing to reduce talking and increase pursed lip breathing with ambulation. Pt educated on need for short bouts of activity throughout the day to build endurance. Pt likely to d/c today and will not need any further PT services at discharge, however PT will continue to follow acutely to progress endurance if d/c is not imminent.      Follow Up Recommendations No PT follow up;Supervision/Assistance - 24 hour    Equipment Recommendations  None recommended by PT(already has what she needs)       Precautions / Restrictions Precautions Precautions: Fall Restrictions Weight Bearing Restrictions: No      Mobility  Bed Mobility               General bed mobility comments: sitting EoB on entry  Transfers Overall transfer level: Needs assistance Equipment used: None Transfers: Sit to/from Stand Sit to Stand: Supervision         General transfer comment: supervision for safety, good power up and self steadying  Ambulation/Gait Ambulation/Gait assistance: Supervision Gait Distance (Feet):  350 Feet Assistive device: Rolling walker (2 wheeled) Gait Pattern/deviations: Step-through pattern;WFL(Within Functional Limits);Trunk flexed;Shuffle Gait velocity: slowed Gait velocity interpretation: 1.31 - 2.62 ft/sec, indicative of limited community ambulator General Gait Details: supervision for safety, vc for decreased talking and pursed lip breathing         Balance Overall balance assessment: Mild deficits observed, not formally tested                                           Pertinent Vitals/Pain Pain Assessment: No/denies pain    Home Living Family/patient expects to be discharged to:: Private residence Living Arrangements: Children Available Help at Discharge: Family;Available 24 hours/day Type of Home: House Home Access: Ramped entrance     Home Layout: One level Home Equipment: Clinical cytogeneticist - 2 wheels;Hand held shower head;Toilet riser      Prior Function Level of Independence: Independent with assistive device(s)         Comments: does bathing and dressing , drives. son does grocery shopping         Extremity/Trunk Assessment   Upper Extremity Assessment Upper Extremity Assessment: Defer to OT evaluation    Lower Extremity Assessment Lower Extremity Assessment: Overall WFL for tasks assessed;Generalized weakness    Cervical / Trunk Assessment Cervical / Trunk Assessment: Kyphotic  Communication   Communication: No difficulties  Cognition Arousal/Alertness: Awake/alert Behavior During Therapy: WFL for tasks assessed/performed Overall Cognitive Status: Within Functional Limits for tasks assessed  General Comments General comments (skin integrity, edema, etc.): pt on RA with SaO2 100%O2 in seated, difficulty with keeping a good pleth waveform with ambulation however SaO2 dropped into mid 80%s, pt very loquacious and requires constant cuing for concentration on her  breathing with increased activity, pt verbalizes understanding        Assessment/Plan    PT Assessment Patient needs continued PT services  PT Problem List Decreased activity tolerance;Cardiopulmonary status limiting activity;Decreased safety awareness       PT Treatment Interventions Functional mobility training;Gait training;Patient/family education;Therapeutic exercise;Therapeutic activities    PT Goals (Current goals can be found in the Care Plan section)  Acute Rehab PT Goals Patient Stated Goal: go home PT Goal Formulation: With patient Time For Goal Achievement: 06/04/19 Potential to Achieve Goals: Good    Frequency Min 3X/week    AM-PAC PT "6 Clicks" Mobility  Outcome Measure Help needed turning from your back to your side while in a flat bed without using bedrails?: None Help needed moving from lying on your back to sitting on the side of a flat bed without using bedrails?: A Little Help needed moving to and from a bed to a chair (including a wheelchair)?: None Help needed standing up from a chair using your arms (e.g., wheelchair or bedside chair)?: None Help needed to walk in hospital room?: None Help needed climbing 3-5 steps with a railing? : A Little 6 Click Score: 22    End of Session Equipment Utilized During Treatment: Gait belt Activity Tolerance: Patient tolerated treatment well Patient left: in bed;with call bell/phone within reach;with family/visitor present Nurse Communication: Mobility status PT Visit Diagnosis: Muscle weakness (generalized) (M62.81)    Time: [id number] PT Time Calculation (min) (ACUTE ONLY): 26 min   Charges:   PT Evaluation $PT Eval Moderate Complexity: 1 Mod PT Treatments $Gait Training: 8-22 mins        Abeer Iversen B. Migdalia Dk PT, DPT Acute Rehabilitation Services Pager [phone] Office [phone]   [place] 05/21/2019, 9:37 AM

## 2019-05-23 ENCOUNTER — Telehealth: Payer: Self-pay

## 2019-05-23 NOTE — Telephone Encounter (Signed)
LM requesting call back to complete TCM and schedule hospital follow up.   

## 2019-05-26 ENCOUNTER — Other Ambulatory Visit: Payer: Self-pay

## 2019-05-26 MED ORDER — POTASSIUM CHLORIDE ER 20 MEQ PO TBCR: EXTENDED_RELEASE_TABLET | ORAL | 1 refills | Status: DC

## 2019-05-26 NOTE — Telephone Encounter (Signed)
Spoke with daughter, [name].  Transition Care Management Follow-up Telephone Call  Admission: 05/15/19-05/21/19 Diagnosis: CHF   How have you been since you were released from the hospital? "She's had a good day". Daughter reports patient is tired but doing well. Checking VS routinely including daily weights. Patient does continue to have rash from ASA and contrast dye, both given while in patient. Taking Benadryl every 6 hours.    Do you understand why you were in the hospital? yes   Do you understand the discharge instructions? yes   Where were you discharged to? Home. Son lives with patient, family rotating shifts.    Items Reviewed:  Medications reviewed: yes  Allergies reviewed: yes  Dietary changes reviewed: yes  Referrals reviewed: yes. Cardiology 05/29/2019.    Functional Questionnaire:   Activities of Daily Living (ADLs):   She states they are independent in the following: ambulation, bathing and hygiene, feeding, continence, grooming, toileting and dressing States they require assistance with the following: None, Someone present with all ADLs currently   Any transportation issues/concerns?: no   Any patient concerns? no   Confirmed importance and date/time of follow-up visits scheduled yes  Provider Appointment booked with PCP 05/30/2019.   Confirmed with patient if condition begins to worsen call PCP or go to the ER.  Patient was given the office number and encouraged to call back with question or concerns.  : yes

## 2019-05-26 NOTE — Telephone Encounter (Signed)
RF request for Potassium 20MEQ LOV:02/03/19 Next ov: 06/10/19 Last written:11/25/14  Pt was on this med while in the hospital but no Rx sent home with her. She was supposed to start on 4/29. Medication currently pending

## 2019-05-26 NOTE — Telephone Encounter (Signed)
Noted: nurse phone contact with patient for TCM. Signed:  Crissie Sickles, MD           05/26/2019

## 2019-05-27 NOTE — Progress Notes (Signed)
Cardiology Office Note   Date:  05/29/2019   ID:  Amanda Sellers, DOB 25-Mar-1932, MRN [id number]  PCP:  Tammi Sou, MD  Cardiologist:  Dr.Harding  No chief complaint on file.    History of Present Illness: Amanda Sellers is a 86 y.o. female who presents for post hospitalization follow up, after admission on 05/15/2019 for acute on chronic combined systolic and diastolic CHF, with hx of CAD, HTN, PAF, HL, COPD and pulmonary hypertension. Initially seen at [facility] ED for significant dyspnea. Placed on BiPAP. She was found to have bilateral pulmonary edema per CXR. Echo was completed revealing EF of 35%-40%. High sensitivity troponin was elevated. She was started on heparin and transferred to [facility] under Hospitalist service.  She was given IV diureses. She had RHC which revealed moderate right heart pressure elevation with moderately severe PA systolic pressure elevation at 62 mmHg (mean pressure at 43 mmHg) as well as pulmonary venous hypertension secondary to left-sided heart failure and prominent V wave up to 40 mmHg on PW tracing.   After diureses, losartan was discontinued and she was transitioned to Entresto 24/25 mg BID. She was diuresed 1.4 liters with a discharge weight of 203 lbs, down from 210 lbs from admission weight. Due to baseline bradycardia and soft BP she was not placed on BB. It was felt that troponin elevation was related to demand ischemia.   She was not started on ASA in the setting of PAF, due to significant allergies. She was continued on amiodarone and started on Eliquis 5 mg BID.  She was also changed to Crestor 20 mg daily from Pravastatin. She had an allergic reaction to IV dye from cath, with significant hives an inflammation.    She is medically compliant and is adhering to a low sodium diet. She denies any chest pain, DOE, dizziness, or myalgia pain.   Past Medical History:  Diagnosis Date  . Anemia of chronic disease   . Anticoagulation adequate,  Eliquis 10/16/2014  . Anxiety    clonaz helps this AND her breathing  . Chronic diastolic congestive heart failure ([place]) 19/50/9326   Complicated by atrial fibrillation; Echo 07/23/14: mild LVH, EF 50-55%, grade 2 diast dysfxn.  NYHA class II.  Marland Kitchen Chronic renal insufficiency, stage III (moderate)    GFR @ 50  . COPD (chronic obstructive pulmonary disease) ([place])    recently noted on Xray, does not have any problems  . Follicular lymphoma ([place])    Non Hodgkins B cell lymphoma; s/p chemo summer 2016--in remission still as of 09/2018: continue obs q 74mo . HLD (hyperlipidemia)     06/2014  . HTN (hypertension)   . Hypothyroidism   . IFG (impaired fasting glucose)   . Lip cancer    Dr. BJanace Hoardexcised this: invasive SCC--no sign of cancer at ENT f/u 10/2015  . Microcytic anemia    transfused 3 U total in hosp 07/2014  . Obesity (BMI 30-39.9)   . Olecranon bursitis of right elbow 02/2015  . [place]   . Persistent atrial fibrillation ([facility])    With RVR; elec cardioversion 11/30/14.  On Amio since 10/2014; Dr. NLenna Gilfordfollowing her from pulm standpoint regarding this med.  .Marland KitchenPositive occult stool blood test 08/08/14   Endoscopies ok 08/2014  . Primary osteoarthritis of both knees 04/2016   Dr. GGladstone Lighter bone on bone--to get euflexxa to both knees.  . Pulmonary metastases ([place] 07/24/2014  . Shortness of breath dyspnea  with exertion.  Dr. Lenna Gilford started her on clonazepam to try to relax her chest wall and allow more satisfying deep breath. (03/2016).  . Vitamin B 12 deficiency   . Vitamin D deficiency    dose increased to 50 K U tab TWICE weekly 04/2017    Past Surgical History:  Procedure Laterality Date  . ABDOMINAL HYSTERECTOMY  1978  . BONE MARROW BIOPSY  07/24/14  . BREAST BIOPSY    . CARDIOVERSION N/A 10/16/2014   Procedure: CARDIOVERSION;  Surgeon: Lelon Perla, MD;  Location: [facility] ENDOSCOPY;  Service: Cardiovascular;  Laterality: N/A;  .  CARDIOVERSION N/A 11/30/2014   Procedure: CARDIOVERSION;  Surgeon: Larey Dresser, MD;  Location: [place];  Service: Cardiovascular;  Laterality: N/A;  . CATARACT EXTRACTION  2020   OU: Dr. Katy Fitch  . COLONOSCOPY  09/23/14   small hemorrhoids, otherwise normal (performed for IDA and heme+ stool)  . COLONOSCOPY    . MASS EXCISION Left 07/15/2015   Left lower lip mass--invasive SCC w/negative margins.  Procedure: EXCISION MASS;  Surgeon: Melissa Montane, MD;  Location: [place];  Service: ENT;  Laterality: Left;  Wedge excision left lower lip mass  . NM MYOVIEW LTD  10/29/2014   medium size mild surgery defect in the mid anterior and apical anterior location suggestive of breast attenuation. No reversibility. LOW RISK  . PFTs  02/2015   Restriction with diffusion defect: cardiologist referred her to pulm to help interpret PFTs and decide whether she has amiodarone toxicity  . RIGHT/LEFT HEART CATH AND CORONARY ANGIOGRAPHY N/A 05/19/2019   Procedure: RIGHT/LEFT HEART CATH AND CORONARY ANGIOGRAPHY;  Surgeon: Troy Sine, MD;  Location: [place] CV LAB;  Service: Cardiovascular;  Laterality: N/A;  . TEE WITHOUT CARDIOVERSION N/A 10/16/2014   Procedure: TRANSESOPHAGEAL ECHOCARDIOGRAM (TEE);  Surgeon: Lelon Perla, MD;  Location: [facility] ENDOSCOPY;  Service: Cardiovascular;  Laterality: N/A;  . TRANSTHORACIC ECHOCARDIOGRAM  07/23/14   mild LVH, EF 50-55%, grade 2 diast dysfxn  . UPPER GI ENDOSCOPY  09/23/2014   small hiatus hernia, nodules in stomach biopsied (chronic active erosive atrophic gastritis with intestinal metaplasia--no dysplasia or malignancy) otherwise normal     Current Outpatient Medications  Medication Sig Dispense Refill  . amiodarone (PACERONE) 200 MG tablet Take 0.5 tablets (100 mg total) by mouth daily. 90 tablet 2  . amLODipine (NORVASC) 2.5 MG tablet Take 1 tablet (2.5 mg total) by mouth daily. 90 tablet 3  . clonazePAM (KLONOPIN) 0.5 MG tablet TAKE 1/2 TO 1 TABLET BY MOUTH  TWICE A DAY AS NEEDED (Patient taking differently: Take 0.25-0.5 mg by mouth 2 (two) times daily as needed. TAKE 1/2 TO 1 TABLET BY MOUTH TWICE A DAY AS NEEDED) 90 tablet 1  . cyanocobalamin (,VITAMIN B-12,) 1000 MCG/ML injection INJECT 1 ML (1000 MCG TOTAL) INTO THE SKIN EVERY 30 DAYS. (Patient taking differently: Inject 1,000 mcg into the skin every 30 (thirty) days. INJECT 1 ML (1000 MCG TOTAL) INTO THE SKIN EVERY 30 DAYS.) 10 mL 0  . ELIQUIS 5 MG TABS tablet TAKE 1 TABLET BY MOUTH TWICE A DAY (Patient taking differently: Take 5 mg by mouth 2 (two) times daily. ) 180 tablet 2  . ferrous sulfate 325 (65 FE) MG tablet Take 1 tablet (325 mg total) by mouth 2 (two) times daily with a meal. 30 tablet 1  . fish oil-omega-3 fatty acids 1000 MG capsule Take 2 g by mouth daily.    . furosemide (LASIX) 40 MG tablet Take  1 tablet (40 mg total) by mouth daily. 30 tablet 2  . levothyroxine (SYNTHROID) 100 MCG tablet TAKE 1 TABLET BY MOUTH EVERY DAY BEFORE BREAKFAST 90 tablet 3  . OXYGEN Inhale into the lungs. 1Lt at bedtime    . potassium chloride 20 MEQ TBCR Take 20 mEq by mouth daily. 30 tablet 3  . Potassium Chloride ER 20 MEQ TBCR 1 tab po qd 90 tablet 1  . rosuvastatin (CRESTOR) 20 MG tablet Take 1 tablet (20 mg total) by mouth daily. 30 tablet 2  . sacubitril-valsartan (ENTRESTO) 24-26 MG Take 1 tablet by mouth 2 (two) times daily. 60 tablet 2  . traMADol (ULTRAM) 50 MG tablet TAKE 1 TABLET BY MOUTH TWICE A DAY AS NEEDED FOR PAIN (Patient taking differently: Take 50 mg by mouth every 12 (twelve) hours as needed. ) 60 tablet 3  . Vitamin D, Ergocalciferol, (DRISDOL) 1.25 MG (50000 UT) CAPS capsule TAKE 1 CAPSULE (50,000 UNITS TOTAL) BY MOUTH 2 (TWO) TIMES A WEEK. 24 capsule 3   No current facility-administered medications for this visit.    Allergies:   Aspirin and Iodinated diagnostic agents    Social History:  The patient  reports that she has never smoked. She has never used smokeless tobacco. She  reports that she does not drink alcohol or use drugs.   Family History:  The patient's family history includes Diabetes in her daughter; Lymphoma in her sister; Melanoma in her mother; Stomach cancer in her father.    ROS: All other systems are reviewed and negative. Unless otherwise mentioned in H&P    PHYSICAL EXAM: VS:  BP (!) 133/59   Pulse 75   Temp (!) 97.3 F (36.3 C)   Resp (!) 22   Ht '5\' 6"'  (1.676 m)   Wt 204 lb 4.8 oz (92.7 kg)   SpO2 95%   BMI 32.97 kg/m  , BMI Body mass index is 32.97 kg/m. GEN: Well nourished, well developed, in no acute distress HEENT: normal Neck: no JVD, carotid bruits, or masses Cardiac: RRR; no murmurs, rubs, or gallops,no edema  Respiratory:  Clear to auscultation bilaterally, normal work of breathing GI: soft, nontender, nondistended, + BS MS: no deformity or atrophy, ambulates with a walker.  Skin: warm and dry, no rash Neuro:  Strength and sensation are intact Psych: euthymic mood, full affect   EKG:  NSR with Left Axis Deviation. LBBB. Rate of 68 bpm.   Recent Labs: 02/03/2019: TSH 3.41 06/02/19: ALT 49; B Natriuretic Peptide 138.0 05/21/2019: BUN 25; Creatinine, Ser 1.12; Hemoglobin 13.1; Magnesium 2.4; Platelets 143; Potassium 3.9; Sodium 142    Lipid Panel    Component Value Date/Time   CHOL 222 (H) 05/21/2019 0545   TRIG 161 (H) 05/21/2019 0545   HDL 68 05/21/2019 0545   CHOLHDL 3.3 05/21/2019 0545   VLDL 32 05/21/2019 0545   LDLCALC 122 (H) 05/21/2019 0545   LDLDIRECT 106.0 05/17/2018 0900      Wt Readings from Last 3 Encounters:  05/29/19 204 lb 4.8 oz (92.7 kg)  05/21/19 203 lb 7.8 oz (92.3 kg)  04/18/19 213 lb 4.8 oz (96.8 kg)      Other studies Reviewed: Echocardiogram Jun 02, 2019: Impressions: 1. Left ventricular ejection fraction, by estimation, is 35 to 40%. The  left ventricle has moderately decreased function. The left ventricle  demonstrates global hypokinesis. There is mild concentric left  ventricular  hypertrophy. Left ventricular  diastolic parameters are consistent with Grade II diastolic dysfunction  (pseudonormalization). Elevated left  ventricular end-diastolic pressure.  2. Right ventricular systolic function is low normal. The right  ventricular size is normal.  3. Left atrial size was mildly dilated.  4. The mitral valve is degenerative. Mild mitral valve regurgitation.  5. The aortic valve is tricuspid. Aortic valve regurgitation is trivial.  No aortic stenosis is present.  _______________  Right/Left Heart Catheterization 05/19/2019:  1st Diag lesion is 50% stenosed.  Dist LM to Ost LAD lesion is 30% stenosed.  Ramus lesion is 70% stenosed.  Moderate right heart pressure elevation with moderately severe PA systolic pressure elevation at 62 mm with mean pressure at 43 mm. Pulmonary venous hypertension secondary to left-sided heart failure.  Prominent V wave up to 40 mm on PW tracing.  Two-vessel coronary obstructive disease with 30% ostial LAD stenosis and 50% ostial first diagonal stenosis; 70% stenosis in the proximal ramus intermediate vessel; normal dominant left circumflex coronary artery; normal nondominant RCA.  Recommendations: Guideline directed medical therapy for reduced LV function with echo documentation EF at 35 to 40%. We will add amlodipine with CAD. Resume anticoagulation for PAF in a.m.  Diagnostic Dominance: Left    Echocardiogram 05/15/2019 1. Left ventricular ejection fraction, by estimation, is 35 to 40%. The  left ventricle has moderately decreased function. The left ventricle  demonstrates global hypokinesis. There is mild concentric left ventricular  hypertrophy. Left ventricular  diastolic parameters are consistent with Grade II diastolic dysfunction  (pseudonormalization). Elevated left ventricular end-diastolic pressure.  2. Right ventricular systolic function is low normal. The right  ventricular size is normal.   3. Left atrial size was mildly dilated.  4. The mitral valve is degenerative. Mild mitral valve regurgitation.  5. The aortic valve is tricuspid. Aortic valve regurgitation is trivial.  No aortic stenosis is present.   ASSESSMENT AND PLAN:  1. Chronic combined systolic and diastolic CHF:  EF of 86%. She is improved with IV diureses while recently hospitalized and continues on furosemide 40 mg daily, and Entresto 24/26 mg.  She appears euvolemic today. Will check BMET.   2. PAF: Continues on Eliquis, not on ASA as she is allergic to this.She remains on Amiodarone. She is in NSR today per EKG. She is without complaints of rapid HR or skipping HR.   3. Hypertension: BP is controlled.  Continue amlodipine.   4. Hyperlipidemia: Continue rosuvastatin 20 mg daily. Follow up lipids and LFTs.   Current medicines are reviewed at length with the patient today.  I have spent 25 minutes dedicated to the care of this patient on the date of this encounter to include pre-visit review of records, assessment, management and diagnostic testing,with shared decision making.  Labs/ tests ordered today include:  BMET, Lipids and LFTs.  Phill Myron. West Pugh, ANP, AACC   05/29/2019 10:14 AM    [address] Office [phone] Fax [phone]  Notice: This dictation was prepared with Dragon dictation along with smaller phrase technology. Any transcriptional errors that result from this process are unintentional and may not be corrected upon review.

## 2019-05-29 ENCOUNTER — Ambulatory Visit: Payer: PPO | Admitting: Adult Health

## 2019-05-29 ENCOUNTER — Other Ambulatory Visit: Payer: Self-pay

## 2019-05-29 ENCOUNTER — Encounter: Payer: Self-pay | Admitting: Adult Health

## 2019-05-29 VITALS — BP 133/59 | HR 75 | Temp 97.3°F | Resp 22 | Ht 66.0 in | Wt 204.3 lb

## 2019-05-29 DIAGNOSIS — Z79899 Other long term (current) drug therapy: Secondary | ICD-10-CM | POA: Diagnosis not present

## 2019-05-29 DIAGNOSIS — I5042 Chronic combined systolic (congestive) and diastolic (congestive) heart failure: Secondary | ICD-10-CM | POA: Diagnosis not present

## 2019-05-29 DIAGNOSIS — I480 Paroxysmal atrial fibrillation: Secondary | ICD-10-CM | POA: Diagnosis not present

## 2019-05-29 DIAGNOSIS — E785 Hyperlipidemia, unspecified: Secondary | ICD-10-CM

## 2019-05-29 DIAGNOSIS — I10 Essential (primary) hypertension: Secondary | ICD-10-CM | POA: Diagnosis not present

## 2019-05-29 DIAGNOSIS — E7800 Pure hypercholesterolemia, unspecified: Secondary | ICD-10-CM | POA: Diagnosis not present

## 2019-05-29 LAB — LIPID PANEL
Chol/HDL Ratio: 3.1 ratio (ref 0.0–4.4)
Cholesterol, Total: 176 mg/dL (ref 100–199)
HDL: 57 mg/dL (ref >39)
LDL Chol Calc (NIH): 86 mg/dL (ref 0–99)
Triglycerides: 196 mg/dL — ABNORMAL HIGH (ref 0–149)
VLDL Cholesterol Cal: 33 mg/dL (ref 5–40)

## 2019-05-29 LAB — BASIC METABOLIC PANEL
BUN/Creatinine Ratio: 16 (ref 12–28)
BUN: 18 mg/dL (ref 8–27)
CO2: 25 mmol/L (ref 20–29)
Calcium: 9.4 mg/dL (ref 8.7–10.3)
Chloride: 102 mmol/L (ref 96–106)
Creatinine, Ser: 1.16 mg/dL — ABNORMAL HIGH (ref 0.57–1.00)
GFR calc Af Amer: 50 mL/min/{1.73_m2} — ABNORMAL LOW (ref >59)
GFR calc non Af Amer: 43 mL/min/{1.73_m2} — ABNORMAL LOW (ref >59)
Glucose: 125 mg/dL — ABNORMAL HIGH (ref 65–99)
Potassium: 4.5 mmol/L (ref 3.5–5.2)
Sodium: 141 mmol/L (ref 134–144)

## 2019-05-29 LAB — HEPATIC FUNCTION PANEL
ALT: 33 IU/L — ABNORMAL HIGH (ref 0–32)
AST: 28 IU/L (ref 0–40)
Albumin: 4.5 g/dL (ref 3.6–4.6)
Alkaline Phosphatase: 51 IU/L (ref 39–117)
Bilirubin Total: 0.5 mg/dL (ref 0.0–1.2)
Bilirubin, Direct: 0.18 mg/dL (ref 0.00–0.40)
Total Protein: 6.4 g/dL (ref 6.0–8.5)

## 2019-05-29 NOTE — Patient Instructions (Signed)
Medication Instructions:  Your physician recommends that you continue on your current medications as directed. Please refer to the Current Medication list given to you today.  *If you need a refill on your cardiac medications before your next appointment, please call your pharmacy*  Lab Work: Your physician recommends that you return for lab work in [place] (LIVER) FUNCTION TEST  If you have labs (blood work) drawn today and your tests are completely normal, you will receive your results only by: Marland Kitchen MyChart Message (if you have MyChart) OR . A paper copy in the mail If you have any lab test that is abnormal or we need to change your treatment, we will call you to review the results.  Testing/Procedures: NONE ordered at this time of appointment   Follow-Up: At [facility], you and your health needs are our priority.  As part of our continuing mission to provide you with exceptional heart care, we have created designated Provider Care Teams.  These Care Teams include your primary Cardiologist (physician) and Advanced Practice Providers (APPs -  Physician Assistants and Nurse Practitioners) who all work together to provide you with the care you need, when you need it.  Your next appointment:   3 month(s)  The format for your next appointment:   In Person  Provider:   Glenetta Hew, MD  Other Instructions:

## 2019-05-30 ENCOUNTER — Encounter: Payer: Self-pay | Admitting: Family Medicine

## 2019-05-30 ENCOUNTER — Ambulatory Visit (INDEPENDENT_AMBULATORY_CARE_PROVIDER_SITE_OTHER): Payer: PPO | Admitting: Family Medicine

## 2019-05-30 VITALS — BP 126/80 | HR 61 | Temp 97.3°F | Resp 19 | Ht 65.0 in | Wt 203.6 lb

## 2019-05-30 DIAGNOSIS — I480 Paroxysmal atrial fibrillation: Secondary | ICD-10-CM

## 2019-05-30 DIAGNOSIS — I5042 Chronic combined systolic (congestive) and diastolic (congestive) heart failure: Secondary | ICD-10-CM | POA: Diagnosis not present

## 2019-05-30 DIAGNOSIS — I10 Essential (primary) hypertension: Secondary | ICD-10-CM | POA: Diagnosis not present

## 2019-05-30 DIAGNOSIS — I2510 Atherosclerotic heart disease of native coronary artery without angina pectoris: Secondary | ICD-10-CM

## 2019-05-30 DIAGNOSIS — J9601 Acute respiratory failure with hypoxia: Secondary | ICD-10-CM | POA: Diagnosis not present

## 2019-05-30 DIAGNOSIS — E7800 Pure hypercholesterolemia, unspecified: Secondary | ICD-10-CM

## 2019-05-30 DIAGNOSIS — I5021 Acute systolic (congestive) heart failure: Secondary | ICD-10-CM

## 2019-05-30 NOTE — Progress Notes (Signed)
05/30/2019  CC:  Chief Complaint  Patient presents with  . Hospitalization Follow-up    Acute on chronic combined systolic and diastolic CHF, Pt states feeling okay. Pt states having only eating toast this morning.    Patient is a 86 y.o. Caucasian female who presents accompanied by her youngest daughter for hospital follow up, specifically [facility] face-to-face visit. Dates hospitalized: 4/22-4/28, 2021. Days since d/c from hospital: 9 days. Patient was discharged from hospital to home. Reason for admission to hospital: acute on chronic combined syst/diast CHF. Date of interactive (phone) contact with patient and/or caregiver: 05/23/19.  I have reviewed patient's discharge summary plus pertinent specific notes, labs, and imaging from the hospitalization.   Pt admitted after acute onset of respiratory distress, hypoxia.   CXR showed new onset of severe pulmonary edema, cardiomegaly.  She has PAF but was not in a-fib. Echo showed EF 35-40% w/global hypokinesis, grd 2 DD, mild MR, inc PA syst pressure.  Cath showed diffuse mildly obstructive CAD but nothing in need of intervention.  No ASA b/c pt on eliquis (plus pt itchy on aspirin?). She maintained NSR. BP was well controlled. LDL 122 so her prava was switched to crestor 63m qd. She is already on intermittent home oxygen for COPD.  She was diuresed and ARB was stopped and she was started on entresto. No BB b/c bradycardia and soft bp.  Na restriction.    Feeling well, no acute complaints. No oxygen needed at home (except for occ with exertion->same as PRIOR to her CHF). Ambulating with walker.  She had f/u with cardiology yesterday and all was stable.  No med changes. BMET showed all stable but her K did go up from 3.9 to 4.5 since starting entresto.  Medication reconciliation was done today and patient is taking meds as recommended by discharging hospitalist/specialist.    ROS: no fevers, no CP, no SOB, no  wheezing, no cough, no dizziness, no HAs, no rashes, no melena/hematochezia.  No polyuria or polydipsia.  No myalgias or arthralgias.  No focal weakness, paresthesias, or tremors.  No acute vision or hearing abnormalities. No n/v/d or abd pain.  No palpitations.    PMH:  Past Medical History:  Diagnosis Date  . Anemia of chronic disease   . Anticoagulation adequate, Eliquis 10/16/2014  . Anxiety    clonaz helps this AND her breathing  . Chronic diastolic congestive heart failure ([place] 035/36/1443  Complicated by atrial fibrillation; Echo 07/23/14: mild LVH, EF 50-55%, grade 2 diast dysfxn.  NYHA class II.  .Marland KitchenChronic renal insufficiency, stage III (moderate)    GFR @ 50  . COPD (chronic obstructive pulmonary disease) ([facility]    recently noted on Xray, does not have any problems  . Follicular lymphoma ([place]    Non Hodgkins B cell lymphoma; s/p chemo summer 2016--in remission still as of 09/2018: continue obs q 616mo. HLD (hyperlipidemia)     06/2014  . HTN (hypertension)   . Hypothyroidism   . IFG (impaired fasting glucose)   . Lip cancer    Dr. ByJanace Hoardxcised this: invasive SCC--no sign of cancer at ENT f/u 10/2015  . Microcytic anemia    transfused 3 U total in hosp 07/2014  . Obesity (BMI 30-39.9)   . Olecranon bursitis of right elbow 02/2015  . Pancytopenia due to antineoplastic chemotherapy ([facility]  . Persistent atrial fibrillation (HCC)    With RVR; elec cardioversion 11/30/14.  On Amio since 10/2014; Dr. NaLenna Gilfordollowing  her from pulm standpoint regarding this med.  Marland Kitchen Positive occult stool blood test 08/08/14   Endoscopies ok 08/2014  . Primary osteoarthritis of both knees 04/2016   Dr. Gladstone Lighter: bone on bone--to get euflexxa to both knees.  . Pulmonary metastases ([place]) 07/24/2014  . Shortness of breath dyspnea    with exertion.  Dr. Lenna Gilford started her on clonazepam to try to relax her chest wall and allow more satisfying deep breath. (03/2016).  . Vitamin B 12 deficiency   . Vitamin D  deficiency    dose increased to 50 K U tab TWICE weekly 04/2017    PSH:  Past Surgical History:  Procedure Laterality Date  . ABDOMINAL HYSTERECTOMY  1978  . BONE MARROW BIOPSY  07/24/14  . BREAST BIOPSY    . CARDIOVERSION N/A 10/16/2014   Procedure: CARDIOVERSION;  Surgeon: Lelon Perla, MD;  Location: [facility] ENDOSCOPY;  Service: Cardiovascular;  Laterality: N/A;  . CARDIOVERSION N/A 11/30/2014   Procedure: CARDIOVERSION;  Surgeon: Larey Dresser, MD;  Location: [place];  Service: Cardiovascular;  Laterality: N/A;  . CATARACT EXTRACTION  2020   OU: Dr. Katy Fitch  . COLONOSCOPY  09/23/14   small hemorrhoids, otherwise normal (performed for IDA and heme+ stool)  . COLONOSCOPY    . MASS EXCISION Left 07/15/2015   Left lower lip mass--invasive SCC w/negative margins.  Procedure: EXCISION MASS;  Surgeon: Melissa Montane, MD;  Location: [place];  Service: ENT;  Laterality: Left;  Wedge excision left lower lip mass  . NM MYOVIEW LTD  10/29/2014   medium size mild surgery defect in the mid anterior and apical anterior location suggestive of breast attenuation. No reversibility. LOW RISK  . PFTs  02/2015   Restriction with diffusion defect: cardiologist referred her to pulm to help interpret PFTs and decide whether she has amiodarone toxicity  . RIGHT/LEFT HEART CATH AND CORONARY ANGIOGRAPHY N/A 05/19/2019   Procedure: RIGHT/LEFT HEART CATH AND CORONARY ANGIOGRAPHY;  Surgeon: Troy Sine, MD;  Location: [place] [facility] LAB;  Service: Cardiovascular;  Laterality: N/A;  . TEE WITHOUT CARDIOVERSION N/A 10/16/2014   Procedure: TRANSESOPHAGEAL ECHOCARDIOGRAM (TEE);  Surgeon: Lelon Perla, MD;  Location: [facility] ENDOSCOPY;  Service: Cardiovascular;  Laterality: N/A;  . TRANSTHORACIC ECHOCARDIOGRAM  07/23/14   mild LVH, EF 50-55%, grade 2 diast dysfxn  . UPPER GI ENDOSCOPY  09/23/2014   small hiatus hernia, nodules in stomach biopsied (chronic active erosive atrophic gastritis with intestinal metaplasia--no  dysplasia or malignancy) otherwise normal    MEDS:  Outpatient Medications Prior to Visit  Medication Sig Dispense Refill  . amiodarone (PACERONE) 200 MG tablet Take 0.5 tablets (100 mg total) by mouth daily. 90 tablet 2  . amLODipine (NORVASC) 2.5 MG tablet Take 1 tablet (2.5 mg total) by mouth daily. 90 tablet 3  . clonazePAM (KLONOPIN) 0.5 MG tablet TAKE 1/2 TO 1 TABLET BY MOUTH TWICE A DAY AS NEEDED (Patient taking differently: Take 0.25-0.5 mg by mouth 2 (two) times daily as needed. TAKE 1/2 TO 1 TABLET BY MOUTH TWICE A DAY AS NEEDED) 90 tablet 1  . cyanocobalamin (,VITAMIN B-12,) 1000 MCG/ML injection INJECT 1 ML (1000 MCG TOTAL) INTO THE SKIN EVERY 30 DAYS. (Patient taking differently: Inject 1,000 mcg into the skin every 30 (thirty) days. INJECT 1 ML (1000 MCG TOTAL) INTO THE SKIN EVERY 30 DAYS.) 10 mL 0  . ELIQUIS 5 MG TABS tablet TAKE 1 TABLET BY MOUTH TWICE A DAY (Patient taking differently: Take 5 mg by mouth  2 (two) times daily. ) 180 tablet 2  . ferrous sulfate 325 (65 FE) MG tablet Take 1 tablet (325 mg total) by mouth 2 (two) times daily with a meal. 30 tablet 1  . fish oil-omega-3 fatty acids 1000 MG capsule Take 2 g by mouth daily.    . furosemide (LASIX) 40 MG tablet Take 1 tablet (40 mg total) by mouth daily. 30 tablet 2  . levothyroxine (SYNTHROID) 100 MCG tablet TAKE 1 TABLET BY MOUTH EVERY DAY BEFORE BREAKFAST 90 tablet 3  . OXYGEN Inhale into the lungs. 1Lt at bedtime    . potassium chloride 20 MEQ TBCR Take 20 mEq by mouth daily. 30 tablet 3  . Potassium Chloride ER 20 MEQ TBCR 1 tab po qd 90 tablet 1  . rosuvastatin (CRESTOR) 20 MG tablet Take 1 tablet (20 mg total) by mouth daily. 30 tablet 2  . sacubitril-valsartan (ENTRESTO) 24-26 MG Take 1 tablet by mouth 2 (two) times daily. 60 tablet 2  . traMADol (ULTRAM) 50 MG tablet TAKE 1 TABLET BY MOUTH TWICE A DAY AS NEEDED FOR PAIN (Patient taking differently: Take 50 mg by mouth every 12 (twelve) hours as needed. ) 60  tablet 3  . Vitamin D, Ergocalciferol, (DRISDOL) 1.25 MG (50000 UT) CAPS capsule TAKE 1 CAPSULE (50,000 UNITS TOTAL) BY MOUTH 2 (TWO) TIMES A WEEK. 24 capsule 3   No facility-administered medications prior to visit.   EXAM:  Vitals with BMI 05/30/2019 05/29/2019 05/21/2019  Height _0  _1  -  Weight 203 lbs 10 oz 204 lbs 5 oz -  BMI 53.64 68.03 -  Systolic 212 248 250  Diastolic 80 59 57  Pulse 61 75 -  oxygen sat on RA today is 94%  Gen: Alert, well appearing.  Patient is oriented to person, place, time, and situation. AFFECT: pleasant, lucid thought and speech. IBB:CWUG: no injection, icteris, swelling, or exudate.  EOMI, PERRLA. Mouth: lips without lesion/swelling.  Oral mucosa pink and moist. Oropharynx without erythema, exudate, or swelling.  CV: RRR, no m/r/g.   LUNGS: CTA bilat, nonlabored resps, good aeration in all lung fields. Abd: soft NT/ND EXT: no clubbing or cyanosis.  2-3 + pitting edema R LL and 1-2 + pitting edema L LL.     Pertinent labs/imaging Lab Results  Component Value Date   TSH 3.41 02/03/2019   Lab Results  Component Value Date   WBC 5.0 05/21/2019   HGB 13.1 05/21/2019   HCT 41.2 05/21/2019   MCV 92.6 05/21/2019   PLT 143 (L) 05/21/2019   Lab Results  Component Value Date   CREATININE 1.16 (H) 05/29/2019   BUN 18 05/29/2019   NA 141 05/29/2019   K 4.5 05/29/2019   CL 102 05/29/2019   CO2 25 05/29/2019   Lab Results  Component Value Date   ALT 33 (H) 05/29/2019   AST 28 05/29/2019   ALKPHOS 51 05/29/2019   BILITOT 0.5 05/29/2019   Lab Results  Component Value Date   CHOL 176 05/29/2019   Lab Results  Component Value Date   HDL 57 05/29/2019   Lab Results  Component Value Date   LDLCALC 86 05/29/2019   Lab Results  Component Value Date   TRIG 196 (H) 05/29/2019   Lab Results  Component Value Date   CHOLHDL 3.1 05/29/2019   Lab Results  Component Value Date   HGBA1C 5.6 11/04/2018   BNP    Component Value Date/Time    BNP 138.0 (H)  05/15/2019 0450   COVID NEG 05/15/19  ASSESSMENT/PLAN:  1) Acute hypoxic RF due to acute systolic CHF. She is stable on new med regimen of entresto 24-26, 1 bid and lasix 47m qd. No BB b/c brady (mild). Renal fxn/lytes stable yesterday. No changes today.  She is aware of need to limit Na, fluids, and to monitor her wt daily.  2) Chronic DD HF +now with new onset systolic HF. EF approx 324%  Continue current med regimen. BP is good. She doesn't appear to have had an MI. She has CAD but nothing was obstructive enough to require intervention.  No ASA b/c ?pt sensitivity + pt on eliquis. I suspect cardiology will want to repeat her echo in a few months: she has f/u with them set for August.  3) PAF: NSR/regular rhythm throughout all of this. Continue amio and eliquis.  4) HLD: LDL down to 86 since prava was switched to 263mcrestor qd. Repeat lipids 3-6 mo.  5) HTN: well controlled. Continue amlodipine and entresto.  Medical decision making of moderate complexity was utilized today.  FOLLOW UP:  4 mo Next cpe due after oct 2021.  Signed:  PhCrissie SicklesMD           05/30/2019

## 2019-06-02 ENCOUNTER — Telehealth: Payer: Self-pay | Admitting: *Deleted

## 2019-06-02 DIAGNOSIS — I5042 Chronic combined systolic (congestive) and diastolic (congestive) heart failure: Secondary | ICD-10-CM

## 2019-06-02 NOTE — Telephone Encounter (Signed)
-----   Message from Lendon Colonel, NP sent at 06/02/2019  7:02 AM EDT ----- Regarding: FW: Cardiac rehab clearance Please refer to pulmonary rehab. Thank you.   Curt Bears  ----- Message ----- From: Leonie Man, MD Sent: 06/02/2019  12:42 AM EDT To: Lendon Colonel, NP Subject: RE: Cardiac rehab clearance                    I think she probably would be a good candidate for pulmonary rehab.  She does have diastolic heart failure.  I would be happy to cosign a referral for ambulatory referral for pulmonary rehab.  Glenetta Hew, MD ----- Message ----- From: Lendon Colonel, NP Sent: 06/01/2019  10:00 AM EDT To: Leonie Man, MD Subject: Melton Alar: Cardiac rehab clearance                     ----- Message ----- From: Rowe Pavy, RN Sent: 05/30/2019   9:50 AM EDT To: Lendon Colonel, NP, Jacqulynn Cadet, CMA Subject: RE: Cardiac rehab clearance                    Pt does not have a referral for cardiac rehab.  Looking at the office note I do not see a diagnosis that medicare plan would pay for cardiac rehab.  But she could do pulmonary rehab with the diagnosis of pulmonary hypertension or diastolic heart failure (EF is too high for cardiac/medicare plan) If you feel this is appropriate, please have Dr. Ellyn Hack (must be MD cosign) place "ambulatory referral for pulmonary rehab".  If you run into trouble finding the order, let me know - it is a bit tricky to find - unfortunately My number is [phone]  Thanks [place], BSN Cardiac and Pulmonary Rehab Nurse Navigator  ----- Message ----- From: Magda Kiel, RN Sent: 05/29/2019   2:58 PM EDT To: Rowe Pavy, RN, Jacqulynn Cadet, CMA Subject: RE: Cardiac rehab clearance                    Thanks for the referral! Will forward to [facility] our nurse navigator!  Have a good afternoon! ----- Message ----- From: Jacqulynn Cadet, CMA Sent: 05/29/2019   2:46 PM EDT To: Magda Kiel, RN,  Lendon Colonel, NP Subject: Cardiac rehab clearance                        Good afternoon Artemio Aly NP saw this patient in office today and cleared her for cardiac rehab clearance.    Thanks,  Ellison Carwin

## 2019-06-02 NOTE — Telephone Encounter (Signed)
Amb referral placed for pulmonologist

## 2019-06-10 ENCOUNTER — Ambulatory Visit: Payer: PPO | Admitting: Family Medicine

## 2019-06-16 ENCOUNTER — Telehealth: Payer: Self-pay

## 2019-06-16 NOTE — Telephone Encounter (Signed)
Patient called in stating that pharmacy has informed her that her mom the patient is going to need a PA for her medication   sacubitril-valsartan (ENTRESTO) 24-26 MG  Patients daughter is worried if this a medication that her mom can just stop and be without she is worried about her mom not having the medication. Patient also wanted to know if samples were avaible to the patient until PA went through    Please call and advise

## 2019-06-16 NOTE — Telephone Encounter (Signed)
PA approved. Pharmacy notified and pt's daughter advised as well.

## 2019-06-16 NOTE — Telephone Encounter (Signed)
Patient has refills available for both medications at [facility] transition pharmacy. Will call to see if Rx can be transferred first and if not, send in new prescriptions to [facility]

## 2019-06-16 NOTE — Telephone Encounter (Signed)
PA completed and currently waiting on response. Daughter is concerned about mom not taking for 1-2 days. Please advise, thanks.

## 2019-06-16 NOTE — Telephone Encounter (Signed)
Called pharmacy to see if prescriptions could be transferred, Amanda Sellers verified they could and sent to [facility]. Pt's daughter called and notified.

## 2019-06-16 NOTE — Telephone Encounter (Signed)
PA sent via covermymed on 06/16/19   Key: [id number]    Medication: Delene Loll 24-26mg    Dx: I50.2   Per Dr. Anitra Lauth pt has tried and failed n/a   Waiting for response.   PA approved, pharmacy contacted and pt's daughter as well.

## 2019-06-16 NOTE — Telephone Encounter (Signed)
She will likely be fine to go a few days without the entresto. We don't have any samples of any medications. Sometimes the pharmacy will give the patient 2-3 pills until prior auth goes through. Has she asked them for this?  -thx

## 2019-06-16 NOTE — Telephone Encounter (Signed)
Patient called in wanting to get refill on medication that was previous filled at hospital upon discharged. She is currently out of the medications    rosuvastatin (CRESTOR) 20 MG tablet  sacubitril-valsartan (ENTRESTO) 24-26 MG   [id number] - [place], [place] - [address]  Please call and advise  [place]

## 2019-06-18 ENCOUNTER — Telehealth: Payer: Self-pay | Admitting: *Deleted

## 2019-06-18 NOTE — Telephone Encounter (Signed)
Pulmonology appointment schedule on 07/29/19,daughter is aware of the date and time,letter mailed.

## 2019-07-09 ENCOUNTER — Other Ambulatory Visit: Payer: Self-pay

## 2019-07-09 MED ORDER — ROSUVASTATIN CALCIUM 20 MG PO TABS: 20.0000 mg | ORAL_TABLET | Freq: Every day | ORAL | 0 refills | Status: DC

## 2019-07-09 NOTE — Telephone Encounter (Signed)
This is Dr. Harding's pt. °

## 2019-07-29 ENCOUNTER — Other Ambulatory Visit: Payer: Self-pay

## 2019-07-29 ENCOUNTER — Ambulatory Visit: Payer: PPO | Admitting: Critical Care Medicine

## 2019-07-29 ENCOUNTER — Encounter: Payer: Self-pay | Admitting: Critical Care Medicine

## 2019-07-29 VITALS — BP 160/80 | HR 91 | Ht 66.0 in | Wt 198.6 lb

## 2019-07-29 DIAGNOSIS — R0600 Dyspnea, unspecified: Secondary | ICD-10-CM | POA: Diagnosis not present

## 2019-07-29 DIAGNOSIS — I5022 Chronic systolic (congestive) heart failure: Secondary | ICD-10-CM

## 2019-07-29 DIAGNOSIS — J9611 Chronic respiratory failure with hypoxia: Secondary | ICD-10-CM

## 2019-07-29 DIAGNOSIS — R0609 Other forms of dyspnea: Secondary | ICD-10-CM

## 2019-07-29 NOTE — Patient Instructions (Addendum)
Thank you for visiting Dr. Carlis Abbott at [facility]. We recommend the following: Orders Placed This Encounter  Procedures  . Pulse oximetry, overnight  . Pulmonary function test   Orders Placed This Encounter  Procedures  . Pulse oximetry, overnight    On RA    Standing Status:   Future    Standing Expiration Date:   07/28/2020  . Pulmonary function test    Standing Status:   Future    Standing Expiration Date:   07/28/2020    Order Specific Question:   Where should this test be performed?    Answer:   [facility]    Order Specific Question:   Full PFT: includes the following: basic spirometry, spirometry pre & post bronchodilator, diffusion capacity (DLCO), lung volumes    Answer:   FULL PFT Without spirometry post bronchodilator    No orders of the defined types were placed in this encounter.   Return in about 2 months (around 09/29/2019).  After PFTs.   Please do your part to reduce the spread of COVID-19.

## 2019-07-29 NOTE — Progress Notes (Signed)
Synopsis: Referred in July 2021 for COPD by Lendon Colonel, NP.  Formerly a patient of Dr. Lenna Gilford.  Subjective:   PATIENT ID: Lenn Cal Matteucci GENDER: female DOB: 05-27-1932, MRN: [id number]  Chief Complaint  Patient presents with  . Consult    Patient has shortness of breath with exertion. Was admitted to hospital in April. Denies cough. Patient wears 1 liters oxygen at night.    Ms. Scovell is an 86 y/o woman with a history of HFrEF, paroxysmal atrial fibrillation on chronic amiodarone who presents for evaluation of shortness of breath.  She is accompanied today by her daughter [name].  She is formerly a patient of Dr. Lenna Gilford, last seen in 2019.  She was followed for chronic dyspnea on exertion and mild restriction due to body habitus and reduced DLCO on PFTs, but no deterioration on amiodarone. She continues on amiodarone 100 mg daily.  She was admitted for newly diagnosed acute systolic heart failure.  She underwent heart catheterization but had no amenable sites for PCI.  She was started on Entresto. Her recent discharge summary from April 2021 reports a diagnosis of COPD, which her previous office notes and PFTs do not support. Since that hospitalization she has only had mild lower extremity edema and PND twice.  She has chronic dyspnea on exertion that limits her activity, but her significant knee arthritis is more limiting.  She can walk around short distances without dyspnea on exertion, but is short of breath when she is walking to the pulmonary office.  She denies wheezing, coughing, shortness of breath at rest.  There is no family history of lung disease.  She weighs herself daily and has been losing weight since hospitalization.  She is a never smoker/ vaper.  She has been on supplemental oxygen at night since 2016, which was prescribed by her oncologist.  She does not use it every night, only notes that she is short of breath.  She has never had overnight oximetry.     Past  Medical History:  Diagnosis Date  . Anemia of chronic disease   . Anticoagulation adequate, Eliquis 10/16/2014  . Anxiety    clonaz helps this AND her breathing  . Chronic diastolic congestive heart failure ([place]) 95/18/8416   Complicated by atrial fibrillation; Echo 07/23/14: mild LVH, EF 50-55%, grade 2 diast dysfxn.  NYHA class II.  Marland Kitchen Chronic renal insufficiency, stage III (moderate)    GFR @ 50  . COPD (chronic obstructive pulmonary disease) ([place])    recently noted on Xray, does not have any problems  . Follicular lymphoma ([place])    Non Hodgkins B cell lymphoma; s/p chemo summer 2016--in remission still as of 09/2018: continue obs q 69mo . HLD (hyperlipidemia)     06/2014  . HTN (hypertension)   . Hypothyroidism   . IFG (impaired fasting glucose)   . Lip cancer    Dr. BJanace Hoardexcised this: invasive SCC--no sign of cancer at ENT f/u 10/2015  . Microcytic anemia    transfused 3 U total in hosp 07/2014  . Obesity (BMI 30-39.9)   . Olecranon bursitis of right elbow 02/2015  . Pancytopenia due to antineoplastic chemotherapy ([place]   . Persistent atrial fibrillation (HCC)    With RVR; elec cardioversion 11/30/14.  On Amio since 10/2014; Dr. NLenna Gilfordfollowing her from pulm standpoint regarding this med.  .Marland KitchenPositive occult stool blood test 08/08/14   Endoscopies ok 08/2014  . Primary osteoarthritis of both knees 04/2016   Dr.  Gioffre: bone on bone--to get euflexxa to both knees.  . Pulmonary metastases ([place]) 07/24/2014  . Shortness of breath dyspnea    with exertion.  Dr. Lenna Gilford started her on clonazepam to try to relax her chest wall and allow more satisfying deep breath. (03/2016).  . Vitamin B 12 deficiency   . Vitamin D deficiency    dose increased to 50 K U tab TWICE weekly 04/2017     Family History  Problem Relation Age of Onset  . Melanoma Mother   . Stomach cancer Father   . Lymphoma Sister   . Diabetes Daughter      Past Surgical History:  Procedure Laterality Date  . ABDOMINAL  HYSTERECTOMY  1978  . BONE MARROW BIOPSY  07/24/14  . BREAST BIOPSY    . CARDIOVERSION N/A 10/16/2014   Procedure: CARDIOVERSION;  Surgeon: Lelon Perla, MD;  Location[facility];  Service: Cardiovascular;  Laterality: N/A;  . CARDIOVERSION N/A 11/30/2014   Procedure: CARDIOVERSION;  Surgeon: Larey Dresser, MD;  Location: [facility];  Service: Cardiovascular;  Laterality: N/A;  . CATARACT EXTRACTION  2020   OU: Dr. Katy Fitch  . COLONOSCOPY  09/23/14   small hemorrhoids, otherwise normal (performed for IDA and heme+ stool)  . COLONOSCOPY    . MASS EXCISION Left 07/15/2015   Left lower lip mass--invasive SCC w/negative margins.  Procedure: EXCISION MASS;  Surgeon: Melissa Montane, MD;  Location: [facility];  Service: ENT;  Laterality: Left;  Wedge excision left lower lip mass  . NM MYOVIEW LTD  10/29/2014   medium size mild surgery defect in the mid anterior and apical anterior location suggestive of breast attenuation. No reversibility. LOW RISK  . PFTs  02/2015   Restriction with diffusion defect: cardiologist referred her to pulm to help interpret PFTs and decide whether she has amiodarone toxicity  . RIGHT/LEFT HEART CATH AND CORONARY ANGIOGRAPHY N/A 05/19/2019   Procedure: RIGHT/LEFT HEART CATH AND CORONARY ANGIOGRAPHY;  Surgeon: Troy Sine, MD;  Location: [facility] CV LAB;  Service: Cardiovascular;  Laterality: N/A;  . TEE WITHOUT CARDIOVERSION N/A 10/16/2014   Procedure: TRANSESOPHAGEAL ECHOCARDIOGRAM (TEE);  Surgeon: Lelon Perla, MD;  Location: [facility] ENDOSCOPY;  Service: Cardiovascular;  Laterality: N/A;  . TRANSTHORACIC ECHOCARDIOGRAM  07/23/14   mild LVH, EF 50-55%, grade 2 diast dysfxn  . UPPER GI ENDOSCOPY  09/23/2014   small hiatus hernia, nodules in stomach biopsied (chronic active erosive atrophic gastritis with intestinal metaplasia--no dysplasia or malignancy) otherwise normal    Social History   Socioeconomic History  . Marital status: Married    Spouse name: Not on file    . Number of children: 8  . Years of education: Not on file  . Highest education level: Not on file  Occupational History  . Occupation: retired  Tobacco Use  . Smoking status: Never Smoker  . Smokeless tobacco: Never Used  Vaping Use  . Vaping Use: Never used  Substance and Sexual Activity  . Alcohol use: No    Alcohol/week: 0.0 standard drinks  . Drug use: No  . Sexual activity: Not on file  Other Topics Concern  . Not on file  Social History Narrative   Widowed, has 8 children.   Orig from [place], now lives in [place].   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   No tob/alc/drugs.   Social Determinants of Health   Financial Resource Strain:   . Difficulty of Paying  Living Expenses:   Food Insecurity:   . Worried About Programme researcher, broadcasting/film/video in the Last Year:   . Barista in the Last Year:   Transportation Needs:   . Freight forwarder (Medical):   Marland Kitchen Lack of Transportation (Non-Medical):   Physical Activity:   . Days of Exercise per Week:   . Minutes of Exercise per Session:   Stress:   . Feeling of Stress :   Social Connections:   . Frequency of Communication with Friends and Family:   . Frequency of Social Gatherings with Friends and Family:   . Attends Religious Services:   . Active Member of Clubs or Organizations:   . Attends Banker Meetings:   Marland Kitchen Marital Status:   Intimate Partner Violence:   . Fear of Current or Ex-Partner:   . Emotionally Abused:   Marland Kitchen Physically Abused:   . Sexually Abused:      Allergies  Allergen Reactions  . Aspirin Itching  . Iodinated Diagnostic Agents Rash and Other (See Comments)    CT Contrast     Immunization History  Administered Date(s) Administered  . Fluad Quad(high Dose 65+) 10/14/2018  . Influenza, High Dose Seasonal PF 11/11/2013, 11/06/2016  . Influenza,inj,Quad PF,6+ Mos 10/27/2014, 10/04/2015, 10/05/2017  . Influenza-Unspecified 10/21/2012  .  Moderna SARS-COVID-2 Vaccination 02/04/2019, 03/17/2019  . Pneumococcal Conjugate-13 02/07/2016  . Pneumococcal-Unspecified 01/23/2014  . Tdap 12/09/2014  . Zoster 03/19/2012    Outpatient Medications Prior to Visit  Medication Sig Dispense Refill  . amiodarone (PACERONE) 200 MG tablet Take 0.5 tablets (100 mg total) by mouth daily. 90 tablet 2  . amLODipine (NORVASC) 2.5 MG tablet Take 1 tablet (2.5 mg total) by mouth daily. 90 tablet 3  . clonazePAM (KLONOPIN) 0.5 MG tablet TAKE 1/2 TO 1 TABLET BY MOUTH TWICE A DAY AS NEEDED (Patient taking differently: Take 0.25-0.5 mg by mouth 2 (two) times daily as needed. TAKE 1/2 TO 1 TABLET BY MOUTH TWICE A DAY AS NEEDED) 90 tablet 1  . cyanocobalamin (,VITAMIN B-12,) 1000 MCG/ML injection INJECT 1 ML (1000 MCG TOTAL) INTO THE SKIN EVERY 30 DAYS. (Patient taking differently: Inject 1,000 mcg into the skin every 30 (thirty) days. INJECT 1 ML (1000 MCG TOTAL) INTO THE SKIN EVERY 30 DAYS.) 10 mL 0  . ELIQUIS 5 MG TABS tablet TAKE 1 TABLET BY MOUTH TWICE A DAY (Patient taking differently: Take 5 mg by mouth 2 (two) times daily. ) 180 tablet 2  . ferrous sulfate 325 (65 FE) MG tablet Take 1 tablet (325 mg total) by mouth 2 (two) times daily with a meal. 30 tablet 1  . fish oil-omega-3 fatty acids 1000 MG capsule Take 2 g by mouth daily.    . furosemide (LASIX) 40 MG tablet Take 1 tablet (40 mg total) by mouth daily. 30 tablet 2  . levothyroxine (SYNTHROID) 100 MCG tablet TAKE 1 TABLET BY MOUTH EVERY DAY BEFORE BREAKFAST 90 tablet 3  . OXYGEN Inhale into the lungs. 1Lt at bedtime    . potassium chloride 20 MEQ TBCR Take 20 mEq by mouth daily. 30 tablet 3  . Potassium Chloride ER 20 MEQ TBCR 1 tab po qd 90 tablet 1  . rosuvastatin (CRESTOR) 20 MG tablet Take 1 tablet (20 mg total) by mouth daily. 90 tablet 0  . sacubitril-valsartan (ENTRESTO) 24-26 MG Take 1 tablet by mouth 2 (two) times daily. 60 tablet 2  . traMADol (ULTRAM) 50 MG tablet TAKE 1 TABLET  BY  MOUTH TWICE A DAY AS NEEDED FOR PAIN (Patient taking differently: Take 50 mg by mouth every 12 (twelve) hours as needed. ) 60 tablet 3  . Vitamin D, Ergocalciferol, (DRISDOL) 1.25 MG (50000 UT) CAPS capsule TAKE 1 CAPSULE (50,000 UNITS TOTAL) BY MOUTH 2 (TWO) TIMES A WEEK. 24 capsule 3   No facility-administered medications prior to visit.    Review of Systems  Constitutional: Negative for chills and fever.  Respiratory: Positive for shortness of breath. Negative for cough and wheezing.   Cardiovascular: Positive for leg swelling and PND. Negative for chest pain.  Gastrointestinal: Negative.   Neurological: Negative for focal weakness.  Endo/Heme/Allergies: Negative for environmental allergies. Does not bruise/bleed easily.     Objective:   Vitals:   07/29/19 1023  BP: (!) 160/80  Pulse: 91  SpO2: 96%  Weight: 198 lb 9.6 oz (90.1 kg)  Height: _0  (1.676 m)   96% on  RA BMI Readings from Last 3 Encounters:  07/29/19 32.05 kg/m  05/30/19 33.88 kg/m  05/29/19 32.97 kg/m   Wt Readings from Last 3 Encounters:  07/29/19 198 lb 9.6 oz (90.1 kg)  05/30/19 203 lb 9.6 oz (92.4 kg)  05/29/19 204 lb 4.8 oz (92.7 kg)    Physical Exam Vitals reviewed.  Constitutional:      General: She is not in acute distress.    Appearance: She is obese. She is not ill-appearing.  HENT:     Head: Normocephalic and atraumatic.  Eyes:     General: No scleral icterus. Cardiovascular:     Rate and Rhythm: Normal rate and regular rhythm.     Heart sounds: No murmur heard.   Pulmonary:     Comments: Breathing comfortably on room air, no conversational dyspnea.  Clear to auscultation bilaterally. Abdominal:     General: There is no distension.     Palpations: Abdomen is soft.  Musculoskeletal:        General: No deformity.     Cervical back: Neck supple.     Comments: Dependent bilateral lower extremity edema  Lymphadenopathy:     Cervical: No cervical adenopathy.  Skin:    General:  Skin is warm.     Findings: No rash.  Neurological:     General: No focal deficit present.     Mental Status: She is alert.     Coordination: Coordination normal.     Comments: Ambulates with a walker  Psychiatric:        Mood and Affect: Mood normal.        Behavior: Behavior normal.      CBC    Component Value Date/Time   WBC 5.0 05/21/2019 0545   RBC 4.45 05/21/2019 0545   HGB 13.1 05/21/2019 0545   HGB 12.0 04/18/2019 1037   HGB 10.6 (L) 10/09/2016 1146   HCT 41.2 05/21/2019 0545   HCT 33.0 (L) 10/09/2016 1146   PLT 143 (L) 05/21/2019 0545   PLT 127 (L) 04/18/2019 1037   PLT 184 10/09/2016 1146   MCV 92.6 05/21/2019 0545   MCV 81.6 10/09/2016 1146   MCH 29.4 05/21/2019 0545   MCHC 31.8 05/21/2019 0545   RDW 14.5 05/21/2019 0545   RDW 16.7 (H) 10/09/2016 1146   LYMPHSABS 2.1 05/15/2019 0450   LYMPHSABS 0.8 (L) 10/09/2016 1146   MONOABS 0.5 05/15/2019 0450   MONOABS 0.6 10/09/2016 1146   EOSABS 0.1 05/15/2019 0450   EOSABS 0.1 10/09/2016 1146   BASOSABS 0.0 05/15/2019 0450  BASOSABS 0.0 10/09/2016 1146    CHEMISTRY No results for input(s): NA, K, CL, CO2, GLUCOSE, BUN, CREATININE, CALCIUM, MG, PHOS in the last 168 hours. CrCl cannot be calculated (Patient's most recent lab result is older than the maximum 21 days allowed.).   Chest Imaging- films reviewed: CXR, 1 view 05/15/2019-increased markings throughout the lungs bilaterally, most notable in the bases.  In comparison to 01/26/2017 chest x-ray, all findings are acute.  CTA chest 08/07/2014-innumerable subcentimeter pulmonary nodules, tiny left dependent effusions.  No emphysema.  No PE.  Pulmonary Functions Testing Results: PFT Results Latest Ref Rng & Units 08/08/2016 02/26/2015  FVC-Pre L 1.75 2.12  FVC-Predicted Pre % 63 74  FVC-Post L 1.98 2.19  FVC-Predicted Post % 71 77  Pre FEV1/FVC % % 77 80  Post FEV1/FCV % % 74 80  FEV1-Pre L 1.36 1.70  FEV1-Predicted Pre % 65 80  FEV1-Post L 1.46 1.76  DLCO  UNC% % 60 56  DLCO COR %Predicted % 94 82  TLC L 3.82 5.41  TLC % Predicted % 71 101  RV % Predicted % 75 136   2018-no significant obstruction.  Unable to assess bronchodilator reversibility given inadequate spirometry post-BD.  Mild restriction, moderate diffusion impairment.   Echocardiogram 05/15/2019: LVEF 35 to 40%, global hypokinesis, mild concentric LVH with grade 2 diastolic dysfunction.  Elevated LVEDP.  Low normal RV function.  Mildly dilated LA, normal RA.  Degenerative MV with mild MR.  Trivial AR.  Heart Catheterization 05/19/2019:     1st Diag lesion is 50% stenosed.  Dist LM to Ost LAD lesion is 30% stenosed.  Ramus lesion is 70% stenosed.   Moderate right heart pressure elevation with moderately severe PA systolic pressure elevation at 62 mm with mean pressure at 43 mm.  Pulmonary venous hypertension secondary to left-sided heart failure.  Prominent V wave up to 40 mm on PW tracing.  Two-vessel coronary obstructive disease with 30% ostial LAD stenosis and 50% ostial first diagonal stenosis; 70% stenosis in the proximal ramus intermediate vessel; normal dominant left circumflex coronary artery; normal nondominant RCA.  RECOMMENDATION:  Guideline directed medical therapy for reduced LV function with echo documentation EF at 35 to 40%.  We will add amlodipine with CAD.     Assessment & Plan:     ICD-10-CM   1. Chronic HFrEF (heart failure with reduced ejection fraction) (HCC)  I50.22 Pulmonary function test    Pulse oximetry, overnight  2. DOE (dyspnea on exertion)  R06.00 Pulmonary function test    Pulse oximetry, overnight  3. Chronic respiratory failure with hypoxia (HCC)  J96.11     Chronic DOE, likely due to chronic HFrEF- NYHA III. -Repeat PFTs to evaluate for accelerated decline that would be explained by amiodarone.  Previously this has not been seen. -Encouraged ongoing compliance with cardiology medications, fluid restriction, daily  weights. -Continue regular physical activity as tolerated.  Chronic respiratory failure with nocturnal hypoxia -ONO on room air to determine if she continues to require oxygen  RTC in 2 months after PFTs.    Current Outpatient Medications:  .  amiodarone (PACERONE) 200 MG tablet, Take 0.5 tablets (100 mg total) by mouth daily., Disp: 90 tablet, Rfl: 2 .  amLODipine (NORVASC) 2.5 MG tablet, Take 1 tablet (2.5 mg total) by mouth daily., Disp: 90 tablet, Rfl: 3 .  clonazePAM (KLONOPIN) 0.5 MG tablet, TAKE 1/2 TO 1 TABLET BY MOUTH TWICE A DAY AS NEEDED (Patient taking differently: Take 0.25-0.5 mg by mouth 2 (  two) times daily as needed. TAKE 1/2 TO 1 TABLET BY MOUTH TWICE A DAY AS NEEDED), Disp: 90 tablet, Rfl: 1 .  cyanocobalamin (,VITAMIN B-12,) 1000 MCG/ML injection, INJECT 1 ML (1000 MCG TOTAL) INTO THE SKIN EVERY 30 DAYS. (Patient taking differently: Inject 1,000 mcg into the skin every 30 (thirty) days. INJECT 1 ML (1000 MCG TOTAL) INTO THE SKIN EVERY 30 DAYS.), Disp: 10 mL, Rfl: 0 .  ELIQUIS 5 MG TABS tablet, TAKE 1 TABLET BY MOUTH TWICE A DAY (Patient taking differently: Take 5 mg by mouth 2 (two) times daily. ), Disp: 180 tablet, Rfl: 2 .  ferrous sulfate 325 (65 FE) MG tablet, Take 1 tablet (325 mg total) by mouth 2 (two) times daily with a meal., Disp: 30 tablet, Rfl: 1 .  fish oil-omega-3 fatty acids 1000 MG capsule, Take 2 g by mouth daily., Disp: , Rfl:  .  furosemide (LASIX) 40 MG tablet, Take 1 tablet (40 mg total) by mouth daily., Disp: 30 tablet, Rfl: 2 .  levothyroxine (SYNTHROID) 100 MCG tablet, TAKE 1 TABLET BY MOUTH EVERY DAY BEFORE BREAKFAST, Disp: 90 tablet, Rfl: 3 .  OXYGEN, Inhale into the lungs. 1Lt at bedtime, Disp: , Rfl:  .  potassium chloride 20 MEQ TBCR, Take 20 mEq by mouth daily., Disp: 30 tablet, Rfl: 3 .  Potassium Chloride ER 20 MEQ TBCR, 1 tab po qd, Disp: 90 tablet, Rfl: 1 .  rosuvastatin (CRESTOR) 20 MG tablet, Take 1 tablet (20 mg total) by mouth daily.,  Disp: 90 tablet, Rfl: 0 .  sacubitril-valsartan (ENTRESTO) 24-26 MG, Take 1 tablet by mouth 2 (two) times daily., Disp: 60 tablet, Rfl: 2 .  traMADol (ULTRAM) 50 MG tablet, TAKE 1 TABLET BY MOUTH TWICE A DAY AS NEEDED FOR PAIN (Patient taking differently: Take 50 mg by mouth every 12 (twelve) hours as needed. ), Disp: 60 tablet, Rfl: 3 .  Vitamin D, Ergocalciferol, (DRISDOL) 1.25 MG (50000 UT) CAPS capsule, TAKE 1 CAPSULE (50,000 UNITS TOTAL) BY MOUTH 2 (TWO) TIMES A WEEK., Disp: 24 capsule, Rfl: 3     Julian Hy, DO [facility] 07/29/2019 10:42 AM

## 2019-08-04 ENCOUNTER — Other Ambulatory Visit: Payer: Self-pay | Admitting: Student

## 2019-08-20 ENCOUNTER — Encounter: Payer: Self-pay | Admitting: Family Medicine

## 2019-08-20 ENCOUNTER — Other Ambulatory Visit: Payer: Self-pay

## 2019-08-20 ENCOUNTER — Ambulatory Visit (INDEPENDENT_AMBULATORY_CARE_PROVIDER_SITE_OTHER): Payer: PPO | Admitting: Family Medicine

## 2019-08-20 VITALS — BP 163/72 | HR 82 | Temp 98.2°F | Resp 16 | Ht 66.0 in | Wt 200.0 lb

## 2019-08-20 DIAGNOSIS — I872 Venous insufficiency (chronic) (peripheral): Secondary | ICD-10-CM | POA: Diagnosis not present

## 2019-08-20 DIAGNOSIS — R609 Edema, unspecified: Secondary | ICD-10-CM

## 2019-08-20 MED ORDER — FLUTICASONE PROPIONATE 0.05 % EX CREA: TOPICAL_CREAM | CUTANEOUS | 2 refills | Status: DC

## 2019-08-20 NOTE — Progress Notes (Signed)
OFFICE VISIT  08/20/2019   CC:  Chief Complaint  Patient presents with  . R LL redness and swelling    mid calf to ankle region, x 3 days.    HPI:    Patient is a 86 y.o. Caucasian female who presents for R LL redness and swelling. Four days ago started seeing some redness on front of R LL.  No pain or itching. Has had 1 mo hx of dime-sized blackish spot of skin R LL--question of origination from an insect bite (?). No pain or itching. She does have underlying chronic LE venous insufficiency with approx 2+ pitting bilat. She eats a strict low Na diet. Hx of pulm venous HTN from LV HF. She insists that past use of compression stockings made her legs swell worse.   Home bp's 130/78 typically.  Today's measurement is here is unusual for her.   Past Medical History:  Diagnosis Date  . Anemia of chronic disease   . Anticoagulation adequate, Eliquis 10/16/2014  . Anxiety    clonaz helps this AND her breathing  . Chronic diastolic congestive heart failure ([place]) 75/10/2583   Complicated by atrial fibrillation; Echo 07/23/14: mild LVH, EF 50-55%, grade 2 diast dysfxn.  NYHA class II.  Marland Kitchen Chronic renal insufficiency, stage III (moderate)    GFR @ 50  . COPD (chronic obstructive pulmonary disease) ([place])    recently noted on Xray, does not have any problems  . Follicular lymphoma ([place])    Non Hodgkins B cell lymphoma; s/p chemo summer 2016--in remission still as of 09/2018: continue obs q 58mo . HLD (hyperlipidemia)     06/2014  . HTN (hypertension)   . Hypothyroidism   . IFG (impaired fasting glucose)   . Lip cancer    Dr. BJanace Hoardexcised this: invasive SCC--no sign of cancer at ENT f/u 10/2015  . Microcytic anemia    transfused 3 U total in hosp 07/2014  . Obesity (BMI 30-39.9)   . Olecranon bursitis of right elbow 02/2015  . Pancytopenia due to antineoplastic chemotherapy [place] [place]   . Persistent atrial fibrillation ([facility])    With RVR; elec cardioversion 11/30/14.  On Amio since 10/2014;  Dr. NLenna Gilfordfollowing her from pulm standpoint regarding this med.  .Marland KitchenPositive occult stool blood test 08/08/14   Endoscopies ok 08/2014  . Primary osteoarthritis of both knees 04/2016   Dr. GGladstone Lighter bone on bone--to get euflexxa to both knees.  . Pulmonary metastases ([place] 07/24/2014  . Shortness of breath dyspnea    with exertion.  Dr. NLenna Gilfordstarted her on clonazepam to try to relax her chest wall and allow more satisfying deep breath. (03/2016).  . Vitamin B 12 deficiency   . Vitamin D deficiency    dose increased to 50 K U tab TWICE weekly 04/2017    Past Surgical History:  Procedure Laterality Date  . ABDOMINAL HYSTERECTOMY  1978  . BONE MARROW BIOPSY  07/24/14  . BREAST BIOPSY    . CARDIOVERSION N/A 10/16/2014   Procedure: CARDIOVERSION;  Surgeon: BLelon Perla MD;  [facility];  Service: Cardiovascular;  Laterality: N/A;  . CARDIOVERSION N/A 11/30/2014   Procedure: CARDIOVERSION;  Surgeon: DLarey Dresser MD;  [place] [facility]  Service: Cardiovascular;  Laterality: N/A;  . CATARACT EXTRACTION  2020   OU: Dr. GKaty Fitch . COLONOSCOPY  09/23/14   small hemorrhoids, otherwise normal (performed for IDA and heme+ stool)  . COLONOSCOPY    . MASS EXCISION Left 07/15/2015  Left lower lip mass--invasive SCC w/negative margins.  Procedure: EXCISION MASS;  Surgeon: Melissa Montane, MD;  Location: [facility];  Service: ENT;  Laterality: Left;  Wedge excision left lower lip mass  . NM MYOVIEW LTD  10/29/2014   medium size mild surgery defect in the mid anterior and apical anterior location suggestive of breast attenuation. No reversibility. LOW RISK  . PFTs  02/2015   Restriction with diffusion defect: cardiologist referred her to pulm to help interpret PFTs and decide whether she has amiodarone toxicity  . RIGHT/LEFT HEART CATH AND CORONARY ANGIOGRAPHY N/A 05/19/2019   Procedure: RIGHT/LEFT HEART CATH AND CORONARY ANGIOGRAPHY;  Surgeon: Troy Sine, MD;  Location: [facility];   Service: Cardiovascular;  Laterality: N/A;  . TEE WITHOUT CARDIOVERSION N/A 10/16/2014   Procedure: TRANSESOPHAGEAL ECHOCARDIOGRAM (TEE);  Surgeon: Lelon Perla, MD;  Location: [facility]COPY;  Service: Cardiovascular;  Laterality: N/A;  . TRANSTHORACIC ECHOCARDIOGRAM  07/23/14   mild LVH, EF 50-55%, grade 2 diast dysfxn  . UPPER GI ENDOSCOPY  09/23/2014   small hiatus hernia, nodules in stomach biopsied (chronic active erosive atrophic gastritis with intestinal metaplasia--no dysplasia or malignancy) otherwise normal    Outpatient Medications Prior to Visit  Medication Sig Dispense Refill  . amiodarone (PACERONE) 200 MG tablet Take 0.5 tablets (100 mg total) by mouth daily. 90 tablet 2  . amLODipine (NORVASC) 2.5 MG tablet Take 1 tablet (2.5 mg total) by mouth daily. 90 tablet 3  . clonazePAM (KLONOPIN) 0.5 MG tablet TAKE 1/2 TO 1 TABLET BY MOUTH TWICE A DAY AS NEEDED (Patient taking differently: Take 0.25-0.5 mg by mouth 2 (two) times daily as needed. TAKE 1/2 TO 1 TABLET BY MOUTH TWICE A DAY AS NEEDED) 90 tablet 1  . cyanocobalamin (,VITAMIN B-12,) 1000 MCG/ML injection INJECT 1 ML (1000 MCG TOTAL) INTO THE SKIN EVERY 30 DAYS. (Patient taking differently: Inject 1,000 mcg into the skin every 30 (thirty) days. INJECT 1 ML (1000 MCG TOTAL) INTO THE SKIN EVERY 30 DAYS.) 10 mL 0  . ELIQUIS 5 MG TABS tablet TAKE 1 TABLET BY MOUTH TWICE A DAY (Patient taking differently: Take 5 mg by mouth 2 (two) times daily. ) 180 tablet 2  . ENTRESTO 24-26 MG TAKE 1 TABLET BY MOUTH 2 TIMES DAILY 60 tablet 1  . ferrous sulfate 325 (65 FE) MG tablet Take 1 tablet (325 mg total) by mouth 2 (two) times daily with a meal. 30 tablet 1  . fish oil-omega-3 fatty acids 1000 MG capsule Take 2 g by mouth daily.    . furosemide (LASIX) 40 MG tablet Take 1 tablet (40 mg total) by mouth daily. 30 tablet 2  . levothyroxine (SYNTHROID) 100 MCG tablet TAKE 1 TABLET BY MOUTH EVERY DAY BEFORE BREAKFAST 90 tablet 3  . OXYGEN Inhale  into the lungs. 1Lt at bedtime    . potassium chloride 20 MEQ TBCR Take 20 mEq by mouth daily. 30 tablet 3  . Potassium Chloride ER 20 MEQ TBCR 1 tab po qd 90 tablet 1  . rosuvastatin (CRESTOR) 20 MG tablet Take 1 tablet (20 mg total) by mouth daily. 90 tablet 0  . traMADol (ULTRAM) 50 MG tablet TAKE 1 TABLET BY MOUTH TWICE A DAY AS NEEDED FOR PAIN (Patient taking differently: Take 50 mg by mouth every 12 (twelve) hours as needed. ) 60 tablet 3  . Vitamin D, Ergocalciferol, (DRISDOL) 1.25 MG (50000 UT) CAPS capsule TAKE 1 CAPSULE (50,000 UNITS TOTAL) BY MOUTH 2 (TWO)  TIMES A WEEK. 24 capsule 3   No facility-administered medications prior to visit.    Allergies  Allergen Reactions  . Aspirin Itching  . Iodinated Diagnostic Agents Rash and Other (See Comments)    CT Contrast    ROS As per HPI  PE: Vitals with BMI 08/20/2019 07/29/2019 05/30/2019  Height '5\' 6"'  '5\' 6"'  '5\' 5"'   Weight 200 lbs 198 lbs 10 oz 203 lbs 10 oz  BMI 32.3 89.38 10.17  Systolic 510 258 527  Diastolic 72 80 80  Pulse 82 91 61  O2 sat 95% on RA today  Gen: Alert, well appearing.  Patient is oriented to person, place, time, and situation. AFFECT: pleasant, lucid thought and speech. LEGS: bilat LL 2-3 + pitting edema, very mild diffuse erythematous "speckly" rash in anterior tibial portion of R LL.  No streaking, no heat, no tenderness.  No vesicles or pustules, no weeping. Dime sized dry/scab lesion R LL anterolateral surface w/out drainage, fluctuance, or erythema.  Non-tender.  LABS:    Chemistry      Component Value Date/Time   NA 141 05/29/2019 1031   NA 143 11/17/2014 0801   K 4.5 05/29/2019 1031   K 3.6 11/17/2014 0801   CL 102 05/29/2019 1031   CO2 25 05/29/2019 1031   CO2 30 (H) 11/17/2014 0801   BUN 18 05/29/2019 1031   BUN 16.3 11/17/2014 0801   CREATININE 1.16 (H) 05/29/2019 1031   CREATININE 0.86 12/09/2014 0934   CREATININE 0.7 11/17/2014 0801      Component Value Date/Time   CALCIUM 9.4  05/29/2019 1031   CALCIUM 8.9 11/17/2014 0801   ALKPHOS 51 05/29/2019 1031   ALKPHOS 70 11/17/2014 0801   AST 28 05/29/2019 1031   AST 32 11/17/2014 0801   ALT 33 (H) 05/29/2019 1031   ALT 30 11/17/2014 0801   BILITOT 0.5 05/29/2019 1031   BILITOT 0.55 11/17/2014 0801     Lab Results  Component Value Date   WBC 5.0 05/21/2019   HGB 13.1 05/21/2019   HCT 41.2 05/21/2019   MCV 92.6 05/21/2019   PLT 143 (L) 05/21/2019   Lab Results  Component Value Date   TSH 3.41 02/03/2019   Lab Results  Component Value Date   HGBA1C 5.6 11/04/2018    IMPRESSION AND PLAN:  Bilat LL edema: combination of venous insufficiency and pulm venous htn from her LV HF. Now has some R LL pretibial stasis dermatitis.  No sign of cellulitis. Discussed cutivate 0.05% cream bid prn, compression stockings (rx given), and elevation of legs above level of heart 20 min bid. She will continue low Na diet and all current cardiac meds.  An After Visit Summary was printed and given to the patient.  FOLLOW UP: Return if symptoms worsen or fail to improve.  Signed:  Crissie Sickles, MD           08/20/2019

## 2019-09-01 ENCOUNTER — Ambulatory Visit: Payer: PPO | Admitting: Cardiology

## 2019-09-01 ENCOUNTER — Encounter: Payer: Self-pay | Admitting: Cardiology

## 2019-09-01 ENCOUNTER — Other Ambulatory Visit: Payer: Self-pay

## 2019-09-01 VITALS — BP 128/70 | HR 69 | Ht 66.0 in | Wt 202.6 lb

## 2019-09-01 DIAGNOSIS — I2722 Pulmonary hypertension due to left heart disease: Secondary | ICD-10-CM | POA: Diagnosis not present

## 2019-09-01 DIAGNOSIS — I5042 Chronic combined systolic (congestive) and diastolic (congestive) heart failure: Secondary | ICD-10-CM

## 2019-09-01 DIAGNOSIS — I420 Dilated cardiomyopathy: Secondary | ICD-10-CM

## 2019-09-01 DIAGNOSIS — I2510 Atherosclerotic heart disease of native coronary artery without angina pectoris: Secondary | ICD-10-CM

## 2019-09-01 DIAGNOSIS — J9601 Acute respiratory failure with hypoxia: Secondary | ICD-10-CM | POA: Diagnosis not present

## 2019-09-01 DIAGNOSIS — I10 Essential (primary) hypertension: Secondary | ICD-10-CM | POA: Diagnosis not present

## 2019-09-01 DIAGNOSIS — E782 Mixed hyperlipidemia: Secondary | ICD-10-CM

## 2019-09-01 DIAGNOSIS — I480 Paroxysmal atrial fibrillation: Secondary | ICD-10-CM

## 2019-09-01 NOTE — Progress Notes (Signed)
Primary Care Provider: Tammi Sou, MD Cardiologist: Glenetta Hew, MD Electrophysiologist: None  Clinic Note: Chief Complaint  Patient presents with  . Follow-up    Feeling pretty good  . Atrial Fibrillation    Seems to be maintaining sinus rhythm on amiodarone  . Congestive Heart Failure    Recent hospital stay for CHF, now EF is down    HPI:    Amanda Sellers is a 86 y.o. female with a PMH notable for labile hypertension PAF, & new Dx of HFrEF (previously noted as HFpEF) along with COPD (on supplemental oxygen at night since 2016) who presents today for 38-monthfollow-up  I last saw Amanda Sellers November 01, 2018 for annual follow-up.  She had been seen by DRoderic Palau NP in the A. fib clinic earlier that year noted hypertension.  She increased Lasix to 60 mg twice daily.  She stated this definitely helped her breathing, swelling and orthopnea-no longer sleeping in the clinic on her.  No sense of being in A. fib or not.  She noted getting short of breath if she does too much.  She was mostly using a rolling walker, and unable to walk very far.  Energy better but not great.  Not sleeping well.  Was on amiodarone and therefore beta-blocker was held.  On Eliquis plus.  A. fib clinic on February 16 -> she indicated that she was feeling great.  No further sensation of A. fib.  Remaining on the amiodarone.  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  No bleeding issues.  Recent Hospitalizations:   May 15, 2019-acute on chronic combined CHF with pulmonary venous hypertension.  Initially on BiPAP.  IV diuresis.  Transition to Entresto -> noted to be 35-40%.  No beta-blocker with bradycardia-continued on amiodarone..  On Eliquis --> IV diuresis-ending dry weight 203 pounds.  S/p Bilateral Cataract Sgx  Amanda Sellers was last seen on 05/29/2019 by KJory Sims NP for hospital follow-up: Doing well.  Relatively stable.  No more symptoms of  dyspnea.  No changes made.  Referral made to pulmonary medicine  Reviewed  CV studies:    The following studies were reviewed today: (if available, images/films reviewed: From Epic Chart or Care Everywhere) . Echo 05/15/2019: EF 35-40%.  Moderately reduced function.  (Previous EF was 55 to 60%) global HK.  GRII DD.  Mild LA dilation.  Normal RV size. .Marland KitchenRLCP 05/19/2019: EF 35-40%. D1 50%, RI 70%, dLM-LAD 30%.  mPAP 43 mmHg - (62/19 mmHg), PCWP  28 mmHg (V wave 41 mmHg) LVEDP 34 mmHg Diagnostic Dominance: Left    Interval History:   Amanda Sellers seen now for follow-up.  She is.  She still has her chronic lower extremity edema that is pretty stable.  It does go down when she puts her feet up at night and is not associated PND orthopnea.  (She recently saw Dr. MAnitra Lauthfor sore on the right lower leg).  She tends to think this compression stockings make her swelling worse, but does still use them. -->  She indicates that she is trying to watch a low-salt diet.  From a cardiac standpoint she is not really noticing any PND or orthopnea.  She has her baseline dyspnea and wears a nighttime oxygen.  She is not very good about really exercising, but is always active.  She does her chores around the house including laundry and vacuuming.  She may had to  sit down intermittently, but has not had any chest pain or pressure.  No worse than usual dyspnea.  In fact she is feeling better.  She denies any rapid irregular heartbeats or palpitations.  She has had dizziness with change in position.  Has had times where she felt like she is in the fall but not sure if this is because of balance issues with her back and knee pain or because of dizziness.  CV Review of Symptoms (Summary) Cardiovascular ROS: positive for - dyspnea on exertion, edema, shortness of breath and All of his symptoms are relatively stable with no exacerbation.  She is limited in activity because of musculoskeletal issues more so than  dyspnea. negative for - chest pain, irregular heartbeat, orthopnea, palpitations, paroxysmal nocturnal dyspnea, rapid heart rate or Syncope/near syncope or TIA/amaurosis fugax, claudication; melena, hematochezia, hematuria or epistaxis.  Recent falls.  The patient does not have symptoms concerning for COVID-19 infection (fever, chills, cough, or new shortness of breath).  The patient is practicing social distancing & Masking.   She had her COVID-19 vaccine injections in February and March  REVIEWED OF SYSTEMS   Review of Systems  Constitutional: Negative for malaise/fatigue (She says her energy level is okay) and weight loss (Maintaining stable weight.).  HENT: Negative for congestion and nosebleeds.   Respiratory: Positive for cough, shortness of breath and wheezing (Intermittently, but not recently).   Cardiovascular: Positive for leg swelling.  Gastrointestinal: Negative for blood in stool and melena.  Genitourinary: Negative for hematuria.  Musculoskeletal: Positive for back pain and joint pain. Negative for falls (She does have an unsteady gait, but as long she uses a walker she is usually stable) and myalgias.  Neurological: Positive for dizziness (Sometimes positional but also has poor balance) and weakness (Legs are weak). Negative for focal weakness.  Psychiatric/Behavioral: The patient has insomnia (She has trouble going back to sleep when she wakes up).    I have reviewed and (if needed) personally updated the patient's problem list, medications, allergies, past medical and surgical history, social and family history.   PAST MEDICAL HISTORY   Past Medical History:  Diagnosis Date  . Anemia of chronic disease   . Anticoagulation adequate, Eliquis 10/16/2014  . Anxiety    clonaz helps this AND her breathing  . Chronic combined systolic and diastolic congestive heart failure ([place]) 00/92/3300   Complicated by atrial fibrillation; Echo 07/23/14: mild LVH, EF 50-55%, grade 2 diast  dysfxn.;  Echo May 15, 2019: EF 35 to 40%, GRII DD.  (In setting of CHF exacerbation, class III)-> baseline class II symptoms  . Chronic renal insufficiency, stage III (moderate)    GFR @ 50  . COPD (chronic obstructive pulmonary disease) ([place])    recently noted on Xray, does not have any problems  . Follicular lymphoma ([place])    Non Hodgkins B cell lymphoma; s/p chemo summer 2016--in remission still as of 09/2018: continue obs q 71mo . HLD (hyperlipidemia)     06/2014  . HTN (hypertension)   . Hypothyroidism   . IFG (impaired fasting glucose)   . Lip cancer    Dr. BJanace Hoardexcised this: invasive SCC--no sign of cancer at ENT f/u 10/2015  . Microcytic anemia    transfused 3 U total in hosp 07/2014  . Obesity (BMI 30-39.9)   . Olecranon bursitis of right elbow 02/2015  . Pancytopenia due to antineoplastic chemotherapy ([place]   . Persistent atrial fibrillation (HCC)    With RVR; elec cardioversion  11/30/14.  On Amio since 10/2014; Dr. Lenna Gilford following her from pulm standpoint regarding this med.  Marland Kitchen Positive occult stool blood test 08/08/14   Endoscopies ok 08/2014  . Primary osteoarthritis of both knees 04/2016   Dr. Gladstone Lighter: bone on bone--to get euflexxa to both knees.  . Pulmonary hypertension due to left heart disease ([place])    RLCP 05/19/2019: EF 35-40%. D1 50%, RI 70%, dLM-LAD 30%.  mPAP 43 mmHg - (62/19 mmHg), PCWP  28 mmHg (V wave 41 mmHg) LVEDP 34 mmHg  . Pulmonary metastases ([place]) 07/24/2014  . Vitamin B 12 deficiency   . Vitamin D deficiency    dose increased to 50 K U tab TWICE weekly 04/2017    PAST SURGICAL HISTORY   Past Surgical History:  Procedure Laterality Date  . ABDOMINAL HYSTERECTOMY  1978  . BONE MARROW BIOPSY  07/24/14  . BREAST BIOPSY    . CARDIOVERSION N/A 10/16/2014   Procedure: CARDIOVERSION;  Surgeon: Lelon Perla, MD;  Location[facility] ENDOSCOPY;  Service: Cardiovascular;  Laterality: N/A;  . CARDIOVERSION N/A 11/30/2014   Procedure: CARDIOVERSION;  Surgeon: Larey Dresser, MD;  Location: [place];  Service: Cardiovascular;  Laterality: N/A;  . CATARACT EXTRACTION  2020   OU: Dr. Katy Fitch  . COLONOSCOPY  09/23/14   small hemorrhoids, otherwise normal (performed for IDA and heme+ stool)  . COLONOSCOPY    . MASS EXCISION Left 07/15/2015   Left lower lip mass--invasive SCC w/negative margins.  Procedure: EXCISION MASS;  Surgeon: Melissa Montane, MD;  Location: [facility];  Service: ENT;  Laterality: Left;  Wedge excision left lower lip mass  . NM MYOVIEW LTD  10/29/2014   medium size mild surgery defect in the mid anterior and apical anterior location suggestive of breast attenuation. No reversibility. LOW RISK  . PFTs  02/2015   Restriction with diffusion defect: cardiologist referred her to pulm to help interpret PFTs and decide whether she has amiodarone toxicity  . RIGHT/LEFT HEART CATH AND CORONARY ANGIOGRAPHY N/A 05/19/2019   Procedure: RIGHT/LEFT HEART CATH AND CORONARY ANGIOGRAPHY;  Surgeon: Troy Sine, MD;  Location: [place] CV LAB;; EF 35-40%. D1 50%, RI 70%, dLM-LAD 30%.  mPAP 43 mmHg - (62/19 mmHg), PCWP  28 mmHg (V wave 41 mmHg) LVEDP 34 mmHg  . TEE WITHOUT CARDIOVERSION N/A 10/16/2014   Procedure: TRANSESOPHAGEAL ECHOCARDIOGRAM (TEE);  Surgeon: Lelon Perla, MD;  Location: [facility] ENDOSCOPY;  Service: Cardiovascular;  Laterality: N/A;  . TRANSTHORACIC ECHOCARDIOGRAM  07/23/14   mild LVH, EF 50-55%, grade 2 diast dysfxn  . TRANSTHORACIC ECHOCARDIOGRAM  05/15/2019   Acute CHF exacerbation:  EF 35-40%.  Moderately reduced function.  (Previous EF was 55 to 60%) global HK.  GRII DD.  Mild LA dilation.  Normal RV size.  Marland Kitchen UPPER GI ENDOSCOPY  09/23/2014   small hiatus hernia, nodules in stomach biopsied (chronic active erosive atrophic gastritis with intestinal metaplasia--no dysplasia or malignancy) otherwise normal    MEDICATIONS/ALLERGIES   Current Meds  Medication Sig  . amiodarone (PACERONE) 200 MG tablet Take 0.5 tablets (100 mg total) by mouth  daily.  Marland Kitchen amLODipine (NORVASC) 2.5 MG tablet Take 1 tablet (2.5 mg total) by mouth daily.  . clonazePAM (KLONOPIN) 0.5 MG tablet TAKE 1/2 TO 1 TABLET BY MOUTH TWICE A DAY AS NEEDED (Patient taking differently: Take 0.25-0.5 mg by mouth 2 (two) times daily as needed. TAKE 1/2 TO 1 TABLET BY MOUTH TWICE A DAY AS NEEDED)  . cyanocobalamin (,VITAMIN B-12,)  1000 MCG/ML injection INJECT 1 ML (1000 MCG TOTAL) INTO THE SKIN EVERY 30 DAYS. (Patient taking differently: Inject 1,000 mcg into the skin every 30 (thirty) days. INJECT 1 ML (1000 MCG TOTAL) INTO THE SKIN EVERY 30 DAYS.)  . ELIQUIS 5 MG TABS tablet TAKE 1 TABLET BY MOUTH TWICE A DAY (Patient taking differently: Take 5 mg by mouth 2 (two) times daily. )  . ENTRESTO 24-26 MG TAKE 1 TABLET BY MOUTH 2 TIMES DAILY  . ferrous sulfate 325 (65 FE) MG tablet Take 1 tablet (325 mg total) by mouth 2 (two) times daily with a meal.  . fish oil-omega-3 fatty acids 1000 MG capsule Take 2 g by mouth daily.  . fluticasone (CUTIVATE) 0.05 % cream Apply to affected area bid prn  . furosemide (LASIX) 40 MG tablet Take 1 tablet (40 mg total) by mouth daily.  Marland Kitchen levothyroxine (SYNTHROID) 100 MCG tablet TAKE 1 TABLET BY MOUTH EVERY DAY BEFORE BREAKFAST  . OXYGEN Inhale into the lungs. 1Lt at bedtime  . potassium chloride 20 MEQ TBCR Take 20 mEq by mouth daily.  . rosuvastatin (CRESTOR) 20 MG tablet Take 1 tablet (20 mg total) by mouth daily.  . traMADol (ULTRAM) 50 MG tablet TAKE 1 TABLET BY MOUTH TWICE A DAY AS NEEDED FOR PAIN (Patient taking differently: Take 50 mg by mouth every 12 (twelve) hours as needed. )  . Vitamin D, Ergocalciferol, (DRISDOL) 1.25 MG (50000 UT) CAPS capsule TAKE 1 CAPSULE (50,000 UNITS TOTAL) BY MOUTH 2 (TWO) TIMES A WEEK.    Allergies  Allergen Reactions  . Aspirin Itching  . Iodinated Diagnostic Agents Rash and Other (See Comments)    CT Contrast    SOCIAL HISTORY/FAMILY HISTORY   Social History   Tobacco Use  . Smoking status:  Never Smoker  . Smokeless tobacco: Never Used  Vaping Use  . Vaping Use: Never used  Substance Use Topics  . Alcohol use: No    Alcohol/week: 0.0 standard drinks  . Drug use: No   Social History   Social History Narrative   Widowed, has 8 children.   Orig from [place], now lives in [place].   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   No tob/alc/drugs.   Tries to walk, but her back starts to hurt.  Has to use a walker.   family history includes Diabetes in her daughter; Lymphoma in her sister; Melanoma in her mother; Stomach cancer in her father.   OBJCTIVE -PE, EKG, labs   Wt Readings from Last 3 Encounters:  09/01/19 202 lb 9.6 oz (91.9 kg)  08/20/19 200 lb (90.7 kg)  07/29/19 198 lb 9.6 oz (90.1 kg)    Physical Exam: BP 128/70   Pulse 69   Ht '5\' 6"'  (1.676 m)   Wt 202 lb 9.6 oz (91.9 kg)   SpO2 95%   BMI 32.70 kg/m  Physical Exam Constitutional:      General: She is not in acute distress.    Appearance: Normal appearance. She is obese. She is not ill-appearing.  HENT:     Head: Normocephalic and atraumatic.  Neck:     Vascular: Hepatojugular reflux (Trivial) present. No carotid bruit or JVD.  Cardiovascular:     Rate and Rhythm: Normal rate and regular rhythm.  No extrasystoles are present.    Chest Wall: PMI is not displaced (Unable to palpate).     Pulses: Decreased pulses (Difficult to palpate because of  edema).     Heart sounds: Murmur heard. High-pitched harsh crescendo-decrescendo early systolic murmur is present with a grade of 1/6 at the upper right sternal border.  No friction rub. Gallop present. S4 sounds present.   Pulmonary:     Effort: Pulmonary effort is normal. No respiratory distress.     Comments: Breath sounds, but no obvious rales or rhonchi. Abdominal:     General: Abdomen is flat. Bowel sounds are normal. There is no distension.     Palpations: Abdomen is soft.     Comments: Obese; no HSM    Musculoskeletal:        General: Swelling (Wearing support stockings.) present. Normal range of motion.     Cervical back: Normal range of motion.     Right lower leg: Edema (2+) present.     Left lower leg: Edema (1+) present.  Neurological:     General: No focal deficit present.     Mental Status: She is alert and oriented to person, place, and time.  Psychiatric:        Mood and Affect: Mood normal.        Behavior: Behavior normal.        Thought Content: Thought content normal.        Judgment: Judgment normal.     Comments: Less anxious than usual.      Adult ECG Report  Rate: 69 ;  Rhythm: normal sinus rhythm and LBBB.  1  AVB.  Otherwise normal axis, intervals and normal durations.;   Narrative Interpretation: In sinus rhythm.  Recent Labs:    Lab Results  Component Value Date   CHOL 176 05/29/2019   HDL 57 05/29/2019   LDLCALC 86 05/29/2019   LDLDIRECT 106.0 05/17/2018   TRIG 196 (H) 05/29/2019   CHOLHDL 3.1 05/29/2019   Lab Results  Component Value Date   CREATININE 1.16 (H) 05/29/2019   BUN 18 05/29/2019   NA 141 05/29/2019   K 4.5 05/29/2019   CL 102 05/29/2019   CO2 25 05/29/2019   Lab Results  Component Value Date   TSH 3.41 02/03/2019    ASSESSMENT/PLAN    Problem List Items Addressed This Visit    Paroxysmal atrial fibrillation ([place]); CHA2DS2Vasc 6 - Eliquis; Amio 100 mg daily (Chronic)    As far as I can tell, she is maintaining sinus rhythm on amiodarone.  Her dose was reduced to 100 mg daily. Beta-blocker stopped because of bradycardia. She is on Eliquis for anticoagulation.  Plan is continued rhythm control since she was very symptomatic with recurrence of A. Fib.  She needs routine surveillance with LFTs, TFTs, annual eye exam and PFTs.  Dr. Lenna Gilford has been following PFTs.      Relevant Orders   EKG 12-Lead (Completed)   ECHOCARDIOGRAM COMPLETE   Coronary artery disease involving native coronary artery of native heart without angina  pectoris (Chronic)    Mild ostial LAD and diagonal disease moderate small ramus intermedius disease.  Not likely causing angina. Not on beta-blocker because she is on amiodarone.  We will therefore use amlodipine for additional blood pressure control. She is on rosuvastatin.  Not on aspirin because of Eliquis.  Monitor for symptoms.  I do not think this is the cause of her reduced EF.      Chronic combined systolic and diastolic CHF (congestive heart failure) (HCC) - Primary (Chronic)    Rate and usual hospitalization for heart failure when she had been doing very well.  Question if maybe she had gone back in A. fib.  Her EF is notably down.  Thankfully, no significant CAD was noted only small branch disease.  She was converted to [facility], and blood pressures pretty stable.  She is now on standing dose of Lasix 40 mg daily.  We reiterated the concept of sliding scale Lasix which she can take intermittent dosing of 80 mg or 40 twice daily for weight gain more than 3 pounds. ->  Thankfully, she has not had to do so recently.  She is on amlodipine 2.5 mg and not on a beta-blocker because of bradycardia in the setting of having amiodarone.  Now that she has been on Entresto for over 3 months, I would like to reassess her EF.  Plan: Reassess 2D echo      Relevant Orders   EKG 12-Lead (Completed)   ECHOCARDIOGRAM COMPLETE   Hypertension (Chronic)    Blood pressure seems to be pretty well controlled now on Entresto plus amlodipine.  She had previously been on Diovan which was converted to Eliquis because of reduced EF.  Not on beta-blocker because of bradycardia with amiodarone.  I would be leery of pushing any further because she has had some orthostatic dizziness.      Pulmonary hypertension due to left heart disease (HCC) (Chronic)    Pretty significant elevated LVEDP and wedge pressure leading to her elevated pulmonary arterial pressures which would suggest pulmonary venous  hypertension.  This should have improved with diuresis.  She was on IV diuretic and is now started on Entresto for afterload reduction.  Fine-line between aggressive afterload reduction and orthostatic hypotension.    Continue with current level of diuretic and as needed additional dosing for weight gain as discussed by sliding scale.        Hyperlipidemia (Chronic)    Not where we like her lipids to be, but definitely improvement since last year.  She is on rosuvastatin 20 mg-having switched from pravastatin.   We have not seen follow-up labs since this change.   We talked about it, given her advanced age, I would be reluctant to push this further.  She only has moderate CAD.  Although the target would be LDL less than 70, she is comfortable with only maintaining current dose and not titrating further for fear of adverse effects.      Acute hypoxemic respiratory failure (HCC)    In the setting of acute CHF exacerbation.  Now no longer hypoxic or dyspneic at rest.      Dilated cardiomyopathy [facility])    Not sure what the etiology of this is from.  It could be because of A. fib, however she has maintained rhythm on amiodarone and we have reduced her dose.  There could be some consideration about this having taken place shortly after her COVID-19 vaccine.  She is now on Entresto and I am leery of titrating up the doses because of her history of orthostatic dizziness.  Not using beta-blocker because maintenance use of amiodarone.  -> Recheck 2D echocardiogram       Relevant Orders   EKG 12-Lead (Completed)   ECHOCARDIOGRAM COMPLETE       COVID-19 Education: The signs and symptoms of COVID-19 were discussed with the patient and how to seek care for testing (follow up with PCP or arrange E-visit).   The importance of social distancing and COVID-19 vaccination was discussed today.  I spent a total of 24 minutes with the patient. >  50%  of the time was spent in direct patient  consultation.  Additional time spent with chart review  / charting (studies, outside notes, etc): 20 -> Multiple clinic visits, hospitalization with echo and cath to review. Total Time: 94mn   Current medicines are reviewed at length with the patient today.  (+/- concerns) n/a  Notice: This dictation was prepared with Dragon dictation along with smaller phrase technology. Any transcriptional errors that result from this process are unintentional and may not be corrected upon review.  Patient Instructions / Medication Changes & Studies & Tests Ordered   Patient Instructions  Medication Instructions:  no changes *If you need a refill on your cardiac medications before your next appointment, please call your pharmacy*   Lab Work: Not  needed   Testing/Procedures: Will be schedule at [place] that you have an echocardiogram. Echocardiography is a painless test that uses sound waves to create images of your heart. It provides your doctor with information about the size and shape of your heart and how well your heart's chambers and valves are working. This procedure takes approximately one hour. There are no restrictions for this procedure.    Follow-Up: At [facility] you and your health needs are our priority.  As part of our continuing mission to provide you with exceptional heart care, we have created designated Provider Care Teams.  These Care Teams include your primary Cardiologist (physician) and Advanced Practice Providers (APPs -  Physician Assistants and Nurse Practitioners) who all work together to provide you with the care you need, when you need it.     Your next appointment:   4 to 5  month(s)  The format for your next appointment:   In Person  Provider:   DGlenetta Hew MD   Other Instructions    Studies Ordered:   Orders Placed This Encounter  Procedures  . EKG 12-Lead  . ECHOCARDIOGRAM COMPLETE       DGlenetta Hew M.D., M.S. Interventional Cardiologist   Pager # [phone] # [phone] [address] [place] [address]  Thank you for choosing [facility]

## 2019-09-01 NOTE — Patient Instructions (Addendum)
Medication Instructions:  no changes *If you need a refill on your cardiac medications before your next appointment, please call your pharmacy*   Lab Work: Not  needed   Testing/Procedures: Will be schedule at [place] has requested that you have an echocardiogram. Echocardiography is a painless test that uses sound waves to create images of your heart. It provides your doctor with information about the size and shape of your heart and how well your heart's chambers and valves are working. This procedure takes approximately one hour. There are no restrictions for this procedure.    Follow-Up: At [facility], you and your health needs are our priority.  As part of our continuing mission to provide you with exceptional heart care, we have created designated Provider Care Teams.  These Care Teams include your primary Cardiologist (physician) and Advanced Practice Providers (APPs -  Physician Assistants and Nurse Practitioners) who all work together to provide you with the care you need, when you need it.     Your next appointment:   4 to 5  month(s)  The format for your next appointment:   In Person  Provider:   Glenetta Hew, MD   Other Instructions

## 2019-09-04 ENCOUNTER — Encounter: Payer: Self-pay | Admitting: Cardiology

## 2019-09-04 NOTE — Assessment & Plan Note (Signed)
Not sure what the etiology of this is from.  It could be because of A. fib, however she has maintained rhythm on amiodarone and we have reduced her dose.  There could be some consideration about this having taken place shortly after her COVID-19 vaccine.  She is now on Entresto and I am leery of titrating up the doses because of her history of orthostatic dizziness.  Not using beta-blocker because maintenance use of amiodarone.  -> Recheck 2D echocardiogram

## 2019-09-04 NOTE — Assessment & Plan Note (Signed)
Pretty significant elevated LVEDP and wedge pressure leading to her elevated pulmonary arterial pressures which would suggest pulmonary venous hypertension.  This should have improved with diuresis.  She was on IV diuretic and is now started on Entresto for afterload reduction.  Fine-line between aggressive afterload reduction and orthostatic hypotension.    Continue with current level of diuretic and as needed additional dosing for weight gain as discussed by sliding scale.

## 2019-09-04 NOTE — Assessment & Plan Note (Signed)
Mild ostial LAD and diagonal disease moderate small ramus intermedius disease.  Not likely causing angina. Not on beta-blocker because she is on amiodarone.  We will therefore use amlodipine for additional blood pressure control. She is on rosuvastatin.  Not on aspirin because of Eliquis.  Monitor for symptoms.  I do not think this is the cause of her reduced EF.

## 2019-09-04 NOTE — Assessment & Plan Note (Signed)
Rate and usual hospitalization for heart failure when she had been doing very well.  Question if maybe she had gone back in A. fib.  Her EF is notably down.  Thankfully, no significant CAD was noted only small branch disease.  She was converted to [facility], and blood pressures pretty stable.  She is now on standing dose of Lasix 40 mg daily.  We reiterated the concept of sliding scale Lasix which she can take intermittent dosing of 80 mg or 40 twice daily for weight gain more than 3 pounds. ->  Thankfully, she has not had to do so recently.  She is on amlodipine 2.5 mg and not on a beta-blocker because of bradycardia in the setting of having amiodarone.  Now that she has been on Entresto for over 3 months, I would like to reassess her EF.  Plan: Reassess 2D echo

## 2019-09-04 NOTE — Assessment & Plan Note (Addendum)
Not where we like her lipids to be, but definitely improvement since last year.  She is on rosuvastatin 20 mg-having switched from pravastatin.   We have not seen follow-up labs since this change.   We talked about it, given her advanced age, I would be reluctant to push this further.  She only has moderate CAD.  Although the target would be LDL less than 70, she is comfortable with only maintaining current dose and not titrating further for fear of adverse effects.

## 2019-09-04 NOTE — Assessment & Plan Note (Signed)
In the setting of acute CHF exacerbation.  Now no longer hypoxic or dyspneic at rest.

## 2019-09-04 NOTE — Assessment & Plan Note (Signed)
Blood pressure seems to be pretty well controlled now on Entresto plus amlodipine.  She had previously been on Diovan which was converted to Eliquis because of reduced EF.  Not on beta-blocker because of bradycardia with amiodarone.  I would be leery of pushing any further because she has had some orthostatic dizziness.

## 2019-09-04 NOTE — Assessment & Plan Note (Signed)
As far as I can tell, she is maintaining sinus rhythm on amiodarone.  Her dose was reduced to 100 mg daily. Beta-blocker stopped because of bradycardia. She is on Eliquis for anticoagulation.  Plan is continued rhythm control since she was very symptomatic with recurrence of A. Fib.  She needs routine surveillance with LFTs, TFTs, annual eye exam and PFTs.  Dr. Lenna Gilford has been following PFTs.

## 2019-09-16 ENCOUNTER — Encounter (HOSPITAL_COMMUNITY): Payer: Self-pay | Admitting: Nurse Practitioner

## 2019-09-16 ENCOUNTER — Other Ambulatory Visit: Payer: Self-pay

## 2019-09-16 ENCOUNTER — Ambulatory Visit (HOSPITAL_COMMUNITY)
Admission: RE | Admit: 2019-09-16 | Discharge: 2019-09-16 | Disposition: A | Discharge status: Home / Self Care | Payer: PPO | Source: Ambulatory Visit | Attending: Nurse Practitioner | Admitting: Nurse Practitioner

## 2019-09-16 VITALS — BP 162/66 | HR 57 | Ht 66.0 in | Wt 202.2 lb

## 2019-09-16 DIAGNOSIS — D6869 Other thrombophilia: Secondary | ICD-10-CM

## 2019-09-16 DIAGNOSIS — E039 Hypothyroidism, unspecified: Secondary | ICD-10-CM | POA: Diagnosis not present

## 2019-09-16 DIAGNOSIS — C8510 Unspecified B-cell lymphoma, unspecified site: Secondary | ICD-10-CM | POA: Insufficient documentation

## 2019-09-16 DIAGNOSIS — I447 Left bundle-branch block, unspecified: Secondary | ICD-10-CM | POA: Diagnosis not present

## 2019-09-16 DIAGNOSIS — Z809 Family history of malignant neoplasm, unspecified: Secondary | ICD-10-CM | POA: Diagnosis not present

## 2019-09-16 DIAGNOSIS — Z79899 Other long term (current) drug therapy: Secondary | ICD-10-CM | POA: Insufficient documentation

## 2019-09-16 DIAGNOSIS — E785 Hyperlipidemia, unspecified: Secondary | ICD-10-CM | POA: Diagnosis not present

## 2019-09-16 DIAGNOSIS — I2722 Pulmonary hypertension due to left heart disease: Secondary | ICD-10-CM | POA: Insufficient documentation

## 2019-09-16 DIAGNOSIS — I5042 Chronic combined systolic (congestive) and diastolic (congestive) heart failure: Secondary | ICD-10-CM | POA: Insufficient documentation

## 2019-09-16 DIAGNOSIS — C7800 Secondary malignant neoplasm of unspecified lung: Secondary | ICD-10-CM | POA: Diagnosis not present

## 2019-09-16 DIAGNOSIS — M170 Bilateral primary osteoarthritis of knee: Secondary | ICD-10-CM | POA: Insufficient documentation

## 2019-09-16 DIAGNOSIS — J449 Chronic obstructive pulmonary disease, unspecified: Secondary | ICD-10-CM | POA: Insufficient documentation

## 2019-09-16 DIAGNOSIS — I480 Paroxysmal atrial fibrillation: Secondary | ICD-10-CM

## 2019-09-16 DIAGNOSIS — F419 Anxiety disorder, unspecified: Secondary | ICD-10-CM | POA: Diagnosis not present

## 2019-09-16 DIAGNOSIS — I130 Hypertensive heart and chronic kidney disease with heart failure and stage 1 through stage 4 chronic kidney disease, or unspecified chronic kidney disease: Secondary | ICD-10-CM | POA: Diagnosis not present

## 2019-09-16 DIAGNOSIS — N183 Chronic kidney disease, stage 3 unspecified: Secondary | ICD-10-CM | POA: Diagnosis not present

## 2019-09-16 DIAGNOSIS — Z7901 Long term (current) use of anticoagulants: Secondary | ICD-10-CM | POA: Insufficient documentation

## 2019-09-16 DIAGNOSIS — I440 Atrioventricular block, first degree: Secondary | ICD-10-CM | POA: Insufficient documentation

## 2019-09-16 DIAGNOSIS — D631 Anemia in chronic kidney disease: Secondary | ICD-10-CM | POA: Diagnosis not present

## 2019-09-16 DIAGNOSIS — Z886 Allergy status to analgesic agent status: Secondary | ICD-10-CM | POA: Diagnosis not present

## 2019-09-16 DIAGNOSIS — I4819 Other persistent atrial fibrillation: Secondary | ICD-10-CM | POA: Diagnosis not present

## 2019-09-16 NOTE — Progress Notes (Signed)
Patient ID: Amanda Sellers, female   DOB: [birth year], 86 y.o.   MRN: [id number]           Primary Care Physician: Jeoffrey Massed, MD Referring Physician: Dr. Richardo Hanks Amanda Sellers is a 86 y.o. female with a h/o  diastolic heart failure, afib, newly diagnosed with recent dx of lymphoma. She was admitted to [facility] 9/22 on IV dilt and given diuretics for acute on chronic systolic HF. It was arranged for to undergo TEE DCCV.  She had exertional dyspnea with PND & orthopnea.She felt very tired & had little energy. She  noted a few palpitations, but does not recall rapid irregular heart rates/rhythms.   She had TEE without thrombus and underwent DCCV that was successful with one shock of 120J. She did well post procedure and was seen by Dr. Judie Petit. Croitoru and found stable for discharge. She was negative 2700cc since admit and weight was down 4 lbs with IV diuretics. D/C wt is 168.  Plan on d/c was for  uninterrupted anticoagulation for next 30 days, preferably lifelong barring bleeding complications. AFib clinic follow up in 1-2 weeks. Outpatient [facility] non-urgent. ASA stopped.   She was seen in the afib clinic, 10/10 and found to be in afib with rvr. She went into afib short time after cardioversion. EKG showed afib at 130-140 bpm. She was  not on any drugs for rate control.  Is taking DOAC without fail. Continues with chemotherapy for lymphoma.   She was started on BB and  returned to clinic 10/12 with better v rates. 80's with sitting and 90-100's with activity. She does not have any PND/orthopnea although she has slept better in her recliner for months now.No lower extremity edema. Her weight is stable at home.  She has  finished chemotherapy for non- hodgkin's lymphoma and had f/u Pet scan 10/26,  and CA appears to be in remission, so per pt, no more chemo for right now. She has been on DOAC now x one month so will start attempts to restore SR. Discussed options  amiodarone or tikosyn and she would prefer amiodarone as she has two neighbors on amiodarone and they are both in rhythm and tolerating drug well. She is not interested in a hospital stay for drug loading. She reports rash with recent contrast dye. Amiodarone was started 200 mg bid, to then go down to 200 mg a day and then would discuss cardioversion.  She asked to be seen 11/2,  in the afib clinic for increased shortness of breath yesterday and last night. She has slept in the recliner for months but did not feel as well last night and slept poorly, feeling more short of breath and fullness in her chest.She has noticed increased pedal edema. She did not take her amiodarone last night or this am. Ekg shows rate controlled afib at 82 bpm, qtc at 464 ms.PO on RA is 98%. Weight is up 2 lbs from last visit, 6 lbs since 10/17. Lasix dose was decreased to 40 mg daily from 40 mg am and 20 mg pm when seen by Dr. Herbie Baltimore 10/22.. Pt has noticed weight climbing for several days. She is fairly comfortable when quiet and sitting.   She was asked to double lasix to 80 mg  x 3 days, and to reduce amiodarone 200 mg a day. She had lost 3 pounds on return to clinic several days later. Clinically, she is much improved with more energy and less shortness of  breath. Able to sleep well. Had enough energy to walk into clinic today without wheelchair and felt well enough to clean her house yesterday. Ideally,I would like to load amiodarone a while longer but will go ahead and  pursue DCCV sooner than later, as it is contributing to her heart failure. Compliant with blood thinner without missed doses On recent labs, TSH was elevated but daughter explained that she just started taking replacement correctly on an empty stomach, and sees PCP 11/16 and she expects him to adjust the dose.  11/14-  Returns to afib clinic with a smile on her face and reporting all the activities she has been doing over the last week, having returned to  sinus rhythm after cardioversion. Much more energy and breathing is much improved.  Fluid status is stable.  12/13- Returns to afib clinic for f/u and reports doing well No afib, no further shortness of breath or edema. She is back to her usual activities. Got a good report form the Ca unit recently. Tolerating amiodarone 200 mg a day.  Return to afib clinic 4/18, reports that she is staying in [place]. Saw pulmonology yesterday for some c/o of shortness of breath but breathing much improved since staying in SR. Xray no active process, thyroid, liver studies drawn yesterday, and normal. MD said he would call hr after results of tests were reviewed. Fluid status normal.  F/u in afib clinic 01/31/16, pt continues to do well without any afib. Her fluid status is normal. Last TSH, 3 months ago, showed mild increase and will check TSH and liver panel today. Her breathing is good other than the last week in the bitter cold weather  she does not breath as good outside.No bleeding issues with eliquis. Fluid status is stable on lasix.  F/u in afib 01/26/17  for shortness of breath. Pt has been having shortness of breath for some while but contributed it to her knees and difficultly in ambulating. She was seen by pulmonology in July with improved PFT's and was OK to continue amiodarone. She was seen in November by Dr. Ellyn Hack and shortness of breath was thought to be stable/chronic. She feels that her shortness of breath has worsened since Christmas.  If she walks to the bathroom and back she is entirely out of breath. Sleeps on 2 pillows and has not felt the need to increase to elevate her head more at night. Ekg today shows junctional rhythm at 46 bpm. She is breathless speaking and started walking to clinic room but had to stop and get w/c. PO 99%.  F/u in afib clinic 1/7, after reducing amiodarone to 1/2 tab a day as well BB to 1/2 tab a day for brady as well as increase in shortness of breath. She is feeling much better  today to the point she was able to walk to clinic and not use w/c. She is not breathless talking.  F/u in afib clinic, 1/23, She feels great! Saw her pulmonologist recently and she said he was very pleased with her lung status. She is walking with a walker and observed coming into the clinic, no exertional dyspnea noted.  Her  HR is Sinus in the 40's today, but she is not symptomatic. She checks her HR with a pulse ox several times a day and it usually runs around 53-60.   F/u in afib clinic, 08/15/17. She is feeling well. Just saw Dr. Lenna Gilford and was given a good report. Ekg, SR with HR's in the 50's. 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 She has some chromic LE, which was worse after recent knee injections.. She did increase lasix for a couple of days and now back to baseline now.  F/u in afib clinic, 1/8, she is doing very well. She is in SR and has not noted any afib. 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 No LLE. She walks with a walker, no falls. No shortness of breath. On amiodarone 100 mg qd. Notices a little numbness in her distal fingertips intermittently.No issues with anticoagulation.  F/u in 07/31/18. Continues on amiodarone for afib management. She is in [place] today and has not noted any irregular heart rhythm. Her main complaint is that she has noted more LLE and has been back to sleeping in her recliner. She continues on lasix 40 mg bid. Avoids salt and salty foods and sits with her feet up during the day. No change in her health other than increase in  edema. She does drink 8 + glasses of water a day and may need to back off that some.weight is up 7 lbs when compared to last in person office visit in March.  F/u 03/11/19. She states that she feels great. She is in SR, no appreciated afib. Continues on amiodarone.  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 No pedal edema or shortness of breath. No issues with eliquis with a CHA2DS2VASc score of 5.  F/u in afib clinic,  09/16/19. She remains on amiodarone 100 mg daily and is staying in SR. She was hospitalized for fluid retention/shortness of breath in April 2021. Had a LHC, had some CAD but thought to be best treated medically. She was placed on entresto and amlodipine. She has done well since then. Denies any dyspnea  today. She continues to sit with the elderly, but now has a break,  as the woman she was staying with fell and broke her hip and is still in the hospital. She is currently in the donut hole with entresto and eliquis, being her most expensive drugs.   Today, she denies symptoms of palpitations, chest pain,  orthopnea, PND, mild baseline chronic lower extremity edema, no dizziness, presyncope, syncope, or neurologic sequela.  The patient is tolerating medications without difficulties and is otherwise without complaint today.   Past Medical History:  Diagnosis Date  . Anemia of chronic disease   . Anticoagulation adequate, Eliquis 10/16/2014  . Anxiety    clonaz helps this AND her breathing  . Chronic combined systolic and diastolic congestive heart failure ([place]) 75/17/0017   Complicated by atrial fibrillation; Echo 07/23/14: mild LVH, EF 50-55%, grade 2 diast dysfxn.;  Echo May 15, 2019: EF 35 to 40%, GRII DD.  (In setting of CHF exacerbation, class III)-> baseline class II symptoms  . Chronic renal insufficiency, stage III (moderate)    GFR @ 50  . COPD (chronic obstructive pulmonary disease) ([place])    recently noted on Xray, does not have any problems  . Follicular lymphoma ([place])    Non Hodgkins B cell lymphoma; s/p chemo summer 2016--in remission still as of 09/2018: continue obs q 29mo . HLD (hyperlipidemia)     06/2014  . HTN (hypertension)   . Hypothyroidism   . IFG (impaired fasting glucose)   . Lip cancer    Dr. BJanace Hoardexcised this: invasive SCC--no sign of cancer at ENT f/u 10/2015  . Microcytic anemia    transfused 3  U total in hosp 07/2014  . Obesity (BMI 30-39.9)   . Olecranon  bursitis of right elbow 02/2015  . Pancytopenia due to antineoplastic chemotherapy ([place])   . Persistent atrial fibrillation ([facility])    With RVR; elec cardioversion 11/30/14.  On Amio since 10/2014; Dr. Lenna Gilford following her from pulm standpoint regarding this med.  Marland Kitchen Positive occult stool blood test 08/08/14   Endoscopies ok 08/2014  . Primary osteoarthritis of both knees 04/2016   Dr. Gladstone Lighter: bone on bone--to get euflexxa to both knees.  . Pulmonary hypertension due to left heart disease ([place])    RLCP 05/19/2019: EF 35-40%. D1 50%, RI 70%, dLM-LAD 30%.  mPAP 43 mmHg - (62/19 mmHg), PCWP  28 mmHg (V wave 41 mmHg) LVEDP 34 mmHg  . Pulmonary metastases ([place]) 07/24/2014  . Vitamin B 12 deficiency   . Vitamin D deficiency    dose increased to 50 K U tab TWICE weekly 04/2017   Past Surgical History:  Procedure Laterality Date  . ABDOMINAL HYSTERECTOMY  1978  . BONE MARROW BIOPSY  07/24/14  . BREAST BIOPSY    . CARDIOVERSION N/A 10/16/2014   Procedure: CARDIOVERSION;  Surgeon: Lelon Perla, MD;  Location: [facility] ENDOSCOPY;  Service: Cardiovascular;  Laterality: N/A;  . CARDIOVERSION N/A 11/30/2014   Procedure: CARDIOVERSION;  Surgeon: Larey Dresser, MD;  Location: [place];  Service: Cardiovascular;  Laterality: N/A;  . CATARACT EXTRACTION  2020   OU: Dr. Katy Fitch  . COLONOSCOPY  09/23/14   small hemorrhoids, otherwise normal (performed for IDA and heme+ stool)  . COLONOSCOPY    . MASS EXCISION Left 07/15/2015   Left lower lip mass--invasive SCC w/negative margins.  Procedure: EXCISION MASS;  Surgeon: Melissa Montane, MD;  Location: [place];  Service: ENT;  Laterality: Left;  Wedge excision left lower lip mass  . NM MYOVIEW LTD  10/29/2014   medium size mild surgery defect in the mid anterior and apical anterior location suggestive of breast attenuation. No reversibility. LOW RISK  . PFTs  02/2015   Restriction with diffusion defect: cardiologist referred her to pulm to help interpret PFTs and decide whether  she has amiodarone toxicity  . RIGHT/LEFT HEART CATH AND CORONARY ANGIOGRAPHY N/A 05/19/2019   Procedure: RIGHT/LEFT HEART CATH AND CORONARY ANGIOGRAPHY;  Surgeon: Troy Sine, MD;  Location: [place] CV LAB;; EF 35-40%. D1 50%, RI 70%, dLM-LAD 30%.  mPAP 43 mmHg - (62/19 mmHg), PCWP  28 mmHg (V wave 41 mmHg) LVEDP 34 mmHg  . TEE WITHOUT CARDIOVERSION N/A 10/16/2014   Procedure: TRANSESOPHAGEAL ECHOCARDIOGRAM (TEE);  Surgeon: Lelon Perla, MD;  Location: [facility] ENDOSCOPY;  Service: Cardiovascular;  Laterality: N/A;  . TRANSTHORACIC ECHOCARDIOGRAM  07/23/14   mild LVH, EF 50-55%, grade 2 diast dysfxn  . TRANSTHORACIC ECHOCARDIOGRAM  05/15/2019   Acute CHF exacerbation:  EF 35-40%.  Moderately reduced function.  (Previous EF was 55 to 60%) global HK.  GRII DD.  Mild LA dilation.  Normal RV size.  Marland Kitchen UPPER GI ENDOSCOPY  09/23/2014   small hiatus hernia, nodules in stomach biopsied (chronic active erosive atrophic gastritis with intestinal metaplasia--no dysplasia or malignancy) otherwise normal    Current Outpatient Medications  Medication Sig Dispense Refill  . amiodarone (PACERONE) 200 MG tablet Take 0.5 tablets (100 mg total) by mouth daily. 90 tablet 2  . amLODipine (NORVASC) 2.5 MG tablet Take 1 tablet (2.5 mg total) by mouth daily. 90 tablet 3  . clonazePAM (KLONOPIN) 0.5 MG tablet TAKE 1/2  TO 1 TABLET BY MOUTH TWICE A DAY AS NEEDED (Patient taking differently: Take 0.25-0.5 mg by mouth 2 (two) times daily as needed. TAKE 1/2 TO 1 TABLET BY MOUTH TWICE A DAY AS NEEDED) 90 tablet 1  . cyanocobalamin (,VITAMIN B-12,) 1000 MCG/ML injection INJECT 1 ML (1000 MCG TOTAL) INTO THE SKIN EVERY 30 DAYS. (Patient taking differently: Inject 1,000 mcg into the skin every 30 (thirty) days. INJECT 1 ML (1000 MCG TOTAL) INTO THE SKIN EVERY 30 DAYS.) 10 mL 0  . ELIQUIS 5 MG TABS tablet TAKE 1 TABLET BY MOUTH TWICE A DAY (Patient taking differently: Take 5 mg by mouth 2 (two) times daily. ) 180 tablet 2  .  ENTRESTO 24-26 MG TAKE 1 TABLET BY MOUTH 2 TIMES DAILY 60 tablet 1  . ferrous sulfate 325 (65 FE) MG tablet Take 1 tablet (325 mg total) by mouth 2 (two) times daily with a meal. 30 tablet 1  . fish oil-omega-3 fatty acids 1000 MG capsule Take 2 g by mouth daily.    . fluticasone (CUTIVATE) 0.05 % cream Apply to affected area bid prn 30 g 2  . furosemide (LASIX) 40 MG tablet Take 1 tablet (40 mg total) by mouth daily. 30 tablet 2  . levothyroxine (SYNTHROID) 100 MCG tablet TAKE 1 TABLET BY MOUTH EVERY DAY BEFORE BREAKFAST 90 tablet 3  . OXYGEN Inhale into the lungs. 1Lt at bedtime    . potassium chloride 20 MEQ TBCR Take 20 mEq by mouth daily. 30 tablet 3  . rosuvastatin (CRESTOR) 20 MG tablet Take 1 tablet (20 mg total) by mouth daily. 90 tablet 0  . traMADol (ULTRAM) 50 MG tablet TAKE 1 TABLET BY MOUTH TWICE A DAY AS NEEDED FOR PAIN (Patient taking differently: Take 50 mg by mouth every 12 (twelve) hours as needed. ) 60 tablet 3  . Vitamin D, Ergocalciferol, (DRISDOL) 1.25 MG (50000 UT) CAPS capsule TAKE 1 CAPSULE (50,000 UNITS TOTAL) BY MOUTH 2 (TWO) TIMES A WEEK. 24 capsule 3   No current facility-administered medications for this encounter.    Allergies  Allergen Reactions  . Aspirin Itching  . Iodinated Diagnostic Agents Rash and Other (See Comments)    CT Contrast    Social History   Socioeconomic History  . Marital status: Married    Spouse name: Not on file  . Number of children: 8  . Years of education: Not on file  . Highest education level: Not on file  Occupational History  . Occupation: retired  Tobacco Use  . Smoking status: Never Smoker  . Smokeless tobacco: Never Used  Vaping Use  . Vaping Use: Never used  Substance and Sexual Activity  . Alcohol use: No    Alcohol/week: 0.0 standard drinks  . Drug use: No  . Sexual activity: Not on file  Other Topics Concern  . Not on file  Social History Narrative   Widowed, has 8 children.   Orig from [place], now  lives in [place].   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   No tob/alc/drugs.   Tries to walk, but her back starts to hurt.  Has to use a walker.   Social Determinants of Health   Financial Resource Strain:   . Difficulty of Paying Living Expenses: Not on file  Food Insecurity:   . Worried About Charity fundraiser in the Last Year: Not on file  . Ran Out of Food in the  Last Year: Not on file  Transportation Needs:   . Lack of Transportation (Medical): Not on file  . Lack of Transportation (Non-Medical): Not on file  Physical Activity:   . Days of Exercise per Week: Not on file  . Minutes of Exercise per Session: Not on file  Stress:   . Feeling of Stress : Not on file  Social Connections:   . Frequency of Communication with Friends and Family: Not on file  . Frequency of Social Gatherings with Friends and Family: Not on file  . Attends Religious Services: Not on file  . Active Member of Clubs or Organizations: Not on file  . Attends Archivist Meetings: Not on file  . Marital Status: Not on file  Intimate Partner Violence:   . Fear of Current or Ex-Partner: Not on file  . Emotionally Abused: Not on file  . Physically Abused: Not on file  . Sexually Abused: Not on file    Family History  Problem Relation Age of Onset  . Melanoma Mother   . Stomach cancer Father   . Lymphoma Sister   . Diabetes Daughter     ROS- All systems are reviewed and negative except as per the HPI above  Physical Exam: Vitals:   09/16/19 0924  BP: (!) 162/66  Pulse: (!) 57  Weight: 91.7 kg  Height: _0  (1.676 m)    GEN- The patient is well appearing, alert and oriented x 3 today.   Head- normocephalic, atraumatic Eyes-  Sclera clear, conjunctiva pink Ears- hearing intact Oropharynx- clear Neck- supple, no JVP Lymph- no cervical lymphadenopathy Lungs- Clear to ausculation bilaterally, normal work of breathing Heart-  regular  rate and rhythm, no murmurs, rubs or gallops, PMI not laterally displaced GI- soft, NT, ND, + BS Extremities- no clubbing, cyanosis, 2+  Edema to knee area MS- no significant deformity or atrophy Skin- no rash or lesion Psych- euthymic mood, full affect Neuro- strength and sensation are intact  EKG- Sinus rhythm at 57 bpm. LBBB. with first degree AV block, pr int 224 ms, qrs  128 ms, qtc 488 ms (stable) Epic records reviewed. Labs reviewed   Assessment and Plan: 1 Afib Maintaining SR on 100 mg amiodarone Off BB for some time now  for bradycardia Continue apixaban for chadsvasc score of at least 4  Cmet recently checked and is due physical labs early September, encouraged to  make sure TSH is ordered, as one has not been drawn is several months    2. Diastolic heart failure Weight  stable  Continue  lasix 40 mg qd Continue  entresto   Avoid salt   3. HTN Elevated in office  BP 132/70 at home this am  At home BP is stable   4. Non Hodgkns B cell lymphoma S/p chemotherapy Per oncology  Pt is in donut  hole, gave her the Eliquis financial assistance  forms when she reaches 3% out of pocket requirement   Butch Penny C. Hydie Langan, [facility] [address] [place], [place] [address] [phone]

## 2019-09-19 ENCOUNTER — Other Ambulatory Visit (HOSPITAL_COMMUNITY): Payer: PPO

## 2019-09-22 NOTE — Addendum Note (Signed)
Encounter addended by: Sherran Needs, NP on: 09/22/2019 8:45 AM  Actions taken: Level of Service modified

## 2019-09-27 ENCOUNTER — Other Ambulatory Visit: Payer: Self-pay | Admitting: Adult Health

## 2019-09-27 ENCOUNTER — Other Ambulatory Visit: Payer: Self-pay | Admitting: Family Medicine

## 2019-09-27 DIAGNOSIS — Z23 Encounter for immunization: Secondary | ICD-10-CM

## 2019-09-30 ENCOUNTER — Encounter: Payer: Self-pay | Admitting: Family Medicine

## 2019-09-30 ENCOUNTER — Other Ambulatory Visit: Payer: Self-pay

## 2019-09-30 ENCOUNTER — Ambulatory Visit (INDEPENDENT_AMBULATORY_CARE_PROVIDER_SITE_OTHER): Payer: PPO | Admitting: Family Medicine

## 2019-09-30 VITALS — BP 161/75 | HR 75 | Temp 97.5°F | Resp 16 | Wt 204.6 lb

## 2019-09-30 DIAGNOSIS — E7800 Pure hypercholesterolemia, unspecified: Secondary | ICD-10-CM

## 2019-09-30 DIAGNOSIS — I4891 Unspecified atrial fibrillation: Secondary | ICD-10-CM | POA: Diagnosis not present

## 2019-09-30 DIAGNOSIS — Z23 Encounter for immunization: Secondary | ICD-10-CM

## 2019-09-30 DIAGNOSIS — E538 Deficiency of other specified B group vitamins: Secondary | ICD-10-CM | POA: Diagnosis not present

## 2019-09-30 DIAGNOSIS — Z7901 Long term (current) use of anticoagulants: Secondary | ICD-10-CM

## 2019-09-30 DIAGNOSIS — E039 Hypothyroidism, unspecified: Secondary | ICD-10-CM

## 2019-09-30 DIAGNOSIS — I10 Essential (primary) hypertension: Secondary | ICD-10-CM

## 2019-09-30 DIAGNOSIS — N183 Chronic kidney disease, stage 3 unspecified: Secondary | ICD-10-CM

## 2019-09-30 LAB — CBC WITH DIFFERENTIAL/PLATELET
Basophils Absolute: 0 10*3/uL (ref 0.0–0.1)
Basophils Relative: 0.8 % (ref 0.0–3.0)
Eosinophils Absolute: 0.1 10*3/uL (ref 0.0–0.7)
Eosinophils Relative: 2.3 % (ref 0.0–5.0)
HCT: 40.6 % (ref 36.0–46.0)
Hemoglobin: 13.6 g/dL (ref 12.0–15.0)
Lymphocytes Relative: 31 % (ref 12.0–46.0)
Lymphs Abs: 1.1 10*3/uL (ref 0.7–4.0)
MCHC: 33.4 g/dL (ref 30.0–36.0)
MCV: 93.6 fl (ref 78.0–100.0)
Monocytes Absolute: 0.4 10*3/uL (ref 0.1–1.0)
Monocytes Relative: 10.4 % (ref 3.0–12.0)
Neutro Abs: 1.9 10*3/uL (ref 1.4–7.7)
Neutrophils Relative %: 55.5 % (ref 43.0–77.0)
Platelets: 119 10*3/uL — ABNORMAL LOW (ref 150.0–400.0)
RBC: 4.34 Mil/uL (ref 3.87–5.11)
RDW: 14 % (ref 11.5–15.5)
WBC: 3.5 10*3/uL — ABNORMAL LOW (ref 4.0–10.5)

## 2019-09-30 LAB — LIPID PANEL
Cholesterol: 165 mg/dL (ref 0–200)
HDL: 66.9 mg/dL (ref >39.00)
LDL Cholesterol: 66 mg/dL (ref 0–99)
NonHDL: 97.7
Total CHOL/HDL Ratio: 2
Triglycerides: 157 mg/dL — ABNORMAL HIGH (ref 0.0–149.0)
VLDL: 31.4 mg/dL (ref 0.0–40.0)

## 2019-09-30 LAB — COMPREHENSIVE METABOLIC PANEL
ALT: 16 U/L (ref 0–35)
AST: 18 U/L (ref 0–37)
Albumin: 4.4 g/dL (ref 3.5–5.2)
Alkaline Phosphatase: 40 U/L (ref 39–117)
BUN: 16 mg/dL (ref 6–23)
CO2: 33 mEq/L — ABNORMAL HIGH (ref 19–32)
Calcium: 8.9 mg/dL (ref 8.4–10.5)
Chloride: 102 mEq/L (ref 96–112)
Creatinine, Ser: 0.92 mg/dL (ref 0.40–1.20)
GFR: 57.99 mL/min — ABNORMAL LOW (ref >60.00)
Glucose, Bld: 98 mg/dL (ref 70–99)
Potassium: 4.5 mEq/L (ref 3.5–5.1)
Sodium: 143 mEq/L (ref 135–145)
Total Bilirubin: 0.8 mg/dL (ref 0.2–1.2)
Total Protein: 6.2 g/dL (ref 6.0–8.3)

## 2019-09-30 LAB — VITAMIN B12: Vitamin B-12: 637 pg/mL (ref 211–911)

## 2019-09-30 LAB — TSH: TSH: 3.62 u[IU]/mL (ref 0.35–4.50)

## 2019-09-30 NOTE — Addendum Note (Signed)
Addended by: Octaviano Glow on: 09/30/2019 01:12 PM   Modules accepted: Orders

## 2019-09-30 NOTE — Progress Notes (Signed)
OFFICE VISIT  09/30/2019  CC:  Chief Complaint  Patient presents with  . Follow-up    RCI    HPI:    Patient is a 86 y.o. Caucasian female who presents for f/u hypothyroidism, CRI III, HTN, and anxiety-related component to her SOB. She has chronic hypox RF chronic combined syst/diast HF and persistent a-fib---followed by specialists. She saw HF clinic 07/29/19 with some progression of DOE and PFTs were ordered, possible amiodarone effect suspected---this test has not been done yet. A repeat echo has also been ordered.   Hypoth: Hypoth: Takes T4 on empty stomach w/out any other meds.  CRI III: avoid NSAIDs  HTN: checks bp bid at home consistently 130/80 or better.  B12 def: most recent b12 inj was 09/22/18 Anxiety--rarely takes clonazepam.  Pain--knees-->RARELY takes tramadol. PMP AWARE reviewed today: most recent rx for tramadol was filled 04/19/19, # 70, rx by me. Most recent rx for clonaz was filled 02/15/19, #90, Rx by me. No red flags.  ROS: no fevers, no CP, no SOB, no wheezing, no cough, no dizziness, no HAs, no rashes, no melena/hematochezia.  No polyuria or polydipsia.  No myalgias or arthralgias.  No focal weakness, paresthesias, or tremors.  No acute vision or hearing abnormalities. No n/v/d or abd pain.  No palpitations.   SWELLING IN LL'S MUCH IMPROVED WITH WEARING COMPRESSION HOSE ALL DAY.  Past Medical History:  Diagnosis Date  . Anemia of chronic disease   . Anticoagulation adequate, Eliquis 10/16/2014  . Anxiety    clonaz helps this AND her breathing  . Chronic combined systolic and diastolic congestive heart failure ([place]) 81/44/8185   Complicated by atrial fibrillation; Echo 07/23/14: mild LVH, EF 50-55%, grade 2 diast dysfxn.;  Echo May 15, 2019: EF 35 to 40%, GRII DD.  (In setting of CHF exacerbation, class III)-> baseline class II symptoms  . Chronic renal insufficiency, stage III (moderate)    GFR @ 50  . COPD (chronic obstructive pulmonary disease) ([place])     recently noted on Xray, does not have any problems  . Follicular lymphoma ([place])    Non Hodgkins B cell lymphoma; s/p chemo summer 2016--in remission still as of 09/2018: continue obs q 71mo . HLD (hyperlipidemia)     06/2014  . HTN (hypertension)   . Hypothyroidism   . IFG (impaired fasting glucose)   . Lip cancer    Dr. BJanace Hoardexcised this: invasive SCC--no sign of cancer at ENT f/u 10/2015  . Microcytic anemia    transfused 3 U total in hosp 07/2014  . Obesity (BMI 30-39.9)   . Olecranon bursitis of right elbow 02/2015  . Pancytopenia due to antineoplastic chemotherapy ([place]   . Persistent atrial fibrillation (HCC)    With RVR; elec cardioversion 11/30/14.  On Amio since 10/2014; Dr. NLenna Gilfordfollowing her from pulm standpoint regarding this med.  .Marland KitchenPositive occult stool blood test 08/08/14   Endoscopies ok 08/2014  . Primary osteoarthritis of both knees 04/2016   Dr. GGladstone Lighter bone on bone--to get euflexxa to both knees.  . Pulmonary hypertension due to left heart disease ([facility]    RLCP 05/19/2019: EF 35-40%. D1 50%, RI 70%, dLM-LAD 30%.  mPAP 43 mmHg - (62/19 mmHg), PCWP  28 mmHg (V wave 41 mmHg) LVEDP 34 mmHg  . Pulmonary metastases ([facility] 07/24/2014  . Vitamin B 12 deficiency   . Vitamin D deficiency    dose increased to 50 K U tab TWICE weekly 04/2017    Past Surgical  History:  Procedure Laterality Date  . ABDOMINAL HYSTERECTOMY  1978  . BONE MARROW BIOPSY  07/24/14  . BREAST BIOPSY    . CARDIOVERSION N/A 10/16/2014   Procedure: CARDIOVERSION;  Surgeon: Lelon Perla, MD;  Location: [facility] ENDOSCOPY;  Service: Cardiovascular;  Laterality: N/A;  . CARDIOVERSION N/A 11/30/2014   Procedure: CARDIOVERSION;  Surgeon: Larey Dresser, MD;  Location: [facility];  Service: Cardiovascular;  Laterality: N/A;  . CATARACT EXTRACTION  2020   OU: Dr. Katy Fitch  . COLONOSCOPY  09/23/14   small hemorrhoids, otherwise normal (performed for IDA and heme+ stool)  . COLONOSCOPY    . MASS EXCISION Left  07/15/2015   Left lower lip mass--invasive SCC w/negative margins.  Procedure: EXCISION MASS;  Surgeon: Melissa Montane, MD;  Location: [place];  Service: ENT;  Laterality: Left;  Wedge excision left lower lip mass  . NM MYOVIEW LTD  10/29/2014   medium size mild surgery defect in the mid anterior and apical anterior location suggestive of breast attenuation. No reversibility. LOW RISK  . PFTs  02/2015   Restriction with diffusion defect: cardiologist referred her to pulm to help interpret PFTs and decide whether she has amiodarone toxicity  . RIGHT/LEFT HEART CATH AND CORONARY ANGIOGRAPHY N/A 05/19/2019   Procedure: RIGHT/LEFT HEART CATH AND CORONARY ANGIOGRAPHY;  Surgeon: Troy Sine, MD;  Location: [place] CV LAB;; EF 35-40%. D1 50%, RI 70%, dLM-LAD 30%.  mPAP 43 mmHg - (62/19 mmHg), PCWP  28 mmHg (V wave 41 mmHg) LVEDP 34 mmHg  . TEE WITHOUT CARDIOVERSION N/A 10/16/2014   Procedure: TRANSESOPHAGEAL ECHOCARDIOGRAM (TEE);  Surgeon: Lelon Perla, MD;  Location: [facility] ENDOSCOPY;  Service: Cardiovascular;  Laterality: N/A;  . TRANSTHORACIC ECHOCARDIOGRAM  07/23/14   mild LVH, EF 50-55%, grade 2 diast dysfxn  . TRANSTHORACIC ECHOCARDIOGRAM  05/15/2019   Acute CHF exacerbation:  EF 35-40%.  Moderately reduced function.  (Previous EF was 55 to 60%) global HK.  GRII DD.  Mild LA dilation.  Normal RV size.  Marland Kitchen UPPER GI ENDOSCOPY  09/23/2014   small hiatus hernia, nodules in stomach biopsied (chronic active erosive atrophic gastritis with intestinal metaplasia--no dysplasia or malignancy) otherwise normal    Outpatient Medications Prior to Visit  Medication Sig Dispense Refill  . amiodarone (PACERONE) 200 MG tablet Take 0.5 tablets (100 mg total) by mouth daily. 90 tablet 2  . amLODipine (NORVASC) 2.5 MG tablet Take 1 tablet (2.5 mg total) by mouth daily. 90 tablet 3  . clonazePAM (KLONOPIN) 0.5 MG tablet TAKE 1/2 TO 1 TABLET BY MOUTH TWICE A DAY AS NEEDED (Patient taking differently: Take 0.25-0.5 mg by  mouth 2 (two) times daily as needed. TAKE 1/2 TO 1 TABLET BY MOUTH TWICE A DAY AS NEEDED) 90 tablet 1  . cyanocobalamin (,VITAMIN B-12,) 1000 MCG/ML injection INJECT 1 ML (1000 MCG TOTAL) INTO THE SKIN EVERY 30 DAYS. (Patient taking differently: Inject 1,000 mcg into the skin every 30 (thirty) days. INJECT 1 ML (1000 MCG TOTAL) INTO THE SKIN EVERY 30 DAYS.) 10 mL 0  . ELIQUIS 5 MG TABS tablet TAKE 1 TABLET BY MOUTH TWICE A DAY (Patient taking differently: Take 5 mg by mouth 2 (two) times daily. ) 180 tablet 2  . ENTRESTO 24-26 MG TAKE 1 TABLET BY MOUTH 2 TIMES DAILY 60 tablet 1  . ferrous sulfate 325 (65 FE) MG tablet Take 1 tablet (325 mg total) by mouth 2 (two) times daily with a meal. 30 tablet 1  . fish  oil-omega-3 fatty acids 1000 MG capsule Take 2 g by mouth daily.    . fluticasone (CUTIVATE) 0.05 % cream Apply to affected area bid prn 30 g 2  . furosemide (LASIX) 40 MG tablet Take 1 tablet (40 mg total) by mouth daily. 30 tablet 2  . levothyroxine (SYNTHROID) 100 MCG tablet TAKE 1 TABLET BY MOUTH EVERY DAY BEFORE BREAKFAST 90 tablet 3  . OXYGEN Inhale into the lungs. 1Lt at bedtime    . potassium chloride 20 MEQ TBCR Take 20 mEq by mouth daily. 30 tablet 3  . rosuvastatin (CRESTOR) 20 MG tablet TAKE 1 TABLET BY MOUTH EVERY DAY 90 tablet 3  . traMADol (ULTRAM) 50 MG tablet TAKE 1 TABLET BY MOUTH TWICE A DAY AS NEEDED FOR PAIN (Patient taking differently: Take 50 mg by mouth every 12 (twelve) hours as needed. ) 60 tablet 3  . Vitamin D, Ergocalciferol, (DRISDOL) 1.25 MG (50000 UT) CAPS capsule TAKE 1 CAPSULE (50,000 UNITS TOTAL) BY MOUTH 2 (TWO) TIMES A WEEK. 24 capsule 3   No facility-administered medications prior to visit.    Allergies  Allergen Reactions  . Aspirin Itching  . Iodinated Diagnostic Agents Rash and Other (See Comments)    CT Contrast    ROS As per HPI  PE: Vitals with BMI 09/30/2019 09/16/2019 09/01/2019  Height - _0  _1   Weight 204 lbs 10 oz 202 lbs 3 oz 202 lbs  10 oz  BMI 33.04 67.20 94.70  Systolic 962 836 629  Diastolic 75 66 70  Pulse 75 57 69   Gen: Alert, well appearing.  Patient is oriented to person, place, time, and situation. AFFECT: pleasant, lucid thought and speech. CV: RRR, no m/r/g.   LUNGS: CTA bilat, nonlabored resps, good aeration in all lung fields. EXT: no clubbing or cyanosis.  Trace to 1+ bilat LL pitting edema palpable through compression stockings.   LABS:  Lab Results  Component Value Date   TSH 3.41 02/03/2019   Lab Results  Component Value Date   WBC 5.0 05/21/2019   HGB 13.1 05/21/2019   HCT 41.2 05/21/2019   MCV 92.6 05/21/2019   PLT 143 (L) 05/21/2019   Lab Results  Component Value Date   CREATININE 1.16 (H) 05/29/2019   BUN 18 05/29/2019   NA 141 05/29/2019   K 4.5 05/29/2019   CL 102 05/29/2019   CO2 25 05/29/2019   Lab Results  Component Value Date   ALT 33 (H) 05/29/2019   AST 28 05/29/2019   ALKPHOS 51 05/29/2019   BILITOT 0.5 05/29/2019   Lab Results  Component Value Date   CHOL 176 05/29/2019   Lab Results  Component Value Date   HDL 57 05/29/2019   Lab Results  Component Value Date   LDLCALC 86 05/29/2019   Lab Results  Component Value Date   TRIG 196 (H) 05/29/2019   Lab Results  Component Value Date   CHOLHDL 3.1 05/29/2019   Lab Results  Component Value Date   HGBA1C 5.6 11/04/2018   Lab Results  Component Value Date   VITAMINB12 240 02/03/2019    IMPRESSION AND PLAN:  1) HTN: The current medical regimen is effective;  continue present plan and medications. Lytes/cr today  2) HLD: tolerating statin. FLP and hepatic panel today.  3) CRI III: avoids NSAIDs. Hydrates fairly well in the setting of her known HFrEF. Lytes/cr today.  4) Anxiety: this leads to increase in her SOB at times.  She is  hardly using this at all anymore. PMP aware info reviewed/reassuring. No new rx for this today.  5) Preventative health: Flu vaccine today.  6) A-fib, on amio  and OAC, sinus today.   PFTs coming up, TSH monitoring today.  CBC today.  7) CHF: combined diast/syst: on entresto and lasix. No sign of volume overload.  Lytes/cr today. Echo ordered by cardiology to be done in near future.  8) Vit B12 def: most recent injection was 9 d/a. Vit b12 level today.  An After Visit Summary was printed and given to the patient.  FOLLOW UP: Return in about 4 months (around 01/30/2020) for annual CPE (fasting).  Signed:  Crissie Sickles, MD           09/30/2019

## 2019-09-30 NOTE — Telephone Encounter (Signed)
Patient just had OV today w/ B12 Labs drawn.  Will verify results prior to sending in new rx.

## 2019-10-01 ENCOUNTER — Ambulatory Visit: Payer: PPO | Admitting: Primary Care

## 2019-10-01 ENCOUNTER — Encounter: Payer: Self-pay | Admitting: Primary Care

## 2019-10-01 ENCOUNTER — Ambulatory Visit (INDEPENDENT_AMBULATORY_CARE_PROVIDER_SITE_OTHER): Payer: PPO | Admitting: Critical Care Medicine

## 2019-10-01 VITALS — BP 116/78 | HR 78 | Temp 97.4°F | Ht 64.0 in | Wt 201.4 lb

## 2019-10-01 DIAGNOSIS — R0600 Dyspnea, unspecified: Secondary | ICD-10-CM | POA: Diagnosis not present

## 2019-10-01 DIAGNOSIS — J984 Other disorders of lung: Secondary | ICD-10-CM | POA: Diagnosis not present

## 2019-10-01 DIAGNOSIS — J9611 Chronic respiratory failure with hypoxia: Secondary | ICD-10-CM

## 2019-10-01 DIAGNOSIS — I5022 Chronic systolic (congestive) heart failure: Secondary | ICD-10-CM | POA: Diagnosis not present

## 2019-10-01 DIAGNOSIS — I5042 Chronic combined systolic (congestive) and diastolic (congestive) heart failure: Secondary | ICD-10-CM

## 2019-10-01 DIAGNOSIS — J9601 Acute respiratory failure with hypoxia: Secondary | ICD-10-CM

## 2019-10-01 DIAGNOSIS — R0609 Other forms of dyspnea: Secondary | ICD-10-CM

## 2019-10-01 LAB — PULMONARY FUNCTION TEST
DL/VA % pred: 109 %
DL/VA: 4.44 ml/min/mmHg/L
DLCO cor % pred: 67 %
DLCO cor: 12.43 ml/min/mmHg
DLCO unc % pred: 67 %
DLCO unc: 12.51 ml/min/mmHg
FEF 25-75 Post: 1.72 L/sec
FEF 25-75 Pre: 1.02 L/sec
FEF2575-%Change-Post: 68 %
FEF2575-%Pred-Post: 149 %
FEF2575-%Pred-Pre: 88 %
FEV1-%Change-Post: 12 %
FEV1-%Pred-Post: 86 %
FEV1-%Pred-Pre: 76 %
FEV1-Post: 1.52 L
FEV1-Pre: 1.36 L
FEV1FVC-%Change-Post: 9 %
FEV1FVC-%Pred-Pre: 101 %
FEV6-%Change-Post: 2 %
FEV6-%Pred-Post: 84 %
FEV6-%Pred-Pre: 81 %
FEV6-Post: 1.89 L
FEV6-Pre: 1.84 L
FEV6FVC-%Change-Post: 0 %
FEV6FVC-%Pred-Post: 106 %
FEV6FVC-%Pred-Pre: 106 %
FVC-%Change-Post: 2 %
FVC-%Pred-Post: 79 %
FVC-%Pred-Pre: 77 %
FVC-Post: 1.89 L
FVC-Pre: 1.84 L
Post FEV1/FVC ratio: 81 %
Post FEV6/FVC ratio: 100 %
Pre FEV1/FVC ratio: 74 %
Pre FEV6/FVC Ratio: 100 %
RV % pred: 118 %
RV: 2.96 L
TLC % pred: 106 %
TLC: 5.37 L

## 2019-10-01 NOTE — Progress Notes (Signed)
Full PFT completed today ? ?

## 2019-10-01 NOTE — Assessment & Plan Note (Deleted)
-   Pulmonary function testing showed mild restriction with +BD response very similar to previous. Restriction either from body habitus and/or mild underlying asthma. Diffusion capacity % is improved. Patient is asymptomatic with no respiratory complaints. For now ok to continue Amiodarone 100mg  daily. If respiratory symptoms worse or you develop chest tightness/wheezing/cough notify office, may consider daily ICS maintenance inhaler.   Orders: Overnight oximetry on room air re: nocturnal hypoxia   Follow-up: 3-4 months with Dr. Carlis Abbott

## 2019-10-01 NOTE — Assessment & Plan Note (Signed)
-   Pulmonary function testing showed mild restriction with +BD response very similar to previous. Restriction either from body habitus and/or mild underlying asthma. Diffusion capacity % is improved. Patient is asymptomatic with no respiratory complaints. For now ok to continue Amiodarone 100mg  daily. If respiratory symptoms progress or worsen may consider daily ICS maintenance inhaler.   Follow-up: 3-4 months with Dr. Carlis Abbott

## 2019-10-01 NOTE — Progress Notes (Signed)
@Patient  ID: Amanda Sellers, female    DOB: 1932-05-16, 86 y.o.   MRN: [id number]  Chief Complaint  Patient presents with  . Follow-up    PFT performed today.  Pt states she has been doing good since last visit and denies any complaints.    Referring provider: Tammi Sou, MD  HPI: 86 year old female, never smoked. PMH significant for COPD/asthma, acute hypoxemic respiratory failure, HTN, CHF, CAD, cardiomyopathy, afib pulmonary hypertension, hypothyroidism, anemia. Former patient of Dr. Lenna Sellers, last seen by Dr. Carlis Sellers for new patient consult on 07/29/19. Ordered for ONO on RA to determine if she requires oxygen. Needs repeat PFTs to evaluate for accelerated decline that would be explained by amiodarone. Continue ongoing compliance with cardiology medication, fluid restriction and daily weights.  [facility] pulmonary encounter: 07/29/19- Dr. Carlis Sellers   Amanda Sellers is an 86 y/o woman with a history of HFrEF, paroxysmal atrial fibrillation on chronic amiodarone who presents for evaluation of shortness of breath.  She is accompanied today by her daughter Amanda Sellers.  She is formerly a patient of Dr. Lenna Sellers, last seen in 2019.  She was followed for chronic dyspnea on exertion and mild restriction due to body habitus and reduced DLCO on PFTs, but no deterioration on amiodarone. She continues on amiodarone 100 mg daily.  She was admitted for newly diagnosed acute systolic heart failure.  She underwent heart catheterization but had no amenable sites for PCI.  She was started on Entresto. Her recent discharge summary from April 2021 reports a diagnosis of COPD, which her previous office notes and PFTs do not support. Since that hospitalization she has only had mild lower extremity edema and PND twice.  She has chronic dyspnea on exertion that limits her activity, but her significant knee arthritis is more limiting.  She can walk around short distances without dyspnea on exertion, but is short of breath when she is  walking to the pulmonary office.  She denies wheezing, coughing, shortness of breath at rest.  There is no family history of lung disease.  She weighs herself daily and has been losing weight since hospitalization.  She is a never smoker/ vaper.  She has been on supplemental oxygen at night since 2016, which was prescribed by her oncologist.  She does not use it every night, only notes that she is short of breath.  She has never had overnight oximetry.  10/01/2019- Interim hx Patient presents today for 2 month follow-up. Accompanied by her daughter, Amanda Sellers. She is feeling very well. She has no respiratory complaints. Denies significant shortness of breath, cough or wheezing. She continues amiodarone 100mg  daily. Pulmonary function testing completed today. She has not been scheduled for overnight oximetry test. She uses nocturnal oxygen only as needed.   Pulmonary function testing 10/01/2019 - FVC 1.89 (79%), FEV1 1.52 (86%), ratio 81, DLCOcor 12.43 (67%) Interpretation: Mild restriction with positive BD response. Mild decreased diffusion capacity but stable % from previous  Allergies  Allergen Reactions  . Aspirin Itching  . Iodinated Diagnostic Agents Rash and Other (See Comments)    CT Contrast    Immunization History  Administered Date(s) Administered  . Fluad Quad(high Dose 65+) 10/14/2018, 09/30/2019  . Influenza, High Dose Seasonal PF 11/11/2013, 11/06/2016  . Influenza,inj,Quad PF,6+ Mos 10/27/2014, 10/04/2015, 10/05/2017  . Influenza-Unspecified 10/21/2012  . Moderna SARS-COVID-2 Vaccination 02/04/2019, 03/17/2019  . Pneumococcal Conjugate-13 02/07/2016  . Pneumococcal-Unspecified 01/23/2014  . Tdap 12/09/2014  . Zoster 03/19/2012    Past Medical History:  Diagnosis  Date  . Anemia of chronic disease   . Anticoagulation adequate, Eliquis 10/16/2014  . Anxiety    clonaz helps this AND her breathing  . Chronic combined systolic and diastolic congestive heart failure ([place])  16/10/9602   Complicated by atrial fibrillation; Echo 07/23/14: mild LVH, EF 50-55%, grade 2 diast dysfxn.;  Echo May 15, 2019: EF 35 to 40%, GRII DD.  (In setting of CHF exacerbation, class III)-> baseline class II symptoms  . Chronic renal insufficiency, stage III (moderate)    GFR @ 50  . COPD (chronic obstructive pulmonary disease) ([place])    recently noted on Xray, does not have any problems  . Follicular lymphoma ([place])    Non Hodgkins B cell lymphoma; s/p chemo summer 2016--in remission still as of 09/2018: continue obs q 40mo  . HLD (hyperlipidemia)     06/2014  . HTN (hypertension)   . Hypothyroidism   . IFG (impaired fasting glucose)   . Lip cancer    Dr. Janace Hoard excised this: invasive SCC--no sign of cancer at ENT f/u 10/2015  . Microcytic anemia    transfused 3 U total in hosp 07/2014  . Obesity (BMI 30-39.9)   . Olecranon bursitis of right elbow 02/2015  . Pancytopenia due to antineoplastic chemotherapy ([place])   . Persistent atrial fibrillation (HCC)    With RVR; elec cardioversion 11/30/14.  On Amio since 10/2014; Dr. Lenna Sellers following her from pulm standpoint regarding this med.  Marland Kitchen Positive occult stool blood test 08/08/14   Endoscopies ok 08/2014  . Primary osteoarthritis of both knees 04/2016   Dr. Gladstone Lighter: bone on bone--to get euflexxa to both knees.  . Pulmonary hypertension due to left heart disease ([place])    RLCP 05/19/2019: EF 35-40%. D1 50%, RI 70%, dLM-LAD 30%.  mPAP 43 mmHg - (62/19 mmHg), PCWP  28 mmHg (V wave 41 mmHg) LVEDP 34 mmHg  . Pulmonary metastases ([place]) 07/24/2014  . Vitamin B 12 deficiency   . Vitamin D deficiency    dose increased to 50 K U tab TWICE weekly 04/2017    Tobacco History: Social History   Tobacco Use  Smoking Status Never Smoker  Smokeless Tobacco Never Used   Counseling given: Not Answered   Outpatient Medications Prior to Visit  Medication Sig Dispense Refill  . amiodarone (PACERONE) 200 MG tablet Take 0.5 tablets (100 mg total) by mouth  daily. 90 tablet 2  . amLODipine (NORVASC) 2.5 MG tablet Take 1 tablet (2.5 mg total) by mouth daily. 90 tablet 3  . clonazePAM (KLONOPIN) 0.5 MG tablet TAKE 1/2 TO 1 TABLET BY MOUTH TWICE A DAY AS NEEDED (Patient taking differently: Take 0.25-0.5 mg by mouth 2 (two) times daily as needed. TAKE 1/2 TO 1 TABLET BY MOUTH TWICE A DAY AS NEEDED) 90 tablet 1  . cyanocobalamin (,VITAMIN B-12,) 1000 MCG/ML injection INJECT 1 ML INTO THE SKIN EVERY 30 DAYS. 3 mL 3  . ELIQUIS 5 MG TABS tablet TAKE 1 TABLET BY MOUTH TWICE A DAY (Patient taking differently: Take 5 mg by mouth 2 (two) times daily. ) 180 tablet 2  . ENTRESTO 24-26 MG TAKE 1 TABLET BY MOUTH 2 TIMES DAILY 60 tablet 1  . ferrous sulfate 325 (65 FE) MG tablet Take 1 tablet (325 mg total) by mouth 2 (two) times daily with a meal. 30 tablet 1  . fish oil-omega-3 fatty acids 1000 MG capsule Take 2 g by mouth daily.    . fluticasone (CUTIVATE) 0.05 % cream Apply to  affected area bid prn 30 g 2  . furosemide (LASIX) 40 MG tablet Take 1 tablet (40 mg total) by mouth daily. 30 tablet 2  . levothyroxine (SYNTHROID) 100 MCG tablet TAKE 1 TABLET BY MOUTH EVERY DAY BEFORE BREAKFAST 90 tablet 3  . OXYGEN Inhale into the lungs. 1Lt at bedtime    . potassium chloride 20 MEQ TBCR Take 20 mEq by mouth daily. 30 tablet 3  . rosuvastatin (CRESTOR) 20 MG tablet TAKE 1 TABLET BY MOUTH EVERY DAY 90 tablet 3  . traMADol (ULTRAM) 50 MG tablet TAKE 1 TABLET BY MOUTH TWICE A DAY AS NEEDED FOR PAIN (Patient taking differently: Take 50 mg by mouth every 12 (twelve) hours as needed. ) 60 tablet 3  . Vitamin D, Ergocalciferol, (DRISDOL) 1.25 MG (50000 UT) CAPS capsule TAKE 1 CAPSULE (50,000 UNITS TOTAL) BY MOUTH 2 (TWO) TIMES A WEEK. 24 capsule 3   No facility-administered medications prior to visit.    Review of Systems  Review of Systems  Constitutional: Negative.   Respiratory: Negative for cough, shortness of breath and wheezing.   Cardiovascular: Positive for leg  swelling.    Physical Exam  BP 116/78 (BP Location: Right Arm, Cuff Size: Normal)   Pulse 78   Temp (!) 97.4 F (36.3 C) (Other (Comment)) Comment (Src): wrist  Ht 5\' 4"  (1.626 m)   Wt 201 lb 6.4 oz (91.4 kg)   SpO2 97%   BMI 34.57 kg/m  Physical Exam Constitutional:      Appearance: Normal appearance.  HENT:     Head: Normocephalic and atraumatic.     Mouth/Throat:     Mouth: Mucous membranes are moist.     Pharynx: Oropharynx is clear.  Cardiovascular:     Rate and Rhythm: Normal rate and regular rhythm.     Comments: Chronic BLE edema +2, wearing compression stockings Pulmonary:     Effort: Pulmonary effort is normal.     Breath sounds: Normal breath sounds.     Comments: Very faint wheeze right lower lobe, otherwise clear  Musculoskeletal:        General: Normal range of motion.     Cervical back: Normal range of motion and neck supple.  Neurological:     General: No focal deficit present.     Mental Status: She is alert and oriented to person, place, and time. Mental status is at baseline.  Psychiatric:        Mood and Affect: Mood normal.        Behavior: Behavior normal.        Thought Content: Thought content normal.        Judgment: Judgment normal.      Lab Results:  CBC    Component Value Date/Time   WBC 3.5 (L) 09/30/2019 0916   RBC 4.34 09/30/2019 0916   HGB 13.6 09/30/2019 0916   HGB 12.0 04/18/2019 1037   HGB 10.6 (L) 10/09/2016 1146   HCT 40.6 09/30/2019 0916   HCT 33.0 (L) 10/09/2016 1146   PLT 119.0 (L) 09/30/2019 0916   PLT 127 (L) 04/18/2019 1037   PLT 184 10/09/2016 1146   MCV 93.6 09/30/2019 0916   MCV 81.6 10/09/2016 1146   MCH 29.4 05/21/2019 0545   MCHC 33.4 09/30/2019 0916   RDW 14.0 09/30/2019 0916   RDW 16.7 (H) 10/09/2016 1146   LYMPHSABS 1.1 09/30/2019 0916   LYMPHSABS 0.8 (L) 10/09/2016 1146   MONOABS 0.4 09/30/2019 0916   MONOABS 0.6 10/09/2016 1146  EOSABS 0.1 09/30/2019 0916   EOSABS 0.1 10/09/2016 1146    BASOSABS 0.0 09/30/2019 0916   BASOSABS 0.0 10/09/2016 1146    BMET    Component Value Date/Time   NA 143 09/30/2019 0916   NA 141 05/29/2019 1031   NA 143 11/17/2014 0801   K 4.5 09/30/2019 0916   K 3.6 11/17/2014 0801   CL 102 09/30/2019 0916   CO2 33 (H) 09/30/2019 0916   CO2 30 (H) 11/17/2014 0801   GLUCOSE 98 09/30/2019 0916   GLUCOSE 117 11/17/2014 0801   BUN 16 09/30/2019 0916   BUN 18 05/29/2019 1031   BUN 16.3 11/17/2014 0801   CREATININE 0.92 09/30/2019 0916   CREATININE 0.86 12/09/2014 0934   CREATININE 0.7 11/17/2014 0801   CALCIUM 8.9 09/30/2019 0916   CALCIUM 8.9 11/17/2014 0801   GFRNONAA 43 (L) 05/29/2019 1031   GFRAA 50 (L) 05/29/2019 1031    BNP    Component Value Date/Time   BNP 138.0 (H) 05/15/2019 0450    ProBNP No results found for: PROBNP  Imaging: No results found.   Assessment & Plan:   Restrictive lung disease - Pulmonary function testing showed mild restriction with +BD response very similar to previous. Restriction either from body habitus and/or mild underlying asthma. Diffusion capacity % is improved. Patient is asymptomatic with no respiratory complaints. For now ok to continue Amiodarone 100mg  daily. If respiratory symptoms progress or worsen may consider daily ICS maintenance inhaler.   Follow-up: 3-4 months with Dr. Carlis Sellers   Acute hypoxemic respiratory failure (HCC) - O2 97% RA today  - Order overnight oximetry on room air to further assess oxygen needs   Chronic combined systolic and diastolic CHF (congestive heart failure) ([place]) - Continue cardiac medications, Lasix 40mg  daily, fluid restriction and daily weights    Martyn Ehrich, NP 10/01/2019

## 2019-10-01 NOTE — Patient Instructions (Addendum)
Pulmonary function testing looks very similar to previous. You have mild restriction with positive bronchodilator response which could be from either body habitus and/or mild underlying asthma. Diffusion capacity % wise is better than previous. I will discuss with Dr. Carlis Abbott whether or not she is ok with you continuing Amiodarone. For now ok to continue taking as prescribed. If respiratory symptoms worse or you develop chest tightness/wheezing/cough notify office, may consider daily maintenance inhaler.   Orders: Overnight oximetry on room air re: nocturnal hypoxia   Follow-up: 3-4 months with Dr. Carlis Abbott

## 2019-10-01 NOTE — Assessment & Plan Note (Signed)
-   Continue cardiac medications, Lasix 40mg  daily, fluid restriction and daily weights

## 2019-10-01 NOTE — Assessment & Plan Note (Addendum)
-   O2 97% RA today  - Order overnight oximetry on room air to further assess oxygen needs

## 2019-10-02 ENCOUNTER — Telehealth: Payer: Self-pay | Admitting: Primary Care

## 2019-10-02 DIAGNOSIS — J9611 Chronic respiratory failure with hypoxia: Secondary | ICD-10-CM

## 2019-10-02 DIAGNOSIS — I5022 Chronic systolic (congestive) heart failure: Secondary | ICD-10-CM

## 2019-10-02 NOTE — Telephone Encounter (Signed)
Attempted to call pt but unable to reach. Left message for her to return call. 

## 2019-10-02 NOTE — Telephone Encounter (Signed)
Please let patient know I spoke with Dr. Carlis Abbott, she is also ok with continuing Amiodarone for now. She recommended re-checking PFTs in 6 months to continue to monitor DLCO. Dr. Carlis Abbott is transitioning out of office in 2022 so she should make follow-up with another pulmonary provider to continue to care for her.   Please order PFTs with DLCO in 6 months.

## 2019-10-03 NOTE — Telephone Encounter (Signed)
Called and spoke with pt's daughter Amanda Sellers letting her know the info stated by [facility] that PC said it was okay for pt to continue on amiodarone for now. Stated that PC wants to repeat PFT in 6 months and Amanda Sellers verbalized understanding. She has also been made aware that PC will be transitioning out of office and working only in hospital so in 6 months we will need to get pt established with a new pulmonologist. Stated that we will either call/send letter in mail for pt when time to make next appts and she verbalized understanding. Recall has been placed for PFT and also for 6 month follow up with Dr. Carlis Abbott but we will get pt with new pulmonologist at that time. We will get pt scheduled for appt when schedules open. Nothing further needed.

## 2019-10-09 ENCOUNTER — Ambulatory Visit (HOSPITAL_COMMUNITY): Payer: PPO | Attending: Cardiology

## 2019-10-09 ENCOUNTER — Other Ambulatory Visit: Payer: Self-pay

## 2019-10-09 DIAGNOSIS — I420 Dilated cardiomyopathy: Secondary | ICD-10-CM | POA: Diagnosis not present

## 2019-10-09 DIAGNOSIS — I480 Paroxysmal atrial fibrillation: Secondary | ICD-10-CM | POA: Insufficient documentation

## 2019-10-09 DIAGNOSIS — I5042 Chronic combined systolic (congestive) and diastolic (congestive) heart failure: Secondary | ICD-10-CM

## 2019-10-09 HISTORY — PX: TRANSTHORACIC ECHOCARDIOGRAM: SHX275

## 2019-10-09 LAB — ECHOCARDIOGRAM COMPLETE
Area-P 1/2: 3.27 cm2
P 1/2 time: 436 msec
S' Lateral: 2.8 cm

## 2019-10-10 ENCOUNTER — Encounter: Payer: Self-pay | Admitting: Family Medicine

## 2019-10-26 ENCOUNTER — Other Ambulatory Visit: Payer: Self-pay | Admitting: Family Medicine

## 2019-10-26 NOTE — Telephone Encounter (Signed)
Is this okay?

## 2019-11-18 ENCOUNTER — Other Ambulatory Visit (HOSPITAL_COMMUNITY): Payer: Self-pay | Admitting: Nurse Practitioner

## 2019-11-24 ENCOUNTER — Inpatient Hospital Stay: Payer: PPO | Attending: Oncology | Admitting: Oncology

## 2019-11-24 ENCOUNTER — Other Ambulatory Visit: Payer: Self-pay

## 2019-11-24 ENCOUNTER — Inpatient Hospital Stay: Payer: PPO

## 2019-11-24 VITALS — BP 178/100 | HR 68 | Temp 97.8°F | Resp 17 | Ht 64.0 in | Wt 203.1 lb

## 2019-11-24 DIAGNOSIS — D509 Iron deficiency anemia, unspecified: Secondary | ICD-10-CM | POA: Diagnosis not present

## 2019-11-24 DIAGNOSIS — C8200 Follicular lymphoma grade I, unspecified site: Secondary | ICD-10-CM

## 2019-11-24 DIAGNOSIS — Z85818 Personal history of malignant neoplasm of other sites of lip, oral cavity, and pharynx: Secondary | ICD-10-CM | POA: Insufficient documentation

## 2019-11-24 DIAGNOSIS — R0609 Other forms of dyspnea: Secondary | ICD-10-CM | POA: Diagnosis not present

## 2019-11-24 LAB — CBC WITH DIFFERENTIAL (CANCER CENTER ONLY)
Abs Immature Granulocytes: 0.01 10*3/uL (ref 0.00–0.07)
Basophils Absolute: 0 10*3/uL (ref 0.0–0.1)
Basophils Relative: 0 %
Eosinophils Absolute: 0.1 10*3/uL (ref 0.0–0.5)
Eosinophils Relative: 1 %
HCT: 42.2 % (ref 36.0–46.0)
Hemoglobin: 13.9 g/dL (ref 12.0–15.0)
Immature Granulocytes: 0 %
Lymphocytes Relative: 26 %
Lymphs Abs: 1.3 10*3/uL (ref 0.7–4.0)
MCH: 31.3 pg (ref 26.0–34.0)
MCHC: 32.9 g/dL (ref 30.0–36.0)
MCV: 95 fL (ref 80.0–100.0)
Monocytes Absolute: 0.4 10*3/uL (ref 0.1–1.0)
Monocytes Relative: 9 %
Neutro Abs: 3.1 10*3/uL (ref 1.7–7.7)
Neutrophils Relative %: 64 %
Platelet Count: 131 10*3/uL — ABNORMAL LOW (ref 150–400)
RBC: 4.44 MIL/uL (ref 3.87–5.11)
RDW: 12.9 % (ref 11.5–15.5)
WBC Count: 4.9 10*3/uL (ref 4.0–10.5)
nRBC: 0 % (ref 0.0–0.2)

## 2019-11-24 NOTE — Progress Notes (Signed)
  [facility] OFFICE PROGRESS NOTE   Diagnosis: Non-Hodgkin's lymphoma  INTERVAL HISTORY:   Amanda Sellers returns as scheduled.  She was admitted in April with heart failure.  She feels well at present.  No fever, night sweats, or palpable lymph nodes.  Good appetite.  She is working.  She has received COVID-19 and influenza vaccines.  Objective:  Vital signs in last 24 hours:  Blood pressure (!) 178/100, pulse 68, temperature 97.8 F (36.6 C), temperature source Tympanic, resp. rate 17, height _0  (1.626 m), weight 203 lb 1.6 oz (92.1 kg), SpO2 100 %.    HEENT: Neck without mass Lymphatics: No cervical, supraclavicular, axillary, or inguinal nodes Resp: Lungs clear bilaterally Cardio: Regular rate and rhythm GI: Nontender, no mass, no hepatosplenomegaly Vascular: No leg edema   Lab Results:  Lab Results  Component Value Date   WBC 4.9 11/24/2019   HGB 13.9 11/24/2019   HCT 42.2 11/24/2019   MCV 95.0 11/24/2019   PLT 131 (L) 11/24/2019   NEUTROABS 3.1 11/24/2019    CMP  Lab Results  Component Value Date   NA 143 09/30/2019   K 4.5 09/30/2019   CL 102 09/30/2019   CO2 33 (H) 09/30/2019   GLUCOSE 98 09/30/2019   BUN 16 09/30/2019   CREATININE 0.92 09/30/2019   CALCIUM 8.9 09/30/2019   PROT 6.2 09/30/2019   ALBUMIN 4.4 09/30/2019   AST 18 09/30/2019   ALT 16 09/30/2019   ALKPHOS 40 09/30/2019   BILITOT 0.8 09/30/2019   GFRNONAA 43 (L) 05/29/2019   GFRAA 50 (L) 05/29/2019    Medications: I have reviewed the patient's current medications.   Assessment/Plan: 1.Non-Hodgkin's Lymphoma-bone marrow biopsy 07/24/2014 consistent with involvement by follicular B-cell lymphoma, CD20 positive   CTs of the chest 07/22/2014 and CT of the abdomen and pelvis on 07/23/2014-pulmonary nodules, hilar/mediastinal/supraclavicular/axillary adenopathy, splenomegaly, abdominal adenopathy, bilateral adrenal nodules   Cycle 1 bendamustine/rituximab  08/04/2014  Cycle 2 bendamustine/Rituxan 09/01/2014  Cycle 3 bendamustine/Rituxan 09/30/2014  Cycle 4 bendamustine/rituximab 10/27/2014  Restaging CT scans 11/17/2014 with significant improvement in diffuse lymphadenopathy, lung nodules, and renal lesions 2. Severe microcytic anemia, status post transfusion with packed red blood cells 07/23/2014   Normal ferritin, low transferrin saturation, "scant" bone marrow iron stores, review of peripheral blood smear consistent with iron deficiency anemia   colonoscopy and upper endoscopy 09/23/2014 3. Exertional dyspnea /orthopnea-most likely secondary to congestive heart failure  4. History of B-12 deficiency  5. Rash over the trunk and extremities 08/18/2014. Appears consistent with a drug rash. Resolved 08/28/2014.  Rash 11/18/2014, likely secondary to a contrast allergy, diagnostic contrast will be listed as an allergy 6. Colonoscopy 06/03/2008. Medium sized internal hemorrhoids.  7. Admission with atrial flutter with a rapid ventricular response 10/15/2014-status post cardioversion 10/16/2014, maintained on  apixaban  Repeat cardioversion 11/30/2014  8. Right olecranon skin lesion-likely a lipoma or cyst 9. 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  She will return for an office and lab visit in 8 months.  Amanda Coder, MD  11/24/2019  4:01 PM

## 2019-11-25 ENCOUNTER — Telehealth: Payer: Self-pay | Admitting: Oncology

## 2019-11-25 NOTE — Telephone Encounter (Signed)
Scheduled appointments per 11/1 los. Called patient, no answer. Left message for patient with appointment date and time.

## 2019-11-26 ENCOUNTER — Other Ambulatory Visit: Payer: Self-pay | Admitting: *Deleted

## 2019-11-26 MED ORDER — APIXABAN 5 MG PO TABS: 5.0000 mg | ORAL_TABLET | Freq: Two times a day (BID) | ORAL | 2 refills | Status: DC

## 2019-12-02 ENCOUNTER — Other Ambulatory Visit: Payer: Self-pay | Admitting: Family Medicine

## 2019-12-04 NOTE — Telephone Encounter (Signed)
Not prescribed by you is this okay?

## 2019-12-06 ENCOUNTER — Other Ambulatory Visit: Payer: Self-pay | Admitting: Cardiology

## 2020-01-01 ENCOUNTER — Ambulatory Visit: Payer: PPO | Admitting: Cardiology

## 2020-01-24 DIAGNOSIS — E46 Unspecified protein-calorie malnutrition: Secondary | ICD-10-CM

## 2020-01-24 HISTORY — DX: Unspecified protein-calorie malnutrition: E46

## 2020-01-30 ENCOUNTER — Encounter: Payer: PPO | Admitting: Family Medicine

## 2020-02-17 ENCOUNTER — Ambulatory Visit: Payer: PPO | Admitting: Cardiology

## 2020-02-23 ENCOUNTER — Ambulatory Visit (INDEPENDENT_AMBULATORY_CARE_PROVIDER_SITE_OTHER): Payer: PPO | Admitting: Family Medicine

## 2020-02-23 ENCOUNTER — Other Ambulatory Visit: Payer: Self-pay

## 2020-02-23 ENCOUNTER — Encounter: Payer: Self-pay | Admitting: Family Medicine

## 2020-02-23 VITALS — BP 156/75 | HR 68 | Temp 97.4°F | Resp 16 | Ht 63.0 in | Wt 203.6 lb

## 2020-02-23 DIAGNOSIS — Z7901 Long term (current) use of anticoagulants: Secondary | ICD-10-CM

## 2020-02-23 DIAGNOSIS — I482 Chronic atrial fibrillation, unspecified: Secondary | ICD-10-CM

## 2020-02-23 DIAGNOSIS — R7301 Impaired fasting glucose: Secondary | ICD-10-CM

## 2020-02-23 DIAGNOSIS — E7800 Pure hypercholesterolemia, unspecified: Secondary | ICD-10-CM

## 2020-02-23 DIAGNOSIS — I10 Essential (primary) hypertension: Secondary | ICD-10-CM

## 2020-02-23 DIAGNOSIS — E039 Hypothyroidism, unspecified: Secondary | ICD-10-CM

## 2020-02-23 DIAGNOSIS — Z0000 Encounter for general adult medical examination without abnormal findings: Secondary | ICD-10-CM

## 2020-02-23 DIAGNOSIS — N183 Chronic kidney disease, stage 3 unspecified: Secondary | ICD-10-CM

## 2020-02-23 LAB — LIPID PANEL
Cholesterol: 176 mg/dL (ref 0–200)
HDL: 71 mg/dL (ref >39.00)
LDL Cholesterol: 74 mg/dL (ref 0–99)
NonHDL: 105.23
Total CHOL/HDL Ratio: 2
Triglycerides: 158 mg/dL — ABNORMAL HIGH (ref 0.0–149.0)
VLDL: 31.6 mg/dL (ref 0.0–40.0)

## 2020-02-23 LAB — CBC WITH DIFFERENTIAL/PLATELET
Basophils Absolute: 0 10*3/uL (ref 0.0–0.1)
Basophils Relative: 0.8 % (ref 0.0–3.0)
Eosinophils Absolute: 0.1 10*3/uL (ref 0.0–0.7)
Eosinophils Relative: 1.9 % (ref 0.0–5.0)
HCT: 41.4 % (ref 36.0–46.0)
Hemoglobin: 14 g/dL (ref 12.0–15.0)
Lymphocytes Relative: 28.9 % (ref 12.0–46.0)
Lymphs Abs: 0.9 10*3/uL (ref 0.7–4.0)
MCHC: 33.9 g/dL (ref 30.0–36.0)
MCV: 92.7 fl (ref 78.0–100.0)
Monocytes Absolute: 0.3 10*3/uL (ref 0.1–1.0)
Monocytes Relative: 11.5 % (ref 3.0–12.0)
Neutro Abs: 1.7 10*3/uL (ref 1.4–7.7)
Neutrophils Relative %: 56.9 % (ref 43.0–77.0)
Platelets: 117 10*3/uL — ABNORMAL LOW (ref 150.0–400.0)
RBC: 4.47 Mil/uL (ref 3.87–5.11)
RDW: 13.1 % (ref 11.5–15.5)
WBC: 3 10*3/uL — ABNORMAL LOW (ref 4.0–10.5)

## 2020-02-23 LAB — COMPREHENSIVE METABOLIC PANEL
ALT: 16 U/L (ref 0–35)
AST: 19 U/L (ref 0–37)
Albumin: 4.4 g/dL (ref 3.5–5.2)
Alkaline Phosphatase: 41 U/L (ref 39–117)
BUN: 18 mg/dL (ref 6–23)
CO2: 32 mEq/L (ref 19–32)
Calcium: 9.2 mg/dL (ref 8.4–10.5)
Chloride: 101 mEq/L (ref 96–112)
Creatinine, Ser: 0.9 mg/dL (ref 0.40–1.20)
GFR: 58.26 mL/min — ABNORMAL LOW (ref >60.00)
Glucose, Bld: 89 mg/dL (ref 70–99)
Potassium: 4.6 mEq/L (ref 3.5–5.1)
Sodium: 140 mEq/L (ref 135–145)
Total Bilirubin: 0.7 mg/dL (ref 0.2–1.2)
Total Protein: 6.4 g/dL (ref 6.0–8.3)

## 2020-02-23 LAB — TSH: TSH: 2.83 u[IU]/mL (ref 0.35–4.50)

## 2020-02-23 LAB — HEMOGLOBIN A1C: Hgb A1c MFr Bld: 5.5 % (ref 4.6–6.5)

## 2020-02-23 NOTE — Progress Notes (Signed)
Office Note 02/23/2020  CC:  Chief Complaint  Patient presents with  . Annual Exam    Pt is not fasting    HPI:  Amanda Sellers is a 87 y.o. White female who is here for annual health maintenance exam and for f/u hypothyroidism, CRI III, HTN. She has chronic hypox RF chronic combined syst/diast HF and persistent a-fib---followed by specialists.  BPs at home <130/80 consistently->"always runs high at doctor's office".  amlod 2.5 qd, entresto 24-26 1 tab bid  Compression hose helping A LOT for LE swelling. Taking lasix 58m qd.  CRI: clear fluids about 50 oz/day. Avoids NSAIDs.  Hypoth: taking 100 mcg T4 on empty stomach daily.  10/01/19 PULM MD reassessment:  PFTs stable mild restriction w/+ BD resp, dec diff cap but stable-->ok to cont amiodarone. Ovenight oximetry testing ordered->pt states she never got contacted about doing this..  Pain: hx of staking some tramadol occ for bilat knee osteoarth but rarely taking lately at all.   Takes clonaz SELDOM for anxiety.  PMP AWARE reviewed today: most recent rx for tramadol 527mwas filled 04/19/19, # [id number] by me. Most recent rx for clonaz 0.61m32milled 02/15/19, #90, rx by me. No red flags.  Past Medical History:  Diagnosis Date  . Anemia of chronic disease   . Anticoagulation adequate, Eliquis 10/16/2014  . Anxiety    clonaz helps this AND her breathing  . Chronic combined systolic and diastolic congestive heart failure (HCCConchas Dam9/10/25/8527Complicated by atrial fibrillation; Echo 07/23/14: mild LVH, EF 50-55%, grade 2 diast dysfxn.;  Echo May 15, 2019: EF 35 to 40%, GRII DD.  (In setting of CHF exacerbation, class III)-> baseline class II symptoms. 09/2019 EF 55-60%, nl wall motion and LV fxn, grd II DD, mod MR.  . Chronic renal insufficiency, stage III (moderate) (HCC)    GFR @ 50  . COPD (chronic obstructive pulmonary disease) ([place]CCKittery Point  recently noted on Xray, does not have any problems  . Follicular lymphoma (HCCR[place]ellville  Non  Hodgkins B cell lymphoma; s/p chemo summer 2016--in remission still as of 09/2018: continue obs q 51mo97moHLD (hyperlipidemia)     06/2014  . HTN (hypertension)   . Hypothyroidism   . IFG (impaired fasting glucose)   . Lip cancer    Dr. ByerJanace Hoardised this: invasive SCC--no sign of cancer at ENT f/u 10/2015  . Microcytic anemia    transfused 3 U total in hosp 07/2014  . Obesity (BMI 30-39.9)   . Olecranon bursitis of right elbow 02/2015  . Pancytopenia due to antineoplastic chemotherapy (HCC)[place]. Persistent atrial fibrillation (HCC)    With RVR; elec cardioversion 11/30/14.  On Amio since 10/2014; Dr. NadeLenna Gilfordlowing her from pulm standpoint regarding this med.  . PoMarland Kitchenitive occult stool blood test 08/08/14   Endoscopies ok 08/2014  . Primary osteoarthritis of both knees 04/2016   Dr. GiofGladstone Lighterne on bone--to get euflexxa to both knees.  . Pulmonary hypertension due to left heart disease (HCC)[place] RLCP 05/19/2019: EF 35-40%. D1 50%, RI 70%, dLM-LAD 30%.  mPAP 43 mmHg - (62/19 mmHg), PCWP  28 mmHg (V wave 41 mmHg) LVEDP 34 mmHg  . Pulmonary metastases (HCC)[place]1/2016  . Vitamin B 12 deficiency   . Vitamin D deficiency    dose increased to 50 K U tab TWICE weekly 04/2017    Past Surgical History:  Procedure Laterality Date  . ABDOMINAL HYSTERECTOMY  [place] BIOPSY  07/24/14  . BREAST BIOPSY    . CARDIOVERSION N/A 10/16/2014   Procedure: CARDIOVERSION;  Surgeon: Lelon Perla, MD;  Location: [facility] ENDOSCOPY;  Service: Cardiovascular;  Laterality: N/A;  . CARDIOVERSION N/A 11/30/2014   Procedure: CARDIOVERSION;  Surgeon: Larey Dresser, MD;  Location: [facility];  Service: Cardiovascular;  Laterality: N/A;  . CATARACT EXTRACTION  2020   OU: Dr. Katy Fitch  . COLONOSCOPY  09/23/14   small hemorrhoids, otherwise normal (performed for IDA and heme+ stool)  . COLONOSCOPY    . MASS EXCISION Left 07/15/2015   Left lower lip mass--invasive SCC w/negative margins.  Procedure: EXCISION MASS;   Surgeon: Melissa Montane, MD;  Location: [facility];  Service: ENT;  Laterality: Left;  Wedge excision left lower lip mass  . NM MYOVIEW LTD  10/29/2014   medium size mild surgery defect in the mid anterior and apical anterior location suggestive of breast attenuation. No reversibility. LOW RISK  . PFTs  02/2015   Restriction with diffusion defect: cardiologist referred her to pulm to help interpret PFTs and decide whether she has amiodarone toxicity  . RIGHT/LEFT HEART CATH AND CORONARY ANGIOGRAPHY N/A 05/19/2019   Procedure: RIGHT/LEFT HEART CATH AND CORONARY ANGIOGRAPHY;  Surgeon: Troy Sine, MD;  Location: [facility] CV LAB;; EF 35-40%. D1 50%, RI 70%, dLM-LAD 30%.  mPAP 43 mmHg - (62/19 mmHg), PCWP  28 mmHg (V wave 41 mmHg) LVEDP 34 mmHg  . TEE WITHOUT CARDIOVERSION N/A 10/16/2014   Procedure: TRANSESOPHAGEAL ECHOCARDIOGRAM (TEE);  Surgeon: Lelon Perla, MD;  Location[facility] ENDOSCOPY;  Service: Cardiovascular;  Laterality: N/A;  . TRANSTHORACIC ECHOCARDIOGRAM  07/23/14   mild LVH, EF 50-55%, grade 2 diast dysfxn  . TRANSTHORACIC ECHOCARDIOGRAM  05/15/2019; 10/09/19   Acute CHF exacerbation:  EF 35-40%.  Moderately reduced function.  (Previous EF was 55 to 60%) global HK.  GRII DD.  Mild LA dilation.  Normal RV size. 09/2019 EF 55-60%, normal LV motion/fxn, grd II DD, mod MR.  Marland Kitchen UPPER GI ENDOSCOPY  09/23/2014   small hiatus hernia, nodules in stomach biopsied (chronic active erosive atrophic gastritis with intestinal metaplasia--no dysplasia or malignancy) otherwise normal    Family History  Problem Relation Age of Onset  . Melanoma Mother   . Stomach cancer Father   . Lymphoma Sister   . Diabetes Daughter     Social History   Socioeconomic History  . Marital status: Married    Spouse name: Not on file  . Number of children: 8  . Years of education: Not on file  . Highest education level: Not on file  Occupational History  . Occupation: retired  Tobacco Use  . Smoking status: Never  Smoker  . Smokeless tobacco: Never Used  Vaping Use  . Vaping Use: Never used  Substance and Sexual Activity  . Alcohol use: No    Alcohol/week: 0.0 standard drinks  . Drug use: No  . Sexual activity: Not on file  Other Topics Concern  . Not on file  Social History Narrative   Widowed, has 8 children.   Orig from [place], now lives in [place].   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   No tob/alc/drugs.   Tries to walk, but her back starts to hurt.  Has to use a walker.   Social Determinants of Health   Financial Resource Strain: Not on file  Food Insecurity: Not on file  Transportation Needs: Not on file  Physical Activity: Not on file  Stress: Not on file  Social Connections: Not on file  Intimate Partner Violence: Not on file    Outpatient Medications Prior to Visit  Medication Sig Dispense Refill  . amiodarone (PACERONE) 200 MG tablet Take 0.5 tablets (100 mg total) by mouth daily. 90 tablet 2  . amLODipine (NORVASC) 2.5 MG tablet Take 1 tablet (2.5 mg total) by mouth daily. 90 tablet 3  . apixaban (ELIQUIS) 5 MG TABS tablet Take 1 tablet (5 mg total) by mouth 2 (two) times daily. 180 tablet 2  . clonazePAM (KLONOPIN) 0.5 MG tablet TAKE 1/2 TO 1 TABLET BY MOUTH TWICE A DAY AS NEEDED (Patient taking differently: Take 0.25-0.5 mg by mouth 2 (two) times daily as needed. TAKE 1/2 TO 1 TABLET BY MOUTH TWICE A DAY AS NEEDED) 90 tablet 1  . cyanocobalamin (,VITAMIN B-12,) 1000 MCG/ML injection INJECT 1 ML INTO THE SKIN EVERY 30 DAYS. 3 mL 3  . ENTRESTO 24-26 MG TAKE 1 TABLET BY MOUTH TWICE A DAY 60 tablet 1  . ferrous sulfate 325 (65 FE) MG tablet Take 1 tablet (325 mg total) by mouth 2 (two) times daily with a meal. 30 tablet 1  . fish oil-omega-3 fatty acids 1000 MG capsule Take 2 g by mouth daily.    . fluticasone (CUTIVATE) 0.05 % cream Apply to affected area bid prn 30 g 2  . furosemide (LASIX) 40 MG tablet Take 1 tablet (40 mg  total) by mouth daily. 90 tablet 1  . levothyroxine (SYNTHROID) 100 MCG tablet TAKE 1 TABLET BY MOUTH EVERY DAY BEFORE BREAKFAST 90 tablet 3  . OXYGEN Inhale into the lungs. 1Lt at bedtime    . Potassium Chloride ER 20 MEQ TBCR TAKE 1 TABLET BY MOUTH EVERY DAY 90 tablet 1  . rosuvastatin (CRESTOR) 20 MG tablet TAKE 1 TABLET BY MOUTH EVERY DAY 90 tablet 3  . traMADol (ULTRAM) 50 MG tablet TAKE 1 TABLET BY MOUTH TWICE A DAY AS NEEDED FOR PAIN 60 tablet 3  . Vitamin D, Ergocalciferol, (DRISDOL) 1.25 MG (50000 UNIT) CAPS capsule TAKE 1 CAPSULE (50,000 UNITS TOTAL) BY MOUTH 2 (TWO) TIMES A WEEK. 24 capsule 3   No facility-administered medications prior to visit.    Allergies  Allergen Reactions  . Aspirin Itching  . Iodinated Diagnostic Agents Rash and Other (See Comments)    CT Contrast    ROS Review of Systems  Constitutional: Negative for appetite change, chills, fatigue and fever.  HENT: Negative for congestion, dental problem, ear pain and sore throat.   Eyes: Negative for discharge, redness and visual disturbance.  Respiratory: Negative for cough, chest tightness, shortness of breath and wheezing.   Cardiovascular: Negative for chest pain, palpitations and leg swelling.  Gastrointestinal: Negative for abdominal pain, blood in stool, diarrhea, nausea and vomiting.  Genitourinary: Negative for difficulty urinating, dysuria, flank pain, frequency, hematuria and urgency.  Musculoskeletal: Negative for arthralgias, back pain, joint swelling, myalgias and neck stiffness.  Skin: Negative for pallor and rash.  Neurological: Negative for dizziness, speech difficulty, weakness and headaches.  Hematological: Negative for adenopathy. Does not bruise/bleed easily.  Psychiatric/Behavioral: Negative for confusion and sleep disturbance. The patient is not nervous/anxious.    PE; Vitals with BMI 02/23/2020 11/24/2019 11/24/2019  Height '5\' 3"'  - '5\' 4"'   Weight 203 lbs 10 oz - 203 lbs 2 oz  BMI 21.22 -  48.25  Systolic 003 704 888  Diastolic 75 916  82  Pulse 68 68 76     Exam chaperoned by Shepard General, CMA Gen: Alert, well appearing.  Patient is oriented to person, place, time, and situation. AFFECT: pleasant, lucid thought and speech. ENT: Ears: EACs clear, normal epithelium.  TMs with good light reflex and landmarks bilaterally.  Eyes: no injection, icteris, swelling, or exudate.  EOMI, PERRLA. Nose: no drainage or turbinate edema/swelling.  No injection or focal lesion.  Mouth: lips without lesion/swelling.  Oral mucosa pink and moist.  Dentition intact and without obvious caries or gingival swelling.  Oropharynx without erythema, exudate, or swelling.  Neck: supple/nontender.  No LAD, mass, or TM.  Carotid pulses 2+ bilaterally, without bruits. CV: RRR, no m/r/g.   LUNGS: CTA bilat, nonlabored resps, good aeration in all lung fields. ABD: soft, NT, ND, BS normal.  No hepatospenomegaly or mass.  No bruits. EXT: no clubbing or cyanosis. 2+ bilat LL pitting edema is palpable through her compression stockings.  Musculoskeletal: no joint swelling, erythema, warmth, or tenderness.  ROM of all joints intact. Skin - no sores or suspicious lesions or rashes or color changes   Pertinent labs:  Lab Results  Component Value Date   TSH 3.62 09/30/2019   Lab Results  Component Value Date   WBC 4.9 11/24/2019   HGB 13.9 11/24/2019   HCT 42.2 11/24/2019   MCV 95.0 11/24/2019   PLT 131 (L) 11/24/2019   Lab Results  Component Value Date   CREATININE 0.92 09/30/2019   BUN 16 09/30/2019   NA 143 09/30/2019   K 4.5 09/30/2019   CL 102 09/30/2019   CO2 33 (H) 09/30/2019   Lab Results  Component Value Date   ALT 16 09/30/2019   AST 18 09/30/2019   ALKPHOS 40 09/30/2019   BILITOT 0.8 09/30/2019   Lab Results  Component Value Date   CHOL 165 09/30/2019   Lab Results  Component Value Date   HDL 66.90 09/30/2019   Lab Results  Component Value Date   LDLCALC 66 09/30/2019    Lab Results  Component Value Date   TRIG 157.0 (H) 09/30/2019   Lab Results  Component Value Date   CHOLHDL 2 09/30/2019   Lab Results  Component Value Date   HGBA1C 5.6 11/04/2018   ASSESSMENT AND PLAN:   1) HTN: stable. Cont amlod 2.93m qd and entresto 24-26, 1 bid. Lytes/cr today.  2) Hypothyroidism: taking 100 mcg T4 correctly. TSH today.  3) CRI III: avoids NSAIDs and hydrates adequately. Lytes/cr today.  4) Health maintenance exam: Reviewed age and gender appropriate health maintenance issues (prudent diet, regular exercise, health risks of tobacco and excessive alcohol, use of seatbelts, fire alarms in home, use of sunscreen).  Also reviewed age and gender appropriate health screening as well as vaccine recommendations. Vaccines: ALL UTD. Labs: fasting lipids, cmet, cbc, tsh. Cervical ca screening: no further screening. Breast ca screening: no further screening. Colon ca screening: not indicated.  An After Visit Summary was printed and given to the patient.  FOLLOW UP:  Return in about 4 months (around 06/22/2020) for routine chronic illness f/u.  Signed:  PCrissie Sickles MD           02/23/2020

## 2020-03-01 ENCOUNTER — Encounter: Payer: Self-pay | Admitting: Cardiology

## 2020-03-01 ENCOUNTER — Ambulatory Visit: Payer: PPO | Admitting: Cardiology

## 2020-03-01 ENCOUNTER — Other Ambulatory Visit: Payer: Self-pay

## 2020-03-01 VITALS — BP 187/71 | HR 74 | Ht 66.0 in | Wt 207.2 lb

## 2020-03-01 DIAGNOSIS — I2510 Atherosclerotic heart disease of native coronary artery without angina pectoris: Secondary | ICD-10-CM | POA: Diagnosis not present

## 2020-03-01 DIAGNOSIS — I5042 Chronic combined systolic (congestive) and diastolic (congestive) heart failure: Secondary | ICD-10-CM

## 2020-03-01 DIAGNOSIS — Z79899 Other long term (current) drug therapy: Secondary | ICD-10-CM | POA: Diagnosis not present

## 2020-03-01 DIAGNOSIS — I480 Paroxysmal atrial fibrillation: Secondary | ICD-10-CM | POA: Diagnosis not present

## 2020-03-01 DIAGNOSIS — J984 Other disorders of lung: Secondary | ICD-10-CM

## 2020-03-01 DIAGNOSIS — Z7901 Long term (current) use of anticoagulants: Secondary | ICD-10-CM

## 2020-03-01 MED ORDER — AMLODIPINE BESYLATE 2.5 MG PO TABS: 2.5000 mg | ORAL_TABLET | Freq: Every day | ORAL | 3 refills | Status: DC

## 2020-03-01 NOTE — Progress Notes (Signed)
Primary Care Provider: Tammi Sou, MD Cardiologist: Glenetta Hew, MD Electrophysiologist: None  Clinic Note: Chief Complaint  Patient presents with  . Follow-up    Doing well  . Atrial Fibrillation  . Congestive Heart Failure    Stable anemia.   ===================================  ASSESSMENT/PLAN   Problem List Items Addressed This Visit    Paroxysmal atrial fibrillation ([place]); CHA2DS2Vasc 6 - Eliquis; Amio 100 mg daily - Primary (Chronic)    Symptomatically, seems that she is maintaining sinus rhythm on amiodarone 100 mg daily.  No longer on beta-blocker because the combination was in the bradycardia.  With the 100 a.m. dose she is maintaining stable heart rate.  Tolerating Eliquis without any bleeding issues.  Plan: Continue low-dose amiodarone with Eliquis.      Relevant Medications   amLODipine (NORVASC) 2.5 MG tablet   Coronary artery disease involving native coronary artery of native heart without angina pectoris (Chronic)    Mild, nonobstructive CAD.  No active angina. Not on beta-blocker but is on amiodarone. On Entresto. On statin.  Not on aspirin because of Eliquis.      Relevant Medications   amLODipine (NORVASC) 2.5 MG tablet   Chronic combined systolic and diastolic CHF (congestive heart failure) (HCC) (Chronic)    EF now back up to normal after recent check.  I suspect that she became volume overloaded with left atrial enlargement and MR.  The study worsening EF is probably more functional while in A. Fib.  Has been fine since sinus rhythm restored.  Plan  Not on beta-blocker-on amiodarone -> hoping to maintain sinus rhythm  On stable dose of Entresto-reluctant to increase dose further because of hypotension issues.  She is now taking furosemide 40 mg -- apparently she has been taking this at nighttime; convert to a.m. dosing.      Relevant Medications   amLODipine (NORVASC) 2.5 MG tablet   On amiodarone therapy (Chronic)    Continue  annual PFT checks -> would also check ESR and CRP with LFTs/TFTs.  Continue routine LFTs, TFTs and annual eye exams.      Anticoagulation adequate, Eliquis (Chronic)    Tolerating Eliquis well with no bleeding issues.  Still having issues with donut hole.  Release Eliquis not being covered by her insurance.      Restrictive lung disease (Chronic)    Appreciate pulmonary following up.  She does have some baseline restrictive lung disease, but no change in diffusion capacity.  Okay to maintain amiodarone therapy.  We will need annual follow-up.         ===================================  HPI:    Amanda Sellers is a 87 y.o. female with a PMH notable for labile HTN, PAF, HFpEF (EF 35 to 40% April 2021 in setting of acute volume overload related to A. fib-now back to 55 to 60% as of September 2021), COPD (nightly oxygen) who presents today for 50-monthfollow-up.  -> She was admitted in April 2021 with acute on chronic combined systolic diastolic heart failure and pulmonary venous hypertension in setting of A. fib RVR.  [facility]uresis, [facility] to [facility]%.  Patient on amiodarone and Eliquis.  SDearra MyhandFriddle was last seen on September 01, 2019.  She noted stable lower extremity edema.  Usually goes down with feet up at night.  No PND, orthopnea.  Still using support stockings.  Trying to monitor her diet to ensure low-salt.  Still wearing nighttime oxygen.  Not very much exercise, doing her daily living chores including laundry and vacuuming.  Has to sit down every now and then but no chest pain or pressure.  No more than usual dyspnea.  No signs of recurrent A. Fib. -> ROS: Coughing, shortness of breath and wheezing.  Chronic LE edema.  Back pain/joint pain.  Positional dizziness.  Left leg weakness.  Difficulty falling asleep when she wakes up.  Echo ordered; no med changes.  Beta-blocker stopped because of bradycardia.  Continues on oral  Lasix 40 mg daily.  Recent Hospitalizations: None  A. fib clinic follow-up August 27, 2019 => maintaining sinus rhythm with amiodarone daily.  Monitor symptoms.  Having difficulty affording Entresto and Eliquis. -> She was on a break from sitting with her elderly friend called because the drainage recently suffered a fall and broken hip. => Financial assistance provided for Eliquis and Entresto  Reviewed  CV studies:    The following studies were reviewed today: (if available, images/films reviewed: From Epic Chart or Care Everywhere) . TTE 10/05/2019: EF 55 to 60%.  GRII DD.  No R WMA.  Normal PAP and RAP.  Severe LA dilation.  Mild RA dilation.  Moderate MR (likely related to annular dilation in the setting of severe LAE.  Mild aortic valve sclerosis-no stenosis.  Interval History:   Amanda Sellers Herrod returns here today overall feeling fairly well on cardiac standpoint.  She is walks, but is limited by balance issues.  She may get a little short of breath if she walks at a faster pace, goes up and incline/significant amount of steps. She really is not noting any significant orthopnea or PND.  Her edema is also pretty well stable, she is not wearing compression hose routinely.  Medically, she has not noted any irregular heartbeats or palpitations.  CV Review of Symptoms (Summary): positive for - dyspnea on exertion, edema and both essentially stable. negative for - chest pain, irregular heartbeat, orthopnea, palpitations, paroxysmal nocturnal dyspnea, rapid heart rate, shortness of breath or Although she may have some occasional positional dizziness, no syncope or near syncope, TIA or amaurosis fugax symptoms.  No claudication.  The patient does not have symptoms concerning for COVID-19 infection (fever, chills, cough, or new shortness of breath).   REVIEWED OF SYSTEMS   ROS   I have reviewed and (if needed) personally updated the patient's problem list, medications, allergies, past  medical and surgical history, social and family history.   PAST MEDICAL HISTORY   Past Medical History:  Diagnosis Date  . Anemia of chronic disease   . Anticoagulation adequate, Eliquis 10/16/2014  . Anxiety    clonaz helps this AND her breathing  . Chronic combined systolic and diastolic congestive heart failure ([place]) 66/44/0347   Complicated by atrial fibrillation; Echo 07/23/14: mild LVH, EF 50-55%, Gr 2 DD.;  Echo May 15, 2019: EF 35 to 40%, GRII DD.  (In setting of CHF exacerbation, class III)-> baseline class II symptoms. 09/2019 EF 55-60%, nl wall motion and LV fxn, grd II DD, mod MR.  . Chronic renal insufficiency, stage III (moderate) (HCC)    GFR @ 50  . COPD (chronic obstructive pulmonary disease) ([place])    recently noted on Xray, does not have any problems  . Follicular lymphoma ([place])    Non Hodgkins B cell lymphoma; s/p chemo summer 2016--in remission still as of 09/2018: continue obs q 46mo . HLD (hyperlipidemia)     06/2014  . HTN (hypertension)   .  Hypothyroidism   . IFG (impaired fasting glucose)   . Lip cancer    Dr. Janace Hoard excised this: invasive SCC--no sign of cancer at ENT f/u 10/2015  . Microcytic anemia    transfused 3 U total in hosp 07/2014  . Obesity (BMI 30-39.9)   . Olecranon bursitis of right elbow 02/2015  . Pancytopenia due to antineoplastic chemotherapy ([place])   . Persistent atrial fibrillation (HCC)    With RVR; elec cardioversion 11/30/14.  On Amio since 10/2014; Dr. Lenna Gilford following her from pulm standpoint regarding this med.  Marland Kitchen Positive occult stool blood test 08/08/14   Endoscopies ok 08/2014  . Primary osteoarthritis of both knees 04/2016   Dr. Gladstone Lighter: bone on bone--to get euflexxa to both knees.  . Pulmonary hypertension due to left heart disease ([place])    RLCP 05/19/2019: EF 35-40%. D1 50%, RI 70%, dLM-LAD 30%.  mPAP 43 mmHg - (62/19 mmHg), PCWP  28 mmHg (V wave 41 mmHg) LVEDP 34 mmHg  . Pulmonary metastases ([place]) 07/24/2014  . Vitamin B 12  deficiency   . Vitamin D deficiency    dose increased to 50 K U tab TWICE weekly 04/2017    PAST SURGICAL HISTORY   Past Surgical History:  Procedure Laterality Date  . ABDOMINAL HYSTERECTOMY  1978  . BONE MARROW BIOPSY  07/24/14  . BREAST BIOPSY    . CARDIOVERSION N/A 10/16/2014   Procedure: CARDIOVERSION;  Surgeon: Lelon Perla, MD;  Location: [facility] ENDOSCOPY;  Service: Cardiovascular;  Laterality: N/A;  . CARDIOVERSION N/A 11/30/2014   Procedure: CARDIOVERSION;  Surgeon: Larey Dresser, MD;  Location: [facility];  Service: Cardiovascular;  Laterality: N/A;  . CATARACT EXTRACTION  2020   OU: Dr. Katy Fitch  . COLONOSCOPY  09/23/14   small hemorrhoids, otherwise normal (performed for IDA and heme+ stool)  . COLONOSCOPY    . MASS EXCISION Left 07/15/2015   Left lower lip mass--invasive SCC w/negative margins.  Procedure: EXCISION MASS;  Surgeon: Melissa Montane, MD;  Location: [place];  Service: ENT;  Laterality: Left;  Wedge excision left lower lip mass  . NM MYOVIEW LTD  10/29/2014   medium size mild surgery defect in the mid anterior and apical anterior location suggestive of breast attenuation. No reversibility. LOW RISK  . PFTs  02/2015   Restriction with diffusion defect: cardiologist referred her to pulm to help interpret PFTs and decide whether she has amiodarone toxicity  . RIGHT/LEFT HEART CATH AND CORONARY ANGIOGRAPHY N/A 05/19/2019   Procedure: RIGHT/LEFT HEART CATH AND CORONARY ANGIOGRAPHY;  Surgeon: Troy Sine, MD;  Location: [place] [facility] CV LAB;; EF 35-40%. D1 50%, RI 70%, dLM-LAD 30%.  mPAP 43 mmHg - (62/19 mmHg), PCWP  28 mmHg (V wave 41 mmHg) LVEDP 34 mmHg  . TEE WITHOUT CARDIOVERSION N/A 10/16/2014   Procedure: TRANSESOPHAGEAL ECHOCARDIOGRAM (TEE);  Surgeon: Lelon Perla, MD;  Location: [facility] ENDOSCOPY;  Service: Cardiovascular;  Laterality: N/A;  . TRANSTHORACIC ECHOCARDIOGRAM  10/09/2019   EF 55 to 60%.  GRII DD.  No R WMA.  Normal PAP and RAP.  Severe LA dilation.  Mild RA  dilation.  Moderate MR (likely related to annular dilation in the setting of severe LAE.  Mild aortic valve sclerosis-no stenosis.  . TRANSTHORACIC ECHOCARDIOGRAM  05/15/2019   Acute CHF exacerbation:  EF 35-40%.  Moderately reduced function.  (Previous EF was 55 to 60%) global HK.  GRII DD.  Mild LA dilation.  Normal RV size.  Marland Kitchen UPPER GI ENDOSCOPY  09/23/2014  small hiatus hernia, nodules in stomach biopsied (chronic active erosive atrophic gastritis with intestinal metaplasia--no dysplasia or malignancy) otherwise normal    RLCP 05/19/2019: EF 35-40%. D1 50%, RI 70%, dLM-LAD 30%.  mPAP 43 mmHg - (62/19 mmHg), PCWP  28 mmHg (V wave 41 mmHg) LVEDP 34 mmHg    Immunization History  Administered Date(s) Administered  . Fluad Quad(high Dose 65+) 10/14/2018, 09/30/2019  . Influenza, High Dose Seasonal PF 11/11/2013, 11/06/2016  . Influenza,inj,Quad PF,6+ Mos 10/27/2014, 10/04/2015, 10/05/2017  . Influenza-Unspecified 10/21/2012  . Moderna Sars-Covid-2 Vaccination 02/04/2019, 03/17/2019, 11/18/2019  . Pneumococcal Conjugate-13 02/07/2016  . Pneumococcal-Unspecified 01/23/2014  . Tdap 12/09/2014  . Zoster 03/19/2012    MEDICATIONS/ALLERGIES   Current Meds  Medication Sig  . amiodarone (PACERONE) 200 MG tablet Take 0.5 tablets (100 mg total) by mouth daily.  Marland Kitchen apixaban (ELIQUIS) 5 MG TABS tablet Take 1 tablet (5 mg total) by mouth 2 (two) times daily.  . clonazePAM (KLONOPIN) 0.5 MG tablet TAKE 1/2 TO 1 TABLET BY MOUTH TWICE A DAY AS NEEDED (Patient taking differently: Take 0.25-0.5 mg by mouth 2 (two) times daily as needed. TAKE 1/2 TO 1 TABLET BY MOUTH TWICE A DAY AS NEEDED)  . cyanocobalamin (,VITAMIN B-12,) 1000 MCG/ML injection INJECT 1 ML INTO THE SKIN EVERY 30 DAYS.  . ferrous sulfate 325 (65 FE) MG tablet Take 1 tablet (325 mg total) by mouth 2 (two) times daily with a meal.  . fish oil-omega-3 fatty acids 1000 MG capsule Take 2 g by mouth daily.  . fluticasone (CUTIVATE) 0.05 %  cream Apply to affected area bid prn  . furosemide (LASIX) 40 MG tablet Take 1 tablet (40 mg total) by mouth daily.  Marland Kitchen levothyroxine (SYNTHROID) 100 MCG tablet TAKE 1 TABLET BY MOUTH EVERY DAY BEFORE BREAKFAST  . OXYGEN Inhale into the lungs. 1Lt at bedtime  . Potassium Chloride ER 20 MEQ TBCR TAKE 1 TABLET BY MOUTH EVERY DAY  . rosuvastatin (CRESTOR) 20 MG tablet TAKE 1 TABLET BY MOUTH EVERY DAY  . traMADol (ULTRAM) 50 MG tablet TAKE 1 TABLET BY MOUTH TWICE A DAY AS NEEDED FOR PAIN  . Vitamin D, Ergocalciferol, (DRISDOL) 1.25 MG (50000 UNIT) CAPS capsule TAKE 1 CAPSULE (50,000 UNITS TOTAL) BY MOUTH 2 (TWO) TIMES A WEEK.  . [DISCONTINUED] amLODipine (NORVASC) 2.5 MG tablet Take 1 tablet (2.5 mg total) by mouth daily.  . [DISCONTINUED] ENTRESTO 24-26 MG TAKE 1 TABLET BY MOUTH TWICE A DAY    Allergies  Allergen Reactions  . Aspirin Itching  . Iodinated Diagnostic Agents Rash and Other (See Comments)    CT Contrast    SOCIAL HISTORY/FAMILY HISTORY   Reviewed in Epic:  Pertinent findings:  Social History   Tobacco Use  . Smoking status: Never Smoker  . Smokeless tobacco: Never Used  Vaping Use  . Vaping Use: Never used  Substance Use Topics  . Alcohol use: No    Alcohol/week: 0.0 standard drinks  . Drug use: No   Social History   Social History Narrative   Widowed, has 8 children.   Orig from [place], now lives in [place].   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   No tob/alc/drugs.   Tries to walk, but her back starts to hurt.  Has to use a walker.    OBJCTIVE -PE, EKG, labs   Wt Readings from Last 3 Encounters:  03/01/20 207 lb 3.2 oz (94 kg)  02/23/20 203 lb  9.6 oz (92.4 kg)  11/24/19 203 lb 1.6 oz (92.1 kg)    Physical Exam: BP (!) 187/71   Pulse 74   Ht _0  (1.676 m)   Wt 207 lb 3.2 oz (94 kg)   SpO2 98%   BMI 33.44 kg/m ; She said this morning blood pressure was 134/82 mmHg Physical Exam Vitals reviewed.   Constitutional:      General: She is not in acute distress.    Appearance: Normal appearance. She is obese. She is not ill-appearing or toxic-appearing.     Comments: Healthy-appearing.  Well-groomed  HENT:     Head: Normocephalic and atraumatic.  Neck:     Vascular: No carotid bruit, hepatojugular reflux or JVD.  Cardiovascular:     Rate and Rhythm: Normal rate and regular rhythm.  No extrasystoles are present.    Chest Wall: PMI is not displaced.     Pulses: Intact distal pulses.     Heart sounds: S1 normal and S2 normal. Murmur heard.  High-pitched harsh crescendo-decrescendo midsystolic murmur is present with a grade of 1/6 at the upper right sternal border radiating to the neck. No friction rub. Gallop present. S4 sounds present.   Pulmonary:     Effort: Pulmonary effort is normal. No respiratory distress.     Breath sounds: Normal breath sounds.  Chest:     Chest wall: No tenderness.  Abdominal:     General: Bowel sounds are normal. There is no distension.     Palpations: Abdomen is soft. There is no mass.     Comments: No HSM  Musculoskeletal:        General: Swelling (Bilateral LE edema.  R>L) present. Normal range of motion.     Cervical back: Normal range of motion and neck supple.  Skin:    General: Skin is warm and dry.  Neurological:     General: No focal deficit present.     Mental Status: She is alert and oriented to person, place, and time.     Cranial Nerves: No cranial nerve deficit.     Motor: No weakness.     Gait: Gait abnormal (Slow, wide-based).  Psychiatric:        Mood and Affect: Mood normal.        Behavior: Behavior normal.        Thought Content: Thought content normal.        Judgment: Judgment normal.     Adult ECG Report N/a . Recent Labs: Reviewed Lab Results  Component Value Date   CHOL 176 02/23/2020   HDL 71.00 02/23/2020   LDLCALC 74 02/23/2020   LDLDIRECT 106.0 05/17/2018   TRIG 158.0 (H) 02/23/2020   CHOLHDL 2 02/23/2020    Lab Results  Component Value Date   CREATININE 0.90 02/23/2020   BUN 18 02/23/2020   NA 140 02/23/2020   K 4.6 02/23/2020   CL 101 02/23/2020   CO2 32 02/23/2020   CBC Latest Ref Rng & Units 02/23/2020 11/24/2019 09/30/2019  WBC 4.0 - 10.5 K/uL 3.0(L) 4.9 3.5(L)  Hemoglobin 12.0 - 15.0 g/dL 14.0 13.9 13.6  Hematocrit 36.0 - 46.0 % 41.4 42.2 40.6  Platelets 150.0 - 400.0 K/uL 117.0(L) 131(L) 119.0(L)    Lab Results  Component Value Date   TSH 2.83 02/23/2020    ==================================================  COVID-19 Education: The signs and symptoms of COVID-19 were discussed with the patient and how to seek care for testing (follow up with PCP or arrange E-visit).  The importance of social distancing and COVID-19 vaccination was discussed today. The patient is practicing social distancing & Masking.   I spent a total of 20 minutes with the patient spent in direct patient consultation.  Additional time spent with chart review  / charting (studies, outside notes, etc): 16 min Total Time: 36 min  Current medicines are reviewed at length with the patient today.  (+/- concerns) N/A  This visit occurred during the SARS-CoV-2 public health emergency.  Safety protocols were in place, including screening questions prior to the visit, additional usage of staff PPE, and extensive cleaning of exam room while observing appropriate contact time as indicated for disinfecting solutions.  Notice: This dictation was prepared with Dragon dictation along with smaller phrase technology. Any transcriptional errors that result from this process are unintentional and may not be corrected upon review.  Patient Instructions / Medication Changes & Studies & Tests Ordered   Patient Instructions  Medication Instructions:  If top number of blood pressure is 160 or greater, wait 1 hour and re-take. If blood pressure is still elevated take an additional Amlodipine (norvasc) 2.62m.   *If you need a  refill on your cardiac medications before your next appointment, please call your pharmacy*   Follow-Up: [facility] and your health needs are our priority.  As part of our continuing mission to provide you with exceptional heart care, we have created designated Provider Care Teams.  These Care Teams include your primary Cardiologist (physician) and Advanced Practice Providers (APPs -  Physician Assistants and Nurse Practitioners) who all work together to provide you with the care you need, when you need it.  We recommend signing up for the patient portal called "MyChart".  Sign up information is provided on this After Visit Summary.  MyChart is used to connect with patients for Virtual Visits (Telemedicine).  Patients are able to view lab/test results, encounter notes, upcoming appointments, etc.  Non-urgent messages can be sent to your provider as well.   To learn more about what you can do with MyChart, go to [email or web address]    Your next appointment:   6 month(s)  The format for your next appointment:   In Person  Provider:   DGlenetta Hew MD     Studies Ordered:   No orders of the defined types were placed in this encounter.    DGlenetta Hew M.D., M.S. Interventional Cardiologist   Pager # [phone] # [phone] [address] [place] [address]  Thank you for choosing [facility]!

## 2020-03-01 NOTE — Patient Instructions (Signed)
Medication Instructions:  If top number of blood pressure is 160 or greater, wait 1 hour and re-take. If blood pressure is still elevated take an additional Amlodipine (norvasc) 2.5mg .   *If you need a refill on your cardiac medications before your next appointment, please call your pharmacy*   Follow-Up: At [facility], you and your health needs are our priority.  As part of our continuing mission to provide you with exceptional heart care, we have created designated Provider Care Teams.  These Care Teams include your primary Cardiologist (physician) and Advanced Practice Providers (APPs -  Physician Assistants and Nurse Practitioners) who all work together to provide you with the care you need, when you need it.  We recommend signing up for the patient portal called "MyChart".  Sign up information is provided on this After Visit Summary.  MyChart is used to connect with patients for Virtual Visits (Telemedicine).  Patients are able to view lab/test results, encounter notes, upcoming appointments, etc.  Non-urgent messages can be sent to your provider as well.   To learn more about what you can do with MyChart, go to [email or web address].    Your next appointment:   6 month(s)  The format for your next appointment:   In Person  Provider:   Glenetta Hew, MD

## 2020-03-12 MED ORDER — ENTRESTO 24-26 MG PO TABS: 1.0000 | ORAL_TABLET | Freq: Two times a day (BID) | ORAL | 11 refills | Status: DC

## 2020-03-15 ENCOUNTER — Encounter: Payer: Self-pay | Admitting: Cardiology

## 2020-03-15 NOTE — Assessment & Plan Note (Signed)
Appreciate pulmonary following up.  She does have some baseline restrictive lung disease, but no change in diffusion capacity.  Okay to maintain amiodarone therapy.  We will need annual follow-up.

## 2020-03-15 NOTE — Assessment & Plan Note (Signed)
Continue annual PFT checks -> would also check ESR and CRP with LFTs/TFTs.  Continue routine LFTs, TFTs and annual eye exams.

## 2020-03-15 NOTE — Assessment & Plan Note (Signed)
Tolerating Eliquis well with no bleeding issues.  Still having issues with donut hole.  Release Eliquis not being covered by her insurance.

## 2020-03-15 NOTE — Assessment & Plan Note (Signed)
Symptomatically, seems that she is maintaining sinus rhythm on amiodarone 100 mg daily.  No longer on beta-blocker because the combination was in the bradycardia.  With the 100 a.m. dose she is maintaining stable heart rate.  Tolerating Eliquis without any bleeding issues.  Plan: Continue low-dose amiodarone with Eliquis.

## 2020-03-15 NOTE — Assessment & Plan Note (Signed)
Mild, nonobstructive CAD.  No active angina. Not on beta-blocker but is on amiodarone. On Entresto. On statin.  Not on aspirin because of Eliquis.

## 2020-03-15 NOTE — Assessment & Plan Note (Addendum)
EF now back up to normal after recent check.  I suspect that Amanda Sellers became volume overloaded with left atrial enlargement and MR.  The study worsening EF is probably more functional while in A. Fib.  Has been fine since sinus rhythm restored.  Plan  Not on beta-blocker-on amiodarone -> hoping to maintain sinus rhythm  On stable dose of Entresto-reluctant to increase dose further because of hypotension issues.  Amanda Sellers is now taking furosemide 40 mg -- apparently Amanda Sellers has been taking this at nighttime; convert to a.m. dosing.

## 2020-03-29 ENCOUNTER — Other Ambulatory Visit: Payer: Self-pay | Admitting: Cardiology

## 2020-04-30 ENCOUNTER — Ambulatory Visit (HOSPITAL_COMMUNITY)
Admission: RE | Admit: 2020-04-30 | Discharge: 2020-04-30 | Disposition: A | Discharge status: Home / Self Care | Payer: PPO | Source: Ambulatory Visit | Attending: Nurse Practitioner | Admitting: Nurse Practitioner

## 2020-04-30 ENCOUNTER — Encounter (HOSPITAL_COMMUNITY): Payer: Self-pay | Admitting: Nurse Practitioner

## 2020-04-30 ENCOUNTER — Other Ambulatory Visit: Payer: Self-pay

## 2020-04-30 ENCOUNTER — Other Ambulatory Visit: Payer: Self-pay | Admitting: Family Medicine

## 2020-04-30 VITALS — BP 136/70 | HR 57 | Ht 66.0 in | Wt 206.0 lb

## 2020-04-30 DIAGNOSIS — N183 Chronic kidney disease, stage 3 unspecified: Secondary | ICD-10-CM | POA: Insufficient documentation

## 2020-04-30 DIAGNOSIS — R0602 Shortness of breath: Secondary | ICD-10-CM | POA: Diagnosis not present

## 2020-04-30 DIAGNOSIS — I480 Paroxysmal atrial fibrillation: Secondary | ICD-10-CM

## 2020-04-30 DIAGNOSIS — I130 Hypertensive heart and chronic kidney disease with heart failure and stage 1 through stage 4 chronic kidney disease, or unspecified chronic kidney disease: Secondary | ICD-10-CM | POA: Insufficient documentation

## 2020-04-30 DIAGNOSIS — I4891 Unspecified atrial fibrillation: Secondary | ICD-10-CM | POA: Diagnosis not present

## 2020-04-30 DIAGNOSIS — D6869 Other thrombophilia: Secondary | ICD-10-CM

## 2020-04-30 DIAGNOSIS — Z7901 Long term (current) use of anticoagulants: Secondary | ICD-10-CM | POA: Diagnosis not present

## 2020-04-30 DIAGNOSIS — Z79899 Other long term (current) drug therapy: Secondary | ICD-10-CM | POA: Insufficient documentation

## 2020-04-30 DIAGNOSIS — I5042 Chronic combined systolic (congestive) and diastolic (congestive) heart failure: Secondary | ICD-10-CM | POA: Diagnosis not present

## 2020-04-30 DIAGNOSIS — Z8572 Personal history of non-Hodgkin lymphomas: Secondary | ICD-10-CM | POA: Insufficient documentation

## 2020-04-30 NOTE — Progress Notes (Signed)
Patient ID: Amanda Sellers, female   DOB: [birth year], 87 y.o.   MRN: [id number]           Primary Care Physician: Jeoffrey Massed, MD Referring Physician: Dr. Richardo Hanks DENIJAH Amanda Sellers is a 87 y.o. female with a h/o  diastolic heart failure, afib, newly diagnosed with recent dx of lymphoma. She was admitted to [facility] 9/22 on IV dilt and given diuretics for acute on chronic systolic HF. It was arranged for to undergo TEE DCCV.  She had exertional dyspnea with PND & orthopnea.She felt very tired & had little energy. She  noted a few palpitations, but does not recall rapid irregular heart rates/rhythms.   She had TEE without thrombus and underwent DCCV that was successful with one shock of 120J. She did well post procedure and was seen by Dr. Judie Petit. Croitoru and found stable for discharge. She was negative 2700cc since admit and weight was down 4 lbs with IV diuretics. D/C wt is 168.  Plan on d/c was for  uninterrupted anticoagulation for next 30 days, preferably lifelong barring bleeding complications. AFib clinic follow up in 1-2 weeks. Outpatient [facility], non-urgent. ASA stopped.   She was seen in the afib clinic, 10/10 and found to be in afib with rvr. She went into afib short time after cardioversion. EKG showed afib at 130-140 bpm. She was  not on any drugs for rate control.  Is taking DOAC without fail. Continues with chemotherapy for lymphoma.   She was started on BB and  returned to clinic 10/12 with better v rates. 80's with sitting and 90-100's with activity. She does not have any PND/orthopnea although she has slept better in her recliner for months now.No lower extremity edema. Her weight is stable at home.  She has  finished chemotherapy for non- hodgkin's lymphoma and had f/u Pet scan 10/26,  and CA appears to be in remission, so per pt, no more chemo for right now. She has been on DOAC now x one month so will start attempts to restore SR. Discussed options  amiodarone or tikosyn and she would prefer amiodarone as she has two neighbors on amiodarone and they are both in rhythm and tolerating drug well. She is not interested in a hospital stay for drug loading. She reports rash with recent contrast dye. Amiodarone was started 200 mg bid, to then go down to 200 mg a day and then would discuss cardioversion.  She asked to be seen 11/2,  in the afib clinic for increased shortness of breath yesterday and last night. She has slept in the recliner for months but did not feel as well last night and slept poorly, feeling more short of breath and fullness in her chest.She has noticed increased pedal edema. She did not take her amiodarone last night or this am. Ekg shows rate controlled afib at 82 bpm, qtc at 464 ms.PO on RA is 98%. Weight is up 2 lbs from last visit, 6 lbs since 10/17. Lasix dose was decreased to 40 mg daily from 40 mg am and 20 mg pm when seen by Dr. Herbie Baltimore 10/22.. Pt has noticed weight climbing for several days. She is fairly comfortable when quiet and sitting.   She was asked to double lasix to 80 mg  x 3 days, and to reduce amiodarone 200 mg a day. She had lost 3 pounds on return to clinic several days later. Clinically, she is much improved with more energy and less shortness of  breath. Able to sleep well. Had enough energy to walk into clinic today without wheelchair and felt well enough to clean her house yesterday. Ideally,I would like to load amiodarone a while longer but will go ahead and  pursue DCCV sooner than later, as it is contributing to her heart failure. Compliant with blood thinner without missed doses On recent labs, TSH was elevated but daughter explained that she just started taking replacement correctly on an empty stomach, and sees PCP 11/16 and she expects him to adjust the dose.  11/14-  Returns to afib clinic with a smile on her face and reporting all the activities she has been doing over the last week, having returned to  sinus rhythm after cardioversion. Much more energy and breathing is much improved.  Fluid status is stable.  12/13- Returns to afib clinic for f/u and reports doing well No afib, no further shortness of breath or edema. She is back to her usual activities. Got a good report form the Ca unit recently. Tolerating amiodarone 200 mg a day.  Return to afib clinic 4/18, reports that she is staying in [place]. Saw pulmonology yesterday for some c/o of shortness of breath but breathing much improved since staying in SR. Xray no active process, thyroid, liver studies drawn yesterday, and normal. MD said he would call hr after results of tests were reviewed. Fluid status normal.  F/u in afib clinic 01/31/16, pt continues to do well without any afib. Her fluid status is normal. Last TSH, 3 months ago, showed mild increase and will check TSH and liver panel today. Her breathing is good other than the last week in the bitter cold weather  she does not breath as good outside.No bleeding issues with eliquis. Fluid status is stable on lasix.  F/u in afib 01/26/17  for shortness of breath. Pt has been having shortness of breath for some while but contributed it to her knees and difficultly in ambulating. She was seen by pulmonology in July with improved PFT's and was OK to continue amiodarone. She was seen in November by Dr. Ellyn Hack and shortness of breath was thought to be stable/chronic. She feels that her shortness of breath has worsened since Christmas.  If she walks to the bathroom and back she is entirely out of breath. Sleeps on 2 pillows and has not felt the need to increase to elevate her head more at night. Ekg today shows junctional rhythm at 46 bpm. She is breathless speaking and started walking to clinic room but had to stop and get w/c. PO 99%.  F/u in afib clinic 1/7, after reducing amiodarone to 1/2 tab a day as well BB to 1/2 tab a day for brady as well as increase in shortness of breath. She is feeling much better  today to the point she was able to walk to clinic and not use w/c. She is not breathless talking.  F/u in afib clinic, 1/23, She feels great! Saw her pulmonologist recently and she said he was very pleased with her lung status. She is walking with a walker and observed coming into the clinic, no exertional dyspnea noted.  Her  HR is Sinus in the 40's today, but she is not symptomatic. She checks her HR with a pulse ox several times a day and it usually runs around 53-60.   F/u in afib clinic, 08/15/17. She is feeling well. Just saw Dr. Lenna Gilford and was given a good report. Ekg, SR with HR's in the 50's. 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 She has some chromic LE, which was worse after recent knee injections.. She did increase lasix for a couple of days and now back to baseline now.  F/u in afib clinic, 1/8, she is doing very well. She is in SR and has not noted any afib. 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 No LLE. She walks with a walker, no falls. No shortness of breath. On amiodarone 100 mg qd. Notices a little numbness in her distal fingertips intermittently.No issues with anticoagulation.  F/u in 07/31/18. Continues on amiodarone for afib management. She is in [place] today and has not noted any irregular heart rhythm. Her main complaint is that she has noted more LLE and has been back to sleeping in her recliner. She continues on lasix 40 mg bid. Avoids salt and salty foods and sits with her feet up during the day. No change in her health other than increase in  edema. She does drink 8 + glasses of water a day and may need to back off that some.weight is up 7 lbs when compared to last in person office visit in March.  F/u 03/11/19. She states that she feels great. She is in SR, no appreciated afib. Continues on amiodarone.  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 No pedal edema or shortness of breath. No issues with eliquis with a CHA2DS2VASc score of 5.  F/u in afib clinic,  09/16/19. She remains on amiodarone 100 mg daily and is staying in SR. She was hospitalized for fluid retention/shortness of breath in April 2021. Had a LHC, had some CAD but thought to be best treated medically. She was placed on entresto and amlodipine. She has done well since then. Denies any dyspnea  today. She continues to sit with the elderly, but now has a break, as the woman she was staying with fell and broke her hip and is still in the hospital. She is currently in the donut hole with entresto and eliquis, being her most expensive drugs.   F/u in afib clinic, 04/30/20. She remains in SR with  On 200 mg amiodarone daily.  It was increased  with she had afib with RVR/HF hospitalization last spring. Labs checked in January with normal liver enzymes and normal TSH. She has yearly PFT's with her pulmonologist. She has some chronic shortness of breath, at baseline. She is still sitting with a lady in her 59's for 4 days a week.   Today, she denies symptoms of palpitations, chest pain,  orthopnea, PND, mild baseline chronic lower extremity edema, no dizziness, presyncope, syncope, or neurologic sequela.  The patient is tolerating medications without difficulties and is otherwise without complaint today.   Past Medical History:  Diagnosis Date  . Anemia of chronic disease   . Anticoagulation adequate, Eliquis 10/16/2014  . Anxiety    clonaz helps this AND her breathing  . Chronic combined systolic and diastolic congestive heart failure ([place]) 62/22/9798   Complicated by atrial fibrillation; Echo 07/23/14: mild LVH, EF 50-55%, Gr 2 DD.;  Echo May 15, 2019: EF 35 to 40%, GRII DD.  (In setting of CHF exacerbation, class III)-> baseline class II symptoms. 09/2019 EF 55-60%, nl wall motion and LV fxn, grd II DD, mod MR.  . Chronic renal insufficiency, stage III (moderate) (HCC)    GFR @ 50  . COPD (chronic obstructive pulmonary disease) ([place])    recently noted  on Xray, does not have any problems  .  Follicular lymphoma ([place])    Non Hodgkins B cell lymphoma; s/p chemo summer 2016--in remission still as of 09/2018: continue obs q 19mo  . HLD (hyperlipidemia)     06/2014  . HTN (hypertension)   . Hypothyroidism   . IFG (impaired fasting glucose)   . Lip cancer    Dr. Janace Hoard excised this: invasive SCC--no sign of cancer at ENT f/u 10/2015  . Microcytic anemia    transfused 3 U total in hosp 07/2014  . Obesity (BMI 30-39.9)   . Olecranon bursitis of right elbow 02/2015  . Pancytopenia due to antineoplastic chemotherapy ([place])   . Persistent atrial fibrillation (HCC)    With RVR; elec cardioversion 11/30/14.  On Amio since 10/2014; Dr. Lenna Gilford following her from pulm standpoint regarding this med.  Marland Kitchen Positive occult stool blood test 08/08/14   Endoscopies ok 08/2014  . Primary osteoarthritis of both knees 04/2016   Dr. Gladstone Lighter: bone on bone--to get euflexxa to both knees.  . Pulmonary hypertension due to left heart disease ([place])    RLCP 05/19/2019: EF 35-40%. D1 50%, RI 70%, dLM-LAD 30%.  mPAP 43 mmHg - (62/19 mmHg), PCWP  28 mmHg (V wave 41 mmHg) LVEDP 34 mmHg  . Pulmonary metastases ([place]) 07/24/2014  . Vitamin B 12 deficiency   . Vitamin D deficiency    dose increased to 50 K U tab TWICE weekly 04/2017   Past Surgical History:  Procedure Laterality Date  . ABDOMINAL HYSTERECTOMY  1978  . BONE MARROW BIOPSY  07/24/14  . BREAST BIOPSY    . CARDIOVERSION N/A 10/16/2014   Procedure: CARDIOVERSION;  Surgeon: Lelon Perla, MD;  Location: [facility] ENDOSCOPY;  Service: Cardiovascular;  Laterality: N/A;  . CARDIOVERSION N/A 11/30/2014   Procedure: CARDIOVERSION;  Surgeon: Larey Dresser, MD;  Location: [facility];  Service: Cardiovascular;  Laterality: N/A;  . CATARACT EXTRACTION  2020   OU: Dr. Katy Fitch  . COLONOSCOPY  09/23/14   small hemorrhoids, otherwise normal (performed for IDA and heme+ stool)  . COLONOSCOPY    . MASS EXCISION Left 07/15/2015   Left lower lip mass--invasive SCC w/negative margins.   Procedure: EXCISION MASS;  Surgeon: Melissa Montane, MD;  Location: [place];  Service: ENT;  Laterality: Left;  Wedge excision left lower lip mass  . NM MYOVIEW LTD  10/29/2014   medium size mild surgery defect in the mid anterior and apical anterior location suggestive of breast attenuation. No reversibility. LOW RISK  . PFTs  02/2015   Restriction with diffusion defect: cardiologist referred her to pulm to help interpret PFTs and decide whether she has amiodarone toxicity  . RIGHT/LEFT HEART CATH AND CORONARY ANGIOGRAPHY N/A 05/19/2019   Procedure: RIGHT/LEFT HEART CATH AND CORONARY ANGIOGRAPHY;  Surgeon: Troy Sine, MD;  Location: [facility] CV LAB;; EF 35-40%. D1 50%, RI 70%, dLM-LAD 30%.  mPAP 43 mmHg - (62/19 mmHg), PCWP  28 mmHg (V wave 41 mmHg) LVEDP 34 mmHg  . TEE WITHOUT CARDIOVERSION N/A 10/16/2014   Procedure: TRANSESOPHAGEAL ECHOCARDIOGRAM (TEE);  Surgeon: Lelon Perla, MD;  Location: [facility] ENDOSCOPY;  Service: Cardiovascular;  Laterality: N/A;  . TRANSTHORACIC ECHOCARDIOGRAM  10/09/2019   EF 55 to 60%.  GRII DD.  No R WMA.  Normal PAP and RAP.  Severe LA dilation.  Mild RA dilation.  Moderate MR (likely related to annular dilation in the setting of severe LAE.  Mild aortic valve sclerosis-no stenosis.  Marland Kitchen  TRANSTHORACIC ECHOCARDIOGRAM  05/15/2019   Acute CHF exacerbation:  EF 35-40%.  Moderately reduced function.  (Previous EF was 55 to 60%) global HK.  GRII DD.  Mild LA dilation.  Normal RV size.  Marland Kitchen UPPER GI ENDOSCOPY  09/23/2014   small hiatus hernia, nodules in stomach biopsied (chronic active erosive atrophic gastritis with intestinal metaplasia--no dysplasia or malignancy) otherwise normal    Current Outpatient Medications  Medication Sig Dispense Refill  . amiodarone (PACERONE) 200 MG tablet TAKE ONE TABLET BY MOUTH EVERY DAY. 90 tablet 3  . amLODipine (NORVASC) 2.5 MG tablet Take 1 tablet (2.5 mg total) by mouth daily. 105 tablet 3  . apixaban (ELIQUIS) 5 MG TABS tablet Take 1  tablet (5 mg total) by mouth 2 (two) times daily. 180 tablet 2  . clonazePAM (KLONOPIN) 0.5 MG tablet TAKE 1/2 TO 1 TABLET BY MOUTH TWICE A DAY AS NEEDED (Patient taking differently: Take 0.25-0.5 mg by mouth 2 (two) times daily as needed. TAKE 1/2 TO 1 TABLET BY MOUTH TWICE A DAY AS NEEDED) 90 tablet 1  . cyanocobalamin (,VITAMIN B-12,) 1000 MCG/ML injection INJECT 1 ML INTO THE SKIN EVERY 30 DAYS. 3 mL 3  . ferrous sulfate 325 (65 FE) MG tablet Take 1 tablet (325 mg total) by mouth 2 (two) times daily with a meal. 30 tablet 1  . fish oil-omega-3 fatty acids 1000 MG capsule Take 2 g by mouth daily.    . fluticasone (CUTIVATE) 0.05 % cream Apply to affected area bid prn 30 g 2  . furosemide (LASIX) 40 MG tablet Take 1 tablet (40 mg total) by mouth daily. 90 tablet 1  . levothyroxine (SYNTHROID) 100 MCG tablet TAKE 1 TABLET BY MOUTH EVERY DAY BEFORE BREAKFAST 90 tablet 3  . OXYGEN Inhale into the lungs. 1Lt at bedtime    . Potassium Chloride ER 20 MEQ TBCR TAKE 1 TABLET BY MOUTH EVERY DAY 90 tablet 1  . rosuvastatin (CRESTOR) 20 MG tablet TAKE 1 TABLET BY MOUTH EVERY DAY 90 tablet 3  . sacubitril-valsartan (ENTRESTO) 24-26 MG Take 1 tablet by mouth 2 (two) times daily. 60 tablet 11  . traMADol (ULTRAM) 50 MG tablet TAKE 1 TABLET BY MOUTH TWICE A DAY AS NEEDED FOR PAIN 60 tablet 3  . Vitamin D, Ergocalciferol, (DRISDOL) 1.25 MG (50000 UNIT) CAPS capsule TAKE 1 CAPSULE (50,000 UNITS TOTAL) BY MOUTH 2 (TWO) TIMES A WEEK. 24 capsule 3   No current facility-administered medications for this encounter.    Allergies  Allergen Reactions  . Aspirin Itching  . Iodinated Diagnostic Agents Rash and Other (See Comments)    CT Contrast    Social History   Socioeconomic History  . Marital status: Married    Spouse name: Not on file  . Number of children: 8  . Years of education: Not on file  . Highest education level: Not on file  Occupational History  . Occupation: retired  Tobacco Use  .  Smoking status: Never Smoker  . Smokeless tobacco: Never Used  Vaping Use  . Vaping Use: Never used  Substance and Sexual Activity  . Alcohol use: No    Alcohol/week: 0.0 standard drinks  . Drug use: No  . Sexual activity: Not on file  Other Topics Concern  . Not on file  Social History Narrative   Widowed, has 8 children.   Orig from [place], now lives in [place].   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   No tob/alc/drugs.   Tries to walk, but her back starts to hurt.  Has to use a walker.   Social Determinants of Health   Financial Resource Strain: Not on file  Food Insecurity: Not on file  Transportation Needs: Not on file  Physical Activity: Not on file  Stress: Not on file  Social Connections: Not on file  Intimate Partner Violence: Not on file    Family History  Problem Relation Age of Onset  . Melanoma Mother   . Stomach cancer Father   . Lymphoma Sister   . Diabetes Daughter     ROS- All systems are reviewed and negative except as per the HPI above  Physical Exam: There were no vitals filed for this visit.  GEN- The patient is well appearing, alert and oriented x 3 today.   Head- normocephalic, atraumatic Eyes-  Sclera clear, conjunctiva pink Ears- hearing intact Oropharynx- clear Neck- supple, no JVP Lymph- no cervical lymphadenopathy Lungs- Clear to ausculation bilaterally, normal work of breathing Heart-  regular rate and rhythm, no murmurs, rubs or gallops, PMI not laterally displaced GI- soft, NT, ND, + BS Extremities- no clubbing, cyanosis, 2+  Edema to knee area MS- no significant deformity or atrophy Skin- no rash or lesion Psych- euthymic mood, full affect Neuro- strength and sensation are intact  EKG- Sinus brady at 57 bpm, pr int 228 ms, qrs int 138 ms, qtc 492 ms  Labs reviewed from  01/2020  Assessment and Plan: 1 Afib Maintaining SR on 200 mg amiodarone Off BB for some time now  for  bradycardia Continue apixaban for chadsvasc score of at least 4  Continue yearly PFT's  thru  pulmonology   2. Diastolic heart failure Weight  stable  Continue  lasix 40 mg qd Continue  entresto   Avoid salt   3. HTN Stable   4. Non Hodgkns B cell lymphoma S/p chemotherapy Per oncology  F/u in 6 months  Butch Penny C. Nawal Burling, [facility] [address] [place], [place] [address] [phone]

## 2020-05-03 ENCOUNTER — Other Ambulatory Visit (HOSPITAL_COMMUNITY): Payer: Self-pay | Admitting: Nurse Practitioner

## 2020-05-23 DIAGNOSIS — Z8781 Personal history of (healed) traumatic fracture: Secondary | ICD-10-CM

## 2020-05-23 HISTORY — DX: Personal history of (healed) traumatic fracture: Z87.81

## 2020-05-24 DIAGNOSIS — I4891 Unspecified atrial fibrillation: Secondary | ICD-10-CM | POA: Diagnosis not present

## 2020-05-24 DIAGNOSIS — I10 Essential (primary) hypertension: Secondary | ICD-10-CM | POA: Diagnosis not present

## 2020-05-24 DIAGNOSIS — E162 Hypoglycemia, unspecified: Secondary | ICD-10-CM | POA: Diagnosis not present

## 2020-05-24 DIAGNOSIS — M25552 Pain in left hip: Secondary | ICD-10-CM | POA: Diagnosis not present

## 2020-05-24 DIAGNOSIS — E161 Other hypoglycemia: Secondary | ICD-10-CM | POA: Diagnosis not present

## 2020-05-25 ENCOUNTER — Inpatient Hospital Stay (HOSPITAL_COMMUNITY): Payer: PPO | Admitting: Anesthesiology

## 2020-05-25 ENCOUNTER — Emergency Department (HOSPITAL_COMMUNITY): Payer: PPO

## 2020-05-25 ENCOUNTER — Inpatient Hospital Stay (HOSPITAL_COMMUNITY)
Admission: EM | Admit: 2020-05-25 | Discharge: 2020-06-03 | DRG: 481 | Disposition: A | Discharge status: Skilled Nursing Facility | Payer: PPO | Attending: Family Medicine | Admitting: Family Medicine

## 2020-05-25 ENCOUNTER — Inpatient Hospital Stay (HOSPITAL_COMMUNITY): Payer: PPO

## 2020-05-25 ENCOUNTER — Encounter (HOSPITAL_COMMUNITY)
Admission: EM | Disposition: A | Discharge status: Skilled Nursing Facility | Payer: Self-pay | Source: Home / Self Care | Attending: Internal Medicine

## 2020-05-25 ENCOUNTER — Encounter (HOSPITAL_COMMUNITY): Payer: Self-pay

## 2020-05-25 ENCOUNTER — Other Ambulatory Visit: Payer: Self-pay

## 2020-05-25 DIAGNOSIS — Z91041 Radiographic dye allergy status: Secondary | ICD-10-CM

## 2020-05-25 DIAGNOSIS — Z9181 History of falling: Secondary | ICD-10-CM | POA: Diagnosis not present

## 2020-05-25 DIAGNOSIS — E039 Hypothyroidism, unspecified: Secondary | ICD-10-CM | POA: Diagnosis not present

## 2020-05-25 DIAGNOSIS — Z419 Encounter for procedure for purposes other than remedying health state, unspecified: Secondary | ICD-10-CM

## 2020-05-25 DIAGNOSIS — S72009A Fracture of unspecified part of neck of unspecified femur, initial encounter for closed fracture: Secondary | ICD-10-CM

## 2020-05-25 DIAGNOSIS — Z79890 Hormone replacement therapy (postmenopausal): Secondary | ICD-10-CM

## 2020-05-25 DIAGNOSIS — G319 Degenerative disease of nervous system, unspecified: Secondary | ICD-10-CM | POA: Diagnosis not present

## 2020-05-25 DIAGNOSIS — I5022 Chronic systolic (congestive) heart failure: Secondary | ICD-10-CM | POA: Diagnosis present

## 2020-05-25 DIAGNOSIS — D62 Acute posthemorrhagic anemia: Secondary | ICD-10-CM | POA: Diagnosis not present

## 2020-05-25 DIAGNOSIS — D509 Iron deficiency anemia, unspecified: Secondary | ICD-10-CM | POA: Diagnosis present

## 2020-05-25 DIAGNOSIS — E785 Hyperlipidemia, unspecified: Secondary | ICD-10-CM | POA: Diagnosis present

## 2020-05-25 DIAGNOSIS — Z800 Family history of malignant neoplasm of digestive organs: Secondary | ICD-10-CM

## 2020-05-25 DIAGNOSIS — Z20822 Contact with and (suspected) exposure to covid-19: Secondary | ICD-10-CM | POA: Diagnosis present

## 2020-05-25 DIAGNOSIS — R2689 Other abnormalities of gait and mobility: Secondary | ICD-10-CM | POA: Diagnosis not present

## 2020-05-25 DIAGNOSIS — M6281 Muscle weakness (generalized): Secondary | ICD-10-CM | POA: Diagnosis not present

## 2020-05-25 DIAGNOSIS — Z6833 Body mass index (BMI) 33.0-33.9, adult: Secondary | ICD-10-CM | POA: Diagnosis not present

## 2020-05-25 DIAGNOSIS — I130 Hypertensive heart and chronic kidney disease with heart failure and stage 1 through stage 4 chronic kidney disease, or unspecified chronic kidney disease: Secondary | ICD-10-CM | POA: Diagnosis not present

## 2020-05-25 DIAGNOSIS — D696 Thrombocytopenia, unspecified: Secondary | ICD-10-CM | POA: Diagnosis not present

## 2020-05-25 DIAGNOSIS — N179 Acute kidney failure, unspecified: Secondary | ICD-10-CM | POA: Diagnosis present

## 2020-05-25 DIAGNOSIS — W1830XA Fall on same level, unspecified, initial encounter: Secondary | ICD-10-CM | POA: Diagnosis present

## 2020-05-25 DIAGNOSIS — S72145A Nondisplaced intertrochanteric fracture of left femur, initial encounter for closed fracture: Secondary | ICD-10-CM | POA: Diagnosis not present

## 2020-05-25 DIAGNOSIS — I2510 Atherosclerotic heart disease of native coronary artery without angina pectoris: Secondary | ICD-10-CM | POA: Diagnosis present

## 2020-05-25 DIAGNOSIS — R41841 Cognitive communication deficit: Secondary | ICD-10-CM | POA: Diagnosis not present

## 2020-05-25 DIAGNOSIS — T8189XA Other complications of procedures, not elsewhere classified, initial encounter: Secondary | ICD-10-CM | POA: Diagnosis not present

## 2020-05-25 DIAGNOSIS — M2550 Pain in unspecified joint: Secondary | ICD-10-CM | POA: Diagnosis not present

## 2020-05-25 DIAGNOSIS — Z043 Encounter for examination and observation following other accident: Secondary | ICD-10-CM | POA: Diagnosis not present

## 2020-05-25 DIAGNOSIS — I959 Hypotension, unspecified: Secondary | ICD-10-CM | POA: Diagnosis not present

## 2020-05-25 DIAGNOSIS — I10 Essential (primary) hypertension: Secondary | ICD-10-CM | POA: Diagnosis present

## 2020-05-25 DIAGNOSIS — R52 Pain, unspecified: Secondary | ICD-10-CM

## 2020-05-25 DIAGNOSIS — F419 Anxiety disorder, unspecified: Secondary | ICD-10-CM | POA: Diagnosis present

## 2020-05-25 DIAGNOSIS — Z7901 Long term (current) use of anticoagulants: Secondary | ICD-10-CM

## 2020-05-25 DIAGNOSIS — Z807 Family history of other malignant neoplasms of lymphoid, hematopoietic and related tissues: Secondary | ICD-10-CM

## 2020-05-25 DIAGNOSIS — Z886 Allergy status to analgesic agent status: Secondary | ICD-10-CM

## 2020-05-25 DIAGNOSIS — Z7401 Bed confinement status: Secondary | ICD-10-CM | POA: Diagnosis not present

## 2020-05-25 DIAGNOSIS — Z9221 Personal history of antineoplastic chemotherapy: Secondary | ICD-10-CM | POA: Diagnosis not present

## 2020-05-25 DIAGNOSIS — Z833 Family history of diabetes mellitus: Secondary | ICD-10-CM

## 2020-05-25 DIAGNOSIS — K649 Unspecified hemorrhoids: Secondary | ICD-10-CM | POA: Diagnosis present

## 2020-05-25 DIAGNOSIS — I480 Paroxysmal atrial fibrillation: Secondary | ICD-10-CM | POA: Diagnosis present

## 2020-05-25 DIAGNOSIS — I447 Left bundle-branch block, unspecified: Secondary | ICD-10-CM | POA: Diagnosis not present

## 2020-05-25 DIAGNOSIS — S72102A Unspecified trochanteric fracture of left femur, initial encounter for closed fracture: Secondary | ICD-10-CM | POA: Diagnosis not present

## 2020-05-25 DIAGNOSIS — Y92009 Unspecified place in unspecified non-institutional (private) residence as the place of occurrence of the external cause: Secondary | ICD-10-CM | POA: Diagnosis not present

## 2020-05-25 DIAGNOSIS — J449 Chronic obstructive pulmonary disease, unspecified: Secondary | ICD-10-CM | POA: Diagnosis not present

## 2020-05-25 DIAGNOSIS — N1831 Chronic kidney disease, stage 3a: Secondary | ICD-10-CM | POA: Diagnosis not present

## 2020-05-25 DIAGNOSIS — D631 Anemia in chronic kidney disease: Secondary | ICD-10-CM | POA: Diagnosis not present

## 2020-05-25 DIAGNOSIS — M50323 Other cervical disc degeneration at C6-C7 level: Secondary | ICD-10-CM | POA: Diagnosis not present

## 2020-05-25 DIAGNOSIS — Z79899 Other long term (current) drug therapy: Secondary | ICD-10-CM

## 2020-05-25 DIAGNOSIS — S72002A Fracture of unspecified part of neck of left femur, initial encounter for closed fracture: Secondary | ICD-10-CM

## 2020-05-25 DIAGNOSIS — S72142A Displaced intertrochanteric fracture of left femur, initial encounter for closed fracture: Principal | ICD-10-CM | POA: Diagnosis present

## 2020-05-25 DIAGNOSIS — S79929A Unspecified injury of unspecified thigh, initial encounter: Secondary | ICD-10-CM | POA: Diagnosis not present

## 2020-05-25 DIAGNOSIS — Z808 Family history of malignant neoplasm of other organs or systems: Secondary | ICD-10-CM

## 2020-05-25 DIAGNOSIS — I110 Hypertensive heart disease with heart failure: Secondary | ICD-10-CM | POA: Diagnosis not present

## 2020-05-25 DIAGNOSIS — M25551 Pain in right hip: Secondary | ICD-10-CM | POA: Diagnosis present

## 2020-05-25 DIAGNOSIS — E669 Obesity, unspecified: Secondary | ICD-10-CM | POA: Diagnosis present

## 2020-05-25 DIAGNOSIS — R531 Weakness: Secondary | ICD-10-CM | POA: Diagnosis not present

## 2020-05-25 DIAGNOSIS — Z01818 Encounter for other preprocedural examination: Secondary | ICD-10-CM | POA: Diagnosis not present

## 2020-05-25 DIAGNOSIS — Z9981 Dependence on supplemental oxygen: Secondary | ICD-10-CM

## 2020-05-25 DIAGNOSIS — Z8572 Personal history of non-Hodgkin lymphomas: Secondary | ICD-10-CM | POA: Diagnosis not present

## 2020-05-25 DIAGNOSIS — I517 Cardiomegaly: Secondary | ICD-10-CM | POA: Diagnosis not present

## 2020-05-25 DIAGNOSIS — J984 Other disorders of lung: Secondary | ICD-10-CM | POA: Diagnosis not present

## 2020-05-25 DIAGNOSIS — I5032 Chronic diastolic (congestive) heart failure: Secondary | ICD-10-CM | POA: Diagnosis not present

## 2020-05-25 DIAGNOSIS — M25552 Pain in left hip: Secondary | ICD-10-CM | POA: Diagnosis not present

## 2020-05-25 DIAGNOSIS — S72002D Fracture of unspecified part of neck of left femur, subsequent encounter for closed fracture with routine healing: Secondary | ICD-10-CM | POA: Diagnosis not present

## 2020-05-25 DIAGNOSIS — I5042 Chronic combined systolic (congestive) and diastolic (congestive) heart failure: Secondary | ICD-10-CM | POA: Diagnosis not present

## 2020-05-25 HISTORY — PX: INTRAMEDULLARY (IM) NAIL INTERTROCHANTERIC: SHX5875

## 2020-05-25 LAB — CBC WITH DIFFERENTIAL/PLATELET
Abs Immature Granulocytes: 0.03 10*3/uL (ref 0.00–0.07)
Basophils Absolute: 0 10*3/uL (ref 0.0–0.1)
Basophils Relative: 0 %
Eosinophils Absolute: 0 10*3/uL (ref 0.0–0.5)
Eosinophils Relative: 0 %
HCT: 41.2 % (ref 36.0–46.0)
Hemoglobin: 13.3 g/dL (ref 12.0–15.0)
Immature Granulocytes: 0 %
Lymphocytes Relative: 11 %
Lymphs Abs: 0.7 10*3/uL (ref 0.7–4.0)
MCH: 31.3 pg (ref 26.0–34.0)
MCHC: 32.3 g/dL (ref 30.0–36.0)
MCV: 96.9 fL (ref 80.0–100.0)
Monocytes Absolute: 0.4 10*3/uL (ref 0.1–1.0)
Monocytes Relative: 6 %
Neutro Abs: 5.5 10*3/uL (ref 1.7–7.7)
Neutrophils Relative %: 83 %
Platelets: 121 10*3/uL — ABNORMAL LOW (ref 150–400)
RBC: 4.25 MIL/uL (ref 3.87–5.11)
RDW: 12.4 % (ref 11.5–15.5)
WBC: 6.7 10*3/uL (ref 4.0–10.5)
nRBC: 0 % (ref 0.0–0.2)

## 2020-05-25 LAB — I-STAT CHEM 8, ED
BUN: 22 mg/dL (ref 8–23)
Calcium, Ion: 1.17 mmol/L (ref 1.15–1.40)
Chloride: 103 mmol/L (ref 98–111)
Creatinine, Ser: 0.9 mg/dL (ref 0.44–1.00)
Glucose, Bld: 180 mg/dL — ABNORMAL HIGH (ref 70–99)
HCT: 36 % (ref 36.0–46.0)
Hemoglobin: 12.2 g/dL (ref 12.0–15.0)
Potassium: 4.3 mmol/L (ref 3.5–5.1)
Sodium: 141 mmol/L (ref 135–145)
TCO2: 29 mmol/L (ref 22–32)

## 2020-05-25 LAB — BASIC METABOLIC PANEL
Anion gap: 10 (ref 5–15)
BUN: 19 mg/dL (ref 8–23)
CO2: 28 mmol/L (ref 22–32)
Calcium: 9.1 mg/dL (ref 8.9–10.3)
Chloride: 102 mmol/L (ref 98–111)
Creatinine, Ser: 1.05 mg/dL — ABNORMAL HIGH (ref 0.44–1.00)
GFR, Estimated: 52 mL/min — ABNORMAL LOW (ref >60)
Glucose, Bld: 178 mg/dL — ABNORMAL HIGH (ref 70–99)
Potassium: 3.9 mmol/L (ref 3.5–5.1)
Sodium: 140 mmol/L (ref 135–145)

## 2020-05-25 LAB — RESP PANEL BY RT-PCR (FLU A&B, COVID) ARPGX2
Influenza A by PCR: NEGATIVE
Influenza B by PCR: NEGATIVE
SARS Coronavirus 2 by RT PCR: NEGATIVE

## 2020-05-25 LAB — SURGICAL PCR SCREEN
MRSA, PCR: NEGATIVE
Staphylococcus aureus: NEGATIVE

## 2020-05-25 SURGERY — FIXATION, FRACTURE, INTERTROCHANTERIC, WITH INTRAMEDULLARY ROD
Anesthesia: General | Laterality: Left

## 2020-05-25 MED ORDER — LIDOCAINE 2% (20 MG/ML) 5 ML SYRINGE
INTRAMUSCULAR | Status: AC
  Filled 2020-05-25: qty 5

## 2020-05-25 MED ORDER — FENTANYL CITRATE (PF) 100 MCG/2ML IJ SOLN
INTRAMUSCULAR | Status: DC | PRN
  Administered 2020-05-25: 50 ug via INTRAVENOUS

## 2020-05-25 MED ORDER — DOCUSATE SODIUM 100 MG PO CAPS
100.0000 mg | ORAL_CAPSULE | Freq: Two times a day (BID) | ORAL | Status: DC
  Administered 2020-05-25 – 2020-06-03 (×17): 100 mg via ORAL
  Filled 2020-05-25 (×17): qty 1

## 2020-05-25 MED ORDER — CEFAZOLIN SODIUM-DEXTROSE 2-4 GM/100ML-% IV SOLN
2.0000 g | INTRAVENOUS | Status: AC
  Administered 2020-05-25: 2 g via INTRAVENOUS
  Filled 2020-05-25: qty 100

## 2020-05-25 MED ORDER — TRANEXAMIC ACID-NACL 1000-0.7 MG/100ML-% IV SOLN
1000.0000 mg | Freq: Once | INTRAVENOUS | Status: AC
  Administered 2020-05-25: 1000 mg via INTRAVENOUS
  Filled 2020-05-25: qty 100

## 2020-05-25 MED ORDER — MORPHINE SULFATE (PF) 2 MG/ML IV SOLN: 0.5000 mg | INTRAVENOUS | Status: DC | PRN

## 2020-05-25 MED ORDER — NALOXONE HCL 0.4 MG/ML IJ SOLN: 0.4000 mg | INTRAMUSCULAR | Status: DC | PRN

## 2020-05-25 MED ORDER — ARTIFICIAL TEARS OPHTHALMIC OINT
TOPICAL_OINTMENT | OPHTHALMIC | Status: AC
  Filled 2020-05-25: qty 3.5

## 2020-05-25 MED ORDER — METOCLOPRAMIDE HCL 5 MG/ML IJ SOLN: 5.0000 mg | Freq: Three times a day (TID) | INTRAMUSCULAR | Status: DC | PRN

## 2020-05-25 MED ORDER — ONDANSETRON HCL 4 MG PO TABS: 4.0000 mg | ORAL_TABLET | Freq: Four times a day (QID) | ORAL | Status: DC | PRN

## 2020-05-25 MED ORDER — HYDROMORPHONE HCL 1 MG/ML IJ SOLN
0.5000 mg | INTRAMUSCULAR | Status: DC | PRN
  Administered 2020-05-25 (×4): 1 mg via INTRAVENOUS
  Filled 2020-05-25 (×4): qty 1

## 2020-05-25 MED ORDER — FENTANYL CITRATE (PF) 250 MCG/5ML IJ SOLN
INTRAMUSCULAR | Status: AC
  Filled 2020-05-25: qty 5

## 2020-05-25 MED ORDER — ACETAMINOPHEN 500 MG PO TABS
1000.0000 mg | ORAL_TABLET | Freq: Four times a day (QID) | ORAL | Status: DC | PRN
  Administered 2020-05-26 – 2020-06-03 (×4): 1000 mg via ORAL
  Filled 2020-05-25 (×4): qty 2

## 2020-05-25 MED ORDER — ONDANSETRON HCL 4 MG/2ML IJ SOLN: 4.0000 mg | Freq: Four times a day (QID) | INTRAMUSCULAR | Status: DC | PRN

## 2020-05-25 MED ORDER — ORAL CARE MOUTH RINSE: 15.0000 mL | Freq: Once | OROMUCOSAL | Status: AC

## 2020-05-25 MED ORDER — ROCURONIUM BROMIDE 10 MG/ML (PF) SYRINGE
PREFILLED_SYRINGE | INTRAVENOUS | Status: DC | PRN
  Administered 2020-05-25: 50 mg via INTRAVENOUS

## 2020-05-25 MED ORDER — POVIDONE-IODINE 10 % EX SWAB
2.0000 "application " | Freq: Once | CUTANEOUS | Status: AC
  Administered 2020-05-25: 2 via TOPICAL

## 2020-05-25 MED ORDER — SUCCINYLCHOLINE CHLORIDE 200 MG/10ML IV SOSY
PREFILLED_SYRINGE | INTRAVENOUS | Status: DC | PRN
  Administered 2020-05-25: 140 mg via INTRAVENOUS

## 2020-05-25 MED ORDER — PHENYLEPHRINE HCL-NACL 10-0.9 MG/250ML-% IV SOLN
INTRAVENOUS | Status: DC | PRN
  Administered 2020-05-25: 50 ug/min via INTRAVENOUS

## 2020-05-25 MED ORDER — ROCURONIUM BROMIDE 10 MG/ML (PF) SYRINGE
PREFILLED_SYRINGE | INTRAVENOUS | Status: AC
  Filled 2020-05-25: qty 10

## 2020-05-25 MED ORDER — METOCLOPRAMIDE HCL 5 MG PO TABS: 5.0000 mg | ORAL_TABLET | Freq: Three times a day (TID) | ORAL | Status: DC | PRN

## 2020-05-25 MED ORDER — LEVOTHYROXINE SODIUM 100 MCG PO TABS
100.0000 ug | ORAL_TABLET | Freq: Every day | ORAL | Status: DC
  Administered 2020-05-25 – 2020-06-03 (×10): 100 ug via ORAL
  Filled 2020-05-25 (×10): qty 1

## 2020-05-25 MED ORDER — CHLORHEXIDINE GLUCONATE 0.12 % MT SOLN
OROMUCOSAL | Status: AC
  Administered 2020-05-25: 15 mL via OROMUCOSAL
  Filled 2020-05-25: qty 15

## 2020-05-25 MED ORDER — PHENYLEPHRINE HCL (PRESSORS) 10 MG/ML IV SOLN
INTRAVENOUS | Status: DC | PRN
  Administered 2020-05-25: 40 ug via INTRAVENOUS

## 2020-05-25 MED ORDER — CHLORHEXIDINE GLUCONATE 4 % EX LIQD: 60.0000 mL | Freq: Once | CUTANEOUS | Status: DC

## 2020-05-25 MED ORDER — ROSUVASTATIN CALCIUM 20 MG PO TABS
20.0000 mg | ORAL_TABLET | Freq: Every day | ORAL | Status: DC
  Administered 2020-05-26 – 2020-06-03 (×9): 20 mg via ORAL
  Filled 2020-05-25 (×12): qty 1

## 2020-05-25 MED ORDER — FENTANYL CITRATE (PF) 100 MCG/2ML IJ SOLN
INTRAMUSCULAR | Status: AC
  Administered 2020-05-25: 25 ug via INTRAVENOUS
  Filled 2020-05-25: qty 2

## 2020-05-25 MED ORDER — SODIUM CHLORIDE 0.9 % IV SOLN
1.0000 g | Freq: Four times a day (QID) | INTRAVENOUS | Status: AC
  Administered 2020-05-25 – 2020-05-26 (×3): 1 g via INTRAVENOUS
  Filled 2020-05-25 (×3): qty 10

## 2020-05-25 MED ORDER — FENTANYL CITRATE (PF) 100 MCG/2ML IJ SOLN
25.0000 ug | INTRAMUSCULAR | Status: DC | PRN
  Administered 2020-05-26: 25 ug via INTRAVENOUS
  Filled 2020-05-25: qty 2

## 2020-05-25 MED ORDER — 0.9 % SODIUM CHLORIDE (POUR BTL) OPTIME
TOPICAL | Status: DC | PRN
  Administered 2020-05-25: 1000 mL

## 2020-05-25 MED ORDER — SACUBITRIL-VALSARTAN 24-26 MG PO TABS
1.0000 | ORAL_TABLET | Freq: Two times a day (BID) | ORAL | Status: DC
  Administered 2020-05-25 – 2020-06-03 (×18): 1 via ORAL
  Filled 2020-05-25 (×20): qty 1

## 2020-05-25 MED ORDER — ONDANSETRON HCL 4 MG/2ML IJ SOLN
INTRAMUSCULAR | Status: DC | PRN
  Administered 2020-05-25: 4 mg via INTRAVENOUS

## 2020-05-25 MED ORDER — FENTANYL CITRATE (PF) 100 MCG/2ML IJ SOLN
50.0000 ug | Freq: Once | INTRAMUSCULAR | Status: AC
  Administered 2020-05-25: 50 ug via INTRAVENOUS
  Filled 2020-05-25: qty 2

## 2020-05-25 MED ORDER — ONDANSETRON HCL 4 MG/2ML IJ SOLN
INTRAMUSCULAR | Status: AC
  Filled 2020-05-25: qty 2

## 2020-05-25 MED ORDER — FERROUS SULFATE 325 (65 FE) MG PO TABS
325.0000 mg | ORAL_TABLET | Freq: Two times a day (BID) | ORAL | Status: DC
  Administered 2020-05-25 – 2020-06-03 (×19): 325 mg via ORAL
  Filled 2020-05-25 (×19): qty 1

## 2020-05-25 MED ORDER — LACTATED RINGERS IV SOLN: INTRAVENOUS | Status: DC

## 2020-05-25 MED ORDER — SODIUM CHLORIDE 0.9 % IV BOLUS
500.0000 mL | Freq: Once | INTRAVENOUS | Status: AC | PRN
  Administered 2020-05-25: 500 mL via INTRAVENOUS

## 2020-05-25 MED ORDER — POTASSIUM CHLORIDE CRYS ER 20 MEQ PO TBCR
20.0000 meq | EXTENDED_RELEASE_TABLET | Freq: Every day | ORAL | Status: DC
  Administered 2020-05-26 – 2020-06-03 (×9): 20 meq via ORAL
  Filled 2020-05-25 (×9): qty 1

## 2020-05-25 MED ORDER — HYDROCODONE-ACETAMINOPHEN 5-325 MG PO TABS
1.0000 | ORAL_TABLET | Freq: Four times a day (QID) | ORAL | Status: DC | PRN
Start: 2020-05-25 — End: 2020-05-25

## 2020-05-25 MED ORDER — PROPOFOL 10 MG/ML IV BOLUS
INTRAVENOUS | Status: AC
  Filled 2020-05-25: qty 40

## 2020-05-25 MED ORDER — FENTANYL CITRATE (PF) 100 MCG/2ML IJ SOLN
25.0000 ug | Freq: Once | INTRAMUSCULAR | Status: AC
Start: 2020-05-25 — End: 2020-05-25

## 2020-05-25 MED ORDER — ALBUMIN HUMAN 5 % IV SOLN: INTRAVENOUS | Status: DC | PRN

## 2020-05-25 MED ORDER — CLONAZEPAM 0.25 MG PO TBDP
0.2500 mg | ORAL_TABLET | Freq: Two times a day (BID) | ORAL | Status: DC | PRN
  Administered 2020-05-30: 0.5 mg via ORAL
  Administered 2020-05-31: 0.25 mg via ORAL
  Filled 2020-05-25: qty 2
  Filled 2020-05-25: qty 1

## 2020-05-25 MED ORDER — FENTANYL CITRATE (PF) 100 MCG/2ML IJ SOLN
50.0000 ug | INTRAMUSCULAR | Status: DC | PRN
  Administered 2020-05-26: 50 ug via INTRAVENOUS
  Filled 2020-05-25: qty 2

## 2020-05-25 MED ORDER — SUCCINYLCHOLINE CHLORIDE 200 MG/10ML IV SOSY
PREFILLED_SYRINGE | INTRAVENOUS | Status: AC
  Filled 2020-05-25: qty 10

## 2020-05-25 MED ORDER — PROPOFOL 500 MG/50ML IV EMUL
INTRAVENOUS | Status: DC | PRN
  Administered 2020-05-25: 75 ug/kg/min via INTRAVENOUS

## 2020-05-25 MED ORDER — PROPOFOL 10 MG/ML IV BOLUS
INTRAVENOUS | Status: DC | PRN
  Administered 2020-05-25: 120 mg via INTRAVENOUS

## 2020-05-25 MED ORDER — AMLODIPINE BESYLATE 2.5 MG PO TABS
2.5000 mg | ORAL_TABLET | Freq: Every day | ORAL | Status: DC
  Filled 2020-05-25: qty 1

## 2020-05-25 MED ORDER — CHLORHEXIDINE GLUCONATE 0.12 % MT SOLN: 15.0000 mL | Freq: Once | OROMUCOSAL | Status: AC

## 2020-05-25 MED ORDER — PHENYLEPHRINE 40 MCG/ML (10ML) SYRINGE FOR IV PUSH (FOR BLOOD PRESSURE SUPPORT)
PREFILLED_SYRINGE | INTRAVENOUS | Status: AC
  Filled 2020-05-25: qty 10

## 2020-05-25 MED ORDER — FUROSEMIDE 40 MG PO TABS
40.0000 mg | ORAL_TABLET | Freq: Every day | ORAL | Status: DC
  Administered 2020-05-26: 40 mg via ORAL
  Filled 2020-05-25: qty 1

## 2020-05-25 MED ORDER — SUGAMMADEX SODIUM 200 MG/2ML IV SOLN
INTRAVENOUS | Status: DC | PRN
  Administered 2020-05-25: 200 mg via INTRAVENOUS

## 2020-05-25 MED ORDER — AMIODARONE HCL 200 MG PO TABS
200.0000 mg | ORAL_TABLET | Freq: Every day | ORAL | Status: DC
  Administered 2020-05-26 – 2020-06-03 (×9): 200 mg via ORAL
  Filled 2020-05-25 (×9): qty 1

## 2020-05-25 MED ORDER — LIDOCAINE 2% (20 MG/ML) 5 ML SYRINGE
INTRAMUSCULAR | Status: DC | PRN
  Administered 2020-05-25: 80 mg via INTRAVENOUS

## 2020-05-25 SURGICAL SUPPLY — 56 items
APL PRP STRL LF DISP 70% ISPRP (MISCELLANEOUS) ×1
BIT DRILL CANN LG 4.3MM (BIT) IMPLANT
BLADE SURG 10 STRL SS (BLADE) ×4 IMPLANT
BNDG COHESIVE 4X5 TAN STRL (GAUZE/BANDAGES/DRESSINGS) IMPLANT
BNDG COHESIVE 6X5 TAN STRL LF (GAUZE/BANDAGES/DRESSINGS) ×4 IMPLANT
BNDG ELASTIC 4X5.8 VLCR STR LF (GAUZE/BANDAGES/DRESSINGS) ×2 IMPLANT
BNDG ELASTIC 6X5.8 VLCR STR LF (GAUZE/BANDAGES/DRESSINGS) ×2 IMPLANT
BRUSH SCRUB EZ PLAIN DRY (MISCELLANEOUS) ×4 IMPLANT
CHLORAPREP W/TINT 26 (MISCELLANEOUS) ×2 IMPLANT
COVER MAYO STAND STRL (DRAPES) ×2 IMPLANT
COVER SURGICAL LIGHT HANDLE (MISCELLANEOUS) ×2 IMPLANT
COVER WAND RF STERILE (DRAPES) ×2 IMPLANT
DRAPE C-ARM 35X43 STRL (DRAPES) ×2 IMPLANT
DRAPE C-ARMOR (DRAPES) ×2 IMPLANT
DRAPE HALF SHEET 40X57 (DRAPES) ×4 IMPLANT
DRAPE IMP U-DRAPE 54X76 (DRAPES) ×4 IMPLANT
DRAPE INCISE IOBAN 66X45 STRL (DRAPES) ×2 IMPLANT
DRAPE ORTHO SPLIT 77X108 STRL (DRAPES) ×4
DRAPE SURG 17X23 STRL (DRAPES) ×2 IMPLANT
DRAPE SURG ORHT 6 SPLT 77X108 (DRAPES) ×2 IMPLANT
DRAPE U-SHAPE 47X51 STRL (DRAPES) ×2 IMPLANT
DRILL BIT CANN LG 4.3MM (BIT) ×2
DRSG MEPILEX BORDER 4X4 (GAUZE/BANDAGES/DRESSINGS) ×6 IMPLANT
DRSG MEPILEX BORDER 4X8 (GAUZE/BANDAGES/DRESSINGS) ×2 IMPLANT
ELECT REM PT RETURN 9FT ADLT (ELECTROSURGICAL) ×2
ELECTRODE REM PT RTRN 9FT ADLT (ELECTROSURGICAL) ×1 IMPLANT
GLOVE BIOGEL M STRL SZ7.5 (GLOVE) ×2 IMPLANT
GLOVE SRG 8 PF TXTR STRL LF DI (GLOVE) ×1 IMPLANT
GLOVE SURG UNDER POLY LF SZ8 (GLOVE) ×2
GOWN STRL REUS W/ TWL LRG LVL3 (GOWN DISPOSABLE) ×1 IMPLANT
GOWN STRL REUS W/ TWL XL LVL3 (GOWN DISPOSABLE) ×1 IMPLANT
GOWN STRL REUS W/TWL LRG LVL3 (GOWN DISPOSABLE) ×2
GOWN STRL REUS W/TWL XL LVL3 (GOWN DISPOSABLE) ×2
GUIDEPIN 3.2X17.5 THRD DISP (PIN) ×3 IMPLANT
GUIDEWIRE BALL NOSE 100CM (WIRE) ×1 IMPLANT
HIP FRAC NAIL LAG SCR 10.5X100 (Orthopedic Implant) ×2 IMPLANT
KIT BASIN OR (CUSTOM PROCEDURE TRAY) ×2 IMPLANT
KIT TURNOVER KIT B (KITS) ×2 IMPLANT
MANIFOLD NEPTUNE II (INSTRUMENTS) ×2 IMPLANT
NAIL HIP FRACT 130D 11X180 (Screw) ×1 IMPLANT
NS IRRIG 1000ML POUR BTL (IV SOLUTION) ×2 IMPLANT
PACK GENERAL/GYN (CUSTOM PROCEDURE TRAY) ×2 IMPLANT
PAD ARMBOARD 7.5X6 YLW CONV (MISCELLANEOUS) ×4 IMPLANT
SCREW ANTI ROTATION 85MM (Screw) ×1 IMPLANT
SCREW BONE CORTICAL 5.0X38 (Screw) ×1 IMPLANT
SCREW CANN THRD AFF 10.5X100 (Orthopedic Implant) IMPLANT
SCREW DRILL BIT ANIT ROTATION (BIT) ×1 IMPLANT
STAPLER VISISTAT 35W (STAPLE) ×2 IMPLANT
STOCKINETTE IMPERVIOUS LG (DRAPES) ×2 IMPLANT
SUT MON AB 3-0 SH 27 (SUTURE) ×2
SUT MON AB 3-0 SH27 (SUTURE) ×1 IMPLANT
SUT PDS AB 2-0 CT1 27 (SUTURE) ×2 IMPLANT
TOWEL GREEN STERILE (TOWEL DISPOSABLE) ×4 IMPLANT
TOWEL GREEN STERILE FF (TOWEL DISPOSABLE) ×2 IMPLANT
UNDERPAD 30X36 HEAVY ABSORB (UNDERPADS AND DIAPERS) ×2 IMPLANT
WATER STERILE IRR 1000ML POUR (IV SOLUTION) ×2 IMPLANT

## 2020-05-25 NOTE — Progress Notes (Signed)
Amanda Sellers for Restarting DOAC on POD #1 if AM Hgb Stable (Apixaban) Indication: atrial fibrillation  Allergies  Allergen Reactions  . Aspirin Itching  . Iodinated Diagnostic Agents Rash and Other (See Comments)    CT Contrast    Patient Measurements: Height: 5\' 6"  (167.6 cm) Weight: 93.4 kg (205 lb 14.6 oz) IBW/kg (Calculated) : 59.3  Vital Signs: Temp: 97.9 F (36.6 C) (05/03 0910) Temp Source: Oral (05/03 0910) BP: 177/66 (05/03 0910) Pulse Rate: 77 (05/03 0910)  Labs: Recent Labs    05/25/20 0028 05/25/20 0154 05/25/20 0200  HGB 13.3  --  12.2  HCT 41.2  --  36.0  PLT 121*  --   --   CREATININE  --  1.05* 0.90    Estimated Creatinine Clearance: 52.6 mL/min (by C-G formula based on SCr of 0.9 mg/dL).   Medical History: Past Medical History:  Diagnosis Date  . Anemia of chronic disease   . Anticoagulation adequate, Eliquis 10/16/2014  . Anxiety    clonaz helps this AND her breathing  . Chronic combined systolic and diastolic congestive heart failure ([place]) 11/08/5100   Complicated by atrial fibrillation; Echo 07/23/14: mild LVH, EF 50-55%, Gr 2 DD.;  Echo May 15, 2019: EF 35 to 40%, GRII DD.  (In setting of CHF exacerbation, class III)-> baseline class II symptoms. 09/2019 EF 55-60%, nl wall motion and LV fxn, grd II DD, mod MR.  . Chronic renal insufficiency, stage III (moderate) (HCC)    GFR @ 50  . COPD (chronic obstructive pulmonary disease) ([place])    recently noted on Xray, does not have any problems  . Follicular lymphoma ([place])    Non Hodgkins B cell lymphoma; s/p chemo summer 2016--in remission still as of 09/2018: continue obs q 38mo  . HLD (hyperlipidemia)     06/2014  . HTN (hypertension)   . Hypothyroidism   . IFG (impaired fasting glucose)   . Lip cancer    Dr. Janace Hoard excised this: invasive SCC--no sign of cancer at ENT f/u 10/2015  . Microcytic anemia    transfused 3 U total in hosp 07/2014  . Obesity (BMI  30-39.9)   . Olecranon bursitis of right elbow 02/2015  . Pancytopenia due to antineoplastic chemotherapy ([place])   . Persistent atrial fibrillation (HCC)    With RVR; elec cardioversion 11/30/14.  On Amio since 10/2014; Dr. Lenna Gilford following her from pulm standpoint regarding this med.  Marland Kitchen Positive occult stool blood test 08/08/14   Endoscopies ok 08/2014  . Primary osteoarthritis of both knees 04/2016   Dr. Gladstone Lighter: bone on bone--to get euflexxa to both knees.  . Pulmonary hypertension due to left heart disease ([place])    RLCP 05/19/2019: EF 35-40%. D1 50%, RI 70%, dLM-LAD 30%.  mPAP 43 mmHg - (62/19 mmHg), PCWP  28 mmHg (V wave 41 mmHg) LVEDP 34 mmHg  . Pulmonary metastases ([place]) 07/24/2014  . Vitamin B 12 deficiency   . Vitamin D deficiency    dose increased to 50 K U tab TWICE weekly 04/2017    Assessment: 87 yr old female with hx of atrial fibrillation (for which she was taking apixaban 5 mg PO BID PTA) was admitted on 05/24/20 with a L hip fx following a fall at home. Pt is S/P orthopedic surgery this afternoon. Pharmacy is consulted to resume home DOAC on POD #1 if AM Hgb stable (=/> 8) and pt not requiring blood transfusions.  H/H 12.2/36.0, plt 121  Goal of Therapy:  Prevention of stroke secondary to atrial fibrillation Monitor platelets by anticoagulation protocol: Yes   Plan:  Resume apixaban 5 mg po BID on POD #1 (05/26/20) if AM Hgb =/>8 and pt requiring no blood transfusions Monitor CBC Monitor for bleeding  Gillermina Hu, PharmD, BCPS, [facility] Clinical Pharmacist 05/25/2020,4:15 PM

## 2020-05-25 NOTE — Progress Notes (Signed)
PROGRESS NOTE  Amanda Sellers  DOB: [birth year]  PCP: Tammi Sou, MD DPO:[id number]  DOA: 05/25/2020  LOS: 0 days   Chief Complaint  Patient presents with  . Fall   Brief narrative: Amanda Sellers is a 87 y.o. female with PMH significant for A.Fib on Eliquis, dCHF, HTN. Patient was brought to the ED from home on 05/24/2020 for fall from losing her balance while she was getting ready for bed.  She had severe pain to the left hip following fall.  In the ED, afebrile, blood pressure elevated up to 200/105 Hip x-ray showed medially angulated reverse oblique intertrochanteric fracture of the proximal left femur. Skeletal survey negative otherwise ED physician discussed the case with orthopedist Dr. Lucia Gaskins.  Patient was made NPO.  Eliquis was held. Patient was admitted to hospitalist service.  Subjective: Patient was seen and examined this morning.  Pleasant elderly Caucasian female.  Lying down in bed.  Not in distress.  On low-flow oxygen.  Daughter at bedside. Remains afebrile, blood pressure improving, 135/65 this morning Labs unremarkable  Assessment/Plan: Intertrochanteric left femur fracture -After fall from losing her balance -Orthopedics Dr. Lucia Gaskins consulted from ED. -Noted a plan by orthopedics for surgical fixation today -Eliquis on hold.  Last dose per patient was at 4:30 PM on 5/2  History of systolic CHF  Essential hypertension  -Home meds include amlodipine 2.5 mg daily, Lasix 40 mg daily, Entresto 24/26 mg twice daily -Echo from 04/2019 had shown a low EF of 35 to 40% which improved to 55 to 60% on repeat echo on 09/2019. -Currently blood pressure is stable.  Continue all 3 Entresto, Lasix and amlodipine.  Paroxysmal A. fib Cont amiodarone 200 mg daily Hold eliquis  Hyperlipidemia -Continue Crestor 20 mg daily  Hypothyroidism -Continue Synthroid 100 mcg daily  Anxiety -Klonopin 0.25 mg twice daily as needed   Chronic iron deficiency anemia -Continue  iron supplement   Mobility: Encourage ambulation postprocedure.  Needs PT Code Status:   Code Status: Full Code  Nutritional status: Body mass index is 33.23 kg/m.     Diet Order            Diet NPO time specified  Diet effective now                 DVT prophylaxis: SCDs Start: 05/25/20 0248   Antimicrobials:  None Fluid: None Consultants: Orthopedics Family Communication:  Daughter at bedside  Status is: Inpatient  Remains inpatient appropriate because: Needs surgical fixation of the fracture  Dispo: The patient is from: Home              Anticipated d/c is to: Home versus SNF              Patient currently is not medically stable to d/c.   Difficult to place patient No     Infusions:    Scheduled Meds: . amiodarone  200 mg Oral Daily  . amLODipine  2.5 mg Oral Daily  . ferrous sulfate  325 mg Oral BID WC  . furosemide  40 mg Oral Daily  . levothyroxine  100 mcg Oral Q0600  . potassium chloride SA  20 mEq Oral Daily  . rosuvastatin  20 mg Oral Daily  . sacubitril-valsartan  1 tablet Oral BID    Antimicrobials: Anti-infectives (From admission, onward)   None      PRN meds: acetaminophen, clonazePAM, HYDROmorphone (DILAUDID) injection   Objective: Vitals:   05/25/20 0745 05/25/20 0910  BP: 135/65 Marland Kitchen)  177/66  Pulse: 84 77  Resp: 15 17  Temp:  97.9 F (36.6 C)  SpO2: 98% 96%    Intake/Output Summary (Last 24 hours) at 05/25/2020 1116 Last data filed at 05/25/2020 0039 Gross per 24 hour  Intake --  Output 0 ml  Net 0 ml   Filed Weights   05/25/20 0030  Weight: 93.4 kg   Weight change:  Body mass index is 33.23 kg/m.   Physical Exam: General exam: Pleasant, elderly Caucasian female.  Lying on bed.  Not in distress Skin: No rashes, lesions or ulcers. HEENT: Atraumatic, normocephalic, no obvious bleeding Lungs: Clear to auscultation bilaterally CVS: Regular rate and rhythm, no murmur GI/Abd soft, nontender, nondistended, bowel sound  present CNS: Alert, awake, oriented x3 Psychiatry: Mood appropriate Extremities: No pedal edema, no calf tenderness.  Tenderness in left hip due to fracture  Data Review: I have personally reviewed the laboratory data and studies available.  Recent Labs  Lab 05/25/20 0028 05/25/20 0200  WBC 6.7  --   NEUTROABS 5.5  --   HGB 13.3 12.2  HCT 41.2 36.0  MCV 96.9  --   PLT 121*  --    Recent Labs  Lab 05/25/20 0154 05/25/20 0200  NA 140 141  K 3.9 4.3  CL 102 103  CO2 28  --   GLUCOSE 178* 180*  BUN 19 22  CREATININE 1.05* 0.90  CALCIUM 9.1  --     F/u labs ordered Unresulted Labs (From admission, onward)          Start     Ordered   05/26/20 0500  CBC with Differential/Platelet  Daily,   R      05/25/20 1116   05/26/20 5498  Basic metabolic panel  Daily,   R      05/25/20 1116          Signed, Terrilee Croak, MD [facility] 05/25/2020

## 2020-05-25 NOTE — H&P (Signed)
History and Physical    Amanda Sellers MAU:[id number] DOB: 1932/03/25 DOA: 05/25/2020  PCP: Tammi Sou, MD  Patient coming from: Home  I have personally briefly reviewed patient's old medical records in [place]  Chief Complaint: Fall, hip pain  HPI: Amanda Sellers is a 87 y.o. female with medical history significant of A.Fib on Eliquis, dCHF, HTN.  Pt presents to the ED with c/o L hip pain following a mechanical fall.  Severe pain to L hip following fall.  Worse with movement.  Nothing makes it better.  Unable to bear wt.  Associated deformity.  Symptoms constant, persistent, unchanged.  Pt on eliquis.   No SOB, CP, LOC.   ED Course: L proximal femur fx.   Review of Systems: As per HPI, otherwise all review of systems negative.  Past Medical History:  Diagnosis Date  . Anemia of chronic disease   . Anticoagulation adequate, Eliquis 10/16/2014  . Anxiety    clonaz helps this AND her breathing  . Chronic combined systolic and diastolic congestive heart failure ([place]) 56/38/9373   Complicated by atrial fibrillation; Echo 07/23/14: mild LVH, EF 50-55%, Gr 2 DD.;  Echo May 15, 2019: EF 35 to 40%, GRII DD.  (In setting of CHF exacerbation, class III)-> baseline class II symptoms. 09/2019 EF 55-60%, nl wall motion and LV fxn, grd II DD, mod MR.  . Chronic renal insufficiency, stage III (moderate) (HCC)    GFR @ 50  . COPD (chronic obstructive pulmonary disease) ([place])    recently noted on Xray, does not have any problems  . Follicular lymphoma ([place])    Non Hodgkins B cell lymphoma; s/p chemo summer 2016--in remission still as of 09/2018: continue obs q 109mo . HLD (hyperlipidemia)     06/2014  . HTN (hypertension)   . Hypothyroidism   . IFG (impaired fasting glucose)   . Lip cancer    Dr. BJanace Hoardexcised this: invasive SCC--no sign of cancer at ENT f/u 10/2015  . Microcytic anemia    transfused 3 U total in hosp 07/2014  . Obesity (BMI 30-39.9)   . Olecranon  bursitis of right elbow 02/2015  . [place]topenia [place] to antineoplastic chemotherapy [place]   . Persistent atrial fibrillation (HCC)    With RVR; elec cardioversion 11/30/14.  On Amio since 10/2014; Dr. NLenna Gilfordfollowing her from pulm standpoint regarding this med.  .Marland KitchenPositive occult stool blood test 08/08/14   Endoscopies ok 08/2014  . Primary osteoarthritis of both knees 04/2016   Dr. GGladstone Lighter bone on bone--to get euflexxa to both knees.  . Pulmonary hypertension due to left heart disease ([place]    RLCP 05/19/2019: EF 35-40%. D1 50%, RI 70%, dLM-LAD 30%.  mPAP 43 mmHg - (62/19 mmHg), PCWP  28 mmHg (V wave 41 mmHg) LVEDP 34 mmHg  . Pulmonary metastases ([place] 07/24/2014  . Vitamin B 12 deficiency   . Vitamin D deficiency    dose increased to 50 K U tab TWICE weekly 04/2017    Past Surgical History:  Procedure Laterality Date  . ABDOMINAL HYSTERECTOMY  1978  . BONE MARROW BIOPSY  07/24/14  . BREAST BIOPSY    . CARDIOVERSION N/A 10/16/2014   Procedure: CARDIOVERSION;  Surgeon: BLelon Perla MD;  [facility];  Service: Cardiovascular;  Laterality: N/A;  . CARDIOVERSION N/A 11/30/2014   Procedure: CARDIOVERSION;  Surgeon: DLarey Dresser MD;  [place]  Service: Cardiovascular;  Laterality: N/A;  . CATARACT EXTRACTION  2020   OU: Dr. Katy Fitch  . COLONOSCOPY  09/23/14   small hemorrhoids, otherwise normal (performed for IDA and heme+ stool)  . COLONOSCOPY    . MASS EXCISION Left 07/15/2015   Left lower lip mass--invasive SCC w/negative margins.  Procedure: EXCISION MASS;  Surgeon: Melissa Montane, MD;  Location: [facility];  Service: ENT;  Laterality: Left;  Wedge excision left lower lip mass  . NM MYOVIEW LTD  10/29/2014   medium size mild surgery defect in the mid anterior and apical anterior location suggestive of breast attenuation. No reversibility. LOW RISK  . PFTs  02/2015   Restriction with diffusion defect: cardiologist referred her to pulm to help interpret PFTs and decide whether  she has amiodarone toxicity  . RIGHT/LEFT HEART CATH AND CORONARY ANGIOGRAPHY N/A 05/19/2019   Procedure: RIGHT/LEFT HEART CATH AND CORONARY ANGIOGRAPHY;  Surgeon: Troy Sine, MD;  Location: [facility] CV LAB;; EF 35-40%. D1 50%, RI 70%, dLM-LAD 30%.  mPAP 43 mmHg - (62/19 mmHg), PCWP  28 mmHg (V wave 41 mmHg) LVEDP 34 mmHg  . TEE WITHOUT CARDIOVERSION N/A 10/16/2014   Procedure: TRANSESOPHAGEAL ECHOCARDIOGRAM (TEE);  Surgeon: Lelon Perla, MD;  Location[facility];  Service: Cardiovascular;  Laterality: N/A;  . TRANSTHORACIC ECHOCARDIOGRAM  10/09/2019   EF 55 to 60%.  GRII DD.  No R WMA.  Normal PAP and RAP.  Severe LA dilation.  Mild RA dilation.  Moderate MR (likely related to annular dilation in the setting of severe LAE.  Mild aortic valve sclerosis-no stenosis.  . TRANSTHORACIC ECHOCARDIOGRAM  05/15/2019   Acute CHF exacerbation:  EF 35-40%.  Moderately reduced function.  (Previous EF was 55 to 60%) global HK.  GRII DD.  Mild LA dilation.  Normal RV size.  Marland Kitchen UPPER GI ENDOSCOPY  09/23/2014   small hiatus hernia, nodules in stomach biopsied (chronic active erosive atrophic gastritis with intestinal metaplasia--no dysplasia or malignancy) otherwise normal     reports that she has never smoked. She has never used smokeless tobacco. She reports that she does not drink alcohol and does not use drugs.  Allergies  Allergen Reactions  . Aspirin Itching  . Iodinated Diagnostic Agents Rash and Other (See Comments)    CT Contrast    Family History  Problem Relation Age of Onset  . Melanoma Mother   . Stomach cancer Father   . Lymphoma Sister   . Diabetes Daughter      Prior to Admission medications   Medication Sig Start Date End Date Taking? Authorizing Provider  acetaminophen (TYLENOL) 500 MG tablet Take 1,000 mg by mouth every 6 (six) hours as needed for moderate pain or headache.   Yes [provider]  amiodarone (PACERONE) 200 MG tablet TAKE ONE TABLET BY MOUTH  EVERY DAY. Patient taking differently: Take 200 mg by mouth daily. 03/29/20  Yes Leonie Man, MD  amLODipine (NORVASC) 2.5 MG tablet Take 1 tablet (2.5 mg total) by mouth daily. 03/01/20 03/01/21 Yes Leonie Man, MD  apixaban (ELIQUIS) 5 MG TABS tablet Take 1 tablet (5 mg total) by mouth 2 (two) times daily. 11/26/19  Yes Leonie Man, MD  clonazePAM (KLONOPIN) 0.5 MG tablet TAKE 1/2 TO 1 TABLET BY MOUTH TWICE A DAY AS NEEDED Patient taking differently: Take 0.25-0.5 mg by mouth 2 (two) times daily as needed for anxiety. 12/18/18  Yes McGowen, Adrian Blackwater, MD  cyanocobalamin (,VITAMIN B-12,) 1000 MCG/ML injection INJECT 1 ML INTO THE SKIN EVERY 30 DAYS. Patient  taking differently: Inject 1,000 mcg into the muscle every 30 (thirty) days. 10/01/19  Yes McGowen, Adrian Blackwater, MD  ferrous sulfate 325 (65 FE) MG tablet Take 1 tablet (325 mg total) by mouth 2 (two) times daily with a meal. 07/29/14  Yes Ladell Pier, MD  fish oil-omega-3 fatty acids 1000 MG capsule Take 2 g by mouth daily.   Yes [provider]  fluticasone (CUTIVATE) 0.05 % cream Apply to affected area bid prn Patient taking differently: Apply 1 application topically 2 (two) times daily as needed (itching). 08/20/19  Yes McGowen, Adrian Blackwater, MD  furosemide (LASIX) 40 MG tablet TAKE 1 TABLET BY MOUTH EVERY DAY Patient taking differently: Take 40 mg by mouth daily. 05/03/20  Yes Sherran Needs, NP  levothyroxine (SYNTHROID) 100 MCG tablet TAKE 1 TABLET BY MOUTH EVERY DAY BEFORE BREAKFAST Patient taking differently: Take 100 mcg by mouth daily before breakfast. 04/30/20  Yes McGowen, Adrian Blackwater, MD  OXYGEN Inhale into the lungs. 1Lt at bedtime  as needed   Yes [provider]  Potassium Chloride ER 20 MEQ TBCR TAKE 1 TABLET BY MOUTH EVERY DAY Patient taking differently: Take 20 mEq by mouth daily. 12/04/19  Yes McGowen, Adrian Blackwater, MD  rosuvastatin (CRESTOR) 20 MG tablet TAKE 1 TABLET BY MOUTH EVERY DAY Patient taking  differently: Take 20 mg by mouth daily. 09/30/19  Yes Leonie Man, MD  sacubitril-valsartan (ENTRESTO) 24-26 MG Take 1 tablet by mouth 2 (two) times daily. 03/12/20  Yes Leonie Man, MD  traMADol (ULTRAM) 50 MG tablet TAKE 1 TABLET BY MOUTH TWICE A DAY AS NEEDED FOR PAIN Patient taking differently: Take 50 mg by mouth every 12 (twelve) hours as needed for moderate pain. 02/17/19  Yes McGowen, Adrian Blackwater, MD  Vitamin D, Ergocalciferol, (DRISDOL) 1.25 MG (50000 UNIT) CAPS capsule TAKE 1 CAPSULE (50,000 UNITS TOTAL) BY MOUTH 2 (TWO) TIMES A WEEK. Patient taking differently: Take 50,000 Units by mouth 2 (two) times a week. Sunday and thursday 10/27/19  Yes Tammi Sou, MD    Physical Exam: Vitals:   05/25/20 0029 05/25/20 0030 05/25/20 0130 05/25/20 0240  BP: (!) 182/79  (!) 154/85 (!) 166/88  Pulse: 78  76 85  Resp: 16  (!) 24 (!) 29  Temp: 98.3 F (36.8 C)     TempSrc: Oral     SpO2: 100%  96% 100%  Weight:  93.4 kg    Height:  '5\' 6"'  (1.676 m)      Constitutional: NAD, calm, comfortable Eyes: PERRL, lids and conjunctivae normal ENMT: Mucous membranes are moist. Posterior pharynx clear of any exudate or lesions.Normal dentition.  Neck: normal, supple, no masses, no thyromegaly Respiratory: clear to auscultation bilaterally, no wheezing, no crackles. Normal respiratory effort. No accessory muscle use.  Cardiovascular: Regular rate and rhythm, no murmurs / rubs / gallops. No extremity edema. 2+ pedal pulses. No carotid bruits.  Abdomen: no tenderness, no masses palpated. No hepatosplenomegaly. Bowel sounds positive.  Musculoskeletal: Deformity and TTP L hip Skin: no rashes, lesions, ulcers. No induration Neurologic: CN 2-12 grossly intact. Sensation intact, DTR normal. Strength 5/5 in all 4.  Psychiatric: Normal judgment and insight. Alert and oriented x 3. Normal mood.    Labs on Admission: I have personally reviewed following labs and imaging studies  CBC: Recent Labs   Lab 05/25/20 0028 05/25/20 0200  WBC 6.7  --   NEUTROABS 5.5  --   HGB 13.3 12.2  HCT 41.2 36.0  MCV 96.9  --   PLT 121*  --    Basic Metabolic Panel: Recent Labs  Lab 05/25/20 0154 05/25/20 0200  NA 140 141  K 3.9 4.3  CL 102 103  CO2 28  --   GLUCOSE 178* 180*  BUN 19 22  CREATININE 1.05* 0.90  CALCIUM 9.1  --    GFR: Estimated Creatinine Clearance: 52.6 mL/min (by C-G formula based on SCr of 0.9 mg/dL). Liver Function Tests: No results for input(s): AST, ALT, ALKPHOS, BILITOT, PROT, ALBUMIN in the last 168 hours. No results for input(s): LIPASE, AMYLASE in the last 168 hours. No results for input(s): AMMONIA in the last 168 hours. Coagulation Profile: No results for input(s): INR, PROTIME in the last 168 hours. Cardiac Enzymes: No results for input(s): CKTOTAL, CKMB, CKMBINDEX, TROPONINI in the last 168 hours. BNP (last 3 results) No results for input(s): PROBNP in the last 8760 hours. HbA1C: No results for input(s): HGBA1C in the last 72 hours. CBG: No results for input(s): GLUCAP in the last 168 hours. Lipid Profile: No results for input(s): CHOL, HDL, LDLCALC, TRIG, CHOLHDL, LDLDIRECT in the last 72 hours. Thyroid Function Tests: No results for input(s): TSH, T4TOTAL, FREET4, T3FREE, THYROIDAB in the last 72 hours. Anemia Panel: No results for input(s): VITAMINB12, FOLATE, FERRITIN, TIBC, IRON, RETICCTPCT in the last 72 hours. Urine analysis:    Component Value Date/Time   LABSPEC 1.015 10/09/2016 1300   PHURINE 6.0 10/09/2016 1300   GLUCOSEU Negative 10/09/2016 1300   HGBUR Negative 10/09/2016 1300   BILIRUBINUR Negative 10/09/2016 1300   KETONESUR Negative 10/09/2016 1300   PROTEINUR Negative 10/09/2016 1300   UROBILINOGEN 0.2 10/09/2016 1300   NITRITE Negative 10/09/2016 1300   LEUKOCYTESUR Negative 10/09/2016 1300    Radiological Exams on Admission: CT Head Wo Contrast  Result Date: 05/25/2020 CLINICAL DATA:  Fall EXAM: CT HEAD WITHOUT  CONTRAST CT CERVICAL SPINE WITHOUT CONTRAST TECHNIQUE: Multidetector CT imaging of the head and cervical spine was performed following the standard protocol without intravenous contrast. Multiplanar CT image reconstructions of the cervical spine were also generated. COMPARISON:  None. FINDINGS: CT HEAD FINDINGS Brain: Densely calcified extra-axial mass at the left convexity, likely meningioma. There is generalized atrophy without lobar predilection. There is hypoattenuation of the periventricular white matter, most commonly indicating chronic ischemic microangiopathy. Vascular: No abnormal hyperdensity of the major intracranial arteries or dural venous sinuses. No intracranial atherosclerosis. Skull: The visualized skull base, calvarium and extracranial soft tissues are normal. Sinuses/Orbits: No fluid levels or advanced mucosal thickening of the visualized paranasal sinuses. No mastoid or middle ear effusion. The orbits are normal. CT CERVICAL SPINE FINDINGS Alignment: No static subluxation. Facets are aligned. Occipital condyles are normally positioned. Skull base and vertebrae: No acute fracture. Soft tissues and spinal canal: No prevertebral fluid or swelling. No visible canal hematoma. Disc levels: Multilevel facet arthrosis. Degenerative disc disease is greatest at C3-4 and C6-7. No bony spinal canal stenosis. Upper chest: No pneumothorax, pulmonary nodule or pleural effusion. Other: Normal visualized paraspinal cervical soft tissues. IMPRESSION: 1. Chronic ischemic microangiopathy and generalized atrophy without acute intracranial abnormality. 2. Densely calcified left convexity meningioma without associated mass effect. 3. No acute fracture or static subluxation of the cervical spine. Electronically Signed   By: Ulyses Jarred M.D.   On: 05/25/2020 01:55   CT Cervical Spine Wo Contrast  Result Date: 05/25/2020 CLINICAL DATA:  Fall EXAM: CT HEAD WITHOUT CONTRAST CT CERVICAL SPINE WITHOUT CONTRAST TECHNIQUE:  Multidetector CT imaging of the  head and cervical spine was performed following the standard protocol without intravenous contrast. Multiplanar CT image reconstructions of the cervical spine were also generated. COMPARISON:  None. FINDINGS: CT HEAD FINDINGS Brain: Densely calcified extra-axial mass at the left convexity, likely meningioma. There is generalized atrophy without lobar predilection. There is hypoattenuation of the periventricular white matter, most commonly indicating chronic ischemic microangiopathy. Vascular: No abnormal hyperdensity of the major intracranial arteries or dural venous sinuses. No intracranial atherosclerosis. Skull: The visualized skull base, calvarium and extracranial soft tissues are normal. Sinuses/Orbits: No fluid levels or advanced mucosal thickening of the visualized paranasal sinuses. No mastoid or middle ear effusion. The orbits are normal. CT CERVICAL SPINE FINDINGS Alignment: No static subluxation. Facets are aligned. Occipital condyles are normally positioned. Skull base and vertebrae: No acute fracture. Soft tissues and spinal canal: No prevertebral fluid or swelling. No visible canal hematoma. Disc levels: Multilevel facet arthrosis. Degenerative disc disease is greatest at C3-4 and C6-7. No bony spinal canal stenosis. Upper chest: No pneumothorax, pulmonary nodule or pleural effusion. Other: Normal visualized paraspinal cervical soft tissues. IMPRESSION: 1. Chronic ischemic microangiopathy and generalized atrophy without acute intracranial abnormality. 2. Densely calcified left convexity meningioma without associated mass effect. 3. No acute fracture or static subluxation of the cervical spine. Electronically Signed   By: Ulyses Jarred M.D.   On: 05/25/2020 01:55   DG Chest Portable 1 View  Result Date: 05/25/2020 CLINICAL DATA:  Preoperative radiograph, left hip pain after mechanical fall EXAM: PORTABLE CHEST 1 VIEW COMPARISON:  Radiograph 01/26/2017 FINDINGS:  Extensive subpleural predominant reticular changes with diffuse bronchitic changes. No focal consolidative opacity or convincing features of edema. Cardiomegaly similar to priors with a calcified aorta. No acute osseous or soft tissue abnormality. Degenerative changes are present in the imaged spine and shoulders. Telemetry leads overlie the chest. IMPRESSION: 1. No acute cardiopulmonary abnormality. 2. Extensive subpleural predominant reticular changes could reflect underlying pulmonary fibrosis or more chronic interstitial changes. 3. Nodularity seen in the lungs on comparison CT is not as evident on comparison radiography. 4. Stable cardiomegaly. 5.  Aortic Atherosclerosis (ICD10-I70.0). Electronically Signed   By: Lovena Le M.D.   On: 05/25/2020 02:33   DG Hip Unilat W or Wo Pelvis 2-3 Views Left  Result Date: 05/25/2020 CLINICAL DATA:  Left hip pain after fall EXAM: DG HIP (WITH OR WITHOUT PELVIS) 2-3V LEFT COMPARISON:  None. FINDINGS: Medially angulated reverse obliquity intertrochanteric fracture of the proximal left femur. No dislocation at either hip. No other pelvic fracture. IMPRESSION: Medially angulated reverse obliquity intertrochanteric fracture of the proximal left femur. Electronically Signed   By: Ulyses Jarred M.D.   On: 05/25/2020 01:58    EKG: Independently reviewed.  LBBB but review of prior EKGs shows this to be chronic.  Assessment/Plan Principal Problem:   Fracture, proximal femur, left, closed, initial encounter ([place]) Active Problems:   Chronic diastolic CHF (congestive heart failure) (HCC)   Hypertension   Paroxysmal atrial fibrillation ([place]); CHA2DS2Vasc 6 - Eliquis; Amio 100 mg daily   Anticoagulation adequate, Eliquis   LBBB (left bundle branch block)    1. L Proximal femur fx - 1. Holding eliquis 2. Ortho to consult in AM 3. Will let pt eat since last eliquis dose she took was 1630 on 5/2 (presumably wont be getting operated on today 5/3). 4. Hip fx  pathway 5. IV dilaudid PRN pain 2. dCHF - 1. Cont home lasix 3. HTN - 1. Cont home BP meds 4. PAF - 1. Cont  amiodarone 2. Hold eliquis  DVT prophylaxis: SCDs Code Status: Full Family Communication: Daughter at bedside Disposition Plan: SNF vs CIR after fx repair Consults called: EDP spoke with Dr. Lucia Gaskins Admission status: Admit to inpatient  Severity of Illness: The appropriate patient status for this patient is INPATIENT. Inpatient status is judged to be reasonable and necessary in order to provide the required intensity of service to ensure the patient's safety. The patient's presenting symptoms, physical exam findings, and initial radiographic and laboratory data in the context of their chronic comorbidities is felt to place them at high risk for further clinical deterioration. Furthermore, it is not anticipated that the patient will be medically stable for discharge from the hospital within 2 midnights of admission. The following factors support the patient status of inpatient.   IP status for L hip / femur fx requiring OR repair.  * I certify that at the point of admission it is my clinical judgment that the patient will require inpatient hospital care spanning beyond 2 midnights from the point of admission due to high intensity of service, high risk for further deterioration and high frequency of surveillance required.*    Yaxiel Minnie M. DO [facility]  How to contact the [facility] Attending or Consulting provider [place] or covering provider during after hours [place], for this patient?  1. Check the care team in [facility] and look for a) attending/consulting [facility] provider listed and b) the [facility] team listed 2. Log into [email or web address]  Amion Physician Scheduling and messaging for groups and whole hospitals  On call and physician scheduling software for group practices, residents, hospitalists and other medical providers for call, clinic, rotation and shift schedules. [employer]  is a hospital-wide system for scheduling doctors and paging doctors on call. EasyPlot is for scientific plotting and data analysis.  [email or web address]  and use [place]'s universal password to access. If you do not have the password, please contact the hospital operator.  3. Locate the [facility] provider you are looking for under [facility] and page to a number that you can be directly reached. 4. If you still have difficulty reaching the provider, please page the [facility] (Director on Call) for the Hospitalists listed on amion for assistance.  05/25/2020, 3:09 AM

## 2020-05-25 NOTE — ED Notes (Signed)
Received verbal report from [place] at this time

## 2020-05-25 NOTE — ED Notes (Signed)
REPORT GIVEN TO 5N nurse

## 2020-05-25 NOTE — Consult Note (Signed)
Reason for Consult:Left hip fx Referring Physician: Sheran Luz Time called: [id number] Time at bedside: Amanda Sellers is an 87 y.o. female.  HPI: Amanda Sellers got up last night and fell. She does not remember what caused the fall. She had immediate left hip pain and could not get up. She was brought to the ED where x-rays showed a hip fx and orthopedic surgery was consulted. She lives at home with her son and ambulates with a RW.  Past Medical History:  Diagnosis Date  . Anemia of chronic disease   . Anticoagulation adequate, Eliquis 10/16/2014  . Anxiety    clonaz helps this AND her breathing  . Chronic combined systolic and diastolic congestive heart failure ([place]) 37/62/8315   Complicated by atrial fibrillation; Echo 07/23/14: mild LVH, EF 50-55%, Gr 2 DD.;  Echo May 15, 2019: EF 35 to 40%, GRII DD.  (In setting of CHF exacerbation, class III)-> baseline class II symptoms. 09/2019 EF 55-60%, nl wall motion and LV fxn, grd II DD, mod MR.  . Chronic renal insufficiency, stage III (moderate) (HCC)    GFR @ 50  . COPD (chronic obstructive pulmonary disease) ([place])    recently noted on Xray, does not have any problems  . Follicular lymphoma ([place])    Non Hodgkins B cell lymphoma; s/p chemo summer 2016--in remission still as of 09/2018: continue obs q 19mo . HLD (hyperlipidemia)     06/2014  . HTN (hypertension)   . Hypothyroidism   . IFG (impaired fasting glucose)   . Lip cancer    Dr. BJanace Hoardexcised this: invasive SCC--no sign of cancer at ENT f/u 10/2015  . Microcytic anemia    transfused 3 U total in hosp 07/2014  . Obesity (BMI 30-39.9)   . Olecranon bursitis of right elbow 02/2015  . P[place]topenia due to antineoplastic chemotherapy ([place]   . Persistent atrial fibrillation (HCC)    With RVR; elec cardioversion 11/30/14.  On Amio since 10/2014; Dr. NLenna Gilfordfollowing her from pulm standpoint regarding this med.  .Marland KitchenPositive occult stool blood test 08/08/14   Endoscopies ok 08/2014  . Primary  osteoarthritis of both knees 04/2016   Dr. GGladstone Lighter bone on bone--to get euflexxa to both knees.  . Pulmonary hypertension due to left heart disease ([place]    RLCP 05/19/2019: EF 35-40%. D1 50%, RI 70%, dLM-LAD 30%.  mPAP 43 mmHg - (62/19 mmHg), PCWP  28 mmHg (V wave 41 mmHg) LVEDP 34 mmHg  . Pulmonary metastases ([place] 07/24/2014  . Vitamin B 12 deficiency   . Vitamin D deficiency    dose increased to 50 K U tab TWICE weekly 04/2017    Past Surgical History:  Procedure Laterality Date  . ABDOMINAL HYSTERECTOMY  1978  . BONE MARROW BIOPSY  07/24/14  . BREAST BIOPSY    . CARDIOVERSION N/A 10/16/2014   Procedure: CARDIOVERSION;  Surgeon: BLelon Perla MD;  [facility];  Service: Cardiovascular;  Laterality: N/A;  . CARDIOVERSION N/A 11/30/2014   Procedure: CARDIOVERSION;  Surgeon: DLarey Dresser MD;  [facility]  Service: Cardiovascular;  Laterality: N/A;  . CATARACT EXTRACTION  2020   OU: Dr. GKaty Fitch . COLONOSCOPY  09/23/14   small hemorrhoids, otherwise normal (performed for IDA and heme+ stool)  . COLONOSCOPY    . MASS EXCISION Left 07/15/2015   Left lower lip mass--invasive SCC w/negative margins.  Procedure: EXCISION MASS;  Surgeon: JMelissa Montane MD;  [facility]  Service: ENT;  Laterality: Left;  Wedge excision left lower lip mass  . NM MYOVIEW LTD  10/29/2014   medium size mild surgery defect in the mid anterior and apical anterior location suggestive of breast attenuation. No reversibility. LOW RISK  . PFTs  02/2015   Restriction with diffusion defect: cardiologist referred her to pulm to help interpret PFTs and decide whether she has amiodarone toxicity  . RIGHT/LEFT HEART CATH AND CORONARY ANGIOGRAPHY N/A 05/19/2019   Procedure: RIGHT/LEFT HEART CATH AND CORONARY ANGIOGRAPHY;  Surgeon: Troy Sine, MD;  Location: [facility];; EF 35-40%. D1 50%, RI 70%, dLM-LAD 30%.  mPAP 43 mmHg - (62/19 mmHg), PCWP  28 mmHg (V wave 41 mmHg) LVEDP 34 mmHg  . TEE  WITHOUT CARDIOVERSION N/A 10/16/2014   Procedure: TRANSESOPHAGEAL ECHOCARDIOGRAM (TEE);  Surgeon: Lelon Perla, MD;  [facility];  Service: Cardiovascular;  Laterality: N/A;  . TRANSTHORACIC ECHOCARDIOGRAM  10/09/2019   EF 55 to 60%.  GRII DD.  No R WMA.  Normal PAP and RAP.  Severe LA dilation.  Mild RA dilation.  Moderate MR (likely related to annular dilation in the setting of severe LAE.  Mild aortic valve sclerosis-no stenosis.  . TRANSTHORACIC ECHOCARDIOGRAM  05/15/2019   Acute CHF exacerbation:  EF 35-40%.  Moderately reduced function.  (Previous EF was 55 to 60%) global HK.  GRII DD.  Mild LA dilation.  Normal RV size.  Marland Kitchen UPPER GI ENDOSCOPY  09/23/2014   small hiatus hernia, nodules in stomach biopsied (chronic active erosive atrophic gastritis with intestinal metaplasia--no dysplasia or malignancy) otherwise normal    Family History  Problem Relation Age of Onset  . Melanoma Mother   . Stomach cancer Father   . Lymphoma Sister   . Diabetes Daughter     Social History:  reports that she has never smoked. She has never used smokeless tobacco. She reports that she does not drink alcohol and does not use drugs.  Allergies:  Allergies  Allergen Reactions  . Aspirin Itching  . Iodinated Diagnostic Agents Rash and Other (See Comments)    CT Contrast    Medications: I have reviewed the patient's current medications.  Results for orders placed or performed during the hospital encounter of 05/25/20 (from the past 48 hour(s))  CBC with Differential/Platelet     Status: Abnormal   Collection Time: 05/25/20 12:28 AM  Result Value Ref Range   WBC 6.7 4.0 - 10.5 K/uL   RBC 4.25 3.87 - 5.11 MIL/uL   Hemoglobin 13.3 12.0 - 15.0 g/dL   HCT 41.2 36.0 - 46.0 %   MCV 96.9 80.0 - 100.0 fL   MCH 31.3 26.0 - 34.0 pg   MCHC 32.3 30.0 - 36.0 g/dL   RDW 12.4 11.5 - 15.5 %   Platelets 121 (L) 150 - 400 K/uL   nRBC 0.0 0.0 - 0.2 %   Neutrophils Relative % 83 %   Neutro Abs 5.5  1.7 - 7.7 K/uL   Lymphocytes Relative 11 %   Lymphs Abs 0.7 0.7 - 4.0 K/uL   Monocytes Relative 6 %   Monocytes Absolute 0.4 0.1 - 1.0 K/uL   Eosinophils Relative 0 %   Eosinophils Absolute 0.0 0.0 - 0.5 K/uL   Basophils Relative 0 %   Basophils Absolute 0.0 0.0 - 0.1 K/uL   Immature Granulocytes 0 %   Abs Immature Granulocytes 0.03 0.00 - 0.07 K/uL    Comment: Performed at [facility], [address]  [place], [place] [address]  Resp Panel by RT-PCR (Flu A&B, Covid) Nasopharyngeal Swab     Status: None   Collection Time: 05/25/20 12:29 AM   Specimen: Nasopharyngeal Swab; Nasopharyngeal(NP) swabs in vial transport medium  Result Value Ref Range   SARS Coronavirus 2 by RT PCR NEGATIVE NEGATIVE    Comment: (NOTE) SARS-CoV-2 target nucleic acids are NOT DETECTED.  The SARS-CoV-2 RNA is generally detectable in upper respiratory specimens during the acute phase of infection. The lowest concentration of SARS-CoV-2 viral copies this assay can detect is 138 copies/mL. A negative result does not preclude SARS-Cov-2 infection and should not be used as the sole basis for treatment or other patient management decisions. A negative result may occur with  improper specimen collection/handling, submission of specimen other than nasopharyngeal swab, presence of viral mutation(s) within the areas targeted by this assay, and inadequate number of viral copies(<138 copies/mL). A negative result must be combined with clinical observations, patient history, and epidemiological information. The expected result is Negative.  Fact Sheet for Patients:  EntrepreneurPulse.com.au  Fact Sheet for Healthcare Providers:  IncredibleEmployment.be  This test is no t yet approved or cleared by the [place] FDA and  has been authorized for detection and/or diagnosis of SARS-CoV-2 by FDA under an Emergency Use Authorization (EUA). This EUA will remain  in effect  (meaning this test can be used) for the duration of the COVID-19 declaration under Section 564(b)(1) of the Act, 21 U.S.C.section 360bbb-3(b)(1), unless the authorization is terminated  or revoked sooner.       Influenza A by PCR NEGATIVE NEGATIVE   Influenza B by PCR NEGATIVE NEGATIVE    Comment: (NOTE) The Xpert Xpress SARS-CoV-2/FLU/RSV plus assay is intended as an aid in the diagnosis of influenza from Nasopharyngeal swab specimens and should not be used as a sole basis for treatment. Nasal washings and aspirates are unacceptable for Xpert Xpress SARS-CoV-2/FLU/RSV testing.  Fact Sheet for Patients: EntrepreneurPulse.com.au  Fact Sheet for Healthcare Providers: IncredibleEmployment.be  This test is not yet approved or cleared by the [place] FDA and has been authorized for detection and/or diagnosis of SARS-CoV-2 by FDA under an Emergency Use Authorization (EUA). This EUA will remain in effect (meaning this test can be used) for the duration of the COVID-19 declaration under Section 564(b)(1) of the Act, 21 U.S.C. section 360bbb-3(b)(1), unless the authorization is terminated or revoked.  Performed at [facility], [address] [place], [place] [address]   Basic metabolic panel     Status: Abnormal   Collection Time: 05/25/20  1:54 AM  Result Value Ref Range   Sodium 140 135 - 145 mmol/L   Potassium 3.9 3.5 - 5.1 mmol/L   Chloride 102 98 - 111 mmol/L   CO2 28 22 - 32 mmol/L   Glucose, Bld 178 (H) 70 - 99 mg/dL    Comment: Glucose reference range applies only to samples taken after fasting for at least 8 hours.   BUN 19 8 - 23 mg/dL   Creatinine, Ser 1.05 (H) 0.44 - 1.00 mg/dL   Calcium 9.1 8.9 - 10.3 mg/dL   GFR, Estimated 52 (L) >60 mL/min    Comment: (NOTE) Calculated using the CKD-EPI Creatinine Equation (2021)    Anion gap 10 5 - 15    Comment: Performed at [facility] [address] [place],  [place] [address]  I-stat chem 8, ED (not at [facility] or [facility])     Status: Abnormal   Collection  Time: 05/25/20  2:00 AM  Result Value Ref Range   Sodium 141 135 - 145 mmol/L   Potassium 4.3 3.5 - 5.1 mmol/L   Chloride 103 98 - 111 mmol/L   BUN 22 8 - 23 mg/dL   Creatinine, Ser 0.90 0.44 - 1.00 mg/dL   Glucose, Bld 180 (H) 70 - 99 mg/dL    Comment: Glucose reference range applies only to samples taken after fasting for at least 8 hours.   Calcium, Ion 1.17 1.15 - 1.40 mmol/L   TCO2 29 22 - 32 mmol/L   Hemoglobin 12.2 12.0 - 15.0 g/dL   HCT 36.0 36.0 - 46.0 %    CT Head Wo Contrast  Result Date: 05/25/2020 CLINICAL DATA:  Fall EXAM: CT HEAD WITHOUT CONTRAST CT CERVICAL SPINE WITHOUT CONTRAST TECHNIQUE: Multidetector CT imaging of the head and cervical spine was performed following the standard protocol without intravenous contrast. Multiplanar CT image reconstructions of the cervical spine were also generated. COMPARISON:  None. FINDINGS: CT HEAD FINDINGS Brain: Densely calcified extra-axial mass at the left convexity, likely meningioma. There is generalized atrophy without lobar predilection. There is hypoattenuation of the periventricular white matter, most commonly indicating chronic ischemic microangiopathy. Vascular: No abnormal hyperdensity of the major intracranial arteries or dural venous sinuses. No intracranial atherosclerosis. Skull: The visualized skull base, calvarium and extracranial soft tissues are normal. Sinuses/Orbits: No fluid levels or advanced mucosal thickening of the visualized paranasal sinuses. No mastoid or middle ear effusion. The orbits are normal. CT CERVICAL SPINE FINDINGS Alignment: No static subluxation. Facets are aligned. Occipital condyles are normally positioned. Skull base and vertebrae: No acute fracture. Soft tissues and spinal canal: No prevertebral fluid or swelling. No visible canal hematoma. Disc levels: Multilevel facet arthrosis. Degenerative disc disease is greatest  at C3-4 and C6-7. No bony spinal canal stenosis. Upper chest: No pneumothorax, pulmonary nodule or pleural effusion. Other: Normal visualized paraspinal cervical soft tissues. IMPRESSION: 1. Chronic ischemic microangiopathy and generalized atrophy without acute intracranial abnormality. 2. Densely calcified left convexity meningioma without associated mass effect. 3. No acute fracture or static subluxation of the cervical spine. Electronically Signed   By: Ulyses Jarred M.D.   On: 05/25/2020 01:55   CT Cervical Spine Wo Contrast  Result Date: 05/25/2020 CLINICAL DATA:  Fall EXAM: CT HEAD WITHOUT CONTRAST CT CERVICAL SPINE WITHOUT CONTRAST TECHNIQUE: Multidetector CT imaging of the head and cervical spine was performed following the standard protocol without intravenous contrast. Multiplanar CT image reconstructions of the cervical spine were also generated. COMPARISON:  None. FINDINGS: CT HEAD FINDINGS Brain: Densely calcified extra-axial mass at the left convexity, likely meningioma. There is generalized atrophy without lobar predilection. There is hypoattenuation of the periventricular white matter, most commonly indicating chronic ischemic microangiopathy. Vascular: No abnormal hyperdensity of the major intracranial arteries or dural venous sinuses. No intracranial atherosclerosis. Skull: The visualized skull base, calvarium and extracranial soft tissues are normal. Sinuses/Orbits: No fluid levels or advanced mucosal thickening of the visualized paranasal sinuses. No mastoid or middle ear effusion. The orbits are normal. CT CERVICAL SPINE FINDINGS Alignment: No static subluxation. Facets are aligned. Occipital condyles are normally positioned. Skull base and vertebrae: No acute fracture. Soft tissues and spinal canal: No prevertebral fluid or swelling. No visible canal hematoma. Disc levels: Multilevel facet arthrosis. Degenerative disc disease is greatest at C3-4 and C6-7. No bony spinal canal stenosis. Upper  chest: No pneumothorax, pulmonary nodule or pleural effusion. Other: Normal visualized paraspinal cervical soft tissues. IMPRESSION: 1. Chronic ischemic  microangiopathy and generalized atrophy without acute intracranial abnormality. 2. Densely calcified left convexity meningioma without associated mass effect. 3. No acute fracture or static subluxation of the cervical spine. Electronically Signed   By: Ulyses Jarred M.D.   On: 05/25/2020 01:55   DG Chest Portable 1 View  Result Date: 05/25/2020 CLINICAL DATA:  Preoperative radiograph, left hip pain after mechanical fall EXAM: PORTABLE CHEST 1 VIEW COMPARISON:  Radiograph 01/26/2017 FINDINGS: Extensive subpleural predominant reticular changes with diffuse bronchitic changes. No focal consolidative opacity or convincing features of edema. Cardiomegaly similar to priors with a calcified aorta. No acute osseous or soft tissue abnormality. Degenerative changes are present in the imaged spine and shoulders. Telemetry leads overlie the chest. IMPRESSION: 1. No acute cardiopulmonary abnormality. 2. Extensive subpleural predominant reticular changes could reflect underlying pulmonary fibrosis or more chronic interstitial changes. 3. Nodularity seen in the lungs on comparison CT is not as evident on comparison radiography. 4. Stable cardiomegaly. 5.  Aortic Atherosclerosis (ICD10-I70.0). Electronically Signed   By: Lovena Le M.D.   On: 05/25/2020 02:33   DG Hip Unilat W or Wo Pelvis 2-3 Views Left  Result Date: 05/25/2020 CLINICAL DATA:  Left hip pain after fall EXAM: DG HIP (WITH OR WITHOUT PELVIS) 2-3V LEFT COMPARISON:  None. FINDINGS: Medially angulated reverse obliquity intertrochanteric fracture of the proximal left femur. No dislocation at either hip. No other pelvic fracture. IMPRESSION: Medially angulated reverse obliquity intertrochanteric fracture of the proximal left femur. Electronically Signed   By: Ulyses Jarred M.D.   On: 05/25/2020 01:58    Review  of Systems  HENT: Negative for ear discharge, ear pain, hearing loss and tinnitus.   Eyes: Negative for photophobia and pain.  Respiratory: Negative for cough and shortness of breath.   Cardiovascular: Negative for chest pain.  Gastrointestinal: Negative for abdominal pain, nausea and vomiting.  Genitourinary: Negative for dysuria, flank pain, frequency and urgency.  Musculoskeletal: Positive for arthralgias (Left hip). Negative for back pain, myalgias and neck pain.  Neurological: Negative for dizziness and headaches.  Hematological: Does not bruise/bleed easily.  Psychiatric/Behavioral: The patient is not nervous/anxious.    Blood pressure 135/65, pulse 84, temperature 98.3 F (36.8 C), temperature source Oral, resp. rate 15, height '5\' 6"'  (1.676 m), weight 93.4 kg, SpO2 98 %. Physical Exam Constitutional:      General: She is not in acute distress.    Appearance: She is well-developed. She is not diaphoretic.  HENT:     Head: Normocephalic and atraumatic.  Eyes:     General: No scleral icterus.       Right eye: No discharge.        Left eye: No discharge.     Conjunctiva/sclera: Conjunctivae normal.  Cardiovascular:     Rate and Rhythm: Normal rate and regular rhythm.  Pulmonary:     Effort: Pulmonary effort is normal. No respiratory distress.  Musculoskeletal:     Cervical back: Normal range of motion.     Comments: LLE No traumatic wounds, ecchymosis, or rash  Severe TTP hip, mod TTP knee  No knee or ankle effusion  Knee stable to varus/ valgus and anterior/posterior stress  Sens DPN, SPN, TN intact  Motor EHL, ext, flex, evers 5/5  DP 2+, PT 0, No significant edema  Skin:    General: Skin is warm and dry.  Neurological:     Mental Status: She is alert.  Psychiatric:        Behavior: Behavior normal.  Assessment/Plan: Left hip fx -- Plan IMN today with Dr. Lucia Gaskins. Please keep NPO. Multiple medical problems including afib on Eliquis, CHF, and HTN -- per primary  service    Lisette Abu, PA-C Orthopedic Surgery [phone] 05/25/2020, 9:09 AM

## 2020-05-25 NOTE — ED Notes (Signed)
Pt appears to be sleeping. O2 at 97%

## 2020-05-25 NOTE — ED Provider Notes (Signed)
[facility] EMERGENCY DEPARTMENT Provider Note   CSN: [id number] Arrival date & time: 05/25/20  0023     History Chief Complaint  Patient presents with  . Fall    Amanda Sellers is a 87 y.o. female.  The history is provided by the patient.  Fall This is a new problem. The current episode started less than 1 hour ago. The problem has not changed since onset.Pertinent negatives include no chest pain, no abdominal pain, no headaches and no shortness of breath. Nothing aggravates the symptoms. Nothing relieves the symptoms. She has tried nothing for the symptoms. The treatment provided no relief.  Fall on blood thinners with left hip pain.  No LOC.  No CP, no SOB, no n/v/d.     Past Medical History:  Diagnosis Date  . Anemia of chronic disease   . Anticoagulation adequate, Eliquis 10/16/2014  . Anxiety    clonaz helps this AND her breathing  . Chronic combined systolic and diastolic congestive heart failure ([place]) 08/67/6195   Complicated by atrial fibrillation; Echo 07/23/14: mild LVH, EF 50-55%, Gr 2 DD.;  Echo  22, 2021: EF 35 to 40%, GRII DD.  (In setting of CHF exacerbation, class III)-> baseline class II symptoms. 09/2019 EF 55-60%, nl wall motion and LV fxn, grd II DD, mod MR.  . Chronic renal insufficiency, stage III (moderate) (HCC)    GFR @ 50  . COPD (chronic obstructive pulmonary disease) ([place])    recently noted on Xray, does not have any problems  . Follicular lymphoma ([place])    Non Hodgkins B cell lymphoma; s/p chemo summer 2016--in remission still as of 09/2018: continue obs q 60mo . HLD (hyperlipidemia)     06/2014  . HTN (hypertension)   . Hypothyroidism   . IFG (impaired fasting glucose)   . Lip cancer    Dr. BJanace Hoardexcised this: invasive SCC--no sign of cancer at ENT f/u 10/2015  . Microcytic anemia    transfused 3 U total in hosp 07/2014  . Obesity (BMI 30-39.9)   . Olecranon bursitis of right elbow 02/2015  . Pancytopenia due to  antineoplastic chemotherapy ([place]   . Persistent atrial fibrillation (HCC)    With RVR; elec cardioversion 11/30/14.  On Amio since 10/2014; Dr. NLenna Gilfordfollowing her from pulm standpoint regarding this med.  .Marland KitchenPositive occult stool blood test 08/08/14   Endoscopies ok 08/2014  . Primary osteoarthritis of both knees 04/2016   Dr. GGladstone Lighter bone on bone--to get euflexxa to both knees.  . Pulmonary hypertension due to left heart disease ([place]    RLCP 05/19/2019: EF 35-40%. D1 50%, RI 70%, dLM-LAD 30%.  mPAP 43 mmHg - (62/19 mmHg), PCWP  28 mmHg (V wave 41 mmHg) LVEDP 34 mmHg  . Pulmonary metastases ([place] 07/24/2014  . Vitamin B 12 deficiency   . Vitamin D deficiency    dose increased to 50 K U tab TWICE weekly 04/2017    Patient Active Problem List   Diagnosis Date Noted  . Restrictive lung disease 10/01/2019  . Hypothyroidism 05/21/2019  . Pulmonary hypertension due to left heart disease ([place]   . Coronary artery disease involving native coronary artery of native heart without angina pectoris   . Dilated cardiomyopathy ([place]   . Stomatitis 08/14/2016  . Osteoarthritis 05/08/2016  . Pernicious anemia 06/29/2015  . Abnormal finding on pulmonary function testing 05/10/2015  . History of pernicious anemia 05/10/2015  . Other iron deficiency anemias 05/10/2015  . On  amiodarone therapy 02/11/2015  . Anticoagulation adequate, Eliquis 10/16/2014  . Paroxysmal atrial fibrillation ([place]); CHA2DS2Vasc 6 - Eliquis; Amio 100 mg daily 10/15/2014  . Microcytic anemia 08/09/2014  . Positive occult stool blood test 08/08/2014  . Symptomatic anemia 08/07/2014  . Acute on chronic diastolic CHF (congestive heart failure), NYHA class 3 ([place]) 08/07/2014  . Pancytopenia due to antineoplastic chemotherapy ([place]) 08/07/2014  . Follicular lymphoma ([place]) 07/29/2014  . Lymphadenopathy   . Anemia 07/22/2014  . Dyspnea 07/22/2014  . Chronic combined systolic and diastolic CHF (congestive heart failure) ([place]) 07/22/2014   . Hypertension 07/22/2014  . Hyperlipidemia 07/22/2014  . Osteoporosis 03/01/2006    Past Surgical History:  Procedure Laterality Date  . ABDOMINAL HYSTERECTOMY  1978  . BONE MARROW BIOPSY  07/24/14  . BREAST BIOPSY    . CARDIOVERSION N/A 10/16/2014   Procedure: CARDIOVERSION;  Surgeon: Lelon Perla, MD;  Location: [facility] ENDOSCOPY;  Service: Cardiovascular;  Laterality: N/A;  . CARDIOVERSION N/A 11/30/2014   Procedure: CARDIOVERSION;  Surgeon: Larey Dresser, MD;  Location: [facility];  Service: Cardiovascular;  Laterality: N/A;  . CATARACT EXTRACTION  2020   OU: Dr. Katy Fitch  . COLONOSCOPY  09/23/14   small hemorrhoids, otherwise normal (performed for IDA and heme+ stool)  . COLONOSCOPY    . MASS EXCISION Left 07/15/2015   Left lower lip mass--invasive SCC w/negative margins.  Procedure: EXCISION MASS;  Surgeon: Melissa Montane, MD;  Location: [facility];  Service: ENT;  Laterality: Left;  Wedge excision left lower lip mass  . NM MYOVIEW LTD  10/29/2014   medium size mild surgery defect in the mid anterior and apical anterior location suggestive of breast attenuation. No reversibility. LOW RISK  . PFTs  02/2015   Restriction with diffusion defect: cardiologist referred her to pulm to help interpret PFTs and decide whether she has amiodarone toxicity  . RIGHT/LEFT HEART CATH AND CORONARY ANGIOGRAPHY N/A 05/19/2019   Procedure: RIGHT/LEFT HEART CATH AND CORONARY ANGIOGRAPHY;  Surgeon: Troy Sine, MD;  Location: [facility] CV LAB;; EF 35-40%. D1 50%, RI 70%, dLM-LAD 30%.  mPAP 43 mmHg - (62/19 mmHg), PCWP  28 mmHg (V wave 41 mmHg) LVEDP 34 mmHg  . TEE WITHOUT CARDIOVERSION N/A 10/16/2014   Procedure: TRANSESOPHAGEAL ECHOCARDIOGRAM (TEE);  Surgeon: Lelon Perla, MD;  [facility];  Service: Cardiovascular;  Laterality: N/A;  . TRANSTHORACIC ECHOCARDIOGRAM  10/09/2019   EF 55 to 60%.  GRII DD.  No R WMA.  Normal PAP and RAP.  Severe LA dilation.  Mild RA dilation.  Moderate MR (likely  related to annular dilation in the setting of severe LAE.  Mild aortic valve sclerosis-no stenosis.  . TRANSTHORACIC ECHOCARDIOGRAM  05/15/2019   Acute CHF exacerbation:  EF 35-40%.  Moderately reduced function.  (Previous EF was 55 to 60%) global HK.  GRII DD.  Mild LA dilation.  Normal RV size.  Marland Kitchen UPPER GI ENDOSCOPY  09/23/2014   small hiatus hernia, nodules in stomach biopsied (chronic active erosive atrophic gastritis with intestinal metaplasia--no dysplasia or malignancy) otherwise normal     OB History   No obstetric history on file.     Family History  Problem Relation Age of Onset  . Melanoma Mother   . Stomach cancer Father   . Lymphoma Sister   . Diabetes Daughter     Social History   Tobacco Use  . Smoking status: Never Smoker  . Smokeless tobacco: Never Used  Vaping Use  . Vaping Use:  Never used  Substance Use Topics  . Alcohol use: No    Alcohol/week: 0.0 standard drinks  . Drug use: No    Home Medications Prior to Admission medications   Medication Sig Start Date End Date Taking? Authorizing Provider  amiodarone (PACERONE) 200 MG tablet TAKE ONE TABLET BY MOUTH EVERY DAY. 03/29/20   Leonie Man, MD  amLODipine (NORVASC) 2.5 MG tablet Take 1 tablet (2.5 mg total) by mouth daily. 03/01/20 03/01/21  Leonie Man, MD  apixaban (ELIQUIS) 5 MG TABS tablet Take 1 tablet (5 mg total) by mouth 2 (two) times daily. 11/26/19   Leonie Man, MD  clonazePAM (KLONOPIN) 0.5 MG tablet TAKE 1/2 TO 1 TABLET BY MOUTH TWICE A DAY AS NEEDED 12/18/18   McGowen, Adrian Blackwater, MD  cyanocobalamin (,VITAMIN B-12,) 1000 MCG/ML injection INJECT 1 ML INTO THE SKIN EVERY 30 DAYS. 10/01/19   McGowen, Adrian Blackwater, MD  ferrous sulfate 325 (65 FE) MG tablet Take 1 tablet (325 mg total) by mouth 2 (two) times daily with a meal. 07/29/14   Ladell Pier, MD  fish oil-omega-3 fatty acids 1000 MG capsule Take 2 g by mouth daily.    [provider]  fluticasone (CUTIVATE) 0.05 % cream Apply  to affected area bid prn 08/20/19   McGowen, Adrian Blackwater, MD  furosemide (LASIX) 40 MG tablet TAKE 1 TABLET BY MOUTH EVERY DAY 05/03/20   Sherran Needs, NP  levothyroxine (SYNTHROID) 100 MCG tablet TAKE 1 TABLET BY MOUTH EVERY DAY BEFORE BREAKFAST 04/30/20   McGowen, Adrian Blackwater, MD  OXYGEN Inhale into the lungs. 1Lt at bedtime    [provider]  Potassium Chloride ER 20 MEQ TBCR TAKE 1 TABLET BY MOUTH EVERY DAY 12/04/19   McGowen, Adrian Blackwater, MD  rosuvastatin (CRESTOR) 20 MG tablet TAKE 1 TABLET BY MOUTH EVERY DAY 09/30/19   Leonie Man, MD  sacubitril-valsartan (ENTRESTO) 24-26 MG Take 1 tablet by mouth 2 (two) times daily. 03/12/20   Leonie Man, MD  traMADol (ULTRAM) 50 MG tablet TAKE 1 TABLET BY MOUTH TWICE A DAY AS NEEDED FOR PAIN 02/17/19   McGowen, Adrian Blackwater, MD  Vitamin D, Ergocalciferol, (DRISDOL) 1.25 MG (50000 UNIT) CAPS capsule TAKE 1 CAPSULE (50,000 UNITS TOTAL) BY MOUTH 2 (TWO) TIMES A WEEK. 10/27/19   McGowen, Adrian Blackwater, MD    Allergies    Aspirin and Iodinated diagnostic agents  Review of Systems   Review of Systems  Constitutional: Negative for fever.  HENT: Negative for congestion.   Eyes: Negative for visual disturbance.  Respiratory: Negative for shortness of breath.   Cardiovascular: Negative for chest pain.  Gastrointestinal: Negative for abdominal pain.  Genitourinary: Negative for difficulty urinating.  Musculoskeletal: Positive for arthralgias.  Skin: Negative for rash.  Neurological: Negative for headaches.  Psychiatric/Behavioral: Negative for agitation.  All other systems reviewed and are negative.   Physical Exam Updated Vital Signs BP (!) 182/79 (BP Location: Right Arm)   Pulse 78   Temp 98.3 F (36.8 C) (Oral)   Resp 16   Ht 5' 6" (1.676 m)   Wt 93.4 kg   SpO2 100%   BMI 33.23 kg/m   Physical Exam Vitals and nursing note reviewed.  Constitutional:      General: She is not in acute distress.    Appearance: Normal appearance.  HENT:      Head: Normocephalic and atraumatic.     Nose: Nose normal.  Eyes:  Extraocular Movements: Extraocular movements intact.     Conjunctiva/sclera: Conjunctivae normal.     Pupils: Pupils are equal, round, and reactive to light.  Cardiovascular:     Rate and Rhythm: Normal rate and regular rhythm.     Pulses: Normal pulses.     Heart sounds: Normal heart sounds.  Pulmonary:     Effort: Pulmonary effort is normal.     Breath sounds: Normal breath sounds.  Abdominal:     General: Abdomen is flat. Bowel sounds are normal.     Palpations: Abdomen is soft.     Tenderness: There is no abdominal tenderness. There is no guarding.  Musculoskeletal:        General: Normal range of motion.     Cervical back: Normal range of motion and neck supple.     Left hip: Deformity, tenderness and bony tenderness present.     Comments: L externally rotated and foreshortened  Skin:    General: Skin is warm and dry.     Capillary Refill: Capillary refill takes less than 2 seconds.  Neurological:     General: No focal deficit present.     Mental Status: She is alert and oriented to person, place, and time.     Deep Tendon Reflexes: Reflexes normal.  Psychiatric:        Mood and Affect: Mood normal.        Behavior: Behavior normal.     ED Results / Procedures / Treatments   Labs (all labs ordered are listed, but only abnormal results are displayed) Results for orders placed or performed during the hospital encounter of 05/25/20  Resp Panel by RT-PCR (Flu A&B, Covid) Nasopharyngeal Swab   Specimen: Nasopharyngeal Swab; Nasopharyngeal(NP) swabs in vial transport medium  Result Value Ref Range   SARS Coronavirus 2 by RT PCR NEGATIVE NEGATIVE   Influenza A by PCR NEGATIVE NEGATIVE   Influenza B by PCR NEGATIVE NEGATIVE  CBC with Differential/Platelet  Result Value Ref Range   WBC 6.7 4.0 - 10.5 K/uL   RBC 4.25 3.87 - 5.11 MIL/uL   Hemoglobin 13.3 12.0 - 15.0 g/dL   HCT 41.2 36.0 - 46.0 %   MCV  96.9 80.0 - 100.0 fL   MCH 31.3 26.0 - 34.0 pg   MCHC 32.3 30.0 - 36.0 g/dL   RDW 12.4 11.5 - 15.5 %   Platelets 121 (L) 150 - 400 K/uL   nRBC 0.0 0.0 - 0.2 %   Neutrophils Relative % 83 %   Neutro Abs 5.5 1.7 - 7.7 K/uL   Lymphocytes Relative 11 %   Lymphs Abs 0.7 0.7 - 4.0 K/uL   Monocytes Relative 6 %   Monocytes Absolute 0.4 0.1 - 1.0 K/uL   Eosinophils Relative 0 %   Eosinophils Absolute 0.0 0.0 - 0.5 K/uL   Basophils Relative 0 %   Basophils Absolute 0.0 0.0 - 0.1 K/uL   Immature Granulocytes 0 %   Abs Immature Granulocytes 0.03 0.00 - 0.07 K/uL   No results found.  EKG  EKG Interpretation  Date/Time:  Tuesday May 25 2020 00:30:45 EDT Ventricular Rate:  74 PR Interval:  220 QRS Duration: 148 QT Interval:  467 QTC Calculation: 519 R Axis:   8 Text Interpretation: Sinus rhythm Prolonged PR interval Left bundle branch block Confirmed by Randal Buba, Kimaria Struthers ([id number]) on 05/25/2020 1:27:12 AM       Radiology  Procedures Procedures   Medications Ordered in ED Medications  fentaNYL (SUBLIMAZE) injection 50 mcg (  has no administration in time range)    ED Course  I have reviewed the triage vital signs and the nursing notes.  Pertinent labs & imaging results that were available during my care of the patient were reviewed by me and considered in my medical decision making (see chart for details).    132 Case d/w Dr. Lucia Gaskins of orthopedics, will see the patient in consult please admit to medicine, hold eliquis.    Final Clinical Impression(s) / ED Diagnoses Admit to medicine    Ravis Herne, MD 05/25/20 0139

## 2020-05-25 NOTE — Progress Notes (Addendum)
HOSPITAL MEDICINE OVERNIGHT EVENT NOTE    Nursing reports that after administration of 1 mg Dilaudid patient's blood pressure was 77/41.  Rapid response was called and a 1 L bolus of intravenous fluids was initiated.  Patient's blood pressure very quickly came up to 102/50.  Patient is exhibiting no associated change in mental status or new complaints otherwise.  Considering patient's known history of grade 2 diastolic dysfunction we will discontinue the remainder of the bolus.  Concerning future dosing of analgesia concerning patient's left hip fracture, will transition patient from 1 mg IV Dilaudid every 2 hours  As needed to using 25 to 50 mcg of fentanyl every 2 hours as needed instead.  This is overall a bit of a lower dose to minimize hypotension and is somewhat shorter acting in case there is an adverse reaction.  Amanda Emerald  MD [facility]   ADDENDUM 4:45AM 5/4  Notified by nursing that patient is having recurrent hypotension as low as 85/54.  Patient denies any associated lightheadedness or weakness.  Notably on blood work this morning hemoglobin has dropped to 9.2, down from 12.2 yesterday and 13.3 on admission.  Because of this I have asked nursing to go assess the patient's surgical site for any evidence of bleeding.  Will obtain repeat hemoglobin and hematocrit at 8 AM with a type and screen.    Amanda Sellers

## 2020-05-25 NOTE — Progress Notes (Signed)
Initial Nutrition Assessment  DOCUMENTATION CODES:   Not applicable  INTERVENTION:   Liberalize diet to REGULAR   Ensure Enlive po BID, each supplement provides 350 kcal and 20 grams of protein  MVI daily   NUTRITION DIAGNOSIS:   Increased nutrient needs related to hip fracture as evidenced by estimated needs. GOAL:   Patient will meet greater than or equal to 90% of their needs  MONITOR:   PO intake,Supplement acceptance,Weight trends,Labs,I & O's,Skin  REASON FOR ASSESSMENT:   Consult Hip fracture protocol  ASSESSMENT:   Patient with PMH significant for CHF, CKD III, follicular lymphoma s/p chemo in 2016, HLD, HTN, and pulmonary HTN. Presents this admission after mechanical fall resulting in L hip fracture.   Patient denies loss in appetite PTA. Typically consumes three meals daily that consist of B- boiled egg, toast L- pinto beans with corn bread D- meat, vegetable, and grain. Does not use supplementation. Discussed the importance of protein intake for preservation of lean body mass and to promote post op healing. RD to provide supplementation to maximize kcal and protein this admission.   Patient denies recent weight loss from UBW of 200 lb. Records indicate patient has maintained her weight of 200-205 lb over the last year.   Medications: 40 mg lasix daily, 20 mEq KCl daily Labs: reviewed    NUTRITION - FOCUSED PHYSICAL EXAM:  Flowsheet Row Most Recent Value  Orbital Region No depletion  Upper Arm Region No depletion  Thoracic and Lumbar Region Unable to assess  Buccal Region No depletion  Temple Region No depletion  Clavicle Bone Region No depletion  Clavicle and Acromion Bone Region No depletion  Scapular Bone Region Unable to assess  Dorsal Hand No depletion  Patellar Region No depletion  Anterior Thigh Region No depletion  Posterior Calf Region No depletion  Edema (RD Assessment) Mild  [bilateral lower extremities]  Hair Reviewed  Eyes Reviewed   Mouth Reviewed  Skin Reviewed  Nails Reviewed     Diet Order:   Diet Order            Diet NPO time specified  Diet effective now                 EDUCATION NEEDS:   Education needs have been addressed  Skin:  Skin Assessment: Reviewed RN Assessment  Last BM:  5/2  Height:   Ht Readings from Last 1 Encounters:  05/25/20 5\' 6"  (1.676 m)    Weight:   Wt Readings from Last 1 Encounters:  05/25/20 93.4 kg    BMI:  Body mass index is 33.23 kg/m.  Estimated Nutritional Needs:   Kcal:  1800-2000 kcal  Protein:  90-105 lb  Fluid:  >/= 1.6 L/day  Mariana Single RD, LDN Clinical Nutrition Pager listed in [place]

## 2020-05-25 NOTE — ED Notes (Signed)
Pt just transferred to room at this time via stretcher

## 2020-05-25 NOTE — Anesthesia Preprocedure Evaluation (Addendum)
Anesthesia Evaluation  Patient identified by MRN, date of birth, ID band Patient awake    Reviewed: Allergy & Precautions, NPO status , Patient's Chart, lab work & pertinent test results  Airway Mallampati: II  TM Distance: >3 FB Neck ROM: Full    Dental  (+) Edentulous Upper, Edentulous Lower   Pulmonary COPD (on 2L at night),  oxygen dependent,     + decreased breath sounds      Cardiovascular hypertension, + CAD and +CHF (on amiodarone)  + dysrhythmias (on Eliquis) Atrial Fibrillation  Rhythm:Irregular Rate:Normal     Neuro/Psych PSYCHIATRIC DISORDERS Anxiety    GI/Hepatic   Endo/Other  Hypothyroidism obesity  Renal/GU Renal InsufficiencyRenal disease  negative genitourinary   Musculoskeletal  (+) Arthritis ,   Abdominal   Peds negative pediatric ROS (+)  Hematology   Anesthesia Other Findings   Reproductive/Obstetrics                            Anesthesia Physical Anesthesia Plan  ASA: III  Anesthesia Plan: General   Post-op Pain Management:    Induction: Intravenous  PONV Risk Score and Plan: 3 and TIVA, Propofol infusion and Treatment may vary due to age or medical condition  Airway Management Planned: Oral ETT  Additional Equipment: None  Intra-op Plan:   Post-operative Plan: Extubation in OR  Informed Consent: I have reviewed the patients History and Physical, chart, labs and discussed the procedure including the risks, benefits and alternatives for the proposed anesthesia with the patient or authorized representative who has indicated his/her understanding and acceptance.       Plan Discussed with: CRNA and Anesthesiologist  Anesthesia Plan Comments: (GETA due to pulmonary HTN and last dose of Eliquis on 05/24/20. TIVA with propofol gtt. BIS. Phenylephrine gtt available. )       Anesthesia Quick Evaluation

## 2020-05-25 NOTE — ED Triage Notes (Signed)
Pt bib EMS from home where pt was getting ready for bed and had a mechanical fall. Pt denies any weakness, SOB, CP. Pt reports pain to LT hip; noted to have knot to LT hip and shortening to LLE. Pt did not hit head in fall. Pt on Eliquis.

## 2020-05-25 NOTE — Progress Notes (Addendum)
  Shalhoub aware of patient situation. New orders placed and completed. Drainage marked on dressing to continue to observe. Patient pain controlled through the night but increased in the morning. Dayshift nurse aware of decrease BP. Dilaudid discontinued d/t drop in BP.    05/25/20 2105  Assess: MEWS Score  Temp 98.8 F (37.1 C)  BP (!) 77/41  Pulse Rate 76  Resp 17  Level of Consciousness Alert  SpO2 100 %  O2 Device Nasal Cannula  O2 Flow Rate (L/min) 2 L/min  Assess: MEWS Score  MEWS Temp 0  MEWS Systolic 2  MEWS Pulse 0  MEWS RR 0  MEWS LOC 0  MEWS Score 2  MEWS Score Color Yellow  Assess: if the MEWS score is Yellow or Red  Were vital signs taken at a resting state? Yes  Focused Assessment Change from prior assessment (see assessment flowsheet) (decreased BP possibly due to dilaudid)  Early Detection of Sepsis Score *See Row Information* Low  MEWS guidelines implemented *See Row Information* Yes  Take Vital Signs  Increase Vital Sign Frequency  Yellow: Q 2hr X 2 then Q 4hr X 2, if remains yellow, continue Q 4hrs  Escalate  MEWS: Escalate Yellow: discuss with charge nurse/RN and consider discussing with provider and RRT  Notify: Charge Nurse/RN  Name of Charge Nurse/RN Notified Robbert Langlinais Rn ((Myself))  Date Charge Nurse/RN Notified 05/25/20  Time Charge Nurse/RN Notified 2105  Notify: Provider  Provider Name/Title Shalhoub  Date Provider Notified 05/25/20  Time Provider Notified 2120  Notification Type Page  Notification Reason Change in status  Provider response See new orders  Date of Provider Response 05/25/20  Time of Provider Response 2145  Notify: Rapid Response  Name of Rapid Response RN Notified Megan RN  Date Rapid Response Notified 05/25/20  Time Rapid Response Notified 2105  Document  Patient Outcome Stabilized after interventions  Progress note created (see row info) Yes

## 2020-05-25 NOTE — ED Notes (Signed)
Pt in pain 10/10. MD aware. Order given for Dilaudid. Pt remains on O2 from Fentanyl. Daughter at bedside.

## 2020-05-25 NOTE — Transfer of Care (Signed)
Immediate Anesthesia Transfer of Care Note  Patient: Amanda Sellers  Procedure(s) Performed: INTRAMEDULLARY (IM) NAIL INTERTROCHANTRIC (Left )  Patient Location: PACU  Anesthesia Type:General  Level of Consciousness: awake, alert  and oriented  Airway & Oxygen Therapy: Patient Spontanous Breathing and Patient connected to face mask oxygen  Post-op Assessment: Report given to RN and Post -op Vital signs reviewed and stable  Post vital signs: Reviewed and stable  Last Vitals:  Vitals Value Taken Time  BP 118/86 05/25/20 1617  Temp    Pulse 69 05/25/20 1620  Resp 17 05/25/20 1620  SpO2 100 % 05/25/20 1620  Vitals shown include unvalidated device data.  Last Pain:  Vitals:   05/25/20 1128  TempSrc:   PainSc: 4       Patients Stated Pain Goal: 3 (88/91/69 4503)  Complications: No complications documented.

## 2020-05-25 NOTE — Op Note (Signed)
Amanda Sellers female 87 y.o. 05/25/2020  PreOperative Diagnosis: Left intertrochanteric hip fracture  PostOperative Diagnosis: Same  PROCEDURE: Cephalomedullary nail of left intertrochanteric hip fracture  SURGEON: Melony Overly, MD  ASSISTANT: Morley Kos, OPA-C  (Present and scrubbed throughout the case, critical for assistance with exposure, retraction, placement of hardware, and closure.)     ANESTHESIA: General  FINDINGS: Displaced comminuted left intertrochanteric hip fracture  IMPLANTS: Affixes hip fracture nail 11 x 180  INDICATIONS:87 y.o. female had a fall at home and sustained the above fracture.  She was brought to the emergency department.  She was admitted to medicine.  She is indicated for surgery given her baseline ambulatory status.  She denied bilateral upper or right lower extremity pain.  She had left hip pain.  X-rays revealed a displaced intertrochanteric hip fracture.   Patient understood the risks, benefits and alternatives to surgery which include but are not limited to wound healing complications, infection, nonunion, malunion, need for further surgery as well as damage to surrounding structures. They also understood the potential for continued pain in that there were no guarantees of acceptable outcome After weighing these risks the patient opted to proceed with surgery.  PROCEDURE: Patient was identified in the preoperative holding area and the left hip was marked by myself.  The consent was signed by myself and the patient.  This identified the left lower extremity as the appropriate operative extremity and the surgeries to be performed was operative treatment of left hip fracture. They  were taken to the operating room where general endotracheal anesthesia was induced without difficulty and then was moved supine on a Hana table.  The operative leg was placed into the traction boot.  The non-operative leg was well-padded and affixed to the right  leg holder with coban.  The arm was crossed over the chest and well padded. 2 g of Ancef was given.  Surgical timeout was performed.  Using fluoroscopy appropriate AP and lateral x-rays of the left hip were obtained.  Then using traction through the Hana table was able to reduce the fracture fragments in a closed fashion.  Acceptable reduction was confirmed on AP and lateral x-rays.  Then the left hip was prepped and draped in usual sterile fashion.  A shower curtain drape was placed.  We began the surgery by placing the guidepin percutaneously at the appropriate starting point at the tip of the greater trochanter.  The appropriate starting point was confirmed on AP and lateral radiograph.  Then a 3 cm incision was made about the site of the percutaneous placement of the pin and the opening reamer with a soft tissue guide was placed into the wound down to the tip of the greater trochanter and the bone was entered with the opening reamer down to the level of lesser trochanter.  Then the openning reamer and guide pin were removed and a bead tip guidewire was placed due to comminution at the fracture site and need for stability for nail placement.  A short 11 mm nail was chosen.  This was placed without difficulty and appropriate positioning was confirmed on fluoroscopy.  Then using the blade insertion guide a second incision was made distally down the thigh to allow for placement of the cephalo-medullary screw.  The guidepin for the blade was placed up the femoral neck in the appropriate position center center in the femoral head.  Due to the fragmentation the fracture site a derotation pin was placed.  Then the reamer was used  to ream for the blade and the blade was placed without difficulty.  The length of the blade was a 100 mm.  Then the set screw was tightened.  Then the derotation screw was placed without difficulty.  Then using the intramedullary guide the distal interlock screw was placed without difficulty.   Final films were taken.  The wounds were irrigated with normal saline and the incisions were closed in a layered fashion using  2-0 Vicryl and staples.  Mepilex dressings were used.  She was then transferred to the hospital bed and awakened from anesthesia without difficulty.  Counts were correct at the end the case.  There were no complications.  POST OPERATIVE INSTRUCTIONS: Weightbearing as tolerated to left lower extremity Okay to resume anticoagulant postop day 1 Keep dressing in place and reinforce as needed  TOURNIQUET TIME:None  BLOOD LOSS:  263ml         DRAINS: none         SPECIMEN: none       COMPLICATIONS:  * No complications entered in OR log *         Disposition: PACU - hemodynamically stable.         Condition: stable

## 2020-05-25 NOTE — ED Notes (Signed)
Dilaudid given slowly. Pt tolerating well. Verbalizes pain relief.

## 2020-05-25 NOTE — Anesthesia Procedure Notes (Addendum)
Procedure Name: Intubation Date/Time: 05/25/2020 2:50 PM Performed by: Alain Marion, CRNA Pre-anesthesia Checklist: Patient identified, Emergency Drugs available, Suction available and Patient being monitored Patient Re-evaluated:Patient Re-evaluated prior to induction Oxygen Delivery Method: Circle System Utilized Preoxygenation: Pre-oxygenation with 100% oxygen Induction Type: IV induction Ventilation: Mask ventilation without difficulty Laryngoscope Size: Miller and 2 Grade View: Grade I Tube type: Oral Tube size: 7.0 mm Number of attempts: 1 Airway Equipment and Method: Stylet Placement Confirmation: ETT inserted through vocal cords under direct vision,  positive ETCO2 and breath sounds checked- equal and bilateral Secured at: 21 cm Tube secured with: Tape Dental Injury: Teeth and Oropharynx as per pre-operative assessment

## 2020-05-25 NOTE — Significant Event (Signed)
Rapid Response Event Note   Reason for Call :  Hypotensive and hypoxic after dilaudid  Initial Focused Assessment:  Drowsy arousable to voice BP in 70's      Interventions:  NS 500cc bolus Ordered Narcan to give if patient doesn't respond to fluids and oxygen [facility] 2L Plan of Care:  Continue to monitor If bolus and oxygen doesn't normalize vs will narcan Have primary RN speak to MD about changing pain meds   Event Summary:   MD Notified: primary rn Call Time: 2107 Arrival Time: 2200 End Time: 2200  Charlyne Quale, RN

## 2020-05-26 ENCOUNTER — Encounter (HOSPITAL_COMMUNITY): Payer: Self-pay | Admitting: Orthopaedic Surgery

## 2020-05-26 LAB — CBC WITH DIFFERENTIAL/PLATELET
Abs Immature Granulocytes: 0.05 10*3/uL (ref 0.00–0.07)
Basophils Absolute: 0 10*3/uL (ref 0.0–0.1)
Basophils Relative: 0 %
Eosinophils Absolute: 0 10*3/uL (ref 0.0–0.5)
Eosinophils Relative: 0 %
HCT: 28.3 % — ABNORMAL LOW (ref 36.0–46.0)
Hemoglobin: 9.2 g/dL — ABNORMAL LOW (ref 12.0–15.0)
Immature Granulocytes: 1 %
Lymphocytes Relative: 13 %
Lymphs Abs: 1 10*3/uL (ref 0.7–4.0)
MCH: 31.6 pg (ref 26.0–34.0)
MCHC: 32.5 g/dL (ref 30.0–36.0)
MCV: 97.3 fL (ref 80.0–100.0)
Monocytes Absolute: 0.6 10*3/uL (ref 0.1–1.0)
Monocytes Relative: 7 %
Neutro Abs: 6.2 10*3/uL (ref 1.7–7.7)
Neutrophils Relative %: 79 %
Platelets: 122 10*3/uL — ABNORMAL LOW (ref 150–400)
RBC: 2.91 MIL/uL — ABNORMAL LOW (ref 3.87–5.11)
RDW: 12.9 % (ref 11.5–15.5)
WBC: 7.8 10*3/uL (ref 4.0–10.5)
nRBC: 0 % (ref 0.0–0.2)

## 2020-05-26 LAB — BASIC METABOLIC PANEL
Anion gap: 8 (ref 5–15)
BUN: 24 mg/dL — ABNORMAL HIGH (ref 8–23)
CO2: 28 mmol/L (ref 22–32)
Calcium: 8 mg/dL — ABNORMAL LOW (ref 8.9–10.3)
Chloride: 104 mmol/L (ref 98–111)
Creatinine, Ser: 1.17 mg/dL — ABNORMAL HIGH (ref 0.44–1.00)
GFR, Estimated: 46 mL/min — ABNORMAL LOW (ref >60)
Glucose, Bld: 137 mg/dL — ABNORMAL HIGH (ref 70–99)
Potassium: 4.3 mmol/L (ref 3.5–5.1)
Sodium: 140 mmol/L (ref 135–145)

## 2020-05-26 LAB — HEMOGLOBIN AND HEMATOCRIT, BLOOD
HCT: 25.8 % — ABNORMAL LOW (ref 36.0–46.0)
Hemoglobin: 8.6 g/dL — ABNORMAL LOW (ref 12.0–15.0)

## 2020-05-26 MED ORDER — LACTATED RINGERS IV BOLUS
250.0000 mL | Freq: Once | INTRAVENOUS | Status: DC
  Administered 2020-05-26: 250 mL via INTRAVENOUS

## 2020-05-26 MED ORDER — MORPHINE SULFATE (PF) 2 MG/ML IV SOLN
1.0000 mg | INTRAVENOUS | Status: DC | PRN
  Administered 2020-05-26 – 2020-05-30 (×12): 1 mg via INTRAVENOUS
  Filled 2020-05-26 (×12): qty 1

## 2020-05-26 MED ORDER — POLYETHYLENE GLYCOL 3350 17 G PO PACK
17.0000 g | PACK | Freq: Every day | ORAL | Status: DC | PRN
  Administered 2020-05-30 – 2020-06-02 (×2): 17 g via ORAL
  Filled 2020-05-26 (×2): qty 1

## 2020-05-26 MED ORDER — APIXABAN 5 MG PO TABS
5.0000 mg | ORAL_TABLET | Freq: Two times a day (BID) | ORAL | Status: DC
  Administered 2020-05-26 (×2): 5 mg via ORAL
  Filled 2020-05-26 (×2): qty 1

## 2020-05-26 MED ORDER — HYDROCODONE-ACETAMINOPHEN 5-325 MG PO TABS
1.0000 | ORAL_TABLET | Freq: Four times a day (QID) | ORAL | Status: DC
  Administered 2020-05-26 – 2020-05-30 (×17): 1 via ORAL
  Filled 2020-05-26 (×18): qty 1

## 2020-05-26 MED ORDER — SODIUM CHLORIDE 0.9 % IV SOLN: INTRAVENOUS | Status: AC

## 2020-05-26 NOTE — Anesthesia Postprocedure Evaluation (Signed)
Anesthesia Post Note  Patient: Amanda Sellers  Procedure(s) Performed: INTRAMEDULLARY (IM) NAIL INTERTROCHANTRIC (Left )     Patient location during evaluation: PACU Anesthesia Type: General Level of consciousness: sedated Pain management: pain level controlled Vital Signs Assessment: post-procedure vital signs reviewed and stable Respiratory status: spontaneous breathing and respiratory function stable Cardiovascular status: stable Postop Assessment: no apparent nausea or vomiting Anesthetic complications: no   No complications documented.  Last Vitals:  Vitals:   05/26/20 0307 05/26/20 0631  BP: (!) 85/47 (!) 115/49  Pulse: 70 72  Resp: 18 17  Temp: 36.8 C 36.7 C  SpO2: 98% 99%    Last Pain:  Vitals:   05/26/20 0755  TempSrc:   PainSc: 3                  Candra R Ludella Pranger

## 2020-05-26 NOTE — Plan of Care (Signed)
  Problem: Activity: Goal: Risk for activity intolerance will decrease Outcome: Progressing   Problem: Pain Managment: Goal: General experience of comfort will improve Outcome: Progressing   

## 2020-05-26 NOTE — TOC CAGE-AID Note (Signed)
Transition of Care [facility]) - CAGE-AID Screening   Patient Details  Name: Amanda Sellers MRN: [id number] Date of Birth: [birth year]  Transition of Care [facility]) CM/SW Contact:    Bethann Berkshire, [place] Phone Number: 05/26/2020, 10:15 AM   Clinical Narrative:  CSW completed CAGE-AID with score of 0. Pt states she has not used alcohol or other substances her whole life.   CAGE-AID Screening:    Have You Ever Felt You Ought to Cut Down on Your Drinking or Drug Use?: No Have People Annoyed You By Critizing Your Drinking Or Drug Use?: No Have You Felt Bad Or Guilty About Your Drinking Or Drug Use?: No Have You Ever Had a Drink or Used Drugs First Thing In The Morning to Steady Your Nerves or to Get Rid of a Hangover?: No CAGE-AID Score: 0  Substance Abuse Education Offered: No

## 2020-05-26 NOTE — Progress Notes (Signed)
     Amanda Sellers is a 87 y.o. female   Orthopaedic diagnosis:Left intertrochanteric hip fracture status post cephalo-medullary nail on 05/25/2020  Subjective: Patient complains of pain in the left hip.  She states she is having difficulty moving her leg due to the discomfort.  Physical therapy has not been by to see her yet.  It was noted by nursing that she had some shadowing on her dressing.  Family is at bedside.  Denies numbness or tingling in her legs.  She denies shortness of breath.  Objectyive: Vitals:   05/26/20 0307 05/26/20 0631  BP: (!) 85/47 (!) 115/49  Pulse: 70 72  Resp: 18 17  Temp: 98.3 F (36.8 C) 98.1 F (36.7 C)  SpO2: 98% 99%     Exam: Awake and alert Respirations even and unlabored No acute distress  Left hip dressing was some shadowing.  Dressing is in place.  Tender to palpation at the hip.  She tolerates gentle log roll of the hip.  No tenderness about the calf, ankle or foot.   Hemoglobin: 8.6   Assessment: Postop day 1 status post cephalo-medullary nail of her left hip fracture on 05/25/2020   Plan: Overall she is doing well.  There is some shadowing on the dressing and this can be reinforced as needed.  We will likely plan to change the dressing before discharge. She is weightbearing as tolerated on the left lower extremity and will work with physical therapy for mobilization and disposition purposes. Type and screen has been ordered by hospitalist.  Transfuse per hospitalist Continue perioperative antibiotics until complete 3 doses. Okay to restart her anticoagulation once hemoglobin stabilizes.  This to be done at the discretion of the hospitalist. Post discharge patient will follow up in 2 weeks for x-rays of the left hip and suture removal if appropriate   Radene Journey, MD

## 2020-05-26 NOTE — Progress Notes (Signed)
Physical Therapy Evaluation Patient Details Name: Amanda Sellers MRN: [id number] DOB: May 18, 1932 Today's Date: 05/26/2020   History of Present Illness  87 yo female with onset of fall at home, was brought to hosp with proximal femoral intertrochanteric fracture.  After ORIF was WBAT.  PMHx:  a-fib, Eliquis, CHF, HTN, fall  Clinical Impression  Pt was seen for mobility by getting up to side of bed, and noted her difficulty with using either leg with force to stand. Instead, she is requiring total assist to stand, and will be reluctant to go to rehab before home.  Per daughter, pt is going to have only one person at home at a time.  This will make pt unsafe to stay there, but may elect to do this anyway.  Follow for acute PT and see how pt progresses.  Focus on her sit to stand performance and try to reduce assist to one person.    Follow Up Recommendations CIR    Equipment Recommendations  None recommended by PT    Recommendations for Other Services Rehab consult     Precautions / Restrictions Precautions Precautions: Fall Precaution Comments: RLE pain prior to fall Restrictions Weight Bearing Restrictions: Yes LLE Weight Bearing: Weight bearing as tolerated      Mobility  Bed Mobility Overal bed mobility: Needs Assistance Bed Mobility: Rolling;Sidelying to Sit;Sit to Sidelying Rolling: Mod assist Sidelying to sit: Mod assist;Max assist     Sit to sidelying: Max assist      Transfers Overall transfer level: Needs assistance Equipment used: Rolling walker (2 wheeled);1 person hand held assist Transfers: Sit to/from Stand Sit to Stand: Total assist         General transfer comment: pt cannot generate force with legs  Ambulation/Gait             General Gait Details: unable  Stairs            Wheelchair Mobility    Modified Rankin (Stroke Patients Only)       Balance Overall balance assessment: History of Falls;Needs assistance Sitting-balance  support: Feet supported;Bilateral upper extremity supported Sitting balance-Leahy Scale: Poor                                       Pertinent Vitals/Pain Pain Assessment: Faces Faces Pain Scale: Hurts even more Pain Location: L hip to get to side of bed Pain Descriptors / Indicators: Operative site guarding;Grimacing Pain Intervention(s): Limited activity within patient's tolerance;Monitored during session;Premedicated before session;Repositioned    Home Living Family/patient expects to be discharged to:: Skilled nursing facility Living Arrangements: Children               Additional Comments: son is not home 24/7    Prior Function Level of Independence: Independent with assistive device(s)         Comments: 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     Hand Dominance   Dominant Hand: Right    Extremity/Trunk Assessment   Upper Extremity Assessment Upper Extremity Assessment: Overall WFL for tasks assessed    Lower Extremity Assessment Lower Extremity Assessment: LLE deficits/detail LLE Deficits / Details: L hip femoral fracture with IM nailing LLE Coordination: decreased gross motor    Cervical / Trunk Assessment Cervical / Trunk Assessment: Kyphotic  Communication   Communication: No difficulties  Cognition Arousal/Alertness: Awake/alert Behavior During Therapy: WFL for tasks  assessed/performed Overall Cognitive Status: Within Functional Limits for tasks assessed                                 General Comments: able to discuss PLOF with PT      General Comments General comments (skin integrity, edema, etc.): unable to sit without help on side of bed, requires cues to remain forward and control balance    Exercises     Assessment/Plan    PT Assessment Patient needs continued PT services  PT Problem List Decreased strength;Decreased activity tolerance;Decreased balance;Decreased  mobility;Decreased coordination;Decreased knowledge of use of DME;Decreased skin integrity;Pain;Obesity       PT Treatment Interventions DME instruction;Gait training;Stair training;Functional mobility training;Therapeutic activities;Therapeutic exercise;Balance training;Neuromuscular re-education;Patient/family education    PT Goals (Current goals can be found in the Care Plan section)  Acute Rehab PT Goals Patient Stated Goal: to go straight home PT Goal Formulation: With patient/family Time For Goal Achievement: 06/09/20 Potential to Achieve Goals: Fair    Frequency Min 3X/week   Barriers to discharge Inaccessible home environment;Decreased caregiver support has no consistent help at home    Co-evaluation               AM-PAC PT "6 Clicks" Mobility  Outcome Measure Help needed turning from your back to your side while in a flat bed without using bedrails?: A Lot Help needed moving from lying on your back to sitting on the side of a flat bed without using bedrails?: A Lot Help needed moving to and from a bed to a chair (including a wheelchair)?: A Lot Help needed standing up from a chair using your arms (e.g., wheelchair or bedside chair)?: A Lot Help needed to walk in hospital room?: Total Help needed climbing 3-5 steps with a railing? : Total 6 Click Score: 10    End of Session Equipment Utilized During Treatment: Gait belt;Oxygen Activity Tolerance: Patient tolerated treatment well;Patient limited by fatigue;Treatment limited secondary to medical complications (Comment) Patient left: in bed;with call bell/phone within reach;with bed alarm set;with family/visitor present Nurse Communication: Mobility status PT Visit Diagnosis: Unsteadiness on feet (R26.81);Muscle weakness (generalized) (M62.81);Difficulty in walking, not elsewhere classified (R26.2)    Time: 4163-8453 PT Time Calculation (min) (ACUTE ONLY): 29 min   Charges:   PT Evaluation $PT Eval Moderate  Complexity: 1 Mod PT Treatments $Therapeutic Activity: 8-22 mins       Ramond Dial 05/26/2020, 3:53 PM Mee Hives, PT MS Acute Rehab Dept. Number: [place] [facility] [place]

## 2020-05-26 NOTE — Plan of Care (Signed)

## 2020-05-26 NOTE — TOC Progression Note (Signed)
Transition of Care [facility]) - Progression Note    Patient Details  Name: BRETA DEMEDEIROS MRN: [id number] Date of Birth: 1932-09-13  Transition of Care [facility]) CM/SW Contact  Milinda Antis, LCSWA Phone Number: 05/26/2020, 11:30 AM  Clinical Narrative:     CSW following patient for any d/c planning needs once medically stable.  Lind Covert, MSW, LCSWA       Expected Discharge Plan and Services                                                 Social Determinants of Health (SDOH) Interventions    Readmission Risk Interventions No flowsheet data found.

## 2020-05-26 NOTE — Progress Notes (Addendum)
PROGRESS NOTE  Amanda Sellers  DOB: 17-Feb-1932  PCP: Tammi Sou, MD XBJ:[id number]  DOA: 05/25/2020  LOS: 1 day   Chief Complaint  Patient presents with  . Fall   Brief narrative: Amanda Sellers is a 87 y.o. female with PMH significant for A.Fib on Eliquis, dCHF, HTN. Patient was brought to the ED from home on 05/24/2020 for fall from losing her balance while she was getting ready for bed.  She had severe pain to the left hip following fall.  In the ED, afebrile, blood pressure elevated up to 200/105 Hip x-ray showed medially angulated reverse oblique intertrochanteric fracture of the proximal left femur. Skeletal survey negative otherwise ED physician discussed the case with orthopedist Dr. Lucia Gaskins.  Patient was made NPO.  Eliquis was held. Patient was admitted to hospitalist service. 5/3, patient underwent cephalomedullary nail of left intertrochanteric hip fracture  Subjective: Patient was seen and examined this morning.  Eventful night.  Patient had uncontrolled pain.  She got a dose of Dilaudid 1 mg IV after which her pain was controlled for about 20 minutes but her blood pressure tanked and her oxygen saturation dropped as well.  She was given fluid bolus  Feels sleepy this morning.  Daughter at bedside.   Labs this morning with creatinine elevated.  Assessment/Plan: Intertrochanteric left femur fracture -After fall from losing her balance -5/3, patient underwent cephalomedullary nail of left intertrochanteric hip fracture -I will treat her pain regimen today.  We will use scheduled oral Norco and as needed IV morphine.  I would avoid IV Dilaudid or IV fentanyl at this time.  As needed MiraLAX ordered -Continue Eliquis  Acute blood loss anemia -Hemoglobin normal at baseline.  Dropped down to 9.2 prior to surgery (probably due to blood loss from fracture) and 8.6 after the surgery, probably due to intra-op blood loss. -Continue to monitor.   -Need to hold Eliquis if  continues to drop.   Recent Labs    09/30/19 0916 11/24/19 1433 02/23/20 0928 05/25/20 0028 05/25/20 0200 05/26/20 0158 05/26/20 0824  HGB 13.6   < > 14.0 13.3 12.2 9.2* 8.6*  MCV 93.6   < > 92.7 96.9  --  97.3  --   VITAMINB12 637  --   --   --   --   --   --    < > = values in this interval not displayed.   AKI on CKD stage IIIa -Creatinine elevated this morning likely because of prolonged hypotension yesterday. -Start on normal saline at 50 mill per hour for next 24 hours -Repeat creatinine tomorrow Recent Labs    05/29/19 1031 09/30/19 0916 02/23/20 0928 05/25/20 0154 05/25/20 0200 05/26/20 0158  BUN 18 16 18 19 22  24*  CREATININE 1.16* 0.92 0.90 1.05* 0.90 1.17*   History of systolic CHF  Essential hypertension  -Home meds include amlodipine 2.5 mg daily, Lasix 40 mg daily, Entresto 24/26 mg twice daily -Echo from 04/2019 had shown a low EF of 35 to 40% which improved to 55 to 60% on repeat echo on 09/2019. -Blood pressure was low last night and this morning.   -Continue Entresto.  Keep amlodipine and Lasix on hold.  Paroxysmal A. fib -Cont amiodarone 200 mg daily -Eliquis resumed.  Hyperlipidemia -Continue Crestor 20 mg daily  Hypothyroidism -Continue Synthroid 100 mcg daily  Anxiety -Klonopin 0.25 mg twice daily as needed   Chronic iron deficiency anemia -Continue iron supplement   Mobility: Encourage ambulation postprocedure.  Needs  PT Code Status:   Code Status: Full Code  Nutritional status: Body mass index is 33.23 kg/m. Nutrition Problem: Increased nutrient needs Etiology: hip fracture Signs/Symptoms: estimated needs Diet Order            Diet regular Room service appropriate? Yes; Fluid consistency: Thin  Diet effective now                 DVT prophylaxis: SCDs Start: 05/25/20 1721 SCDs Start: 05/25/20 0248   Antimicrobials:  None Fluid: None Consultants: Orthopedics Family Communication:  Daughter at bedside  Status is:  Inpatient  Remains inpatient appropriate because: Postop day 1  Dispo: The patient is from: Home              Anticipated d/c is to: Home versus SNF, pending PT eval              Patient currently is not medically stable to d/c.   Difficult to place patient No     Infusions:  . sodium chloride 50 mL/hr at 05/26/20 1016    Scheduled Meds: . amiodarone  200 mg Oral Daily  . apixaban  5 mg Oral BID  . docusate sodium  100 mg Oral BID  . ferrous sulfate  325 mg Oral BID WC  . HYDROcodone-acetaminophen  1 tablet Oral Q6H  . levothyroxine  100 mcg Oral Q0600  . potassium chloride SA  20 mEq Oral Daily  . rosuvastatin  20 mg Oral Daily  . sacubitril-valsartan  1 tablet Oral BID    Antimicrobials: Anti-infectives (From admission, onward)   Start     Dose/Rate Route Frequency Ordered Stop   05/25/20 2100  ceFAZolin (ANCEF) 1 g in sodium chloride 0.9 % 100 mL IVPB        1 g 200 mL/hr over 30 Minutes Intravenous Every 6 hours 05/25/20 1720 05/26/20 0914   05/25/20 1345  ceFAZolin (ANCEF) IVPB 2g/100 mL premix        2 g 200 mL/hr over 30 Minutes Intravenous On call to O.R. 05/25/20 1335 05/25/20 1454      PRN meds: acetaminophen, clonazePAM, metoCLOPramide **OR** metoCLOPramide (REGLAN) injection, morphine injection, naloxone, ondansetron **OR** ondansetron (ZOFRAN) IV, polyethylene glycol   Objective: Vitals:   05/26/20 0631 05/26/20 1533  BP: (!) 115/49 (!) 121/47  Pulse: 72 69  Resp: 17 16  Temp: 98.1 F (36.7 C) 97.6 F (36.4 C)  SpO2: 99% 91%    Intake/Output Summary (Last 24 hours) at 05/26/2020 1603 Last data filed at 05/26/2020 0407 Gross per 24 hour  Intake 800 ml  Output --  Net 800 ml   Filed Weights   05/25/20 0030  Weight: 93.4 kg   Weight change:  Body mass index is 33.23 kg/m.   Physical Exam: General exam: Pleasant, elderly Caucasian female.  Lying on bed.  Tired because of no sleep last night Skin: No rashes, lesions or ulcers. HEENT:  Atraumatic, normocephalic, no obvious bleeding Lungs: Clear to auscultation bilaterally CVS: Regular rate and rhythm, no murmur GI/Abd soft, nontender, nondistended, bowel sound present CNS: Alert, awake, oriented x3 Psychiatry: Mood appropriate Extremities: No pedal edema, no calf tenderness.   Data Review: I have personally reviewed the laboratory data and studies available.  Recent Labs  Lab 05/25/20 0028 05/25/20 0200 05/26/20 0158 05/26/20 0824  WBC 6.7  --  7.8  --   NEUTROABS 5.5  --  6.2  --   HGB 13.3 12.2 9.2* 8.6*  HCT 41.2 36.0 28.3* 25.8*  MCV 96.9  --  97.3  --   PLT 121*  --  122*  --    Recent Labs  Lab 05/25/20 0154 05/25/20 0200 05/26/20 0158  NA 140 141 140  K 3.9 4.3 4.3  CL 102 103 104  CO2 28  --  28  GLUCOSE 178* 180* 137*  BUN 19 22 24*  CREATININE 1.05* 0.90 1.17*  CALCIUM 9.1  --  8.0*    F/u labs ordered Unresulted Labs (From admission, onward)          Start     Ordered   05/26/20 0500  CBC with Differential/Platelet  Daily,   R      05/25/20 1116   05/26/20 1505  Basic metabolic panel  Daily,   R      05/25/20 1116          Signed, Terrilee Croak, MD [facility] 05/26/2020

## 2020-05-27 LAB — CBC WITH DIFFERENTIAL/PLATELET
Abs Immature Granulocytes: 0.02 10*3/uL (ref 0.00–0.07)
Basophils Absolute: 0 10*3/uL (ref 0.0–0.1)
Basophils Relative: 1 %
Eosinophils Absolute: 0.1 10*3/uL (ref 0.0–0.5)
Eosinophils Relative: 2 %
HCT: 22.6 % — ABNORMAL LOW (ref 36.0–46.0)
Hemoglobin: 7.4 g/dL — ABNORMAL LOW (ref 12.0–15.0)
Immature Granulocytes: 1 %
Lymphocytes Relative: 19 %
Lymphs Abs: 0.8 10*3/uL (ref 0.7–4.0)
MCH: 31.9 pg (ref 26.0–34.0)
MCHC: 32.7 g/dL (ref 30.0–36.0)
MCV: 97.4 fL (ref 80.0–100.0)
Monocytes Absolute: 0.4 10*3/uL (ref 0.1–1.0)
Monocytes Relative: 10 %
Neutro Abs: 2.9 10*3/uL (ref 1.7–7.7)
Neutrophils Relative %: 67 %
Platelets: UNDETERMINED 10*3/uL (ref 150–400)
RBC: 2.32 MIL/uL — ABNORMAL LOW (ref 3.87–5.11)
RDW: 13.4 % (ref 11.5–15.5)
WBC: 4.2 10*3/uL (ref 4.0–10.5)
nRBC: 0 % (ref 0.0–0.2)

## 2020-05-27 LAB — BASIC METABOLIC PANEL
Anion gap: 6 (ref 5–15)
BUN: 20 mg/dL (ref 8–23)
CO2: 30 mmol/L (ref 22–32)
Calcium: 7.8 mg/dL — ABNORMAL LOW (ref 8.9–10.3)
Chloride: 102 mmol/L (ref 98–111)
Creatinine, Ser: 0.99 mg/dL (ref 0.44–1.00)
GFR, Estimated: 56 mL/min — ABNORMAL LOW (ref >60)
Glucose, Bld: 125 mg/dL — ABNORMAL HIGH (ref 70–99)
Potassium: 3.8 mmol/L (ref 3.5–5.1)
Sodium: 138 mmol/L (ref 135–145)

## 2020-05-27 LAB — PREPARE RBC (CROSSMATCH)

## 2020-05-27 MED ORDER — APIXABAN 5 MG PO TABS: 5.0000 mg | ORAL_TABLET | Freq: Two times a day (BID) | ORAL | Status: DC

## 2020-05-27 MED ORDER — ADULT MULTIVITAMIN W/MINERALS CH
1.0000 | ORAL_TABLET | Freq: Every day | ORAL | Status: DC
  Administered 2020-05-27 – 2020-06-03 (×8): 1 via ORAL
  Filled 2020-05-27 (×7): qty 1

## 2020-05-27 MED ORDER — ENSURE ENLIVE PO LIQD
237.0000 mL | Freq: Two times a day (BID) | ORAL | Status: DC
  Administered 2020-05-27 – 2020-06-02 (×11): 237 mL via ORAL

## 2020-05-27 MED ORDER — SODIUM CHLORIDE 0.9% IV SOLUTION: Freq: Once | INTRAVENOUS | Status: DC

## 2020-05-27 NOTE — Progress Notes (Signed)
Rehab Admissions Coordinator Note:  Patient was screened by Cleatrice Burke for appropriateness for an Inpatient Acute Rehab Consult per PT recs. It is unlikely that [employer] will approve a CIR admit for this diagnosis. I would recommend other rehab venue options to be pursued.  Cleatrice Burke RN MSN 05/27/2020, 9:32 AM  I can be reached at [phone].

## 2020-05-27 NOTE — Plan of Care (Signed)

## 2020-05-27 NOTE — Progress Notes (Signed)
Patient ID: Amanda Sellers, female   DOB: [birth year], 87 y.o.   MRN: [id number]  PROGRESS NOTE    Amanda Sellers  QJF:[id number] DOB: [birth year] DOA: 05/25/2020 PCP: Tammi Sou, MD    Brief Narrative:  Amanda Sellers is a 87 y.o. female with PMH significant forA.Fib on Eliquis, dCHF, HTN. Patient was brought to the ED from home on 05/24/2020 for fall from losing her balance while she was getting ready for bed.  She had severe pain to the left hip following fall.  In the ED, afebrile, blood pressure elevated up to 200/105 Hip x-ray showed medially angulated reverse oblique intertrochanteric fracture of the proximal left femur. Skeletal survey negative otherwise ED physician discussed the case with orthopedist Dr. Lucia Gaskins.  Patient was made NPO.  Eliquis was held. Patient was admitted to hospitalist service. 5/3, patient underwent cephalomedullary nail of left intertrochanteric hip fracture   Assessment & Plan:   Principal Problem:   Fracture, proximal femur, left, closed, initial encounter ([place]) Active Problems:   Chronic diastolic CHF (congestive heart failure) (HCC)   Hypertension   Paroxysmal atrial fibrillation ([place]); CHA2DS2Vasc 6 - Eliquis; Amio 100 mg daily   Anticoagulation adequate, Eliquis   LBBB (left bundle branch block)  Intertrochanteric left femur fracture -After fall from losing her balance -5/3, patient underwent cephalomedullary nail of left intertrochanteric hip fracture -I will treat her pain regimen today.  We will use scheduled oral Norco and as needed IV morphine.  I would avoid IV Dilaudid or IV fentanyl at this time.  As needed MiraLAX ordered -Continue Eliquis once Hgb is stable  Acute blood loss anemia -Hemoglobin normal at baseline.  Dropped down to 9.2 prior to surgery (probably due to blood loss from fracture) and 8.6 after the surgery, probably due to intra-op blood loss. -Continue to monitor--further dropped today to 7.7.   -Given  comorbid conditions we will plan to transfuse 1 unit and hold Eliquis for now.  AKI on CKD stage IIIa -Creatinine elevated this morning likely because of prolonged hypotension yesterday. -Start on normal saline at 50 mill per hour for next 24 hours -Creatinine down to 0.99 today  History of systolic CHF  Essential hypertension  -Home meds include amlodipine 2.5 mg daily, Lasix 40 mg daily, Entresto 24/26 mg twice daily -Echo from 04/2019 had shown a low EF of 35 to 40% which improved to 55 to 60% on repeat echo on 09/2019. -Blood pressure was low last night and this morning.   -Continue Entresto.  -We will resume amlodipine and Lasix  Paroxysmal A. fib -Cont amiodarone 200 mg daily -Eliquis on hold x24 hours  Hyperlipidemia -Continue Crestor 20 mg daily  Hypothyroidism -Continue Synthroid 100 mcg daily  Anxiety -Klonopin 0.25 mg twice daily as needed   Chronic iron deficiency anemia -Continue iron supplement   DVT prophylaxis: SCD/Compression stockings and SL:HTDSKAJ Code Status: Full code  Family Communication: Daughter at bedside Disposition Plan: CIR pending continued improvement   Consultants:   Ortho  Procedures:  Cephalomedullary nail of left intertrochanteric hip fracture  Antimicrobials: Anti-infectives (From admission, onward)   Start     Dose/Rate Route Frequency Ordered Stop   05/25/20 2100  ceFAZolin (ANCEF) 1 g in sodium chloride 0.9 % 100 mL IVPB        1 g 200 mL/hr over 30 Minutes Intravenous Every 6 hours 05/25/20 1720 05/26/20 0914   05/25/20 1345  ceFAZolin (ANCEF) IVPB 2g/100 mL premix  2 g 200 mL/hr over 30 Minutes Intravenous On call to O.R. 05/25/20 1335 05/25/20 1454       Subjective: Reports ongoing pain, also has weakness Her daughter reports that she has been grabbing at her right lower extremity as well.  Objective: Vitals:   05/26/20 2143 05/27/20 0456 05/27/20 0953 05/27/20 1417  BP: (!) 126/57 (!) 155/56 117/84  (!) 124/42  Pulse: 70  70 72  Resp: 16 17 17 17   Temp: 98.7 F (37.1 C) 98.2 F (36.8 C) 98.8 F (37.1 C) 98.8 F (37.1 C)  TempSrc: Oral Oral Oral Oral  SpO2: 97% 100% 97% 99%  Weight:      Height:        Intake/Output Summary (Last 24 hours) at 05/27/2020 1612 Last data filed at 05/26/2020 2130 Gross per 24 hour  Intake --  Output 500 ml  Net -500 ml   Filed Weights   05/25/20 0030  Weight: 93.4 kg    Examination:  General exam: Appears calm and comfortable  Respiratory system: Clear to auscultation. Respiratory effort normal. Cardiovascular system: S1 & S2 heard, RRR.  Gastrointestinal system: Abdomen is nondistended, soft and nontender.  Central nervous system: Alert and oriented. No focal neurological deficits. Extremities: Symmetric  Skin: No rashes Psychiatry: Judgement and insight appear normal. Mood & affect appropriate.     Data Reviewed: I have personally reviewed following labs and imaging studies  CBC: Recent Labs  Lab 05/25/20 0028 05/25/20 0200 05/26/20 0158 05/26/20 0824 05/27/20 0324  WBC 6.7  --  7.8  --  4.2  NEUTROABS 5.5  --  6.2  --  2.9  HGB 13.3 12.2 9.2* 8.6* 7.4*  HCT 41.2 36.0 28.3* 25.8* 22.6*  MCV 96.9  --  97.3  --  97.4  PLT 121*  --  122*  --  PLATELET CLUMPS NOTED ON SMEAR, UNABLE TO ESTIMATE   Basic Metabolic Panel: Recent Labs  Lab 05/25/20 0154 05/25/20 0200 05/26/20 0158 05/27/20 0324  NA 140 141 140 138  K 3.9 4.3 4.3 3.8  CL 102 103 104 102  CO2 28  --  28 30  GLUCOSE 178* 180* 137* 125*  BUN 19 22 24* 20  CREATININE 1.05* 0.90 1.17* 0.99  CALCIUM 9.1  --  8.0* 7.8*   GFR: Estimated Creatinine Clearance: 47.8 mL/min (by C-G formula based on SCr of 0.99 mg/dL).   Recent Results (from the past 240 hour(s))  Resp Panel by RT-PCR (Flu A&B, Covid) Nasopharyngeal Swab     Status: None   Collection Time: 05/25/20 12:29 AM   Specimen: Nasopharyngeal Swab; Nasopharyngeal(NP) swabs in vial transport medium   Result Value Ref Range Status   SARS Coronavirus 2 by RT PCR NEGATIVE NEGATIVE Final    Comment: (NOTE) SARS-CoV-2 target nucleic acids are NOT DETECTED.  The SARS-CoV-2 RNA is generally detectable in upper respiratory specimens during the acute phase of infection. The lowest concentration of SARS-CoV-2 viral copies this assay can detect is 138 copies/mL. A negative result does not preclude SARS-Cov-2 infection and should not be used as the sole basis for treatment or other patient management decisions. A negative result may occur with  improper specimen collection/handling, submission of specimen other than nasopharyngeal swab, presence of viral mutation(s) within the areas targeted by this assay, and inadequate number of viral copies(<138 copies/mL). A negative result must be combined with clinical observations, patient history, and epidemiological information. The expected result is Negative.  Fact Sheet for Patients:  EntrepreneurPulse.com.au  Fact Sheet for Healthcare Providers:  IncredibleEmployment.be  This test is no t yet approved or cleared by the [place] FDA and  has been authorized for detection and/or diagnosis of SARS-CoV-2 by FDA under an Emergency Use Authorization (EUA). This EUA will remain  in effect (meaning this test can be used) for the duration of the COVID-19 declaration under Section 564(b)(1) of the Act, 21 U.S.C.section 360bbb-3(b)(1), unless the authorization is terminated  or revoked sooner.       Influenza A by PCR NEGATIVE NEGATIVE Final   Influenza B by PCR NEGATIVE NEGATIVE Final    Comment: (NOTE) The Xpert Xpress SARS-CoV-2/FLU/RSV plus assay is intended as an aid in the diagnosis of influenza from Nasopharyngeal swab specimens and should not be used as a sole basis for treatment. Nasal washings and aspirates are unacceptable for Xpert Xpress SARS-CoV-2/FLU/RSV testing.  Fact Sheet for  Patients: EntrepreneurPulse.com.au  Fact Sheet for Healthcare Providers: IncredibleEmployment.be  This test is not yet approved or cleared by the [place] FDA and has been authorized for detection and/or diagnosis of SARS-CoV-2 by FDA under an Emergency Use Authorization (EUA). This EUA will remain in effect (meaning this test can be used) for the duration of the COVID-19 declaration under Section 564(b)(1) of the Act, 21 U.S.C. section 360bbb-3(b)(1), unless the authorization is terminated or revoked.  Performed at [facility], [place] [address], [place], [place] [address]   Surgical pcr screen     Status: None   Collection Time: 05/25/20  9:09 AM   Specimen: Nasal Mucosa; Nasal Swab  Result Value Ref Range Status   MRSA, PCR NEGATIVE NEGATIVE Final   Staphylococcus aureus NEGATIVE NEGATIVE Final    Comment: (NOTE) The Xpert SA Assay (FDA approved for NASAL specimens in patients 87 years of age and older), is one component of a comprehensive surveillance program. It is not intended to diagnose infection nor to guide or monitor treatment. Performed at [facility], [place] [address], [place], [place] [address]       Radiology Studies: No results found.   Scheduled Meds: . sodium chloride   Intravenous Once  . amiodarone  200 mg Oral Daily  . docusate sodium  100 mg Oral BID  . feeding supplement  237 mL Oral BID BM  . ferrous sulfate  325 mg Oral BID WC  . HYDROcodone-acetaminophen  1 tablet Oral Q6H  . levothyroxine  100 mcg Oral Q0600  . multivitamin with minerals  1 tablet Oral Daily  . potassium chloride SA  20 mEq Oral Daily  . rosuvastatin  20 mg Oral Daily  . sacubitril-valsartan  1 tablet Oral BID   Continuous Infusions:   LOS: 2 days    Donnamae Jude, MD 05/27/2020 4:12 PM [phone] [facility] If 7PM-7AM, please contact night-coverage 05/27/2020, 4:12 PM

## 2020-05-27 NOTE — Progress Notes (Addendum)
Tx performed and charted by SPTA under direct guidance and supervision of PTA at all times. This session was performed under the supervision of a licensed clinician.   Charting reviewed for accuracy and depicts tx performed and services provided.  [facility] Pager [phone] Office [phone]     Physical Therapy Treatment Patient Details Name: Amanda Sellers MRN: [id number] DOB: 18-Nov-1932 Today's Date: 05/27/2020    History of Present Illness 87 yo female with onset of fall at home, was brought to hosp with proximal femoral intertrochanteric fracture.  After ORIF was WBAT.  PMHx:  a-fib, Eliquis, CHF, HTN, fall    PT Comments    Pt received lying supine in bed and asking for morphine prior to session. She is slowly progressing towards goals and is requiring total A +2 in the sara stedy to stand. Pt was left in chair with maximove pad. Pt complains of R knee pain during session. Daughter states she was painful and unable to lift R hip prior to fall. Informed nurse and MD. Pt and family are adamant about d/c home. Based on CIR denial will update recommendations to SNF. Family states "she will not go to a nursing home". She would benefit from further PT to increase strength and functional independence.   Follow Up Recommendations  SNF;Supervision/Assistance - 24 hour (Family and Pt likely to refuse SNF, if so will require HHPT and [facility].)     Equipment Recommendations       Recommendations for Other Services       Precautions / Restrictions Precautions Precautions: Fall Restrictions Weight Bearing Restrictions: Yes LLE Weight Bearing: Weight bearing as tolerated    Mobility  Bed Mobility Overal bed mobility: Needs Assistance Bed Mobility: Supine to Sit Rolling: Max assist;+2 for physical assistance         General bed mobility comments: Requires Max A +2 to get there due to pain.    Transfers Overall transfer level: Needs  assistance Equipment used:  Denna Haggard) Transfers: Sit to/from Stand Sit to Stand: Total assist;+2 physical assistance         General transfer comment: Pt unable to power into standing. Requires total assist +2. When standing she leans to the right significantly while doubling over due to pain.  Ambulation/Gait             General Gait Details: Pt unable. Moved to chair using stedy   Stairs             Wheelchair Mobility    Modified Rankin (Stroke Patients Only)       Balance Overall balance assessment: History of Falls;Needs assistance Sitting-balance support: Feet supported;Bilateral upper extremity supported Sitting balance-Leahy Scale: Poor                                      Cognition Arousal/Alertness: Awake/alert Behavior During Therapy: WFL for tasks assessed/performed Overall Cognitive Status: Within Functional Limits for tasks assessed                                 General Comments: Pt stating "I think I might die" but then also states "I gotta get out of this bed"      Exercises General Exercises - Upper Extremity Shoulder Flexion: AROM;Both;10 reps;Seated Shoulder ABduction: AROM;Both;10 reps;Seated Elbow Flexion: AROM;Both;Seated;10 reps Elbow Extension:  AROM;Both;10 reps;Seated General Exercises - Lower Extremity Ankle Circles/Pumps: AROM;20 reps;Seated Heel Slides: AAROM;5 reps;Supine;10 reps;Left;Right (Left: 5; Right: 10) Hip ABduction/ADduction: AAROM;Both;5 reps;Left;Right;10 reps (Left: 5; Right: 10)    General Comments        Pertinent Vitals/Pain Pain Assessment: 0-10 Pain Score: 10-Worst pain ever Pain Location: L hip Pain Descriptors / Indicators: Moaning;Grimacing;Guarding;Other (Comment) ([place]) Pain Intervention(s): Monitored during session;Repositioned    Home Living                      Prior Function            PT Goals (current goals can now be found in the  care plan section)      Frequency    Min 3X/week      PT Plan Discharge plan needs to be updated    Co-evaluation              AM-PAC PT "6 Clicks" Mobility   Outcome Measure  Help needed turning from your back to your side while in a flat bed without using bedrails?: A Lot Help needed moving from lying on your back to sitting on the side of a flat bed without using bedrails?: A Lot Help needed moving to and from a bed to a chair (including a wheelchair)?: A Lot Help needed standing up from a chair using your arms (e.g., wheelchair or bedside chair)?: Total Help needed to walk in hospital room?: Total Help needed climbing 3-5 steps with a railing? : Total 6 Click Score: 9    End of Session Equipment Utilized During Treatment: Gait belt Activity Tolerance: Patient limited by pain Patient left: in chair;with call bell/phone within reach;with chair alarm set;with family/visitor present Nurse Communication: Mobility status PT Visit Diagnosis: Unsteadiness on feet (R26.81);Muscle weakness (generalized) (M62.81);Difficulty in walking, not elsewhere classified (R26.2)     Time: 1751-0258 PT Time Calculation (min) (ACUTE ONLY): 29 min  Charges:  $Therapeutic Exercise: 8-22 mins $Therapeutic Activity: 8-22 mins                      Sandria Manly, Daymon Larsen 05/27/2020, 4:23 PM

## 2020-05-27 NOTE — Progress Notes (Signed)
Nutrition Follow-up  DOCUMENTATION CODES:   Not applicable  INTERVENTION:   -Continue with regular diet -Ensure Enlive po BID, each supplement provides 350 kcal and 20 grams of protein -MVI with minerals daily  NUTRITION DIAGNOSIS:   Increased nutrient needs related to hip fracture as evidenced by estimated needs.  Ongoing  GOAL:   Patient will meet greater than or equal to 90% of their needs  Progressing   MONITOR:   PO intake,Supplement acceptance,Weight trends,Labs,I & O's,Skin  REASON FOR ASSESSMENT:   Consult Hip fracture protocol  ASSESSMENT:   Patient with PMH significant for CHF, CKD III, follicular lymphoma s/p chemo in 2016, HLD, HTN, and pulmonary HTN. Presents this admission after mechanical fall resulting in L hip fracture.  5/3- s/p PROCEDURE: Cephalomedullary nail of left intertrochanteric hip fracture   Reviewed I/O's: -500 ml x 24 hours and +300 ml x 24 hours  UOP: 500 ml x 24 hours  Pt unavailable at time of visit. Attempted to speak with pt via call to hospital room phone, however, unable to reach.   No meal completion data available to assess. Pt is currently on a regular diet.   Pt with increased nutritional needs for post-operative healing and would benefit from addition of oral nutrition supplements.   Per therapy notes, recommending CIR.   Medications reviewed and include colace and potassium chloride.  Labs reviewed.   Diet Order:   Diet Order            Diet regular Room service appropriate? Yes; Fluid consistency: Thin  Diet effective now                 EDUCATION NEEDS:   Education needs have been addressed  Skin:  Skin Assessment: Skin Integrity Issues: Skin Integrity Issues:: Incisions Incisions: closed lt hip  Last BM:  05/24/20  Height:   Ht Readings from Last 1 Encounters:  05/25/20 5\' 6"  (1.676 m)    Weight:   Wt Readings from Last 1 Encounters:  05/25/20 93.4 kg   BMI:  Body mass index is 33.23  kg/m.  Estimated Nutritional Needs:   Kcal:  1800-2000 kcal  Protein:  90-105 lb  Fluid:  >/= 1.6 L/day    Loistine Chance, RD, LDN, CDCES Registered Dietitian II Certified Diabetes Care and Education Specialist Please refer to [facility] for RD and/or RD on-call/weekend/after hours pager

## 2020-05-28 LAB — BASIC METABOLIC PANEL WITH GFR
Anion gap: 3 — ABNORMAL LOW (ref 5–15)
BUN: 18 mg/dL (ref 8–23)
CO2: 31 mmol/L (ref 22–32)
Calcium: 8 mg/dL — ABNORMAL LOW (ref 8.9–10.3)
Chloride: 104 mmol/L (ref 98–111)
Creatinine, Ser: 0.82 mg/dL (ref 0.44–1.00)
GFR, Estimated: >60 mL/min
Glucose, Bld: 117 mg/dL — ABNORMAL HIGH (ref 70–99)
Potassium: 4 mmol/L (ref 3.5–5.1)
Sodium: 138 mmol/L (ref 135–145)

## 2020-05-28 LAB — BPAM RBC
Blood Product Expiration Date: 202205202359
Blood Product Expiration Date: 202206112359
ISSUE DATE / TIME: 202205052006
Unit Type and Rh: 5100
Unit Type and Rh: 9500

## 2020-05-28 LAB — TYPE AND SCREEN
ABO/RH(D): AB POS
Antibody Screen: POSITIVE
PT AG Type: NEGATIVE
Unit division: 0
Unit division: 0

## 2020-05-28 LAB — CBC WITH DIFFERENTIAL/PLATELET
Abs Immature Granulocytes: 0.02 10*3/uL (ref 0.00–0.07)
Basophils Absolute: 0 10*3/uL (ref 0.0–0.1)
Basophils Relative: 0 %
Eosinophils Absolute: 0.1 10*3/uL (ref 0.0–0.5)
Eosinophils Relative: 3 %
HCT: 26.1 % — ABNORMAL LOW (ref 36.0–46.0)
Hemoglobin: 8.7 g/dL — ABNORMAL LOW (ref 12.0–15.0)
Immature Granulocytes: 1 %
Lymphocytes Relative: 25 %
Lymphs Abs: 0.9 10*3/uL (ref 0.7–4.0)
MCH: 31.1 pg (ref 26.0–34.0)
MCHC: 33.3 g/dL (ref 30.0–36.0)
MCV: 93.2 fL (ref 80.0–100.0)
Monocytes Absolute: 0.4 10*3/uL (ref 0.1–1.0)
Monocytes Relative: 11 %
Neutro Abs: 2.1 10*3/uL (ref 1.7–7.7)
Neutrophils Relative %: 60 %
Platelets: 85 10*3/uL — ABNORMAL LOW (ref 150–400)
RBC: 2.8 MIL/uL — ABNORMAL LOW (ref 3.87–5.11)
RDW: 14.3 % (ref 11.5–15.5)
WBC: 3.5 10*3/uL — ABNORMAL LOW (ref 4.0–10.5)
nRBC: 0 % (ref 0.0–0.2)

## 2020-05-28 MED ORDER — APIXABAN 5 MG PO TABS
5.0000 mg | ORAL_TABLET | Freq: Two times a day (BID) | ORAL | Status: DC
  Administered 2020-05-28 – 2020-06-03 (×13): 5 mg via ORAL
  Filled 2020-05-28 (×13): qty 1

## 2020-05-28 NOTE — Care Management Important Message (Signed)
Important Message  Patient Details  Name: Amanda Sellers MRN: [id number] Date of Birth: Oct 30, 1932   Medicare Important Message Given:  Yes - Important Message mailed due to current National Emergency   Verbal consent obtained due to current National Emergency  Relationship to patient: Self Contact Name: Maycee Call Date: 05/28/20  Time: 1020 Phone: [phone] Outcome: Spoke with contact Important Message mailed to: Patient address on file    Delorse Lek 05/28/2020, 10:25 AM

## 2020-05-28 NOTE — Progress Notes (Signed)
Occupational Therapy Evaluation Patient Details Name: Amanda Sellers MRN: [id number] DOB: 11-Feb-1932 Today's Date: 05/28/2020    History of Present Illness 87 yo female with onset of fall at home, was brought to hosp with proximal femoral intertrochanteric fracture.  After ORIF was WBAT.  PMHx:  a-fib, Eliquis, CHF, HTN, fall   Clinical Impression   Amanda Sellers was evaluated for the above diagnoses/treatment, pt was 10/10 pain with any movement of the LLE. Pt's daughter was present for the entire session.Pt reported being indep PTA, lives with family who is not home 24/7.  Due to pt's pain level this eval was completed at chair level. Pt's ADLs and mobility are requiring max/total A +2 at this time due to pain. Due to the pt's assist level, recommend d/c to SNF for rehab to progress function however pt is refusing and will likely go home  with 24/7 care from family. Pt's daughter verbalized that they can provide her assist levels. Spent increased time educating pt and family about home safety, BSC and bed pan use, hoyer lift, and hospital bed. Pt would benefit from OT services acutely to progress pt ADL and mobility function to +1 assist. Continue to recommend d/c to SNF, however if pt d/c to home will need HHOT.     Follow Up Recommendations  SNF;Supervision/Assistance - 24 hour    Equipment Recommendations  3 in 1 bedside commode;Wheelchair (measurements OT);Hospital bed;Other (comment) Harrel Lemon, EMS transport home)       Precautions / Restrictions Precautions Precautions: Fall Precaution Comments: RLE pain prior to fall Restrictions Weight Bearing Restrictions: No LLE Weight Bearing: Weight bearing as tolerated      Mobility Bed Mobility Overal bed mobility: Needs Assistance Bed Mobility: Supine to Sit Rolling: Max assist;+2 for physical assistance         General bed mobility comments: Max A +2 for pain    Transfers Overall transfer level: Needs assistance Equipment used:   Denna Haggard) Transfers: Sit to/from Stand Sit to Stand: Total assist;+2 physical assistance;Max assist         General transfer comment: Pt in chair upon arrival, refusing transfer given 2 different attempts, pt agreeable to hoyer transfer back to bed "this afternoon"    Balance Overall balance assessment: History of Falls;Needs assistance Sitting-balance support: Feet supported;Bilateral upper extremity supported Sitting balance-Leahy Scale: Poor          ADL either performed or assessed with clinical judgement   ADL Overall ADL's : Needs assistance/impaired Eating/Feeding: Set up;Sitting   Grooming: Wash/dry hands;Wash/dry face;Oral care;Applying deodorant;Brushing hair;Set up;Sitting   Upper Body Bathing: Maximal assistance;Bed level   Lower Body Bathing: Total assistance;+2 for physical assistance;Bed level   Upper Body Dressing : Moderate assistance;Sitting   Lower Body Dressing: Total assistance;+2 for physical assistance;Bed level   Toilet Transfer: Total assistance;+2 for physical assistance;+2 for safety/equipment Toilet Transfer Details (indicate cue type and reason): Pt unable to use bed pan due to pain; unsafe for [facility] transfer Toileting- Clothing Manipulation and Hygiene: Total assistance;+2 for physical assistance;Bed level       Functional mobility during ADLs: Total assistance;+2 for physical assistance;+2 for safety/equipment (hoyer) General ADL Comments: Max/total A for ADLs due to BLE pain and global weakness                  Pertinent Vitals/Pain Pain Assessment: 0-10 Pain Score: 10-Worst pain ever Faces Pain Scale: Hurts whole lot Pain Location: L hip Pain Descriptors / Indicators: Grimacing;Moaning;Guarding Pain Intervention(s): Limited activity within patient's  tolerance;RN gave pain meds during session;Patient requesting pain meds-RN notified;Monitored during session     Hand Dominance Right   Extremity/Trunk Assessment Upper Extremity  Assessment Upper Extremity Assessment: Generalized weakness   Lower Extremity Assessment Lower Extremity Assessment: Defer to PT evaluation   Cervical / Trunk Assessment Cervical / Trunk Assessment: Kyphotic   Communication Communication Communication: No difficulties   Cognition Arousal/Alertness: Awake/alert Behavior During Therapy: WFL for tasks assessed/performed;Anxious Overall Cognitive Status: Within Functional Limits for tasks assessed             General Comments: Seemingly anxious with the anticipation of movement/pain, family in the room does a lot of talking for the pt   General Comments  Spent increased time educating pt and daughter about home set up, equipment, and how to increase safety upon d/c home    Exercises General Exercises - Lower Extremity Ankle Circles/Pumps: AROM;20 reps;Supine Quad Sets: AROM;Both;10 reps;Supine (minimal quad activation noted on LLE) Heel Slides: AAROM;Supine;10 reps;Both Hip ABduction/ADduction: AAROM;Both;10 reps;Supine        Home Living Family/patient expects to be discharged to:: Skilled nursing facility Living Arrangements: Children            Additional Comments: son is not home 24/7      Prior Functioning/Environment Level of Independence: Independent with assistive device(s)        Comments: 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        OT Problem List: Decreased strength;Decreased range of motion;Decreased activity tolerance;Impaired balance (sitting and/or standing);Decreased safety awareness;Decreased knowledge of precautions;Pain      OT Treatment/Interventions: Self-care/ADL training;Therapeutic exercise;DME and/or AE instruction;Manual therapy;Therapeutic activities;Patient/family education;Balance training    OT Goals(Current goals can be found in the care plan section) Acute Rehab OT Goals Patient Stated Goal: to go home OT Goal Formulation: With patient Time For Goal  Achievement: 06/11/20 Potential to Achieve Goals: Fair ADL Goals Pt Will Perform Upper Body Bathing: with supervision;sitting Pt Will Perform Upper Body Dressing: with set-up;sitting Additional ADL Goal #1: Pt will indep direct caregiver on how to complete hoyer lift to prepare for safe d/c to home Additional ADL Goal #2: Pt will tolerate use of bed pan at bed level with Min A for rolling and placement.  OT Frequency: Min 2X/week    AM-PAC OT "6 Clicks" Daily Activity     Outcome Measure Help from another person eating meals?: None Help from another person taking care of personal grooming?: A Little Help from another person toileting, which includes using toliet, bedpan, or urinal?: Total Help from another person bathing (including washing, rinsing, drying)?: A Lot Help from another person to put on and taking off regular upper body clothing?: A Lot Help from another person to put on and taking off regular lower body clothing?: Total 6 Click Score: 13   End of Session Nurse Communication: Mobility status;Need for lift equipment  Activity Tolerance: Patient limited by pain Patient left: in chair;with call bell/phone within reach;with chair alarm set;with family/visitor present  OT Visit Diagnosis: Unsteadiness on feet (R26.81);Other abnormalities of gait and mobility (R26.89);Repeated falls (R29.6);Muscle weakness (generalized) (M62.81);Pain Pain - Right/Left: Left Pain - part of body: Hip                Time: 9485-4627 OT Time Calculation (min): 27 min Charges:  OT General Charges $OT Visit: 1 Visit OT Evaluation $OT Eval Moderate Complexity: 1 Mod OT Treatments $Self Care/Home Management : 8-22 mins    Aldona Bar  A Sarahjane Matherly 05/28/2020, 3:46 PM

## 2020-05-28 NOTE — Progress Notes (Signed)
Patient ID: Amanda Sellers, female   DOB: 1933-01-01, 87 y.o.   MRN: [id number]  PROGRESS NOTE    [name]  IEP:[id number] DOB: 05/05/1932 DOA: 05/25/2020 PCP: Tammi Sou, MD    Brief Narrative:   Amanda Nies Friddleis a 87 y.o.femalewith PMH significant forA.Fib on Eliquis, dCHF, HTN. Patient was brought to the ED from home on 05/24/2020 for fall from losing her balance while she was getting ready for bed. She had severe pain to the left hip following fall.  In the ED, afebrile, blood pressure elevated up to 200/105 Hip x-ray showed medially angulated reverse oblique intertrochanteric fracture of the proximal left femur. Skeletal survey negative otherwise ED physician discussed the case with orthopedist Dr. Lucia Gaskins. Patient was made NPO. Eliquis was held. Patient was admitted to hospitalist service. 5/3, patient underwent cephalomedullary nail of left intertrochanteric hip fracture  Assessment & Plan:   Principal Problem:   Fracture, proximal femur, left, closed, initial encounter ([place]) Active Problems:   Chronic diastolic CHF (congestive heart failure) (HCC)   Hypertension   Paroxysmal atrial fibrillation ([place]); CHA2DS2Vasc 6 - Eliquis; Amio 100 mg daily   Anticoagulation adequate, Eliquis   LBBB (left bundle branch block)  Intertrochanteric left femur fracture -After fall from losing her balance -5/3, patient underwent cephalomedullary nail of left intertrochanteric hip fracture -I will treat her pain regimen today. We will use scheduled oral Norco and as needed IV morphine. I would avoid IV Dilaudid or IV fentanyl at this time. As needed MiraLAX ordered -Continue Eliquis once Hgb is stable-resume today  Acute blood loss anemia -Hemoglobin normal at baseline. Dropped down to 9.2 prior to surgery (probably due to blood loss from fracture)and 8.6 after the surgery,probably due tointra-op blood loss. -Continue to monitor--further dropped today to  7.7.Status post 1 unit of packed red blood cells hemoglobin up to 8.7 today.  We will resume Eliquis  AKIon CKD stage IIIa -Creatinine elevated this morning likely because of prolonged hypotension yesterday. -Start on normal saline at 50 mill per hour for next 24 hours -Creatinine down to 0. 82 today  History of systolic CHF  Essential hypertension  -Home meds include amlodipine 2.5 mg daily, Lasix 40 mg daily, Entresto 24/26 mg twice daily -Echo from 04/2019 had shown a low EF of 35 to 40% which improved to 55 to 60% on repeat echo on 09/2019. -Blood pressure was low last night and this morning. -Continue Entresto.  -Continue amlodipine and Lasix  Paroxysmal A. fib -Cont amiodarone 200 mg daily -Continue Eliquis  Hyperlipidemia -Continue Crestor 20 mg daily  Hypothyroidism -Continue Synthroid 100 mcg daily  Anxiety -Klonopin 0.25 mg twice daily as needed   Chronic iron deficiency anemia -Continue iron supplement   DVT prophylaxis: SCD/Compression stockings and YS:AYTKZSW Code Status: Full code  Family Communication: Daughter at bedside Disposition Plan: Home with home PT   Consultants:   Orthopedics  Procedures:  Cephalomedullary nail of left intertrochanteric hip fracture  Antimicrobials: Anti-infectives (From admission, onward)   Start     Dose/Rate Route Frequency Ordered Stop   05/25/20 2100  ceFAZolin (ANCEF) 1 g in sodium chloride 0.9 % 100 mL IVPB        1 g 200 mL/hr over 30 Minutes Intravenous Every 6 hours 05/25/20 1720 05/26/20 0914   05/25/20 1345  ceFAZolin (ANCEF) IVPB 2g/100 mL premix        2 g 200 mL/hr over 30 Minutes Intravenous On call to O.R. 05/25/20 1335 05/25/20 1454  Subjective: Reports feeling better today.  She has started to do some exercises in bed with her daughter  Objective: Vitals:   05/27/20 2303 05/28/20 0029 05/28/20 0426 05/28/20 0917  BP: (!) 117/39 (!) 115/41 (!) 129/50 120/64  Pulse: 61 66 61 62   Resp: 16 15 18 16   Temp: 98.1 F (36.7 C) 97.6 F (36.4 C) 98.8 F (37.1 C) 98.2 F (36.8 C)  TempSrc: Oral Oral Oral Oral  SpO2: 97% 97% 99% 100%  Weight:      Height:        Intake/Output Summary (Last 24 hours) at 05/28/2020 1109 Last data filed at 05/28/2020 0500 Gross per 24 hour  Intake 630 ml  Output 300 ml  Net 330 ml   Filed Weights   05/25/20 0030  Weight: 93.4 kg    Examination:  General exam: Appears calm and comfortable  Respiratory system: Clear to auscultation. Respiratory effort normal. Cardiovascular system: S1 & S2 heard, RRR.  Gastrointestinal system: Abdomen is nondistended, soft and nontender.  Central nervous system: Alert and oriented. No focal neurological deficits. Extremities: Symmetric  Skin: No rashes Psychiatry: Judgement and insight appear normal. Mood & affect appropriate.     Data Reviewed: I have personally reviewed following labs and imaging studies  CBC: Recent Labs  Lab 05/25/20 0028 05/25/20 0200 05/26/20 0158 05/26/20 0824 05/27/20 0324 05/28/20 0127  WBC 6.7  --  7.8  --  4.2 3.5*  NEUTROABS 5.5  --  6.2  --  2.9 2.1  HGB 13.3 12.2 9.2* 8.6* 7.4* 8.7*  HCT 41.2 36.0 28.3* 25.8* 22.6* 26.1*  MCV 96.9  --  97.3  --  97.4 93.2  PLT 121*  --  122*  --  PLATELET CLUMPS NOTED ON SMEAR, UNABLE TO ESTIMATE 85*   Basic Metabolic Panel: Recent Labs  Lab 05/25/20 0154 05/25/20 0200 05/26/20 0158 05/27/20 0324 05/28/20 0127  NA 140 141 140 138 138  K 3.9 4.3 4.3 3.8 4.0  CL 102 103 104 102 104  CO2 28  --  28 30 31   GLUCOSE 178* 180* 137* 125* 117*  BUN 19 22 24* 20 18  CREATININE 1.05* 0.90 1.17* 0.99 0.82  CALCIUM 9.1  --  8.0* 7.8* 8.0*   GFR: Estimated Creatinine Clearance: 57.7 mL/min (by C-G formula based on SCr of 0.82 mg/dL). Sepsis Labs:   Recent Results (from the past 240 hour(s))  Resp Panel by RT-PCR (Flu A&B, Covid) Nasopharyngeal Swab     Status: None   Collection Time: 05/25/20 12:29 AM   Specimen:  Nasopharyngeal Swab; Nasopharyngeal(NP) swabs in vial transport medium  Result Value Ref Range Status   SARS Coronavirus 2 by RT PCR NEGATIVE NEGATIVE Final    Comment: (NOTE) SARS-CoV-2 target nucleic acids are NOT DETECTED.  The SARS-CoV-2 RNA is generally detectable in upper respiratory specimens during the acute phase of infection. The lowest concentration of SARS-CoV-2 viral copies this assay can detect is 138 copies/mL. A negative result does not preclude SARS-Cov-2 infection and should not be used as the sole basis for treatment or other patient management decisions. A negative result may occur with  improper specimen collection/handling, submission of specimen other than nasopharyngeal swab, presence of viral mutation(s) within the areas targeted by this assay, and inadequate number of viral copies(<138 copies/mL). A negative result must be combined with clinical observations, patient history, and epidemiological information. The expected result is Negative.  Fact Sheet for Patients:  EntrepreneurPulse.com.au  Fact Sheet for Healthcare Providers:  IncredibleEmployment.be  This test is no t yet approved or cleared by the [place] and  has been authorized for detection and/or diagnosis of SARS-CoV-2 by FDA under an Emergency Use Authorization (EUA). This EUA will remain  in effect (meaning this test can be used) for the duration of the COVID-19 declaration under Section 564(b)(1) of the Act, 21 U.S.C.section 360bbb-3(b)(1), unless the authorization is terminated  or revoked sooner.       Influenza A by PCR NEGATIVE NEGATIVE Final   Influenza B by PCR NEGATIVE NEGATIVE Final    Comment: (NOTE) The Xpert Xpress SARS-CoV-2/FLU/RSV plus assay is intended as an aid in the diagnosis of influenza from Nasopharyngeal swab specimens and should not be used as a sole basis for treatment. Nasal washings and aspirates are unacceptable for  Xpert Xpress SARS-CoV-2/FLU/RSV testing.  Fact Sheet for Patients: EntrepreneurPulse.com.au  Fact Sheet for Healthcare Providers: IncredibleEmployment.be  This test is not yet approved or cleared by the [place] FDA and has been authorized for detection and/or diagnosis of SARS-CoV-2 by FDA under an Emergency Use Authorization (EUA). This EUA will remain in effect (meaning this test can be used) for the duration of the COVID-19 declaration under Section 564(b)(1) of the Act, 21 U.S.C. section 360bbb-3(b)(1), unless the authorization is terminated or revoked.  Performed at [facility], [place] [address], [place], [place] [address]   Surgical pcr screen     Status: None   Collection Time: 05/25/20  9:09 AM   Specimen: Nasal Mucosa; Nasal Swab  Result Value Ref Range Status   MRSA, PCR NEGATIVE NEGATIVE Final   Staphylococcus aureus NEGATIVE NEGATIVE Final    Comment: (NOTE) The Xpert SA Assay (FDA approved for NASAL specimens in patients 22 years of age and older), is one component of a comprehensive surveillance program. It is not intended to diagnose infection nor to guide or monitor treatment. Performed at [facility], [place] [address] [place], [place] [address]       Radiology Studies: No results found.   Scheduled Meds: . sodium chloride   Intravenous Once  . amiodarone  200 mg Oral Daily  . apixaban  5 mg Oral BID  . docusate sodium  100 mg Oral BID  . feeding supplement  237 mL Oral BID BM  . ferrous sulfate  325 mg Oral BID WC  . HYDROcodone-acetaminophen  1 tablet Oral Q6H  . levothyroxine  100 mcg Oral Q0600  . multivitamin with minerals  1 tablet Oral Daily  . potassium chloride SA  20 mEq Oral Daily  . rosuvastatin  20 mg Oral Daily  . sacubitril-valsartan  1 tablet Oral BID   Continuous Infusions:   LOS: 3 days    Donnamae Jude, MD 05/28/2020 11:09 AM [phone] [facility] If 7PM-7AM,  please contact night-coverage 05/28/2020, 11:09 AM

## 2020-05-28 NOTE — Progress Notes (Addendum)
Physical Therapy Treatment Patient Details Name: Amanda Sellers MRN: [id number] DOB: May 03, 1932 Today's Date: 05/28/2020    History of Present Illness 87 yo female with onset of fall at home, was brought to hosp with proximal femoral intertrochanteric fracture.  After ORIF was WBAT.  PMHx:  a-fib, Eliquis, CHF, HTN, fall    PT Comments    Pt supine in bed on arrival. Pt able to tolerate treatment better this session, most likely due to receiving morphine prior to session. She is slowly progressing towards goals at this time. She participated more with standing in the sara stedy this session and would benefit from continued PT to improve strength and functional mobility. Plan to continue with strengthening at next session. SNF at d/c remains appropriate, however pt and family will refuse.   Follow Up Recommendations  SNF;Supervision/Assistance - 24 hour;Other (comment) (Pt and family will refuse SNF placement. Will need HHPT and HH aid.)     Equipment Recommendations  Wheelchair cushion (measurements PT);Wheelchair (measurements PT);Hospital bed;Other (comment) Journalist, newspaper; EMS transfer to home.)    Recommendations for Other Services Rehab consult     Precautions / Restrictions Precautions Precautions: Fall Restrictions Weight Bearing Restrictions: Yes LLE Weight Bearing: Weight bearing as tolerated    Mobility  Bed Mobility Overal bed mobility: Needs Assistance Bed Mobility: Supine to Sit Rolling: Max assist;+2 for physical assistance         General bed mobility comments: continues to require max A +2 due to pain.    Transfers Overall transfer level: Needs assistance Equipment used:  Denna Haggard) Transfers: Sit to/from Stand Sit to Stand: Total assist;+2 physical assistance;Max assist         General transfer comment: Pt requiring max A +2 to power into standing and total A to maintain standing while putting stedy plates in  place.  Ambulation/Gait                 Stairs             Wheelchair Mobility    Modified Rankin (Stroke Patients Only)       Balance Overall balance assessment: History of Falls;Needs assistance Sitting-balance support: Feet supported;Bilateral upper extremity supported Sitting balance-Leahy Scale: Poor                                      Cognition Arousal/Alertness: Awake/alert Behavior During Therapy: WFL for tasks assessed/performed;Anxious Overall Cognitive Status: Within Functional Limits for tasks assessed                                 General Comments: Anxious in anticipation of pain      Exercises General Exercises - Lower Extremity Ankle Circles/Pumps: AROM;20 reps;Supine Quad Sets: AROM;Both;10 reps;Supine (minimal quad activation noted on LLE) Heel Slides: AAROM;Supine;10 reps;Both Hip ABduction/ADduction: AAROM;Both;10 reps;Supine    General Comments        Pertinent Vitals/Pain Pain Assessment: Faces Faces Pain Scale: Hurts whole lot Pain Location: L hip Pain Descriptors / Indicators: Grimacing;Moaning;Guarding Pain Intervention(s): Limited activity within patient's tolerance;Monitored during session;Repositioned;Patient requesting pain meds-RN notified    Home Living                      Prior Function            PT Goals (current  goals can now be found in the care plan section) Acute Rehab PT Goals Potential to Achieve Goals: Fair Progress towards PT goals: Progressing toward goals    Frequency    Min 3X/week      PT Plan Current plan remains appropriate    Co-evaluation              AM-PAC PT "6 Clicks" Mobility   Outcome Measure  Help needed turning from your back to your side while in a flat bed without using bedrails?: A Lot Help needed moving from lying on your back to sitting on the side of a flat bed without using bedrails?: A Lot Help needed moving to and  from a bed to a chair (including a wheelchair)?: A Lot Help needed standing up from a chair using your arms (e.g., wheelchair or bedside chair)?: Total Help needed to walk in hospital room?: Total Help needed climbing 3-5 steps with a railing? : Total 6 Click Score: 9    End of Session Equipment Utilized During Treatment: Gait belt Activity Tolerance: Patient limited by pain Patient left: in chair;with call bell/phone within reach;with chair alarm set;with family/visitor present Nurse Communication: Mobility status;Patient requests pain meds PT Visit Diagnosis: Unsteadiness on feet (R26.81);Muscle weakness (generalized) (M62.81);Difficulty in walking, not elsewhere classified (R26.2)     Time: 7169-6789 PT Time Calculation (min) (ACUTE ONLY): 33 min  Charges:  $Therapeutic Exercise: 8-22 mins $Therapeutic Activity: 8-22 mins                      Sandria Manly, SPTA   Sandria Manly 05/28/2020, 1:18 PM

## 2020-05-28 NOTE — Progress Notes (Signed)
     Amanda Sellers is a 87 y.o. female   Orthopaedic diagnosis:status post left cephalo-medullary nail for intertrochanteric hip fracture  Subjective: She is doing better today.  She has done some mobilization with physical therapy but has not done much weightbearing.  She is working on range of motion exercises.  Her pain is improving.  Objectyive: Vitals:   05/28/20 0426 05/28/20 0917  BP: (!) 129/50 120/64  Pulse: 61 62  Resp: 18 16  Temp: 98.8 F (37.1 C) 98.2 F (36.8 C)  SpO2: 99% 100%     Exam: Awake and alert Respirations even and unlabored No acute distress  Left hip with dressing in place.  No increased shadowing.  She tolerates gentle hip motion.  No tenderness to calf squeeze.  She is dorsiflexing and plantar flexing the ankle well.  Sensation grossly intact distally.  Assessment: Status post left cephalo-medullary nail for intertrochanteric hip fracture, doing well   Plan: She will continue to mobilize with physical therapy.  They have recommended SNF placement but the patient is not interested in going to a skilled nursing facility.  She will likely go home with home health.  She is suitable for discharge from orthopedic standpoint.  I will see her back in 2 weeks for x-rays of the left hip and wound check for suture removal if appropriate.   Radene Journey, MD

## 2020-05-28 NOTE — Plan of Care (Signed)

## 2020-05-28 NOTE — TOC Initial Note (Signed)
Transition of Care [facility]) - Initial/Assessment Note    Patient Details  Name: Amanda Sellers MRN: [id number] Date of Birth: June 27, 1932  Transition of Care [facility]) CM/SW Contact:    Milinda Antis, [place] Phone Number: 05/28/2020, 1:52 PM  Clinical Narrative:                 CSW met with the patient and patient's daughter [name] at bedside to initiate d/c planning.  The patient was alert and oriented x 4 during the assessment.  The patient refused SNF reporting that she has 8 children who can assist with her care.  The patient's adult son is currently living with her.  The patient's adult daughter was in agreement.    CSW inquired about choice of [place] agencies.  The patient or her daughter did not have a preference.    CSW verified phone number and address.  CSW called Georgina Snell with [facility].  They will are willing to accept the patient.  Patient and adult daughter were informed.    Expected Discharge Plan: [place] Barriers to Discharge: Continued Medical Work up   Patient Goals and CMS Choice Patient states their goals for this hospitalization and ongoing recovery are:: Return home to be cared for by adult children CMS Medicare.gov Compare Post Acute Care list provided to:: Patient Choice offered to / list presented to : Adult Children,Patient  Expected Discharge Plan and Services Expected Discharge Plan: [place]   Discharge Planning Services: CM Consult Post Acute Care Choice: Home Health,Durable Medical Equipment Living arrangements for the past 2 months: Single Family Home                 DME Arranged: Hospital bed,Lightweight manual wheelchair with seat cushion,Other see comment (hoyer lift) DME Agency: [employer] Date DME Agency Contacted: 05/28/20 Time DME Agency Contacted: 1025 Representative spoke with at DME Agency: [place]: Nurse's Aide,PT,OT [place] Agency: [place] Date [place]: 05/28/20 Time [place]: 17 Representative spoke with at [place]: Georgina Snell  Prior Living Arrangements/Services Living arrangements for the past 2 months: [place] with:: [place] Patient language and need for interpreter reviewed:: Yes Do you feel safe going back to the place where you live?: Yes      Need for Family Participation in Patient Care: Yes (Comment) Care giver support system in place?: Yes (comment)   Criminal Activity/Legal Involvement Pertinent to Current Situation/Hospitalization: No - Comment as needed  Activities of Daily Living Home Assistive Devices/Equipment: Eyeglasses,Dentures (specify type),Walker (specify type) ADL Screening (condition at time of admission) Patient's cognitive ability adequate to safely complete daily activities?: Yes Is the patient deaf or have difficulty hearing?: No Does the patient have difficulty seeing, even when wearing glasses/contacts?: No Does the patient have difficulty concentrating, remembering, or making decisions?: No Patient able to express need for assistance with ADLs?: Yes Does the patient have difficulty dressing or bathing?: No Independently performs ADLs?: Yes (appropriate for developmental age) Does the patient have difficulty walking or climbing stairs?: Yes Weakness of Legs: Left Weakness of Arms/Hands: None  Permission Sought/Granted   Permission granted to share information with : Yes, Verbal Permission Granted     Permission granted to share info w AGENCY: Home health agencies        Emotional Assessment Appearance:: Appears stated age Attitude/Demeanor/Rapport: Gracious,Engaged Affect (typically observed): Accepting,Appropriate Orientation: : Oriented to Self,Oriented to Place,Oriented to  Time,Oriented to Situation Alcohol /  Substance Use: Not Applicable Psych Involvement: No (comment)  Admission diagnosis:  Closed fracture of left hip, initial encounter (HCC) [S72.002A] Fracture,  proximal femur, left, closed, initial encounter (HCC) [S72.002A] Patient Active Problem List   Diagnosis Date Noted  . LBBB (left bundle branch block) 05/25/2020  . Fracture, proximal femur, left, closed, initial encounter (HCC) 05/25/2020  . Restrictive lung disease 10/01/2019  . Hypothyroidism 05/21/2019  . Pulmonary hypertension due to left heart disease (HCC)   . Coronary artery disease involving native coronary artery of native heart without angina pectoris   . Dilated cardiomyopathy (HCC)   . Stomatitis 08/14/2016  . Osteoarthritis 05/08/2016  . Pernicious anemia 06/29/2015  . Abnormal finding on pulmonary function testing 05/10/2015  . History of pernicious anemia 05/10/2015  . Other iron deficiency anemias 05/10/2015  . On amiodarone therapy 02/11/2015  . Anticoagulation adequate, Eliquis 10/16/2014  . Paroxysmal atrial fibrillation (HCC); CHA2DS2Vasc 6 - Eliquis; Amio 100 mg daily 10/15/2014  . Microcytic anemia 08/09/2014  . Positive occult stool blood test 08/08/2014  . Symptomatic anemia 08/07/2014  . Acute on chronic diastolic CHF (congestive heart failure), NYHA class 3 (HCC) 08/07/2014  . Pancytopenia due to antineoplastic chemotherapy (HCC) 08/07/2014  . Follicular lymphoma (HCC) 07/29/2014  . Lymphadenopathy   . Anemia 07/22/2014  . Dyspnea 07/22/2014  . Chronic diastolic CHF (congestive heart failure) (HCC) 07/22/2014  . Hypertension 07/22/2014  . Hyperlipidemia 07/22/2014  . Osteoporosis 03/01/2006   PCP:  McGowen, Philip H, MD Pharmacy:   [facility] - [place] - [address] [place] [address] Phone: [phone] Fax: [phone]  [id number] - [place], [place] - [address] [place] [address] Phone: [phone] Fax: [phone]     Social Determinants of Health (SDOH) Interventions    Readmission Risk Interventions No flowsheet  data found.  

## 2020-05-29 ENCOUNTER — Inpatient Hospital Stay (HOSPITAL_COMMUNITY): Payer: PPO

## 2020-05-29 LAB — CBC
HCT: 26.6 % — ABNORMAL LOW (ref 36.0–46.0)
Hemoglobin: 8.9 g/dL — ABNORMAL LOW (ref 12.0–15.0)
MCH: 31.4 pg (ref 26.0–34.0)
MCHC: 33.5 g/dL (ref 30.0–36.0)
MCV: 94 fL (ref 80.0–100.0)
Platelets: 94 10*3/uL — ABNORMAL LOW (ref 150–400)
RBC: 2.83 MIL/uL — ABNORMAL LOW (ref 3.87–5.11)
RDW: 14.6 % (ref 11.5–15.5)
WBC: 3.3 10*3/uL — ABNORMAL LOW (ref 4.0–10.5)
nRBC: 0 % (ref 0.0–0.2)

## 2020-05-29 NOTE — Plan of Care (Signed)
  Problem: Education: Goal: Knowledge of General Education information will improve Description Including pain rating scale, medication(s)/side effects and non-pharmacologic comfort measures Outcome: Progressing   Problem: Health Behavior/Discharge Planning: Goal: Ability to manage health-related needs will improve Outcome: Progressing   

## 2020-05-29 NOTE — Progress Notes (Addendum)
PROGRESS NOTE  Amanda Sellers VOJ:[id number] DOB: 05/08/1932 DOA: 05/25/2020 PCP: Tammi Sou, MD   LOS: 4 days   Brief Narrative / Interim history: 87 year old with history of A. fib on Eliquis, diastolic CHF, HTN came into the hospital after falling down after she experienced balance problems.  She had severe left hip pain following the fall, x-ray showed hip fracture, orthopedic consulted and she is status postrepair on 5/3.  Subjective / 24h Interval events: Had a rough morning per daughter.  She was more confused and disoriented.  Has been complaining of significant right hip pain and inability to move her right leg  Assessment & Plan: Principal Problem Intertrochanteric left femur fracture-after fall from losing her balance.  Orthopedic surgery consulted and followed patient, on 5/3 she underwent cephalomedullary nail of the left intertrochanteric hip fracture.  Continue pain control, she seems to be in severe pain still requiring morphine last night. -PT recommended SNF, however patient and her daughter do not want SNF but would like to pursue inpatient rehab, do not feel like they will be able to take care of her at home currently.  Consulted CIR -Significant right hip pain and inability to put any weight on the right hip, will x-ray that today  Active Problems Acute blood loss anemia, iron deficiency-hemoglobin stable, received a total of 1 unit of packed red blood cells for hemoglobin of 7.7.  Continue Eliquis for A. fib, no bleeding -Continue iron  Thrombocytopenia, acute on chronic-platelets stable, monitor  Acute kidney injury on chronic kidney disease stage IIIa-creatinine now normalized  History of systolic CHF, HTN-continue Entresto, Lasix is now on hold  Essential hypertension-continue Entresto  Hypothyroidism-continue Synthroid  Paroxysmal A. fib-continue amiodarone, anticoagulated with Eliquis  Anxiety-continue home medications   Scheduled Meds: .  sodium chloride   Intravenous Once  . amiodarone  200 mg Oral Daily  . apixaban  5 mg Oral BID  . docusate sodium  100 mg Oral BID  . feeding supplement  237 mL Oral BID BM  . ferrous sulfate  325 mg Oral BID WC  . HYDROcodone-acetaminophen  1 tablet Oral Q6H  . levothyroxine  100 mcg Oral Q0600  . multivitamin with minerals  1 tablet Oral Daily  . potassium chloride SA  20 mEq Oral Daily  . rosuvastatin  20 mg Oral Daily  . sacubitril-valsartan  1 tablet Oral BID   Continuous Infusions: PRN Meds:.acetaminophen, clonazePAM, metoCLOPramide **OR** metoCLOPramide (REGLAN) injection, morphine injection, naloxone, ondansetron **OR** ondansetron (ZOFRAN) IV, polyethylene glycol  Diet Orders (From admission, onward)    Start     Ordered   05/25/20 1721  Diet regular Room service appropriate? Yes; Fluid consistency: Thin  Diet effective now       Question Answer Comment  Room service appropriate? Yes   Fluid consistency: Thin      05/25/20 1720          DVT prophylaxis: SCDs Start: 05/25/20 1721 SCDs Start: 05/25/20 0248 apixaban (ELIQUIS) tablet 5 mg     Code Status: Full Code  Family Communication: daughter at bedside   Status is: Inpatient  Remains inpatient appropriate because:IV treatments appropriate due to intensity of illness or inability to take PO and Inpatient level of care appropriate due to severity of illness   Dispo: The patient is from: Home              Anticipated d/c is to: CIR  Patient currently is not medically stable to d/c.   Difficult to place patient No  Level of care: Med-Surg  Consultants:  Orthopedic surgery   Procedures:  5/3 - cephalomedullary nail of the left intertrochanteric hip fracture  Microbiology  none  Antimicrobials: none    Objective: Vitals:   05/28/20 0917 05/28/20 1510 05/28/20 2017 05/29/20 0356  BP: 120/64 (!) 125/46 (!) 147/54 134/75  Pulse: 62 73 69 62  Resp: 16 16 16 15   Temp: 98.2 F (36.8 C)  97.8 F (36.6 C) 98.2 F (36.8 C) 98.1 F (36.7 C)  TempSrc: Oral Oral Oral Oral  SpO2: 100% 98% 98% 94%  Weight:      Height:        Intake/Output Summary (Last 24 hours) at 05/29/2020 1019 Last data filed at 05/29/2020 0500 Gross per 24 hour  Intake 240 ml  Output --  Net 240 ml   Filed Weights   05/25/20 0030  Weight: 93.4 kg    Examination:  Constitutional: NAD Eyes: no scleral icterus ENMT: Mucous membranes are moist.  Neck: normal, supple Respiratory: clear to auscultation bilaterally, no wheezing, no crackles. Normal respiratory effort.  Cardiovascular: Regular rate and rhythm, no murmurs / rubs / gallops. Trace edema Abdomen: non distended, no tenderness. Bowel sounds positive.  Musculoskeletal: no clubbing / cyanosis.  Skin: no rashes Neurologic: non focal    Data Reviewed: I have independently reviewed following labs and imaging studies   CBC: Recent Labs  Lab 05/25/20 0028 05/25/20 0200 05/26/20 0158 05/26/20 0824 05/27/20 0324 05/28/20 0127 05/29/20 0147  WBC 6.7  --  7.8  --  4.2 3.5* 3.3*  NEUTROABS 5.5  --  6.2  --  2.9 2.1  --   HGB 13.3   < > 9.2* 8.6* 7.4* 8.7* 8.9*  HCT 41.2   < > 28.3* 25.8* 22.6* 26.1* 26.6*  MCV 96.9  --  97.3  --  97.4 93.2 94.0  PLT 121*  --  122*  --  PLATELET CLUMPS NOTED ON SMEAR, UNABLE TO ESTIMATE 85* 94*   < > = values in this interval not displayed.   Basic Metabolic Panel: Recent Labs  Lab 05/25/20 0154 05/25/20 0200 05/26/20 0158 05/27/20 0324 05/28/20 0127  NA 140 141 140 138 138  K 3.9 4.3 4.3 3.8 4.0  CL 102 103 104 102 104  CO2 28  --  28 30 31   GLUCOSE 178* 180* 137* 125* 117*  BUN 19 22 24* 20 18  CREATININE 1.05* 0.90 1.17* 0.99 0.82  CALCIUM 9.1  --  8.0* 7.8* 8.0*   Liver Function Tests: No results for input(s): AST, ALT, ALKPHOS, BILITOT, PROT, ALBUMIN in the last 168 hours. Coagulation Profile: No results for input(s): INR, PROTIME in the last 168 hours. HbA1C: No results for  input(s): HGBA1C in the last 72 hours. CBG: No results for input(s): GLUCAP in the last 168 hours.  Recent Results (from the past 240 hour(s))  Resp Panel by RT-PCR (Flu A&B, Covid) Nasopharyngeal Swab     Status: None   Collection Time: 05/25/20 12:29 AM   Specimen: Nasopharyngeal Swab; Nasopharyngeal(NP) swabs in vial transport medium  Result Value Ref Range Status   SARS Coronavirus 2 by RT PCR NEGATIVE NEGATIVE Final    Comment: (NOTE) SARS-CoV-2 target nucleic acids are NOT DETECTED.  The SARS-CoV-2 RNA is generally detectable in upper respiratory specimens during the acute phase of infection. The lowest concentration of SARS-CoV-2 viral copies this assay can detect  is 138 copies/mL. A negative result does not preclude SARS-Cov-2 infection and should not be used as the sole basis for treatment or other patient management decisions. A negative result may occur with  improper specimen collection/handling, submission of specimen other than nasopharyngeal swab, presence of viral mutation(s) within the areas targeted by this assay, and inadequate number of viral copies(<138 copies/mL). A negative result must be combined with clinical observations, patient history, and epidemiological information. The expected result is Negative.  Fact Sheet for Patients:  EntrepreneurPulse.com.au  Fact Sheet for Healthcare Providers:  IncredibleEmployment.be  This test is no t yet approved or cleared by the [place] FDA and  has been authorized for detection and/or diagnosis of SARS-CoV-2 by FDA under an Emergency Use Authorization (EUA). This EUA will remain  in effect (meaning this test can be used) for the duration of the COVID-19 declaration under Section 564(b)(1) of the Act, 21 U.S.C.section 360bbb-3(b)(1), unless the authorization is terminated  or revoked sooner.       Influenza A by PCR NEGATIVE NEGATIVE Final   Influenza B by PCR NEGATIVE  NEGATIVE Final    Comment: (NOTE) The Xpert Xpress SARS-CoV-2/FLU/RSV plus assay is intended as an aid in the diagnosis of influenza from Nasopharyngeal swab specimens and should not be used as a sole basis for treatment. Nasal washings and aspirates are unacceptable for Xpert Xpress SARS-CoV-2/FLU/RSV testing.  Fact Sheet for Patients: EntrepreneurPulse.com.au  Fact Sheet for Healthcare Providers: IncredibleEmployment.be  This test is not yet approved or cleared by the [place] FDA and has been authorized for detection and/or diagnosis of SARS-CoV-2 by FDA under an Emergency Use Authorization (EUA). This EUA will remain in effect (meaning this test can be used) for the duration of the COVID-19 declaration under Section 564(b)(1) of the Act, 21 U.S.C. section 360bbb-3(b)(1), unless the authorization is terminated or revoked.  Performed at [facility], [place] [address] [place], [place] [address]   Surgical pcr screen     Status: None   Collection Time: 05/25/20  9:09 AM   Specimen: Nasal Mucosa; Nasal Swab  Result Value Ref Range Status   MRSA, PCR NEGATIVE NEGATIVE Final   Staphylococcus aureus NEGATIVE NEGATIVE Final    Comment: (NOTE) The Xpert SA Assay (FDA approved for NASAL specimens in patients 85 years of age and older), is one component of a comprehensive surveillance program. It is not intended to diagnose infection nor to guide or monitor treatment. Performed at [facility], [place] [address], [place], [place] [address]      Radiology Studies: No results found.   Marzetta Board, MD, PhD [facility]  Between 7 am - 7 pm I am available, please contact me via Amion (for emergencies) or Securechat (non urgent messages)  Between 7 pm - 7 am I am not available, please contact night coverage MD/APP via Amion

## 2020-05-29 NOTE — Plan of Care (Signed)

## 2020-05-30 LAB — COMPREHENSIVE METABOLIC PANEL
ALT: 13 U/L (ref 0–44)
AST: 24 U/L (ref 15–41)
Albumin: 3.2 g/dL — ABNORMAL LOW (ref 3.5–5.0)
Alkaline Phosphatase: 30 U/L — ABNORMAL LOW (ref 38–126)
Anion gap: 7 (ref 5–15)
BUN: 19 mg/dL (ref 8–23)
CO2: 27 mmol/L (ref 22–32)
Calcium: 8.5 mg/dL — ABNORMAL LOW (ref 8.9–10.3)
Chloride: 102 mmol/L (ref 98–111)
Creatinine, Ser: 0.77 mg/dL (ref 0.44–1.00)
GFR, Estimated: >60 mL/min (ref >60)
Glucose, Bld: 114 mg/dL — ABNORMAL HIGH (ref 70–99)
Potassium: 4.3 mmol/L (ref 3.5–5.1)
Sodium: 136 mmol/L (ref 135–145)
Total Bilirubin: 1 mg/dL (ref 0.3–1.2)
Total Protein: 4.9 g/dL — ABNORMAL LOW (ref 6.5–8.1)

## 2020-05-30 LAB — CBC
HCT: 27.6 % — ABNORMAL LOW (ref 36.0–46.0)
Hemoglobin: 8.9 g/dL — ABNORMAL LOW (ref 12.0–15.0)
MCH: 31.1 pg (ref 26.0–34.0)
MCHC: 32.2 g/dL (ref 30.0–36.0)
MCV: 96.5 fL (ref 80.0–100.0)
Platelets: 111 10*3/uL — ABNORMAL LOW (ref 150–400)
RBC: 2.86 MIL/uL — ABNORMAL LOW (ref 3.87–5.11)
RDW: 14.6 % (ref 11.5–15.5)
WBC: 3.4 10*3/uL — ABNORMAL LOW (ref 4.0–10.5)
nRBC: 0.6 % — ABNORMAL HIGH (ref 0.0–0.2)

## 2020-05-30 MED ORDER — OXYCODONE HCL 5 MG PO TABS
5.0000 mg | ORAL_TABLET | ORAL | Status: DC | PRN
  Administered 2020-05-30 – 2020-06-03 (×15): 10 mg via ORAL
  Filled 2020-05-30 (×15): qty 2

## 2020-05-30 NOTE — Social Work (Signed)
To Whom It May Concern:  Please be advised that the above-named patient will require a short-term nursing home stay - anticipated 30 days or less for rehabilitation and strengthening.  The plan is for return home.  

## 2020-05-30 NOTE — Progress Notes (Signed)
Inpatient Rehab Admissions Coordinator:    I met with pt.'s daughter to discuss potential [facility] admit. Unfortunately, I do not believe insurance will approve CIR admission for a hip fracture. Family states understanding and is interested in SNF. CIR will sign off.   Clemens Catholic, [place], [place] Admissions Coordinator  [phone] ([place]) [phone] (office)

## 2020-05-30 NOTE — Plan of Care (Signed)

## 2020-05-30 NOTE — TOC Progression Note (Addendum)
Transition of Care [facility]) - Progression Note    Patient Details  Name: Amanda Sellers MRN: [id number] Date of Birth: 16-Oct-1932  Transition of Care [facility]) CM/SW Contact  Elliot Gurney [place], [place] Phone Number: [phone] 05/30/2020, 2:10 PM  Clinical Narrative:    Phone call from Inpatient Rehab Admissions Coordinator stating that she does not feel that insurance will approve patient for CIR. Family agreeable to SNF. This Education officer, museum spoke with patient's daughter Jones Broom who verbalized no specific preference but did discuss the possibility of [facility] or any other facility in the local area. Fl2 faxed out. PASRR pending.  Transition of Care will continue to follow up.   Duayne Brideau, LCSW Transitions of Care [phone]    Expected Discharge Plan: [place] Barriers to Discharge: Continued Medical Work up  Expected Discharge Plan and Services Expected Discharge Plan: [place]   Discharge Planning Services: CM Consult Post Acute Care Choice: [place] arrangements for the past 2 months: Single Family Home                 DME Arranged: Hospital bed,Lightweight manual wheelchair with seat cushion,Other see comment (hoyer lift) DME Agency: [employer] Date DME Agency Contacted: 05/28/20 Time DME Agency Contacted: 5625 Representative spoke with at DME Agency: [place]: Nurse's Aide,PT,OT [place] Agency: [place] Date [place]: 05/28/20 Time [place]: 46 Representative spoke with at [place]: [place] ([place]) Interventions    Readmission Risk Interventions No flowsheet data found.

## 2020-05-30 NOTE — Progress Notes (Addendum)
PROGRESS NOTE  Massa Pe Mcgranahan HUD:[id number] DOB: 1932/05/10 DOA: 05/25/2020 PCP: Tammi Sou, MD   LOS: 5 days   Brief Narrative / Interim history: 87 year old with history of A. fib on Eliquis, diastolic CHF, HTN came into the hospital after falling down after she experienced balance problems.  She had severe left hip pain following the fall, x-ray showed hip fracture, orthopedic consulted and she is status postrepair on 5/3.  Subjective / 24h Interval events: Still complaining of severe pain at the surgical site.  No longer confused this morning.  Daughter is at bedside and tells me that the nurse rolled her over to change the pads and she was in excruciating pain  Assessment & Plan: Principal Problem Intertrochanteric left femur fracture-after fall from losing her balance.  Orthopedic surgery consulted and followed patient, on 5/3 she underwent cephalomedullary nail of the left intertrochanteric hip fracture.  Continue pain control, she continues to be in severe pain -PT recommended SNF, however patient and her daughter do not want SNF but would like to pursue inpatient rehab, do not feel like they will be able to take care of her at home currently.  Consulted CIR, probably decision on Monday -Continues to have significant pain but will try to use oral pain medications instead of going straight to the IV  Active Problems Acute blood loss anemia, iron deficiency-hemoglobin stable, received a total of 1 unit of packed red blood cells for hemoglobin of 7.7.  Continue Eliquis for A. fib, no bleeding -Continue iron -Hemoglobin stable this morning  Thrombocytopenia, acute on chronic-platelets stable, monitor, gradually improving  Acute kidney injury on chronic kidney disease stage IIIa-creatinine now normalized  History of systolic CHF, HTN-continue Entresto, Lasix is now on hold.  Appears euvolemic  Essential hypertension-continue Entresto  Hypothyroidism-continue  Synthroid  Paroxysmal A. fib-continue amiodarone, anticoagulated with Eliquis.  Hemoglobin stable  Anxiety-continue home medications  Right hip pain-complains of right hip pain 5/7, plain x-ray with possible subtle fracture over the right ischium, this was followed by CT scan which was without acute findings  Scheduled Meds: . sodium chloride   Intravenous Once  . amiodarone  200 mg Oral Daily  . apixaban  5 mg Oral BID  . docusate sodium  100 mg Oral BID  . feeding supplement  237 mL Oral BID BM  . ferrous sulfate  325 mg Oral BID WC  . HYDROcodone-acetaminophen  1 tablet Oral Q6H  . levothyroxine  100 mcg Oral Q0600  . multivitamin with minerals  1 tablet Oral Daily  . potassium chloride SA  20 mEq Oral Daily  . rosuvastatin  20 mg Oral Daily  . sacubitril-valsartan  1 tablet Oral BID   Continuous Infusions: PRN Meds:.acetaminophen, clonazePAM, metoCLOPramide **OR** metoCLOPramide (REGLAN) injection, morphine injection, naloxone, ondansetron **OR** ondansetron (ZOFRAN) IV, polyethylene glycol  Diet Orders (From admission, onward)    Start     Ordered   05/25/20 1721  Diet regular Room service appropriate? Yes; Fluid consistency: Thin  Diet effective now       Question Answer Comment  Room service appropriate? Yes   Fluid consistency: Thin      05/25/20 1720          DVT prophylaxis: SCDs Start: 05/25/20 1721 SCDs Start: 05/25/20 0248 apixaban (ELIQUIS) tablet 5 mg     Code Status: Full Code  Family Communication: daughter at bedside   Status is: Inpatient  Remains inpatient appropriate because:IV treatments appropriate due to intensity of illness or inability to  take PO and Inpatient level of care appropriate due to severity of illness   Dispo: The patient is from: Home              Anticipated d/c is to: CIR              Patient currently is not medically stable to d/c.   Difficult to place patient No  Level of care: Med-Surg  Consultants:  Orthopedic  surgery   Procedures:  5/3 - cephalomedullary nail of the left intertrochanteric hip fracture  Microbiology  none  Antimicrobials: none    Objective: Vitals:   05/29/20 1100 05/29/20 1948 05/30/20 0451 05/30/20 0950  BP: 130/72 (!) 127/48 (!) 125/52 (!) 128/50  Pulse: 60 64 60 60  Resp: 16 16 16 18   Temp: 98.2 F (36.8 C) 98.3 F (36.8 C) 98.6 F (37 C) 98.3 F (36.8 C)  TempSrc: Oral Oral Oral Oral  SpO2: 95% 99% 98% 93%  Weight:      Height:       No intake or output data in the 24 hours ending 05/30/20 1052 Filed Weights   05/25/20 0030  Weight: 93.4 kg    Examination:  Constitutional: No distress Eyes: No icterus ENMT: mmm.  Neck: normal, supple Respiratory: Clear bilaterally, no wheezing or crackles, normal respiratory effort Cardiovascular: Regular rate and rhythm, no murmurs, trace edema Abdomen: Soft, NT, ND, bowel sounds positive Musculoskeletal: no clubbing / cyanosis.  Skin: No rashes seen Neurologic: No focal deficits  Data Reviewed: I have independently reviewed following labs and imaging studies   CBC: Recent Labs  Lab 05/25/20 0028 05/25/20 0200 05/26/20 0158 05/26/20 0824 05/27/20 0324 05/28/20 0127 05/29/20 0147 05/30/20 0139  WBC 6.7  --  7.8  --  4.2 3.5* 3.3* 3.4*  NEUTROABS 5.5  --  6.2  --  2.9 2.1  --   --   HGB 13.3   < > 9.2* 8.6* 7.4* 8.7* 8.9* 8.9*  HCT 41.2   < > 28.3* 25.8* 22.6* 26.1* 26.6* 27.6*  MCV 96.9  --  97.3  --  97.4 93.2 94.0 96.5  PLT 121*  --  122*  --  PLATELET CLUMPS NOTED ON SMEAR, UNABLE TO ESTIMATE 85* 94* 111*   < > = values in this interval not displayed.   Basic Metabolic Panel: Recent Labs  Lab 05/25/20 0154 05/25/20 0200 05/26/20 0158 05/27/20 0324 05/28/20 0127 05/30/20 0139  NA 140 141 140 138 138 136  K 3.9 4.3 4.3 3.8 4.0 4.3  CL 102 103 104 102 104 102  CO2 28  --  28 30 31 27   GLUCOSE 178* 180* 137* 125* 117* 114*  BUN 19 22 24* 20 18 19   CREATININE 1.05* 0.90 1.17* 0.99 0.82  0.77  CALCIUM 9.1  --  8.0* 7.8* 8.0* 8.5*   Liver Function Tests: Recent Labs  Lab 05/30/20 0139  AST 24  ALT 13  ALKPHOS 30*  BILITOT 1.0  PROT 4.9*  ALBUMIN 3.2*   Coagulation Profile: No results for input(s): INR, PROTIME in the last 168 hours. HbA1C: No results for input(s): HGBA1C in the last 72 hours. CBG: No results for input(s): GLUCAP in the last 168 hours.  Recent Results (from the past 240 hour(s))  Resp Panel by RT-PCR (Flu A&B, Covid) Nasopharyngeal Swab     Status: None   Collection Time: 05/25/20 12:29 AM   Specimen: Nasopharyngeal Swab; Nasopharyngeal(NP) swabs in vial transport medium  Result Value Ref Range  Status   SARS Coronavirus 2 by RT PCR NEGATIVE NEGATIVE Final    Comment: (NOTE) SARS-CoV-2 target nucleic acids are NOT DETECTED.  The SARS-CoV-2 RNA is generally detectable in upper respiratory specimens during the acute phase of infection. The lowest concentration of SARS-CoV-2 viral copies this assay can detect is 138 copies/mL. A negative result does not preclude SARS-Cov-2 infection and should not be used as the sole basis for treatment or other patient management decisions. A negative result may occur with  improper specimen collection/handling, submission of specimen other than nasopharyngeal swab, presence of viral mutation(s) within the areas targeted by this assay, and inadequate number of viral copies(<138 copies/mL). A negative result must be combined with clinical observations, patient history, and epidemiological information. The expected result is Negative.  Fact Sheet for Patients:  EntrepreneurPulse.com.au  Fact Sheet for Healthcare Providers:  IncredibleEmployment.be  This test is no t yet approved or cleared by the [place] FDA and  has been authorized for detection and/or diagnosis of SARS-CoV-2 by FDA under an Emergency Use Authorization (EUA). This EUA will remain  in effect  (meaning this test can be used) for the duration of the COVID-19 declaration under Section 564(b)(1) of the Act, 21 U.S.C.section 360bbb-3(b)(1), unless the authorization is terminated  or revoked sooner.       Influenza A by PCR NEGATIVE NEGATIVE Final   Influenza B by PCR NEGATIVE NEGATIVE Final    Comment: (NOTE) The Xpert Xpress SARS-CoV-2/FLU/RSV plus assay is intended as an aid in the diagnosis of influenza from Nasopharyngeal swab specimens and should not be used as a sole basis for treatment. Nasal washings and aspirates are unacceptable for Xpert Xpress SARS-CoV-2/FLU/RSV testing.  Fact Sheet for Patients: EntrepreneurPulse.com.au  Fact Sheet for Healthcare Providers: IncredibleEmployment.be  This test is not yet approved or cleared by the [place] FDA and has been authorized for detection and/or diagnosis of SARS-CoV-2 by FDA under an Emergency Use Authorization (EUA). This EUA will remain in effect (meaning this test can be used) for the duration of the COVID-19 declaration under Section 564(b)(1) of the Act, 21 U.S.C. section 360bbb-3(b)(1), unless the authorization is terminated or revoked.  Performed at [facility], [place] [address], [place], [place] [address]   Surgical pcr screen     Status: None   Collection Time: 05/25/20  9:09 AM   Specimen: Nasal Mucosa; Nasal Swab  Result Value Ref Range Status   MRSA, PCR NEGATIVE NEGATIVE Final   Staphylococcus aureus NEGATIVE NEGATIVE Final    Comment: (NOTE) The Xpert SA Assay (FDA approved for NASAL specimens in patients 92 years of age and older), is one component of a comprehensive surveillance program. It is not intended to diagnose infection nor to guide or monitor treatment. Performed at [facility], [place] [address] [place], [place] [address]      Radiology Studies: CT HIP RIGHT WO CONTRAST  Result Date: 05/29/2020 CLINICAL DATA:  Right hip pain after  falling. Suspected hip fracture. Recent left hip fracture with ORIF. EXAM: CT OF THE RIGHT HIP WITHOUT CONTRAST TECHNIQUE: Multidetector CT imaging of the right hip was performed according to the standard protocol. Multiplanar CT image reconstructions were also generated. COMPARISON:  Radiographs same date. Pelvic and left hip radiographs 05/25/2020. Pelvic CT 11/17/2014. FINDINGS: Bones/Joint/Cartilage No evidence of acute fracture or dislocation. The right femoral head remains located. The right pubic rami appear intact. There are mild right hip degenerative changes and no significant joint effusion. Ligaments Suboptimally  assessed by CT. Muscles and Tendons Mild right hip muscular atrophy without focal hematoma. Mild tendinosis of the gluteus and hamstring tendons. Soft tissues No focal periarticular hematoma or other fluid collection. Mild iliofemoral atherosclerosis. The urinary bladder is noted to be at least moderately distended. IMPRESSION: 1. No evidence of acute right hip fracture or dislocation. The right pubic rami appear intact. 2. No periarticular hematoma. 3. Distended urinary bladder. Electronically Signed   By: Richardean Sale M.D.   On: 05/29/2020 16:53   DG HIP UNILAT WITH PELVIS 2-3 VIEWS RIGHT  Result Date: 05/29/2020 CLINICAL DATA:  Right hip pain.  Recent left hip surgery. EXAM: DG HIP (WITH OR WITHOUT PELVIS) 2-3V RIGHT COMPARISON:  05/25/2020 FINDINGS: Interval fixation of left hip intertrochanteric fracture with hardware intact. Diffuse decreased bone mineralization. Mild degenerative change of the right hip and spine moderate degenerative change of the spine. No acute fracture or dislocation involving the right hip. On the cone-down right hip film, there is suggestion of a subtle lucency with cortical step-off over the right ischium as this may represent artifact versus subtle fracture. IMPRESSION: 1. Possible subtle fracture versus artifact over the right ischium. Consider noncontrast  CT to exclude subtle fracture. 2. Left hip fracture post fixation with hardware intact. 3. Degenerative changes of the right hip and spine. Electronically Signed   By: Marin Olp M.D.   On: 05/29/2020 12:47     Marzetta Board, MD, PhD [facility]  Between 7 am - 7 pm I am available, please contact me via Amion (for emergencies) or Securechat (non urgent messages)  Between 7 pm - 7 am I am not available, please contact night coverage MD/APP via Amion

## 2020-05-30 NOTE — NC FL2 (Signed)
[place] LEVEL OF CARE SCREENING TOOL     IDENTIFICATION  Patient Name: Amanda Sellers NIOEVOJ Birthdate: July 22, 1932 Sex: female Admission Date (Current Location): 05/25/2020  County and [place] Number:  Herbalist and Address:  [facility] [place], [place] [address] [place], [place] [address]      Provider Number:    Attending Physician Name and Address:  Caren Griffins, MD  Relative Name and Phone Number:  Delaine Lame [phone]    Current Level of Care: SNF Recommended Level of Care: [place] Prior Approval Number:    Date Approved/Denied:   PASRR Number: pending  Discharge Plan: SNF    Current Diagnoses: Patient Active Problem List   Diagnosis Date Noted  . LBBB (left bundle branch block) 05/25/2020  . Fracture, proximal femur, left, closed, initial encounter ([place]) 05/25/2020  . Restrictive lung disease 10/01/2019  . Hypothyroidism 05/21/2019  . Pulmonary hypertension due to left heart disease ([place])   . Coronary artery disease involving native coronary artery of native heart without angina pectoris   . Dilated cardiomyopathy ([place])   . Stomatitis 08/14/2016  . Osteoarthritis 05/08/2016  . Pernicious anemia 06/29/2015  . Abnormal finding on pulmonary function testing 05/10/2015  . History of pernicious anemia 05/10/2015  . Other iron deficiency anemias 05/10/2015  . On amiodarone therapy 02/11/2015  . Anticoagulation adequate, Eliquis 10/16/2014  . Paroxysmal atrial fibrillation ([place]); CHA2DS2Vasc 6 - Eliquis; Amio 100 mg daily 10/15/2014  . Microcytic anemia 08/09/2014  . Positive occult stool blood test 08/08/2014  . Symptomatic anemia 08/07/2014  . Acute on chronic diastolic CHF (congestive heart failure), NYHA class 3 ([place]) 08/07/2014  . Pancytopenia due to antineoplastic chemotherapy ([place]) 08/07/2014  . Follicular lymphoma ([place]) 07/29/2014  . Lymphadenopathy   . Anemia 07/22/2014  . Dyspnea  07/22/2014  . Chronic diastolic CHF (congestive heart failure) ([place]) 07/22/2014  . Hypertension 07/22/2014  . Hyperlipidemia 07/22/2014  . Osteoporosis 03/01/2006    Orientation RESPIRATION BLADDER Height & Weight     Self,Time,Situation,Place  Normal Continent Weight: 205 lb 14.6 oz (93.4 kg) Height:  5\' 6"  (167.6 cm)  BEHAVIORAL SYMPTOMS/MOOD NEUROLOGICAL BOWEL NUTRITION STATUS      Continent    AMBULATORY STATUS COMMUNICATION OF NEEDS Skin   Extensive Assist Verbally Normal                       Personal Care Assistance Level of Assistance  Bathing,Feeding,Dressing Bathing Assistance: Limited assistance Feeding assistance: Independent Dressing Assistance: Limited assistance     Functional Limitations Info  Sight,Hearing,Speech Sight Info: Adequate Hearing Info: Adequate Speech Info: Adequate    SPECIAL CARE FACTORS FREQUENCY  PT (By licensed PT),OT (By licensed OT)     PT Frequency: 5x per week OT Frequency: 5x per week            Contractures Contractures Info: Not present    Additional Factors Info  Code Status Code Status Info: full code             Current Medications (05/30/2020):  This is the current hospital active medication list Current Facility-Administered Medications  Medication Dose Route Frequency Provider Last Rate Last Admin  . 0.9 %  sodium chloride infusion (Manually program via Guardrails IV Fluids)   Intravenous Once Donnamae Jude, MD      . acetaminophen (TYLENOL) tablet 1,000 mg  1,000 mg Oral Q6H PRN Erle Crocker, MD   1,000 mg at  05/26/20 0931  . amiodarone (PACERONE) tablet 200 mg  200 mg Oral Daily Erle Crocker, MD   200 mg at 05/30/20 1004  . apixaban (ELIQUIS) tablet 5 mg  5 mg Oral BID Donnamae Jude, MD   5 mg at 05/30/20 1005  . clonazePAM (KLONOPIN) disintegrating tablet 0.25-0.5 mg  0.25-0.5 mg Oral BID PRN Erle Crocker, MD   0.5 mg at 05/30/20 1144  . docusate sodium (COLACE) capsule 100 mg   100 mg Oral BID Erle Crocker, MD   100 mg at 05/30/20 1004  . feeding supplement (ENSURE ENLIVE / ENSURE PLUS) liquid 237 mL  237 mL Oral BID BM Donnamae Jude, MD   237 mL at 05/30/20 1006  . ferrous sulfate tablet 325 mg  325 mg Oral BID WC Erle Crocker, MD   325 mg at 05/30/20 1006  . levothyroxine (SYNTHROID) tablet 100 mcg  100 mcg Oral Q0600 Erle Crocker, MD   100 mcg at 05/30/20 0606  . metoCLOPramide (REGLAN) tablet 5-10 mg  5-10 mg Oral Q8H PRN Erle Crocker, MD       Or  . metoCLOPramide (REGLAN) injection 5-10 mg  5-10 mg Intravenous Q8H PRN Erle Crocker, MD      . morphine 2 MG/ML injection 1 mg  1 mg Intravenous Q4H PRN Terrilee Croak, MD   1 mg at 05/30/20 0950  . multivitamin with minerals tablet 1 tablet  1 tablet Oral Daily Donnamae Jude, MD   1 tablet at 05/30/20 1004  . naloxone [facility]) injection 0.4 mg  0.4 mg Intravenous PRN Etta Quill, DO      . ondansetron [facility]) tablet 4 mg  4 mg Oral Q6H PRN Erle Crocker, MD       Or  . ondansetron [facility]) injection 4 mg  4 mg Intravenous Q6H PRN Erle Crocker, MD      . oxyCODONE (Oxy IR/ROXICODONE) immediate release tablet 5-10 mg  5-10 mg Oral Q4H PRN Caren Griffins, MD      . polyethylene glycol (MIRALAX / GLYCOLAX) packet 17 g  17 g Oral Daily PRN Terrilee Croak, MD   17 g at 05/30/20 1144  . potassium chloride SA (KLOR-CON) CR tablet 20 mEq  20 mEq Oral Daily Erle Crocker, MD   20 mEq at 05/30/20 1005  . rosuvastatin (CRESTOR) tablet 20 mg  20 mg Oral Daily Erle Crocker, MD   20 mg at 05/30/20 1006  . sacubitril-valsartan (ENTRESTO) 24-26 mg per tablet  1 tablet Oral BID Erle Crocker, MD   1 tablet at 05/30/20 1004     Discharge Medications: Please see discharge summary for a list of discharge medications.  Relevant Imaging Results:  Relevant Lab Results:   Additional Information soc sec # [id number]  [name],  [place]

## 2020-05-31 LAB — BASIC METABOLIC PANEL
Anion gap: 7 (ref 5–15)
BUN: 21 mg/dL (ref 8–23)
CO2: 25 mmol/L (ref 22–32)
Calcium: 8.4 mg/dL — ABNORMAL LOW (ref 8.9–10.3)
Chloride: 103 mmol/L (ref 98–111)
Creatinine, Ser: 0.82 mg/dL (ref 0.44–1.00)
GFR, Estimated: >60 mL/min (ref >60)
Glucose, Bld: 119 mg/dL — ABNORMAL HIGH (ref 70–99)
Potassium: 4.6 mmol/L (ref 3.5–5.1)
Sodium: 135 mmol/L (ref 135–145)

## 2020-05-31 LAB — CBC
HCT: 30.3 % — ABNORMAL LOW (ref 36.0–46.0)
Hemoglobin: 9.8 g/dL — ABNORMAL LOW (ref 12.0–15.0)
MCH: 31.4 pg (ref 26.0–34.0)
MCHC: 32.3 g/dL (ref 30.0–36.0)
MCV: 97.1 fL (ref 80.0–100.0)
Platelets: 121 10*3/uL — ABNORMAL LOW (ref 150–400)
RBC: 3.12 MIL/uL — ABNORMAL LOW (ref 3.87–5.11)
RDW: 15.4 % (ref 11.5–15.5)
WBC: 3.6 10*3/uL — ABNORMAL LOW (ref 4.0–10.5)
nRBC: 0.6 % — ABNORMAL HIGH (ref 0.0–0.2)

## 2020-05-31 NOTE — Progress Notes (Signed)
Physical Therapy Treatment Patient Details Name: Amanda BURTIS MRN: [id number] DOB: [birth year] Today's Date: 05/31/2020    History of Present Illness 87 yo female with onset of fall at home, was brought to hosp with proximal femoral intertrochanteric fracture.  After ORIF was WBAT.  PMHx:  a-fib, Eliquis, CHF, HTN, fall    PT Comments    Pt seated in recliner, attempted standing in sara + mechincal lift.  Pt continues to lack full extension into standing.  Performed weight shifting in lift but unable to progress steps and presents with a shuffling pattern.  Continue to recommend SNF for short term rehab.     Follow Up Recommendations  SNF;Supervision/Assistance - 24 hour;Other (comment) (Family is now agreeable to snf.)     Equipment Recommendations  Wheelchair cushion (measurements PT);Wheelchair (measurements PT);Hospital bed;Other (comment) (hoyer lift / hoyer lift pad/  ems transport home.)    Recommendations for Other Services       Precautions / Restrictions Precautions Precautions: Fall Precaution Comments: RLE pain prior to fall Restrictions Weight Bearing Restrictions: Yes LLE Weight Bearing: Weight bearing as tolerated    Mobility  Bed Mobility               General bed mobility comments: Pt seated in recliner on arrival.    Transfers Overall transfer level: Needs assistance Equipment used:  (sara + sit to stand lift) Transfers: Sit to/from Stand Sit to Stand: Total assist         General transfer comment: Mechanical lift to achieve standing. Performed with emphasis on forward weight shifting and foot placement.  Pt continues to lean posterior and lack hip extension in standing.  Ambulation/Gait Ambulation/Gait assistance:  (Attempted gt in sara + with foot plate removed, however she was able to lift R foot for a step but would only slide L foot forward while PTA moved machine/frame forward.  No true distance achieved.)               Stairs              Wheelchair Mobility    Modified Rankin (Stroke Patients Only)       Balance Overall balance assessment: History of Falls;Needs assistance   Sitting balance-Leahy Scale: Poor       Standing balance-Leahy Scale: Zero Standing balance comment: total assistance to maintain standing.                            Cognition Arousal/Alertness: Awake/alert Behavior During Therapy: WFL for tasks assessed/performed;Anxious Overall Cognitive Status: Within Functional Limits for tasks assessed                                 General Comments: Seemingly anxious with the anticipation of movement/pain, family in the room does a lot of talking for the pt      Exercises      General Comments        Pertinent Vitals/Pain Pain Assessment: 0-10 Pain Score: 6  Pain Location: L hip Pain Descriptors / Indicators: Grimacing;Moaning;Guarding Pain Intervention(s): Monitored during session;Repositioned    Home Living                      Prior Function            PT Goals (current goals can now be found in the care plan section) Acute Rehab  PT Goals Patient Stated Goal: to go home Potential to Achieve Goals: Fair Progress towards PT goals: Progressing toward goals    Frequency    Min 3X/week      PT Plan Current plan remains appropriate    Co-evaluation              AM-PAC PT "6 Clicks" Mobility   Outcome Measure  Help needed turning from your back to your side while in a flat bed without using bedrails?: A Lot Help needed moving from lying on your back to sitting on the side of a flat bed without using bedrails?: A Lot Help needed moving to and from a bed to a chair (including a wheelchair)?: Total Help needed standing up from a chair using your arms (e.g., wheelchair or bedside chair)?: Total Help needed to walk in hospital room?: Total Help needed climbing 3-5 steps with a railing? : Total 6 Click Score: 8     End of Session Equipment Utilized During Treatment: Gait belt Activity Tolerance: Patient limited by pain Patient left: in chair;with call bell/phone within reach;with chair alarm set;with family/visitor present Nurse Communication: Mobility status;Patient requests pain meds PT Visit Diagnosis: Unsteadiness on feet (R26.81);Muscle weakness (generalized) (M62.81);Difficulty in walking, not elsewhere classified (R26.2)     Time: 1610-9604 PT Time Calculation (min) (ACUTE ONLY): 28 min  Charges:  $Therapeutic Activity: 23-37 mins                     Erasmo Leventhal , PTA Acute Rehabilitation Services Pager [phone] Office [place] 05/31/2020, 1:00 PM

## 2020-05-31 NOTE — Hospital Course (Signed)
87 year old with history of A. fib on Eliquis, diastolic CHF, HTN came into the hospital after falling down after she experienced balance problems.  She had severe left hip pain following the fall, x-ray showed hip fracture, orthopedic consulted and she is status postrepair on 5/3.

## 2020-05-31 NOTE — Progress Notes (Signed)
     Amanda Sellers is a 87 y.o. female   Orthopaedic diagnosis: Status post cephalomedullary nail of left intertrochanteric hip fracture  Subjective: Patient is resting comfortably.  She is able to lay on her operative hip this morning without significant discomfort.  Per her family at bedside she is indicating that she would like to get up and mobilize.  They changed her dressing yesterday for some saturation.  Objectyive: Vitals:   05/30/20 0950 05/30/20 2011  BP: (!) 128/50 (!) 124/44  Pulse: 60 65  Resp: 18 17  Temp: 98.3 F (36.8 C) 98.7 F (37.1 C)  SpO2: 93% 91%     Exam: Awake and alert Respirations even and unlabored No acute distress  Left hip dressing in place.  Minimal saturation.  She tolerates passive motion through the hip.  She has pain with attempted active motion through the hip.  No tenderness palpation about the calf or foot.  Endorses light touch sensation about the foot.  Assessment: Left intertrochanteric hip fracture status post cephalomedullary nail   Plan: She has been slow to mobilize overall.  She did have some right hip pain and x-rays were concerning for possible subtle fracture over the right ischium but CT scan was negative for this.  Likely looking at skilled nursing facility placement.   She will continue to work with physical therapy while in house for mobilization and at least getting up to the bedside chair. Okay to continue Eliquis for A. Fib. Patient will follow up with me in 2 weeks for x-rays and wound check. Okay to change dressing as needed for saturation prior to discharge.   Radene Journey, MD

## 2020-05-31 NOTE — Plan of Care (Signed)

## 2020-05-31 NOTE — TOC Progression Note (Addendum)
Transition of Care [facility]) - Progression Note    Patient Details  Name: CLAUDELL RHODY MRN: [id number] Date of Birth: 30-Apr-1932  Transition of Care [facility]) CM/SW Contact  Milinda Antis, [place] Phone Number: 05/31/2020, 1:43 PM  Clinical Narrative:    CSW spoke with the family about preference for SNF.  SW was informed that they would prefer agencies in [place].  [facility], [place], and [place] were mentioned.  CSW faxed the referral to agencies in [place].  [facility] would not be an option as they are an assisted living facility.  15:55-  CSW presented bed offers and is waiting for the family's preference.   Expected Discharge Plan: [place] Barriers to Discharge: Continued Medical Work up  Expected Discharge Plan and Services Expected Discharge Plan: [place]   Discharge Planning Services: CM Consult Post Acute Care Choice: [place] arrangements for the past 2 months: Single Family Home                 DME Arranged: Hospital bed,Lightweight manual wheelchair with seat cushion,Other see comment (hoyer lift) DME Agency: [employer] Date DME Agency Contacted: 05/28/20 Time DME Agency Contacted: [phone] Representative spoke with at DME Agency: [place]: Nurse's Aide,PT,OT [place] Agency: [place] Date [place]: 05/28/20 Time [place]: 40 Representative spoke with at [place]: [place] ([place]) Interventions    Readmission Risk Interventions No flowsheet data found.

## 2020-05-31 NOTE — NC FL2 (Signed)
[place] LEVEL OF CARE SCREENING TOOL     IDENTIFICATION  Patient Name: Amanda Sellers [id number] Birthdate: 07-29-1932 Sex: female Admission Date (Current Location): 05/25/2020  County and [place] Number:  Herbalist and Address:  [facility], [place] [address] [place], [place] [address]      Provider Number:    Attending Physician Name and Address:  Ezekiel Slocumb, DO  Relative Name and Phone Number:  Delaine Lame [phone]    Current Level of Care: Hospital Recommended Level of Care: [place] Prior Approval Number:    Date Approved/Denied:   PASRR Number: pending  Discharge Plan: SNF    Current Diagnoses: Patient Active Problem List   Diagnosis Date Noted  . LBBB (left bundle branch block) 05/25/2020  . Fracture, proximal femur, left, closed, initial encounter ([place]) 05/25/2020  . Restrictive lung disease 10/01/2019  . Hypothyroidism 05/21/2019  . Pulmonary hypertension due to left heart disease (Home)   . Coronary artery disease involving native coronary artery of native heart without angina pectoris   . Dilated cardiomyopathy ([place])   . Stomatitis 08/14/2016  . Osteoarthritis 05/08/2016  . Pernicious anemia 06/29/2015  . Abnormal finding on pulmonary function testing 05/10/2015  . History of pernicious anemia 05/10/2015  . Other iron deficiency anemias 05/10/2015  . On amiodarone therapy 02/11/2015  . Anticoagulation adequate, Eliquis 10/16/2014  . Paroxysmal atrial fibrillation ([place]); CHA2DS2Vasc 6 - Eliquis; Amio 100 mg daily 10/15/2014  . Microcytic anemia 08/09/2014  . Positive occult stool blood test 08/08/2014  . Symptomatic anemia 08/07/2014  . Acute on chronic diastolic CHF (congestive heart failure), NYHA class 3 ([place]) 08/07/2014  . Pancytopenia due to antineoplastic chemotherapy ([place]) 08/07/2014  . Follicular lymphoma ([place]) 07/29/2014  . Lymphadenopathy   . Anemia 07/22/2014  .  Dyspnea 07/22/2014  . Chronic diastolic CHF (congestive heart failure) ([place]) 07/22/2014  . Hypertension 07/22/2014  . Hyperlipidemia 07/22/2014  . Osteoporosis 03/01/2006    Orientation RESPIRATION BLADDER Height & Weight     Self,Time,Situation,Place  Normal Continent Weight: 205 lb 14.6 oz (93.4 kg) Height:  5\' 6"  (167.6 cm)  BEHAVIORAL SYMPTOMS/MOOD NEUROLOGICAL BOWEL NUTRITION STATUS      Continent Diet (see d/c summary)  AMBULATORY STATUS COMMUNICATION OF NEEDS Skin   Extensive Assist Verbally Normal                       Personal Care Assistance Level of Assistance  Bathing,Feeding,Dressing Bathing Assistance: Limited assistance Feeding assistance: Independent Dressing Assistance: Limited assistance     Functional Limitations Info  Sight,Hearing,Speech Sight Info: Adequate Hearing Info: Adequate Speech Info: Adequate    SPECIAL CARE FACTORS FREQUENCY  OT (By licensed OT)     PT Frequency: 5x/week OT Frequency: 5x/week            Contractures Contractures Info: Not present    Additional Factors Info  Code Status,Allergies Code Status Info: Full Allergies Info: Aspirin,  Iodinated Diagnostic Agents           Current Medications (05/31/2020):  This is the current hospital active medication list Current Facility-Administered Medications  Medication Dose Route Frequency Provider Last Rate Last Admin  . 0.9 %  sodium chloride infusion (Manually program via Guardrails IV Fluids)   Intravenous Once Donnamae Jude, MD      . acetaminophen (TYLENOL) tablet 1,000 mg  1,000 mg Oral Q6H PRN Erle Crocker, MD   1,000 mg at 05/26/20  6010  . amiodarone (PACERONE) tablet 200 mg  200 mg Oral Daily Erle Crocker, MD   200 mg at 05/31/20 1021  . apixaban (ELIQUIS) tablet 5 mg  5 mg Oral BID Donnamae Jude, MD   5 mg at 05/31/20 1020  . clonazePAM (KLONOPIN) disintegrating tablet 0.25-0.5 mg  0.25-0.5 mg Oral BID PRN Erle Crocker, MD   0.5 mg at  05/30/20 1144  . docusate sodium (COLACE) capsule 100 mg  100 mg Oral BID Erle Crocker, MD   100 mg at 05/31/20 1020  . feeding supplement (ENSURE ENLIVE / ENSURE PLUS) liquid 237 mL  237 mL Oral BID BM Donnamae Jude, MD   237 mL at 05/31/20 1021  . ferrous sulfate tablet 325 mg  325 mg Oral BID WC Erle Crocker, MD   325 mg at 05/31/20 1658  . levothyroxine (SYNTHROID) tablet 100 mcg  100 mcg Oral Q0600 Erle Crocker, MD   100 mcg at 05/31/20 0640  . metoCLOPramide (REGLAN) tablet 5-10 mg  5-10 mg Oral Q8H PRN Erle Crocker, MD       Or  . metoCLOPramide (REGLAN) injection 5-10 mg  5-10 mg Intravenous Q8H PRN Erle Crocker, MD      . morphine 2 MG/ML injection 1 mg  1 mg Intravenous Q4H PRN Terrilee Croak, MD   1 mg at 05/30/20 0950  . multivitamin with minerals tablet 1 tablet  1 tablet Oral Daily Donnamae Jude, MD   1 tablet at 05/31/20 1020  . naloxone [facility]) injection 0.4 mg  0.4 mg Intravenous PRN Etta Quill, DO      . ondansetron [facility]) tablet 4 mg  4 mg Oral Q6H PRN Erle Crocker, MD       Or  . ondansetron [facility]) injection 4 mg  4 mg Intravenous Q6H PRN Erle Crocker, MD      . oxyCODONE (Oxy IR/ROXICODONE) immediate release tablet 5-10 mg  5-10 mg Oral Q4H PRN Caren Griffins, MD   10 mg at 05/31/20 1550  . polyethylene glycol (MIRALAX / GLYCOLAX) packet 17 g  17 g Oral Daily PRN Terrilee Croak, MD   17 g at 05/30/20 1144  . potassium chloride SA (KLOR-CON) CR tablet 20 mEq  20 mEq Oral Daily Erle Crocker, MD   20 mEq at 05/31/20 1020  . rosuvastatin (CRESTOR) tablet 20 mg  20 mg Oral Daily Erle Crocker, MD   20 mg at 05/31/20 1020  . sacubitril-valsartan (ENTRESTO) 24-26 mg per tablet  1 tablet Oral BID Erle Crocker, MD   1 tablet at 05/31/20 1020     Discharge Medications: Please see discharge summary for a list of discharge medications.  Relevant Imaging Results:  Relevant Lab  Results:   Additional Information soc sec # [id number]  Milinda Antis, LCSWA

## 2020-05-31 NOTE — Progress Notes (Signed)
PROGRESS NOTE    Amanda Sellers   [id number]  DOB: [birth year]  PCP: Tammi Sou, MD    DOA: 05/25/2020 LOS: 6   Brief Narrative   87 year old with history of A. fib on Eliquis, diastolic CHF, HTN came into the hospital after falling down after she experienced balance problems.  She had severe left hip pain following the fall, x-ray showed hip fracture, orthopedic consulted and she is status postrepair on 5/3.   Assessment & Plan   Principal Problem:   Fracture, proximal femur, left, closed, initial encounter ([place]) Active Problems:   Chronic diastolic CHF (congestive heart failure) (HCC)   Hypertension   Paroxysmal atrial fibrillation ([place]); CHA2DS2Vasc 6 - Eliquis; Amio 100 mg daily   Anticoagulation adequate, Eliquis   LBBB (left bundle branch block)    Intertrochanteric left femur fracture- After fall from losing her balance.   Orthopedic surgery consulted and followed patient, on 5/3 she underwent cephalomedullary nail of the left intertrochanteric hip fracture.   --Pain control per orders --SNF recommended, daughter requested CIR evaluation. Prior attending placed consult. Continue pain control, she continues to be in severe pain   Acute blood loss anemia, iron deficiency- hemoglobin stable, received a 1 unit pRBCs for Hbg 7.7.  Continue Eliquis for A. fib, no bleeding -Continue iron -Follow CBC  Thrombocytopenia, acute on chronic-platelets stable. Monitor CBC  AKI superimposed on CKD stage IIIa-AKI resolved. Monitor renal function.  Chronic systolic CHF-continue Entresto, Lasix is now on hold.  Appears euvolemic and well-compensated.  Essential hypertension-continue Entresto  Hypothyroidism-continue Synthroid  Paroxysmal A. fib-continue amiodarone, Eliquis.    Anxiety-continue home medications  Right hip pain- c/o of right hip pain 5/7, plain x-ray with possible subtle fracture over the right ischium, this was followed by CT scan which  was without acute findings  Obesity: Body mass index is 33.23 kg/m.  Complicates overall care and prognosis.  Recommend lifestyle modifications including physical activity and diet for weight loss and overall long-term health.   DVT prophylaxis: SCDs Start: 05/25/20 1721 SCDs Start: 05/25/20 0248 apixaban (ELIQUIS) tablet 5 mg   Diet:  Diet Orders (From admission, onward)    Start     Ordered   05/25/20 1721  Diet regular Room service appropriate? Yes; Fluid consistency: Thin  Diet effective now       Question Answer Comment  Room service appropriate? Yes   Fluid consistency: Thin      05/25/20 1720            Code Status: Full Code    Subjective 05/31/20    Pt seen with daughter at bedside today.  Pt reports wanting to get better, "doing all I can".  Not having significant pain at rest currently.  No fever/chills, CP, SOB, N/V or other acute complaints.    Disposition Plan & Communication   Status is: Inpatient  Inpatient appropriate due to severity of illness and unsafe d/c until SNF or CIR placement obtained.  Dispo: The patient is from: home              Anticipated d/c is to: SNF vs CIR              Patient currently not medically stable for d/c   Difficult to place patient no   Family Communication: daughter at bedside on rounds today   Consults, Procedures, Significant Events   Consultants:   Ortho  Procedures:   IM nailing / ORIF of fracture  Antimicrobials:  Anti-infectives (From  admission, onward)   Start     Dose/Rate Route Frequency Ordered Stop   05/25/20 2100  ceFAZolin (ANCEF) 1 g in sodium chloride 0.9 % 100 mL IVPB        1 g 200 mL/hr over 30 Minutes Intravenous Every 6 hours 05/25/20 1720 05/26/20 0914   05/25/20 1345  ceFAZolin (ANCEF) IVPB 2g/100 mL premix        2 g 200 mL/hr over 30 Minutes Intravenous On call to O.R. 05/25/20 1335 05/25/20 1454        Micro    Objective   Vitals:   05/30/20 2011 05/31/20 1022 05/31/20  1043 05/31/20 1526  BP: (!) 124/44 (!) 132/47  (!) 117/54  Pulse: 65 (!) 58  70  Resp: 17 16  17   Temp: 98.7 F (37.1 C) (!) 97.3 F (36.3 C)  (!) 97.5 F (36.4 C)  TempSrc: Oral Oral    SpO2: 91% 100% 100% 96%  Weight:      Height:        Intake/Output Summary (Last 24 hours) at 05/31/2020 1612 Last data filed at 05/31/2020 0600 Gross per 24 hour  Intake --  Output 400 ml  Net -400 ml   Filed Weights   05/25/20 0030  Weight: 93.4 kg    Physical Exam:  General exam: awake, alert, no acute distress Respiratory system: CTAB, diminished bases, no wheezes, rales or rhonchi, normal respiratory effort. Cardiovascular system: normal S1/S2, RRR, no pedal edema.   Gastrointestinal system: soft, NT, ND Central nervous system: A&O x3. no gross focal neurologic deficits, normal speech Skin: dry, intact, normal temperature Psychiatry: normal mood, congruent affect, judgement and insight appear normal  Labs   Data Reviewed: I have personally reviewed following labs and imaging studies  CBC: Recent Labs  Lab 05/25/20 0028 05/25/20 0200 05/26/20 0158 05/26/20 0824 05/27/20 0324 05/28/20 0127 05/29/20 0147 05/30/20 0139 05/31/20 0155  WBC 6.7  --  7.8  --  4.2 3.5* 3.3* 3.4* 3.6*  NEUTROABS 5.5  --  6.2  --  2.9 2.1  --   --   --   HGB 13.3   < > 9.2*   < > 7.4* 8.7* 8.9* 8.9* 9.8*  HCT 41.2   < > 28.3*   < > 22.6* 26.1* 26.6* 27.6* 30.3*  MCV 96.9  --  97.3  --  97.4 93.2 94.0 96.5 97.1  PLT 121*  --  122*  --  PLATELET CLUMPS NOTED ON SMEAR, UNABLE TO ESTIMATE 85* 94* 111* 121*   < > = values in this interval not displayed.   Basic Metabolic Panel: Recent Labs  Lab 05/26/20 0158 05/27/20 0324 05/28/20 0127 05/30/20 0139 05/31/20 0155  NA 140 138 138 136 135  K 4.3 3.8 4.0 4.3 4.6  CL 104 102 104 102 103  CO2 28 30 31 27 25   GLUCOSE 137* 125* 117* 114* 119*  BUN 24* 20 18 19 21   CREATININE 1.17* 0.99 0.82 0.77 0.82  CALCIUM 8.0* 7.8* 8.0* 8.5* 8.4*    GFR: Estimated Creatinine Clearance: 57.7 mL/min (by C-G formula based on SCr of 0.82 mg/dL). Liver Function Tests: Recent Labs  Lab 05/30/20 0139  AST 24  ALT 13  ALKPHOS 30*  BILITOT 1.0  PROT 4.9*  ALBUMIN 3.2*   No results for input(s): LIPASE, AMYLASE in the last 168 hours. No results for input(s): AMMONIA in the last 168 hours. Coagulation Profile: No results for input(s): INR, PROTIME in the last 168  hours. Cardiac Enzymes: No results for input(s): CKTOTAL, CKMB, CKMBINDEX, TROPONINI in the last 168 hours. BNP (last 3 results) No results for input(s): PROBNP in the last 8760 hours. HbA1C: No results for input(s): HGBA1C in the last 72 hours. CBG: No results for input(s): GLUCAP in the last 168 hours. Lipid Profile: No results for input(s): CHOL, HDL, LDLCALC, TRIG, CHOLHDL, LDLDIRECT in the last 72 hours. Thyroid Function Tests: No results for input(s): TSH, T4TOTAL, FREET4, T3FREE, THYROIDAB in the last 72 hours. Anemia Panel: No results for input(s): VITAMINB12, FOLATE, FERRITIN, TIBC, IRON, RETICCTPCT in the last 72 hours. Sepsis Labs: No results for input(s): PROCALCITON, LATICACIDVEN in the last 168 hours.  Recent Results (from the past 240 hour(s))  Resp Panel by RT-PCR (Flu A&B, Covid) Nasopharyngeal Swab     Status: None   Collection Time: 05/25/20 12:29 AM   Specimen: Nasopharyngeal Swab; Nasopharyngeal(NP) swabs in vial transport medium  Result Value Ref Range Status   SARS Coronavirus 2 by RT PCR NEGATIVE NEGATIVE Final    Comment: (NOTE) SARS-CoV-2 target nucleic acids are NOT DETECTED.  The SARS-CoV-2 RNA is generally detectable in upper respiratory specimens during the acute phase of infection. The lowest concentration of SARS-CoV-2 viral copies this assay can detect is 138 copies/mL. A negative result does not preclude SARS-Cov-2 infection and should not be used as the sole basis for treatment or other patient management decisions. A negative  result may occur with  improper specimen collection/handling, submission of specimen other than nasopharyngeal swab, presence of viral mutation(s) within the areas targeted by this assay, and inadequate number of viral copies(<138 copies/mL). A negative result must be combined with clinical observations, patient history, and epidemiological information. The expected result is Negative.  Fact Sheet for Patients:  EntrepreneurPulse.com.au  Fact Sheet for Healthcare Providers:  IncredibleEmployment.be  This test is no t yet approved or cleared by the [place] FDA and  has been authorized for detection and/or diagnosis of SARS-CoV-2 by FDA under an Emergency Use Authorization (EUA). This EUA will remain  in effect (meaning this test can be used) for the duration of the COVID-19 declaration under Section 564(b)(1) of the Act, 21 U.S.C.section 360bbb-3(b)(1), unless the authorization is terminated  or revoked sooner.       Influenza A by PCR NEGATIVE NEGATIVE Final   Influenza B by PCR NEGATIVE NEGATIVE Final    Comment: (NOTE) The Xpert Xpress SARS-CoV-2/FLU/RSV plus assay is intended as an aid in the diagnosis of influenza from Nasopharyngeal swab specimens and should not be used as a sole basis for treatment. Nasal washings and aspirates are unacceptable for Xpert Xpress SARS-CoV-2/FLU/RSV testing.  Fact Sheet for Patients: EntrepreneurPulse.com.au  Fact Sheet for Healthcare Providers: IncredibleEmployment.be  This test is not yet approved or cleared by the [place] FDA and has been authorized for detection and/or diagnosis of SARS-CoV-2 by FDA under an Emergency Use Authorization (EUA). This EUA will remain in effect (meaning this test can be used) for the duration of the COVID-19 declaration under Section 564(b)(1) of the Act, 21 U.S.C. section 360bbb-3(b)(1), unless the authorization is  terminated or revoked.  Performed at [facility], [place] [address], [place], [place] [address]   Surgical pcr screen     Status: None   Collection Time: 05/25/20  9:09 AM   Specimen: Nasal Mucosa; Nasal Swab  Result Value Ref Range Status   MRSA, PCR NEGATIVE NEGATIVE Final   Staphylococcus aureus NEGATIVE NEGATIVE Final    Comment: (NOTE)  The Xpert SA Assay (FDA approved for NASAL specimens in patients 54 years of age and older), is one component of a comprehensive surveillance program. It is not intended to diagnose infection nor to guide or monitor treatment. Performed at [facility], [place] [address], [place], [place] [address]       Imaging Studies   CT HIP RIGHT WO CONTRAST  Result Date: 05/29/2020 CLINICAL DATA:  Right hip pain after falling. Suspected hip fracture. Recent left hip fracture with ORIF. EXAM: CT OF THE RIGHT HIP WITHOUT CONTRAST TECHNIQUE: Multidetector CT imaging of the right hip was performed according to the standard protocol. Multiplanar CT image reconstructions were also generated. COMPARISON:  Radiographs same date. Pelvic and left hip radiographs 05/25/2020. Pelvic CT 11/17/2014. FINDINGS: Bones/Joint/Cartilage No evidence of acute fracture or dislocation. The right femoral head remains located. The right pubic rami appear intact. There are mild right hip degenerative changes and no significant joint effusion. Ligaments Suboptimally assessed by CT. Muscles and Tendons Mild right hip muscular atrophy without focal hematoma. Mild tendinosis of the gluteus and hamstring tendons. Soft tissues No focal periarticular hematoma or other fluid collection. Mild iliofemoral atherosclerosis. The urinary bladder is noted to be at least moderately distended. IMPRESSION: 1. No evidence of acute right hip fracture or dislocation. The right pubic rami appear intact. 2. No periarticular hematoma. 3. Distended urinary bladder. Electronically Signed   By: Richardean Sale  M.D.   On: 05/29/2020 16:53     Medications   Scheduled Meds: . sodium chloride   Intravenous Once  . amiodarone  200 mg Oral Daily  . apixaban  5 mg Oral BID  . docusate sodium  100 mg Oral BID  . feeding supplement  237 mL Oral BID BM  . ferrous sulfate  325 mg Oral BID WC  . levothyroxine  100 mcg Oral Q0600  . multivitamin with minerals  1 tablet Oral Daily  . potassium chloride SA  20 mEq Oral Daily  . rosuvastatin  20 mg Oral Daily  . sacubitril-valsartan  1 tablet Oral BID   Continuous Infusions:     LOS: 6 days    Time spent: 25 minutes    Ezekiel Slocumb, DO [facility]  05/31/2020, 4:12 PM      If 7PM-7AM, please contact night-coverage. How to contact the [facility] Attending or Consulting provider [place] or covering provider during after hours [place], for this patient?    1. Check the care team in [facility] and look for a) attending/consulting TRH provider listed and b) the [facility] team listed 2. Log into [email or web address] and use [place]'s universal password to access. If you do not have the password, please contact the hospital operator. 3. Locate the [facility] provider you are looking for under [facility] and page to a number that you can be directly reached. 4. If you still have difficulty reaching the provider, please page the [facility] (Director on Call) for the Hospitalists listed on amion for assistance.

## 2020-05-31 NOTE — Plan of Care (Signed)

## 2020-06-01 LAB — CBC
HCT: 31 % — ABNORMAL LOW (ref 36.0–46.0)
Hemoglobin: 10.4 g/dL — ABNORMAL LOW (ref 12.0–15.0)
MCH: 31.9 pg (ref 26.0–34.0)
MCHC: 33.5 g/dL (ref 30.0–36.0)
MCV: 95.1 fL (ref 80.0–100.0)
Platelets: 125 10*3/uL — ABNORMAL LOW (ref 150–400)
RBC: 3.26 MIL/uL — ABNORMAL LOW (ref 3.87–5.11)
RDW: 15.6 % — ABNORMAL HIGH (ref 11.5–15.5)
WBC: 4.1 10*3/uL (ref 4.0–10.5)
nRBC: 0 % (ref 0.0–0.2)

## 2020-06-01 LAB — SARS CORONAVIRUS 2 (TAT 6-24 HRS): SARS Coronavirus 2: NEGATIVE

## 2020-06-01 NOTE — Plan of Care (Signed)

## 2020-06-01 NOTE — Plan of Care (Signed)
  Problem: Education: Goal: Knowledge of General Education information will improve Description: Including pain rating scale, medication(s)/side effects and non-pharmacologic comfort measures Outcome: Progressing   Problem: Health Behavior/Discharge Planning: Goal: Ability to manage health-related needs will improve 06/01/2020 0409 by Guinevere Scarlet, RN Outcome: Progressing 05/31/2020 2319 by Guinevere Scarlet, RN Outcome: Progressing   Problem: Clinical Measurements: Goal: Ability to maintain clinical measurements within normal limits will improve 06/01/2020 0409 by Guinevere Scarlet, RN Outcome: Progressing 05/31/2020 2319 by Guinevere Scarlet, RN Outcome: Progressing Goal: Will remain free from infection Outcome: Progressing

## 2020-06-01 NOTE — Plan of Care (Signed)
  Problem: Education: Goal: Knowledge of General Education information will improve Description: Including pain rating scale, medication(s)/side effects and non-pharmacologic comfort measures 06/01/2020 2234 by Guinevere Scarlet, RN Outcome: Progressing 06/01/2020 2233 by Guinevere Scarlet, RN Outcome: Progressing   Problem: Health Behavior/Discharge Planning: Goal: Ability to manage health-related needs will improve 06/01/2020 2234 by Guinevere Scarlet, RN Outcome: Progressing 06/01/2020 2233 by Guinevere Scarlet, RN Outcome: Progressing   Problem: Clinical Measurements: Goal: Ability to maintain clinical measurements within normal limits will improve 06/01/2020 2234 by Guinevere Scarlet, RN Outcome: Progressing 06/01/2020 2233 by Guinevere Scarlet, RN Outcome: Progressing

## 2020-06-01 NOTE — TOC Progression Note (Addendum)
Transition of Care [facility]) - Progression Note    Patient Details  Name: ESLI CLEMENTS MRN: [id number] Date of Birth: 07-Nov-1932  [facility]) CM/SW Contact  Milinda Antis, [place] Phone Number: 06/01/2020, 11:47 AM  Clinical Narrative:    CSW spoke with Mardene Celeste at [facility] [phone] ex. [phone] to inquire about their availability to accept the patient.  CSW was informed that they would extend a bed offer.  Insurance Auth has been started, additional information requested by [facility] was sent.    Family has been notified.  Pending COVID test results, insurance auth, and PASSR number.  14:52- PASSR number received= [id number]  Expected Discharge Plan: [place] Barriers to Discharge: Continued Medical Work up  Expected Discharge Plan and Services Expected Discharge Plan: [place]   Discharge Planning Services: CM Consult Post Acute Care Choice: [place] arrangements for the past 2 months: Single Family Home                 DME Arranged: Hospital bed,Lightweight manual wheelchair with seat cushion,Other see comment (hoyer lift) DME Agency: [employer] Date DME Agency Contacted: 05/28/20 Time DME Agency Contacted: [phone] Representative spoke with at DME Agency: [place]: Nurse's Aide,PT,OT [place] Agency: [place] Date [place]: 05/28/20 Time [place]: 37 Representative spoke with at [place]: [place] ([place]) Interventions    Readmission Risk Interventions No flowsheet data found.

## 2020-06-01 NOTE — Progress Notes (Signed)
PROGRESS NOTE    Amanda Sellers   WNU:[id number]  DOB: Dec 17, 1932  PCP: Tammi Sou, MD    DOA: 05/25/2020 LOS: 7   Brief Narrative   87 year old with history of A. fib on Eliquis, diastolic CHF, HTN came into the hospital after falling down after she experienced balance problems.  She had severe left hip pain following the fall, x-ray showed hip fracture, orthopedic consulted and she is status postrepair on 5/3.   Assessment & Plan   Principal Problem:   Fracture, proximal femur, left, closed, initial encounter ([place]) Active Problems:   Chronic diastolic CHF (congestive heart failure) (HCC)   Hypertension   Paroxysmal atrial fibrillation ([place]); CHA2DS2Vasc 6 - Eliquis; Amio 100 mg daily   Anticoagulation adequate, Eliquis   LBBB (left bundle branch block)    Intertrochanteric left femur fracture- After fall from losing her balance.   Orthopedic surgery consulted and followed patient, on 5/3 she underwent cephalomedullary nail of the left intertrochanteric hip fracture.   Has quite a bit of pain with any mobility. --Pain control per orders --SNF recommended  Acute blood loss anemia, iron deficiency- hemoglobin stable, received a 1 unit pRBCs for Hbg 7.7.  Continue Eliquis for A. fib, no bleeding -Continue iron -Follow CBC  Thrombocytopenia, acute on chronic-platelets stable. Monitor CBC  AKI superimposed on CKD stage IIIa-AKI resolved. Monitor renal function.  Chronic systolic CHF-continue Entresto, Lasix is now on hold.  Appears euvolemic and well-compensated.  Essential hypertension-continue Entresto  Hypothyroidism-continue Synthroid  Paroxysmal A. fib-continue amiodarone, Eliquis.    Anxiety-continue home medications  Right hip pain- c/o of right hip pain 5/7, plain x-ray with possible subtle fracture over the right ischium, this was followed by CT scan which was without acute findings  Obesity: Body mass index is 33.23 kg/m.  Complicates  overall care and prognosis.  Recommend lifestyle modifications including physical activity and diet for weight loss and overall long-term health.   DVT prophylaxis: SCDs Start: 05/25/20 1721 SCDs Start: 05/25/20 0248 apixaban (ELIQUIS) tablet 5 mg   Diet:  Diet Orders (From admission, onward)    Start     Ordered   05/25/20 1721  Diet regular Room service appropriate? Yes; Fluid consistency: Thin  Diet effective now       Question Answer Comment  Room service appropriate? Yes   Fluid consistency: Thin      05/25/20 1720            Code Status: Full Code    Subjective 06/01/20    Pt was up until 3:30 am per her daughter.  Pt sleeping comfortably this AM but wakes easily.  She currently having a little pain.  She up in chair 5 hours yesterday, napped in the chair.     Disposition Plan & Communication   Status is: Inpatient  Inpatient appropriate due to severity of illness and unsafe d/c until SNF bed available and insurance authorized.  Dispo: The patient is from: home              Anticipated d/c is to: SNF              Patient currently not medically stable for d/c   Difficult to place patient no   Family Communication: daughter at bedside on rounds today   Consults, Procedures, Significant Events   Consultants:   Ortho  Procedures:   IM nailing / ORIF of fracture  Antimicrobials:  Anti-infectives (From admission, onward)   Start  Dose/Rate Route Frequency Ordered Stop   05/25/20 2100  ceFAZolin (ANCEF) 1 g in sodium chloride 0.9 % 100 mL IVPB        1 g 200 mL/hr over 30 Minutes Intravenous Every 6 hours 05/25/20 1720 05/26/20 0914   05/25/20 1345  ceFAZolin (ANCEF) IVPB 2g/100 mL premix        2 g 200 mL/hr over 30 Minutes Intravenous On call to O.R. 05/25/20 1335 05/25/20 1454        Micro    Objective   Vitals:   05/31/20 1526 05/31/20 2141 06/01/20 0630 06/01/20 1533  BP: (!) 117/54 (!) 114/55 115/65 (!) 147/63  Pulse: 70 65 (!) 58 63   Resp: 17 18 16 17   Temp: (!) 97.5 F (36.4 C) 97.9 F (36.6 C) 98 F (36.7 C) 98.1 F (36.7 C)  TempSrc:  Oral Oral Oral  SpO2: 96% 100% 100% 98%  Weight:      Height:       No intake or output data in the 24 hours ending 06/01/20 1757 Filed Weights   05/25/20 0030  Weight: 93.4 kg    Physical Exam:  General exam: sleeping but woke easily, drowsy appearing, no acute distress Respiratory system: CTAB, normal respiratory effort, on room air. Cardiovascular system: normal S1/S2, RRR, no pedal edema.   Gastrointestinal system: soft, NT, ND Central nervous system: A&O x3. no gross focal neurologic deficits, normal speech   Labs   Data Reviewed: I have personally reviewed following labs and imaging studies  CBC: Recent Labs  Lab 05/26/20 0158 05/26/20 0824 05/27/20 0324 05/28/20 0127 05/29/20 0147 05/30/20 0139 05/31/20 0155 06/01/20 0121  WBC 7.8  --  4.2 3.5* 3.3* 3.4* 3.6* 4.1  NEUTROABS 6.2  --  2.9 2.1  --   --   --   --   HGB 9.2*   < > 7.4* 8.7* 8.9* 8.9* 9.8* 10.4*  HCT 28.3*   < > 22.6* 26.1* 26.6* 27.6* 30.3* 31.0*  MCV 97.3  --  97.4 93.2 94.0 96.5 97.1 95.1  PLT 122*  --  PLATELET CLUMPS NOTED ON SMEAR, UNABLE TO ESTIMATE 85* 94* 111* 121* 125*   < > = values in this interval not displayed.   Basic Metabolic Panel: Recent Labs  Lab 05/26/20 0158 05/27/20 0324 05/28/20 0127 05/30/20 0139 05/31/20 0155  NA 140 138 138 136 135  K 4.3 3.8 4.0 4.3 4.6  CL 104 102 104 102 103  CO2 28 30 31 27 25   GLUCOSE 137* 125* 117* 114* 119*  BUN 24* 20 18 19 21   CREATININE 1.17* 0.99 0.82 0.77 0.82  CALCIUM 8.0* 7.8* 8.0* 8.5* 8.4*   GFR: Estimated Creatinine Clearance: 57.7 mL/min (by C-G formula based on SCr of 0.82 mg/dL). Liver Function Tests: Recent Labs  Lab 05/30/20 0139  AST 24  ALT 13  ALKPHOS 30*  BILITOT 1.0  PROT 4.9*  ALBUMIN 3.2*   No results for input(s): LIPASE, AMYLASE in the last 168 hours. No results for input(s): AMMONIA in the  last 168 hours. Coagulation Profile: No results for input(s): INR, PROTIME in the last 168 hours. Cardiac Enzymes: No results for input(s): CKTOTAL, CKMB, CKMBINDEX, TROPONINI in the last 168 hours. BNP (last 3 results) No results for input(s): PROBNP in the last 8760 hours. HbA1C: No results for input(s): HGBA1C in the last 72 hours. CBG: No results for input(s): GLUCAP in the last 168 hours. Lipid Profile: No results for input(s): CHOL, HDL, LDLCALC,  TRIG, CHOLHDL, LDLDIRECT in the last 72 hours. Thyroid Function Tests: No results for input(s): TSH, T4TOTAL, FREET4, T3FREE, THYROIDAB in the last 72 hours. Anemia Panel: No results for input(s): VITAMINB12, FOLATE, FERRITIN, TIBC, IRON, RETICCTPCT in the last 72 hours. Sepsis Labs: No results for input(s): PROCALCITON, LATICACIDVEN in the last 168 hours.  Recent Results (from the past 240 hour(s))  Resp Panel by RT-PCR (Flu A&B, Covid) Nasopharyngeal Swab     Status: None   Collection Time: 05/25/20 12:29 AM   Specimen: Nasopharyngeal Swab; Nasopharyngeal(NP) swabs in vial transport medium  Result Value Ref Range Status   SARS Coronavirus 2 by RT PCR NEGATIVE NEGATIVE Final    Comment: (NOTE) SARS-CoV-2 target nucleic acids are NOT DETECTED.  The SARS-CoV-2 RNA is generally detectable in upper respiratory specimens during the acute phase of infection. The lowest concentration of SARS-CoV-2 viral copies this assay can detect is 138 copies/mL. A negative result does not preclude SARS-Cov-2 infection and should not be used as the sole basis for treatment or other patient management decisions. A negative result may occur with  improper specimen collection/handling, submission of specimen other than nasopharyngeal swab, presence of viral mutation(s) within the areas targeted by this assay, and inadequate number of viral copies(<138 copies/mL). A negative result must be combined with clinical observations, patient history, and  epidemiological information. The expected result is Negative.  Fact Sheet for Patients:  EntrepreneurPulse.com.au  Fact Sheet for Healthcare Providers:  IncredibleEmployment.be  This test is no t yet approved or cleared by the [place] FDA and  has been authorized for detection and/or diagnosis of SARS-CoV-2 by FDA under an Emergency Use Authorization (EUA). This EUA will remain  in effect (meaning this test can be used) for the duration of the COVID-19 declaration under Section 564(b)(1) of the Act, 21 U.S.C.section 360bbb-3(b)(1), unless the authorization is terminated  or revoked sooner.       Influenza A by PCR NEGATIVE NEGATIVE Final   Influenza B by PCR NEGATIVE NEGATIVE Final    Comment: (NOTE) The Xpert Xpress SARS-CoV-2/FLU/RSV plus assay is intended as an aid in the diagnosis of influenza from Nasopharyngeal swab specimens and should not be used as a sole basis for treatment. Nasal washings and aspirates are unacceptable for Xpert Xpress SARS-CoV-2/FLU/RSV testing.  Fact Sheet for Patients: EntrepreneurPulse.com.au  Fact Sheet for Healthcare Providers: IncredibleEmployment.be  This test is not yet approved or cleared by the [place] FDA and has been authorized for detection and/or diagnosis of SARS-CoV-2 by FDA under an Emergency Use Authorization (EUA). This EUA will remain in effect (meaning this test can be used) for the duration of the COVID-19 declaration under Section 564(b)(1) of the Act, 21 U.S.C. section 360bbb-3(b)(1), unless the authorization is terminated or revoked.  Performed at [facility], [place] [address], [place], [place] [address]   Surgical pcr screen     Status: None   Collection Time: 05/25/20  9:09 AM   Specimen: Nasal Mucosa; Nasal Swab  Result Value Ref Range Status   MRSA, PCR NEGATIVE NEGATIVE Final   Staphylococcus aureus NEGATIVE NEGATIVE Final     Comment: (NOTE) The Xpert SA Assay (FDA approved for NASAL specimens in patients 29 years of age and older), is one component of a comprehensive surveillance program. It is not intended to diagnose infection nor to guide or monitor treatment. Performed at [facility], [place] [address] [place], [place] [address]   SARS CORONAVIRUS 2 (TAT 6-24 HRS) Nasopharyngeal Nasopharyngeal  Swab     Status: None   Collection Time: 06/01/20 10:43 AM   Specimen: Nasopharyngeal Swab  Result Value Ref Range Status   SARS Coronavirus 2 NEGATIVE NEGATIVE Final    Comment: (NOTE) SARS-CoV-2 target nucleic acids are NOT DETECTED.  The SARS-CoV-2 RNA is generally detectable in upper and lower respiratory specimens during the acute phase of infection. Negative results do not preclude SARS-CoV-2 infection, do not rule out co-infections with other pathogens, and should not be used as the sole basis for treatment or other patient management decisions. Negative results must be combined with clinical observations, patient history, and epidemiological information. The expected result is Negative.  Fact Sheet for Patients: SugarRoll.be  Fact Sheet for Healthcare Providers: https://www.woods-mathews.com/  This test is not yet approved or cleared by the [place] FDA and  has been authorized for detection and/or diagnosis of SARS-CoV-2 by FDA under an Emergency Use Authorization (EUA). This EUA will remain  in effect (meaning this test can be used) for the duration of the COVID-19 declaration under Se ction 564(b)(1) of the Act, 21 U.S.C. section 360bbb-3(b)(1), unless the authorization is terminated or revoked sooner.  Performed at [facility], [place] [address] [place],  [address]       Imaging Studies   No results found.   Medications   Scheduled Meds: . sodium chloride   Intravenous Once  . amiodarone  200 mg Oral Daily  .  apixaban  5 mg Oral BID  . docusate sodium  100 mg Oral BID  . feeding supplement  237 mL Oral BID BM  . ferrous sulfate  325 mg Oral BID WC  . levothyroxine  100 mcg Oral Q0600  . multivitamin with minerals  1 tablet Oral Daily  . potassium chloride SA  20 mEq Oral Daily  . rosuvastatin  20 mg Oral Daily  . sacubitril-valsartan  1 tablet Oral BID   Continuous Infusions:     LOS: 7 days    Time spent: 20 minutes    Ezekiel Slocumb, DO [facility]  06/01/2020, 5:57 PM      If 7PM-7AM, please contact night-coverage. How to contact the [facility] Attending or Consulting provider [place] or covering provider during after hours [place], for this patient?    1. Check the care team in [facility] and look for a) attending/consulting TRH provider listed and b) the [facility] team listed 2. Log into [email or web address] and use [place]'s universal password to access. If you do not have the password, please contact the hospital operator. 3. Locate the [facility] provider you are looking for under [facility] and page to a number that you can be directly reached. 4. If you still have difficulty reaching the provider, please page the [facility] (Director on Call) for the Hospitalists listed on amion for assistance.

## 2020-06-02 NOTE — Plan of Care (Signed)
  Problem: Activity: Goal: Risk for activity intolerance will decrease Outcome: Not Progressing   Problem: Pain Managment: Goal: General experience of comfort will improve Outcome: Progressing   Problem: Skin Integrity: Goal: Risk for impaired skin integrity will decrease Outcome: Not Progressing

## 2020-06-02 NOTE — TOC Progression Note (Addendum)
Transition of Care [facility]) - Progression Note    Patient Details  Name: Amanda Sellers MRN: [id number] Date of Birth: [birth year]  Transition of Care [facility]) CM/SW Contact  Milinda Antis, [place] Phone Number: 06/02/2020, 9:58 AM  Clinical Narrative:    CSW called [employer] to inquire about the status of insurance auth.  Marlowe Kays, RN case manager with the company stated that [place] transportation for the patient has been approved auth number [id number].  The SNF has not been approved because of "the patient's inability to participate in rehab".  A peer to peer was requested.  The attending is to call medical director, Lynder Parents [phone], and give more information about the patient.  9:42- The attending, Dr. Lonny Prude, was sent a secure chat with the information above.  10:30- Family and facility has been notified of the above information.   Expected Discharge Plan: [place] Barriers to Discharge: Continued Medical Work up  Expected Discharge Plan and Services Expected Discharge Plan: [place]   Discharge Planning Services: CM Consult Post Acute Care Choice: [place] arrangements for the past 2 months: Single Family Home                 DME Arranged: Hospital bed,Lightweight manual wheelchair with seat cushion,Other see comment (hoyer lift) DME Agency: [employer] Date DME Agency Contacted: 05/28/20 Time DME Agency Contacted: [phone] Representative spoke with at DME Agency: [place]: Nurse's Aide,PT,OT [place] Agency: [place] [facility] Date [place]: 05/28/20 Time [place]: 79 Representative spoke with at [place]: [place] ([place]) Interventions    Readmission Risk Interventions No flowsheet data found.

## 2020-06-02 NOTE — Discharge Summary (Signed)
Physician Discharge Summary  Amanda Sellers Amanda Sellers IFB:[id number] DOB: 1932/06/10 DOA: 05/25/2020  PCP: Jeoffrey Massed, MD  Admit date: 05/25/2020 Discharge date: 06/03/2020  Admitted From: Home Disposition: SNF  Recommendations for Outpatient Follow-up:  1. Follow up with PCP in 1 week 2. Follow up with orthopedic surgery, Dr Susa Simmonds in 2 weeks 3. Please obtain BMP/CBC in one week 4. Dressing changes as needed to keep area dry 5. Please follow up on the following pending results: None   Discharge Condition: Stable CODE STATUS: Full code Diet recommendation: Heart healthy   Brief/Interim Summary:  Admission HPI written by Hillary Bow, DO   HPI: Amanda Sellers is a 87 y.o. female with medical history significant of A.Fib on Eliquis, dCHF, HTN.  Pt presents to the ED with c/o L hip pain following a mechanical fall.  Severe pain to L hip following fall.  Worse with movement.  Nothing makes it better.  Unable to bear wt.  Associated deformity.  Symptoms constant, persistent, unchanged.  Hospital course:  Intertrochanteric left femur fracture Secondary to fall from losing balance.  Orthopedic surgery was consulted and patient underwent cephalomedullary nail placement. Orthopedic surgery recommending outpatient follow-up in 2 weeks with recommendations for dressing changes as needed for saturation.  Acute blood loss anemia Iron deficiency anemia In setting of surgery as mentioned above.  Patient received 1 unit PRBC for hemoglobin of 7.4.  Posttransfusion hemoglobin of 8.7 which has remained stable.  Thrombocytopenia Patient with chronic thrombocytopenia with transient acute drop. Now stabilized.  Right hip pain X-ray with possible subtle fracture of the right ischium which was confirmed negative on CT scan.  CKD stage IIIa Baseline creatinine of 0.9. Peak creatinine of 1.17. Does not meet criteria for AKI.  Primary hypertension Continue Entresto. Stop amlodipine  secondary to controlled BP; consider restarting if blood pressure worsens.  Chronic combined systolic and diastolic heart failure Continue Entresto and Lasix  Hypothyroidism Continue Synthroid  Paroxysmal atrial fibrillation Continue Eliquis and amiodarone  Hyperlipidemia Continue Crestor  Anxiety Continue Klonopin prn  Obesity Body mass index is 33.23 kg/m.  Discharge Diagnoses:  Principal Problem:   Fracture, proximal femur, left, closed, initial encounter (HCC) Active Problems:   Chronic diastolic CHF (congestive heart failure) (HCC)   Hypertension   Paroxysmal atrial fibrillation (HCC); CHA2DS2Vasc 6 - Eliquis; Amio 100 mg daily   Anticoagulation adequate, Eliquis   LBBB (left bundle branch block)    Discharge Instructions  Discharge Instructions    No dressing needed   Complete by: As directed      Allergies as of 06/03/2020      Reactions   Aspirin Itching   Iodinated Diagnostic Agents Rash, Other (See Comments)   CT Contrast      Medication List    STOP taking these medications   amLODipine 2.5 MG tablet Commonly known as: NORVASC   traMADol 50 MG tablet Commonly known as: ULTRAM     TAKE these medications   acetaminophen 500 MG tablet Commonly known as: TYLENOL Take 1,000 mg by mouth every 6 (six) hours as needed for moderate pain or headache.   amiodarone 200 MG tablet Commonly known as: PACERONE TAKE ONE TABLET BY MOUTH EVERY DAY.   apixaban 5 MG Tabs tablet Commonly known as: Eliquis Take 1 tablet (5 mg total) by mouth 2 (two) times daily.   clonazePAM 0.5 MG tablet Commonly known as: KLONOPIN Take 0.5-1 tablets (0.25-0.5 mg total) by mouth 2 (two) times daily as needed  for anxiety.   cyanocobalamin 1000 MCG/ML injection Commonly known as: (VITAMIN B-12) INJECT 1 ML INTO THE SKIN EVERY 30 DAYS. What changed:   how much to take  how to take this  when to take this  additional instructions   diclofenac Sodium 1 %  Gel Commonly known as: VOLTAREN Apply 2 g topically 4 (four) times daily. Apply to LE joints that are in pain.   Entresto 24-26 MG Generic drug: sacubitril-valsartan Take 1 tablet by mouth 2 (two) times daily.   ferrous sulfate 325 (65 FE) MG tablet Take 1 tablet (325 mg total) by mouth 2 (two) times daily with a meal.   fish oil-omega-3 fatty acids 1000 MG capsule Take 2 g by mouth daily.   fluticasone 0.05 % cream Commonly known as: CUTIVATE Apply to affected area bid prn What changed:   how much to take  how to take this  when to take this  reasons to take this  additional instructions   furosemide 40 MG tablet Commonly known as: LASIX TAKE 1 TABLET BY MOUTH EVERY DAY   levothyroxine 100 MCG tablet Commonly known as: SYNTHROID TAKE 1 TABLET BY MOUTH EVERY DAY BEFORE BREAKFAST What changed: See the new instructions.   oxyCODONE-acetaminophen 5-325 MG tablet Commonly known as: Percocet Take 1-2 tablets by mouth every 4 (four) hours as needed for moderate pain or severe pain.   OXYGEN Inhale into the lungs. 1Lt at bedtime  as needed   polyethylene glycol 17 g packet Commonly known as: MIRALAX / GLYCOLAX Take 17 g by mouth daily.   Potassium Chloride ER 20 MEQ Tbcr TAKE 1 TABLET BY MOUTH EVERY DAY What changed: how much to take   rosuvastatin 20 MG tablet Commonly known as: CRESTOR TAKE 1 TABLET BY MOUTH EVERY DAY   Vitamin D (Ergocalciferol) 1.25 MG (50000 UNIT) Caps capsule Commonly known as: DRISDOL TAKE 1 CAPSULE (50,000 UNITS TOTAL) BY MOUTH 2 (TWO) TIMES A WEEK. What changed: additional instructions            Durable Medical Equipment  (From admission, onward)         Start     Ordered   05/28/20 1335  For home use only DME Hospital bed  Once       Question Answer Comment  Length of Need 6 Months   The above medical condition requires: Patient requires the ability to reposition frequently   Head must be elevated greater than: 30  degrees   Bed type Semi-electric   Hoyer Lift Yes      05/28/20 1335   05/28/20 1331  For home use only DME lightweight manual wheelchair with seat cushion  Once       Comments: Patient suffers from left hip IM nailing which impairs their ability to perform daily activities like ambulating in the home.  A cane will not resolve  issue with performing activities of daily living. A wheelchair will allow patient to safely perform daily activities. Patient is not able to propel themselves in the home using a standard weight wheelchair due to generalized weakness Patient can self propel in the lightweight wheelchair. Length of need {6 months Accessories: elevating leg rests (ELRs), wheel locks, extensions and anti-tippers.   05/28/20 1335           Discharge Care Instructions  (From admission, onward)         Start     Ordered   05/28/20 0000  No dressing needed  05/28/20 1528          Contact information for follow-up providers    Erle Crocker, MD. Schedule an appointment as soon as possible for a visit in 2 weeks.   Specialty: Orthopedic Surgery Contact information: [place] [phone]            Contact information for after-discharge care    Destination    [facility] .   Service: Skilled Nursing Contact information: [address] [place] [phone]                 Allergies  Allergen Reactions  . Aspirin Itching  . Iodinated Diagnostic Agents Rash and Other (See Comments)    CT Contrast    Consultations:  Orthopedic surgery   Procedures/Studies: CT Head Wo Contrast  Result Date: 05/25/2020 CLINICAL DATA:  Fall EXAM: CT HEAD WITHOUT CONTRAST CT CERVICAL SPINE WITHOUT CONTRAST TECHNIQUE: Multidetector CT imaging of the head and cervical spine was performed following the standard protocol without intravenous contrast.  Multiplanar CT image reconstructions of the cervical spine were also generated. COMPARISON:  None. FINDINGS: CT HEAD FINDINGS Brain: Densely calcified extra-axial mass at the left convexity, likely meningioma. There is generalized atrophy without lobar predilection. There is hypoattenuation of the periventricular white matter, most commonly indicating chronic ischemic microangiopathy. Vascular: No abnormal hyperdensity of the major intracranial arteries or dural venous sinuses. No intracranial atherosclerosis. Skull: The visualized skull base, calvarium and extracranial soft tissues are normal. Sinuses/Orbits: No fluid levels or advanced mucosal thickening of the visualized paranasal sinuses. No mastoid or middle ear effusion. The orbits are normal. CT CERVICAL SPINE FINDINGS Alignment: No static subluxation. Facets are aligned. Occipital condyles are normally positioned. Skull base and vertebrae: No acute fracture. Soft tissues and spinal canal: No prevertebral fluid or swelling. No visible canal hematoma. Disc levels: Multilevel facet arthrosis. Degenerative disc disease is greatest at C3-4 and C6-7. No bony spinal canal stenosis. Upper chest: No pneumothorax, pulmonary nodule or pleural effusion. Other: Normal visualized paraspinal cervical soft tissues. IMPRESSION: 1. Chronic ischemic microangiopathy and generalized atrophy without acute intracranial abnormality. 2. Densely calcified left convexity meningioma without associated mass effect. 3. No acute fracture or static subluxation of the cervical spine. Electronically Signed   By: Ulyses Jarred M.D.   On: 05/25/2020 01:55   CT Cervical Spine Wo Contrast  Result Date: 05/25/2020 CLINICAL DATA:  Fall EXAM: CT HEAD WITHOUT CONTRAST CT CERVICAL SPINE WITHOUT CONTRAST TECHNIQUE: Multidetector CT imaging of the head and cervical spine was performed following the standard protocol without intravenous contrast. Multiplanar CT image reconstructions of the cervical  spine were also generated. COMPARISON:  None. FINDINGS: CT HEAD FINDINGS Brain: Densely calcified extra-axial mass at the left convexity, likely meningioma. There is generalized atrophy without lobar predilection. There is hypoattenuation of the periventricular white matter, most commonly indicating chronic ischemic microangiopathy. Vascular: No abnormal hyperdensity of the major intracranial arteries or dural venous sinuses. No intracranial atherosclerosis. Skull: The visualized skull base, calvarium and extracranial soft tissues are normal. Sinuses/Orbits: No fluid levels or advanced mucosal thickening of the visualized paranasal sinuses. No mastoid or middle ear effusion. The orbits are normal. CT CERVICAL SPINE FINDINGS Alignment: No static subluxation. Facets are aligned. Occipital condyles are normally positioned. Skull base and vertebrae: No acute fracture. Soft tissues and spinal canal: No prevertebral fluid or swelling. No visible canal hematoma. Disc levels: Multilevel facet arthrosis. Degenerative disc  disease is greatest at C3-4 and C6-7. No bony spinal canal stenosis. Upper chest: No pneumothorax, pulmonary nodule or pleural effusion. Other: Normal visualized paraspinal cervical soft tissues. IMPRESSION: 1. Chronic ischemic microangiopathy and generalized atrophy without acute intracranial abnormality. 2. Densely calcified left convexity meningioma without associated mass effect. 3. No acute fracture or static subluxation of the cervical spine. Electronically Signed   By: Ulyses Jarred M.D.   On: 05/25/2020 01:55   CT HIP RIGHT WO CONTRAST  Result Date: 05/29/2020 CLINICAL DATA:  Right hip pain after falling. Suspected hip fracture. Recent left hip fracture with ORIF. EXAM: CT OF THE RIGHT HIP WITHOUT CONTRAST TECHNIQUE: Multidetector CT imaging of the right hip was performed according to the standard protocol. Multiplanar CT image reconstructions were also generated. COMPARISON:  Radiographs same  date. Pelvic and left hip radiographs 05/25/2020. Pelvic CT 11/17/2014. FINDINGS: Bones/Joint/Cartilage No evidence of acute fracture or dislocation. The right femoral head remains located. The right pubic rami appear intact. There are mild right hip degenerative changes and no significant joint effusion. Ligaments Suboptimally assessed by CT. Muscles and Tendons Mild right hip muscular atrophy without focal hematoma. Mild tendinosis of the gluteus and hamstring tendons. Soft tissues No focal periarticular hematoma or other fluid collection. Mild iliofemoral atherosclerosis. The urinary bladder is noted to be at least moderately distended. IMPRESSION: 1. No evidence of acute right hip fracture or dislocation. The right pubic rami appear intact. 2. No periarticular hematoma. 3. Distended urinary bladder. Electronically Signed   By: Richardean Sale M.D.   On: 05/29/2020 16:53   DG Chest Portable 1 View  Result Date: 05/25/2020 CLINICAL DATA:  Preoperative radiograph, left hip pain after mechanical fall EXAM: PORTABLE CHEST 1 VIEW COMPARISON:  Radiograph 01/26/2017 FINDINGS: Extensive subpleural predominant reticular changes with diffuse bronchitic changes. No focal consolidative opacity or convincing features of edema. Cardiomegaly similar to priors with a calcified aorta. No acute osseous or soft tissue abnormality. Degenerative changes are present in the imaged spine and shoulders. Telemetry leads overlie the chest. IMPRESSION: 1. No acute cardiopulmonary abnormality. 2. Extensive subpleural predominant reticular changes could reflect underlying pulmonary fibrosis or more chronic interstitial changes. 3. Nodularity seen in the lungs on comparison CT is not as evident on comparison radiography. 4. Stable cardiomegaly. 5.  Aortic Atherosclerosis (ICD10-I70.0). Electronically Signed   By: Lovena Le M.D.   On: 05/25/2020 02:33   DG C-Arm 1-60 Min  Result Date: 05/25/2020 CLINICAL DATA:  Intramedullary nail.  EXAM: DG C-ARM 1-60 MIN; OPERATIVE LEFT HIP WITH PELVIS FLUOROSCOPY TIME:  Fluoroscopy Time:  1 minutes and 3 seconds. Radiation Exposure Index (if provided by the fluoroscopic device): 14.351 mGy. Number of Acquired Spot Images: 5 COMPARISON:  May 25, 2020. FINDINGS: Five C-arm fluoroscopic images were obtained intraoperatively and submitted for post operative interpretation. These images demonstrate placement of intramedullary nail and screw fixation of a inter trochanteric left femur fracture. Improved alignment with some persistent angulation at the fracture site. Please see the performing provider's procedural report for further detail. IMPRESSION: Intraoperative fluoroscopy, as detailed above. Electronically Signed   By: Margaretha Sheffield MD   On: 05/25/2020 16:31   DG HIP OPERATIVE UNILAT WITH PELVIS LEFT  Result Date: 05/25/2020 CLINICAL DATA:  Intramedullary nail. EXAM: DG C-ARM 1-60 MIN; OPERATIVE LEFT HIP WITH PELVIS FLUOROSCOPY TIME:  Fluoroscopy Time:  1 minutes and 3 seconds. Radiation Exposure Index (if provided by the fluoroscopic device): 14.351 mGy. Number of Acquired Spot Images: 5 COMPARISON:  May 25, 2020. FINDINGS:  Five C-arm fluoroscopic images were obtained intraoperatively and submitted for post operative interpretation. These images demonstrate placement of intramedullary nail and screw fixation of a inter trochanteric left femur fracture. Improved alignment with some persistent angulation at the fracture site. Please see the performing provider's procedural report for further detail. IMPRESSION: Intraoperative fluoroscopy, as detailed above. Electronically Signed   By: Margaretha Sheffield MD   On: 05/25/2020 16:31   DG Hip Unilat W or Wo Pelvis 2-3 Views Left  Result Date: 05/25/2020 CLINICAL DATA:  Left hip pain after fall EXAM: DG HIP (WITH OR WITHOUT PELVIS) 2-3V LEFT COMPARISON:  None. FINDINGS: Medially angulated reverse obliquity intertrochanteric fracture of the proximal left  femur. No dislocation at either hip. No other pelvic fracture. IMPRESSION: Medially angulated reverse obliquity intertrochanteric fracture of the proximal left femur. Electronically Signed   By: Ulyses Jarred M.D.   On: 05/25/2020 01:58   DG HIP UNILAT WITH PELVIS 2-3 VIEWS RIGHT  Result Date: 05/29/2020 CLINICAL DATA:  Right hip pain.  Recent left hip surgery. EXAM: DG HIP (WITH OR WITHOUT PELVIS) 2-3V RIGHT COMPARISON:  05/25/2020 FINDINGS: Interval fixation of left hip intertrochanteric fracture with hardware intact. Diffuse decreased bone mineralization. Mild degenerative change of the right hip and spine moderate degenerative change of the spine. No acute fracture or dislocation involving the right hip. On the cone-down right hip film, there is suggestion of a subtle lucency with cortical step-off over the right ischium as this may represent artifact versus subtle fracture. IMPRESSION: 1. Possible subtle fracture versus artifact over the right ischium. Consider noncontrast CT to exclude subtle fracture. 2. Left hip fracture post fixation with hardware intact. 3. Degenerative changes of the right hip and spine. Electronically Signed   By: Marin Olp M.D.   On: 05/29/2020 12:47   DG FEMUR MIN 2 VIEWS LEFT  Result Date: 05/25/2020 CLINICAL DATA:  Preop evaluation for upcoming fracture repair EXAM: LEFT FEMUR 2 VIEWS COMPARISON:  Film from earlier in the same day. FINDINGS: Comminuted intratrochanteric fracture is again identified in the proximal left femur. Impaction and angulation is noted at the fracture site. Degenerative changes of the knee joint are seen. No soft tissue abnormality is noted. IMPRESSION: Proximal comminuted intratrochanteric left femoral fracture. Diffuse degenerative changes of the knee joint. Electronically Signed   By: Inez Catalina M.D.   On: 05/25/2020 12:26     Subjective: No pain today. Ate breakfast. No questions/concerns. No issues overnight.  Discharge Exam: Vitals:    06/03/20 0629 06/03/20 0803  BP: (!) 144/43 110/60  Pulse: (!) 59 (!) 53  Resp: 19 14  Temp: 98.8 F (37.1 C) 97.9 F (36.6 C)  SpO2: 94% 100%   Vitals:   06/02/20 0649 06/02/20 1937 06/03/20 0629 06/03/20 0803  BP: (!) 145/61 (!) 121/53 (!) 144/43 110/60  Pulse: 62 70 (!) 59 (!) 53  Resp: 18 17 19 14   Temp:  97.6 F (36.4 C) 98.8 F (37.1 C) 97.9 F (36.6 C)  TempSrc:  Oral Oral Oral  SpO2: 98% 100% 94% 100%  Weight:      Height:        General: Pt is alert, awake, not in acute distress Cardiovascular: RRR, S1/S2 +, no rubs, no gallops Respiratory: CTA bilaterally, no wheezing, no rhonchi Abdominal: Soft, NT, ND, bowel sounds + Extremities: Left hip with dressing over incisions. Large area of ecchymosis which appears stable/improved    The results of significant diagnostics from this hospitalization (including imaging, microbiology, ancillary and  laboratory) are listed below for reference.     Microbiology: Recent Results (from the past 240 hour(s))  Resp Panel by RT-PCR (Flu A&B, Covid) Nasopharyngeal Swab     Status: None   Collection Time: 05/25/20 12:29 AM   Specimen: Nasopharyngeal Swab; Nasopharyngeal(NP) swabs in vial transport medium  Result Value Ref Range Status   SARS Coronavirus 2 by RT PCR NEGATIVE NEGATIVE Final    Comment: (NOTE) SARS-CoV-2 target nucleic acids are NOT DETECTED.  The SARS-CoV-2 RNA is generally detectable in upper respiratory specimens during the acute phase of infection. The lowest concentration of SARS-CoV-2 viral copies this assay can detect is 138 copies/mL. A negative result does not preclude SARS-Cov-2 infection and should not be used as the sole basis for treatment or other patient management decisions. A negative result may occur with  improper specimen collection/handling, submission of specimen other than nasopharyngeal swab, presence of viral mutation(s) within the areas targeted by this assay, and inadequate number  of viral copies(<138 copies/mL). A negative result must be combined with clinical observations, patient history, and epidemiological information. The expected result is Negative.  Fact Sheet for Patients:  EntrepreneurPulse.com.au  Fact Sheet for Healthcare Providers:  IncredibleEmployment.be  This test is no t yet approved or cleared by the [place] FDA and  has been authorized for detection and/or diagnosis of SARS-CoV-2 by FDA under an Emergency Use Authorization (EUA). This EUA will remain  in effect (meaning this test can be used) for the duration of the COVID-19 declaration under Section 564(b)(1) of the Act, 21 U.S.C.section 360bbb-3(b)(1), unless the authorization is terminated  or revoked sooner.       Influenza A by PCR NEGATIVE NEGATIVE Final   Influenza B by PCR NEGATIVE NEGATIVE Final    Comment: (NOTE) The Xpert Xpress SARS-CoV-2/FLU/RSV plus assay is intended as an aid in the diagnosis of influenza from Nasopharyngeal swab specimens and should not be used as a sole basis for treatment. Nasal washings and aspirates are unacceptable for Xpert Xpress SARS-CoV-2/FLU/RSV testing.  Fact Sheet for Patients: EntrepreneurPulse.com.au  Fact Sheet for Healthcare Providers: IncredibleEmployment.be  This test is not yet approved or cleared by the [place] FDA and has been authorized for detection and/or diagnosis of SARS-CoV-2 by FDA under an Emergency Use Authorization (EUA). This EUA will remain in effect (meaning this test can be used) for the duration of the COVID-19 declaration under Section 564(b)(1) of the Act, 21 U.S.C. section 360bbb-3(b)(1), unless the authorization is terminated or revoked.  Performed at [facility], [place] [address], [place], [place] [address]   Surgical pcr screen     Status: None   Collection Time: 05/25/20  9:09 AM   Specimen: Nasal Mucosa; Nasal Swab   Result Value Ref Range Status   MRSA, PCR NEGATIVE NEGATIVE Final   Staphylococcus aureus NEGATIVE NEGATIVE Final    Comment: (NOTE) The Xpert SA Assay (FDA approved for NASAL specimens in patients 65 years of age and older), is one component of a comprehensive surveillance program. It is not intended to diagnose infection nor to guide or monitor treatment. Performed at [facility], [place] [address], [place], [place] [address]   SARS CORONAVIRUS 2 (TAT 6-24 HRS) Nasopharyngeal Nasopharyngeal Swab     Status: None   Collection Time: 06/01/20 10:43 AM   Specimen: Nasopharyngeal Swab  Result Value Ref Range Status   SARS Coronavirus 2 NEGATIVE NEGATIVE Final    Comment: (NOTE) SARS-CoV-2 target nucleic acids are NOT DETECTED.  The SARS-CoV-2 RNA is generally detectable in upper and lower respiratory specimens during the acute phase of infection. Negative results do not preclude SARS-CoV-2 infection, do not rule out co-infections with other pathogens, and should not be used as the sole basis for treatment or other patient management decisions. Negative results must be combined with clinical observations, patient history, and epidemiological information. The expected result is Negative.  Fact Sheet for Patients: SugarRoll.be  Fact Sheet for Healthcare Providers: https://www.woods-mathews.com/  This test is not yet approved or cleared by the [place] FDA and  has been authorized for detection and/or diagnosis of SARS-CoV-2 by FDA under an Emergency Use Authorization (EUA). This EUA will remain  in effect (meaning this test can be used) for the duration of the COVID-19 declaration under Se ction 564(b)(1) of the Act, 21 U.S.C. section 360bbb-3(b)(1), unless the authorization is terminated or revoked sooner.  Performed at [facility], [place] [address], [place], [place] [address]      Labs: BNP (last 3 results) No  results for input(s): BNP in the last 8760 hours. Basic Metabolic Panel: Recent Labs  Lab 05/28/20 0127 05/30/20 0139 05/31/20 0155  NA 138 136 135  K 4.0 4.3 4.6  CL 104 102 103  CO2 31 27 25   GLUCOSE 117* 114* 119*  BUN 18 19 21   CREATININE 0.82 0.77 0.82  CALCIUM 8.0* 8.5* 8.4*   Liver Function Tests: Recent Labs  Lab 05/30/20 0139  AST 24  ALT 13  ALKPHOS 30*  BILITOT 1.0  PROT 4.9*  ALBUMIN 3.2*   No results for input(s): LIPASE, AMYLASE in the last 168 hours. No results for input(s): AMMONIA in the last 168 hours. CBC: Recent Labs  Lab 05/28/20 0127 05/29/20 0147 05/30/20 0139 05/31/20 0155 06/01/20 0121  WBC 3.5* 3.3* 3.4* 3.6* 4.1  NEUTROABS 2.1  --   --   --   --   HGB 8.7* 8.9* 8.9* 9.8* 10.4*  HCT 26.1* 26.6* 27.6* 30.3* 31.0*  MCV 93.2 94.0 96.5 97.1 95.1  PLT 85* 94* 111* 121* 125*   Cardiac Enzymes: No results for input(s): CKTOTAL, CKMB, CKMBINDEX, TROPONINI in the last 168 hours. BNP: Invalid input(s): POCBNP CBG: No results for input(s): GLUCAP in the last 168 hours. D-Dimer No results for input(s): DDIMER in the last 72 hours. Hgb A1c No results for input(s): HGBA1C in the last 72 hours. Lipid Profile No results for input(s): CHOL, HDL, LDLCALC, TRIG, CHOLHDL, LDLDIRECT in the last 72 hours. Thyroid function studies No results for input(s): TSH, T4TOTAL, T3FREE, THYROIDAB in the last 72 hours.  Invalid input(s): FREET3 Anemia work up No results for input(s): VITAMINB12, FOLATE, FERRITIN, TIBC, IRON, RETICCTPCT in the last 72 hours. Urinalysis    Component Value Date/Time   LABSPEC 1.015 10/09/2016 1300   PHURINE 6.0 10/09/2016 1300   GLUCOSEU Negative 10/09/2016 1300   HGBUR Negative 10/09/2016 1300   BILIRUBINUR Negative 10/09/2016 1300   KETONESUR Negative 10/09/2016 1300   PROTEINUR Negative 10/09/2016 1300   UROBILINOGEN 0.2 10/09/2016 1300   NITRITE Negative 10/09/2016 1300   LEUKOCYTESUR Negative 10/09/2016 1300    Sepsis Labs Invalid input(s): PROCALCITONIN,  WBC,  LACTICIDVEN Microbiology Recent Results (from the past 240 hour(s))  Resp Panel by RT-PCR (Flu A&B, Covid) Nasopharyngeal Swab     Status: None   Collection Time: 05/25/20 12:29 AM   Specimen: Nasopharyngeal Swab; Nasopharyngeal(NP) swabs in vial transport medium  Result Value Ref Range Status   SARS Coronavirus 2 by  RT PCR NEGATIVE NEGATIVE Final    Comment: (NOTE) SARS-CoV-2 target nucleic acids are NOT DETECTED.  The SARS-CoV-2 RNA is generally detectable in upper respiratory specimens during the acute phase of infection. The lowest concentration of SARS-CoV-2 viral copies this assay can detect is 138 copies/mL. A negative result does not preclude SARS-Cov-2 infection and should not be used as the sole basis for treatment or other patient management decisions. A negative result may occur with  improper specimen collection/handling, submission of specimen other than nasopharyngeal swab, presence of viral mutation(s) within the areas targeted by this assay, and inadequate number of viral copies(<138 copies/mL). A negative result must be combined with clinical observations, patient history, and epidemiological information. The expected result is Negative.  Fact Sheet for Patients:  EntrepreneurPulse.com.au  Fact Sheet for Healthcare Providers:  IncredibleEmployment.be  This test is no t yet approved or cleared by the [place] FDA and  has been authorized for detection and/or diagnosis of SARS-CoV-2 by FDA under an Emergency Use Authorization (EUA). This EUA will remain  in effect (meaning this test can be used) for the duration of the COVID-19 declaration under Section 564(b)(1) of the Act, 21 U.S.C.section 360bbb-3(b)(1), unless the authorization is terminated  or revoked sooner.       Influenza A by PCR NEGATIVE NEGATIVE Final   Influenza B by PCR NEGATIVE NEGATIVE Final     Comment: (NOTE) The Xpert Xpress SARS-CoV-2/FLU/RSV plus assay is intended as an aid in the diagnosis of influenza from Nasopharyngeal swab specimens and should not be used as a sole basis for treatment. Nasal washings and aspirates are unacceptable for Xpert Xpress SARS-CoV-2/FLU/RSV testing.  Fact Sheet for Patients: EntrepreneurPulse.com.au  Fact Sheet for Healthcare Providers: IncredibleEmployment.be  This test is not yet approved or cleared by the [place] FDA and has been authorized for detection and/or diagnosis of SARS-CoV-2 by FDA under an Emergency Use Authorization (EUA). This EUA will remain in effect (meaning this test can be used) for the duration of the COVID-19 declaration under Section 564(b)(1) of the Act, 21 U.S.C. section 360bbb-3(b)(1), unless the authorization is terminated or revoked.  Performed at [facility], [place] [address] [place], [place] [address]   Surgical pcr screen     Status: None   Collection Time: 05/25/20  9:09 AM   Specimen: Nasal Mucosa; Nasal Swab  Result Value Ref Range Status   MRSA, PCR NEGATIVE NEGATIVE Final   Staphylococcus aureus NEGATIVE NEGATIVE Final    Comment: (NOTE) The Xpert SA Assay (FDA approved for NASAL specimens in patients 69 years of age and older), is one component of a comprehensive surveillance program. It is not intended to diagnose infection nor to guide or monitor treatment. Performed at [facility], [place] [address] [place], [place] [address]   SARS CORONAVIRUS 2 (TAT 6-24 HRS) Nasopharyngeal Nasopharyngeal Swab     Status: None   Collection Time: 06/01/20 10:43 AM   Specimen: Nasopharyngeal Swab  Result Value Ref Range Status   SARS Coronavirus 2 NEGATIVE NEGATIVE Final    Comment: (NOTE) SARS-CoV-2 target nucleic acids are NOT DETECTED.  The SARS-CoV-2 RNA is generally detectable in upper and lower respiratory specimens during the acute phase of  infection. Negative results do not preclude SARS-CoV-2 infection, do not rule out co-infections with other pathogens, and should not be used as the sole basis for treatment or other patient management decisions. Negative results must be combined with clinical observations, patient history, and epidemiological information. The  expected result is Negative.  Fact Sheet for Patients: SugarRoll.be  Fact Sheet for Healthcare Providers: https://www.woods-mathews.com/  This test is not yet approved or cleared by the [place] FDA and  has been authorized for detection and/or diagnosis of SARS-CoV-2 by FDA under an Emergency Use Authorization (EUA). This EUA will remain  in effect (meaning this test can be used) for the duration of the COVID-19 declaration under Se ction 564(b)(1) of the Act, 21 U.S.C. section 360bbb-3(b)(1), unless the authorization is terminated or revoked sooner.  Performed at [facility], [place] [address] [place], [place] [address]      Time coordinating discharge: 35 minutes  SIGNED:   Cordelia Poche, MD [facility] 06/03/2020, 11:59 AM

## 2020-06-02 NOTE — Care Management Important Message (Signed)
Important Message  Patient Details  Name: Amanda Sellers MRN: [id number] Date of Birth: 08-17-1932   Medicare Important Message Given:  Yes     Brianne Maina P Anaisha Mago 06/02/2020, 11:55 AM

## 2020-06-02 NOTE — Progress Notes (Signed)
PROGRESS NOTE    Amanda Sellers  AJO:[id number] DOB: Sep 04, 1932 DOA: 05/25/2020 PCP: Tammi Sou, MD   Brief Narrative: Amanda Sellers is a 87 y.o. female with a history of atrial fibrillation on Eliquis, diastolic heart failure, hypertension, CKD stage IIIa, hyperlipidemia.  Patient presented secondary to a fall and suffering a left femur fracture.  Orthopedic surgery was consulted and patient underwent intramedullary nail placement on 5/3.   Assessment & Plan:   Principal Problem:   Fracture, proximal femur, left, closed, initial encounter ([place]) Active Problems:   Chronic diastolic CHF (congestive heart failure) (HCC)   Hypertension   Paroxysmal atrial fibrillation ([place]); CHA2DS2Vasc 6 - Eliquis; Amio 100 mg daily   Anticoagulation adequate, Eliquis   LBBB (left bundle branch block)   Intertrochanteric left femur fracture Secondary to fall from losing balance.  Orthopedic surgery was consulted and patient underwent cephalomedullary nail placement. -Orthopedic surgery recommendations: Outpatient follow-up in 2 weeks, dressing changes as needed for saturation -PT OT recommendations: SNF discharge  Acute blood loss anemia Iron deficiency anemia In setting of surgery as mentioned above.  Patient received 1 unit PRBC for hemoglobin of 7.4.  Posttransfusion hemoglobin of 8.7 which has remained stable.  Thrombocytopenia Patient with chronic thrombocytopenia with transient acute drop. Now stabilized.  Right hip pain X-ray with possible subtle fracture of the right ischium which was confirmed negative on CT scan.  CKD stage IIIa Baseline creatinine of 0.9. Peak creatinine of 1.17. Does not meet criteria for AKI.  Primary hypertension -Continue Entresto  Chronic systolic heart failure -Continue Entresto  Hypothyroidism -Continue Synthroid  Paroxysmal atrial fibrillation -Continue Eliquis and amiodarone  Hyperlipidemia -Continue Crestor  Anxiety -Continue  Klonopin prn  Obesity Body mass index is 33.23 kg/m.   DVT prophylaxis: Eliquis Code Status:   Code Status: Full Code Family Communication: Daughter at bedside Disposition Plan: Discharge to SNF pending insurance authorization. Medically stable for discharge.   Consultants:   Orthopedic surgery  Procedures:   INTRAMEDULLARY NAIL (05/25/2020)  Antimicrobials:  None    Subjective: No concerns this morning.   Objective: Vitals:   06/01/20 0630 06/01/20 1533 06/01/20 2200 06/02/20 0649  BP: 115/65 (!) 147/63 (!) 121/49 (!) 145/61  Pulse: (!) 58 63 62 62  Resp: 16 17 16 18   Temp: 98 F (36.7 C) 98.1 F (36.7 C) 97.6 F (36.4 C)   TempSrc: Oral Oral Oral   SpO2: 100% 98% 96% 98%  Weight:      Height:       No intake or output data in the 24 hours ending 06/02/20 1803 Filed Weights   05/25/20 0030  Weight: 93.4 kg    Examination:  General exam: Appears calm and comfortable Respiratory system: Clear to auscultation. Respiratory effort normal. Cardiovascular system: S1 & S2 heard, RRR Gastrointestinal system: Abdomen is nondistended, soft and nontender. No organomegaly or masses felt. Normal bowel sounds heard. Central nervous system: Alert and oriented. No focal neurological deficits. Musculoskeletal: No edema. No calf tenderness. Left thigh with intact/clean dressings Skin: No cyanosis. No rashes Psychiatry: Judgement and insight appear normal. Mood & affect appropriate.     Data Reviewed: I have personally reviewed following labs and imaging studies  CBC Lab Results  Component Value Date   WBC 4.1 06/01/2020   RBC 3.26 (L) 06/01/2020   HGB 10.4 (L) 06/01/2020   HCT 31.0 (L) 06/01/2020   MCV 95.1 06/01/2020   MCH 31.9 06/01/2020   PLT 125 (L) 06/01/2020   MCHC  33.5 06/01/2020   RDW 15.6 (H) 06/01/2020   LYMPHSABS 0.9 05/28/2020   MONOABS 0.4 05/28/2020   EOSABS 0.1 05/28/2020   BASOSABS 0.0 34/74/2595     Last metabolic panel Lab Results   Component Value Date   NA 135 05/31/2020   K 4.6 05/31/2020   CL 103 05/31/2020   CO2 25 05/31/2020   BUN 21 05/31/2020   CREATININE 0.82 05/31/2020   GLUCOSE 119 (H) 05/31/2020   GFRNONAA >60 05/31/2020   GFRAA 50 (L) 05/29/2019   CALCIUM 8.4 (L) 05/31/2020   PHOS 4.7 (H) 05/21/2019   PROT 4.9 (L) 05/30/2020   ALBUMIN 3.2 (L) 05/30/2020   LABGLOB 3.2 07/23/2014   AGRATIO 0.8 07/23/2014   BILITOT 1.0 05/30/2020   ALKPHOS 30 (L) 05/30/2020   AST 24 05/30/2020   ALT 13 05/30/2020   ANIONGAP 7 05/31/2020    CBG (last 3)  No results for input(s): GLUCAP in the last 72 hours.   GFR: Estimated Creatinine Clearance: 57.7 mL/min (by C-G formula based on SCr of 0.82 mg/dL).  Coagulation Profile: No results for input(s): INR, PROTIME in the last 168 hours.  Recent Results (from the past 240 hour(s))  Resp Panel by RT-PCR (Flu A&B, Covid) Nasopharyngeal Swab     Status: None   Collection Time: 05/25/20 12:29 AM   Specimen: Nasopharyngeal Swab; Nasopharyngeal(NP) swabs in vial transport medium  Result Value Ref Range Status   SARS Coronavirus 2 by RT PCR NEGATIVE NEGATIVE Final    Comment: (NOTE) SARS-CoV-2 target nucleic acids are NOT DETECTED.  The SARS-CoV-2 RNA is generally detectable in upper respiratory specimens during the acute phase of infection. The lowest concentration of SARS-CoV-2 viral copies this assay can detect is 138 copies/mL. A negative result does not preclude SARS-Cov-2 infection and should not be used as the sole basis for treatment or other patient management decisions. A negative result may occur with  improper specimen collection/handling, submission of specimen other than nasopharyngeal swab, presence of viral mutation(s) within the areas targeted by this assay, and inadequate number of viral copies(<138 copies/mL). A negative result must be combined with clinical observations, patient history, and epidemiological information. The expected result  is Negative.  Fact Sheet for Patients:  EntrepreneurPulse.com.au  Fact Sheet for Healthcare Providers:  IncredibleEmployment.be  This test is no t yet approved or cleared by the [place] FDA and  has been authorized for detection and/or diagnosis of SARS-CoV-2 by FDA under an Emergency Use Authorization (EUA). This EUA will remain  in effect (meaning this test can be used) for the duration of the COVID-19 declaration under Section 564(b)(1) of the Act, 21 U.S.C.section 360bbb-3(b)(1), unless the authorization is terminated  or revoked sooner.       Influenza A by PCR NEGATIVE NEGATIVE Final   Influenza B by PCR NEGATIVE NEGATIVE Final    Comment: (NOTE) The Xpert Xpress SARS-CoV-2/FLU/RSV plus assay is intended as an aid in the diagnosis of influenza from Nasopharyngeal swab specimens and should not be used as a sole basis for treatment. Nasal washings and aspirates are unacceptable for Xpert Xpress SARS-CoV-2/FLU/RSV testing.  Fact Sheet for Patients: EntrepreneurPulse.com.au  Fact Sheet for Healthcare Providers: IncredibleEmployment.be  This test is not yet approved or cleared by the [place] FDA and has been authorized for detection and/or diagnosis of SARS-CoV-2 by FDA under an Emergency Use Authorization (EUA). This EUA will remain in effect (meaning this test can be used) for the duration of the COVID-19 declaration under Section  564(b)(1) of the Act, 21 U.S.C. section 360bbb-3(b)(1), unless the authorization is terminated or revoked.  Performed at [facility], [place] [address] [place], [place] [address]   Surgical pcr screen     Status: None   Collection Time: 05/25/20  9:09 AM   Specimen: Nasal Mucosa; Nasal Swab  Result Value Ref Range Status   MRSA, PCR NEGATIVE NEGATIVE Final   Staphylococcus aureus NEGATIVE NEGATIVE Final    Comment: (NOTE) The Xpert SA Assay (FDA  approved for NASAL specimens in patients 65 years of age and older), is one component of a comprehensive surveillance program. It is not intended to diagnose infection nor to guide or monitor treatment. Performed at [facility], [place] [address], [place], [place] [address]   SARS CORONAVIRUS 2 (TAT 6-24 HRS) Nasopharyngeal Nasopharyngeal Swab     Status: None   Collection Time: 06/01/20 10:43 AM   Specimen: Nasopharyngeal Swab  Result Value Ref Range Status   SARS Coronavirus 2 NEGATIVE NEGATIVE Final    Comment: (NOTE) SARS-CoV-2 target nucleic acids are NOT DETECTED.  The SARS-CoV-2 RNA is generally detectable in upper and lower respiratory specimens during the acute phase of infection. Negative results do not preclude SARS-CoV-2 infection, do not rule out co-infections with other pathogens, and should not be used as the sole basis for treatment or other patient management decisions. Negative results must be combined with clinical observations, patient history, and epidemiological information. The expected result is Negative.  Fact Sheet for Patients: SugarRoll.be  Fact Sheet for Healthcare Providers: https://www.woods-mathews.com/  This test is not yet approved or cleared by the [place] FDA and  has been authorized for detection and/or diagnosis of SARS-CoV-2 by FDA under an Emergency Use Authorization (EUA). This EUA will remain  in effect (meaning this test can be used) for the duration of the COVID-19 declaration under Se ction 564(b)(1) of the Act, 21 U.S.C. section 360bbb-3(b)(1), unless the authorization is terminated or revoked sooner.  Performed at [facility], [place] [address], [place], [place] [address]         Radiology Studies: No results found.      Scheduled Meds: . sodium chloride   Intravenous Once  . amiodarone  200 mg Oral Daily  . apixaban  5 mg Oral BID  . docusate sodium  100 mg  Oral BID  . feeding supplement  237 mL Oral BID BM  . ferrous sulfate  325 mg Oral BID WC  . levothyroxine  100 mcg Oral Q0600  . multivitamin with minerals  1 tablet Oral Daily  . potassium chloride SA  20 mEq Oral Daily  . rosuvastatin  20 mg Oral Daily  . sacubitril-valsartan  1 tablet Oral BID   Continuous Infusions:   LOS: 8 days     Cordelia Poche, MD [facility] 06/02/2020, 6:03 PM  If 7PM-7AM, please contact night-coverage [email or web address]

## 2020-06-02 NOTE — Progress Notes (Signed)
Physical Therapy Treatment Patient Details Name: Amanda Sellers MRN: [id number] DOB: 07-11-1932 Today's Date: 06/02/2020    History of Present Illness 87 yo female with onset of fall at home, was brought to hosp with proximal femoral intertrochanteric fracture.  After ORIF was WBAT.  PMHx:  a-fib, Eliquis, CHF, HTN, fall    PT Comments    Pt was seen for mobility on RW with attempts to stand unsuccessfully, but was able to lift her hips from the chair with UE's and using legs more effectively.  Pt is also able to clear hips from chair to scoot back on the chair, as well as to adjust posture to midline alignment.  Pt did strengthening ex's to BLE's and now her L hip is having soreness from mobility.  Encouraged pt to have nursing refill her ice packs that PT filled this afternoon, when ice melts later and pain management is not as effective.  Pt agreed to monitor this.  Contact from SW to PT to ask for a call to the ins company for validation of SNF care.  PT called the physician with ins company and did not receive a call back from the message left by PT.  Follow up with pt for goals of acute PT.  Follow Up Recommendations  SNF;Supervision/Assistance - 24 hour;Other (comment)     Equipment Recommendations  Wheelchair cushion (measurements PT);Wheelchair (measurements PT);Hospital bed;Other (comment)    Recommendations for Other Services       Precautions / Restrictions Precautions Precautions: Fall Precaution Comments: has a great deal of edema on LE's Restrictions Weight Bearing Restrictions: Yes LLE Weight Bearing: Weight bearing as tolerated    Mobility  Bed Mobility Overal bed mobility: Needs Assistance             General bed mobility comments: up in chair when PT arrives    Transfers Overall transfer level: Needs assistance Equipment used: Rolling walker (2 wheeled);1 person hand held assist Transfers: Sit to/from Stand Sit to Stand: Max assist          General transfer comment: max to partially stand so worked on chair stands, pt able to lift hips in that situation  Ambulation/Gait             General Gait Details: unable   Stairs             Wheelchair Mobility    Modified Rankin (Stroke Patients Only)       Balance Overall balance assessment: History of Falls;Needs assistance Sitting-balance support: Feet supported;Bilateral upper extremity supported Sitting balance-Leahy Scale: Fair     Standing balance support: Bilateral upper extremity supported Standing balance-Leahy Scale: Zero                              Cognition Arousal/Alertness: Awake/alert Behavior During Therapy: WFL for tasks assessed/performed;Anxious Overall Cognitive Status: Within Functional Limits for tasks assessed                                 General Comments: pt has a daughter who is talking over her      Exercises General Exercises - Lower Extremity Long Arc Quad: Strengthening;10 reps Heel Slides: Strengthening;10 reps Hip ABduction/ADduction: Strengthening;10 reps    General Comments General comments (skin integrity, edema, etc.): pt is assisted to attempt standing but is putting in a low effort with arms and legs, but  more controlled and effective to do chair stands wtih LLE support by PT      Pertinent Vitals/Pain Pain Assessment: Faces Faces Pain Scale: Hurts even more Pain Location: L hip Pain Descriptors / Indicators: Grimacing Pain Intervention(s): Monitored during session;Repositioned;Premedicated before session;Ice applied    Home Living                      Prior Function            PT Goals (current goals can now be found in the care plan section) Acute Rehab PT Goals Patient Stated Goal: to go home Progress towards PT goals: Progressing toward goals    Frequency    Min 3X/week      PT Plan Current plan remains appropriate    Co-evaluation               AM-PAC PT "6 Clicks" Mobility   Outcome Measure  Help needed turning from your back to your side while in a flat bed without using bedrails?: A Lot Help needed moving from lying on your back to sitting on the side of a flat bed without using bedrails?: A Lot Help needed moving to and from a bed to a chair (including a wheelchair)?: Total Help needed standing up from a chair using your arms (e.g., wheelchair or bedside chair)?: Total Help needed to walk in hospital room?: Total Help needed climbing 3-5 steps with a railing? : Total 6 Click Score: 8    End of Session Equipment Utilized During Treatment: Gait belt Activity Tolerance: Patient limited by pain Patient left: in chair;with call bell/phone within reach;with chair alarm set;with family/visitor present Nurse Communication: Mobility status;Patient requests pain meds PT Visit Diagnosis: Unsteadiness on feet (R26.81);Muscle weakness (generalized) (M62.81);Difficulty in walking, not elsewhere classified (R26.2)     Time: 5681-2751 PT Time Calculation (min) (ACUTE ONLY): 23 min  Charges:  $Therapeutic Exercise: 8-22 mins $Therapeutic Activity: 8-22 mins         Ramond Dial 06/02/2020, 8:06 PM  Mee Hives, PT MS Acute Rehab Dept. Number: [place] [facility] [place]

## 2020-06-03 ENCOUNTER — Encounter: Payer: Self-pay | Admitting: Family Medicine

## 2020-06-03 DIAGNOSIS — R2689 Other abnormalities of gait and mobility: Secondary | ICD-10-CM | POA: Diagnosis not present

## 2020-06-03 DIAGNOSIS — L89629 Pressure ulcer of left heel, unspecified stage: Secondary | ICD-10-CM | POA: Diagnosis not present

## 2020-06-03 DIAGNOSIS — R410 Disorientation, unspecified: Secondary | ICD-10-CM | POA: Diagnosis not present

## 2020-06-03 DIAGNOSIS — Z20822 Contact with and (suspected) exposure to covid-19: Secondary | ICD-10-CM | POA: Diagnosis not present

## 2020-06-03 DIAGNOSIS — I10 Essential (primary) hypertension: Secondary | ICD-10-CM | POA: Diagnosis not present

## 2020-06-03 DIAGNOSIS — I480 Paroxysmal atrial fibrillation: Secondary | ICD-10-CM | POA: Diagnosis not present

## 2020-06-03 DIAGNOSIS — K8020 Calculus of gallbladder without cholecystitis without obstruction: Secondary | ICD-10-CM | POA: Diagnosis not present

## 2020-06-03 DIAGNOSIS — Z91041 Radiographic dye allergy status: Secondary | ICD-10-CM | POA: Diagnosis not present

## 2020-06-03 DIAGNOSIS — S79929A Unspecified injury of unspecified thigh, initial encounter: Secondary | ICD-10-CM | POA: Diagnosis not present

## 2020-06-03 DIAGNOSIS — Z7401 Bed confinement status: Secondary | ICD-10-CM | POA: Diagnosis not present

## 2020-06-03 DIAGNOSIS — M6281 Muscle weakness (generalized): Secondary | ICD-10-CM | POA: Diagnosis not present

## 2020-06-03 DIAGNOSIS — N183 Chronic kidney disease, stage 3 unspecified: Secondary | ICD-10-CM | POA: Diagnosis not present

## 2020-06-03 DIAGNOSIS — K8010 Calculus of gallbladder with chronic cholecystitis without obstruction: Secondary | ICD-10-CM | POA: Diagnosis not present

## 2020-06-03 DIAGNOSIS — R531 Weakness: Secondary | ICD-10-CM | POA: Diagnosis not present

## 2020-06-03 DIAGNOSIS — S72002D Fracture of unspecified part of neck of left femur, subsequent encounter for closed fracture with routine healing: Secondary | ICD-10-CM | POA: Diagnosis not present

## 2020-06-03 DIAGNOSIS — T8189XA Other complications of procedures, not elsewhere classified, initial encounter: Secondary | ICD-10-CM | POA: Diagnosis not present

## 2020-06-03 DIAGNOSIS — Z66 Do not resuscitate: Secondary | ICD-10-CM | POA: Diagnosis not present

## 2020-06-03 DIAGNOSIS — R609 Edema, unspecified: Secondary | ICD-10-CM | POA: Diagnosis not present

## 2020-06-03 DIAGNOSIS — R41841 Cognitive communication deficit: Secondary | ICD-10-CM | POA: Diagnosis not present

## 2020-06-03 DIAGNOSIS — E785 Hyperlipidemia, unspecified: Secondary | ICD-10-CM | POA: Diagnosis not present

## 2020-06-03 DIAGNOSIS — Z8572 Personal history of non-Hodgkin lymphomas: Secondary | ICD-10-CM | POA: Diagnosis not present

## 2020-06-03 DIAGNOSIS — J984 Other disorders of lung: Secondary | ICD-10-CM | POA: Diagnosis not present

## 2020-06-03 DIAGNOSIS — J449 Chronic obstructive pulmonary disease, unspecified: Secondary | ICD-10-CM | POA: Diagnosis not present

## 2020-06-03 DIAGNOSIS — K5730 Diverticulosis of large intestine without perforation or abscess without bleeding: Secondary | ICD-10-CM | POA: Diagnosis not present

## 2020-06-03 DIAGNOSIS — S72002A Fracture of unspecified part of neck of left femur, initial encounter for closed fracture: Secondary | ICD-10-CM | POA: Diagnosis not present

## 2020-06-03 DIAGNOSIS — E538 Deficiency of other specified B group vitamins: Secondary | ICD-10-CM | POA: Diagnosis not present

## 2020-06-03 DIAGNOSIS — K449 Diaphragmatic hernia without obstruction or gangrene: Secondary | ICD-10-CM | POA: Diagnosis not present

## 2020-06-03 DIAGNOSIS — R059 Cough, unspecified: Secondary | ICD-10-CM | POA: Diagnosis not present

## 2020-06-03 DIAGNOSIS — N200 Calculus of kidney: Secondary | ICD-10-CM | POA: Diagnosis not present

## 2020-06-03 DIAGNOSIS — Z9889 Other specified postprocedural states: Secondary | ICD-10-CM | POA: Diagnosis not present

## 2020-06-03 DIAGNOSIS — C8200 Follicular lymphoma grade I, unspecified site: Secondary | ICD-10-CM | POA: Diagnosis not present

## 2020-06-03 DIAGNOSIS — I130 Hypertensive heart and chronic kidney disease with heart failure and stage 1 through stage 4 chronic kidney disease, or unspecified chronic kidney disease: Secondary | ICD-10-CM | POA: Diagnosis not present

## 2020-06-03 DIAGNOSIS — E039 Hypothyroidism, unspecified: Secondary | ICD-10-CM | POA: Diagnosis not present

## 2020-06-03 DIAGNOSIS — Z9221 Personal history of antineoplastic chemotherapy: Secondary | ICD-10-CM | POA: Diagnosis not present

## 2020-06-03 DIAGNOSIS — I5032 Chronic diastolic (congestive) heart failure: Secondary | ICD-10-CM | POA: Diagnosis not present

## 2020-06-03 DIAGNOSIS — Z9181 History of falling: Secondary | ICD-10-CM | POA: Diagnosis not present

## 2020-06-03 DIAGNOSIS — I4891 Unspecified atrial fibrillation: Secondary | ICD-10-CM | POA: Diagnosis not present

## 2020-06-03 DIAGNOSIS — Z7901 Long term (current) use of anticoagulants: Secondary | ICD-10-CM | POA: Diagnosis not present

## 2020-06-03 DIAGNOSIS — R4182 Altered mental status, unspecified: Secondary | ICD-10-CM | POA: Diagnosis not present

## 2020-06-03 DIAGNOSIS — N1831 Chronic kidney disease, stage 3a: Secondary | ICD-10-CM | POA: Diagnosis not present

## 2020-06-03 DIAGNOSIS — I447 Left bundle-branch block, unspecified: Secondary | ICD-10-CM | POA: Diagnosis not present

## 2020-06-03 DIAGNOSIS — R001 Bradycardia, unspecified: Secondary | ICD-10-CM | POA: Diagnosis not present

## 2020-06-03 DIAGNOSIS — Z79899 Other long term (current) drug therapy: Secondary | ICD-10-CM | POA: Diagnosis not present

## 2020-06-03 DIAGNOSIS — L89619 Pressure ulcer of right heel, unspecified stage: Secondary | ICD-10-CM | POA: Diagnosis not present

## 2020-06-03 DIAGNOSIS — R0689 Other abnormalities of breathing: Secondary | ICD-10-CM | POA: Diagnosis not present

## 2020-06-03 DIAGNOSIS — M2550 Pain in unspecified joint: Secondary | ICD-10-CM | POA: Diagnosis not present

## 2020-06-03 DIAGNOSIS — D509 Iron deficiency anemia, unspecified: Secondary | ICD-10-CM | POA: Diagnosis not present

## 2020-06-03 DIAGNOSIS — I959 Hypotension, unspecified: Secondary | ICD-10-CM | POA: Diagnosis not present

## 2020-06-03 DIAGNOSIS — R441 Visual hallucinations: Secondary | ICD-10-CM | POA: Diagnosis not present

## 2020-06-03 DIAGNOSIS — D62 Acute posthemorrhagic anemia: Secondary | ICD-10-CM | POA: Diagnosis not present

## 2020-06-03 DIAGNOSIS — I5042 Chronic combined systolic (congestive) and diastolic (congestive) heart failure: Secondary | ICD-10-CM | POA: Diagnosis not present

## 2020-06-03 DIAGNOSIS — N179 Acute kidney failure, unspecified: Secondary | ICD-10-CM | POA: Diagnosis not present

## 2020-06-03 DIAGNOSIS — R442 Other hallucinations: Secondary | ICD-10-CM | POA: Diagnosis not present

## 2020-06-03 DIAGNOSIS — G9341 Metabolic encephalopathy: Secondary | ICD-10-CM | POA: Diagnosis not present

## 2020-06-03 DIAGNOSIS — I5022 Chronic systolic (congestive) heart failure: Secondary | ICD-10-CM | POA: Diagnosis not present

## 2020-06-03 DIAGNOSIS — D510 Vitamin B12 deficiency anemia due to intrinsic factor deficiency: Secondary | ICD-10-CM | POA: Diagnosis not present

## 2020-06-03 DIAGNOSIS — Z886 Allergy status to analgesic agent status: Secondary | ICD-10-CM | POA: Diagnosis not present

## 2020-06-03 DIAGNOSIS — L97221 Non-pressure chronic ulcer of left calf limited to breakdown of skin: Secondary | ICD-10-CM | POA: Diagnosis not present

## 2020-06-03 MED ORDER — DICLOFENAC SODIUM 1 % EX GEL: 2.0000 g | Freq: Four times a day (QID) | CUTANEOUS | Status: DC

## 2020-06-03 MED ORDER — CLONAZEPAM 0.5 MG PO TABS: 0.2500 mg | ORAL_TABLET | Freq: Two times a day (BID) | ORAL | 0 refills | Status: DC | PRN

## 2020-06-03 MED ORDER — DICLOFENAC SODIUM 1 % EX GEL
2.0000 g | Freq: Four times a day (QID) | CUTANEOUS | Status: DC
  Administered 2020-06-03 (×2): 2 g via TOPICAL
  Filled 2020-06-03: qty 100

## 2020-06-03 MED ORDER — OXYCODONE-ACETAMINOPHEN 5-325 MG PO TABS: 1.0000 | ORAL_TABLET | ORAL | 0 refills | Status: DC | PRN

## 2020-06-03 MED ORDER — POLYETHYLENE GLYCOL 3350 17 G PO PACK: 17.0000 g | PACK | Freq: Every day | ORAL | Status: DC

## 2020-06-03 NOTE — TOC Transition Note (Addendum)
Transition of Care [facility]) - CM/SW Discharge Note   Patient Details  Name: Amanda Sellers MRN: [id number] Date of Birth: June 27, 1932  Transition of Care [facility]) CM/SW Contact:  Milinda Antis, [place] Phone Number: 06/03/2020, 12:18 PM   Clinical Narrative:    Patient will DC to: SNF [facility]) Anticipated DC date: 06/03/2020 Family notified: YES Transport by: Corey Harold   Per MD patient ready for DC to SNF . RN to call report prior to discharge [phone]. The patient will be in room 131 on the [place] hall.  RN, patient, patient's family, and facility notified of DC. Discharge Summary and FL2 sent to facility. DC packet on chart. Ambulance transport requested for patient.   PTAR authorization number= [id number] SNF authorization number= [id number]  CSW will sign off for now as social work intervention is no longer needed. Please consult [place] again if new needs arise.     Final next level of care: Skilled Nursing Facility Barriers to Discharge: Barriers Resolved   Patient Goals and CMS Choice Patient states their goals for this hospitalization and ongoing recovery are:: Return home to be cared for by adult children CMS Medicare.gov Compare Post Acute Care list provided to:: Patient Choice offered to / list presented to : Adult Children,Patient  Discharge Placement PASRR number recieved: 06/01/20            Patient chooses bed at:  [facility]) Patient to be transferred to facility by: [place] Name of family member notified: Kenney Houseman daughter Patient and family notified of of transfer: 06/03/20  Discharge Plan and Services   Discharge Planning Services: CM Consult Post Acute Care Choice: [place] Equipment          DME Arranged: Hospital bed,Lightweight manual wheelchair with seat cushion,Other see comment (hoyer lift) DME Agency: [employer] Date DME Agency Contacted: 05/28/20 Time DME Agency Contacted: [phone] Representative spoke with at DME Agency:  [place]: Nurse's Aide,PT,OT [place] Agency: [place] Date [place]: 05/28/20 Time [place]: [place] spoke with at [place]: [place] ([place]) Interventions     Readmission Risk Interventions No flowsheet data found.

## 2020-06-03 NOTE — Plan of Care (Signed)
  Problem: Education: Goal: Knowledge of General Education information will improve Description: Including pain rating scale, medication(s)/side effects and non-pharmacologic comfort measures Outcome: Progressing   Problem: Health Behavior/Discharge Planning: Goal: Ability to manage health-related needs will improve Outcome: Progressing   Problem: Clinical Measurements: Goal: Ability to maintain clinical measurements within normal limits will improve Outcome: Progressing Goal: Will remain free from infection Outcome: Progressing Goal: Diagnostic test results will improve Outcome: Progressing Goal: Respiratory complications will improve Outcome: Progressing Goal: Cardiovascular complication will be avoided Outcome: Progressing   Problem: Elimination: Goal: Will not experience complications related to bowel motility Outcome: Progressing Goal: Will not experience complications related to urinary retention Outcome: Progressing   Problem: Nutrition: Goal: Adequate nutrition will be maintained Outcome: Progressing   Problem: Pain Managment: Goal: General experience of comfort will improve Outcome: Progressing   Problem: Safety: Goal: Ability to remain free from injury will improve Outcome: Progressing   Problem: Skin Integrity: Goal: Risk for impaired skin integrity will decrease Outcome: Progressing

## 2020-06-03 NOTE — Progress Notes (Signed)
Nutrition Follow-up  DOCUMENTATION CODES:   Not applicable  INTERVENTION:   -Continue with regular diet -D/c Ensure Enlive po BID, each supplement provides 350 kcal and 20 grams of protein -MVI with minerals daily  NUTRITION DIAGNOSIS:   Increased nutrient needs related to hip fracture as evidenced by estimated needs.  Ongoing  GOAL:   Patient will meet greater than or equal to 90% of their needs  Progressing   MONITOR:   PO intake,Supplement acceptance,Weight trends,Labs,I & O's,Skin  REASON FOR ASSESSMENT:   Consult Hip fracture protocol  ASSESSMENT:   Patient with PMH significant for CHF, CKD III, follicular lymphoma s/p chemo in 2016, HLD, HTN, and pulmonary HTN. Presents this admission after mechanical fall resulting in L hip fracture.  5/3- s/p PROCEDURE: Cephalomedullary nail of left intertrochanteric hip fracture   Reviewed I/O's: +60 ml x 24 hours and +530 ml x 24 hours  UOP: 300 ml x 24 hours  Pt unavailable at time of visit. Attempted to speak with pt via call to hospital room phone, however, unable to reach.   Pt with good appetite. Noted meal completions 100%. Pt is intermittently accepting of Ensure Enlive supplements.   Per TOC notes, pt awaiting SNF placement.   Medications reviewed and include colace, ferrous sulfate, and KCl.   Labs reviewed.   Diet Order:   Diet Order            Diet regular Room service appropriate? Yes; Fluid consistency: Thin  Diet effective now                 EDUCATION NEEDS:   Education needs have been addressed  Skin:  Skin Assessment: Skin Integrity Issues: Skin Integrity Issues:: Other (Comment) Incisions: closed lt hip Other: MASD lt inferior thigh  Last BM:  05/29/20  Height:   Ht Readings from Last 1 Encounters:  05/25/20 5\' 6"  (1.676 m)    Weight:   Wt Readings from Last 1 Encounters:  05/25/20 93.4 kg   BMI:  Body mass index is 33.23 kg/m.  Estimated Nutritional Needs:   Kcal:   1800-2000 kcal  Protein:  90-105 lb  Fluid:  >/= 1.6 L/day    Loistine Chance, RD, LDN, CDCES Registered Dietitian II Certified Diabetes Care and Education Specialist Please refer to [facility] for RD and/or RD on-call/weekend/after hours pager

## 2020-06-03 NOTE — Progress Notes (Signed)
Physical Therapy Treatment Patient Details Name: Amanda Sellers MRN: [id number] DOB: 12-22-1932 Today's Date: 06/03/2020    History of Present Illness 87 yo female with onset of fall at home, was brought to hosp with proximal femoral intertrochanteric fracture.  After ORIF was WBAT.  PMHx:  a-fib, Eliquis, CHF, HTN, fall    PT Comments    Pt was seen for mobility on the bed, repositioned with help and to increase comfort on LLE.  Pt is demonstrating a struggle to tolerate LE ROM, and even on RLE is restricted for ROM.  Pt is notably able to move LE's better with repeated effort, but overall quite limited for hip and knee flexion.   Follow for goals of acute PT. Pt is leaving today for rehab, and will expect her to require 2 person help for all mobility upon arrival.     Follow Up Recommendations  SNF     Equipment Recommendations  None recommended by PT    Recommendations for Other Services       Precautions / Restrictions Precautions Precautions: Fall Precaution Comments: has a great deal of edema on LE's Restrictions Weight Bearing Restrictions: Yes LLE Weight Bearing: Weight bearing as tolerated    Mobility  Bed Mobility Overal bed mobility: Needs Assistance Bed Mobility:  (repositioning on bed)           General bed mobility comments: pt requires max assist to reposition, has pain on LLE and asked for meds, able to assist wtih mobility once nursing gave meds    Transfers                 General transfer comment: declined  Ambulation/Gait                 Stairs             Wheelchair Mobility    Modified Rankin (Stroke Patients Only)       Balance                                            Cognition Arousal/Alertness: Awake/alert Behavior During Therapy: WFL for tasks assessed/performed;Anxious Overall Cognitive Status: Within Functional Limits for tasks assessed                                  General Comments: daughter is present for part of therapy      Exercises General Exercises - Lower Extremity Ankle Circles/Pumps: AAROM;5 reps Quad Sets: AROM;5 reps Gluteal Sets: AROM;10 reps Heel Slides: AAROM;10 reps Hip ABduction/ADduction: AAROM;10 reps Straight Leg Raises: AAROM;10 reps    General Comments General comments (skin integrity, edema, etc.): bruising on L thigh, painful and self restricting flexion of both legs      Pertinent Vitals/Pain Pain Assessment: Faces Faces Pain Scale: Hurts even more Pain Location: L hip Pain Descriptors / Indicators: Grimacing;Guarding Pain Intervention(s): Limited activity within patient's tolerance;Monitored during session;Premedicated before session;Repositioned;Ice applied    Home Living                      Prior Function            PT Goals (current goals can now be found in the care plan section) Acute Rehab PT Goals Patient Stated Goal: to go home Progress towards PT goals:  Progressing toward goals    Frequency    Min 3X/week      PT Plan Current plan remains appropriate    Co-evaluation              AM-PAC PT "6 Clicks" Mobility   Outcome Measure  Help needed turning from your back to your side while in a flat bed without using bedrails?: A Lot Help needed moving from lying on your back to sitting on the side of a flat bed without using bedrails?: A Lot Help needed moving to and from a bed to a chair (including a wheelchair)?: A Lot Help needed standing up from a chair using your arms (e.g., wheelchair or bedside chair)?: A Lot Help needed to walk in hospital room?: Total Help needed climbing 3-5 steps with a railing? : Total 6 Click Score: 10    End of Session   Activity Tolerance: Patient limited by pain Patient left: in bed;with call bell/phone within reach;with bed alarm set Nurse Communication: Mobility status PT Visit Diagnosis: Unsteadiness on feet (R26.81);Muscle weakness  (generalized) (M62.81);Difficulty in walking, not elsewhere classified (R26.2)     Time: 2671-2458 PT Time Calculation (min) (ACUTE ONLY): 25 min  Charges:  $Therapeutic Exercise: 8-22 mins $Therapeutic Activity: 8-22 mins                   Ramond Dial 06/03/2020, 3:02 PM Mee Hives, PT MS Acute Rehab Dept. Number: [place] and [place]

## 2020-06-03 NOTE — Progress Notes (Signed)
PTAR here @ 1945 to pick patient up, pain med given ,IV out, VSS, Patient has no questions or concerns.

## 2020-06-03 NOTE — Progress Notes (Signed)
This nurse called report to nurse Roxanne at [facility]. All questions answered at this time.

## 2020-06-06 DIAGNOSIS — I5022 Chronic systolic (congestive) heart failure: Secondary | ICD-10-CM | POA: Diagnosis not present

## 2020-06-06 DIAGNOSIS — I480 Paroxysmal atrial fibrillation: Secondary | ICD-10-CM | POA: Diagnosis not present

## 2020-06-06 DIAGNOSIS — I10 Essential (primary) hypertension: Secondary | ICD-10-CM | POA: Diagnosis not present

## 2020-06-06 DIAGNOSIS — R531 Weakness: Secondary | ICD-10-CM | POA: Diagnosis not present

## 2020-06-18 DIAGNOSIS — Z9889 Other specified postprocedural states: Secondary | ICD-10-CM | POA: Diagnosis not present

## 2020-07-07 DIAGNOSIS — I480 Paroxysmal atrial fibrillation: Secondary | ICD-10-CM | POA: Diagnosis not present

## 2020-07-07 DIAGNOSIS — I10 Essential (primary) hypertension: Secondary | ICD-10-CM | POA: Diagnosis not present

## 2020-07-07 DIAGNOSIS — R531 Weakness: Secondary | ICD-10-CM | POA: Diagnosis not present

## 2020-07-07 DIAGNOSIS — I5022 Chronic systolic (congestive) heart failure: Secondary | ICD-10-CM | POA: Diagnosis not present

## 2020-07-23 ENCOUNTER — Other Ambulatory Visit: Payer: Self-pay

## 2020-07-23 ENCOUNTER — Inpatient Hospital Stay: Payer: PPO

## 2020-07-23 ENCOUNTER — Inpatient Hospital Stay: Payer: PPO | Attending: Nurse Practitioner | Admitting: Nurse Practitioner

## 2020-07-23 ENCOUNTER — Encounter: Payer: Self-pay | Admitting: Nurse Practitioner

## 2020-07-23 ENCOUNTER — Other Ambulatory Visit: Payer: PPO

## 2020-07-23 VITALS — BP 151/61 | HR 70 | Temp 98.2°F | Resp 20 | Ht 66.0 in

## 2020-07-23 DIAGNOSIS — C8200 Follicular lymphoma grade I, unspecified site: Secondary | ICD-10-CM | POA: Diagnosis not present

## 2020-07-23 DIAGNOSIS — E538 Deficiency of other specified B group vitamins: Secondary | ICD-10-CM | POA: Diagnosis not present

## 2020-07-23 DIAGNOSIS — D509 Iron deficiency anemia, unspecified: Secondary | ICD-10-CM | POA: Insufficient documentation

## 2020-07-23 DIAGNOSIS — Z8572 Personal history of non-Hodgkin lymphomas: Secondary | ICD-10-CM | POA: Diagnosis not present

## 2020-07-23 DIAGNOSIS — Z9221 Personal history of antineoplastic chemotherapy: Secondary | ICD-10-CM | POA: Insufficient documentation

## 2020-07-23 DIAGNOSIS — K8020 Calculus of gallbladder without cholecystitis without obstruction: Secondary | ICD-10-CM

## 2020-07-23 DIAGNOSIS — K449 Diaphragmatic hernia without obstruction or gangrene: Secondary | ICD-10-CM

## 2020-07-23 HISTORY — DX: Diaphragmatic hernia without obstruction or gangrene: K44.9

## 2020-07-23 HISTORY — DX: Calculus of gallbladder without cholecystitis without obstruction: K80.20

## 2020-07-23 LAB — CBC WITH DIFFERENTIAL (CANCER CENTER ONLY)
Abs Immature Granulocytes: 0.02 10*3/uL (ref 0.00–0.07)
Basophils Absolute: 0 10*3/uL (ref 0.0–0.1)
Basophils Relative: 0 %
Eosinophils Absolute: 0.1 10*3/uL (ref 0.0–0.5)
Eosinophils Relative: 1 %
HCT: 40.1 % (ref 36.0–46.0)
Hemoglobin: 13.2 g/dL (ref 12.0–15.0)
Immature Granulocytes: 0 %
Lymphocytes Relative: 18 %
Lymphs Abs: 1 10*3/uL (ref 0.7–4.0)
MCH: 31.7 pg (ref 26.0–34.0)
MCHC: 32.9 g/dL (ref 30.0–36.0)
MCV: 96.4 fL (ref 80.0–100.0)
Monocytes Absolute: 0.6 10*3/uL (ref 0.1–1.0)
Monocytes Relative: 11 %
Neutro Abs: 3.8 10*3/uL (ref 1.7–7.7)
Neutrophils Relative %: 70 %
Platelet Count: 146 10*3/uL — ABNORMAL LOW (ref 150–400)
RBC: 4.16 MIL/uL (ref 3.87–5.11)
RDW: 13.4 % (ref 11.5–15.5)
WBC Count: 5.5 10*3/uL (ref 4.0–10.5)
nRBC: 0 % (ref 0.0–0.2)

## 2020-07-23 NOTE — Progress Notes (Signed)
  Amanda OFFICE PROGRESS NOTE   Diagnosis: Non-Hodgkin's lymphoma  INTERVAL HISTORY:   Amanda Sellers returns as scheduled.  She was hospitalized in May following a fall.  She was found to have a left intertrochanteric hip fracture and underwent surgery 05/25/2020.  She is currently residing at a nursing facility.  She is working with physical therapy.  No fevers or sweats.  Appetite is poor.  Her daughter reports she does not like the food at the facility.  She is having intermittent left lower abdominal pain.  The pain does not occur on a daily basis and at times is relieved by passing gas or having a bowel movement.  Objective:  Vital signs in last 24 hours:  Blood pressure (!) 151/61, pulse 70, temperature 98.2 F (36.8 C), temperature source Oral, resp. rate 20, height $RemoveBe'5\' 6"'MezXfYUFD$  (1.676 m), SpO2 99 %.    HEENT: No thrush or ulcers. Lymphatics: No palpable cervical, supraclavicular, axillary or inguinal lymph nodes. Resp: Lungs clear bilaterally. Cardio: Regular rate and rhythm. GI: Abdomen soft and nontender.  No hepatomegaly.  No splenomegaly.  No mass. Vascular: Pitting edema below the knees bilaterally.   Lab Results:  Lab Results  Component Value Date   WBC 5.5 07/23/2020   HGB 13.2 07/23/2020   HCT 40.1 07/23/2020   MCV 96.4 07/23/2020   PLT 146 (L) 07/23/2020   NEUTROABS 3.8 07/23/2020    Imaging:  No results found.  Medications: I have reviewed the patient's current medications.  Assessment/Plan: 1.Non-Hodgkin's Lymphoma-bone marrow biopsy 07/24/2014 consistent with involvement by follicular B-cell lymphoma, CD20 positive   CTs of the chest 07/22/2014 and CT of the abdomen and pelvis on 07/23/2014-pulmonary nodules, hilar/mediastinal/supraclavicular/axillary adenopathy, splenomegaly, abdominal adenopathy, bilateral adrenal nodules   Cycle 1 bendamustine/rituximab 08/04/2014 Cycle 2 bendamustine/Rituxan 09/01/2014 Cycle 3 bendamustine/Rituxan  09/30/2014 Cycle 4 bendamustine/rituximab 10/27/2014 Restaging CT scans 11/17/2014 with significant improvement in diffuse lymphadenopathy, lung nodules, and renal lesions 2. Severe microcytic anemia, status post transfusion with packed red blood cells 07/23/2014   Normal ferritin, low transferrin saturation, "scant" bone marrow iron stores, review of peripheral blood smear consistent with iron deficiency anemia   colonoscopy and upper endoscopy 09/23/2014 3. Exertional dyspnea /orthopnea-most likely secondary to congestive heart failure   4. History of B-12 deficiency   5. Rash over the trunk and extremities 08/18/2014. Appears consistent with a drug rash. Resolved 08/28/2014. Rash 11/18/2014, likely secondary to a contrast allergy  , diagnostic contrast will be listed as an allergy 6. Colonoscopy 06/03/2008. Medium sized internal hemorrhoids.   7. Admission with atrial flutter with a rapid ventricular response 10/15/2014-status post cardioversion 10/16/2014, maintained on     apixaban Repeat cardioversion 11/30/2014   8. Right olecranon skin lesion-likely a lipoma or cyst 9. 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  She is recovering from the recent hip fracture/surgery.  We will see her in follow-up in approximately 8 months.  She will contact the office in the interim with any problems.  Patient seen with Dr. Benay Spice.    Ned Card ANP/GNP-BC   07/23/2020  3:09 PM  This was a shared visit with Ned Card.  Amanda Sellers was interviewed and examined.  She remains in remission from non-Hodgkin's lymphoma.  I was present for greater than 50% of today's visit.  I performed medical decision making.  Amanda Manson, MD

## 2020-07-26 ENCOUNTER — Other Ambulatory Visit: Payer: Self-pay | Admitting: Family Medicine

## 2020-07-26 DIAGNOSIS — Z23 Encounter for immunization: Secondary | ICD-10-CM

## 2020-07-30 DIAGNOSIS — Z9889 Other specified postprocedural states: Secondary | ICD-10-CM | POA: Diagnosis not present

## 2020-08-06 DIAGNOSIS — I5022 Chronic systolic (congestive) heart failure: Secondary | ICD-10-CM | POA: Diagnosis not present

## 2020-08-06 DIAGNOSIS — I10 Essential (primary) hypertension: Secondary | ICD-10-CM | POA: Diagnosis not present

## 2020-08-06 DIAGNOSIS — I480 Paroxysmal atrial fibrillation: Secondary | ICD-10-CM | POA: Diagnosis not present

## 2020-08-22 DIAGNOSIS — Z886 Allergy status to analgesic agent status: Secondary | ICD-10-CM | POA: Diagnosis not present

## 2020-08-22 DIAGNOSIS — J449 Chronic obstructive pulmonary disease, unspecified: Secondary | ICD-10-CM | POA: Diagnosis not present

## 2020-08-22 DIAGNOSIS — R441 Visual hallucinations: Secondary | ICD-10-CM | POA: Diagnosis not present

## 2020-08-22 DIAGNOSIS — N183 Chronic kidney disease, stage 3 unspecified: Secondary | ICD-10-CM | POA: Diagnosis not present

## 2020-08-22 DIAGNOSIS — I5032 Chronic diastolic (congestive) heart failure: Secondary | ICD-10-CM | POA: Diagnosis not present

## 2020-08-22 DIAGNOSIS — Z8572 Personal history of non-Hodgkin lymphomas: Secondary | ICD-10-CM | POA: Diagnosis not present

## 2020-08-22 DIAGNOSIS — R410 Disorientation, unspecified: Secondary | ICD-10-CM | POA: Diagnosis not present

## 2020-08-22 DIAGNOSIS — Z20822 Contact with and (suspected) exposure to covid-19: Secondary | ICD-10-CM | POA: Diagnosis not present

## 2020-08-22 DIAGNOSIS — E785 Hyperlipidemia, unspecified: Secondary | ICD-10-CM | POA: Diagnosis not present

## 2020-08-22 DIAGNOSIS — I959 Hypotension, unspecified: Secondary | ICD-10-CM | POA: Diagnosis not present

## 2020-08-22 DIAGNOSIS — K5730 Diverticulosis of large intestine without perforation or abscess without bleeding: Secondary | ICD-10-CM | POA: Diagnosis not present

## 2020-08-22 DIAGNOSIS — G9341 Metabolic encephalopathy: Secondary | ICD-10-CM | POA: Diagnosis not present

## 2020-08-22 DIAGNOSIS — N179 Acute kidney failure, unspecified: Secondary | ICD-10-CM | POA: Diagnosis not present

## 2020-08-22 DIAGNOSIS — Z66 Do not resuscitate: Secondary | ICD-10-CM | POA: Diagnosis not present

## 2020-08-22 DIAGNOSIS — E039 Hypothyroidism, unspecified: Secondary | ICD-10-CM | POA: Diagnosis not present

## 2020-08-22 DIAGNOSIS — D510 Vitamin B12 deficiency anemia due to intrinsic factor deficiency: Secondary | ICD-10-CM | POA: Diagnosis not present

## 2020-08-22 DIAGNOSIS — L89629 Pressure ulcer of left heel, unspecified stage: Secondary | ICD-10-CM | POA: Diagnosis not present

## 2020-08-22 DIAGNOSIS — Z79899 Other long term (current) drug therapy: Secondary | ICD-10-CM | POA: Diagnosis not present

## 2020-08-22 DIAGNOSIS — I480 Paroxysmal atrial fibrillation: Secondary | ICD-10-CM | POA: Diagnosis not present

## 2020-08-22 DIAGNOSIS — Z7901 Long term (current) use of anticoagulants: Secondary | ICD-10-CM | POA: Diagnosis not present

## 2020-08-22 DIAGNOSIS — R4182 Altered mental status, unspecified: Secondary | ICD-10-CM | POA: Diagnosis not present

## 2020-08-22 DIAGNOSIS — R609 Edema, unspecified: Secondary | ICD-10-CM | POA: Diagnosis not present

## 2020-08-22 DIAGNOSIS — K8020 Calculus of gallbladder without cholecystitis without obstruction: Secondary | ICD-10-CM | POA: Diagnosis not present

## 2020-08-22 DIAGNOSIS — R600 Localized edema: Secondary | ICD-10-CM | POA: Insufficient documentation

## 2020-08-22 DIAGNOSIS — K8010 Calculus of gallbladder with chronic cholecystitis without obstruction: Secondary | ICD-10-CM | POA: Diagnosis not present

## 2020-08-22 DIAGNOSIS — I4891 Unspecified atrial fibrillation: Secondary | ICD-10-CM | POA: Diagnosis not present

## 2020-08-22 DIAGNOSIS — I38 Endocarditis, valve unspecified: Secondary | ICD-10-CM | POA: Insufficient documentation

## 2020-08-22 DIAGNOSIS — S72002D Fracture of unspecified part of neck of left femur, subsequent encounter for closed fracture with routine healing: Secondary | ICD-10-CM | POA: Diagnosis not present

## 2020-08-22 DIAGNOSIS — K449 Diaphragmatic hernia without obstruction or gangrene: Secondary | ICD-10-CM | POA: Diagnosis not present

## 2020-08-22 DIAGNOSIS — R059 Cough, unspecified: Secondary | ICD-10-CM | POA: Diagnosis not present

## 2020-08-22 DIAGNOSIS — N200 Calculus of kidney: Secondary | ICD-10-CM | POA: Diagnosis not present

## 2020-08-22 DIAGNOSIS — Z91041 Radiographic dye allergy status: Secondary | ICD-10-CM | POA: Diagnosis not present

## 2020-08-22 DIAGNOSIS — L97221 Non-pressure chronic ulcer of left calf limited to breakdown of skin: Secondary | ICD-10-CM | POA: Diagnosis not present

## 2020-08-22 DIAGNOSIS — I130 Hypertensive heart and chronic kidney disease with heart failure and stage 1 through stage 4 chronic kidney disease, or unspecified chronic kidney disease: Secondary | ICD-10-CM | POA: Diagnosis not present

## 2020-08-22 DIAGNOSIS — L89619 Pressure ulcer of right heel, unspecified stage: Secondary | ICD-10-CM | POA: Diagnosis not present

## 2020-08-23 DIAGNOSIS — I959 Hypotension, unspecified: Secondary | ICD-10-CM | POA: Diagnosis not present

## 2020-08-23 DIAGNOSIS — Z7901 Long term (current) use of anticoagulants: Secondary | ICD-10-CM | POA: Diagnosis not present

## 2020-08-23 DIAGNOSIS — R4182 Altered mental status, unspecified: Secondary | ICD-10-CM | POA: Diagnosis not present

## 2020-08-23 DIAGNOSIS — I5032 Chronic diastolic (congestive) heart failure: Secondary | ICD-10-CM | POA: Diagnosis not present

## 2020-08-23 DIAGNOSIS — D329 Benign neoplasm of meninges, unspecified: Secondary | ICD-10-CM | POA: Diagnosis not present

## 2020-08-23 DIAGNOSIS — Z79899 Other long term (current) drug therapy: Secondary | ICD-10-CM | POA: Diagnosis not present

## 2020-08-23 DIAGNOSIS — S72002D Fracture of unspecified part of neck of left femur, subsequent encounter for closed fracture with routine healing: Secondary | ICD-10-CM | POA: Diagnosis not present

## 2020-08-23 DIAGNOSIS — I4891 Unspecified atrial fibrillation: Secondary | ICD-10-CM | POA: Diagnosis not present

## 2020-08-23 DIAGNOSIS — R600 Localized edema: Secondary | ICD-10-CM | POA: Diagnosis not present

## 2020-08-23 DIAGNOSIS — N179 Acute kidney failure, unspecified: Secondary | ICD-10-CM | POA: Diagnosis not present

## 2020-08-24 DIAGNOSIS — I5032 Chronic diastolic (congestive) heart failure: Secondary | ICD-10-CM | POA: Diagnosis not present

## 2020-08-24 DIAGNOSIS — Z7901 Long term (current) use of anticoagulants: Secondary | ICD-10-CM | POA: Diagnosis not present

## 2020-08-24 DIAGNOSIS — I959 Hypotension, unspecified: Secondary | ICD-10-CM | POA: Diagnosis not present

## 2020-08-24 DIAGNOSIS — N179 Acute kidney failure, unspecified: Secondary | ICD-10-CM | POA: Diagnosis not present

## 2020-08-24 DIAGNOSIS — R4182 Altered mental status, unspecified: Secondary | ICD-10-CM | POA: Diagnosis not present

## 2020-08-24 DIAGNOSIS — I4891 Unspecified atrial fibrillation: Secondary | ICD-10-CM | POA: Diagnosis not present

## 2020-08-24 DIAGNOSIS — R600 Localized edema: Secondary | ICD-10-CM | POA: Diagnosis not present

## 2020-08-24 DIAGNOSIS — S72002D Fracture of unspecified part of neck of left femur, subsequent encounter for closed fracture with routine healing: Secondary | ICD-10-CM | POA: Diagnosis not present

## 2020-08-24 DIAGNOSIS — Z79899 Other long term (current) drug therapy: Secondary | ICD-10-CM | POA: Diagnosis not present

## 2020-08-25 DIAGNOSIS — G9341 Metabolic encephalopathy: Secondary | ICD-10-CM | POA: Diagnosis not present

## 2020-08-25 DIAGNOSIS — Z79899 Other long term (current) drug therapy: Secondary | ICD-10-CM | POA: Diagnosis not present

## 2020-08-25 DIAGNOSIS — R609 Edema, unspecified: Secondary | ICD-10-CM | POA: Diagnosis not present

## 2020-08-25 DIAGNOSIS — I509 Heart failure, unspecified: Secondary | ICD-10-CM | POA: Diagnosis not present

## 2020-08-25 DIAGNOSIS — S72002D Fracture of unspecified part of neck of left femur, subsequent encounter for closed fracture with routine healing: Secondary | ICD-10-CM | POA: Diagnosis not present

## 2020-08-25 DIAGNOSIS — N189 Chronic kidney disease, unspecified: Secondary | ICD-10-CM | POA: Diagnosis not present

## 2020-08-25 DIAGNOSIS — N179 Acute kidney failure, unspecified: Secondary | ICD-10-CM | POA: Diagnosis not present

## 2020-08-25 DIAGNOSIS — Z7901 Long term (current) use of anticoagulants: Secondary | ICD-10-CM | POA: Diagnosis not present

## 2020-08-25 DIAGNOSIS — I38 Endocarditis, valve unspecified: Secondary | ICD-10-CM | POA: Diagnosis not present

## 2020-08-25 DIAGNOSIS — I959 Hypotension, unspecified: Secondary | ICD-10-CM | POA: Diagnosis not present

## 2020-08-25 DIAGNOSIS — I480 Paroxysmal atrial fibrillation: Secondary | ICD-10-CM | POA: Diagnosis not present

## 2020-08-25 DIAGNOSIS — I5032 Chronic diastolic (congestive) heart failure: Secondary | ICD-10-CM | POA: Diagnosis not present

## 2020-09-03 ENCOUNTER — Telehealth: Payer: Self-pay

## 2020-09-03 DIAGNOSIS — Z471 Aftercare following joint replacement surgery: Secondary | ICD-10-CM

## 2020-09-03 DIAGNOSIS — Z96642 Presence of left artificial hip joint: Secondary | ICD-10-CM

## 2020-09-03 DIAGNOSIS — R531 Weakness: Secondary | ICD-10-CM

## 2020-09-03 DIAGNOSIS — Z8781 Personal history of (healed) traumatic fracture: Secondary | ICD-10-CM

## 2020-09-03 DIAGNOSIS — R5381 Other malaise: Secondary | ICD-10-CM

## 2020-09-03 DIAGNOSIS — R2681 Unsteadiness on feet: Secondary | ICD-10-CM

## 2020-09-03 NOTE — Telephone Encounter (Signed)
Please advise if referral appropriate prior to appt 08/19, pt's daughter states pt has went 2 weeks without therapy and struggling to walk.   Progress Note - Non-Provider - Melton Krebs, RN - 08/25/2020 1:20 PM EDT Formatting of this note might be different from the original. [facility] has agreed to take patient for PT/OT/RN/SW. Daughter was notified by Romualdo Bolk, RN, [facility].

## 2020-09-03 NOTE — Telephone Encounter (Signed)
Patient daughter Adonis Brook called regarding PT / OT therapy for patient.  She was discharged and put back in hospital and home health requests are required again.  Please call Adonis Brook at [phone] until 4:30pm after 4:30pm you can reach her at [phone]

## 2020-09-03 NOTE — Telephone Encounter (Signed)
Colletta Maryland calling from [facility] [place] She is wanting to know if Dr. Anitra Lauth will be the attending physician for patient for Palliative Care. (Not hospice care)  Please call Colletta Maryland at [phone]

## 2020-09-03 NOTE — Telephone Encounter (Signed)
Yes referral appropriate-thx

## 2020-09-03 NOTE — Telephone Encounter (Signed)
I'll be "attending physician" but not doing orders or care management decisions in regard to her palliative care.  I defer this to her palliative care providers.

## 2020-09-03 NOTE — Telephone Encounter (Signed)
Tried [facility], phone just continuously rang.

## 2020-09-03 NOTE — Telephone Encounter (Signed)
Please Advise

## 2020-09-06 ENCOUNTER — Encounter: Payer: Self-pay | Admitting: Family Medicine

## 2020-09-06 ENCOUNTER — Telehealth: Payer: Self-pay | Admitting: Family Medicine

## 2020-09-06 ENCOUNTER — Other Ambulatory Visit (HOSPITAL_COMMUNITY): Payer: Self-pay

## 2020-09-06 MED ORDER — APIXABAN 5 MG PO TABS: 5.0000 mg | ORAL_TABLET | Freq: Two times a day (BID) | ORAL | 0 refills | Status: DC

## 2020-09-06 NOTE — Telephone Encounter (Signed)
Spoke with Colletta Maryland to advise PCP attending only, she will be faxing over written order for PCP to sign stating this as well. Fax number provided

## 2020-09-06 NOTE — Telephone Encounter (Signed)
Please advise of dx to use for referral, will enter referral

## 2020-09-06 NOTE — Telephone Encounter (Signed)
Form completed and placed on your desk

## 2020-09-06 NOTE — Telephone Encounter (Signed)
See other phone note, VO given for PCP to be attending physician only. Placed on PCP desk to review and sign, if appropriate.

## 2020-09-06 NOTE — Telephone Encounter (Signed)
Referral ordered to [facility] for PT/OT using dx provided. Pt's daughter, Kristie(DPR) advised of  referral update. They should be calling within the next week for initial visit and assessment.

## 2020-09-06 NOTE — Telephone Encounter (Signed)
Received faxed Serious Illness Care Referral from [facility]. Placed in Dr. Idelle Leech front office inbox to be signed and returned.

## 2020-09-06 NOTE — Telephone Encounter (Signed)
Ok to order. Dx; history of left hip fracture.  History of left hip surgery.  Debilitated patient.  Generalized weakness.  Gait instability.

## 2020-09-07 NOTE — Telephone Encounter (Signed)
Faxed completed form to [facility]. Fax confirmation received.

## 2020-09-07 NOTE — Telephone Encounter (Signed)
Nothing further needed at this time. 

## 2020-09-10 ENCOUNTER — Encounter: Payer: Self-pay | Admitting: Family Medicine

## 2020-09-10 ENCOUNTER — Other Ambulatory Visit: Payer: Self-pay

## 2020-09-10 ENCOUNTER — Ambulatory Visit (INDEPENDENT_AMBULATORY_CARE_PROVIDER_SITE_OTHER): Payer: PPO | Admitting: Family Medicine

## 2020-09-10 VITALS — BP 133/64 | HR 62 | Temp 98.1°F | Ht 66.0 in

## 2020-09-10 DIAGNOSIS — I952 Hypotension due to drugs: Secondary | ICD-10-CM

## 2020-09-10 DIAGNOSIS — I5032 Chronic diastolic (congestive) heart failure: Secondary | ICD-10-CM | POA: Diagnosis not present

## 2020-09-10 DIAGNOSIS — I480 Paroxysmal atrial fibrillation: Secondary | ICD-10-CM | POA: Diagnosis not present

## 2020-09-10 DIAGNOSIS — Z993 Dependence on wheelchair: Secondary | ICD-10-CM | POA: Diagnosis not present

## 2020-09-10 DIAGNOSIS — I38 Endocarditis, valve unspecified: Secondary | ICD-10-CM | POA: Diagnosis not present

## 2020-09-10 DIAGNOSIS — L89622 Pressure ulcer of left heel, stage 2: Secondary | ICD-10-CM | POA: Diagnosis not present

## 2020-09-10 DIAGNOSIS — M79605 Pain in left leg: Secondary | ICD-10-CM | POA: Diagnosis not present

## 2020-09-10 DIAGNOSIS — M79604 Pain in right leg: Secondary | ICD-10-CM | POA: Diagnosis not present

## 2020-09-10 DIAGNOSIS — I83014 Varicose veins of right lower extremity with ulcer of heel and midfoot: Secondary | ICD-10-CM | POA: Diagnosis not present

## 2020-09-10 DIAGNOSIS — R279 Unspecified lack of coordination: Secondary | ICD-10-CM | POA: Diagnosis not present

## 2020-09-10 DIAGNOSIS — R600 Localized edema: Secondary | ICD-10-CM | POA: Diagnosis not present

## 2020-09-10 DIAGNOSIS — L89612 Pressure ulcer of right heel, stage 2: Secondary | ICD-10-CM | POA: Diagnosis not present

## 2020-09-10 DIAGNOSIS — N189 Chronic kidney disease, unspecified: Secondary | ICD-10-CM | POA: Diagnosis not present

## 2020-09-10 DIAGNOSIS — N179 Acute kidney failure, unspecified: Secondary | ICD-10-CM

## 2020-09-10 DIAGNOSIS — L97419 Non-pressure chronic ulcer of right heel and midfoot with unspecified severity: Secondary | ICD-10-CM

## 2020-09-10 DIAGNOSIS — L89892 Pressure ulcer of other site, stage 2: Secondary | ICD-10-CM | POA: Diagnosis not present

## 2020-09-10 DIAGNOSIS — Z8781 Personal history of (healed) traumatic fracture: Secondary | ICD-10-CM

## 2020-09-10 DIAGNOSIS — I130 Hypertensive heart and chronic kidney disease with heart failure and stage 1 through stage 4 chronic kidney disease, or unspecified chronic kidney disease: Secondary | ICD-10-CM | POA: Diagnosis not present

## 2020-09-10 DIAGNOSIS — M79671 Pain in right foot: Secondary | ICD-10-CM

## 2020-09-10 DIAGNOSIS — Z7901 Long term (current) use of anticoagulants: Secondary | ICD-10-CM | POA: Diagnosis not present

## 2020-09-10 DIAGNOSIS — Z9981 Dependence on supplemental oxygen: Secondary | ICD-10-CM | POA: Diagnosis not present

## 2020-09-10 DIAGNOSIS — M25552 Pain in left hip: Secondary | ICD-10-CM | POA: Diagnosis not present

## 2020-09-10 DIAGNOSIS — Z79891 Long term (current) use of opiate analgesic: Secondary | ICD-10-CM | POA: Diagnosis not present

## 2020-09-10 MED ORDER — HYDROCODONE-ACETAMINOPHEN 5-325 MG PO TABS: 1.0000 | ORAL_TABLET | Freq: Four times a day (QID) | ORAL | 0 refills | Status: DC | PRN

## 2020-09-10 MED ORDER — HYDROCODONE-ACETAMINOPHEN 5-325 MG PO TABS: ORAL_TABLET | ORAL | 0 refills | Status: DC

## 2020-09-10 NOTE — Patient Instructions (Signed)
Don't restart entresto.

## 2020-09-10 NOTE — Progress Notes (Signed)
09/10/2020  CC:  Chief Complaint  Patient presents with   Hospitalization Follow-up    Patient is a 87 y.o. Caucasian female who presents accompanied by her daughter Jenny Reichmann for hospital follow up. In [place] 08/22/20 - 08/25/20 for AMS, d/c'd to home with [facility] orders. Date of interactive (phone) contact with patient and/or caregiver: 08/27/20.  I have reviewed patient's discharge summary plus pertinent specific notes, labs, and imaging from the hospitalization.   She had hypotension of unknown etiology, was found to have AKI.  LL's very edematous, EF 55%.  Delene Loll was held, bp went into low normal range so it was held at d/c.  She was d/c'd home on lasix 40 qd.  Unclear what dosing she had been on prior to hosp b/c she was admitted from the rehab center.   Admission sCr 1.79, d/c sCr 1.19.  Electrolytes normal. Albumin 2.8, TPro 4.5.  Hepatic panel normal.  Lipase 291 (CT abd neg for pancreatitis or cholecystitis). Pro-BNP 2877.  Troponins ok. TSH 9.16.  UA normal.  Bld cultures neg.  EKG on 08/23/20 showed a-fib with RVR. CXR no acute cp process.  CT head: neg acute.  CURRENTLY: Signif LE edema with weeping persists, pt in a lot of pain in both LL's, also R heel ulcer. She tries to avoid Na, elevates legs periodically. [facility] to do PT eval later today.   Home bp's 118-130 syst, low 60s diast, HR 59.  Oxygen mid 90s. She's not able to get out of her bed or WC w/out full assistance since going home. In rehab, prior to this recent admission, she had progressed well and was walking with her walker.  Hip not hurting her at all.  Medication reconciliation was done today and patient is taking meds as recommended by discharging hospitalist/specialist.    PMH:  Past Medical History:  Diagnosis Date   Anemia of chronic disease    Anticoagulation adequate, Eliquis 10/16/2014   Anxiety    clonaz helps this AND her breathing   Chronic combined systolic and diastolic congestive  heart failure ([place]) 27/25/3664   Complicated by atrial fibrillation; Echo 07/23/14: mild LVH, EF 50-55%, Gr 2 DD.;  Echo May 15, 2019: EF 35 to 40%, GRII DD.  (In setting of CHF exacerbation, class III)-> baseline class II symptoms. 09/2019 EF 55-60%, nl wall motion and LV fxn, grd II DD, mod MR.   Chronic renal insufficiency, stage III (moderate) (HCC)    GFR @ 50   COPD (chronic obstructive pulmonary disease) ([place])    recently noted on Xray, does not have any problems   Follicular lymphoma ([place])    Non Hodgkins B cell lymphoma; s/p chemo summer 2016--in remission still as of 09/2018: continue obs q 20mo  HLD (hyperlipidemia)     06/2014   HTN (hypertension)    Hypothyroidism    IFG (impaired fasting glucose)    Lip cancer    Dr. BJanace Hoardexcised this: invasive SCC--no sign of cancer at ENT f/u 10/2015   Microcytic anemia    transfused 3 U total in hosp 07/2014   Obesity (BMI 30-39.9)    Pancytopenia due to antineoplastic chemotherapy ([place]    Persistent atrial fibrillation ([place]    With RVR; elec cardioversion 11/30/14.  On Amio since 10/2014; Dr. NLenna Gilfordfollowing her from pulm standpoint regarding this med.   Positive occult stool blood test 08/08/2014   Endoscopies ok 08/2014   Primary osteoarthritis of both knees 04/2016   Dr. GGladstone Lighter  bone on bone--to get euflexxa to both knees.   Pulmonary hypertension due to left heart disease ([place])    RLCP 05/19/2019: EF 35-40%. D1 50%, RI 70%, dLM-LAD 30%.  mPAP 43 mmHg - (62/19 mmHg), PCWP  28 mmHg (V wave 41 mmHg) LVEDP 34 mmHg   Pulmonary metastases (HCC) 07/24/2014   S/p left hip fracture 05/2020   ORIF   Vitamin B 12 deficiency    Vitamin D deficiency    dose increased to 50 K U tab TWICE weekly 04/2017    PSH:  Past Surgical History:  Procedure Laterality Date   ABDOMINAL HYSTERECTOMY  1978   BONE MARROW BIOPSY  07/24/14   BREAST BIOPSY     CARDIOVERSION N/A 10/16/2014   Procedure: CARDIOVERSION;  Surgeon: Lelon Perla, MD;  Location:  [facility] ENDOSCOPY;  Service: Cardiovascular;  Laterality: N/A;   CARDIOVERSION N/A 11/30/2014   Procedure: CARDIOVERSION;  Surgeon: Larey Dresser, MD;  Location: [place];  Service: Cardiovascular;  Laterality: N/A;   CATARACT EXTRACTION  2020   OU: Dr. Katy Fitch   COLONOSCOPY  09/23/14   small hemorrhoids, otherwise normal (performed for IDA and heme+ stool)   COLONOSCOPY     INTRAMEDULLARY (IM) NAIL INTERTROCHANTERIC Left 05/25/2020   Procedure: INTRAMEDULLARY (IM) NAIL INTERTROCHANTRIC;  Surgeon: Erle Crocker, MD;  Location: [place];  Service: Orthopedics;  Laterality: Left;   MASS EXCISION Left 07/15/2015   Left lower lip mass--invasive SCC w/negative margins.  Procedure: EXCISION MASS;  Surgeon: Melissa Montane, MD;  Location: [place];  Service: ENT;  Laterality: Left;  Wedge excision left lower lip mass   NM MYOVIEW LTD  10/29/2014   medium size mild surgery defect in the mid anterior and apical anterior location suggestive of breast attenuation. No reversibility. LOW RISK   PFTs  02/2015   Restriction with diffusion defect: cardiologist referred her to pulm to help interpret PFTs and decide whether she has amiodarone toxicity   RIGHT/LEFT HEART CATH AND CORONARY ANGIOGRAPHY N/A 05/19/2019   Procedure: RIGHT/LEFT HEART CATH AND CORONARY ANGIOGRAPHY;  Surgeon: Troy Sine, MD;  Location: [place] CV LAB;; EF 35-40%. D1 50%, RI 70%, dLM-LAD 30%.  mPAP 43 mmHg - (62/19 mmHg), PCWP  28 mmHg (V wave 41 mmHg) LVEDP 34 mmHg   TEE WITHOUT CARDIOVERSION N/A 10/16/2014   Procedure: TRANSESOPHAGEAL ECHOCARDIOGRAM (TEE);  Surgeon: Lelon Perla, MD;  Location: [facility] ENDOSCOPY;  Service: Cardiovascular;  Laterality: N/A;   TRANSTHORACIC ECHOCARDIOGRAM  10/09/2019   EF 55 to 60%.  GRII DD.  No R WMA.  Normal PAP and RAP.  Severe LA dilation.  Mild RA dilation.  Moderate MR (likely related to annular dilation in the setting of severe LAE.  Mild aortic valve sclerosis-no stenosis.   TRANSTHORACIC  ECHOCARDIOGRAM  05/15/2019   Acute CHF exacerbation:  EF 35-40%.  Moderately reduced function.  (Previous EF was 55 to 60%) global HK.  GRII DD.  Mild LA dilation.  Normal RV size.   UPPER GI ENDOSCOPY  09/23/2014   small hiatus hernia, nodules in stomach biopsied (chronic active erosive atrophic gastritis with intestinal metaplasia--no dysplasia or malignancy) otherwise normal  EXAM:  Vitals with BMI 09/10/2020 07/23/2020 06/03/2020  Height '5\' 6"'  '5\' 6"'  -  Weight (No Data) (No Data) -  BMI - - -  Systolic 465 681 275  Diastolic 64 61 56  Pulse 62 70 67   Gen: Alert, tired but nontoxic appearing.  Patient is oriented to person, place, time, and  situation. AFFECT: pleasant, lucid thought and speech. FUX:NATF: no injection, icteris, swelling, or exudate.  EOMI, PERRLA. Mouth: lips without lesion/swelling.  Oral mucosa pink and moist. Oropharynx without erythema, exudate, or swelling.  CV: RRR, no m/r/g.   LUNGS: CTA bilat, nonlabored resps, good aeration in all lung fields. EXT: 4 + bilat LL pitting edema from knees down to toes.  A couple of small stage I ulcers on upper aspect of R LL.  One large stage 1 ulceration on back of R heel. Weeping from heel ulcer--clear.  MEDS:  Outpatient Medications Prior to Visit  Medication Sig Dispense Refill   acetaminophen (TYLENOL) 500 MG tablet Take 1,000 mg by mouth every 6 (six) hours as needed for moderate pain or headache.     amiodarone (PACERONE) 200 MG tablet TAKE ONE TABLET BY MOUTH EVERY DAY. (Patient taking differently: Take 200 mg by mouth daily.) 90 tablet 3   apixaban (ELIQUIS) 5 MG TABS tablet Take 1 tablet (5 mg total) by mouth 2 (two) times daily. 56 tablet 0   cyanocobalamin (,VITAMIN B-12,) 1000 MCG/ML injection INJECT 1 ML INTO THE SKIN EVERY 30 DAYS. 3 mL 3   diclofenac Sodium (VOLTAREN) 1 % GEL Apply 2 g topically 4 (four) times daily. Apply to LE joints that are in pain.     ferrous sulfate 325 (65 FE) MG tablet Take 1 tablet (325 mg  total) by mouth 2 (two) times daily with a meal. 30 tablet 1   fish oil-omega-3 fatty acids 1000 MG capsule Take 2 g by mouth daily.     fluticasone (CUTIVATE) 0.05 % cream Apply to affected area bid prn (Patient taking differently: Apply 1 application topically 2 (two) times daily as needed (itching).) 30 g 2   furosemide (LASIX) 40 MG tablet TAKE 1 TABLET BY MOUTH EVERY DAY (Patient taking differently: Take 40 mg by mouth daily.) 90 tablet 1   levothyroxine (SYNTHROID) 100 MCG tablet TAKE 1 TABLET BY MOUTH EVERY DAY BEFORE BREAKFAST (Patient taking differently: Take 100 mcg by mouth daily before breakfast.) 90 tablet 1   OXYGEN Inhale into the lungs. 1Lt at bedtime  as needed     Potassium Chloride ER 20 MEQ TBCR TAKE 1 TABLET BY MOUTH EVERY DAY (Patient taking differently: Take 20 mEq by mouth daily.) 90 tablet 1   rosuvastatin (CRESTOR) 20 MG tablet TAKE 1 TABLET BY MOUTH EVERY DAY (Patient taking differently: Take 20 mg by mouth daily.) 90 tablet 3   Vitamin D, Ergocalciferol, (DRISDOL) 1.25 MG (50000 UNIT) CAPS capsule TAKE 1 CAPSULE (50,000 UNITS TOTAL) BY MOUTH 2 (TWO) TIMES A WEEK. (Patient taking differently: Take 50,000 Units by mouth 2 (two) times a week. Sunday and thursday) 24 capsule 3   polyethylene glycol (MIRALAX / GLYCOLAX) 17 g packet Take 17 g by mouth daily. (Patient not taking: Reported on 09/10/2020)     sacubitril-valsartan (ENTRESTO) 24-26 MG Take 1 tablet by mouth 2 (two) times daily. (Patient not taking: Reported on 09/10/2020) 60 tablet 11   clonazePAM (KLONOPIN) 0.5 MG tablet Take 0.5-1 tablets (0.25-0.5 mg total) by mouth 2 (two) times daily as needed for anxiety. (Patient not taking: Reported on 09/10/2020) 10 tablet 0   oxyCODONE-acetaminophen (PERCOCET) 5-325 MG tablet Take 1-2 tablets by mouth every 4 (four) hours as needed for moderate pain or severe pain. (Patient not taking: Reported on 09/10/2020) 24 tablet 0   No facility-administered medications prior to visit.     Pertinent labs/imaging Lab Results  Component Value  Date   TSH 2.83 02/23/2020   Lab Results  Component Value Date   WBC 5.5 07/23/2020   HGB 13.2 07/23/2020   HCT 40.1 07/23/2020   MCV 96.4 07/23/2020   PLT 146 (L) 07/23/2020   Lab Results  Component Value Date   IRON 44 10/09/2016   TIBC 379 10/09/2016   FERRITIN 32 10/09/2016   Lab Results  Component Value Date   VITAMINB12 637 09/30/2019    Lab Results  Component Value Date   CREATININE 0.82 05/31/2020   BUN 21 05/31/2020   NA 135 05/31/2020   K 4.6 05/31/2020   CL 103 05/31/2020   CO2 25 05/31/2020   Lab Results  Component Value Date   ALT 13 05/30/2020   AST 24 05/30/2020   ALKPHOS 30 (L) 05/30/2020   BILITOT 1.0 05/30/2020   Lab Results  Component Value Date   CHOL 176 02/23/2020   Lab Results  Component Value Date   HDL 71.00 02/23/2020   Lab Results  Component Value Date   LDLCALC 74 02/23/2020   Lab Results  Component Value Date   TRIG 158.0 (H) 02/23/2020   Lab Results  Component Value Date   CHOLHDL 2 02/23/2020   Lab Results  Component Value Date   HGBA1C 5.5 02/23/2020   ASSESSMENT/PLAN:  1) Hypotension: better since hosp but not to the level of restarting entresto in my opinion.  2) Acute kidney injury superimposed on CRI III (baseline sCr 0.9-1.1, GFR mid 50s). Her sCr was 1.2 upon d/c home. BMET today.   3) Bilat LE edema: chronic venous insufficiency/lymphedema. She has some small stage I venous stasis ulcers, one stasis + pressure ulcer R heel. This is all causing her signif pain.  Lasix 40 qd currently. Will see if BP and renal function will allow [place] to push diuretic more. In the meantime, needs to cont Na limitation and elevation of legs.  Hopefully [facility] nursing can do UNNA boots.  4) PAF: normal rhythm, normal rate today. Cont amiodarone and eliquis. CBC upon d/c home normal.  5) L hip intertrochanteric hip fx and subsequent surgery 05/2020. She was doing  great in rehab before this recent set-back/hospitalization.  FOLLOW UP:  1-2 wks  Signed:  Crissie Sickles, MD           09/10/2020

## 2020-09-11 LAB — COMPREHENSIVE METABOLIC PANEL
AG Ratio: 2.7 (calc) — ABNORMAL HIGH (ref 1.0–2.5)
ALT: 13 U/L (ref 6–29)
AST: 19 U/L (ref 10–35)
Albumin: 4 g/dL (ref 3.6–5.1)
Alkaline phosphatase (APISO): 49 U/L (ref 37–153)
BUN: 19 mg/dL (ref 7–25)
CO2: 30 mmol/L (ref 20–32)
Calcium: 8.9 mg/dL (ref 8.6–10.4)
Chloride: 105 mmol/L (ref 98–110)
Creat: 0.95 mg/dL (ref 0.60–0.95)
Globulin: 1.5 g/dL (calc) — ABNORMAL LOW (ref 1.9–3.7)
Glucose, Bld: 110 mg/dL — ABNORMAL HIGH (ref 65–99)
Potassium: 4.5 mmol/L (ref 3.5–5.3)
Sodium: 143 mmol/L (ref 135–146)
Total Bilirubin: 0.7 mg/dL (ref 0.2–1.2)
Total Protein: 5.5 g/dL — ABNORMAL LOW (ref 6.1–8.1)

## 2020-09-13 ENCOUNTER — Encounter: Payer: Self-pay | Admitting: Family Medicine

## 2020-09-13 ENCOUNTER — Telehealth: Payer: Self-pay

## 2020-09-13 DIAGNOSIS — I130 Hypertensive heart and chronic kidney disease with heart failure and stage 1 through stage 4 chronic kidney disease, or unspecified chronic kidney disease: Secondary | ICD-10-CM | POA: Diagnosis not present

## 2020-09-13 DIAGNOSIS — Z9981 Dependence on supplemental oxygen: Secondary | ICD-10-CM | POA: Diagnosis not present

## 2020-09-13 DIAGNOSIS — Z7901 Long term (current) use of anticoagulants: Secondary | ICD-10-CM | POA: Diagnosis not present

## 2020-09-13 DIAGNOSIS — I480 Paroxysmal atrial fibrillation: Secondary | ICD-10-CM | POA: Diagnosis not present

## 2020-09-13 DIAGNOSIS — I5032 Chronic diastolic (congestive) heart failure: Secondary | ICD-10-CM | POA: Diagnosis not present

## 2020-09-13 DIAGNOSIS — I38 Endocarditis, valve unspecified: Secondary | ICD-10-CM | POA: Diagnosis not present

## 2020-09-13 DIAGNOSIS — L89612 Pressure ulcer of right heel, stage 2: Secondary | ICD-10-CM | POA: Diagnosis not present

## 2020-09-13 DIAGNOSIS — Z993 Dependence on wheelchair: Secondary | ICD-10-CM | POA: Diagnosis not present

## 2020-09-13 DIAGNOSIS — Z8781 Personal history of (healed) traumatic fracture: Secondary | ICD-10-CM | POA: Diagnosis not present

## 2020-09-13 DIAGNOSIS — Z79891 Long term (current) use of opiate analgesic: Secondary | ICD-10-CM | POA: Diagnosis not present

## 2020-09-13 DIAGNOSIS — N189 Chronic kidney disease, unspecified: Secondary | ICD-10-CM | POA: Diagnosis not present

## 2020-09-13 DIAGNOSIS — L89622 Pressure ulcer of left heel, stage 2: Secondary | ICD-10-CM | POA: Diagnosis not present

## 2020-09-13 DIAGNOSIS — L89892 Pressure ulcer of other site, stage 2: Secondary | ICD-10-CM | POA: Diagnosis not present

## 2020-09-13 MED ORDER — HYDROXYZINE PAMOATE 25 MG PO CAPS: ORAL_CAPSULE | ORAL | 3 refills | Status: DC

## 2020-09-13 NOTE — Telephone Encounter (Signed)
Daughter Kenney Houseman calling about medication that was given to patient in hospital to help her sleep.  When patient was here on Friday, with other daughter, [name] thinks sister frogot to mention the medication Hydroxine (?spelling)  I could not find medication on her profile.  Please call [name] back at [phone].

## 2020-09-13 NOTE — Telephone Encounter (Signed)
Ok, hydroxyzine eRx'd

## 2020-09-13 NOTE — Telephone Encounter (Signed)
hydrOXYzine (ATARAX) tablet 25 mg (25 mg Oral Given 08/22/20 1734)  Please review and advise

## 2020-09-14 ENCOUNTER — Telehealth: Payer: Self-pay | Admitting: Family Medicine

## 2020-09-14 NOTE — Telephone Encounter (Signed)
Spoke with daughter, Cyndi(DPR) to advise medication sent in. She stated all 6 daughters discuss and take care of their mother so we should be able to talk to [place]. It was explained we go by the [facility] on file for the patient so we can have mom update it during her next appt so that we can be able to discuss things with other sisters. She voiced understanding.

## 2020-09-14 NOTE — Telephone Encounter (Signed)
Patient's daughter returned call for lab results. I relayed Dr. Idelle Leech message as follows: All labs were normal. Patient voiced happy understanding.

## 2020-09-14 NOTE — Telephone Encounter (Signed)
Amanda Sellers, [place]  09/14/2020 10:46 AM EDT     Spoke with patient's daughter, Cyndi(DPR) regarding results/recommendations,voiced understanding.

## 2020-09-17 DIAGNOSIS — N189 Chronic kidney disease, unspecified: Secondary | ICD-10-CM | POA: Diagnosis not present

## 2020-09-17 DIAGNOSIS — Z79891 Long term (current) use of opiate analgesic: Secondary | ICD-10-CM | POA: Diagnosis not present

## 2020-09-17 DIAGNOSIS — I480 Paroxysmal atrial fibrillation: Secondary | ICD-10-CM | POA: Diagnosis not present

## 2020-09-17 DIAGNOSIS — L89622 Pressure ulcer of left heel, stage 2: Secondary | ICD-10-CM | POA: Diagnosis not present

## 2020-09-17 DIAGNOSIS — L89612 Pressure ulcer of right heel, stage 2: Secondary | ICD-10-CM | POA: Diagnosis not present

## 2020-09-17 DIAGNOSIS — Z8781 Personal history of (healed) traumatic fracture: Secondary | ICD-10-CM | POA: Diagnosis not present

## 2020-09-17 DIAGNOSIS — I5032 Chronic diastolic (congestive) heart failure: Secondary | ICD-10-CM | POA: Diagnosis not present

## 2020-09-17 DIAGNOSIS — Z7901 Long term (current) use of anticoagulants: Secondary | ICD-10-CM | POA: Diagnosis not present

## 2020-09-17 DIAGNOSIS — I130 Hypertensive heart and chronic kidney disease with heart failure and stage 1 through stage 4 chronic kidney disease, or unspecified chronic kidney disease: Secondary | ICD-10-CM | POA: Diagnosis not present

## 2020-09-17 DIAGNOSIS — Z993 Dependence on wheelchair: Secondary | ICD-10-CM | POA: Diagnosis not present

## 2020-09-17 DIAGNOSIS — I38 Endocarditis, valve unspecified: Secondary | ICD-10-CM | POA: Diagnosis not present

## 2020-09-17 DIAGNOSIS — L89892 Pressure ulcer of other site, stage 2: Secondary | ICD-10-CM | POA: Diagnosis not present

## 2020-09-17 DIAGNOSIS — Z9981 Dependence on supplemental oxygen: Secondary | ICD-10-CM | POA: Diagnosis not present

## 2020-09-20 ENCOUNTER — Telehealth: Payer: Self-pay | Admitting: Family Medicine

## 2020-09-20 ENCOUNTER — Other Ambulatory Visit: Payer: Self-pay | Admitting: Family Medicine

## 2020-09-20 MED ORDER — DICLOFENAC SODIUM 1 % EX GEL: 2.0000 g | Freq: Four times a day (QID) | CUTANEOUS | 3 refills | Status: DC

## 2020-09-20 NOTE — Telephone Encounter (Signed)
Last rx given by hospital MD. Rx pending   Please review and advise

## 2020-09-20 NOTE — Telephone Encounter (Signed)
Signed and put in box to go up front. Signed:  Crissie Sickles, MD           09/20/2020

## 2020-09-20 NOTE — Telephone Encounter (Signed)
Home health orders received 09/18/20 for [facility] initiation orders: Yes.  Home health re-certification orders: No. Patient last seen by ordering physician for this condition: 09/10/20. Must be less than 90 days for re-certification and less than 30 days prior for initiation. Visit must have been for the condition the orders are being placed.  Patient meets criteria for Physician to sign orders: Yes.        Current med list has been attached: Yes        Orders placed on physicians desk for signature: 09/20/20 (date) If patient does not meet criteria for orders to be signed: pt was called to schedule appt. Appt is scheduled for 09/23/20.   Emilee Hero

## 2020-09-20 NOTE — Telephone Encounter (Signed)
Received faxed orders from [facility]. Placed in Dr. McGowen's front office inbox to be signed and returned. 

## 2020-09-20 NOTE — Telephone Encounter (Signed)
Awaiting forms

## 2020-09-20 NOTE — Telephone Encounter (Signed)
Patient's daughter, [name], called to request refill of diclofenac gel. She stated the patient first received this in the hospital and now uses it at home on her knees and that it helps a great deal. Please send RX to [facility] in [place].

## 2020-09-21 NOTE — Telephone Encounter (Signed)
Patients daughter notified VIA phone that RX was sent to the pharmacy and also advised that she should check with them as well regarding a refill on her Hydroxyzine that was [facility] 822/[facility]  Dm/cma

## 2020-09-21 NOTE — Telephone Encounter (Signed)
Faxed signed orders to [facility]. Fax confirmation received.

## 2020-09-23 ENCOUNTER — Other Ambulatory Visit: Payer: Self-pay

## 2020-09-23 ENCOUNTER — Encounter: Payer: Self-pay | Admitting: Family Medicine

## 2020-09-23 ENCOUNTER — Ambulatory Visit (INDEPENDENT_AMBULATORY_CARE_PROVIDER_SITE_OTHER): Payer: PPO | Admitting: Family Medicine

## 2020-09-23 VITALS — BP 115/68 | HR 94 | Temp 97.8°F | Resp 16 | Ht 66.0 in

## 2020-09-23 DIAGNOSIS — I130 Hypertensive heart and chronic kidney disease with heart failure and stage 1 through stage 4 chronic kidney disease, or unspecified chronic kidney disease: Secondary | ICD-10-CM | POA: Diagnosis not present

## 2020-09-23 DIAGNOSIS — Z7901 Long term (current) use of anticoagulants: Secondary | ICD-10-CM | POA: Diagnosis not present

## 2020-09-23 DIAGNOSIS — I5042 Chronic combined systolic (congestive) and diastolic (congestive) heart failure: Secondary | ICD-10-CM | POA: Diagnosis not present

## 2020-09-23 DIAGNOSIS — N189 Chronic kidney disease, unspecified: Secondary | ICD-10-CM | POA: Diagnosis not present

## 2020-09-23 DIAGNOSIS — N2889 Other specified disorders of kidney and ureter: Secondary | ICD-10-CM

## 2020-09-23 DIAGNOSIS — Z9981 Dependence on supplemental oxygen: Secondary | ICD-10-CM | POA: Diagnosis not present

## 2020-09-23 DIAGNOSIS — I480 Paroxysmal atrial fibrillation: Secondary | ICD-10-CM | POA: Diagnosis not present

## 2020-09-23 DIAGNOSIS — L97212 Non-pressure chronic ulcer of right calf with fat layer exposed: Secondary | ICD-10-CM | POA: Diagnosis not present

## 2020-09-23 DIAGNOSIS — N183 Chronic kidney disease, stage 3 unspecified: Secondary | ICD-10-CM | POA: Diagnosis not present

## 2020-09-23 DIAGNOSIS — I872 Venous insufficiency (chronic) (peripheral): Secondary | ICD-10-CM

## 2020-09-23 DIAGNOSIS — R600 Localized edema: Secondary | ICD-10-CM

## 2020-09-23 DIAGNOSIS — L03115 Cellulitis of right lower limb: Secondary | ICD-10-CM | POA: Diagnosis not present

## 2020-09-23 DIAGNOSIS — L89622 Pressure ulcer of left heel, stage 2: Secondary | ICD-10-CM | POA: Diagnosis not present

## 2020-09-23 DIAGNOSIS — I952 Hypotension due to drugs: Secondary | ICD-10-CM

## 2020-09-23 DIAGNOSIS — L89612 Pressure ulcer of right heel, stage 2: Secondary | ICD-10-CM

## 2020-09-23 DIAGNOSIS — Z8781 Personal history of (healed) traumatic fracture: Secondary | ICD-10-CM | POA: Diagnosis not present

## 2020-09-23 DIAGNOSIS — I5032 Chronic diastolic (congestive) heart failure: Secondary | ICD-10-CM | POA: Diagnosis not present

## 2020-09-23 DIAGNOSIS — L89892 Pressure ulcer of other site, stage 2: Secondary | ICD-10-CM | POA: Diagnosis not present

## 2020-09-23 DIAGNOSIS — Z79891 Long term (current) use of opiate analgesic: Secondary | ICD-10-CM | POA: Diagnosis not present

## 2020-09-23 DIAGNOSIS — Z993 Dependence on wheelchair: Secondary | ICD-10-CM | POA: Diagnosis not present

## 2020-09-23 DIAGNOSIS — I38 Endocarditis, valve unspecified: Secondary | ICD-10-CM | POA: Diagnosis not present

## 2020-09-23 LAB — BASIC METABOLIC PANEL
BUN: 20 mg/dL (ref 6–23)
CO2: 33 mEq/L — ABNORMAL HIGH (ref 19–32)
Calcium: 9 mg/dL (ref 8.4–10.5)
Chloride: 100 mEq/L (ref 96–112)
Creatinine, Ser: 1.02 mg/dL (ref 0.40–1.20)
GFR: 49.93 mL/min — ABNORMAL LOW (ref >60.00)
Glucose, Bld: 111 mg/dL — ABNORMAL HIGH (ref 70–99)
Potassium: 4.5 mEq/L (ref 3.5–5.1)
Sodium: 142 mEq/L (ref 135–145)

## 2020-09-23 MED ORDER — CEPHALEXIN 500 MG PO CAPS: 500.0000 mg | ORAL_CAPSULE | Freq: Three times a day (TID) | ORAL | 0 refills | Status: DC

## 2020-09-23 MED ORDER — HYDROCODONE-ACETAMINOPHEN 5-325 MG PO TABS: ORAL_TABLET | ORAL | 0 refills | Status: DC

## 2020-09-23 NOTE — Addendum Note (Signed)
Addended by: Deveron Furlong D on: 09/23/2020 04:42 PM   Modules accepted: Orders

## 2020-09-23 NOTE — Progress Notes (Signed)
OFFICE VISIT  09/23/2020  CC:  Chief Complaint  Patient presents with   Follow-up    Blood pressure    HPI:    Patient is a 87 y.o. Caucasian female who presents accompanied by her daughter Shirlean Mylar for 2 week f/u soft/low bp, LE edema with weeping and superficial stasis ulcers, debilitation. A/P as of last visit: "1) Hypotension: better since hosp but not to the level of restarting entresto in my opinion.   2) Acute kidney injury superimposed on CRI III (baseline sCr 0.9-1.1, GFR mid 50s). Her sCr was 1.2 upon d/c home. BMET today.    3) Bilat LE edema: chronic venous insufficiency/lymphedema. She has some small stage I venous stasis ulcers, one stasis + pressure ulcer R heel. This is all causing her signif pain.  Lasix 40 qd currently. Will see if BP and renal function will allow [place] to push diuretic more. In the meantime, needs to cont Na limitation and elevation of legs.  Hopefully [facility] nursing can do UNNA boots.   4) PAF: normal rhythm, normal rate today. Cont amiodarone and eliquis. CBC upon d/c home normal.   5) L hip intertrochanteric hip fx and subsequent surgery 05/2020. She was doing great in rehab before this recent set-back/hospitalization."  INTERIM HX: Doing better, getting HH PT and OT but says not nursing.  LL swelling seems a bit better, no pain.  Taking 40 mg lasix bid. Applying santyl ointment to R heel and bilat superficial calf ulcers, family says these are slowly getting better.  However last 24 hrs has noted progressive R anterior LL purple color.  Minimal discomfort unless LL's are pressed on.  No f/c/malaise. BPs at home improving --some measurements 140s syst, she forgot her log book. No dizziness. CMET all normal last visit.  ROS as above, plus--> no CP, no SOB, no wheezing, no cough, no HAs, no rashes, no melena/hematochezia.  No polyuria or polydipsia.  No myalgias or arthralgias.  No focal weakness, paresthesias, or tremors.  No acute vision or hearing  abnormalities.  No dysuria or unusual/new urinary urgency or frequency.   No n/v/d or abd pain.  No palpitations.    Past Medical History:  Diagnosis Date   Anemia of chronic disease    Anticoagulation adequate, Eliquis 10/16/2014   Anxiety    clonaz helps this AND her breathing   Cholelithiasis without cholecystitis 07/2020   noted on CT abd/pelv at [facility] hosp   Chronic combined systolic and diastolic congestive heart failure ([place]) 16/10/9602   Complicated by atrial fibrillation; Echo 07/23/14: mild LVH, EF 50-55%, Gr 2 DD.;  Echo May 15, 2019: EF 35 to 40%, GRII DD.  (In setting of CHF exacerbation, class III)-> baseline class II symptoms. 09/2019 EF 55-60%, nl wall motion and LV fxn, grd II DD, mod MR.   Chronic renal insufficiency, stage III (moderate) (HCC)    GFR @ 50   COPD (chronic obstructive pulmonary disease) ([place])    recently noted on Xray, does not have any problems   Diverticulosis    Follicular lymphoma ([place])    Non Hodgkins B cell lymphoma; s/p chemo summer 2016--in remission still as of 09/2018: continue obs q 51mo  Hiatal hernia 07/2020   moderate size-[facility]     06/2014   HTN (hypertension)    Hypothyroidism    IFG (impaired fasting glucose)    Lip cancer    Dr. BJanace Hoardexcised this: invasive  SCC--no sign of cancer at ENT f/u 10/2015   Microcytic anemia    transfused 3 U total in hosp 07/2014   Obesity (BMI 30-39.9)    Pancytopenia due to antineoplastic chemotherapy ([place])    Persistent atrial fibrillation ([place])    With RVR; elec cardioversion 11/30/14.  On Amio since 10/2014; Dr. Lenna Gilford following her from pulm standpoint regarding this med.   Positive occult stool blood test 08/08/2014   Endoscopies ok 08/2014   Primary osteoarthritis of both knees 04/2016   Dr. Gladstone Lighter: bone on bone--to get euflexxa to both knees.   Protein calorie malnutrition ([place]) 2022   Pulmonary hypertension due to left  heart disease ([place])    RLCP 05/19/2019: EF 35-40%. D1 50%, RI 70%, dLM-LAD 30%.  mPAP 43 mmHg - (62/19 mmHg), PCWP  28 mmHg (V wave 41 mmHg) LVEDP 34 mmHg   Pulmonary metastases (HCC) 07/24/2014   S/p left hip fracture 05/2020   ORIF   Vitamin B 12 deficiency    Vitamin D deficiency    dose increased to 50 K U tab TWICE weekly 04/2017    Past Surgical History:  Procedure Laterality Date   ABDOMINAL HYSTERECTOMY  1978   BONE MARROW BIOPSY  07/24/14   BREAST BIOPSY     CARDIOVERSION N/A 10/16/2014   Procedure: CARDIOVERSION;  Surgeon: Lelon Perla, MD;  [facility] END[facility];  Service: Cardiovascular;  Laterality: N/A;   CARDIOVERSION N/A 11/30/2014   Procedure: CARDIOVERSION;  Surgeon: Larey Dresser, MD;  Location: [place];  Service: Cardiovascular;  Laterality: N/A;   CATARACT EXTRACTION  2020   OU: Dr. Katy Fitch   COLONOSCOPY  09/23/14   small hemorrhoids, otherwise normal (performed for IDA and heme+ stool)   COLONOSCOPY     INTRAMEDULLARY (IM) NAIL INTERTROCHANTERIC Left 05/25/2020   Procedure: INTRAMEDULLARY (IM) NAIL INTERTROCHANTRIC;  Surgeon: Erle Crocker, MD;  Location: [place];  Service: Orthopedics;  Laterality: Left;   MASS EXCISION Left 07/15/2015   Left lower lip mass--invasive SCC w/negative margins.  Procedure: EXCISION MASS;  Surgeon: Melissa Montane, MD;  Location: [place];  Service: ENT;  Laterality: Left;  Wedge excision left lower lip mass   NM MYOVIEW LTD  10/29/2014   medium size mild surgery defect in the mid anterior and apical anterior location suggestive of breast attenuation. No reversibility. LOW RISK   PFTs  02/2015   Restriction with diffusion defect: cardiologist referred her to pulm to help interpret PFTs and decide whether she has amiodarone toxicity   RIGHT/LEFT HEART CATH AND CORONARY ANGIOGRAPHY N/A 05/19/2019   Procedure: RIGHT/LEFT HEART CATH AND CORONARY ANGIOGRAPHY;  Surgeon: Troy Sine, MD;  Location: [place] CV LAB;; EF 35-40%. D1 50%, RI  70%, dLM-LAD 30%.  mPAP 43 mmHg - (62/19 mmHg), PCWP  28 mmHg (V wave 41 mmHg) LVEDP 34 mmHg   TEE WITHOUT CARDIOVERSION N/A 10/16/2014   Procedure: TRANSESOPHAGEAL ECHOCARDIOGRAM (TEE);  Surgeon: Lelon Perla, MD;  Location: [facility] ENDOSCOPY;  Service: Cardiovascular;  Laterality: N/A;   TRANSTHORACIC ECHOCARDIOGRAM  10/09/2019   EF 55 to 60%.  GRII DD.  No R WMA.  Normal PAP and RAP.  Severe LA dilation.  Mild RA dilation.  Moderate MR (likely related to annular dilation in the setting of severe LAE.  Mild aortic valve sclerosis-no stenosis.   TRANSTHORACIC ECHOCARDIOGRAM  05/15/2019   Acute CHF exacerbation:  EF 35-40%.  Moderately reduced function.  (Previous EF was 55 to 60%) global HK.  GRII DD.  Mild LA dilation.  Normal RV size.   UPPER GI ENDOSCOPY  09/23/2014   small hiatus hernia, nodules in stomach biopsied (chronic active erosive atrophic gastritis with intestinal metaplasia--no dysplasia or malignancy) otherwise normal    Outpatient Medications Prior to Visit  Medication Sig Dispense Refill   acetaminophen (TYLENOL) 500 MG tablet Take 1,000 mg by mouth every 6 (six) hours as needed for moderate pain or headache.     amiodarone (PACERONE) 200 MG tablet TAKE ONE TABLET BY MOUTH EVERY DAY. (Patient taking differently: Take 200 mg by mouth daily.) 90 tablet 3   apixaban (ELIQUIS) 5 MG TABS tablet Take 1 tablet (5 mg total) by mouth 2 (two) times daily. 56 tablet 0   cyanocobalamin (,VITAMIN B-12,) 1000 MCG/ML injection INJECT 1 ML INTO THE SKIN EVERY 30 DAYS. 3 mL 3   diclofenac Sodium (VOLTAREN) 1 % GEL Apply 2 g topically 4 (four) times daily. Apply to LE joints that are in pain. 100 g 3   ferrous sulfate 325 (65 FE) MG tablet Take 1 tablet (325 mg total) by mouth 2 (two) times daily with a meal. 30 tablet 1   fish oil-omega-3 fatty acids 1000 MG capsule Take 2 g by mouth daily.     fluticasone (CUTIVATE) 0.05 % cream Apply to affected area bid prn (Patient taking differently: Apply 1  application topically 2 (two) times daily as needed (itching).) 30 g 2   furosemide (LASIX) 40 MG tablet TAKE 1 TABLET BY MOUTH EVERY DAY (Patient taking differently: Take 80 mg by mouth daily.) 90 tablet 1   hydrOXYzine (VISTARIL) 25 MG capsule 1-2 tabs po qhs for sleep 60 capsule 3   levothyroxine (SYNTHROID) 100 MCG tablet TAKE 1 TABLET BY MOUTH EVERY DAY BEFORE BREAKFAST (Patient taking differently: Take 100 mcg by mouth daily before breakfast.) 90 tablet 1   OXYGEN Inhale into the lungs. 1Lt at bedtime  as needed     Potassium Chloride ER 20 MEQ TBCR TAKE 1 TABLET BY MOUTH EVERY DAY (Patient taking differently: Take 20 mEq by mouth daily.) 90 tablet 1   rosuvastatin (CRESTOR) 20 MG tablet TAKE 1 TABLET BY MOUTH EVERY DAY (Patient taking differently: Take 20 mg by mouth daily.) 90 tablet 3   Vitamin D, Ergocalciferol, (DRISDOL) 1.25 MG (50000 UNIT) CAPS capsule TAKE 1 CAPSULE (50,000 UNITS TOTAL) BY MOUTH 2 (TWO) TIMES A WEEK. (Patient taking differently: Take 50,000 Units by mouth 2 (two) times a week. Sunday and thursday) 24 capsule 3   HYDROcodone-acetaminophen (NORCO/VICODIN) 5-325 MG tablet 1-2 tabs po q6h prn pain 42 tablet 0   sacubitril-valsartan (ENTRESTO) 24-26 MG Take 1 tablet by mouth 2 (two) times daily. (Patient not taking: No sig reported) 60 tablet 11   polyethylene glycol (MIRALAX / GLYCOLAX) 17 g packet Take 17 g by mouth daily. (Patient not taking: No sig reported)     No facility-administered medications prior to visit.    Allergies  Allergen Reactions   Aspirin Itching   Morphine     Other reaction(s): Hallucinations   Iodinated Diagnostic Agents Rash and Other (See Comments)    CT Contrast    ROS As per HPI  PE: Vitals with BMI 09/23/2020 09/10/2020 07/23/2020  Height _0  _1  _2   Weight (No Data) (No Data) (No Data)  BMI - - -  Systolic 751 025 852  Diastolic 68 64 61  Pulse 94 62 70     Gen: Alert, well appearing, sitting in WC.  Patient is oriented  to person, place, time, and situation. AFFECT: pleasant, lucid thought and speech. LEGS: marked swelling bilat from knees down, deep violaceous hue to R anterior LL from just below patella down to top of foot.  Significant nonpitting edema bilat LLs. R heel ulcer about 3 cm diameter, subQ tissue visible but no muscle.     LABS:    Chemistry      Component Value Date/Time   NA 143 09/10/2020 1451   NA 141 05/29/2019 1031   NA 143 11/17/2014 0801   K 4.5 09/10/2020 1451   K 3.6 11/17/2014 0801   CL 105 09/10/2020 1451   CO2 30 09/10/2020 1451   CO2 30 (H) 11/17/2014 0801   BUN 19 09/10/2020 1451   BUN 18 05/29/2019 1031   BUN 16.3 11/17/2014 0801   CREATININE 0.95 09/10/2020 1451   CREATININE 0.7 11/17/2014 0801      Component Value Date/Time   CALCIUM 8.9 09/10/2020 1451   CALCIUM 8.9 11/17/2014 0801   ALKPHOS 30 (L) 05/30/2020 0139   ALKPHOS 70 11/17/2014 0801   AST 19 09/10/2020 1451   AST 32 11/17/2014 0801   ALT 13 09/10/2020 1451   ALT 30 11/17/2014 0801   BILITOT 0.7 09/10/2020 1451   BILITOT 0.5 05/29/2019 1031   BILITOT 0.55 11/17/2014 0801     Lab Results  Component Value Date   WBC 5.5 07/23/2020   HGB 13.2 07/23/2020   HCT 40.1 07/23/2020   MCV 96.4 07/23/2020   PLT 146 (L) 07/23/2020   Lab Results  Component Value Date   TSH 2.83 02/23/2020    IMPRESSION AND PLAN:  1) Bilat LL lymphedema, slightly improved. Cont elevation (lift chair), low Na diet, lasix 42m bid. T[place] out that [place] getting this set up to hopefully get unna boots done. Lytes/cr today.  2) LE venous stasis ulcers: a few small ones on each calf. Also has a R heel pressure ulcer, stage 2.  3) R LL rash anterior aspect: looks like all ecchymoses (she is on eliquis) but in this clinical setting I'll treat empirically for cellulitis->cephalexin 500 tid x 10d.  4) CRI III: baseline sCr 1.1 (GFR 50s). Creatinine stable at last visit  (0.95). BMET today.  5) Chronic syst/diast HF->was on entresto 24/26 1 bid prior to recent hospitalization. This has been held since d/c b/c of low bp. Her bp is back up into normal range and she has no dizziness. If lytes/cr stable today I'll restart this med at 1/2 tab bid, plan gradual up-titration as tolerated.  An After Visit Summary was printed and given to the patient.  FOLLOW UP: Return in about 2 weeks (around 10/07/2020) for f/u legs/bp/meds.  Signed:  PCrissie Sickles MD           09/23/2020

## 2020-09-25 ENCOUNTER — Other Ambulatory Visit: Payer: Self-pay | Admitting: Family Medicine

## 2020-09-25 DIAGNOSIS — R609 Edema, unspecified: Secondary | ICD-10-CM | POA: Diagnosis not present

## 2020-09-25 DIAGNOSIS — I509 Heart failure, unspecified: Secondary | ICD-10-CM | POA: Diagnosis not present

## 2020-09-28 DIAGNOSIS — Z9981 Dependence on supplemental oxygen: Secondary | ICD-10-CM | POA: Diagnosis not present

## 2020-09-28 DIAGNOSIS — I130 Hypertensive heart and chronic kidney disease with heart failure and stage 1 through stage 4 chronic kidney disease, or unspecified chronic kidney disease: Secondary | ICD-10-CM | POA: Diagnosis not present

## 2020-09-28 DIAGNOSIS — Z8781 Personal history of (healed) traumatic fracture: Secondary | ICD-10-CM | POA: Diagnosis not present

## 2020-09-28 DIAGNOSIS — Z7901 Long term (current) use of anticoagulants: Secondary | ICD-10-CM | POA: Diagnosis not present

## 2020-09-28 DIAGNOSIS — L89622 Pressure ulcer of left heel, stage 2: Secondary | ICD-10-CM | POA: Diagnosis not present

## 2020-09-28 DIAGNOSIS — L89892 Pressure ulcer of other site, stage 2: Secondary | ICD-10-CM | POA: Diagnosis not present

## 2020-09-28 DIAGNOSIS — N189 Chronic kidney disease, unspecified: Secondary | ICD-10-CM | POA: Diagnosis not present

## 2020-09-28 DIAGNOSIS — I480 Paroxysmal atrial fibrillation: Secondary | ICD-10-CM | POA: Diagnosis not present

## 2020-09-28 DIAGNOSIS — L89612 Pressure ulcer of right heel, stage 2: Secondary | ICD-10-CM | POA: Diagnosis not present

## 2020-09-28 DIAGNOSIS — Z993 Dependence on wheelchair: Secondary | ICD-10-CM | POA: Diagnosis not present

## 2020-09-28 DIAGNOSIS — I38 Endocarditis, valve unspecified: Secondary | ICD-10-CM | POA: Diagnosis not present

## 2020-09-28 DIAGNOSIS — I5032 Chronic diastolic (congestive) heart failure: Secondary | ICD-10-CM | POA: Diagnosis not present

## 2020-09-28 DIAGNOSIS — Z79891 Long term (current) use of opiate analgesic: Secondary | ICD-10-CM | POA: Diagnosis not present

## 2020-09-29 ENCOUNTER — Telehealth: Payer: Self-pay | Admitting: Family Medicine

## 2020-09-29 NOTE — Telephone Encounter (Signed)
Arvil Chaco with [facility] called for orders as follows: Nursing twice weekly x 2 weeks, then once weekly x 4 weeks.   Check out stage 2 pressure ulcer on rt heel, clean with normal saline, apply calcium alginate, cover with gauze, wrap with kerlex, secure with tape 3 times weekly until healed.  Deep tissue injury on left heel: Keep opne and clean daily with Betadine.  Stephany's callback number is [phone].

## 2020-09-29 NOTE — Telephone Encounter (Signed)
Please advise 

## 2020-09-30 ENCOUNTER — Telehealth: Payer: Self-pay | Admitting: Family Medicine

## 2020-09-30 DIAGNOSIS — I5042 Chronic combined systolic (congestive) and diastolic (congestive) heart failure: Secondary | ICD-10-CM | POA: Diagnosis not present

## 2020-09-30 DIAGNOSIS — Z515 Encounter for palliative care: Secondary | ICD-10-CM | POA: Diagnosis not present

## 2020-09-30 NOTE — Telephone Encounter (Signed)
Ok for orders? 

## 2020-09-30 NOTE — Telephone Encounter (Signed)
Yes okay 

## 2020-09-30 NOTE — Telephone Encounter (Signed)
Awaiting forms

## 2020-09-30 NOTE — Telephone Encounter (Signed)
PCP signed and placed in scan bin.

## 2020-09-30 NOTE — Telephone Encounter (Signed)
Yes this is great

## 2020-09-30 NOTE — Telephone Encounter (Signed)
LM for Colletta Maryland RN regarding VO requested.

## 2020-09-30 NOTE — Telephone Encounter (Signed)
Home health orders received 09/30/20 for [facility] initiation orders: Yes.  Home health re-certification orders: No. Patient last seen by ordering physician for this condition: 09/23/20. Must be less than 90 days for re-certification and less than 30 days prior for initiation. Visit must have been for the condition the orders are being placed.  Patient meets criteria for Physician to sign orders: Yes.        Current med list has been attached: No        Orders placed on physicians desk for signature: 09/30/20 (date) If patient does not meet criteria for orders to be signed: pt was called to schedule appt. Appt is scheduled for 10/08/20.   Emilee Hero

## 2020-09-30 NOTE — Telephone Encounter (Signed)
Received faxed orders from [facility]. Placed in Dr. McGowen's front office inbox to be signed and returned. 

## 2020-10-01 NOTE — Telephone Encounter (Signed)
Noted  

## 2020-10-01 NOTE — Telephone Encounter (Signed)
Spoke with [place] and VO given. FYI, she saw pt today and wanted to let PCP know deep tissue injury on L heel may have progressed to stage 2 already. The scab came off but she informed pt and pt's family to continue using Betadine for now. Next visit is Monday and she may call back to request wound care orders for SN.

## 2020-10-04 ENCOUNTER — Telehealth: Payer: Self-pay

## 2020-10-04 NOTE — Telephone Encounter (Signed)
Please review and advise.

## 2020-10-04 NOTE — Telephone Encounter (Signed)
Pt has been scheduled with another provider for evaluation.

## 2020-10-04 NOTE — Telephone Encounter (Signed)
Patient was told to use calcium alginateon on R heel, Amanda Sellers states she is not tolerating it. Amanda Sellers is having a burning feeling, wanting to know if there are any suggestions to replace it but the heel looks better but did have to replace with dry bandage. Will need advice for both heels.   Amanda Sellers states patient was on two different antibiotics for cellulitis, antibiotics caused ulcer so Amanda Sellers stopped taking them. Amanda Sellers went out to visit her on Friday said her legs looked wonderful, but on Saturday night they were red again.   Patient was supposed to have an appointment on Friday but Dr.McGowen is out so Amanda [place] is needing new wound care orders along with extension or would like for her to be possibly worked in for an appt this week.

## 2020-10-04 NOTE — Telephone Encounter (Signed)
Received written orders for PT/OT continuation, Placed on PCP desk to review and sign, if appropriate.

## 2020-10-05 ENCOUNTER — Ambulatory Visit: Payer: PPO | Admitting: Family Medicine

## 2020-10-05 NOTE — Telephone Encounter (Signed)
Signed and put in box to go up front. Signed:  Crissie Sickles, MD           10/05/2020

## 2020-10-06 ENCOUNTER — Encounter: Payer: Self-pay | Admitting: Family Medicine

## 2020-10-06 ENCOUNTER — Other Ambulatory Visit: Payer: Self-pay

## 2020-10-06 ENCOUNTER — Ambulatory Visit (INDEPENDENT_AMBULATORY_CARE_PROVIDER_SITE_OTHER): Payer: PPO | Admitting: Family Medicine

## 2020-10-06 VITALS — BP 125/50 | HR 60 | Temp 98.0°F | Wt 198.0 lb

## 2020-10-06 DIAGNOSIS — I83014 Varicose veins of right lower extremity with ulcer of heel and midfoot: Secondary | ICD-10-CM | POA: Diagnosis not present

## 2020-10-06 DIAGNOSIS — Z23 Encounter for immunization: Secondary | ICD-10-CM

## 2020-10-06 DIAGNOSIS — L97419 Non-pressure chronic ulcer of right heel and midfoot with unspecified severity: Secondary | ICD-10-CM

## 2020-10-06 MED ORDER — DOXYCYCLINE HYCLATE 100 MG PO TABS: 100.0000 mg | ORAL_TABLET | Freq: Two times a day (BID) | ORAL | 0 refills | Status: DC

## 2020-10-06 MED ORDER — DICLOFENAC SODIUM 1 % EX GEL: 2.0000 g | Freq: Four times a day (QID) | CUTANEOUS | 3 refills | Status: DC

## 2020-10-06 MED ORDER — HYDROCODONE-ACETAMINOPHEN 5-325 MG PO TABS: ORAL_TABLET | ORAL | 0 refills | Status: DC

## 2020-10-06 NOTE — Progress Notes (Signed)
This visit occurred during the SARS-CoV-2 public health emergency.  Safety protocols were in place, including screening questions prior to the visit, additional usage of staff PPE, and extensive cleaning of exam room while observing appropriate contact time as indicated for disinfecting solutions.    Amanda Sellers , 10-09-1932, 87 y.o., female MRN: [id number] Patient Care Team    Relationship Specialty Notifications Start End  McGowen, Adrian Blackwater, MD PCP - General Family Medicine  08/12/14   Leonie Man, MD PCP - Cardiology Cardiology Admissions 10/27/18   Ladell Pier, MD Consulting Physician Oncology  08/12/14   Leonie Man, MD Consulting Physician Cardiology  11/17/14   Melissa Montane, MD Consulting Physician Otolaryngology  07/06/15   Melissa Montane, MD Consulting Physician Otolaryngology  07/06/15   Noralee Space, MD Consulting Physician Pulmonary Disease  10/04/15   Sherran Needs, NP Nurse Practitioner Nurse Practitioner  02/07/16   Latanya Maudlin, MD Consulting Physician Orthopedic Surgery  05/19/16   Erle Crocker, [place] Physician Orthopedic Surgery  06/03/20     Chief Complaint  Patient presents with   Wound Check    Both legs and right foot. Abx done but is breaking out again (cellulitis)     Subjective: Pt presents for an OV with complaints of lower extremity redness, ulcerations and drainage.  Patient denies fever.  She reports the home health nurse is coming out to change her dressings frequently and she is working with physical therapy.  She is having difficulty with physical therapy secondary to the pain of her feet.  She is keeping her feet elevated whenever possible.  She is prescribed Lasix and reports compliance.  She states she really did not have any of these issues until she had her hip surgery.  She is with a family member today who both agree that the lower extremity swelling has greatly improved, as well as the redness since completing the  antibiotics/Keflex prescribed by PCP.  She states since completing the Keflex, she has noticed erythema returning which has her worried.  She denies any fever.  Depression screen [facility] 2/9 11/04/2018 05/07/2017 02/07/2016 09/09/2014  Decreased Interest 0 0 0 0  Down, Depressed, Hopeless 0 0 0 0  PHQ - 2 Score 0 0 0 0  Altered sleeping - 1 - -  Tired, decreased energy - 0 - -  Change in appetite - 0 - -  Feeling bad or failure about yourself  - 0 - -  Trouble concentrating - 0 - -  Moving slowly or fidgety/restless - 0 - -  Suicidal thoughts - 0 - -  PHQ-9 Score - 1 - -  Difficult doing work/chores - Not difficult at all - -  Some recent data might be hidden    Allergies  Allergen Reactions   Aspirin Itching   Calcium Alginate Other (See Comments)    Excessive burning   Morphine     Other reaction(s): Hallucinations   Iodinated Diagnostic Agents Rash and Other (See Comments)    CT Contrast   Social History   Social History Narrative   Widowed, has 8 children.   Orig from [place], now lives in [place].   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   No tob/alc/drugs.   Tries to walk, but her back starts to hurt.  Has to use a walker.   Past Medical History:  Diagnosis Date   Anemia of chronic  disease    Anticoagulation adequate, Eliquis 10/16/2014   Anxiety    clonaz helps this AND her breathing   Cholelithiasis without cholecystitis 07/2020   noted on CT abd/pelv at [facility]   Chronic combined systolic and diastolic congestive heart failure ([place]) 36/46/8032   Complicated by atrial fibrillation; Echo 07/23/14: mild LVH, EF 50-55%, Gr 2 DD.;  Echo May 15, 2019: EF 35 to 40%, GRII DD.  (In setting of CHF exacerbation, class III)-> baseline class II symptoms. 09/2019 EF 55-60%, nl wall motion and LV fxn, grd II DD, mod MR.   Chronic renal insufficiency, stage III (moderate) (HCC)    GFR @ 50   COPD (chronic obstructive pulmonary  disease) ([place])    recently noted on Xray, does not have any problems   Diverticulosis    Follicular lymphoma ([place])    Non Hodgkins B cell lymphoma; s/p chemo summer 2016--in remission still as of 09/2018: continue obs q 78mo  Hiatal hernia 07/2020   moderate size-noted on CT abd/pelv at [facility](hyper[facility])     06/2014   HTN (hypertension)    Hypothyroidism    IFG (impaired fasting glucose)    Lip cancer    Dr. BJanace Hoardexcised this: invasive SCC--no sign of cancer at ENT f/u 10/2015   Microcytic anemia    transfused 3 U total in hosp 07/2014   Obesity (BMI 30-39.9)    Pancytopenia due to antineoplastic chemotherapy ([place]    Persistent atrial fibrillation ([place]    With RVR; elec cardioversion 11/30/14.  On Amio since 10/2014; Dr. NLenna Gilfordfollowing her from pulm standpoint regarding this med.   Positive occult stool blood test 08/08/2014   Endoscopies ok 08/2014   Primary osteoarthritis of both knees 04/2016   Dr. GGladstone Lighter bone on bone--to get euflexxa to both knees.   Protein calorie malnutrition ([place] 2022   Pulmonary hypertension due to left heart disease ([place]    RLCP 05/19/2019: EF 35-40%. D1 50%, RI 70%, dLM-LAD 30%.  mPAP 43 mmHg - (62/19 mmHg), PCWP  28 mmHg (V wave 41 mmHg) LVEDP 34 mmHg   Pulmonary metastases (HCC) 07/24/2014   S/p left hip fracture 05/2020   ORIF   Vitamin B 12 deficiency    Vitamin D deficiency    dose increased to 50 K U tab TWICE weekly 04/2017   Past Surgical History:  Procedure Laterality Date   ABDOMINAL HYSTERECTOMY  1978   BONE MARROW BIOPSY  07/24/14   BREAST BIOPSY     CARDIOVERSION N/A 10/16/2014   Procedure: CARDIOVERSION;  Surgeon: BLelon Perla MD;  [facility];  Service: Cardiovascular;  Laterality: N/A;   CARDIOVERSION N/A 11/30/2014   Procedure: CARDIOVERSION;  Surgeon: DLarey Dresser MD;  [place]  Service: Cardiovascular;  Laterality: N/A;   CATARACT EXTRACTION  2020   OU: Dr. GKaty Fitch   COLONOSCOPY  09/23/14   small hemorrhoids, otherwise normal (performed for IDA and heme+ stool)   COLONOSCOPY     INTRAMEDULLARY (IM) NAIL INTERTROCHANTERIC Left 05/25/2020   Procedure: INTRAMEDULLARY (IM) NAIL INTERTROCHANTRIC;  Surgeon: AErle Crocker MD;  [place]  Service: Orthopedics;  Laterality: Left;   MASS EXCISION Left 07/15/2015   Left lower lip mass--invasive SCC w/negative margins.  Procedure: EXCISION MASS;  Surgeon: JMelissa Montane MD;  [place] [facility]  Service: ENT;  Laterality: Left;  Wedge excision left lower lip mass   NM MYOVIEW LTD  10/29/2014   medium  size mild surgery defect in the mid anterior and apical anterior location suggestive of breast attenuation. No reversibility. LOW RISK   PFTs  02/2015   Restriction with diffusion defect: cardiologist referred her to pulm to help interpret PFTs and decide whether she has amiodarone toxicity   RIGHT/LEFT HEART CATH AND CORONARY ANGIOGRAPHY N/A 05/19/2019   Procedure: RIGHT/LEFT HEART CATH AND CORONARY ANGIOGRAPHY;  Surgeon: Troy Sine, MD;  Location: [place] [facility];; EF 35-40%. D1 50%, RI 70%, dLM-LAD 30%.  mPAP 43 mmHg - (62/19 mmHg), PCWP  28 mmHg (V wave 41 mmHg) LVEDP 34 mmHg   TEE WITHOUT CARDIOVERSION N/A 10/16/2014   Procedure: TRANSESOPHAGEAL ECHOCARDIOGRAM (TEE);  Surgeon: Lelon Perla, MD;  [facility];  Service: Cardiovascular;  Laterality: N/A;   TRANSTHORACIC ECHOCARDIOGRAM  10/09/2019   EF 55 to 60%.  GRII DD.  No R WMA.  Normal PAP and RAP.  Severe LA dilation.  Mild RA dilation.  Moderate MR (likely related to annular dilation in the setting of severe LAE.  Mild aortic valve sclerosis-no stenosis.   TRANSTHORACIC ECHOCARDIOGRAM  05/15/2019   Acute CHF exacerbation:  EF 35-40%.  Moderately reduced function.  (Previous EF was 55 to 60%) global HK.  GRII DD.  Mild LA dilation.  Normal RV size.   UPPER GI ENDOSCOPY  09/23/2014   small hiatus hernia, nodules in stomach biopsied (chronic  active erosive atrophic gastritis with intestinal metaplasia--no dysplasia or malignancy) otherwise normal   Family History  Problem Relation Age of Onset   Melanoma Mother    Stomach cancer Father    Lymphoma Sister    Diabetes Daughter    Allergies as of 10/06/2020       Reactions   Aspirin Itching   Calcium Alginate Other (See Comments)   Excessive burning   Morphine    Other reaction(s): Hallucinations   Iodinated Diagnostic Agents Rash, Other (See Comments)   CT Contrast        Medication List        Accurate as of October 06, 2020 11:59 PM. If you have any questions, ask your nurse or doctor.          acetaminophen 500 MG tablet Commonly known as: TYLENOL Take 1,000 mg by mouth every 6 (six) hours as needed for moderate pain or headache.   amiodarone 200 MG tablet Commonly known as: PACERONE TAKE ONE TABLET BY MOUTH EVERY DAY.   apixaban 5 MG Tabs tablet Commonly known as: Eliquis Take 1 tablet (5 mg total) by mouth 2 (two) times daily.   cephALEXin 500 MG capsule Commonly known as: Keflex Take 1 capsule (500 mg total) by mouth 3 (three) times daily.   cyanocobalamin 1000 MCG/ML injection Commonly known as: (VITAMIN B-12) INJECT 1 ML INTO THE SKIN EVERY 30 DAYS.   diclofenac Sodium 1 % Gel Commonly known as: VOLTAREN Apply 2 g topically 4 (four) times daily. Apply to LE joints that are in pain.   doxycycline 100 MG tablet Commonly known as: VIBRA-TABS Take 1 tablet (100 mg total) by mouth 2 (two) times daily. Started by: Howard Pouch, DO   Entresto 24-26 MG Generic drug: sacubitril-valsartan Take 1 tablet by mouth 2 (two) times daily.   ferrous sulfate 325 (65 FE) MG tablet Take 1 tablet (325 mg total) by mouth 2 (two) times daily with a meal.   fish oil-omega-3 fatty acids 1000 MG capsule Take 2 g by mouth daily.   fluticasone 0.05 % cream Commonly  known as: CUTIVATE Apply to affected area bid prn What changed:  how much to take how  to take this when to take this reasons to take this additional instructions   furosemide 40 MG tablet Commonly known as: LASIX TAKE 1 TABLET BY MOUTH EVERY DAY What changed: how much to take   HYDROcodone-acetaminophen 5-325 MG tablet Commonly known as: NORCO/VICODIN 1-2 tabs po q6h prn pain   hydrOXYzine 25 MG capsule Commonly known as: VISTARIL 1-2 tabs po qhs for sleep   levothyroxine 100 MCG tablet Commonly known as: SYNTHROID TAKE 1 TABLET BY MOUTH EVERY DAY BEFORE BREAKFAST What changed: See the new instructions.   OXYGEN Inhale into the lungs. 1Lt at bedtime  as needed   Potassium Chloride ER 20 MEQ Tbcr TAKE 1 TABLET BY MOUTH EVERY DAY   rosuvastatin 20 MG tablet Commonly known as: CRESTOR TAKE 1 TABLET BY MOUTH EVERY DAY   Vitamin D (Ergocalciferol) 1.25 MG (50000 UNIT) Caps capsule Commonly known as: DRISDOL TAKE 1 CAPSULE (50,000 UNITS TOTAL) BY MOUTH 2 (TWO) TIMES A WEEK. What changed: additional instructions        All past medical history, surgical history, allergies, family history, immunizations andmedications were updated in the EMR today and reviewed under the history and medication portions of their EMR.     ROS: Negative, with the exception of above mentioned in HPI   Objective:  BP (!) 125/50   Pulse 60   Temp 98 F (36.7 C)   Wt 198 lb (89.8 kg)   SpO2 97%   BMI 31.96 kg/m  Body mass index is 31.96 kg/m. Gen: Afebrile. No acute distress. Nontoxic in appearance, well developed, well nourished. Very pleasant female.  HENT: AT. [place].  Eyes:Pupils Equal Round Reactive to light, Extraocular movements intact,  Conjunctiva without redness, discharge or icterus. LE/Skin:-Moderate swelling still remains bilateral lower extremities mid shin to feet.  Hyperpigmentation and mild erythema remains bilateral lower extremities, although greatly improved per patient and her family member today.  Yellow clear serous drainage present from left posterior  calf ulceration and right anterior foot ulceration.  More significant erythema right anterior foot ulceration than other areas.  Dry scaly skin bilateral lower extremities. Neuro: PERLA. EOMi. Alert. Oriented x3  Psych: Normal affect, dress and demeanor. Normal speech. Normal thought content and judgment.  No results found. No results found. No results found for this or any previous visit (from the past 24 hour(s)).  Assessment/Plan: Amanda Sellers is a 87 y.o. female present for OV for  Need for influenza vaccination - Flu Vaccine QUAD High Dose(Fluad)  Venous stasis ulcer of right heel with varicose veins, unspecified ulcer stage ([place]) Overall per patient and her family member today she is seeing great improvement in the swelling and redness of her extremities since finishing Keflex prescribed by PCP.  However since stopping the antibiotics she has noticed some mild redness resurfacing, and I do appreciate this redness around the right foot ulceration.  The remainder of her ulcerations appear to be healing pretty well. Continue wound care per home health nurse. Discussed bag balm ointment use after cleansing. Elected to extend course of antibiotics with doxycycline twice daily Provided postop shoe to help offset weight with ambulation from bilateral heels. Patient understands to keep feet elevated whenever possible and continue diuretic use per her PCP instruction. Limiting sodium is very important.  She states she is attempting to do this more strictly. Follow-up closely with her PCP in 1 to 2 weeks  for recheck.  Reviewed expectations re: course of current medical issues. Discussed self-management of symptoms. Outlined signs and symptoms indicating need for more acute intervention. Patient verbalized understanding and all questions were answered. Patient received an After-Visit Summary.    Orders Placed This Encounter  Procedures   Flu Vaccine QUAD High Dose(Fluad)   Meds  ordered this encounter  Medications   doxycycline (VIBRA-TABS) 100 MG tablet    Sig: Take 1 tablet (100 mg total) by mouth 2 (two) times daily.    Dispense:  14 tablet    Refill:  0   Referral Orders  No referral(s) requested today     Note is dictated utilizing voice recognition software. Although note has been proof read prior to signing, occasional typographical errors still can be missed. If any questions arise, please do not hesitate to call for verification.   electronically signed by:  Howard Pouch, DO  [place]

## 2020-10-06 NOTE — Telephone Encounter (Signed)
(  BPs the last 2 wks avg 130/60, HR 50-60).  These bp's look good.  OK to increase entresto to WHOLE tab twice daily. Send report of bp's in 1 wk, earlier if bp <100/50.  I'll do new rx for 3 of the '100mg'$  tubes of diclofenac with 3 RFs.  Will RF hydrocodone.

## 2020-10-06 NOTE — Patient Instructions (Addendum)
Bag Balm for skin and can place on wounds as well.  Keep legs elevated. Extend antibiotics for 7 more days. Different antibiotic for extended coverage.    Venous Ulcer A venous ulcer is a shallow sore on your lower leg. Venous ulcer is the most common type of lower leg ulcer. You may have venous ulcers on one leg or on both legs. This condition most often develops around your ankles. This type of ulcer may last for a long time (chronic ulcer) or it may return often (recurrent ulcer). What are the causes? This condition is caused by poor blood flow in your legs. The poor flow causes blood to pool in your legs. This can break the skin, causing an ulcer. What increases the risk? You are more likely to develop this condition if: You are 41 years of age or older. You are female. You are overweight. You are not active. You have had a leg ulcer in the past. You have varicose veins. You have clots in your lower leg veins (deep vein thrombosis). You have inflammation of your leg veins (phlebitis). You have recently been pregnant. You smoke. What are the signs or symptoms? The main symptom of this condition is an open sore near your ankle. Other symptoms may include: Swelling. Thick skin. Fluid coming from the ulcer. Bleeding. Itching. Pain and swelling. This gets worse when you stand up and feels better when you raise your leg. Blotchy skin. Dark skin. How is this treated? This condition may be treated by: Keeping your leg raised (elevated). Wearing a type of bandage or stocking to keep pressure (compression) on the veins of your leg. Taking medicines, including antibiotic medicines. Cleaning your ulcer and removing any dead tissue from the wound. Using bandages and wraps that have medicines in them to cover your ulcer. Closing the wound using a piece of skin taken from another area of your body (graft). Follow these instructions at home: Medicines Take or apply over-the-counter  and prescription medicines only as told by your doctor. If you were prescribed an antibiotic medicine, take it as told by your doctor. Do not stop using the antibiotic even if you start to feel better. Ask your doctor if you should take aspirin before long trips. Wound care Follow instructions from your doctor about how to take care of your wound. Make sure you: Wash your hands with soap and water before and after you change your bandage (dressing). If you cannot use soap and water, use hand sanitizer. Change your bandage as told by your doctor. If you had a skin graft, leave stitches (sutures) in place. These may need to stay in place for 2 weeks or longer. Ask when you should remove your bandage. If your bandage is dry and sticks to your leg when you try to remove it, moisten or wet the bandage with saline solution or water to make it easier to remove. Once your bandage is off, check your wound each day for signs of infection. Have a caregiver do this for you if you are not able to do it yourself. Check for: More redness, swelling, or pain. More fluid or blood. Warmth. Pus or a bad smell. Activity Do not sit for a long time without moving. Get up to take short walks every 1-2 hours. This is important. Ask for help if you feel weak or unsteady. Ask your doctor what level of activity is safe for you. Rest with your legs raised during the day. If you can, keep  your legs above the level of your heart for 30 minutes, 3-4 times a day, or as told by your doctor. Do not sit with your legs crossed. General instructions  Wear elastic stockings, compression stockings, or support hose as told by your doctor. Raise the foot of your bed as told by your doctor. Do not use any products that contain nicotine or tobacco, such as cigarettes, e-cigarettes, and chewing tobacco. If you need help quitting, ask your doctor. Keep all follow-up visits as told by your doctor. This is important. Contact a doctor  if: Your ulcer is getting larger or is not healing. Your pain gets worse. Get help right away if: You have more redness, swelling, or pain around your ulcer. You have more fluid or blood coming from your ulcer. Your ulcer feels warm to the touch. You have pus or a bad smell coming from your ulcer. You have a fever. Summary A venous ulcer is a shallow sore on your lower leg. Follow instructions from your doctor about how to take care of your wound. Check your wound each day for signs of infection. Take over-the-counter and prescription medicines only as told by your doctor. Keep all follow-up visits as told by your doctor. This is important. This information is not intended to replace advice given to you by your health care provider. Make sure you discuss any questions you have with your health care provider. Document Revised: 09/06/2017 Document Reviewed: 09/06/2017 Elsevier Patient Education  [place].

## 2020-10-06 NOTE — Telephone Encounter (Signed)
Pt was seen by Dr.Kuneff today, pease refer to OV note as concern for soft boot to aid in walking because of heels and if she needed more antibiotics were addressed. Daughter wanted to know about refill for hydrocodone 09/23/20(42,0) and getting more than 1 tube of diclofenac? Last refill 09/20/20, 100g with 3 refills. Med currently pending for Hydrocodone.  Recent BP readings brought as well for review and placed on PCP desk for review

## 2020-10-07 NOTE — Telephone Encounter (Signed)
LM for pt to returncall

## 2020-10-07 NOTE — Telephone Encounter (Signed)
Spoke with daughter, [name] regarding bp recommendations and refills. She voiced understanding. Mom is doing well on doxycycline

## 2020-10-08 ENCOUNTER — Ambulatory Visit: Payer: PPO | Admitting: Family Medicine

## 2020-10-09 DIAGNOSIS — L89622 Pressure ulcer of left heel, stage 2: Secondary | ICD-10-CM | POA: Diagnosis not present

## 2020-10-09 DIAGNOSIS — Z7901 Long term (current) use of anticoagulants: Secondary | ICD-10-CM | POA: Diagnosis not present

## 2020-10-09 DIAGNOSIS — L89892 Pressure ulcer of other site, stage 2: Secondary | ICD-10-CM | POA: Diagnosis not present

## 2020-10-09 DIAGNOSIS — L89612 Pressure ulcer of right heel, stage 2: Secondary | ICD-10-CM | POA: Diagnosis not present

## 2020-10-09 DIAGNOSIS — I5032 Chronic diastolic (congestive) heart failure: Secondary | ICD-10-CM | POA: Diagnosis not present

## 2020-10-09 DIAGNOSIS — N189 Chronic kidney disease, unspecified: Secondary | ICD-10-CM | POA: Diagnosis not present

## 2020-10-09 DIAGNOSIS — I480 Paroxysmal atrial fibrillation: Secondary | ICD-10-CM | POA: Diagnosis not present

## 2020-10-09 DIAGNOSIS — I38 Endocarditis, valve unspecified: Secondary | ICD-10-CM | POA: Diagnosis not present

## 2020-10-09 DIAGNOSIS — Z8781 Personal history of (healed) traumatic fracture: Secondary | ICD-10-CM | POA: Diagnosis not present

## 2020-10-09 DIAGNOSIS — Z79891 Long term (current) use of opiate analgesic: Secondary | ICD-10-CM | POA: Diagnosis not present

## 2020-10-09 DIAGNOSIS — Z993 Dependence on wheelchair: Secondary | ICD-10-CM | POA: Diagnosis not present

## 2020-10-09 DIAGNOSIS — I130 Hypertensive heart and chronic kidney disease with heart failure and stage 1 through stage 4 chronic kidney disease, or unspecified chronic kidney disease: Secondary | ICD-10-CM | POA: Diagnosis not present

## 2020-10-09 DIAGNOSIS — Z9981 Dependence on supplemental oxygen: Secondary | ICD-10-CM | POA: Diagnosis not present

## 2020-10-11 ENCOUNTER — Other Ambulatory Visit: Payer: Self-pay | Admitting: Family Medicine

## 2020-10-12 ENCOUNTER — Telehealth: Payer: Self-pay

## 2020-10-12 NOTE — Telephone Encounter (Signed)
Spoke with Arvil Chaco with [facility]. She is currently with pt.  She has several spots, 5 open spots/ulcers of pitting edema; her right leg is worse than left. No new ulcers on left besides pressure ulcer. She needs compression to help with swelling. She has been keeping her legs elevated. Stage 2 on R and L heel. Since she was not able to use calcium alginateon, what else can she use? Still taking antibiotic 2 left, doxycycline prescribed during 9/14 appt.    CB # is [phone]

## 2020-10-13 NOTE — Telephone Encounter (Signed)
LVM for Stephany to call back to discuss

## 2020-10-13 NOTE — Telephone Encounter (Signed)
Duoderm can be applied to the ulcers. No new antibiotics. Keep appt with me 9/27.

## 2020-10-14 NOTE — Telephone Encounter (Signed)
Signed and put in box to go up front. Signed:  Crissie Sickles, MD           10/14/2020

## 2020-10-14 NOTE — Telephone Encounter (Signed)
Received written orders for OT services, Placed on PCP desk to review and sign, if appropriate.

## 2020-10-14 NOTE — Telephone Encounter (Signed)
Spoke with [place] regarding recommendations.

## 2020-10-19 ENCOUNTER — Ambulatory Visit (INDEPENDENT_AMBULATORY_CARE_PROVIDER_SITE_OTHER): Payer: PPO | Admitting: Family Medicine

## 2020-10-19 ENCOUNTER — Encounter: Payer: Self-pay | Admitting: Family Medicine

## 2020-10-19 ENCOUNTER — Other Ambulatory Visit: Payer: Self-pay

## 2020-10-19 VITALS — BP 140/84 | HR 86 | Temp 97.4°F | Ht 66.0 in

## 2020-10-19 DIAGNOSIS — L89612 Pressure ulcer of right heel, stage 2: Secondary | ICD-10-CM | POA: Diagnosis not present

## 2020-10-19 DIAGNOSIS — R600 Localized edema: Secondary | ICD-10-CM

## 2020-10-19 DIAGNOSIS — N183 Chronic kidney disease, stage 3 unspecified: Secondary | ICD-10-CM

## 2020-10-19 DIAGNOSIS — N2889 Other specified disorders of kidney and ureter: Secondary | ICD-10-CM

## 2020-10-19 LAB — BASIC METABOLIC PANEL
BUN: 22 mg/dL (ref 6–23)
CO2: 30 mEq/L (ref 19–32)
Calcium: 9.2 mg/dL (ref 8.4–10.5)
Chloride: 101 mEq/L (ref 96–112)
Creatinine, Ser: 0.86 mg/dL (ref 0.40–1.20)
GFR: 61.25 mL/min (ref >60.00)
Glucose, Bld: 107 mg/dL — ABNORMAL HIGH (ref 70–99)
Potassium: 4.4 mEq/L (ref 3.5–5.1)
Sodium: 140 mEq/L (ref 135–145)

## 2020-10-19 NOTE — Telephone Encounter (Signed)
LVM for Stephany to see when next [facility] visit is and rx for duoderm. This is not available otc, only by prescription. Spoke with pharmacy to confirm this. This has to be specially ordered and could be available next day.

## 2020-10-19 NOTE — Telephone Encounter (Signed)
Amanda Sellers, Spoke with Arvil Chaco, [facility] nurse and they are still seeing patient and next visit is tomorrow or Thursday. She has ordered some for patient as well and will be delivered to her home soon. Pt's daughter, Jenny Reichmann advised of all updates.

## 2020-10-19 NOTE — Progress Notes (Signed)
OFFICE VISIT  10/19/2020  CC:  Chief Complaint  Patient presents with   Follow-up    edema   HPI:    Patient is a 87 y.o. Caucasian female who presents accompanied by her daughter Cindi for 2 wk f/u bilat LE edema and ulcers, recent cellulitis->in the setting of CRI III. She was seen by Dr. Raoul Pitch 10/06/20. A/P as of that visit: "Venous stasis ulcer of right heel with varicose veins, unspecified ulcer stage ([place]) Overall per patient and her family member today she is seeing great improvement in the swelling and redness of her extremities since finishing Keflex prescribed by PCP.  However since stopping the antibiotics she has noticed some mild redness resurfacing, and I do appreciate this redness around the right foot ulceration.  The remainder of her ulcerations appear to be healing pretty well. Continue wound care per home health nurse. Discussed bag balm ointment use after cleansing. Elected to extend course of antibiotics with doxycycline twice daily Provided postop shoe to help offset weight with ambulation from bilateral heels. Patient understands to keep feet elevated whenever possible and continue diuretic use per her PCP instruction. Limiting sodium is very important.  She states she is attempting to do this more strictly. Follow-up closely with her PCP in 1 to 2 weeks for recheck."  INTERIM HX: Wounds and LE edema are gradually improving. R heel still hurting, taking vicodin 1 tab bid, esp in order to get to sleep. She is trying to avoid pressure on heel, not wearing shoes. [place] nursing was last at her home last week. Taking 40mg  lasix bid and urinating well.  Past Medical History:  Diagnosis Date   Anemia of chronic disease    Anticoagulation adequate, Eliquis 10/16/2014   Anxiety    clonaz helps this AND her breathing   Cholelithiasis without cholecystitis 07/2020   noted on CT abd/pelv at [facility]   Chronic combined systolic and diastolic congestive heart  failure ([place]) 40/81/4481   Complicated by atrial fibrillation; Echo 07/23/14: mild LVH, EF 50-55%, Gr 2 DD.;  Echo May 15, 2019: EF 35 to 40%, GRII DD.  (In setting of CHF exacerbation, class III)-> baseline class II symptoms. 09/2019 EF 55-60%, nl wall motion and LV fxn, grd II DD, mod MR.   Chronic renal insufficiency, stage III (moderate) (HCC)    GFR @ 50   COPD (chronic obstructive pulmonary disease) ([place])    recently noted on Xray, does not have any problems   Diverticulosis    Follicular lymphoma ([place])    Non Hodgkins B cell lymphoma; s/p chemo summer 2016--in remission still as of 09/2018: continue obs q 33mo   Hiatal hernia 07/2020   moderate size-noted on CT abd/pelv at [place] (hyperlipidemia)     06/2014   HTN (hypertension)    Hypothyroidism    IFG (impaired fasting glucose)    Lip cancer    Dr. Janace Hoard excised this: invasive SCC--no sign of cancer at ENT f/u 10/2015   Microcytic anemia    transfused 3 U total in hosp 07/2014   Obesity (BMI 30-39.9)    Pancytopenia due to antineoplastic chemotherapy ([place])    Persistent atrial fibrillation ([place])    With RVR; elec cardioversion 11/30/14.  On Amio since 10/2014; Dr. Lenna Gilford following her from pulm standpoint regarding this med.   Positive occult stool blood test 08/08/2014   Endoscopies ok 08/2014   Primary osteoarthritis of both knees 04/2016   Dr. Gladstone Lighter: bone on  bone--to get euflexxa to both knees.   Protein calorie malnutrition ([place]) 2022   Pulmonary hypertension due to left heart disease ([place])    RLCP 05/19/2019: EF 35-40%. D1 50%, RI 70%, dLM-LAD 30%.  mPAP 43 mmHg - (62/19 mmHg), PCWP  28 mmHg (V wave 41 mmHg) LVEDP 34 mmHg   Pulmonary metastases (HCC) 07/24/2014   S/p left hip fracture 05/2020   ORIF   Vitamin B 12 deficiency    Vitamin D deficiency    dose increased to 50 K U tab TWICE weekly 04/2017    Past Surgical History:  Procedure Laterality Date   ABDOMINAL HYSTERECTOMY  1978   BONE MARROW  BIOPSY  07/24/14   BREAST BIOPSY     CARDIOVERSION N/A 10/16/2014   Procedure: CARDIOVERSION;  Surgeon: Lelon Perla, MD;  Location: [facility] ENDOSCOPY;  Service: Cardiovascular;  Laterality: N/A;   CARDIOVERSION N/A 11/30/2014   Procedure: CARDIOVERSION;  Surgeon: Larey Dresser, MD;  Location: [facility];  Service: Cardiovascular;  Laterality: N/A;   CATARACT EXTRACTION  2020   OU: Dr. Katy Fitch   COLONOSCOPY  09/23/14   small hemorrhoids, otherwise normal (performed for IDA and heme+ stool)   COLONOSCOPY     INTRAMEDULLARY (IM) NAIL INTERTROCHANTERIC Left 05/25/2020   Procedure: INTRAMEDULLARY (IM) NAIL INTERTROCHANTRIC;  Surgeon: Erle Crocker, MD;  Location: [facility];  Service: Orthopedics;  Laterality: Left;   MASS EXCISION Left 07/15/2015   Left lower lip mass--invasive SCC w/negative margins.  Procedure: EXCISION MASS;  Surgeon: Melissa Montane, MD;  Location: [facility];  Service: ENT;  Laterality: Left;  Wedge excision left lower lip mass   NM MYOVIEW LTD  10/29/2014   medium size mild surgery defect in the mid anterior and apical anterior location suggestive of breast attenuation. No reversibility. LOW RISK   PFTs  02/2015   Restriction with diffusion defect: cardiologist referred her to pulm to help interpret PFTs and decide whether she has amiodarone toxicity   RIGHT/LEFT HEART CATH AND CORONARY ANGIOGRAPHY N/A 05/19/2019   Procedure: RIGHT/LEFT HEART CATH AND CORONARY ANGIOGRAPHY;  Surgeon: Troy Sine, MD;  Location: [facility] CV LAB;; EF 35-40%. D1 50%, RI 70%, dLM-LAD 30%.  mPAP 43 mmHg - (62/19 mmHg), PCWP  28 mmHg (V wave 41 mmHg) LVEDP 34 mmHg   TEE WITHOUT CARDIOVERSION N/A 10/16/2014   Procedure: TRANSESOPHAGEAL ECHOCARDIOGRAM (TEE);  Surgeon: Lelon Perla, MD;  Location[facility] ENDOSCOPY;  Service: Cardiovascular;  Laterality: N/A;   TRANSTHORACIC ECHOCARDIOGRAM  10/09/2019   EF 55 to 60%.  GRII DD.  No R WMA.  Normal PAP and RAP.  Severe LA dilation.  Mild RA dilation.  Moderate MR  (likely related to annular dilation in the setting of severe LAE.  Mild aortic valve sclerosis-no stenosis.   TRANSTHORACIC ECHOCARDIOGRAM  05/15/2019   Acute CHF exacerbation:  EF 35-40%.  Moderately reduced function.  (Previous EF was 55 to 60%) global HK.  GRII DD.  Mild LA dilation.  Normal RV size.   UPPER GI ENDOSCOPY  09/23/2014   small hiatus hernia, nodules in stomach biopsied (chronic active erosive atrophic gastritis with intestinal metaplasia--no dysplasia or malignancy) otherwise normal    Outpatient Medications Prior to Visit  Medication Sig Dispense Refill   acetaminophen (TYLENOL) 500 MG tablet Take 1,000 mg by mouth every 6 (six) hours as needed for moderate pain or headache.     amiodarone (PACERONE) 200 MG tablet TAKE ONE TABLET BY MOUTH EVERY DAY. (Patient taking differently: Take 200  mg by mouth daily.) 90 tablet 3   apixaban (ELIQUIS) 5 MG TABS tablet Take 1 tablet (5 mg total) by mouth 2 (two) times daily. 56 tablet 0   cyanocobalamin (,VITAMIN B-12,) 1000 MCG/ML injection INJECT 1 ML INTO THE SKIN EVERY 30 DAYS. 3 mL 3   diclofenac Sodium (VOLTAREN) 1 % GEL Apply 2 g topically 4 (four) times daily. Apply to LE joints that are in pain. 300 g 3   doxycycline (VIBRA-TABS) 100 MG tablet Take 1 tablet (100 mg total) by mouth 2 (two) times daily. 14 tablet 0   ferrous sulfate 325 (65 FE) MG tablet Take 1 tablet (325 mg total) by mouth 2 (two) times daily with a meal. 30 tablet 1   fish oil-omega-3 fatty acids 1000 MG capsule Take 2 g by mouth daily.     fluticasone (CUTIVATE) 0.05 % cream Apply to affected area bid prn (Patient taking differently: Apply 1 application topically 2 (two) times daily as needed (itching).) 30 g 2   furosemide (LASIX) 40 MG tablet TAKE 1 TABLET BY MOUTH EVERY DAY (Patient taking differently: Take 80 mg by mouth daily.) 90 tablet 1   HYDROcodone-acetaminophen (NORCO/VICODIN) 5-325 MG tablet 1-2 tabs po q6h prn pain 60 tablet 0   hydrOXYzine (VISTARIL)  25 MG capsule 1-2 tabs po qhs for sleep 60 capsule 3   levothyroxine (SYNTHROID) 100 MCG tablet TAKE 1 TABLET BY MOUTH EVERY DAY BEFORE BREAKFAST (Patient taking differently: Take 100 mcg by mouth daily before breakfast.) 90 tablet 1   OXYGEN Inhale into the lungs. 1Lt at bedtime  as needed     Potassium Chloride ER 20 MEQ TBCR TAKE 1 TABLET BY MOUTH EVERY DAY 30 tablet 0   rosuvastatin (CRESTOR) 20 MG tablet TAKE 1 TABLET BY MOUTH EVERY DAY (Patient taking differently: Take 20 mg by mouth daily.) 90 tablet 3   sacubitril-valsartan (ENTRESTO) 24-26 MG Take 1 tablet by mouth 2 (two) times daily. 60 tablet 11   Vitamin D, Ergocalciferol, (DRISDOL) 1.25 MG (50000 UNIT) CAPS capsule TAKE 1 CAPSULE (50,000 UNITS TOTAL) BY MOUTH 2 (TWO) TIMES A WEEK. (Patient taking differently: Take 50,000 Units by mouth 2 (two) times a week. Sunday and thursday) 24 capsule 3   cephALEXin (KEFLEX) 500 MG capsule Take 1 capsule (500 mg total) by mouth 3 (three) times daily. (Patient not taking: Reported on 10/19/2020) 30 capsule 0   No facility-administered medications prior to visit.    Allergies  Allergen Reactions   Aspirin Itching   Calcium Alginate Other (See Comments)    Excessive burning   Morphine     Other reaction(s): Hallucinations   Iodinated Diagnostic Agents Rash and Other (See Comments)    CT Contrast    ROS As per HPI  PE: Vitals with BMI 10/19/2020 10/06/2020 09/23/2020  Height 5\' 6"  - 5\' 6"   Weight (No Data) 198 lbs (No Data)  BMI - 70.26 -  Systolic 378 588 502  Diastolic 84 50 68  Pulse 86 60 94   Gen: Alert, well appearing.  Patient is oriented to person, place, time, and situation. AFFECT: pleasant, lucid thought and speech. LEGS: bilat 1+ pitting edema and moderate lymphedema bilat. Left leg/foot w/out any skin lesion. R calf with 2 small scabs w/out erythema or tenderness. R heel with stage 2 pressure ulcer : 3.5 cm by 2 cm, good granulation tissue, no exudate or foul odor.     LABS:    Chemistry      Component  Value Date/Time   NA 142 09/23/2020 1144   NA 141 05/29/2019 1031   NA 143 11/17/2014 0801   K 4.5 09/23/2020 1144   K 3.6 11/17/2014 0801   CL 100 09/23/2020 1144   CO2 33 (H) 09/23/2020 1144   CO2 30 (H) 11/17/2014 0801   BUN 20 09/23/2020 1144   BUN 18 05/29/2019 1031   BUN 16.3 11/17/2014 0801   CREATININE 1.02 09/23/2020 1144   CREATININE 0.95 09/10/2020 1451   CREATININE 0.7 11/17/2014 0801      Component Value Date/Time   CALCIUM 9.0 09/23/2020 1144   CALCIUM 8.9 11/17/2014 0801   ALKPHOS 30 (L) 05/30/2020 0139   ALKPHOS 70 11/17/2014 0801   AST 19 09/10/2020 1451   AST 32 11/17/2014 0801   ALT 13 09/10/2020 1451   ALT 30 11/17/2014 0801   BILITOT 0.7 09/10/2020 1451   BILITOT 0.5 05/29/2019 1031   BILITOT 0.55 11/17/2014 0801     Lab Results  Component Value Date   WBC 5.5 07/23/2020   HGB 13.2 07/23/2020   HCT 40.1 07/23/2020   MCV 96.4 07/23/2020   PLT 146 (L) 07/23/2020    IMPRESSION AND PLAN:  1) Bilat LE edema (venous insufficiency, immobility) with hx of R leg stasis ulcers and R heel pressure ulcer. Her edema is stable and not likely to improve any further. Goal is to try to keep it from worsening and leading to further prob with ulcers. Her stasis ulcers and cellulitis have resolved.   Her R heel pressure ulcer is improving.  Will try to extend [place] for wound care assistance. Will try to get duoderm going for heel. When heel ulcer is resolved we'll try to get her some compression stockings.  Cont lasix 40mg  bid and low Na diet, elevate when able.  Given her CRI will keep close eye on lytes/c->bmet today.  An After Visit Summary was printed and given to the patient.  FOLLOW UP: No follow-ups on file.  Signed:  Crissie Sickles, MD           10/19/2020

## 2020-10-20 ENCOUNTER — Telehealth: Payer: Self-pay

## 2020-10-20 DIAGNOSIS — L89612 Pressure ulcer of right heel, stage 2: Secondary | ICD-10-CM | POA: Diagnosis not present

## 2020-10-20 DIAGNOSIS — Z7901 Long term (current) use of anticoagulants: Secondary | ICD-10-CM | POA: Diagnosis not present

## 2020-10-20 DIAGNOSIS — I480 Paroxysmal atrial fibrillation: Secondary | ICD-10-CM | POA: Diagnosis not present

## 2020-10-20 DIAGNOSIS — L89622 Pressure ulcer of left heel, stage 2: Secondary | ICD-10-CM | POA: Diagnosis not present

## 2020-10-20 DIAGNOSIS — Z8781 Personal history of (healed) traumatic fracture: Secondary | ICD-10-CM | POA: Diagnosis not present

## 2020-10-20 DIAGNOSIS — I130 Hypertensive heart and chronic kidney disease with heart failure and stage 1 through stage 4 chronic kidney disease, or unspecified chronic kidney disease: Secondary | ICD-10-CM | POA: Diagnosis not present

## 2020-10-20 DIAGNOSIS — I5032 Chronic diastolic (congestive) heart failure: Secondary | ICD-10-CM | POA: Diagnosis not present

## 2020-10-20 DIAGNOSIS — Z79891 Long term (current) use of opiate analgesic: Secondary | ICD-10-CM | POA: Diagnosis not present

## 2020-10-20 DIAGNOSIS — I38 Endocarditis, valve unspecified: Secondary | ICD-10-CM | POA: Diagnosis not present

## 2020-10-20 DIAGNOSIS — Z9981 Dependence on supplemental oxygen: Secondary | ICD-10-CM | POA: Diagnosis not present

## 2020-10-20 DIAGNOSIS — N189 Chronic kidney disease, unspecified: Secondary | ICD-10-CM | POA: Diagnosis not present

## 2020-10-20 DIAGNOSIS — L89892 Pressure ulcer of other site, stage 2: Secondary | ICD-10-CM | POA: Diagnosis not present

## 2020-10-20 DIAGNOSIS — Z993 Dependence on wheelchair: Secondary | ICD-10-CM | POA: Diagnosis not present

## 2020-10-20 NOTE — Telephone Encounter (Signed)
Received written orders for SN. Placed on PCP desk to review and sign, if appropriate.

## 2020-10-20 NOTE — Telephone Encounter (Signed)
Noted  

## 2020-10-21 ENCOUNTER — Other Ambulatory Visit: Payer: Self-pay | Admitting: Family Medicine

## 2020-10-21 NOTE — Telephone Encounter (Signed)
Signed and put in box to go up front. Signed:  Crissie Sickles, MD           10/21/2020

## 2020-10-22 DIAGNOSIS — I130 Hypertensive heart and chronic kidney disease with heart failure and stage 1 through stage 4 chronic kidney disease, or unspecified chronic kidney disease: Secondary | ICD-10-CM | POA: Diagnosis not present

## 2020-10-22 DIAGNOSIS — L89622 Pressure ulcer of left heel, stage 2: Secondary | ICD-10-CM | POA: Diagnosis not present

## 2020-10-22 DIAGNOSIS — L89612 Pressure ulcer of right heel, stage 2: Secondary | ICD-10-CM | POA: Diagnosis not present

## 2020-10-22 DIAGNOSIS — Z8781 Personal history of (healed) traumatic fracture: Secondary | ICD-10-CM | POA: Diagnosis not present

## 2020-10-22 DIAGNOSIS — I5032 Chronic diastolic (congestive) heart failure: Secondary | ICD-10-CM | POA: Diagnosis not present

## 2020-10-22 DIAGNOSIS — N189 Chronic kidney disease, unspecified: Secondary | ICD-10-CM | POA: Diagnosis not present

## 2020-10-22 DIAGNOSIS — L89892 Pressure ulcer of other site, stage 2: Secondary | ICD-10-CM | POA: Diagnosis not present

## 2020-10-22 DIAGNOSIS — I38 Endocarditis, valve unspecified: Secondary | ICD-10-CM | POA: Diagnosis not present

## 2020-10-22 DIAGNOSIS — Z7901 Long term (current) use of anticoagulants: Secondary | ICD-10-CM | POA: Diagnosis not present

## 2020-10-22 DIAGNOSIS — Z79891 Long term (current) use of opiate analgesic: Secondary | ICD-10-CM | POA: Diagnosis not present

## 2020-10-22 DIAGNOSIS — Z9981 Dependence on supplemental oxygen: Secondary | ICD-10-CM | POA: Diagnosis not present

## 2020-10-22 DIAGNOSIS — Z993 Dependence on wheelchair: Secondary | ICD-10-CM | POA: Diagnosis not present

## 2020-10-22 DIAGNOSIS — I480 Paroxysmal atrial fibrillation: Secondary | ICD-10-CM | POA: Diagnosis not present

## 2020-10-25 DIAGNOSIS — I509 Heart failure, unspecified: Secondary | ICD-10-CM | POA: Diagnosis not present

## 2020-10-25 DIAGNOSIS — R609 Edema, unspecified: Secondary | ICD-10-CM | POA: Diagnosis not present

## 2020-10-26 DIAGNOSIS — Z79891 Long term (current) use of opiate analgesic: Secondary | ICD-10-CM | POA: Diagnosis not present

## 2020-10-26 DIAGNOSIS — L89612 Pressure ulcer of right heel, stage 2: Secondary | ICD-10-CM | POA: Diagnosis not present

## 2020-10-26 DIAGNOSIS — I480 Paroxysmal atrial fibrillation: Secondary | ICD-10-CM | POA: Diagnosis not present

## 2020-10-26 DIAGNOSIS — Z9981 Dependence on supplemental oxygen: Secondary | ICD-10-CM | POA: Diagnosis not present

## 2020-10-26 DIAGNOSIS — N189 Chronic kidney disease, unspecified: Secondary | ICD-10-CM | POA: Diagnosis not present

## 2020-10-26 DIAGNOSIS — I38 Endocarditis, valve unspecified: Secondary | ICD-10-CM | POA: Diagnosis not present

## 2020-10-26 DIAGNOSIS — Z993 Dependence on wheelchair: Secondary | ICD-10-CM | POA: Diagnosis not present

## 2020-10-26 DIAGNOSIS — Z7901 Long term (current) use of anticoagulants: Secondary | ICD-10-CM | POA: Diagnosis not present

## 2020-10-26 DIAGNOSIS — I5032 Chronic diastolic (congestive) heart failure: Secondary | ICD-10-CM | POA: Diagnosis not present

## 2020-10-26 DIAGNOSIS — I130 Hypertensive heart and chronic kidney disease with heart failure and stage 1 through stage 4 chronic kidney disease, or unspecified chronic kidney disease: Secondary | ICD-10-CM | POA: Diagnosis not present

## 2020-10-26 DIAGNOSIS — L89892 Pressure ulcer of other site, stage 2: Secondary | ICD-10-CM | POA: Diagnosis not present

## 2020-10-26 DIAGNOSIS — L89622 Pressure ulcer of left heel, stage 2: Secondary | ICD-10-CM | POA: Diagnosis not present

## 2020-10-26 DIAGNOSIS — Z8781 Personal history of (healed) traumatic fracture: Secondary | ICD-10-CM | POA: Diagnosis not present

## 2020-10-28 DIAGNOSIS — Z993 Dependence on wheelchair: Secondary | ICD-10-CM | POA: Diagnosis not present

## 2020-10-28 DIAGNOSIS — L89612 Pressure ulcer of right heel, stage 2: Secondary | ICD-10-CM | POA: Diagnosis not present

## 2020-10-28 DIAGNOSIS — I130 Hypertensive heart and chronic kidney disease with heart failure and stage 1 through stage 4 chronic kidney disease, or unspecified chronic kidney disease: Secondary | ICD-10-CM | POA: Diagnosis not present

## 2020-10-28 DIAGNOSIS — I5032 Chronic diastolic (congestive) heart failure: Secondary | ICD-10-CM | POA: Diagnosis not present

## 2020-10-28 DIAGNOSIS — Z8781 Personal history of (healed) traumatic fracture: Secondary | ICD-10-CM | POA: Diagnosis not present

## 2020-10-28 DIAGNOSIS — I38 Endocarditis, valve unspecified: Secondary | ICD-10-CM | POA: Diagnosis not present

## 2020-10-28 DIAGNOSIS — L89622 Pressure ulcer of left heel, stage 2: Secondary | ICD-10-CM | POA: Diagnosis not present

## 2020-10-28 DIAGNOSIS — L89892 Pressure ulcer of other site, stage 2: Secondary | ICD-10-CM | POA: Diagnosis not present

## 2020-10-28 DIAGNOSIS — N189 Chronic kidney disease, unspecified: Secondary | ICD-10-CM | POA: Diagnosis not present

## 2020-10-28 DIAGNOSIS — Z7901 Long term (current) use of anticoagulants: Secondary | ICD-10-CM | POA: Diagnosis not present

## 2020-10-28 DIAGNOSIS — Z79891 Long term (current) use of opiate analgesic: Secondary | ICD-10-CM | POA: Diagnosis not present

## 2020-10-28 DIAGNOSIS — I480 Paroxysmal atrial fibrillation: Secondary | ICD-10-CM | POA: Diagnosis not present

## 2020-10-28 DIAGNOSIS — Z9981 Dependence on supplemental oxygen: Secondary | ICD-10-CM | POA: Diagnosis not present

## 2020-11-04 DIAGNOSIS — I480 Paroxysmal atrial fibrillation: Secondary | ICD-10-CM | POA: Diagnosis not present

## 2020-11-04 DIAGNOSIS — Z9981 Dependence on supplemental oxygen: Secondary | ICD-10-CM | POA: Diagnosis not present

## 2020-11-04 DIAGNOSIS — Z7901 Long term (current) use of anticoagulants: Secondary | ICD-10-CM | POA: Diagnosis not present

## 2020-11-04 DIAGNOSIS — I5032 Chronic diastolic (congestive) heart failure: Secondary | ICD-10-CM | POA: Diagnosis not present

## 2020-11-04 DIAGNOSIS — I38 Endocarditis, valve unspecified: Secondary | ICD-10-CM | POA: Diagnosis not present

## 2020-11-04 DIAGNOSIS — L89622 Pressure ulcer of left heel, stage 2: Secondary | ICD-10-CM | POA: Diagnosis not present

## 2020-11-04 DIAGNOSIS — N189 Chronic kidney disease, unspecified: Secondary | ICD-10-CM | POA: Diagnosis not present

## 2020-11-04 DIAGNOSIS — L89612 Pressure ulcer of right heel, stage 2: Secondary | ICD-10-CM | POA: Diagnosis not present

## 2020-11-04 DIAGNOSIS — Z79891 Long term (current) use of opiate analgesic: Secondary | ICD-10-CM | POA: Diagnosis not present

## 2020-11-04 DIAGNOSIS — L89892 Pressure ulcer of other site, stage 2: Secondary | ICD-10-CM | POA: Diagnosis not present

## 2020-11-04 DIAGNOSIS — I130 Hypertensive heart and chronic kidney disease with heart failure and stage 1 through stage 4 chronic kidney disease, or unspecified chronic kidney disease: Secondary | ICD-10-CM | POA: Diagnosis not present

## 2020-11-04 DIAGNOSIS — Z8781 Personal history of (healed) traumatic fracture: Secondary | ICD-10-CM | POA: Diagnosis not present

## 2020-11-04 DIAGNOSIS — Z993 Dependence on wheelchair: Secondary | ICD-10-CM | POA: Diagnosis not present

## 2020-11-11 ENCOUNTER — Other Ambulatory Visit: Payer: Self-pay | Admitting: Family Medicine

## 2020-11-11 ENCOUNTER — Telehealth: Payer: Self-pay | Admitting: Family Medicine

## 2020-11-11 MED ORDER — HYDROCODONE-ACETAMINOPHEN 5-325 MG PO TABS: ORAL_TABLET | ORAL | 0 refills | Status: DC

## 2020-11-11 MED ORDER — AMOXICILLIN-POT CLAVULANATE 875-125 MG PO TABS: 1.0000 | ORAL_TABLET | Freq: Two times a day (BID) | ORAL | 0 refills | Status: DC

## 2020-11-11 NOTE — Telephone Encounter (Signed)
OK, increase lasix to 1 and 1/2 of the 40mg  tabs twice a day for each of the next 3 days. Also, I'll do rx for antibiotic (augmentin).

## 2020-11-11 NOTE — Telephone Encounter (Signed)
Spoke with pt, she has been having limited mobility and would not be able to come in for appt currently due to swelling.

## 2020-11-11 NOTE — Telephone Encounter (Signed)
Spoke with pt and advised of med recommendations.

## 2020-11-11 NOTE — Telephone Encounter (Signed)
Amanda Sellers w/[facility] Ph# [phone]   Pt has cellulitis is lower right leg. Pts daughter is asking if antibiotic needs to be ordered again. Pt is also needing refill on pain meds as she only has 4 left (HYDROcodone-acetaminophen (NORCO/VICODIN) 5-325 MG tablet ).  [id number] - [place], [place] - [address]  [address] [place], [place] [address]  Phone:  [phone]  Fax:  [phone]

## 2020-11-11 NOTE — Telephone Encounter (Signed)
Can she come in to see me tomorrow at 10:30 or 4 oclock? The swelling is not likely to get better-->the goal is to keep it from getting worse and leading to skin ulceration again. Regarding the nurse's mention of cellulitis I MUCH prefer to see the area and make that diagnosis myself before just prescribing antibiotics. I'll send norco in.

## 2020-11-11 NOTE — Progress Notes (Signed)
A user error has taken place: encounter opened in error, closed for administrative reasons.

## 2020-11-11 NOTE — Telephone Encounter (Addendum)
Spoke with Colletta Maryland and one of pt's daughters reported R lower leg to have 2+ sitting, pitting edema. They were advised during last appt continue lasix 40mg  bid but this is not helping.  Requesting: [place]: 11/04/18 UDS: N/A Last Visit:10/19/20 Next Visit:N/A Last Refill:10/06/20(60,0)  Please review and advise. Medication pending

## 2020-11-12 ENCOUNTER — Telehealth: Payer: Self-pay

## 2020-11-12 NOTE — Telephone Encounter (Signed)
Spoke with pt's daughter, [name] and advised of med recommendations given to pt yesterday. She voiced understanding.

## 2020-11-12 NOTE — Telephone Encounter (Signed)
Lisbeth Ply, daughter of patient [facility]) had some question regarding results. I am assuming she is not the one who spoke to [facility] yesterday.  Please call Adonis Brook (NEW NUMBER FOR TODAY ONLY) [phone]- christie's daughters' phone

## 2020-11-13 DIAGNOSIS — Z9981 Dependence on supplemental oxygen: Secondary | ICD-10-CM | POA: Diagnosis not present

## 2020-11-13 DIAGNOSIS — L97222 Non-pressure chronic ulcer of left calf with fat layer exposed: Secondary | ICD-10-CM | POA: Diagnosis not present

## 2020-11-13 DIAGNOSIS — I38 Endocarditis, valve unspecified: Secondary | ICD-10-CM | POA: Diagnosis not present

## 2020-11-13 DIAGNOSIS — L89612 Pressure ulcer of right heel, stage 2: Secondary | ICD-10-CM | POA: Diagnosis not present

## 2020-11-13 DIAGNOSIS — L97212 Non-pressure chronic ulcer of right calf with fat layer exposed: Secondary | ICD-10-CM | POA: Diagnosis not present

## 2020-11-13 DIAGNOSIS — I130 Hypertensive heart and chronic kidney disease with heart failure and stage 1 through stage 4 chronic kidney disease, or unspecified chronic kidney disease: Secondary | ICD-10-CM | POA: Diagnosis not present

## 2020-11-13 DIAGNOSIS — Z993 Dependence on wheelchair: Secondary | ICD-10-CM | POA: Diagnosis not present

## 2020-11-13 DIAGNOSIS — Z7901 Long term (current) use of anticoagulants: Secondary | ICD-10-CM | POA: Diagnosis not present

## 2020-11-13 DIAGNOSIS — I5032 Chronic diastolic (congestive) heart failure: Secondary | ICD-10-CM | POA: Diagnosis not present

## 2020-11-13 DIAGNOSIS — N189 Chronic kidney disease, unspecified: Secondary | ICD-10-CM | POA: Diagnosis not present

## 2020-11-13 DIAGNOSIS — L89622 Pressure ulcer of left heel, stage 2: Secondary | ICD-10-CM | POA: Diagnosis not present

## 2020-11-13 DIAGNOSIS — Z8781 Personal history of (healed) traumatic fracture: Secondary | ICD-10-CM | POA: Diagnosis not present

## 2020-11-13 DIAGNOSIS — L03115 Cellulitis of right lower limb: Secondary | ICD-10-CM | POA: Diagnosis not present

## 2020-11-13 DIAGNOSIS — Z79891 Long term (current) use of opiate analgesic: Secondary | ICD-10-CM | POA: Diagnosis not present

## 2020-11-13 DIAGNOSIS — I872 Venous insufficiency (chronic) (peripheral): Secondary | ICD-10-CM | POA: Diagnosis not present

## 2020-11-13 DIAGNOSIS — I480 Paroxysmal atrial fibrillation: Secondary | ICD-10-CM | POA: Diagnosis not present

## 2020-11-13 DIAGNOSIS — R600 Localized edema: Secondary | ICD-10-CM | POA: Diagnosis not present

## 2020-11-13 DIAGNOSIS — L89892 Pressure ulcer of other site, stage 2: Secondary | ICD-10-CM | POA: Diagnosis not present

## 2020-11-23 ENCOUNTER — Telehealth: Payer: Self-pay

## 2020-11-23 NOTE — Telephone Encounter (Signed)
Signed and put in box to go up front. Signed:  Crissie Sickles, MD           11/23/2020

## 2020-11-23 NOTE — Telephone Encounter (Signed)
Home health orders received 11/04/20 for [facility] initiation orders: No.  Home health re-certification orders: Yes. Patient last seen by ordering physician for this condition: 10/19/20. Must be less than 90 days for re-certification and less than 30 days prior for initiation. Visit must have been for the condition the orders are being placed.  Patient meets criteria for Physician to sign orders: Yes.        Current med list has been attached: Yes        Orders placed on physicians desk for signature: 11/23/20 (date) If patient does not meet criteria for orders to be signed: pt was called to schedule appt. Appt is scheduled for n/a.   Emilee Hero

## 2020-11-25 ENCOUNTER — Telehealth: Payer: Self-pay | Admitting: Family Medicine

## 2020-11-25 DIAGNOSIS — Z9981 Dependence on supplemental oxygen: Secondary | ICD-10-CM | POA: Diagnosis not present

## 2020-11-25 DIAGNOSIS — I509 Heart failure, unspecified: Secondary | ICD-10-CM | POA: Diagnosis not present

## 2020-11-25 DIAGNOSIS — Z8781 Personal history of (healed) traumatic fracture: Secondary | ICD-10-CM | POA: Diagnosis not present

## 2020-11-25 DIAGNOSIS — I480 Paroxysmal atrial fibrillation: Secondary | ICD-10-CM | POA: Diagnosis not present

## 2020-11-25 DIAGNOSIS — L89622 Pressure ulcer of left heel, stage 2: Secondary | ICD-10-CM | POA: Diagnosis not present

## 2020-11-25 DIAGNOSIS — L03115 Cellulitis of right lower limb: Secondary | ICD-10-CM | POA: Diagnosis not present

## 2020-11-25 DIAGNOSIS — L97222 Non-pressure chronic ulcer of left calf with fat layer exposed: Secondary | ICD-10-CM | POA: Diagnosis not present

## 2020-11-25 DIAGNOSIS — I872 Venous insufficiency (chronic) (peripheral): Secondary | ICD-10-CM | POA: Diagnosis not present

## 2020-11-25 DIAGNOSIS — I130 Hypertensive heart and chronic kidney disease with heart failure and stage 1 through stage 4 chronic kidney disease, or unspecified chronic kidney disease: Secondary | ICD-10-CM | POA: Diagnosis not present

## 2020-11-25 DIAGNOSIS — L97212 Non-pressure chronic ulcer of right calf with fat layer exposed: Secondary | ICD-10-CM | POA: Diagnosis not present

## 2020-11-25 DIAGNOSIS — I38 Endocarditis, valve unspecified: Secondary | ICD-10-CM | POA: Diagnosis not present

## 2020-11-25 DIAGNOSIS — I5032 Chronic diastolic (congestive) heart failure: Secondary | ICD-10-CM | POA: Diagnosis not present

## 2020-11-25 DIAGNOSIS — Z7901 Long term (current) use of anticoagulants: Secondary | ICD-10-CM | POA: Diagnosis not present

## 2020-11-25 DIAGNOSIS — N189 Chronic kidney disease, unspecified: Secondary | ICD-10-CM | POA: Diagnosis not present

## 2020-11-25 DIAGNOSIS — R609 Edema, unspecified: Secondary | ICD-10-CM | POA: Diagnosis not present

## 2020-11-25 DIAGNOSIS — L89612 Pressure ulcer of right heel, stage 2: Secondary | ICD-10-CM | POA: Diagnosis not present

## 2020-11-25 DIAGNOSIS — Z79891 Long term (current) use of opiate analgesic: Secondary | ICD-10-CM | POA: Diagnosis not present

## 2020-11-25 NOTE — Telephone Encounter (Signed)
Amanda Sellers with Amanda Sellers calling, Pts right lower leg has some cellulitis back in it, it has some redness and swelling and is double the the size of the left leg and she said there is a spot where the fluid is trying to get out. She hasn't discharged her and needs verbal orders to see her longer. Call back [phone]. She also wasn't sure how Dr Anitra Lauth would like to handle the situation

## 2020-11-25 NOTE — Telephone Encounter (Signed)
Please review and advise.

## 2020-11-25 NOTE — Telephone Encounter (Signed)
In person office visit. Daughter I talked to suspected pt was not taking her lasix---does [facility] nurse have any similar suspicion?

## 2020-11-25 NOTE — Telephone Encounter (Signed)
Spoke with Colletta Maryland regarding pt and has no suspicions about pt not taking meds. Her left leg is fine. Pt's right leg is hot to the tough and red. R outer ankle has a busted blister and R anterior sheen has a formation of one. [place] nurse did advise pt and pt's daughter [name] that she would need to be seen in person. LM to schedule pt for f/u appt.

## 2020-11-26 ENCOUNTER — Telehealth: Payer: Self-pay

## 2020-11-26 NOTE — Telephone Encounter (Signed)
Spoke with pt's daughter, [name] and advised earliest available appt time is Monday 11/7 at 4pm. She was not sure which sibling would be bringing pt but someone would make sure she was here for appt.

## 2020-11-28 ENCOUNTER — Other Ambulatory Visit (HOSPITAL_COMMUNITY): Payer: Self-pay | Admitting: Nurse Practitioner

## 2020-11-29 ENCOUNTER — Encounter: Payer: Self-pay | Admitting: Family Medicine

## 2020-11-29 ENCOUNTER — Ambulatory Visit (INDEPENDENT_AMBULATORY_CARE_PROVIDER_SITE_OTHER): Payer: PPO | Admitting: Family Medicine

## 2020-11-29 ENCOUNTER — Other Ambulatory Visit: Payer: Self-pay

## 2020-11-29 VITALS — BP 147/72 | HR 60 | Temp 98.0°F

## 2020-11-29 DIAGNOSIS — R609 Edema, unspecified: Secondary | ICD-10-CM

## 2020-11-29 DIAGNOSIS — R58 Hemorrhage, not elsewhere classified: Secondary | ICD-10-CM

## 2020-11-29 DIAGNOSIS — Z7901 Long term (current) use of anticoagulants: Secondary | ICD-10-CM

## 2020-11-29 DIAGNOSIS — N183 Chronic kidney disease, stage 3 unspecified: Secondary | ICD-10-CM | POA: Diagnosis not present

## 2020-11-29 MED ORDER — FLUTICASONE PROPIONATE 0.05 % EX CREA: TOPICAL_CREAM | CUTANEOUS | 2 refills | Status: DC

## 2020-11-29 MED ORDER — FUROSEMIDE 40 MG PO TABS: 40.0000 mg | ORAL_TABLET | Freq: Two times a day (BID) | ORAL | 1 refills | Status: DC

## 2020-11-29 NOTE — Progress Notes (Signed)
OFFICE VISIT  11/29/2020  CC:  Chief Complaint  Patient presents with   Edema    HPI:    Patient is a 87 y.o. female who presents accompanied by her daughter for 5 week f/u LE edema.  INTERIM HX: Last time I saw pt her stasis ulcers and cellulitis were resolved.  Her R heel pressure ulcer was improving.  She has continued with [facility] nursing/wound care.  I continued her on lasix 28m bid.  Renal was function stable, electrolytes normal.  Has some bruising in the medial aspect of right thigh and knee lately.  No pain.  No blood in stool or urine.  She is on Eliquis for her A. fib. Her lower extremity edema is fairly stable on 40 mg of Lasix twice per day.  Her right heel pressure ulcer but continues to improve.  She has a couple of very small skin tears on the right lower leg.  No pain in the leg.  No fevers or malaise.  Past Medical History:  Diagnosis Date   Anemia of chronic disease    Anticoagulation adequate, Eliquis 10/16/2014   Anxiety    clonaz helps this AND her breathing   Cholelithiasis without cholecystitis 07/2020   noted on CT abd/pelv at [facility]   Chronic combined systolic and diastolic congestive heart failure (HSomers 032/20/2542  Complicated by atrial fibrillation; Echo 07/23/14: mild LVH, EF 50-55%, Gr 2 DD.;  Echo May 15, 2019: EF 35 to 40%, GRII DD.  (In setting of CHF exacerbation, class III)-> baseline class II symptoms. 09/2019 EF 55-60%, nl wall motion and LV fxn, grd II DD, mod MR.   Chronic renal insufficiency, stage III (moderate) (HCC)    GFR @ 50   COPD (chronic obstructive pulmonary disease) ([place]    recently noted on Xray, does not have any problems   Diverticulosis    Follicular lymphoma ([place]    Non Hodgkins B cell lymphoma; s/p chemo summer 2016--in remission still as of 09/2018: continue obs q 620mo Hiatal hernia 07/2020   moderate size-[place]     06/2014   HTN (hypertension)     Hypothyroidism    IFG (impaired fasting glucose)    Lip cancer    Dr. ByJanace Hoardxcised this: invasive SCC--no sign of cancer at ENT f/u 10/2015   Microcytic anemia    transfused 3 U total in hosp 07/2014   Obesity (BMI 30-39.9)    Pancytopenia due to antineoplastic chemotherapy ([place]   Persistent atrial fibrillation ([place]   With RVR; elec cardioversion 11/30/14.  On Amio since 10/2014; Dr. NaLenna Gilfordollowing her from pulm standpoint regarding this med.   Positive occult stool blood test 08/08/2014   Endoscopies ok 08/2014   Primary osteoarthritis of both knees 04/2016   Dr. GiGladstone Lighterbone on bone--to get euflexxa to both knees.   Protein calorie malnutrition (HCAlpine2022   Pulmonary hypertension due to left heart disease ([place]CL[place]   RLCP 05/19/2019: EF 35-40%. D1 50%, RI 70%, dLM-LAD 30%.  mPAP 43 mmHg - (62/19 mmHg), PCWP  28 mmHg (V wave 41 mmHg) LVEDP 34 mmHg   Pulmonary metastases (HCC) 07/24/2014   S/p left hip fracture 05/2020   ORIF   Vitamin B 12 deficiency    Vitamin D deficiency    dose increased to 50 K U tab TWICE weekly 04/2017    Past Surgical History:  Procedure Laterality Date  ABDOMINAL HYSTERECTOMY  1978   BONE MARROW BIOPSY  07/24/14   BREAST BIOPSY     CARDIOVERSION N/A 10/16/2014   Procedure: CARDIOVERSION;  Surgeon: Lelon Perla, MD;  [facility];  Service: Cardiovascular;  Laterality: N/A;   CARDIOVERSION N/A 11/30/2014   Procedure: CARDIOVERSION;  Surgeon: Larey Dresser, MD;  Location: [place];  Service: Cardiovascular;  Laterality: N/A;   CATARACT EXTRACTION  2020   OU: Dr. Katy Fitch   COLONOSCOPY  09/23/14   small hemorrhoids, otherwise normal (performed for IDA and heme+ stool)   COLONOSCOPY     INTRAMEDULLARY (IM) NAIL INTERTROCHANTERIC Left 05/25/2020   Procedure: INTRAMEDULLARY (IM) NAIL INTERTROCHANTRIC;  Surgeon: Erle Crocker, MD;  Location: [place];  Service: Orthopedics;  Laterality: Left;   MASS EXCISION Left 07/15/2015   Left lower lip  mass--invasive SCC w/negative margins.  Procedure: EXCISION MASS;  Surgeon: Melissa Montane, MD;  Location: [place];  Service: ENT;  Laterality: Left;  Wedge excision left lower lip mass   NM MYOVIEW LTD  10/29/2014   medium size mild surgery defect in the mid anterior and apical anterior location suggestive of breast attenuation. No reversibility. LOW RISK   PFTs  02/2015   Restriction with diffusion defect: cardiologist referred her to pulm to help interpret PFTs and decide whether she has amiodarone toxicity   RIGHT/LEFT HEART CATH AND CORONARY ANGIOGRAPHY N/A 05/19/2019   Procedure: RIGHT/LEFT HEART CATH AND CORONARY ANGIOGRAPHY;  Surgeon: Troy Sine, MD;  Location: [place] CV LAB;; EF 35-40%. D1 50%, RI 70%, dLM-LAD 30%.  mPAP 43 mmHg - (62/19 mmHg), PCWP  28 mmHg (V wave 41 mmHg) LVEDP 34 mmHg   TEE WITHOUT CARDIOVERSION N/A 10/16/2014   Procedure: TRANSESOPHAGEAL ECHOCARDIOGRAM (TEE);  Surgeon: Lelon Perla, MD;  [facility];  Service: Cardiovascular;  Laterality: N/A;   TRANSTHORACIC ECHOCARDIOGRAM  10/09/2019   EF 55 to 60%.  GRII DD.  No R WMA.  Normal PAP and RAP.  Severe LA dilation.  Mild RA dilation.  Moderate MR (likely related to annular dilation in the setting of severe LAE.  Mild aortic valve sclerosis-no stenosis.   TRANSTHORACIC ECHOCARDIOGRAM  05/15/2019   Acute CHF exacerbation:  EF 35-40%.  Moderately reduced function.  (Previous EF was 55 to 60%) global HK.  GRII DD.  Mild LA dilation.  Normal RV size.   UPPER GI ENDOSCOPY  09/23/2014   small hiatus hernia, nodules in stomach biopsied (chronic active erosive atrophic gastritis with intestinal metaplasia--no dysplasia or malignancy) otherwise normal    Outpatient Medications Prior to Visit  Medication Sig Dispense Refill   acetaminophen (TYLENOL) 500 MG tablet Take 1,000 mg by mouth every 6 (six) hours as needed for moderate pain or headache.     amiodarone (PACERONE) 200 MG tablet TAKE ONE TABLET BY MOUTH  EVERY DAY. (Patient taking differently: Take 200 mg by mouth daily.) 90 tablet 3   amoxicillin-clavulanate (AUGMENTIN) 875-125 MG tablet Take 1 tablet by mouth 2 (two) times daily. 14 tablet 0   apixaban (ELIQUIS) 5 MG TABS tablet Take 1 tablet (5 mg total) by mouth 2 (two) times daily. 56 tablet 0   cyanocobalamin (,VITAMIN B-12,) 1000 MCG/ML injection INJECT 1 ML INTO THE SKIN EVERY 30 DAYS. 3 mL 3   diclofenac Sodium (VOLTAREN) 1 % GEL Apply 2 g topically 4 (four) times daily. Apply to LE joints that are in pain. 300 g 3   ferrous sulfate 325 (65 FE) MG tablet Take 1  tablet (325 mg total) by mouth 2 (two) times daily with a meal. 30 tablet 1   fish oil-omega-3 fatty acids 1000 MG capsule Take 2 g by mouth daily.     HYDROcodone-acetaminophen (NORCO/VICODIN) 5-325 MG tablet 1-2 tabs po q6h prn pain 60 tablet 0   hydrOXYzine (VISTARIL) 25 MG capsule 1-2 tabs po qhs for sleep 60 capsule 3   levothyroxine (SYNTHROID) 100 MCG tablet TAKE 1 TABLET BY MOUTH EVERY DAY BEFORE BREAKFAST (Patient taking differently: Take 100 mcg by mouth daily before breakfast.) 90 tablet 1   OXYGEN Inhale into the lungs. 1Lt at bedtime  as needed     Potassium Chloride ER 20 MEQ TBCR TAKE 1 TABLET BY MOUTH EVERY DAY 90 tablet 1   rosuvastatin (CRESTOR) 20 MG tablet TAKE 1 TABLET BY MOUTH EVERY DAY (Patient taking differently: Take 20 mg by mouth daily.) 90 tablet 3   sacubitril-valsartan (ENTRESTO) 24-26 MG Take 1 tablet by mouth 2 (two) times daily. 60 tablet 11   Vitamin D, Ergocalciferol, (DRISDOL) 1.25 MG (50000 UNIT) CAPS capsule TAKE 1 CAPSULE (50,000 UNITS TOTAL) BY MOUTH 2 (TWO) TIMES A WEEK. (Patient taking differently: Take 50,000 Units by mouth 2 (two) times a week. Sunday and thursday) 24 capsule 3   fluticasone (CUTIVATE) 0.05 % cream Apply to affected area bid prn (Patient taking differently: Apply 1 application topically 2 (two) times daily as needed (itching).) 30 g 2   furosemide (LASIX) 40 MG tablet Take 1  tablet (40 mg total) by mouth daily. Appointment Required For Further Refills [phone] (Patient taking differently: Take 40 mg by mouth 2 (two) times daily. Appointment Required For Further Refills [phone]) 30 tablet 0   No facility-administered medications prior to visit.    Allergies  Allergen Reactions   Aspirin Itching   Calcium Alginate Other (See Comments)    Excessive burning   Morphine     Other reaction(s): Hallucinations   Iodinated Diagnostic Agents Rash and Other (See Comments)    CT Contrast    ROS As per HPI  PE: Vitals with BMI 11/29/2020 10/19/2020 10/06/2020  Height - _0  -  Weight - (No Data) 198 lbs  BMI - - 45.99  Systolic 774 142 395  Diastolic 72 84 50  Pulse 60 86 60   General: Alert and well-appearing.  Lucid thought and speech. Left lower extremity with 1+ pitting edema and some mild hyperpigmentation and skin thickening.  Right lower leg with dense bronzing anteriorly with some pinkish hue over the ankle and shin that is mildly warm.  No erythema or tenderness.  She has a skin tear on the right calf.  Stage I pressure ulcer right heel.  No exudate or surrounding erythema. Ecchymoses and petechiae to cover medial aspect of right thigh and there is some over the anterior aspect of right lower leg.  No induration, no fluctuance.  No palpable hematoma.  LABS:    Chemistry      Component Value Date/Time   NA 140 10/19/2020 1205   NA 141 05/29/2019 1031   NA 143 11/17/2014 0801   K 4.4 10/19/2020 1205   K 3.6 11/17/2014 0801   CL 101 10/19/2020 1205   CO2 30 10/19/2020 1205   CO2 30 (H) 11/17/2014 0801   BUN 22 10/19/2020 1205   BUN 18 05/29/2019 1031   BUN 16.3 11/17/2014 0801   CREATININE 0.86 10/19/2020 1205   CREATININE 0.95 09/10/2020 1451   CREATININE 0.7 11/17/2014 0801  Component Value Date/Time   CALCIUM 9.2 10/19/2020 1205   CALCIUM 8.9 11/17/2014 0801   ALKPHOS 30 (L) 05/30/2020 0139   ALKPHOS 70 11/17/2014 0801   AST  19 09/10/2020 1451   AST 32 11/17/2014 0801   ALT 13 09/10/2020 1451   ALT 30 11/17/2014 0801   BILITOT 0.7 09/10/2020 1451   BILITOT 0.5 05/29/2019 1031   BILITOT 0.55 11/17/2014 0801     Lab Results  Component Value Date   WBC 5.5 07/23/2020   HGB 13.2 07/23/2020   HCT 40.1 07/23/2020   MCV 96.4 07/23/2020   PLT 146 (L) 07/23/2020   Lab Results  Component Value Date   HGBA1C 5.5 02/23/2020    IMPRESSION AND PLAN:  Chronic asymmetric bilateral lower extremity edema.  This is fairly stable on Lasix 40 mg twice a day.  Discussed fluid limitation to 2 L/day. Continue Lasix at this dose and will check electrolytes and renal function today.  She has no sign of infection in the right lower leg.  She does have scattered ecchymoses but no sign of hematoma.  She is on Eliquis so this is fairly expected. Checking CBC today.  She does have a mild amount of venous stasis dermatitis right lower extremity anteriorly.  Recommended fluticasone 0.05% cream twice daily as needed. Continue with home health nursing for lower extremity care/wound care.  An After Visit Summary was printed and given to the patient.  FOLLOW UP: Return in about 3 months (around 03/01/2021) for routine chronic illness f/u.  Signed:  Crissie Sickles, MD           11/29/2020

## 2020-11-29 NOTE — Telephone Encounter (Signed)
Pt seen in office today.

## 2020-11-30 DIAGNOSIS — I872 Venous insufficiency (chronic) (peripheral): Secondary | ICD-10-CM | POA: Diagnosis not present

## 2020-11-30 DIAGNOSIS — I38 Endocarditis, valve unspecified: Secondary | ICD-10-CM | POA: Diagnosis not present

## 2020-11-30 DIAGNOSIS — L97212 Non-pressure chronic ulcer of right calf with fat layer exposed: Secondary | ICD-10-CM | POA: Diagnosis not present

## 2020-11-30 DIAGNOSIS — Z79891 Long term (current) use of opiate analgesic: Secondary | ICD-10-CM | POA: Diagnosis not present

## 2020-11-30 DIAGNOSIS — L03115 Cellulitis of right lower limb: Secondary | ICD-10-CM | POA: Diagnosis not present

## 2020-11-30 DIAGNOSIS — N189 Chronic kidney disease, unspecified: Secondary | ICD-10-CM | POA: Diagnosis not present

## 2020-11-30 DIAGNOSIS — L97222 Non-pressure chronic ulcer of left calf with fat layer exposed: Secondary | ICD-10-CM | POA: Diagnosis not present

## 2020-11-30 DIAGNOSIS — Z7901 Long term (current) use of anticoagulants: Secondary | ICD-10-CM | POA: Diagnosis not present

## 2020-11-30 DIAGNOSIS — Z9981 Dependence on supplemental oxygen: Secondary | ICD-10-CM | POA: Diagnosis not present

## 2020-11-30 DIAGNOSIS — I480 Paroxysmal atrial fibrillation: Secondary | ICD-10-CM | POA: Diagnosis not present

## 2020-11-30 DIAGNOSIS — L89622 Pressure ulcer of left heel, stage 2: Secondary | ICD-10-CM | POA: Diagnosis not present

## 2020-11-30 DIAGNOSIS — Z8781 Personal history of (healed) traumatic fracture: Secondary | ICD-10-CM | POA: Diagnosis not present

## 2020-11-30 DIAGNOSIS — I5032 Chronic diastolic (congestive) heart failure: Secondary | ICD-10-CM | POA: Diagnosis not present

## 2020-11-30 DIAGNOSIS — L89612 Pressure ulcer of right heel, stage 2: Secondary | ICD-10-CM | POA: Diagnosis not present

## 2020-11-30 DIAGNOSIS — I130 Hypertensive heart and chronic kidney disease with heart failure and stage 1 through stage 4 chronic kidney disease, or unspecified chronic kidney disease: Secondary | ICD-10-CM | POA: Diagnosis not present

## 2020-11-30 LAB — CBC WITH DIFFERENTIAL/PLATELET
Basophils Absolute: 0 10*3/uL (ref 0.0–0.1)
Basophils Relative: 0.6 % (ref 0.0–3.0)
Eosinophils Absolute: 0.1 10*3/uL (ref 0.0–0.7)
Eosinophils Relative: 2.4 % (ref 0.0–5.0)
HCT: 37.4 % (ref 36.0–46.0)
Hemoglobin: 12.4 g/dL (ref 12.0–15.0)
Lymphocytes Relative: 18 % (ref 12.0–46.0)
Lymphs Abs: 0.8 10*3/uL (ref 0.7–4.0)
MCHC: 33.2 g/dL (ref 30.0–36.0)
MCV: 93.9 fl (ref 78.0–100.0)
Monocytes Absolute: 0.4 10*3/uL (ref 0.1–1.0)
Monocytes Relative: 9.5 % (ref 3.0–12.0)
Neutro Abs: 3 10*3/uL (ref 1.4–7.7)
Neutrophils Relative %: 69.5 % (ref 43.0–77.0)
Platelets: 149 10*3/uL — ABNORMAL LOW (ref 150.0–400.0)
RBC: 3.98 Mil/uL (ref 3.87–5.11)
RDW: 12.6 % (ref 11.5–15.5)
WBC: 4.4 10*3/uL (ref 4.0–10.5)

## 2020-11-30 LAB — BASIC METABOLIC PANEL
BUN: 20 mg/dL (ref 6–23)
CO2: 34 mEq/L — ABNORMAL HIGH (ref 19–32)
Calcium: 8.9 mg/dL (ref 8.4–10.5)
Chloride: 100 mEq/L (ref 96–112)
Creatinine, Ser: 0.85 mg/dL (ref 0.40–1.20)
GFR: 62.06 mL/min (ref >60.00)
Glucose, Bld: 118 mg/dL — ABNORMAL HIGH (ref 70–99)
Potassium: 3.9 mEq/L (ref 3.5–5.1)
Sodium: 141 mEq/L (ref 135–145)

## 2020-12-10 ENCOUNTER — Encounter: Payer: Self-pay | Admitting: Family Medicine

## 2020-12-10 ENCOUNTER — Emergency Department (HOSPITAL_BASED_OUTPATIENT_CLINIC_OR_DEPARTMENT_OTHER): Payer: PPO

## 2020-12-10 ENCOUNTER — Telehealth: Payer: Self-pay | Admitting: Cardiology

## 2020-12-10 ENCOUNTER — Emergency Department (HOSPITAL_BASED_OUTPATIENT_CLINIC_OR_DEPARTMENT_OTHER)
Admission: EM | Admit: 2020-12-10 | Discharge: 2020-12-10 | Disposition: A | Discharge status: Home / Self Care | Payer: PPO | Attending: Emergency Medicine | Admitting: Emergency Medicine

## 2020-12-10 ENCOUNTER — Encounter (HOSPITAL_BASED_OUTPATIENT_CLINIC_OR_DEPARTMENT_OTHER): Payer: Self-pay

## 2020-12-10 ENCOUNTER — Other Ambulatory Visit: Payer: Self-pay | Admitting: Family Medicine

## 2020-12-10 ENCOUNTER — Other Ambulatory Visit: Payer: Self-pay

## 2020-12-10 DIAGNOSIS — Z79899 Other long term (current) drug therapy: Secondary | ICD-10-CM | POA: Diagnosis not present

## 2020-12-10 DIAGNOSIS — I5032 Chronic diastolic (congestive) heart failure: Secondary | ICD-10-CM | POA: Diagnosis not present

## 2020-12-10 DIAGNOSIS — I872 Venous insufficiency (chronic) (peripheral): Secondary | ICD-10-CM | POA: Diagnosis not present

## 2020-12-10 DIAGNOSIS — J449 Chronic obstructive pulmonary disease, unspecified: Secondary | ICD-10-CM | POA: Diagnosis not present

## 2020-12-10 DIAGNOSIS — N183 Chronic kidney disease, stage 3 unspecified: Secondary | ICD-10-CM | POA: Insufficient documentation

## 2020-12-10 DIAGNOSIS — Z8781 Personal history of (healed) traumatic fracture: Secondary | ICD-10-CM | POA: Diagnosis not present

## 2020-12-10 DIAGNOSIS — L97222 Non-pressure chronic ulcer of left calf with fat layer exposed: Secondary | ICD-10-CM | POA: Diagnosis not present

## 2020-12-10 DIAGNOSIS — I5043 Acute on chronic combined systolic (congestive) and diastolic (congestive) heart failure: Secondary | ICD-10-CM | POA: Insufficient documentation

## 2020-12-10 DIAGNOSIS — L97212 Non-pressure chronic ulcer of right calf with fat layer exposed: Secondary | ICD-10-CM | POA: Diagnosis not present

## 2020-12-10 DIAGNOSIS — L89612 Pressure ulcer of right heel, stage 2: Secondary | ICD-10-CM | POA: Diagnosis not present

## 2020-12-10 DIAGNOSIS — I38 Endocarditis, valve unspecified: Secondary | ICD-10-CM | POA: Diagnosis not present

## 2020-12-10 DIAGNOSIS — R2241 Localized swelling, mass and lump, right lower limb: Secondary | ICD-10-CM | POA: Diagnosis present

## 2020-12-10 DIAGNOSIS — I130 Hypertensive heart and chronic kidney disease with heart failure and stage 1 through stage 4 chronic kidney disease, or unspecified chronic kidney disease: Secondary | ICD-10-CM | POA: Insufficient documentation

## 2020-12-10 DIAGNOSIS — Z79891 Long term (current) use of opiate analgesic: Secondary | ICD-10-CM | POA: Diagnosis not present

## 2020-12-10 DIAGNOSIS — L02415 Cutaneous abscess of right lower limb: Secondary | ICD-10-CM | POA: Diagnosis not present

## 2020-12-10 DIAGNOSIS — I480 Paroxysmal atrial fibrillation: Secondary | ICD-10-CM | POA: Diagnosis not present

## 2020-12-10 DIAGNOSIS — E039 Hypothyroidism, unspecified: Secondary | ICD-10-CM | POA: Diagnosis not present

## 2020-12-10 DIAGNOSIS — I2510 Atherosclerotic heart disease of native coronary artery without angina pectoris: Secondary | ICD-10-CM | POA: Diagnosis not present

## 2020-12-10 DIAGNOSIS — Z9981 Dependence on supplemental oxygen: Secondary | ICD-10-CM | POA: Diagnosis not present

## 2020-12-10 DIAGNOSIS — M7989 Other specified soft tissue disorders: Secondary | ICD-10-CM | POA: Diagnosis not present

## 2020-12-10 DIAGNOSIS — L03115 Cellulitis of right lower limb: Secondary | ICD-10-CM | POA: Diagnosis not present

## 2020-12-10 DIAGNOSIS — L89622 Pressure ulcer of left heel, stage 2: Secondary | ICD-10-CM | POA: Diagnosis not present

## 2020-12-10 DIAGNOSIS — Z7901 Long term (current) use of anticoagulants: Secondary | ICD-10-CM | POA: Insufficient documentation

## 2020-12-10 DIAGNOSIS — E1122 Type 2 diabetes mellitus with diabetic chronic kidney disease: Secondary | ICD-10-CM | POA: Diagnosis not present

## 2020-12-10 DIAGNOSIS — R000 Tachycardia, unspecified: Secondary | ICD-10-CM | POA: Diagnosis not present

## 2020-12-10 DIAGNOSIS — N189 Chronic kidney disease, unspecified: Secondary | ICD-10-CM | POA: Diagnosis not present

## 2020-12-10 LAB — BASIC METABOLIC PANEL
Anion gap: 8 (ref 5–15)
BUN: 23 mg/dL (ref 8–23)
CO2: 30 mmol/L (ref 22–32)
Calcium: 8.9 mg/dL (ref 8.9–10.3)
Chloride: 102 mmol/L (ref 98–111)
Creatinine, Ser: 0.9 mg/dL (ref 0.44–1.00)
GFR, Estimated: >60 mL/min (ref >60)
Glucose, Bld: 123 mg/dL — ABNORMAL HIGH (ref 70–99)
Potassium: 4.5 mmol/L (ref 3.5–5.1)
Sodium: 140 mmol/L (ref 135–145)

## 2020-12-10 LAB — CBC WITH DIFFERENTIAL/PLATELET
Abs Immature Granulocytes: 0.02 10*3/uL (ref 0.00–0.07)
Basophils Absolute: 0 10*3/uL (ref 0.0–0.1)
Basophils Relative: 0 %
Eosinophils Absolute: 0.1 10*3/uL (ref 0.0–0.5)
Eosinophils Relative: 1 %
HCT: 37.6 % (ref 36.0–46.0)
Hemoglobin: 12.3 g/dL (ref 12.0–15.0)
Immature Granulocytes: 0 %
Lymphocytes Relative: 17 %
Lymphs Abs: 1 10*3/uL (ref 0.7–4.0)
MCH: 31.1 pg (ref 26.0–34.0)
MCHC: 32.7 g/dL (ref 30.0–36.0)
MCV: 95.2 fL (ref 80.0–100.0)
Monocytes Absolute: 0.4 10*3/uL (ref 0.1–1.0)
Monocytes Relative: 7 %
Neutro Abs: 4.5 10*3/uL (ref 1.7–7.7)
Neutrophils Relative %: 75 %
Platelets: 128 10*3/uL — ABNORMAL LOW (ref 150–400)
RBC: 3.95 MIL/uL (ref 3.87–5.11)
RDW: 12.2 % (ref 11.5–15.5)
WBC: 6.1 10*3/uL (ref 4.0–10.5)
nRBC: 0 % (ref 0.0–0.2)

## 2020-12-10 MED ORDER — VANCOMYCIN HCL 1250 MG/250ML IV SOLN
1250.0000 mg | INTRAVENOUS | Status: DC
  Filled 2020-12-10: qty 250

## 2020-12-10 MED ORDER — VANCOMYCIN HCL IN DEXTROSE 1-5 GM/200ML-% IV SOLN
1000.0000 mg | INTRAVENOUS | Status: AC
  Administered 2020-12-10 (×2): 1000 mg via INTRAVENOUS
  Filled 2020-12-10 (×2): qty 200

## 2020-12-10 MED ORDER — VANCOMYCIN HCL 1750 MG/350ML IV SOLN
1750.0000 mg | Freq: Once | INTRAVENOUS | Status: DC
  Filled 2020-12-10: qty 350

## 2020-12-10 MED ORDER — LIDOCAINE-EPINEPHRINE (PF) 2 %-1:200000 IJ SOLN
20.0000 mL | Freq: Once | INTRAMUSCULAR | Status: AC
  Administered 2020-12-10: 20 mL
  Filled 2020-12-10: qty 20

## 2020-12-10 MED ORDER — HYDROCODONE-ACETAMINOPHEN 5-325 MG PO TABS: ORAL_TABLET | ORAL | 0 refills | Status: DC

## 2020-12-10 MED ORDER — PIPERACILLIN-TAZOBACTAM 3.375 G IVPB 30 MIN
3.3750 g | Freq: Once | INTRAVENOUS | Status: AC
  Administered 2020-12-10: 3.375 g via INTRAVENOUS
  Filled 2020-12-10: qty 50

## 2020-12-10 MED ORDER — FENTANYL CITRATE PF 50 MCG/ML IJ SOSY
50.0000 ug | PREFILLED_SYRINGE | Freq: Once | INTRAMUSCULAR | Status: AC
  Administered 2020-12-10: 50 ug via NASAL
  Filled 2020-12-10: qty 1

## 2020-12-10 MED ORDER — CLINDAMYCIN HCL 300 MG PO CAPS
300.0000 mg | ORAL_CAPSULE | Freq: Four times a day (QID) | ORAL | 0 refills | Status: AC
Start: 2020-12-10 — End: 2020-12-15

## 2020-12-10 NOTE — ED Notes (Addendum)
First contact with patient. Patient arrived via triage from home with complaints of right calf swelling/cellulitis worse over the last 2 days. Patient has struggled with swelling/cellulitis since her hip surgyer 6 months ago- but today states is much worse. Large hard nodule noted to right mid calf with redness and weeping. Pt is A&OX 4. Respirations even/unlabored. Patient is unable to walk and is WC bound - weeping wounds noted to right foot/heel- dressed by wound care. Patient states this is normal for her foot. Circulation intact. Patient changed into gown and placed on monitor and call light within reach. Patient updated on plan of care. Will continue to monitor patient.   1516: Patient laying quietly in bed. No acute distress noted. Family at bedside. Report given to [facility] for continuation of care. Warm blanket given for comfort.

## 2020-12-10 NOTE — ED Triage Notes (Signed)
Pt arrives in home wheelchair with swelling to RLE states that she has had cellulitis in her right leg with weeping since falling and breaking her hip in may. Was seen by home health RN and advised to come to ED for [place] of RLE. Area is swollen and red in triage, foot was bandaged PTA for drainage.

## 2020-12-10 NOTE — ED Provider Notes (Signed)
[facility] EMERGENCY DEPARTMENT Provider Note   CSN: [id number] Arrival date & time: 12/10/20  1340     History Chief Complaint  Patient presents with   Leg Swelling    Amanda Sellers is a 87 y.o. female.  With past medical history of COPD, congestive heart failure, atrial fibrillation anticoagulated on Eliquis, CKD stage III, Manera hypertension, follicular lymphoma, hypertension, hyperlipidemia presents to the emergency department with leg swelling.  Per the patient and her daughter at bedside, noticed swelling to the right lower extremity on Wednesday that she describes as red and inflamed.  Daughter also endorsed a hard nodule to the right calf.  States that when she returned to see her mother today that it was "much more swollen."  Patient also endorses pain with ambulation or range of motion of the ankle.  Endorses mildly increased shortness of breath.  She does have a history of chronic lung disease however she states that she is up and ambulating she feels slightly more short of breath.  Does not use home O2.  She denies chest pain, syncope, recent long trips, smoking, estrogen use.  She does have a history of malignancy and a more sedentary lifestyle.  She denies fevers, pain to the right knee or right hip, nausea or vomiting.   Patient states that she underwent right hip replacement in May comments and was subsequently in rehab facility for 3 months postoperatively.  During that time she had an episode of cellulitis with redness and ulceration of her foot.  She was treated with antibiotics and eventually states that the right lower extremity return to almost normal appearance.  She states that she has chronic ulcerations on her foot and heel however there was no swelling to the leg.  She endorses the story above that on Wednesday began having lower extremity swelling and pain.  States that it is progressively worsened since then.  HPI     Past Medical History:   Diagnosis Date   Anemia of chronic disease    Anticoagulation adequate, Eliquis 10/16/2014   Anxiety    clonaz helps this AND her breathing   Cholelithiasis without cholecystitis 07/2020   noted on CT abd/pelv at [facility]   Chronic combined systolic and diastolic congestive heart failure ([place]) 29/51/8841   Complicated by atrial fibrillation; Echo 07/23/14: mild LVH, EF 50-55%, Gr 2 DD.;  Echo May 15, 2019: EF 35 to 40%, GRII DD.  (In setting of CHF exacerbation, class III)-> baseline class II symptoms. 09/2019 EF 55-60%, nl wall motion and LV fxn, grd II DD, mod MR.   Chronic renal insufficiency, stage III (moderate) (HCC)    GFR @ 50   COPD (chronic obstructive pulmonary disease) ([place])    recently noted on Xray, does not have any problems   Diverticulosis    Follicular lymphoma ([place])    Non Hodgkins B cell lymphoma; s/p chemo summer 2016--in remission still as of 09/2018: continue obs q 70mo  Hiatal hernia 07/2020   moderate size-[facility]     06/2014   HTN (hypertension)    Hypothyroidism    IFG (impaired fasting glucose)    Lip cancer    Dr. BJanace Hoardexcised this: invasive SCC--no sign of cancer at ENT f/u 10/2015   Microcytic anemia    transfused 3 U total in hosp 07/2014   Obesity (BMI 30-39.9)    Pancytopenia due to antineoplastic chemotherapy ([facility]  Persistent atrial fibrillation (HCC)    With RVR; elec cardioversion 11/30/14.  On Amio since 10/2014; Dr. Lenna Gilford following her from pulm standpoint regarding this med.   Positive occult stool blood test 08/08/2014   Endoscopies ok 08/2014   Primary osteoarthritis of both knees 04/2016   Dr. Gladstone Lighter: bone on bone--to get euflexxa to both knees.   Protein calorie malnutrition ([place]) 2022   Pulmonary hypertension due to left heart disease ([place])    RLCP 05/19/2019: EF 35-40%. D1 50%, RI 70%, dLM-LAD 30%.  mPAP 43 mmHg - (62/19 mmHg), PCWP  28 mmHg (V wave 41 mmHg) LVEDP  34 mmHg   Pulmonary metastases (HCC) 07/24/2014   S/p left hip fracture 05/2020   ORIF   Vitamin B 12 deficiency    Vitamin D deficiency    dose increased to 50 K U tab TWICE weekly 04/2017    Patient Active Problem List   Diagnosis Date Noted   Valvular heart disease 08/22/2020   Peripheral edema 08/22/2020   LBBB (left bundle branch block) 05/25/2020   Fracture, proximal femur, left, closed, initial encounter ([place]) 05/25/2020   Restrictive lung disease 10/01/2019   Hypothyroidism 05/21/2019   Pulmonary hypertension due to left heart disease ([place])    Coronary artery disease involving native coronary artery of native heart without angina pectoris    Dilated cardiomyopathy (HCC)    Stomatitis 08/14/2016   Osteoarthritis 05/08/2016   Pernicious anemia 06/29/2015   Abnormal finding on pulmonary function testing 05/10/2015   History of pernicious anemia 05/10/2015   Other iron deficiency anemias 05/10/2015   On amiodarone therapy 02/11/2015   Anticoagulation adequate, Eliquis 10/16/2014   Paroxysmal atrial fibrillation ([place]); CHA2DS2Vasc 6 - Eliquis; Amio 100 mg daily 10/15/2014   Microcytic anemia 08/09/2014   Positive occult stool blood test 08/08/2014   Symptomatic anemia 08/07/2014   Acute on chronic diastolic CHF (congestive heart failure), NYHA class 3 ([place]) 08/07/2014   Pancytopenia due to antineoplastic chemotherapy ([place]) 90/38/3338   Follicular lymphoma ([place]) 07/29/2014   Lymphadenopathy    Anemia 07/22/2014   Dyspnea 07/22/2014   Chronic diastolic CHF (congestive heart failure) ([place]) 07/22/2014   Hypertension 07/22/2014   Hyperlipidemia 07/22/2014   Osteoporosis 03/01/2006    Past Surgical History:  Procedure Laterality Date   ABDOMINAL HYSTERECTOMY  1978   BONE MARROW BIOPSY  07/24/2014   BREAST BIOPSY     CARDIOVERSION N/A 10/16/2014   Procedure: CARDIOVERSION;  Surgeon: Lelon Perla, MD;  Location: [facility] ENDOSCOPY;  Service: Cardiovascular;  Laterality: N/A;    CARDIOVERSION N/A 11/30/2014   Procedure: CARDIOVERSION;  Surgeon: Larey Dresser, MD;  Location: [facility];  Service: Cardiovascular;  Laterality: N/A;   CATARACT EXTRACTION  2020   OU: Dr. Katy Fitch   COLONOSCOPY  09/23/2014   small hemorrhoids, otherwise normal (performed for IDA and heme+ stool)   COLONOSCOPY     [place]     May 2022   INTRAMEDULLARY (IM) NAIL INTERTROCHANTERIC Left 05/25/2020   Procedure: INTRAMEDULLARY (IM) NAIL INTERTROCHANTRIC;  Surgeon: Erle Crocker, MD;  Location: [facility];  Service: Orthopedics;  Laterality: Left;   MASS EXCISION Left 07/15/2015   Left lower lip mass--invasive SCC w/negative margins.  Procedure: EXCISION MASS;  Surgeon: Melissa Montane, MD;  Location: [facility];  Service: ENT;  Laterality: Left;  Wedge excision left lower lip mass   NM MYOVIEW LTD  10/29/2014   medium size mild surgery defect in the mid anterior  and apical anterior location suggestive of breast attenuation. No reversibility. LOW RISK   PFTs  02/2015   Restriction with diffusion defect: cardiologist referred her to pulm to help interpret PFTs and decide whether she has amiodarone toxicity   RIGHT/LEFT HEART CATH AND CORONARY ANGIOGRAPHY N/A 05/19/2019   Procedure: RIGHT/LEFT HEART CATH AND CORONARY ANGIOGRAPHY;  Surgeon: Troy Sine, MD;  Location: [facility] CV LAB;; EF 35-40%. D1 50%, RI 70%, dLM-LAD 30%.  mPAP 43 mmHg - (62/19 mmHg), PCWP  28 mmHg (V wave 41 mmHg) LVEDP 34 mmHg   TEE WITHOUT CARDIOVERSION N/A 10/16/2014   Procedure: TRANSESOPHAGEAL ECHOCARDIOGRAM (TEE);  Surgeon: Lelon Perla, MD;  Location: [facility] ENDOSCOPY;  Service: Cardiovascular;  Laterality: N/A;   TRANSTHORACIC ECHOCARDIOGRAM  10/09/2019   EF 55 to 60%.  GRII DD.  No R WMA.  Normal PAP and RAP.  Severe LA dilation.  Mild RA dilation.  Moderate MR (likely related to annular dilation in the setting of severe LAE.  Mild aortic valve sclerosis-no stenosis.   TRANSTHORACIC  ECHOCARDIOGRAM  05/15/2019   Acute CHF exacerbation:  EF 35-40%.  Moderately reduced function.  (Previous EF was 55 to 60%) global HK.  GRII DD.  Mild LA dilation.  Normal RV size.   UPPER GI ENDOSCOPY  09/23/2014   small hiatus hernia, nodules in stomach biopsied (chronic active erosive atrophic gastritis with intestinal metaplasia--no dysplasia or malignancy) otherwise normal     OB History   No obstetric history on file.     Family History  Problem Relation Age of Onset   Melanoma Mother    Stomach cancer Father    Lymphoma Sister    Diabetes Daughter     Social History   Tobacco Use   Smoking status: Never   Smokeless tobacco: Never  Vaping Use   Vaping Use: Never used  Substance Use Topics   Alcohol use: No    Alcohol/week: 0.0 standard drinks   Drug use: No    Home Medications Prior to Admission medications   Medication Sig Start Date End Date Taking? Authorizing Provider  acetaminophen (TYLENOL) 500 MG tablet Take 1,000 mg by mouth every 6 (six) hours as needed for moderate pain or headache.    [provider]  amiodarone (PACERONE) 200 MG tablet TAKE ONE TABLET BY MOUTH EVERY DAY. Patient taking differently: Take 200 mg by mouth daily. 03/29/20   Leonie Man, MD  amoxicillin-clavulanate (AUGMENTIN) 875-125 MG tablet Take 1 tablet by mouth 2 (two) times daily. 11/11/20   McGowen, Adrian Blackwater, MD  apixaban (ELIQUIS) 5 MG TABS tablet Take 1 tablet (5 mg total) by mouth 2 (two) times daily. 09/06/20   Sherran Needs, NP  cyanocobalamin (,VITAMIN B-12,) 1000 MCG/ML injection INJECT 1 ML INTO THE SKIN EVERY 30 DAYS. 07/27/20   McGowen, Adrian Blackwater, MD  diclofenac Sodium (VOLTAREN) 1 % GEL Apply 2 g topically 4 (four) times daily. Apply to LE joints that are in pain. 10/06/20   McGowen, Adrian Blackwater, MD  ferrous sulfate 325 (65 FE) MG tablet Take 1 tablet (325 mg total) by mouth 2 (two) times daily with a meal. 07/29/14   Ladell Pier, MD  fish oil-omega-3 fatty acids  1000 MG capsule Take 2 g by mouth daily.    [provider]  fluticasone (CUTIVATE) 0.05 % cream Apply to affected area of lower legs bid prn 11/29/20   McGowen, Adrian Blackwater, MD  furosemide (LASIX) 40 MG tablet Take 1 tablet (  40 mg total) by mouth 2 (two) times daily. Appointment Required For Further Refills [phone] 11/29/20   Tammi Sou, MD  HYDROcodone-acetaminophen (NORCO/VICODIN) 5-325 MG tablet 1-2 tabs po q6h prn pain 11/11/20   McGowen, Adrian Blackwater, MD  hydrOXYzine (VISTARIL) 25 MG capsule 1-2 tabs po qhs for sleep 09/13/20   Tammi Sou, MD  levothyroxine (SYNTHROID) 100 MCG tablet TAKE 1 TABLET BY [place] BREAKFAST Patient taking differently: Take 100 mcg by mouth daily before breakfast. 04/30/20   McGowen, Adrian Blackwater, MD  OXYGEN Inhale into the lungs. 1Lt at bedtime  as needed    [provider]  Potassium Chloride ER 20 MEQ TBCR TAKE 1 TABLET BY MOUTH EVERY DAY 10/21/20   McGowen, Adrian Blackwater, MD  rosuvastatin (CRESTOR) 20 MG tablet TAKE 1 TABLET BY MOUTH EVERY DAY Patient taking differently: Take 20 mg by mouth daily. 09/30/19   Leonie Man, MD  sacubitril-valsartan (ENTRESTO) 24-26 MG Take 1 tablet by mouth 2 (two) times daily. 03/12/20   Leonie Man, MD  Vitamin D, Ergocalciferol, (DRISDOL) 1.25 MG (50000 UNIT) CAPS capsule TAKE 1 CAPSULE (50,000 UNITS TOTAL) BY MOUTH 2 (TWO) TIMES A WEEK. Patient taking differently: Take 50,000 Units by mouth 2 (two) times a week. Sunday and thursday 10/27/19   Tammi Sou, MD    Allergies    Aspirin, Calcium alginate, Morphine, and Iodinated diagnostic agents  Review of Systems   Review of Systems  Constitutional:  Negative for fever.  Respiratory:  Positive for shortness of breath. Negative for cough.   Cardiovascular:  Positive for leg swelling. Negative for chest pain and palpitations.  Gastrointestinal:  Negative for abdominal pain, nausea and vomiting.  Genitourinary:  Negative for dysuria.   Musculoskeletal:  Positive for gait problem and myalgias.  Skin:  Positive for wound.  Neurological:  Negative for dizziness, syncope and light-headedness.  All other systems reviewed and are negative.  Physical Exam Updated Vital Signs BP 131/79 (BP Location: Left Arm)   Pulse (!) 58   Temp 97.8 F (36.6 C) (Oral)   Resp 20   Ht '5\' 6"'  (1.676 m)   Wt 89.8 kg   SpO2 100%   BMI 31.96 kg/m   Physical Exam Vitals and nursing note reviewed.  Constitutional:      General: She is not in acute distress.    Appearance: Normal appearance. She is not toxic-appearing.  HENT:     Head: Normocephalic and atraumatic.     Mouth/Throat:     Mouth: Mucous membranes are moist.     Pharynx: Oropharynx is clear.  Eyes:     General: No scleral icterus.    Extraocular Movements: Extraocular movements intact.     Pupils: Pupils are equal, round, and reactive to light.  Cardiovascular:     Rate and Rhythm: Normal rate. Rhythm irregular.     Pulses: Normal pulses.  Pulmonary:     Effort: Pulmonary effort is normal. No respiratory distress.     Breath sounds: Normal breath sounds.  Abdominal:     General: Bowel sounds are normal. There is no distension.     Palpations: Abdomen is soft.  Musculoskeletal:        General: Swelling and tenderness present.     Cervical back: Normal range of motion and neck supple.     Right lower leg: Tenderness present. 3+ Edema present.     Left lower leg: Normal.     Right ankle: Swelling present.  Tenderness present.     Right foot: Swelling and tenderness present.  Skin:    General: Skin is warm and dry.     Capillary Refill: Capillary refill takes less than 2 seconds.     Findings: Abscess and erythema present.          Comments: Redness, swelling to the right lower extremity from the tibial plateau down to the toes.  There are multiple 0.5 to 1 cm ulcerations on the right dorsal foot and right heel.  There is a focal area of swelling and fluctuance to  the right calf.  Skin is taut.  Tenderness to palpation.    Neurological:     General: No focal deficit present.     Mental Status: She is alert and oriented to person, place, and time. Mental status is at baseline.  Psychiatric:        Mood and Affect: Mood normal.        Behavior: Behavior normal.    ED Results / Procedures / Treatments   Labs (all labs ordered are listed, but only abnormal results are displayed) Labs Reviewed  BASIC METABOLIC PANEL - Abnormal; Notable for the following components:      Result Value   Glucose, Bld 123 (*)    All other components within normal limits  CBC WITH DIFFERENTIAL/PLATELET - Abnormal; Notable for the following components:   Platelets 128 (*)    All other components within normal limits   EKG EKG Interpretation  Date/Time:  Friday December 10 2020 14:44:37 EST Ventricular Rate:  61 PR Interval:  242 QRS Duration: 162 QT Interval:  536 QTC Calculation: 540 R Axis:   15 Text Interpretation: Sinus tachycardia Ventricular bigeminy Prolonged PR interval Left bundle branch block No significant change since last tracing Confirmed by Wandra Arthurs [phone]) on 12/10/2020 3:26:43 PM  Radiology US Venous Img Lower Right (DVT Study)  Result Date: 12/10/2020 CLINICAL DATA:  RIGHT lower extremity swelling. EXAM: RIGHT LOWER EXTREMITY VENOUS DOPPLER ULTRASOUND TECHNIQUE: Gray-scale sonography with compression, as well as color and duplex ultrasound, were performed to evaluate the deep venous system(s) from the level of the common femoral vein through the popliteal and proximal calf veins. COMPARISON:  Pelvis and RIGHT hip XRs, 05/29/2020. FINDINGS: VENOUS Normal compressibility of the common femoral, superficial femoral, and popliteal veins, as well as the visualized calf veins. Visualized portions of profunda femoral vein and great saphenous vein unremarkable. No filling defects to suggest DVT on grayscale or color Doppler imaging. Doppler waveforms  show normal direction of venous flow, normal respiratory plasticity and response to augmentation. Limited views of the contralateral common femoral vein are unremarkable. OTHER No evidence of superficial thrombophlebitis. Prominent RIGHT inguinal lymph node. Within the RIGHT mid calf is a well-circumscribed, heterogeneously hypodense subcutaneous collection without internal Doppler flow, measuring 6.9 x 3.2 x 3.7 cm. See key image Limitations: none IMPRESSION: 1. No evidence of femoropopliteal DVT within the RIGHT lower extremity. 2. RIGHT mid calf subcutaneous collection, measuring up to 7 cm, and favored to represent hematoma. Michaelle Birks, MD [facility] Radiology Electronically Signed   By: Michaelle Birks M.D.   On: 12/10/2020 16:10    Procedures .Marland KitchenIncision and Drainage  Date/Time: 12/10/2020 5:14 PM Performed by: Mickie Hillier, PA-C Authorized by: Mickie Hillier, PA-C   Consent:    Consent obtained:  Verbal   Consent given by:  Patient   Risks, benefits, and alternatives were discussed: yes  Risks discussed:  Bleeding, incomplete drainage, pain, damage to other organs and infection   Alternatives discussed:  No treatment and delayed treatment Universal protocol:    Procedure explained and questions answered to patient or proxy's satisfaction: yes     Relevant documents present and verified: yes     Test results available : yes     Imaging studies available: yes     Required blood products, implants, devices, and special equipment available: yes     Site/side marked: yes     Immediately prior to procedure, a time out was called: yes     Patient identity confirmed:  Verbally with patient Location:    Type:  Abscess   Size:  7cm   Location:  Lower extremity   Lower extremity location:  Leg   Leg location:  R lower leg Pre-procedure details:    Skin preparation:  Povidone-iodine Sedation:    Sedation type:  None Anesthesia:     Anesthesia method:  Local infiltration   Local anesthetic:  Lidocaine 2% WITH epi Procedure type:    Complexity:  Complex Procedure details:    Ultrasound guidance: yes     Needle aspiration: no     Incision types:  Single straight   Incision depth:  Subcutaneous   Wound management:  Probed and deloculated, irrigated with saline and extensive cleaning   Drainage:  Purulent and bloody   Drainage amount:  Copious   Wound treatment:  Wound left open   Packing materials:  1/4 in iodoform gauze   Amount 1/4" iodoform:  8cm Post-procedure details:    Procedure completion:  Tolerated well, no immediate complications   Medications Ordered in ED Medications  piperacillin-tazobactam (ZOSYN) IVPB 3.375 g (0 g Intravenous Stopped 12/10/20 1651)  fentaNYL (SUBLIMAZE) injection 50 mcg (50 mcg Nasal Given 12/10/20 1624)  lidocaine-EPINEPHrine (XYLOCAINE W/EPI) 2 %-1:200000 (PF) injection 20 mL (20 mLs Infiltration Given 12/10/20 1627)  vancomycin (VANCOCIN) IVPB 1000 mg/200 mL premix (0 mg Intravenous Stopped 12/10/20 1919)   ED Course  I have reviewed the triage vital signs and the nursing notes.  Pertinent labs & imaging results that were available during my care of the patient were reviewed by me and considered in my medical decision making (see chart for details).    MDM Rules/Calculators/A&P 87 year old female who presents emergency department with lower extremity swelling.  Initial concern for right lower extremity DVT which was ruled out by ultrasound vascular study. There is a focal area of fluctuance to the right calf.  Bedside ultrasound performed by Dr. Darl Householder which shows likely abscess/hematoma.  Clinical decision making me between attending and myself to proceed with incision and drainage. I performed incision and drainage as noted above in the procedural note of the right calf which produced copious amounts of purulent discharge and blood.  The area was deloculated.  I then packed the  wound after it was evaluated by Dr. Darl Householder.  The patient was given a dose of vancomycin and Zosyn here in the emergency department for ongoing lower extremity cellulitis of the leg and abscess formation. No leukocytosis or fever concerning for sepsis.  Given prescription for clindamycin and strict return precautions in the next 48 hours for reevaluation of her wound.  She and daughter at bedside verbalized understanding of the patient needs to return within 48 hours.  She understands to return sooner she begins having lightheadedness or dizziness, fevers or increased swelling. Final Clinical Impression(s) / ED Diagnoses Final diagnoses:  Abscess of right  lower leg    Rx / DC Orders ED Discharge Orders          Ordered    clindamycin (CLEOCIN) 300 MG capsule  Every 6 hours        12/10/20 1933             Mickie Hillier, PA-C 12/11/20 0134    Drenda Freeze, MD 12/11/20 1451

## 2020-12-10 NOTE — Telephone Encounter (Signed)
Requesting:[place] Contract: N/A for this med UDS: N/A Last Visit: 11/29/20 Next Visit:advised to f/u Feb Last Refill:11/11/20(60,0)  Please Advise. Medication pending. Pt is currently in ED for leg swelling

## 2020-12-10 NOTE — Telephone Encounter (Signed)
Colletta [place] called stating patient is having right leg swelling, she has some blisters.  She went to her primary care last week, he didn't think that is was cellulitis. Colletta Maryland reports patient has pain in the back of her calf, it has a knot, it is red, warm and painful.  She is questioning blood clot.  She is trying to get patient to go to ED.

## 2020-12-10 NOTE — Discharge Instructions (Addendum)
You were here in the emergency department a day for swelling to your right lower leg.  It was determined that this was an abscess and not a DVT/clot in your leg.  While you are here we opened up the abscess and drained it.  There was also a lot of blood that drained out of the area.  We then packed the wound.  *You need to return to the emergency department in 2 days to have your wound rechecked*please return sooner if you begin to have fever, lightheadedness or dizziness.  I am discharging you with antibiotics.  You will take these every 6 hours for the next 5 days.

## 2020-12-10 NOTE — Telephone Encounter (Signed)
Called [place]. She states she is trying to get the pt's daughter's to take her to the emergency department. She just wanted to call and let us know what is going on. Will relay message to Dr. Ellyn Hack.

## 2020-12-10 NOTE — Progress Notes (Signed)
Pharmacy Antibiotic Note  Amanda Sellers is a 87 y.o. female admitted on 12/10/2020 with  leg abscess .  Pharmacy has been consulted for vancomycin dosing.  Patient with a history of COPD, congestive heart failure, atrial fibrillation anticoagulated on Eliquis, CKD stage III, Manera hypertension, follicular lymphoma, hypertension, hyperlipidemia. Patient presenting with leg swelling.  SCr 0.9 - at baseline WBC 6.1; afeb  Plan: Zosyn per MD Vancomycin 2000 mg once then 1250 mg q24hr (eAUC 492) unless change in renal function Trend WBC, Fever, Renal function, & Clinical course F/u cultures, clinical course, WBC, fever De-escalate when able Levels at steady state  Height: 5\' 6"  (167.6 cm) Weight: 89.8 kg (198 lb) IBW/kg (Calculated) : 59.3  Temp (24hrs), Avg:97.8 F (36.6 C), Min:97.8 F (36.6 C), Max:97.8 F (36.6 C)  Recent Labs  Lab 12/10/20 1447  WBC 6.1  CREATININE 0.90    Estimated Creatinine Clearance: 50.6 mL/min (by C-G formula based on SCr of 0.9 mg/dL).    Allergies  Allergen Reactions   Aspirin Itching   Calcium Alginate Other (See Comments)    Excessive burning   Morphine     Other reaction(s): Hallucinations   Iodinated Diagnostic Agents Rash and Other (See Comments)    CT Contrast   Antimicrobials this admission: Zosyn 11/18 >>  vancomycin 11/18 >>   Microbiology results: Pending  Thank you for allowing pharmacy to be a part of this patient's care.  Lorelei Pont, PharmD, BCPS 12/10/2020 4:34 PM ED Clinical Pharmacist -  [phone]

## 2020-12-13 ENCOUNTER — Telehealth: Payer: Self-pay

## 2020-12-13 DIAGNOSIS — I130 Hypertensive heart and chronic kidney disease with heart failure and stage 1 through stage 4 chronic kidney disease, or unspecified chronic kidney disease: Secondary | ICD-10-CM | POA: Diagnosis not present

## 2020-12-13 DIAGNOSIS — I38 Endocarditis, valve unspecified: Secondary | ICD-10-CM | POA: Diagnosis not present

## 2020-12-13 DIAGNOSIS — Z79891 Long term (current) use of opiate analgesic: Secondary | ICD-10-CM | POA: Diagnosis not present

## 2020-12-13 DIAGNOSIS — I5032 Chronic diastolic (congestive) heart failure: Secondary | ICD-10-CM | POA: Diagnosis not present

## 2020-12-13 DIAGNOSIS — I480 Paroxysmal atrial fibrillation: Secondary | ICD-10-CM | POA: Diagnosis not present

## 2020-12-13 DIAGNOSIS — I872 Venous insufficiency (chronic) (peripheral): Secondary | ICD-10-CM | POA: Diagnosis not present

## 2020-12-13 DIAGNOSIS — Z7901 Long term (current) use of anticoagulants: Secondary | ICD-10-CM | POA: Diagnosis not present

## 2020-12-13 DIAGNOSIS — L89622 Pressure ulcer of left heel, stage 2: Secondary | ICD-10-CM | POA: Diagnosis not present

## 2020-12-13 DIAGNOSIS — Z9981 Dependence on supplemental oxygen: Secondary | ICD-10-CM | POA: Diagnosis not present

## 2020-12-13 DIAGNOSIS — L89612 Pressure ulcer of right heel, stage 2: Secondary | ICD-10-CM | POA: Diagnosis not present

## 2020-12-13 DIAGNOSIS — L97222 Non-pressure chronic ulcer of left calf with fat layer exposed: Secondary | ICD-10-CM | POA: Diagnosis not present

## 2020-12-13 DIAGNOSIS — L03115 Cellulitis of right lower limb: Secondary | ICD-10-CM | POA: Diagnosis not present

## 2020-12-13 DIAGNOSIS — Z8781 Personal history of (healed) traumatic fracture: Secondary | ICD-10-CM | POA: Diagnosis not present

## 2020-12-13 DIAGNOSIS — L97212 Non-pressure chronic ulcer of right calf with fat layer exposed: Secondary | ICD-10-CM | POA: Diagnosis not present

## 2020-12-13 DIAGNOSIS — N189 Chronic kidney disease, unspecified: Secondary | ICD-10-CM | POA: Diagnosis not present

## 2020-12-13 NOTE — Telephone Encounter (Signed)
Colletta Maryland called stating that pt went to ED due to wounds on legs. Her wounds were red, warm to touch and painful. Pt had cellulitis and an abscess which was drained and packed. Pt was told to come back to ED to have packing removed. Pt's daughter is a Marine scientist and removed packing herself. Colletta Maryland is calling to get wound care orders to find out how she should be treating wounds at this time. She has done a wet to dry dressing at the moment until she has received orders due to having to see other patients.   She can be reached at [phone]. Okay to leave VM

## 2020-12-13 NOTE — Telephone Encounter (Signed)
Wound size 2cm x 1.5 0.6 depth

## 2020-12-13 NOTE — Telephone Encounter (Signed)
Wet to dry is good.  Is the wound deep enough that packing is needed?  Can she send picture via Mychart?

## 2020-12-13 NOTE — Telephone Encounter (Signed)
Ok, packing not needed.

## 2020-12-14 NOTE — Telephone Encounter (Signed)
Amanda Sellers advised of recommendations.

## 2020-12-25 DIAGNOSIS — R609 Edema, unspecified: Secondary | ICD-10-CM | POA: Diagnosis not present

## 2020-12-25 DIAGNOSIS — I509 Heart failure, unspecified: Secondary | ICD-10-CM | POA: Diagnosis not present

## 2020-12-27 DIAGNOSIS — I130 Hypertensive heart and chronic kidney disease with heart failure and stage 1 through stage 4 chronic kidney disease, or unspecified chronic kidney disease: Secondary | ICD-10-CM | POA: Diagnosis not present

## 2020-12-27 DIAGNOSIS — L89622 Pressure ulcer of left heel, stage 2: Secondary | ICD-10-CM | POA: Diagnosis not present

## 2020-12-27 DIAGNOSIS — Z79891 Long term (current) use of opiate analgesic: Secondary | ICD-10-CM | POA: Diagnosis not present

## 2020-12-27 DIAGNOSIS — Z7901 Long term (current) use of anticoagulants: Secondary | ICD-10-CM | POA: Diagnosis not present

## 2020-12-27 DIAGNOSIS — L03115 Cellulitis of right lower limb: Secondary | ICD-10-CM | POA: Diagnosis not present

## 2020-12-27 DIAGNOSIS — L97212 Non-pressure chronic ulcer of right calf with fat layer exposed: Secondary | ICD-10-CM | POA: Diagnosis not present

## 2020-12-27 DIAGNOSIS — L97222 Non-pressure chronic ulcer of left calf with fat layer exposed: Secondary | ICD-10-CM | POA: Diagnosis not present

## 2020-12-27 DIAGNOSIS — I5032 Chronic diastolic (congestive) heart failure: Secondary | ICD-10-CM | POA: Diagnosis not present

## 2020-12-27 DIAGNOSIS — I872 Venous insufficiency (chronic) (peripheral): Secondary | ICD-10-CM | POA: Diagnosis not present

## 2020-12-27 DIAGNOSIS — Z8781 Personal history of (healed) traumatic fracture: Secondary | ICD-10-CM | POA: Diagnosis not present

## 2020-12-27 DIAGNOSIS — I38 Endocarditis, valve unspecified: Secondary | ICD-10-CM | POA: Diagnosis not present

## 2020-12-27 DIAGNOSIS — N189 Chronic kidney disease, unspecified: Secondary | ICD-10-CM | POA: Diagnosis not present

## 2020-12-27 DIAGNOSIS — L89612 Pressure ulcer of right heel, stage 2: Secondary | ICD-10-CM | POA: Diagnosis not present

## 2020-12-27 DIAGNOSIS — Z9981 Dependence on supplemental oxygen: Secondary | ICD-10-CM | POA: Diagnosis not present

## 2020-12-27 DIAGNOSIS — I480 Paroxysmal atrial fibrillation: Secondary | ICD-10-CM | POA: Diagnosis not present

## 2020-12-30 ENCOUNTER — Encounter: Payer: Self-pay | Admitting: Family Medicine

## 2020-12-30 ENCOUNTER — Other Ambulatory Visit: Payer: Self-pay

## 2020-12-30 ENCOUNTER — Ambulatory Visit (INDEPENDENT_AMBULATORY_CARE_PROVIDER_SITE_OTHER): Payer: PPO | Admitting: Family Medicine

## 2020-12-30 VITALS — BP 138/68 | HR 62 | Temp 97.3°F | Ht 66.0 in

## 2020-12-30 DIAGNOSIS — I890 Lymphedema, not elsewhere classified: Secondary | ICD-10-CM

## 2020-12-30 DIAGNOSIS — S81801A Unspecified open wound, right lower leg, initial encounter: Secondary | ICD-10-CM | POA: Diagnosis not present

## 2020-12-30 MED ORDER — CLINDAMYCIN HCL 300 MG PO CAPS: 300.0000 mg | ORAL_CAPSULE | Freq: Three times a day (TID) | ORAL | 0 refills | Status: DC

## 2020-12-30 NOTE — Patient Instructions (Signed)
Increase your lasix to 60mg  (1 and 1/2 of the 40mg  tabs) TWICE a day for the next 3d.

## 2020-12-30 NOTE — Progress Notes (Signed)
OFFICE VISIT  12/30/2020  CC:  Chief Complaint  Patient presents with   Follow-up    R abscess; went to ER on 12/10/20. Pt c/o burning and it was recommended she go on an antibiotic.     HPI:    Patient is a 87 y.o. female with chronic asymmetric bilat LE edema who presents accompanied by her daughter Lynelle Smoke for f/u R calf abscess. [facility]20 for worsening R LL swelling and redness/pain, venous doppler showed NO DVT.  Bedside u/s showed abscess and this was I&D'd, wound packed, and clindamycin rx'd.  BMET and CBC normal that day.  INTERIM HX: She says her leg feels much better after the incision and drainage. If feels a little firm in the area and is still draining some.  She has her usual significant lower extremity lymphedema, right greater than left. No fevers or chills or malaise.  She took the clindamycin without problem.  Past Medical History:  Diagnosis Date   Anemia of chronic disease    Anticoagulation adequate, Eliquis 10/16/2014   Anxiety    clonaz helps this AND her breathing   Cholelithiasis without cholecystitis 07/2020   noted on CT abd/pelv at [facility]   Chronic combined systolic and diastolic congestive heart failure ([place]) 16/07/3708   Complicated by atrial fibrillation; Echo 07/23/14: mild LVH, EF 50-55%, Gr 2 DD.;  Echo May 15, 2019: EF 35 to 40%, GRII DD.  (In setting of CHF exacerbation, class III)-> baseline class II symptoms. 09/2019 EF 55-60%, nl wall motion and LV fxn, grd II DD, mod MR.   Chronic renal insufficiency, stage III (moderate) (HCC)    GFR @ 50   COPD (chronic obstructive pulmonary disease) ([place])    recently noted on Xray, does not have any problems   Diverticulosis    Follicular lymphoma ([place])    Non Hodgkins B cell lymphoma; s/p chemo summer 2016--in remission still as of 09/2018: continue obs q 27mo  Hiatal hernia 07/2020   moderate size-[place] [facility]      06/2014   HTN (hypertension)    Hypothyroidism    IFG (impaired fasting glucose)    Lip cancer    Dr. BJanace Hoardexcised this: invasive SCC--no sign of cancer at ENT f/u 10/2015   Microcytic anemia    transfused 3 U total in hosp 07/2014   Obesity (BMI 30-39.9)    Pancytopenia due to antineoplastic chemotherapy ([place]    Persistent atrial fibrillation ([facility]    With RVR; elec cardioversion 11/30/14.  On Amio since 10/2014; Dr. NLenna Gilfordfollowing her from pulm standpoint regarding this med.   Positive occult stool blood test 08/08/2014   Endoscopies ok 08/2014   Primary osteoarthritis of both knees 04/2016   Dr. GGladstone Lighter bone on bone--to get euflexxa to both knees.   Protein calorie malnutrition ([place] 2022   Pulmonary hypertension due to left heart disease ([place]    RLCP 05/19/2019: EF 35-40%. D1 50%, RI 70%, dLM-LAD 30%.  mPAP 43 mmHg - (62/19 mmHg), PCWP  28 mmHg (V wave 41 mmHg) LVEDP 34 mmHg   Pulmonary metastases (HCC) 07/24/2014   S/p left hip fracture 05/2020   ORIF   Vitamin B 12 deficiency    Vitamin D deficiency    dose increased to 50 K U tab TWICE weekly 04/2017    Past Surgical History:  Procedure Laterality Date   ABDOMINAL HYSTERECTOMY  1978  BONE MARROW BIOPSY  07/24/2014   BREAST BIOPSY     CARDIOVERSION N/A 10/16/2014   Procedure: CARDIOVERSION;  Surgeon: Lelon Perla, MD;  Location: [facility] ENDOSCOPY;  Service: Cardiovascular;  Laterality: N/A;   CARDIOVERSION N/A 11/30/2014   Procedure: CARDIOVERSION;  Surgeon: Larey Dresser, MD;  Location: [place];  Service: Cardiovascular;  Laterality: N/A;   CATARACT EXTRACTION  2020   OU: Dr. Katy Fitch   COLONOSCOPY  09/23/2014   small hemorrhoids, otherwise normal (performed for IDA and heme+ stool)   COLONOSCOPY     [place]     May 2022   INTRAMEDULLARY (IM) NAIL INTERTROCHANTERIC Left 05/25/2020   Procedure: INTRAMEDULLARY (IM) NAIL INTERTROCHANTRIC;  Surgeon: Erle Crocker, MD;   Location: [place];  Service: Orthopedics;  Laterality: Left;   MASS EXCISION Left 07/15/2015   Left lower lip mass--invasive SCC w/negative margins.  Procedure: EXCISION MASS;  Surgeon: Melissa Montane, MD;  Location: [place];  Service: ENT;  Laterality: Left;  Wedge excision left lower lip mass   NM MYOVIEW LTD  10/29/2014   medium size mild surgery defect in the mid anterior and apical anterior location suggestive of breast attenuation. No reversibility. LOW RISK   PFTs  02/2015   Restriction with diffusion defect: cardiologist referred her to pulm to help interpret PFTs and decide whether she has amiodarone toxicity   RIGHT/LEFT HEART CATH AND CORONARY ANGIOGRAPHY N/A 05/19/2019   Procedure: RIGHT/LEFT HEART CATH AND CORONARY ANGIOGRAPHY;  Surgeon: Troy Sine, MD;  Location: [place] CV LAB;; EF 35-40%. D1 50%, RI 70%, dLM-LAD 30%.  mPAP 43 mmHg - (62/19 mmHg), PCWP  28 mmHg (V wave 41 mmHg) LVEDP 34 mmHg   TEE WITHOUT CARDIOVERSION N/A 10/16/2014   Procedure: TRANSESOPHAGEAL ECHOCARDIOGRAM (TEE);  Surgeon: Lelon Perla, MD;  Location: [facility] ENDOSCOPY;  Service: Cardiovascular;  Laterality: N/A;   TRANSTHORACIC ECHOCARDIOGRAM  10/09/2019   EF 55 to 60%.  GRII DD.  No R WMA.  Normal PAP and RAP.  Severe LA dilation.  Mild RA dilation.  Moderate MR (likely related to annular dilation in the setting of severe LAE.  Mild aortic valve sclerosis-no stenosis.   TRANSTHORACIC ECHOCARDIOGRAM  05/15/2019   Acute CHF exacerbation:  EF 35-40%.  Moderately reduced function.  (Previous EF was 55 to 60%) global HK.  GRII DD.  Mild LA dilation.  Normal RV size.   UPPER GI ENDOSCOPY  09/23/2014   small hiatus hernia, nodules in stomach biopsied (chronic active erosive atrophic gastritis with intestinal metaplasia--no dysplasia or malignancy) otherwise normal    Outpatient Medications Prior to Visit  Medication Sig Dispense Refill   acetaminophen (TYLENOL) 500 MG tablet Take 1,000 mg by mouth every 6 (six)  hours as needed for moderate pain or headache.     amiodarone (PACERONE) 200 MG tablet TAKE ONE TABLET BY MOUTH EVERY DAY. (Patient taking differently: Take 200 mg by mouth daily.) 90 tablet 3   apixaban (ELIQUIS) 5 MG TABS tablet Take 1 tablet (5 mg total) by mouth 2 (two) times daily. 56 tablet 0   cyanocobalamin (,VITAMIN B-12,) 1000 MCG/ML injection INJECT 1 ML INTO THE SKIN EVERY 30 DAYS. 3 mL 3   diclofenac Sodium (VOLTAREN) 1 % GEL Apply 2 g topically 4 (four) times daily. Apply to LE joints that are in pain. 300 g 3   ferrous sulfate 325 (65 FE) MG tablet Take 1 tablet (325 mg total) by mouth 2 (two)  times daily with a meal. 30 tablet 1   fish oil-omega-3 fatty acids 1000 MG capsule Take 2 g by mouth daily.     fluticasone (CUTIVATE) 0.05 % cream Apply to affected area of lower legs bid prn 60 g 2   furosemide (LASIX) 40 MG tablet Take 1 tablet (40 mg total) by mouth 2 (two) times daily. Appointment Required For Further Refills [phone] 180 tablet 1   HYDROcodone-acetaminophen (NORCO/VICODIN) 5-325 MG tablet 1-2 tabs po q6h prn pain 60 tablet 0   hydrOXYzine (VISTARIL) 25 MG capsule 1-2 tabs po qhs for sleep 60 capsule 3   levothyroxine (SYNTHROID) 100 MCG tablet TAKE 1 TABLET BY MOUTH EVERY DAY BEFORE BREAKFAST (Patient taking differently: Take 100 mcg by mouth daily before breakfast.) 90 tablet 1   OXYGEN Inhale into the lungs. 1Lt at bedtime  as needed     Potassium Chloride ER 20 MEQ TBCR TAKE 1 TABLET BY MOUTH EVERY DAY 90 tablet 1   rosuvastatin (CRESTOR) 20 MG tablet TAKE 1 TABLET BY MOUTH EVERY DAY (Patient taking differently: Take 20 mg by mouth daily.) 90 tablet 3   sacubitril-valsartan (ENTRESTO) 24-26 MG Take 1 tablet by mouth 2 (two) times daily. 60 tablet 11   Vitamin D, Ergocalciferol, (DRISDOL) 1.25 MG (50000 UNIT) CAPS capsule TAKE 1 CAPSULE (50,000 UNITS TOTAL) BY MOUTH 2 (TWO) TIMES A WEEK. (Patient taking differently: Take 50,000 Units by mouth 2 (two) times a week.  Sunday and thursday) 24 capsule 3   No facility-administered medications prior to visit.    Allergies  Allergen Reactions   Aspirin Itching   Calcium Alginate Other (See Comments)    Excessive burning   Morphine     Other reaction(s): Hallucinations   Iodinated Diagnostic Agents Rash and Other (See Comments)    CT Contrast    ROS As per HPI  PE: Vitals with BMI 12/30/2020 12/10/2020 12/10/2020  Height 5' 6" - -  Weight (No Data) - -  BMI - - -  Systolic [phone]  Diastolic 68 50 51  Pulse 62 59 62     Gen: Alert, well appearing.  Patient is oriented to person, place, time, and situation. AFFECT: pleasant, lucid thought and speech. Legs: Bilateral lower extremity nonpitting and pitting edema, right significantly greater than left. Right calf with approximately 2 cm oval superficial ulceration with just subcutaneous tissue showing.  She has some serous drainage from this and this is expressed more with pressure around, more consistent with edematous fluid rather than any residual abscess no.  No fluctuance.  No subcu mass or nodularity.  The area around the ulceration is a bit indurated.  No streaking or significant erythema to suggest surrounding cellulitis.  LABS:   Lab Results  Component Value Date   TSH 2.83 02/23/2020      Chemistry      Component Value Date/Time   NA 140 12/10/2020 1447   NA 141 05/29/2019 1031   NA 143 11/17/2014 0801   K 4.5 12/10/2020 1447   K 3.6 11/17/2014 0801   CL 102 12/10/2020 1447   CO2 30 12/10/2020 1447   CO2 30 (H) 11/17/2014 0801   BUN 23 12/10/2020 1447   BUN 18 05/29/2019 1031   BUN 16.3 11/17/2014 0801   CREATININE 0.90 12/10/2020 1447   CREATININE 0.95 09/10/2020 1451   CREATININE 0.7 11/17/2014 0801      Component Value Date/Time   CALCIUM 8.9 12/10/2020 1447   CALCIUM 8.9 11/17/2014 0801  ALKPHOS 30 (L) 05/30/2020 0139   ALKPHOS 70 11/17/2014 0801   AST 19 09/10/2020 1451   AST 32 11/17/2014 0801   ALT 13  09/10/2020 1451   ALT 30 11/17/2014 0801   BILITOT 0.7 09/10/2020 1451   BILITOT 0.5 05/29/2019 1031   BILITOT 0.55 11/17/2014 0801     Lab Results  Component Value Date   WBC 6.1 12/10/2020   HGB 12.3 12/10/2020   HCT 37.6 12/10/2020   MCV 95.2 12/10/2020   PLT 128 (L) 12/10/2020   IMPRESSION AND PLAN:  #1.  Right calf abscess, status post incision and drainage and antibiotic treatment.  This is certainly a complication of her chronic edema.  Fairly low suspicion that there is any residual per official infection there, certainly no remaining abscess.  Sent wound culture today. Will treat with clindamycin 300 3 times daily x10 days and adjust as needed based on culture results.  Additionally, will increase her Lasix to 60 mg twice a day for the next 3 days.  She will continue with elevation of legs at home as well.  An After Visit Summary was printed and given to the patient.  FOLLOW UP: Return for 4d f/u R leg wound/swelling (late afternoon monday 12/12. Has appt 03/02/21 with me  Signed:  Crissie Sickles, MD           12/30/2020

## 2020-12-31 ENCOUNTER — Telehealth: Payer: Self-pay

## 2020-12-31 NOTE — Telephone Encounter (Signed)
Signed and put in box to go up front. Signed:  Crissie Sickles, MD           12/31/2020

## 2020-12-31 NOTE — Telephone Encounter (Signed)
Home health orders received 12/31/20 for [facility] initiation orders: Yes.  Home health re-certification orders: No. Patient last seen by ordering physician for this condition: 12/30/20. Must be less than 90 days for re-certification and less than 30 days prior for initiation. Visit must have been for the condition the orders are being placed.  Patient meets criteria for Physician to sign orders: Yes.        Current med list has been attached: No        Orders placed on physicians desk for signature: 12/31/20 (date) If patient does not meet criteria for orders to be signed: pt was called to schedule appt. Appt is scheduled for 01/03/21.   Emilee Hero

## 2021-01-02 LAB — WOUND CULTURE
MICRO NUMBER:: 12735285
SPECIMEN QUALITY:: ADEQUATE

## 2021-01-03 ENCOUNTER — Ambulatory Visit (INDEPENDENT_AMBULATORY_CARE_PROVIDER_SITE_OTHER): Payer: PPO | Admitting: Family Medicine

## 2021-01-03 ENCOUNTER — Other Ambulatory Visit: Payer: Self-pay | Admitting: Cardiology

## 2021-01-03 ENCOUNTER — Telehealth: Payer: Self-pay

## 2021-01-03 ENCOUNTER — Encounter: Payer: Self-pay | Admitting: Family Medicine

## 2021-01-03 ENCOUNTER — Other Ambulatory Visit: Payer: Self-pay

## 2021-01-03 VITALS — BP 147/58 | HR 64 | Temp 97.6°F | Ht 66.0 in

## 2021-01-03 DIAGNOSIS — L03115 Cellulitis of right lower limb: Secondary | ICD-10-CM | POA: Diagnosis not present

## 2021-01-03 DIAGNOSIS — R600 Localized edema: Secondary | ICD-10-CM | POA: Diagnosis not present

## 2021-01-03 DIAGNOSIS — R609 Edema, unspecified: Secondary | ICD-10-CM | POA: Diagnosis not present

## 2021-01-03 MED ORDER — CEFDINIR 300 MG PO CAPS: 300.0000 mg | ORAL_CAPSULE | Freq: Two times a day (BID) | ORAL | 0 refills | Status: AC

## 2021-01-03 NOTE — Telephone Encounter (Signed)
-----   Message from Tammi Sou, MD sent at 01/03/2021  8:13 AM EST ----- Wound culture grew a bacteria that would be treated better by a different antibiotic than clindamycin. Pls eRx cefdinir 300 mg, 1 tab po bid x 7d, #14, no RF. Stop clindamycin.

## 2021-01-03 NOTE — Progress Notes (Signed)
OFFICE VISIT  01/03/2021  CC:  Chief Complaint  Patient presents with   Follow-up    R leg wound/swelling    HPI:    Patient is a 87 y.o. female who presents accompanied by her daughter for 4d f/u leg swelling and erythema. A/P as of last visit: "#1.  Right calf abscess, status post incision and drainage and antibiotic treatment.  This is certainly a complication of her chronic edema.  Fairly low suspicion that there is any residual per official infection there, certainly no remaining abscess.  Sent wound culture today. Will treat with clindamycin 300 3 times daily x10 days and adjust as needed based on culture results.  Additionally, will increase her Lasix to 60 mg twice a day for the next 3 days.  She will continue with elevation of legs at home as well."  INTERIM HX: Wound culture last visit grew serratia marcescens, resistant to amox and cefazolin.  No sensitivity testing for clindamycin or doxy was done. I sent in rx for omnicef to replace clinda earlier today.  Her leg swelling is improved some.  After last visit she says later that night some redness return to the right lower leg but then last couple days things have been much better.  No fever or malaise.  She has good days and bad days regarding energy level and ability to walk much.   Past Medical History:  Diagnosis Date   Anemia of chronic disease    Anticoagulation adequate, Eliquis 10/16/2014   Anxiety    clonaz helps this AND her breathing   Cholelithiasis without cholecystitis 07/2020   noted on CT abd/pelv at [facility]   Chronic combined systolic and diastolic congestive heart failure ([place]) 41/28/7867   Complicated by atrial fibrillation; Echo 07/23/14: mild LVH, EF 50-55%, Gr 2 DD.;  Echo May 15, 2019: EF 35 to 40%, GRII DD.  (In setting of CHF exacerbation, class III)-> baseline class II symptoms. 09/2019 EF 55-60%, nl wall motion and LV fxn, grd II DD, mod MR.   Chronic renal insufficiency, stage III  (moderate) (HCC)    GFR @ 50   COPD (chronic obstructive pulmonary disease) ([place])    recently noted on Xray, does not have any problems   Diverticulosis    Follicular lymphoma ([place])    Non Hodgkins B cell lymphoma; s/p chemo summer 2016--in remission still as of 09/2018: continue obs q 34mo  Hiatal hernia 07/2020   moderate size-[place]     06/2014   HTN (hypertension)    Hypothyroidism    IFG (impaired fasting glucose)    Lip cancer    Dr. BJanace Hoardexcised this: invasive SCC--no sign of cancer at ENT f/u 10/2015   Microcytic anemia    transfused 3 U total in hosp 07/2014   Obesity (BMI 30-39.9)    Pancytopenia due to antineoplastic chemotherapy ([facility]    Persistent atrial fibrillation ([place]    With RVR; elec cardioversion 11/30/14.  On Amio since 10/2014; Dr. NLenna Gilfordfollowing her from pulm standpoint regarding this med.   Positive occult stool blood test 08/08/2014   Endoscopies ok 08/2014   Primary osteoarthritis of both knees 04/2016   Dr. GGladstone Lighter bone on bone--to get euflexxa to both knees.   [place] 20[place]   Pulmonary hypertension due to left heart disease ([place]    RLCP 05/19/2019: EF 35-40%. D1 50%, RI 70%, dLM-LAD 30%.  mPAP 43 mmHg - (  62/19 mmHg), PCWP  28 mmHg (V wave 41 mmHg) LVEDP 34 mmHg   Pulmonary metastases (HCC) 07/24/2014   S/p left hip fracture 05/2020   ORIF   Vitamin B 12 deficiency    Vitamin D deficiency    dose increased to 50 K U tab TWICE weekly 04/2017    Past Surgical History:  Procedure Laterality Date   ABDOMINAL HYSTERECTOMY  1978   BONE MARROW BIOPSY  07/24/2014   BREAST BIOPSY     CARDIOVERSION N/A 10/16/2014   Procedure: CARDIOVERSION;  Surgeon: Lelon Perla, MD;  Location: [facility] ENDOSCOPY;  Service: Cardiovascular;  Laterality: N/A;   CARDIOVERSION N/A 11/30/2014   Procedure: CARDIOVERSION;  Surgeon: Larey Dresser, MD;  Location: [place];  Service:  Cardiovascular;  Laterality: N/A;   CATARACT EXTRACTION  2020   OU: Dr. Katy Fitch   COLONOSCOPY  09/23/2014   small hemorrhoids, otherwise normal (performed for IDA and heme+ stool)   COLONOSCOPY     [place]     May 2022   INTRAMEDULLARY (IM) NAIL INTERTROCHANTERIC Left 05/25/2020   Procedure: INTRAMEDULLARY (IM) NAIL INTERTROCHANTRIC;  Surgeon: Erle Crocker, MD;  Location: [place];  Service: Orthopedics;  Laterality: Left;   MASS EXCISION Left 07/15/2015   Left lower lip mass--invasive SCC w/negative margins.  Procedure: EXCISION MASS;  Surgeon: Melissa Montane, MD;  Location: [place];  Service: ENT;  Laterality: Left;  Wedge excision left lower lip mass   NM MYOVIEW LTD  10/29/2014   medium size mild surgery defect in the mid anterior and apical anterior location suggestive of breast attenuation. No reversibility. LOW RISK   PFTs  02/2015   Restriction with diffusion defect: cardiologist referred her to pulm to help interpret PFTs and decide whether she has amiodarone toxicity   RIGHT/LEFT HEART CATH AND CORONARY ANGIOGRAPHY N/A 05/19/2019   Procedure: RIGHT/LEFT HEART CATH AND CORONARY ANGIOGRAPHY;  Surgeon: Troy Sine, MD;  Location: [place] CV LAB;; EF 35-40%. D1 50%, RI 70%, dLM-LAD 30%.  mPAP 43 mmHg - (62/19 mmHg), PCWP  28 mmHg (V wave 41 mmHg) LVEDP 34 mmHg   TEE WITHOUT CARDIOVERSION N/A 10/16/2014   Procedure: TRANSESOPHAGEAL ECHOCARDIOGRAM (TEE);  Surgeon: Lelon Perla, MD;  Location[facility] ENDOSCOPY;  Service: Cardiovascular;  Laterality: N/A;   TRANSTHORACIC ECHOCARDIOGRAM  10/09/2019   EF 55 to 60%.  GRII DD.  No R WMA.  Normal PAP and RAP.  Severe LA dilation.  Mild RA dilation.  Moderate MR (likely related to annular dilation in the setting of severe LAE.  Mild aortic valve sclerosis-no stenosis.   TRANSTHORACIC ECHOCARDIOGRAM  05/15/2019   Acute CHF exacerbation:  EF 35-40%.  Moderately reduced function.  (Previous EF was 55 to 60%)  global HK.  GRII DD.  Mild LA dilation.  Normal RV size.   UPPER GI ENDOSCOPY  09/23/2014   small hiatus hernia, nodules in stomach biopsied (chronic active erosive atrophic gastritis with intestinal metaplasia--no dysplasia or malignancy) otherwise normal    Outpatient Medications Prior to Visit  Medication Sig Dispense Refill   acetaminophen (TYLENOL) 500 MG tablet Take 1,000 mg by mouth every 6 (six) hours as needed for moderate pain or headache.     amiodarone (PACERONE) 200 MG tablet TAKE ONE TABLET BY MOUTH EVERY DAY. (Patient taking differently: Take 200 mg by mouth daily.) 90 tablet 3   apixaban (ELIQUIS) 5 MG TABS tablet Take 1 tablet (5 mg total) by mouth 2 (  two) times daily. 56 tablet 0   cefdinir (OMNICEF) 300 MG capsule Take 1 capsule (300 mg total) by mouth 2 (two) times daily for 7 days. 14 capsule 0   cyanocobalamin (,VITAMIN B-12,) 1000 MCG/ML injection INJECT 1 ML INTO THE SKIN EVERY 30 DAYS. 3 mL 3   diclofenac Sodium (VOLTAREN) 1 % GEL Apply 2 g topically 4 (four) times daily. Apply to LE joints that are in pain. 300 g 3   ferrous sulfate 325 (65 FE) MG tablet Take 1 tablet (325 mg total) by mouth 2 (two) times daily with a meal. 30 tablet 1   fish oil-omega-3 fatty acids 1000 MG capsule Take 2 g by mouth daily.     fluticasone (CUTIVATE) 0.05 % cream Apply to affected area of lower legs bid prn 60 g 2   furosemide (LASIX) 40 MG tablet Take 1 tablet (40 mg total) by mouth 2 (two) times daily. Appointment Required For Further Refills [phone] 180 tablet 1   HYDROcodone-acetaminophen (NORCO/VICODIN) 5-325 MG tablet 1-2 tabs po q6h prn pain 60 tablet 0   hydrOXYzine (VISTARIL) 25 MG capsule 1-2 tabs po qhs for sleep 60 capsule 3   levothyroxine (SYNTHROID) 100 MCG tablet TAKE 1 TABLET BY MOUTH EVERY DAY BEFORE BREAKFAST (Patient taking differently: Take 100 mcg by mouth daily before breakfast.) 90 tablet 1   OXYGEN Inhale into the lungs. 1Lt at bedtime  as needed      Potassium Chloride ER 20 MEQ TBCR TAKE 1 TABLET BY MOUTH EVERY DAY 90 tablet 1   rosuvastatin (CRESTOR) 20 MG tablet TAKE 1 TABLET BY MOUTH EVERY DAY (Patient taking differently: Take 20 mg by mouth daily.) 90 tablet 3   sacubitril-valsartan (ENTRESTO) 24-26 MG Take 1 tablet by mouth 2 (two) times daily. 60 tablet 11   Vitamin D, Ergocalciferol, (DRISDOL) 1.25 MG (50000 UNIT) CAPS capsule TAKE 1 CAPSULE (50,000 UNITS TOTAL) BY MOUTH 2 (TWO) TIMES A WEEK. (Patient taking differently: Take 50,000 Units by mouth 2 (two) times a week. Sunday and thursday) 24 capsule 3   No facility-administered medications prior to visit.    Allergies  Allergen Reactions   Aspirin Itching   Calcium Alginate Other (See Comments)    Excessive burning   Morphine     Other reaction(s): Hallucinations   Iodinated Diagnostic Agents Rash and Other (See Comments)    CT Contrast    ROS As per HPI  PE: Vitals with BMI 01/03/2021 12/30/2020 12/10/2020  Height _0  _1  -  Weight - (No Data) -  BMI - - -  Systolic 119 417 408  Diastolic 58 68 50  Pulse 64 62 59   Gen: Alert, well appearing.  Patient is oriented to person, place, time, and situation. AFFECT: pleasant, lucid thought and speech. R LL 2-3 + pitting edema w/out tenderness.  Pinkish discoloration to anterior aspect of R LL.   1 cm superficial ulceration R calf with serosanguinous drainage when pressure applied. Mild induration around this but no erythema or tenderness or fluctuance.  LABS:  Lab Results  Component Value Date   WBC 6.1 12/10/2020   HGB 12.3 12/10/2020   HCT 37.6 12/10/2020   MCV 95.2 12/10/2020   PLT 128 (L) 12/10/2020     Chemistry      Component Value Date/Time   NA 140 12/10/2020 1447   NA 141 05/29/2019 1031   NA 143 11/17/2014 0801   K 4.5 12/10/2020 1447   K 3.6 11/17/2014 0801  CL 102 12/10/2020 1447   CO2 30 12/10/2020 1447   CO2 30 (H) 11/17/2014 0801   BUN 23 12/10/2020 1447   BUN 18 05/29/2019 1031    BUN 16.3 11/17/2014 0801   CREATININE 0.90 12/10/2020 1447   CREATININE 0.95 09/10/2020 1451   CREATININE 0.7 11/17/2014 0801      Component Value Date/Time   CALCIUM 8.9 12/10/2020 1447   CALCIUM 8.9 11/17/2014 0801   ALKPHOS 30 (L) 05/30/2020 0139   ALKPHOS 70 11/17/2014 0801   AST 19 09/10/2020 1451   AST 32 11/17/2014 0801   ALT 13 09/10/2020 1451   ALT 30 11/17/2014 0801   BILITOT 0.7 09/10/2020 1451   BILITOT 0.5 05/29/2019 1031   BILITOT 0.55 11/17/2014 0801      IMPRESSION AND PLAN:  1) Chronic asymmetric bilat LE edema.  Biggest thing driving her edema is venous insufficiency. Recent cellulitis w/abscess->improving with abx but switching to cefdinir as of today since recent culture/sensitivity results. Getting a bit more fluid off with inc dose of lasix to 82m bid last few days has helped. Checking electrolytes and renal function again today and if stable then will continue lasix 647mbid.  An After Visit Summary was printed and given to the patient.  FOLLOW UP: Return for 2-3 wks f/u legs.  Signed:  PhCrissie SicklesMD           01/03/2021

## 2021-01-04 LAB — BASIC METABOLIC PANEL
BUN: 21 mg/dL (ref 6–23)
CO2: 35 mEq/L — ABNORMAL HIGH (ref 19–32)
Calcium: 9.2 mg/dL (ref 8.4–10.5)
Chloride: 97 mEq/L (ref 96–112)
Creatinine, Ser: 0.96 mg/dL (ref 0.40–1.20)
GFR: 53.59 mL/min — ABNORMAL LOW (ref >60.00)
Glucose, Bld: 102 mg/dL — ABNORMAL HIGH (ref 70–99)
Potassium: 4 mEq/L (ref 3.5–5.1)
Sodium: 139 mEq/L (ref 135–145)

## 2021-01-05 ENCOUNTER — Other Ambulatory Visit: Payer: Self-pay

## 2021-01-05 ENCOUNTER — Telehealth: Payer: Self-pay

## 2021-01-05 ENCOUNTER — Ambulatory Visit (INDEPENDENT_AMBULATORY_CARE_PROVIDER_SITE_OTHER): Payer: PPO

## 2021-01-05 ENCOUNTER — Other Ambulatory Visit: Payer: Self-pay | Admitting: Family Medicine

## 2021-01-05 DIAGNOSIS — Z0000 Encounter for general adult medical examination without abnormal findings: Secondary | ICD-10-CM | POA: Diagnosis not present

## 2021-01-05 MED ORDER — HYDROCODONE-ACETAMINOPHEN 5-325 MG PO TABS: ORAL_TABLET | ORAL | 0 refills | Status: DC

## 2021-01-05 NOTE — Telephone Encounter (Signed)
Requesting: [employer] Contract:n/a for this med UDS: n/a Last Visit: 01/03/21 Next Visit:01/28/21 Last Refill: 12/10/20(60,0)  Please Advise. Med pending

## 2021-01-05 NOTE — Telephone Encounter (Signed)
Yes okay 

## 2021-01-05 NOTE — Telephone Encounter (Signed)
Spoke with Colletta [place], [facility] nurse regarding pt's SN re-certification for abscess/swelling. It will be twice weekly for 2 weeks then decrease to once weekly for 2 weeks.   Okay for orders?

## 2021-01-05 NOTE — Telephone Encounter (Signed)
Verbal approval given to [place].

## 2021-01-05 NOTE — Progress Notes (Addendum)
Virtual Visit via Telephone Note  I connected with  Amanda Sellers on 01/05/21 at 11:45 AM EST by telephone and verified that I am speaking with the correct person using two identifiers.  Medicare Annual Wellness visit completed telephonically due to Covid-19 pandemic.   Persons participating in this call: This Health Coach and this patient and daughter Amanda Sellers   Location: Patient: Home Provider: Office   I discussed the limitations, risks, security and privacy concerns of performing an evaluation and management service by telephone and the availability of in person appointments. The patient expressed understanding and agreed to proceed.  Unable to perform video visit due to video visit attempted and failed and/or patient does not have video capability.   Some vital signs may be absent or patient reported.   Willette Brace, LPN   Subjective:   Amanda Sellers is a 87 y.o. female who presents for Medicare Annual (Subsequent) preventive examination.  Review of Systems     Cardiac Risk Factors include: advanced age (>28mn, >>57women);hypertension;dyslipidemia;obesity (BMI >30kg/m2)     Objective:    Today's Vitals   01/05/21 1134  PainSc: 6    There is no height or weight on file to calculate BMI.  Advanced Directives 01/05/2021 12/10/2020 05/25/2020 05/15/2019 05/15/2019 04/08/2018 04/21/2016  Does Patient Have a Medical Advance Directive? Yes Yes Yes No No Yes Yes  Type of [place]aramed[place]of [place] will Living will - - [place] Does patient want to make changes to medical advance directive? - No - Patient declined No - Patient declined - - - -  Copy of [place] Chart? Yes - validated most recent copy scanned in chart (See row information) No - copy requested - - - Yes - validated most recent copy scanned in chart (See row information)  Yes  Would patient like information on creating a medical advance directive? - No - Patient declined - No - Patient declined - - -    Current Medications (verified) Outpatient Encounter Medications as of 01/05/2021  Medication Sig   acetaminophen (TYLENOL) 500 MG tablet Take 1,000 mg by mouth every 6 (six) hours as needed for moderate pain or headache.   amiodarone (PACERONE) 200 MG tablet TAKE ONE TABLET BY MOUTH EVERY DAY. (Patient taking differently: Take 200 mg by mouth daily.)   apixaban (ELIQUIS) 5 MG TABS tablet Take 1 tablet (5 mg total) by mouth 2 (two) times daily.   cefdinir (OMNICEF) 300 MG capsule Take 1 capsule (300 mg total) by mouth 2 (two) times daily for 7 days.   cyanocobalamin (,VITAMIN B-12,) 1000 MCG/ML injection INJECT 1 ML INTO THE SKIN EVERY 30 DAYS.   diclofenac Sodium (VOLTAREN) 1 % GEL Apply 2 g topically 4 (four) times daily. Apply to LE joints that are in pain.   ferrous sulfate 325 (65 FE) MG tablet Take 1 tablet (325 mg total) by mouth 2 (two) times daily with a meal.   fish oil-omega-3 fatty acids 1000 MG capsule Take 2 g by mouth daily.   fluticasone (CUTIVATE) 0.05 % cream Apply to affected area of lower legs bid prn   furosemide (LASIX) 40 MG tablet Take 1 tablet (40 mg total) by mouth 2 (two) times daily. Appointment Required For Further Refills [phone](Patient taking differently: Take 60 mg by mouth 2 (two) times daily. Appointment Required For Further Refills [phone]   HYDROcodone-acetaminophen (NORCO/VICODIN) 5-325  MG tablet 1-2 tabs po q6h prn pain   hydrOXYzine (VISTARIL) 25 MG capsule 1-2 tabs po qhs for sleep   levothyroxine (SYNTHROID) 100 MCG tablet TAKE 1 TABLET BY MOUTH EVERY DAY BEFORE BREAKFAST (Patient taking differently: Take 100 mcg by mouth daily before breakfast.)   OXYGEN Inhale into the lungs. 1Lt at bedtime  as needed   Potassium Chloride ER 20 MEQ TBCR TAKE 1 TABLET BY MOUTH EVERY DAY   rosuvastatin (CRESTOR) 20 MG tablet  TAKE 1 TABLET BY MOUTH EVERY DAY   sacubitril-valsartan (ENTRESTO) 24-26 MG Take 1 tablet by mouth 2 (two) times daily.   Vitamin D, Ergocalciferol, (DRISDOL) 1.25 MG (50000 UNIT) CAPS capsule TAKE 1 CAPSULE (50,000 UNITS TOTAL) BY MOUTH 2 (TWO) TIMES A WEEK. (Patient taking differently: Take 50,000 Units by mouth 2 (two) times a week. Sunday and thursday)   No facility-administered encounter medications on file as of 01/05/2021.    Allergies (verified) Aspirin, Calcium alginate, Morphine, and Iodinated diagnostic agents   History: Past Medical History:  Diagnosis Date   Anemia of chronic disease    Anticoagulation adequate, Eliquis 10/16/2014   Anxiety    clonaz helps this AND her breathing   Cholelithiasis without cholecystitis 07/2020   noted on CT abd/pelv at [facility]   Chronic combined systolic and diastolic congestive heart failure ([place]) 48/54/6270   Complicated by atrial fibrillation; Echo 07/23/14: mild LVH, EF 50-55%, Gr 2 DD.;  Echo May 15, 2019: EF 35 to 40%, GRII DD.  (In setting of CHF exacerbation, class III)-> baseline class II symptoms. 09/2019 EF 55-60%, nl wall motion and LV fxn, grd II DD, mod MR.   Chronic renal insufficiency, stage III (moderate) (HCC)    GFR @ 50   COPD (chronic obstructive pulmonary disease) ([place])    recently noted on Xray, does not have any problems   Diverticulosis    Follicular lymphoma ([place])    Non Hodgkins B cell lymphoma; s/p chemo summer 2016--in remission still as of 09/2018: continue obs q 44mo  Hiatal hernia 07/2020   moderate size-[facility] [place] [facility]     06/2014   HTN (hypertension)    Hypothyroidism    IFG (impaired fasting glucose)    Lip cancer    Dr. BJanace Hoardexcised this: invasive SCC--no sign of cancer at ENT f/u 10/2015   Microcytic anemia    transfused 3 U total in hosp 07/2014   Obesity (BMI 30-39.9)    Pancytopenia due to antineoplastic chemotherapy ([place]     Persistent atrial fibrillation ([place]    With RVR; elec cardioversion 11/30/14.  On Amio since 10/2014; Dr. NLenna Gilfordfollowing her from pulm standpoint regarding this med.   Positive occult stool blood test 08/08/2014   Endoscopies ok 08/2014   Primary osteoarthritis of both knees 04/2016   Dr. GGladstone Lighter bone on bone--to get euflexxa to both knees.   Protein calorie malnutrition ([place] 2022   Pulmonary hypertension due to left heart disease ([place]    RLCP 05/19/2019: EF 35-40%. D1 50%, RI 70%, dLM-LAD 30%.  mPAP 43 mmHg - (62/19 mmHg), PCWP  28 mmHg (V wave 41 mmHg) LVEDP 34 mmHg   Pulmonary metastases (HCC) 07/24/2014   S/p left hip fracture 05/2020   ORIF   Vitamin B 12 deficiency    Vitamin D deficiency    dose increased to 50 K U tab TWICE weekly 04/2017   Past Surgical History:  Procedure Laterality Date  ABDOMINAL HYSTERECTOMY  1978   BONE MARROW BIOPSY  07/24/2014   BREAST BIOPSY     CARDIOVERSION N/A 10/16/2014   Procedure: CARDIOVERSION;  Surgeon: Lelon Perla, MD;  Location: [facility] ENDOSCOPY;  Service: Cardiovascular;  Laterality: N/A;   CARDIOVERSION N/A 11/30/2014   Procedure: CARDIOVERSION;  Surgeon: Larey Dresser, MD;  Location: [place];  Service: Cardiovascular;  Laterality: N/A;   CATARACT EXTRACTION  2020   OU: Dr. Katy Fitch   COLONOSCOPY  09/23/2014   small hemorrhoids, otherwise normal (performed for IDA and heme+ stool)   COLONOSCOPY     [place]     May 2022   INTRAMEDULLARY (IM) NAIL INTERTROCHANTERIC Left 05/25/2020   Procedure: INTRAMEDULLARY (IM) NAIL INTERTROCHANTRIC;  Surgeon: Erle Crocker, MD;  Location: [place];  Service: Orthopedics;  Laterality: Left;   MASS EXCISION Left 07/15/2015   Left lower lip mass--invasive SCC w/negative margins.  Procedure: EXCISION MASS;  Surgeon: Melissa Montane, MD;  Location: [place];  Service: ENT;  Laterality: Left;  Wedge excision left lower lip mass   NM MYOVIEW LTD  10/29/2014   medium  size mild surgery defect in the mid anterior and apical anterior location suggestive of breast attenuation. No reversibility. 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 D1 50%, RI 70%, dLM-LAD 30%.  mPAP 43 mmHg - (62/19 mmHg), PCWP  28 mmHg (V wave 41 mmHg) LVEDP 34 mmHg   TEE WITHOUT CARDIOVERSION N/A 10/16/2014   Procedure: TRANSESOPHAGEAL ECHOCARDIOGRAM (TEE);  Surgeon: Lelon Perla, MD;  Location: [facility] ENDOSCOPY;  Service: Cardiovascular;  Laterality: N/A;   TRANSTHORACIC ECHOCARDIOGRAM  10/09/2019   EF 55 to 60%.  GRII DD.  No R WMA.  Normal PAP and RAP.  Severe LA dilation.  Mild RA dilation.  Moderate MR (likely related to annular dilation in the setting of severe LAE.  Mild aortic valve sclerosis-no stenosis.   TRANSTHORACIC ECHOCARDIOGRAM  05/15/2019   Acute CHF exacerbation:  EF 35-40%.  Moderately reduced function.  (Previous EF was 55 to 60%) global HK.  GRII DD.  Mild LA dilation.  Normal RV size.   UPPER GI ENDOSCOPY  09/23/2014   small hiatus hernia, nodules in stomach biopsied (chronic active erosive atrophic gastritis with intestinal metaplasia--no dysplasia or malignancy) otherwise normal   Family History  Problem Relation Age of Onset   Melanoma Mother    Stomach cancer Father    Lymphoma Sister    Diabetes Daughter    Social History   Socioeconomic History   Marital status: Widowed    Spouse name: Not on file   Number of children: 8   Years of education: Not on file   Highest education level: Not on file  Occupational History   Occupation: retired  Tobacco Use   Smoking status: Never   Smokeless tobacco: Never  Vaping Use   Vaping Use: Never  used  Substance and Sexual Activity   Alcohol use: No    Alcohol/week: 0.0 standard drinks   Drug use: No   Sexual activity: Not on file  Other Topics Concern   Not on file  Social History Narrative  Widowed, has 8 children.   Orig from [place], now lives in [place].   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   No tob/alc/drugs.   Tries to walk, but her back starts to hurt.  Has to use a walker.   Social Determinants of Health   Financial Resource Strain: Low Risk    Difficulty of Paying Living Expenses: Not hard at all  Food Insecurity: No Food Insecurity   Worried About Charity fundraiser in the Last Year: Never true   [place] in the Last Year: Never true  Transportation Needs: No Transportation Needs   Lack of Transportation (Medical): No   Lack of Transportation (Non-Medical): No  Physical Activity: Insufficiently Active   Days of Exercise per Week: 5 days   Minutes of Exercise per Session: 10 min  Stress: No Stress Concern Present   Feeling of Stress : Not at all  Social Connections: Moderately Isolated   Frequency of Communication with Friends and Family: More than three times a week   Frequency of Social Gatherings with Friends and Family: Once a week   Attends Religious Services: More than 4 times per year   Active Member of [employer] or Organizations: No   Attends Archivist Meetings: Never   Marital Status: Widowed    Tobacco Counseling Counseling given: Not Answered   Clinical Intake:  Pre-visit preparation completed: Yes  Pain : 0-10 Pain Score: 6  Pain Type: Chronic pain Pain Location: Leg Pain Orientation: Right Pain Descriptors / Indicators: Other (Comment) (feels like electricity) Pain Onset: More than a month ago Pain Frequency: Intermittent     BMI - recorded: 31.96 Nutritional Status: BMI > 30  Obese Nutritional Risks: None Diabetes: No  How often do you need to have someone  help you when you read instructions, pamphlets, or other written materials from your doctor or pharmacy?: 1 - Never  Diabetic?No  Interpreter Needed?: No  Information entered by :: Charlott Rakes, LPN   Activities of Daily Living In your present state of health, do you have any difficulty performing the following activities: 01/05/2021 05/25/2020  Hearing? Y N  Comment slight hearing loss -  Vision? N N  Difficulty concentrating or making decisions? N N  Walking or climbing stairs? N Y  Comment avoid -  Dressing or bathing? N N  Doing errands, shopping? N N  Preparing Food and eating ? N -  Using the Toilet? N -  In the past six months, have you accidently leaked urine? N -  Do you have problems with loss of bowel control? N -  Managing your Medications? N -  Managing your Finances? N -  Housekeeping or managing your Housekeeping? N -  Some recent data might be hidden    Patient Care Team: Tammi Sou, MD as PCP - General (Family Medicine) Leonie Man, MD as PCP - Cardiology (Cardiology) Ladell Pier, MD as Consulting Physician (Oncology) Leonie Man, MD as Consulting Physician (Cardiology) Melissa Montane, MD as Consulting Physician (Otolaryngology) Melissa Montane, MD as Consulting Physician (Otolaryngology) Noralee Space, MD as Consulting Physician (Pulmonary Disease) Sherran Needs, NP as Nurse Practitioner (Nurse Practitioner) Latanya Maudlin, MD as Consulting Physician (Orthopedic Surgery) Erle Crocker, MD as Consulting Physician (Orthopedic Surgery)  Indicate any recent Medical Services you may have received from other than [facility] providers in the past year (date may be approximate).  Assessment:   This is a routine wellness examination for [place].  Hearing/Vision screen Hearing Screening - Comments:: Pt stated slight hearing issues  Vision Screening - Comments:: Pt follows up with Dr for annul eye exams   Dietary issues and exercise  activities discussed: Current Exercise Habits: Home exercise routine, Type of exercise: walking, Time (Minutes): 15, Frequency (Times/Week): 5 (family assist with walking), Weekly Exercise (Minutes/Week): 75   Goals Addressed             This Visit's Progress    Patient Stated       Get back to walking with walker        Depression Screen PHQ 2/9 Scores 01/05/2021 12/30/2020 11/04/2018 05/07/2017 02/07/2016 09/09/2014  PHQ - 2 Score 0 0 0 0 0 0  PHQ- 9 Score - - - 1 - -    Fall Risk Fall Risk  01/05/2021 12/30/2020 02/23/2020 11/04/2018 05/07/2017  Falls in the past year? 1 1 0 1 No  Number falls in past yr: 1 1 0 0 -  Injury with Fall? 1 1 0 0 -  Risk for fall due to : Impaired mobility;Impaired balance/gait;Impaired vision - - - -  Follow up Falls prevention discussed Falls evaluation completed Falls evaluation completed Falls evaluation completed -    FALL RISK PREVENTION PERTAINING TO THE HOME:  Any stairs in or around the home? No  If so, are there any without handrails? No  Home free of loose throw rugs in walkways, pet beds, electrical cords, etc? Yes  Adequate lighting in your home to reduce risk of falls? Yes   ASSISTIVE DEVICES UTILIZED TO PREVENT FALLS:  Life alert? No  Use of a cane, walker or w/c? Yes  Grab bars in the bathroom? Yes  Shower chair or bench in shower? Yes  Elevated toilet seat or a handicapped toilet? Yes   TIMED UP AND GO:  Was the test performed? No .   Cognitive Function:     6CIT Screen 01/05/2021  What Year? 0 points  What month? 0 points  What time? 0 points  Count back from 20 0 points  Months in reverse 0 points  Repeat phrase 2 points  Total Score 2    Immunizations Immunization History  Administered Date(s) Administered   Fluad Quad(high Dose 65+) 10/14/2018, 09/30/2019, 10/06/2020   Influenza, High Dose Seasonal PF 11/11/2013, 11/06/2016   Influenza,inj,Quad PF,6+ Mos 10/27/2014, 10/04/2015, 10/05/2017    Influenza-Unspecified 10/21/2012   Moderna Sars-Covid-2 Vaccination 02/04/2019, 03/17/2019, 11/18/2019, 04/21/2020   Pneumococcal Conjugate-13 02/07/2016   Pneumococcal-Unspecified 01/23/2014   Tdap 11/16/1995, 12/09/2014   Zoster, Live 03/19/2012    TDAP status: Up to date  Flu Vaccine status: Up to date  Pneumococcal vaccine status: Due, Education has been provided regarding the importance of this vaccine. Advised may receive this vaccine at local pharmacy or Health Dept. Aware to provide a copy of the vaccination record if obtained from local pharmacy or Health Dept. Verbalized acceptance and understanding.  Covid-19 vaccine status: Completed vaccines  Qualifies for Shingles Vaccine? Yes   Zostavax completed Yes   Shingrix Completed?: No.    Education has been provided regarding the importance of this vaccine. Patient has been advised to call insurance company to determine out of pocket expense if they have not yet received this vaccine. Advised may also receive vaccine at local pharmacy or Health Dept. Verbalized acceptance and understanding.  Screening Tests Health Maintenance  Topic Date Due   Zoster Vaccines- Shingrix (1 of  2) Never done   Pneumonia Vaccine 90+ Years old (2 - PPSV23 if available, else PCV20) 02/06/2017   COVID-19 Vaccine (5 - Booster for Moderna series) 01/15/2021 (Originally 06/16/2020)   TETANUS/TDAP  12/08/2024   INFLUENZA VACCINE  Completed   DEXA SCAN  Completed   HPV VACCINES  Aged Out    Health Maintenance  Health Maintenance Due  Topic Date Due   Zoster Vaccines- Shingrix (1 of 2) Never done   Pneumonia Vaccine 52+ Years old (2 - PPSV23 if available, else PCV20) 02/06/2017    Colorectal cancer screening: No longer required.   Mammogram status: No longer required due to age.  Bone Density status: Completed 12/12/05. Results reflect: Bone density results: OSTEOPOROSIS. Repeat every completed  years.   Additional Screening:   Vision  Screening: Recommended annual ophthalmology exams for early detection of glaucoma and other disorders of the eye. Is the patient up to date with their annual eye exam?  Yes  Who is the provider or what is the name of the office in which the patient attends annual eye exams? Provider on [address]  If pt is not established with a provider, would they like to be referred to a provider to establish care? No .   Dental Screening: Recommended annual dental exams for proper oral hygiene  Community Resource Referral / Chronic Care Management: CRR required this visit?  No   CCM required this visit?  No      Plan:     I have personally reviewed and noted the following in the patients chart:   Medical and social history Use of alcohol, tobacco or illicit drugs  Current medications and supplements including opioid prescriptions.  Functional ability and status Nutritional status Physical activity Advanced directives List of other physicians Hospitalizations, surgeries, and ER visits in previous 12 months Vitals Screenings to include cognitive, depression, and falls Referrals and appointments  In addition, I have reviewed and discussed with patient certain preventive protocols, quality metrics, and best practice recommendations. A written personalized care plan for preventive services as well as general preventive health recommendations were provided to patient.     Willette Brace, LPN   28/78/6767   Nurse Notes: None

## 2021-01-05 NOTE — Patient Instructions (Signed)
Amanda Sellers , Thank you for taking time to come for your Medicare Wellness Visit. I appreciate your ongoing commitment to your health goals. Please review the following plan we discussed and let me know if I can assist you in the future.   Screening recommendations/referrals: Colonoscopy: No longer required  Mammogram: No longer required  Bone Density: completed  Recommended yearly ophthalmology/optometry visit for glaucoma screening and checkup Recommended yearly dental visit for hygiene and checkup  Vaccinations: Influenza vaccine: Done 10/06/20 repeat every year  Pneumococcal vaccine: due  Tdap vaccine: Done 12/09/14 repeat every 1 years  Shingles vaccine: Shingrix discussed. Please contact your pharmacy for coverage information.    Covid-19:Completed 1/12, 2/22, 11/18/19 & 04/21/20  Advanced directives: Copies in chart  Conditions/risks identified: Get back to walking better with walker   Next appointment: Follow up in one year for your annual wellness visit    Preventive Care 65 Years and Older, Female Preventive care refers to lifestyle choices and visits with your health care provider that can promote health and wellness. What does preventive care include? A yearly physical exam. This is also called an annual well check. Dental exams once or twice a year. Routine eye exams. Ask your health care provider how often you should have your eyes checked. Personal lifestyle choices, including: Daily care of your teeth and gums. Regular physical activity. Eating a healthy diet. Avoiding tobacco and drug use. Limiting alcohol use. Practicing safe sex. Taking low-dose aspirin every day. Taking vitamin and mineral supplements as recommended by your health care provider. What happens during an annual well check? The services and screenings done by your health care provider during your annual well check will depend on your age, overall health, lifestyle risk factors, and family history  of disease. Counseling  Your health care provider may ask you questions about your: Alcohol use. Tobacco use. Drug use. Emotional well-being. Home and relationship well-being. Sexual activity. Eating habits. History of falls. Memory and ability to understand (cognition). Work and work Statistician. Reproductive health. Screening  You may have the following tests or measurements: Height, weight, and BMI. Blood pressure. Lipid and cholesterol levels. These may be checked every 5 years, or more frequently if you are over 83 years old. Skin check. Lung cancer screening. You may have this screening every year starting at age 58 if you have a 30-pack-year history of smoking and currently smoke or have quit within the past 15 years. Fecal occult blood test (FOBT) of the stool. You may have this test every year starting at age 58. Flexible sigmoidoscopy or colonoscopy. You may have a sigmoidoscopy every 5 years or a colonoscopy every 10 years starting at age 31. Hepatitis C blood test. Hepatitis B blood test. Sexually transmitted disease (STD) testing. Diabetes screening. This is done by checking your blood sugar (glucose) after you have not eaten for a while (fasting). You may have this done every 1-3 years. Bone density scan. This is done to screen for osteoporosis. You may have this done starting at age 67. Mammogram. This may be done every 1-2 years. Talk to your health care provider about how often you should have regular mammograms. Talk with your health care provider about your test results, treatment options, and if necessary, the need for more tests. Vaccines  Your health care provider may recommend certain vaccines, such as: Influenza vaccine. This is recommended every year. Tetanus, diphtheria, and acellular pertussis (Tdap, Td) vaccine. You may need a Td booster every 10 years. Zoster vaccine.  You may need this after age 7. Pneumococcal 13-valent conjugate (PCV13) vaccine. One  dose is recommended after age 39. Pneumococcal polysaccharide (PPSV23) vaccine. One dose is recommended after age 17. Talk to your health care provider about which screenings and vaccines you need and how often you need them. This information is not intended to replace advice given to you by your health care provider. Make sure you discuss any questions you have with your health care provider. Document Released: 02/05/2015 Document Revised: 09/29/2015 Document Reviewed: 11/10/2014 Elsevier Interactive Patient Education  2017 [place] Prevention in the Home Falls can cause injuries. They can happen to people of all ages. There are many things you can do to make your home safe and to help prevent falls. What can I do on the outside of my home? Regularly fix the edges of walkways and driveways and fix any cracks. Remove anything that might make you trip as you walk through a door, such as a raised step or threshold. Trim any bushes or trees on the path to your home. Use bright outdoor lighting. Clear any walking paths of anything that might make someone trip, such as rocks or tools. Regularly check to see if handrails are loose or broken. Make sure that both sides of any steps have handrails. Any raised decks and porches should have guardrails on the edges. Have any leaves, snow, or ice cleared regularly. Use sand or salt on walking paths during winter. Clean up any spills in your garage right away. This includes oil or grease spills. What can I do in the bathroom? Use night lights. Install grab bars by the toilet and in the tub and shower. Do not use towel bars as grab bars. Use non-skid mats or decals in the tub or shower. If you need to sit down in the shower, use a plastic, non-slip stool. Keep the floor dry. Clean up any water that spills on the floor as soon as it happens. Remove soap buildup in the tub or shower regularly. Attach bath mats securely with double-sided  non-slip rug tape. Do not have throw rugs and other things on the floor that can make you trip. What can I do in the bedroom? Use night lights. Make sure that you have a light by your bed that is easy to reach. Do not use any sheets or blankets that are too big for your bed. They should not hang down onto the floor. Have a firm chair that has side arms. You can use this for support while you get dressed. Do not have throw rugs and other things on the floor that can make you trip. What can I do in the kitchen? Clean up any spills right away. Avoid walking on wet floors. Keep items that you use a lot in easy-to-reach places. If you need to reach something above you, use a strong step stool that has a grab bar. Keep electrical cords out of the way. Do not use floor polish or wax that makes floors slippery. If you must use wax, use non-skid floor wax. Do not have throw rugs and other things on the floor that can make you trip. What can I do with my stairs? Do not leave any items on the stairs. Make sure that there are handrails on both sides of the stairs and use them. Fix handrails that are broken or loose. Make sure that handrails are as long as the stairways. Check any carpeting to make sure that it is  firmly attached to the stairs. Fix any carpet that is loose or worn. Avoid having throw rugs at the top or bottom of the stairs. If you do have throw rugs, attach them to the floor with carpet tape. Make sure that you have a light switch at the top of the stairs and the bottom of the stairs. If you do not have them, ask someone to add them for you. What else can I do to help prevent falls? Wear shoes that: Do not have high heels. Have rubber bottoms. Are comfortable and fit you well. Are closed at the toe. Do not wear sandals. If you use a stepladder: Make sure that it is fully opened. Do not climb a closed stepladder. Make sure that both sides of the stepladder are locked into place. Ask  someone to hold it for you, if possible. Clearly mark and make sure that you can see: Any grab bars or handrails. First and last steps. Where the edge of each step is. Use tools that help you move around (mobility aids) if they are needed. These include: Canes. Walkers. Scooters. Crutches. Turn on the lights when you go into a dark area. Replace any light bulbs as soon as they burn out. Set up your furniture so you have a clear path. Avoid moving your furniture around. If any of your floors are uneven, fix them. If there are any pets around you, be aware of where they are. Review your medicines with your doctor. Some medicines can make you feel dizzy. This can increase your chance of falling. Ask your doctor what other things that you can do to help prevent falls. This information is not intended to replace advice given to you by your health care provider. Make sure you discuss any questions you have with your health care provider. Document Released: 11/05/2008 Document Revised: 06/17/2015 Document Reviewed: 02/13/2014 Elsevier Interactive Patient Education  2017 [employer].

## 2021-01-06 ENCOUNTER — Other Ambulatory Visit: Payer: Self-pay | Admitting: Family Medicine

## 2021-01-11 DIAGNOSIS — I872 Venous insufficiency (chronic) (peripheral): Secondary | ICD-10-CM | POA: Diagnosis not present

## 2021-01-11 DIAGNOSIS — I5032 Chronic diastolic (congestive) heart failure: Secondary | ICD-10-CM | POA: Diagnosis not present

## 2021-01-11 DIAGNOSIS — Z79891 Long term (current) use of opiate analgesic: Secondary | ICD-10-CM | POA: Diagnosis not present

## 2021-01-11 DIAGNOSIS — I130 Hypertensive heart and chronic kidney disease with heart failure and stage 1 through stage 4 chronic kidney disease, or unspecified chronic kidney disease: Secondary | ICD-10-CM | POA: Diagnosis not present

## 2021-01-11 DIAGNOSIS — Z8781 Personal history of (healed) traumatic fracture: Secondary | ICD-10-CM | POA: Diagnosis not present

## 2021-01-11 DIAGNOSIS — Z7901 Long term (current) use of anticoagulants: Secondary | ICD-10-CM | POA: Diagnosis not present

## 2021-01-11 DIAGNOSIS — Z9981 Dependence on supplemental oxygen: Secondary | ICD-10-CM | POA: Diagnosis not present

## 2021-01-11 DIAGNOSIS — N189 Chronic kidney disease, unspecified: Secondary | ICD-10-CM | POA: Diagnosis not present

## 2021-01-11 DIAGNOSIS — I480 Paroxysmal atrial fibrillation: Secondary | ICD-10-CM | POA: Diagnosis not present

## 2021-01-11 DIAGNOSIS — I38 Endocarditis, valve unspecified: Secondary | ICD-10-CM | POA: Diagnosis not present

## 2021-01-11 DIAGNOSIS — L02415 Cutaneous abscess of right lower limb: Secondary | ICD-10-CM | POA: Diagnosis not present

## 2021-01-13 ENCOUNTER — Telehealth: Payer: Self-pay

## 2021-01-13 NOTE — Telephone Encounter (Signed)
Received written orders for pt, ok given per provider to use signature stamp for verbal orders already given. Orders faxed back

## 2021-01-13 NOTE — Telephone Encounter (Signed)
See 12/14 phone note

## 2021-01-25 ENCOUNTER — Other Ambulatory Visit: Payer: Self-pay | Admitting: Cardiology

## 2021-01-25 DIAGNOSIS — R609 Edema, unspecified: Secondary | ICD-10-CM | POA: Diagnosis not present

## 2021-01-25 DIAGNOSIS — I509 Heart failure, unspecified: Secondary | ICD-10-CM | POA: Diagnosis not present

## 2021-01-25 NOTE — Telephone Encounter (Signed)
Prescription refill request for Eliquis received. Indication: Afib  Last office visit:04/30/20 Ellyn Hack)  Scr: 0.96 (01/03/21)  Age: 88 Weight: 89.8kg  Appropriate dose and refill sent to requested pharmacy.

## 2021-01-26 DIAGNOSIS — Z8781 Personal history of (healed) traumatic fracture: Secondary | ICD-10-CM | POA: Diagnosis not present

## 2021-01-26 DIAGNOSIS — I5032 Chronic diastolic (congestive) heart failure: Secondary | ICD-10-CM | POA: Diagnosis not present

## 2021-01-26 DIAGNOSIS — I480 Paroxysmal atrial fibrillation: Secondary | ICD-10-CM | POA: Diagnosis not present

## 2021-01-26 DIAGNOSIS — N189 Chronic kidney disease, unspecified: Secondary | ICD-10-CM | POA: Diagnosis not present

## 2021-01-26 DIAGNOSIS — I38 Endocarditis, valve unspecified: Secondary | ICD-10-CM | POA: Diagnosis not present

## 2021-01-26 DIAGNOSIS — Z79891 Long term (current) use of opiate analgesic: Secondary | ICD-10-CM | POA: Diagnosis not present

## 2021-01-26 DIAGNOSIS — L02415 Cutaneous abscess of right lower limb: Secondary | ICD-10-CM | POA: Diagnosis not present

## 2021-01-26 DIAGNOSIS — Z7901 Long term (current) use of anticoagulants: Secondary | ICD-10-CM | POA: Diagnosis not present

## 2021-01-26 DIAGNOSIS — I872 Venous insufficiency (chronic) (peripheral): Secondary | ICD-10-CM | POA: Diagnosis not present

## 2021-01-26 DIAGNOSIS — Z9981 Dependence on supplemental oxygen: Secondary | ICD-10-CM | POA: Diagnosis not present

## 2021-01-26 DIAGNOSIS — I130 Hypertensive heart and chronic kidney disease with heart failure and stage 1 through stage 4 chronic kidney disease, or unspecified chronic kidney disease: Secondary | ICD-10-CM | POA: Diagnosis not present

## 2021-01-28 ENCOUNTER — Telehealth: Payer: Self-pay

## 2021-01-28 ENCOUNTER — Ambulatory Visit (INDEPENDENT_AMBULATORY_CARE_PROVIDER_SITE_OTHER): Payer: PPO | Admitting: Family Medicine

## 2021-01-28 ENCOUNTER — Encounter: Payer: Self-pay | Admitting: Family Medicine

## 2021-01-28 ENCOUNTER — Other Ambulatory Visit: Payer: Self-pay

## 2021-01-28 VITALS — BP 133/68 | HR 80 | Temp 97.7°F | Ht 66.0 in

## 2021-01-28 DIAGNOSIS — N183 Chronic kidney disease, stage 3 unspecified: Secondary | ICD-10-CM

## 2021-01-28 DIAGNOSIS — R600 Localized edema: Secondary | ICD-10-CM | POA: Diagnosis not present

## 2021-01-28 DIAGNOSIS — Z8781 Personal history of (healed) traumatic fracture: Secondary | ICD-10-CM | POA: Diagnosis not present

## 2021-01-28 DIAGNOSIS — I890 Lymphedema, not elsewhere classified: Secondary | ICD-10-CM

## 2021-01-28 DIAGNOSIS — Z9889 Other specified postprocedural states: Secondary | ICD-10-CM | POA: Diagnosis not present

## 2021-01-28 DIAGNOSIS — M25552 Pain in left hip: Secondary | ICD-10-CM

## 2021-01-28 MED ORDER — CEFDINIR 300 MG PO CAPS: 300.0000 mg | ORAL_CAPSULE | Freq: Two times a day (BID) | ORAL | 0 refills | Status: DC

## 2021-01-28 MED ORDER — HYDROCODONE-ACETAMINOPHEN 5-325 MG PO TABS: ORAL_TABLET | ORAL | 0 refills | Status: DC

## 2021-01-28 MED ORDER — VITAMIN D (ERGOCALCIFEROL) 1.25 MG (50000 UNIT) PO CAPS: 50000.0000 [IU] | ORAL_CAPSULE | ORAL | 3 refills | Status: DC

## 2021-01-28 NOTE — Telephone Encounter (Signed)
[facility] PT ordered  [3:04 PM] Amanda Sellers Pls order [facility] PT for [name], dx L hip pain, hx of left hip fracture

## 2021-01-28 NOTE — Progress Notes (Signed)
OFFICE VISIT  01/28/2021  CC:  Chief Complaint  Patient presents with   Follow-up    Lymphedema; placement on R leg and pain in L leg    HPI:    Patient is a 88 y.o. female who presents accompanied by her daughter for 3 week f/u chronic asymmetric venous insufficiency/lymphedema. A/P as of last visit: "1) Chronic asymmetric bilat LE edema.  Biggest thing driving her edema is venous insufficiency. Recent cellulitis w/abscess->improving with abx but switching to cefdinir as of today since recent culture/sensitivity results. Getting a bit more fluid off with inc dose of lasix to 38m bid last few days has helped. Checking electrolytes and renal function again today and if stable then will continue lasix 665mbid."  INTERIM HX:  Last visit her renal function was stable so I continued her on Lasix 60 mg twice a day. Swelling is still fluctuating, right leg still significant swelling, small bruise on anterior aspect of right ankle opened up a little today, has leaked a little fluid. Little more pinkish in the skin around this area. Pain in the legs is most prominent in the right lower leg and she says lately she has been having more pain in the lateral aspect of her left hip, intermittently radiating down the entire left leg.  No buttocks pain, no groin pain. No fever. She rations her hydrocodone pills--often takes only a half tab in the morning and evening.  Her family members talk her into taking a whole tab at times.   LE venous doppler u/s 12/10/20: IMPRESSION: 1. No evidence of femoropopliteal DVT within the RIGHT lower extremity. 2. RIGHT mid calf subcutaneous collection, measuring up to 7 cm, and favored to represent hematoma. Additionally, a prominent right inguinal lymph node was noted.  Past Medical History:  Diagnosis Date   Anemia of chronic disease    Anticoagulation adequate, Eliquis 10/16/2014   Anxiety    clonaz helps this AND her breathing   Cholelithiasis without  cholecystitis 07/2020   noted on CT abd/pelv at [facility]   Chronic combined systolic and diastolic congestive heart failure ([id number] Complicated by atrial fibrillation; Echo 07/23/14: mild LVH, EF 50-55%, Gr 2 DD.;  Echo May 15, 2019: EF 35 to 40%, GRII DD.  (In setting of CHF exacerbation, class III)-> baseline class II symptoms. 09/2019 EF 55-60%, nl wall motion and LV fxn, grd II DD, mod MR.   Chronic renal insufficiency, stage III (moderate) (HCC)    GFR @ 50   COPD (chronic obstructive pulmonary disease) ([facility]   recently noted on Xray, does not have any problems   Diverticulosis    Follicular lymphoma ([facility]   Non Hodgkins B cell lymphoma; s/p chemo summer 2016--in remission still as of 09/2018: continue obs q [id number] hernia 07/2020   moderate size-[facility]     06/2014   HTN (hypertension)    Hypothyroidism    IFG (impaired fasting glucose)    Lip cancer    Dr. ByeJanace Hoardcised this: invasive SCC--no sign of cancer at ENT f/u 10/2015   Microcytic anemia    transfused 3 U total in hosp 07/2014   Obesity (BMI 30-39.9)    Pancytopenia due to antineoplastic chemotherapy ([facility]  Persistent atrial fibrillation ([facility]  With RVR; elec cardioversion 11/30/14.  On Amio since 10/2014; Dr. NadLenna Gilfordllowing her from pulm standpoint regarding this med.  Positive occult stool blood test 08/08/2014   Endoscopies ok 08/2014   Primary osteoarthritis of both knees 04/2016   Dr. Gladstone Lighter: bone on bone--to get euflexxa to both knees.   Protein calorie malnutrition ([place]) 2022   Pulmonary hypertension due to left heart disease ([place])    RLCP 05/19/2019: EF 35-40%. D1 50%, RI 70%, dLM-LAD 30%.  mPAP 43 mmHg - (62/19 mmHg), PCWP  28 mmHg (V wave 41 mmHg) LVEDP 34 mmHg   Pulmonary metastases (HCC) 07/24/2014   S/p left hip fracture 05/2020   ORIF   Vitamin B 12 deficiency    Vitamin D deficiency    dose increased to 50 K U  tab TWICE weekly 04/2017    Past Surgical History:  Procedure Laterality Date   ABDOMINAL HYSTERECTOMY  1978   BONE MARROW BIOPSY  07/24/2014   BREAST BIOPSY     CARDIOVERSION N/A 10/16/2014   Procedure: CARDIOVERSION;  Surgeon: Lelon Perla, MD;  Location: [facility] ENDOSCOPY;  Service: Cardiovascular;  Laterality: N/A;   CARDIOVERSION N/A 11/30/2014   Procedure: CARDIOVERSION;  Surgeon: Larey Dresser, MD;  Location: [place];  Service: Cardiovascular;  Laterality: N/A;   CATARACT EXTRACTION  2020   OU: Dr. Katy Fitch   COLONOSCOPY  09/23/2014   small hemorrhoids, otherwise normal (performed for IDA and heme+ stool)   COLONOSCOPY     [place]     May 2022   INTRAMEDULLARY (IM) NAIL INTERTROCHANTERIC Left 05/25/2020   Procedure: INTRAMEDULLARY (IM) NAIL INTERTROCHANTRIC;  Surgeon: Erle Crocker, MD;  Location: [place];  Service: Orthopedics;  Laterality: Left;   MASS EXCISION Left 07/15/2015   Left lower lip mass--invasive SCC w/negative margins.  Procedure: EXCISION MASS;  Surgeon: Melissa Montane, MD;  Location: [place];  Service: ENT;  Laterality: Left;  Wedge excision left lower lip mass   NM MYOVIEW LTD  10/29/2014   medium size mild surgery defect in the mid anterior and apical anterior location suggestive of breast attenuation. No reversibility. LOW RISK   PFTs  02/2015   Restriction with diffusion defect: cardiologist referred her to pulm to help interpret PFTs and decide whether she has amiodarone toxicity   RIGHT/LEFT HEART CATH AND CORONARY ANGIOGRAPHY N/A 05/19/2019   Procedure: RIGHT/LEFT HEART CATH AND CORONARY ANGIOGRAPHY;  Surgeon: Troy Sine, MD;  Location: [place] CV LAB;; EF 35-40%. D1 50%, RI 70%, dLM-LAD 30%.  mPAP 43 mmHg - (62/19 mmHg), PCWP  28 mmHg (V wave 41 mmHg) LVEDP 34 mmHg   TEE WITHOUT CARDIOVERSION N/A 10/16/2014   Procedure: TRANSESOPHAGEAL ECHOCARDIOGRAM (TEE);  Surgeon: Lelon Perla, MD;  Location[facility]  ENDOSCOPY;  Service: Cardiovascular;  Laterality: N/A;   TRANSTHORACIC ECHOCARDIOGRAM  10/09/2019   EF 55 to 60%.  GRII DD.  No R WMA.  Normal PAP and RAP.  Severe LA dilation.  Mild RA dilation.  Moderate MR (likely related to annular dilation in the setting of severe LAE.  Mild aortic valve sclerosis-no stenosis.   TRANSTHORACIC ECHOCARDIOGRAM  05/15/2019   Acute CHF exacerbation:  EF 35-40%.  Moderately reduced function.  (Previous EF was 55 to 60%) global HK.  GRII DD.  Mild LA dilation.  Normal RV size.   UPPER GI ENDOSCOPY  09/23/2014   small hiatus hernia, nodules in stomach biopsied (chronic active erosive atrophic gastritis with intestinal metaplasia--no dysplasia or malignancy) otherwise normal    Outpatient Medications Prior to Visit  Medication Sig Dispense Refill  acetaminophen (TYLENOL) 500 MG tablet Take 1,000 mg by mouth every 6 (six) hours as needed for moderate pain or headache.     amiodarone (PACERONE) 200 MG tablet TAKE ONE TABLET BY MOUTH EVERY DAY. (Patient taking differently: Take 200 mg by mouth daily.) 90 tablet 3   cyanocobalamin (,VITAMIN B-12,) 1000 MCG/ML injection INJECT 1 ML INTO THE SKIN EVERY 30 DAYS. 3 mL 3   diclofenac Sodium (VOLTAREN) 1 % GEL APPLY 2 G TOPICALLY 4 (FOUR) TIMES DAILY. APPLY TO LE JOINTS THAT ARE IN PAIN. 300 g 1   ELIQUIS 5 MG TABS tablet TAKE 1 TABLET BY MOUTH TWICE A DAY 60 tablet 8   ferrous sulfate 325 (65 FE) MG tablet Take 1 tablet (325 mg total) by mouth 2 (two) times daily with a meal. 30 tablet 1   fish oil-omega-3 fatty acids 1000 MG capsule Take 2 g by mouth daily.     fluticasone (CUTIVATE) 0.05 % cream Apply to affected area of lower legs bid prn 60 g 2   furosemide (LASIX) 40 MG tablet Take 1 tablet (40 mg total) by mouth 2 (two) times daily. Appointment Required For Further Refills [phone] (Patient taking differently: Take 60 mg by mouth 2 (two) times daily. Appointment Required For Further Refills [phone]) 180 tablet  1   hydrOXYzine (VISTARIL) 25 MG capsule 1-2 tabs po qhs for sleep 60 capsule 3   levothyroxine (SYNTHROID) 100 MCG tablet TAKE 1 TABLET BY MOUTH EVERY DAY BEFORE BREAKFAST (Patient taking differently: Take 100 mcg by mouth daily before breakfast.) 90 tablet 1   OXYGEN Inhale into the lungs. 1Lt at bedtime  as needed     Potassium Chloride ER 20 MEQ TBCR TAKE 1 TABLET BY MOUTH EVERY DAY 90 tablet 1   rosuvastatin (CRESTOR) 20 MG tablet TAKE 1 TABLET BY MOUTH EVERY DAY 90 tablet 3   sacubitril-valsartan (ENTRESTO) 24-26 MG Take 1 tablet by mouth 2 (two) times daily. 60 tablet 11   HYDROcodone-acetaminophen (NORCO/VICODIN) 5-325 MG tablet 1-2 tabs po q6h prn pain 60 tablet 0   Vitamin D, Ergocalciferol, (DRISDOL) 1.25 MG (50000 UNIT) CAPS capsule TAKE 1 CAPSULE (50,000 UNITS TOTAL) BY MOUTH 2 (TWO) TIMES A WEEK. (Patient taking differently: Take 50,000 Units by mouth 2 (two) times a week. Sunday and thursday) 24 capsule 3   No facility-administered medications prior to visit.    Allergies  Allergen Reactions   Aspirin Itching   Calcium Alginate Other (See Comments)    Excessive burning   Morphine     Other reaction(s): Hallucinations   Iodinated Contrast Media Rash and Other (See Comments)    CT Contrast    ROS As per HPI  PE: Vitals with BMI 01/28/2021 01/03/2021 12/30/2020  Height '5\' 6"'  '5\' 6"'  '5\' 6"'   Weight (No Data) - (No Data)  BMI - - -  Systolic [phone]  Diastolic 68 58 68  Pulse 80 64 62  02 sat 94% on RA today   Physical Exam  Gen: Alert, non-toxic appearing.  She is moaning/breathing heavy in pain at times today. Patient is oriented to person, place, time, and situation. AFFECT: pleasant, lucid thought and speech. R LL 3+ pitting edema, some non-pitting edema as well. Has quarter size ecchymosis anterior aspect of right ankle.  Central portion of this has superficial ulceration and I was able to express small amount of yellowish discharge. She is tender to  palpation all over the right lower extremity.  She does have some  subtle erythema over anterolateral left ankle. She has some tenderness to palpation over the lateral aspect of her left hip and this extends down her left thigh some.  She can only lift her left thigh about 10 to 15 degrees due to pain.  LABS:  Last CBC Lab Results  Component Value Date   WBC 6.1 12/10/2020   HGB 12.3 12/10/2020   HCT 37.6 12/10/2020   MCV 95.2 12/10/2020   MCH 31.1 12/10/2020   RDW 12.2 12/10/2020   PLT 128 (L) 34/03/5246   Last metabolic panel Lab Results  Component Value Date   GLUCOSE 102 (H) 01/03/2021   NA 139 01/03/2021   K 4.0 01/03/2021   CL 97 01/03/2021   CO2 35 (H) 01/03/2021   BUN 21 01/03/2021   CREATININE 0.96 01/03/2021   GFRNONAA >60 12/10/2020   CALCIUM 9.2 01/03/2021   PHOS 4.7 (H) 05/21/2019   PROT 5.5 (L) 09/10/2020   ALBUMIN 3.2 (L) 05/30/2020   LABGLOB 3.2 07/23/2014   AGRATIO 0.8 07/23/2014   BILITOT 0.7 09/10/2020   ALKPHOS 30 (L) 05/30/2020   AST 19 09/10/2020   ALT 13 09/10/2020   ANIONGAP 8 12/10/2020   Last hemoglobin A1c Lab Results  Component Value Date   HGBA1C 5.5 02/23/2020   IMPRESSION AND PLAN:  #1 right lower leg lymphedema, recurrent superficial ulcerations and left lower leg, recurrent cellulitis in left lower leg. This is in the setting of chronic renal insufficiency stage III. Start Omnicef 300 mg twice a day x7 days. Continue Lasix 60 mg twice a day. Refer to vein and vascular specialists in [place]. Electrolytes and creatinine today.  #2 left hip pain.  History of left hip fracture about 6 months ago, is status post IM nail surgery. Her orthopedist has released her. Home health PT ended quite a while ago. I think her current pain is muscular--likely glut med and/or GT bursitis.  Also could have piriformis syndrome--she has to sit all day either in her lift chair or wheelchair.  Rarely walking  these days. Encouraged her to use her Vicodin 1-2 tabs 3 times a day as needed.  #180 sent in today. Home health PT will be reordered.  An After Visit Summary was printed and given to the patient.  FOLLOW UP: Return in about 1 week (around 02/04/2021) for f/u R leg infection.  Signed:  Crissie Sickles, MD           01/28/2021

## 2021-01-29 LAB — BASIC METABOLIC PANEL
BUN/Creatinine Ratio: 20 (calc) (ref 6–22)
BUN: 22 mg/dL (ref 7–25)
CO2: 30 mmol/L (ref 20–32)
Calcium: 9.2 mg/dL (ref 8.6–10.4)
Chloride: 98 mmol/L (ref 98–110)
Creat: 1.1 mg/dL — ABNORMAL HIGH (ref 0.60–0.95)
Glucose, Bld: 92 mg/dL (ref 65–99)
Potassium: 4 mmol/L (ref 3.5–5.3)
Sodium: 142 mmol/L (ref 135–146)

## 2021-02-03 ENCOUNTER — Ambulatory Visit (INDEPENDENT_AMBULATORY_CARE_PROVIDER_SITE_OTHER): Payer: PPO | Admitting: Family Medicine

## 2021-02-03 ENCOUNTER — Other Ambulatory Visit: Payer: Self-pay

## 2021-02-03 VITALS — BP 125/57 | HR 63 | Temp 97.7°F | Ht 66.0 in

## 2021-02-03 DIAGNOSIS — Z8572 Personal history of non-Hodgkin lymphomas: Secondary | ICD-10-CM | POA: Diagnosis not present

## 2021-02-03 DIAGNOSIS — L03115 Cellulitis of right lower limb: Secondary | ICD-10-CM | POA: Diagnosis not present

## 2021-02-03 DIAGNOSIS — N183 Chronic kidney disease, stage 3 unspecified: Secondary | ICD-10-CM

## 2021-02-03 DIAGNOSIS — S81801D Unspecified open wound, right lower leg, subsequent encounter: Secondary | ICD-10-CM | POA: Diagnosis not present

## 2021-02-03 DIAGNOSIS — N2889 Other specified disorders of kidney and ureter: Secondary | ICD-10-CM

## 2021-02-03 DIAGNOSIS — R609 Edema, unspecified: Secondary | ICD-10-CM

## 2021-02-03 DIAGNOSIS — R590 Localized enlarged lymph nodes: Secondary | ICD-10-CM | POA: Diagnosis not present

## 2021-02-03 LAB — BASIC METABOLIC PANEL
BUN: 32 mg/dL — ABNORMAL HIGH (ref 6–23)
CO2: 33 mEq/L — ABNORMAL HIGH (ref 19–32)
Calcium: 9 mg/dL (ref 8.4–10.5)
Chloride: 100 mEq/L (ref 96–112)
Creatinine, Ser: 1.17 mg/dL (ref 0.40–1.20)
GFR: 42.24 mL/min — ABNORMAL LOW (ref >60.00)
Glucose, Bld: 122 mg/dL — ABNORMAL HIGH (ref 70–99)
Potassium: 3.9 mEq/L (ref 3.5–5.1)
Sodium: 141 mEq/L (ref 135–145)

## 2021-02-03 LAB — MAGNESIUM: Magnesium: 2.5 mg/dL (ref 1.5–2.5)

## 2021-02-03 MED ORDER — CEFDINIR 300 MG PO CAPS: 300.0000 mg | ORAL_CAPSULE | Freq: Two times a day (BID) | ORAL | 0 refills | Status: DC

## 2021-02-03 NOTE — Patient Instructions (Addendum)
Vein clinic---call [phone]. Call and let us know when your appointment is.  Continue 2 lasix tabs twice a day

## 2021-02-03 NOTE — Progress Notes (Signed)
I personally was present during the history, physical exam, and medical decision-making activities of this service and have verified that the service and findings are accurately documented in the student's note.  Of note: no pitting edema or rash or warmth of L LL. Several R LL small hematoma/pustules scattered over ankle and dorsum of foot. Tense pitting + nonpitting edema R LL below knee with some erythema over dorsum of R foot and pretibial surface RLL.  Signed:  Crissie Sickles, MD           02/03/2021

## 2021-02-03 NOTE — Progress Notes (Addendum)
OFFICE VISIT  02/03/2021  CC:  Chief Complaint  Patient presents with   Follow-up    R leg infection     HPI:    Patient is a 88 y.o. female who presents for 1 wk f/u R leg edema and infection. A/P as of last visit: "1 right lower leg lymphedema, recurrent superficial ulcerations and left lower leg, recurrent cellulitis in left lower leg. This is in the setting of chronic renal insufficiency stage III. Start Omnicef 300 mg twice a day x7 days. Continue Lasix 60 mg twice a day. Refer to vein and vascular specialists in [place]. Electrolytes and creatinine today.   #2 left hip pain.  History of left hip fracture about 6 months ago, is status post IM nail surgery. Her orthopedist has released her. Home health PT ended quite a while ago. I think her current pain is muscular--likely glut med and/or GT bursitis.  Also could have piriformis syndrome--she has to sit all day either in her lift chair or wheelchair.  Rarely walking these days. Encouraged her to use her Vicodin 1-2 tabs 3 times a day as needed.  #180 sent in today. Home health PT will be reordered."  INTERIM HX: Patient comes in with complaints of blisters covering her foot that ooze yellow and red material that first appeared this past Saturday (01/29/21). Patient received a 7 day course of Omnicef 300 mg BID which she feels has not been helping much due to continuous oozing from her blister sites. She currently is no longer in PT and has refrained from walking due to increased occurrence of blisters. Ambulation is currently a struggle for her. Patient reports controlled Left hip pain (post-hip replacement surgery) with 1 Vicodin 3 times a day. She reports that she has not heard from the vascular clinic to which she was referred last visit.   ROS negative for cough, congestion, fever, and night sweats. ROS is positive for pain in the blisters on her foot and trouble ambulating. Appetite  is good.  Renal function returned pretty stable last visit, I recommended she take an extra half tab of Lasix twice a day at that time (total dose of 46m bid)--she started doing this yesterday.  Past Medical History:  Diagnosis Date   Anemia of chronic disease    Anticoagulation adequate, Eliquis 10/16/2014   Anxiety    clonaz helps this AND her breathing   Cholelithiasis without cholecystitis 07/2020   noted on CT abd/pelv at [facility]   Chronic combined systolic and diastolic congestive heart failure ([place] 048/88/9169  Complicated by atrial fibrillation; Echo 07/23/14: mild LVH, EF 50-55%, Gr 2 DD.;  Echo May 15, 2019: EF 35 to 40%, GRII DD.  (In setting of CHF exacerbation, class III)-> baseline class II symptoms. 09/2019 EF 55-60%, nl wall motion and LV fxn, grd II DD, mod MR.   Chronic renal insufficiency, stage III (moderate) (HCC)    GFR @ 50   COPD (chronic obstructive pulmonary disease) ([place]    recently noted on Xray, does not have any problems   Diverticulosis    Follicular lymphoma ([place]    Non Hodgkins B cell lymphoma; s/p chemo summer 2016--in remission still as of 09/2018: continue obs q 629mo Hiatal hernia 07/2020   moderate size-[facility]     06/2014   HTN (hypertension)    Hypothyroidism    IFG (impaired fasting glucose)  Lip cancer    Dr. Janace Hoard excised this: invasive SCC--no sign of cancer at ENT f/u 10/2015   Microcytic anemia    transfused 3 U total in hosp 07/2014   Obesity (BMI 30-39.9)    Pancytopenia due to antineoplastic chemotherapy ([place])    Persistent atrial fibrillation ([place])    With RVR; elec cardioversion 11/30/14.  On Amio since 10/2014; Dr. Lenna Gilford following her from pulm standpoint regarding this med.   Positive occult stool blood test 08/08/2014   Endoscopies ok 08/2014   Primary osteoarthritis of both knees 04/2016   Dr. Gladstone Lighter: bone on bone--to get euflexxa to both knees.    Protein calorie malnutrition ([place]) 2022   Pulmonary hypertension due to left heart disease ([place])    RLCP 05/19/2019: EF 35-40%. D1 50%, RI 70%, dLM-LAD 30%.  mPAP 43 mmHg - (62/19 mmHg), PCWP  28 mmHg (V wave 41 mmHg) LVEDP 34 mmHg   Pulmonary metastases (HCC) 07/24/2014   S/p left hip fracture 05/2020   ORIF   Vitamin B 12 deficiency    Vitamin D deficiency    dose increased to 50 K U tab TWICE weekly 04/2017    Past Surgical History:  Procedure Laterality Date   ABDOMINAL HYSTERECTOMY  1978   BONE MARROW BIOPSY  07/24/2014   BREAST BIOPSY     CARDIOVERSION N/A 10/16/2014   Procedure: CARDIOVERSION;  Surgeon: Lelon Perla, MD;  Location: [facility] ENDOSCOPY;  Service: Cardiovascular;  Laterality: N/A;   CARDIOVERSION N/A 11/30/2014   Procedure: CARDIOVERSION;  Surgeon: Larey Dresser, MD;  Location: [place];  Service: Cardiovascular;  Laterality: N/A;   CATARACT EXTRACTION  2020   OU: Dr. Katy Fitch   COLONOSCOPY  09/23/2014   small hemorrhoids, otherwise normal (performed for IDA and heme+ stool)   COLONOSCOPY     [place]     May 2022   INTRAMEDULLARY (IM) NAIL INTERTROCHANTERIC Left 05/25/2020   Procedure: INTRAMEDULLARY (IM) NAIL INTERTROCHANTRIC;  Surgeon: Erle Crocker, MD;  Location: [place];  Service: Orthopedics;  Laterality: Left;   MASS EXCISION Left 07/15/2015   Left lower lip mass--invasive SCC w/negative margins.  Procedure: EXCISION MASS;  Surgeon: Melissa Montane, MD;  Location: [place];  Service: ENT;  Laterality: Left;  Wedge excision left lower lip mass   NM MYOVIEW LTD  10/29/2014   medium size mild surgery defect in the mid anterior and apical anterior location suggestive of breast attenuation. No reversibility. LOW RISK   PFTs  02/2015   Restriction with diffusion defect: cardiologist referred her to pulm to help interpret PFTs and decide whether she has amiodarone toxicity   RIGHT/LEFT HEART CATH AND CORONARY ANGIOGRAPHY N/A  05/19/2019   Procedure: RIGHT/LEFT HEART CATH AND CORONARY ANGIOGRAPHY;  Surgeon: Troy Sine, MD;  Location: [place] CV LAB;; EF 35-40%. D1 50%, RI 70%, dLM-LAD 30%.  mPAP 43 mmHg - (62/19 mmHg), PCWP  28 mmHg (V wave 41 mmHg) LVEDP 34 mmHg   TEE WITHOUT CARDIOVERSION N/A 10/16/2014   Procedure: TRANSESOPHAGEAL ECHOCARDIOGRAM (TEE);  Surgeon: Lelon Perla, MD;  Location: [facility] ENDOSCOPY;  Service: Cardiovascular;  Laterality: N/A;   TRANSTHORACIC ECHOCARDIOGRAM  10/09/2019   EF 55 to 60%.  GRII DD.  No R WMA.  Normal PAP and RAP.  Severe LA dilation.  Mild RA dilation.  Moderate MR (likely related to annular dilation in the setting of severe LAE.  Mild aortic valve sclerosis-no stenosis.   TRANSTHORACIC ECHOCARDIOGRAM  05/15/2019   Acute CHF exacerbation:  EF 35-40%.  Moderately reduced function.  (Previous EF was 55 to 60%) global HK.  GRII DD.  Mild LA dilation.  Normal RV size.   UPPER GI ENDOSCOPY  09/23/2014   small hiatus hernia, nodules in stomach biopsied (chronic active erosive atrophic gastritis with intestinal metaplasia--no dysplasia or malignancy) otherwise normal    Outpatient Medications Prior to Visit  Medication Sig Dispense Refill   acetaminophen (TYLENOL) 500 MG tablet Take 1,000 mg by mouth every 6 (six) hours as needed for moderate pain or headache.     amiodarone (PACERONE) 200 MG tablet TAKE ONE TABLET BY MOUTH EVERY DAY. (Patient taking differently: Take 200 mg by mouth daily.) 90 tablet 3   cyanocobalamin (,VITAMIN B-12,) 1000 MCG/ML injection INJECT 1 ML INTO THE SKIN EVERY 30 DAYS. 3 mL 3   diclofenac Sodium (VOLTAREN) 1 % GEL APPLY 2 G TOPICALLY 4 (FOUR) TIMES DAILY. APPLY TO LE JOINTS THAT ARE IN PAIN. 300 g 1   ELIQUIS 5 MG TABS tablet TAKE 1 TABLET BY MOUTH TWICE A DAY 60 tablet 8   ferrous sulfate 325 (65 FE) MG tablet Take 1 tablet (325 mg total) by mouth 2 (two) times daily with a meal. 30 tablet 1   fish oil-omega-3 fatty acids 1000 MG capsule Take 2 g  by mouth daily.     fluticasone (CUTIVATE) 0.05 % cream Apply to affected area of lower legs bid prn 60 g 2   furosemide (LASIX) 40 MG tablet Take 1 tablet (40 mg total) by mouth 2 (two) times daily. Appointment Required For Further Refills [phone] (Patient taking differently: Take 60 mg by mouth 2 (two) times daily. Appointment Required For Further Refills [phone]) 180 tablet 1   HYDROcodone-acetaminophen (NORCO/VICODIN) 5-325 MG tablet 1-2 tabs po tid prn pain 180 tablet 0   hydrOXYzine (VISTARIL) 25 MG capsule 1-2 tabs po qhs for sleep 60 capsule 3   levothyroxine (SYNTHROID) 100 MCG tablet TAKE 1 TABLET BY MOUTH EVERY DAY BEFORE BREAKFAST (Patient taking differently: Take 100 mcg by mouth daily before breakfast.) 90 tablet 1   OXYGEN Inhale into the lungs. 1Lt at bedtime  as needed     Potassium Chloride ER 20 MEQ TBCR TAKE 1 TABLET BY MOUTH EVERY DAY 90 tablet 1   rosuvastatin (CRESTOR) 20 MG tablet TAKE 1 TABLET BY MOUTH EVERY DAY 90 tablet 3   sacubitril-valsartan (ENTRESTO) 24-26 MG Take 1 tablet by mouth 2 (two) times daily. 60 tablet 11   Vitamin D, Ergocalciferol, (DRISDOL) 1.25 MG (50000 UNIT) CAPS capsule Take 1 capsule (50,000 Units total) by mouth 2 (two) times a week. 24 capsule 3   cefdinir (OMNICEF) 300 MG capsule Take 1 capsule (300 mg total) by mouth 2 (two) times daily. 14 capsule 0   No facility-administered medications prior to visit.    Allergies  Allergen Reactions   Aspirin Itching   Calcium Alginate Other (See Comments)    Excessive burning   Morphine     Other reaction(s): Hallucinations   Iodinated Contrast Media Rash and Other (See Comments)    CT Contrast    ROS As per HPI  PE: Vitals with BMI 02/03/2021 01/28/2021 01/03/2021  Height '5\' 6"'  '5\' 6"'  '5\' 6"'   Weight (No Data) (No Data) -  BMI - - -  Systolic 423 536 144  Diastolic 57 68 58  Pulse 63 80 64     Physical Exam Constitutional:      Appearance:  Normal appearance.  Musculoskeletal:      Right lower leg: No swelling or tenderness. 3+ Pitting Edema present.     Left lower leg: No tenderness. Pitting Edema present.     Right ankle: Swelling present. Tenderness present over the lateral malleolus.     Left ankle: Normal.     Right foot: Swelling and tenderness present.     Comments: Blister locations: 2 located at 5th distal phalanges, one ofn 5th metatarsal, one on posterior medial malleolus  Neurological:     Mental Status: She is alert.      LABS:  Last CBC Lab Results  Component Value Date   WBC 6.1 12/10/2020   HGB 12.3 12/10/2020   HCT 37.6 12/10/2020   MCV 95.2 12/10/2020   MCH 31.1 12/10/2020   RDW 12.2 12/10/2020   PLT 128 (L) 16/55/3748   Last metabolic panel Lab Results  Component Value Date   GLUCOSE 92 01/28/2021   NA 142 01/28/2021   K 4.0 01/28/2021   CL 98 01/28/2021   CO2 30 01/28/2021   BUN 22 01/28/2021   CREATININE 1.10 (H) 01/28/2021   GFRNONAA >60 12/10/2020   CALCIUM 9.2 01/28/2021   PHOS 4.7 (H) 05/21/2019   PROT 5.5 (L) 09/10/2020   ALBUMIN 3.2 (L) 05/30/2020   LABGLOB 3.2 07/23/2014   AGRATIO 0.8 07/23/2014   BILITOT 0.7 09/10/2020   ALKPHOS 30 (L) 05/30/2020   AST 19 09/10/2020   ALT 13 09/10/2020   ANIONGAP 8 12/10/2020   IMPRESSION AND PLAN:  Non-toxic appearing female who presents today for R leg edema and infection, along with L hip pain.   1. Right lower leg lymphedema, recurrent superficial ulcerations and left lower leg, recurrent cellulitis in left lower leg. This is in the setting of chronic renal insufficiency stage III. Patient had new blisters appear over the weekend that were draining pus and blood. Was prescribed Omnicef 300 mg twice a day x7 days. Will continue Omnicef but will extend course for 10 more days 367m BID. Increased Lasix to 449mBID to help combat leg edema.  Patient instructed [place]. Discussed possibility of doing more imaging--- ?  Rule out pelvic vein thrombosis and/or lymphoma recurrence that could be impairing pelvic venous flow.  Will consult radiologist for best imaging given her chronic renal insufficiency and allergy to CT contrast (says full body blister rash occurs).   #2 left hip pain, controlled with Vicodin TID PRN.  History of left hip fracture about 6 months ago, is status post IM nail surgery. Her orthopedist has released her. Current pain is muscular, possibly some Glut med/min tendonitis or troch bursitis. Home health PT ended quite a while ago. Will refer to PT again due to patient having trouble ambulating.  An After Visit Summary was printed and given to the patient.  FOLLOW UP:  to be determined based on results of today's lab, the date of her initial vein clinic appt, and my decision of any further imaging tests.  HePhil Dopp MS3   Signed:  PhCrissie SicklesMD           02/03/2021

## 2021-02-03 NOTE — Telephone Encounter (Signed)
FYI. Please see below.   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 It's only one [facility] that takes her Insurance and Right now they dont have staff...   [11:47 AM] Amanda Sellers We are not in network with [facility] and they end up owing money with [place] so we don't like to take them when others can see them w/o a copay :( This was the most recent message I got yesterday. I'm waiting on one more HH to respond

## 2021-02-03 NOTE — Telephone Encounter (Signed)
[  11:15 AM] Amanda Sellers Pls ask HH to restart PT for [facility], L hip pain, unsteady gait, generalized weakness.

## 2021-02-04 DIAGNOSIS — I38 Endocarditis, valve unspecified: Secondary | ICD-10-CM | POA: Diagnosis not present

## 2021-02-04 DIAGNOSIS — Z79891 Long term (current) use of opiate analgesic: Secondary | ICD-10-CM | POA: Diagnosis not present

## 2021-02-04 DIAGNOSIS — I130 Hypertensive heart and chronic kidney disease with heart failure and stage 1 through stage 4 chronic kidney disease, or unspecified chronic kidney disease: Secondary | ICD-10-CM | POA: Diagnosis not present

## 2021-02-04 DIAGNOSIS — I5032 Chronic diastolic (congestive) heart failure: Secondary | ICD-10-CM | POA: Diagnosis not present

## 2021-02-04 DIAGNOSIS — I480 Paroxysmal atrial fibrillation: Secondary | ICD-10-CM | POA: Diagnosis not present

## 2021-02-04 DIAGNOSIS — Z7901 Long term (current) use of anticoagulants: Secondary | ICD-10-CM | POA: Diagnosis not present

## 2021-02-04 DIAGNOSIS — Z8781 Personal history of (healed) traumatic fracture: Secondary | ICD-10-CM | POA: Diagnosis not present

## 2021-02-04 DIAGNOSIS — I872 Venous insufficiency (chronic) (peripheral): Secondary | ICD-10-CM | POA: Diagnosis not present

## 2021-02-04 DIAGNOSIS — N189 Chronic kidney disease, unspecified: Secondary | ICD-10-CM | POA: Diagnosis not present

## 2021-02-04 DIAGNOSIS — Z9981 Dependence on supplemental oxygen: Secondary | ICD-10-CM | POA: Diagnosis not present

## 2021-02-04 DIAGNOSIS — L02415 Cutaneous abscess of right lower limb: Secondary | ICD-10-CM | POA: Diagnosis not present

## 2021-02-05 ENCOUNTER — Encounter: Payer: Self-pay | Admitting: Family Medicine

## 2021-02-07 ENCOUNTER — Other Ambulatory Visit: Payer: Self-pay | Admitting: Family Medicine

## 2021-02-07 DIAGNOSIS — R590 Localized enlarged lymph nodes: Secondary | ICD-10-CM

## 2021-02-07 MED ORDER — FUROSEMIDE 40 MG PO TABS: ORAL_TABLET | ORAL | 1 refills | Status: DC

## 2021-02-07 NOTE — Telephone Encounter (Signed)
Pt advised refill sent. °

## 2021-02-07 NOTE — Telephone Encounter (Signed)
Pt's last appt was 02/03/21. Please fill, if appropriate. 11/29/20(180,1)

## 2021-02-08 ENCOUNTER — Encounter: Payer: Self-pay | Admitting: Family Medicine

## 2021-02-08 NOTE — Telephone Encounter (Signed)
Amanda Sellers, Spoke with daughter, [name]. Pt has appt Monday @ 10am with vein clinic and a-fib clinic on Thursday. Imaging appt was cancelled because it would be too much on pt this week. If you would like them to still have imaging complete, please advise.

## 2021-02-09 ENCOUNTER — Ambulatory Visit (HOSPITAL_COMMUNITY): Payer: PPO

## 2021-02-09 NOTE — Telephone Encounter (Signed)
That is fine. Reassure them that there is no rush on this, I do not want to overstress her. If she could reschedule the ultrasound at [facility] for next week or the week after that would be fine.

## 2021-02-10 ENCOUNTER — Ambulatory Visit (HOSPITAL_COMMUNITY): Payer: PPO | Admitting: Nurse Practitioner

## 2021-02-10 NOTE — Telephone Encounter (Signed)
Nothing further needed 

## 2021-02-11 ENCOUNTER — Telehealth: Payer: Self-pay | Admitting: Family Medicine

## 2021-02-11 NOTE — Telephone Encounter (Signed)
Okay for PT and v/o?

## 2021-02-11 NOTE — Telephone Encounter (Signed)
[facility] (home health) called in regards to pt They are needing physical therapy authorized.--KR  [facility] [phone]  Jennifer/[phone]- verbal from [facility]

## 2021-02-11 NOTE — Telephone Encounter (Signed)
Yes okay 

## 2021-02-14 ENCOUNTER — Telehealth: Payer: Self-pay | Admitting: Family Medicine

## 2021-02-14 DIAGNOSIS — L97219 Non-pressure chronic ulcer of right calf with unspecified severity: Secondary | ICD-10-CM | POA: Diagnosis not present

## 2021-02-14 DIAGNOSIS — I890 Lymphedema, not elsewhere classified: Secondary | ICD-10-CM | POA: Diagnosis not present

## 2021-02-14 DIAGNOSIS — R600 Localized edema: Secondary | ICD-10-CM | POA: Diagnosis not present

## 2021-02-14 NOTE — Telephone Encounter (Signed)
Approved v/o given to [facility] for PT

## 2021-02-17 ENCOUNTER — Emergency Department (HOSPITAL_COMMUNITY): Payer: PPO

## 2021-02-17 ENCOUNTER — Encounter (HOSPITAL_COMMUNITY): Payer: Self-pay | Admitting: *Deleted

## 2021-02-17 ENCOUNTER — Emergency Department (HOSPITAL_COMMUNITY)
Admission: EM | Admit: 2021-02-17 | Discharge: 2021-02-17 | Disposition: A | Discharge status: Home / Self Care | Payer: PPO | Attending: Emergency Medicine | Admitting: Emergency Medicine

## 2021-02-17 ENCOUNTER — Other Ambulatory Visit: Payer: Self-pay

## 2021-02-17 DIAGNOSIS — Z7901 Long term (current) use of anticoagulants: Secondary | ICD-10-CM | POA: Insufficient documentation

## 2021-02-17 DIAGNOSIS — W19XXXA Unspecified fall, initial encounter: Secondary | ICD-10-CM | POA: Diagnosis not present

## 2021-02-17 DIAGNOSIS — I10 Essential (primary) hypertension: Secondary | ICD-10-CM | POA: Diagnosis not present

## 2021-02-17 DIAGNOSIS — M25551 Pain in right hip: Secondary | ICD-10-CM

## 2021-02-17 DIAGNOSIS — Z96642 Presence of left artificial hip joint: Secondary | ICD-10-CM | POA: Diagnosis not present

## 2021-02-17 DIAGNOSIS — M25572 Pain in left ankle and joints of left foot: Secondary | ICD-10-CM | POA: Diagnosis not present

## 2021-02-17 DIAGNOSIS — S79911A Unspecified injury of right hip, initial encounter: Secondary | ICD-10-CM | POA: Diagnosis not present

## 2021-02-17 NOTE — ED Triage Notes (Signed)
Pt brought in by [facility] from home with c/o right hip pain after fall at 0300 this morning. EMS reports no rotation or shortening of right leg.

## 2021-02-17 NOTE — ED Notes (Signed)
Pt reports falling this morning around 0300 onto right hip, which she states is hurting her. Upon assessment it was noted that left leg is approx 1 inch shorter than right, but does not appear to be externally or internally rotated. CMS intact to both lower extremities. Peripheral pulses strong. RLE wrapped d/t increased edema and weeping of leg per pt. States this was wrapped at her doctors office. Pt appears to have more pain with movement in LLE upon EDP examination. Neuro WDL. Pt A/O x4. Denies any other injuries or hitting head. States she does take Elliquis

## 2021-02-17 NOTE — ED Notes (Signed)
Pt assisted using walker and wheelchair to transfer from bed to chair. Pt was able to bear weight appropriately on both legs. Some weakness noted bilaterally but pt did not report pain. She transferred with stand by assist. Daughter reports that she solely relies on lift chair to stand up and confirms that pt is transferring at baseline

## 2021-02-17 NOTE — ED Provider Notes (Signed)
[facility] EMERGENCY DEPARTMENT Provider Note  CSN: [id number] Arrival date & time: 02/17/21 1141  History Chief Complaint  Patient presents with   Hip Injury    Amanda Sellers is a 88 y.o. female brought to the ED via EMS who reports she had a fall around 3am this morning landing on her R hip. She is unsure how she fell but remembers calling for her son. She is complaining of R hip pain at rest. She had prior arthroplasty to her L hip. Denies head injury but she is on eliquis. .    Home Medications Prior to Admission medications   Medication Sig Start Date End Date Taking? Authorizing Provider  acetaminophen (TYLENOL) 500 MG tablet Take 1,000 mg by mouth every 6 (six) hours as needed for moderate pain or headache.    [provider]  amiodarone (PACERONE) 200 MG tablet TAKE ONE TABLET BY MOUTH EVERY DAY. Patient taking differently: Take 200 mg by mouth daily. 03/29/20   Leonie Man, MD  cefdinir (OMNICEF) 300 MG capsule Take 1 capsule (300 mg total) by mouth 2 (two) times daily. 02/03/21   McGowen, Adrian Blackwater, MD  cyanocobalamin (,VITAMIN B-12,) 1000 MCG/ML injection INJECT 1 ML INTO THE SKIN EVERY 30 DAYS. 07/27/20   McGowen, Adrian Blackwater, MD  diclofenac Sodium (VOLTAREN) 1 % GEL APPLY 2 G TOPICALLY 4 (FOUR) TIMES DAILY. APPLY TO LE JOINTS THAT ARE IN PAIN. 01/06/21   McGowen, Adrian Blackwater, MD  ELIQUIS 5 MG TABS tablet TAKE 1 TABLET BY MOUTH TWICE A DAY 01/25/21   Leonie Man, MD  ferrous sulfate 325 (65 FE) MG tablet Take 1 tablet (325 mg total) by mouth 2 (two) times daily with a meal. 07/29/14   Ladell Pier, MD  fish oil-omega-3 fatty acids 1000 MG capsule Take 2 g by mouth daily.    [provider]  fluticasone (CUTIVATE) 0.05 % cream Apply to affected area of lower legs bid prn 11/29/20   McGowen, Adrian Blackwater, MD  furosemide (LASIX) 40 MG tablet 2 tabs po bid 02/07/21   McGowen, Adrian Blackwater, MD  HYDROcodone-acetaminophen (NORCO/VICODIN) 5-325 MG tablet 1-2 tabs po tid  prn pain 01/28/21   McGowen, Adrian Blackwater, MD  hydrOXYzine (VISTARIL) 25 MG capsule 1-2 tabs po qhs for sleep 09/13/20   McGowen, Adrian Blackwater, MD  levothyroxine (SYNTHROID) 100 MCG tablet TAKE 1 TABLET BY [place] BREAKFAST Patient taking differently: Take 100 mcg by mouth daily before breakfast. 04/30/20   McGowen, Adrian Blackwater, MD  OXYGEN Inhale into the lungs. 1Lt at bedtime  as needed    [provider]  Potassium Chloride ER 20 MEQ TBCR TAKE 1 TABLET BY MOUTH EVERY DAY 10/21/20   McGowen, Adrian Blackwater, MD  rosuvastatin (CRESTOR) 20 MG tablet TAKE 1 TABLET BY MOUTH EVERY DAY 01/04/21   Leonie Man, MD  sacubitril-valsartan (ENTRESTO) 24-26 MG Take 1 tablet by mouth 2 (two) times daily. 03/12/20   Leonie Man, MD  Vitamin D, Ergocalciferol, (DRISDOL) 1.25 MG (50000 UNIT) CAPS capsule Take 1 capsule (50,000 Units total) by mouth 2 (two) times a week. 01/31/21   McGowen, Adrian Blackwater, MD     Allergies    Aspirin, Calcium alginate, Morphine, and Iodinated contrast media   Review of Systems   Review of Systems Please see HPI for pertinent positives and negatives  Physical Exam BP (!) 162/65 (BP Location: Left Arm)    Pulse 66    Temp 98.8  F (37.1 C) (Oral)    Resp 16    Ht 5\' 6"  (1.676 m)    Wt 87.5 kg    SpO2 95%    BMI 31.15 kg/m   Physical Exam Vitals and nursing note reviewed.  Constitutional:      Appearance: Normal appearance.  HENT:     Head: Normocephalic and atraumatic.     Nose: Nose normal.     Mouth/Throat:     Mouth: Mucous membranes are moist.  Eyes:     Extraocular Movements: Extraocular movements intact.     Conjunctiva/sclera: Conjunctivae normal.  Cardiovascular:     Rate and Rhythm: Normal rate.  Pulmonary:     Effort: Pulmonary effort is normal.     Breath sounds: Normal breath sounds.  Abdominal:     General: Abdomen is flat.     Palpations: Abdomen is soft.     Tenderness: There is no abdominal tenderness.  Musculoskeletal:        General:  Swelling present.     Cervical back: Neck supple.     Comments: RLE in compression wrap; she has shortening of the L leg and has pain with ROM of both legs although seems to be more painful with the L leg than the R. Despite that, at rest she reports the R is more painful. She is NVI on both sides.   Skin:    General: Skin is warm and dry.  Neurological:     General: No focal deficit present.     Mental Status: She is alert.  Psychiatric:        Mood and Affect: Mood normal.    ED Results / Procedures / Treatments   EKG None  Procedures Procedures  Medications Ordered in the ED Medications - No data to display  Initial Impression and Plan  Patient with hip pain after a fall but inconsistent exam for laterality of her pain. Will check bilateral hip xrays to start.   ED Course   Clinical Course as of 02/17/21 1430  Thu Feb 17, 2021  1359 Xrays reviewed and neg for fracture. Will ensure patient can stand and pivot (she does not walk at baseline) and if able to function per her normal, plan discharge home. Daughter at bedside is in agreement. She states patient gets around mostly in a wheel chair but is able to stand and move to recliner etc at home.  [CS]  1427 Patient able to stand and get to a chair without difficulty. Considered further evaluation including CT and/or admission as she is able to get up as her usual baselineand wants to go home will defer that for now. Recommend outpatient follow up.  RTED for any other concerns.  [CS]    Clinical Course User Index [CS] Truddie Hidden, MD     MDM Rules/Calculators/A&P Medical Decision Making Problems Addressed: Fall, initial encounter: acute illness or injury that poses a threat to life or bodily functions Hip pain, acute, right: acute illness or injury that poses a threat to life or bodily functions  Amount and/or Complexity of Data Reviewed Radiology: ordered and independent interpretation performed. Decision-making  details documented in ED Course.  Risk Decision regarding hospitalization.    Final Clinical Impression(s) / ED Diagnoses Final diagnoses:  Fall, initial encounter  Hip pain, acute, right    Rx / DC Orders ED Discharge Orders     None        Truddie Hidden, MD 02/17/21 1430

## 2021-02-20 DIAGNOSIS — C8590 Non-Hodgkin lymphoma, unspecified, unspecified site: Secondary | ICD-10-CM | POA: Diagnosis not present

## 2021-02-20 DIAGNOSIS — K449 Diaphragmatic hernia without obstruction or gangrene: Secondary | ICD-10-CM | POA: Diagnosis not present

## 2021-02-20 DIAGNOSIS — N183 Chronic kidney disease, stage 3 unspecified: Secondary | ICD-10-CM | POA: Diagnosis not present

## 2021-02-20 DIAGNOSIS — K5790 Diverticulosis of intestine, part unspecified, without perforation or abscess without bleeding: Secondary | ICD-10-CM | POA: Diagnosis not present

## 2021-02-20 DIAGNOSIS — I130 Hypertensive heart and chronic kidney disease with heart failure and stage 1 through stage 4 chronic kidney disease, or unspecified chronic kidney disease: Secondary | ICD-10-CM | POA: Diagnosis not present

## 2021-02-20 DIAGNOSIS — I4819 Other persistent atrial fibrillation: Secondary | ICD-10-CM | POA: Diagnosis not present

## 2021-02-20 DIAGNOSIS — E669 Obesity, unspecified: Secondary | ICD-10-CM | POA: Diagnosis not present

## 2021-02-20 DIAGNOSIS — I872 Venous insufficiency (chronic) (peripheral): Secondary | ICD-10-CM | POA: Diagnosis not present

## 2021-02-20 DIAGNOSIS — Z993 Dependence on wheelchair: Secondary | ICD-10-CM | POA: Diagnosis not present

## 2021-02-20 DIAGNOSIS — L97818 Non-pressure chronic ulcer of other part of right lower leg with other specified severity: Secondary | ICD-10-CM | POA: Diagnosis not present

## 2021-02-20 DIAGNOSIS — S7292XD Unspecified fracture of left femur, subsequent encounter for closed fracture with routine healing: Secondary | ICD-10-CM | POA: Diagnosis not present

## 2021-02-20 DIAGNOSIS — L03116 Cellulitis of left lower limb: Secondary | ICD-10-CM | POA: Diagnosis not present

## 2021-02-20 DIAGNOSIS — C009 Malignant neoplasm of lip, unspecified: Secondary | ICD-10-CM | POA: Diagnosis not present

## 2021-02-20 DIAGNOSIS — I5042 Chronic combined systolic (congestive) and diastolic (congestive) heart failure: Secondary | ICD-10-CM | POA: Diagnosis not present

## 2021-02-20 DIAGNOSIS — Z7901 Long term (current) use of anticoagulants: Secondary | ICD-10-CM | POA: Diagnosis not present

## 2021-02-20 DIAGNOSIS — Z90710 Acquired absence of both cervix and uterus: Secondary | ICD-10-CM | POA: Diagnosis not present

## 2021-02-20 DIAGNOSIS — E785 Hyperlipidemia, unspecified: Secondary | ICD-10-CM | POA: Diagnosis not present

## 2021-02-20 DIAGNOSIS — J449 Chronic obstructive pulmonary disease, unspecified: Secondary | ICD-10-CM | POA: Diagnosis not present

## 2021-02-20 DIAGNOSIS — Z9981 Dependence on supplemental oxygen: Secondary | ICD-10-CM | POA: Diagnosis not present

## 2021-02-20 DIAGNOSIS — E039 Hypothyroidism, unspecified: Secondary | ICD-10-CM | POA: Diagnosis not present

## 2021-02-20 DIAGNOSIS — D631 Anemia in chronic kidney disease: Secondary | ICD-10-CM | POA: Diagnosis not present

## 2021-02-20 DIAGNOSIS — M170 Bilateral primary osteoarthritis of knee: Secondary | ICD-10-CM | POA: Diagnosis not present

## 2021-02-20 DIAGNOSIS — F419 Anxiety disorder, unspecified: Secondary | ICD-10-CM | POA: Diagnosis not present

## 2021-02-21 ENCOUNTER — Other Ambulatory Visit: Payer: Self-pay | Admitting: Family Medicine

## 2021-02-22 ENCOUNTER — Ambulatory Visit (HOSPITAL_COMMUNITY): Payer: PPO | Admitting: Nurse Practitioner

## 2021-02-22 DIAGNOSIS — I87391 Chronic venous hypertension (idiopathic) with other complications of right lower extremity: Secondary | ICD-10-CM | POA: Diagnosis not present

## 2021-02-22 DIAGNOSIS — M7989 Other specified soft tissue disorders: Secondary | ICD-10-CM | POA: Diagnosis not present

## 2021-02-22 DIAGNOSIS — I87331 Chronic venous hypertension (idiopathic) with ulcer and inflammation of right lower extremity: Secondary | ICD-10-CM | POA: Diagnosis not present

## 2021-02-23 ENCOUNTER — Other Ambulatory Visit: Payer: Self-pay

## 2021-02-24 ENCOUNTER — Encounter: Payer: Self-pay | Admitting: Family Medicine

## 2021-02-24 ENCOUNTER — Telehealth: Payer: Self-pay

## 2021-02-24 ENCOUNTER — Ambulatory Visit (INDEPENDENT_AMBULATORY_CARE_PROVIDER_SITE_OTHER): Payer: PPO | Admitting: Family Medicine

## 2021-02-24 VITALS — BP 114/59 | HR 88 | Temp 98.0°F | Ht 66.0 in

## 2021-02-24 DIAGNOSIS — R609 Edema, unspecified: Secondary | ICD-10-CM

## 2021-02-24 DIAGNOSIS — Z8781 Personal history of (healed) traumatic fracture: Secondary | ICD-10-CM

## 2021-02-24 DIAGNOSIS — M79604 Pain in right leg: Secondary | ICD-10-CM | POA: Diagnosis not present

## 2021-02-24 DIAGNOSIS — M79605 Pain in left leg: Secondary | ICD-10-CM

## 2021-02-24 DIAGNOSIS — M25551 Pain in right hip: Secondary | ICD-10-CM | POA: Diagnosis not present

## 2021-02-24 MED ORDER — HYDROCODONE-ACETAMINOPHEN 5-325 MG PO TABS: ORAL_TABLET | ORAL | 0 refills | Status: DC

## 2021-02-24 NOTE — Progress Notes (Addendum)
OFFICE VISIT  02/24/2021  CC:  Chief Complaint  Patient presents with   Follow-up    RCI   HPI:    Patient is a 88 y.o. female who presents for 3 wk f/u asymmetric LL edema. A/P as of last visit: "1. Right lower leg lymphedema, recurrent superficial ulcerations and left lower leg, recurrent cellulitis in left lower leg. This is in the setting of chronic renal insufficiency stage III. Patient had new blisters appear over the weekend that were draining pus and blood. Was prescribed Omnicef 300 mg twice a day x7 days. Will continue Omnicef but will extend course for 10 more days 327m BID. Increased Lasix to 484mBID to help combat leg edema.  [place]. Discussed possibility of doing more imaging--- ?  Rule out pelvic vein thrombosis and/or lymphoma recurrence that could be impairing pelvic venous flow.  Will consult radiologist for best imaging given her chronic renal insufficiency and allergy to CT contrast (says full body blister rash occurs).   #2 left hip pain, controlled with Vicodin TID PRN.  History of left hip fracture about 6 months ago, is status post IM nail surgery. Her orthopedist has released her. Current pain is muscular, possibly some Glut med/min tendonitis or troch bursitis. Home health PT ended quite a while ago. Will refer to PT again due to patient having trouble ambulating."  INTERIM HX: Patient was seen at the vein clinic last tuesday (02/22/21) for follow up for her R leg edema. Patients leg was wrapped and she was instructed to not take it off until her next appointment scheduled next tuesday (03/01/21). Per patient, the physician at the clinic said her leg looks like its healing better and shrinking in size. The patient agreed with the physicians comments. She does not experience any outstanding pain, discomfort, fevers, or chills. Patient  reports using Vicodin every 4 hours for pain. She has yet to start PT for her Physical Therapist said that the patient would need to be cleared by Dr. McErnestine Conrador exercise due to her lung exam (heard crackles per patient)   I did order R groin/thigh ultrasound 02/07/21 to further eval hx of R groin adenopathy noted on 11/2020 venous doppler u/s R leg---not scheduled. Home health PT was not able to be arranged due to agencies declining her insurance.   Pt went to ED 02/17/21 for right hip pain s/p fall, plain film imaging of hips and pelvis neg for acute issue. Patient was found to be at baseline level of functioning. No further hip pain.  Past Medical History:  Diagnosis Date   Anemia of chronic disease    Anticoagulation adequate, Eliquis 10/16/2014   Anxiety    clonaz helps this AND her breathing   Cholelithiasis without cholecystitis 07/2020   noted on CT abd/pelv at [facility]   Chronic combined systolic and diastolic congestive heart failure ([id number] Complicated by atrial fibrillation; Echo 07/23/14: mild LVH, EF 50-55%, Gr 2 DD.;  Echo May 15, 2019: EF 35 to 40%, GRII DD.  (In setting of CHF exacerbation, class III)-> baseline class II symptoms. 09/2019 EF 55-60%, nl wall motion and LV fxn, grd II DD, mod MR.   Chronic renal insufficiency, stage III (moderate) (HCC)    GFR @ 50   COPD (chronic obstructive pulmonary disease) ([place]   recently noted on Xray, does not have any problems   Diverticulosis  Follicular lymphoma ([place])    Non Hodgkins B cell lymphoma; s/p chemo summer 2016--in remission still as of 09/2018: continue obs q 25mo  Hiatal hernia 07/2020   moderate size-[place]     06/2014   HTN (hypertension)    Hypothyroidism    IFG (impaired fasting glucose)    Lip cancer    Dr. BJanace Hoardexcised this: invasive SCC--no sign of cancer at ENT f/u 10/2015   Microcytic anemia    transfused 3 U total  in hosp 07/2014   Obesity (BMI 30-39.9)    Pancytopenia due to antineoplastic chemotherapy ([place]    Persistent atrial fibrillation ([place]    With RVR; elec cardioversion 11/30/14.  On Amio since 10/2014; Dr. NLenna Gilfordfollowing her from pulm standpoint regarding this med.   Positive occult stool blood test 08/08/2014   Endoscopies ok 08/2014   Primary osteoarthritis of both knees 04/2016   Dr. GGladstone Lighter bone on bone--to get euflexxa to both knees.   Protein calorie malnutrition ([place] 2022   Pulmonary hypertension due to left heart disease ([place]    RLCP 05/19/2019: EF 35-40%. D1 50%, RI 70%, dLM-LAD 30%.  mPAP 43 mmHg - (62/19 mmHg), PCWP  28 mmHg (V wave 41 mmHg) LVEDP 34 mmHg   Pulmonary metastases (HCC) 07/24/2014   S/p left hip fracture 05/2020   ORIF   Vitamin B 12 deficiency    Vitamin D deficiency    dose increased to 50 K U tab TWICE weekly 04/2017    Past Surgical History:  Procedure Laterality Date   ABDOMINAL HYSTERECTOMY  1978   BONE MARROW BIOPSY  07/24/2014   BREAST BIOPSY     CARDIOVERSION N/A 10/16/2014   Procedure: CARDIOVERSION;  Surgeon: BLelon Perla MD;  [facility];  Service: Cardiovascular;  Laterality: N/A;   CARDIOVERSION N/A 11/30/2014   Procedure: CARDIOVERSION;  Surgeon: DLarey Dresser MD;  [place]  Service: Cardiovascular;  Laterality: N/A;   CATARACT EXTRACTION  2020   OU: Dr. GKaty Fitch  COLONOSCOPY  09/23/2014   small hemorrhoids, otherwise normal (performed for IDA and heme+ stool)   [place]     [place]    May 20[place]   INTRAMEDULLARY (IM) NAIL INTERTROCHANTERIC Left 05/25/2020   Procedure: INTRAMEDULLARY (IM) NAIL INTERTROCHANTRIC;  Surgeon: AErle Crocker MD;  [place]  Service: Orthopedics;  Laterality: Left;   MASS EXCISION Left 07/15/2015   Left lower lip mass--invasive SCC w/negative margins.  Procedure: EXCISION MASS;  Surgeon: JMelissa Montane MD;  [place]  Service:  ENT;  Laterality: Left;  Wedge excision left lower lip mass   NM MYOVIEW LTD  10/29/2014   medium size mild surgery defect in the mid anterior and apical anterior location suggestive of breast attenuation. No reversibility. LOW RISK   PFTs  02/2015   Restriction with diffusion defect: cardiologist referred her to pulm to help interpret PFTs and decide whether she has amiodarone toxicity   RIGHT/LEFT HEART CATH AND CORONARY ANGIOGRAPHY N/A 05/19/2019   Procedure: RIGHT/LEFT HEART CATH AND CORONARY ANGIOGRAPHY;  Surgeon: KTroy Sine MD;  [place];; EF 35-40%. D1 50%, RI 70%, dLM-LAD 30%.  mPAP 43 mmHg - (62/19 mmHg), PCWP  28 mmHg (V wave 41 mmHg) LVEDP 34 mmHg   TEE WITHOUT CARDIOVERSION N/A 10/16/2014   Procedure: TRANSESOPHAGEAL ECHOCARDIOGRAM (TEE);  Surgeon: BLelon Perla  MD;  [place];  Service: Cardiovascular;  Laterality: N/A;   TRANSTHORACIC ECHOCARDIOGRAM  10/09/2019   EF 55 to 60%.  GRII DD.  No R WMA.  Normal PAP and RAP.  Severe LA dilation.  Mild RA dilation.  Moderate MR (likely related to annular dilation in the setting of severe LAE.  Mild aortic valve sclerosis-no stenosis.   TRANSTHORACIC ECHOCARDIOGRAM  05/15/2019   Acute CHF exacerbation:  EF 35-40%.  Moderately reduced function.  (Previous EF was 55 to 60%) global HK.  GRII DD.  Mild LA dilation.  Normal RV size.   UPPER GI ENDOSCOPY  09/23/2014   small hiatus hernia, nodules in stomach biopsied (chronic active erosive atrophic gastritis with intestinal metaplasia--no dysplasia or malignancy) otherwise normal    Outpatient Medications Prior to Visit  Medication Sig Dispense Refill   acetaminophen (TYLENOL) 500 MG tablet Take 1,000 mg by mouth every 6 (six) hours as needed for moderate pain or headache.     amiodarone (PACERONE) 200 MG tablet TAKE ONE TABLET BY MOUTH EVERY DAY. (Patient taking differently: Take 200 mg by mouth daily.) 90 tablet 3   cyanocobalamin (,VITAMIN B-12,) 1000  MCG/ML injection INJECT 1 ML INTO THE SKIN EVERY 30 DAYS. 3 mL 3   diclofenac Sodium (VOLTAREN) 1 % GEL APPLY 2 G TOPICALLY 4 (FOUR) TIMES DAILY. APPLY TO LE JOINTS THAT ARE IN PAIN. 300 g 1   ELIQUIS 5 MG TABS tablet TAKE 1 TABLET BY MOUTH TWICE A DAY 60 tablet 8   ferrous sulfate 325 (65 FE) MG tablet Take 1 tablet (325 mg total) by mouth 2 (two) times daily with a meal. 30 tablet 1   fish oil-omega-3 fatty acids 1000 MG capsule Take 2 g by mouth daily.     fluticasone (CUTIVATE) 0.05 % cream Apply to affected area of lower legs bid prn 60 g 2   furosemide (LASIX) 40 MG tablet 2 tabs po bid 120 tablet 1   hydrOXYzine (VISTARIL) 25 MG capsule TAKE 1-2 CAP BY MOUTH AT BED TIME FOR SLEEP 60 capsule 1   levothyroxine (SYNTHROID) 100 MCG tablet TAKE 1 TABLET BY MOUTH EVERY DAY BEFORE BREAKFAST (Patient taking differently: Take 100 mcg by mouth daily before breakfast.) 90 tablet 1   OXYGEN Inhale into the lungs. 1Lt at bedtime  as needed     Potassium Chloride ER 20 MEQ TBCR TAKE 1 TABLET BY MOUTH EVERY DAY 90 tablet 1   rosuvastatin (CRESTOR) 20 MG tablet TAKE 1 TABLET BY MOUTH EVERY DAY 90 tablet 3   sacubitril-valsartan (ENTRESTO) 24-26 MG Take 1 tablet by mouth 2 (two) times daily. 60 tablet 11   Vitamin D, Ergocalciferol, (DRISDOL) 1.25 MG (50000 UNIT) CAPS capsule Take 1 capsule (50,000 Units total) by mouth 2 (two) times a week. 24 capsule 3   HYDROcodone-acetaminophen (NORCO/VICODIN) 5-325 MG tablet 1-2 tabs po tid prn pain 180 tablet 0   cefdinir (OMNICEF) 300 MG capsule Take 1 capsule (300 mg total) by mouth 2 (two) times daily. (Patient not taking: Reported on 02/24/2021) 20 capsule 0   No facility-administered medications prior to visit.    Allergies  Allergen Reactions   Aspirin Itching   Calcium Alginate Other (See Comments)    Excessive burning   Morphine     Other reaction(s): Hallucinations   Iodinated Contrast Media Rash and Other (See Comments)    CT Contrast    ROS as  above, plus--> no fevers, no CP, no SOB, no wheezing, no  cough, no dizziness, no HAs, no rashes, no melena/hematochezia.  No polyuria or polydipsia.    No focal weakness, paresthesias, or tremors.  No acute vision or hearing abnormalities.  No dysuria or unusual/new urinary urgency or frequency. No n/v/d or abd pain.  No palpitations.     PE: Vitals with BMI 02/24/2021 02/17/2021 02/17/2021  Height '5\' 6"'  - -  Weight (No Data) - -  BMI - - -  Systolic [phone]  Diastolic 59 75 65  Pulse 88 62 66     Physical Exam Constitutional:      Appearance: Normal appearance.  Cardiovascular:     Rate and Rhythm: Normal rate and regular rhythm.     Heart sounds: Normal heart sounds.  Pulmonary:     Effort: Pulmonary effort is normal.     Breath sounds: Normal breath sounds.  Neurological:     Mental Status: She is alert.  Psychiatric:        Mood and Affect: Mood normal.        Behavior: Behavior normal.  Legs: R LL wrapped, not examined. L LL w/out pitting edema or skin breakdown.   LABS:  Last CBC Lab Results  Component Value Date   WBC 6.1 12/10/2020   HGB 12.3 12/10/2020   HCT 37.6 12/10/2020   MCV 95.2 12/10/2020   MCH 31.1 12/10/2020   RDW 12.2 12/10/2020   PLT 128 (L) 83/38/2505   Last metabolic panel Lab Results  Component Value Date   GLUCOSE 122 (H) 02/03/2021   NA 141 02/03/2021   K 3.9 02/03/2021   CL 100 02/03/2021   CO2 33 (H) 02/03/2021   BUN 32 (H) 02/03/2021   CREATININE 1.17 02/03/2021   GFRNONAA >60 12/10/2020   CALCIUM 9.0 02/03/2021   PHOS 4.7 (H) 05/21/2019   PROT 5.5 (L) 09/10/2020   ALBUMIN 3.2 (L) 05/30/2020   LABGLOB 3.2 07/23/2014   AGRATIO 0.8 07/23/2014   BILITOT 0.7 09/10/2020   ALKPHOS 30 (L) 05/30/2020   AST 19 09/10/2020   ALT 13 09/10/2020   ANIONGAP 8 12/10/2020   Lab Results  Component Value Date   HGBA1C 5.5 02/23/2020   IMPRESSION AND PLAN: Non-toxic appearing female who presents today for f/u on R leg edema and Hip pain.    Right lower leg lymphedema, recurrent superficial ulcerations and left lower leg, recurrent cellulitis in left lower leg. - improved/controlled since seeing vein clinic and getting UNNA boot.  No complaints of drainage or breakthrough pain. She will continue wearing leg wrap until her next appointment with the vein specialist on Tuesday (01/29/21).  Cont vicodin 1 tab q4h for chronic bilat LL pain.  RIGHT Hip pain - controlled. Recent ED visit imaging showed no evidence of fracture.  Hx of L hip fx, ORIF 05/2020.  Spoke with the patient about the need for DEXA and likely start osteoporosis treatment--will wait until her current issues are more ironed-out.  An After Visit Summary was printed and given to the patient.  FOLLOW UP: Return in about 2 months (around 04/24/2021) for routine chronic illness f/u.  Phil Dopp - MS3  Signed:  Crissie Sickles, MD           02/24/2021

## 2021-02-24 NOTE — Progress Notes (Signed)
See student note for this encounter. I have attested it. Signed:  Crissie Sickles, MD           02/24/2021

## 2021-02-24 NOTE — Telephone Encounter (Signed)
LVM for [facility] to initiate PT. Referral placed on 01/28/21 was sent to them and they were waiting for PCP to see pt prior to staring PT. Pt had appt today and provide still wants to continue with PT

## 2021-02-25 DIAGNOSIS — I509 Heart failure, unspecified: Secondary | ICD-10-CM | POA: Diagnosis not present

## 2021-02-25 DIAGNOSIS — R609 Edema, unspecified: Secondary | ICD-10-CM | POA: Diagnosis not present

## 2021-03-01 ENCOUNTER — Telehealth: Payer: Self-pay

## 2021-03-01 ENCOUNTER — Other Ambulatory Visit: Payer: Self-pay | Admitting: Family Medicine

## 2021-03-01 DIAGNOSIS — I890 Lymphedema, not elsewhere classified: Secondary | ICD-10-CM | POA: Diagnosis not present

## 2021-03-01 DIAGNOSIS — I83012 Varicose veins of right lower extremity with ulcer of calf: Secondary | ICD-10-CM | POA: Diagnosis not present

## 2021-03-01 DIAGNOSIS — L97219 Non-pressure chronic ulcer of right calf with unspecified severity: Secondary | ICD-10-CM | POA: Diagnosis not present

## 2021-03-01 DIAGNOSIS — R600 Localized edema: Secondary | ICD-10-CM | POA: Diagnosis not present

## 2021-03-01 NOTE — Telephone Encounter (Signed)
All forms faxed back and sent to scan

## 2021-03-01 NOTE — Telephone Encounter (Signed)
Pt's daughter, [name] stated the pharmacy does not have any Norco 10-325mg  in stock but has 7.5-325mg . Would need new rx and okay from provider in order to do this.   Please review and advise

## 2021-03-01 NOTE — Telephone Encounter (Signed)
Pt was seen by Dan Europe, PT on 02/20/21. Pt states they are waiting for report from vein specialist visit today at 11 to see if different POC needed for PT. Received HH certification and POC for PT and OT.   Placed on PCP desk to review and sign, if appropriate.

## 2021-03-01 NOTE — Telephone Encounter (Signed)
Signed and put in box to go up front. Signed:  Crissie Sickles, MD           03/01/2021

## 2021-03-02 ENCOUNTER — Other Ambulatory Visit: Payer: Self-pay | Admitting: Family Medicine

## 2021-03-02 ENCOUNTER — Ambulatory Visit: Payer: PPO | Admitting: Family Medicine

## 2021-03-02 MED ORDER — HYDROCODONE-ACETAMINOPHEN 7.5-325 MG PO TABS: ORAL_TABLET | ORAL | 0 refills | Status: DC

## 2021-03-02 NOTE — Telephone Encounter (Signed)
Please see if her home health provider can do this.

## 2021-03-02 NOTE — Telephone Encounter (Signed)
Pt's daughter advised rx sent in

## 2021-03-02 NOTE — Telephone Encounter (Signed)
Okay prescription sent.

## 2021-03-02 NOTE — Telephone Encounter (Signed)
Spoke with pt's daughter, [name] regarding pt getting compression wraps weekly for 3-4 weeks. Pt had appt at [facility] and it was recommended that she come every week for the next 3-4 weeks to have her legs wrapped. Daughter would like to know if this could be added to [facility] thru nursing or if pt should continue with vein clinic for this?  Please review and advise

## 2021-03-03 DIAGNOSIS — D631 Anemia in chronic kidney disease: Secondary | ICD-10-CM | POA: Diagnosis not present

## 2021-03-03 DIAGNOSIS — C009 Malignant neoplasm of lip, unspecified: Secondary | ICD-10-CM | POA: Diagnosis not present

## 2021-03-03 DIAGNOSIS — Z993 Dependence on wheelchair: Secondary | ICD-10-CM | POA: Diagnosis not present

## 2021-03-03 DIAGNOSIS — K449 Diaphragmatic hernia without obstruction or gangrene: Secondary | ICD-10-CM | POA: Diagnosis not present

## 2021-03-03 DIAGNOSIS — Z9981 Dependence on supplemental oxygen: Secondary | ICD-10-CM | POA: Diagnosis not present

## 2021-03-03 DIAGNOSIS — I5042 Chronic combined systolic (congestive) and diastolic (congestive) heart failure: Secondary | ICD-10-CM | POA: Diagnosis not present

## 2021-03-03 DIAGNOSIS — J449 Chronic obstructive pulmonary disease, unspecified: Secondary | ICD-10-CM | POA: Diagnosis not present

## 2021-03-03 DIAGNOSIS — N183 Chronic kidney disease, stage 3 unspecified: Secondary | ICD-10-CM | POA: Diagnosis not present

## 2021-03-03 DIAGNOSIS — E785 Hyperlipidemia, unspecified: Secondary | ICD-10-CM | POA: Diagnosis not present

## 2021-03-03 DIAGNOSIS — I4819 Other persistent atrial fibrillation: Secondary | ICD-10-CM | POA: Diagnosis not present

## 2021-03-03 DIAGNOSIS — M170 Bilateral primary osteoarthritis of knee: Secondary | ICD-10-CM | POA: Diagnosis not present

## 2021-03-03 DIAGNOSIS — E039 Hypothyroidism, unspecified: Secondary | ICD-10-CM | POA: Diagnosis not present

## 2021-03-03 DIAGNOSIS — L97818 Non-pressure chronic ulcer of other part of right lower leg with other specified severity: Secondary | ICD-10-CM | POA: Diagnosis not present

## 2021-03-03 DIAGNOSIS — F419 Anxiety disorder, unspecified: Secondary | ICD-10-CM | POA: Diagnosis not present

## 2021-03-03 DIAGNOSIS — I130 Hypertensive heart and chronic kidney disease with heart failure and stage 1 through stage 4 chronic kidney disease, or unspecified chronic kidney disease: Secondary | ICD-10-CM | POA: Diagnosis not present

## 2021-03-03 DIAGNOSIS — L03116 Cellulitis of left lower limb: Secondary | ICD-10-CM | POA: Diagnosis not present

## 2021-03-03 DIAGNOSIS — S7292XD Unspecified fracture of left femur, subsequent encounter for closed fracture with routine healing: Secondary | ICD-10-CM | POA: Diagnosis not present

## 2021-03-03 DIAGNOSIS — E669 Obesity, unspecified: Secondary | ICD-10-CM | POA: Diagnosis not present

## 2021-03-03 DIAGNOSIS — Z90710 Acquired absence of both cervix and uterus: Secondary | ICD-10-CM | POA: Diagnosis not present

## 2021-03-03 DIAGNOSIS — Z7901 Long term (current) use of anticoagulants: Secondary | ICD-10-CM | POA: Diagnosis not present

## 2021-03-03 DIAGNOSIS — K5790 Diverticulosis of intestine, part unspecified, without perforation or abscess without bleeding: Secondary | ICD-10-CM | POA: Diagnosis not present

## 2021-03-03 DIAGNOSIS — C8590 Non-Hodgkin lymphoma, unspecified, unspecified site: Secondary | ICD-10-CM | POA: Diagnosis not present

## 2021-03-03 DIAGNOSIS — I872 Venous insufficiency (chronic) (peripheral): Secondary | ICD-10-CM | POA: Diagnosis not present

## 2021-03-03 NOTE — Telephone Encounter (Signed)
Obtained fax number from [place] to send [place] and orders, this goes straight to intake team. Fax is [phone]. Spoke with Aldona Bar at [employer] in [place], she will send message to provider's nurse/cma and have them provide f/u call for order details regarding wraps(frequency of wrap, what kind of wrap, how long pt should continue with wrap).

## 2021-03-08 DIAGNOSIS — I87332 Chronic venous hypertension (idiopathic) with ulcer and inflammation of left lower extremity: Secondary | ICD-10-CM | POA: Diagnosis not present

## 2021-03-08 NOTE — Addendum Note (Signed)
Addended by: Deveron Furlong D on: 03/08/2021 02:02 PM   Modules accepted: Orders

## 2021-03-08 NOTE — Telephone Encounter (Signed)
Order completed and sent to intake fax # provided. Spoke with pt's daughter and pt is having re-wrap completed today with vein clinic. Hopefully [facility] nursing staff will call to set this up prior to next one.

## 2021-03-10 ENCOUNTER — Other Ambulatory Visit: Payer: Self-pay

## 2021-03-10 ENCOUNTER — Ambulatory Visit (HOSPITAL_COMMUNITY)
Admission: RE | Admit: 2021-03-10 | Discharge: 2021-03-10 | Disposition: A | Discharge status: Home / Self Care | Payer: PPO | Source: Ambulatory Visit | Attending: Nurse Practitioner | Admitting: Nurse Practitioner

## 2021-03-10 ENCOUNTER — Encounter (HOSPITAL_COMMUNITY): Payer: Self-pay | Admitting: Nurse Practitioner

## 2021-03-10 VITALS — BP 152/56 | HR 51 | Ht 66.0 in

## 2021-03-10 DIAGNOSIS — R001 Bradycardia, unspecified: Secondary | ICD-10-CM | POA: Insufficient documentation

## 2021-03-10 DIAGNOSIS — I130 Hypertensive heart and chronic kidney disease with heart failure and stage 1 through stage 4 chronic kidney disease, or unspecified chronic kidney disease: Secondary | ICD-10-CM | POA: Diagnosis not present

## 2021-03-10 DIAGNOSIS — N183 Chronic kidney disease, stage 3 unspecified: Secondary | ICD-10-CM | POA: Insufficient documentation

## 2021-03-10 DIAGNOSIS — Z79899 Other long term (current) drug therapy: Secondary | ICD-10-CM | POA: Diagnosis not present

## 2021-03-10 DIAGNOSIS — Z9221 Personal history of antineoplastic chemotherapy: Secondary | ICD-10-CM | POA: Diagnosis not present

## 2021-03-10 DIAGNOSIS — I4819 Other persistent atrial fibrillation: Secondary | ICD-10-CM | POA: Diagnosis not present

## 2021-03-10 DIAGNOSIS — I480 Paroxysmal atrial fibrillation: Secondary | ICD-10-CM

## 2021-03-10 DIAGNOSIS — D6869 Other thrombophilia: Secondary | ICD-10-CM

## 2021-03-10 DIAGNOSIS — I447 Left bundle-branch block, unspecified: Secondary | ICD-10-CM | POA: Insufficient documentation

## 2021-03-10 DIAGNOSIS — I5032 Chronic diastolic (congestive) heart failure: Secondary | ICD-10-CM | POA: Insufficient documentation

## 2021-03-10 DIAGNOSIS — Z8572 Personal history of non-Hodgkin lymphomas: Secondary | ICD-10-CM | POA: Diagnosis not present

## 2021-03-10 DIAGNOSIS — Z9181 History of falling: Secondary | ICD-10-CM | POA: Insufficient documentation

## 2021-03-10 DIAGNOSIS — I2510 Atherosclerotic heart disease of native coronary artery without angina pectoris: Secondary | ICD-10-CM | POA: Insufficient documentation

## 2021-03-10 DIAGNOSIS — Z7901 Long term (current) use of anticoagulants: Secondary | ICD-10-CM | POA: Insufficient documentation

## 2021-03-10 DIAGNOSIS — L97929 Non-pressure chronic ulcer of unspecified part of left lower leg with unspecified severity: Secondary | ICD-10-CM | POA: Insufficient documentation

## 2021-03-10 DIAGNOSIS — I890 Lymphedema, not elsewhere classified: Secondary | ICD-10-CM | POA: Diagnosis not present

## 2021-03-10 LAB — COMPREHENSIVE METABOLIC PANEL
ALT: 19 U/L (ref 0–44)
AST: 26 U/L (ref 15–41)
Albumin: 4.2 g/dL (ref 3.5–5.0)
Alkaline Phosphatase: 106 U/L (ref 38–126)
Anion gap: 8 (ref 5–15)
BUN: 12 mg/dL (ref 8–23)
CO2: 30 mmol/L (ref 22–32)
Calcium: 9.1 mg/dL (ref 8.9–10.3)
Chloride: 104 mmol/L (ref 98–111)
Creatinine, Ser: 0.97 mg/dL (ref 0.44–1.00)
GFR, Estimated: 57 mL/min — ABNORMAL LOW (ref >60)
Glucose, Bld: 122 mg/dL — ABNORMAL HIGH (ref 70–99)
Potassium: 4.2 mmol/L (ref 3.5–5.1)
Sodium: 142 mmol/L (ref 135–145)
Total Bilirubin: 0.9 mg/dL (ref 0.3–1.2)
Total Protein: 6.5 g/dL (ref 6.5–8.1)

## 2021-03-10 LAB — TSH: TSH: 17.185 u[IU]/mL — ABNORMAL HIGH (ref 0.350–4.500)

## 2021-03-10 MED ORDER — AMIODARONE HCL 200 MG PO TABS: 100.0000 mg | ORAL_TABLET | Freq: Every day | ORAL | Status: DC

## 2021-03-10 MED ORDER — AMIODARONE HCL 200 MG PO TABS: 200.0000 mg | ORAL_TABLET | Freq: Every day | ORAL | Status: DC

## 2021-03-10 MED ORDER — FUROSEMIDE 40 MG PO TABS: ORAL_TABLET | ORAL | Status: DC

## 2021-03-10 NOTE — Progress Notes (Signed)
Patient ID: Amanda Sellers, female   DOB: [birth year], 88 y.o.   MRN: [id number]           Primary Care Physician: Tammi Sou, MD Referring Physician: Dr. Ronni Rumble Amanda Sellers is a 88 y.o. female with a h/o  diastolic heart failure, afib, newly diagnosed with recent dx of lymphoma. She was admitted to [facility] 9/22 on IV dilt and given diuretics for acute on chronic systolic HF.  It was arranged for to undergo TEE DCCV.   She had exertional dyspnea with PND & orthopnea. She felt very tired & had little energy.  She  noted a few palpitations, but does not recall rapid irregular heart rates/rhythms.    She had TEE without thrombus and underwent DCCV that was successful with one shock of 120J. She did well post procedure and was seen by Dr. Jerilynn Mages. Croitoru and found stable for discharge.   She was negative 2700cc since admit and weight was down 4 lbs with IV diuretics.  D/C wt is 168.  Plan on d/c was for  uninterrupted anticoagulation for next 30 days, preferably lifelong barring bleeding complications.  AFib clinic follow up in 1-2 weeks. Outpatient [facility], non-urgent.  ASA stopped.   She was seen in the afib clinic, 10/10 and found to be in afib with rvr. She went into afib short time after cardioversion. EKG showed afib at 130-140 bpm. She was  not on any drugs for rate control.  Is taking DOAC without fail. Continues with chemotherapy for lymphoma.   She was started on BB and  returned to clinic 10/12 with better v rates. 80's with sitting and 90-100's with activity. She does not have any PND/orthopnea although she has slept better in her recliner for months now.No lower extremity edema. Her weight is stable at home.  She has  finished chemotherapy for non- hodgkin's lymphoma and had f/u Pet scan 10/26,  and CA appears to be in remission, so per pt, no more chemo for right now. She has been on DOAC now x one month so will start attempts to restore SR. Discussed options  amiodarone or tikosyn and she would prefer amiodarone as she has two neighbors on amiodarone and they are both in rhythm and tolerating drug well. She is not interested in a hospital stay for drug loading. She reports rash with recent contrast dye. Amiodarone was started 200 mg bid, to then go down to 200 mg a day and then would discuss cardioversion.  She asked to be seen 11/2,  in the afib clinic for increased shortness of breath yesterday and last night. She has slept in the recliner for months but did not feel as well last night and slept poorly, feeling more short of breath and fullness in her chest.She has noticed increased pedal edema. She did not take her amiodarone last night or this am. Ekg shows rate controlled afib at 82 bpm, qtc at 464 ms.PO on RA is 98%. Weight is up 2 lbs from last visit, 6 lbs since 10/17. Lasix dose was decreased to 40 mg daily from 40 mg am and 20 mg pm when seen by Dr. Ellyn Hack 10/22.. Pt has noticed weight climbing for several days. She is fairly comfortable when quiet and sitting.   She was asked to double lasix to 80 mg  x 3 days, and to reduce amiodarone 200 mg a day. She had lost 3 pounds on return to clinic several days later. Clinically, she  is much improved with more energy and less shortness of breath. Able to sleep well. Had enough energy to walk into clinic today without wheelchair and felt well enough to clean her house yesterday. Ideally,I would like to load amiodarone a while longer but will go ahead and  pursue DCCV sooner than later, as it is contributing to her heart failure. Compliant with blood thinner without missed doses On recent labs, TSH was elevated but daughter explained that she just started taking replacement correctly on an empty stomach, and sees PCP 11/16 and she expects him to adjust the dose.  11/14-  Returns to afib clinic with a smile on her face and reporting all the activities she has been doing over the last week, having returned to  sinus rhythm after cardioversion. Much more energy and breathing is much improved.  Fluid status is stable.  12/13- Returns to afib clinic for f/u and reports doing well No afib, no further shortness of breath or edema. She is back to her usual activities. Got a good report form the Ca unit recently. Tolerating amiodarone 200 mg a day.  Return to afib clinic 4/18, reports that she is staying in [place]. Saw pulmonology yesterday for some c/o of shortness of breath but breathing much improved since staying in SR. Xray no active process, thyroid, liver studies drawn yesterday, and normal. MD said he would call hr after results of tests were reviewed. Fluid status normal.  F/u in afib clinic 01/31/16, pt continues to do well without any afib. Her fluid status is normal. Last TSH, 3 months ago, showed mild increase and will check TSH and liver panel today. Her breathing is good other than the last week in the bitter cold weather  she does not breath as good outside.No bleeding issues with eliquis. Fluid status is stable on lasix.  F/u in afib 01/26/17  for shortness of breath. Pt has been having shortness of breath for some while but contributed it to her knees and difficultly in ambulating. She was seen by pulmonology in July with improved PFT's and was OK to continue amiodarone. She was seen in November by Dr. Ellyn Hack and shortness of breath was thought to be stable/chronic. She feels that her shortness of breath has worsened since Christmas.  If she walks to the bathroom and back she is entirely out of breath. Sleeps on 2 pillows and has not felt the need to increase to elevate her head more at night. Ekg today shows junctional rhythm at 46 bpm. She is breathless speaking and started walking to clinic room but had to stop and get w/c. PO 99%.  F/u in afib clinic 1/7, after reducing amiodarone to 1/2 tab a day as well BB to 1/2 tab a day for brady as well as increase in shortness of breath. She is feeling much better  today to the point she was able to walk to clinic and not use w/c. She is not breathless talking.  F/u in afib clinic, 1/23, She feels great! Saw her pulmonologist recently and she said he was very pleased with her lung status. She is walking with a walker and observed coming into the clinic, no exertional dyspnea noted.  Her  HR is Sinus in the 40's today, but she is not symptomatic. She checks her HR with a pulse ox several times a day and it usually runs around 53-60.   F/u in afib clinic, 08/15/17. She is feeling well. Just saw Dr. Lenna Gilford and was given a  good report. Ekg, SR with HR's in the 50's. 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 She has some chromic LE, which was worse after recent knee injections.. She did increase lasix for a couple of days and now back to baseline now.  F/u in afib clinic, 1/8, she is doing very well. She is in SR and has not noted any afib. 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 No LLE. She walks with a walker, no falls. No shortness of breath. On amiodarone 100 mg qd. Notices a little numbness in her distal fingertips intermittently.No issues with anticoagulation.  F/u in 07/31/18. Continues on amiodarone for afib management. She is in [place] today and has not noted any irregular heart rhythm. Her main complaint is that she has noted more LLE and has been back to sleeping in her recliner. She continues on lasix 40 mg bid. Avoids salt and salty foods and sits with her feet up during the day. No change in her health other than increase in  edema. She does drink 8 + glasses of water a day and may need to back off that some.weight is up 7 lbs when compared to last in person office visit in March.  F/u 03/11/19. She states that she feels great. She is in SR, no appreciated afib. Continues on amiodarone.  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 No pedal edema or shortness of breath. No issues with eliquis with a CHA2DS2VASc score of 5.  F/u in afib clinic,  09/16/19. She remains on amiodarone 100 mg daily and is staying in SR. She was hospitalized for fluid retention/shortness of breath in April 2021. Had a LHC, had some CAD but thought to be best treated medically. She was placed on entresto and amlodipine. She has done well since then. Denies any dyspnea  today. She continues to sit with the elderly, but now has a break, as the woman she was staying with fell and broke her hip and is still in the hospital. She is currently in the donut hole with entresto and eliquis, being her most expensive drugs.   F/u in afib clinic, 04/30/20. She remains in SR with  On 200 mg amiodarone daily.  It was increased  with she had afib with RVR/HF hospitalization last spring. Labs checked in January with normal liver enzymes and normal TSH. She has yearly PFT's with her pulmonologist. She has some chronic shortness of breath, at baseline. She is still sitting with a lady in her 54's for 4 days a week.   F/u in afib clinic, 03/10/21. Unfortunately, since I saw pt last, she had a fall, no fractures but was sent to a rehab facility. Pt states  "They almost killed me". Her lasix was held for the most part that she was there and she  developed weeping  legs, rt worse than left and was seen on d/c by PCP with dx of rt lower leg lymphedema, recurrent superficial ulcerations, in the setting of chronic renal insufficieny. Pt states that her leg is much improved but still has some ulcerations on top of her rt foot. She is being followed by [facility]  and has f/u with them again on Monday. EKG shows sinus brady at 51 bpm, with bigeminy PAC's with LBBB. Will reduce amiodarone to 100 mg daily.  Today, she denies symptoms of palpitations, chest pain,  orthopnea, PND, mild baseline chronic lower extremity edema, no dizziness, presyncope,  syncope, or neurologic sequela.  The patient is tolerating medications without difficulties and is otherwise without complaint today.   Past Medical History:   Diagnosis Date   Anemia of chronic disease    Anticoagulation adequate, Eliquis 10/16/2014   Anxiety    clonaz helps this AND her breathing   Cholelithiasis without cholecystitis 07/2020   noted on CT abd/pelv at [facility]   Chronic combined systolic and diastolic congestive heart failure ([place]) 23/55/7322   Complicated by atrial fibrillation; Echo 07/23/14: mild LVH, EF 50-55%, Gr 2 DD.;  Echo May 15, 2019: EF 35 to 40%, GRII DD.  (In setting of CHF exacerbation, class III)-> baseline class II symptoms. 09/2019 EF 55-60%, nl wall motion and LV fxn, grd II DD, mod MR.   Chronic renal insufficiency, stage III (moderate) (HCC)    GFR @ 50   COPD (chronic obstructive pulmonary disease) ([place])    recently noted on Xray, does not have any problems   Diverticulosis    Follicular lymphoma ([place])    Non Hodgkins B cell lymphoma; s/p chemo summer 2016--in remission still as of 09/2018: continue obs q 74mo  Hiatal hernia 07/2020   moderate size-[facility])     06/2014   HTN (hypertension)    Hypothyroidism    IFG (impaired fasting glucose)    Lip cancer    Dr. BJanace Hoardexcised this: invasive SCC--no sign of cancer at ENT f/u 10/2015   Microcytic anemia    transfused 3 U total in hosp 07/2014   Obesity (BMI 30-39.9)    Pancytopenia due to antineoplastic chemotherapy ([place]    Persistent atrial fibrillation ([place]    With RVR; elec cardioversion 11/30/14.  On Amio since 10/2014; Dr. NLenna Gilfordfollowing her from pulm standpoint regarding this med.   Positive occult stool blood test 08/08/2014   Endoscopies ok 08/2014   Primary osteoarthritis of both knees 04/2016   Dr. GGladstone Lighter bone on bone--to get euflexxa to both knees.   Protein calorie malnutrition ([place] 2022   Pulmonary hypertension due to left heart disease ([place]    RLCP 05/19/2019: EF 35-40%. D1 50%, RI 70%, dLM-LAD 30%.  mPAP 43 mmHg - (62/19 mmHg), PCWP  28 mmHg (V wave 41 mmHg) LVEDP  34 mmHg   Pulmonary metastases (HCC) 07/24/2014   S/p left hip fracture 05/2020   ORIF   Vitamin B 12 deficiency    Vitamin D deficiency    dose increased to 50 K U tab TWICE weekly 04/2017   Past Surgical History:  Procedure Laterality Date   ABDOMINAL HYSTERECTOMY  1978   BONE MARROW BIOPSY  07/24/2014   BREAST BIOPSY     CARDIOVERSION N/A 10/16/2014   Procedure: CARDIOVERSION;  Surgeon: BLelon Perla MD;  [facility];  Service: Cardiovascular;  Laterality: N/A;   CARDIOVERSION N/A 11/30/2014   Procedure: CARDIOVERSION;  Surgeon: DLarey Dresser MD;  [place]  Service: Cardiovascular;  Laterality: N/A;   CATARACT EXTRACTION  2020   OU: Dr. GKaty Fitch  COLONOSCOPY  09/23/2014   small hemorrhoids, otherwise normal (performed for IDA and heme+ stool)   [place]    [place]   INTRAMEDULLARY (IM) NAIL INTERTROCHANTERIC Left 05/25/2020   Procedure: INTRAMEDULLARY (IM) NAIL INTERTROCHANTRIC;  Surgeon: AErle Crocker MD;  [place] [facility]  Service: Orthopedics;  Laterality: Left;   MASS  EXCISION Left 07/15/2015   Left lower lip mass--invasive SCC w/negative margins.  Procedure: EXCISION MASS;  Surgeon: Melissa Montane, MD;  Location: [place];  Service: ENT;  Laterality: Left;  Wedge excision left lower lip mass   NM MYOVIEW LTD  10/29/2014   medium size mild surgery defect in the mid anterior and apical anterior location suggestive of breast attenuation. No reversibility. LOW RISK   PFTs  02/2015   Restriction with diffusion defect: cardiologist referred her to pulm to help interpret PFTs and decide whether she has amiodarone toxicity   RIGHT/LEFT HEART CATH AND CORONARY ANGIOGRAPHY N/A 05/19/2019   Procedure: RIGHT/LEFT HEART CATH AND CORONARY ANGIOGRAPHY;  Surgeon: Troy Sine, MD;  Location: [place] CV LAB;; EF 35-40%. D1 50%, RI 70%, dLM-LAD 30%.  mPAP 43 mmHg - (62/19 mmHg), PCWP  28 mmHg (V wave 41  mmHg) LVEDP 34 mmHg   TEE WITHOUT CARDIOVERSION N/A 10/16/2014   Procedure: TRANSESOPHAGEAL ECHOCARDIOGRAM (TEE);  Surgeon: Lelon Perla, MD;  [facility];  Service: Cardiovascular;  Laterality: N/A;   TRANSTHORACIC ECHOCARDIOGRAM  10/09/2019   EF 55 to 60%.  GRII DD.  No R WMA.  Normal PAP and RAP.  Severe LA dilation.  Mild RA dilation.  Moderate MR (likely related to annular dilation in the setting of severe LAE.  Mild aortic valve sclerosis-no stenosis.   TRANSTHORACIC ECHOCARDIOGRAM  05/15/2019   Acute CHF exacerbation:  EF 35-40%.  Moderately reduced function.  (Previous EF was 55 to 60%) global HK.  GRII DD.  Mild LA dilation.  Normal RV size.   UPPER GI ENDOSCOPY  09/23/2014   small hiatus hernia, nodules in stomach biopsied (chronic active erosive atrophic gastritis with intestinal metaplasia--no dysplasia or malignancy) otherwise normal    Current Outpatient Medications  Medication Sig Dispense Refill   cyanocobalamin (,VITAMIN B-12,) 1000 MCG/ML injection INJECT 1 ML INTO THE SKIN EVERY 30 DAYS. 3 mL 3   diclofenac Sodium (VOLTAREN) 1 % GEL APPLY 2 G TOPICALLY 4 (FOUR) TIMES DAILY. APPLY TO LE JOINTS THAT ARE IN PAIN. 300 g 1   ELIQUIS 5 MG TABS tablet TAKE 1 TABLET BY MOUTH TWICE A DAY 60 tablet 8   ferrous sulfate 325 (65 FE) MG tablet Take 1 tablet (325 mg total) by mouth 2 (two) times daily with a meal. 30 tablet 1   fish oil-omega-3 fatty acids 1000 MG capsule Take 2 g by mouth daily.     fluticasone (CUTIVATE) 0.05 % cream Apply to affected area of lower legs bid prn 60 g 2   HYDROcodone-acetaminophen (NORCO) 7.5-325 MG tablet 1-2 tid prn pain 180 tablet 0   hydrOXYzine (VISTARIL) 25 MG capsule TAKE 1-2 CAP BY MOUTH AT BED TIME FOR SLEEP 60 capsule 1   levothyroxine (SYNTHROID) 100 MCG tablet TAKE 1 TABLET BY MOUTH EVERY DAY BEFORE BREAKFAST (Patient taking differently: Take 100 mcg by mouth daily before breakfast.) 90 tablet 1   OXYGEN Inhale into the lungs. 1Lt  at bedtime  as needed     Potassium Chloride ER 20 MEQ TBCR TAKE 1 TABLET BY MOUTH EVERY DAY 90 tablet 1   rosuvastatin (CRESTOR) 20 MG tablet TAKE 1 TABLET BY MOUTH EVERY DAY 90 tablet 3   sacubitril-valsartan (ENTRESTO) 24-26 MG Take 1 tablet by mouth 2 (two) times daily. 60 tablet 11   Vitamin D, Ergocalciferol, (DRISDOL) 1.25 MG (50000 UNIT) CAPS capsule Take 1 capsule (50,000 Units total) by mouth 2 (two) times a week. 24 capsule  3   amiodarone (PACERONE) 200 MG tablet Take 0.5 tablets (100 mg total) by mouth daily.     furosemide (LASIX) 40 MG tablet Taking one tablet by mouth twice daily     No current facility-administered medications for this encounter.    Allergies  Allergen Reactions   Aspirin Itching   Calcium Alginate Other (See Comments)    Excessive burning   Morphine     Other reaction(s): Hallucinations   Iodinated Contrast Media Rash and Other (See Comments)    CT Contrast    Social History   Socioeconomic History   Marital status: Widowed    Spouse name: Not on file   Number of children: 8   Years of education: Not on file   Highest education level: Not on file  Occupational History   Occupation: retired  Tobacco Use   Smoking status: Never   Smokeless tobacco: Never  Vaping Use   Vaping Use: Never used  Substance and Sexual Activity   Alcohol use: No    Alcohol/week: 0.0 standard drinks   Drug use: No   Sexual activity: Not on file  Other Topics Concern   Not on file  Social History Narrative   Widowed, has 8 children.   Orig from [place], now lives in [place].   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   No tob/alc/drugs.   Tries to walk, but her back starts to hurt.  Has to use a walker.   Social Determinants of Health   Financial Resource Strain: Low Risk    Difficulty of Paying Living Expenses: Not hard at all  Food Insecurity: No Food Insecurity   Worried About Charity fundraiser in the Last  Year: Never true   [place] in the Last Year: Never true  Transportation Needs: No Transportation Needs   Lack of Transportation (Medical): No   Lack of Transportation (Non-Medical): No  Physical Activity: Insufficiently Active   Days of Exercise per Week: 5 days   Minutes of Exercise per Session: 10 min  Stress: No Stress Concern Present   Feeling of Stress : Not at all  Social Connections: Moderately Isolated   Frequency of Communication with Friends and Family: More than three times a week   Frequency of Social Gatherings with Friends and Family: Once a week   Attends Religious Services: More than 4 times per year   Active Member of [employer] or Organizations: No   Attends Archivist Meetings: Never   Marital Status: Widowed  Human resources officer Violence: Not At Risk   Fear of Current or Ex-Partner: No   Emotionally Abused: No   Physically Abused: No   Sexually Abused: No    Family History  Problem Relation Age of Onset   Melanoma Mother    Stomach cancer Father    Lymphoma Sister    Diabetes Daughter     ROS- All systems are reviewed and negative except as per the HPI above  Physical Exam: Vitals:   03/10/21 0959  BP: (!) 152/56  Pulse: (!) 51  Height: $Remove'5\' 6"'GiOyGNS$  (1.676 m)    GEN- The patient is well appearing, alert and oriented x 3 today.   Head- normocephalic, atraumatic Eyes-  Sclera clear, conjunctiva pink Ears- hearing intact Oropharynx- clear Neck- supple, no JVP Lymph- no cervical lymphadenopathy Lungs- Clear to ausculation bilaterally, normal work of breathing Heart-  regular rate and rhythm, no murmurs, rubs or  gallops, PMI not laterally displaced GI- soft, NT, ND, + BS Extremities- no clubbing, cyanosis, 2+  Edema to knee area MS- no significant deformity or atrophy Skin- no rash or lesion Psych- euthymic mood, full affect Neuro- strength and sensation are intact  EKG  Probable sinus bradycardia with bigeminal PAC's and LBBB  Assessment  and Plan: 1 Afib Maintaining SR on 200 mg amiodarone Will cut down to 100 mg daily for bradycardia  Off BB for some time now  for bradycardia Continue apixaban for chadsvasc score of at least 4  Continue yearly PFT's  thru  pulmonology  Tsh/cmet today   2. Diastolic heart failure Weight  stable  Continue  lasix 40 mg bid  Continue  entresto   Avoid salt   3. HTN Stable   4. Non Hodgkns B cell lymphoma S/p chemotherapy Per oncology  5. LLE lymphedema with superficial ulcerations  Per [facility]  F/u in  one month for EKG for reduction in amiodarone   Butch Penny C. Linnie Mcglocklin, [facility] [address] [place], [place] [address] [phone]

## 2021-03-12 ENCOUNTER — Other Ambulatory Visit: Payer: Self-pay | Admitting: Family Medicine

## 2021-03-14 ENCOUNTER — Other Ambulatory Visit: Payer: Self-pay

## 2021-03-14 MED ORDER — LEVOTHYROXINE SODIUM 112 MCG PO TABS: 112.0000 ug | ORAL_TABLET | Freq: Every day | ORAL | 1 refills | Status: DC

## 2021-03-15 NOTE — Telephone Encounter (Signed)
Opened in error

## 2021-03-16 ENCOUNTER — Telehealth: Payer: Self-pay | Admitting: Family Medicine

## 2021-03-16 NOTE — Telephone Encounter (Signed)
Ok for orders? 

## 2021-03-16 NOTE — Telephone Encounter (Signed)
Amanda Sellers (home health) [phone] wanting verbal orders for nurse evaluation.  Pt have wounds that opened up on top of right foot.

## 2021-03-16 NOTE — Telephone Encounter (Signed)
Yes okay 

## 2021-03-17 ENCOUNTER — Other Ambulatory Visit: Payer: Self-pay | Admitting: Family Medicine

## 2021-03-17 NOTE — Telephone Encounter (Signed)
LM for approved orders and to return call if needing to discuss.

## 2021-03-21 DIAGNOSIS — I83225 Varicose veins of left lower extremity with both ulcer other part of foot and inflammation: Secondary | ICD-10-CM | POA: Diagnosis not present

## 2021-03-22 ENCOUNTER — Other Ambulatory Visit: Payer: Self-pay | Admitting: Cardiology

## 2021-03-23 ENCOUNTER — Inpatient Hospital Stay: Payer: PPO

## 2021-03-23 ENCOUNTER — Other Ambulatory Visit: Payer: Self-pay

## 2021-03-23 ENCOUNTER — Encounter: Payer: Self-pay | Admitting: Oncology

## 2021-03-23 ENCOUNTER — Inpatient Hospital Stay: Payer: PPO | Attending: Oncology | Admitting: Oncology

## 2021-03-23 VITALS — BP 150/66 | HR 60 | Temp 98.1°F | Resp 20 | Ht 66.0 in

## 2021-03-23 DIAGNOSIS — C8200 Follicular lymphoma grade I, unspecified site: Secondary | ICD-10-CM | POA: Diagnosis not present

## 2021-03-23 DIAGNOSIS — Z85828 Personal history of other malignant neoplasm of skin: Secondary | ICD-10-CM | POA: Insufficient documentation

## 2021-03-23 DIAGNOSIS — I4892 Unspecified atrial flutter: Secondary | ICD-10-CM | POA: Diagnosis not present

## 2021-03-23 DIAGNOSIS — Z8572 Personal history of non-Hodgkin lymphomas: Secondary | ICD-10-CM | POA: Insufficient documentation

## 2021-03-23 DIAGNOSIS — I509 Heart failure, unspecified: Secondary | ICD-10-CM | POA: Diagnosis not present

## 2021-03-23 DIAGNOSIS — D696 Thrombocytopenia, unspecified: Secondary | ICD-10-CM | POA: Insufficient documentation

## 2021-03-23 LAB — CBC WITH DIFFERENTIAL (CANCER CENTER ONLY)
Abs Immature Granulocytes: 0.01 10*3/uL (ref 0.00–0.07)
Basophils Absolute: 0 10*3/uL (ref 0.0–0.1)
Basophils Relative: 0 %
Eosinophils Absolute: 0.1 10*3/uL (ref 0.0–0.5)
Eosinophils Relative: 2 %
HCT: 38.3 % (ref 36.0–46.0)
Hemoglobin: 12.5 g/dL (ref 12.0–15.0)
Immature Granulocytes: 0 %
Lymphocytes Relative: 24 %
Lymphs Abs: 1 10*3/uL (ref 0.7–4.0)
MCH: 31.3 pg (ref 26.0–34.0)
MCHC: 32.6 g/dL (ref 30.0–36.0)
MCV: 95.8 fL (ref 80.0–100.0)
Monocytes Absolute: 0.3 10*3/uL (ref 0.1–1.0)
Monocytes Relative: 7 %
Neutro Abs: 2.8 10*3/uL (ref 1.7–7.7)
Neutrophils Relative %: 67 %
Platelet Count: 131 10*3/uL — ABNORMAL LOW (ref 150–400)
RBC: 4 MIL/uL (ref 3.87–5.11)
RDW: 13.9 % (ref 11.5–15.5)
WBC Count: 4.2 10*3/uL (ref 4.0–10.5)
nRBC: 0 % (ref 0.0–0.2)

## 2021-03-23 NOTE — Progress Notes (Signed)
?[place] ?OFFICE PROGRESS NOTE ? ? ?Diagnosis: Non-Hodgkin's lymphoma ? ?INTERVAL HISTORY:  ? ?Amanda Sellers returns as scheduled.  She is here with her daughter.  She continues to recover from a hip fracture last year.  She ambulates with assistance.  She had a recent fall and injured the right leg.  She has skin breakdown at the right leg secondary to swelling and "venous "problems. ?No fever, night sweats, or palpable lymph nodes.  Good appetite. ? ?Objective: ? ?Vital signs in last 24 hours: ? ?Blood pressure (!) 150/66, pulse 60, temperature 98.1 ?F (36.7 ?C), temperature source Oral, resp. rate 20, height $RemoveBe'5\' 6"'[id number]$  (1.676 m), SpO2 100 %. ?  ? ?HEENT: Neck without mass ?Lymphatics: No cervical, supraclavicular, axillary, or inguinal nodes ?Resp: Lungs with end inspiratory rhonchi at the left lower posterior chest, no respiratory distress ?Cardio: Regular rate and rhythm ?GI: Nontender, no hepatosplenomegaly ?Vascular: No left leg edema, Ace wrap over the right lower leg ? ? ?Lab Results: ? ?Lab Results  ?Component Value Date  ? WBC 4.2 03/23/2021  ? HGB 12.5 03/23/2021  ? HCT 38.3 03/23/2021  ? MCV 95.8 03/23/2021  ? PLT 131 (L) 03/23/2021  ? NEUTROABS 2.8 03/23/2021  ? ? ?CMP  ?Lab Results  ?Component Value Date  ? NA 142 03/10/2021  ? K 4.2 03/10/2021  ? CL 104 03/10/2021  ? CO2 30 03/10/2021  ? GLUCOSE 122 (H) 03/10/2021  ? BUN 12 03/10/2021  ? CREATININE 0.97 03/10/2021  ? CALCIUM 9.1 03/10/2021  ? PROT 6.5 03/10/2021  ? ALBUMIN 4.2 03/10/2021  ? AST 26 03/10/2021  ? ALT 19 03/10/2021  ? ALKPHOS 106 03/10/2021  ? BILITOT 0.9 03/10/2021  ? GFRNONAA 57 (L) 03/10/2021  ? GFRAA 50 (L) 05/29/2019  ? ? ?Lab Results  ?Component Value Date  ? CEA 0.8 07/23/2014  ? ? ?Medications: I have reviewed the patient's current medications. ? ? ?Assessment/Plan: ?1.Non-Hodgkin's Lymphoma-bone marrow biopsy 07/24/2014 consistent with involvement by follicular B-cell lymphoma, CD20 positive   ?CTs of the chest  07/22/2014 and CT of the abdomen and pelvis on 07/23/2014-pulmonary nodules, hilar/mediastinal/supraclavicular/axillary adenopathy, splenomegaly, abdominal adenopathy, bilateral adrenal nodules   ?Cycle 1 bendamustine/rituximab 08/04/2014 ?Cycle 2 bendamustine/Rituxan 09/01/2014 ?Cycle 3 bendamustine/Rituxan 09/30/2014 ?Cycle 4 bendamustine/rituximab 10/27/2014 ?Restaging CT scans 11/17/2014 with significant improvement in diffuse lymphadenopathy, lung nodules, and renal lesions ?2. Severe microcytic anemia, status post transfusion with packed red blood cells 07/23/2014   ?Normal ferritin, low transferrin saturation, "scant" bone marrow iron stores, review of peripheral blood smear consistent with iron deficiency anemia   ?colonoscopy and upper endoscopy 09/23/2014 ?3. Exertional dyspnea /orthopnea-most likely secondary to congestive heart failure   ?4. History of B-12 deficiency   ?5. Rash over the trunk and extremities 08/18/2014. Appears consistent with a drug rash. Resolved 08/28/2014. ?Rash 11/18/2014, likely secondary to a contrast allergy  , diagnostic contrast will be listed as an allergy ?6. Colonoscopy 06/03/2008. Medium sized internal hemorrhoids.   ?7. Admission with atrial flutter with a rapid ventricular response 10/15/2014-status post cardioversion 10/16/2014, maintained on ?    apixaban ?Repeat cardioversion 11/30/2014 ?  ?8. Right olecranon skin lesion-likely a lipoma or cyst ?9. 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  ? ? ?Disposition: ?Ms. Rohrig remains in clinical remission from non-Hodgkin's involvement.  She would like continue follow-up at the cancer center.  She will return for an office visit in 8 months.  She has stable chronic mild thrombocytopenia. ? ? ?Amanda Coder,  MD ? ?03/23/2021  ?12:05 PM ? ? ? ?

## 2021-03-24 DIAGNOSIS — N183 Chronic kidney disease, stage 3 unspecified: Secondary | ICD-10-CM | POA: Diagnosis not present

## 2021-03-24 DIAGNOSIS — E669 Obesity, unspecified: Secondary | ICD-10-CM | POA: Diagnosis not present

## 2021-03-24 DIAGNOSIS — M170 Bilateral primary osteoarthritis of knee: Secondary | ICD-10-CM | POA: Diagnosis not present

## 2021-03-24 DIAGNOSIS — E785 Hyperlipidemia, unspecified: Secondary | ICD-10-CM | POA: Diagnosis not present

## 2021-03-24 DIAGNOSIS — Z7901 Long term (current) use of anticoagulants: Secondary | ICD-10-CM | POA: Diagnosis not present

## 2021-03-24 DIAGNOSIS — L97818 Non-pressure chronic ulcer of other part of right lower leg with other specified severity: Secondary | ICD-10-CM | POA: Diagnosis not present

## 2021-03-24 DIAGNOSIS — I130 Hypertensive heart and chronic kidney disease with heart failure and stage 1 through stage 4 chronic kidney disease, or unspecified chronic kidney disease: Secondary | ICD-10-CM | POA: Diagnosis not present

## 2021-03-24 DIAGNOSIS — D631 Anemia in chronic kidney disease: Secondary | ICD-10-CM | POA: Diagnosis not present

## 2021-03-24 DIAGNOSIS — I872 Venous insufficiency (chronic) (peripheral): Secondary | ICD-10-CM | POA: Diagnosis not present

## 2021-03-24 DIAGNOSIS — L03116 Cellulitis of left lower limb: Secondary | ICD-10-CM | POA: Diagnosis not present

## 2021-03-24 DIAGNOSIS — K449 Diaphragmatic hernia without obstruction or gangrene: Secondary | ICD-10-CM | POA: Diagnosis not present

## 2021-03-24 DIAGNOSIS — I4819 Other persistent atrial fibrillation: Secondary | ICD-10-CM | POA: Diagnosis not present

## 2021-03-24 DIAGNOSIS — I5042 Chronic combined systolic (congestive) and diastolic (congestive) heart failure: Secondary | ICD-10-CM | POA: Diagnosis not present

## 2021-03-24 DIAGNOSIS — C8590 Non-Hodgkin lymphoma, unspecified, unspecified site: Secondary | ICD-10-CM | POA: Diagnosis not present

## 2021-03-24 DIAGNOSIS — J449 Chronic obstructive pulmonary disease, unspecified: Secondary | ICD-10-CM | POA: Diagnosis not present

## 2021-03-24 DIAGNOSIS — Z993 Dependence on wheelchair: Secondary | ICD-10-CM | POA: Diagnosis not present

## 2021-03-24 DIAGNOSIS — C009 Malignant neoplasm of lip, unspecified: Secondary | ICD-10-CM | POA: Diagnosis not present

## 2021-03-24 DIAGNOSIS — Z90710 Acquired absence of both cervix and uterus: Secondary | ICD-10-CM | POA: Diagnosis not present

## 2021-03-24 DIAGNOSIS — S7292XD Unspecified fracture of left femur, subsequent encounter for closed fracture with routine healing: Secondary | ICD-10-CM | POA: Diagnosis not present

## 2021-03-24 DIAGNOSIS — K5790 Diverticulosis of intestine, part unspecified, without perforation or abscess without bleeding: Secondary | ICD-10-CM | POA: Diagnosis not present

## 2021-03-24 DIAGNOSIS — Z9981 Dependence on supplemental oxygen: Secondary | ICD-10-CM | POA: Diagnosis not present

## 2021-03-24 DIAGNOSIS — F419 Anxiety disorder, unspecified: Secondary | ICD-10-CM | POA: Diagnosis not present

## 2021-03-24 DIAGNOSIS — E039 Hypothyroidism, unspecified: Secondary | ICD-10-CM | POA: Diagnosis not present

## 2021-03-25 ENCOUNTER — Telehealth: Payer: Self-pay | Admitting: Family Medicine

## 2021-03-25 DIAGNOSIS — I509 Heart failure, unspecified: Secondary | ICD-10-CM | POA: Diagnosis not present

## 2021-03-25 DIAGNOSIS — R609 Edema, unspecified: Secondary | ICD-10-CM | POA: Diagnosis not present

## 2021-03-25 NOTE — Telephone Encounter (Signed)
FYI  Please see below

## 2021-03-25 NOTE — Telephone Encounter (Signed)
FYI: ?Lannie Fields pt daughter  ?Said [facility] is came out yesterday to help her mother. Pt is taking therapy. ? ?[facility] ([facility]) ?She said pt daughter had to call us back. They've been trying to come out since Feb 16. ?They giving pt therapy before.  ? ?She wanted [place] to know ? ? ?

## 2021-03-29 DIAGNOSIS — I83215 Varicose veins of right lower extremity with both ulcer other part of foot and inflammation: Secondary | ICD-10-CM | POA: Diagnosis not present

## 2021-04-05 ENCOUNTER — Other Ambulatory Visit: Payer: Self-pay | Admitting: Cardiology

## 2021-04-05 DIAGNOSIS — I83215 Varicose veins of right lower extremity with both ulcer other part of foot and inflammation: Secondary | ICD-10-CM | POA: Diagnosis not present

## 2021-04-06 ENCOUNTER — Ambulatory Visit (HOSPITAL_COMMUNITY): Payer: PPO | Admitting: Nurse Practitioner

## 2021-04-08 ENCOUNTER — Other Ambulatory Visit (HOSPITAL_COMMUNITY): Payer: Self-pay

## 2021-04-08 MED ORDER — APIXABAN 5 MG PO TABS: 5.0000 mg | ORAL_TABLET | Freq: Two times a day (BID) | ORAL | 0 refills | Status: DC

## 2021-04-12 ENCOUNTER — Other Ambulatory Visit: Payer: Self-pay | Admitting: Family Medicine

## 2021-04-12 DIAGNOSIS — R600 Localized edema: Secondary | ICD-10-CM | POA: Diagnosis not present

## 2021-04-12 DIAGNOSIS — I872 Venous insufficiency (chronic) (peripheral): Secondary | ICD-10-CM | POA: Diagnosis not present

## 2021-04-12 NOTE — Telephone Encounter (Signed)
Yes okay 

## 2021-04-12 NOTE — Telephone Encounter (Signed)
Signed orders faxed.

## 2021-04-12 NOTE — Telephone Encounter (Signed)
Home health PT orders received 04/11/21 for Amanda Sellers ?Home health initiation orders: No.  ?Home health re-certification orders: Yes. ?Patient last seen by ordering physician for this condition: 02/24/21. Must be less than 90 days for re-certification and less than 30 days prior for initiation. Visit must have been for the condition the orders are being placed.  ?Patient meets criteria for Physician to sign orders: Yes.  ?      Current med list has been attached: No  ?      Orders placed on physicians desk for signature: 04/12/21 (date) ?If patient does not meet criteria for orders to be signed: pt was called to schedule appt. Appt is scheduled for 04/25/21.  ? ? ?

## 2021-04-13 ENCOUNTER — Other Ambulatory Visit: Payer: Self-pay

## 2021-04-13 ENCOUNTER — Ambulatory Visit (HOSPITAL_COMMUNITY)
Admission: RE | Admit: 2021-04-13 | Discharge: 2021-04-13 | Disposition: A | Discharge status: Home / Self Care | Payer: PPO | Source: Ambulatory Visit | Attending: Nurse Practitioner | Admitting: Nurse Practitioner

## 2021-04-13 ENCOUNTER — Encounter (HOSPITAL_COMMUNITY): Payer: Self-pay | Admitting: Nurse Practitioner

## 2021-04-13 VITALS — BP 166/60 | HR 57 | Ht 66.0 in | Wt 195.4 lb

## 2021-04-13 DIAGNOSIS — Z9221 Personal history of antineoplastic chemotherapy: Secondary | ICD-10-CM | POA: Insufficient documentation

## 2021-04-13 DIAGNOSIS — I4891 Unspecified atrial fibrillation: Secondary | ICD-10-CM | POA: Insufficient documentation

## 2021-04-13 DIAGNOSIS — L97929 Non-pressure chronic ulcer of unspecified part of left lower leg with unspecified severity: Secondary | ICD-10-CM | POA: Insufficient documentation

## 2021-04-13 DIAGNOSIS — Z7901 Long term (current) use of anticoagulants: Secondary | ICD-10-CM | POA: Insufficient documentation

## 2021-04-13 DIAGNOSIS — I890 Lymphedema, not elsewhere classified: Secondary | ICD-10-CM | POA: Insufficient documentation

## 2021-04-13 DIAGNOSIS — C8510 Unspecified B-cell lymphoma, unspecified site: Secondary | ICD-10-CM | POA: Insufficient documentation

## 2021-04-13 DIAGNOSIS — I480 Paroxysmal atrial fibrillation: Secondary | ICD-10-CM | POA: Diagnosis not present

## 2021-04-13 DIAGNOSIS — I5030 Unspecified diastolic (congestive) heart failure: Secondary | ICD-10-CM | POA: Diagnosis not present

## 2021-04-13 DIAGNOSIS — I110 Hypertensive heart disease with heart failure: Secondary | ICD-10-CM | POA: Insufficient documentation

## 2021-04-13 DIAGNOSIS — D6869 Other thrombophilia: Secondary | ICD-10-CM | POA: Diagnosis not present

## 2021-04-13 NOTE — Progress Notes (Signed)
Patient ID: Amanda Sellers, female   DOB: Feb 17, 1932, 88 y.o.   MRN: [id number] ? ? ? ? ? ? ? ? ? ? ?Primary Care Physician: Tammi Sou, MD ?Referring Physician: Dr. Ellyn Hack ? ? ?Amanda Sellers is a 88 y.o. female with a h/o  diastolic heart failure, afib, newly diagnosed with recent dx of lymphoma. She was admitted to [facility] 9/22 on IV dilt and given diuretics for acute on chronic systolic HF.  It was arranged for to undergo TEE DCCV.   She had exertional dyspnea with PND & orthopnea. She felt very tired & had little energy.  She  noted a few palpitations, but does not recall rapid irregular heart rates/rhythms.   ? ?She had TEE without thrombus and underwent DCCV that was successful with one shock of 120J. She did well post procedure and was seen by Dr. Jerilynn Mages. Croitoru and found stable for discharge.   She was negative 2700cc since admit and weight was down 4 lbs with IV diuretics.  D/C wt is 168. ? ?Plan on d/c was for  uninterrupted anticoagulation for next 30 days, preferably lifelong barring bleeding complications.  AFib clinic follow up in 1-2 weeks. Outpatient [facility], non-urgent.  ASA stopped.  ? ?She was seen in the afib clinic, 10/10 and found to be in afib with rvr. She went into afib short time after cardioversion. EKG showed afib at 130-140 bpm. She was  not on any drugs for rate control.  Is taking DOAC without fail. Continues with chemotherapy for lymphoma. ? ? She was started on BB and  returned to clinic 10/12 with better v rates. 80's with sitting and 90-100's with activity. She does not have any PND/orthopnea although she has slept better in her recliner for months now.No lower extremity edema. Her weight is stable at home. ? ?She has  finished chemotherapy for non- hodgkin's lymphoma and had f/u Pet scan 10/26,  and CA appears to be in remission, so per pt, no more chemo for right now. She has been on DOAC now x one month so will start attempts to restore SR. Discussed options  amiodarone or tikosyn and she would prefer amiodarone as she has two neighbors on amiodarone and they are both in rhythm and tolerating drug well. She is not interested in a hospital stay for drug loading. ?She reports rash with recent contrast dye. Amiodarone was started 200 mg bid, to then go down to 200 mg a day and then would discuss cardioversion. ? ?She asked to be seen 11/2,  in the afib clinic for increased shortness of breath yesterday and last night. She has slept in the recliner for months but did not feel as well last night and slept poorly, feeling more short of breath and fullness in her chest.She has noticed increased pedal edema. She did not take her amiodarone last night or this am. Ekg shows rate controlled afib at 82 bpm, qtc at 464 ms.PO on RA is 98%. Weight is up 2 lbs from last visit, 6 lbs since 10/17. Lasix dose was decreased to 40 mg daily from 40 mg am and 20 mg pm when seen by Dr. Ellyn Hack 10/22.. Pt has noticed weight climbing for several days. She is fairly comfortable when quiet and sitting.  ? ?She was asked to double lasix to 80 mg  x 3 days, and to reduce amiodarone 200 mg a day. She had lost 3 pounds on return to clinic several days later. Clinically, she  is much improved with more energy and less shortness of breath. Able to sleep well. Had enough energy to walk into clinic today without wheelchair and felt well enough to clean her house yesterday. Ideally,I would like to load amiodarone a while longer but will go ahead and  pursue DCCV sooner than later, as it is contributing to her heart failure. Compliant with blood thinner without missed doses On recent labs, TSH was elevated but daughter explained that she just started taking replacement correctly on an empty stomach, and sees PCP 11/16 and she expects him to adjust the dose. ? ?11/14-  Returns to afib clinic with a smile on her face and reporting all the activities she has been doing over the last week, having returned to  sinus rhythm after cardioversion. Much more energy and breathing is much improved.  Fluid status is stable. ? ?12/13- Returns to afib clinic for f/u and reports doing well No afib, no further shortness of breath or edema. She is back to her usual activities. Got a good report form the Ca unit recently. Tolerating amiodarone 200 mg a day. ? ?Return to afib clinic 4/18, reports that she is staying in [place]. Saw pulmonology yesterday for some c/o of shortness of breath but breathing much improved since staying in SR. Xray no active process, thyroid, liver studies drawn yesterday, and normal. MD said he would call hr after results of tests were reviewed. Fluid status normal. ? ?F/u in afib clinic 01/31/16, pt continues to do well without any afib. Her fluid status is normal. Last TSH, 3 months ago, showed mild increase and will check TSH and liver panel today. Her breathing is good other than the last week in the bitter cold weather  she does not breath as good outside.No bleeding issues with eliquis. Fluid status is stable on lasix. ? ?F/u in afib 01/26/17  for shortness of breath. Pt has been having shortness of breath for some while but contributed it to her knees and difficultly in ambulating. She was seen by pulmonology in July with improved PFT's and was OK to continue amiodarone. She was seen in November by Dr. Ellyn Hack and shortness of breath was thought to be stable/chronic. She feels that her shortness of breath has worsened since Christmas.  If she walks to the bathroom and back she is entirely out of breath. Sleeps on 2 pillows and has not felt the need to increase to elevate her head more at night. Ekg today shows junctional rhythm at 46 bpm. She is breathless speaking and started walking to clinic room but had to stop and get w/c. PO 99%. ? ?F/u in afib clinic 1/7, after reducing amiodarone to 1/2 tab a day as well BB to 1/2 tab a day for brady as well as increase in shortness of breath. She is feeling much better  today to the point she was able to walk to clinic and not use w/c. She is not breathless talking. ? ?F/u in afib clinic, 1/23, She feels great! Saw her pulmonologist recently and she said he was very pleased with her lung status. She is walking with a walker and observed coming into the clinic, no exertional dyspnea noted.  Her ? HR is Sinus in the 40's today, but she is not symptomatic. She checks her HR with a pulse ox several times a day and it usually runs around 53-60.  ? ?F/u in afib clinic, 08/15/17. She is feeling well. Just saw Dr. Lenna Gilford and was given a  good report. Ekg, SR with HR's in the 50's. 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 She has some chromic LE, which was worse after recent knee injections.. She did increase lasix for a couple of days and now back to baseline now. ? ?F/u in afib clinic, 1/8, she is doing very well. She is in SR and has not noted any afib. 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 No LLE. She walks with a walker, no falls. No shortness of breath. On amiodarone 100 mg qd. Notices a little numbness in her distal fingertips intermittently.No issues with anticoagulation. ? ?F/u in 07/31/18. Continues on amiodarone for afib management. She is in [place] today and has not noted any irregular heart rhythm. Her main complaint is that she has noted more LLE and has been back to sleeping in her recliner. She continues on lasix 40 mg bid. Avoids salt and salty foods and sits with her feet up during the day. No change in her health other than increase in  edema. She does drink 8 + glasses of water a day and may need to back off that some.weight is up 7 lbs when compared to last in person office visit in March. ? ?F/u 03/11/19. She states that she feels great. She is in SR, no appreciated afib. Continues on amiodarone.  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 No pedal edema or shortness of breath. No issues with eliquis with a CHA2DS2VASc score of 5. ? ?F/u in afib clinic,  09/16/19. She remains on amiodarone 100 mg daily and is staying in SR. She was hospitalized for fluid retention/shortness of breath in April 2021. Had a LHC, had some CAD but thought to be best treated medica

## 2021-04-13 NOTE — Patient Instructions (Signed)
Decrease Amiodarone to 1/2 tablet once a day ('100mg'$  total a day) ?

## 2021-04-18 ENCOUNTER — Other Ambulatory Visit (HOSPITAL_COMMUNITY): Payer: Self-pay

## 2021-04-18 MED ORDER — APIXABAN 5 MG PO TABS: 5.0000 mg | ORAL_TABLET | Freq: Two times a day (BID) | ORAL | 0 refills | Status: DC

## 2021-04-19 DIAGNOSIS — I890 Lymphedema, not elsewhere classified: Secondary | ICD-10-CM | POA: Diagnosis not present

## 2021-04-25 ENCOUNTER — Encounter: Payer: Self-pay | Admitting: Family Medicine

## 2021-04-25 ENCOUNTER — Ambulatory Visit (INDEPENDENT_AMBULATORY_CARE_PROVIDER_SITE_OTHER): Payer: PPO | Admitting: Family Medicine

## 2021-04-25 VITALS — BP 152/74 | HR 69 | Temp 97.9°F | Ht 66.0 in

## 2021-04-25 DIAGNOSIS — E039 Hypothyroidism, unspecified: Secondary | ICD-10-CM

## 2021-04-25 DIAGNOSIS — R531 Weakness: Secondary | ICD-10-CM | POA: Diagnosis not present

## 2021-04-25 DIAGNOSIS — M160 Bilateral primary osteoarthritis of hip: Secondary | ICD-10-CM

## 2021-04-25 DIAGNOSIS — M79604 Pain in right leg: Secondary | ICD-10-CM | POA: Diagnosis not present

## 2021-04-25 DIAGNOSIS — N1831 Chronic kidney disease, stage 3a: Secondary | ICD-10-CM | POA: Diagnosis not present

## 2021-04-25 DIAGNOSIS — G894 Chronic pain syndrome: Secondary | ICD-10-CM

## 2021-04-25 DIAGNOSIS — I509 Heart failure, unspecified: Secondary | ICD-10-CM | POA: Diagnosis not present

## 2021-04-25 DIAGNOSIS — R5381 Other malaise: Secondary | ICD-10-CM

## 2021-04-25 DIAGNOSIS — R609 Edema, unspecified: Secondary | ICD-10-CM | POA: Diagnosis not present

## 2021-04-25 MED ORDER — HYDROCODONE-ACETAMINOPHEN 7.5-325 MG PO TABS: ORAL_TABLET | ORAL | 0 refills | Status: DC

## 2021-04-25 NOTE — Progress Notes (Signed)
OFFICE VISIT ? ?04/25/2021 ? ?CC:  ?Chief Complaint  ?Patient presents with  ? Edema  ? Chronic Kidney Disease  ? ?Patient is a 88 y.o. female who presents for 2 mo f/u edema and CRI III. ?A/P as of last visit: ?"Right lower leg lymphedema, recurrent superficial ulcerations and left lower leg, recurrent cellulitis in left lower leg. - improved/controlled since seeing vein clinic and getting UNNA boot.  No complaints of drainage or breakthrough pain. She will continue wearing leg wrap until her next appointment with the vein specialist on Tuesday (01/29/21).  ?Cont vicodin 1 tab q4h for chronic bilat LL pain. ?  ?RIGHT Hip pain - controlled. Recent ED visit imaging showed no evidence of fracture.  ?Hx of L hip fx, ORIF 05/2020.  Spoke with the patient about the need for DEXA and likely start osteoporosis treatment--will wait until her current issues are more ironed-out." ? ?INTERIM HX: ?TSH was up 03/10/21 so I increased her T4 to 112 mcg qd. ?Cardiology f/u 04/13/21, no changes (lasix 40 bid). ?She says her right lower leg swelling is significantly improved with Unna boot--goes to vein clinic for weekly changes.  She says she still has a small healing venous stasis ulcer on the top of her right foot. ? ?Bilateral hip pain and right lower extremity pain--pain level about the same/decent control, requiring 1 hydrocodone 7.5/325 tab 2-3 times a day. ?This allows her sufficient improvement in quality of life and functioning. ?She usually uses a wheelchair of her everywhere she goes but with the help of PT she does get up and ambulate some, short distances.  She never walks unassisted, though. ? ?ROS as above, plus--> no fevers, no CP, no SOB, no wheezing, no cough, no dizziness, no HAs, no rashes, no melena/hematochezia.  No polyuria or polydipsia.    No focal weakness, paresthesias, or tremors.  No acute vision or hearing abnormalities.  No dysuria or unusual/new urinary urgency or frequency.  No n/v/d or abd pain.  No  palpitations.   ? ?Past Medical History:  ?Diagnosis Date  ? Anemia of chronic disease   ? Anticoagulation adequate, Eliquis 10/16/2014  ? Anxiety   ? clonaz helps this AND her breathing  ? Cholelithiasis without cholecystitis 07/2020  ? noted on CT abd/pelv at [facility]  ? Chronic combined systolic and diastolic congestive heart failure ([place]) 10/16/2014  ? Complicated by atrial fibrillation; Echo 07/23/14: mild LVH, EF 50-55%, Gr 2 DD.;  Echo May 15, 2019: EF 35 to 40%, GRII DD.  (In setting of CHF exacerbation, class III)-> baseline class II symptoms. 09/2019 EF 55-60%, nl wall motion and LV fxn, grd II DD, mod MR.  ? Chronic renal insufficiency, stage III (moderate) (HCC)   ? GFR @ 50  ? COPD (chronic obstructive pulmonary disease) ([place])   ? recently noted on Xray, does not have any problems  ? Diverticulosis   ? Follicular lymphoma ([place])   ? Non Hodgkins B cell lymphoma; ?s/p chemo summer 2016--in remission still as of 09/2018: continue obs q 38mo ? Hiatal hernia 07/2020  ? moderate size-[facility] ? HLD (hyperlipidemia)   ?  06/2014  ? HTN (hypertension)   ? Hypothyroidism   ? IFG (impaired fasting glucose)   ? Lip cancer   ? Dr. BJanace Hoardexcised this: invasive SCC--no sign of cancer at ENT f/u 10/2015  ? Microcytic anemia   ? transfused 3 U total in hosp 07/2014  ? Obesity (  BMI 30-39.9)   ? Pancytopenia due to antineoplastic chemotherapy [facility])   ? Persistent atrial fibrillation ([place])   ? With RVR; elec cardioversion 11/30/14.  On Amio since 10/2014; Dr. Lenna Gilford following her from pulm standpoint regarding this med.  ? Positive occult stool blood test 08/08/2014  ? Endoscopies ok 08/2014  ? Primary osteoarthritis of both knees 04/2016  ? Dr. Gladstone Lighter: bone on bone--to get euflexxa to both knees.  ? Protein calorie malnutrition ([place]) 2022  ? Pulmonary hypertension due to left heart disease ([place])   ? RLCP 05/19/2019: EF 35-40%. D1 50%, RI 70%, dLM-LAD 30%.  mPAP 43 mmHg - (62/19  mmHg), PCWP  28 mmHg (V wave 41 mmHg) LVEDP 34 mmHg  ? Pulmonary metastases 07/24/2014  ? S/p left hip fracture 05/2020  ? ORIF  ? Vitamin B 12 deficiency   ? Vitamin D deficiency   ? dose increased to 50 K U tab TWICE weekly 04/2017  ? ? ?Past Surgical History:  ?Procedure Laterality Date  ? ABDOMINAL HYSTERECTOMY  1978  ? BONE MARROW BIOPSY  07/24/2014  ? BREAST BIOPSY    ? CARDIOVERSION N/A 10/16/2014  ? Procedure: CARDIOVERSION;  Surgeon: Lelon Perla, MD;  Location: [place];  Service: Cardiovascular;  Laterality: N/A;  ? CARDIOVERSION N/A 11/30/2014  ? Procedure: CARDIOVERSION;  Surgeon: Larey Dresser, MD;  Location: [place];  Service: Cardiovascular;  Laterality: N/A;  ? CATARACT EXTRACTION  2020  ? OU: Dr. Katy Fitch  ? COLONOSCOPY  09/23/2014  ? small hemorrhoids, otherwise normal (performed for IDA and heme+ stool)  ? COLONOSCOPY    ? FRACTURE SURGERY    ? HIP FRACTURE SURGERY    ? May 2022  ? INTRAMEDULLARY (IM) NAIL INTERTROCHANTERIC Left 05/25/2020  ? Procedure: INTRAMEDULLARY (IM) NAIL INTERTROCHANTRIC;  Surgeon: Erle Crocker, MD;  Location: [place];  Service: Orthopedics;  Laterality: Left;  ? MASS EXCISION Left 07/15/2015  ? Left lower lip mass--invasive SCC w/negative margins.  Procedure: EXCISION MASS;  Surgeon: Melissa Montane, MD;  Location: [place];  Service: ENT;  Laterality: Left;  Wedge excision left lower lip mass  ? NM MYOVIEW LTD  10/29/2014  ? medium size mild surgery defect in the mid anterior and apical anterior location suggestive of breast attenuation. No reversibility. LOW RISK  ? PFTs  02/2015  ? Restriction with diffusion defect: cardiologist referred her to pulm to help interpret PFTs and decide whether she has amiodarone toxicity  ? RIGHT/LEFT HEART CATH AND CORONARY ANGIOGRAPHY N/A 05/19/2019  ? Procedure: RIGHT/LEFT HEART CATH AND CORONARY ANGIOGRAPHY;  Surgeon: Troy Sine, MD;  Location: [place] CV LAB;; EF 35-40%. D1 50%, RI 70%, dLM-LAD 30%.  mPAP 43 mmHg -  (62/19 mmHg), PCWP  28 mmHg (V wave 41 mmHg) LVEDP 34 mmHg  ? TEE WITHOUT CARDIOVERSION N/A 10/16/2014  ? Procedure: TRANSESOPHAGEAL ECHOCARDIOGRAM (TEE);  Surgeon: Lelon Perla, MD;  Location[facility] END[facility]COPY;  Service: Cardiovascular;  Laterality: N/A;  ? TRANSTHORACIC ECHOCARDIOGRAM  10/09/2019  ? EF 55 to 60%.  GRII DD.  No R WMA.  Normal PAP and RAP.  Severe LA dilation.  Mild RA dilation.  Moderate MR (likely related to annular dilation in the setting of severe LAE.  Mild aortic valve sclerosis-no stenosis.  ? TRANSTHORACIC ECHOCARDIOGRAM  05/15/2019  ? Acute CHF exacerbation:  EF 35-40%.  Moderately reduced function.  (Previous EF was 55 to 60%) global HK.  GRII DD.  Mild LA dilation.  Normal RV size.  ?  UPPER GI ENDOSCOPY  09/23/2014  ? small hiatus hernia, nodules in stomach biopsied (chronic active erosive atrophic gastritis with intestinal metaplasia--no dysplasia or malignancy) otherwise normal  ? ? ?Outpatient Medications Prior to Visit  ?Medication Sig Dispense Refill  ? amiodarone (PACERONE) 200 MG tablet Take 0.5 tablets (100 mg total) by mouth daily.    ? apixaban (ELIQUIS) 5 MG TABS tablet Take 1 tablet (5 mg total) by mouth 2 (two) times daily. 42 tablet 0  ? cyanocobalamin (,VITAMIN B-12,) 1000 MCG/ML injection INJECT 1 ML INTO THE SKIN EVERY 30 DAYS. 3 mL 3  ? diclofenac Sodium (VOLTAREN) 1 % GEL APPLY 2 G TOPICALLY 4 (FOUR) TIMES DAILY. APPLY TO LE JOINTS THAT ARE IN PAIN. 300 g 1  ? ENTRESTO 24-26 MG TAKE 1 TABLET BY MOUTH TWICE A DAY 60 tablet 11  ? ferrous sulfate 325 (65 FE) MG tablet Take 1 tablet (325 mg total) by mouth 2 (two) times daily with a meal. 30 tablet 1  ? fish oil-omega-3 fatty acids 1000 MG capsule Take 2 g by mouth daily.    ? fluticasone (CUTIVATE) 0.05 % cream Apply to affected area of lower legs bid prn 60 g 2  ? furosemide (LASIX) 40 MG tablet Taking one tablet by mouth twice daily    ? hydrOXYzine (VISTARIL) 25 MG capsule TAKE 1-2 CAP BY MOUTH AT BED TIME FOR SLEEP 60  capsule 1  ? levothyroxine (SYNTHROID) 112 MCG tablet TAKE 1 TABLET BY MOUTH EVERY DAY 30 tablet 0  ? OXYGEN Inhale into the lungs. 1Lt at bedtime  as needed    ? Potassium Chloride ER 20 MEQ TBCR TAKE 1 TABL

## 2021-04-26 LAB — BASIC METABOLIC PANEL
BUN: 20 mg/dL (ref 6–23)
CO2: 31 mEq/L (ref 19–32)
Calcium: 9.3 mg/dL (ref 8.4–10.5)
Chloride: 103 mEq/L (ref 96–112)
Creatinine, Ser: 1.01 mg/dL (ref 0.40–1.20)
GFR: 50.32 mL/min — ABNORMAL LOW (ref >60.00)
Glucose, Bld: 100 mg/dL — ABNORMAL HIGH (ref 70–99)
Potassium: 4 mEq/L (ref 3.5–5.1)
Sodium: 141 mEq/L (ref 135–145)

## 2021-04-26 LAB — VITAMIN D 25 HYDROXY (VIT D DEFICIENCY, FRACTURES): VITD: 51.37 ng/mL (ref 30.00–100.00)

## 2021-04-26 LAB — TSH: TSH: 3.56 u[IU]/mL (ref 0.35–5.50)

## 2021-05-03 ENCOUNTER — Other Ambulatory Visit: Payer: Self-pay | Admitting: Family Medicine

## 2021-05-03 ENCOUNTER — Other Ambulatory Visit: Payer: Self-pay

## 2021-05-03 DIAGNOSIS — I890 Lymphedema, not elsewhere classified: Secondary | ICD-10-CM | POA: Diagnosis not present

## 2021-05-03 NOTE — Telephone Encounter (Signed)
Confirmed with pt's daughter, [name] that pt would have enough for a few more days. [place] and confirmed they have only have a qty of 46 for Norco 5-'325mg'$  tabs on hand currently but [facility] [place] has qty of 180 on hand.  ?

## 2021-05-03 NOTE — Telephone Encounter (Addendum)
Pt wants the 5-'325mg'$  sent to [facility]. Provider verbally made aware. He would like to know if pt is out of medication currently and if [facility] has enough for pt to receive medication. LM for pt's daughter [name]. Will follow up with pharmacy after their lunch break (130-2p).  ? ? ? ?

## 2021-05-03 NOTE — Telephone Encounter (Signed)
I am a little confused. ?To what pharmacy does she want the 5/325 Vicodin sent? ?

## 2021-05-03 NOTE — Telephone Encounter (Signed)
Pt's daughter called wanting to get original rx for Norco 5-'325mg'$  due to cost of medication. Last rx completed 2/2 (180,0) at [facility]. Daughter called to different pharmacies to see if they had 5-'325mg'$  available instead of 7.5-'325mg'$ . Pt has not picked up [facility] [place] ? ?Please review and advise. Med pending ?

## 2021-05-04 DIAGNOSIS — E039 Hypothyroidism, unspecified: Secondary | ICD-10-CM | POA: Diagnosis not present

## 2021-05-04 DIAGNOSIS — K5790 Diverticulosis of intestine, part unspecified, without perforation or abscess without bleeding: Secondary | ICD-10-CM | POA: Diagnosis not present

## 2021-05-04 DIAGNOSIS — K449 Diaphragmatic hernia without obstruction or gangrene: Secondary | ICD-10-CM | POA: Diagnosis not present

## 2021-05-04 DIAGNOSIS — D631 Anemia in chronic kidney disease: Secondary | ICD-10-CM | POA: Diagnosis not present

## 2021-05-04 DIAGNOSIS — J449 Chronic obstructive pulmonary disease, unspecified: Secondary | ICD-10-CM | POA: Diagnosis not present

## 2021-05-04 DIAGNOSIS — Z7901 Long term (current) use of anticoagulants: Secondary | ICD-10-CM | POA: Diagnosis not present

## 2021-05-04 DIAGNOSIS — I4819 Other persistent atrial fibrillation: Secondary | ICD-10-CM | POA: Diagnosis not present

## 2021-05-04 DIAGNOSIS — L03116 Cellulitis of left lower limb: Secondary | ICD-10-CM | POA: Diagnosis not present

## 2021-05-04 DIAGNOSIS — I872 Venous insufficiency (chronic) (peripheral): Secondary | ICD-10-CM | POA: Diagnosis not present

## 2021-05-04 DIAGNOSIS — E669 Obesity, unspecified: Secondary | ICD-10-CM | POA: Diagnosis not present

## 2021-05-04 DIAGNOSIS — I130 Hypertensive heart and chronic kidney disease with heart failure and stage 1 through stage 4 chronic kidney disease, or unspecified chronic kidney disease: Secondary | ICD-10-CM | POA: Diagnosis not present

## 2021-05-04 DIAGNOSIS — S7292XD Unspecified fracture of left femur, subsequent encounter for closed fracture with routine healing: Secondary | ICD-10-CM | POA: Diagnosis not present

## 2021-05-04 DIAGNOSIS — E785 Hyperlipidemia, unspecified: Secondary | ICD-10-CM | POA: Diagnosis not present

## 2021-05-04 DIAGNOSIS — N183 Chronic kidney disease, stage 3 unspecified: Secondary | ICD-10-CM | POA: Diagnosis not present

## 2021-05-04 DIAGNOSIS — Z90710 Acquired absence of both cervix and uterus: Secondary | ICD-10-CM | POA: Diagnosis not present

## 2021-05-04 DIAGNOSIS — C009 Malignant neoplasm of lip, unspecified: Secondary | ICD-10-CM | POA: Diagnosis not present

## 2021-05-04 DIAGNOSIS — I5042 Chronic combined systolic (congestive) and diastolic (congestive) heart failure: Secondary | ICD-10-CM | POA: Diagnosis not present

## 2021-05-04 DIAGNOSIS — Z993 Dependence on wheelchair: Secondary | ICD-10-CM | POA: Diagnosis not present

## 2021-05-04 DIAGNOSIS — C8590 Non-Hodgkin lymphoma, unspecified, unspecified site: Secondary | ICD-10-CM | POA: Diagnosis not present

## 2021-05-04 DIAGNOSIS — M170 Bilateral primary osteoarthritis of knee: Secondary | ICD-10-CM | POA: Diagnosis not present

## 2021-05-04 DIAGNOSIS — Z9981 Dependence on supplemental oxygen: Secondary | ICD-10-CM | POA: Diagnosis not present

## 2021-05-04 DIAGNOSIS — F419 Anxiety disorder, unspecified: Secondary | ICD-10-CM | POA: Diagnosis not present

## 2021-05-04 DIAGNOSIS — L97811 Non-pressure chronic ulcer of other part of right lower leg limited to breakdown of skin: Secondary | ICD-10-CM | POA: Diagnosis not present

## 2021-05-04 DIAGNOSIS — Z79891 Long term (current) use of opiate analgesic: Secondary | ICD-10-CM | POA: Diagnosis not present

## 2021-05-04 MED ORDER — HYDROCODONE-ACETAMINOPHEN 5-325 MG PO TABS: ORAL_TABLET | ORAL | 0 refills | Status: DC

## 2021-05-04 NOTE — Telephone Encounter (Signed)
LM for pt regarding medication ?

## 2021-05-05 ENCOUNTER — Encounter: Payer: Self-pay | Admitting: Family Medicine

## 2021-05-05 NOTE — Telephone Encounter (Signed)
Pt advised refill sent. °

## 2021-05-10 DIAGNOSIS — I890 Lymphedema, not elsewhere classified: Secondary | ICD-10-CM | POA: Diagnosis not present

## 2021-05-11 ENCOUNTER — Other Ambulatory Visit: Payer: Self-pay | Admitting: Family Medicine

## 2021-05-17 DIAGNOSIS — I890 Lymphedema, not elsewhere classified: Secondary | ICD-10-CM | POA: Diagnosis not present

## 2021-05-20 DIAGNOSIS — I890 Lymphedema, not elsewhere classified: Secondary | ICD-10-CM | POA: Diagnosis not present

## 2021-05-25 ENCOUNTER — Other Ambulatory Visit: Payer: Self-pay | Admitting: Family Medicine

## 2021-05-25 DIAGNOSIS — I509 Heart failure, unspecified: Secondary | ICD-10-CM | POA: Diagnosis not present

## 2021-05-25 DIAGNOSIS — R609 Edema, unspecified: Secondary | ICD-10-CM | POA: Diagnosis not present

## 2021-05-26 DIAGNOSIS — I890 Lymphedema, not elsewhere classified: Secondary | ICD-10-CM | POA: Diagnosis not present

## 2021-06-02 DIAGNOSIS — K5790 Diverticulosis of intestine, part unspecified, without perforation or abscess without bleeding: Secondary | ICD-10-CM | POA: Diagnosis not present

## 2021-06-02 DIAGNOSIS — I5042 Chronic combined systolic (congestive) and diastolic (congestive) heart failure: Secondary | ICD-10-CM | POA: Diagnosis not present

## 2021-06-02 DIAGNOSIS — Z993 Dependence on wheelchair: Secondary | ICD-10-CM | POA: Diagnosis not present

## 2021-06-02 DIAGNOSIS — M170 Bilateral primary osteoarthritis of knee: Secondary | ICD-10-CM | POA: Diagnosis not present

## 2021-06-02 DIAGNOSIS — Z79891 Long term (current) use of opiate analgesic: Secondary | ICD-10-CM | POA: Diagnosis not present

## 2021-06-02 DIAGNOSIS — J449 Chronic obstructive pulmonary disease, unspecified: Secondary | ICD-10-CM | POA: Diagnosis not present

## 2021-06-02 DIAGNOSIS — L03116 Cellulitis of left lower limb: Secondary | ICD-10-CM | POA: Diagnosis not present

## 2021-06-02 DIAGNOSIS — D631 Anemia in chronic kidney disease: Secondary | ICD-10-CM | POA: Diagnosis not present

## 2021-06-02 DIAGNOSIS — I872 Venous insufficiency (chronic) (peripheral): Secondary | ICD-10-CM | POA: Diagnosis not present

## 2021-06-02 DIAGNOSIS — I4819 Other persistent atrial fibrillation: Secondary | ICD-10-CM | POA: Diagnosis not present

## 2021-06-02 DIAGNOSIS — E039 Hypothyroidism, unspecified: Secondary | ICD-10-CM | POA: Diagnosis not present

## 2021-06-02 DIAGNOSIS — I130 Hypertensive heart and chronic kidney disease with heart failure and stage 1 through stage 4 chronic kidney disease, or unspecified chronic kidney disease: Secondary | ICD-10-CM | POA: Diagnosis not present

## 2021-06-02 DIAGNOSIS — C8590 Non-Hodgkin lymphoma, unspecified, unspecified site: Secondary | ICD-10-CM | POA: Diagnosis not present

## 2021-06-02 DIAGNOSIS — F419 Anxiety disorder, unspecified: Secondary | ICD-10-CM | POA: Diagnosis not present

## 2021-06-02 DIAGNOSIS — N183 Chronic kidney disease, stage 3 unspecified: Secondary | ICD-10-CM | POA: Diagnosis not present

## 2021-06-02 DIAGNOSIS — Z90710 Acquired absence of both cervix and uterus: Secondary | ICD-10-CM | POA: Diagnosis not present

## 2021-06-02 DIAGNOSIS — E785 Hyperlipidemia, unspecified: Secondary | ICD-10-CM | POA: Diagnosis not present

## 2021-06-02 DIAGNOSIS — C009 Malignant neoplasm of lip, unspecified: Secondary | ICD-10-CM | POA: Diagnosis not present

## 2021-06-02 DIAGNOSIS — S7292XD Unspecified fracture of left femur, subsequent encounter for closed fracture with routine healing: Secondary | ICD-10-CM | POA: Diagnosis not present

## 2021-06-02 DIAGNOSIS — Z9981 Dependence on supplemental oxygen: Secondary | ICD-10-CM | POA: Diagnosis not present

## 2021-06-02 DIAGNOSIS — Z7901 Long term (current) use of anticoagulants: Secondary | ICD-10-CM | POA: Diagnosis not present

## 2021-06-02 DIAGNOSIS — E669 Obesity, unspecified: Secondary | ICD-10-CM | POA: Diagnosis not present

## 2021-06-02 DIAGNOSIS — K449 Diaphragmatic hernia without obstruction or gangrene: Secondary | ICD-10-CM | POA: Diagnosis not present

## 2021-06-02 DIAGNOSIS — L97811 Non-pressure chronic ulcer of other part of right lower leg limited to breakdown of skin: Secondary | ICD-10-CM | POA: Diagnosis not present

## 2021-06-16 ENCOUNTER — Other Ambulatory Visit: Payer: Self-pay | Admitting: Family Medicine

## 2021-06-16 MED ORDER — HYDROCODONE-ACETAMINOPHEN 5-325 MG PO TABS
ORAL_TABLET | ORAL | 0 refills | Status: DC
Start: 2021-06-16 — End: 2021-08-01

## 2021-06-16 NOTE — Telephone Encounter (Signed)
Requesting: Norco Contract:11/04/18 UDS: N/A Last Visit: 04/25/21 Next Visit: 06/27/21 Last Refill: 05/04/21(180,0)  Please Advise. Meds pending

## 2021-06-25 DIAGNOSIS — I509 Heart failure, unspecified: Secondary | ICD-10-CM | POA: Diagnosis not present

## 2021-06-25 DIAGNOSIS — R609 Edema, unspecified: Secondary | ICD-10-CM | POA: Diagnosis not present

## 2021-06-27 ENCOUNTER — Encounter: Payer: Self-pay | Admitting: Family Medicine

## 2021-06-27 ENCOUNTER — Ambulatory Visit (INDEPENDENT_AMBULATORY_CARE_PROVIDER_SITE_OTHER): Payer: PPO | Admitting: Family Medicine

## 2021-06-27 VITALS — BP 122/46 | HR 58 | Temp 98.0°F | Ht 66.0 in | Wt 196.4 lb

## 2021-06-27 DIAGNOSIS — M25551 Pain in right hip: Secondary | ICD-10-CM | POA: Diagnosis not present

## 2021-06-27 DIAGNOSIS — G894 Chronic pain syndrome: Secondary | ICD-10-CM

## 2021-06-27 DIAGNOSIS — M25552 Pain in left hip: Secondary | ICD-10-CM | POA: Diagnosis not present

## 2021-06-27 DIAGNOSIS — R600 Localized edema: Secondary | ICD-10-CM | POA: Diagnosis not present

## 2021-06-27 DIAGNOSIS — E039 Hypothyroidism, unspecified: Secondary | ICD-10-CM | POA: Diagnosis not present

## 2021-06-27 DIAGNOSIS — Z8639 Personal history of other endocrine, nutritional and metabolic disease: Secondary | ICD-10-CM

## 2021-06-27 DIAGNOSIS — Z7901 Long term (current) use of anticoagulants: Secondary | ICD-10-CM | POA: Diagnosis not present

## 2021-06-27 MED ORDER — DICLOFENAC SODIUM 1 % EX GEL
2.0000 g | Freq: Four times a day (QID) | CUTANEOUS | 1 refills | Status: DC
Start: 2021-06-27 — End: 2021-09-28

## 2021-06-27 MED ORDER — HYDROXYZINE PAMOATE 25 MG PO CAPS: ORAL_CAPSULE | ORAL | 1 refills | Status: DC

## 2021-06-27 MED ORDER — LEVOTHYROXINE SODIUM 112 MCG PO TABS: 112.0000 ug | ORAL_TABLET | Freq: Every day | ORAL | 1 refills | Status: DC

## 2021-06-27 NOTE — Progress Notes (Signed)
OFFICE VISIT  06/27/2021  CC:  Chief Complaint  Patient presents with   Chronic Kidney Disease    Pt is fasting   Pain   HPI:    Patient is a 88 y.o. female who presents accompanied by her daughter Lynelle Smoke for 68-monthfollow-up chronic lower extremity edema, chronic pain syndrome, chronic renal insufficiency. A/P as of last visit: "#1 right lower leg lymphedema. Controlled/improved with weekly Unna boot application per vein specialist clinic. Continue Lasix 40 mg twice a day. Electrolytes and creatinine checked today.   #2 chronic pain syndrome.  She has bilateral hip osteoarthritis, is status post left hip fracture with subsequent ORIF May 2022. Right lower leg pain--stable. Pain control with Norco 7.5/325, 1 tab 2-3 times a day.  New prescription for #180 today.   #3 chronic generalized weakness/debilitated patient. Continue home health PT/nursing.   #4 chronic renal insufficiency stage III. Avoiding NSAIDs. Watching electrolytes and creatinine closely in the setting of chronic diuresis for her edema.   #5 hypothyroidism. She is on amiodarone. I increased her levothyroxine dose to 112 mcg a day about 6 weeks ago. Recheck TSH today."   INTERIM HX: Labs all stable last visit.  SDevynnis feeling well. She got pumps for legs a couple weeks ago and says these have worked wonders.  She wears them for an hour in the morning and an hour in the evening and notes that since she started these she has not had any swelling. All of her ulcers on legs have completely healed. She continues to take 40 mg furosemide twice a day.  Her pain in her low back and hips is minimal as long as she takes 1 hydrocodone 5-3 25 every 6 hours.  This allows her to be at maximum functioning and quality of life.  PMP AWARE reviewed today: most recent rx for vicodin was filled 06/16/21, # 180, rx by me. No red flags.  ROS as above, plus--> no fevers, no CP, no SOB, no wheezing, no cough, no dizziness, no  HAs, no rashes, no melena/hematochezia.  No polyuria or polydipsia.  No myalgias or arthralgias.  No focal weakness, paresthesias, or tremors.  No acute vision or hearing abnormalities.  No dysuria or unusual/new urinary urgency or frequency.  No recent changes in lower legs. No n/v/d or abd pain.  No palpitations.    Past Medical History:  Diagnosis Date   Anemia of chronic disease    Anticoagulation adequate, Eliquis 10/16/2014   Anxiety    clonaz helps this AND her breathing   Cholelithiasis without cholecystitis 07/2020   noted on CT abd/pelv at [facility]   Chronic combined systolic and diastolic congestive heart failure (HEcho 075/10/2583  Complicated by atrial fibrillation; Echo 07/23/14: mild LVH, EF 50-55%, Gr 2 DD.;  Echo May 15, 2019: EF 35 to 40%, GRII DD.  (In setting of CHF exacerbation, class III)-> baseline class II symptoms. 09/2019 EF 55-60%, nl wall motion and LV fxn, grd II DD, mod MR.   Chronic renal insufficiency, stage III (moderate) (HCC)    GFR @ 50   COPD [place]chronic obstructive pulmonary disease) [place]    recently noted on Xray, does not have any problems   Diverticulosis    [place]licular [place]    Non Hodgkins B cell lymphoma; s/p chemo summer 2016--in remission still as of 09/2018: continue obs q 619mo Hiatal hernia 07/2020   moderate size-[facility]  06[facility]2014   HTN (hypertension)    Hypothyroidism    IFG (impaired fasting glucose)    Lip cancer    Dr. 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  On Amio since 10/2014; Dr. Lenna Gilford following her from pulm standpoint regarding this med.   Positive occult stool blood test  08/08/2014   Endoscopies ok 08/2014   Primary osteoarthritis of both knees 04/2016   Dr. Gladstone Lighter: bone on bone--to get euflexxa to both knees.   Protein calorie malnutrition ([place]) 2022   Pulmonary hypertension due to left heart disease ([place])    RLCP 05/19/2019: EF 35-40%. D1 50%, RI 70%, dLM-LAD 30%.  mPAP 43 mmHg - (62/19 mmHg), PCWP  28 mmHg (V wave 41 mmHg) LVEDP 34 mmHg   Pulmonary metastases 07/24/2014   S/p left hip fracture 05/2020   ORIF   Venous stasis ulcer (HCC)    Right leg   Vitamin B 12 deficiency    Vitamin D deficiency    dose increased to 50 K U tab TWICE weekly 04/2017    Past Surgical History:  Procedure Laterality Date   ABDOMINAL HYSTERECTOMY  1978   BONE MARROW BIOPSY  07/24/2014   BREAST BIOPSY     CARDIOVERSION N/A 10/16/2014   Procedure: CARDIOVERSION;  Surgeon: Lelon Perla, MD;  [facility] ENDOSCOPY;  Service: Cardiovascular;  Laterality: N/A;   CARDIOVERSION N/A 11/30/2014   Procedure: CARDIOVERSION;  Surgeon: Larey Dresser, MD;  Location: [place];  Service: Cardiovascular;  Laterality: N/A;   CATARACT EXTRACTION  2020   OU: Dr. Katy Fitch   COLONOSCOPY  09/23/2014   small hemorrhoids, otherwise normal (performed for IDA and heme+ stool)   COLONOSCOPY     [place]     May 2022   INTRAMEDULLARY (IM) NAIL INTERTROCHANTERIC Left 05/25/2020   Procedure: INTRAMEDULLARY (IM) NAIL INTERTROCHANTRIC;  Surgeon: Erle Crocker, MD;  Location: [place];  Service: Orthopedics;  Laterality: Left;   MASS EXCISION Left 07/15/2015   Left lower lip mass--invasive SCC w/negative margins.  Procedure: EXCISION MASS;  Surgeon: Melissa Montane, MD;  Location: [place];  Service: ENT;  Laterality: Left;  Wedge excision left lower lip mass   NM MYOVIEW LTD  10/29/2014   medium size mild surgery defect in the mid anterior and apical anterior location suggestive of breast attenuation. No reversibility. LOW RISK   PFTs  02/2015   Restriction  with diffusion defect: cardiologist referred her to pulm to help interpret PFTs and decide whether she has amiodarone toxicity   RIGHT/LEFT HEART CATH AND CORONARY ANGIOGRAPHY N/A 05/19/2019   Procedure: RIGHT/LEFT HEART CATH AND CORONARY ANGIOGRAPHY;  Surgeon: Troy Sine, MD;  Location: [facility];; EF 35-40%. D1 50%, RI 70%, dLM-LAD 30%.  mPAP 43 mmHg - (62/19 mmHg), PCWP  28 mmHg (V wave 41 mmHg) LVEDP 34 mmHg   TEE WITHOUT CARDIOVERSION N/A 10/16/2014   Procedure: TRANSESOPHAGEAL ECHOCARDIOGRAM (TEE);  Surgeon: Lelon Perla, MD;  Location[facility]COPY;  Service: Cardiovascular;  Laterality: N/A;   TRANSTHORACIC ECHOCARDIOGRAM  10/09/2019   EF 55 to 60%.  GRII DD.  No R WMA.  Normal PAP and RAP.  Severe LA dilation.  Mild RA dilation.  Moderate MR (likely related to annular dilation in the setting of severe LAE.  Mild aortic valve sclerosis-no stenosis.   TRANSTHORACIC ECHOCARDIOGRAM  05/15/2019   Acute CHF exacerbation:  EF 35-40%.  Moderately reduced function.  (Previous EF was 55 to 60%) global HK.  GRII DD.  Mild LA dilation.  Normal RV size.   UPPER GI ENDOSCOPY  09/23/2014   small hiatus hernia, nodules in stomach biopsied (chronic active erosive atrophic gastritis with intestinal metaplasia--no dysplasia or malignancy) otherwise normal    Outpatient Medications Prior to Visit  Medication Sig Dispense Refill   amiodarone (PACERONE) 200 MG tablet Take 0.5 tablets (100 mg total) by mouth daily.     apixaban (ELIQUIS) 5 MG TABS tablet Take 1 tablet (5 mg total) by mouth 2 (two) times daily. 42 tablet 0   cyanocobalamin (,VITAMIN B-12,) 1000 MCG/ML injection INJECT 1 ML INTO THE SKIN EVERY 30 DAYS. 3 mL 3   ENTRESTO 24-26 MG TAKE 1 TABLET BY MOUTH TWICE A DAY 60 tablet 11   ferrous sulfate 325 (65 FE) MG tablet Take 1 tablet (325 mg total) by mouth 2 (two) times daily with a meal. 30 tablet 1   fish oil-omega-3 fatty acids 1000 MG capsule Take 2 g by mouth daily.      fluticasone (CUTIVATE) 0.05 % cream Apply to affected area of lower legs bid prn 60 g 2   furosemide (LASIX) 40 MG tablet Taking one tablet by mouth twice daily     HYDROcodone-acetaminophen (NORCO/VICODIN) 5-325 MG tablet 1-2 tabs po tid prn pain 180 tablet 0   OXYGEN Inhale into the lungs. 1Lt at bedtime  as needed     Potassium Chloride ER 20 MEQ TBCR TAKE 1 TABLET BY MOUTH EVERY DAY 90 tablet 0   rosuvastatin (CRESTOR) 20 MG tablet TAKE 1 TABLET BY MOUTH EVERY DAY 90 tablet 3   Vitamin D, Ergocalciferol, (DRISDOL) 1.25 MG (50000 UNIT) CAPS capsule Take 1 capsule (50,000 Units total) by mouth 2 (two) times a week. 24 capsule 3   diclofenac Sodium (VOLTAREN) 1 % GEL APPLY 2 G TOPICALLY 4 (FOUR) TIMES DAILY. APPLY TO LE JOINTS THAT ARE IN PAIN. 300 g 1   hydrOXYzine (VISTARIL) 25 MG capsule TAKE 1 TO 2 CAPSULES BY MOUTH AT BEDTIME FOR SLEEP 180 capsule 0   levothyroxine (SYNTHROID) 112 MCG tablet TAKE 1 TABLET BY MOUTH EVERY DAY 90 tablet 0   No facility-administered medications prior to visit.    Allergies  Allergen Reactions   Aspirin Itching   Calcium Alginate Other (See Comments)    Excessive burning   Morphine     Other reaction(s): Hallucinations   Iodinated Contrast Media Rash and Other (See Comments)    CT Contrast    ROS As per HPI  PE:    06/27/2021    2:45 PM 04/25/2021    2:34 PM 04/13/2021    9:49 AM  Vitals with BMI  Height _0  _1  _2   Weight 196 lbs 6 oz  195 lbs 6 oz  BMI 83.41  96.22  Systolic 297 989 211  Diastolic 46 74 60  Pulse 58 69 57   Physical Exam  General: Alert and well-appearing Affect: Pleasant, lucid thought and speech. Extremities: Diffuse varicosities noninflamed.  Diffuse hyperpigmentation changes anteriorly to lower legs.  No erythema.  No skin breakdown.  She has just a trace of pitting edema  bilaterally. Right calf measured 10 cm below the inferior border of the patella is 45 cm.  Left calf 43 cm.  LABS:  Last CBC Lab Results   Component Value Date   WBC 4.2 03/23/2021   HGB 12.5 03/23/2021   HCT 38.3 03/23/2021   MCV 95.8 03/23/2021   MCH 31.3 03/23/2021   RDW 13.9 03/23/2021   PLT 131 (L) 03/23/2021   Lab Results  Component Value Date   IRON 44 10/09/2016   TIBC 379 10/09/2016   FERRITIN 32 75/64/3329   Last metabolic panel Lab Results  Component Value Date   GLUCOSE 100 (H) 04/25/2021   NA 141 04/25/2021   K 4.0 04/25/2021   CL 103 04/25/2021   CO2 31 04/25/2021   BUN 20 04/25/2021   CREATININE 1.01 04/25/2021   GFRNONAA 57 (L) 03/10/2021   CALCIUM 9.3 04/25/2021   PHOS 4.7 (H) 05/21/2019   PROT 6.5 03/10/2021   ALBUMIN 4.2 03/10/2021   LABGLOB 3.2 07/23/2014   AGRATIO 0.8 07/23/2014   BILITOT 0.9 03/10/2021   ALKPHOS 106 03/10/2021   AST 26 03/10/2021   ALT 19 03/10/2021   ANIONGAP 8 03/10/2021   Last lipids Lab Results  Component Value Date   CHOL 176 02/23/2020   HDL 71.00 02/23/2020   LDLCALC 74 02/23/2020   LDLDIRECT 106.0 05/17/2018   TRIG 158.0 (H) 02/23/2020   CHOLHDL 2 02/23/2020   Last hemoglobin A1c Lab Results  Component Value Date   HGBA1C 5.5 02/23/2020   Last thyroid functions Lab Results  Component Value Date   TSH 3.56 04/25/2021   Last vitamin D Lab Results  Component Value Date   VD25OH 51.37 04/25/2021   Last vitamin B12 and Folate Lab Results  Component Value Date   JJOACZYS06 301 09/30/2019   FOLATE >23.7 05/10/2015   IMPRESSION AND PLAN:  #1 asymmetric bilateral lower extremity lymphedema. This is very well controlled now on sequential compression pumps via her vein specialist. Continue Lasix 40 mg twice daily. Electrolytes and creatinine today.  #2 chronic renal insufficiency stage III. Electrolytes and creatinine today.  #3 history of iron deficiency anemia. She has been on 325 mg ferrous sulfate twice a day. Most recent hemoglobin was 12.53 months ago. Recheck CBC and check iron panel today.  Hopefully we can get her off  iron.  #4 chronic pain syndrome: Bilateral hip pain for the most part. Remaining functional and having no adverse side effects from taking Vicodin 5/325, 1 every 6 hours. New prescription was not needed today.  #5 hypothyroidism, taking 112 mcg T4 daily.  Patient is on amiodarone so I am monitoring TSH closely TSH today.  #6 chronic generalized weakness/debilitated patient. Stable.  She can get up out of her chair and walk short distances without assistance but needs very close supervision. She was getting home health PT but insurance will not cover this any longer.  She says it would be too much of a burden to try to get out to a physical therapy location.  #7 chronic anticoagulation for atrial fibrillation. CBC today.  An After Visit Summary was printed and given to the patient.  FOLLOW UP: Return in about 3 months (around 09/27/2021) for routine chronic illness f/u.  Signed:  Crissie Sickles, MD           06/27/2021

## 2021-06-28 LAB — CBC
HCT: 40.3 % (ref 36.0–46.0)
Hemoglobin: 13.5 g/dL (ref 12.0–15.0)
MCHC: 33.6 g/dL (ref 30.0–36.0)
MCV: 90.7 fl (ref 78.0–100.0)
Platelets: 123 10*3/uL — ABNORMAL LOW (ref 150.0–400.0)
RBC: 4.45 Mil/uL (ref 3.87–5.11)
RDW: 12.3 % (ref 11.5–15.5)
WBC: 3.1 10*3/uL — ABNORMAL LOW (ref 4.0–10.5)

## 2021-06-28 LAB — BASIC METABOLIC PANEL
BUN: 30 mg/dL — ABNORMAL HIGH (ref 6–23)
CO2: 32 mEq/L (ref 19–32)
Calcium: 9.5 mg/dL (ref 8.4–10.5)
Chloride: 99 mEq/L (ref 96–112)
Creatinine, Ser: 1.34 mg/dL — ABNORMAL HIGH (ref 0.40–1.20)
GFR: 35.8 mL/min — ABNORMAL LOW (ref >60.00)
Glucose, Bld: 101 mg/dL — ABNORMAL HIGH (ref 70–99)
Potassium: 4.6 mEq/L (ref 3.5–5.1)
Sodium: 141 mEq/L (ref 135–145)

## 2021-06-28 LAB — IRON,TIBC AND FERRITIN PANEL
%SAT: 28 % (calc) (ref 16–45)
Ferritin: 128 ng/mL (ref 16–288)
Iron: 93 ug/dL (ref 45–160)
TIBC: 337 mcg/dL (calc) (ref 250–450)

## 2021-06-28 LAB — TSH: TSH: 8.45 u[IU]/mL — ABNORMAL HIGH (ref 0.35–5.50)

## 2021-06-30 ENCOUNTER — Telehealth: Payer: Self-pay

## 2021-06-30 DIAGNOSIS — R7989 Other specified abnormal findings of blood chemistry: Secondary | ICD-10-CM

## 2021-06-30 NOTE — Telephone Encounter (Signed)
-----   Message from Tammi Sou, MD sent at 06/28/2021  2:54 PM EDT ----- Thyroid is borderline low but I don't recommend any change in dose at this time since she feels good.   Kidney function has dropped some.  Since her swelling is so much better with use of her compression pumps I want to decrease her lasix to 20 mg twice a day.  This should help her kidney function improve. Iron levels normal.  Ok to stop iron. Remainder of labs stable. Plan recheck non-fasting bmet in 7-10d, dx elevated serum creatinine.-thx

## 2021-07-02 ENCOUNTER — Other Ambulatory Visit (HOSPITAL_COMMUNITY): Payer: Self-pay | Admitting: Nurse Practitioner

## 2021-07-11 ENCOUNTER — Ambulatory Visit (INDEPENDENT_AMBULATORY_CARE_PROVIDER_SITE_OTHER): Payer: PPO

## 2021-07-11 ENCOUNTER — Other Ambulatory Visit: Payer: Self-pay

## 2021-07-11 DIAGNOSIS — R7989 Other specified abnormal findings of blood chemistry: Secondary | ICD-10-CM

## 2021-07-11 LAB — BASIC METABOLIC PANEL
BUN: 21 mg/dL (ref 6–23)
CO2: 33 mEq/L — ABNORMAL HIGH (ref 19–32)
Calcium: 9.1 mg/dL (ref 8.4–10.5)
Chloride: 101 mEq/L (ref 96–112)
Creatinine, Ser: 1.23 mg/dL — ABNORMAL HIGH (ref 0.40–1.20)
GFR: 39.66 mL/min — ABNORMAL LOW (ref >60.00)
Glucose, Bld: 107 mg/dL — ABNORMAL HIGH (ref 70–99)
Potassium: 4 mEq/L (ref 3.5–5.1)
Sodium: 140 mEq/L (ref 135–145)

## 2021-07-14 ENCOUNTER — Ambulatory Visit (HOSPITAL_COMMUNITY)
Admission: RE | Admit: 2021-07-14 | Discharge: 2021-07-14 | Disposition: A | Discharge status: Home / Self Care | Payer: PPO | Source: Ambulatory Visit | Attending: Nurse Practitioner | Admitting: Nurse Practitioner

## 2021-07-14 ENCOUNTER — Encounter (HOSPITAL_COMMUNITY): Payer: Self-pay | Admitting: Nurse Practitioner

## 2021-07-14 VITALS — BP 162/64 | HR 54 | Ht 66.0 in | Wt 189.0 lb

## 2021-07-14 DIAGNOSIS — D6869 Other thrombophilia: Secondary | ICD-10-CM | POA: Diagnosis not present

## 2021-07-14 DIAGNOSIS — I110 Hypertensive heart disease with heart failure: Secondary | ICD-10-CM | POA: Diagnosis not present

## 2021-07-14 DIAGNOSIS — I5032 Chronic diastolic (congestive) heart failure: Secondary | ICD-10-CM | POA: Diagnosis not present

## 2021-07-14 DIAGNOSIS — I480 Paroxysmal atrial fibrillation: Secondary | ICD-10-CM | POA: Insufficient documentation

## 2021-07-14 DIAGNOSIS — Z79899 Other long term (current) drug therapy: Secondary | ICD-10-CM | POA: Insufficient documentation

## 2021-07-14 DIAGNOSIS — C8510 Unspecified B-cell lymphoma, unspecified site: Secondary | ICD-10-CM | POA: Insufficient documentation

## 2021-07-14 MED ORDER — FUROSEMIDE 40 MG PO TABS: ORAL_TABLET | ORAL | Status: DC

## 2021-07-14 NOTE — Progress Notes (Signed)
Patient ID: Amanda Sellers, female   DOB: 02-Sep-1932, 88 y.o.   MRN: [id number]           Primary Care Physician: Tammi Sou, MD Referring Physician: Dr. Ronni Rumble Amanda Sellers is a 88 y.o. female with a h/o  diastolic heart failure, afib, newly diagnosed with recent dx of lymphoma. She was admitted to [facility] 9/22 on IV dilt and given diuretics for acute on chronic systolic HF.  It was arranged for to undergo TEE DCCV.   She had exertional dyspnea with PND & orthopnea. She felt very tired & had little energy.  She  noted a few palpitations, but does not recall rapid irregular heart rates/rhythms.    She had TEE without thrombus and underwent DCCV that was successful with one shock of 120J. She did well post procedure and was seen by Dr. Jerilynn Mages. Croitoru and found stable for discharge.   She was negative 2700cc since admit and weight was down 4 lbs with IV diuretics.  D/C wt is 168.  Plan on d/c was for  uninterrupted anticoagulation for next 30 days, preferably lifelong barring bleeding complications.  AFib clinic follow up in 1-2 weeks. [facility], non-urgent.  ASA stopped.   She was seen in the afib clinic, 10/10 and found to be in afib with rvr. She went into afib short time after cardioversion. EKG showed afib at 130-140 bpm. She was  not on any drugs for rate control.  Is taking DOAC without fail. Continues with chemotherapy for lymphoma.   She was started on BB and  returned to clinic 10/12 with better v rates. 80's with sitting and 90-100's with activity. She does not have any PND/orthopnea although she has slept better in her recliner for months now.No lower extremity edema. Her weight is stable at home.  She has  finished chemotherapy for non- hodgkin's lymphoma and had f/u Pet scan 10/26,  and CA appears to be in remission, so per pt, no more chemo for right now. She has been on DOAC now x one month so will start attempts to restore SR. Discussed options  amiodarone or tikosyn and she would prefer amiodarone as she has two neighbors on amiodarone and they are both in rhythm and tolerating drug well. She is not interested in a hospital stay for drug loading. She reports rash with recent contrast dye. Amiodarone was started 200 mg bid, to then go down to 200 mg a day and then would discuss cardioversion.  She asked to be seen 11/2,  in the afib clinic for increased shortness of breath yesterday and last night. She has slept in the recliner for months but did not feel as well last night and slept poorly, feeling more short of breath and fullness in her chest.She has noticed increased pedal edema. She did not take her amiodarone last night or this am. Ekg shows rate controlled afib at 82 bpm, qtc at 464 ms.PO on RA is 98%. Weight is up 2 lbs from last visit, 6 lbs since 10/17. Lasix dose was decreased to 40 mg daily from 40 mg am and 20 mg pm when seen by Dr. Ellyn Hack 10/22.. Pt has noticed weight climbing for several days. She is fairly comfortable when quiet and sitting.   She was asked to double lasix to 80 mg  x 3 days, and to reduce amiodarone 200 mg a day. She had lost 3 pounds on return to clinic several days later. Clinically, she  is much improved with more energy and less shortness of breath. Able to sleep well. Had enough energy to walk into clinic today without wheelchair and felt well enough to clean her house yesterday. Ideally,I would like to load amiodarone a while longer but will go ahead and  pursue DCCV sooner than later, as it is contributing to her heart failure. Compliant with blood thinner without missed doses On recent labs, TSH was elevated but daughter explained that she just started taking replacement correctly on an empty stomach, and sees PCP 11/16 and she expects him to adjust the dose.  11/14-  Returns to afib clinic with a smile on her face and reporting all the activities she has been doing over the last week, having returned to  sinus rhythm after cardioversion. Much more energy and breathing is much improved.  Fluid status is stable.  12/13- Returns to afib clinic for f/u and reports doing well No afib, no further shortness of breath or edema. She is back to her usual activities. Got a good report form the Ca unit recently. Tolerating amiodarone 200 mg a day.  Return to afib clinic 4/18, reports that she is staying in [place]. Saw pulmonology yesterday for some c/o of shortness of breath but breathing much improved since staying in SR. Xray no active process, thyroid, liver studies drawn yesterday, and normal. MD said he would call hr after results of tests were reviewed. Fluid status normal.  F/u in afib clinic 01/31/16, pt continues to do well without any afib. Her fluid status is normal. Last TSH, 3 months ago, showed mild increase and will check TSH and liver panel today. Her breathing is good other than the last week in the bitter cold weather  she does not breath as good outside.No bleeding issues with eliquis. Fluid status is stable on lasix.  F/u in afib 01/26/17  for shortness of breath. Pt has been having shortness of breath for some while but contributed it to her knees and difficultly in ambulating. She was seen by pulmonology in July with improved PFT's and was OK to continue amiodarone. She was seen in November by Dr. Ellyn Hack and shortness of breath was thought to be stable/chronic. She feels that her shortness of breath has worsened since Christmas.  If she walks to the bathroom and back she is entirely out of breath. Sleeps on 2 pillows and has not felt the need to increase to elevate her head more at night. Ekg today shows junctional rhythm at 46 bpm. She is breathless speaking and started walking to clinic room but had to stop and get w/c. PO 99%.  F/u in afib clinic 1/7, after reducing amiodarone to 1/2 tab a day as well BB to 1/2 tab a day for brady as well as increase in shortness of breath. She is feeling much better  today to the point she was able to walk to clinic and not use w/c. She is not breathless talking.  F/u in afib clinic, 1/23, She feels great! Saw her pulmonologist recently and she said he was very pleased with her lung status. She is walking with a walker and observed coming into the clinic, no exertional dyspnea noted.  Her  HR is Sinus in the 40's today, but she is not symptomatic. She checks her HR with a pulse ox several times a day and it usually runs around 53-60.   F/u in afib clinic, 08/15/17. She is feeling well. Just saw Dr. Lenna Gilford and was given a  good report. Ekg, SR with HR's in the 50's. 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 She has some chromic LE, which was worse after recent knee injections.. She did increase lasix for a couple of days and now back to baseline now.  F/u in afib clinic, 1/8, she is doing very well. She is in SR and has not noted any afib. 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 No LLE. She walks with a walker, no falls. No shortness of breath. On amiodarone 100 mg qd. Notices a little numbness in her distal fingertips intermittently.No issues with anticoagulation.  F/u in 07/31/18. Continues on amiodarone for afib management. She is in [place] today and has not noted any irregular heart rhythm. Her main complaint is that she has noted more LLE and has been back to sleeping in her recliner. She continues on lasix 40 mg bid. Avoids salt and salty foods and sits with her feet up during the day. No change in her health other than increase in  edema. She does drink 8 + glasses of water a day and may need to back off that some.weight is up 7 lbs when compared to last in person office visit in March.  F/u 03/11/19. She states that she feels great. She is in SR, no appreciated afib. Continues on amiodarone.  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 No pedal edema or shortness of breath. No issues with eliquis with a CHA2DS2VASc score of 5.  F/u in afib clinic,  09/16/19. She remains on amiodarone 100 mg daily and is staying in SR. She was hospitalized for fluid retention/shortness of breath in April 2021. Had a LHC, had some CAD but thought to be best treated medically. She was placed on entresto and amlodipine. She has done well since then. Denies any dyspnea  today. She continues to sit with the elderly, but now has a break, as the woman she was staying with fell and broke her hip and is still in the hospital. She is currently in the donut hole with entresto and eliquis, being her most expensive drugs.   F/u in afib clinic, 04/30/20. She remains in SR with  On 200 mg amiodarone daily.  It was increased  with she had afib with RVR/HF hospitalization last spring. Labs checked in January with normal liver enzymes and normal TSH. She has yearly PFT's with her pulmonologist. She has some chronic shortness of breath, at baseline. She is still sitting with a lady in her 101's for 4 days a week.   F/u in afib clinic, 03/10/21. Unfortunately, since I saw pt last, she had a fall, no fractures but was sent to a rehab facility. Pt states  "They almost killed me". Her lasix was held for the most part that she was there and she  developed weeping  legs, rt worse than left and was seen on d/c by PCP with dx of rt lower leg lymphedema, recurrent superficial ulcerations, in the setting of chronic renal insufficieny. Pt states that her leg is much improved but still has some ulcerations on top of her rt foot. She is being followed by [facility]  and has f/u with them again on Monday. EKG shows sinus brady at 51 bpm, with bigeminy PAC's with LBBB. Will reduce amiodarone to 100 mg daily.  F/u in afib clinic, 04/13/21. She was suppose to have TSH rechecked today as she was instructed to reduce amiodarone  to 100 mg on last visit but she forgot and has been taking 1/2 tab bid ( 200 mg) since that time. Her PCP has adjusted her Levothyroxine as well as she has a f/u with him in the next few  weeks. Otherwise, she is feeling stronger and her rt leg is healing well. She is in SR.   F/u with afib clinic, 07/14/21, for amiodarone surveillance. Pt reports that  the swelling in her legs is so much better with compression pumps that she has been able to reduce lasix. She is feeling stronger. She is on amiodarone 100 mg daily now (from 200 mg daily)  and her TSH is trending down. She is active around the house. BP at home well controlled.  Today, she denies symptoms of palpitations, chest pain,  orthopnea, PND, mild baseline chronic lower extremity edema, no dizziness, presyncope, syncope, or neurologic sequela.  The patient is tolerating medications without difficulties and is otherwise without complaint today.   Past Medical History:  Diagnosis Date   Anemia of chronic disease    Anticoagulation adequate, Eliquis 10/16/2014   Anxiety    clonaz helps this AND her breathing   Cholelithiasis without cholecystitis 07/2020   noted on CT abd/pelv at [facility]   Chronic combined systolic and diastolic congestive heart failure ([place]) 89/16/9450   Complicated by atrial fibrillation; Echo 07/23/14: mild LVH, EF 50-55%, Gr 2 DD.;  Echo May 15, 2019: EF 35 to 40%, GRII DD.  (In setting of CHF exacerbation, class III)-> baseline class II symptoms. 09/2019 EF 55-60%, nl wall motion and LV fxn, grd II DD, mod MR.   Chronic renal insufficiency, stage III (moderate) (HCC)    GFR @ 50   COPD (chronic obstructive pulmonary disease) ([place])    recently noted on Xray, does not have any problems   Diverticulosis    Follicular lymphoma ([place])    Non Hodgkins B cell lymphoma; s/p chemo summer 2016--in remission still as of 09/2018: continue obs q 63mo   Hiatal hernia 07/2020   moderate size-noted on CT abd/pelv at [facility] (hyperlipidemia)     06/2014   HTN (hypertension)    Hypothyroidism    IFG (impaired fasting glucose)    Lip cancer    Dr. Janace Hoard excised this: invasive SCC--no  sign of cancer at ENT f/u 10/2015   Lymphedema    R>>L   Microcytic anemia    transfused 3 U total in hosp 07/2014   Obesity (BMI 30-39.9)    Pancytopenia due to antineoplastic chemotherapy ([place])    Persistent atrial fibrillation ([place])    With RVR; elec cardioversion 11/30/14.  On Amio since 10/2014; Dr. Lenna Gilford following her from pulm standpoint regarding this med.   Positive occult stool blood test 08/08/2014   Endoscopies ok 08/2014   Primary osteoarthritis of both knees 04/2016   Dr. Gladstone Lighter: bone on bone--to get euflexxa to both knees.   Protein calorie malnutrition ([place]) 2022   Pulmonary hypertension due to left heart disease ([place])    RLCP 05/19/2019: EF 35-40%. D1 50%, RI 70%, dLM-LAD 30%.  mPAP 43 mmHg - (62/19 mmHg), PCWP  28 mmHg (V wave 41 mmHg) LVEDP 34 mmHg   Pulmonary metastases 07/24/2014   S/p left hip fracture 05/2020   ORIF   Venous stasis ulcer (HCC)    Right leg   Vitamin B 12 deficiency    Vitamin D deficiency    dose increased to 50 K U tab TWICE  weekly 04/2017   Past Surgical History:  Procedure Laterality Date   ABDOMINAL HYSTERECTOMY  1978   BONE MARROW BIOPSY  07/24/2014   BREAST BIOPSY     CARDIOVERSION N/A 10/16/2014   Procedure: CARDIOVERSION;  Surgeon: Lelon Perla, MD;  Location: [facility] ENDOSCOPY;  Service: Cardiovascular;  Laterality: N/A;   CARDIOVERSION N/A 11/30/2014   Procedure: CARDIOVERSION;  Surgeon: Larey Dresser, MD;  Location: [place];  Service: Cardiovascular;  Laterality: N/A;   CATARACT EXTRACTION  2020   OU: Dr. Katy Fitch   COLONOSCOPY  09/23/2014   small hemorrhoids, otherwise normal (performed for IDA and heme+ stool)   COLONOSCOPY     [place]     May 2022   INTRAMEDULLARY (IM) NAIL INTERTROCHANTERIC Left 05/25/2020   Procedure: INTRAMEDULLARY (IM) NAIL INTERTROCHANTRIC;  Surgeon: Erle Crocker, MD;  Location: [place];  Service: Orthopedics;  Laterality: Left;   MASS EXCISION Left 07/15/2015    Left lower lip mass--invasive SCC w/negative margins.  Procedure: EXCISION MASS;  Surgeon: Melissa Montane, MD;  Location: [place];  Service: ENT;  Laterality: Left;  Wedge excision left lower lip mass   NM MYOVIEW LTD  10/29/2014   medium size mild surgery defect in the mid anterior and apical anterior location suggestive of breast attenuation. No reversibility. LOW RISK   PFTs  02/2015   Restriction with diffusion defect: cardiologist referred her to pulm to help interpret PFTs and decide whether she has amiodarone toxicity   RIGHT/LEFT HEART CATH AND CORONARY ANGIOGRAPHY N/A 05/19/2019   Procedure: RIGHT/LEFT HEART CATH AND CORONARY ANGIOGRAPHY;  Surgeon: Troy Sine, MD;  Location: [facility];; EF 35-40%. D1 50%, RI 70%, dLM-LAD 30%.  mPAP 43 mmHg - (62/19 mmHg), PCWP  28 mmHg (V wave 41 mmHg) LVEDP 34 mmHg   TEE WITHOUT CARDIOVERSION N/A 10/16/2014   Procedure: TRANSESOPHAGEAL ECHOCARDIOGRAM (TEE);  Surgeon: Lelon Perla, MD;  Location: [facility] ENDOSCOPY;  Service: Cardiovascular;  Laterality: N/A;   TRANSTHORACIC ECHOCARDIOGRAM  10/09/2019   EF 55 to 60%.  GRII DD.  No R WMA.  Normal PAP and RAP.  Severe LA dilation.  Mild RA dilation.  Moderate MR (likely related to annular dilation in the setting of severe LAE.  Mild aortic valve sclerosis-no stenosis.   TRANSTHORACIC ECHOCARDIOGRAM  05/15/2019   Acute CHF exacerbation:  EF 35-40%.  Moderately reduced function.  (Previous EF was 55 to 60%) global HK.  GRII DD.  Mild LA dilation.  Normal RV size.   UPPER GI ENDOSCOPY  09/23/2014   small hiatus hernia, nodules in stomach biopsied (chronic active erosive atrophic gastritis with intestinal metaplasia--no dysplasia or malignancy) otherwise normal    Current Outpatient Medications  Medication Sig Dispense Refill   amiodarone (PACERONE) 200 MG tablet Take 0.5 tablets (100 mg total) by mouth daily.     apixaban (ELIQUIS) 5 MG TABS tablet Take 1 tablet (5 mg total) by mouth 2 (two) times  daily. 42 tablet 0   cyanocobalamin (,VITAMIN B-12,) 1000 MCG/ML injection INJECT 1 ML INTO THE SKIN EVERY 30 DAYS. 3 mL 3   diclofenac Sodium (VOLTAREN) 1 % GEL Apply 2 g topically 4 (four) times daily. Apply to LE joints that are in pain. 300 g 1   ENTRESTO 24-26 MG TAKE 1 TABLET BY MOUTH TWICE A DAY 60 tablet 11   ferrous sulfate 325 (65 FE) MG tablet Take 1 tablet (325 mg total) by mouth 2 (two)  times daily with a meal. 30 tablet 1   fish oil-omega-3 fatty acids 1000 MG capsule Take 2 g by mouth daily.     fluticasone (CUTIVATE) 0.05 % cream Apply to affected area of lower legs bid prn 60 g 2   HYDROcodone-acetaminophen (NORCO/VICODIN) 5-325 MG tablet 1-2 tabs po tid prn pain 180 tablet 0   hydrOXYzine (VISTARIL) 25 MG capsule TAKE 1 TO 2 CAPSULES BY MOUTH AT BEDTIME FOR SLEEP 180 capsule 1   levothyroxine (SYNTHROID) 112 MCG tablet Take 1 tablet (112 mcg total) by mouth daily. 90 tablet 1   OXYGEN Inhale into the lungs. 1Lt at bedtime  as needed     Potassium Chloride ER 20 MEQ TBCR TAKE 1 TABLET BY MOUTH EVERY DAY 90 tablet 0   rosuvastatin (CRESTOR) 20 MG tablet TAKE 1 TABLET BY MOUTH EVERY DAY 90 tablet 3   Vitamin D, Ergocalciferol, (DRISDOL) 1.25 MG (50000 UNIT) CAPS capsule Take 1 capsule (50,000 Units total) by mouth 2 (two) times a week. 24 capsule 3   furosemide (LASIX) 40 MG tablet Take 1/2 tablet by mouth twice daily.     No current facility-administered medications for this encounter.    Allergies  Allergen Reactions   Aspirin Itching   Calcium Alginate Other (See Comments)    Excessive burning   Morphine     Other reaction(s): Hallucinations   Iodinated Contrast Media Rash and Other (See Comments)    CT Contrast    Social History   Socioeconomic History   Marital status: Widowed    Spouse name: Not on file   Number of children: 8   Years of education: Not on file   Highest education level: Not on file  Occupational History   Occupation: retired  Tobacco Use    Smoking status: Never   Smokeless tobacco: Never  Vaping Use   Vaping Use: Never used  Substance and Sexual Activity   Alcohol use: No    Alcohol/week: 0.0 standard drinks of alcohol   Drug use: No   Sexual activity: Not on file  Other Topics Concern   Not on file  Social History Narrative   Widowed, has 8 children.   Orig from [place], now lives in [place].   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   No tob/alc/drugs.   Tries to walk, but her back starts to hurt.  Has to use a walker.   Social Determinants of Health   Financial Resource Strain: Low Risk  (01/05/2021)   Overall Financial Resource Strain (CARDIA)    Difficulty of Paying Living Expenses: Not hard at all  Food Insecurity: No Food Insecurity (01/05/2021)   Hunger Vital Sign    Worried About Running Out of Food in the Last Year: Never true    Ran Out of Food in the Last Year: Never true  Transportation Needs: No Transportation Needs (01/05/2021)   PRAPARE - Hydrologist (Medical): No    Lack of Transportation (Non-Medical): No  Physical Activity: Insufficiently Active (01/05/2021)   Exercise Vital Sign    Days of Exercise per Week: 5 days    Minutes of Exercise per Session: 10 min  Stress: No Stress Concern Present (01/05/2021)   [place]    Feeling of Stress : Not at all  Social Connections: Moderately Isolated (01/05/2021)   Social Connection and Isolation Panel [NHANES]  Frequency of Communication with Friends and Family: More than three times a week    Frequency of Social Gatherings with Friends and Family: Once a week    Attends Religious Services: More than 4 times per year    Active Member of [employer] or Organizations: No    Attends Archivist Meetings: Never    Marital Status: Widowed  Intimate Partner Violence: Not At Risk (01/05/2021)   Humiliation,  Afraid, Rape, and Kick questionnaire    Fear of Current or Ex-Partner: No    Emotionally Abused: No    Physically Abused: No    Sexually Abused: No    Family History  Problem Relation Age of Onset   Melanoma Mother    Stomach cancer Father    Lymphoma Sister    Diabetes Daughter     ROS- All systems are reviewed and negative except as per the HPI above  Physical Exam: Vitals:   07/14/21 1037  BP: (!) 162/64  Pulse: (!) 54  Weight: 85.7 kg  Height: $Remove'5\' 6"'GorCFqS$  (1.676 m)    GEN- The patient is well appearing, alert and oriented x 3 today.   Head- normocephalic, atraumatic Eyes-  Sclera clear, conjunctiva pink Ears- hearing intact Oropharynx- clear Neck- supple, no JVP Lymph- no cervical lymphadenopathy Lungs- Clear to ausculation bilaterally, normal work of breathing Heart-  regular rate and rhythm, no murmurs, rubs or gallops, PMI not laterally displaced GI- soft, NT, ND, + BS Extremities- no clubbing, cyanosis, 2+  Edema to knee area MS- no significant deformity or atrophy Skin- no rash or lesion Psych- euthymic mood, full affect Neuro- strength and sensation are intact  EKG -Vent. rate 57 BPM PR interval 260 ms QRS duration 146 ms QT/QTcB 500/486 ms P-R-T axes 63 55 144 Sinus bradycardia with 1st degree A-V block Left bundle branch block Abnormal ECG When compared with ECG of 10-Mar-2021 10:16, PREVIOUS ECG IS PRESENT   Assessment and Plan: 1 Afib Maintaining SR on 100 mg amiodarone now since last visit  TSH trending down  Off BB for some time now  for bradycardia Continue apixaban for chadsvasc score of at least 4  Continue yearly PFT's  thru  pulmonology   2. Diastolic heart failure LLE edema much improved with compression pump Weight  stable  Continue  lasix  40 mg 1/2 tab bid ( recently reduced with improved LEE) Continue  entresto   Avoid salt   3. HTN Stable   4. Non Hodgkns B cell lymphoma S/p chemotherapy Per oncology     F/u in 6  months    Butch Penny C. Shereta Crothers, [facility] [address] [place], [place] [address] [phone]

## 2021-07-25 DIAGNOSIS — R609 Edema, unspecified: Secondary | ICD-10-CM | POA: Diagnosis not present

## 2021-07-25 DIAGNOSIS — I509 Heart failure, unspecified: Secondary | ICD-10-CM | POA: Diagnosis not present

## 2021-07-28 ENCOUNTER — Other Ambulatory Visit (HOSPITAL_COMMUNITY): Payer: Self-pay | Admitting: Nurse Practitioner

## 2021-08-01 ENCOUNTER — Other Ambulatory Visit: Payer: Self-pay | Admitting: Family Medicine

## 2021-08-02 NOTE — Telephone Encounter (Signed)
Amanda Sellers's daughter called to make sure we rec'd request for Hydrocodone request last night thru mychart.  I told her yes, but Dr. Anitra Lauth has not approved yet.  Copied and pasted her message below: Refills have been requested for the following medications:       HYDROcodone-acetaminophen (NORCO/VICODIN) 5-325 MG tablet [[name]]   Preferred pharmacy: [id number]- [place], [place] - [address] Delivery method: [employer]

## 2021-08-02 NOTE — Telephone Encounter (Signed)
Requesting: [facility] Contract: n/a UDS: n/a Last Visit:  Next Visit: 09/28/21 Last Refill: 06/16/21(180,0)  Please Advise. Med pending

## 2021-08-03 MED ORDER — HYDROCODONE-ACETAMINOPHEN 5-325 MG PO TABS: ORAL_TABLET | ORAL | 0 refills | Status: DC

## 2021-08-03 NOTE — Telephone Encounter (Signed)
Pt's daughter advised refill sent.

## 2021-08-18 ENCOUNTER — Telehealth: Payer: Self-pay

## 2021-08-18 NOTE — Telephone Encounter (Signed)
Home health orders received 08/18/21 for [facility] health initiation orders: No.  Home health re-certification orders: Yes. Patient last seen by ordering physician for this condition: 06/27/21. Must be less than 90 days for re-certification and less than 30 days prior for initiation. Visit must have been for the condition the orders are being placed.  Patient meets criteria for Physician to sign orders: Yes.        Current med list has been attached: Yes        Orders placed on physicians desk for signature: 08/18/21 (date) If patient does not meet criteria for orders to be signed: pt was called to schedule appt. Appt is scheduled for n/a.   [place]

## 2021-08-25 DIAGNOSIS — R609 Edema, unspecified: Secondary | ICD-10-CM | POA: Diagnosis not present

## 2021-08-25 DIAGNOSIS — I509 Heart failure, unspecified: Secondary | ICD-10-CM | POA: Diagnosis not present

## 2021-09-04 ENCOUNTER — Other Ambulatory Visit: Payer: Self-pay | Admitting: Cardiology

## 2021-09-05 ENCOUNTER — Other Ambulatory Visit (HOSPITAL_COMMUNITY): Payer: Self-pay

## 2021-09-05 MED ORDER — AMIODARONE HCL 200 MG PO TABS: 100.0000 mg | ORAL_TABLET | Freq: Every day | ORAL | 6 refills | Status: DC

## 2021-09-08 NOTE — Telephone Encounter (Signed)
Amanda Sellers - can you sign off on encounter.  I am not able to.

## 2021-09-14 ENCOUNTER — Other Ambulatory Visit: Payer: Self-pay | Admitting: Family Medicine

## 2021-09-15 MED ORDER — HYDROCODONE-ACETAMINOPHEN 5-325 MG PO TABS: ORAL_TABLET | ORAL | 0 refills | Status: DC

## 2021-09-15 NOTE — Telephone Encounter (Signed)
Requesting: [facility]co Contract: n/a for this med UDS: N/A Last Visit: 06/27/21 Next Visit: 3 month f/u advised Last Refill: 08/03/21 (180,0)  Please Advise. Medication pending

## 2021-09-15 NOTE — Telephone Encounter (Signed)
Pt advised refill sent via MyChart.

## 2021-09-25 DIAGNOSIS — R609 Edema, unspecified: Secondary | ICD-10-CM | POA: Diagnosis not present

## 2021-09-25 DIAGNOSIS — I509 Heart failure, unspecified: Secondary | ICD-10-CM | POA: Diagnosis not present

## 2021-09-28 ENCOUNTER — Telehealth (INDEPENDENT_AMBULATORY_CARE_PROVIDER_SITE_OTHER): Payer: PPO | Admitting: Family Medicine

## 2021-09-28 ENCOUNTER — Encounter: Payer: Self-pay | Admitting: Family Medicine

## 2021-09-28 VITALS — BP 128/68 | HR 55

## 2021-09-28 DIAGNOSIS — G8929 Other chronic pain: Secondary | ICD-10-CM | POA: Diagnosis not present

## 2021-09-28 DIAGNOSIS — M545 Low back pain, unspecified: Secondary | ICD-10-CM | POA: Diagnosis not present

## 2021-09-28 DIAGNOSIS — R600 Localized edema: Secondary | ICD-10-CM | POA: Diagnosis not present

## 2021-09-28 DIAGNOSIS — G894 Chronic pain syndrome: Secondary | ICD-10-CM | POA: Diagnosis not present

## 2021-09-28 DIAGNOSIS — E039 Hypothyroidism, unspecified: Secondary | ICD-10-CM | POA: Diagnosis not present

## 2021-09-28 DIAGNOSIS — N1832 Chronic kidney disease, stage 3b: Secondary | ICD-10-CM

## 2021-09-28 DIAGNOSIS — Z79899 Other long term (current) drug therapy: Secondary | ICD-10-CM

## 2021-09-28 MED ORDER — FLUTICASONE PROPIONATE 0.05 % EX CREA: TOPICAL_CREAM | CUTANEOUS | 5 refills | Status: DC

## 2021-09-28 MED ORDER — HYDROCODONE-ACETAMINOPHEN 5-325 MG PO TABS: ORAL_TABLET | ORAL | 0 refills | Status: DC

## 2021-09-28 MED ORDER — POTASSIUM CHLORIDE ER 20 MEQ PO TBCR: 1.0000 | EXTENDED_RELEASE_TABLET | Freq: Every day | ORAL | 3 refills | Status: DC

## 2021-09-28 MED ORDER — DICLOFENAC SODIUM 1 % EX GEL
2.0000 g | Freq: Four times a day (QID) | CUTANEOUS | 1 refills | Status: DC
Start: 2021-09-28 — End: 2021-12-27

## 2021-09-28 NOTE — Progress Notes (Signed)
Virtual Visit via Video Note  I connected with Amanda Sellers on 09/28/21 at 10:40 AM EDT by a video enabled telemedicine application and verified that I am speaking with the correct person using two identifiers.  Location patient: Amanda Sellers Location provider:work or home office Persons participating in the virtual visit: patient, provider  I discussed the limitations and requested verbal permission for telemedicine visit. The patient expressed understanding and agreed to proceed.  CC: Patient is a 88 y.o. female being seen today accompanied by her daughter Amanda Sellers for 3 mo f/u chronic lower extremity edema, chronic pain syndrome, chronic renal insufficiency. A/P as of last visit: "#1 asymmetric bilateral lower extremity lymphedema. This is very well controlled now on sequential compression pumps via her vein specialist. Continue Lasix 40 mg twice daily. Electrolytes and creatinine today.   #2 chronic renal insufficiency stage III. Electrolytes and creatinine today.   #3 history of iron deficiency anemia. She has been on 325 mg ferrous sulfate twice a day. Most recent hemoglobin was 12.53 months ago. Recheck CBC and check iron panel today.  Hopefully we can get her off iron.   #4 chronic pain syndrome: Bilateral hip pain for the most part. Remaining functional and having no adverse side effects from taking Vicodin 5/325, 1 every 6 hours. New prescription was not needed today.   #5 hypothyroidism, taking 112 mcg T4 daily.  Patient is on amiodarone so I am monitoring TSH closely TSH today.   #6 chronic generalized weakness/debilitated patient. Stable.  She can get up out of her chair and walk short distances without assistance but needs very close supervision. She was getting home health PT but insurance will not cover this any longer.  She says it would be too much of a burden to try to get out to a physical therapy location.   #7 chronic anticoagulation for atrial fibrillation. CBC  today."  INTERIM HX: Labs last visit showed a TSH of 8.45 but I did not make a change in her thyroid dosing. Hemoglobin was normal, iron levels normal--we discontinued her iron supplement. We decreased her Lasix to 20 mg twice a day.  She has good days and bad days.  Last couple of nights have been worse.  Has been up every 1-2 hours with pain, unable to get back to sleep very well.  This sounds like a fairly regular occurrence for her.  She takes one of her Vicodin 5-325 mg tabs every 6 hours scheduled.  Lower extremity swelling is stable with use of 20 mg Lasix twice a day and intermittent compression boots.  PMP AWARE reviewed today: most recent rx for Vicodin was filled 09/15/2021, #180, rx by me. No red flags.  ROS: See pertinent positives and negatives per HPI.  Past Medical History:  Diagnosis Date   Anemia of chronic disease    Anticoagulation adequate, Eliquis 10/16/2014   Anxiety    clonaz helps this AND her breathing   Cholelithiasis without cholecystitis 07/2020   noted on CT abd/pelv at [facility]   Chronic combined systolic and diastolic congestive heart failure ([place]) 15/40/0867   Complicated by atrial fibrillation; Echo 07/23/14: mild LVH, EF 50-55%, Gr 2 DD.;  Echo May 15, 2019: EF 35 to 40%, GRII DD.  (In setting of CHF exacerbation, class III)-> baseline class II symptoms. 09/2019 EF 55-60%, nl wall motion and LV fxn, grd II DD, mod MR.   Chronic renal insufficiency, stage III (moderate) (HCC)    GFR @ 50   COPD (chronic obstructive pulmonary  disease) ([place])    recently noted on Xray, does not have any problems   Diverticulosis    Follicular lymphoma ([place])    Non Hodgkins B cell lymphoma; s/p chemo summer 2016--in remission still as of 09/2018: continue obs q 28mo  Hiatal hernia 07/2020   moderate size-[facility]     06/2014   HTN (hypertension)    Hypothyroidism    IFG (impaired fasting glucose)     Lip cancer    Dr. BJanace Hoardexcised this: invasive SCC--no sign of cancer at ENT f/u 10/2015   Lymphedema    R>>L   Microcytic anemia    transfused 3 U total in hosp 07/2014   Obesity (BMI 30-39.9)    Pancytopenia due to antineoplastic chemotherapy ([place]    Persistent atrial fibrillation ([facility]    With RVR; elec cardioversion 11/30/14.  On Amio since 10/2014; Dr. NLenna Gilfordfollowing her from pulm standpoint regarding this med.   Positive occult stool blood test 08/08/2014   Endoscopies ok 08/2014   Primary osteoarthritis of both knees 04/2016   Dr. GGladstone Lighter bone on bone--to get euflexxa to both knees.   Protein calorie malnutrition ([place] 2022   Pulmonary hypertension due to left heart disease ([place]    RLCP 05/19/2019: EF 35-40%. D1 50%, RI 70%, dLM-LAD 30%.  mPAP 43 mmHg - (62/19 mmHg), PCWP  28 mmHg (V wave 41 mmHg) LVEDP 34 mmHg   Pulmonary metastases 07/24/2014   S/p left hip fracture 05/2020   ORIF   Venous stasis ulcer (HCC)    Right leg   Vitamin B 12 deficiency    Vitamin D deficiency    dose increased to 50 K U tab TWICE weekly 04/2017    Past Surgical History:  Procedure Laterality Date   ABDOMINAL HYSTERECTOMY  1978   BONE MARROW BIOPSY  07/24/2014   BREAST BIOPSY     CARDIOVERSION N/A 10/16/2014   Procedure: CARDIOVERSION;  Surgeon: BLelon Perla MD;  [facility]  Service: Cardiovascular;  Laterality: N/A;   CARDIOVERSION N/A 11/30/2014   Procedure: CARDIOVERSION;  Surgeon: DLarey Dresser MD;  [place]  Service: Cardiovascular;  Laterality: N/A;   CATARACT EXTRACTION  2020   OU: Dr. GKaty Fitch  COLONOSCOPY  09/23/2014   small hemorrhoids, otherwise normal (performed for IDA and heme+ stool)   COLONOSCOPY     [facility]    May 2022   INTRAMEDULLARY (IM) NAIL INTERTROCHANTERIC Left 05/25/2020   Procedure: INTRAMEDULLARY (IM) NAIL INTERTROCHANTRIC;  Surgeon: AErle Crocker MD;  [place] [facility]  Service:  Orthopedics;  Laterality: Left;   MASS EXCISION Left 07/15/2015   Left lower lip mass--invasive SCC w/negative margins.  Procedure: EXCISION MASS;  Surgeon: JMelissa Montane MD;  [place]  Service: ENT;  Laterality: Left;  Wedge excision left lower lip mass   NM MYOVIEW LTD  10/29/2014   medium size mild surgery defect in the mid anterior and apical anterior location suggestive of breast attenuation. No reversibility. Sellers RISK   PFTs  02/2015   Restriction with diffusion defect: cardiologist referred her to pulm to help interpret PFTs and decide whether she has amiodarone toxicity   RIGHT/LEFT HEART CATH AND CORONARY ANGIOGRAPHY N/A 05/19/2019   Procedure: RIGHT/LEFT HEART CATH AND CORONARY ANGIOGRAPHY;  Surgeon: KTroy Sine MD;  [facility];; EF 35-40%. D1 50%, RI 70%, dLM-LAD  30%.  mPAP 43 mmHg - (62/19 mmHg), PCWP  28 mmHg (V wave 41 mmHg) LVEDP 34 mmHg   TEE WITHOUT CARDIOVERSION N/A 10/16/2014   Procedure: TRANSESOPHAGEAL ECHOCARDIOGRAM (TEE);  Surgeon: Lelon Perla, MD;  Location: [facility] ENDOSCOPY;  Service: Cardiovascular;  Laterality: N/A;   TRANSTHORACIC ECHOCARDIOGRAM  10/09/2019   EF 55 to 60%.  GRII DD.  No R WMA.  Normal PAP and RAP.  Severe LA dilation.  Mild RA dilation.  Moderate MR (likely related to annular dilation in the setting of severe LAE.  Mild aortic valve sclerosis-no stenosis.   TRANSTHORACIC ECHOCARDIOGRAM  05/15/2019   Acute CHF exacerbation:  EF 35-40%.  Moderately reduced function.  (Previous EF was 55 to 60%) global HK.  GRII DD.  Mild LA dilation.  Normal RV size.   UPPER GI ENDOSCOPY  09/23/2014   small hiatus hernia, nodules in stomach biopsied (chronic active erosive atrophic gastritis with intestinal metaplasia--no dysplasia or malignancy) otherwise normal     Current Outpatient Medications:    amiodarone (PACERONE) 200 MG tablet, Take 0.5 tablets (100 mg total) by mouth daily., Disp: 30 tablet, Rfl: 6   apixaban (ELIQUIS) 5 MG TABS  tablet, Take 1 tablet (5 mg total) by mouth 2 (two) times daily., Disp: 42 tablet, Rfl: 0   cyanocobalamin (,VITAMIN B-12,) 1000 MCG/ML injection, INJECT 1 ML INTO THE SKIN EVERY 30 DAYS., Disp: 3 mL, Rfl: 3   diclofenac Sodium (VOLTAREN) 1 % GEL, Apply 2 g topically 4 (four) times daily. Apply to LE joints that are in pain., Disp: 300 g, Rfl: 1   ENTRESTO 24-26 MG, TAKE 1 TABLET BY MOUTH TWICE A DAY, Disp: 60 tablet, Rfl: 11   ferrous sulfate 325 (65 FE) MG tablet, Take 1 tablet (325 mg total) by mouth 2 (two) times daily with a meal., Disp: 30 tablet, Rfl: 1   fish oil-omega-3 fatty acids 1000 MG capsule, Take 2 g by mouth daily., Disp: , Rfl:    fluticasone (CUTIVATE) 0.05 % cream, Apply to affected area of lower legs bid prn, Disp: 60 g, Rfl: 2   furosemide (LASIX) 40 MG tablet, TAKE 1 TABLET (40 MG TOTAL) BY MOUTH DAILY., Disp: 30 tablet, Rfl: 7   HYDROcodone-acetaminophen (NORCO/VICODIN) 5-325 MG tablet, 1-2 tabs po tid prn pain, Disp: 180 tablet, Rfl: 0   hydrOXYzine (VISTARIL) 25 MG capsule, TAKE 1 TO 2 CAPSULES BY MOUTH AT BEDTIME FOR SLEEP, Disp: 180 capsule, Rfl: 1   levothyroxine (SYNTHROID) 112 MCG tablet, Take 1 tablet (112 mcg total) by mouth daily., Disp: 90 tablet, Rfl: 1   OXYGEN, Inhale into the lungs. 1Lt at bedtime  as needed, Disp: , Rfl:    Potassium Chloride ER 20 MEQ TBCR, TAKE 1 TABLET BY MOUTH EVERY DAY, Disp: 90 tablet, Rfl: 0   rosuvastatin (CRESTOR) 20 MG tablet, TAKE 1 TABLET BY MOUTH EVERY DAY, Disp: 90 tablet, Rfl: 3   Vitamin D, Ergocalciferol, (DRISDOL) 1.25 MG (50000 UNIT) CAPS capsule, Take 1 capsule (50,000 Units total) by mouth 2 (two) times a week., Disp: 24 capsule, Rfl: 3  EXAM:  VITALS per patient if applicable:     09/20/9369   10:37 AM 06/27/2021    2:45 PM 04/25/2021    2:34 PM  Vitals with BMI  Height _0  _1  _2   Weight 189 lbs 196 lbs 6 oz   BMI 69.67 89.38   Systolic 101 751 025  Diastolic 64 46 74  Pulse 54 58 69  GENERAL: alert,  oriented, appears well and in no acute distress  HEENT: atraumatic, conjunttiva clear, no obvious abnormalities on inspection of external nose and ears  NECK: normal movements of the head and neck  LUNGS: on inspection no signs of respiratory distress, breathing rate appears normal, no obvious gross SOB, gasping or wheezing  CV: no obvious cyanosis  MS: moves all visible extremities without noticeable abnormality  PSYCH/NEURO: pleasant and cooperative, no obvious depression or anxiety, speech and thought processing grossly intact  LABS: none today    Chemistry      Component Value Date/Time   NA 140 07/11/2021 0836   NA 141 05/29/2019 1031   NA 143 11/17/2014 0801   K 4.0 07/11/2021 0836   K 3.6 11/17/2014 0801   CL 101 07/11/2021 0836   CO2 33 (H) 07/11/2021 0836   CO2 30 (H) 11/17/2014 0801   BUN 21 07/11/2021 0836   BUN 18 05/29/2019 1031   BUN 16.3 11/17/2014 0801   CREATININE 1.23 (H) 07/11/2021 0836   CREATININE 1.10 (H) 01/28/2021 1505   CREATININE 0.7 11/17/2014 0801      Component Value Date/Time   CALCIUM 9.1 07/11/2021 0836   CALCIUM 8.9 11/17/2014 0801   ALKPHOS 106 03/10/2021 1039   ALKPHOS 70 11/17/2014 0801   AST 26 03/10/2021 1039   AST 32 11/17/2014 0801   ALT 19 03/10/2021 1039   ALT 30 11/17/2014 0801   BILITOT 0.9 03/10/2021 1039   BILITOT 0.5 05/29/2019 1031   BILITOT 0.55 11/17/2014 0801     Lab Results  Component Value Date   CHOL 176 02/23/2020   HDL 71.00 02/23/2020   LDLCALC 74 02/23/2020   LDLDIRECT 106.0 05/17/2018   TRIG 158.0 (H) 02/23/2020   CHOLHDL 2 02/23/2020   Lab Results  Component Value Date   TSH 8.45 (H) 06/27/2021   ASSESSMENT AND PLAN:  Discussed the following assessment and plan:  #1 chronic pain syndrome.  Sellers back pain plus bilateral lower extremity pain due chronic venous insufficiency edema.  Continue one 05-2323 mg Vicodin 3 times a day and she can take 2 tabs around bedtime.  A new prescription was not  needed today. She also applies Voltaren gel several times a day and this helps some.  #2 bilateral lower extremity edema, chronic venous insufficiency. Stable on Lasix 20 mg twice daily and sequential compression boots prescribed by vein specialist.  #3 chronic renal insufficiency stage IIIb. Trying to minimize diuretic. We will check electrolytes and creatinine at next follow-up in 1 month.  #4 hypothyroidism. Takes T4 on empty stomach w/out any other meds. Last TSH was mildly elevated but I did not change her dose (112 mcg daily). Plan repeat TSH next follow-up in 1 month.   I discussed the assessment and treatment plan with the patient. The patient was provided an opportunity to ask questions and all were answered. The patient agreed with the plan and demonstrated an understanding of the instructions.   F/u: 1 month  Signed:  Crissie Sickles, MD           09/28/2021

## 2021-10-13 ENCOUNTER — Encounter: Payer: Self-pay | Admitting: Oncology

## 2021-10-17 ENCOUNTER — Other Ambulatory Visit: Payer: Self-pay | Admitting: Family Medicine

## 2021-10-18 MED ORDER — HYDROCODONE-ACETAMINOPHEN 5-325 MG PO TABS: ORAL_TABLET | ORAL | 0 refills | Status: DC

## 2021-10-18 NOTE — Telephone Encounter (Signed)
Requesting: [employer] Contract: N/A for this med UDS: N/A Last Visit: 09/28/21 Next Visit: 10/26/21 Last Refill: 09/28/21(180,0)  Please Advise. Med pending

## 2021-10-24 ENCOUNTER — Other Ambulatory Visit: Payer: Self-pay | Admitting: Cardiology

## 2021-10-24 DIAGNOSIS — I480 Paroxysmal atrial fibrillation: Secondary | ICD-10-CM

## 2021-10-24 NOTE — Telephone Encounter (Signed)
Prescription refill request for Eliquis received. Indication:Afib  Last office visit: 07/14/21 ([place])  Scr: 1.23 (07/11/21)  Age: 88 Weight: 85.7kg  Appropriate dose and refill sent to requested pharmacy.

## 2021-10-25 DIAGNOSIS — I509 Heart failure, unspecified: Secondary | ICD-10-CM | POA: Diagnosis not present

## 2021-10-25 DIAGNOSIS — R609 Edema, unspecified: Secondary | ICD-10-CM | POA: Diagnosis not present

## 2021-10-26 ENCOUNTER — Ambulatory Visit: Payer: PPO | Admitting: Family Medicine

## 2021-10-31 ENCOUNTER — Encounter: Payer: Self-pay | Admitting: Family Medicine

## 2021-10-31 ENCOUNTER — Ambulatory Visit (INDEPENDENT_AMBULATORY_CARE_PROVIDER_SITE_OTHER): Payer: PPO | Admitting: Family Medicine

## 2021-10-31 VITALS — BP 144/72 | HR 67 | Temp 98.1°F | Ht 66.0 in | Wt 203.2 lb

## 2021-10-31 DIAGNOSIS — M79605 Pain in left leg: Secondary | ICD-10-CM

## 2021-10-31 DIAGNOSIS — N1832 Chronic kidney disease, stage 3b: Secondary | ICD-10-CM | POA: Diagnosis not present

## 2021-10-31 DIAGNOSIS — E039 Hypothyroidism, unspecified: Secondary | ICD-10-CM

## 2021-10-31 DIAGNOSIS — M79604 Pain in right leg: Secondary | ICD-10-CM

## 2021-10-31 DIAGNOSIS — G894 Chronic pain syndrome: Secondary | ICD-10-CM | POA: Diagnosis not present

## 2021-10-31 DIAGNOSIS — Z23 Encounter for immunization: Secondary | ICD-10-CM | POA: Diagnosis not present

## 2021-10-31 DIAGNOSIS — N1831 Chronic kidney disease, stage 3a: Secondary | ICD-10-CM

## 2021-10-31 DIAGNOSIS — R2231 Localized swelling, mass and lump, right upper limb: Secondary | ICD-10-CM

## 2021-10-31 LAB — BASIC METABOLIC PANEL
BUN: 24 mg/dL — ABNORMAL HIGH (ref 6–23)
CO2: 35 mEq/L — ABNORMAL HIGH (ref 19–32)
Calcium: 10 mg/dL (ref 8.4–10.5)
Chloride: 100 mEq/L (ref 96–112)
Creatinine, Ser: 1.1 mg/dL (ref 0.40–1.20)
GFR: 45.26 mL/min — ABNORMAL LOW (ref >60.00)
Glucose, Bld: 112 mg/dL — ABNORMAL HIGH (ref 70–99)
Potassium: 4.7 mEq/L (ref 3.5–5.1)
Sodium: 143 mEq/L (ref 135–145)

## 2021-10-31 LAB — TSH: TSH: 8.22 u[IU]/mL — ABNORMAL HIGH (ref 0.35–5.50)

## 2021-10-31 NOTE — Progress Notes (Signed)
OFFICE VISIT  10/31/2021  CC:  Chief Complaint  Patient presents with   Follow-up    Pain, edema, renal insufficency   Patient is a 88 y.o. female who presents accompanied by her daughter [name] for 1 month follow-up pain, edema, renal insufficiency. A/P as of last visit: "#1 chronic pain syndrome.  Low back pain plus bilateral lower extremity pain due chronic venous insufficiency edema.  Continue one 05-2323 mg Vicodin 3 times a day and she can take 2 tabs around bedtime.  A new prescription was not needed today. She also applies Voltaren gel several times a day and this helps some.   #2 bilateral lower extremity edema, chronic venous insufficiency. Stable on Lasix 20 mg twice daily and sequential compression boots prescribed by vein specialist.   #3 chronic renal insufficiency stage IIIb. Trying to minimize diuretic. We will check electrolytes and creatinine at next follow-up in 1 month.   #4 hypothyroidism. Takes T4 on empty stomach w/out any other meds. Last TSH was mildly elevated but I did not change her dose (112 mcg daily). Plan repeat TSH next follow-up in 1 month"  INTERIM HX: Feeling well. Chronic bilat legs pain stable, L >R. Takes a total of 5 vicodin most days, sometimes 1 more. Says LE swelling has been minimal lately.  Takes 37m lasix bid.  Feels a mass in the right axillary region since having both shingles shot and Prevnar shot in that shoulder at the end of August.  It does not hurt her. She does have a history of non-Hodgkin's lymphoma.  PMP AWARE reviewed today: most recent rx for Vicodin 5-325 was filled 10/18/2021, #180, rx by me. No red flags.  Past Medical History:  Diagnosis Date   Anemia of chronic disease    Anticoagulation adequate, Eliquis 10/16/2014   Anxiety    clonaz helps this AND her breathing   Cholelithiasis without cholecystitis 07/2020   noted on CT abd/pelv at [facility]   Chronic combined systolic and diastolic congestive  heart failure ([place] 004/54/0981  Complicated by atrial fibrillation; Echo 07/23/14: mild LVH, EF 50-55%, Gr 2 DD.;  Echo May 15, 2019: EF 35 to 40%, GRII DD.  (In setting of CHF exacerbation, class III)-> baseline class II symptoms. 09/2019 EF 55-60%, nl wall motion and LV fxn, grd II DD, mod MR.   Chronic renal insufficiency, stage III (moderate) (HCC)    GFR @ 50   COPD (chronic obstructive pulmonary disease) ([place]    recently noted on Xray, does not have any problems   Diverticulosis    Follicular lymphoma ([place]    Non Hodgkins B cell lymphoma; s/p chemo summer 2016--in remission still as of 09/2018: continue obs q 627mo Hiatal hernia 07/2020   moderate size-[facility]     06/2014   HTN (hypertension)    Hypothyroidism    IFG (impaired fasting glucose)    Lip cancer    Dr. ByJanace Hoardxcised this: invasive SCC--no sign of cancer at ENT f/u 10/2015   Lymphedema    R>>L   Microcytic anemia    transfused 3 U total in hosp 07/2014   Obesity (BMI 30-39.9)    Pancytopenia due to antineoplastic chemotherapy ([place]   Persistent atrial fibrillation ([place]   With RVR; elec cardioversion 11/30/14.  On Amio since 10/2014; Dr. NaLenna Gilfordollowing her from pulm standpoint regarding this med.   Positive occult stool blood test 08/08/2014  Endoscopies ok 08/2014   Primary osteoarthritis of both knees 04/2016   Dr. Gladstone Lighter: bone on bone--to get euflexxa to both knees.   Protein calorie malnutrition ([place]) 2022   Pulmonary hypertension due to left heart disease ([place])    RLCP 05/19/2019: EF 35-40%. D1 50%, RI 70%, dLM-LAD 30%.  mPAP 43 mmHg - (62/19 mmHg), PCWP  28 mmHg (V wave 41 mmHg) LVEDP 34 mmHg   Pulmonary metastases 07/24/2014   S/p left hip fracture 05/2020   ORIF   Venous stasis ulcer (HCC)    Right leg   Vitamin B 12 deficiency    Vitamin D deficiency    dose increased to 50 K U tab TWICE weekly 04/2017    Past Surgical History:   Procedure Laterality Date   ABDOMINAL HYSTERECTOMY  1978   BONE MARROW BIOPSY  07/24/2014   BREAST BIOPSY     CARDIOVERSION N/A 10/16/2014   Procedure: CARDIOVERSION;  Surgeon: Lelon Perla, MD;  Location: [facility] ENDOSCOPY;  Service: Cardiovascular;  Laterality: N/A;   CARDIOVERSION N/A 11/30/2014   Procedure: CARDIOVERSION;  Surgeon: Larey Dresser, MD;  Location: [place];  Service: Cardiovascular;  Laterality: N/A;   CATARACT EXTRACTION  2020   OU: Dr. Katy Fitch   COLONOSCOPY  09/23/2014   small hemorrhoids, otherwise normal (performed for IDA and heme+ stool)   COLONOSCOPY     [place]     May 2022   INTRAMEDULLARY (IM) NAIL INTERTROCHANTERIC Left 05/25/2020   Procedure: INTRAMEDULLARY (IM) NAIL INTERTROCHANTRIC;  Surgeon: Erle Crocker, MD;  Location: [place];  Service: Orthopedics;  Laterality: Left;   MASS EXCISION Left 07/15/2015   Left lower lip mass--invasive SCC w/negative margins.  Procedure: EXCISION MASS;  Surgeon: Melissa Montane, MD;  Location: [place];  Service: ENT;  Laterality: Left;  Wedge excision left lower lip mass   NM MYOVIEW LTD  10/29/2014   medium size mild surgery defect in the mid anterior and apical anterior location suggestive of breast attenuation. No reversibility. LOW RISK   PFTs  02/2015   Restriction with diffusion defect: cardiologist referred her to pulm to help interpret PFTs and decide whether she has amiodarone toxicity   RIGHT/LEFT HEART CATH AND CORONARY ANGIOGRAPHY N/A 05/19/2019   Procedure: RIGHT/LEFT HEART CATH AND CORONARY ANGIOGRAPHY;  Surgeon: Troy Sine, MD;  Location: [place] CV LAB;; EF 35-40%. D1 50%, RI 70%, dLM-LAD 30%.  mPAP 43 mmHg - (62/19 mmHg), PCWP  28 mmHg (V wave 41 mmHg) LVEDP 34 mmHg   TEE WITHOUT CARDIOVERSION N/A 10/16/2014   Procedure: TRANSESOPHAGEAL ECHOCARDIOGRAM (TEE);  Surgeon: Lelon Perla, MD;  Location: [facility] ENDOSCOPY;  Service: Cardiovascular;  Laterality: N/A;    TRANSTHORACIC ECHOCARDIOGRAM  10/09/2019   EF 55 to 60%.  GRII DD.  No R WMA.  Normal PAP and RAP.  Severe LA dilation.  Mild RA dilation.  Moderate MR (likely related to annular dilation in the setting of severe LAE.  Mild aortic valve sclerosis-no stenosis.   TRANSTHORACIC ECHOCARDIOGRAM  05/15/2019   Acute CHF exacerbation:  EF 35-40%.  Moderately reduced function.  (Previous EF was 55 to 60%) global HK.  GRII DD.  Mild LA dilation.  Normal RV size.   UPPER GI ENDOSCOPY  09/23/2014   small hiatus hernia, nodules in stomach biopsied (chronic active erosive atrophic gastritis with intestinal metaplasia--no dysplasia or malignancy) otherwise normal    Outpatient Medications Prior to Visit  Medication Sig Dispense Refill  amiodarone (PACERONE) 200 MG tablet Take 0.5 tablets (100 mg total) by mouth daily. 30 tablet 6   apixaban (ELIQUIS) 5 MG TABS tablet TAKE 1 TABLET BY MOUTH TWICE A DAY 60 tablet 6   cyanocobalamin (,VITAMIN B-12,) 1000 MCG/ML injection INJECT 1 ML INTO THE SKIN EVERY 30 DAYS. 3 mL 3   diclofenac Sodium (VOLTAREN) 1 % GEL Apply 2 g topically 4 (four) times daily. Apply to LE joints that are in pain. 300 g 1   ENTRESTO 24-26 MG TAKE 1 TABLET BY MOUTH TWICE A DAY 60 tablet 11   fish oil-omega-3 fatty acids 1000 MG capsule Take 1 g by mouth daily.     fluticasone (CUTIVATE) 0.05 % cream Apply to affected area of lower legs bid prn 60 g 5   furosemide (LASIX) 40 MG tablet TAKE 1 TABLET (40 MG TOTAL) BY MOUTH DAILY. (Patient taking differently: 20 mg 2 (two) times daily. TAKE 1 TABLET (40 MG TOTAL) BY MOUTH DAILY.) 30 tablet 7   HYDROcodone-acetaminophen (NORCO/VICODIN) 5-325 MG tablet 1-2 tabs po qid prn pain 180 tablet 0   hydrOXYzine (VISTARIL) 25 MG capsule TAKE 1 TO 2 CAPSULES BY MOUTH AT BEDTIME FOR SLEEP 180 capsule 1   levothyroxine (SYNTHROID) 112 MCG tablet Take 1 tablet (112 mcg total) by mouth daily. 90 tablet 1   Potassium Chloride ER 20 MEQ TBCR Take 1 tablet by mouth  daily. 90 tablet 3   rosuvastatin (CRESTOR) 20 MG tablet TAKE 1 TABLET BY MOUTH EVERY DAY 90 tablet 3   Vitamin D, Ergocalciferol, (DRISDOL) 1.25 MG (50000 UNIT) CAPS capsule Take 1 capsule (50,000 Units total) by mouth 2 (two) times a week. 24 capsule 3   OXYGEN Inhale into the lungs. 1Lt at bedtime  as needed (Patient not taking: Reported on 10/31/2021)     No facility-administered medications prior to visit.    Allergies  Allergen Reactions   Aspirin Itching   Calcium Alginate Other (See Comments)    Excessive burning   Morphine     Other reaction(s): Hallucinations   Iodinated Contrast Media Rash and Other (See Comments)    CT Contrast    ROS As per HPI  PE:    10/31/2021   10:49 AM 09/28/2021   10:31 AM 07/14/2021   10:37 AM  Vitals with BMI  Height _0   _1   Weight 203 lbs 3 oz  189 lbs  BMI 91.47  82.95  Systolic 621 308 657  Diastolic 72 68 64  Pulse 67 55 54    Physical Exam  Gen: Alert, well appearing.  Patient is oriented to person, place, time, and situation. AFFECT: pleasant, lucid thought and speech. Right axilla: Firm approximately 7 cm x 7 cm mass is palpable, nontender.  It is slightly mobile. Both lower legs have 1-2+ pitting edema.  Right lower leg is noticeably larger than left, which is her baseline.  LABS:  Last CBC Lab Results  Component Value Date   WBC 3.1 (L) 06/27/2021   HGB 13.5 06/27/2021   HCT 40.3 06/27/2021   MCV 90.7 06/27/2021   MCH 31.3 03/23/2021   RDW 12.3 06/27/2021   PLT 123.0 (L) 84/69/6295   Last metabolic panel Lab Results  Component Value Date   GLUCOSE 107 (H) 07/11/2021   NA 140 07/11/2021   K 4.0 07/11/2021   CL 101 07/11/2021   CO2 33 (H) 07/11/2021   BUN 21 07/11/2021   CREATININE 1.23 (H) 07/11/2021   GFRNONAA 57 (  L) 03/10/2021   CALCIUM 9.1 07/11/2021   PHOS 4.7 (H) 05/21/2019   PROT 6.5 03/10/2021   ALBUMIN 4.2 03/10/2021   LABGLOB 3.2 07/23/2014   AGRATIO 0.8 07/23/2014   BILITOT 0.9 03/10/2021    ALKPHOS 106 03/10/2021   AST 26 03/10/2021   ALT 19 03/10/2021   ANIONGAP 8 03/10/2021   Last hemoglobin A1c Lab Results  Component Value Date   HGBA1C 5.5 02/23/2020   Last thyroid functions Lab Results  Component Value Date   TSH 8.45 (H) 06/27/2021   Last vitamin D Lab Results  Component Value Date   VD25OH 51.37 04/25/2021   IMPRESSION AND PLAN:  #1 chronic pain syndrome. Stable.  Continue Vicodin 5-325, 1-2 4 times daily as needed. No new prescription was needed today.  2.  Bilateral lower extremity edema.  Stable. Continue Lasix 20 mg twice daily.  3.  Chronic renal insufficiency stage III. Lites and creatinine today.  4.  Hypothyroidism.  She is on amiodarone. TSH today.  #5 right axillary mass: Could be some reactive lymphadenopathy from having 2 vaccines in that shoulder about 6 weeks ago.   Needs to rule out recurrence of non-Hodgkin's lymphoma.  She is arranging follow-up with her oncologist.  An After Visit Summary was printed and given to the patient.  FOLLOW UP: Return in about 3 months (around 01/31/2022) for routine chronic illness f/u.  Signed:  Crissie Sickles, MD           10/31/2021

## 2021-11-22 ENCOUNTER — Other Ambulatory Visit: Payer: Self-pay | Admitting: Family Medicine

## 2021-11-23 ENCOUNTER — Other Ambulatory Visit: Payer: Self-pay | Admitting: Family Medicine

## 2021-11-23 MED ORDER — HYDROCODONE-ACETAMINOPHEN 5-325 MG PO TABS: ORAL_TABLET | ORAL | 0 refills | Status: DC

## 2021-11-23 NOTE — Telephone Encounter (Signed)
Pt's daughter sent Mychart request yesterday for a re-fill on Mr. Aldaz medication Hydrocodone. She is completely out.

## 2021-11-23 NOTE — Telephone Encounter (Signed)
Requesting: hydrocodone Contract:11/04/18 UDS:n/a Last Visit:10/31/21 Next Visit:01/31/22 Last Refill:10/18/21 (180,0)  Please Advise

## 2021-11-24 ENCOUNTER — Inpatient Hospital Stay: Payer: PPO | Admitting: Oncology

## 2021-11-24 ENCOUNTER — Ambulatory Visit (HOSPITAL_BASED_OUTPATIENT_CLINIC_OR_DEPARTMENT_OTHER)
Admission: RE | Admit: 2021-11-24 | Discharge: 2021-11-24 | Disposition: A | Discharge status: Home / Self Care | Payer: PPO | Source: Ambulatory Visit | Attending: Oncology | Admitting: Oncology

## 2021-11-24 ENCOUNTER — Telehealth: Payer: Self-pay | Admitting: *Deleted

## 2021-11-24 ENCOUNTER — Other Ambulatory Visit: Payer: Self-pay | Admitting: Oncology

## 2021-11-24 ENCOUNTER — Inpatient Hospital Stay: Payer: PPO | Attending: Oncology

## 2021-11-24 VITALS — BP 125/55 | HR 68 | Temp 98.2°F | Resp 18 | Ht 66.0 in | Wt 193.0 lb

## 2021-11-24 DIAGNOSIS — Z79891 Long term (current) use of opiate analgesic: Secondary | ICD-10-CM | POA: Insufficient documentation

## 2021-11-24 DIAGNOSIS — K8020 Calculus of gallbladder without cholecystitis without obstruction: Secondary | ICD-10-CM | POA: Diagnosis not present

## 2021-11-24 DIAGNOSIS — C8200 Follicular lymphoma grade I, unspecified site: Secondary | ICD-10-CM | POA: Insufficient documentation

## 2021-11-24 DIAGNOSIS — R197 Diarrhea, unspecified: Secondary | ICD-10-CM | POA: Diagnosis not present

## 2021-11-24 DIAGNOSIS — C8514 Unspecified B-cell lymphoma, lymph nodes of axilla and upper limb: Secondary | ICD-10-CM | POA: Insufficient documentation

## 2021-11-24 DIAGNOSIS — G893 Neoplasm related pain (acute) (chronic): Secondary | ICD-10-CM | POA: Diagnosis not present

## 2021-11-24 DIAGNOSIS — Z5112 Encounter for antineoplastic immunotherapy: Secondary | ICD-10-CM | POA: Diagnosis not present

## 2021-11-24 DIAGNOSIS — J984 Other disorders of lung: Secondary | ICD-10-CM | POA: Diagnosis not present

## 2021-11-24 DIAGNOSIS — N6310 Unspecified lump in the right breast, unspecified quadrant: Secondary | ICD-10-CM

## 2021-11-24 DIAGNOSIS — R112 Nausea with vomiting, unspecified: Secondary | ICD-10-CM | POA: Diagnosis not present

## 2021-11-24 LAB — CBC WITH DIFFERENTIAL (CANCER CENTER ONLY)
Abs Immature Granulocytes: 0.01 10*3/uL (ref 0.00–0.07)
Basophils Absolute: 0 10*3/uL (ref 0.0–0.1)
Basophils Relative: 0 %
Eosinophils Absolute: 0 10*3/uL (ref 0.0–0.5)
Eosinophils Relative: 1 %
HCT: 39.9 % (ref 36.0–46.0)
Hemoglobin: 13.3 g/dL (ref 12.0–15.0)
Immature Granulocytes: 0 %
Lymphocytes Relative: 24 %
Lymphs Abs: 0.7 10*3/uL (ref 0.7–4.0)
MCH: 30.4 pg (ref 26.0–34.0)
MCHC: 33.3 g/dL (ref 30.0–36.0)
MCV: 91.3 fL (ref 80.0–100.0)
Monocytes Absolute: 0.6 10*3/uL (ref 0.1–1.0)
Monocytes Relative: 19 %
Neutro Abs: 1.7 10*3/uL (ref 1.7–7.7)
Neutrophils Relative %: 56 %
Platelet Count: 108 10*3/uL — ABNORMAL LOW (ref 150–400)
RBC: 4.37 MIL/uL (ref 3.87–5.11)
RDW: 13.6 % (ref 11.5–15.5)
WBC Count: 3 10*3/uL — ABNORMAL LOW (ref 4.0–10.5)
nRBC: 0 % (ref 0.0–0.2)

## 2021-11-24 NOTE — Progress Notes (Signed)
[facility] OFFICE PROGRESS NOTE   Diagnosis: Non-Hodgkin's lymphoma  INTERVAL HISTORY:   Amanda Sellers returns for a scheduled visit.  She has noted a fullness in the right axilla for the past month.  The area is larger than when first noted.  She saw Dr. Anitra Sellers on 10/31/2021.  He noted a 7 x 7 cm mass in the right axilla. There is pain associated with a right axillary mass.  No fever.  Objective:  Vital signs in last 24 hours:  Blood pressure (!) 125/55, pulse 68, temperature 98.2 F (36.8 C), temperature source Oral, resp. rate 18, height _0  (1.676 m), weight 193 lb (87.5 kg), SpO2 95 %.    HEENT: Neck without mass Lymphatics: No cervical, supraclavicular, left axillary, or inguinal nodes.  Large firm mass in the right axilla measuring between 5 and 10 cm. Resp: Lungs clear bilaterally Cardio: Regular rate and rhythm GI: No hepatosplenomegaly Vascular: No leg edema Breast: The right breast is edematous with confluent erythema.  No discrete mass.  Lab Results:  Lab Results  Component Value Date   WBC 3.0 (L) 11/24/2021   HGB 13.3 11/24/2021   HCT 39.9 11/24/2021   MCV 91.3 11/24/2021   PLT 108 (L) 11/24/2021   NEUTROABS 1.7 11/24/2021    CMP  Lab Results  Component Value Date   NA 143 10/31/2021   K 4.7 10/31/2021   CL 100 10/31/2021   CO2 35 (H) 10/31/2021   GLUCOSE 112 (H) 10/31/2021   BUN 24 (H) 10/31/2021   CREATININE 1.10 10/31/2021   CALCIUM 10.0 10/31/2021   PROT 6.5 03/10/2021   ALBUMIN 4.2 03/10/2021   AST 26 03/10/2021   ALT 19 03/10/2021   ALKPHOS 106 03/10/2021   BILITOT 0.9 03/10/2021   GFRNONAA 57 (L) 03/10/2021   GFRAA 50 (L) 05/29/2019    Medications: I have reviewed the patient's current medications.   Assessment/Plan: 1.Non-Hodgkin's Lymphoma-bone marrow biopsy 07/24/2014 consistent with involvement by follicular B-cell lymphoma, CD20 positive   CTs of the chest 07/22/2014 and CT of the abdomen and pelvis on  07/23/2014-pulmonary nodules, hilar/mediastinal/supraclavicular/axillary adenopathy, splenomegaly, abdominal adenopathy, bilateral adrenal nodules   Cycle 1 bendamustine/rituximab 08/04/2014 Cycle 2 bendamustine/Rituxan 09/01/2014 Cycle 3 bendamustine/Rituxan 09/30/2014 Cycle 4 bendamustine/rituximab 10/27/2014 Restaging CT scans 11/17/2014 with significant improvement in diffuse lymphadenopathy, lung nodules, and renal lesions 2. Severe microcytic anemia, status post transfusion with packed red blood cells 07/23/2014   Normal ferritin, low transferrin saturation, "scant" bone marrow iron stores, review of peripheral blood smear consistent with iron deficiency anemia   colonoscopy and upper endoscopy 09/23/2014 3. Exertional dyspnea /orthopnea-most likely secondary to congestive heart failure   4. History of B-12 deficiency   5. Rash over the trunk and extremities 08/18/2014. Appears consistent with a drug rash. Resolved 08/28/2014. Rash 11/18/2014, likely secondary to a contrast allergy  , diagnostic contrast will be listed as an allergy 6. Colonoscopy 06/03/2008. Medium sized internal hemorrhoids.   7. Admission with atrial flutter with a rapid ventricular response 10/15/2014-status post cardioversion 10/16/2014, maintained on     apixaban Repeat cardioversion 11/30/2014   8. Right olecranon skin lesion-likely a lipoma or cyst 9. 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 has a history of non-Hodgkin's lymphoma.  She has been in clinical remission since completing systemic therapy in 2016.  She presents today with a large painful right axillary mass and diffuse right breast edema/erythema. I discussed the differential  diagnosis [name] and her daughter.  The right axillary mass could be related to recurrence/progression of non-Hodgkin's lymphoma or breast cancer. She was referred for CTs today.  I reviewed the CT images and there  appears to be diffuse abnormal density throughout the right breast, axillary lymphadenopathy, and potentially a mass in the lateral aspect of the right breast.  I will refer her for an urgent biopsy.  She will use hydrocodone as needed for pain.  She will be scheduled for an office visit after the biopsy.  Amanda Coder, MD  11/24/2021  11:20 AM

## 2021-11-24 NOTE — Telephone Encounter (Signed)
Per Dr. Benay Spice: Scan confirms mass right breast/axilla. Unable to determine if breast mass or axillary or both. Has been scheduled for biopsy on 11/29/21 at [facility] at 12:45 for 12:15 arrival. No prep needed. Attempted to reach patient without success. LVM that RN will call tomorrow am.

## 2021-11-25 DIAGNOSIS — I509 Heart failure, unspecified: Secondary | ICD-10-CM | POA: Diagnosis not present

## 2021-11-25 DIAGNOSIS — R609 Edema, unspecified: Secondary | ICD-10-CM | POA: Diagnosis not present

## 2021-11-29 ENCOUNTER — Ambulatory Visit
Admission: RE | Admit: 2021-11-29 | Discharge: 2021-11-29 | Disposition: A | Discharge status: Home / Self Care | Payer: PPO | Source: Ambulatory Visit | Attending: Oncology | Admitting: Oncology

## 2021-11-29 ENCOUNTER — Other Ambulatory Visit: Payer: Self-pay | Admitting: Oncology

## 2021-11-29 ENCOUNTER — Other Ambulatory Visit (HOSPITAL_COMMUNITY)
Admission: RE | Admit: 2021-11-29 | Discharge: 2021-11-29 | Disposition: A | Discharge status: Home / Self Care | Payer: PPO | Source: Ambulatory Visit | Attending: Oncology | Admitting: Oncology

## 2021-11-29 DIAGNOSIS — N6310 Unspecified lump in the right breast, unspecified quadrant: Secondary | ICD-10-CM

## 2021-11-29 DIAGNOSIS — R92323 Mammographic fibroglandular density, bilateral breasts: Secondary | ICD-10-CM | POA: Diagnosis not present

## 2021-11-29 DIAGNOSIS — R2231 Localized swelling, mass and lump, right upper limb: Secondary | ICD-10-CM | POA: Diagnosis not present

## 2021-11-29 DIAGNOSIS — C8590 Non-Hodgkin lymphoma, unspecified, unspecified site: Secondary | ICD-10-CM | POA: Diagnosis not present

## 2021-11-29 DIAGNOSIS — N6489 Other specified disorders of breast: Secondary | ICD-10-CM | POA: Diagnosis not present

## 2021-11-29 DIAGNOSIS — C8514 Unspecified B-cell lymphoma, lymph nodes of axilla and upper limb: Secondary | ICD-10-CM | POA: Diagnosis not present

## 2021-11-30 LAB — SURGICAL PATHOLOGY

## 2021-12-02 ENCOUNTER — Encounter: Payer: Self-pay | Admitting: Nurse Practitioner

## 2021-12-02 ENCOUNTER — Inpatient Hospital Stay: Payer: PPO | Admitting: Nurse Practitioner

## 2021-12-02 ENCOUNTER — Inpatient Hospital Stay: Payer: PPO

## 2021-12-02 ENCOUNTER — Encounter: Payer: Self-pay | Admitting: Oncology

## 2021-12-02 ENCOUNTER — Telehealth: Payer: Self-pay | Admitting: Nurse Practitioner

## 2021-12-02 VITALS — BP 130/48 | HR 77 | Temp 98.1°F | Resp 18 | Ht 66.0 in | Wt 199.1 lb

## 2021-12-02 DIAGNOSIS — C8514 Unspecified B-cell lymphoma, lymph nodes of axilla and upper limb: Secondary | ICD-10-CM | POA: Diagnosis not present

## 2021-12-02 DIAGNOSIS — Z5112 Encounter for antineoplastic immunotherapy: Secondary | ICD-10-CM | POA: Diagnosis not present

## 2021-12-02 LAB — CBC WITH DIFFERENTIAL (CANCER CENTER ONLY)
Abs Immature Granulocytes: 0.01 10*3/uL (ref 0.00–0.07)
Basophils Absolute: 0 10*3/uL (ref 0.0–0.1)
Basophils Relative: 0 %
Eosinophils Absolute: 0 10*3/uL (ref 0.0–0.5)
Eosinophils Relative: 0 %
HCT: 39.3 % (ref 36.0–46.0)
Hemoglobin: 13 g/dL (ref 12.0–15.0)
Immature Granulocytes: 0 %
Lymphocytes Relative: 22 %
Lymphs Abs: 0.7 10*3/uL (ref 0.7–4.0)
MCH: 30.5 pg (ref 26.0–34.0)
MCHC: 33.1 g/dL (ref 30.0–36.0)
MCV: 92.3 fL (ref 80.0–100.0)
Monocytes Absolute: 0.5 10*3/uL (ref 0.1–1.0)
Monocytes Relative: 15 %
Neutro Abs: 2 10*3/uL (ref 1.7–7.7)
Neutrophils Relative %: 63 %
Platelet Count: 116 10*3/uL — ABNORMAL LOW (ref 150–400)
RBC: 4.26 MIL/uL (ref 3.87–5.11)
RDW: 14.2 % (ref 11.5–15.5)
WBC Count: 3.2 10*3/uL — ABNORMAL LOW (ref 4.0–10.5)
nRBC: 0 % (ref 0.0–0.2)

## 2021-12-02 LAB — CMP (CANCER CENTER ONLY)
ALT: 12 U/L (ref 0–44)
AST: 24 U/L (ref 15–41)
Albumin: 4.4 g/dL (ref 3.5–5.0)
Alkaline Phosphatase: 49 U/L (ref 38–126)
Anion gap: 10 (ref 5–15)
BUN: 27 mg/dL — ABNORMAL HIGH (ref 8–23)
CO2: 30 mmol/L (ref 22–32)
Calcium: 10.8 mg/dL — ABNORMAL HIGH (ref 8.9–10.3)
Chloride: 101 mmol/L (ref 98–111)
Creatinine: 1.36 mg/dL — ABNORMAL HIGH (ref 0.44–1.00)
GFR, Estimated: 38 mL/min — ABNORMAL LOW (ref >60)
Glucose, Bld: 112 mg/dL — ABNORMAL HIGH (ref 70–99)
Potassium: 4.3 mmol/L (ref 3.5–5.1)
Sodium: 141 mmol/L (ref 135–145)
Total Bilirubin: 0.8 mg/dL (ref 0.3–1.2)
Total Protein: 6.1 g/dL — ABNORMAL LOW (ref 6.5–8.1)

## 2021-12-02 LAB — HEPATITIS B SURFACE ANTIGEN: Hepatitis B Surface Ag: NONREACTIVE

## 2021-12-02 LAB — URIC ACID: Uric Acid, Serum: 8.3 mg/dL — ABNORMAL HIGH (ref 2.5–7.1)

## 2021-12-02 LAB — LACTATE DEHYDROGENASE: LDH: 459 U/L — ABNORMAL HIGH (ref 98–192)

## 2021-12-02 LAB — HEPATITIS B CORE ANTIBODY, TOTAL: Hep B Core Total Ab: NONREACTIVE

## 2021-12-02 MED ORDER — PREDNISONE 20 MG PO TABS: 60.0000 mg | ORAL_TABLET | Freq: Every day | ORAL | 0 refills | Status: AC

## 2021-12-02 MED ORDER — ALLOPURINOL 100 MG PO TABS: 300.0000 mg | ORAL_TABLET | Freq: Every day | ORAL | 0 refills | Status: DC

## 2021-12-02 NOTE — Progress Notes (Signed)
[facility] OFFICE PROGRESS NOTE   Diagnosis: Non-Hodgkin's lymphoma  INTERVAL HISTORY:   Ms. Negrete returns as scheduled.  She is having significant right axillary pain.  Hydrocodone is partially effective.  She noted drainage on her bra this morning.  No fevers or sweats.  Her daughter feels she is eating less than typical for her.  They are not sure about weight loss.  Objective:  Vital signs in last 24 hours:  Blood pressure (!) 130/48, pulse 77, temperature 98.1 F (36.7 C), temperature source Oral, resp. rate 18, height 5' 6" (1.676 m), SpO2 97 %.    Lymphatics: Large firm mass right axilla.  Serosanguineous drainage from a small opening mid axilla.   Resp: Lungs clear bilaterally. Cardio: Regular rate and rhythm. GI: No hepatosplenomegaly. Vascular: No leg edema. Breast: Right breast is edematous with confluent erythema.  Lab Results:  Lab Results  Component Value Date   WBC 3.0 (L) 11/24/2021   HGB 13.3 11/24/2021   HCT 39.9 11/24/2021   MCV 91.3 11/24/2021   PLT 108 (L) 11/24/2021   NEUTROABS 1.7 11/24/2021    Imaging:  No results found.  Medications: I have reviewed the patient's current medications.  Assessment/Plan: 1.Non-Hodgkin's Lymphoma-bone marrow biopsy 07/24/2014 consistent with involvement by follicular B-cell lymphoma, CD20 positive   CTs of the chest 07/22/2014 and CT of the abdomen and pelvis on 07/23/2014-pulmonary nodules, hilar/mediastinal/supraclavicular/axillary adenopathy, splenomegaly, abdominal adenopathy, bilateral adrenal nodules   Cycle 1 bendamustine/rituximab 08/04/2014 Cycle 2 bendamustine/Rituxan 09/01/2014 Cycle 3 bendamustine/Rituxan 09/30/2014 Cycle 4 bendamustine/rituximab 10/27/2014 Restaging CT scans 11/17/2014 with significant improvement in diffuse lymphadenopathy, lung nodules, and renal lesions 2. Severe microcytic anemia, status post transfusion with packed red blood cells 07/23/2014   Normal  ferritin, low transferrin saturation, "scant" bone marrow iron stores, review of peripheral blood smear consistent with iron deficiency anemia   colonoscopy and upper endoscopy 09/23/2014 3. Exertional dyspnea /orthopnea-most likely secondary to congestive heart failure   4. History of B-12 deficiency   5. Rash over the trunk and extremities 08/18/2014. Appears consistent with a drug rash. Resolved 08/28/2014. Rash 11/18/2014, likely secondary to a contrast allergy  , diagnostic contrast will be listed as an allergy 6. Colonoscopy 06/03/2008. Medium sized internal hemorrhoids.   7. Admission with atrial flutter with a rapid ventricular response 10/15/2014-status post cardioversion 10/16/2014, maintained on     apixaban Repeat cardioversion 11/30/2014   8. Right olecranon skin lesion-likely a lipoma or cyst 9. 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  Large right axillary mass 11/24/2021 CT with numerous bulky matted appearing right axillary lymph nodes, largest discretely measurable node measuring 4.6 x 4.3 cm.  Severe edema of the right breast and right chest wall.  No lymphadenopathy or metastatic disease in the abdomen or pelvis.   Bilateral mammogram and ultrasound right breast 11/29/2021-suspicious mass possibly a replaced lymph node right axilla measuring 4.3 cm.  At least 1 additional abnormal lymph node with cortical thickening identified in the right axilla.  Diffuse right breast skin thickening and increased trabeculation.   Biopsy right axillary node 11/29/2021-high-grade B-cell lymphoma with proliferation rate approaching 100%,  29% of the cells kappa restricted CD10 positive B cells    Disposition: Ms. Bobe has been diagnosed with high-grade B-cell non-Hodgkin's involving right axillary lymph nodes.  We reviewed the pathology report with Ms. Schumm and her daughter at today's visit.  Dr. Gearldine Shown recommendation is to proceed with chemotherapy with  Cytoxan/Etoposide/vincristine, prednisone and Rituxan.  We reviewed  potential side effects associated with chemotherapy including bone marrow toxicity, nausea, mouth sores, hair loss, diarrhea or constipation, rash.  We discussed that vincristine is a vesicant and she may require a Port-A-Cath in the future.  We reviewed potential side effects associated with prednisone.  We reviewed side effects associated with Rituxan including an allergic or infusion related reaction, reactivation of hepatitis B, PML, late onset neutropenia.  We discussed the potential for tumor lysis syndrome.  She understands the rationale for taking allopurinol and will begin today.  They are in agreement with the above.  She is symptomatic with significant pain so we are trying to expedite treatment with the plan to complete cycle 1 on 12/05/2021 or 12/06/2021.  She will begin prednisone 60 mg daily for 10 days, first dose today.  She will return for tumor lysis labs 12/09/2021.  We will see her in follow-up 12/13/2021.  We are available to see her sooner if needed.  Patient seen with Dr. Benay Spice.    Ned Card ANP/GNP-BC   12/02/2021  9:45 AM  This was a shared visit with Ned Card.  Ms. Fontes was interviewed and examined.  We reviewed the pathology results with her.  She has been diagnosed with a high-grade lymphoma involving a right axillary mass.  This is likely transformation of the low-grade lymphoma diagnosed in 2016.  We discussed treatment options with Ms. Welsch.  She is symptomatic with pain and right breast swelling.  I recommend systemic therapy.  She does not appear to be a candidate for R-CHOP.  I recommend treatment with R-CEOP.  We reviewed potential toxicities associated with this regimen.  We discussed the possibility of tumor lysis syndrome.  She agrees to proceed. She will begin prednisone today in an attempt to relieve her symptoms while awaiting insurance approval for chemotherapy.  She will  begin allopurinol prophylaxis.  A chemotherapy plan was entered today.  I was present for greater than 50% of today's visit.  I performed medical decision making.  Julieanne Manson, MD

## 2021-12-02 NOTE — Telephone Encounter (Signed)
I contacted Ms. Wildey's daughter [name] regarding the lab results from today.  Creatinine is mildly elevated.  This will change the allopurinol dose from 300 mg daily to 150 mg daily.  Calcium is also elevated.  She will ensure her mother remains adequately hydrated over the weekend, contact the office if she develops lethargy, confusion.

## 2021-12-02 NOTE — Progress Notes (Signed)
START ON PATHWAY REGIMEN - Lymphoma and CLL     A cycle is every 21 days:     Prednisone      Rituximab-xxxx      Cyclophosphamide      Vincristine      Etoposide   **Always confirm dose/schedule in your pharmacy ordering system**  Patient Characteristics: Diffuse Large B-Cell Lymphoma or Follicular Lymphoma, Grade 3B, First Line, Stage I and II, No Bulk Disease Type: Not Applicable Disease Type: Diffuse Large B-Cell Lymphoma Disease Type: Not Applicable Line of therapy: First Line Disease Characteristics: No Bulk Intent of Therapy: Curative Intent, Discussed with Patient

## 2021-12-04 ENCOUNTER — Other Ambulatory Visit: Payer: Self-pay | Admitting: Oncology

## 2021-12-04 DIAGNOSIS — C8514 Unspecified B-cell lymphoma, lymph nodes of axilla and upper limb: Secondary | ICD-10-CM

## 2021-12-05 ENCOUNTER — Other Ambulatory Visit: Payer: Self-pay | Admitting: Nurse Practitioner

## 2021-12-05 DIAGNOSIS — C8514 Unspecified B-cell lymphoma, lymph nodes of axilla and upper limb: Secondary | ICD-10-CM

## 2021-12-06 ENCOUNTER — Inpatient Hospital Stay: Payer: PPO

## 2021-12-06 ENCOUNTER — Other Ambulatory Visit: Payer: Self-pay

## 2021-12-06 ENCOUNTER — Other Ambulatory Visit: Payer: Self-pay | Admitting: *Deleted

## 2021-12-06 VITALS — BP 127/48 | HR 72 | Temp 98.2°F | Resp 18 | Ht 66.0 in | Wt 203.0 lb

## 2021-12-06 DIAGNOSIS — C8514 Unspecified B-cell lymphoma, lymph nodes of axilla and upper limb: Secondary | ICD-10-CM

## 2021-12-06 DIAGNOSIS — Z5112 Encounter for antineoplastic immunotherapy: Secondary | ICD-10-CM | POA: Diagnosis not present

## 2021-12-06 LAB — CBC WITH DIFFERENTIAL (CANCER CENTER ONLY)
Abs Immature Granulocytes: 0.02 10*3/uL (ref 0.00–0.07)
Basophils Absolute: 0 10*3/uL (ref 0.0–0.1)
Basophils Relative: 0 %
Eosinophils Absolute: 0 10*3/uL (ref 0.0–0.5)
Eosinophils Relative: 1 %
HCT: 40.3 % (ref 36.0–46.0)
Hemoglobin: 13.4 g/dL (ref 12.0–15.0)
Immature Granulocytes: 1 %
Lymphocytes Relative: 17 %
Lymphs Abs: 0.6 10*3/uL — ABNORMAL LOW (ref 0.7–4.0)
MCH: 30.5 pg (ref 26.0–34.0)
MCHC: 33.3 g/dL (ref 30.0–36.0)
MCV: 91.6 fL (ref 80.0–100.0)
Monocytes Absolute: 0.7 10*3/uL (ref 0.1–1.0)
Monocytes Relative: 18 %
Neutro Abs: 2.4 10*3/uL (ref 1.7–7.7)
Neutrophils Relative %: 63 %
Platelet Count: 130 10*3/uL — ABNORMAL LOW (ref 150–400)
RBC: 4.4 MIL/uL (ref 3.87–5.11)
RDW: 14.1 % (ref 11.5–15.5)
WBC Count: 3.7 10*3/uL — ABNORMAL LOW (ref 4.0–10.5)
nRBC: 0 % (ref 0.0–0.2)

## 2021-12-06 LAB — CMP (CANCER CENTER ONLY)
ALT: 11 U/L (ref 0–44)
AST: 16 U/L (ref 15–41)
Albumin: 4.2 g/dL (ref 3.5–5.0)
Alkaline Phosphatase: 50 U/L (ref 38–126)
Anion gap: 11 (ref 5–15)
BUN: 29 mg/dL — ABNORMAL HIGH (ref 8–23)
CO2: 30 mmol/L (ref 22–32)
Calcium: 10.3 mg/dL (ref 8.9–10.3)
Chloride: 98 mmol/L (ref 98–111)
Creatinine: 1 mg/dL (ref 0.44–1.00)
GFR, Estimated: 55 mL/min — ABNORMAL LOW (ref >60)
Glucose, Bld: 118 mg/dL — ABNORMAL HIGH (ref 70–99)
Potassium: 3.7 mmol/L (ref 3.5–5.1)
Sodium: 139 mmol/L (ref 135–145)
Total Bilirubin: 0.5 mg/dL (ref 0.3–1.2)
Total Protein: 6.1 g/dL — ABNORMAL LOW (ref 6.5–8.1)

## 2021-12-06 LAB — LACTATE DEHYDROGENASE: LDH: 275 U/L — ABNORMAL HIGH (ref 98–192)

## 2021-12-06 LAB — URIC ACID: Uric Acid, Serum: 5.4 mg/dL (ref 2.5–7.1)

## 2021-12-06 LAB — PHOSPHORUS: Phosphorus: 2.3 mg/dL — ABNORMAL LOW (ref 2.5–4.6)

## 2021-12-06 MED ORDER — SODIUM CHLORIDE 0.9 % IV SOLN
50.0000 mg/m2 | Freq: Once | INTRAVENOUS | Status: AC
  Administered 2021-12-06: 100 mg via INTRAVENOUS
  Filled 2021-12-06: qty 5

## 2021-12-06 MED ORDER — VINCRISTINE SULFATE CHEMO INJECTION 1 MG/ML
1.0000 mg | Freq: Once | INTRAVENOUS | Status: AC
  Administered 2021-12-06: 1 mg via INTRAVENOUS
  Filled 2021-12-06: qty 1

## 2021-12-06 MED ORDER — SODIUM CHLORIDE 0.9 % IV SOLN
600.0000 mg/m2 | Freq: Once | INTRAVENOUS | Status: AC
  Administered 2021-12-06: 1240 mg via INTRAVENOUS
  Filled 2021-12-06: qty 25

## 2021-12-06 MED ORDER — ACETAMINOPHEN 325 MG PO TABS
650.0000 mg | ORAL_TABLET | Freq: Once | ORAL | Status: AC
  Administered 2021-12-06: 650 mg via ORAL
  Filled 2021-12-06: qty 2

## 2021-12-06 MED ORDER — PALONOSETRON HCL INJECTION 0.25 MG/5ML
0.2500 mg | Freq: Once | INTRAVENOUS | Status: AC
  Administered 2021-12-06: 0.25 mg via INTRAVENOUS
  Filled 2021-12-06: qty 5

## 2021-12-06 MED ORDER — SODIUM CHLORIDE 0.9 % IV SOLN
10.0000 mg | Freq: Once | INTRAVENOUS | Status: AC
  Administered 2021-12-06: 10 mg via INTRAVENOUS
  Filled 2021-12-06: qty 1

## 2021-12-06 MED ORDER — PROCHLORPERAZINE MALEATE 5 MG PO TABS: 5.0000 mg | ORAL_TABLET | Freq: Four times a day (QID) | ORAL | 1 refills | Status: DC | PRN

## 2021-12-06 MED ORDER — SODIUM CHLORIDE 0.9 % IV SOLN
375.0000 mg/m2 | Freq: Once | INTRAVENOUS | Status: AC
  Administered 2021-12-06: 800 mg via INTRAVENOUS
  Filled 2021-12-06: qty 30

## 2021-12-06 MED ORDER — DIPHENHYDRAMINE HCL 25 MG PO CAPS
25.0000 mg | ORAL_CAPSULE | Freq: Once | ORAL | Status: AC
  Administered 2021-12-06: 25 mg via ORAL
  Filled 2021-12-06: qty 1

## 2021-12-06 MED ORDER — SODIUM CHLORIDE 0.9 % IV SOLN: Freq: Once | INTRAVENOUS | Status: AC

## 2021-12-06 NOTE — Patient Instructions (Signed)
[facility]   Discharge Instructions: Thank you for choosing [facility] to provide your oncology and hematology care.   If you have a lab appointment with the [facility], please go directly to the [facility] and check in at the registration area.   Wear comfortable clothing and clothing appropriate for easy access to any Portacath or PICC line.   We strive to give you quality time with your provider. You may need to reschedule your appointment if you arrive late (15 or more minutes).  Arriving late affects you and other patients whose appointments are after yours.  Also, if you miss three or more appointments without notifying the office, you may be dismissed from the clinic at the provider's discretion.      For prescription refill requests, have your pharmacy contact our office and allow 72 hours for refills to be completed.    Today you received the following chemotherapy and/or immunotherapy agents Vincristine, Cytoxan, Etoposide, Rituxan.     To help prevent nausea and vomiting after your treatment, we encourage you to take your nausea medication as directed.  BELOW ARE SYMPTOMS THAT SHOULD BE REPORTED IMMEDIATELY: *FEVER GREATER THAN 100.4 F (38 C) OR HIGHER *CHILLS OR SWEATING *NAUSEA AND VOMITING THAT IS NOT CONTROLLED WITH YOUR NAUSEA MEDICATION *UNUSUAL SHORTNESS OF BREATH *UNUSUAL BRUISING OR BLEEDING *URINARY PROBLEMS (pain or burning when urinating, or frequent urination) *BOWEL PROBLEMS (unusual diarrhea, constipation, pain near the anus) TENDERNESS IN MOUTH AND THROAT WITH OR WITHOUT PRESENCE OF ULCERS (sore throat, sores in mouth, or a toothache) UNUSUAL RASH, SWELLING OR PAIN  UNUSUAL VAGINAL DISCHARGE OR ITCHING   Items with * indicate a potential emergency and should be followed up as soon as possible or go to the Emergency Department if any problems should occur.  Please show the CHEMOTHERAPY ALERT CARD or  IMMUNOTHERAPY ALERT CARD at check-in to the Emergency Department and triage nurse.  Should you have questions after your visit or need to cancel or reschedule your appointment, please contact [facility]  Dept: [phone]  and follow the prompts.  Office hours are 8:00 a.m. to 4:30 p.m. Monday - Friday. Please note that voicemails left after 4:00 p.m. may not be returned until the following business day.  We are closed weekends and major holidays. You have access to a nurse at all times for urgent questions. Please call the main number to the clinic Dept: [phone] and follow the prompts.   For any non-urgent questions, you may also contact your provider using MyChart. We now offer e-Visits for anyone 67 and older to request care online for non-urgent symptoms. For details visit [email or web address].   Also download the MyChart app! Go to the app store, search "MyChart", open the app, select [facility], and log in with your MyChart username and password.  Masks are optional in the cancer centers. If you would like for your care team to wear a mask while they are taking care of you, please let them know. You may have one support person who is at least 86 years old accompany you for your appointments.  Vincristine Injection What is this medication? VINCRISTINE (vin KRIS teen) treats some types of cancer. It works by slowing down the growth of cancer cells. This medicine may be used for other purposes; ask your health care provider or pharmacist if you have questions. COMMON BRAND NAME(S): Oncovin, Vincasar PFS What should I tell my care team before  I take this medication? They need to know if you have any of these conditions: Infection Kidney disease Liver disease Low white blood cell levels Lung disease Nervous system disease, such as Charcot-Marie-Tooth (CMT) Recent or ongoing radiation therapy An unusual or allergic reaction to vincristine, other  chemotherapy agents, other medications, foods, dyes, or preservatives Pregnant or trying to get pregnant Breast-feeding How should I use this medication? This medication is infused into a vein. It is given by your care team in a hospital or clinic setting. Talk to your care team about the use of this medication in children. While it may be given to children for selected conditions, precautions do apply. Overdosage: If you think you have taken too much of this medicine contact a poison control center or emergency room at once. NOTE: This medicine is only for you. Do not share this medicine with others. What if I miss a dose? Keep appointments for follow-up doses. It is important not to miss your dose. Call your care team if you are unable to keep an appointment. What may interact with this medication? Do not take this medication with any of the following: Live virus vaccines This medication may also interact with the following: Medications for fungal infections, such as itraconazole or fluconazole Phenytoin Supplements, such as St. John's wort This list may not describe all possible interactions. Give your health care provider a list of all the medicines, herbs, non-prescription drugs, or dietary supplements you use. Also tell them if you smoke, drink alcohol, or use illegal drugs. Some items may interact with your medicine. What should I watch for while using this medication? Your condition will be monitored carefully while you are receiving this medication. This medication may make you feel generally unwell. This is not uncommon as chemotherapy can affect healthy cells as well as cancer cells. Report any side effects. Continue your course of treatment even though you feel ill unless your care team tells you to stop. You may need blood work while taking this medication. This medication may increase your risk to bruise or bleed. Call your care team if you notice any unusual bleeding. This  medication may increase your risk of getting an infection. Call your care team for advice if you get a fever, chills, sore throat, or other symptoms of a cold or flu. Do not treat yourself. Try to avoid being around people who are sick. This medication will cause constipation. If you do not have a bowel movement for 3 days, call your care team. Call your care team if you are around anyone with measles, chickenpox, or if you develop sores or blisters that do not heal properly. Be careful brushing or flossing your teeth or using a toothpick because you may get an infection or bleed more easily. If you have any dental work done, tell your dentist you are receiving this medication Talk to your care team if you or your partner wish to become pregnant or think either of you might be pregnant. This medication can cause serious birth defects. This medication may cause infertility. Talk to your care team if you are concerned about your fertility. Talk to your care team before breastfeeding. Changes to your treatment plan may be needed. What side effects may I notice from receiving this medication? Side effects that you should report to your care team as soon as possible: Allergic reactions--skin rash, itching, hives, swelling of the face, lips, tongue, or throat High uric acid level--severe pain, redness, warmth, or swelling in  joints, pain or trouble passing urine, pain in the lower back or sides Infection--fever, chills, cough, sore throat, wounds that don't heal, pain or trouble when passing urine, general feeling of discomfort or being unwell Pain, tingling, or numbness in the hands or feet, muscle weakness, change in vision, confusion or trouble speaking, loss of balance or coordination, trouble walking, seizures Painful swelling, warmth, or redness of the skin, blisters or sores at the infusion site Shortness of breath or trouble breathing Side effects that usually do not require medical attention  (report to your care team if they continue or are bothersome): Constipation Diarrhea Hair loss Loss of appetite Nausea Stomach cramping Vomiting This list may not describe all possible side effects. Call your doctor for medical advice about side effects. You may report side effects to FDA at 1-800-FDA-1088. Where should I keep my medication? This medication is given in a hospital or clinic. It will not be stored at home. NOTE: This sheet is a summary. It may not cover all possible information. If you have questions about this medicine, talk to your doctor, pharmacist, or health care provider.  2023 Elsevier/Gold Standard (2021-04-05 00:00:00)  Cyclophosphamide Injection What is this medication? CYCLOPHOSPHAMIDE (sye kloe FOSS fa mide) treats some types of cancer. It works by slowing down the growth of cancer cells. This medicine may be used for other purposes; ask your health care provider or pharmacist if you have questions. COMMON BRAND NAME(S): Cyclophosphamide, Cytoxan, Neosar What should I tell my care team before I take this medication? They need to know if you have any of these conditions: Heart disease Irregular heartbeat or rhythm Infection Kidney problems Liver disease Low blood cell levels (white cells, platelets, or red blood cells) Lung disease Previous radiation Trouble passing urine An unusual or allergic reaction to cyclophosphamide, other medications, foods, dyes, or preservatives Pregnant or trying to get pregnant Breast-feeding How should I use this medication? This medication is injected into a vein. It is given by your care team in a hospital or clinic setting. Talk to your care team about the use of this medication in children. Special care may be needed. Overdosage: If you think you have taken too much of this medicine contact a poison control center or emergency room at once. NOTE: This medicine is only for you. Do not share this medicine with  others. What if I miss a dose? Keep appointments for follow-up doses. It is important not to miss your dose. Call your care team if you are unable to keep an appointment. What may interact with this medication? Amphotericin B Amiodarone Azathioprine Certain antivirals for HIV or hepatitis Certain medications for blood pressure, such as enalapril, lisinopril, quinapril Cyclosporine Diuretics Etanercept Indomethacin Medications that relax muscles Metronidazole Natalizumab Tamoxifen Warfarin This list may not describe all possible interactions. Give your health care provider a list of all the medicines, herbs, non-prescription drugs, or dietary supplements you use. Also tell them if you smoke, drink alcohol, or use illegal drugs. Some items may interact with your medicine. What should I watch for while using this medication? This medication may make you feel generally unwell. This is not uncommon as chemotherapy can affect healthy cells as well as cancer cells. Report any side effects. Continue your course of treatment even though you feel ill unless your care team tells you to stop. You may need blood work while you are taking this medication. This medication may increase your risk of getting an infection. Call your care team for advice  if you get a fever, chills, sore throat, or other symptoms of a cold or flu. Do not treat yourself. Try to avoid being around people who are sick. Avoid taking medications that contain aspirin, acetaminophen, ibuprofen, naproxen, or ketoprofen unless instructed by your care team. These medications may hide a fever. Be careful brushing or flossing your teeth or using a toothpick because you may get an infection or bleed more easily. If you have any dental work done, tell your dentist you are receiving this medication. Drink water or other fluids as directed. Urinate often, even at night. Some products may contain alcohol. Ask your care team if this medication  contains alcohol. Be sure to tell all care teams you are taking this medicine. Certain medicines, like metronidazole and disulfiram, can cause an unpleasant reaction when taken with alcohol. The reaction includes flushing, headache, nausea, vomiting, sweating, and increased thirst. The reaction can last from 30 minutes to several hours. Talk to your care team if you wish to become pregnant or think you might be pregnant. This medication can cause serious birth defects if taken during pregnancy and for 1 year after the last dose. A negative pregnancy test is required before starting this medication. A reliable form of contraception is recommended while taking this medication and for 1 year after the last dose. Talk to your care team about reliable forms of contraception. Do not father a child while taking this medication and for 4 months after the last dose. Use a condom during this time period. Do not breast-feed while taking this medication or for 1 week after the last dose. This medication may cause infertility. Talk to your care team if you are concerned about your fertility. Talk to your care team about your risk of cancer. You may be more at risk for certain types of cancer if you take this medication. What side effects may I notice from receiving this medication? Side effects that you should report to your care team as soon as possible: Allergic reactions--skin rash, itching, hives, swelling of the face, lips, tongue, or throat Dry cough, shortness of breath or trouble breathing Heart failure--shortness of breath, swelling of the ankles, feet, or hands, sudden weight gain, unusual weakness or fatigue Heart muscle inflammation--unusual weakness or fatigue, shortness of breath, chest pain, fast or irregular heartbeat, dizziness, swelling of the ankles, feet, or hands Heart rhythm changes--fast or irregular heartbeat, dizziness, feeling faint or lightheaded, chest pain, trouble  breathing Infection--fever, chills, cough, sore throat, wounds that don't heal, pain or trouble when passing urine, general feeling of discomfort or being unwell Kidney injury--decrease in the amount of urine, swelling of the ankles, hands, or feet Liver injury--right upper belly pain, loss of appetite, nausea, light-colored stool, dark yellow or brown urine, yellowing skin or eyes, unusual weakness or fatigue Low red blood cell level--unusual weakness or fatigue, dizziness, headache, trouble breathing Low sodium level--muscle weakness, fatigue, dizziness, headache, confusion Red or dark brown urine Unusual bruising or bleeding Side effects that usually do not require medical attention (report to your care team if they continue or are bothersome): Hair loss Irregular menstrual cycles or spotting Loss of appetite Nausea Pain, redness, or swelling with sores inside the mouth or throat Vomiting This list may not describe all possible side effects. Call your doctor for medical advice about side effects. You may report side effects to FDA at 1-800-FDA-1088. Where should I keep my medication? This medication is given in a hospital or clinic. It will not be  stored at home. NOTE: This sheet is a summary. It may not cover all possible information. If you have questions about this medicine, talk to your doctor, pharmacist, or health care provider.  2023 Elsevier/Gold Standard (2021-03-01 00:00:00)  Etoposide Injection What is this medication? ETOPOSIDE (e toe POE side) treats some types of cancer. It works by slowing down the growth of cancer cells. This medicine may be used for other purposes; ask your health care provider or pharmacist if you have questions. COMMON BRAND NAME(S): Etopophos, Toposar, VePesid What should I tell my care team before I take this medication? They need to know if you have any of these conditions: Infection Kidney disease Liver disease Low blood counts, such as low  white cell, platelet, red cell counts An unusual or allergic reaction to etoposide, other medications, foods, dyes, or preservatives If you or your partner are pregnant or trying to get pregnant Breastfeeding How should I use this medication? This medication is injected into a vein. It is given by your care team in a hospital or clinic setting. Talk to your care team about the use of this medication in children. Special care may be needed. Overdosage: If you think you have taken too much of this medicine contact a poison control center or emergency room at once. NOTE: This medicine is only for you. Do not share this medicine with others. What if I miss a dose? Keep appointments for follow-up doses. It is important not to miss your dose. Call your care team if you are unable to keep an appointment. What may interact with this medication? Warfarin This list may not describe all possible interactions. Give your health care provider a list of all the medicines, herbs, non-prescription drugs, or dietary supplements you use. Also tell them if you smoke, drink alcohol, or use illegal drugs. Some items may interact with your medicine. What should I watch for while using this medication? Your condition will be monitored carefully while you are receiving this medication. This medication may make you feel generally unwell. This is not uncommon as chemotherapy can affect healthy cells as well as cancer cells. Report any side effects. Continue your course of treatment even though you feel ill unless your care team tells you to stop. This medication can cause serious side effects. To reduce the risk, your care team may give you other medications to take before receiving this one. Be sure to follow the directions from your care team. This medication may increase your risk of getting an infection. Call your care team for advice if you get a fever, chills, sore throat, or other symptoms of a cold or flu. Do not  treat yourself. Try to avoid being around people who are sick. This medication may increase your risk to bruise or bleed. Call your care team if you notice any unusual bleeding. Talk to your care team about your risk of cancer. You may be more at risk for certain types of cancers if you take this medication. Talk to your care team if you may be pregnant. Serious birth defects can occur if you take this medication during pregnancy and for 6 months after the last dose. You will need a negative pregnancy test before starting this medication. Contraception is recommended while taking this medication and for 6 months after the last dose. Your care team can help you find the option that works for you. If your partner can get pregnant, use a condom during sex while taking this medication and for  4 months after the last dose. Do not breastfeed while taking this medication. This medication may cause infertility. Talk to your care team if you are concerned about your fertility. What side effects may I notice from receiving this medication? Side effects that you should report to your care team as soon as possible: Allergic reactions--skin rash, itching, hives, swelling of the face, lips, tongue, or throat Infection--fever, chills, cough, sore throat, wounds that don't heal, pain or trouble when passing urine, general feeling of discomfort or being unwell Low red blood cell level--unusual weakness or fatigue, dizziness, headache, trouble breathing Unusual bruising or bleeding Side effects that usually do not require medical attention (report to your care team if they continue or are bothersome): Diarrhea Fatigue Hair loss Loss of appetite Nausea Vomiting This list may not describe all possible side effects. Call your doctor for medical advice about side effects. You may report side effects to FDA at 1-800-FDA-1088. Where should I keep my medication? This medication is given in a hospital or clinic. It will  not be stored at home. NOTE: This sheet is a summary. It may not cover all possible information. If you have questions about this medicine, talk to your doctor, pharmacist, or health care provider.  2023 Elsevier/Gold Standard (2007-03-02 00:00:00)  Rituximab Injection What is this medication? RITUXIMAB (ri TUX i mab) treats leukemia and lymphoma. It works by blocking a protein that causes cancer cells to grow and multiply. This helps to slow or stop the spread of cancer cells. It may also be used to treat autoimmune conditions, such as arthritis. It works by slowing down an overactive immune system. It is a monoclonal antibody. This medicine may be used for other purposes; ask your health care provider or pharmacist if you have questions. COMMON BRAND NAME(S): RIABNI, Rituxan, RUXIENCE, truxima What should I tell my care team before I take this medication? They need to know if you have any of these conditions: Chest pain Heart disease Immune system problems Infection, such as chickenpox, cold sores, hepatitis B, herpes Irregular heartbeat or rhythm Kidney disease Low blood counts, such as low white cells, platelets, red cells Lung disease Recent or upcoming vaccine An unusual or allergic reaction to rituximab, other medications, foods, dyes, or preservatives Pregnant or trying to get pregnant Breast-feeding How should I use this medication? This medication is injected into a vein. It is given by a care team in a hospital or clinic setting. A special MedGuide will be given to you before each treatment. Be sure to read this information carefully each time. Talk to your care team about the use of this medication in children. While this medication may be prescribed for children as young as 6 months for selected conditions, precautions do apply. Overdosage: If you think you have taken too much of this medicine contact a poison control center or emergency room at once. NOTE: This medicine is  only for you. Do not share this medicine with others. What if I miss a dose? Keep appointments for follow-up doses. It is important not to miss your dose. Call your care team if you are unable to keep an appointment. What may interact with this medication? Do not take this medication with any of the following: Live vaccines This medication may also interact with the following: Cisplatin This list may not describe all possible interactions. Give your health care provider a list of all the medicines, herbs, non-prescription drugs, or dietary supplements you use. Also tell them if you smoke,  drink alcohol, or use illegal drugs. Some items may interact with your medicine. What should I watch for while using this medication? Your condition will be monitored carefully while you are receiving this medication. You may need blood work while taking this medication. This medication can cause serious infusion reactions. To reduce the risk your care team may give you other medications to take before receiving this one. Be sure to follow the directions from your care team. This medication may increase your risk of getting an infection. Call your care team for advice if you get a fever, chills, sore throat, or other symptoms of a cold or flu. Do not treat yourself. Try to avoid being around people who are sick. Call your care team if you are around anyone with measles, chickenpox, or if you develop sores or blisters that do not heal properly. Avoid taking medications that contain aspirin, acetaminophen, ibuprofen, naproxen, or ketoprofen unless instructed by your care team. These medications may hide a fever. This medication may cause serious skin reactions. They can happen weeks to months after starting the medication. Contact your care team right away if you notice fevers or flu-like symptoms with a rash. The rash may be red or purple and then turn into blisters or peeling of the skin. You may also notice a red  rash with swelling of the face, lips, or lymph nodes in your neck or under your arms. In some patients, this medication may cause a serious brain infection that may cause death. If you have any problems seeing, thinking, speaking, walking, or standing, tell your care team right away. If you cannot reach your care team, urgently seek another source of medical care. Talk to your care team if you may be pregnant. Serious birth defects can occur if you take this medication during pregnancy and for 12 months after the last dose. You will need a negative pregnancy test before starting this medication. Contraception is recommended while taking this medication and for 12 months after the last dose. Your care team can help you find the option that works for you. Do not breastfeed while taking this medication and for at least 6 months after the last dose. What side effects may I notice from receiving this medication? Side effects that you should report to your care team as soon as possible: Allergic reactions or angioedema--skin rash, itching or hives, swelling of the face, eyes, lips, tongue, arms, or legs, trouble swallowing or breathing Bowel blockage--stomach cramping, unable to have a bowel movement or pass gas, loss of appetite, vomiting Dizziness, loss of balance or coordination, confusion or trouble speaking Heart attack--pain or tightness in the chest, shoulders, arms, or jaw, nausea, shortness of breath, cold or clammy skin, feeling faint or lightheaded Heart rhythm changes--fast or irregular heartbeat, dizziness, feeling faint or lightheaded, chest pain, trouble breathing Infection--fever, chills, cough, sore throat, wounds that don't heal, pain or trouble when passing urine, general feeling of discomfort or being unwell Infusion reactions--chest pain, shortness of breath or trouble breathing, feeling faint or lightheaded Kidney injury--decrease in the amount of urine, swelling of the ankles, hands, or  feet Liver injury--right upper belly pain, loss of appetite, nausea, light-colored stool, dark yellow or brown urine, yellowing skin or eyes, unusual weakness or fatigue Redness, blistering, peeling, or loosening of the skin, including inside the mouth Stomach pain that is severe, does not go away, or gets worse Tumor lysis syndrome (TLS)--nausea, vomiting, diarrhea, decrease in the amount of urine, dark urine, unusual weakness  or fatigue, confusion, muscle pain or cramps, fast or irregular heartbeat, joint pain Side effects that usually do not require medical attention (report to your care team if they continue or are bothersome): Headache Joint pain Nausea Runny or stuffy nose Unusual weakness or fatigue This list may not describe all possible side effects. Call your doctor for medical advice about side effects. You may report side effects to FDA at 1-800-FDA-1088. Where should I keep my medication? This medication is given in a hospital or clinic. It will not be stored at home. NOTE: This sheet is a summary. It may not cover all possible information. If you have questions about this medicine, talk to your doctor, pharmacist, or health care provider.  2023 Elsevier/Gold Standard (2021-05-24 00:00:00)

## 2021-12-06 NOTE — Progress Notes (Signed)
Pharmacist Chemotherapy Monitoring - Initial Assessment    Anticipated start date: 12/06/21   The following has been reviewed per standard work regarding the patient's treatment regimen: The patient's diagnosis, treatment plan and drug doses, and organ/hematologic function Lab orders and baseline tests specific to treatment regimen  The treatment plan start date, drug sequencing, and pre-medications Prior authorization status  Patient's documented medication list, including drug-drug interaction screen and prescriptions for anti-emetics and supportive care specific to the treatment regimen The drug concentrations, fluid compatibility, administration routes, and timing of the medications to be used The patient's access for treatment and lifetime cumulative dose history, if applicable  The patient's medication allergies and previous infusion related reactions, if applicable   Changes made to treatment plan:  N/A  Follow up needed:  Baseline labs drawn    Amanda Sellers, [facility], 12/06/2021  9:25 AM

## 2021-12-06 NOTE — Progress Notes (Signed)
Vincristine given PIV fluids infused at 200 mls/hr Good blood return before and after infusion. Vincristine ran over 20 minutes.

## 2021-12-07 ENCOUNTER — Telehealth: Payer: Self-pay | Admitting: Emergency Medicine

## 2021-12-07 NOTE — Telephone Encounter (Signed)
24 Hour Callback 24 Hour callback post 1st time Vincristine, Cytoxan, Etoposide and Rituxan.  Spoke to patients daughter Baron Sane, reports that patient has had no V/D had some nausea and has taken compazine x 2 with good results. Patient is eating and drinking well. Family knows to call with any questions or problems

## 2021-12-09 ENCOUNTER — Inpatient Hospital Stay: Payer: PPO

## 2021-12-09 DIAGNOSIS — Z5112 Encounter for antineoplastic immunotherapy: Secondary | ICD-10-CM | POA: Diagnosis not present

## 2021-12-09 DIAGNOSIS — C8514 Unspecified B-cell lymphoma, lymph nodes of axilla and upper limb: Secondary | ICD-10-CM

## 2021-12-09 LAB — CBC WITH DIFFERENTIAL (CANCER CENTER ONLY)
Abs Immature Granulocytes: 0 10*3/uL (ref 0.00–0.07)
Basophils Absolute: 0 10*3/uL (ref 0.0–0.1)
Basophils Relative: 0 %
Eosinophils Absolute: 0 10*3/uL (ref 0.0–0.5)
Eosinophils Relative: 1 %
HCT: 37.2 % (ref 36.0–46.0)
Hemoglobin: 12.4 g/dL (ref 12.0–15.0)
Immature Granulocytes: 0 %
Lymphocytes Relative: 12 %
Lymphs Abs: 0.3 10*3/uL — ABNORMAL LOW (ref 0.7–4.0)
MCH: 30.4 pg (ref 26.0–34.0)
MCHC: 33.3 g/dL (ref 30.0–36.0)
MCV: 91.2 fL (ref 80.0–100.0)
Monocytes Absolute: 0.2 10*3/uL (ref 0.1–1.0)
Monocytes Relative: 6 %
Neutro Abs: 2.2 10*3/uL (ref 1.7–7.7)
Neutrophils Relative %: 81 %
Platelet Count: 120 10*3/uL — ABNORMAL LOW (ref 150–400)
RBC: 4.08 MIL/uL (ref 3.87–5.11)
RDW: 13.4 % (ref 11.5–15.5)
WBC Count: 2.8 10*3/uL — ABNORMAL LOW (ref 4.0–10.5)
nRBC: 0 % (ref 0.0–0.2)

## 2021-12-09 LAB — CMP (CANCER CENTER ONLY)
ALT: 12 U/L (ref 0–44)
AST: 16 U/L (ref 15–41)
Albumin: 4.1 g/dL (ref 3.5–5.0)
Alkaline Phosphatase: 37 U/L — ABNORMAL LOW (ref 38–126)
Anion gap: 9 (ref 5–15)
BUN: 25 mg/dL — ABNORMAL HIGH (ref 8–23)
CO2: 32 mmol/L (ref 22–32)
Calcium: 8.9 mg/dL (ref 8.9–10.3)
Chloride: 98 mmol/L (ref 98–111)
Creatinine: 0.89 mg/dL (ref 0.44–1.00)
GFR, Estimated: >60 mL/min (ref >60)
Glucose, Bld: 108 mg/dL — ABNORMAL HIGH (ref 70–99)
Potassium: 3.7 mmol/L (ref 3.5–5.1)
Sodium: 139 mmol/L (ref 135–145)
Total Bilirubin: 1 mg/dL (ref 0.3–1.2)
Total Protein: 5.9 g/dL — ABNORMAL LOW (ref 6.5–8.1)

## 2021-12-09 LAB — URIC ACID: Uric Acid, Serum: 5.2 mg/dL (ref 2.5–7.1)

## 2021-12-09 LAB — LACTATE DEHYDROGENASE: LDH: 247 U/L — ABNORMAL HIGH (ref 98–192)

## 2021-12-13 ENCOUNTER — Inpatient Hospital Stay: Payer: PPO

## 2021-12-13 ENCOUNTER — Inpatient Hospital Stay: Payer: PPO | Admitting: Oncology

## 2021-12-13 ENCOUNTER — Other Ambulatory Visit: Payer: Self-pay | Admitting: Oncology

## 2021-12-13 ENCOUNTER — Other Ambulatory Visit: Payer: Self-pay | Admitting: *Deleted

## 2021-12-13 VITALS — BP 120/90 | HR 65 | Temp 98.1°F | Resp 18 | Ht 66.0 in | Wt 200.3 lb

## 2021-12-13 DIAGNOSIS — C8514 Unspecified B-cell lymphoma, lymph nodes of axilla and upper limb: Secondary | ICD-10-CM | POA: Diagnosis not present

## 2021-12-13 DIAGNOSIS — Z5112 Encounter for antineoplastic immunotherapy: Secondary | ICD-10-CM | POA: Diagnosis not present

## 2021-12-13 LAB — CMP (CANCER CENTER ONLY)
ALT: 17 U/L (ref 0–44)
AST: 15 U/L (ref 15–41)
Albumin: 4.1 g/dL (ref 3.5–5.0)
Alkaline Phosphatase: 38 U/L (ref 38–126)
Anion gap: 9 (ref 5–15)
BUN: 21 mg/dL (ref 8–23)
CO2: 32 mmol/L (ref 22–32)
Calcium: 9.5 mg/dL (ref 8.9–10.3)
Chloride: 98 mmol/L (ref 98–111)
Creatinine: 0.88 mg/dL (ref 0.44–1.00)
GFR, Estimated: >60 mL/min (ref >60)
Glucose, Bld: 111 mg/dL — ABNORMAL HIGH (ref 70–99)
Potassium: 4.2 mmol/L (ref 3.5–5.1)
Sodium: 139 mmol/L (ref 135–145)
Total Bilirubin: 1 mg/dL (ref 0.3–1.2)
Total Protein: 5.9 g/dL — ABNORMAL LOW (ref 6.5–8.1)

## 2021-12-13 LAB — CBC WITH DIFFERENTIAL (CANCER CENTER ONLY)
Abs Immature Granulocytes: 0.17 10*3/uL — ABNORMAL HIGH (ref 0.00–0.07)
Basophils Absolute: 0 10*3/uL (ref 0.0–0.1)
Basophils Relative: 1 %
Eosinophils Absolute: 0 10*3/uL (ref 0.0–0.5)
Eosinophils Relative: 0 %
HCT: 34.7 % — ABNORMAL LOW (ref 36.0–46.0)
Hemoglobin: 11.5 g/dL — ABNORMAL LOW (ref 12.0–15.0)
Immature Granulocytes: 5 %
Lymphocytes Relative: 7 %
Lymphs Abs: 0.2 10*3/uL — ABNORMAL LOW (ref 0.7–4.0)
MCH: 30.4 pg (ref 26.0–34.0)
MCHC: 33.1 g/dL (ref 30.0–36.0)
MCV: 91.8 fL (ref 80.0–100.0)
Monocytes Absolute: 0.1 10*3/uL (ref 0.1–1.0)
Monocytes Relative: 3 %
Neutro Abs: 2.8 10*3/uL (ref 1.7–7.7)
Neutrophils Relative %: 84 %
Platelet Count: 148 10*3/uL — ABNORMAL LOW (ref 150–400)
RBC: 3.78 MIL/uL — ABNORMAL LOW (ref 3.87–5.11)
RDW: 13.2 % (ref 11.5–15.5)
WBC Count: 3.3 10*3/uL — ABNORMAL LOW (ref 4.0–10.5)
nRBC: 0 % (ref 0.0–0.2)

## 2021-12-13 LAB — URIC ACID: Uric Acid, Serum: 3.2 mg/dL (ref 2.5–7.1)

## 2021-12-13 LAB — LACTATE DEHYDROGENASE: LDH: 218 U/L — ABNORMAL HIGH (ref 98–192)

## 2021-12-13 LAB — PHOSPHORUS: Phosphorus: 3.1 mg/dL (ref 2.5–4.6)

## 2021-12-13 MED ORDER — PREDNISONE 20 MG PO TABS: 60.0000 mg | ORAL_TABLET | Freq: Every day | ORAL | 2 refills | Status: DC

## 2021-12-13 MED ORDER — HYDROCODONE-ACETAMINOPHEN 5-325 MG PO TABS: ORAL_TABLET | ORAL | 0 refills | Status: DC

## 2021-12-13 NOTE — Progress Notes (Signed)
[place] OFFICE PROGRESS NOTE   Diagnosis: Non-Hodgkin's lymphoma  INTERVAL HISTORY:   Amanda Sellers completed a cycle of chemotherapy on 12/06/2021.  She reports nausea relieved with Compazine over the past several days.  She had 1 episode of emesis and 1 episode of diarrhea.  The right breast pain has improved.  She takes hydrocodone as needed for pain.  No fever or night sweats.  Objective:  Vital signs in last 24 hours:  Blood pressure (!) 120/90, pulse 65, temperature 98.1 F (36.7 C), temperature source Oral, resp. rate 18, height _0  (1.676 m), weight 200 lb 4.8 oz (90.9 kg), SpO2 93 %.    HEENT: No thrush or ulcers Lymphatics: Firm nodal mass in the medial right axilla Resp: Lungs clear bilaterally Cardio: Regular rate and rhythm GI: No hepatosplenomegaly Vascular: Trace right greater than leg lower leg edema Musculoskeletal: Mild edema of the right breast with overlying faint diffuse erythema  Lab Results:  Lab Results  Component Value Date   WBC 3.3 (L) 12/13/2021   HGB 11.5 (L) 12/13/2021   HCT 34.7 (L) 12/13/2021   MCV 91.8 12/13/2021   PLT 148 (L) 12/13/2021   NEUTROABS 2.8 12/13/2021    CMP  Lab Results  Component Value Date   NA 139 12/09/2021   K 3.7 12/09/2021   CL 98 12/09/2021   CO2 32 12/09/2021   GLUCOSE 108 (H) 12/09/2021   BUN 25 (H) 12/09/2021   CREATININE 0.89 12/09/2021   CALCIUM 8.9 12/09/2021   PROT 5.9 (L) 12/09/2021   ALBUMIN 4.1 12/09/2021   AST 16 12/09/2021   ALT 12 12/09/2021   ALKPHOS 37 (L) 12/09/2021   BILITOT 1.0 12/09/2021   GFRNONAA >60 12/09/2021   GFRAA 50 (L) 05/29/2019    Lab Results  Component Value Date   CEA 0.8 07/23/2014   Medications: I have reviewed the patient's current medications.   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 Severe microcytic anemia, status post transfusion with packed red blood cells 07/23/2014   Normal ferritin, low transferrin saturation, "scant" bone marrow iron stores, review of peripheral blood smear consistent with iron deficiency anemia   colonoscopy and upper endoscopy 09/23/2014 3. Exertional dyspnea /orthopnea-most likely secondary to congestive heart failure   4. History of B-12 deficiency   5. Rash over the trunk and extremities 08/18/2014. Appears consistent with a drug rash. Resolved 08/28/2014. Rash 11/18/2014, likely secondary to a contrast allergy  , diagnostic contrast will be listed as an allergy 6. Colonoscopy 06/03/2008. Medium sized internal hemorrhoids.   7. Admission with atrial flutter with a rapid ventricular response 10/15/2014-status post cardioversion 10/16/2014, maintained on     apixaban Repeat cardioversion 11/30/2014   8. Right olecranon skin lesion-likely a lipoma or cyst 9. 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  Large right axillary mass 11/24/2021 CT with numerous bulky matted appearing right axillary lymph nodes, largest discretely measurable node measuring 4.6 x 4.3 cm.  Severe edema of the right breast and right chest wall.  No lymphadenopathy or metastatic disease in the abdomen or pelvis.  Bilateral mammogram and ultrasound right breast 11/29/2021-suspicious mass possibly a replaced lymph node right axilla measuring 4.3 cm.  At least 1 additional abnormal lymph node with cortical thickening  identified in the right axilla.  Diffuse right breast skin thickening and increased trabeculation.   Biopsy right axillary node 11/29/2021-high-grade B-cell lymphoma with proliferation rate approaching 100%,  29% of the cells kappa restricted CD10 positive B cells Cycle 1 CEOP-Rituxan 12/06/2021      Disposition: Amanda Sellers is now at day 8 following cycle 1 chemotherapy given for large B-cell lymphoma involving a right axillary mass.  The mass appears partially improved.  She has experienced improvement in pain.  She tolerated the chemotherapy well.  No evidence of tumor lysis syndrome.  Amanda Sellers will return for cycle 2 chemotherapy on 12/27/2021.  She will continue hydrocodone as needed for pain.  She will decrease the use of hydrocodone as tolerated.  [name], MD  12/13/2021  9:41 AM

## 2021-12-16 ENCOUNTER — Other Ambulatory Visit: Payer: Self-pay | Admitting: Family Medicine

## 2021-12-16 ENCOUNTER — Encounter: Payer: Self-pay | Admitting: Oncology

## 2021-12-19 ENCOUNTER — Other Ambulatory Visit: Payer: Self-pay | Admitting: Nurse Practitioner

## 2021-12-19 ENCOUNTER — Encounter: Payer: Self-pay | Admitting: Oncology

## 2021-12-19 ENCOUNTER — Telehealth: Payer: Self-pay

## 2021-12-19 DIAGNOSIS — C8514 Unspecified B-cell lymphoma, lymph nodes of axilla and upper limb: Secondary | ICD-10-CM

## 2021-12-19 MED ORDER — DOXYCYCLINE HYCLATE 100 MG PO TABS: 100.0000 mg | ORAL_TABLET | Freq: Two times a day (BID) | ORAL | 0 refills | Status: DC

## 2021-12-19 NOTE — Telephone Encounter (Signed)
Daughter has contacted oncology as well  [place] [facility] - Client TELEPHONE ADVICE RECORD Access[facility]urse Patient Name: Amanda Sellers [facility] Gender: Female DOB: 1932-03-21 Age: 88 Y 49 M 23 D Return Phone Number: [phone] (Primary) Address: City/ State/ Zip: [place]  [address] Client [facility] - Client Client Site [place] - Day Provider Crissie Sickles - MD Contact Type Call Who Is Calling Patient / Member / Family / Caregiver Call Type Triage / Clinical Caller Name Hali Marry Relationship To Patient Daughter Return Phone Number [phone] (Primary) Chief Complaint Leg Pain Reason for Call Symptomatic / Request for [place] states her mom has hx of cellulitis in her legs, bad til she's previously developed ulcers. She is currently in chemo, and now has sx of cellulitis back in her leg and are wondering if she can get an antibiotic called in. Additional Comment [facility] [phone] Translation No Nurse Assessment Nurse: Merlyn Lot, RN, Lilly Date/Time (Eastern Time): 12/16/2021 8:52:16 AM Confirm and document reason for call. If symptomatic, describe symptoms. ---Daughter states patient is currently on chemo for lymphoma. She states patient has a history of cellulitis in her leg. She reports symptoms of cellulitis in left leg and is requesting antibiotics. Does the patient have any new or worsening symptoms? ---Yes Will a triage be completed? ---Yes Related visit to physician within the last 2 weeks? ---N/A Does the PT have any chronic conditions? (i.e. diabetes, asthma, this includes High risk factors for pregnancy, etc.) ---Yes List chronic conditions. ---lymphoma, CHF Is this a behavioral health or substance abuse call? ---No Guidelines Guideline Title Affirmed Question Affirmed Notes Nurse Date/Time Eilene Ghazi Time) Leg Swelling and Edema [1] Red  area or streak [2] large (> 2 in. or 5 cm) Merlyn Lot, RN, Lilly 12/16/2021 9:10:48 AM PLEASE NOTE: All timestamps contained within this report are represented as [place] Standard Time. CONFIDENTIALTY NOTICE: This fax transmission is intended only for the addressee. It contains information that is legally privileged, confidential or otherwise protected from use or disclosure. If you are not the intended recipient, you are strictly prohibited from reviewing, disclosing, copying using or disseminating any of this information or taking any action in reliance on or regarding this information. If you have received this fax in error, please notify us immediately by telephone so that we can arrange for its return to [place]. Phone: [phone], Toll-Free: [phone], Fax: [phone] Page: 2 of 3 Call Id: [id number] [place]. Time Eilene Ghazi Time) Disposition Final User 12/16/2021 9:01:45 AM Attempt made - message left Merlyn Lot, RN, Lilly 12/16/2021 9:16:09 AM See HCP within 4 Hours (or PCP triage) Yes Merlyn Lot, [place], [place] 12/16/2021 9:24:36 AM Called On-Call Provider Merlyn Lot, RN, Lilly Final Disposition 12/16/2021 9:16:09 AM See HCP within 4 Hours (or PCP triage) Yes Merlyn Lot, RN, Lilly Caller Disagree/Comply Comply Caller Understands Yes PreDisposition Call Doctor Care Advice Given Per Guideline SEE HCP (OR PCP TRIAGE) WITHIN 4 HOURS: * IF OFFICE WILL BE CLOSED AND NO PCP (PRIMARY CARE PROVIDER) SECOND-LEVEL TRIAGE: You need to be seen within the next 3 or 4 hours. A nearby Urgent Care Center [facility]) is often a good source of care. Another choice is to go to the ED. Go sooner if you become worse. * ED: Patients who may need surgery or hospital admission need to be sent to an ED. So do most patients with serious symptoms or complex medical problems. CALL BACK IF: * You become worse CARE ADVICE given per  Leg Swelling and Edema (Adult) guideline. Comments User: Sharlee Blew, RN  Date/Time Eilene Ghazi Time): 12/16/2021 9:02:07 AM Caller not currently with patient. Requests I call back in 10 minutes. User: Sharlee Blew, RN Date/Time Eilene Ghazi Time): 12/16/2021 9:15:32 AM Caller states patient allergies include: aspirin, IV contrast, and calcium alginate. [facility] [place] User: Sharlee Blew, RN Date/Time Eilene Ghazi Time): 12/16/2021 9:26:01 AM I have called Christy back and given her recommendations from on call provider. She states she will reach out to oncology or take patient to urgent care. Referrals GO TO FACILITY UNDECIDED Paging DoctorName Phone DateTime Result/ Outcome Message Type Notes Lodema Pilot [phone] 12/16/2021 9:24:36 AM Called On Call Provider - Reached Doctor Paged PLEASE NOTE: All timestamps contained within this report are represented as [place] Standard Time. CONFIDENTIALTY NOTICE: This fax transmission is intended only for the addressee. It contains information that is legally privileged, confidential or otherwise protected from use or disclosure. If you are not the intended recipient, you are strictly prohibited from reviewing, disclosing, copying using or disseminating any of this information or taking any action in reliance on or regarding this information. If you have received this fax in error, please notify us immediately by telephone so that we can arrange for its return to [place]. Phone: [phone], Toll-Free: [phone], Fax: [phone] Page: 3 of 3 Call Id: [id number] Paging DoctorName Phone DateTime Result/ Outcome Message Type Notes Lodema Pilot 12/16/2021 9:25:21 AM Spoke with On Call - Outcome Notification Message Result OCP states no antibiotics over the phone, have caller reach out to oncologist or take patient to urgent care for evaluation and treatment of possible cellulitis. Will inform caller.

## 2021-12-21 ENCOUNTER — Encounter: Payer: Self-pay | Admitting: Nurse Practitioner

## 2021-12-21 ENCOUNTER — Inpatient Hospital Stay: Payer: PPO | Admitting: Nurse Practitioner

## 2021-12-21 DIAGNOSIS — C8514 Unspecified B-cell lymphoma, lymph nodes of axilla and upper limb: Secondary | ICD-10-CM

## 2021-12-21 DIAGNOSIS — Z5112 Encounter for antineoplastic immunotherapy: Secondary | ICD-10-CM | POA: Diagnosis not present

## 2021-12-21 MED ORDER — DOXYCYCLINE HYCLATE 100 MG PO TABS: 100.0000 mg | ORAL_TABLET | Freq: Two times a day (BID) | ORAL | 0 refills | Status: AC

## 2021-12-21 NOTE — Progress Notes (Signed)
[place] OFFICE PROGRESS NOTE   Diagnosis: Non-Hodgkin's lymphoma  INTERVAL HISTORY:   [name] returns prior to scheduled follow-up to evaluate possible cellulitis left leg.  Due to transportation issues today was the first day she was able to come in for a visit.  We prescribed doxycycline 2 days ago.  She reports a flare of "cellulitis" at the left lower leg about 5 days ago.  Since beginning doxycycline the redness has decreased and the leg is "less hot".  She denies fever.  She feels the right axillary mass is smaller and the right breast is less edematous.  Overall she is feeling well.  She feels stronger.  Objective:  Vital signs in last 24 hours:  Blood pressure (!) 162/56, pulse 63, temperature 98.1 F (36.7 C), temperature source Oral, resp. rate 18, height _0  (1.676 m), weight 202 lb 6.4 oz (91.8 kg), SpO2 97 %.    Lymphatics: Firm mass medial right axilla, smaller. Resp: Lungs clear bilaterally. Cardio: Regular rate and rhythm. GI: No hepatosplenomegaly. Vascular: Trace edema bilateral lower extremity.  Fading erythema left lower leg.  Chronic stasis change bilaterally. Breast: Right breast with decreased edema/erythema.  Lab Results:  Lab Results  Component Value Date   WBC 3.3 (L) 12/13/2021   HGB 11.5 (L) 12/13/2021   HCT 34.7 (L) 12/13/2021   MCV 91.8 12/13/2021   PLT 148 (L) 12/13/2021   NEUTROABS 2.8 12/13/2021    Imaging:  No results found.  Medications: I have reviewed the patient's current medications.  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 Severe microcytic anemia, status post transfusion with packed red blood cells 07/23/2014   Normal ferritin, low transferrin saturation, "scant" bone marrow iron stores, review of peripheral blood smear consistent with iron deficiency anemia   colonoscopy and upper endoscopy 09/23/2014 3. Exertional dyspnea /orthopnea-most likely secondary to congestive heart failure   4. History of B-12 deficiency   5. Rash over the trunk and extremities 08/18/2014. Appears consistent with a drug rash. Resolved 08/28/2014. Rash 11/18/2014, likely secondary to a contrast allergy  , diagnostic contrast will be listed as an allergy 6. Colonoscopy 06/03/2008. Medium sized internal hemorrhoids.   7. Admission with atrial flutter with a rapid ventricular response 10/15/2014-status post cardioversion 10/16/2014, maintained on     apixaban Repeat cardioversion 11/30/2014   8. Right olecranon skin lesion-likely a lipoma or cyst 9. 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  Large right axillary mass 11/24/2021 CT with numerous bulky matted appearing right axillary lymph nodes, largest discretely measurable node measuring 4.6 x 4.3 cm.  Severe edema of the right breast and right chest wall.  No lymphadenopathy or metastatic disease in the abdomen or pelvis.   Bilateral mammogram and ultrasound right breast 11/29/2021-suspicious mass possibly a replaced lymph node right axilla measuring 4.3 cm.  At least 1 additional abnormal lymph node with cortical thickening identified in the right axilla.  Diffuse right breast skin thickening and increased trabeculation.   Biopsy right axillary node 11/29/2021-high-grade B-cell lymphoma with proliferation rate approaching 100%,   29% of the  cells kappa restricted CD10 positive B cells Cycle 1 CEOP-Rituxan 12/06/2021  11.  Left lower extremity cellulitis 12/21/2021.  Improved since beginning doxycycline 12/19/2021.  14-day course planned.      Disposition: Amanda Sellers appears stable.  She contacted the office a few days ago with concerns regarding recurrent cellulitis left lower leg.  She was unable to come in for an office visit at that time.  We prescribed doxycycline.  She notes improvement.  On exam she appears to have fading erythema left lower leg.  She will continue doxycycline for a 14-day course.  The right axillary mass is smaller.  She will return as scheduled 12/27/2021.  We are available to see her sooner if needed.  Patient seen with Dr. Benay Spice.    Ned Card ANP/GNP-BC   12/21/2021  8:43 AM  This was a shared visit with Ned Card.  Amanda Sellers was interviewed and examined.  She is now at day 16 following cycle 1 COPD-Rituxan.  The right axillary mass is much smaller.  She appears to have cellulitis of the left lower leg.  She will complete a course of doxycycline.  I was present for greater than 50% of today's visit.  I performed medical decision making.  Julieanne Manson, MD

## 2021-12-25 ENCOUNTER — Other Ambulatory Visit: Payer: Self-pay | Admitting: Oncology

## 2021-12-25 DIAGNOSIS — I509 Heart failure, unspecified: Secondary | ICD-10-CM | POA: Diagnosis not present

## 2021-12-25 DIAGNOSIS — R609 Edema, unspecified: Secondary | ICD-10-CM | POA: Diagnosis not present

## 2021-12-27 ENCOUNTER — Inpatient Hospital Stay: Payer: PPO | Attending: Oncology

## 2021-12-27 ENCOUNTER — Encounter: Payer: Self-pay | Admitting: Family Medicine

## 2021-12-27 ENCOUNTER — Inpatient Hospital Stay (HOSPITAL_BASED_OUTPATIENT_CLINIC_OR_DEPARTMENT_OTHER): Payer: PPO | Admitting: Nurse Practitioner

## 2021-12-27 ENCOUNTER — Encounter: Payer: Self-pay | Admitting: Nurse Practitioner

## 2021-12-27 ENCOUNTER — Telehealth: Payer: Self-pay

## 2021-12-27 ENCOUNTER — Inpatient Hospital Stay: Payer: PPO

## 2021-12-27 ENCOUNTER — Other Ambulatory Visit: Payer: Self-pay | Admitting: Family Medicine

## 2021-12-27 VITALS — BP 145/52 | HR 91 | Temp 98.0°F | Resp 16 | Wt 201.0 lb

## 2021-12-27 VITALS — BP 129/52 | HR 75 | Temp 98.2°F | Resp 18

## 2021-12-27 DIAGNOSIS — Z5111 Encounter for antineoplastic chemotherapy: Secondary | ICD-10-CM | POA: Diagnosis not present

## 2021-12-27 DIAGNOSIS — C8514 Unspecified B-cell lymphoma, lymph nodes of axilla and upper limb: Secondary | ICD-10-CM

## 2021-12-27 DIAGNOSIS — C8200 Follicular lymphoma grade I, unspecified site: Secondary | ICD-10-CM | POA: Insufficient documentation

## 2021-12-27 DIAGNOSIS — Z5112 Encounter for antineoplastic immunotherapy: Secondary | ICD-10-CM | POA: Insufficient documentation

## 2021-12-27 DIAGNOSIS — L03116 Cellulitis of left lower limb: Secondary | ICD-10-CM | POA: Insufficient documentation

## 2021-12-27 DIAGNOSIS — Z79891 Long term (current) use of opiate analgesic: Secondary | ICD-10-CM | POA: Diagnosis not present

## 2021-12-27 DIAGNOSIS — G893 Neoplasm related pain (acute) (chronic): Secondary | ICD-10-CM | POA: Diagnosis not present

## 2021-12-27 LAB — CBC WITH DIFFERENTIAL (CANCER CENTER ONLY)
Abs Immature Granulocytes: 0.03 10*3/uL (ref 0.00–0.07)
Basophils Absolute: 0 10*3/uL (ref 0.0–0.1)
Basophils Relative: 1 %
Eosinophils Absolute: 0.1 10*3/uL (ref 0.0–0.5)
Eosinophils Relative: 2 %
HCT: 35.8 % — ABNORMAL LOW (ref 36.0–46.0)
Hemoglobin: 11.6 g/dL — ABNORMAL LOW (ref 12.0–15.0)
Immature Granulocytes: 1 %
Lymphocytes Relative: 13 %
Lymphs Abs: 0.4 10*3/uL — ABNORMAL LOW (ref 0.7–4.0)
MCH: 30.2 pg (ref 26.0–34.0)
MCHC: 32.4 g/dL (ref 30.0–36.0)
MCV: 93.2 fL (ref 80.0–100.0)
Monocytes Absolute: 0.3 10*3/uL (ref 0.1–1.0)
Monocytes Relative: 10 %
Neutro Abs: 2 10*3/uL (ref 1.7–7.7)
Neutrophils Relative %: 73 %
Platelet Count: 122 10*3/uL — ABNORMAL LOW (ref 150–400)
RBC: 3.84 MIL/uL — ABNORMAL LOW (ref 3.87–5.11)
RDW: 14.6 % (ref 11.5–15.5)
WBC Count: 2.8 10*3/uL — ABNORMAL LOW (ref 4.0–10.5)
nRBC: 0 % (ref 0.0–0.2)

## 2021-12-27 LAB — CMP (CANCER CENTER ONLY)
ALT: 11 U/L (ref 0–44)
AST: 18 U/L (ref 15–41)
Albumin: 4.2 g/dL (ref 3.5–5.0)
Alkaline Phosphatase: 48 U/L (ref 38–126)
Anion gap: 11 (ref 5–15)
BUN: 24 mg/dL — ABNORMAL HIGH (ref 8–23)
CO2: 30 mmol/L (ref 22–32)
Calcium: 9 mg/dL (ref 8.9–10.3)
Chloride: 101 mmol/L (ref 98–111)
Creatinine: 1.12 mg/dL — ABNORMAL HIGH (ref 0.44–1.00)
GFR, Estimated: 48 mL/min — ABNORMAL LOW (ref >60)
Glucose, Bld: 115 mg/dL — ABNORMAL HIGH (ref 70–99)
Potassium: 4 mmol/L (ref 3.5–5.1)
Sodium: 142 mmol/L (ref 135–145)
Total Bilirubin: 0.8 mg/dL (ref 0.3–1.2)
Total Protein: 6.3 g/dL — ABNORMAL LOW (ref 6.5–8.1)

## 2021-12-27 LAB — URIC ACID: Uric Acid, Serum: 6.3 mg/dL (ref 2.5–7.1)

## 2021-12-27 LAB — LACTATE DEHYDROGENASE: LDH: 314 U/L — ABNORMAL HIGH (ref 98–192)

## 2021-12-27 MED ORDER — SODIUM CHLORIDE 0.9 % IV SOLN
375.0000 mg/m2 | Freq: Once | INTRAVENOUS | Status: AC
  Administered 2021-12-27: 800 mg via INTRAVENOUS
  Filled 2021-12-27: qty 50

## 2021-12-27 MED ORDER — SODIUM CHLORIDE 0.9 % IV SOLN
50.0000 mg/m2 | Freq: Once | INTRAVENOUS | Status: AC
  Administered 2021-12-27: 100 mg via INTRAVENOUS
  Filled 2021-12-27: qty 5

## 2021-12-27 MED ORDER — SODIUM CHLORIDE 0.9 % IV SOLN
10.0000 mg | Freq: Once | INTRAVENOUS | Status: AC
  Administered 2021-12-27: 10 mg via INTRAVENOUS
  Filled 2021-12-27: qty 10

## 2021-12-27 MED ORDER — ACETAMINOPHEN 325 MG PO TABS
650.0000 mg | ORAL_TABLET | Freq: Once | ORAL | Status: AC
  Administered 2021-12-27: 650 mg via ORAL
  Filled 2021-12-27: qty 2

## 2021-12-27 MED ORDER — SODIUM CHLORIDE 0.9 % IV SOLN
600.0000 mg/m2 | Freq: Once | INTRAVENOUS | Status: AC
  Administered 2021-12-27: 1240 mg via INTRAVENOUS
  Filled 2021-12-27: qty 25

## 2021-12-27 MED ORDER — VINCRISTINE SULFATE CHEMO INJECTION 1 MG/ML
1.0000 mg | Freq: Once | INTRAVENOUS | Status: AC
  Administered 2021-12-27: 1 mg via INTRAVENOUS
  Filled 2021-12-27: qty 1

## 2021-12-27 MED ORDER — DIPHENHYDRAMINE HCL 25 MG PO CAPS
25.0000 mg | ORAL_CAPSULE | Freq: Once | ORAL | Status: AC
  Administered 2021-12-27: 25 mg via ORAL
  Filled 2021-12-27: qty 1

## 2021-12-27 MED ORDER — SODIUM CHLORIDE 0.9 % IV SOLN: Freq: Once | INTRAVENOUS | Status: AC

## 2021-12-27 MED ORDER — PALONOSETRON HCL INJECTION 0.25 MG/5ML
0.2500 mg | Freq: Once | INTRAVENOUS | Status: AC
  Administered 2021-12-27: 0.25 mg via INTRAVENOUS
  Filled 2021-12-27: qty 5

## 2021-12-27 MED ORDER — OMEPRAZOLE 20 MG PO CPDR: 20.0000 mg | DELAYED_RELEASE_CAPSULE | Freq: Every day | ORAL | 3 refills | Status: DC

## 2021-12-27 NOTE — Progress Notes (Signed)
[place] OFFICE PROGRESS NOTE   Diagnosis: Non-Hodgkin's lymphoma  INTERVAL HISTORY:   Amanda Sellers returns as scheduled.  She completed cycle 1 CEOP-Rituxan 12/06/2021.  She was seen in an unscheduled visit 12/21/2021 for left lower extremity cellulitis.  She is completing a course of doxycycline.  She reports further improvement in the left leg erythema.  She feels she tolerated the first cycle of chemotherapy well.  She denies nausea/vomiting.  No mouth sores.  No diarrhea.  No signs of allergic reaction.  No fever or sweats.  Objective:  Vital signs in last 24 hours:  Blood pressure (!) 145/52, pulse 91, temperature 98 F (36.7 C), temperature source Temporal, resp. rate 16, weight 201 lb (91.2 kg), SpO2 100 %.    HEENT: No thrush or ulcers. Lymphatics: Mass medial right axilla smaller. Resp: Lungs clear bilaterally. Cardio: Regular rate and rhythm. GI: Abdomen soft and nontender.  No hepatosplenomegaly. Vascular: Trace edema lower leg bilaterally.  Chronic stasis change bilaterally.  No significant erythema at the left lower leg. Breast: Continued improvement in right breast edema/erythema.   Lab Results:  Lab Results  Component Value Date   WBC 2.8 (L) 12/27/2021   HGB 11.6 (L) 12/27/2021   HCT 35.8 (L) 12/27/2021   MCV 93.2 12/27/2021   PLT 122 (L) 12/27/2021   NEUTROABS 2.0 12/27/2021    Imaging:  No results found.  Medications: I have reviewed the patient's current medications.  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 Severe microcytic anemia, status post transfusion with packed red blood cells 07/23/2014   Normal ferritin, low transferrin saturation, "scant" bone marrow iron stores, review of peripheral blood smear consistent with iron deficiency anemia   colonoscopy and upper endoscopy 09/23/2014 3. Exertional dyspnea /orthopnea-most likely secondary to congestive heart failure   4. History of B-12 deficiency   5. Rash over the trunk and extremities 08/18/2014. Appears consistent with a drug rash. Resolved 08/28/2014. Rash 11/18/2014, likely secondary to a contrast allergy  , diagnostic contrast will be listed as an allergy 6. Colonoscopy 06/03/2008. Medium sized internal hemorrhoids.   7. Admission with atrial flutter with a rapid ventricular response 10/15/2014-status post cardioversion 10/16/2014, maintained on     apixaban Repeat cardioversion 11/30/2014   8. Right olecranon skin lesion-likely a lipoma or cyst 9. 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  Large right axillary mass 11/24/2021 CT with numerous bulky matted appearing right axillary lymph nodes, largest discretely measurable node measuring 4.6 x 4.3 cm.  Severe edema of the right breast and right chest wall.  No lymphadenopathy or metastatic disease in the abdomen or pelvis.   Bilateral mammogram and ultrasound right breast 11/29/2021-suspicious mass possibly a replaced lymph node right axilla measuring 4.3 cm.  At least 1 additional abnormal lymph node with cortical thickening identified in the right axilla.  Diffuse right breast skin thickening and increased trabeculation.   Biopsy right axillary node 11/29/2021-high-grade B-cell lymphoma with proliferation rate approaching 100%,  29% of the cells kappa restricted CD10 positive B cells Cycle 1  CEOP-Rituxan 12/06/2021 Cycle 2 CEOP-Rituxan 12/27/2021,  [place]   11.  Left lower extremity cellulitis 12/21/2021.  Improved since beginning doxycycline 12/19/2021.  14-day course planned.    Disposition: Amanda Sellers appears stable.  She tolerated the first cycle of CEOP-Rituxan well.  The right axillary mass is smaller, right breast with less erythema/edema.  Plan to proceed with cycle 2 today as scheduled.  Review of the CBC shows ANC at the lower end of the normal range.  She will receive white cell growth factor support on the day of pump discontinuation.  We reviewed potential side effects including bone pain, rash, splenic rupture.  She agrees to proceed.  The left lower leg cellulitis appears improved.  She is completing a course of doxycycline.  She will return for lab, follow-up, cycle 3 CHOP-Rituxan in 3 weeks.  We are available to see her sooner if needed.    Ned Card ANP/GNP-BC   12/27/2021  9:15 AM

## 2021-12-27 NOTE — Patient Instructions (Addendum)
[facility]   Discharge Instructions Thank you for choosing [facility] to provide your oncology and hematology care.   If you have a lab appointment with the [facility], please go directly to the [facility] and check in at the registration area.   Wear comfortable clothing and clothing appropriate for easy access to any Portacath or PICC line.   We strive to give you quality time with your provider. You may need to reschedule your appointment if you arrive late (15 or more minutes).  Arriving late affects you and other patients whose appointments are after yours.  Also, if you miss three or more appointments without notifying the office, you may be dismissed from the clinic at the provider's discretion.      For prescription refill requests, have your pharmacy contact our office and allow 72 hours for refills to be completed.    Today you received the following chemotherapy and/or immunotherapy agents Vincristine, Cytoxan, Etoposide, Rituximab      To help prevent nausea and vomiting after your treatment, we encourage you to take your nausea medication as directed.  BELOW ARE SYMPTOMS THAT SHOULD BE REPORTED IMMEDIATELY: *FEVER GREATER THAN 100.4 F (38 C) OR HIGHER *CHILLS OR SWEATING *NAUSEA AND VOMITING THAT IS NOT CONTROLLED WITH YOUR NAUSEA MEDICATION *UNUSUAL SHORTNESS OF BREATH *UNUSUAL BRUISING OR BLEEDING *URINARY PROBLEMS (pain or burning when urinating, or frequent urination) *BOWEL PROBLEMS (unusual diarrhea, constipation, pain near the anus) TENDERNESS IN MOUTH AND THROAT WITH OR WITHOUT PRESENCE OF ULCERS (sore throat, sores in mouth, or a toothache) UNUSUAL RASH, SWELLING OR PAIN  UNUSUAL VAGINAL DISCHARGE OR ITCHING   Items with * indicate a potential emergency and should be followed up as soon as possible or go to the Emergency Department if any problems should occur.  Please show the CHEMOTHERAPY ALERT CARD or  IMMUNOTHERAPY ALERT CARD at check-in to the Emergency Department and triage nurse.  Should you have questions after your visit or need to cancel or reschedule your appointment, please contact [facility]  Dept: [phone]  and follow the prompts.  Office hours are 8:00 a.m. to 4:30 p.m. Monday - Friday. Please note that voicemails left after 4:00 p.m. may not be returned until the following business day.  We are closed weekends and major holidays. You have access to a nurse at all times for urgent questions. Please call the main number to the clinic Dept: [phone] and follow the prompts.   For any non-urgent questions, you may also contact your provider using MyChart. We now offer e-Visits for anyone 66 and older to request care online for non-urgent symptoms. For details visit [email or web address].   Also download the MyChart app! Go to the app store, search "MyChart", open the app, select [facility], and log in with your MyChart username and password.  Masks are optional in the cancer centers. If you would like for your care team to wear a mask while they are taking care of you, please let them know. You may have one support person who is at least 86 years old accompany you for your appointments.  Vincristine Injection What is this medication? VINCRISTINE (vin KRIS teen) treats some types of cancer. It works by slowing down the growth of cancer cells. This medicine may be used for other purposes; ask your health care provider or pharmacist if you have questions. COMMON BRAND NAME(S): Oncovin, Vincasar PFS What should I tell my care team  before I take this medication? They need to know if you have any of these conditions: Infection Kidney disease Liver disease Low white blood cell levels Lung disease Nervous system disease, such as Charcot-Marie-Tooth (CMT) Recent or ongoing radiation therapy An unusual or allergic reaction to vincristine, other  chemotherapy agents, other medications, foods, dyes, or preservatives Pregnant or trying to get pregnant Breast-feeding How should I use this medication? This medication is infused into a vein. It is given by your care team in a hospital or clinic setting. Talk to your care team about the use of this medication in children. While it may be given to children for selected conditions, precautions do apply. Overdosage: If you think you have taken too much of this medicine contact a poison control center or emergency room at once. NOTE: This medicine is only for you. Do not share this medicine with others. What if I miss a dose? Keep appointments for follow-up doses. It is important not to miss your dose. Call your care team if you are unable to keep an appointment. What may interact with this medication? Do not take this medication with any of the following: Live virus vaccines This medication may also interact with the following: Medications for fungal infections, such as itraconazole or fluconazole Phenytoin Supplements, such as St. John's wort This list may not describe all possible interactions. Give your health care provider a list of all the medicines, herbs, non-prescription drugs, or dietary supplements you use. Also tell them if you smoke, drink alcohol, or use illegal drugs. Some items may interact with your medicine. What should I watch for while using this medication? Your condition will be monitored carefully while you are receiving this medication. This medication may make you feel generally unwell. This is not uncommon as chemotherapy can affect healthy cells as well as cancer cells. Report any side effects. Continue your course of treatment even though you feel ill unless your care team tells you to stop. You may need blood work while taking this medication. This medication may increase your risk to bruise or bleed. Call your care team if you notice any unusual bleeding. This  medication may increase your risk of getting an infection. Call your care team for advice if you get a fever, chills, sore throat, or other symptoms of a cold or flu. Do not treat yourself. Try to avoid being around people who are sick. This medication will cause constipation. If you do not have a bowel movement for 3 days, call your care team. Call your care team if you are around anyone with measles, chickenpox, or if you develop sores or blisters that do not heal properly. Be careful brushing or flossing your teeth or using a toothpick because you may get an infection or bleed more easily. If you have any dental work done, tell your dentist you are receiving this medication Talk to your care team if you or your partner wish to become pregnant or think either of you might be pregnant. This medication can cause serious birth defects. This medication may cause infertility. Talk to your care team if you are concerned about your fertility. Talk to your care team before breastfeeding. Changes to your treatment plan may be needed. What side effects may I notice from receiving this medication? Side effects that you should report to your care team as soon as possible: Allergic reactions--skin rash, itching, hives, swelling of the face, lips, tongue, or throat High uric acid level--severe pain, redness, warmth, or swelling  in joints, pain or trouble passing urine, pain in the lower back or sides Infection--fever, chills, cough, sore throat, wounds that don't heal, pain or trouble when passing urine, general feeling of discomfort or being unwell Pain, tingling, or numbness in the hands or feet, muscle weakness, change in vision, confusion or trouble speaking, loss of balance or coordination, trouble walking, seizures Painful swelling, warmth, or redness of the skin, blisters or sores at the infusion site Shortness of breath or trouble breathing Side effects that usually do not require medical attention  (report to your care team if they continue or are bothersome): Constipation Diarrhea Hair loss Loss of appetite Nausea Stomach cramping Vomiting This list may not describe all possible side effects. Call your doctor for medical advice about side effects. You may report side effects to FDA at 1-800-FDA-1088. Where should I keep my medication? This medication is given in a hospital or clinic. It will not be stored at home. NOTE: This sheet is a summary. It may not cover all possible information. If you have questions about this medicine, talk to your doctor, pharmacist, or health care provider.  2023 Elsevier/Gold Standard (2021-04-05 00:00:00)  Cyclophosphamide Injection What is this medication? CYCLOPHOSPHAMIDE (sye kloe FOSS fa mide) treats some types of cancer. It works by slowing down the growth of cancer cells. This medicine may be used for other purposes; ask your health care provider or pharmacist if you have questions. COMMON BRAND NAME(S): Cyclophosphamide, Cytoxan, Neosar What should I tell my care team before I take this medication? They need to know if you have any of these conditions: Heart disease Irregular heartbeat or rhythm Infection Kidney problems Liver disease Low blood cell levels (white cells, platelets, or red blood cells) Lung disease Previous radiation Trouble passing urine An unusual or allergic reaction to cyclophosphamide, other medications, foods, dyes, or preservatives Pregnant or trying to get pregnant Breast-feeding How should I use this medication? This medication is injected into a vein. It is given by your care team in a hospital or clinic setting. Talk to your care team about the use of this medication in children. Special care may be needed. Overdosage: If you think you have taken too much of this medicine contact a poison control center or emergency room at once. NOTE: This medicine is only for you. Do not share this medicine with  others. What if I miss a dose? Keep appointments for follow-up doses. It is important not to miss your dose. Call your care team if you are unable to keep an appointment. What may interact with this medication? Amphotericin B Amiodarone Azathioprine Certain antivirals for HIV or hepatitis Certain medications for blood pressure, such as enalapril, lisinopril, quinapril Cyclosporine Diuretics Etanercept Indomethacin Medications that relax muscles Metronidazole Natalizumab Tamoxifen Warfarin This list may not describe all possible interactions. Give your health care provider a list of all the medicines, herbs, non-prescription drugs, or dietary supplements you use. Also tell them if you smoke, drink alcohol, or use illegal drugs. Some items may interact with your medicine. What should I watch for while using this medication? This medication may make you feel generally unwell. This is not uncommon as chemotherapy can affect healthy cells as well as cancer cells. Report any side effects. Continue your course of treatment even though you feel ill unless your care team tells you to stop. You may need blood work while you are taking this medication. This medication may increase your risk of getting an infection. Call your care team for  advice if you get a fever, chills, sore throat, or other symptoms of a cold or flu. Do not treat yourself. Try to avoid being around people who are sick. Avoid taking medications that contain aspirin, acetaminophen, ibuprofen, naproxen, or ketoprofen unless instructed by your care team. These medications may hide a fever. Be careful brushing or flossing your teeth or using a toothpick because you may get an infection or bleed more easily. If you have any dental work done, tell your dentist you are receiving this medication. Drink water or other fluids as directed. Urinate often, even at night. Some products may contain alcohol. Ask your care team if this medication  contains alcohol. Be sure to tell all care teams you are taking this medicine. Certain medicines, like metronidazole and disulfiram, can cause an unpleasant reaction when taken with alcohol. The reaction includes flushing, headache, nausea, vomiting, sweating, and increased thirst. The reaction can last from 30 minutes to several hours. Talk to your care team if you wish to become pregnant or think you might be pregnant. This medication can cause serious birth defects if taken during pregnancy and for 1 year after the last dose. A negative pregnancy test is required before starting this medication. A reliable form of contraception is recommended while taking this medication and for 1 year after the last dose. Talk to your care team about reliable forms of contraception. Do not father a child while taking this medication and for 4 months after the last dose. Use a condom during this time period. Do not breast-feed while taking this medication or for 1 week after the last dose. This medication may cause infertility. Talk to your care team if you are concerned about your fertility. Talk to your care team about your risk of cancer. You may be more at risk for certain types of cancer if you take this medication. What side effects may I notice from receiving this medication? Side effects that you should report to your care team as soon as possible: Allergic reactions--skin rash, itching, hives, swelling of the face, lips, tongue, or throat Dry cough, shortness of breath or trouble breathing Heart failure--shortness of breath, swelling of the ankles, feet, or hands, sudden weight gain, unusual weakness or fatigue Heart muscle inflammation--unusual weakness or fatigue, shortness of breath, chest pain, fast or irregular heartbeat, dizziness, swelling of the ankles, feet, or hands Heart rhythm changes--fast or irregular heartbeat, dizziness, feeling faint or lightheaded, chest pain, trouble  breathing Infection--fever, chills, cough, sore throat, wounds that don't heal, pain or trouble when passing urine, general feeling of discomfort or being unwell Kidney injury--decrease in the amount of urine, swelling of the ankles, hands, or feet Liver injury--right upper belly pain, loss of appetite, nausea, light-colored stool, dark yellow or brown urine, yellowing skin or eyes, unusual weakness or fatigue Low red blood cell level--unusual weakness or fatigue, dizziness, headache, trouble breathing Low sodium level--muscle weakness, fatigue, dizziness, headache, confusion Red or dark brown urine Unusual bruising or bleeding Side effects that usually do not require medical attention (report to your care team if they continue or are bothersome): Hair loss Irregular menstrual cycles or spotting Loss of appetite Nausea Pain, redness, or swelling with sores inside the mouth or throat Vomiting This list may not describe all possible side effects. Call your doctor for medical advice about side effects. You may report side effects to FDA at 1-800-FDA-1088. Where should I keep my medication? This medication is given in a hospital or clinic. It will not  be stored at home. NOTE: This sheet is a summary. It may not cover all possible information. If you have questions about this medicine, talk to your doctor, pharmacist, or health care provider.  2023 Elsevier/Gold Standard (2021-03-01 00:00:00)  Etoposide Injection What is this medication? ETOPOSIDE (e toe POE side) treats some types of cancer. It works by slowing down the growth of cancer cells. This medicine may be used for other purposes; ask your health care provider or pharmacist if you have questions. COMMON BRAND NAME(S): Etopophos, Toposar, VePesid What should I tell my care team before I take this medication? They need to know if you have any of these conditions: Infection Kidney disease Liver disease Low blood counts, such as low  white cell, platelet, red cell counts An unusual or allergic reaction to etoposide, other medications, foods, dyes, or preservatives If you or your partner are pregnant or trying to get pregnant Breastfeeding How should I use this medication? This medication is injected into a vein. It is given by your care team in a hospital or clinic setting. Talk to your care team about the use of this medication in children. Special care may be needed. Overdosage: If you think you have taken too much of this medicine contact a poison control center or emergency room at once. NOTE: This medicine is only for you. Do not share this medicine with others. What if I miss a dose? Keep appointments for follow-up doses. It is important not to miss your dose. Call your care team if you are unable to keep an appointment. What may interact with this medication? Warfarin This list may not describe all possible interactions. Give your health care provider a list of all the medicines, herbs, non-prescription drugs, or dietary supplements you use. Also tell them if you smoke, drink alcohol, or use illegal drugs. Some items may interact with your medicine. What should I watch for while using this medication? Your condition will be monitored carefully while you are receiving this medication. This medication may make you feel generally unwell. This is not uncommon as chemotherapy can affect healthy cells as well as cancer cells. Report any side effects. Continue your course of treatment even though you feel ill unless your care team tells you to stop. This medication can cause serious side effects. To reduce the risk, your care team may give you other medications to take before receiving this one. Be sure to follow the directions from your care team. This medication may increase your risk of getting an infection. Call your care team for advice if you get a fever, chills, sore throat, or other symptoms of a cold or flu. Do not  treat yourself. Try to avoid being around people who are sick. This medication may increase your risk to bruise or bleed. Call your care team if you notice any unusual bleeding. Talk to your care team about your risk of cancer. You may be more at risk for certain types of cancers if you take this medication. Talk to your care team if you may be pregnant. Serious birth defects can occur if you take this medication during pregnancy and for 6 months after the last dose. You will need a negative pregnancy test before starting this medication. Contraception is recommended while taking this medication and for 6 months after the last dose. Your care team can help you find the option that works for you. If your partner can get pregnant, use a condom during sex while taking this medication and  for 4 months after the last dose. Do not breastfeed while taking this medication. This medication may cause infertility. Talk to your care team if you are concerned about your fertility. What side effects may I notice from receiving this medication? Side effects that you should report to your care team as soon as possible: Allergic reactions--skin rash, itching, hives, swelling of the face, lips, tongue, or throat Infection--fever, chills, cough, sore throat, wounds that don't heal, pain or trouble when passing urine, general feeling of discomfort or being unwell Low red blood cell level--unusual weakness or fatigue, dizziness, headache, trouble breathing Unusual bruising or bleeding Side effects that usually do not require medical attention (report to your care team if they continue or are bothersome): Diarrhea Fatigue Hair loss Loss of appetite Nausea Vomiting This list may not describe all possible side effects. Call your doctor for medical advice about side effects. You may report side effects to FDA at 1-800-FDA-1088. Where should I keep my medication? This medication is given in a hospital or clinic. It will  not be stored at home. NOTE: This sheet is a summary. It may not cover all possible information. If you have questions about this medicine, talk to your doctor, pharmacist, or health care provider.  2023 Elsevier/Gold Standard (2007-03-02 00:00:00)  Rituximab Injection What is this medication? RITUXIMAB (ri TUX i mab) treats leukemia and lymphoma. It works by blocking a protein that causes cancer cells to grow and multiply. This helps to slow or stop the spread of cancer cells. It may also be used to treat autoimmune conditions, such as arthritis. It works by slowing down an overactive immune system. It is a monoclonal antibody. This medicine may be used for other purposes; ask your health care provider or pharmacist if you have questions. COMMON BRAND NAME(S): RIABNI, Rituxan, RUXIENCE, truxima What should I tell my care team before I take this medication? They need to know if you have any of these conditions: Chest pain Heart disease Immune system problems Infection, such as chickenpox, cold sores, hepatitis B, herpes Irregular heartbeat or rhythm Kidney disease Low blood counts, such as low white cells, platelets, red cells Lung disease Recent or upcoming vaccine An unusual or allergic reaction to rituximab, other medications, foods, dyes, or preservatives Pregnant or trying to get pregnant Breast-feeding How should I use this medication? This medication is injected into a vein. It is given by a care team in a hospital or clinic setting. A special MedGuide will be given to you before each treatment. Be sure to read this information carefully each time. Talk to your care team about the use of this medication in children. While this medication may be prescribed for children as young as 6 months for selected conditions, precautions do apply. Overdosage: If you think you have taken too much of this medicine contact a poison control center or emergency room at once. NOTE: This medicine is  only for you. Do not share this medicine with others. What if I miss a dose? Keep appointments for follow-up doses. It is important not to miss your dose. Call your care team if you are unable to keep an appointment. What may interact with this medication? Do not take this medication with any of the following: Live vaccines This medication may also interact with the following: Cisplatin This list may not describe all possible interactions. Give your health care provider a list of all the medicines, herbs, non-prescription drugs, or dietary supplements you use. Also tell them if you  smoke, drink alcohol, or use illegal drugs. Some items may interact with your medicine. What should I watch for while using this medication? Your condition will be monitored carefully while you are receiving this medication. You may need blood work while taking this medication. This medication can cause serious infusion reactions. To reduce the risk your care team may give you other medications to take before receiving this one. Be sure to follow the directions from your care team. This medication may increase your risk of getting an infection. Call your care team for advice if you get a fever, chills, sore throat, or other symptoms of a cold or flu. Do not treat yourself. Try to avoid being around people who are sick. Call your care team if you are around anyone with measles, chickenpox, or if you develop sores or blisters that do not heal properly. Avoid taking medications that contain aspirin, acetaminophen, ibuprofen, naproxen, or ketoprofen unless instructed by your care team. These medications may hide a fever. This medication may cause serious skin reactions. They can happen weeks to months after starting the medication. Contact your care team right away if you notice fevers or flu-like symptoms with a rash. The rash may be red or purple and then turn into blisters or peeling of the skin. You may also notice a red  rash with swelling of the face, lips, or lymph nodes in your neck or under your arms. In some patients, this medication may cause a serious brain infection that may cause death. If you have any problems seeing, thinking, speaking, walking, or standing, tell your care team right away. If you cannot reach your care team, urgently seek another source of medical care. Talk to your care team if you may be pregnant. Serious birth defects can occur if you take this medication during pregnancy and for 12 months after the last dose. You will need a negative pregnancy test before starting this medication. Contraception is recommended while taking this medication and for 12 months after the last dose. Your care team can help you find the option that works for you. Do not breastfeed while taking this medication and for at least 6 months after the last dose. What side effects may I notice from receiving this medication? Side effects that you should report to your care team as soon as possible: Allergic reactions or angioedema--skin rash, itching or hives, swelling of the face, eyes, lips, tongue, arms, or legs, trouble swallowing or breathing Bowel blockage--stomach cramping, unable to have a bowel movement or pass gas, loss of appetite, vomiting Dizziness, loss of balance or coordination, confusion or trouble speaking Heart attack--pain or tightness in the chest, shoulders, arms, or jaw, nausea, shortness of breath, cold or clammy skin, feeling faint or lightheaded Heart rhythm changes--fast or irregular heartbeat, dizziness, feeling faint or lightheaded, chest pain, trouble breathing Infection--fever, chills, cough, sore throat, wounds that don't heal, pain or trouble when passing urine, general feeling of discomfort or being unwell Infusion reactions--chest pain, shortness of breath or trouble breathing, feeling faint or lightheaded Kidney injury--decrease in the amount of urine, swelling of the ankles, hands, or  feet Liver injury--right upper belly pain, loss of appetite, nausea, light-colored stool, dark yellow or brown urine, yellowing skin or eyes, unusual weakness or fatigue Redness, blistering, peeling, or loosening of the skin, including inside the mouth Stomach pain that is severe, does not go away, or gets worse Tumor lysis syndrome (TLS)--nausea, vomiting, diarrhea, decrease in the amount of urine, dark urine, unusual  weakness or fatigue, confusion, muscle pain or cramps, fast or irregular heartbeat, joint pain Side effects that usually do not require medical attention (report to your care team if they continue or are bothersome): Headache Joint pain Nausea Runny or stuffy nose Unusual weakness or fatigue This list may not describe all possible side effects. Call your doctor for medical advice about side effects. You may report side effects to FDA at 1-800-FDA-1088. Where should I keep my medication? This medication is given in a hospital or clinic. It will not be stored at home. NOTE: This sheet is a summary. It may not cover all possible information. If you have questions about this medicine, talk to your doctor, pharmacist, or health care provider.  2023 [employer] (2021-05-24 00:00:00)

## 2021-12-27 NOTE — Telephone Encounter (Signed)
Okay, omeprazole 20 mg sent

## 2021-12-27 NOTE — Addendum Note (Signed)
Addended by: Owens Shark on: 12/27/2021 10:19 AM   Modules accepted: Orders

## 2021-12-27 NOTE — Telephone Encounter (Signed)
Patient seen by Lisa Thomas NP today  Vitals are within treatment parameters.  Labs reviewed by Lisa Thomas NP and are within treatment parameters.  Per physician team, patient is ready for treatment and there are NO modifications to the treatment plan.     

## 2021-12-28 ENCOUNTER — Inpatient Hospital Stay: Payer: PPO

## 2021-12-28 VITALS — BP 130/60 | HR 77 | Temp 98.3°F | Resp 20

## 2021-12-28 DIAGNOSIS — C8514 Unspecified B-cell lymphoma, lymph nodes of axilla and upper limb: Secondary | ICD-10-CM

## 2021-12-28 DIAGNOSIS — Z5112 Encounter for antineoplastic immunotherapy: Secondary | ICD-10-CM | POA: Diagnosis not present

## 2021-12-28 MED ORDER — PEGFILGRASTIM-CBQV 6 MG/0.6ML ~~LOC~~ SOSY
6.0000 mg | PREFILLED_SYRINGE | Freq: Once | SUBCUTANEOUS | Status: AC
  Administered 2021-12-28: 6 mg via SUBCUTANEOUS
  Filled 2021-12-28: qty 0.6

## 2021-12-28 NOTE — Patient Instructions (Signed)

## 2021-12-30 ENCOUNTER — Other Ambulatory Visit: Payer: Self-pay | Admitting: Nurse Practitioner

## 2021-12-30 DIAGNOSIS — C8514 Unspecified B-cell lymphoma, lymph nodes of axilla and upper limb: Secondary | ICD-10-CM

## 2022-01-10 ENCOUNTER — Other Ambulatory Visit: Payer: Self-pay | Admitting: Nurse Practitioner

## 2022-01-10 ENCOUNTER — Other Ambulatory Visit: Payer: Self-pay | Admitting: Oncology

## 2022-01-10 DIAGNOSIS — C8514 Unspecified B-cell lymphoma, lymph nodes of axilla and upper limb: Secondary | ICD-10-CM

## 2022-01-10 MED ORDER — HYDROCODONE-ACETAMINOPHEN 5-325 MG PO TABS: ORAL_TABLET | ORAL | 0 refills | Status: DC

## 2022-01-11 ENCOUNTER — Ambulatory Visit (HOSPITAL_COMMUNITY): Payer: PPO | Admitting: Nurse Practitioner

## 2022-01-16 ENCOUNTER — Other Ambulatory Visit: Payer: Self-pay | Admitting: Oncology

## 2022-01-16 DIAGNOSIS — C8514 Unspecified B-cell lymphoma, lymph nodes of axilla and upper limb: Secondary | ICD-10-CM

## 2022-01-18 ENCOUNTER — Inpatient Hospital Stay: Payer: PPO

## 2022-01-18 ENCOUNTER — Other Ambulatory Visit: Payer: Self-pay | Admitting: Nurse Practitioner

## 2022-01-18 ENCOUNTER — Inpatient Hospital Stay (HOSPITAL_BASED_OUTPATIENT_CLINIC_OR_DEPARTMENT_OTHER): Payer: PPO | Admitting: Oncology

## 2022-01-18 ENCOUNTER — Ambulatory Visit: Payer: PPO

## 2022-01-18 VITALS — BP 129/49 | HR 70 | Temp 97.9°F | Resp 18 | Ht 66.0 in | Wt 200.1 lb

## 2022-01-18 VITALS — BP 139/80 | HR 76 | Temp 98.1°F | Resp 17

## 2022-01-18 DIAGNOSIS — C8514 Unspecified B-cell lymphoma, lymph nodes of axilla and upper limb: Secondary | ICD-10-CM

## 2022-01-18 DIAGNOSIS — Z5112 Encounter for antineoplastic immunotherapy: Secondary | ICD-10-CM | POA: Diagnosis not present

## 2022-01-18 LAB — CBC WITH DIFFERENTIAL (CANCER CENTER ONLY)
Abs Immature Granulocytes: 0.03 10*3/uL (ref 0.00–0.07)
Basophils Absolute: 0 10*3/uL (ref 0.0–0.1)
Basophils Relative: 1 %
Eosinophils Absolute: 0.1 10*3/uL (ref 0.0–0.5)
Eosinophils Relative: 2 %
HCT: 31.5 % — ABNORMAL LOW (ref 36.0–46.0)
Hemoglobin: 9.8 g/dL — ABNORMAL LOW (ref 12.0–15.0)
Immature Granulocytes: 1 %
Lymphocytes Relative: 20 %
Lymphs Abs: 0.5 10*3/uL — ABNORMAL LOW (ref 0.7–4.0)
MCH: 30.2 pg (ref 26.0–34.0)
MCHC: 31.1 g/dL (ref 30.0–36.0)
MCV: 96.9 fL (ref 80.0–100.0)
Monocytes Absolute: 0.4 10*3/uL (ref 0.1–1.0)
Monocytes Relative: 14 %
Neutro Abs: 1.7 10*3/uL (ref 1.7–7.7)
Neutrophils Relative %: 62 %
Platelet Count: 130 10*3/uL — ABNORMAL LOW (ref 150–400)
RBC: 3.25 MIL/uL — ABNORMAL LOW (ref 3.87–5.11)
RDW: 16.9 % — ABNORMAL HIGH (ref 11.5–15.5)
WBC Count: 2.7 10*3/uL — ABNORMAL LOW (ref 4.0–10.5)
nRBC: 0 % (ref 0.0–0.2)

## 2022-01-18 LAB — CMP (CANCER CENTER ONLY)
ALT: 12 U/L (ref 0–44)
AST: 19 U/L (ref 15–41)
Albumin: 4.2 g/dL (ref 3.5–5.0)
Alkaline Phosphatase: 44 U/L (ref 38–126)
Anion gap: 10 (ref 5–15)
BUN: 25 mg/dL — ABNORMAL HIGH (ref 8–23)
CO2: 31 mmol/L (ref 22–32)
Calcium: 8.9 mg/dL (ref 8.9–10.3)
Chloride: 102 mmol/L (ref 98–111)
Creatinine: 1.13 mg/dL — ABNORMAL HIGH (ref 0.44–1.00)
GFR, Estimated: 47 mL/min — ABNORMAL LOW (ref >60)
Glucose, Bld: 128 mg/dL — ABNORMAL HIGH (ref 70–99)
Potassium: 4.1 mmol/L (ref 3.5–5.1)
Sodium: 143 mmol/L (ref 135–145)
Total Bilirubin: 0.6 mg/dL (ref 0.3–1.2)
Total Protein: 6 g/dL — ABNORMAL LOW (ref 6.5–8.1)

## 2022-01-18 LAB — URIC ACID: Uric Acid, Serum: 6.2 mg/dL (ref 2.5–7.1)

## 2022-01-18 LAB — LACTATE DEHYDROGENASE: LDH: 286 U/L — ABNORMAL HIGH (ref 98–192)

## 2022-01-18 MED ORDER — SODIUM CHLORIDE 0.9 % IV SOLN
600.0000 mg/m2 | Freq: Once | INTRAVENOUS | Status: AC
  Administered 2022-01-18: 1240 mg via INTRAVENOUS
  Filled 2022-01-18: qty 50

## 2022-01-18 MED ORDER — HYDROCODONE-ACETAMINOPHEN 5-325 MG PO TABS: ORAL_TABLET | ORAL | 0 refills | Status: DC

## 2022-01-18 MED ORDER — SODIUM CHLORIDE 0.9 % IV SOLN
10.0000 mg | Freq: Once | INTRAVENOUS | Status: AC
  Administered 2022-01-18: 10 mg via INTRAVENOUS
  Filled 2022-01-18: qty 1

## 2022-01-18 MED ORDER — DIPHENHYDRAMINE HCL 25 MG PO CAPS
25.0000 mg | ORAL_CAPSULE | Freq: Once | ORAL | Status: AC
  Administered 2022-01-18: 25 mg via ORAL
  Filled 2022-01-18: qty 1

## 2022-01-18 MED ORDER — SODIUM CHLORIDE 0.9 % IV SOLN: Freq: Once | INTRAVENOUS | Status: AC

## 2022-01-18 MED ORDER — ACETAMINOPHEN 325 MG PO TABS
650.0000 mg | ORAL_TABLET | Freq: Once | ORAL | Status: AC
  Administered 2022-01-18: 650 mg via ORAL
  Filled 2022-01-18: qty 2

## 2022-01-18 MED ORDER — SODIUM CHLORIDE 0.9 % IV SOLN
375.0000 mg/m2 | Freq: Once | INTRAVENOUS | Status: AC
  Administered 2022-01-18: 800 mg via INTRAVENOUS
  Filled 2022-01-18: qty 50

## 2022-01-18 MED ORDER — SODIUM CHLORIDE 0.9 % IV SOLN
50.0000 mg/m2 | Freq: Once | INTRAVENOUS | Status: AC
  Administered 2022-01-18: 100 mg via INTRAVENOUS
  Filled 2022-01-18: qty 5

## 2022-01-18 MED ORDER — VINCRISTINE SULFATE CHEMO INJECTION 1 MG/ML
1.0000 mg | Freq: Once | INTRAVENOUS | Status: AC
  Administered 2022-01-18: 1 mg via INTRAVENOUS
  Filled 2022-01-18: qty 1

## 2022-01-18 MED ORDER — PALONOSETRON HCL INJECTION 0.25 MG/5ML
0.2500 mg | Freq: Once | INTRAVENOUS | Status: AC
  Administered 2022-01-18: 0.25 mg via INTRAVENOUS
  Filled 2022-01-18: qty 5

## 2022-01-18 NOTE — Patient Instructions (Signed)
[place]   Discharge Instructions: Thank you for choosing [facility] to provide your oncology and hematology care.   If you have a lab appointment with the [facility], please go directly to the [facility] and check in at the registration area.   Wear comfortable clothing and clothing appropriate for easy access to any Portacath or PICC line.   We strive to give you quality time with your provider. You may need to reschedule your appointment if you arrive late (15 or more minutes).  Arriving late affects you and other patients whose appointments are after yours.  Also, if you miss three or more appointments without notifying the office, you may be dismissed from the clinic at the provider's discretion.      For prescription refill requests, have your pharmacy contact our office and allow 72 hours for refills to be completed.    Today you received the following chemotherapy and/or immunotherapy agents Vincristine,Cytoxan,  Etoposide, Rituximab.      To help prevent nausea and vomiting after your treatment, we encourage you to take your nausea medication as directed.  BELOW ARE SYMPTOMS THAT SHOULD BE REPORTED IMMEDIATELY: *FEVER GREATER THAN 100.4 F (38 C) OR HIGHER *CHILLS OR SWEATING *NAUSEA AND VOMITING THAT IS NOT CONTROLLED WITH YOUR NAUSEA MEDICATION *UNUSUAL SHORTNESS OF BREATH *UNUSUAL BRUISING OR BLEEDING *URINARY PROBLEMS (pain or burning when urinating, or frequent urination) *BOWEL PROBLEMS (unusual diarrhea, constipation, pain near the anus) TENDERNESS IN MOUTH AND THROAT WITH OR WITHOUT PRESENCE OF ULCERS (sore throat, sores in mouth, or a toothache) UNUSUAL RASH, SWELLING OR PAIN  UNUSUAL VAGINAL DISCHARGE OR ITCHING   Items with * indicate a potential emergency and should be followed up as soon as possible or go to the Emergency Department if any problems should occur.  Please show the CHEMOTHERAPY ALERT CARD or  IMMUNOTHERAPY ALERT CARD at check-in to the Emergency Department and triage nurse.  Should you have questions after your visit or need to cancel or reschedule your appointment, please contact [facility]  Dept: [phone]  and follow the prompts.  Office hours are 8:00 a.m. to 4:30 p.m. Monday - Friday. Please note that voicemails left after 4:00 p.m. may not be returned until the following business day.  We are closed weekends and major holidays. You have access to a nurse at all times for urgent questions. Please call the main number to the clinic Dept: [phone] and follow the prompts.   For any non-urgent questions, you may also contact your provider using MyChart. We now offer e-Visits for anyone 38 and older to request care online for non-urgent symptoms. For details visit [email or web address].   Also download the MyChart app! Go to the app store, search "MyChart", open the app, select [facility], and log in with your MyChart username and password.  Vincristine Injection What is this medication? VINCRISTINE (vin KRIS teen) treats some types of cancer. It works by slowing down the growth of cancer cells. This medicine may be used for other purposes; ask your health care provider or pharmacist if you have questions. COMMON BRAND NAME(S): Oncovin, Vincasar PFS What should I tell my care team before I take this medication? They need to know if you have any of these conditions: Infection Kidney disease Liver disease Low white blood cell levels Lung disease Nervous system disease, such as Charcot-Marie-Tooth (CMT) Recent or ongoing radiation therapy An unusual or allergic reaction to vincristine, other chemotherapy  agents, other medications, foods, dyes, or preservatives Pregnant or trying to get pregnant Breast-feeding How should I use this medication? This medication is infused into a vein. It is given by your care team in a hospital or clinic  setting. Talk to your care team about the use of this medication in children. While it may be given to children for selected conditions, precautions do apply. Overdosage: If you think you have taken too much of this medicine contact a poison control center or emergency room at once. NOTE: This medicine is only for you. Do not share this medicine with others. What if I miss a dose? Keep appointments for follow-up doses. It is important not to miss your dose. Call your care team if you are unable to keep an appointment. What may interact with this medication? Do not take this medication with any of the following: Live virus vaccines This medication may also interact with the following: Medications for fungal infections, such as itraconazole or fluconazole Phenytoin Supplements, such as St. John's wort This list may not describe all possible interactions. Give your health care provider a list of all the medicines, herbs, non-prescription drugs, or dietary supplements you use. Also tell them if you smoke, drink alcohol, or use illegal drugs. Some items may interact with your medicine. What should I watch for while using this medication? Your condition will be monitored carefully while you are receiving this medication. This medication may make you feel generally unwell. This is not uncommon as chemotherapy can affect healthy cells as well as cancer cells. Report any side effects. Continue your course of treatment even though you feel ill unless your care team tells you to stop. You may need blood work while taking this medication. This medication may increase your risk to bruise or bleed. Call your care team if you notice any unusual bleeding. This medication may increase your risk of getting an infection. Call your care team for advice if you get a fever, chills, sore throat, or other symptoms of a cold or flu. Do not treat yourself. Try to avoid being around people who are sick. This medication  will cause constipation. If you do not have a bowel movement for 3 days, call your care team. Call your care team if you are around anyone with measles, chickenpox, or if you develop sores or blisters that do not heal properly. Be careful brushing or flossing your teeth or using a toothpick because you may get an infection or bleed more easily. If you have any dental work done, tell your dentist you are receiving this medication Talk to your care team if you or your partner wish to become pregnant or think either of you might be pregnant. This medication can cause serious birth defects. This medication may cause infertility. Talk to your care team if you are concerned about your fertility. Talk to your care team before breastfeeding. Changes to your treatment plan may be needed. What side effects may I notice from receiving this medication? Side effects that you should report to your care team as soon as possible: Allergic reactions--skin rash, itching, hives, swelling of the face, lips, tongue, or throat High uric acid level--severe pain, redness, warmth, or swelling in joints, pain or trouble passing urine, pain in the lower back or sides Infection--fever, chills, cough, sore throat, wounds that don't heal, pain or trouble when passing urine, general feeling of discomfort or being unwell Pain, tingling, or numbness in the hands or feet, muscle weakness, change in  vision, confusion or trouble speaking, loss of balance or coordination, trouble walking, seizures Painful swelling, warmth, or redness of the skin, blisters or sores at the infusion site Shortness of breath or trouble breathing Side effects that usually do not require medical attention (report to your care team if they continue or are bothersome): Constipation Diarrhea Hair loss Loss of appetite Nausea Stomach cramping Vomiting This list may not describe all possible side effects. Call your doctor for medical advice about side  effects. You may report side effects to FDA at 1-800-FDA-1088. Where should I keep my medication? This medication is given in a hospital or clinic. It will not be stored at home. NOTE: This sheet is a summary. It may not cover all possible information. If you have questions about this medicine, talk to your doctor, pharmacist, or health care provider.  2023 Elsevier/Gold Standard (2021-04-05 00:00:00) Cyclophosphamide Injection What is this medication? CYCLOPHOSPHAMIDE (sye kloe FOSS fa mide) treats some types of cancer. It works by slowing down the growth of cancer cells. This medicine may be used for other purposes; ask your health care provider or pharmacist if you have questions. COMMON BRAND NAME(S): Cyclophosphamide, Cytoxan, Neosar What should I tell my care team before I take this medication? They need to know if you have any of these conditions: Heart disease Irregular heartbeat or rhythm Infection Kidney problems Liver disease Low blood cell levels (white cells, platelets, or red blood cells) Lung disease Previous radiation Trouble passing urine An unusual or allergic reaction to cyclophosphamide, other medications, foods, dyes, or preservatives Pregnant or trying to get pregnant Breast-feeding How should I use this medication? This medication is injected into a vein. It is given by your care team in a hospital or clinic setting. Talk to your care team about the use of this medication in children. Special care may be needed. Overdosage: If you think you have taken too much of this medicine contact a poison control center or emergency room at once. NOTE: This medicine is only for you. Do not share this medicine with others. What if I miss a dose? Keep appointments for follow-up doses. It is important not to miss your dose. Call your care team if you are unable to keep an appointment. What may interact with this medication? Amphotericin B Amiodarone Azathioprine Certain  antivirals for HIV or hepatitis Certain medications for blood pressure, such as enalapril, lisinopril, quinapril Cyclosporine Diuretics Etanercept Indomethacin Medications that relax muscles Metronidazole Natalizumab Tamoxifen Warfarin This list may not describe all possible interactions. Give your health care provider a list of all the medicines, herbs, non-prescription drugs, or dietary supplements you use. Also tell them if you smoke, drink alcohol, or use illegal drugs. Some items may interact with your medicine. What should I watch for while using this medication? This medication may make you feel generally unwell. This is not uncommon as chemotherapy can affect healthy cells as well as cancer cells. Report any side effects. Continue your course of treatment even though you feel ill unless your care team tells you to stop. You may need blood work while you are taking this medication. This medication may increase your risk of getting an infection. Call your care team for advice if you get a fever, chills, sore throat, or other symptoms of a cold or flu. Do not treat yourself. Try to avoid being around people who are sick. Avoid taking medications that contain aspirin, acetaminophen, ibuprofen, naproxen, or ketoprofen unless instructed by your care team. These medications may  hide a fever. Be careful brushing or flossing your teeth or using a toothpick because you may get an infection or bleed more easily. If you have any dental work done, tell your dentist you are receiving this medication. Drink water or other fluids as directed. Urinate often, even at night. Some products may contain alcohol. Ask your care team if this medication contains alcohol. Be sure to tell all care teams you are taking this medicine. Certain medicines, like metronidazole and disulfiram, can cause an unpleasant reaction when taken with alcohol. The reaction includes flushing, headache, nausea, vomiting, sweating, and  increased thirst. The reaction can last from 30 minutes to several hours. Talk to your care team if you wish to become pregnant or think you might be pregnant. This medication can cause serious birth defects if taken during pregnancy and for 1 year after the last dose. A negative pregnancy test is required before starting this medication. A reliable form of contraception is recommended while taking this medication and for 1 year after the last dose. Talk to your care team about reliable forms of contraception. Do not father a child while taking this medication and for 4 months after the last dose. Use a condom during this time period. Do not breast-feed while taking this medication or for 1 week after the last dose. This medication may cause infertility. Talk to your care team if you are concerned about your fertility. Talk to your care team about your risk of cancer. You may be more at risk for certain types of cancer if you take this medication. What side effects may I notice from receiving this medication? Side effects that you should report to your care team as soon as possible: Allergic reactions--skin rash, itching, hives, swelling of the face, lips, tongue, or throat Dry cough, shortness of breath or trouble breathing Heart failure--shortness of breath, swelling of the ankles, feet, or hands, sudden weight gain, unusual weakness or fatigue Heart muscle inflammation--unusual weakness or fatigue, shortness of breath, chest pain, fast or irregular heartbeat, dizziness, swelling of the ankles, feet, or hands Heart rhythm changes--fast or irregular heartbeat, dizziness, feeling faint or lightheaded, chest pain, trouble breathing Infection--fever, chills, cough, sore throat, wounds that don't heal, pain or trouble when passing urine, general feeling of discomfort or being unwell Kidney injury--decrease in the amount of urine, swelling of the ankles, hands, or feet Liver injury--right upper belly pain,  loss of appetite, nausea, light-colored stool, dark yellow or brown urine, yellowing skin or eyes, unusual weakness or fatigue Low red blood cell level--unusual weakness or fatigue, dizziness, headache, trouble breathing Low sodium level--muscle weakness, fatigue, dizziness, headache, confusion Red or dark brown urine Unusual bruising or bleeding Side effects that usually do not require medical attention (report to your care team if they continue or are bothersome): Hair loss Irregular menstrual cycles or spotting Loss of appetite Nausea Pain, redness, or swelling with sores inside the mouth or throat Vomiting This list may not describe all possible side effects. Call your doctor for medical advice about side effects. You may report side effects to FDA at 1-800-FDA-1088. Where should I keep my medication? This medication is given in a hospital or clinic. It will not be stored at home. NOTE: This sheet is a summary. It may not cover all possible information. If you have questions about this medicine, talk to your doctor, pharmacist, or health care provider.  2023 Elsevier/Gold Standard (2021-03-01 00:00:00) Etoposide Injection What is this medication? ETOPOSIDE (e toe POE side)  treats some types of cancer. It works by slowing down the growth of cancer cells. This medicine may be used for other purposes; ask your health care provider or pharmacist if you have questions. COMMON BRAND NAME(S): Etopophos, Toposar, VePesid What should I tell my care team before I take this medication? They need to know if you have any of these conditions: Infection Kidney disease Liver disease Low blood counts, such as low white cell, platelet, red cell counts An unusual or allergic reaction to etoposide, other medications, foods, dyes, or preservatives If you or your partner are pregnant or trying to get pregnant Breastfeeding How should I use this medication? This medication is injected into a vein. It  is given by your care team in a hospital or clinic setting. Talk to your care team about the use of this medication in children. Special care may be needed. Overdosage: If you think you have taken too much of this medicine contact a poison control center or emergency room at once. NOTE: This medicine is only for you. Do not share this medicine with others. What if I miss a dose? Keep appointments for follow-up doses. It is important not to miss your dose. Call your care team if you are unable to keep an appointment. What may interact with this medication? Warfarin This list may not describe all possible interactions. Give your health care provider a list of all the medicines, herbs, non-prescription drugs, or dietary supplements you use. Also tell them if you smoke, drink alcohol, or use illegal drugs. Some items may interact with your medicine. What should I watch for while using this medication? Your condition will be monitored carefully while you are receiving this medication. This medication may make you feel generally unwell. This is not uncommon as chemotherapy can affect healthy cells as well as cancer cells. Report any side effects. Continue your course of treatment even though you feel ill unless your care team tells you to stop. This medication can cause serious side effects. To reduce the risk, your care team may give you other medications to take before receiving this one. Be sure to follow the directions from your care team. This medication may increase your risk of getting an infection. Call your care team for advice if you get a fever, chills, sore throat, or other symptoms of a cold or flu. Do not treat yourself. Try to avoid being around people who are sick. This medication may increase your risk to bruise or bleed. Call your care team if you notice any unusual bleeding. Talk to your care team about your risk of cancer. You may be more at risk for certain types of cancers if you take  this medication. Talk to your care team if you may be pregnant. Serious birth defects can occur if you take this medication during pregnancy and for 6 months after the last dose. You will need a negative pregnancy test before starting this medication. Contraception is recommended while taking this medication and for 6 months after the last dose. Your care team can help you find the option that works for you. If your partner can get pregnant, use a condom during sex while taking this medication and for 4 months after the last dose. Do not breastfeed while taking this medication. This medication may cause infertility. Talk to your care team if you are concerned about your fertility. What side effects may I notice from receiving this medication? Side effects that you should report to your care team  as soon as possible: Allergic reactions--skin rash, itching, hives, swelling of the face, lips, tongue, or throat Infection--fever, chills, cough, sore throat, wounds that don't heal, pain or trouble when passing urine, general feeling of discomfort or being unwell Low red blood cell level--unusual weakness or fatigue, dizziness, headache, trouble breathing Unusual bruising or bleeding Side effects that usually do not require medical attention (report to your care team if they continue or are bothersome): Diarrhea Fatigue Hair loss Loss of appetite Nausea Vomiting This list may not describe all possible side effects. Call your doctor for medical advice about side effects. You may report side effects to FDA at 1-800-FDA-1088. Where should I keep my medication? This medication is given in a hospital or clinic. It will not be stored at home. NOTE: This sheet is a summary. It may not cover all possible information. If you have questions about this medicine, talk to your doctor, pharmacist, or health care provider.  2023 Elsevier/Gold Standard (2007-03-02 00:00:00) Rituximab Injection What is this  medication? RITUXIMAB (ri TUX i mab) treats leukemia and lymphoma. It works by blocking a protein that causes cancer cells to grow and multiply. This helps to slow or stop the spread of cancer cells. It may also be used to treat autoimmune conditions, such as arthritis. It works by slowing down an overactive immune system. It is a monoclonal antibody. This medicine may be used for other purposes; ask your health care provider or pharmacist if you have questions. COMMON BRAND NAME(S): RIABNI, Rituxan, RUXIENCE, truxima What should I tell my care team before I take this medication? They need to know if you have any of these conditions: Chest pain Heart disease Immune system problems Infection, such as chickenpox, cold sores, hepatitis B, herpes Irregular heartbeat or rhythm Kidney disease Low blood counts, such as low white cells, platelets, red cells Lung disease Recent or upcoming vaccine An unusual or allergic reaction to rituximab, other medications, foods, dyes, or preservatives Pregnant or trying to get pregnant Breast-feeding How should I use this medication? This medication is injected into a vein. It is given by a care team in a hospital or clinic setting. A special MedGuide will be given to you before each treatment. Be sure to read this information carefully each time. Talk to your care team about the use of this medication in children. While this medication may be prescribed for children as young as 6 months for selected conditions, precautions do apply. Overdosage: If you think you have taken too much of this medicine contact a poison control center or emergency room at once. NOTE: This medicine is only for you. Do not share this medicine with others. What if I miss a dose? Keep appointments for follow-up doses. It is important not to miss your dose. Call your care team if you are unable to keep an appointment. What may interact with this medication? Do not take this medication  with any of the following: Live vaccines This medication may also interact with the following: Cisplatin This list may not describe all possible interactions. Give your health care provider a list of all the medicines, herbs, non-prescription drugs, or dietary supplements you use. Also tell them if you smoke, drink alcohol, or use illegal drugs. Some items may interact with your medicine. What should I watch for while using this medication? Your condition will be monitored carefully while you are receiving this medication. You may need blood work while taking this medication. This medication can cause serious infusion reactions. To  reduce the risk your care team may give you other medications to take before receiving this one. Be sure to follow the directions from your care team. This medication may increase your risk of getting an infection. Call your care team for advice if you get a fever, chills, sore throat, or other symptoms of a cold or flu. Do not treat yourself. Try to avoid being around people who are sick. Call your care team if you are around anyone with measles, chickenpox, or if you develop sores or blisters that do not heal properly. Avoid taking medications that contain aspirin, acetaminophen, ibuprofen, naproxen, or ketoprofen unless instructed by your care team. These medications may hide a fever. This medication may cause serious skin reactions. They can happen weeks to months after starting the medication. Contact your care team right away if you notice fevers or flu-like symptoms with a rash. The rash may be red or purple and then turn into blisters or peeling of the skin. You may also notice a red rash with swelling of the face, lips, or lymph nodes in your neck or under your arms. In some patients, this medication may cause a serious brain infection that may cause death. If you have any problems seeing, thinking, speaking, walking, or standing, tell your care team right away. If  you cannot reach your care team, urgently seek another source of medical care. Talk to your care team if you may be pregnant. Serious birth defects can occur if you take this medication during pregnancy and for 12 months after the last dose. You will need a negative pregnancy test before starting this medication. Contraception is recommended while taking this medication and for 12 months after the last dose. Your care team can help you find the option that works for you. Do not breastfeed while taking this medication and for at least 6 months after the last dose. What side effects may I notice from receiving this medication? Side effects that you should report to your care team as soon as possible: Allergic reactions or angioedema--skin rash, itching or hives, swelling of the face, eyes, lips, tongue, arms, or legs, trouble swallowing or breathing Bowel blockage--stomach cramping, unable to have a bowel movement or pass gas, loss of appetite, vomiting Dizziness, loss of balance or coordination, confusion or trouble speaking Heart attack--pain or tightness in the chest, shoulders, arms, or jaw, nausea, shortness of breath, cold or clammy skin, feeling faint or lightheaded Heart rhythm changes--fast or irregular heartbeat, dizziness, feeling faint or lightheaded, chest pain, trouble breathing Infection--fever, chills, cough, sore throat, wounds that don't heal, pain or trouble when passing urine, general feeling of discomfort or being unwell Infusion reactions--chest pain, shortness of breath or trouble breathing, feeling faint or lightheaded Kidney injury--decrease in the amount of urine, swelling of the ankles, hands, or feet Liver injury--right upper belly pain, loss of appetite, nausea, light-colored stool, dark yellow or brown urine, yellowing skin or eyes, unusual weakness or fatigue Redness, blistering, peeling, or loosening of the skin, including inside the mouth Stomach pain that is severe,  does not go away, or gets worse Tumor lysis syndrome (TLS)--nausea, vomiting, diarrhea, decrease in the amount of urine, dark urine, unusual weakness or fatigue, confusion, muscle pain or cramps, fast or irregular heartbeat, joint pain Side effects that usually do not require medical attention (report to your care team if they continue or are bothersome): Headache Joint pain Nausea Runny or stuffy nose Unusual weakness or fatigue This list may not describe all  possible side effects. Call your doctor for medical advice about side effects. You may report side effects to FDA at 1-800-FDA-1088. Where should I keep my medication? This medication is given in a hospital or clinic. It will not be stored at home. NOTE: This sheet is a summary. It may not cover all possible information. If you have questions about this medicine, talk to your doctor, pharmacist, or health care provider.  2023 [employer]/Gold Standard (2021-05-24 00:00:00)

## 2022-01-18 NOTE — Progress Notes (Signed)
Vincristine give piv. Good blood return before and after infusion. Given over 20 minutes with Normal Saline at 200

## 2022-01-18 NOTE — Progress Notes (Signed)
Patient seen by Dr. Sherrill today ? ?Vitals are within treatment parameters. ? ?Labs reviewed by Dr. Sherrill and are within treatment parameters. ? ?Per physician team, patient is ready for treatment and there are NO modifications to the treatment plan.  ?

## 2022-01-18 NOTE — Progress Notes (Signed)
[facility] OFFICE PROGRESS NOTE   Diagnosis: Non-Hodgkin's lymphoma  INTERVAL HISTORY:   [name] completed another cycle of chemotherapy on 06/11/2021.  She reports mild nausea following chemotherapy.  The nausea was relieved with Compazine.  No change in baseline numbness in the right hand.  No rash.  No palpable mass in the right axilla.  She developed increased swelling in the lower legs on the right greater than left.  This improved with furosemide.  She has a few ecchymoses and an area of superficial skin breakdown at the right lower leg.  The left leg cellulitis resolved. She has chronic pain in the hip and knees.  She takes hydrocodone 4 times per day. Objective:  Vital signs in last 24 hours:  Blood pressure (!) 129/49, pulse 70, temperature 97.9 F (36.6 C), temperature source Oral, resp. rate 18, height _0  (1.676 m), weight 200 lb 1.6 oz (90.8 kg), SpO2 98 %.    HEENT: No thrush or ulcers Lymphatics: No right axillary mass Resp: Lungs clear bilaterally Cardio: Regular rate and rhythm Vascular: 1+ pitting edema of the lower leg bilaterally, superficial abrasion at the right lower leg, 2-3 mm opening at the medial right lower leg with expression of old blood.  No surrounding erythema Breast: Faint erythema over the right breast.  No significant edema  Lab Results:  Lab Results  Component Value Date   WBC 2.7 (L) 01/18/2022   HGB 9.8 (L) 01/18/2022   HCT 31.5 (L) 01/18/2022   MCV 96.9 01/18/2022   PLT 130 (L) 01/18/2022   NEUTROABS 1.7 01/18/2022    CMP  Lab Results  Component Value Date   NA 143 01/18/2022   K 4.1 01/18/2022   CL 102 01/18/2022   CO2 31 01/18/2022   GLUCOSE 128 (H) 01/18/2022   BUN 25 (H) 01/18/2022   CREATININE 1.13 (H) 01/18/2022   CALCIUM 8.9 01/18/2022   PROT 6.0 (L) 01/18/2022   ALBUMIN 4.2 01/18/2022   AST 19 01/18/2022   ALT 12 01/18/2022   ALKPHOS 44 01/18/2022   BILITOT 0.6 01/18/2022   GFRNONAA 47 (L)  01/18/2022   GFRAA 50 (L) 05/29/2019    Lab Results  Component Value Date   CEA 0.8 07/23/2014     Medications: I have reviewed the patient's current medications.   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 Severe microcytic anemia, status post transfusion with packed red blood cells 07/23/2014   Normal ferritin, low transferrin saturation, "scant" bone marrow iron stores, review of peripheral blood smear consistent with iron deficiency anemia   colonoscopy and upper endoscopy 09/23/2014 3. Exertional dyspnea /orthopnea-most likely secondary to congestive heart failure   4. History of B-12 deficiency   5. Rash over the trunk and extremities 08/18/2014. Appears consistent with a drug rash. Resolved 08/28/2014. Rash 11/18/2014, likely secondary to a contrast allergy  , diagnostic contrast will be listed as an allergy 6. Colonoscopy 06/03/2008. Medium sized internal hemorrhoids.   7. Admission with atrial flutter with a rapid ventricular response 10/15/2014-status post cardioversion 10/16/2014, maintained on     apixaban Repeat cardioversion  11/30/2014   8. Right olecranon skin lesion-likely a lipoma or cyst 9. 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  Large right axillary mass 11/24/2021 CT with numerous bulky matted appearing right axillary lymph nodes, largest  discretely measurable node measuring 4.6 x 4.3 cm.  Severe edema of the right breast and right chest wall.  No lymphadenopathy or metastatic disease in the abdomen or pelvis.   Bilateral mammogram and ultrasound right breast 11/29/2021-suspicious mass possibly a replaced lymph node right axilla measuring 4.3 cm.  At least 1 additional abnormal lymph node with cortical thickening identified in the right axilla.  Diffuse right breast skin thickening and increased trabeculation.   Biopsy right axillary node 11/29/2021-high-grade B-cell lymphoma with proliferation rate approaching 100%,  29% of the cells kappa restricted CD10 positive B cells Cycle 1 CEOP-Rituxan 12/06/2021 Cycle 2 CEOP-Rituxan 12/27/2021, Udenyca Cycle 3 CEOP-Rituxan 01/18/2022, Udenyca   11.  Left lower extremity cellulitis 12/21/2021.  Improved since beginning doxycycline 12/19/2021.  14-day course planned.     Disposition: Amanda Sellers has completed 2 cycles of systemic therapy for treatment of non-Hodgkin's lymphoma.  She has tolerated the treatment well.  The palpable right axillary mass has resolved.  She will complete cycle 3 today.  She tolerated Udenyca well and will receive Udenyca again with this cycle. The areas of breakdown at the right lower leg are likely related to increased edema.  There is no evidence of infection.  Amanda Sellers will return for an office visit and chemotherapy in 3 weeks.  She will undergo a restaging CT after cycle 4.  [name], MD  01/18/2022  9:45 AM

## 2022-01-19 ENCOUNTER — Inpatient Hospital Stay: Payer: PPO

## 2022-01-19 ENCOUNTER — Other Ambulatory Visit: Payer: Self-pay

## 2022-01-19 VITALS — BP 139/88 | HR 93 | Temp 97.7°F | Resp 20

## 2022-01-19 DIAGNOSIS — Z5112 Encounter for antineoplastic immunotherapy: Secondary | ICD-10-CM | POA: Diagnosis not present

## 2022-01-19 DIAGNOSIS — C8514 Unspecified B-cell lymphoma, lymph nodes of axilla and upper limb: Secondary | ICD-10-CM

## 2022-01-19 MED ORDER — PEGFILGRASTIM-CBQV 6 MG/0.6ML ~~LOC~~ SOSY
6.0000 mg | PREFILLED_SYRINGE | Freq: Once | SUBCUTANEOUS | Status: AC
  Administered 2022-01-19: 6 mg via SUBCUTANEOUS
  Filled 2022-01-19: qty 0.6

## 2022-01-19 NOTE — Patient Instructions (Signed)

## 2022-01-24 ENCOUNTER — Other Ambulatory Visit: Payer: Self-pay | Admitting: Cardiology

## 2022-01-24 ENCOUNTER — Ambulatory Visit (HOSPITAL_COMMUNITY): Payer: PPO | Admitting: Nurse Practitioner

## 2022-01-25 ENCOUNTER — Ambulatory Visit (HOSPITAL_COMMUNITY)
Admission: RE | Admit: 2022-01-25 | Discharge: 2022-01-25 | Disposition: A | Discharge status: Home / Self Care | Payer: PPO | Source: Ambulatory Visit | Attending: Nurse Practitioner | Admitting: Nurse Practitioner

## 2022-01-25 ENCOUNTER — Encounter (HOSPITAL_COMMUNITY): Payer: Self-pay | Admitting: Nurse Practitioner

## 2022-01-25 ENCOUNTER — Other Ambulatory Visit: Payer: Self-pay

## 2022-01-25 VITALS — BP 122/46 | HR 71 | Ht 66.0 in | Wt 199.7 lb

## 2022-01-25 DIAGNOSIS — I480 Paroxysmal atrial fibrillation: Secondary | ICD-10-CM

## 2022-01-25 DIAGNOSIS — I110 Hypertensive heart disease with heart failure: Secondary | ICD-10-CM | POA: Diagnosis not present

## 2022-01-25 DIAGNOSIS — R609 Edema, unspecified: Secondary | ICD-10-CM | POA: Diagnosis not present

## 2022-01-25 DIAGNOSIS — I5030 Unspecified diastolic (congestive) heart failure: Secondary | ICD-10-CM | POA: Insufficient documentation

## 2022-01-25 DIAGNOSIS — Z7901 Long term (current) use of anticoagulants: Secondary | ICD-10-CM | POA: Diagnosis not present

## 2022-01-25 DIAGNOSIS — I4891 Unspecified atrial fibrillation: Secondary | ICD-10-CM | POA: Insufficient documentation

## 2022-01-25 DIAGNOSIS — C8510 Unspecified B-cell lymphoma, unspecified site: Secondary | ICD-10-CM | POA: Insufficient documentation

## 2022-01-25 DIAGNOSIS — D6869 Other thrombophilia: Secondary | ICD-10-CM

## 2022-01-25 DIAGNOSIS — I509 Heart failure, unspecified: Secondary | ICD-10-CM | POA: Diagnosis not present

## 2022-01-25 MED ORDER — FUROSEMIDE 40 MG PO TABS: ORAL_TABLET | ORAL | Status: DC

## 2022-01-25 NOTE — Progress Notes (Signed)
Patient ID: Amanda Sellers, female   DOB: 03-Sep-1932, 89 y.o.   MRN: [id number]           Primary Care Physician: Ladell Pier, MD Referring Physician: Dr. Ronni Rumble Amanda Sellers is a 89 y.o. female with a h/o  diastolic heart failure, afib, newly diagnosed with recent dx of lymphoma. She was admitted to [facility] 9/22 on IV dilt and given diuretics for acute on chronic systolic HF.  It was arranged for to undergo TEE DCCV.   She had exertional dyspnea with PND & orthopnea. She felt very tired & had little energy.  She  noted a few palpitations, but does not recall rapid irregular heart rates/rhythms.    She had TEE without thrombus and underwent DCCV that was successful with one shock of 120J. She did well post procedure and was seen by Dr. Jerilynn Mages. Croitoru and found stable for discharge.   She was negative 2700cc since admit and weight was down 4 lbs with IV diuretics.  D/C wt is 168.  Plan on d/c was for  uninterrupted anticoagulation for next 30 days, preferably lifelong barring bleeding complications.  AFib clinic follow up in 1-2 weeks. Outpatient [facility], non-urgent.  ASA stopped.   She was seen in the afib clinic, 10/10 and found to be in afib with rvr. She went into afib short time after cardioversion. EKG showed afib at 130-140 bpm. She was  not on any drugs for rate control.  Is taking DOAC without fail. Continues with chemotherapy for lymphoma.   She was started on BB and  returned to clinic 10/12 with better v rates. 80's with sitting and 90-100's with activity. She does not have any PND/orthopnea although she has slept better in her recliner for months now.No lower extremity edema. Her weight is stable at home.  She has  finished chemotherapy for non- hodgkin's lymphoma and had f/u Pet scan 10/26,  and CA appears to be in remission, so per pt, no more chemo for right now. She has been on DOAC now x one month so will start attempts to restore SR. Discussed options  amiodarone or tikosyn and she would prefer amiodarone as she has two neighbors on amiodarone and they are both in rhythm and tolerating drug well. She is not interested in a hospital stay for drug loading. She reports rash with recent contrast dye. Amiodarone was started 200 mg bid, to then go down to 200 mg a day and then would discuss cardioversion.  She asked to be seen 11/2,  in the afib clinic for increased shortness of breath yesterday and last night. She has slept in the recliner for months but did not feel as well last night and slept poorly, feeling more short of breath and fullness in her chest.She has noticed increased pedal edema. She did not take her amiodarone last night or this am. Ekg shows rate controlled afib at 82 bpm, qtc at 464 ms.PO on RA is 98%. Weight is up 2 lbs from last visit, 6 lbs since 10/17. Lasix dose was decreased to 40 mg daily from 40 mg am and 20 mg pm when seen by Dr. Ellyn Hack 10/22.. Pt has noticed weight climbing for several days. She is fairly comfortable when quiet and sitting.   She was asked to double lasix to 80 mg  x 3 days, and to reduce amiodarone 200 mg a day. She had lost 3 pounds on return to clinic several days later. Clinically, she  is much improved with more energy and less shortness of breath. Able to sleep well. Had enough energy to walk into clinic today without wheelchair and felt well enough to clean her house yesterday. Ideally,I would like to load amiodarone a while longer but will go ahead and  pursue DCCV sooner than later, as it is contributing to her heart failure. Compliant with blood thinner without missed doses On recent labs, TSH was elevated but daughter explained that she just started taking replacement correctly on an empty stomach, and sees PCP 11/16 and she expects him to adjust the dose.  11/14-  Returns to afib clinic with a smile on her face and reporting all the activities she has been doing over the last week, having returned to  sinus rhythm after cardioversion. Much more energy and breathing is much improved.  Fluid status is stable.  12/13- Returns to afib clinic for f/u and reports doing well No afib, no further shortness of breath or edema. She is back to her usual activities. Got a good report form the Ca unit recently. Tolerating amiodarone 200 mg a day.  Return to afib clinic 4/18, reports that she is staying in [place]. Saw pulmonology yesterday for some c/o of shortness of breath but breathing much improved since staying in SR. Xray no active process, thyroid, liver studies drawn yesterday, and normal. MD said he would call hr after results of tests were reviewed. Fluid status normal.  F/u in afib clinic 01/31/16, pt continues to do well without any afib. Her fluid status is normal. Last TSH, 3 months ago, showed mild increase and will check TSH and liver panel today. Her breathing is good other than the last week in the bitter cold weather  she does not breath as good outside.No bleeding issues with eliquis. Fluid status is stable on lasix.  F/u in afib 01/26/17  for shortness of breath. Pt has been having shortness of breath for some while but contributed it to her knees and difficultly in ambulating. She was seen by pulmonology in July with improved PFT's and was OK to continue amiodarone. She was seen in November by Dr. Ellyn Hack and shortness of breath was thought to be stable/chronic. She feels that her shortness of breath has worsened since Christmas.  If she walks to the bathroom and back she is entirely out of breath. Sleeps on 2 pillows and has not felt the need to increase to elevate her head more at night. Ekg today shows junctional rhythm at 46 bpm. She is breathless speaking and started walking to clinic room but had to stop and get w/c. PO 99%.  F/u in afib clinic 1/7, after reducing amiodarone to 1/2 tab a day as well BB to 1/2 tab a day for brady as well as increase in shortness of breath. She is feeling much better  today to the point she was able to walk to clinic and not use w/c. She is not breathless talking.  F/u in afib clinic, 1/23, She feels great! Saw her pulmonologist recently and she said he was very pleased with her lung status. She is walking with a walker and observed coming into the clinic, no exertional dyspnea noted.  Her  HR is Sinus in the 40's today, but she is not symptomatic. She checks her HR with a pulse ox several times a day and it usually runs around 53-60.   F/u in afib clinic, 08/15/17. She is feeling well. Just saw Dr. Lenna Gilford and was given a  good report. Ekg, SR with HR's in the 50's. 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 She has some chromic LE, which was worse after recent knee injections.. She did increase lasix for a couple of days and now back to baseline now.  F/u in afib clinic, 1/8, she is doing very well. She is in SR and has not noted any afib. 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 No LLE. She walks with a walker, no falls. No shortness of breath. On amiodarone 100 mg qd. Notices a little numbness in her distal fingertips intermittently.No issues with anticoagulation.  F/u in 07/31/18. Continues on amiodarone for afib management. She is in [place] today and has not noted any irregular heart rhythm. Her main complaint is that she has noted more LLE and has been back to sleeping in her recliner. She continues on lasix 40 mg bid. Avoids salt and salty foods and sits with her feet up during the day. No change in her health other than increase in  edema. She does drink 8 + glasses of water a day and may need to back off that some.weight is up 7 lbs when compared to last in person office visit in March.  F/u 03/11/19. She states that she feels great. She is in SR, no appreciated afib. Continues on amiodarone.  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 No pedal edema or shortness of breath. No issues with eliquis with a CHA2DS2VASc score of 5.  F/u in afib clinic,  09/16/19. She remains on amiodarone 100 mg daily and is staying in SR. She was hospitalized for fluid retention/shortness of breath in April 2021. Had a LHC, had some CAD but thought to be best treated medically. She was placed on entresto and amlodipine. She has done well since then. Denies any dyspnea  today. She continues to sit with the elderly, but now has a break, as the woman she was staying with fell and broke her hip and is still in the hospital. She is currently in the donut hole with entresto and eliquis, being her most expensive drugs.   F/u in afib clinic, 04/30/20. She remains in SR with  On 200 mg amiodarone daily.  It was increased  with she had afib with RVR/HF hospitalization last spring. Labs checked in January with normal liver enzymes and normal TSH. She has yearly PFT's with her pulmonologist. She has some chronic shortness of breath, at baseline. She is still sitting with a lady in her 101's for 4 days a week.   F/u in afib clinic, 03/10/21. Unfortunately, since I saw pt last, she had a fall, no fractures but was sent to a rehab facility. Pt states  "They almost killed me". Her lasix was held for the most part that she was there and she  developed weeping  legs, rt worse than left and was seen on d/c by PCP with dx of rt lower leg lymphedema, recurrent superficial ulcerations, in the setting of chronic renal insufficieny. Pt states that her leg is much improved but still has some ulcerations on top of her rt foot. She is being followed by [facility]  and has f/u with them again on Monday. EKG shows sinus brady at 51 bpm, with bigeminy PAC's with LBBB. Will reduce amiodarone to 100 mg daily.  F/u in afib clinic, 04/13/21. She was suppose to have TSH rechecked today as she was instructed to reduce amiodarone  to 100 mg on last visit but she forgot and has been taking 1/2 tab bid ( 200 mg) since that time. Her PCP has adjusted her Levothyroxine as well as she has a f/u with him in the next few  weeks. Otherwise, she is feeling stronger and her rt leg is healing well. She is in SR.   F/u with afib clinic, 07/14/21, for amiodarone surveillance. Pt reports that  the swelling in her legs is so much better with compression pumps that she has been able to reduce lasix. She is feeling stronger. She is on amiodarone 100 mg daily now (from 200 mg daily)  and her TSH is trending down. She is active around the house. BP at home well controlled.  F/u in afib clinic for amiodarone surveillance.she is here with daughter today. She has not had any awareness of afib. EKG shows SR. She has had to resume chemotherapy for treatment of non Hodgkins  B cell lymphoma that was in remission since 2016. She is tolerating ok. She remains on amiodarone 100 mg daily.  Today, she denies symptoms of palpitations, chest pain,  orthopnea, PND, mild baseline chronic lower extremity edema, no dizziness, presyncope, syncope, or neurologic sequela.  The patient is tolerating medications without difficulties and is otherwise without complaint today.   Past Medical History:  Diagnosis Date   Anemia of chronic disease    Anticoagulation adequate, Eliquis 10/16/2014   Anxiety    clonaz helps this AND her breathing   Cholelithiasis without cholecystitis 07/2020   noted on CT abd/pelv at [facility]   Chronic combined systolic and diastolic congestive heart failure ([place]) 07/16/7626   Complicated by atrial fibrillation; Echo 07/23/14: mild LVH, EF 50-55%, Gr 2 DD.;  Echo May 15, 2019: EF 35 to 40%, GRII DD.  (In setting of CHF exacerbation, class III)-> baseline class II symptoms. 09/2019 EF 55-60%, nl wall motion and LV fxn, grd II DD, mod MR.   Chronic renal insufficiency, stage III (moderate) (HCC)    GFR @ 50   COPD (chronic obstructive pulmonary disease) ([place])    recently noted on Xray, does not have any problems   Diverticulosis    Follicular lymphoma ([place])    Non Hodgkins B cell lymphoma; s/p chemo summer  2016--in remission still as of 09/2018: continue obs q 67mo  Hiatal hernia 07/2020   moderate size-noted on CT abd/pel[facility]     06/2014   HTN (hypertension)    Hypothyroidism    IFG (impaired fasting glucose)    Lip cancer    Dr. BJanace Hoardexcised this: invasive SCC--no sign of cancer at ENT f/u 10/2015   Lymphedema    R>>L   Microcytic anemia    transfused 3 U total in hosp 07/2014   Obesity (BMI 30-39.9)    Pancytopenia due to antineoplastic chemotherapy ([facility]    Persistent atrial fibrillation ([facility]    With RVR; elec cardioversion 11/30/14.  On Amio since 10/2014; Dr. NLenna Gilfordfollowing her from pulm standpoint regarding this med.   Positive occult stool blood test 08/08/2014   Endoscopies ok 08/2014   Primary osteoarthritis of both knees 04/2016   Dr. GGladstone Lighter bone on bone--to get euflexxa to both knees.   Protein calorie malnutrition ([place] 2022   Pulmonary hypertension due to left heart disease ([place]    RLCP 05/19/2019: EF 35-40%. D1 50%, RI 70%, dLM-LAD 30%.  mPAP 43 mmHg - (62/19 mmHg), PCWP  28 mmHg (  V wave 41 mmHg) LVEDP 34 mmHg   Pulmonary metastases 07/24/2014   S/p left hip fracture 05/2020   ORIF   Venous stasis ulcer (HCC)    Right leg   Vitamin B 12 deficiency    Vitamin D deficiency    dose increased to 50 K U tab TWICE weekly 04/2017   Past Surgical History:  Procedure Laterality Date   ABDOMINAL HYSTERECTOMY  1978   BONE MARROW BIOPSY  07/24/2014   BREAST BIOPSY     CARDIOVERSION N/A 10/16/2014   Procedure: CARDIOVERSION;  Surgeon: Lelon Perla, MD;  [facility] END[facility]COPY;  Service: Cardiovascular;  Laterality: N/A;   CARDIOVERSION N/A 11/30/2014   Procedure: CARDIOVERSION;  Surgeon: Larey Dresser, MD;  Location: [place];  Service: Cardiovascular;  Laterality: N/A;   CATARACT EXTRACTION  2020   OU: Dr. Katy Fitch   COLONOSCOPY  09/23/2014   small hemorrhoids, otherwise normal (performed for IDA and heme+ stool)    COLONOSCOPY     [place]     May 2022   INTRAMEDULLARY (IM) NAIL INTERTROCHANTERIC Left 05/25/2020   Procedure: INTRAMEDULLARY (IM) NAIL INTERTROCHANTRIC;  Surgeon: Erle Crocker, MD;  Location: [place];  Service: Orthopedics;  Laterality: Left;   MASS EXCISION Left 07/15/2015   Left lower lip mass--invasive SCC w/negative margins.  Procedure: EXCISION MASS;  Surgeon: Melissa Montane, MD;  Location: [place];  Service: ENT;  Laterality: Left;  Wedge excision left lower lip mass   NM MYOVIEW LTD  10/29/2014   medium size mild surgery defect in the mid anterior and apical anterior location suggestive of breast attenuation. No reversibility. LOW RISK   PFTs  02/2015   Restriction with diffusion defect: cardiologist referred her to pulm to help interpret PFTs and decide whether she has amiodarone toxicity   RIGHT/LEFT HEART CATH AND CORONARY ANGIOGRAPHY N/A 05/19/2019   Procedure: RIGHT/LEFT HEART CATH AND CORONARY ANGIOGRAPHY;  Surgeon: Troy Sine, MD;  Location: [facility];; EF 35-40%. D1 50%, RI 70%, dLM-LAD 30%.  mPAP 43 mmHg - (62/19 mmHg), PCWP  28 mmHg (V wave 41 mmHg) LVEDP 34 mmHg   TEE WITHOUT CARDIOVERSION N/A 10/16/2014   Procedure: TRANSESOPHAGEAL ECHOCARDIOGRAM (TEE);  Surgeon: Lelon Perla, MD;  Location: [facility] ENDOSCOPY;  Service: Cardiovascular;  Laterality: N/A;   TRANSTHORACIC ECHOCARDIOGRAM  10/09/2019   EF 55 to 60%.  GRII DD.  No R WMA.  Normal PAP and RAP.  Severe LA dilation.  Mild RA dilation.  Moderate MR (likely related to annular dilation in the setting of severe LAE.  Mild aortic valve sclerosis-no stenosis.   TRANSTHORACIC ECHOCARDIOGRAM  05/15/2019   Acute CHF exacerbation:  EF 35-40%.  Moderately reduced function.  (Previous EF was 55 to 60%) global HK.  GRII DD.  Mild LA dilation.  Normal RV size.   UPPER GI ENDOSCOPY  09/23/2014   small hiatus hernia, nodules in stomach biopsied (chronic active erosive atrophic gastritis  with intestinal metaplasia--no dysplasia or malignancy) otherwise normal    Current Outpatient Medications  Medication Sig Dispense Refill   amiodarone (PACERONE) 200 MG tablet Take 0.5 tablets (100 mg total) by mouth daily. 30 tablet 6   apixaban (ELIQUIS) 5 MG TABS tablet TAKE 1 TABLET BY MOUTH TWICE A DAY 60 tablet 6   cyanocobalamin (,VITAMIN B-12,) 1000 MCG/ML injection INJECT 1 ML INTO THE SKIN EVERY 30 DAYS. 3 mL 3   diclofenac Sodium (VOLTAREN) 1 % GEL APPLY  2 G TOPICALLY 4 (FOUR) TIMES DAILY. APPLY TO LE JOINTS THAT ARE IN PAIN. 300 g 1   ENTRESTO 24-26 MG TAKE 1 TABLET BY MOUTH TWICE A DAY 60 tablet 11   fish oil-omega-3 fatty acids 1000 MG capsule Take 1 g by mouth daily.     fluticasone (CUTIVATE) 0.05 % cream Apply to affected area of lower legs bid prn 60 g 5   HYDROcodone-acetaminophen (NORCO/VICODIN) 5-325 MG tablet 1-2 tabs po qid prn pain 100 tablet 0   hydrOXYzine (VISTARIL) 25 MG capsule TAKE 1 TO 2 CAPSULES BY MOUTH AT BEDTIME FOR SLEEP 180 capsule 1   levothyroxine (SYNTHROID) 112 MCG tablet TAKE 1 TABLET BY MOUTH EVERY DAY 90 tablet 1   omeprazole (PRILOSEC) 20 MG capsule Take 1 capsule (20 mg total) by mouth daily. 90 capsule 3   OXYGEN Inhale into the lungs. 1Lt at bedtime  as needed     Potassium Chloride ER 20 MEQ TBCR Take 1 tablet by mouth daily. 90 tablet 3   predniSONE (DELTASONE) 20 MG tablet Take 3 tablets (60 mg total) by mouth daily with breakfast. Take x 4 days beginning the day after chemotherapy treatment 12 tablet 2   prochlorperazine (COMPAZINE) 5 MG tablet TAKE 1 TABLET BY MOUTH EVERY 6 HOURS AS NEEDED FOR NAUSEA OR VOMITING. 60 tablet 1   rosuvastatin (CRESTOR) 20 MG tablet TAKE 1 TABLET BY MOUTH EVERY DAY 90 tablet 3   Vitamin D, Ergocalciferol, (DRISDOL) 1.25 MG (50000 UNIT) CAPS capsule Take 1 capsule (50,000 Units total) by mouth 2 (two) times a week. 24 capsule 3   furosemide (LASIX) 40 MG tablet Taking 1 tablet by mouth daily and 1/2 tablet in the  afternoon     No current facility-administered medications for this encounter.    Allergies  Allergen Reactions   Aspirin Itching   Calcium Alginate Other (See Comments)    Excessive burning   Morphine     Other reaction(s): Hallucinations   Iodinated Contrast Media Rash and Other (See Comments)    CT Contrast    Social History   Socioeconomic History   Marital status: Widowed    Spouse name: Not on file   Number of children: 8   Years of education: Not on file   Highest education level: Not on file  Occupational History   Occupation: retired  Tobacco Use   Smoking status: Never   Smokeless tobacco: Never  Vaping Use   Vaping Use: Never used  Substance and Sexual Activity   Alcohol use: No    Alcohol/week: 0.0 standard drinks of alcohol   Drug use: No   Sexual activity: Not on file  Other Topics Concern   Not on file  Social History Narrative   Widowed, has 8 children.   Orig from [place], now lives in [place].   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   No tob/alc/drugs.   Tries to walk, but her back starts to hurt.  Has to use a walker.   Social Determinants of Health   Financial Resource Strain: Low Risk  (01/05/2021)   Overall Financial Resource Strain (CARDIA)    Difficulty of Paying Living Expenses: Not hard at all  Food Insecurity: No Food Insecurity (01/05/2021)   Hunger Vital Sign    Worried About Running Out of Food in the Last Year: Never true    Ran Out of Food in the Last Year: Never true  Transportation  Needs: No Transportation Needs (01/05/2021)   PRAPARE - Hydrologist (Medical): No    Lack of Transportation (Non-Medical): No  Physical Activity: Insufficiently Active (01/05/2021)   Exercise Vital Sign    Days of Exercise per Week: 5 days    Minutes of Exercise per Session: 10 min  Stress: No Stress Concern Present (01/05/2021)   [place]    Feeling of Stress : Not at all  Social Connections: Moderately Isolated (01/05/2021)   Social Connection and Isolation Panel [NHANES]    Frequency of Communication with Friends and Family: More than three times a week    Frequency of Social Gatherings with Friends and Family: Once a week    Attends Religious Services: More than 4 times per year    Active Member of [employer] or Organizations: No    Attends Archivist Meetings: Never    Marital Status: Widowed  Intimate Partner Violence: Not At Risk (01/05/2021)   Humiliation, Afraid, Rape, and Kick questionnaire    Fear of Current or Ex-Partner: No    Emotionally Abused: No    Physically Abused: No    Sexually Abused: No    Family History  Problem Relation Age of Onset   Melanoma Mother    Stomach cancer Father    Lymphoma Sister    Diabetes Daughter     ROS- All systems are reviewed and negative except as per the HPI above  Physical Exam: Vitals:   01/25/22 1030  BP: (!) 122/46  Pulse: 71  Weight: 90.6 kg  Height: _0  (1.676 m)    GEN- The patient is well appearing, alert and oriented x 3 today.   Head- normocephalic, atraumatic Eyes-  Sclera clear, conjunctiva pink Ears- hearing intact Oropharynx- clear Neck- supple, no JVP Lymph- no cervical lymphadenopathy Lungs- Clear to ausculation bilaterally, normal work of breathing Heart-  regular rate and rhythm, no murmurs, rubs or gallops, PMI not laterally displaced GI- soft, NT, ND, + BS Extremities- no clubbing, cyanosis, 2+  Edema to knee area MS- no significant deformity or atrophy Skin- no rash or lesion Psych- euthymic mood, full affect Neuro- strength and sensation are intact  EKG  Vent. rate 71 BPM PR interval 210 ms QRS duration 130 ms QT/QTcB 446/484 ms P-R-T axes 90 34 89 Sinus rhythm with 1st degree A-V block Non-specific intra-ventricular conduction block Cannot rule out Anterior infarct , age  undetermined Abnormal ECG When compared with ECG of 14-Jul-2021 10:50, PREVIOUS ECG IS PRESEN   Assessment and Plan: 1 Afib Maintaining SR on 100 mg amiodarone  TSH stable per PCP checked last in October Off BB for some time now  for bradycardia Continue apixaban for chadsvasc score of at least 4  Continue yearly PFT's  thru  pulmonology   2. Diastolic heart failure LLE chronic and stable  Weight  stable  Continue  lasix  as prescribed  Continue  entresto   Avoid salt   3. HTN Stable   4. Non Hodgkns B cell lymphoma Undergoing  chemotherapy Per oncology   F/u with Dr. Ellyn Hack, [place] clinic as needed    Geroge Baseman. [place], [facility] [address] [place],  [id number] [phone]

## 2022-01-27 ENCOUNTER — Encounter: Payer: Self-pay | Admitting: *Deleted

## 2022-01-27 ENCOUNTER — Telehealth: Payer: Self-pay | Admitting: *Deleted

## 2022-01-27 NOTE — Patient Outreach (Signed)
  Care Coordination   Initial Visit Note   01/27/2022 Name: AIYONNA LUCADO MRN: [id number] DOB: 1933/01/22  Mally Gavina Darrington is a 89 y.o. year old female who sees Ladell Pier, MD for primary care. I  spoke with daughter [name].  What matters to the patients health and wellness today?  No needs    Goals Addressed             This Visit's Progress    COMPLETED: Care coordination activity       Care Coordination Interventions: Reviewed medications with patient and discussed adherence with needed refills Reviewed scheduled/upcoming provider appointments including sufficient transportation source Assessed social determinant of health barriers Educated on care management services with no specified needs presented at this time.         SDOH assessments and interventions completed:  Yes  SDOH Interventions Today    Flowsheet Row Most Recent Value  SDOH Interventions   Food Insecurity Interventions Intervention Not Indicated  Housing Interventions Intervention Not Indicated  Transportation Interventions Intervention Not Indicated  Utilities Interventions Intervention Not Indicated        Care Coordination Interventions:  Yes, provided   Follow up plan: No further intervention required.   Encounter Outcome:  Pt. Visit Completed   Raina Mina, RN Care Management Coordinator [place] Office [phone]

## 2022-01-27 NOTE — Patient Instructions (Signed)
Visit Information  Thank you for taking time to visit with me today. Please don't hesitate to contact me if I can be of assistance to you.   Following are the goals we discussed today:   Goals Addressed             This Visit's Progress    COMPLETED: Care coordination activity       Care Coordination Interventions: Reviewed medications with patient and discussed adherence with needed refills Reviewed scheduled/upcoming provider appointments including sufficient transportation source Assessed social determinant of health barriers Educated on care management services with no specified needs presented at this time.         Please call the care guide team at [phone] if you need to cancel or reschedule your appointment.   If you are experiencing a Mental Health or [place] or need someone to talk to, please call the Suicide and Crisis Lifeline: 988  Patient verbalizes understanding of instructions and care plan provided today and agrees to view in [name]. Active MyChart status and patient understanding of how to access instructions and care plan via MyChart confirmed with patient.     No further follow up required: No follow up needs  Raina Mina, RN Care Management Coordinator [place] [facility] [phone]

## 2022-02-01 ENCOUNTER — Encounter: Payer: Self-pay | Admitting: Family Medicine

## 2022-02-01 ENCOUNTER — Ambulatory Visit (INDEPENDENT_AMBULATORY_CARE_PROVIDER_SITE_OTHER): Payer: PPO | Admitting: Family Medicine

## 2022-02-01 VITALS — BP 144/65 | HR 67 | Temp 97.4°F | Ht 66.0 in | Wt 199.4 lb

## 2022-02-01 DIAGNOSIS — E039 Hypothyroidism, unspecified: Secondary | ICD-10-CM | POA: Diagnosis not present

## 2022-02-01 DIAGNOSIS — R600 Localized edema: Secondary | ICD-10-CM | POA: Diagnosis not present

## 2022-02-01 DIAGNOSIS — M79629 Pain in unspecified upper arm: Secondary | ICD-10-CM

## 2022-02-01 DIAGNOSIS — N1831 Chronic kidney disease, stage 3a: Secondary | ICD-10-CM

## 2022-02-01 DIAGNOSIS — Z79899 Other long term (current) drug therapy: Secondary | ICD-10-CM | POA: Diagnosis not present

## 2022-02-01 DIAGNOSIS — C8514 Unspecified B-cell lymphoma, lymph nodes of axilla and upper limb: Secondary | ICD-10-CM

## 2022-02-01 DIAGNOSIS — G894 Chronic pain syndrome: Secondary | ICD-10-CM

## 2022-02-01 LAB — TSH: TSH: 9.78 u[IU]/mL — ABNORMAL HIGH (ref 0.35–5.50)

## 2022-02-01 MED ORDER — HYDROXYZINE PAMOATE 25 MG PO CAPS: ORAL_CAPSULE | ORAL | 1 refills | Status: DC

## 2022-02-01 MED ORDER — HYDROCODONE-ACETAMINOPHEN 5-325 MG PO TABS: ORAL_TABLET | ORAL | 0 refills | Status: DC

## 2022-02-01 MED ORDER — FLUTICASONE PROPIONATE 0.05 % EX CREA: TOPICAL_CREAM | CUTANEOUS | 3 refills | Status: DC

## 2022-02-01 NOTE — Progress Notes (Signed)
OFFICE VISIT  02/01/2022  CC:  Chief Complaint  Patient presents with   Medical Management of Chronic Issues    Pt is fasting    Patient is a 89 y.o. female who presents accompanied by her daughter for 65-monthfollow-up chronic pain syndrome, bilateral lower extremity edema, hypothyroidism, and chronic renal insufficiency. A/P as of last visit: "1 chronic pain syndrome. Stable.  Continue Vicodin 5-325, 1-2 4 times daily as needed. No new prescription was needed today.   2.  Bilateral lower extremity edema.  Stable. Continue Lasix 20 mg twice daily.   3.  Chronic renal insufficiency stage III. Lites and creatinine today.   4.  Hypothyroidism.  She is on amiodarone. TSH today.   #5 right axillary mass: Could be some reactive lymphadenopathy from having 2 vaccines in that shoulder about 6 weeks ago.   Needs to rule out recurrence of non-Hodgkin's lymphoma.  She is arranging follow-up with her oncologist."  INTERIM HX: Since I last saw she was diagnosed with a recurrence of high-grade B cell non-Hodgkin's lymphoma in the right axillary lymph nodes.  She was started on chemotherapy with Cytoxan/etoposide/vincristine, prednisone, and rituxan. Has done 3 treatments.  Fourth treatment planned for next week, at which time she will get labs and see the oncologist. Lately her oncologist has been prescribing her pain medication, but in slightly smaller amounts than I had been prescribing.  Patient notes that this is not quite enough.  She has to take 6 Vicodin tabs per day in order to get control of her back and leg pain. She takes 40 mg furosemide in the morning and 20 mg in the afternoon. Her family increased her dose from a few weeks ago because she had a little bit more lower extremity swelling than usual. No recent erythema on the lower legs.  Fluticasone helps her venous stasis dermatitis rash a lot.   PMP AWARE reviewed today: most recent rx for Vicodin was filled 01/18/2022, # 100,  rx by Dr. GBetsy Coder Most recent Vicodin prescription that was prescribed by me was filled 11/23/21, #180. No red flags.  ROS as above, plus--> no fevers, no CP, no SOB, no wheezing, no cough, no dizziness, no HAs, no rashes, no melena/hematochezia.  No polyuria or polydipsia.  No focal weakness, paresthesias, or tremors.  No acute vision or hearing abnormalities.  No dysuria or unusual/new urinary urgency or frequency.  No recent changes in lower legs. No n/v/d or abd pain.  No palpitations.    Past Medical History:  Diagnosis Date   Anemia of chronic disease    Anticoagulation adequate, Eliquis 10/16/2014   Anxiety    clonaz helps this AND her breathing   Cholelithiasis without cholecystitis 07/2020   noted on CT abd/pelv at [facility]   Chronic combined systolic and diastolic congestive heart failure ([facility] 085/46/2703  Complicated by atrial fibrillation; Echo 07/23/14: mild LVH, EF 50-55%, Gr 2 DD.;  Echo May 15, 2019: EF 35 to 40%, GRII DD.  (In setting of CHF exacerbation, class III)-> baseline class II symptoms. 09/2019 EF 55-60%, nl wall motion and LV fxn, grd II DD, mod MR.   Chronic renal insufficiency, stage III (moderate) (HCC)    GFR @ 50   COPD (chronic obstructive pulmonary disease) ([facility]    recently noted on Xray, does not have any problems   Diverticulosis    Follicular lymphoma ([place] [place]    Non Hodgkins B cell lymphoma; s/p chemo summer 2016--in remission still as of  09/2018: continue obs q 27mo  Hiatal hernia 07/2020   moderate size-[facility] [place] abd/[facility])     06/2014   HTN (hypertension)    Hypothyroidism    IFG (impaired fasting glucose)    Lip cancer    Dr. BJanace Hoardexcised this: invasive SCC--no sign of cancer at ENT f/u 10/2015   Lymphedema    R>>L   Microcytic anemia    transfused 3 U total in hosp 07/2014   Obesity (BMI 30-39.9)    Pancytopenia due to antineoplastic chemotherapy ([facility]    Persistent atrial  fibrillation ([facility]    With RVR; elec cardioversion 11/30/14.  On Amio since 10/2014; Dr. NLenna Gilfordfollowing her from pulm standpoint regarding this med.   Positive occult stool blood test 08/08/2014   Endoscopies ok 08/2014   Primary osteoarthritis of both knees 04/2016   Dr. GGladstone Lighter bone on bone--to get euflexxa to both knees.   Protein calorie malnutrition ([facility] 2022   Pulmonary hypertension due to left heart disease ([facility]    RLCP 05/19/2019: EF 35-40%. D1 50%, RI 70%, dLM-LAD 30%.  mPAP 43 mmHg - (62/19 mmHg), PCWP  28 mmHg (V wave 41 mmHg) LVEDP 34 mmHg   Pulmonary metastases 07/24/2014   S/p left hip fracture 05/2020   ORIF   Venous stasis ulcer (HCC)    Right leg   Vitamin B 12 deficiency    Vitamin D deficiency    dose increased to 50 K U tab TWICE weekly 04/2017    Past Surgical History:  Procedure Laterality Date   ABDOMINAL HYSTERECTOMY  1978   BONE MARROW BIOPSY  07/24/2014   BREAST BIOPSY     CARDIOVERSION N/A 10/16/2014   Procedure: CARDIOVERSION;  Surgeon: BLelon Perla MD;  [facility];  Service: Cardiovascular;  Laterality: N/A;   CARDIOVERSION N/A 11/30/2014   Procedure: CARDIOVERSION;  Surgeon: DLarey Dresser MD;  [place] [facility]  Service: Cardiovascular;  Laterality: N/A;   CATARACT EXTRACTION  2020   OU: Dr. GKaty Fitch  COLONOSCOPY  09/23/2014   small hemorrhoids, otherwise normal (performed for IDA and heme+ stool)   COLONOSCOPY     [facility]    May 2022   INTRAMEDULLARY (IM) NAIL INTERTROCHANTERIC Left 05/25/2020   Procedure: INTRAMEDULLARY (IM) NAIL INTERTROCHANTRIC;  Surgeon: AErle Crocker MD;  [place]  Service: Orthopedics;  Laterality: Left;   MASS EXCISION Left 07/15/2015   Left lower lip mass--invasive SCC w/negative margins.  Procedure: EXCISION MASS;  Surgeon: JMelissa Montane MD;  [facility]  Service: ENT;  Laterality: Left;  Wedge excision left lower lip mass   NM MYOVIEW LTD   10/29/2014   medium size mild surgery defect in the mid anterior and apical anterior location suggestive of breast attenuation. No reversibility. LOW RISK   PFTs  02/2015   Restriction with diffusion defect: cardiologist referred her to pulm to help interpret PFTs and decide whether she has amiodarone toxicity   RIGHT/LEFT HEART CATH AND CORONARY ANGIOGRAPHY N/A 05/19/2019   Procedure: RIGHT/LEFT HEART CATH AND CORONARY ANGIOGRAPHY;  Surgeon: KTroy Sine MD;  [facility];; EF 35-40%. D1 50%, RI 70%, dLM-LAD 30%.  mPAP 43 mmHg - (62/19 mmHg), PCWP  28 mmHg (V wave 41 mmHg) LVEDP 34 mmHg   TEE WITHOUT CARDIOVERSION N/A 10/16/2014   Procedure: TRANSESOPHAGEAL ECHOCARDIOGRAM (TEE);  Surgeon: BLelon Perla MD;  [facility];  Service: Cardiovascular;  Laterality: N/A;   TRANSTHORACIC ECHOCARDIOGRAM  10/09/2019   EF 55 to 60%.  GRII DD.  No R WMA.  Normal PAP and RAP.  Severe LA dilation.  Mild RA dilation.  Moderate MR (likely related to annular dilation in the setting of severe LAE.  Mild aortic valve sclerosis-no stenosis.   TRANSTHORACIC ECHOCARDIOGRAM  05/15/2019   Acute CHF exacerbation:  EF 35-40%.  Moderately reduced function.  (Previous EF was 55 to 60%) global HK.  GRII DD.  Mild LA dilation.  Normal RV size.   UPPER GI ENDOSCOPY  09/23/2014   small hiatus hernia, nodules in stomach biopsied (chronic active erosive atrophic gastritis with intestinal metaplasia--no dysplasia or malignancy) otherwise normal    Outpatient Medications Prior to Visit  Medication Sig Dispense Refill   amiodarone (PACERONE) 200 MG tablet Take 0.5 tablets (100 mg total) by mouth daily. 30 tablet 6   apixaban (ELIQUIS) 5 MG TABS tablet TAKE 1 TABLET BY MOUTH TWICE A DAY 60 tablet 6   cyanocobalamin (,VITAMIN B-12,) 1000 MCG/ML injection INJECT 1 ML INTO THE SKIN EVERY 30 DAYS. 3 mL 3   diclofenac Sodium (VOLTAREN) 1 % GEL APPLY 2 G TOPICALLY 4 (FOUR) TIMES DAILY. APPLY TO LE JOINTS  THAT ARE IN PAIN. 300 g 1   ENTRESTO 24-26 MG TAKE 1 TABLET BY MOUTH TWICE A DAY 60 tablet 11   fish oil-omega-3 fatty acids 1000 MG capsule Take 1 g by mouth daily.     fluticasone (CUTIVATE) 0.05 % cream Apply to affected area of lower legs bid prn 60 g 5   furosemide (LASIX) 40 MG tablet Taking 1 tablet by mouth daily and 1/2 tablet in the afternoon     HYDROcodone-acetaminophen (NORCO/VICODIN) 5-325 MG tablet 1-2 tabs po qid prn pain 100 tablet 0   hydrOXYzine (VISTARIL) 25 MG capsule TAKE 1 TO 2 CAPSULES BY MOUTH AT BEDTIME FOR SLEEP 180 capsule 1   levothyroxine (SYNTHROID) 112 MCG tablet TAKE 1 TABLET BY MOUTH EVERY DAY 90 tablet 1   omeprazole (PRILOSEC) 20 MG capsule Take 1 capsule (20 mg total) by mouth daily. 90 capsule 3   OXYGEN Inhale into the lungs. 1Lt at bedtime  as needed     Potassium Chloride ER 20 MEQ TBCR Take 1 tablet by mouth daily. 90 tablet 3   predniSONE (DELTASONE) 20 MG tablet Take 3 tablets (60 mg total) by mouth daily with breakfast. Take x 4 days beginning the day after chemotherapy treatment 12 tablet 2   prochlorperazine (COMPAZINE) 5 MG tablet TAKE 1 TABLET BY MOUTH EVERY 6 HOURS AS NEEDED FOR NAUSEA OR VOMITING. 60 tablet 1   rosuvastatin (CRESTOR) 20 MG tablet TAKE 1 TABLET BY MOUTH EVERY DAY 90 tablet 3   Vitamin D, Ergocalciferol, (DRISDOL) 1.25 MG (50000 UNIT) CAPS capsule Take 1 capsule (50,000 Units total) by mouth 2 (two) times a week. 24 capsule 3   No facility-administered medications prior to visit.    Allergies  Allergen Reactions   Aspirin Itching   Calcium Alginate Other (See Comments)    Excessive burning   Morphine     Other reaction(s): Hallucinations   Iodinated Contrast Media Rash and Other (See Comments)    CT Contrast    Review of Systems As per HPI  PE:    02/01/2022    9:53 AM 01/25/2022   10:30 AM 01/19/2022    2:23 PM  Vitals with BMI  Height '5\' 6"'$   $'5\' 6"'M$    Weight 199 lbs 6 oz 199 lbs 11 oz   BMI 72.0 94.70   Systolic  [phone]  Diastolic 65 46 88  Pulse 67 71 93  Initial bp today 144/65. Rpt 114/46  Physical Exam  Gen: Alert, well appearing.  Patient is oriented to person, place, time, and situation. AFFECT: pleasant, lucid thought and speech. EXT: 1+ RLL pitting edema with diffuse bronzing and fibrotic skin changes in pretibial region, right greater than left. No significant pitting edema on the left.   LABS:  Last CBC Lab Results  Component Value Date   WBC 2.7 (L) 01/18/2022   HGB 9.8 (L) 01/18/2022   HCT 31.5 (L) 01/18/2022   MCV 96.9 01/18/2022   MCH 30.2 01/18/2022   RDW 16.9 (H) 01/18/2022   PLT 130 (L) 01/18/2022   Lab Results  Component Value Date   IRON 93 06/27/2021   TIBC 337 06/27/2021   FERRITIN 128 06/27/2021   Lab Results  Component Value Date   VITAMINB12 637 47/65/4650   Last metabolic panel Lab Results  Component Value Date   GLUCOSE 128 (H) 01/18/2022   NA 143 01/18/2022   K 4.1 01/18/2022   CL 102 01/18/2022   CO2 31 01/18/2022   BUN 25 (H) 01/18/2022   CREATININE 1.13 (H) 01/18/2022   GFRNONAA 47 (L) 01/18/2022   CALCIUM 8.9 01/18/2022   PHOS 3.1 12/13/2021   PROT 6.0 (L) 01/18/2022   ALBUMIN 4.2 01/18/2022   LABGLOB 3.2 07/23/2014   AGRATIO 0.8 07/23/2014   BILITOT 0.6 01/18/2022   ALKPHOS 44 01/18/2022   AST 19 01/18/2022   ALT 12 01/18/2022   ANIONGAP 10 01/18/2022   Last lipids Lab Results  Component Value Date   CHOL 176 02/23/2020   HDL 71.00 02/23/2020   LDLCALC 74 02/23/2020   LDLDIRECT 106.0 05/17/2018   TRIG 158.0 (H) 02/23/2020   CHOLHDL 2 02/23/2020   Last hemoglobin A1c Lab Results  Component Value Date   HGBA1C 5.5 02/23/2020   Last thyroid functions Lab Results  Component Value Date   TSH 8.22 (H) 10/31/2021   Lab Results  Component Value Date   LABURIC 6.2 01/18/2022   IMPRESSION AND PLAN:  #1 chronic pain syndrome: Chronic low back pain and bilateral lower extremity pain due to venous insufficiency  edema. She takes 2 Vicodin at bedtime and a total of 4 tabs of Vicodin in the daytime. Will keep her on this dosing, #180/month.  #2 bilateral lower extremity edema. Right greater than left chronically. She is currently doing well on Lasix 40 mg in the morning and 20 mg in the afternoon. She gets electrolytes and creatinine monitored with her chemo treatments, planned for next week.  3.  Chronic renal insufficiency stage III. She avoids NSAIDs. Electrolytes and creatinine monitoring as per #2 above.  #4  Hypothyroidism. She is on amiodarone therapy so we are monitoring TSH closely. She takes 112 mcg levothyroxine daily. TSH today.  #5 Recurrence of high-grade B cell non-Hodgkin's lymphoma in the right axillary lymph nodes NOV 2023.  She was started on chemotherapy with Cytoxan/etoposide/vincristine, prednisone, and rituxan. Has done 3 treatments.  Fourth treatment planned for next week, at which time she will get labs and see the oncologist.  An After Visit Summary was printed and given to the patient.  FOLLOW UP: Return in about 3 months (around 05/03/2022) for routine chronic illness f/u.  Signed:  Crissie Sickles, MD  02/01/2022  

## 2022-02-02 ENCOUNTER — Other Ambulatory Visit: Payer: Self-pay

## 2022-02-04 ENCOUNTER — Other Ambulatory Visit: Payer: Self-pay | Admitting: Oncology

## 2022-02-08 ENCOUNTER — Ambulatory Visit: Payer: PPO

## 2022-02-08 ENCOUNTER — Ambulatory Visit: Payer: PPO | Admitting: Oncology

## 2022-02-08 ENCOUNTER — Inpatient Hospital Stay: Payer: PPO | Admitting: Nurse Practitioner

## 2022-02-08 ENCOUNTER — Other Ambulatory Visit: Payer: PPO

## 2022-02-08 ENCOUNTER — Inpatient Hospital Stay: Payer: PPO

## 2022-02-08 ENCOUNTER — Inpatient Hospital Stay: Payer: PPO | Attending: Oncology

## 2022-02-08 ENCOUNTER — Encounter: Payer: Self-pay | Admitting: Nurse Practitioner

## 2022-02-08 VITALS — BP 123/46 | HR 71 | Temp 98.2°F | Resp 18 | Wt 196.7 lb

## 2022-02-08 VITALS — BP 153/54 | HR 74 | Temp 98.0°F | Resp 18

## 2022-02-08 DIAGNOSIS — L03116 Cellulitis of left lower limb: Secondary | ICD-10-CM | POA: Insufficient documentation

## 2022-02-08 DIAGNOSIS — G893 Neoplasm related pain (acute) (chronic): Secondary | ICD-10-CM | POA: Diagnosis not present

## 2022-02-08 DIAGNOSIS — C8200 Follicular lymphoma grade I, unspecified site: Secondary | ICD-10-CM | POA: Diagnosis not present

## 2022-02-08 DIAGNOSIS — Z79891 Long term (current) use of opiate analgesic: Secondary | ICD-10-CM | POA: Diagnosis not present

## 2022-02-08 DIAGNOSIS — Z5111 Encounter for antineoplastic chemotherapy: Secondary | ICD-10-CM | POA: Insufficient documentation

## 2022-02-08 DIAGNOSIS — Z5112 Encounter for antineoplastic immunotherapy: Secondary | ICD-10-CM | POA: Insufficient documentation

## 2022-02-08 DIAGNOSIS — C8514 Unspecified B-cell lymphoma, lymph nodes of axilla and upper limb: Secondary | ICD-10-CM

## 2022-02-08 LAB — CMP (CANCER CENTER ONLY)
ALT: 12 U/L (ref 0–44)
AST: 18 U/L (ref 15–41)
Albumin: 4.3 g/dL (ref 3.5–5.0)
Alkaline Phosphatase: 52 U/L (ref 38–126)
Anion gap: 10 (ref 5–15)
BUN: 26 mg/dL — ABNORMAL HIGH (ref 8–23)
CO2: 30 mmol/L (ref 22–32)
Calcium: 9.2 mg/dL (ref 8.9–10.3)
Chloride: 102 mmol/L (ref 98–111)
Creatinine: 1.15 mg/dL — ABNORMAL HIGH (ref 0.44–1.00)
GFR, Estimated: 46 mL/min — ABNORMAL LOW (ref >60)
Glucose, Bld: 110 mg/dL — ABNORMAL HIGH (ref 70–99)
Potassium: 3.9 mmol/L (ref 3.5–5.1)
Sodium: 142 mmol/L (ref 135–145)
Total Bilirubin: 0.8 mg/dL (ref 0.3–1.2)
Total Protein: 6.1 g/dL — ABNORMAL LOW (ref 6.5–8.1)

## 2022-02-08 LAB — CBC WITH DIFFERENTIAL (CANCER CENTER ONLY)
Abs Immature Granulocytes: 0.02 10*3/uL (ref 0.00–0.07)
Basophils Absolute: 0 10*3/uL (ref 0.0–0.1)
Basophils Relative: 0 %
Eosinophils Absolute: 0 10*3/uL (ref 0.0–0.5)
Eosinophils Relative: 1 %
HCT: 32.7 % — ABNORMAL LOW (ref 36.0–46.0)
Hemoglobin: 10.2 g/dL — ABNORMAL LOW (ref 12.0–15.0)
Immature Granulocytes: 1 %
Lymphocytes Relative: 18 %
Lymphs Abs: 0.6 10*3/uL — ABNORMAL LOW (ref 0.7–4.0)
MCH: 30.6 pg (ref 26.0–34.0)
MCHC: 31.2 g/dL (ref 30.0–36.0)
MCV: 98.2 fL (ref 80.0–100.0)
Monocytes Absolute: 0.3 10*3/uL (ref 0.1–1.0)
Monocytes Relative: 9 %
Neutro Abs: 2.3 10*3/uL (ref 1.7–7.7)
Neutrophils Relative %: 71 %
Platelet Count: 98 10*3/uL — ABNORMAL LOW (ref 150–400)
RBC: 3.33 MIL/uL — ABNORMAL LOW (ref 3.87–5.11)
RDW: 16.8 % — ABNORMAL HIGH (ref 11.5–15.5)
WBC Count: 3.3 10*3/uL — ABNORMAL LOW (ref 4.0–10.5)
nRBC: 0 % (ref 0.0–0.2)

## 2022-02-08 LAB — LACTATE DEHYDROGENASE: LDH: 241 U/L — ABNORMAL HIGH (ref 98–192)

## 2022-02-08 MED ORDER — SODIUM CHLORIDE 0.9 % IV SOLN
600.0000 mg/m2 | Freq: Once | INTRAVENOUS | Status: AC
  Administered 2022-02-08: 1240 mg via INTRAVENOUS
  Filled 2022-02-08: qty 50

## 2022-02-08 MED ORDER — ACETAMINOPHEN 325 MG PO TABS
650.0000 mg | ORAL_TABLET | Freq: Once | ORAL | Status: AC
  Administered 2022-02-08: 650 mg via ORAL
  Filled 2022-02-08: qty 2

## 2022-02-08 MED ORDER — PALONOSETRON HCL INJECTION 0.25 MG/5ML
0.2500 mg | Freq: Once | INTRAVENOUS | Status: AC
  Administered 2022-02-08: 0.25 mg via INTRAVENOUS
  Filled 2022-02-08: qty 5

## 2022-02-08 MED ORDER — SODIUM CHLORIDE 0.9 % IV SOLN
375.0000 mg/m2 | Freq: Once | INTRAVENOUS | Status: DC
  Filled 2022-02-08: qty 80

## 2022-02-08 MED ORDER — SODIUM CHLORIDE 0.9 % IV SOLN
50.0000 mg/m2 | Freq: Once | INTRAVENOUS | Status: AC
  Administered 2022-02-08: 100 mg via INTRAVENOUS
  Filled 2022-02-08: qty 5

## 2022-02-08 MED ORDER — SODIUM CHLORIDE 0.9 % IV SOLN: Freq: Once | INTRAVENOUS | Status: AC

## 2022-02-08 MED ORDER — VINCRISTINE SULFATE CHEMO INJECTION 1 MG/ML
1.0000 mg | Freq: Once | INTRAVENOUS | Status: AC
  Administered 2022-02-08: 1 mg via INTRAVENOUS
  Filled 2022-02-08: qty 1

## 2022-02-08 MED ORDER — SODIUM CHLORIDE 0.9 % IV SOLN
375.0000 mg/m2 | Freq: Once | INTRAVENOUS | Status: AC
  Administered 2022-02-08: 800 mg via INTRAVENOUS
  Filled 2022-02-08: qty 50

## 2022-02-08 MED ORDER — SODIUM CHLORIDE 0.9 % IV SOLN
10.0000 mg | Freq: Once | INTRAVENOUS | Status: AC
  Administered 2022-02-08: 10 mg via INTRAVENOUS
  Filled 2022-02-08: qty 10

## 2022-02-08 MED ORDER — DIPHENHYDRAMINE HCL 25 MG PO CAPS
25.0000 mg | ORAL_CAPSULE | Freq: Once | ORAL | Status: AC
  Administered 2022-02-08: 25 mg via ORAL
  Filled 2022-02-08: qty 1

## 2022-02-08 NOTE — Progress Notes (Signed)
[place] OFFICE PROGRESS NOTE   Diagnosis: Non-Hodgkin's lymphoma  INTERVAL HISTORY:   Amanda Sellers returns as scheduled.  She completed cycle 3 CEOP/Rituxan 01/18/2022.  She denies nausea/vomiting.  No mouth sores.  No diarrhea.  Continued intermittent numbness in the right hand.  She complains of increased left hip pain this week.  No recent falls or injury.  Objective:  Vital signs in last 24 hours:  Blood pressure (!) 123/46, pulse 71, temperature 98.2 F (36.8 C), temperature source Tympanic, resp. rate 18, weight 196 lb 11.2 oz (89.2 kg), SpO2 96 %.    HEENT: No thrush or ulcers. Lymphatics: No right axillary mass.  No palpable left axillary lymph nodes. Resp: Lungs clear bilaterally. Cardio: Regular rate and rhythm. GI: Abdomen soft and nontender. Vascular: No leg edema.  A few superficial abrasions at the right lower leg. Breast: No significant right breast edema.  Lab Results:  Lab Results  Component Value Date   WBC 3.3 (L) 02/08/2022   HGB 10.2 (L) 02/08/2022   HCT 32.7 (L) 02/08/2022   MCV 98.2 02/08/2022   PLT 98 (L) 02/08/2022   NEUTROABS 2.3 02/08/2022    Imaging:  No results found.  Medications: I have reviewed the patient's current medications.  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 Severe microcytic anemia, status post transfusion with  packed red blood cells 07/23/2014   Normal ferritin, low transferrin saturation, "scant" bone marrow iron stores, review of peripheral blood smear consistent with iron deficiency anemia   colonoscopy and upper endoscopy 09/23/2014 3. Exertional dyspnea /orthopnea-most likely secondary to congestive heart failure   4. History of B-12 deficiency   5. Rash over the trunk and extremities 08/18/2014. Appears consistent with a drug rash. Resolved 08/28/2014. Rash 11/18/2014, likely secondary to a contrast allergy  , diagnostic contrast will be listed as an allergy 6. Colonoscopy 06/03/2008. Medium sized internal hemorrhoids.   7. Admission with atrial flutter with a rapid ventricular response 10/15/2014-status post cardioversion 10/16/2014, maintained on     apixaban Repeat cardioversion 11/30/2014   8. Right olecranon skin lesion-likely a lipoma or cyst 9. 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  Large right axillary mass 11/24/2021 CT with numerous bulky matted appearing right axillary lymph nodes, largest discretely measurable node measuring 4.6 x 4.3 cm.  Severe edema of the right breast and right chest wall.  No lymphadenopathy or metastatic disease in the abdomen or pelvis.   Bilateral mammogram and ultrasound right breast 11/29/2021-suspicious mass possibly a replaced lymph node right axilla measuring 4.3 cm.  At least 1 additional abnormal lymph node with cortical thickening identified in the right axilla.  Diffuse right breast skin thickening and increased trabeculation.   Biopsy right axillary node 11/29/2021-high-grade B-cell lymphoma with proliferation rate approaching 100%,  29% of the cells kappa restricted CD10 positive B cells Cycle 1 CEOP-Rituxan 12/06/2021 Cycle 2 CEOP-Rituxan 12/27/2021, Udenyca Cycle 3 CEOP-Rituxan 01/18/2022, Udenyca Cycle 4 CEOP-Rituxan 02/08/2022, Udenyca   11.  Left lower extremity cellulitis 12/21/2021.  Improved since beginning  doxycycline 12/19/2021.  14-day course planned.  Disposition: Amanda Sellers appears stable.  She has completed 3 cycles of CEOP-Rituxan.  She continues to tolerate treatment well.  The right axillary mass continues to be resolved.  Plan to proceed with cycle 4 today as scheduled.  Restaging noncontrast chest CT before next office visit.  CBC and chemistry panel reviewed.  Labs adequate to proceed with treatment.  She has mild thrombocytopenia.  She will contact the office with bleeding.  She will return for follow-up in 3 weeks.  We are available to see her sooner if needed.    Ned Card ANP/GNP-BC   02/08/2022  8:51 AM

## 2022-02-08 NOTE — Progress Notes (Signed)
Patient seen by Ned Card, NP today  Vitals are within treatment parameters.   Labs reviewed by Ned Card, NP and are NOT within treatment parameters (platelets 98).  Per physician team, patient is ready for treatment and there are NO modifications to the treatment plan.

## 2022-02-08 NOTE — Progress Notes (Signed)
Amanda Rams, NP notified that pt ready to start Rituxan. BP at 800 am was 123/46. Now BP machine 113/35 Manual 114/42. Pt is reclined in bed. Ok to treat.

## 2022-02-08 NOTE — Patient Instructions (Signed)
Amanda Sellers   Discharge Instructions: Thank you for choosing [facility] to provide your oncology and hematology care.   If you have a lab appointment with the [facility], please go directly to the [facility] and check in at the registration area.   Wear comfortable clothing and clothing appropriate for easy access to any Portacath or PICC line.   We strive to give you quality time with your provider. You may need to reschedule your appointment if you arrive late (15 or more minutes).  Arriving late affects you and other patients whose appointments are after yours.  Also, if you miss three or more appointments without notifying the office, you may be dismissed from the clinic at the provider's discretion.      For prescription refill requests, have your pharmacy contact our office and allow 72 hours for refills to be completed.    Today you received the following chemotherapy and/or immunotherapy agents Vincristine, Cytoxan, Etoposide, Rituximab-abbs      To help prevent nausea and vomiting after your treatment, we encourage you to take your nausea medication as directed.  BELOW ARE SYMPTOMS THAT SHOULD BE REPORTED IMMEDIATELY: *FEVER GREATER THAN 100.4 F (38 C) OR HIGHER *CHILLS OR SWEATING *NAUSEA AND VOMITING THAT IS NOT CONTROLLED WITH YOUR NAUSEA MEDICATION *UNUSUAL SHORTNESS OF BREATH *UNUSUAL BRUISING OR BLEEDING *URINARY PROBLEMS (pain or burning when urinating, or frequent urination) *BOWEL PROBLEMS (unusual diarrhea, constipation, pain near the anus) TENDERNESS IN MOUTH AND THROAT WITH OR WITHOUT PRESENCE OF ULCERS (sore throat, sores in mouth, or a toothache) UNUSUAL RASH, SWELLING OR PAIN  UNUSUAL VAGINAL DISCHARGE OR ITCHING   Items with * indicate a potential emergency and should be followed up as soon as possible or go to the Emergency Department if any problems should occur.  Please show the CHEMOTHERAPY ALERT CARD or  IMMUNOTHERAPY ALERT CARD at check-in to the Emergency Department and triage nurse.  Should you have questions after your visit or need to cancel or reschedule your appointment, please contact [facility]  Dept: [phone]  and follow the prompts.  Office hours are 8:00 a.m. to 4:30 p.m. Monday - Friday. Please note that voicemails left after 4:00 p.m. may not be returned until the following business day.  We are closed weekends and major holidays. You have access to a nurse at all times for urgent questions. Please call the main number to the clinic Dept: [phone] and follow the prompts.   For any non-urgent questions, you may also contact your provider using MyChart. We now offer e-Visits for anyone 14 and older to request care online for non-urgent symptoms. For details visit [email or web address].   Also download the MyChart app! Go to the app store, search "MyChart", open the app, select [facility], and log in with your MyChart username and password.  Vincristine Injection What is this medication? VINCRISTINE (vin KRIS teen) treats some types of cancer. It works by slowing down the growth of cancer cells. This medicine may be used for other purposes; ask your health care provider or pharmacist if you have questions. COMMON BRAND NAME(S): Oncovin, Vincasar PFS What should I tell my care team before I take this medication? They need to know if you have any of these conditions: Infection Kidney disease Liver disease Low white blood cell levels Lung disease Nervous system disease, such as Charcot-Marie-Tooth (CMT) Recent or ongoing radiation therapy An unusual or allergic reaction to vincristine, other chemotherapy  agents, other medications, foods, dyes, or preservatives Pregnant or trying to get pregnant Breast-feeding How should I use this medication? This medication is infused into a vein. It is given by your care team in a hospital or clinic  setting. Talk to your care team about the use of this medication in children. While it may be given to children for selected conditions, precautions do apply. Overdosage: If you think you have taken too much of this medicine contact a poison control center or emergency room at once. NOTE: This medicine is only for you. Do not share this medicine with others. What if I miss a dose? Keep appointments for follow-up doses. It is important not to miss your dose. Call your care team if you are unable to keep an appointment. What may interact with this medication? Do not take this medication with any of the following: Live virus vaccines This medication may also interact with the following: Medications for fungal infections, such as itraconazole or fluconazole Phenytoin Supplements, such as St. John's wort This list may not describe all possible interactions. Give your health care provider a list of all the medicines, herbs, non-prescription drugs, or dietary supplements you use. Also tell them if you smoke, drink alcohol, or use illegal drugs. Some items may interact with your medicine. What should I watch for while using this medication? Your condition will be monitored carefully while you are receiving this medication. This medication may make you feel generally unwell. This is not uncommon as chemotherapy can affect healthy cells as well as cancer cells. Report any side effects. Continue your course of treatment even though you feel ill unless your care team tells you to stop. You may need blood work while taking this medication. This medication may increase your risk to bruise or bleed. Call your care team if you notice any unusual bleeding. This medication may increase your risk of getting an infection. Call your care team for advice if you get a fever, chills, sore throat, or other symptoms of a cold or flu. Do not treat yourself. Try to avoid being around people who are sick. This medication  will cause constipation. If you do not have a bowel movement for 3 days, call your care team. Call your care team if you are around anyone with measles, chickenpox, or if you develop sores or blisters that do not heal properly. Be careful brushing or flossing your teeth or using a toothpick because you may get an infection or bleed more easily. If you have any dental work done, tell your dentist you are receiving this medication Talk to your care team if you or your partner wish to become pregnant or think either of you might be pregnant. This medication can cause serious birth defects. This medication may cause infertility. Talk to your care team if you are concerned about your fertility. Talk to your care team before breastfeeding. Changes to your treatment plan may be needed. What side effects may I notice from receiving this medication? Side effects that you should report to your care team as soon as possible: Allergic reactions--skin rash, itching, hives, swelling of the face, lips, tongue, or throat High uric acid level--severe pain, redness, warmth, or swelling in joints, pain or trouble passing urine, pain in the lower back or sides Infection--fever, chills, cough, sore throat, wounds that don't heal, pain or trouble when passing urine, general feeling of discomfort or being unwell Pain, tingling, or numbness in the hands or feet, muscle weakness, change in  vision, confusion or trouble speaking, loss of balance or coordination, trouble walking, seizures Painful swelling, warmth, or redness of the skin, blisters or sores at the infusion site Shortness of breath or trouble breathing Side effects that usually do not require medical attention (report to your care team if they continue or are bothersome): Constipation Diarrhea Hair loss Loss of appetite Nausea Stomach cramping Vomiting This list may not describe all possible side effects. Call your doctor for medical advice about side  effects. You may report side effects to FDA at 1-800-FDA-1088. Where should I keep my medication? This medication is given in a hospital or clinic. It will not be stored at home. NOTE: This sheet is a summary. It may not cover all possible information. If you have questions about this medicine, talk to your doctor, pharmacist, or health care provider.  2023 Elsevier/Gold Standard (2021-04-05 00:00:00) Cyclophosphamide Injection What is this medication? CYCLOPHOSPHAMIDE (sye kloe FOSS fa mide) treats some types of cancer. It works by slowing down the growth of cancer cells. This medicine may be used for other purposes; ask your health care provider or pharmacist if you have questions. COMMON BRAND NAME(S): Cyclophosphamide, Cytoxan, Neosar What should I tell my care team before I take this medication? They need to know if you have any of these conditions: Heart disease Irregular heartbeat or rhythm Infection Kidney problems Liver disease Low blood cell levels (white cells, platelets, or red blood cells) Lung disease Previous radiation Trouble passing urine An unusual or allergic reaction to cyclophosphamide, other medications, foods, dyes, or preservatives Pregnant or trying to get pregnant Breast-feeding How should I use this medication? This medication is injected into a vein. It is given by your care team in a hospital or clinic setting. Talk to your care team about the use of this medication in children. Special care may be needed. Overdosage: If you think you have taken too much of this medicine contact a poison control center or emergency room at once. NOTE: This medicine is only for you. Do not share this medicine with others. What if I miss a dose? Keep appointments for follow-up doses. It is important not to miss your dose. Call your care team if you are unable to keep an appointment. What may interact with this medication? Amphotericin B Amiodarone Azathioprine Certain  antivirals for HIV or hepatitis Certain medications for blood pressure, such as enalapril, lisinopril, quinapril Cyclosporine Diuretics Etanercept Indomethacin Medications that relax muscles Metronidazole Natalizumab Tamoxifen Warfarin This list may not describe all possible interactions. Give your health care provider a list of all the medicines, herbs, non-prescription drugs, or dietary supplements you use. Also tell them if you smoke, drink alcohol, or use illegal drugs. Some items may interact with your medicine. What should I watch for while using this medication? This medication may make you feel generally unwell. This is not uncommon as chemotherapy can affect healthy cells as well as cancer cells. Report any side effects. Continue your course of treatment even though you feel ill unless your care team tells you to stop. You may need blood work while you are taking this medication. This medication may increase your risk of getting an infection. Call your care team for advice if you get a fever, chills, sore throat, or other symptoms of a cold or flu. Do not treat yourself. Try to avoid being around people who are sick. Avoid taking medications that contain aspirin, acetaminophen, ibuprofen, naproxen, or ketoprofen unless instructed by your care team. These medications may  hide a fever. Be careful brushing or flossing your teeth or using a toothpick because you may get an infection or bleed more easily. If you have any dental work done, tell your dentist you are receiving this medication. Drink water or other fluids as directed. Urinate often, even at night. Some products may contain alcohol. Ask your care team if this medication contains alcohol. Be sure to tell all care teams you are taking this medicine. Certain medicines, like metronidazole and disulfiram, can cause an unpleasant reaction when taken with alcohol. The reaction includes flushing, headache, nausea, vomiting, sweating, and  increased thirst. The reaction can last from 30 minutes to several hours. Talk to your care team if you wish to become pregnant or think you might be pregnant. This medication can cause serious birth defects if taken during pregnancy and for 1 year after the last dose. A negative pregnancy test is required before starting this medication. A reliable form of contraception is recommended while taking this medication and for 1 year after the last dose. Talk to your care team about reliable forms of contraception. Do not father a child while taking this medication and for 4 months after the last dose. Use a condom during this time period. Do not breast-feed while taking this medication or for 1 week after the last dose. This medication may cause infertility. Talk to your care team if you are concerned about your fertility. Talk to your care team about your risk of cancer. You may be more at risk for certain types of cancer if you take this medication. What side effects may I notice from receiving this medication? Side effects that you should report to your care team as soon as possible: Allergic reactions--skin rash, itching, hives, swelling of the face, lips, tongue, or throat Dry cough, shortness of breath or trouble breathing Heart failure--shortness of breath, swelling of the ankles, feet, or hands, sudden weight gain, unusual weakness or fatigue Heart muscle inflammation--unusual weakness or fatigue, shortness of breath, chest pain, fast or irregular heartbeat, dizziness, swelling of the ankles, feet, or hands Heart rhythm changes--fast or irregular heartbeat, dizziness, feeling faint or lightheaded, chest pain, trouble breathing Infection--fever, chills, cough, sore throat, wounds that don't heal, pain or trouble when passing urine, general feeling of discomfort or being unwell Kidney injury--decrease in the amount of urine, swelling of the ankles, hands, or feet Liver injury--right upper belly pain,  loss of appetite, nausea, light-colored stool, dark yellow or brown urine, yellowing skin or eyes, unusual weakness or fatigue Low red blood cell level--unusual weakness or fatigue, dizziness, headache, trouble breathing Low sodium level--muscle weakness, fatigue, dizziness, headache, confusion Red or dark brown urine Unusual bruising or bleeding Side effects that usually do not require medical attention (report to your care team if they continue or are bothersome): Hair loss Irregular menstrual cycles or spotting Loss of appetite Nausea Pain, redness, or swelling with sores inside the mouth or throat Vomiting This list may not describe all possible side effects. Call your doctor for medical advice about side effects. You may report side effects to FDA at 1-800-FDA-1088. Where should I keep my medication? This medication is given in a hospital or clinic. It will not be stored at home. NOTE: This sheet is a summary. It may not cover all possible information. If you have questions about this medicine, talk to your doctor, pharmacist, or health care provider.  2023 Elsevier/Gold Standard (2021-03-01 00:00:00) Etoposide Injection What is this medication? ETOPOSIDE (e toe POE side)  treats some types of cancer. It works by slowing down the growth of cancer cells. This medicine may be used for other purposes; ask your health care provider or pharmacist if you have questions. COMMON BRAND NAME(S): Etopophos, Toposar, VePesid What should I tell my care team before I take this medication? They need to know if you have any of these conditions: Infection Kidney disease Liver disease Low blood counts, such as low white cell, platelet, red cell counts An unusual or allergic reaction to etoposide, other medications, foods, dyes, or preservatives If you or your partner are pregnant or trying to get pregnant Breastfeeding How should I use this medication? This medication is injected into a vein. It  is given by your care team in a hospital or clinic setting. Talk to your care team about the use of this medication in children. Special care may be needed. Overdosage: If you think you have taken too much of this medicine contact a poison control center or emergency room at once. NOTE: This medicine is only for you. Do not share this medicine with others. What if I miss a dose? Keep appointments for follow-up doses. It is important not to miss your dose. Call your care team if you are unable to keep an appointment. What may interact with this medication? Warfarin This list may not describe all possible interactions. Give your health care provider a list of all the medicines, herbs, non-prescription drugs, or dietary supplements you use. Also tell them if you smoke, drink alcohol, or use illegal drugs. Some items may interact with your medicine. What should I watch for while using this medication? Your condition will be monitored carefully while you are receiving this medication. This medication may make you feel generally unwell. This is not uncommon as chemotherapy can affect healthy cells as well as cancer cells. Report any side effects. Continue your course of treatment even though you feel ill unless your care team tells you to stop. This medication can cause serious side effects. To reduce the risk, your care team may give you other medications to take before receiving this one. Be sure to follow the directions from your care team. This medication may increase your risk of getting an infection. Call your care team for advice if you get a fever, chills, sore throat, or other symptoms of a cold or flu. Do not treat yourself. Try to avoid being around people who are sick. This medication may increase your risk to bruise or bleed. Call your care team if you notice any unusual bleeding. Talk to your care team about your risk of cancer. You may be more at risk for certain types of cancers if you take  this medication. Talk to your care team if you may be pregnant. Serious birth defects can occur if you take this medication during pregnancy and for 6 months after the last dose. You will need a negative pregnancy test before starting this medication. Contraception is recommended while taking this medication and for 6 months after the last dose. Your care team can help you find the option that works for you. If your partner can get pregnant, use a condom during sex while taking this medication and for 4 months after the last dose. Do not breastfeed while taking this medication. This medication may cause infertility. Talk to your care team if you are concerned about your fertility. What side effects may I notice from receiving this medication? Side effects that you should report to your care team  as soon as possible: Allergic reactions--skin rash, itching, hives, swelling of the face, lips, tongue, or throat Infection--fever, chills, cough, sore throat, wounds that don't heal, pain or trouble when passing urine, general feeling of discomfort or being unwell Low red blood cell level--unusual weakness or fatigue, dizziness, headache, trouble breathing Unusual bruising or bleeding Side effects that usually do not require medical attention (report to your care team if they continue or are bothersome): Diarrhea Fatigue Hair loss Loss of appetite Nausea Vomiting This list may not describe all possible side effects. Call your doctor for medical advice about side effects. You may report side effects to FDA at 1-800-FDA-1088. Where should I keep my medication? This medication is given in a hospital or clinic. It will not be stored at home. NOTE: This sheet is a summary. It may not cover all possible information. If you have questions about this medicine, talk to your doctor, pharmacist, or health care provider.  2023 Elsevier/Gold Standard (2007-03-02 00:00:00) Rituximab Injection What is this  medication? RITUXIMAB (ri TUX i mab) treats leukemia and lymphoma. It works by blocking a protein that causes cancer cells to grow and multiply. This helps to slow or stop the spread of cancer cells. It may also be used to treat autoimmune conditions, such as arthritis. It works by slowing down an overactive immune system. It is a monoclonal antibody. This medicine may be used for other purposes; ask your health care provider or pharmacist if you have questions. COMMON BRAND NAME(S): RIABNI, Rituxan, RUXIENCE, truxima What should I tell my care team before I take this medication? They need to know if you have any of these conditions: Chest pain Heart disease Immune system problems Infection, such as chickenpox, cold sores, hepatitis B, herpes Irregular heartbeat or rhythm Kidney disease Low blood counts, such as low white cells, platelets, red cells Lung disease Recent or upcoming vaccine An unusual or allergic reaction to rituximab, other medications, foods, dyes, or preservatives Pregnant or trying to get pregnant Breast-feeding How should I use this medication? This medication is injected into a vein. It is given by a care team in a hospital or clinic setting. A special MedGuide will be given to you before each treatment. Be sure to read this information carefully each time. Talk to your care team about the use of this medication in children. While this medication may be prescribed for children as young as 6 months for selected conditions, precautions do apply. Overdosage: If you think you have taken too much of this medicine contact a poison control center or emergency room at once. NOTE: This medicine is only for you. Do not share this medicine with others. What if I miss a dose? Keep appointments for follow-up doses. It is important not to miss your dose. Call your care team if you are unable to keep an appointment. What may interact with this medication? Do not take this medication  with any of the following: Live vaccines This medication may also interact with the following: Cisplatin This list may not describe all possible interactions. Give your health care provider a list of all the medicines, herbs, non-prescription drugs, or dietary supplements you use. Also tell them if you smoke, drink alcohol, or use illegal drugs. Some items may interact with your medicine. What should I watch for while using this medication? Your condition will be monitored carefully while you are receiving this medication. You may need blood work while taking this medication. This medication can cause serious infusion reactions. To  reduce the risk your care team may give you other medications to take before receiving this one. Be sure to follow the directions from your care team. This medication may increase your risk of getting an infection. Call your care team for advice if you get a fever, chills, sore throat, or other symptoms of a cold or flu. Do not treat yourself. Try to avoid being around people who are sick. Call your care team if you are around anyone with measles, chickenpox, or if you develop sores or blisters that do not heal properly. Avoid taking medications that contain aspirin, acetaminophen, ibuprofen, naproxen, or ketoprofen unless instructed by your care team. These medications may hide a fever. This medication may cause serious skin reactions. They can happen weeks to months after starting the medication. Contact your care team right away if you notice fevers or flu-like symptoms with a rash. The rash may be red or purple and then turn into blisters or peeling of the skin. You may also notice a red rash with swelling of the face, lips, or lymph nodes in your neck or under your arms. In some patients, this medication may cause a serious brain infection that may cause death. If you have any problems seeing, thinking, speaking, walking, or standing, tell your care team right away. If  you cannot reach your care team, urgently seek another source of medical care. Talk to your care team if you may be pregnant. Serious birth defects can occur if you take this medication during pregnancy and for 12 months after the last dose. You will need a negative pregnancy test before starting this medication. Contraception is recommended while taking this medication and for 12 months after the last dose. Your care team can help you find the option that works for you. Do not breastfeed while taking this medication and for at least 6 months after the last dose. What side effects may I notice from receiving this medication? Side effects that you should report to your care team as soon as possible: Allergic reactions or angioedema--skin rash, itching or hives, swelling of the face, eyes, lips, tongue, arms, or legs, trouble swallowing or breathing Bowel blockage--stomach cramping, unable to have a bowel movement or pass gas, loss of appetite, vomiting Dizziness, loss of balance or coordination, confusion or trouble speaking Heart attack--pain or tightness in the chest, shoulders, arms, or jaw, nausea, shortness of breath, cold or clammy skin, feeling faint or lightheaded Heart rhythm changes--fast or irregular heartbeat, dizziness, feeling faint or lightheaded, chest pain, trouble breathing Infection--fever, chills, cough, sore throat, wounds that don't heal, pain or trouble when passing urine, general feeling of discomfort or being unwell Infusion reactions--chest pain, shortness of breath or trouble breathing, feeling faint or lightheaded Kidney injury--decrease in the amount of urine, swelling of the ankles, hands, or feet Liver injury--right upper belly pain, loss of appetite, nausea, light-colored stool, dark yellow or brown urine, yellowing skin or eyes, unusual weakness or fatigue Redness, blistering, peeling, or loosening of the skin, including inside the mouth Stomach pain that is severe,  does not go away, or gets worse Tumor lysis syndrome (TLS)--nausea, vomiting, diarrhea, decrease in the amount of urine, dark urine, unusual weakness or fatigue, confusion, muscle pain or cramps, fast or irregular heartbeat, joint pain Side effects that usually do not require medical attention (report to your care team if they continue or are bothersome): Headache Joint pain Nausea Runny or stuffy nose Unusual weakness or fatigue This list may not describe all  possible side effects. Call your doctor for medical advice about side effects. You may report side effects to FDA at 1-800-FDA-1088. Where should I keep my medication? This medication is given in a hospital or clinic. It will not be stored at home. NOTE: This sheet is a summary. It may not cover all possible information. If you have questions about this medicine, talk to your doctor, pharmacist, or health care provider.  2023 Elsevier/Gold Standard (2021-05-24 00:00:00)

## 2022-02-09 ENCOUNTER — Inpatient Hospital Stay: Payer: PPO

## 2022-02-09 ENCOUNTER — Encounter: Payer: Self-pay | Admitting: *Deleted

## 2022-02-09 ENCOUNTER — Other Ambulatory Visit: Payer: Self-pay | Admitting: Family Medicine

## 2022-02-09 VITALS — BP 133/64 | HR 76 | Temp 98.0°F | Resp 17

## 2022-02-09 DIAGNOSIS — C8514 Unspecified B-cell lymphoma, lymph nodes of axilla and upper limb: Secondary | ICD-10-CM

## 2022-02-09 DIAGNOSIS — Z5112 Encounter for antineoplastic immunotherapy: Secondary | ICD-10-CM | POA: Diagnosis not present

## 2022-02-09 MED ORDER — PEGFILGRASTIM-CBQV 6 MG/0.6ML ~~LOC~~ SOSY
6.0000 mg | PREFILLED_SYRINGE | Freq: Once | SUBCUTANEOUS | Status: AC
  Administered 2022-02-09: 6 mg via SUBCUTANEOUS
  Filled 2022-02-09: qty 0.6

## 2022-02-09 NOTE — Progress Notes (Signed)
Shared with Cristy Friedlander, RN to Dr Benay Spice patient and family interest in home injection of Udneyca or Neulasta On Pro Kit.

## 2022-02-09 NOTE — Progress Notes (Signed)
Patient requesting to administer Udenyca at home or to have the Onpro device for future treatments. Very difficult to get here and she has family member who is RN that could administer. Confirmed w/managed care that Onpro does not require PA. Made MD aware of request.

## 2022-02-09 NOTE — Patient Instructions (Signed)

## 2022-02-15 ENCOUNTER — Ambulatory Visit (INDEPENDENT_AMBULATORY_CARE_PROVIDER_SITE_OTHER): Payer: PPO

## 2022-02-15 DIAGNOSIS — Z0000 Encounter for general adult medical examination without abnormal findings: Secondary | ICD-10-CM

## 2022-02-15 NOTE — Progress Notes (Signed)
Subjective:   Amanda Sellers is a 89 y.o. female who presents for Medicare Annual (Subsequent) preventive examination.  Review of Systems    I connected with  Amanda Sellers on 02/15/22 by an audio only telemedicine application and verified that I am speaking with the correct person using two identifiers.   I discussed the limitations, risks, security and privacy concerns of performing an evaluation and management service by telephone and the availability of in person appointments. I also discussed with the patient that there may be a patient responsible charge related to this service. The patient expressed understanding and verbally consented to this telephonic visit.  Location of Patient: home Location of Provider:office  List any persons and their role that are participating in the visit with the patient.   Delaine Lame (daughter); Enid Derry [name] Cyd Silence, CMA Cardiac Risk Factors include: advanced age (>73mn, >>19women);sedentary lifestyle;obesity (BMI >30kg/m2);dyslipidemia;hypertension     Objective:    There were no vitals filed for this visit. There is no height or weight on file to calculate BMI.     02/15/2022   10:07 AM 02/09/2022    1:32 PM 02/08/2022    8:44 AM 01/18/2022    8:54 AM 12/27/2021    9:12 AM 12/13/2021    8:57 AM 12/06/2021    8:56 AM  Advanced Directives  Does Patient Have a Medical Advance Directive? Yes Yes Yes Yes Yes Yes Yes  Type of ATheatre stage managerof ATign[place]Living will [place]even [place]Living will [place]Living will   Does patient want to make changes to medical advance directive?  No - Patient declined No - Patient declined No - Patient declined No - Patient declined No - Patient declined No - Patient declined  Copy of HNeyin Chart?    Yes - validated most recent copy scanned in chart (See row information)  Yes - validated most recent copy scanned  in chart (See row information)   Would patient like information on creating a medical advance directive? No - Patient declined  No - Patient declined  No - Patient declined      Current Medications (verified) Outpatient Encounter Medications as of 02/15/2022  Medication Sig   amiodarone (PACERONE) 200 MG tablet Take 0.5 tablets (100 mg total) by mouth daily.   apixaban (ELIQUIS) 5 MG TABS tablet TAKE 1 TABLET BY MOUTH TWICE A DAY   cyanocobalamin (,VITAMIN B-12,) 1000 MCG/ML injection INJECT 1 ML INTO THE SKIN EVERY 30 DAYS.   diclofenac Sodium (VOLTAREN) 1 % GEL APPLY 2 G TOPICALLY 4 (FOUR) TIMES DAILY. APPLY TO LE JOINTS THAT ARE IN PAIN.   ENTRESTO 24-26 MG TAKE 1 TABLET BY MOUTH TWICE A DAY   fish oil-omega-3 fatty acids 1000 MG capsule Take 1 g by mouth daily.   fluticasone (CUTIVATE) 0.05 % cream Apply to affected area of lower legs bid prn   furosemide (LASIX) 40 MG tablet Taking 1 tablet by mouth daily and 1/2 tablet in the afternoon   HYDROcodone-acetaminophen (NORCO/VICODIN) 5-325 MG tablet 1-2 tabs po qid prn pain   hydrOXYzine (VISTARIL) 25 MG capsule TAKE 1 TO 2 CAPSULES BY MOUTH AT BEDTIME FOR SLEEP   levothyroxine (SYNTHROID) 112 MCG tablet TAKE 1 TABLET BY MOUTH EVERY DAY   omeprazole (PRILOSEC) 20 MG capsule Take 1 capsule (20 mg total) by mouth daily.   OXYGEN Inhale into the lungs. 1Lt at bedtime  as needed  Potassium Chloride ER 20 MEQ TBCR Take 1 tablet by mouth daily.   predniSONE (DELTASONE) 20 MG tablet Take 3 tablets (60 mg total) by mouth daily with breakfast. Take x 4 days beginning the day after chemotherapy treatment   prochlorperazine (COMPAZINE) 5 MG tablet TAKE 1 TABLET BY MOUTH EVERY 6 HOURS AS NEEDED FOR NAUSEA OR VOMITING.   rosuvastatin (CRESTOR) 20 MG tablet TAKE 1 TABLET BY MOUTH EVERY DAY   Vitamin D, Ergocalciferol, (DRISDOL) 1.25 MG (50000 UNIT) CAPS capsule TAKE 1 CAPSULE (50,000 UNITS TOTAL) BY MOUTH TWO TIMES A WEEK   No facility-administered  encounter medications on file as of 02/15/2022.    Allergies (verified) Aspirin, Calcium alginate, Morphine, and Iodinated contrast media   History: Past Medical History:  Diagnosis Date   Anemia of chronic disease    Anticoagulation adequate, Eliquis 10/16/2014   Anxiety    clonaz helps this AND her breathing   Cholelithiasis without cholecystitis 07/2020   noted on CT abd/pelv at [facility]   Chronic combined systolic and diastolic congestive heart failure ([place]) 43/32/9518   Complicated by atrial fibrillation; Echo 07/23/14: mild LVH, EF 50-55%, Gr 2 DD.;  Echo May 15, 2019: EF 35 to 40%, GRII DD.  (In setting of CHF exacerbation, class III)-> baseline class II symptoms. 09/2019 EF 55-60%, nl wall motion and LV fxn, grd II DD, mod MR.   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  On Amio since 10/2014; Dr. Lenna Gilford following her from pulm standpoint regarding this med.   Positive occult stool blood test 08/08/2014   Endoscopies ok 08/2014   Primary osteoarthritis of both knees 04/2016   Dr. Gladstone Lighter: bone on bone--to get euflexxa to both knees.   Protein calorie  malnutrition ([place]) 2022   Pulmonary hypertension due to left heart disease ([place])    RLCP 05/19/2019: EF 35-40%. D1 50%, RI 70%, dLM-LAD 30%.  mPAP 43 mmHg - (62/19 mmHg), PCWP  28 mmHg (V wave 41 mmHg) LVEDP 34 mmHg   Pulmonary metastases 07/24/2014   S/p left hip fracture 05/2020   ORIF   Venous stasis ulcer (HCC)    Right leg   Vitamin B 12 deficiency    Vitamin D deficiency    dose increased to 50 K U tab TWICE weekly 04/2017   Past Surgical History:  Procedure Laterality Date   ABDOMINAL HYSTERECTOMY  1978   BONE MARROW BIOPSY  07/24/2014   BREAST BIOPSY     CARDIOVERSION N/A 10/16/2014   Procedure: CARDIOVERSION;  Surgeon: Aaron Edelman  Jacalyn Lefevre, MD;  Location: [facility];  Service: Cardiovascular;  Laterality: N/A;   CARDIOVERSION N/A 11/30/2014   Procedure: CARDIOVERSION;  Surgeon: Larey Dresser, MD;  Location: [place];  Service: Cardiovascular;  Laterality: N/A;   CATARACT EXTRACTION  2020   OU: Dr. Katy Fitch   COLONOSCOPY  09/23/2014   small hemorrhoids, otherwise normal (performed for IDA and heme+ stool)   COLONOSCOPY     [place]     May 2022   INTRAMEDULLARY (IM) NAIL INTERTROCHANTERIC Left 05/25/2020   Procedure: INTRAMEDULLARY (IM) NAIL INTERTROCHANTRIC;  Surgeon: Erle Crocker, MD;  Location: [place];  Service: Orthopedics;  Laterality: Left;   MASS EXCISION Left 07/15/2015   Left lower lip mass--invasive SCC w/negative margins.  Procedure: EXCISION MASS;  Surgeon: Melissa Montane, MD;  Location: [place];  Service: ENT;  Laterality: Left;  Wedge excision left lower lip mass   NM MYOVIEW LTD  10/29/2014   medium size mild surgery defect in the mid anterior and apical anterior location suggestive of breast attenuation. No reversibility. 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 D1 50%, RI 70%, dLM-LAD 30%.  mPAP 43 mmHg - (62/19 mmHg), PCWP  28 mmHg (V wave 41 mmHg) LVEDP 34 mmHg   TEE WITHOUT CARDIOVERSION N/A 10/16/2014   Procedure: TRANSESOPHAGEAL ECHOCARDIOGRAM (TEE);  Surgeon: Lelon Perla, MD;  Location: [facility];  Service: Cardiovascular;  Laterality: N/A;   TRANSTHORACIC ECHOCARDIOGRAM  10/09/2019   EF 55 to 60%.  GRII DD.  No R WMA.  Normal PAP and RAP.  Severe LA dilation.  Mild RA dilation.  Moderate MR (likely related to annular dilation in the setting of severe LAE.  Mild aortic valve sclerosis-no stenosis.   TRANSTHORACIC ECHOCARDIOGRAM  05/15/2019   Acute CHF exacerbation:  EF 35-40%.  Moderately reduced function.  (Previous EF was 55 to 60%) global HK.  GRII DD.  Mild LA dilation.  Normal RV size.   UPPER GI ENDOSCOPY  09/23/2014   small hiatus hernia, nodules in stomach biopsied (chronic active erosive atrophic gastritis with intestinal metaplasia--no dysplasia or malignancy) otherwise normal   Family History  Problem Relation Age of [employer]   Melanoma Mother    Stomach cancer Father    Lymphoma Sister    Diabetes Daughter    Social History   Socioeconomic History   Marital status: Widowed    Spouse name: Not on file   Number of children: 8   Years of education: Not on file   Highest education level: Not on file  Occupational History   Occupation: retired  Tobacco Use   Smoking status: Never   Smokeless tobacco: Never  Vaping Use   Vaping Use: Never used  Substance and Sexual Activity   Alcohol use: No    Alcohol/week: 0.0 standard drinks of alcohol   Drug use: No   Sexual activity: Not on file  Other Topics Concern   Not on file  Social History Narrative   Widowed, has 8 children.   Orig from [place], now lives in [place].   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   No tob/alc/drugs.   Tries to walk, but her back starts to hurt.  Has to use a walker.   Social Determinants of Health   Financial Resource Strain: Low Risk  (01/05/2021)   Overall Financial Resource Strain (CARDIA)    Difficulty of Paying Living Expenses: Not hard at all  Food Insecurity: No Food Insecurity (01/27/2022)   Hunger Vital Sign    Worried About Running Out of Food in the Last Year: Never true    Ran Out of Food in the Last Year: Never true  Transportation Needs: No Transportation Needs (01/27/2022)   PRAPARE - Hydrologist (Medical): No    Lack of Transportation (Non-Medical): No  Physical Activity: Insufficiently Active (01/05/2021)   Exercise Vital Sign    Days of Exercise per Week: 5 days    Minutes of Exercise per Session: 10 min  Stress: No Stress Concern Present (01/05/2021)   [place]    Feeling of Stress : Not at all  Social Connections: Moderately Isolated (01/05/2021)   Social Connection and Isolation Panel [NHANES]    Frequency of Communication with Friends and Family: More than three times a week    Frequency of Social Gatherings with Friends and Family: Once a week    Attends Religious Services: More than 4 times per year    Active Member of [employer] or Organizations: No    Attends Archivist Meetings: Never    Marital Status: Widowed    Tobacco Counseling Counseling given: Not Answered   Clinical Intake:     Pain : No/denies pain     Diabetes: No  How often do you need to have someone help you when you read instructions, pamphlets, or other written materials from your doctor or pharmacy?: 1 - Never  Diabetic?no  Interpreter Needed?: No      Activities of Daily Living    02/15/2022   10:02 AM  In your present state of health, do you have any difficulty performing the following activities:   Hearing? 1  Vision? 0  Difficulty concentrating or making decisions? 0  Walking or climbing stairs? 1  Dressing or bathing? 0  Doing errands, shopping? 1  Preparing Food and eating ? Y  Using the Toilet? N  In the past six months, have you accidently leaked urine? Y  Do you have problems with loss of bowel control? Y  Comment right after chemo  Managing your Medications? N  Managing your Finances? N  Housekeeping or managing your Housekeeping? N    Patient Care Team: Tammi Sou, MD as PCP - General (Family Medicine) Leonie Man, MD as PCP - Cardiology (Cardiology) Ladell Pier, MD as Consulting Physician (Oncology) Leonie Man, MD as Consulting Physician (Cardiology) Melissa Montane, MD as Consulting Physician (Otolaryngology) Melissa Montane, MD as Consulting Physician (Otolaryngology) Noralee Space, MD as Consulting Physician (Pulmonary Disease) Sherran Needs, NP as Nurse Practitioner (Nurse Practitioner) Latanya Maudlin, MD as Consulting Physician (Orthopedic Surgery) Erle Crocker, MD as Consulting Physician (Orthopedic Surgery) Linus Mako, MD as Consulting Physician (Family Medicine)  Indicate any recent Medical Services you may have received from other than [facility] providers in the past year (date may be approximate).     Assessment:   This is a routine wellness examination for [place].  Hearing/Vision screen No results found.  Dietary issues and exercise activities discussed: Current Exercise Habits: The  patient does not participate in regular exercise at present, Exercise limited by: None identified   Goals Addressed   None   Depression Screen    02/15/2022   10:06 AM 04/25/2021    2:35 PM 01/05/2021   11:43 AM 12/30/2020   10:02 AM 11/04/2018    9:00 AM 05/07/2017   10:13 AM 02/07/2016   10:19 AM  PHQ 2/9 Scores  PHQ - 2 Score 1 0 0 0 0 0 0  PHQ- 9 Score      1     Fall Risk    02/15/2022   10:05 AM 02/01/2022    9:59 AM  04/25/2021    2:35 PM 01/28/2021    2:27 PM 01/05/2021   11:46 AM  Fall Risk   Falls in the past year? '1 1 1 1 1  '$ Number falls in past yr: '1 1 1 1 1  '$ Injury with Fall? '1 1 1 1 1  '$ Risk for fall due to : History of fall(s);Impaired mobility;Impaired balance/gait History of fall(s);Impaired balance/gait Impaired balance/gait;Impaired mobility;History of fall(s) Impaired balance/gait;Impaired vision;Impaired mobility Impaired mobility;Impaired balance/gait;Impaired vision  Follow up Falls evaluation completed Falls evaluation completed Falls evaluation completed Falls prevention discussed Falls prevention discussed    FALL RISK PREVENTION PERTAINING TO THE HOME:  Any stairs in or around the home? No  If so, are there any without handrails? No  Home free of loose throw rugs in walkways, pet beds, electrical cords, etc? Yes  Adequate lighting in your home to reduce risk of falls? Yes   ASSISTIVE DEVICES UTILIZED TO PREVENT FALLS:  Life alert? No  Use of a cane, walker or w/c? Yes  Grab bars in the bathroom? Yes  Shower chair or bench in shower? Yes  Elevated toilet seat or a handicapped toilet? Yes   TIMED UP AND GO:  Was the test performed? No .  Length of time to ambulate 10 feet: n/a sec.    Cognitive Function:        02/15/2022   10:06 AM 01/05/2021   11:49 AM  6CIT Screen  What Year? 4 points 0 points  What month? 3 points 0 points  What time? 3 points 0 points  Count back from 20 4 points 0 points  Months in reverse 4 points 0 points  Repeat phrase 10 points 2 points  Total Score 28 points 2 points    Immunizations Immunization History  Administered Date(s) Administered   Fluad Quad(high Dose 65+) 10/14/2018, 09/30/2019, 10/06/2020, 10/31/2021   Influenza, High Dose Seasonal PF 11/11/2013, 11/06/2016   Influenza,inj,Quad PF,6+ Mos 10/27/2014, 10/04/2015, 10/05/2017   Influenza-Unspecified 10/21/2012   Moderna Sars-Covid-2 Vaccination 02/04/2019, 03/17/2019,  11/18/2019, 04/21/2020   PNEUMOCOCCAL CONJUGATE-20 09/15/2021   Pneumococcal Conjugate-13 02/07/2016   Pneumococcal-Unspecified 01/23/2014   Tdap 11/16/1995, 12/09/2014   Zoster Recombinat (Shingrix) 07/01/2021, 09/15/2021   Zoster, Live 03/19/2012    TDAP status: Up to date  Flu Vaccine status: Up to date  Pneumococcal vaccine status: Up to date  Covid-19 vaccine status: Completed vaccines  Qualifies for Shingles Vaccine? Yes   Zostavax completed No   Shingrix Completed?: Yes  Screening Tests Health Maintenance  Topic Date Due   COVID-19 Vaccine (5 - 2023-24 season) 02/17/2022 (Originally 09/23/2021)   Medicare Annual Wellness (AWV)  02/16/2023   DTaP/Tdap/Td (3 - Td or Tdap) 12/08/2024   Pneumonia Vaccine 22+ Years old  Completed   INFLUENZA VACCINE  Completed   DEXA SCAN  Completed   Zoster Vaccines- Shingrix  Completed   HPV VACCINES  Aged Out    Health Maintenance  There are no preventive care reminders to display for this patient.   Colorectal cancer screening: No longer required.   Mammogram status: No longer required due to age.  Bone Density status: Completed 12/12/2005. Results reflect: Bone density results: OSTEOPENIA. Repeat every 2 years.  Lung Cancer Screening: (Low Dose CT Chest recommended if Age 76-80 years, 30 pack-year currently smoking OR have quit w/in 15years.) does not qualify.   Lung Cancer Screening Referral: n/a  Additional Screening:  Hepatitis C Screening: does not qualify; Completed n/a  Vision Screening: Recommended annual ophthalmology exams for early detection of glaucoma and other disorders of the eye. Is the patient up to date with their annual eye exam?  No  Who is the provider or what is the name of the office in which the patient attends annual eye exams? N/a If pt is not established with a provider, would they like to be referred to a provider to establish care? No .   Dental Screening: Recommended annual dental exams for  proper oral hygiene  Community Resource Referral / Chronic Care Management: CRR required this visit?  No   CCM required this visit?  No      Plan:     I have personally reviewed and noted the following in the patient's chart:   Medical and social history Use of alcohol, tobacco or illicit drugs  Current medications and supplements including opioid prescriptions. Patient is currently taking opioid prescriptions. Information provided to patient regarding non-opioid alternatives. Patient advised to discuss non-opioid treatment plan with their provider. Functional ability and status Nutritional status Physical activity Advanced directives List of other physicians Hospitalizations, surgeries, and ER visits in previous 12 months Vitals Screenings to include cognitive, depression, and falls Referrals and appointments  In addition, I have reviewed and discussed with patient certain preventive protocols, quality metrics, and best practice recommendations. A written personalized care plan for preventive services as well as general preventive health recommendations were provided to patient.     Beatrix Fetters, [place]   02/15/2022   Nurse Notes: Non-Face to Face or Face to Face 10 minute visit Encounter  Ms. Colella , Thank you for taking time to come for your Medicare Wellness Visit. I appreciate your ongoing commitment to your health goals. Please review the following plan we discussed and let me know if I can assist you in the future.   These are the goals we discussed:  Goals      <enter goal here>     Would like to maintain current health status by remaining active.      Patient Stated     Get back to walking with walker         This is a list of the screening recommended for you and due dates:  Health Maintenance  Topic Date Due   COVID-19 Vaccine (5 - 2023-24 season) 02/17/2022*   Medicare Annual Wellness Visit  02/16/2023   DTaP/Tdap/Td vaccine (3 - Td or Tdap)  12/08/2024   Pneumonia Vaccine  Completed   Flu Shot  Completed   DEXA scan (bone density measurement)  Completed   Zoster (Shingles) Vaccine  Completed   HPV Vaccine  Aged Out  *Topic was postponed. The date shown is not the original due date.

## 2022-02-15 NOTE — Patient Instructions (Signed)

## 2022-02-16 ENCOUNTER — Other Ambulatory Visit: Payer: Self-pay

## 2022-02-20 ENCOUNTER — Ambulatory Visit: Payer: PPO | Attending: Cardiology | Admitting: Cardiology

## 2022-02-20 ENCOUNTER — Encounter: Payer: Self-pay | Admitting: Cardiology

## 2022-02-20 VITALS — BP 146/67 | HR 63 | Ht 66.0 in | Wt 196.0 lb

## 2022-02-20 DIAGNOSIS — I2510 Atherosclerotic heart disease of native coronary artery without angina pectoris: Secondary | ICD-10-CM | POA: Diagnosis not present

## 2022-02-20 DIAGNOSIS — I5032 Chronic diastolic (congestive) heart failure: Secondary | ICD-10-CM | POA: Diagnosis not present

## 2022-02-20 DIAGNOSIS — I480 Paroxysmal atrial fibrillation: Secondary | ICD-10-CM

## 2022-02-20 DIAGNOSIS — Z79899 Other long term (current) drug therapy: Secondary | ICD-10-CM

## 2022-02-20 DIAGNOSIS — Z7901 Long term (current) use of anticoagulants: Secondary | ICD-10-CM | POA: Diagnosis not present

## 2022-02-20 DIAGNOSIS — I447 Left bundle-branch block, unspecified: Secondary | ICD-10-CM | POA: Diagnosis not present

## 2022-02-20 DIAGNOSIS — R609 Edema, unspecified: Secondary | ICD-10-CM

## 2022-02-20 DIAGNOSIS — I10 Essential (primary) hypertension: Secondary | ICD-10-CM

## 2022-02-20 NOTE — Progress Notes (Unsigned)
Primary Care Provider: Tammi Sou, MD [place] Cardiologist: Glenetta Hew, MD Electrophysiologist: None  Clinic Note: No chief complaint on file.   ===================================  ASSESSMENT/PLAN   Problem List Items Addressed This Visit   None   ===================================  HPI:    Amanda Sellers is a 89 y.o. female with a PMH notable for labile HTN, PAF (on amiodarone), HFpEF, COPD (nightly oxygen) who presents today for essentially 2-year follow-up with primary cardiologist..  I last saw Amanda Sellers in February 2022.  Amanda Sellers was last seen on January 26, 2022 in the A-fib clinic.  No awareness of any A-fib.  Was not back on chemotherapy for treatment of non-Hodgkin's B-cell lymphoma for the previous [place] remission.  Seem to be tolerating milligrams daily amiodarone.  No palpitations.  N chest pain or pressure with exertion.  No PND orthopnea.  Baseline LE edema. sessment and Plan: 1 Afib Maintaining SR on 100 mg amiodarone  TSH stable per PCP checked last in October Off BB for some time now  for bradycardia Continue apixaban for chadsvasc score of at least 4  Continue yearly PFT's  thru  pulmonology    2. Diastolic heart failure LLE chronic and stable  Weight  stable  Continue  lasix  as prescribed  Continue  entresto   Avoid salt    3. HTN Stable    4. Non Hodgkns B cell lymphoma Undergoing  chemotherapy Per oncology  Recent Hospitalizations: none  Reviewed  CV studies:    The following studies were reviewed today: (if available, images/films reviewed: From Epic Chart or Care Everywhere) none:  Interval History:   Amanda Sellers   CV Review of Symptoms (Summary): Cardiovascular ROS: {roscv:310661}  REVIEWED OF SYSTEMS   ROS  I have reviewed and (if needed) personally updated the patient's problem list, medications, allergies, past medical and surgical history, social and family history.   PAST  MEDICAL HISTORY   Past Medical History:  Diagnosis Date   Anemia of chronic disease    Anticoagulation adequate, Eliquis 10/16/2014   Anxiety    clonaz helps this AND her breathing   Cholelithiasis without cholecystitis 07/2020   noted on CT abd/pelv at [facility]   Chronic combined systolic and diastolic congestive heart failure ([place]) 09/38/1829   Complicated by atrial fibrillation; Echo 07/23/14: mild LVH, EF 50-55%, Gr 2 DD.;  Echo May 15, 2019: EF 35 to 40%, GRII DD.  (In setting of CHF exacerbation, class III)-> baseline class II symptoms. 09/2019 EF 55-60%, nl wall motion and LV fxn, grd II DD, mod MR.   Chronic renal insufficiency, stage III (moderate) (HCC)    GFR @ 50   COPD (chronic obstructive pulmonary disease) ([place])    recently noted on Xray, does not have any problems   Diverticulosis    Follicular lymphoma ([place])    2016 Non Hodgkins B cell lymphoma; s/p chemo summer 2016.  Remission until R axillary recurrence 11/2021-->chemo   Hiatal hernia 07/2020   moderate size-noted on CT abd/pelv at [place] (hyperlipidemia)     06/2014   HTN (hypertension)    Hypothyroidism    IFG (impaired fasting glucose)    Lip cancer    Dr. Janace Hoard excised this: invasive SCC--no sign of cancer at ENT f/u 10/2015   Lymphedema    R>>L   Microcytic anemia    transfused 3 U total in hosp 07/2014   Obesity (BMI 30-39.9)  Pancytopenia due to antineoplastic chemotherapy ([place])    Persistent atrial fibrillation ([place])    With RVR; elec cardioversion 11/30/14.  On Amio since 10/2014; Dr. Lenna Gilford following her from pulm standpoint regarding this med.   Positive occult stool blood test 08/08/2014   Endoscopies ok 08/2014   Primary osteoarthritis of both knees 04/2016   Dr. Gladstone Lighter: bone on bone--to get euflexxa to both knees.   Protein calorie malnutrition ([place]) 2022   Pulmonary hypertension due to left heart disease ([place])    RLCP 05/19/2019: EF 35-40%. D1 50%, RI 70%,  dLM-LAD 30%.  mPAP 43 mmHg - (62/19 mmHg), PCWP  28 mmHg (V wave 41 mmHg) LVEDP 34 mmHg   Pulmonary metastases 07/24/2014   S/p left hip fracture 05/2020   ORIF   Venous stasis ulcer (HCC)    Right leg   Vitamin B 12 deficiency    Vitamin D deficiency    dose increased to 50 K U tab TWICE weekly 04/2017    PAST SURGICAL HISTORY   Past Surgical History:  Procedure Laterality Date   ABDOMINAL HYSTERECTOMY  1978   BONE MARROW BIOPSY  07/24/2014   BREAST BIOPSY     CARDIOVERSION N/A 10/16/2014   Procedure: CARDIOVERSION;  Surgeon: Lelon Perla, MD;  Location: [facility] ENDOSCOPY;  Service: Cardiovascular;  Laterality: N/A;   CARDIOVERSION N/A 11/30/2014   Procedure: CARDIOVERSION;  Surgeon: Larey Dresser, MD;  Location: [facility];  Service: Cardiovascular;  Laterality: N/A;   CATARACT EXTRACTION  2020   OU: Dr. Katy Fitch   COLONOSCOPY  09/23/2014   small hemorrhoids, otherwise normal (performed for IDA and heme+ stool)   COLONOSCOPY     [place]     May 2022   INTRAMEDULLARY (IM) NAIL INTERTROCHANTERIC Left 05/25/2020   Procedure: INTRAMEDULLARY (IM) NAIL INTERTROCHANTRIC;  Surgeon: Erle Crocker, MD;  Location: [facility];  Service: Orthopedics;  Laterality: Left;   MASS EXCISION Left 07/15/2015   Left lower lip mass--invasive SCC w/negative margins.  Procedure: EXCISION MASS;  Surgeon: Melissa Montane, MD;  Location: [facility];  Service: ENT;  Laterality: Left;  Wedge excision left lower lip mass   NM MYOVIEW LTD  10/29/2014   medium size mild surgery defect in the mid anterior and apical anterior location suggestive of breast attenuation. No reversibility. LOW RISK   PFTs  02/2015   Restriction with diffusion defect: cardiologist referred her to pulm to help interpret PFTs and decide whether she has amiodarone toxicity   RIGHT/LEFT HEART CATH AND CORONARY ANGIOGRAPHY N/A 05/19/2019   Procedure: RIGHT/LEFT HEART CATH AND CORONARY ANGIOGRAPHY;  Surgeon:  Troy Sine, MD;  Location: [facility] CV LAB;; EF 35-40%. D1 50%, RI 70%, dLM-LAD 30%.  mPAP 43 mmHg - (62/19 mmHg), PCWP  28 mmHg (V wave 41 mmHg) LVEDP 34 mmHg   TEE WITHOUT CARDIOVERSION N/A 10/16/2014   Procedure: TRANSESOPHAGEAL ECHOCARDIOGRAM (TEE);  Surgeon: Lelon Perla, MD;  Location: [facility] ENDOSCOPY;  Service: Cardiovascular;  Laterality: N/A;   TRANSTHORACIC ECHOCARDIOGRAM  10/09/2019   EF 55 to 60%.  GRII DD.  No R WMA.  Normal PAP and RAP.  Severe LA dilation.  Mild RA dilation.  Moderate MR (likely related to annular dilation in the setting of severe LAE.  Mild aortic valve sclerosis-no stenosis.   TRANSTHORACIC ECHOCARDIOGRAM  05/15/2019   Acute CHF exacerbation:  EF 35-40%.  Moderately reduced function.  (Previous EF was 55 to 60%) global HK.  GRII  DD.  Mild LA dilation.  Normal RV size.   UPPER GI ENDOSCOPY  09/23/2014   small hiatus hernia, nodules in stomach biopsied (chronic active erosive atrophic gastritis with intestinal metaplasia--no dysplasia or malignancy) otherwise normal    Immunization History  Administered Date(s) Administered   Fluad Quad(high Dose 65+) 10/14/2018, 09/30/2019, 10/06/2020, 10/31/2021   Influenza, High Dose Seasonal PF 11/11/2013, 11/06/2016   Influenza,inj,Quad PF,6+ Mos 10/27/2014, 10/04/2015, 10/05/2017   Influenza-Unspecified 10/21/2012   Moderna Sars-Covid-2 Vaccination 02/04/2019, 03/17/2019, 11/18/2019, 04/21/2020   PNEUMOCOCCAL CONJUGATE-20 09/15/2021   Pneumococcal Conjugate-13 02/07/2016   Pneumococcal-Unspecified 01/23/2014   Tdap 11/16/1995, 12/09/2014   Zoster Recombinat (Shingrix) 07/01/2021, 09/15/2021   Zoster, Live 03/19/2012    MEDICATIONS/ALLERGIES   Current Meds    Medication Sig   amiodarone (PACERONE) 200 MG tablet Take 0.5 tablets (100 mg total) by mouth daily.   apixaban (ELIQUIS) 5 MG TABS tablet TAKE 1 TABLET BY MOUTH TWICE A DAY   cyanocobalamin (,VITAMIN B-12,) 1000 MCG/ML injection INJECT 1 ML INTO  THE SKIN EVERY 30 DAYS.   diclofenac Sodium (VOLTAREN) 1 % GEL APPLY 2 G TOPICALLY 4 (FOUR) TIMES DAILY. APPLY TO LE JOINTS THAT ARE IN PAIN.   ENTRESTO 24-26 MG TAKE 1 TABLET BY MOUTH TWICE A DAY   fish oil-omega-3 fatty acids 1000 MG capsule Take 1 g by mouth daily.   fluticasone (CUTIVATE) 0.05 % cream Apply to affected area of lower legs bid prn   furosemide (LASIX) 40 MG tablet Taking 1 tablet by mouth daily and 1/2 tablet in the afternoon   HYDROcodone-acetaminophen (NORCO/VICODIN) 5-325 MG tablet 1-2 tabs po qid prn pain   hydrOXYzine (VISTARIL) 25 MG capsule TAKE 1 TO 2 CAPSULES BY MOUTH AT BEDTIME FOR SLEEP   levothyroxine (SYNTHROID) 112 MCG tablet TAKE 1 TABLET BY MOUTH EVERY DAY   omeprazole (PRILOSEC) 20 MG capsule Take 1 capsule (20 mg total) by mouth daily.   OXYGEN Inhale into the lungs. 1Lt at bedtime  as needed   Potassium Chloride ER 20 MEQ TBCR Take 1 tablet by mouth daily.   predniSONE (DELTASONE) 20 MG tablet Take 3 tablets (60 mg total) by mouth daily with breakfast. Take x 4 days beginning the day after chemotherapy treatment   prochlorperazine (COMPAZINE) 5 MG tablet TAKE 1 TABLET BY MOUTH EVERY 6 HOURS AS NEEDED FOR NAUSEA OR VOMITING.   rosuvastatin (CRESTOR) 20 MG tablet TAKE 1 TABLET BY MOUTH EVERY DAY   Vitamin D, Ergocalciferol, (DRISDOL) 1.25 MG (50000 UNIT) CAPS capsule TAKE 1 CAPSULE (50,000 UNITS TOTAL) BY MOUTH TWO TIMES A WEEK    Allergies  Allergen Reactions   Aspirin Itching   Calcium Alginate Other (See Comments)    Excessive burning   Morphine     Other reaction(s): Hallucinations   Iodinated Contrast Media Rash and Other (See Comments)    CT Contrast    SOCIAL HISTORY/FAMILY HISTORY   Reviewed in Epic:  Pertinent findings:  Social History   Tobacco Use   Smoking status: Never   Smokeless tobacco: Never  Vaping Use   Vaping Use: Never used  Substance Use Topics   Alcohol use: No    Alcohol/week: 0.0 standard drinks of alcohol   Drug  use: No   Social History   Social History Narrative   Widowed, has 8 children.   Orig from [place], now lives in [place].   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   No tob/alc/drugs.  Tries to walk, but her back starts to hurt.  Has to use a walker.    OBJCTIVE -PE, EKG, labs   Wt Readings from Last 3 Encounters:  02/20/22 196 lb (88.9 kg)  02/08/22 196 lb 11.2 oz (89.2 kg)  02/01/22 199 lb 6.4 oz (90.4 kg)    Physical Exam: BP (!) 146/67   Pulse 63   Ht '5\' 6"'$  (1.676 m)   Wt 196 lb (88.9 kg)   SpO2 94%   BMI 31.64 kg/m  Physical Exam   Adult ECG Report  Rate: *** ;  Rhythm: {rhythm:17366};   Narrative Interpretation: ***  Recent Labs:  ***  Lab Results  Component Value Date   CHOL 176 02/23/2020   HDL 71.00 02/23/2020   LDLCALC 74 02/23/2020   LDLDIRECT 106.0 05/17/2018   TRIG 158.0 (H) 02/23/2020   CHOLHDL 2 02/23/2020   Lab Results  Component Value Date   CREATININE 1.15 (H) 02/08/2022   BUN 26 (H) 02/08/2022   NA 142 02/08/2022   K 3.9 02/08/2022   CL 102 02/08/2022   CO2 30 02/08/2022      Latest Ref Rng & Units 02/08/2022    8:23 AM 01/18/2022    8:29 AM 12/27/2021    8:23 AM  CBC  WBC 4.0 - 10.5 K/uL 3.3  2.7  2.8   Hemoglobin 12.0 - 15.0 g/dL 10.2  9.8  11.6   Hematocrit 36.0 - 46.0 % 32.7  31.5  35.8   Platelets 150 - 400 K/uL 98  130  122     Lab Results  Component Value Date   HGBA1C 5.5 02/23/2020   Lab Results  Component Value Date   TSH 9.78 (H) 02/01/2022    ================================================== I spent a total of ***minutes with the patient spent in direct patient consultation.  Additional time spent with chart review  / charting (studies, outside notes, etc): *** min Total Time: *** min  Current medicines are reviewed at length with the patient today.  (+/- concerns) ***  Notice: This dictation was prepared with Dragon dictation along with smart phrase technology.  Any transcriptional errors that result from this process are unintentional and may not be corrected upon review.  Studies Ordered:   No orders of the defined types were placed in this encounter.  No orders of the defined types were placed in this encounter.   Patient Instructions / Medication Changes & Studies & Tests Ordered   There are no Patient Instructions on file for this visit.     Leonie Man, MD, MS Glenetta Hew, M.D., M.S. Interventional Cardiologist  [place]  Pager # [phone] Phone # [phone] [address]. [place], [place] [address]   Thank you for choosing [facility] at [facility]!!

## 2022-02-20 NOTE — Patient Instructions (Addendum)
Medication Instructions:  No changes  *If you need a refill on your cardiac medications before your next appointment, please call your pharmacy*   Lab Work: Next time you have lab work at the cancer center please take the labslip with you  CRP Sed rate If you have labs (blood work) drawn today and your tests are completely normal, you will receive your results only by: [place] (if you have MyChart) OR A paper copy in the mail If you have any lab test that is abnormal or we need to change your treatment, we will call you to review the results.   Testing/Procedures: Will be schedule at [facility] has requested that you have an echocardiogram. Echocardiography is a painless test that uses sound waves to create images of your heart. It provides your doctor with information about the size and shape of your heart and how well your heart's chambers and valves are working. This procedure takes approximately one hour. There are no restrictions for this procedure. Please do NOT wear cologne, perfume, aftershave, or lotions (deodorant is allowed). Please arrive 15 minutes prior to your appointment time.    Follow-Up: At [facility], you and your health needs are our priority.  As part of our continuing mission to provide you with exceptional heart care, we have created designated Provider Care Teams.  These Care Teams include your primary Cardiologist (physician) and Advanced Practice Providers (APPs -  Physician Assistants and Nurse Practitioners) who all work together to provide you with the care you need, when you need it.     Your next appointment:   6 month(s)  The format for your next appointment:   In Person  Provider:   Diona Browner, NP    Then, Glenetta Hew, MD will plan to see you again in 12 month(s).   Other Instructions   Try elevated your feet daily 15 to 20 minutes a day   Try wear your  compression boot for 15  to 20 mutes  a day

## 2022-02-21 NOTE — Progress Notes (Incomplete)
Primary Care Provider: Tammi Sou, MD [place] Cardiologist: Glenetta Hew, MD Electrophysiologist: None  Clinic Note: No chief complaint on file.   ===================================  ASSESSMENT/PLAN   Problem List Items Addressed This Visit       Cardiology Problems   Paroxysmal atrial fibrillation ([place]); CHA2DS2Vasc 6 - Eliquis; Amio 100 mg daily (Chronic)   Relevant Orders   EKG 12-Lead (Completed)   ECHOCARDIOGRAM COMPLETE   C-reactive protein   Sedimentation rate   Coronary artery disease involving native coronary artery of native heart without angina pectoris (Chronic)   Relevant Orders   EKG 12-Lead (Completed)   C-reactive protein   Sedimentation rate   Hypertension (Chronic)   Chronic diastolic CHF (congestive heart failure) (HCC) - Primary (Chronic)   Relevant Orders   EKG 12-Lead (Completed)   C-reactive protein   Sedimentation rate   LBBB (left bundle branch block) (Chronic)   Relevant Orders   ECHOCARDIOGRAM COMPLETE    ===================================  HPI:    Amanda Sellers is a 89 y.o. female with a PMH notable for labile HTN, PAF (on amiodarone), HFpEF, COPD (nightly oxygen) who presents today for essentially 2-year follow-up with primary cardiologist..  PAF - Amiodarone; Eliquis Initial diagnosis was September 2016 while on chemotherapy.  Had recurrent A-fib following DCCV. Amiodarone reduced back to 100 mg daily in February 2023 at [facility] clinic for bradycardia.  Beta-blocker stopped PCP is monitoring thyroid levels-is on levothyroxine. Labile HTN  HFpEF (EF improved from 35-40% 04/2019 in setting of volume overload to 55-60% 09/2019) Chronic venous hypertension => has had ulcers and inflammation. 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 => Port-A-Cath placed.  Started on 12/06/2021 with  I last saw Lavine in February 2022: Doing well from cardiac standpoint.  Balance issues limit her walking.  A little dyspneic with walking fast or going up inclines.  No real PND or orthopnea.  Stable edema.  Not wearing compression hose routinely.  No sensation of irregular heartbeats.  Otherwise negative CV ROS.  [facility] February 17, 2021 => fall & landed on R Hip - NO fracture, but was sent to rehab center.  She is stated that they "almost killed her".  Lasix was held and she had weeping legs. R>L.  Superficial ulcerations noted.  (Falls vein and vascular).  Has never really walked since that fall.  Legs are too weak.  Mostly wheelchair-bound.  Amanda Sellers was last seen on January 26, 2022 in the A-fib clinic.  No awareness of any A-fib.  Was not back on chemotherapy for treatment of non-Hodgkin's B-cell lymphoma for the previous [place] remission.  Seem to be tolerating milligrams daily amiodarone.  No palpitations.  N chest pain or pressure with exertion.  No PND orthopnea.  Baseline LE edema. Maintaining sinus rhythm on 100 mg daily amiodarone without beta-blocker because of bradycardia.  TSH stable.  CHA2DS2-VASc score ~4, on Eliquis. => Annual PFTs with pulmonology. Stable HFpEF with lower extremity edema.  Stable weights.  On stable dose of Entresto.  Taking 40 mg Lasix in the a.m. and 20 in the p.m. with PRN additional 20.  Recent Hospitalizations: none  Reviewed  CV studies:    The following studies were reviewed today: (  if available, images/films reviewed: From Epic Chart or Care Everywhere) none:  Interval History:   Amanda Sellers returns today for what amounts to be a 2-year follow-up.  Thankfully, she at least been seen by Roderic Palau, NP in the A-fib clinic (see  above).  Adisynn has aged dramatically since I saw her last.  She is basically wheelchair-bound now.  Profoundly weak and frail.  She has not ever really started walking again since her fall.  She has her chronic venous stasis issues with edema.  She does not routinely use her compression boots, and cannot put on compression socks.  She has been troubled by off-and-on episodes of cellulitis and borderline sepsis.  Swelling is pretty much stable at this point taking a total of 60 mg Lasix daily and rarely using additional half a tablet (20 mg).  She is taking 40 mg in the morning and 20 mg in the evening most days.  She does not sleep in the bed, sleeps in a chair and has done so for years now.  Not necessarily related to orthopnea, more related to ease of getting back up.  Unfortunately, to complicate matters she was diagnosed with yet again another NHL-B-cell lymphoma, and is back again on chemotherapy.  This is not helping her energy level very much at all.  Thankfully, she denies any symptoms to suggest recurrence of A-fib.  Tolerating amiodarone relatively well.  No major bleeding issues.  No significant anemia although we will need to watch for anemia since she had anemia during her last chemotherapy episodes in 2016.  At this point I think it would be fine to hold Eliquis if necessary.  She really does not exert herself very much so would not build determine if she does have any exertional dyspnea.  She does not have any chest pain or pressure or significant dyspnea when she transfers from the wheelchair to bed, or to the toilet.  It does wear a little bit.  Usually worse surrounding her chemotherapy.  No syncope or near syncope.  No TIA or amaurosis fugax.  No melena, hematochezia hematuria or epistaxis.  REVIEWED OF SYSTEMS   Review of Systems  Constitutional:  Positive for malaise/fatigue. Negative for weight loss.  Respiratory:  Negative for cough.   Cardiovascular:  Positive for leg  swelling.  Gastrointestinal:  Negative for abdominal pain, blood in stool, heartburn and melena.  Genitourinary:  Positive for frequency. Negative for dysuria and hematuria.  Musculoskeletal:  Positive for joint pain.  Neurological:  Positive for dizziness and weakness (Generalized.  Her legs are significantly weak.  Cannot really hold herself up.). Negative for focal weakness.  Psychiatric/Behavioral:  Negative for depression (In remarkably good spirits despite everything going on.) and memory loss (She probably does have some memory loss but seems pretty sharp today.). The patient is not nervous/anxious.    I have reviewed and (if needed) personally updated the patient's problem list, medications, allergies, past medical and surgical history, social and family history.   PAST MEDICAL HISTORY   Past Medical History:  Diagnosis Date  . Anemia of chronic disease   . Anticoagulation adequate, Eliquis 10/16/2014  . Anxiety    clonaz helps this AND her breathing  . Cholelithiasis without cholecystitis 07/2020   noted on CT abd/pelv at [facility]  . Chronic combined systolic and diastolic congestive heart failure ([place]) 16/10/9602   Complicated by atrial fibrillation; Echo 07/23/14: mild LVH, EF 50-55%, Gr 2 DD.;  Echo May 15, 2019: EF 35  to 40%, GRII DD.  (In setting of CHF exacerbation, class III)-> baseline class II symptoms. 09/2019 EF 55-60%, nl wall motion and LV fxn, grd II DD, mod MR.  . Chronic renal insufficiency, stage III (moderate) (HCC)    GFR @ 50  . COPD (chronic obstructive pulmonary disease) ([place])    recently noted on Xray, does not have any problems  . Diverticulosis   . Follicular lymphoma ([place])    2016 Non Hodgkins B cell lymphoma; s/p chemo summer 2016.  Remission until R axillary recurrence 11/2021-->chemo  . Hiatal hernia 07/2020   moderate size-noted on CT abd/pelv at [facility]  . HLD (hyperlipidemia)     06/2014  . HTN (hypertension)   .  Hypothyroidism   . IFG (impaired fasting glucose)   . Lip cancer    Dr. Janace Hoard excised this: invasive SCC--no sign of cancer at ENT f/u 10/2015  . Lymphedema    R>>L  . Microcytic anemia    transfused 3 U total in hosp 07/2014  . Obesity (BMI 30-39.9)   . Pancytopenia due to antineoplastic chemotherapy ([place])   . Persistent atrial fibrillation (HCC)    With RVR; elec cardioversion 11/30/14.  On Amio since 10/2014; Dr. Lenna Gilford following her from pulm standpoint regarding this med.  Marland Kitchen Positive occult stool blood test 08/08/2014   Endoscopies ok 08/2014  . Primary osteoarthritis of both knees 04/2016   Dr. Gladstone Lighter: bone on bone--to get euflexxa to both knees.  . Protein calorie malnutrition ([place]) 2022  . Pulmonary hypertension due to left heart disease ([place])    RLCP 05/19/2019: EF 35-40%. D1 50%, RI 70%, dLM-LAD 30%.  mPAP 43 mmHg - (62/19 mmHg), PCWP  28 mmHg (V wave 41 mmHg) LVEDP 34 mmHg  . Pulmonary metastases 07/24/2014  . S/p left hip fracture 05/2020   ORIF  . Venous stasis ulcer (HCC)    Right leg  . Vitamin B 12 deficiency   . Vitamin D deficiency    dose increased to 50 K U tab TWICE weekly 04/2017    PAST SURGICAL HISTORY   Past Surgical History:  Procedure Laterality Date  . ABDOMINAL HYSTERECTOMY  1978  . BONE MARROW BIOPSY  07/24/2014  . BREAST BIOPSY    . CARDIOVERSION N/A 10/16/2014   Procedure: CARDIOVERSION;  Surgeon: Lelon Perla, MD;  Location: [facility] ENDOSCOPY;  Service: Cardiovascular;  Laterality: N/A;  . CARDIOVERSION N/A 11/30/2014   Procedure: CARDIOVERSION;  Surgeon: Larey Dresser, MD;  Location: [place];  Service: Cardiovascular;  Laterality: N/A;  . CATARACT EXTRACTION  2020   OU: Dr. Katy Fitch  . COLONOSCOPY  09/23/2014   small hemorrhoids, otherwise normal (performed for IDA and heme+ stool)  . COLONOSCOPY    . FRACTURE SURGERY    . HIP FRACTURE SURGERY     May 2022  . INTRAMEDULLARY (IM) NAIL INTERTROCHANTERIC Left 05/25/2020   Procedure:  INTRAMEDULLARY (IM) NAIL INTERTROCHANTRIC;  Surgeon: Erle Crocker, MD;  Location: [place];  Service: Orthopedics;  Laterality: Left;  Marland Kitchen MASS EXCISION Left 07/15/2015   Left lower lip mass--invasive SCC w/negative margins.  Procedure: EXCISION MASS;  Surgeon: Melissa Montane, MD;  Location: [place];  Service: ENT;  Laterality: Left;  Wedge excision left lower lip mass  . NM MYOVIEW LTD  10/29/2014   medium size mild surgery defect in the mid anterior and apical anterior location suggestive of breast attenuation. No reversibility. LOW RISK  . PFTs  02/2015   Restriction with diffusion  defect: cardiologist referred her to pulm to help interpret PFTs and decide whether she has amiodarone toxicity  . RIGHT/LEFT HEART CATH AND CORONARY ANGIOGRAPHY N/A 05/19/2019   Procedure: RIGHT/LEFT HEART CATH AND CORONARY ANGIOGRAPHY;  Surgeon: Troy Sine, MD;  Location: [place] CV LAB;; EF 35-40%. D1 50%, RI 70%, dLM-LAD 30%.  mPAP 43 mmHg - (62/19 mmHg), PCWP  28 mmHg (V wave 41 mmHg) LVEDP 34 mmHg  . TEE WITHOUT CARDIOVERSION N/A 10/16/2014   Procedure: TRANSESOPHAGEAL ECHOCARDIOGRAM (TEE);  Surgeon: Lelon Perla, MD;  Location: [facility] ENDOSCOPY;  Service: Cardiovascular;  Laterality: N/A;  . TRANSTHORACIC ECHOCARDIOGRAM  10/09/2019   EF 55 to 60%.  GRII DD.  No R WMA.  Normal PAP and RAP.  Severe LA dilation.  Mild RA dilation.  Moderate MR (likely related to annular dilation in the setting of severe LAE.  Mild aortic valve sclerosis-no stenosis.  . TRANSTHORACIC ECHOCARDIOGRAM  05/15/2019   Acute CHF exacerbation:  EF 35-40%.  Moderately reduced function.  (Previous EF was 55 to 60%) global HK.  GRII DD.  Mild LA dilation.  Normal RV size.  Marland Kitchen UPPER GI ENDOSCOPY  09/23/2014   small hiatus hernia, nodules in stomach biopsied (chronic active erosive atrophic gastritis with intestinal metaplasia--no dysplasia or malignancy) otherwise normal   RLCP 05/19/2019: EF 35-40%. D1 50%, RI 70%, dLM-LAD 30%.  mPAP 43  mmHg - (62/19 mmHg), PCWP  28 mmHg (V wave 41 mmHg) LVEDP 34 mmHg   Immunization History  Administered Date(s) Administered  . Fluad Quad(high Dose 65+) 10/14/2018, 09/30/2019, 10/06/2020, 10/31/2021  . Influenza, High Dose Seasonal PF 11/11/2013, 11/06/2016  . Influenza,inj,Quad PF,6+ Mos 10/27/2014, 10/04/2015, 10/05/2017  . Influenza-Unspecified 10/21/2012  . Moderna Sars-Covid-2 Vaccination 02/04/2019, 03/17/2019, 11/18/2019, 04/21/2020  . PNEUMOCOCCAL CONJUGATE-20 09/15/2021  . Pneumococcal Conjugate-13 02/07/2016  . Pneumococcal-Unspecified 01/23/2014  . Tdap 11/16/1995, 12/09/2014  . Zoster Recombinat (Shingrix) 07/01/2021, 09/15/2021  . Zoster, Live 03/19/2012    MEDICATIONS/ALLERGIES   Current Meds    Medication Sig  . amiodarone (PACERONE) 200 MG tablet Take 0.5 tablets (100 mg total) by mouth daily.  Marland Kitchen apixaban (ELIQUIS) 5 MG TABS tablet TAKE 1 TABLET BY MOUTH TWICE A DAY  . cyanocobalamin (,VITAMIN B-12,) 1000 MCG/ML injection INJECT 1 ML INTO THE SKIN EVERY 30 DAYS.  Marland Kitchen diclofenac Sodium (VOLTAREN) 1 % GEL APPLY 2 G TOPICALLY 4 (FOUR) TIMES DAILY. APPLY TO LE JOINTS THAT ARE IN PAIN.  Marland Kitchen ENTRESTO 24-26 MG TAKE 1 TABLET BY MOUTH TWICE A DAY  . fish oil-omega-3 fatty acids 1000 MG capsule Take 1 g by mouth daily.  . fluticasone (CUTIVATE) 0.05 % cream Apply to affected area of lower legs bid prn  . furosemide (LASIX) 40 MG tablet Taking 1 tablet by mouth daily and 1/2 tablet in the afternoon  . HYDROcodone-acetaminophen (NORCO/VICODIN) 5-325 MG tablet 1-2 tabs po qid prn pain  . hydrOXYzine (VISTARIL) 25 MG capsule TAKE 1 TO 2 CAPSULES BY MOUTH AT BEDTIME FOR SLEEP  . levothyroxine (SYNTHROID) 112 MCG tablet TAKE 1 TABLET BY MOUTH EVERY DAY  . omeprazole (PRILOSEC) 20 MG capsule Take 1 capsule (20 mg total) by mouth daily.  . OXYGEN Inhale into the lungs. 1Lt at bedtime  as needed  . Potassium Chloride ER 20 MEQ TBCR Take 1 tablet by mouth daily.  . predniSONE  (DELTASONE) 20 MG tablet Take 3 tablets (60 mg total) by mouth daily with breakfast. Take x 4 days beginning the day after chemotherapy treatment  .  prochlorperazine (COMPAZINE) 5 MG tablet TAKE 1 TABLET BY MOUTH EVERY 6 HOURS AS NEEDED FOR NAUSEA OR VOMITING.  . rosuvastatin (CRESTOR) 20 MG tablet TAKE 1 TABLET BY MOUTH EVERY DAY  . Vitamin D, Ergocalciferol, (DRISDOL) 1.25 MG (50000 UNIT) CAPS capsule TAKE 1 CAPSULE (50,000 UNITS TOTAL) BY MOUTH TWO TIMES A WEEK    Allergies  Allergen Reactions  . Aspirin Itching  . Calcium Alginate Other (See Comments)    Excessive burning  . Morphine     Other reaction(s): Hallucinations  . Iodinated Contrast Media Rash and Other (See Comments)    CT Contrast    SOCIAL HISTORY/FAMILY HISTORY   Reviewed in Epic:  Pertinent findings:  Social History   Tobacco Use  . Smoking status: Never  . Smokeless tobacco: Never  Vaping Use  . Vaping Use: Never used  Substance Use Topics  . Alcohol use: No    Alcohol/week: 0.0 standard drinks of alcohol  . Drug use: No   Social History   Social History Narrative   Widowed, has 8 children.   Orig from [place], now lives in [place].   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   No tob/alc/drugs.   Tries to walk, but her back starts to hurt.  Has to use a walker.    OBJCTIVE -PE, EKG, labs   Wt Readings from Last 3 Encounters:  02/20/22 196 lb (88.9 kg)  02/08/22 196 lb 11.2 oz (89.2 kg)  02/01/22 199 lb 6.4 oz (90.4 kg)    Physical Exam: BP (!) 146/67   Pulse 63   Ht '5\' 6"'$  (1.676 m)   Wt 196 lb (88.9 kg)   SpO2 94%   BMI 31.64 kg/m  Physical Exam Constitutional:      General: She is not in acute distress.    Appearance: She is obese. She is ill-appearing (Chronically ill-appearing.  Weak, for well.  Mostly wheelchair-bound.). She is not toxic-appearing.  HENT:     Head: Atraumatic.     Comments: Borderline facial droop. Neck:     Vascular:  No carotid bruit (Soft radiated murmur) or JVD.  Cardiovascular:     Rate and Rhythm: Normal rate.  Musculoskeletal:     Cervical back: Normal range of motion and neck supple.  Neurological:     Mental Status: She is alert.     Ill-appearing.  Obese.  Now wheelchair-bound.  Appears to be somewhat weak and frail.  Obese. Mild facial droop 1/6 SEM at RUSB-neck. ?  S4. R>L LE edema    Adult ECG Report  Rate: 63;  Rhythm: normal sinus rhythm and rightward axis.  Nonspecific IVCD.  Cannot exclude anterior MI, age-indeterminate.  Stable T wave versions. ;   Narrative Interpretation: Stable  Recent Labs: Reviewed. Lab Results  Component Value Date   CHOL 176 02/23/2020   HDL 71.00 02/23/2020   LDLCALC 74 02/23/2020   LDLDIRECT 106.0 05/17/2018   TRIG 158.0 (H) 02/23/2020   CHOLHDL 2 02/23/2020   Lab Results  Component Value Date   CREATININE 1.15 (H) 02/08/2022   BUN 26 (H) 02/08/2022   NA 142 02/08/2022   K 3.9 02/08/2022   CL 102 02/08/2022   CO2 30 02/08/2022      Latest Ref Rng & Units 02/08/2022    8:23 AM 01/18/2022    8:29 AM 12/27/2021    8:23 AM  CBC  WBC 4.0 - 10.5 K/uL 3.3  2.7  2.8   Hemoglobin 12.0 - 15.0 g/dL 10.2  9.8  11.6   Hematocrit 36.0 - 46.0 % 32.7  31.5  35.8   Platelets 150 - 400 K/uL 98  130  122     Lab Results  Component Value Date   HGBA1C 5.5 02/23/2020   Lab Results  Component Value Date   TSH 9.78 (H) 02/01/2022    ================================================== I spent a total of 28 minutes with the patient spent in direct patient consultation.  Additional time spent with chart review  / charting (studies, outside notes, etc): 35 min Has been 2 years since have seen her.  [place] happened including the diagnosis of a follow-up.  Multiple clinic visits reviewed. Total Time: 63 min  Current medicines are reviewed at length with the patient today.  (+/- concerns) N/A  Notice: This dictation was prepared with Dragon dictation  along with smart phrase technology. Any transcriptional errors that result from this process are unintentional and may not be corrected upon review.  Studies Ordered:   Orders Placed This Encounter  Procedures  . C-reactive protein  . Sedimentation rate  . EKG 12-Lead  . ECHOCARDIOGRAM COMPLETE   No orders of the defined types were placed in this encounter.   Patient Instructions / Medication Changes & Studies & Tests Ordered   Patient Instructions  Medication Instructions:  No changes  *If you need a refill on your cardiac medications before your next appointment, please call your pharmacy*   Lab Work: Next time you have lab work at the cancer center please take the labslip with you  CRP Sed rate If you have labs (blood work) drawn today and your tests are completely normal, you will receive your results only by: MyChart Message (if you have MyChart) OR A paper copy in the mail If you have any lab test that is abnormal or we need to change your treatment, we will call you to review the results.   Testing/Procedures: Will be schedule at [facility] has requested that you have an echocardiogram. Echocardiography is a painless test that uses sound waves to create images of your heart. It provides your doctor with information about the size and shape of your heart and how well your heart's chambers and valves are working. This procedure takes approximately one hour. There are no restrictions for this procedure. Please do NOT wear cologne, perfume, aftershave, or lotions (deodorant is allowed). Please arrive 15 minutes prior to your appointment time.    Follow-Up: At [facility], you and your health needs are our priority.  As part of our continuing mission to provide you with exceptional heart care, we have created designated Provider Care Teams.  These Care Teams include your primary Cardiologist (physician) and Advanced Practice  Providers (APPs -  Physician Assistants and Nurse Practitioners) who all work together to provide you with the care you need, when you need it.     Your next appointment:   6 month(s)  The format for your next appointment:   In Person  Provider:   Diona Browner, NP    Then, Glenetta Hew, MD will plan to see you again in 12 month(s).   Other Instructions   Try elevated your feet daily 15 to 20 minutes a day   Try wear your  compression boot for 15  to 20 mutes a day      Leonie Man, MD, MS Glenetta Hew, M.D., M.S. Interventional Cardiologist  [facility]  Pager # [phone] Phone # [phone] [address] [place], [place] [address]   Thank you for choosing [facility]!!

## 2022-02-22 ENCOUNTER — Encounter: Payer: Self-pay | Admitting: Cardiology

## 2022-02-22 MED ORDER — APIXABAN 5 MG PO TABS: 5.0000 mg | ORAL_TABLET | Freq: Two times a day (BID) | ORAL | 0 refills | Status: DC

## 2022-02-22 NOTE — Assessment & Plan Note (Signed)
Mild nonobstructive CAD on evaluation.  No active anginal symptoms.  No beta-blocker because of amiodarone.  ARB covered with Entresto. On statin.  Have not seen lipids since 2022, but seems to be stable. No aspirin because of Eliquis.

## 2022-02-22 NOTE — Assessment & Plan Note (Signed)
At this point I think were probably passed the stage of needing annual PFTs.  I will prefer simply to check ESR and CRP along with LFTs and TFTs.  PCP and college are following LFTs and PCP is following TFTs.  We will have her have a CRP and ESR checked during one of her lab draws for chemotherapy. => Would probably best to be drawn prior to chemo and near the end of her cycle.  Thankfully, she does not afford. Also needs annual eye exams.

## 2022-02-22 NOTE — Assessment & Plan Note (Signed)
Edema is probably as much related to venous stasis as it is CHF.  She is on stable dose of diuretic.  Recommend using the compression pumps at least 15 minutes at a time 2-3 times a day if possible.  Also stressed the importance of foot elevation.  Try to avoid using excess diuretic.  Avoid salt, but would not recommend restricting fluids.

## 2022-02-22 NOTE — Assessment & Plan Note (Signed)
No bleeding issues.  Tolerating Eliquis well.  CHA2DS2-VASc or to least 4.  However, if there is any concerns of bleeding, or anemia with chemotherapy, threshold to hold Eliquis while she is on amiodarone.

## 2022-02-22 NOTE — Assessment & Plan Note (Signed)
Certainly exacerbated by A-fib.  As long as is not in A-fib she does not seem to have much the problems with heart failure.  She sleeps on a recliner but not really for orthopnea state.  Edema is probably as much related to venous stasis as it is related to CHF. Berry to go to classify her NYHA class because she is pretty much sedentary.  Since she sleeps on a chair anyway, it is difficult to tell if she has orthopnea or not either.  She seems to be euvolemic though with current dose of diuretic.  Plan:  Continue Entresto for afterload reduction add low-dose-although if pressures do increase, we could potentially titrate up once she has done with chemo.. Not on beta-blocker because of bradycardia on amiodarone. Would probably avoid spironolactone for fear of hyperkalemia and chemotherapy setting.  Once she is off chemo may consider. Continue current dosing of Lasix but reiterated the importance of using PRN dosing if necessary. Also recommend that she uses her compression boots.  Even if it is for 15 minutes at a time a couple times a day.

## 2022-02-22 NOTE — Assessment & Plan Note (Signed)
Blood pressure is little high today but for an 89 year old who is undergoing chemotherapy, I am very leery of being more aggressive than we already are controlling pressures.  For now we will hold off on any changes to allow for mild hypertension if necessary while she is undergoing chemotherapy.  She has had issues of orthostatic dizziness.  The very fact that she is sedentary and barely uses her legs would make her very very susceptible for orthostatic hypotension.

## 2022-02-22 NOTE — Assessment & Plan Note (Signed)
Maintaining sinus rhythm on amiodarone.  Stable on the 100 mg dose with no signs of bradycardia. No major bleeding on Eliquis.

## 2022-02-23 ENCOUNTER — Ambulatory Visit (HOSPITAL_BASED_OUTPATIENT_CLINIC_OR_DEPARTMENT_OTHER)
Admission: RE | Admit: 2022-02-23 | Discharge: 2022-02-23 | Disposition: A | Discharge status: Home / Self Care | Payer: PPO | Source: Ambulatory Visit | Attending: Nurse Practitioner | Admitting: Nurse Practitioner

## 2022-02-23 DIAGNOSIS — C8514 Unspecified B-cell lymphoma, lymph nodes of axilla and upper limb: Secondary | ICD-10-CM | POA: Diagnosis not present

## 2022-02-23 DIAGNOSIS — C8590 Non-Hodgkin lymphoma, unspecified, unspecified site: Secondary | ICD-10-CM | POA: Diagnosis not present

## 2022-02-23 DIAGNOSIS — I700 Atherosclerosis of aorta: Secondary | ICD-10-CM | POA: Diagnosis not present

## 2022-02-24 ENCOUNTER — Other Ambulatory Visit (HOSPITAL_COMMUNITY): Payer: Self-pay

## 2022-02-24 DIAGNOSIS — I480 Paroxysmal atrial fibrillation: Secondary | ICD-10-CM

## 2022-02-24 MED ORDER — APIXABAN 5 MG PO TABS: 5.0000 mg | ORAL_TABLET | Freq: Two times a day (BID) | ORAL | 0 refills | Status: DC

## 2022-02-25 ENCOUNTER — Other Ambulatory Visit: Payer: Self-pay | Admitting: Oncology

## 2022-02-25 DIAGNOSIS — I509 Heart failure, unspecified: Secondary | ICD-10-CM | POA: Diagnosis not present

## 2022-02-25 DIAGNOSIS — R609 Edema, unspecified: Secondary | ICD-10-CM | POA: Diagnosis not present

## 2022-02-26 ENCOUNTER — Other Ambulatory Visit: Payer: Self-pay | Admitting: Oncology

## 2022-03-01 ENCOUNTER — Inpatient Hospital Stay: Payer: PPO

## 2022-03-01 ENCOUNTER — Other Ambulatory Visit: Payer: Self-pay | Admitting: Nurse Practitioner

## 2022-03-01 ENCOUNTER — Inpatient Hospital Stay: Payer: PPO | Admitting: Oncology

## 2022-03-01 ENCOUNTER — Inpatient Hospital Stay: Payer: PPO | Admitting: Nutrition

## 2022-03-02 ENCOUNTER — Other Ambulatory Visit: Payer: Self-pay | Admitting: Family Medicine

## 2022-03-02 ENCOUNTER — Encounter (HOSPITAL_COMMUNITY): Payer: Self-pay | Admitting: *Deleted

## 2022-03-02 ENCOUNTER — Inpatient Hospital Stay: Payer: PPO

## 2022-03-02 DIAGNOSIS — C8514 Unspecified B-cell lymphoma, lymph nodes of axilla and upper limb: Secondary | ICD-10-CM

## 2022-03-02 MED ORDER — HYDROCODONE-ACETAMINOPHEN 5-325 MG PO TABS: ORAL_TABLET | ORAL | 0 refills | Status: DC

## 2022-03-02 NOTE — Telephone Encounter (Signed)
Requesting: Norco  Contract: 11/04/18 UDS: n/a Last Visit: 02/01/22 Next Visit: 05/03/22 Last Refill: 02/01/22( 180,0)  Please Advise.  Med pending

## 2022-03-08 ENCOUNTER — Inpatient Hospital Stay: Payer: PPO

## 2022-03-08 ENCOUNTER — Inpatient Hospital Stay: Payer: PPO | Admitting: Nutrition

## 2022-03-08 ENCOUNTER — Inpatient Hospital Stay: Payer: PPO | Admitting: Nurse Practitioner

## 2022-03-09 ENCOUNTER — Inpatient Hospital Stay: Payer: PPO

## 2022-03-14 ENCOUNTER — Inpatient Hospital Stay: Payer: PPO | Admitting: Nurse Practitioner

## 2022-03-14 ENCOUNTER — Inpatient Hospital Stay: Payer: PPO

## 2022-03-14 ENCOUNTER — Inpatient Hospital Stay: Payer: PPO | Admitting: Licensed Clinical Social Worker

## 2022-03-14 ENCOUNTER — Encounter: Payer: Self-pay | Admitting: Nurse Practitioner

## 2022-03-14 ENCOUNTER — Inpatient Hospital Stay: Payer: PPO | Attending: Oncology

## 2022-03-14 VITALS — BP 117/57 | HR 62 | Temp 98.1°F | Resp 18

## 2022-03-14 DIAGNOSIS — L03116 Cellulitis of left lower limb: Secondary | ICD-10-CM | POA: Diagnosis not present

## 2022-03-14 DIAGNOSIS — C8514 Unspecified B-cell lymphoma, lymph nodes of axilla and upper limb: Secondary | ICD-10-CM

## 2022-03-14 DIAGNOSIS — R200 Anesthesia of skin: Secondary | ICD-10-CM | POA: Insufficient documentation

## 2022-03-14 DIAGNOSIS — Z5189 Encounter for other specified aftercare: Secondary | ICD-10-CM | POA: Diagnosis not present

## 2022-03-14 DIAGNOSIS — C8200 Follicular lymphoma grade I, unspecified site: Secondary | ICD-10-CM | POA: Diagnosis not present

## 2022-03-14 DIAGNOSIS — Z5112 Encounter for antineoplastic immunotherapy: Secondary | ICD-10-CM | POA: Insufficient documentation

## 2022-03-14 DIAGNOSIS — Z5111 Encounter for antineoplastic chemotherapy: Secondary | ICD-10-CM | POA: Diagnosis not present

## 2022-03-14 LAB — CMP (CANCER CENTER ONLY)
ALT: 12 U/L (ref 0–44)
AST: 18 U/L (ref 15–41)
Albumin: 4.4 g/dL (ref 3.5–5.0)
Alkaline Phosphatase: 46 U/L (ref 38–126)
Anion gap: 8 (ref 5–15)
BUN: 19 mg/dL (ref 8–23)
CO2: 29 mmol/L (ref 22–32)
Calcium: 9.1 mg/dL (ref 8.9–10.3)
Chloride: 102 mmol/L (ref 98–111)
Creatinine: 1 mg/dL (ref 0.44–1.00)
GFR, Estimated: 55 mL/min — ABNORMAL LOW (ref >60)
Glucose, Bld: 121 mg/dL — ABNORMAL HIGH (ref 70–99)
Potassium: 3.8 mmol/L (ref 3.5–5.1)
Sodium: 139 mmol/L (ref 135–145)
Total Bilirubin: 0.6 mg/dL (ref 0.3–1.2)
Total Protein: 5.9 g/dL — ABNORMAL LOW (ref 6.5–8.1)

## 2022-03-14 LAB — CBC WITH DIFFERENTIAL (CANCER CENTER ONLY)
Abs Immature Granulocytes: 0.01 10*3/uL (ref 0.00–0.07)
Basophils Absolute: 0 10*3/uL (ref 0.0–0.1)
Basophils Relative: 1 %
Eosinophils Absolute: 0.1 10*3/uL (ref 0.0–0.5)
Eosinophils Relative: 3 %
HCT: 34.8 % — ABNORMAL LOW (ref 36.0–46.0)
Hemoglobin: 11.2 g/dL — ABNORMAL LOW (ref 12.0–15.0)
Immature Granulocytes: 0 %
Lymphocytes Relative: 26 %
Lymphs Abs: 0.7 10*3/uL (ref 0.7–4.0)
MCH: 30.3 pg (ref 26.0–34.0)
MCHC: 32.2 g/dL (ref 30.0–36.0)
MCV: 94.1 fL (ref 80.0–100.0)
Monocytes Absolute: 0.3 10*3/uL (ref 0.1–1.0)
Monocytes Relative: 12 %
Neutro Abs: 1.5 10*3/uL — ABNORMAL LOW (ref 1.7–7.7)
Neutrophils Relative %: 58 %
Platelet Count: 111 10*3/uL — ABNORMAL LOW (ref 150–400)
RBC: 3.7 MIL/uL — ABNORMAL LOW (ref 3.87–5.11)
RDW: 13.6 % (ref 11.5–15.5)
WBC Count: 2.7 10*3/uL — ABNORMAL LOW (ref 4.0–10.5)
nRBC: 0 % (ref 0.0–0.2)

## 2022-03-14 LAB — LACTATE DEHYDROGENASE: LDH: 199 U/L — ABNORMAL HIGH (ref 98–192)

## 2022-03-14 MED ORDER — PEGFILGRASTIM 6 MG/0.6ML ~~LOC~~ PSKT
6.0000 mg | PREFILLED_SYRINGE | Freq: Once | SUBCUTANEOUS | Status: AC
  Administered 2022-03-14: 6 mg via SUBCUTANEOUS
  Filled 2022-03-14: qty 0.6

## 2022-03-14 MED ORDER — SODIUM CHLORIDE 0.9 % IV SOLN
375.0000 mg/m2 | Freq: Once | INTRAVENOUS | Status: AC
  Administered 2022-03-14: 800 mg via INTRAVENOUS
  Filled 2022-03-14: qty 50

## 2022-03-14 MED ORDER — VINCRISTINE SULFATE CHEMO INJECTION 1 MG/ML
1.0000 mg | Freq: Once | INTRAVENOUS | Status: AC
  Administered 2022-03-14: 1 mg via INTRAVENOUS
  Filled 2022-03-14: qty 1

## 2022-03-14 MED ORDER — PALONOSETRON HCL INJECTION 0.25 MG/5ML
0.2500 mg | Freq: Once | INTRAVENOUS | Status: AC
  Administered 2022-03-14: 0.25 mg via INTRAVENOUS
  Filled 2022-03-14: qty 5

## 2022-03-14 MED ORDER — PROCHLORPERAZINE MALEATE 5 MG PO TABS: 5.0000 mg | ORAL_TABLET | Freq: Four times a day (QID) | ORAL | 1 refills | Status: DC | PRN

## 2022-03-14 MED ORDER — DIPHENHYDRAMINE HCL 25 MG PO CAPS
25.0000 mg | ORAL_CAPSULE | Freq: Once | ORAL | Status: AC
  Administered 2022-03-14: 25 mg via ORAL
  Filled 2022-03-14: qty 1

## 2022-03-14 MED ORDER — SODIUM CHLORIDE 0.9 % IV SOLN
50.0000 mg/m2 | Freq: Once | INTRAVENOUS | Status: AC
  Administered 2022-03-14: 100 mg via INTRAVENOUS
  Filled 2022-03-14: qty 5

## 2022-03-14 MED ORDER — ACETAMINOPHEN 325 MG PO TABS
650.0000 mg | ORAL_TABLET | Freq: Once | ORAL | Status: AC
  Administered 2022-03-14: 650 mg via ORAL
  Filled 2022-03-14: qty 2

## 2022-03-14 MED ORDER — SODIUM CHLORIDE 0.9 % IV SOLN: Freq: Once | INTRAVENOUS | Status: AC

## 2022-03-14 MED ORDER — SODIUM CHLORIDE 0.9 % IV SOLN
600.0000 mg/m2 | Freq: Once | INTRAVENOUS | Status: AC
  Administered 2022-03-14: 1240 mg via INTRAVENOUS
  Filled 2022-03-14: qty 50

## 2022-03-14 MED ORDER — SODIUM CHLORIDE 0.9 % IV SOLN
10.0000 mg | Freq: Once | INTRAVENOUS | Status: AC
  Administered 2022-03-14: 10 mg via INTRAVENOUS
  Filled 2022-03-14: qty 1

## 2022-03-14 NOTE — Progress Notes (Signed)
[place] OFFICE PROGRESS NOTE   Diagnosis: Non-Hodgkin's lymphoma  INTERVAL HISTORY:   Amanda Sellers returns as scheduled.  She completed cycle 4 CEOP/Rituxan 02/08/2022.  She canceled follow-up/treatment last week due to a death in her family.  She had a few episodes of mild nausea following last treatment.  She took Compazine with good relief.  No mouth sores.  No diarrhea or constipation.  She has mild intermittent numbness in the right hand, mainly at nighttime.  No cough or shortness of breath.  Objective:  Vital signs in last 24 hours:  Blood pressure 138/78, pulse 71, temperature 98.2 F (36.8 C), temperature source Oral, resp. rate 18, height 5' 6"$  (1.676 m), weight 198 lb (89.8 kg), SpO2 98 %.    HEENT: No thrush or ulcers. Lymphatics: No right axillary mass.  No palpable left axillary lymph nodes.  No cervical adenopathy. Resp: Lungs clear bilaterally. Cardio: Regular rate and rhythm. GI: Abdomen soft and nontender. Vascular: Trace edema lower leg bilaterally.  Chronic stasis change. Neuro: Alert and oriented. Breast: No right breast edema or erythema.   Lab Results:  Lab Results  Component Value Date   WBC 2.7 (L) 03/14/2022   HGB 11.2 (L) 03/14/2022   HCT 34.8 (L) 03/14/2022   MCV 94.1 03/14/2022   PLT 111 (L) 03/14/2022   NEUTROABS 1.5 (L) 03/14/2022    Imaging:  No results found.  Medications: I have reviewed the patient's current medications.  Assessment/Plan: Non-Hodgkin's Lymphoma-bone marrow biopsy 07/24/2014 consistent with involvement by follicular B-cell lymphoma, CD20 positive   CTs of the chest 07/22/2014 and CT of the abdomen and pelvis on 07/23/2014-pulmonary nodules, hilar/mediastinal/supraclavicular/axillary adenopathy, splenomegaly, abdominal adenopathy, bilateral adrenal nodules   Cycle 1 bendamustine/rituximab 08/04/2014 Cycle 2 bendamustine/Rituxan 09/01/2014 Cycle 3 bendamustine/Rituxan 09/30/2014 Cycle 4  bendamustine/rituximab 10/27/2014 Restaging CT scans 11/17/2014 with significant improvement in diffuse lymphadenopathy, lung nodules, and renal lesions 2. Severe microcytic anemia, status post transfusion with packed red blood cells 07/23/2014   Normal ferritin, low transferrin saturation, "scant" bone marrow iron stores, review of peripheral blood smear consistent with iron deficiency anemia   colonoscopy and upper endoscopy 09/23/2014 3. Exertional dyspnea /orthopnea-most likely secondary to congestive heart failure   4. History of B-12 deficiency   5. Rash over the trunk and extremities 08/18/2014. Appears consistent with a drug rash. Resolved 08/28/2014. Rash 11/18/2014, likely secondary to a contrast allergy  , diagnostic contrast will be listed as an allergy 6. Colonoscopy 06/03/2008. Medium sized internal hemorrhoids.   7. Admission with atrial flutter with a rapid ventricular response 10/15/2014-status post cardioversion 10/16/2014, maintained on     apixaban Repeat cardioversion 11/30/2014   8. Right olecranon skin lesion-likely a lipoma or cyst 9. 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  Large right axillary mass 11/24/2021 CT with numerous bulky matted appearing right axillary lymph nodes, largest discretely measurable node measuring 4.6 x 4.3 cm.  Severe edema of the right breast and right chest wall.  No lymphadenopathy or metastatic disease in the abdomen or pelvis.   Bilateral mammogram and ultrasound right breast 11/29/2021-suspicious mass possibly a replaced lymph node right axilla measuring 4.3 cm.  At least 1 additional abnormal lymph node with cortical thickening identified in the right axilla.  Diffuse right breast skin thickening and increased trabeculation.   Biopsy right axillary node 11/29/2021-high-grade B-cell lymphoma with proliferation rate approaching 100%,  29% of the cells kappa restricted CD10 positive B cells Cycle 1 CEOP-Rituxan  12/06/2021 Cycle 2  CEOP-Rituxan 12/27/2021, Udenyca Cycle 3 CEOP-Rituxan 01/18/2022, Udenyca Cycle 4 CEOP-Rituxan 02/08/2022, Udenyca Restaging chest CT 02/23/2022-right axilla is incompletely imaged but there appears to have been substantial decrease in size of bulky right axillary and subpectoral lymphadenopathy.  Matted soft tissue remains in this vicinity without discretely measurable lymph nodes.  No other enlarged mediastinal, hilar or axillary lymph nodes.  Interval resolution of previously seen soft tissue edema involving the right breast and chest wall. Cycle 5 CEOP-Rituxan 03/14/2022   11.  Left lower extremity cellulitis 12/21/2021.  Improved since beginning doxycycline 12/19/2021.  14-day course planned.  Disposition: Amanda Sellers appears stable.  She has completed 4 cycles of CEOP-Rituxan.  She is tolerating treatment well.  Restaging chest CT after cycle 4 shows significant improvement.  Overall plan is to complete 2 additional cycles, cycle 5 today as scheduled.  CBC and chemistry panel reviewed.  Labs adequate to proceed as above.  Mild neutropenia with ANC 1.5.  She receives white cell growth factor support.  She and her daughter understand to contact the office with fever, chills, other signs of infection.  She will return for lab, follow-up, cycle 6 CEOP-Rituxan in 3 weeks.  We are available to see her sooner if needed.  Patient seen with Dr. Benay Spice.  CT report/images reviewed with Amanda Sellers and her daughter on the computer.    Ned Card ANP/GNP-BC   03/14/2022  8:37 AM   This was a shared visit with Ned Card.  Amanda Sellers was interviewed and examined.  She has completed 4 cycles of systemic therapy for treatment of recurrent lymphoma.  There has been marked clinical and radiologic improvement.  The plan is to proceed with 2 more cycles of CEOP-rituximab We reviewed the CT findings and images with Amanda Sellers and her daughter.  I was present for greater than 50% of  today's visit.  I performed medical decision making.  Julieanne Manson, MD

## 2022-03-14 NOTE — Patient Instructions (Addendum)
Your Neulasta Onpro will be ready to be removed after 6pm on 03/15/22.   Amanda Sellers   Discharge Instructions: Thank you for choosing [facility] to provide your oncology and hematology care.   If you have a lab appointment with the [facility], please go directly to the [facility] and check in at the registration area.   Wear comfortable clothing and clothing appropriate for easy access to any Portacath or PICC line.   We strive to give you quality time with your provider. You may need to reschedule your appointment if you arrive late (15 or more minutes).  Arriving late affects you and other patients whose appointments are after yours.  Also, if you miss three or more appointments without notifying the office, you may be dismissed from the clinic at the provider's discretion.      For prescription refill requests, have your pharmacy contact our office and allow 72 hours for refills to be completed.    Today you received the following chemotherapy and/or immunotherapy agents Vincristine, Cytoxan, Etoposide, Rituximab-abbs      To help prevent nausea and vomiting after your treatment, we encourage you to take your nausea medication as directed.  BELOW ARE SYMPTOMS THAT SHOULD BE REPORTED IMMEDIATELY: *FEVER GREATER THAN 100.4 F (38 C) OR HIGHER *CHILLS OR SWEATING *NAUSEA AND VOMITING THAT IS NOT CONTROLLED WITH YOUR NAUSEA MEDICATION *UNUSUAL SHORTNESS OF BREATH *UNUSUAL BRUISING OR BLEEDING *URINARY PROBLEMS (pain or burning when urinating, or frequent urination) *BOWEL PROBLEMS (unusual diarrhea, constipation, pain near the anus) TENDERNESS IN MOUTH AND THROAT WITH OR WITHOUT PRESENCE OF ULCERS (sore throat, sores in mouth, or a toothache) UNUSUAL RASH, SWELLING OR PAIN  UNUSUAL VAGINAL DISCHARGE OR ITCHING   Items with * indicate a potential emergency and should be followed up as soon as possible or go to the Emergency  Department if any problems should occur.  Please show the CHEMOTHERAPY ALERT CARD or IMMUNOTHERAPY ALERT CARD at check-in to the Emergency Department and triage nurse.  Should you have questions after your visit or need to cancel or reschedule your appointment, please contact [facility]  Dept: [phone]  and follow the prompts.  Office hours are 8:00 a.m. to 4:30 p.m. Monday - Friday. Please note that voicemails left after 4:00 p.m. may not be returned until the following business day.  We are closed weekends and major holidays. You have access to a nurse at all times for urgent questions. Please call the main number to the clinic Dept: [phone] and follow the prompts.   For any non-urgent questions, you may also contact your provider using MyChart. We now offer e-Visits for anyone 36 and older to request care online for non-urgent symptoms. For details visit [email or web address].   Also download the MyChart app! Go to the app store, search "MyChart", open the app, select [facility], and log in with your MyChart username and password.  Vincristine Injection What is this medication? VINCRISTINE (vin KRIS teen) treats some types of cancer. It works by slowing down the growth of cancer cells. This medicine may be used for other purposes; ask your health care provider or pharmacist if you have questions. COMMON BRAND NAME(S): Oncovin, Vincasar PFS What should I tell my care team before I take this medication? They need to know if you have any of these conditions: Infection Kidney disease Liver disease Low white blood cell levels Lung disease Nervous system disease, such  as Charcot-Marie-Tooth (CMT) Recent or ongoing radiation therapy An unusual or allergic reaction to vincristine, other chemotherapy agents, other medications, foods, dyes, or preservatives Pregnant or trying to get pregnant Breast-feeding How should I use this medication? This  medication is infused into a vein. It is given by your care team in a hospital or clinic setting. Talk to your care team about the use of this medication in children. While it may be given to children for selected conditions, precautions do apply. Overdosage: If you think you have taken too much of this medicine contact a poison control center or emergency room at once. NOTE: This medicine is only for you. Do not share this medicine with others. What if I miss a dose? Keep appointments for follow-up doses. It is important not to miss your dose. Call your care team if you are unable to keep an appointment. What may interact with this medication? Do not take this medication with any of the following: Live virus vaccines This medication may also interact with the following: Medications for fungal infections, such as itraconazole or fluconazole Phenytoin Supplements, such as St. John's wort This list may not describe all possible interactions. Give your health care provider a list of all the medicines, herbs, non-prescription drugs, or dietary supplements you use. Also tell them if you smoke, drink alcohol, or use illegal drugs. Some items may interact with your medicine. What should I watch for while using this medication? Your condition will be monitored carefully while you are receiving this medication. This medication may make you feel generally unwell. This is not uncommon as chemotherapy can affect healthy cells as well as cancer cells. Report any side effects. Continue your course of treatment even though you feel ill unless your care team tells you to stop. You may need blood work while taking this medication. This medication may increase your risk to bruise or bleed. Call your care team if you notice any unusual bleeding. This medication may increase your risk of getting an infection. Call your care team for advice if you get a fever, chills, sore throat, or other symptoms of a cold or flu.  Do not treat yourself. Try to avoid being around people who are sick. This medication will cause constipation. If you do not have a bowel movement for 3 days, call your care team. Call your care team if you are around anyone with measles, chickenpox, or if you develop sores or blisters that do not heal properly. Be careful brushing or flossing your teeth or using a toothpick because you may get an infection or bleed more easily. If you have any dental work done, tell your dentist you are receiving this medication Talk to your care team if you or your partner wish to become pregnant or think either of you might be pregnant. This medication can cause serious birth defects. This medication may cause infertility. Talk to your care team if you are concerned about your fertility. Talk to your care team before breastfeeding. Changes to your treatment plan may be needed. What side effects may I notice from receiving this medication? Side effects that you should report to your care team as soon as possible: Allergic reactions--skin rash, itching, hives, swelling of the face, lips, tongue, or throat High uric acid level--severe pain, redness, warmth, or swelling in joints, pain or trouble passing urine, pain in the lower back or sides Infection--fever, chills, cough, sore throat, wounds that don't heal, pain or trouble when passing urine, general feeling of  discomfort or being unwell Pain, tingling, or numbness in the hands or feet, muscle weakness, change in vision, confusion or trouble speaking, loss of balance or coordination, trouble walking, seizures Painful swelling, warmth, or redness of the skin, blisters or sores at the infusion site Shortness of breath or trouble breathing Side effects that usually do not require medical attention (report to your care team if they continue or are bothersome): Constipation Diarrhea Hair loss Loss of appetite Nausea Stomach cramping Vomiting This list may not  describe all possible side effects. Call your doctor for medical advice about side effects. You may report side effects to FDA at 1-800-FDA-1088. Where should I keep my medication? This medication is given in a hospital or clinic. It will not be stored at home. NOTE: This sheet is a summary. It may not cover all possible information. If you have questions about this medicine, talk to your doctor, pharmacist, or health care provider.  2023 Elsevier/Gold Standard (2021-04-05 00:00:00) Cyclophosphamide Injection What is this medication? CYCLOPHOSPHAMIDE (sye kloe FOSS fa mide) treats some types of cancer. It works by slowing down the growth of cancer cells. This medicine may be used for other purposes; ask your health care provider or pharmacist if you have questions. COMMON BRAND NAME(S): Cyclophosphamide, Cytoxan, Neosar What should I tell my care team before I take this medication? They need to know if you have any of these conditions: Heart disease Irregular heartbeat or rhythm Infection Kidney problems Liver disease Low blood cell levels (white cells, platelets, or red blood cells) Lung disease Previous radiation Trouble passing urine An unusual or allergic reaction to cyclophosphamide, other medications, foods, dyes, or preservatives Pregnant or trying to get pregnant Breast-feeding How should I use this medication? This medication is injected into a vein. It is given by your care team in a hospital or clinic setting. Talk to your care team about the use of this medication in children. Special care may be needed. Overdosage: If you think you have taken too much of this medicine contact a poison control center or emergency room at once. NOTE: This medicine is only for you. Do not share this medicine with others. What if I miss a dose? Keep appointments for follow-up doses. It is important not to miss your dose. Call your care team if you are unable to keep an appointment. What may  interact with this medication? Amphotericin B Amiodarone Azathioprine Certain antivirals for HIV or hepatitis Certain medications for blood pressure, such as enalapril, lisinopril, quinapril Cyclosporine Diuretics Etanercept Indomethacin Medications that relax muscles Metronidazole Natalizumab Tamoxifen Warfarin This list may not describe all possible interactions. Give your health care provider a list of all the medicines, herbs, non-prescription drugs, or dietary supplements you use. Also tell them if you smoke, drink alcohol, or use illegal drugs. Some items may interact with your medicine. What should I watch for while using this medication? This medication may make you feel generally unwell. This is not uncommon as chemotherapy can affect healthy cells as well as cancer cells. Report any side effects. Continue your course of treatment even though you feel ill unless your care team tells you to stop. You may need blood work while you are taking this medication. This medication may increase your risk of getting an infection. Call your care team for advice if you get a fever, chills, sore throat, or other symptoms of a cold or flu. Do not treat yourself. Try to avoid being around people who are sick. Avoid taking medications  that contain aspirin, acetaminophen, ibuprofen, naproxen, or ketoprofen unless instructed by your care team. These medications may hide a fever. Be careful brushing or flossing your teeth or using a toothpick because you may get an infection or bleed more easily. If you have any dental work done, tell your dentist you are receiving this medication. Drink water or other fluids as directed. Urinate often, even at night. Some products may contain alcohol. Ask your care team if this medication contains alcohol. Be sure to tell all care teams you are taking this medicine. Certain medicines, like metronidazole and disulfiram, can cause an unpleasant reaction when taken with  alcohol. The reaction includes flushing, headache, nausea, vomiting, sweating, and increased thirst. The reaction can last from 30 minutes to several hours. Talk to your care team if you wish to become pregnant or think you might be pregnant. This medication can cause serious birth defects if taken during pregnancy and for 1 year after the last dose. A negative pregnancy test is required before starting this medication. A reliable form of contraception is recommended while taking this medication and for 1 year after the last dose. Talk to your care team about reliable forms of contraception. Do not father a child while taking this medication and for 4 months after the last dose. Use a condom during this time period. Do not breast-feed while taking this medication or for 1 week after the last dose. This medication may cause infertility. Talk to your care team if you are concerned about your fertility. Talk to your care team about your risk of cancer. You may be more at risk for certain types of cancer if you take this medication. What side effects may I notice from receiving this medication? Side effects that you should report to your care team as soon as possible: Allergic reactions--skin rash, itching, hives, swelling of the face, lips, tongue, or throat Dry cough, shortness of breath or trouble breathing Heart failure--shortness of breath, swelling of the ankles, feet, or hands, sudden weight gain, unusual weakness or fatigue Heart muscle inflammation--unusual weakness or fatigue, shortness of breath, chest pain, fast or irregular heartbeat, dizziness, swelling of the ankles, feet, or hands Heart rhythm changes--fast or irregular heartbeat, dizziness, feeling faint or lightheaded, chest pain, trouble breathing Infection--fever, chills, cough, sore throat, wounds that don't heal, pain or trouble when passing urine, general feeling of discomfort or being unwell Kidney injury--decrease in the amount of  urine, swelling of the ankles, hands, or feet Liver injury--right upper belly pain, loss of appetite, nausea, light-colored stool, dark yellow or brown urine, yellowing skin or eyes, unusual weakness or fatigue Low red blood cell level--unusual weakness or fatigue, dizziness, headache, trouble breathing Low sodium level--muscle weakness, fatigue, dizziness, headache, confusion Red or dark brown urine Unusual bruising or bleeding Side effects that usually do not require medical attention (report to your care team if they continue or are bothersome): Hair loss Irregular menstrual cycles or spotting Loss of appetite Nausea Pain, redness, or swelling with sores inside the mouth or throat Vomiting This list may not describe all possible side effects. Call your doctor for medical advice about side effects. You may report side effects to FDA at 1-800-FDA-1088. Where should I keep my medication? This medication is given in a hospital or clinic. It will not be stored at home. NOTE: This sheet is a summary. It may not cover all possible information. If you have questions about this medicine, talk to your doctor, pharmacist, or health care provider.  2023 Elsevier/Gold Standard (2021-03-01 00:00:00) Etoposide Injection What is this medication? ETOPOSIDE (e toe POE side) treats some types of cancer. It works by slowing down the growth of cancer cells. This medicine may be used for other purposes; ask your health care provider or pharmacist if you have questions. COMMON BRAND NAME(S): Etopophos, Toposar, VePesid What should I tell my care team before I take this medication? They need to know if you have any of these conditions: Infection Kidney disease Liver disease Low blood counts, such as low white cell, platelet, red cell counts An unusual or allergic reaction to etoposide, other medications, foods, dyes, or preservatives If you or your partner are pregnant or trying to get  pregnant Breastfeeding How should I use this medication? This medication is injected into a vein. It is given by your care team in a hospital or clinic setting. Talk to your care team about the use of this medication in children. Special care may be needed. Overdosage: If you think you have taken too much of this medicine contact a poison control center or emergency room at once. NOTE: This medicine is only for you. Do not share this medicine with others. What if I miss a dose? Keep appointments for follow-up doses. It is important not to miss your dose. Call your care team if you are unable to keep an appointment. What may interact with this medication? Warfarin This list may not describe all possible interactions. Give your health care provider a list of all the medicines, herbs, non-prescription drugs, or dietary supplements you use. Also tell them if you smoke, drink alcohol, or use illegal drugs. Some items may interact with your medicine. What should I watch for while using this medication? Your condition will be monitored carefully while you are receiving this medication. This medication may make you feel generally unwell. This is not uncommon as chemotherapy can affect healthy cells as well as cancer cells. Report any side effects. Continue your course of treatment even though you feel ill unless your care team tells you to stop. This medication can cause serious side effects. To reduce the risk, your care team may give you other medications to take before receiving this one. Be sure to follow the directions from your care team. This medication may increase your risk of getting an infection. Call your care team for advice if you get a fever, chills, sore throat, or other symptoms of a cold or flu. Do not treat yourself. Try to avoid being around people who are sick. This medication may increase your risk to bruise or bleed. Call your care team if you notice any unusual bleeding. Talk to  your care team about your risk of cancer. You may be more at risk for certain types of cancers if you take this medication. Talk to your care team if you may be pregnant. Serious birth defects can occur if you take this medication during pregnancy and for 6 months after the last dose. You will need a negative pregnancy test before starting this medication. Contraception is recommended while taking this medication and for 6 months after the last dose. Your care team can help you find the option that works for you. If your partner can get pregnant, use a condom during sex while taking this medication and for 4 months after the last dose. Do not breastfeed while taking this medication. This medication may cause infertility. Talk to your care team if you are concerned about your fertility. What side effects may  I notice from receiving this medication? Side effects that you should report to your care team as soon as possible: Allergic reactions--skin rash, itching, hives, swelling of the face, lips, tongue, or throat Infection--fever, chills, cough, sore throat, wounds that don't heal, pain or trouble when passing urine, general feeling of discomfort or being unwell Low red blood cell level--unusual weakness or fatigue, dizziness, headache, trouble breathing Unusual bruising or bleeding Side effects that usually do not require medical attention (report to your care team if they continue or are bothersome): Diarrhea Fatigue Hair loss Loss of appetite Nausea Vomiting This list may not describe all possible side effects. Call your doctor for medical advice about side effects. You may report side effects to FDA at 1-800-FDA-1088. Where should I keep my medication? This medication is given in a hospital or clinic. It will not be stored at home. NOTE: This sheet is a summary. It may not cover all possible information. If you have questions about this medicine, talk to your doctor, pharmacist, or health  care provider.  2023 Elsevier/Gold Standard (2007-03-02 00:00:00) Rituximab Injection What is this medication? RITUXIMAB (ri TUX i mab) treats leukemia and lymphoma. It works by blocking a protein that causes cancer cells to grow and multiply. This helps to slow or stop the spread of cancer cells. It may also be used to treat autoimmune conditions, such as arthritis. It works by slowing down an overactive immune system. It is a monoclonal antibody. This medicine may be used for other purposes; ask your health care provider or pharmacist if you have questions. COMMON BRAND NAME(S): RIABNI, Rituxan, RUXIENCE, truxima What should I tell my care team before I take this medication? They need to know if you have any of these conditions: Chest pain Heart disease Immune system problems Infection, such as chickenpox, cold sores, hepatitis B, herpes Irregular heartbeat or rhythm Kidney disease Low blood counts, such as low white cells, platelets, red cells Lung disease Recent or upcoming vaccine An unusual or allergic reaction to rituximab, other medications, foods, dyes, or preservatives Pregnant or trying to get pregnant Breast-feeding How should I use this medication? This medication is injected into a vein. It is given by a care team in a hospital or clinic setting. A special MedGuide will be given to you before each treatment. Be sure to read this information carefully each time. Talk to your care team about the use of this medication in children. While this medication may be prescribed for children as young as 6 months for selected conditions, precautions do apply. Overdosage: If you think you have taken too much of this medicine contact a poison control center or emergency room at once. NOTE: This medicine is only for you. Do not share this medicine with others. What if I miss a dose? Keep appointments for follow-up doses. It is important not to miss your dose. Call your care team if you  are unable to keep an appointment. What may interact with this medication? Do not take this medication with any of the following: Live vaccines This medication may also interact with the following: Cisplatin This list may not describe all possible interactions. Give your health care provider a list of all the medicines, herbs, non-prescription drugs, or dietary supplements you use. Also tell them if you smoke, drink alcohol, or use illegal drugs. Some items may interact with your medicine. What should I watch for while using this medication? Your condition will be monitored carefully while you are receiving this medication. You  may need blood work while taking this medication. This medication can cause serious infusion reactions. To reduce the risk your care team may give you other medications to take before receiving this one. Be sure to follow the directions from your care team. This medication may increase your risk of getting an infection. Call your care team for advice if you get a fever, chills, sore throat, or other symptoms of a cold or flu. Do not treat yourself. Try to avoid being around people who are sick. Call your care team if you are around anyone with measles, chickenpox, or if you develop sores or blisters that do not heal properly. Avoid taking medications that contain aspirin, acetaminophen, ibuprofen, naproxen, or ketoprofen unless instructed by your care team. These medications may hide a fever. This medication may cause serious skin reactions. They can happen weeks to months after starting the medication. Contact your care team right away if you notice fevers or flu-like symptoms with a rash. The rash may be red or purple and then turn into blisters or peeling of the skin. You may also notice a red rash with swelling of the face, lips, or lymph nodes in your neck or under your arms. In some patients, this medication may cause a serious brain infection that may cause death. If you  have any problems seeing, thinking, speaking, walking, or standing, tell your care team right away. If you cannot reach your care team, urgently seek another source of medical care. Talk to your care team if you may be pregnant. Serious birth defects can occur if you take this medication during pregnancy and for 12 months after the last dose. You will need a negative pregnancy test before starting this medication. Contraception is recommended while taking this medication and for 12 months after the last dose. Your care team can help you find the option that works for you. Do not breastfeed while taking this medication and for at least 6 months after the last dose. What side effects may I notice from receiving this medication? Side effects that you should report to your care team as soon as possible: Allergic reactions or angioedema--skin rash, itching or hives, swelling of the face, eyes, lips, tongue, arms, or legs, trouble swallowing or breathing Bowel blockage--stomach cramping, unable to have a bowel movement or pass gas, loss of appetite, vomiting Dizziness, loss of balance or coordination, confusion or trouble speaking Heart attack--pain or tightness in the chest, shoulders, arms, or jaw, nausea, shortness of breath, cold or clammy skin, feeling faint or lightheaded Heart rhythm changes--fast or irregular heartbeat, dizziness, feeling faint or lightheaded, chest pain, trouble breathing Infection--fever, chills, cough, sore throat, wounds that don't heal, pain or trouble when passing urine, general feeling of discomfort or being unwell Infusion reactions--chest pain, shortness of breath or trouble breathing, feeling faint or lightheaded Kidney injury--decrease in the amount of urine, swelling of the ankles, hands, or feet Liver injury--right upper belly pain, loss of appetite, nausea, light-colored stool, dark yellow or brown urine, yellowing skin or eyes, unusual weakness or fatigue Redness,  blistering, peeling, or loosening of the skin, including inside the mouth Stomach pain that is severe, does not go away, or gets worse Tumor lysis syndrome (TLS)--nausea, vomiting, diarrhea, decrease in the amount of urine, dark urine, unusual weakness or fatigue, confusion, muscle pain or cramps, fast or irregular heartbeat, joint pain Side effects that usually do not require medical attention (report to your care team if they continue or are bothersome): Headache Joint  pain Nausea Runny or stuffy nose Unusual weakness or fatigue This list may not describe all possible side effects. Call your doctor for medical advice about side effects. You may report side effects to FDA at 1-800-FDA-1088. Where should I keep my medication? This medication is given in a hospital or clinic. It will not be stored at home. NOTE: This sheet is a summary. It may not cover all possible information. If you have questions about this medicine, talk to your doctor, pharmacist, or health care provider.  2023 Elsevier/Gold Standard (2021-05-24 00:00:00)

## 2022-03-14 NOTE — Progress Notes (Signed)
Amanda Sellers  Clinical Social Sellers was referred by medical provider for assessment of psychosocial needs.  Clinical Social Worker met with patient and her daughter, Amanda Sellers, to offer support and assess for needs.    They stated they had questions about her advance directives.  Daughter stated when patient was previously in a nursing facility, she was a DNR, and the family was not aware of this.  Amanda Sellers had a copy of patient's Living Will and HCPOA which CSW reviewed with patient who stated she understood.  Amanda Sellers also had her sister, [name], on a conference call when CSW introduced the MOST form.  Amanda Sellers and [name] agreed this would be very beneficial to patient and would discuss with her PCP or Dr. Benay Spice on her next visit.  Patient has six daughters and one son.  Her other son died unexpectedly last week.  This event prompted the family to reevaluate patient's advance directives.  Provided CSW contact information and supportive counseling.   Margaree Mackintosh, LCSW  Clinical Social Worker [facility]

## 2022-03-14 NOTE — Addendum Note (Signed)
Addended by: [name] B on: 03/14/2022 03:51 PM   Modules accepted: Level of Service

## 2022-03-17 ENCOUNTER — Ambulatory Visit (HOSPITAL_COMMUNITY): Payer: PPO | Attending: Cardiology

## 2022-03-17 DIAGNOSIS — I447 Left bundle-branch block, unspecified: Secondary | ICD-10-CM | POA: Diagnosis not present

## 2022-03-17 DIAGNOSIS — I480 Paroxysmal atrial fibrillation: Secondary | ICD-10-CM | POA: Diagnosis not present

## 2022-03-17 LAB — ECHOCARDIOGRAM COMPLETE
Area-P 1/2: 4.26 cm2
MV M vel: 5.02 m/s
MV Peak grad: 100.8 mmHg
P 1/2 time: 292 msec
Radius: 0.6 cm
S' Lateral: 3.7 cm

## 2022-03-19 ENCOUNTER — Encounter: Payer: Self-pay | Admitting: Family Medicine

## 2022-03-22 ENCOUNTER — Other Ambulatory Visit: Payer: PPO

## 2022-03-22 ENCOUNTER — Inpatient Hospital Stay: Payer: PPO

## 2022-03-22 ENCOUNTER — Ambulatory Visit: Payer: PPO | Admitting: Oncology

## 2022-03-22 ENCOUNTER — Inpatient Hospital Stay: Payer: PPO | Admitting: Nurse Practitioner

## 2022-03-22 ENCOUNTER — Ambulatory Visit: Payer: PPO

## 2022-03-23 ENCOUNTER — Inpatient Hospital Stay: Payer: PPO

## 2022-03-24 ENCOUNTER — Telehealth: Payer: Self-pay | Admitting: *Deleted

## 2022-03-24 NOTE — Telephone Encounter (Signed)
Unable to leave a message - need to schedule an appointment for patient with Raquel Sarna  for late March or early April , f/u appt with Dr harding 3 to 4 month after that per  echo report    Ejection fraction in the low normal range of 50 to 55%. (Normal range 50 to 70%).  No regional wall motion abnormality suggest prior heart attack.  Unable to assess diastolic function, however the left atrium appears to be severely dilated. There is moderate to severe mitral regurgitation with moderate mitral valve calcification. Mild aortic valve regurgitation. Right ventricle.  Mildly enlarged but normal pulmonary pressures and only mildly elevated right atrial pressures.   When compared to September 2021: Mitral valve regurgitation now read as moderate to severe regurgitation.  Right atrial pressures are now elevated. -- plan had been f/u in 6 months with APP - Diona Browner, NP then me in 6 months. -- would like to move up f/u visit to March/April with Raquel Sarna & then 3-4 ,months later with me.  Need to get a good feel of her current Sx -- if she is more symptomatic (~CHF Sx) - would want to think about potential referral to Structural Team to consider TEER Mitral Clip.   Glenetta Hew

## 2022-03-26 DIAGNOSIS — R609 Edema, unspecified: Secondary | ICD-10-CM | POA: Diagnosis not present

## 2022-03-26 DIAGNOSIS — I509 Heart failure, unspecified: Secondary | ICD-10-CM | POA: Diagnosis not present

## 2022-03-27 ENCOUNTER — Other Ambulatory Visit: Payer: Self-pay | Admitting: Cardiology

## 2022-03-29 ENCOUNTER — Ambulatory Visit: Payer: PPO | Admitting: Nurse Practitioner

## 2022-03-29 ENCOUNTER — Other Ambulatory Visit: Payer: PPO

## 2022-03-29 ENCOUNTER — Ambulatory Visit: Payer: PPO

## 2022-03-30 ENCOUNTER — Ambulatory Visit: Payer: PPO

## 2022-03-30 NOTE — Telephone Encounter (Signed)
Called 2nd attempt- unable to leave a message due to mailbox full, option to send SMS message  Rn did send message awaiting for patient daughter - [name] to to respond

## 2022-03-31 NOTE — Telephone Encounter (Signed)
Called spoke to [place] - patient's daughter -  Appointment schedule  for follow up  in April  with  Raquel Sarna  and July with Dr Ellyn Hack    Daughter verbalized understanding.

## 2022-04-02 ENCOUNTER — Other Ambulatory Visit: Payer: Self-pay | Admitting: Oncology

## 2022-04-04 ENCOUNTER — Other Ambulatory Visit: Payer: Self-pay | Admitting: Family Medicine

## 2022-04-04 ENCOUNTER — Encounter: Payer: Self-pay | Admitting: Nurse Practitioner

## 2022-04-04 ENCOUNTER — Other Ambulatory Visit (HOSPITAL_BASED_OUTPATIENT_CLINIC_OR_DEPARTMENT_OTHER): Payer: Self-pay

## 2022-04-04 ENCOUNTER — Inpatient Hospital Stay: Payer: PPO | Admitting: Licensed Clinical Social Worker

## 2022-04-04 ENCOUNTER — Inpatient Hospital Stay: Payer: PPO | Attending: Nurse Practitioner

## 2022-04-04 ENCOUNTER — Inpatient Hospital Stay: Payer: PPO

## 2022-04-04 ENCOUNTER — Inpatient Hospital Stay: Payer: PPO | Admitting: Nurse Practitioner

## 2022-04-04 ENCOUNTER — Encounter: Payer: Self-pay | Admitting: Oncology

## 2022-04-04 VITALS — BP 135/54 | HR 72 | Temp 98.2°F | Resp 18

## 2022-04-04 VITALS — BP 119/50 | HR 60 | Temp 98.2°F | Resp 18 | Ht 66.0 in | Wt 200.0 lb

## 2022-04-04 DIAGNOSIS — Z5111 Encounter for antineoplastic chemotherapy: Secondary | ICD-10-CM | POA: Diagnosis not present

## 2022-04-04 DIAGNOSIS — C8514 Unspecified B-cell lymphoma, lymph nodes of axilla and upper limb: Secondary | ICD-10-CM

## 2022-04-04 DIAGNOSIS — C8298 Follicular lymphoma, unspecified, lymph nodes of multiple sites: Secondary | ICD-10-CM | POA: Diagnosis not present

## 2022-04-04 DIAGNOSIS — Z5112 Encounter for antineoplastic immunotherapy: Secondary | ICD-10-CM | POA: Diagnosis not present

## 2022-04-04 DIAGNOSIS — Z5189 Encounter for other specified aftercare: Secondary | ICD-10-CM | POA: Insufficient documentation

## 2022-04-04 LAB — CBC WITH DIFFERENTIAL (CANCER CENTER ONLY)
Abs Immature Granulocytes: 0.01 10*3/uL (ref 0.00–0.07)
Basophils Absolute: 0 10*3/uL (ref 0.0–0.1)
Basophils Relative: 1 %
Eosinophils Absolute: 0 10*3/uL (ref 0.0–0.5)
Eosinophils Relative: 1 %
HCT: 34.6 % — ABNORMAL LOW (ref 36.0–46.0)
Hemoglobin: 11.1 g/dL — ABNORMAL LOW (ref 12.0–15.0)
Immature Granulocytes: 0 %
Lymphocytes Relative: 16 %
Lymphs Abs: 0.4 10*3/uL — ABNORMAL LOW (ref 0.7–4.0)
MCH: 29.8 pg (ref 26.0–34.0)
MCHC: 32.1 g/dL (ref 30.0–36.0)
MCV: 92.8 fL (ref 80.0–100.0)
Monocytes Absolute: 0.3 10*3/uL (ref 0.1–1.0)
Monocytes Relative: 9 %
Neutro Abs: 2.1 10*3/uL (ref 1.7–7.7)
Neutrophils Relative %: 73 %
Platelet Count: 108 10*3/uL — ABNORMAL LOW (ref 150–400)
RBC: 3.73 MIL/uL — ABNORMAL LOW (ref 3.87–5.11)
RDW: 14 % (ref 11.5–15.5)
WBC Count: 2.8 10*3/uL — ABNORMAL LOW (ref 4.0–10.5)
nRBC: 0 % (ref 0.0–0.2)

## 2022-04-04 LAB — CMP (CANCER CENTER ONLY)
ALT: 11 U/L (ref 0–44)
AST: 16 U/L (ref 15–41)
Albumin: 4.2 g/dL (ref 3.5–5.0)
Alkaline Phosphatase: 42 U/L (ref 38–126)
Anion gap: 8 (ref 5–15)
BUN: 23 mg/dL (ref 8–23)
CO2: 29 mmol/L (ref 22–32)
Calcium: 9.3 mg/dL (ref 8.9–10.3)
Chloride: 103 mmol/L (ref 98–111)
Creatinine: 0.91 mg/dL (ref 0.44–1.00)
GFR, Estimated: >60 mL/min (ref >60)
Glucose, Bld: 115 mg/dL — ABNORMAL HIGH (ref 70–99)
Potassium: 4 mmol/L (ref 3.5–5.1)
Sodium: 140 mmol/L (ref 135–145)
Total Bilirubin: 0.6 mg/dL (ref 0.3–1.2)
Total Protein: 6.1 g/dL — ABNORMAL LOW (ref 6.5–8.1)

## 2022-04-04 LAB — LACTATE DEHYDROGENASE: LDH: 201 U/L — ABNORMAL HIGH (ref 98–192)

## 2022-04-04 MED ORDER — SODIUM CHLORIDE 0.9 % IV SOLN
600.0000 mg/m2 | Freq: Once | INTRAVENOUS | Status: AC
  Administered 2022-04-04: 1240 mg via INTRAVENOUS
  Filled 2022-04-04: qty 50

## 2022-04-04 MED ORDER — PEGFILGRASTIM 6 MG/0.6ML ~~LOC~~ PSKT
6.0000 mg | PREFILLED_SYRINGE | Freq: Once | SUBCUTANEOUS | Status: AC
  Administered 2022-04-04: 6 mg via SUBCUTANEOUS
  Filled 2022-04-04: qty 0.6

## 2022-04-04 MED ORDER — PREDNISONE 20 MG PO TABS
ORAL_TABLET | ORAL | 0 refills | Status: DC
  Filled 2022-04-04: qty 12, 4d supply, fill #0

## 2022-04-04 MED ORDER — FLUTICASONE PROPIONATE 0.05 % EX CREA
TOPICAL_CREAM | CUTANEOUS | 3 refills | Status: DC
  Filled 2022-04-04: qty 15, 7d supply, fill #0
  Filled 2022-04-05: qty 45, 23d supply, fill #0

## 2022-04-04 MED ORDER — SODIUM CHLORIDE 0.9 % IV SOLN: Freq: Once | INTRAVENOUS | Status: AC

## 2022-04-04 MED ORDER — SODIUM CHLORIDE 0.9 % IV SOLN
50.0000 mg/m2 | Freq: Once | INTRAVENOUS | Status: AC
  Administered 2022-04-04: 100 mg via INTRAVENOUS
  Filled 2022-04-04: qty 5

## 2022-04-04 MED ORDER — PALONOSETRON HCL INJECTION 0.25 MG/5ML
0.2500 mg | Freq: Once | INTRAVENOUS | Status: AC
  Administered 2022-04-04: 0.25 mg via INTRAVENOUS
  Filled 2022-04-04: qty 5

## 2022-04-04 MED ORDER — SODIUM CHLORIDE 0.9 % IV SOLN
375.0000 mg/m2 | Freq: Once | INTRAVENOUS | Status: AC
  Administered 2022-04-04: 800 mg via INTRAVENOUS
  Filled 2022-04-04: qty 50

## 2022-04-04 MED ORDER — DIPHENHYDRAMINE HCL 25 MG PO CAPS
25.0000 mg | ORAL_CAPSULE | Freq: Once | ORAL | Status: AC
  Administered 2022-04-04: 25 mg via ORAL
  Filled 2022-04-04: qty 1

## 2022-04-04 MED ORDER — SODIUM CHLORIDE 0.9 % IV SOLN
10.0000 mg | Freq: Once | INTRAVENOUS | Status: AC
  Administered 2022-04-04: 10 mg via INTRAVENOUS
  Filled 2022-04-04: qty 1

## 2022-04-04 MED ORDER — VINCRISTINE SULFATE CHEMO INJECTION 1 MG/ML
1.0000 mg | Freq: Once | INTRAVENOUS | Status: AC
  Administered 2022-04-04: 1 mg via INTRAVENOUS
  Filled 2022-04-04: qty 1

## 2022-04-04 MED ORDER — ACETAMINOPHEN 325 MG PO TABS
650.0000 mg | ORAL_TABLET | Freq: Once | ORAL | Status: AC
  Administered 2022-04-04: 650 mg via ORAL
  Filled 2022-04-04: qty 2

## 2022-04-04 MED ORDER — HYDROCODONE-ACETAMINOPHEN 5-325 MG PO TABS: ORAL_TABLET | ORAL | 0 refills | Status: DC

## 2022-04-04 NOTE — Telephone Encounter (Signed)
Requesting: [employer] Contract:  11/04/18 UDS: n/a Last Visit: 02/01/22 Next Visit: 05/03/22 Last Refill: 03/02/22 (180,0)  Please Advise. Med pending

## 2022-04-04 NOTE — Progress Notes (Signed)
Patient seen by Lisa Thomas NP today  Vitals are within treatment parameters.  Labs reviewed by Lisa Thomas NP and are within treatment parameters.  Per physician team, patient is ready for treatment and there are NO modifications to the treatment plan.     

## 2022-04-04 NOTE — Progress Notes (Signed)
[place] CSW Progress Note  Holiday representative met with patient and her daughter, [name], to assess needs.  She requested a MOST form to review with her sister and mother.  She will discuss with Dr. Benay Spice during her next visit.  Also began an application for [id number] grant from the [employer] to assist with expenses.  Provided active listening and supportive counseling.    Rodman Pickle Ason Heslin, LCSW

## 2022-04-04 NOTE — Progress Notes (Signed)
[place] OFFICE PROGRESS NOTE   Diagnosis: Non-Hodgkin's lymphoma  INTERVAL HISTORY:   [name] returns as scheduled.  She completed cycle 5 CEOP/Rituxan 03/14/2022.  She had a few episodes of nausea, no vomiting.  No mouth sores.  1 episode of constipation.  No rash.  No fever.  No shortness of breath.  Stable intermittent numbness/tingling in the right hand.  Stable left hip pain.  She takes hydrocodone as needed.  Objective:  Vital signs in last 24 hours:  Blood pressure (!) 119/50, pulse 60, temperature 98.2 F (36.8 C), temperature source Oral, resp. rate 18, height '5\' 6"'$  (1.676 m), weight 200 lb (90.7 kg), SpO2 95 %.    HEENT: No thrush or ulcers. Lymphatics: No palpable cervical, supraclavicular or axillary lymph nodes. Resp: Lungs clear bilaterally. Cardio: Regular rate and rhythm. GI: Abdomen soft and nontender.  No hepatosplenomegaly. Vascular: Trace edema lower leg bilaterally.  Chronic stasis change. Neuro: Alert and oriented. Breast: No right breast edema or erythema.   Lab Results:  Lab Results  Component Value Date   WBC 2.8 (L) 04/04/2022   HGB 11.1 (L) 04/04/2022   HCT 34.6 (L) 04/04/2022   MCV 92.8 04/04/2022   PLT 108 (L) 04/04/2022   NEUTROABS 2.1 04/04/2022    Imaging:  No results found.  Medications: I have reviewed the patient's current medications.  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 Severe microcytic anemia, status post transfusion with packed red blood cells 07/23/2014   Normal ferritin, low transferrin saturation, "scant" bone marrow iron stores, review of peripheral blood smear consistent with iron deficiency anemia   colonoscopy and upper endoscopy 09/23/2014 3. Exertional dyspnea /orthopnea-most likely secondary to congestive heart failure   4. History of B-12 deficiency   5. Rash over the trunk and extremities 08/18/2014. Appears consistent with a drug rash. Resolved 08/28/2014. Rash 11/18/2014, likely secondary to a contrast allergy  , diagnostic contrast will be listed as an allergy 6. Colonoscopy 06/03/2008. Medium sized internal hemorrhoids.   7. Admission with atrial flutter with a rapid ventricular response 10/15/2014-status post cardioversion 10/16/2014, maintained on     apixaban Repeat cardioversion 11/30/2014   8. Right olecranon skin lesion-likely a lipoma or cyst 9. 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  Large right axillary mass 11/24/2021 CT with numerous bulky matted appearing right axillary lymph nodes, largest discretely measurable node measuring 4.6 x 4.3 cm.  Severe edema of the right breast and right chest wall.  No lymphadenopathy or metastatic disease in the abdomen or pelvis.   Bilateral mammogram and ultrasound right breast 11/29/2021-suspicious mass possibly a replaced lymph node right axilla measuring 4.3 cm.  At least 1 additional abnormal lymph node with cortical thickening identified in the right axilla.  Diffuse right breast skin thickening and increased trabeculation.   Biopsy right axillary node 11/29/2021-high-grade B-cell lymphoma with proliferation rate approaching 100%,  29% of the cells kappa restricted CD10 positive B cells Cycle 1 CEOP-Rituxan 12/06/2021 Cycle 2 CEOP-Rituxan 12/27/2021, Udenyca Cycle 3 CEOP-Rituxan 01/18/2022, Udenyca Cycle  4 CEOP-Rituxan 02/08/2022, Udenyca Restaging chest CT  02/23/2022-right axilla is incompletely imaged but there appears to have been substantial decrease in size of bulky right axillary and subpectoral lymphadenopathy.  Matted soft tissue remains in this vicinity without discretely measurable lymph nodes.  No other enlarged mediastinal, hilar or axillary lymph nodes.  Interval resolution of previously seen soft tissue edema involving the right breast and chest wall. Cycle 5 CEOP-Rituxan 03/14/2022 Cycle 6 CEOP-Rituxan 04/04/2022   11.  Left lower extremity cellulitis 12/21/2021.  Improved since beginning doxycycline 12/19/2021.  14-day course planned.  Disposition: Amanda Sellers appears stable.  She has completed 5 cycles of CEOP-Rituxan.  Plan to proceed with the sixth and final cycle today as scheduled.  CBC reviewed.  Counts adequate to proceed with treatment.  She will again receive white cell growth factor support.  She will return for lab and follow-up in approximately 4 weeks.  We are available to see her sooner if needed.    Ned Card ANP/GNP-BC   04/04/2022  8:39 AM

## 2022-04-04 NOTE — Patient Instructions (Addendum)
OK to remove Neulasta injector after 7:00 pm  [facility]   Discharge Instructions: Thank you for choosing [facility] to provide your oncology and hematology care.   If you have a lab appointment with the [facility], please go directly to the [facility] and check in at the registration area.   Wear comfortable clothing and clothing appropriate for easy access to any Portacath or PICC line.   We strive to give you quality time with your provider. You may need to reschedule your appointment if you arrive late (15 or more minutes).  Arriving late affects you and other patients whose appointments are after yours.  Also, if you miss three or more appointments without notifying the office, you may be dismissed from the clinic at the provider's discretion.      For prescription refill requests, have your pharmacy contact our office and allow 72 hours for refills to be completed.    Today you received the following chemotherapy and/or immunotherapy agents Vincristine,Cytoxan, Etoposide, Rituximab.      To help prevent nausea and vomiting after your treatment, we encourage you to take your nausea medication as directed.  BELOW ARE SYMPTOMS THAT SHOULD BE REPORTED IMMEDIATELY: *FEVER GREATER THAN 100.4 F (38 C) OR HIGHER *CHILLS OR SWEATING *NAUSEA AND VOMITING THAT IS NOT CONTROLLED WITH YOUR NAUSEA MEDICATION *UNUSUAL SHORTNESS OF BREATH *UNUSUAL BRUISING OR BLEEDING *URINARY PROBLEMS (pain or burning when urinating, or frequent urination) *BOWEL PROBLEMS (unusual diarrhea, constipation, pain near the anus) TENDERNESS IN MOUTH AND THROAT WITH OR WITHOUT PRESENCE OF ULCERS (sore throat, sores in mouth, or a toothache) UNUSUAL RASH, SWELLING OR PAIN  UNUSUAL VAGINAL DISCHARGE OR ITCHING   Items with * indicate a potential emergency and should be followed up as soon as possible or go to the Emergency Department if any problems should  occur.  Please show the CHEMOTHERAPY ALERT CARD or IMMUNOTHERAPY ALERT CARD at check-in to the Emergency Department and triage nurse.  Should you have questions after your visit or need to cancel or reschedule your appointment, please contact [facility]  Dept: [phone]  and follow the prompts.  Office hours are 8:00 a.m. to 4:30 p.m. Monday - Friday. Please note that voicemails left after 4:00 p.m. may not be returned until the following business day.  We are closed weekends and major holidays. You have access to a nurse at all times for urgent questions. Please call the main number to the clinic Dept: [phone] and follow the prompts.   For any non-urgent questions, you may also contact your provider using MyChart. We now offer e-Visits for anyone 39 and older to request care online for non-urgent symptoms. For details visit [email or web address].   Also download the MyChart app! Go to the app store, search "MyChart", open the app, select [facility], and log in with your MyChart username and password.  Vincristine Injection What is this medication? VINCRISTINE (vin KRIS teen) treats some types of cancer. It works by slowing down the growth of cancer cells. This medicine may be used for other purposes; ask your health care provider or pharmacist if you have questions. COMMON BRAND NAME(S): Oncovin, Vincasar PFS What should I tell my care team before I take this medication? They need to know if you have any of these conditions: Infection Kidney disease Liver disease Low white blood cell levels Lung disease Nervous system disease, such as Charcot-Marie-Tooth (CMT) Recent or ongoing radiation  therapy An unusual or allergic reaction to vincristine, other chemotherapy agents, other medications, foods, dyes, or preservatives Pregnant or trying to get pregnant Breast-feeding How should I use this medication? This medication is infused into a vein.  It is given by your care team in a hospital or clinic setting. Talk to your care team about the use of this medication in children. While it may be given to children for selected conditions, precautions do apply. Overdosage: If you think you have taken too much of this medicine contact a poison control center or emergency room at once. NOTE: This medicine is only for you. Do not share this medicine with others. What if I miss a dose? Keep appointments for follow-up doses. It is important not to miss your dose. Call your care team if you are unable to keep an appointment. What may interact with this medication? Do not take this medication with any of the following: Live virus vaccines This medication may also interact with the following: Medications for fungal infections, such as itraconazole or fluconazole Phenytoin Supplements, such as St. John's wort This list may not describe all possible interactions. Give your health care provider a list of all the medicines, herbs, non-prescription drugs, or dietary supplements you use. Also tell them if you smoke, drink alcohol, or use illegal drugs. Some items may interact with your medicine. What should I watch for while using this medication? Your condition will be monitored carefully while you are receiving this medication. This medication may make you feel generally unwell. This is not uncommon as chemotherapy can affect healthy cells as well as cancer cells. Report any side effects. Continue your course of treatment even though you feel ill unless your care team tells you to stop. You may need blood work while taking this medication. This medication may increase your risk to bruise or bleed. Call your care team if you notice any unusual bleeding. This medication may increase your risk of getting an infection. Call your care team for advice if you get a fever, chills, sore throat, or other symptoms of a cold or flu. Do not treat yourself. Try to avoid  being around people who are sick. This medication will cause constipation. If you do not have a bowel movement for 3 days, call your care team. Call your care team if you are around anyone with measles, chickenpox, or if you develop sores or blisters that do not heal properly. Be careful brushing or flossing your teeth or using a toothpick because you may get an infection or bleed more easily. If you have any dental work done, tell your dentist you are receiving this medication Talk to your care team if you or your partner wish to become pregnant or think either of you might be pregnant. This medication can cause serious birth defects. This medication may cause infertility. Talk to your care team if you are concerned about your fertility. Talk to your care team before breastfeeding. Changes to your treatment plan may be needed. What side effects may I notice from receiving this medication? Side effects that you should report to your care team as soon as possible: Allergic reactions--skin rash, itching, hives, swelling of the face, lips, tongue, or throat High uric acid level--severe pain, redness, warmth, or swelling in joints, pain or trouble passing urine, pain in the lower back or sides Infection--fever, chills, cough, sore throat, wounds that don't heal, pain or trouble when passing urine, general feeling of discomfort or being unwell Pain, tingling, or  numbness in the hands or feet, muscle weakness, change in vision, confusion or trouble speaking, loss of balance or coordination, trouble walking, seizures Painful swelling, warmth, or redness of the skin, blisters or sores at the infusion site Shortness of breath or trouble breathing Side effects that usually do not require medical attention (report to your care team if they continue or are bothersome): Constipation Diarrhea Hair loss Loss of appetite Nausea Stomach cramping Vomiting This list may not describe all possible side effects.  Call your doctor for medical advice about side effects. You may report side effects to FDA at 1-800-FDA-1088. Where should I keep my medication? This medication is given in a hospital or clinic. It will not be stored at home. NOTE: This sheet is a summary. It may not cover all possible information. If you have questions about this medicine, talk to your doctor, pharmacist, or health care provider.  2023 Elsevier/Gold Standard (2021-04-05 00:00:00)  Cyclophosphamide Injection What is this medication? CYCLOPHOSPHAMIDE (sye kloe FOSS fa mide) treats some types of cancer. It works by slowing down the growth of cancer cells. This medicine may be used for other purposes; ask your health care provider or pharmacist if you have questions. COMMON BRAND NAME(S): Cyclophosphamide, Cytoxan, Neosar What should I tell my care team before I take this medication? They need to know if you have any of these conditions: Heart disease Irregular heartbeat or rhythm Infection Kidney problems Liver disease Low blood cell levels (white cells, platelets, or red blood cells) Lung disease Previous radiation Trouble passing urine An unusual or allergic reaction to cyclophosphamide, other medications, foods, dyes, or preservatives Pregnant or trying to get pregnant Breast-feeding How should I use this medication? This medication is injected into a vein. It is given by your care team in a hospital or clinic setting. Talk to your care team about the use of this medication in children. Special care may be needed. Overdosage: If you think you have taken too much of this medicine contact a poison control center or emergency room at once. NOTE: This medicine is only for you. Do not share this medicine with others. What if I miss a dose? Keep appointments for follow-up doses. It is important not to miss your dose. Call your care team if you are unable to keep an appointment. What may interact with this  medication? Amphotericin B Amiodarone Azathioprine Certain antivirals for HIV or hepatitis Certain medications for blood pressure, such as enalapril, lisinopril, quinapril Cyclosporine Diuretics Etanercept Indomethacin Medications that relax muscles Metronidazole Natalizumab Tamoxifen Warfarin This list may not describe all possible interactions. Give your health care provider a list of all the medicines, herbs, non-prescription drugs, or dietary supplements you use. Also tell them if you smoke, drink alcohol, or use illegal drugs. Some items may interact with your medicine. What should I watch for while using this medication? This medication may make you feel generally unwell. This is not uncommon as chemotherapy can affect healthy cells as well as cancer cells. Report any side effects. Continue your course of treatment even though you feel ill unless your care team tells you to stop. You may need blood work while you are taking this medication. This medication may increase your risk of getting an infection. Call your care team for advice if you get a fever, chills, sore throat, or other symptoms of a cold or flu. Do not treat yourself. Try to avoid being around people who are sick. Avoid taking medications that contain aspirin, acetaminophen, ibuprofen, naproxen,  or ketoprofen unless instructed by your care team. These medications may hide a fever. Be careful brushing or flossing your teeth or using a toothpick because you may get an infection or bleed more easily. If you have any dental work done, tell your dentist you are receiving this medication. Drink water or other fluids as directed. Urinate often, even at night. Some products may contain alcohol. Ask your care team if this medication contains alcohol. Be sure to tell all care teams you are taking this medicine. Certain medicines, like metronidazole and disulfiram, can cause an unpleasant reaction when taken with alcohol. The reaction  includes flushing, headache, nausea, vomiting, sweating, and increased thirst. The reaction can last from 30 minutes to several hours. Talk to your care team if you wish to become pregnant or think you might be pregnant. This medication can cause serious birth defects if taken during pregnancy and for 1 year after the last dose. A negative pregnancy test is required before starting this medication. A reliable form of contraception is recommended while taking this medication and for 1 year after the last dose. Talk to your care team about reliable forms of contraception. Do not father a child while taking this medication and for 4 months after the last dose. Use a condom during this time period. Do not breast-feed while taking this medication or for 1 week after the last dose. This medication may cause infertility. Talk to your care team if you are concerned about your fertility. Talk to your care team about your risk of cancer. You may be more at risk for certain types of cancer if you take this medication. What side effects may I notice from receiving this medication? Side effects that you should report to your care team as soon as possible: Allergic reactions--skin rash, itching, hives, swelling of the face, lips, tongue, or throat Dry cough, shortness of breath or trouble breathing Heart failure--shortness of breath, swelling of the ankles, feet, or hands, sudden weight gain, unusual weakness or fatigue Heart muscle inflammation--unusual weakness or fatigue, shortness of breath, chest pain, fast or irregular heartbeat, dizziness, swelling of the ankles, feet, or hands Heart rhythm changes--fast or irregular heartbeat, dizziness, feeling faint or lightheaded, chest pain, trouble breathing Infection--fever, chills, cough, sore throat, wounds that don't heal, pain or trouble when passing urine, general feeling of discomfort or being unwell Kidney injury--decrease in the amount of urine, swelling of the  ankles, hands, or feet Liver injury--right upper belly pain, loss of appetite, nausea, light-colored stool, dark yellow or brown urine, yellowing skin or eyes, unusual weakness or fatigue Low red blood cell level--unusual weakness or fatigue, dizziness, headache, trouble breathing Low sodium level--muscle weakness, fatigue, dizziness, headache, confusion Red or dark brown urine Unusual bruising or bleeding Side effects that usually do not require medical attention (report to your care team if they continue or are bothersome): Hair loss Irregular menstrual cycles or spotting Loss of appetite Nausea Pain, redness, or swelling with sores inside the mouth or throat Vomiting This list may not describe all possible side effects. Call your doctor for medical advice about side effects. You may report side effects to FDA at 1-800-FDA-1088. Where should I keep my medication? This medication is given in a hospital or clinic. It will not be stored at home. NOTE: This sheet is a summary. It may not cover all possible information. If you have questions about this medicine, talk to your doctor, pharmacist, or health care provider.  2023 Elsevier/Gold Standard (2021-03-01 00:00:00)  Etoposide Injection What is this medication? ETOPOSIDE (e toe POE side) treats some types of cancer. It works by slowing down the growth of cancer cells. This medicine may be used for other purposes; ask your health care provider or pharmacist if you have questions. COMMON BRAND NAME(S): Etopophos, Toposar, VePesid What should I tell my care team before I take this medication? They need to know if you have any of these conditions: Infection Kidney disease Liver disease Low blood counts, such as low white cell, platelet, red cell counts An unusual or allergic reaction to etoposide, other medications, foods, dyes, or preservatives If you or your partner are pregnant or trying to get pregnant Breastfeeding How should I use  this medication? This medication is injected into a vein. It is given by your care team in a hospital or clinic setting. Talk to your care team about the use of this medication in children. Special care may be needed. Overdosage: If you think you have taken too much of this medicine contact a poison control center or emergency room at once. NOTE: This medicine is only for you. Do not share this medicine with others. What if I miss a dose? Keep appointments for follow-up doses. It is important not to miss your dose. Call your care team if you are unable to keep an appointment. What may interact with this medication? Warfarin This list may not describe all possible interactions. Give your health care provider a list of all the medicines, herbs, non-prescription drugs, or dietary supplements you use. Also tell them if you smoke, drink alcohol, or use illegal drugs. Some items may interact with your medicine. What should I watch for while using this medication? Your condition will be monitored carefully while you are receiving this medication. This medication may make you feel generally unwell. This is not uncommon as chemotherapy can affect healthy cells as well as cancer cells. Report any side effects. Continue your course of treatment even though you feel ill unless your care team tells you to stop. This medication can cause serious side effects. To reduce the risk, your care team may give you other medications to take before receiving this one. Be sure to follow the directions from your care team. This medication may increase your risk of getting an infection. Call your care team for advice if you get a fever, chills, sore throat, or other symptoms of a cold or flu. Do not treat yourself. Try to avoid being around people who are sick. This medication may increase your risk to bruise or bleed. Call your care team if you notice any unusual bleeding. Talk to your care team about your risk of cancer. You  may be more at risk for certain types of cancers if you take this medication. Talk to your care team if you may be pregnant. Serious birth defects can occur if you take this medication during pregnancy and for 6 months after the last dose. You will need a negative pregnancy test before starting this medication. Contraception is recommended while taking this medication and for 6 months after the last dose. Your care team can help you find the option that works for you. If your partner can get pregnant, use a condom during sex while taking this medication and for 4 months after the last dose. Do not breastfeed while taking this medication. This medication may cause infertility. Talk to your care team if you are concerned about your fertility. What side effects may I notice from receiving this  medication? Side effects that you should report to your care team as soon as possible: Allergic reactions--skin rash, itching, hives, swelling of the face, lips, tongue, or throat Infection--fever, chills, cough, sore throat, wounds that don't heal, pain or trouble when passing urine, general feeling of discomfort or being unwell Low red blood cell level--unusual weakness or fatigue, dizziness, headache, trouble breathing Unusual bruising or bleeding Side effects that usually do not require medical attention (report to your care team if they continue or are bothersome): Diarrhea Fatigue Hair loss Loss of appetite Nausea Vomiting This list may not describe all possible side effects. Call your doctor for medical advice about side effects. You may report side effects to FDA at 1-800-FDA-1088. Where should I keep my medication? This medication is given in a hospital or clinic. It will not be stored at home. NOTE: This sheet is a summary. It may not cover all possible information. If you have questions about this medicine, talk to your doctor, pharmacist, or health care provider.  2023 Elsevier/Gold Standard  (2007-03-02 00:00:00)  Rituximab Injection What is this medication? RITUXIMAB (ri TUX i mab) treats leukemia and lymphoma. It works by blocking a protein that causes cancer cells to grow and multiply. This helps to slow or stop the spread of cancer cells. It may also be used to treat autoimmune conditions, such as arthritis. It works by slowing down an overactive immune system. It is a monoclonal antibody. This medicine may be used for other purposes; ask your health care provider or pharmacist if you have questions. COMMON BRAND NAME(S): RIABNI, Rituxan, RUXIENCE, truxima What should I tell my care team before I take this medication? They need to know if you have any of these conditions: Chest pain Heart disease Immune system problems Infection, such as chickenpox, cold sores, hepatitis B, herpes Irregular heartbeat or rhythm Kidney disease Low blood counts, such as low white cells, platelets, red cells Lung disease Recent or upcoming vaccine An unusual or allergic reaction to rituximab, other medications, foods, dyes, or preservatives Pregnant or trying to get pregnant Breast-feeding How should I use this medication? This medication is injected into a vein. It is given by a care team in a hospital or clinic setting. A special MedGuide will be given to you before each treatment. Be sure to read this information carefully each time. Talk to your care team about the use of this medication in children. While this medication may be prescribed for children as young as 6 months for selected conditions, precautions do apply. Overdosage: If you think you have taken too much of this medicine contact a poison control center or emergency room at once. NOTE: This medicine is only for you. Do not share this medicine with others. What if I miss a dose? Keep appointments for follow-up doses. It is important not to miss your dose. Call your care team if you are unable to keep an appointment. What may  interact with this medication? Do not take this medication with any of the following: Live vaccines This medication may also interact with the following: Cisplatin This list may not describe all possible interactions. Give your health care provider a list of all the medicines, herbs, non-prescription drugs, or dietary supplements you use. Also tell them if you smoke, drink alcohol, or use illegal drugs. Some items may interact with your medicine. What should I watch for while using this medication? Your condition will be monitored carefully while you are receiving this medication. You may need blood work  while taking this medication. This medication can cause serious infusion reactions. To reduce the risk your care team may give you other medications to take before receiving this one. Be sure to follow the directions from your care team. This medication may increase your risk of getting an infection. Call your care team for advice if you get a fever, chills, sore throat, or other symptoms of a cold or flu. Do not treat yourself. Try to avoid being around people who are sick. Call your care team if you are around anyone with measles, chickenpox, or if you develop sores or blisters that do not heal properly. Avoid taking medications that contain aspirin, acetaminophen, ibuprofen, naproxen, or ketoprofen unless instructed by your care team. These medications may hide a fever. This medication may cause serious skin reactions. They can happen weeks to months after starting the medication. Contact your care team right away if you notice fevers or flu-like symptoms with a rash. The rash may be red or purple and then turn into blisters or peeling of the skin. You may also notice a red rash with swelling of the face, lips, or lymph nodes in your neck or under your arms. In some patients, this medication may cause a serious brain infection that may cause death. If you have any problems seeing, thinking,  speaking, walking, or standing, tell your care team right away. If you cannot reach your care team, urgently seek another source of medical care. Talk to your care team if you may be pregnant. Serious birth defects can occur if you take this medication during pregnancy and for 12 months after the last dose. You will need a negative pregnancy test before starting this medication. Contraception is recommended while taking this medication and for 12 months after the last dose. Your care team can help you find the option that works for you. Do not breastfeed while taking this medication and for at least 6 months after the last dose. What side effects may I notice from receiving this medication? Side effects that you should report to your care team as soon as possible: Allergic reactions or angioedema--skin rash, itching or hives, swelling of the face, eyes, lips, tongue, arms, or legs, trouble swallowing or breathing Bowel blockage--stomach cramping, unable to have a bowel movement or pass gas, loss of appetite, vomiting Dizziness, loss of balance or coordination, confusion or trouble speaking Heart attack--pain or tightness in the chest, shoulders, arms, or jaw, nausea, shortness of breath, cold or clammy skin, feeling faint or lightheaded Heart rhythm changes--fast or irregular heartbeat, dizziness, feeling faint or lightheaded, chest pain, trouble breathing Infection--fever, chills, cough, sore throat, wounds that don't heal, pain or trouble when passing urine, general feeling of discomfort or being unwell Infusion reactions--chest pain, shortness of breath or trouble breathing, feeling faint or lightheaded Kidney injury--decrease in the amount of urine, swelling of the ankles, hands, or feet Liver injury--right upper belly pain, loss of appetite, nausea, light-colored stool, dark yellow or brown urine, yellowing skin or eyes, unusual weakness or fatigue Redness, blistering, peeling, or loosening of the  skin, including inside the mouth Stomach pain that is severe, does not go away, or gets worse Tumor lysis syndrome (TLS)--nausea, vomiting, diarrhea, decrease in the amount of urine, dark urine, unusual weakness or fatigue, confusion, muscle pain or cramps, fast or irregular heartbeat, joint pain Side effects that usually do not require medical attention (report to your care team if they continue or are bothersome): Headache Joint pain Nausea Runny or  stuffy nose Unusual weakness or fatigue This list may not describe all possible side effects. Call your doctor for medical advice about side effects. You may report side effects to FDA at 1-800-FDA-1088. Where should I keep my medication? This medication is given in a hospital or clinic. It will not be stored at home. NOTE: This sheet is a summary. It may not cover all possible information. If you have questions about this medicine, talk to your doctor, pharmacist, or health care provider.  2023 Elsevier/Gold Standard (2021-05-24 00:00:00)  Pegfilgrastim Injection What is this medication? PEGFILGRASTIM (PEG fil gra stim) lowers the risk of infection in people who are receiving chemotherapy. It works by Building control surveyor make more white blood cells, which protects your body from infection. It may also be used to help people who have been exposed to high doses of radiation. This medicine may be used for other purposes; ask your health care provider or pharmacist if you have questions. COMMON BRAND NAME(S): [employer], Neulasta, Nyvepria, Stimufend, UDENYCA, Ziextenzo What should I tell my care team before I take this medication? They need to know if you have any of these conditions: Kidney disease Latex allergy Ongoing radiation therapy Sickle cell disease Skin reactions to acrylic adhesives (On-Body Injector only) An unusual or allergic reaction to pegfilgrastim, filgrastim, other medications, foods, dyes, or preservatives Pregnant  or trying to get pregnant Breast-feeding How should I use this medication? This medication is for injection under the skin. If you get this medication at home, you will be taught how to prepare and give the pre-filled syringe or how to use the On-body Injector. Refer to the patient Instructions for Use for detailed instructions. Use exactly as directed. Tell your care team immediately if you suspect that the On-body Injector may not have performed as intended or if you suspect the use of the On-body Injector resulted in a missed or partial dose. It is important that you put your used needles and syringes in a special sharps container. Do not put them in a trash can. If you do not have a sharps container, call your pharmacist or care team to get one. Talk to your care team about the use of this medication in children. While this medication may be prescribed for selected conditions, precautions do apply. Overdosage: If you think you have taken too much of this medicine contact a poison control center or emergency room at once. NOTE: This medicine is only for you. Do not share this medicine with others. What if I miss a dose? It is important not to miss your dose. Call your care team if you miss your dose. If you miss a dose due to an On-body Injector failure or leakage, a new dose should be administered as soon as possible using a single prefilled syringe for manual use. What may interact with this medication? Interactions have not been studied. This list may not describe all possible interactions. Give your health care provider a list of all the medicines, herbs, non-prescription drugs, or dietary supplements you use. Also tell them if you smoke, drink alcohol, or use illegal drugs. Some items may interact with your medicine. What should I watch for while using this medication? Your condition will be monitored carefully while you are receiving this medication. You may need blood work done while you are  taking this medication. Talk to your care team about your risk of cancer. You may be more at risk for certain types of cancer if you take this  medication. If you are going to need a MRI, CT scan, or other procedure, tell your care team that you are using this medication (On-Body Injector only). What side effects may I notice from receiving this medication? Side effects that you should report to your care team as soon as possible: Allergic reactions--skin rash, itching, hives, swelling of the face, lips, tongue, or throat Capillary leak syndrome--stomach or muscle pain, unusual weakness or fatigue, feeling faint or lightheaded, decrease in the amount of urine, swelling of the ankles, hands, or feet, trouble breathing High white blood cell level--fever, fatigue, trouble breathing, night sweats, change in vision, weight loss Inflammation of the aorta--fever, fatigue, back, chest, or stomach pain, severe headache Kidney injury (glomerulonephritis)--decrease in the amount of urine, red or dark brown urine, foamy or bubbly urine, swelling of the ankles, hands, or feet Shortness of breath or trouble breathing Spleen injury--pain in upper left stomach or shoulder Unusual bruising or bleeding Side effects that usually do not require medical attention (report to your care team if they continue or are bothersome): Bone pain Pain in the hands or feet This list may not describe all possible side effects. Call your doctor for medical advice about side effects. You may report side effects to FDA at 1-800-FDA-1088. Where should I keep my medication? Keep out of the reach of children. If you are using this medication at home, you will be instructed on how to store it. Throw away any unused medication after the expiration date on the label. NOTE: This sheet is a summary. It may not cover all possible information. If you have questions about this medicine, talk to your doctor, pharmacist, or health care  provider.  2023 Elsevier/Gold Standard (2020-09-24 00:00:00)

## 2022-04-05 ENCOUNTER — Other Ambulatory Visit (HOSPITAL_BASED_OUTPATIENT_CLINIC_OR_DEPARTMENT_OTHER): Payer: Self-pay

## 2022-04-27 ENCOUNTER — Other Ambulatory Visit (HOSPITAL_BASED_OUTPATIENT_CLINIC_OR_DEPARTMENT_OTHER): Payer: Self-pay

## 2022-04-27 ENCOUNTER — Telehealth: Payer: Self-pay | Admitting: Family Medicine

## 2022-04-27 DIAGNOSIS — C8514 Unspecified B-cell lymphoma, lymph nodes of axilla and upper limb: Secondary | ICD-10-CM

## 2022-04-27 MED ORDER — DICLOFENAC SODIUM 1 % EX GEL: CUTANEOUS | 1 refills | Status: DC

## 2022-04-27 MED ORDER — FLUTICASONE PROPIONATE 0.05 % EX CREA
TOPICAL_CREAM | CUTANEOUS | 3 refills | Status: DC
Start: 2022-04-27 — End: 2022-07-17

## 2022-04-27 NOTE — Telephone Encounter (Signed)
Pt's daughter [name] called and is reporting having trouble getting two medications filled. She is requesting that a new prescription be written and sent to [facility] in [facility] for diclofenac Sodium (VOLTAREN) 1 % GEL   fluticasone (CUTIVATE) 0.05 % cream this cream she is also requesting that they receive the large amount that was for the original prescription written by Dr. Anitra Lauth since it was a large area.  Please give the patients daughter [name] a call at [phone].

## 2022-04-27 NOTE — Addendum Note (Signed)
Addended by: Deveron Furlong D on: 04/27/2022 11:37 AM   Modules accepted: Orders

## 2022-04-27 NOTE — Telephone Encounter (Signed)
Pt's daughter advised of medication refills.

## 2022-05-02 ENCOUNTER — Inpatient Hospital Stay: Payer: PPO | Attending: Oncology

## 2022-05-02 ENCOUNTER — Inpatient Hospital Stay: Payer: PPO | Admitting: Oncology

## 2022-05-02 VITALS — BP 121/77 | HR 91 | Temp 98.1°F | Resp 18 | Ht 66.0 in | Wt 199.0 lb

## 2022-05-02 DIAGNOSIS — C8514 Unspecified B-cell lymphoma, lymph nodes of axilla and upper limb: Secondary | ICD-10-CM

## 2022-05-02 DIAGNOSIS — R110 Nausea: Secondary | ICD-10-CM | POA: Insufficient documentation

## 2022-05-02 DIAGNOSIS — Z8572 Personal history of non-Hodgkin lymphomas: Secondary | ICD-10-CM | POA: Diagnosis not present

## 2022-05-02 DIAGNOSIS — Z9221 Personal history of antineoplastic chemotherapy: Secondary | ICD-10-CM | POA: Insufficient documentation

## 2022-05-02 LAB — CBC WITH DIFFERENTIAL (CANCER CENTER ONLY)
Abs Immature Granulocytes: 0.01 10*3/uL (ref 0.00–0.07)
Basophils Absolute: 0 10*3/uL (ref 0.0–0.1)
Basophils Relative: 0 %
Eosinophils Absolute: 0.1 10*3/uL (ref 0.0–0.5)
Eosinophils Relative: 3 %
HCT: 37.3 % (ref 36.0–46.0)
Hemoglobin: 11.9 g/dL — ABNORMAL LOW (ref 12.0–15.0)
Immature Granulocytes: 0 %
Lymphocytes Relative: 19 %
Lymphs Abs: 0.5 10*3/uL — ABNORMAL LOW (ref 0.7–4.0)
MCH: 30 pg (ref 26.0–34.0)
MCHC: 31.9 g/dL (ref 30.0–36.0)
MCV: 94 fL (ref 80.0–100.0)
Monocytes Absolute: 0.4 10*3/uL (ref 0.1–1.0)
Monocytes Relative: 15 %
Neutro Abs: 1.7 10*3/uL (ref 1.7–7.7)
Neutrophils Relative %: 63 %
Platelet Count: 104 10*3/uL — ABNORMAL LOW (ref 150–400)
RBC: 3.97 MIL/uL (ref 3.87–5.11)
RDW: 14.7 % (ref 11.5–15.5)
WBC Count: 2.7 10*3/uL — ABNORMAL LOW (ref 4.0–10.5)
nRBC: 0 % (ref 0.0–0.2)

## 2022-05-02 LAB — CMP (CANCER CENTER ONLY)
ALT: 12 U/L (ref 0–44)
AST: 20 U/L (ref 15–41)
Albumin: 4.6 g/dL (ref 3.5–5.0)
Alkaline Phosphatase: 49 U/L (ref 38–126)
Anion gap: 8 (ref 5–15)
BUN: 22 mg/dL (ref 8–23)
CO2: 31 mmol/L (ref 22–32)
Calcium: 9.6 mg/dL (ref 8.9–10.3)
Chloride: 104 mmol/L (ref 98–111)
Creatinine: 0.96 mg/dL (ref 0.44–1.00)
GFR, Estimated: 57 mL/min — ABNORMAL LOW (ref >60)
Glucose, Bld: 124 mg/dL — ABNORMAL HIGH (ref 70–99)
Potassium: 4.4 mmol/L (ref 3.5–5.1)
Sodium: 143 mmol/L (ref 135–145)
Total Bilirubin: 0.8 mg/dL (ref 0.3–1.2)
Total Protein: 6.1 g/dL — ABNORMAL LOW (ref 6.5–8.1)

## 2022-05-02 LAB — LACTATE DEHYDROGENASE: LDH: 243 U/L — ABNORMAL HIGH (ref 98–192)

## 2022-05-02 NOTE — Progress Notes (Signed)
[facility] OFFICE PROGRESS NOTE   Diagnosis: Non-Hodgkin's lymphoma  INTERVAL HISTORY:   Amanda Sellers returns as scheduled.  Good appetite.  No fever or night sweats.  The right breast feels normal.  No palpable lymph nodes. She reports feeling "weak "for the past several days.  He has mild nausea. Objective:  Vital signs in last 24 hours:  Blood pressure 121/77, pulse 91, temperature 98.1 F (36.7 C), temperature source Oral, resp. rate 18, height 5\' 6"  (1.676 m), weight 199 lb (90.3 kg), SpO2 97 %.     Lymphatics: No cervical, supraclavicular, axillary, or inguinal nodes. Resp: Clear bilaterally Cardio: Regular rate and rhythm GI: No hepatosplenomegaly Vascular: The right lower leg is larger than the left side, no edema  Skin: No erythema at the right breast Breast: Right breast without mass  Lab Results:  Lab Results  Component Value Date   WBC 2.7 (L) 05/02/2022   HGB 11.9 (L) 05/02/2022   HCT 37.3 05/02/2022   MCV 94.0 05/02/2022   PLT 104 (L) 05/02/2022   NEUTROABS 1.7 05/02/2022    CMP  Lab Results  Component Value Date   NA 143 05/02/2022   K 4.4 05/02/2022   CL 104 05/02/2022   CO2 31 05/02/2022   GLUCOSE 124 (H) 05/02/2022   BUN 22 05/02/2022   CREATININE 0.96 05/02/2022   CALCIUM 9.6 05/02/2022   PROT 6.1 (L) 05/02/2022   ALBUMIN 4.6 05/02/2022   AST 20 05/02/2022   ALT 12 05/02/2022   ALKPHOS 49 05/02/2022   BILITOT 0.8 05/02/2022   GFRNONAA 57 (L) 05/02/2022   GFRAA 50 (L) 05/29/2019    Medications: I have reviewed the patient's current medications.   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 Severe microcytic anemia, status post transfusion with packed red blood cells 07/23/2014   Normal ferritin, low transferrin saturation, "scant" bone marrow iron stores, review of peripheral blood smear consistent with iron deficiency anemia   colonoscopy and upper endoscopy 09/23/2014 3. Exertional dyspnea /orthopnea-most likely secondary to congestive heart failure   4. History of B-12 deficiency   5. Rash over the trunk and extremities 08/18/2014. Appears consistent with a drug rash. Resolved 08/28/2014. Rash 11/18/2014, likely secondary to a contrast allergy  , diagnostic contrast will be listed as an allergy 6. Colonoscopy 06/03/2008. Medium sized internal hemorrhoids.   7. Admission with atrial flutter with a rapid ventricular response 10/15/2014-status post cardioversion 10/16/2014, maintained on     apixaban Repeat cardioversion 11/30/2014   8. Right olecranon skin lesion-likely a lipoma or cyst 9. 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  Large right axillary mass 11/24/2021 CT with numerous bulky matted appearing right axillary lymph nodes, largest discretely measurable node measuring 4.6 x 4.3 cm.  Severe edema of the right breast and right chest wall.  No lymphadenopathy or metastatic disease in the abdomen or pelvis.   Bilateral mammogram and ultrasound right breast 11/29/2021-suspicious mass possibly a replaced lymph node right axilla measuring 4.3 cm.  At least 1 additional abnormal lymph node with cortical thickening identified in the right  axilla.  Diffuse right breast skin thickening and increased trabeculation.   Biopsy right axillary node 11/29/2021-high-grade B-cell lymphoma  with proliferation rate approaching 100%,  29% of the cells kappa restricted CD10 positive B cells Cycle 1 CEOP-Rituxan 12/06/2021 Cycle 2 CEOP-Rituxan 12/27/2021, Udenyca Cycle 3 CEOP-Rituxan 01/18/2022, Udenyca Cycle 4 CEOP-Rituxan 02/08/2022, Udenyca Restaging chest CT 02/23/2022-right axilla is incompletely imaged but there appears to have been substantial decrease in size of bulky right axillary and subpectoral lymphadenopathy.  Matted soft tissue remains in this vicinity without discretely measurable lymph nodes.  No other enlarged mediastinal, hilar or axillary lymph nodes.  Interval resolution of previously seen soft tissue edema involving the right breast and chest wall. Cycle 5 CEOP-Rituxan 03/14/2022 Cycle 6 CEOP-Rituxan 04/04/2022   11.  Left lower extremity cellulitis 12/21/2021.  Improved since beginning doxycycline 12/19/2021.  14-day course planned.    Disposition: Amanda Sellers is approximately 1 month out from completing a course of systemic therapy for treatment of non-Hodgkin's lymphoma.  She is in clinical remission from lymphoma.  She reports feeling "weak "for the past several days.  I doubt this is related to lymphoma or the course of chemotherapy.  Her symptoms may be related to heart disease.  She is scheduled to see Dr.McGowen tomorrow and cardiology next week.  She will return for an office and lab visit in approximate 6 weeks.  I am available to see her sooner as needed.  Thornton Papas, MD  05/02/2022  11:53 AM

## 2022-05-03 ENCOUNTER — Ambulatory Visit: Payer: PPO | Admitting: Family Medicine

## 2022-05-03 ENCOUNTER — Other Ambulatory Visit: Payer: Self-pay | Admitting: Family Medicine

## 2022-05-03 DIAGNOSIS — C8514 Unspecified B-cell lymphoma, lymph nodes of axilla and upper limb: Secondary | ICD-10-CM

## 2022-05-03 MED ORDER — HYDROCODONE-ACETAMINOPHEN 5-325 MG PO TABS
ORAL_TABLET | ORAL | 0 refills | Status: DC
Start: 2022-05-03 — End: 2022-05-15

## 2022-05-11 ENCOUNTER — Encounter: Payer: Self-pay | Admitting: Nurse Practitioner

## 2022-05-11 ENCOUNTER — Ambulatory Visit: Payer: PPO | Attending: Nurse Practitioner | Admitting: Nurse Practitioner

## 2022-05-11 VITALS — BP 144/57 | HR 82 | Ht 66.0 in

## 2022-05-11 DIAGNOSIS — I447 Left bundle-branch block, unspecified: Secondary | ICD-10-CM

## 2022-05-11 DIAGNOSIS — I2510 Atherosclerotic heart disease of native coronary artery without angina pectoris: Secondary | ICD-10-CM

## 2022-05-11 DIAGNOSIS — I10 Essential (primary) hypertension: Secondary | ICD-10-CM | POA: Diagnosis not present

## 2022-05-11 DIAGNOSIS — I480 Paroxysmal atrial fibrillation: Secondary | ICD-10-CM

## 2022-05-11 DIAGNOSIS — J449 Chronic obstructive pulmonary disease, unspecified: Secondary | ICD-10-CM | POA: Diagnosis not present

## 2022-05-11 DIAGNOSIS — I5032 Chronic diastolic (congestive) heart failure: Secondary | ICD-10-CM | POA: Diagnosis not present

## 2022-05-11 DIAGNOSIS — I340 Nonrheumatic mitral (valve) insufficiency: Secondary | ICD-10-CM

## 2022-05-11 NOTE — Progress Notes (Signed)
Office Visit    Patient Name: Amanda Sellers Date of Encounter: 05/11/2022  Primary Care Provider:  Jeoffrey Massed, MD Primary Cardiologist:  Bryan Lemma, MD  Chief Complaint    89 year old female with a history of paroxysmal atrial fibrillation, mild nonobstructive CAD, LBBB, chronic diastolic heart failure, mitral valve regurgitation, hypertension, COPD on nightly O2, and non-Hodgkin's lymphoma who presents for follow-up related to mitral valve regurgitation.  Past Medical History    Past Medical History:  Diagnosis Date   Anemia of chronic disease    Anticoagulation adequate, Eliquis 10/16/2014   Anxiety    clonaz helps this AND her breathing   Cholelithiasis without cholecystitis 07/2020   noted on CT abd/pelv at [facility]   Chronic combined systolic and diastolic congestive heart failure 10/16/2014   Complicated by atrial fibrillation; Echo 07/23/14: mild LVH, EF 50-55%, Gr 2 DD.;  Echo May 15, 2019: EF 35 to 40%, GRII DD.  (In setting of CHF exacerbation, class III)-> baseline class II symptoms. 09/2019 EF 55-60%, nl wall motion and LV fxn, grd II DD, mod MR.   Chronic renal insufficiency, stage III (moderate)    GFR @ 50   COPD (chronic obstructive pulmonary disease)    recently noted on Xray, does not have any problems   Diverticulosis    Follicular lymphoma    2016 Non Hodgkins B cell lymphoma; s/p chemo summer 2016.  Remission until R axillary recurrence 11/2021-->chemo   Hiatal hernia 07/2020   moderate size-noted on CT abd/pelv at [facility] hosp   HLD (hyperlipidemia)     06/2014   HTN (hypertension)    Hypothyroidism    IFG (impaired fasting glucose)    Lip cancer    Dr. Jearld Fenton excised this: invasive SCC--no sign of cancer at ENT f/u 10/2015   Lymphedema    R>>L   Microcytic anemia    transfused 3 U total in hosp 07/2014   Obesity (BMI 30-39.9)    Pancytopenia due to antineoplastic chemotherapy    Persistent atrial fibrillation     With RVR; elec cardioversion 11/30/14.  On Amio since 10/2014; Dr. Kriste Basque following her from pulm standpoint regarding this med.   Positive occult stool blood test 08/08/2014   Endoscopies ok 08/2014   Primary osteoarthritis of both knees 04/2016   Dr. Darrelyn Hillock: bone on bone--to get euflexxa to both knees.   Protein calorie malnutrition 2022   Pulmonary hypertension due to left heart disease    RLCP 05/19/2019: EF 35-40%. D1 50%, RI 70%, dLM-LAD 30%.  mPAP 43 mmHg - (62/19 mmHg), PCWP  28 mmHg (V wave 41 mmHg) LVEDP 34 mmHg   Pulmonary metastases 07/24/2014   S/p left hip fracture 05/2020   ORIF   Venous stasis ulcer    Right leg   Vitamin B 12 deficiency    Vitamin D deficiency    dose increased to 50 K U tab TWICE weekly 04/2017   Past Surgical History:  Procedure Laterality Date   ABDOMINAL HYSTERECTOMY  1978   BONE MARROW BIOPSY  07/24/2014   BREAST BIOPSY     CARDIOVERSION N/A 10/16/2014   Procedure: CARDIOVERSION;  Surgeon: Lewayne Bunting, MD;  Location: [facility] ENDOSCOPY;  Service: Cardiovascular;  Laterality: N/A;   CARDIOVERSION N/A 11/30/2014   Procedure: CARDIOVERSION;  Surgeon: Laurey Morale, MD;  Location: [facility] ENDOSCOPY;  Service: Cardiovascular;  Laterality: N/A;   CATARACT EXTRACTION  2020   OU: Dr. Dione Booze   COLONOSCOPY  09/23/2014  small hemorrhoids, otherwise normal (performed for IDA and heme+ stool)   COLONOSCOPY     FRACTURE SURGERY     HIP FRACTURE SURGERY     May 2022   INTRAMEDULLARY (IM) NAIL INTERTROCHANTERIC Left 05/25/2020   Procedure: INTRAMEDULLARY (IM) NAIL INTERTROCHANTRIC;  Surgeon: Terance Hart, MD;  Location: [facility] OR;  Service: Orthopedics;  Laterality: Left;   MASS EXCISION Left 07/15/2015   Left lower lip mass--invasive SCC w/negative margins.  Procedure: EXCISION MASS;  Surgeon: Suzanna Obey, MD;  Location: [facility] OR;  Service: ENT;  Laterality: Left;  Wedge excision left lower lip mass   NM MYOVIEW LTD  10/29/2014   medium size mild surgery defect  in the mid anterior and apical anterior location suggestive of breast attenuation. No reversibility. LOW RISK   PFTs  02/2015   Restriction with diffusion defect: cardiologist referred her to pulm to help interpret PFTs and decide whether she has amiodarone toxicity   RIGHT/LEFT HEART CATH AND CORONARY ANGIOGRAPHY N/A 05/19/2019   Procedure: RIGHT/LEFT HEART CATH AND CORONARY ANGIOGRAPHY;  Surgeon: Lennette Bihari, MD;  Location: [facility] INVASIVE CV LAB;; EF 35-40%. D1 50%, RI 70%, dLM-LAD 30%.  mPAP 43 mmHg - (62/19 mmHg), PCWP  28 mmHg (V wave 41 mmHg) LVEDP 34 mmHg   TEE WITHOUT CARDIOVERSION N/A 10/16/2014   Procedure: TRANSESOPHAGEAL ECHOCARDIOGRAM (TEE);  Surgeon: Lewayne Bunting, MD;  Location: [facility] ENDOSCOPY;  Service: Cardiovascular;  Laterality: N/A;   TRANSTHORACIC ECHOCARDIOGRAM  10/09/2019   EF 55 to 60%.  GRII DD.  No R WMA.  Normal PAP and RAP.  Severe LA dilation.  Mild RA dilation.  Moderate MR (likely related to annular dilation in the setting of severe LAE.  Mild aortic valve sclerosis-no stenosis.   TRANSTHORACIC ECHOCARDIOGRAM  05/15/2019   Acute CHF exacerbation:  EF 35-40%.  Moderately reduced function.  (Previous EF was 55 to 60%) global HK.  GRII DD.  Mild LA dilation.  Normal RV size.   TRANSTHORACIC ECHOCARDIOGRAM     02/2022 EF 55%, no regional WMA, MOD TO SEVERE MR   UPPER GI ENDOSCOPY  09/23/2014   small hiatus hernia, nodules in stomach biopsied (chronic active erosive atrophic gastritis with intestinal metaplasia--no dysplasia or malignancy) otherwise normal    Allergies  Allergies  Allergen Reactions   Aspirin Itching   Calcium Alginate Other (See Comments)    Excessive burning   Morphine     Other reaction(s): Hallucinations   Iodinated Contrast Media Rash and Other (See Comments)    CT Contrast     Labs/Other Studies Reviewed    The following studies were reviewed today: R/LHC 2021:  1st Diag lesion is 50% stenosed. Dist LM to Ost LAD lesion is 30%  stenosed. Ramus lesion is 70% stenosed.   Moderate right heart pressure elevation with moderately severe PA systolic pressure elevation at 62 mm with mean pressure at 43 mm.  Pulmonary venous hypertension secondary to left-sided heart failure.   Prominent V wave up to 40 mm on PW tracing.   Two-vessel coronary obstructive disease with 30% ostial LAD stenosis and 50% ostial first diagonal stenosis; 70% stenosis in the proximal ramus intermediate vessel; normal dominant left circumflex coronary artery; normal nondominant RCA.   RECOMMENDATION:  Guideline directed medical therapy for reduced LV function with echo documentation EF at 35 to 40%.  We will add amlodipine with CAD.  Resume anticoagulation for PAF in a.m.   Echo 09/2019: IMPRESSIONS     1. Left ventricular ejection  fraction, by estimation, is 55 to 60%. The  left ventricle has normal function. The left ventricle has no regional  wall motion abnormalities. Left ventricular diastolic parameters are  consistent with Grade II diastolic  dysfunction (pseudonormalization).   2. Right ventricular systolic function is normal. The right ventricular  size is normal. There is normal pulmonary artery systolic pressure. The  estimated right ventricular systolic pressure is 21.7 mmHg.   3. Left atrial size was severely dilated.   4. Right atrial size was mildly dilated.   5. The mitral valve is normal in structure. Moderate mitral valve  regurgitation, possibly due to annular dilation in the setting of severe  LAE. No evidence of mitral stenosis.   6. The aortic valve is tricuspid. Aortic valve regurgitation is mild.  Mild aortic valve sclerosis is present, with no evidence of aortic valve  stenosis.   7. The inferior vena cava is normal in size with greater than 50%  respiratory variability, suggesting right atrial pressure of 3 mmHg.   Echo 02/2022: IMPRESSIONS     1. Left ventricular ejection fraction, by estimation, is 50 to 55%.  The  left ventricle has low normal function. The left ventricle has no regional  wall motion abnormalities. Left ventricular diastolic function could not  be evaluated.   2. Right ventricular systolic function is normal. The right ventricular  size is mildly enlarged. There is normal pulmonary artery systolic  pressure.   3. Left atrial size was severely dilated.   4. The mitral valve is abnormal. Moderate to severe mitral valve  regurgitation. Moderate mitral annular calcification.   5. The aortic valve was not well visualized. Aortic valve regurgitation  is mild.   6. The inferior vena cava is dilated in size with >50% respiratory  variability, suggesting right atrial pressure of 8 mmHg.   Comparison(s): Prior images reviewed side by side. Atrial functional  mitral regurgitation has increased from prior.   Recent Labs: 02/01/2022: TSH 9.78 05/02/2022: ALT 12; BUN 22; Creatinine 0.96; Hemoglobin 11.9; Platelet Count 104; Potassium 4.4; Sodium 143  Recent Lipid Panel    Component Value Date/Time   CHOL 176 02/23/2020 0928   CHOL 176 05/29/2019 1031   TRIG 158.0 (H) 02/23/2020 0928   HDL 71.00 02/23/2020 0928   HDL 57 05/29/2019 1031   CHOLHDL 2 02/23/2020 0928   VLDL 31.6 02/23/2020 0928   LDLCALC 74 02/23/2020 0928   LDLCALC 86 05/29/2019 1031   LDLDIRECT 106.0 05/17/2018 0900    History of Present Illness    89 year old female with the above past medical history including paroxysmal atrial fibrillation, mild nonobstructive CAD, LBBB, chronic diastolic heart failure, mitral valve regurgitation, hypertension, COPD on nightly O2, and non-Hodgkin's lymphoma.  She has a history of paroxysmal atrial fibrillation on amiodarone and Eliquis.  She has follow-up with A-fib clinic.  R/LHC in 2021 showed mild nonobstructive CAD, moderately elevated PASP.  She was last seen in the office on 02/20/2022 and was stable from a cardiac standpoint.  She was noted to have overall poor functional  status but appears generally euvolemic and well compensated.  Repeat echocardiogram in 02/2022 50 to 55%, normal LV function, no RWMA, indeterminate diastolic parameters, normal RV systolic function normal moderate to severe mitral valve regurgitation, moderate MAC, mild aortic valve regurgitation, atrial functional mitral regurgitation increased from prior echo.  Close follow-up was advised to evaluate for worsening symptoms secondary to MR possible need to document heart team for consideration of MitraClip.  She presents  today for follow-up accompanied by her daughter.  Since her last visit she has done well from a cardiac standpoint. She has nonpitting bilateral lower extremity edema, chronic discoloration to her lower extremities in the setting of venous insufficiency, lymphedema.  She has not been using her lymphedema pumps recently as she had recent cellulitis, but plans to resume therapy.  She has stable mild dyspnea, stable orthopnea.  She denies any chest pain, palpitations, dizziness, presyncope, or syncope.  Overall, she reports feeling well.    Home Medications    Current Outpatient Medications  Medication Sig Dispense Refill   amiodarone (PACERONE) 200 MG tablet Take 0.5 tablets (100 mg total) by mouth daily. 30 tablet 6   apixaban (ELIQUIS) 5 MG TABS tablet Take 1 tablet (5 mg total) by mouth 2 (two) times daily. 56 tablet 0   cyanocobalamin (,VITAMIN B-12,) 1000 MCG/ML injection INJECT 1 ML INTO THE SKIN EVERY 30 DAYS. 3 mL 3   diclofenac Sodium (VOLTAREN) 1 % GEL APPLY TOPICALLY 4 TIMES DAILY TO JOINTS THAT ARE IN PAIN. 300 g 1   docusate sodium (COLACE) 100 MG capsule Take 100 mg by mouth every other day.     ENTRESTO 24-26 MG TAKE 1 TABLET BY MOUTH TWICE A DAY 60 tablet 11   fish oil-omega-3 fatty acids 1000 MG capsule Take 1 g by mouth daily.     fluticasone (CUTIVATE) 0.05 % cream Apply to affected area of lower legs twice daily as needed. 180 g 3   furosemide (LASIX) 40 MG tablet  Taking 1 tablet by mouth daily and 1/2 tablet in the afternoon     HYDROcodone-acetaminophen (NORCO/VICODIN) 5-325 MG tablet 1-2 tabs po qid prn pain 180 tablet 0   hydrOXYzine (VISTARIL) 25 MG capsule TAKE 1 TO 2 CAPSULES BY MOUTH AT BEDTIME FOR SLEEP 180 capsule 1   levothyroxine (SYNTHROID) 112 MCG tablet TAKE 1 TABLET BY MOUTH EVERY DAY 90 tablet 1   omeprazole (PRILOSEC) 20 MG capsule Take 1 capsule (20 mg total) by mouth daily. 90 capsule 3   OXYGEN Inhale into the lungs. 1Lt at bedtime  as needed     Potassium Chloride ER 20 MEQ TBCR Take 1 tablet by mouth daily. 90 tablet 3   prochlorperazine (COMPAZINE) 5 MG tablet Take 1 tablet (5 mg total) by mouth every 6 (six) hours as needed. 60 tablet 1   rosuvastatin (CRESTOR) 20 MG tablet TAKE 1 TABLET BY MOUTH EVERY DAY 90 tablet 3   Vitamin D, Ergocalciferol, (DRISDOL) 1.25 MG (50000 UNIT) CAPS capsule TAKE 1 CAPSULE (50,000 UNITS TOTAL) BY MOUTH TWO TIMES A WEEK 24 capsule 1   No current facility-administered medications for this visit.     Review of Systems    She denies chest pain, palpitations, pnd, n, v, dizziness, syncope, weight gain, or early satiety. All other systems reviewed and are otherwise negative except as noted above.   Physical Exam    VS:  BP (!) 144/57   Pulse 82   Ht  (1.676 m)   SpO2 93%   BMI 32.12 kg/m  GEN: Well nourished, well developed, in no acute distress. HEENT: normal. Neck: Supple, no JVD, carotid bruits, or masses. Cardiac: RRR, no murmurs, rubs, or gallops. No clubbing, cyanosis, edema.  Radials/DP/PT 2+ and equal bilaterally.  Respiratory:  Respirations regular and unlabored, clear to auscultation bilaterally. GI: Soft, nontender, nondistended, BS + x 4. MS: no deformity or atrophy. Skin: warm and dry, no rash. Neuro:  Strength  and sensation are intact. Psych: Normal affect.  Accessory Clinical Findings    ECG personally reviewed by me today - No EKG in office today.   Lab Results   Component Value Date   WBC 2.7 (L) 05/02/2022   HGB 11.9 (L) 05/02/2022   HCT 37.3 05/02/2022   MCV 94.0 05/02/2022   PLT 104 (L) 05/02/2022   Lab Results  Component Value Date   CREATININE 0.96 05/02/2022   BUN 22 05/02/2022   NA 143 05/02/2022   K 4.4 05/02/2022   CL 104 05/02/2022   CO2 31 05/02/2022   Lab Results  Component Value Date   ALT 12 05/02/2022   AST 20 05/02/2022   ALKPHOS 49 05/02/2022   BILITOT 0.8 05/02/2022   Lab Results  Component Value Date   CHOL 176 02/23/2020   HDL 71.00 02/23/2020   LDLCALC 74 02/23/2020   LDLDIRECT 106.0 05/17/2018   TRIG 158.0 (H) 02/23/2020   CHOLHDL 2 02/23/2020    Lab Results  Component Value Date   HGBA1C 5.5 02/23/2020    Assessment & Plan   1. Mitral valve regurgitation: Echo in 02/2022 50 to 55%, normal LV function, no RWMA, indeterminate diastolic parameters, normal RV systolic function normal moderate to severe mitral valve regurgitation, moderate MAC, mild aortic valve regurgitation. Functional mitral regurgitation increased from prior echo.  She has stable nonpitting bilateral lower extremity edema, stable mild dyspnea, orthopnea.  She denies any worsening symptoms.  She is not interested in surgical intervention at this time. Continue to monitor symptoms. Continue Lasix.   2. Paroxysmal atrial fibrillation: Maintaining sinus rhythm on exam.  Continue amiodarone, Eliquis.  3. Mild-moderate CAD: R/LHC in 2021 showed mild nonobstructive CAD, moderately elevated PASP.  She has stable chronic dyspnea, denies any other symptoms concerning for angina.  No indication for ischemic evaluation at this time.  Continue Entresto, Crestor.  No ASA in the setting of chronic DOAC therapy.  4. Chronic diastolic heart failure: Most recent echo as above.  Euvolemic and well compensated on exam.  Continue Entresto, Lasix.  5. Hypertension: She has a history of labile BP.  BP mildly elevated in office today, overall well-controlled.  Continue current antihypertensive regimen.   6. COPD: On chronic home O2.  7. Non-Hodgkin's lymphoma: S/p chemo. Following with oncology.   8. Disposition: Follow-up as scheduled with Dr. Herbie Baltimore in 07/2022.  Joylene Grapes, NP 05/11/2022, 4:11 PM

## 2022-05-11 NOTE — Patient Instructions (Signed)
Medication Instructions:  Your physician recommends that you continue on your current medications as directed. Please refer to the Current Medication list given to you today.  *If you need a refill on your cardiac medications before your next appointment, please call your pharmacy*   Lab Work: NONE ordered at this time of appointment   If you have labs (blood work) drawn today and your tests are completely normal, you will receive your results only by: MyChart Message (if you have MyChart) OR A paper copy in the mail If you have any lab test that is abnormal or we need to change your treatment, we will call you to review the results.   Testing/Procedures: NONE ordered at this time of appointment     Follow-Up: At [facility], you and your health needs are our priority.  As part of our continuing mission to provide you with exceptional heart care, we have created designated Provider Care Teams.  These Care Teams include your primary Cardiologist (physician) and Advanced Practice Providers (APPs -  Physician Assistants and Nurse Practitioners) who all work together to provide you with the care you need, when you need it.  We recommend signing up for the patient portal called "MyChart".  Sign up information is provided on this After Visit Summary.  MyChart is used to connect with patients for Virtual Visits (Telemedicine).  Patients are able to view lab/test results, encounter notes, upcoming appointments, etc.  Non-urgent messages can be sent to your provider as well.   To learn more about what you can do with MyChart, go to [email or web address].    Your next appointment:    Keep follow up   Provider:   Bryan Lemma, MD     Other Instructions

## 2022-05-15 NOTE — Patient Instructions (Signed)

## 2022-05-17 ENCOUNTER — Encounter: Payer: Self-pay | Admitting: Family Medicine

## 2022-05-17 ENCOUNTER — Ambulatory Visit (INDEPENDENT_AMBULATORY_CARE_PROVIDER_SITE_OTHER): Payer: PPO | Admitting: Family Medicine

## 2022-05-17 VITALS — BP 142/77 | HR 72 | Temp 98.3°F | Ht 66.0 in | Wt 197.0 lb

## 2022-05-17 DIAGNOSIS — R600 Localized edema: Secondary | ICD-10-CM

## 2022-05-17 DIAGNOSIS — M545 Low back pain, unspecified: Secondary | ICD-10-CM

## 2022-05-17 DIAGNOSIS — C8514 Unspecified B-cell lymphoma, lymph nodes of axilla and upper limb: Secondary | ICD-10-CM

## 2022-05-17 DIAGNOSIS — E039 Hypothyroidism, unspecified: Secondary | ICD-10-CM | POA: Diagnosis not present

## 2022-05-17 DIAGNOSIS — G894 Chronic pain syndrome: Secondary | ICD-10-CM | POA: Diagnosis not present

## 2022-05-17 DIAGNOSIS — G8929 Other chronic pain: Secondary | ICD-10-CM

## 2022-05-17 LAB — TSH: TSH: 2.82 u[IU]/mL (ref 0.35–5.50)

## 2022-05-17 MED ORDER — HYDROCODONE-ACETAMINOPHEN 5-325 MG PO TABS
ORAL_TABLET | ORAL | 0 refills | Status: DC
Start: 2022-05-17 — End: 2022-06-14

## 2022-05-17 MED ORDER — FUROSEMIDE 40 MG PO TABS: ORAL_TABLET | ORAL | 3 refills | Status: DC

## 2022-05-17 NOTE — Progress Notes (Signed)
OFFICE VISIT  05/17/2022  CC:  Chief Complaint  Patient presents with   Medical Management of Chronic Issues    Pt is fasting    Patient is a 89 y.o. female who presents accompanied by her daughter for 70-month follow-up chronic pain syndrome, chronic bilateral lower extremity edema, and hypothyroidism. A/P as of last visit: "#1 chronic pain syndrome: Chronic low back pain and bilateral lower extremity pain due to venous insufficiency edema. She takes 2 Vicodin at bedtime and a total of 4 tabs of Vicodin in the daytime. Will keep her on this dosing, #180/month.   #2 bilateral lower extremity edema. Right greater than left chronically. She is currently doing well on Lasix 40 mg in the morning and 20 mg in the afternoon. She gets electrolytes and creatinine monitored with her chemo treatments, planned for next week.   3.  Chronic renal insufficiency stage III. She avoids NSAIDs. Electrolytes and creatinine monitoring as per #2 above.   #4  Hypothyroidism. She is on amiodarone therapy so we are monitoring TSH closely. She takes 112 mcg levothyroxine daily. TSH today.   #5 Recurrence of high-grade B cell non-Hodgkin's lymphoma in the right axillary lymph nodes NOV 2023.  She was started on chemotherapy with Cytoxan/etoposide/vincristine, prednisone, and rituxan. Has done 3 treatments.  Fourth treatment planned for next week, at which time she will get labs and see the oncologist."  INTERIM HX: TSH a little elevated a few months ago.  I increased her dosing to 112 mcg tab daily except on 2 days a week take 1-1/2 tabs.  Last month she finished out a course of systemic therapy for treatment of non-Hodgkin's lymphoma.  In clinical remission now. Most recent hematology visit 05/02/22: CBC showed mild chronic pancytopenia and complete metabolic panel was normal (sCr 0.96, GFR 57).  Cardiology follow-up visit 05/11/2022, no changes made.  Bilateral leg pain: Still constant but responds  somewhat to Vicodin.  No side effects. No significant legS swelling lately at all. PMP AWARE reviewed today: most recent rx for Vicodin 5/325 was filled 05/10/2022, # 180, rx by me. No red flags.  ROS as above, plus--> chronic fatigue.  No fevers, no CP, no SOB, no wheezing, no cough, no dizziness, no HAs, no rashes, no melena/hematochezia.  No polyuria or polydipsia.  No myalgias or arthralgias.  No focal weakness, paresthesias, or tremors.  No acute vision or hearing abnormalities.  No dysuria or unusual/new urinary urgency or frequency.  No recent changes in lower legs. No n/v/d or abd pain.  No palpitations.    Past Medical History:  Diagnosis Date   Anemia of chronic disease    Anticoagulation adequate, Eliquis 10/16/2014   Anxiety    clonaz helps this AND her breathing   Cholelithiasis without cholecystitis 07/2020   noted on CT abd/pelv at [facility]   Chronic combined systolic and diastolic congestive heart failure 10/16/2014   Complicated by atrial fibrillation; Echo 07/23/14: mild LVH, EF 50-55%, Gr 2 DD.;  Echo May 15, 2019: EF 35 to 40%, GRII DD.  (In setting of CHF exacerbation, class III)-> baseline class II symptoms. 09/2019 EF 55-60%, nl wall motion and LV fxn, grd II DD, mod MR.   Chronic renal insufficiency, stage III (moderate)    GFR @ 50   COPD (chronic obstructive pulmonary disease)    recently noted on Xray, does not have any problems   Diverticulosis    Follicular lymphoma    2016 Non Hodgkins B cell lymphoma; s/p  chemo summer 2016.  Remission until R axillary recurrence 11/2021-->chemo   Hiatal hernia 07/2020   moderate size-noted on CT abd/pelv at [facility]   HLD (hyperlipidemia)     06/2014   HTN (hypertension)    Hypothyroidism    IFG (impaired fasting glucose)    Lip cancer    Dr. Jearld Fenton excised this: invasive SCC--no sign of cancer at ENT f/u 10/2015   Lymphedema    R>>L   Microcytic anemia    transfused 3 U total in hosp 07/2014    Obesity (BMI 30-39.9)    Pancytopenia due to antineoplastic chemotherapy    Persistent atrial fibrillation    With RVR; elec cardioversion 11/30/14.  On Amio since 10/2014; Dr. Kriste Basque following her from pulm standpoint regarding this med.   Positive occult stool blood test 08/08/2014   Endoscopies ok 08/2014   Primary osteoarthritis of both knees 04/2016   Dr. Darrelyn Hillock: bone on bone--to get euflexxa to both knees.   Protein calorie malnutrition 2022   Pulmonary hypertension due to left heart disease    RLCP 05/19/2019: EF 35-40%. D1 50%, RI 70%, dLM-LAD 30%.  mPAP 43 mmHg - (62/19 mmHg), PCWP  28 mmHg (V wave 41 mmHg) LVEDP 34 mmHg   Pulmonary metastases 07/24/2014   S/p left hip fracture 05/2020   ORIF   Venous stasis ulcer    Right leg   Vitamin B 12 deficiency    Vitamin D deficiency    dose increased to 50 K U tab TWICE weekly 04/2017    Past Surgical History:  Procedure Laterality Date   ABDOMINAL HYSTERECTOMY  1978   BONE MARROW BIOPSY  07/24/2014   BREAST BIOPSY     CARDIOVERSION N/A 10/16/2014   Procedure: CARDIOVERSION;  Surgeon: Lewayne Bunting, MD;  Location: [facility] ENDOSCOPY;  Service: Cardiovascular;  Laterality: N/A;   CARDIOVERSION N/A 11/30/2014   Procedure: CARDIOVERSION;  Surgeon: Laurey Morale, MD;  Location: [facility] ENDOSCOPY;  Service: Cardiovascular;  Laterality: N/A;   CATARACT EXTRACTION  2020   OU: Dr. Dione Booze   COLONOSCOPY  09/23/2014   small hemorrhoids, otherwise normal (performed for IDA and heme+ stool)   COLONOSCOPY     FRACTURE SURGERY     HIP FRACTURE SURGERY     May 2022   INTRAMEDULLARY (IM) NAIL INTERTROCHANTERIC Left 05/25/2020   Procedure: INTRAMEDULLARY (IM) NAIL INTERTROCHANTRIC;  Surgeon: Terance Hart, MD;  Location: [facility] OR;  Service: Orthopedics;  Laterality: Left;   MASS EXCISION Left 07/15/2015   Left lower lip mass--invasive SCC w/negative margins.  Procedure: EXCISION MASS;  Surgeon: Suzanna Obey, MD;  Location: [facility] OR;  Service: ENT;   Laterality: Left;  Wedge excision left lower lip mass   NM MYOVIEW LTD  10/29/2014   medium size mild surgery defect in the mid anterior and apical anterior location suggestive of breast attenuation. No reversibility. LOW RISK   PFTs  02/2015   Restriction with diffusion defect: cardiologist referred her to pulm to help interpret PFTs and decide whether she has amiodarone toxicity   RIGHT/LEFT HEART CATH AND CORONARY ANGIOGRAPHY N/A 05/19/2019   Procedure: RIGHT/LEFT HEART CATH AND CORONARY ANGIOGRAPHY;  Surgeon: Lennette Bihari, MD;  Location: [facility] INVASIVE CV LAB;; EF 35-40%. D1 50%, RI 70%, dLM-LAD 30%.  mPAP 43 mmHg - (62/19 mmHg), PCWP  28 mmHg (V wave 41 mmHg) LVEDP 34 mmHg   TEE WITHOUT CARDIOVERSION N/A 10/16/2014   Procedure: TRANSESOPHAGEAL ECHOCARDIOGRAM (TEE);  Surgeon: Lewayne Bunting, MD;  Location: [facility] ENDOSCOPY;  Service: Cardiovascular;  Laterality: N/A;   TRANSTHORACIC ECHOCARDIOGRAM  10/09/2019   EF 55 to 60%.  GRII DD.  No R WMA.  Normal PAP and RAP.  Severe LA dilation.  Mild RA dilation.  Moderate MR (likely related to annular dilation in the setting of severe LAE.  Mild aortic valve sclerosis-no stenosis.   TRANSTHORACIC ECHOCARDIOGRAM  05/15/2019   Acute CHF exacerbation:  EF 35-40%.  Moderately reduced function.  (Previous EF was 55 to 60%) global HK.  GRII DD.  Mild LA dilation.  Normal RV size.   TRANSTHORACIC ECHOCARDIOGRAM     02/2022 EF 55%, no regional WMA, MOD TO SEVERE MR   UPPER GI ENDOSCOPY  09/23/2014   small hiatus hernia, nodules in stomach biopsied (chronic active erosive atrophic gastritis with intestinal metaplasia--no dysplasia or malignancy) otherwise normal    Outpatient Medications Prior to Visit  Medication Sig Dispense Refill   amiodarone (PACERONE) 200 MG tablet Take 0.5 tablets (100 mg total) by mouth daily. 30 tablet 6   apixaban (ELIQUIS) 5 MG TABS tablet Take 1 tablet (5 mg total) by mouth 2 (two) times daily. 56 tablet 0   cyanocobalamin  (,VITAMIN B-12,) 1000 MCG/ML injection INJECT 1 ML INTO THE SKIN EVERY 30 DAYS. 3 mL 3   diclofenac Sodium (VOLTAREN) 1 % GEL APPLY TOPICALLY 4 TIMES DAILY TO JOINTS THAT ARE IN PAIN. 300 g 1   docusate sodium (COLACE) 100 MG capsule Take 100 mg by mouth every other day.     ENTRESTO 24-26 MG TAKE 1 TABLET BY MOUTH TWICE A DAY 60 tablet 11   fish oil-omega-3 fatty acids 1000 MG capsule Take 1 g by mouth daily.     fluticasone (CUTIVATE) 0.05 % cream Apply to affected area of lower legs twice daily as needed. 180 g 3   hydrOXYzine (VISTARIL) 25 MG capsule TAKE 1 TO 2 CAPSULES BY MOUTH AT BEDTIME FOR SLEEP 180 capsule 1   levothyroxine (SYNTHROID) 112 MCG tablet TAKE 1 TABLET BY MOUTH EVERY DAY 90 tablet 1   omeprazole (PRILOSEC) 20 MG capsule Take 1 capsule (20 mg total) by mouth daily. 90 capsule 3   OXYGEN Inhale into the lungs. 1Lt at bedtime  as needed     Potassium Chloride ER 20 MEQ TBCR Take 1 tablet by mouth daily. 90 tablet 3   prochlorperazine (COMPAZINE) 5 MG tablet Take 1 tablet (5 mg total) by mouth every 6 (six) hours as needed. 60 tablet 1   rosuvastatin (CRESTOR) 20 MG tablet TAKE 1 TABLET BY MOUTH EVERY DAY 90 tablet 3   Vitamin D, Ergocalciferol, (DRISDOL) 1.25 MG (50000 UNIT) CAPS capsule TAKE 1 CAPSULE (50,000 UNITS TOTAL) BY MOUTH TWO TIMES A WEEK 24 capsule 1   furosemide (LASIX) 40 MG tablet Taking 1 tablet by mouth daily and 1/2 tablet in the afternoon     HYDROcodone-acetaminophen (NORCO/VICODIN) 5-325 MG tablet 1-2 tabs po qid prn pain 180 tablet 0   No facility-administered medications prior to visit.    Allergies  Allergen Reactions   Aspirin Itching   Calcium Alginate Other (See Comments)    Excessive burning   Morphine     Other reaction(s): Hallucinations   Iodinated Contrast Media Rash and Other (See Comments)    CT Contrast    Review of Systems As per HPI  PE:    05/17/2022   10:58 AM 05/11/2022   10:36 AM 05/02/2022   11:11 AM  Vitals with BMI  Height 5\' 6"  5\' 6"  5\' 6"   Weight 197 lbs  199 lbs  BMI 31.81  32.13  Systolic 142 144 756  Diastolic 77 57 77  Pulse 72 82 91  02 sat 93%  Physical Exam  General: Alert, tired appearing but does not appear ill.  Sitting in wheelchair. Affect is pleasant, lucid thought and speech.  LABS:  Last CBC Lab Results  Component Value Date   WBC 2.7 (L) 05/02/2022   HGB 11.9 (L) 05/02/2022   HCT 37.3 05/02/2022   MCV 94.0 05/02/2022   MCH 30.0 05/02/2022   RDW 14.7 05/02/2022   PLT 104 (L) 05/02/2022   Lab Results  Component Value Date   IRON 93 06/27/2021   TIBC 337 06/27/2021   FERRITIN 128 06/27/2021   Last metabolic panel Lab Results  Component Value Date   GLUCOSE 124 (H) 05/02/2022   NA 143 05/02/2022   K 4.4 05/02/2022   CL 104 05/02/2022   CO2 31 05/02/2022   BUN 22 05/02/2022   CREATININE 0.96 05/02/2022   GFRNONAA 57 (L) 05/02/2022   CALCIUM 9.6 05/02/2022   PHOS 3.1 12/13/2021   PROT 6.1 (L) 05/02/2022   ALBUMIN 4.6 05/02/2022   LABGLOB 3.2 07/23/2014   AGRATIO 0.8 07/23/2014   BILITOT 0.8 05/02/2022   ALKPHOS 49 05/02/2022   AST 20 05/02/2022   ALT 12 05/02/2022   ANIONGAP 8 05/02/2022   Last hemoglobin A1c Lab Results  Component Value Date   HGBA1C 5.5 02/23/2020   Last thyroid functions Lab Results  Component Value Date   TSH 9.78 (H) 02/01/2022   Last vitamin B12 and Folate Lab Results  Component Value Date   VITAMINB12 637 09/30/2019   FOLATE >23.7 05/10/2015   IMPRESSION AND PLAN:  #1 chronic pain syndrome--chronic bilateral low back pain without sciatica. Stable. Continue Vicodin 5/325, 1-2 4 times daily as needed, number 180/month.  #2 bilateral lower extremity edema. Doing well lately.  She takes 40 mg Lasix every morning and 20 mg in the afternoon. Continue. Electrolytes were normal and GFR 59 on lab panel with oncology for 924.    #3 hypothyroidism. TSH a little elevated a few months ago.  I increased her dosing to 112  mcg tab daily except on 2 days a week take 1-1/2 tabs. TSH today.  #4 non-Hodgkin's lymphoma, recent recurrence requiring systemic treatment. She is in clinical remission now. Most recent hematology visit 05/02/22: CBC showed mild chronic pancytopenia and complete metabolic panel was normal (sCr 0.96, GFR 57).  MOST form completed today and scanned into chart.  An After Visit Summary was printed and given to the patient.  FOLLOW UP: Return in about 3 months (around 08/16/2022) for routine chronic illness f/u.  Signed:  Santiago Bumpers, MD           05/17/2022

## 2022-05-31 ENCOUNTER — Other Ambulatory Visit: Payer: Self-pay | Admitting: Cardiology

## 2022-05-31 DIAGNOSIS — I480 Paroxysmal atrial fibrillation: Secondary | ICD-10-CM

## 2022-05-31 NOTE — Telephone Encounter (Signed)
Eliquis 5mg  refill request received. Patient is 89 years old, weight-89.4kg, Crea-0.96 on 05/02/22, Diagnosis-Afib, and last seen by Bernadene Person on 05/11/22. Dose is appropriate based on dosing criteria. Will send in refill to requested pharmacy.

## 2022-06-13 ENCOUNTER — Inpatient Hospital Stay: Payer: PPO | Attending: Oncology

## 2022-06-13 ENCOUNTER — Inpatient Hospital Stay: Payer: PPO | Admitting: Oncology

## 2022-06-13 VITALS — BP 129/59 | HR 74 | Temp 98.1°F | Resp 18 | Ht 66.0 in | Wt 197.0 lb

## 2022-06-13 DIAGNOSIS — I509 Heart failure, unspecified: Secondary | ICD-10-CM | POA: Diagnosis not present

## 2022-06-13 DIAGNOSIS — Z8572 Personal history of non-Hodgkin lymphomas: Secondary | ICD-10-CM

## 2022-06-13 DIAGNOSIS — C8514 Unspecified B-cell lymphoma, lymph nodes of axilla and upper limb: Secondary | ICD-10-CM

## 2022-06-13 DIAGNOSIS — R110 Nausea: Secondary | ICD-10-CM | POA: Diagnosis not present

## 2022-06-13 DIAGNOSIS — Z9221 Personal history of antineoplastic chemotherapy: Secondary | ICD-10-CM | POA: Diagnosis not present

## 2022-06-13 DIAGNOSIS — D709 Neutropenia, unspecified: Secondary | ICD-10-CM | POA: Diagnosis not present

## 2022-06-13 LAB — CMP (CANCER CENTER ONLY)
ALT: 9 U/L (ref 0–44)
AST: 19 U/L (ref 15–41)
Albumin: 4.4 g/dL (ref 3.5–5.0)
Alkaline Phosphatase: 47 U/L (ref 38–126)
Anion gap: 9 (ref 5–15)
BUN: 21 mg/dL (ref 8–23)
CO2: 30 mmol/L (ref 22–32)
Calcium: 9.4 mg/dL (ref 8.9–10.3)
Chloride: 102 mmol/L (ref 98–111)
Creatinine: 0.97 mg/dL (ref 0.44–1.00)
GFR, Estimated: 57 mL/min — ABNORMAL LOW (ref >60)
Glucose, Bld: 119 mg/dL — ABNORMAL HIGH (ref 70–99)
Potassium: 4 mmol/L (ref 3.5–5.1)
Sodium: 141 mmol/L (ref 135–145)
Total Bilirubin: 0.7 mg/dL (ref 0.3–1.2)
Total Protein: 6.2 g/dL — ABNORMAL LOW (ref 6.5–8.1)

## 2022-06-13 LAB — CBC WITH DIFFERENTIAL (CANCER CENTER ONLY)
Abs Immature Granulocytes: 0.01 10*3/uL (ref 0.00–0.07)
Basophils Absolute: 0 10*3/uL (ref 0.0–0.1)
Basophils Relative: 0 %
Eosinophils Absolute: 0.1 10*3/uL (ref 0.0–0.5)
Eosinophils Relative: 2 %
HCT: 38.6 % (ref 36.0–46.0)
Hemoglobin: 12.8 g/dL (ref 12.0–15.0)
Immature Granulocytes: 0 %
Lymphocytes Relative: 24 %
Lymphs Abs: 0.6 10*3/uL — ABNORMAL LOW (ref 0.7–4.0)
MCH: 30.4 pg (ref 26.0–34.0)
MCHC: 33.2 g/dL (ref 30.0–36.0)
MCV: 91.7 fL (ref 80.0–100.0)
Monocytes Absolute: 0.3 10*3/uL (ref 0.1–1.0)
Monocytes Relative: 12 %
Neutro Abs: 1.5 10*3/uL — ABNORMAL LOW (ref 1.7–7.7)
Neutrophils Relative %: 62 %
Platelet Count: 101 10*3/uL — ABNORMAL LOW (ref 150–400)
RBC: 4.21 MIL/uL (ref 3.87–5.11)
RDW: 13.4 % (ref 11.5–15.5)
WBC Count: 2.5 10*3/uL — ABNORMAL LOW (ref 4.0–10.5)
nRBC: 0 % (ref 0.0–0.2)

## 2022-06-13 LAB — LACTATE DEHYDROGENASE: LDH: 388 U/L — ABNORMAL HIGH (ref 98–192)

## 2022-06-13 MED ORDER — PROCHLORPERAZINE MALEATE 5 MG PO TABS
5.0000 mg | ORAL_TABLET | Freq: Four times a day (QID) | ORAL | 2 refills | Status: DC | PRN
Start: 2022-06-13 — End: 2022-07-17

## 2022-06-13 NOTE — Progress Notes (Signed)
[facility] OFFICE PROGRESS NOTE   Diagnosis: Non-Hodgkin's lymphoma  INTERVAL HISTORY:   Amanda Sellers returns as scheduled.  The right breast remains normal.  No fever or night sweats.  Good appetite.  She reports nausea each morning.  The nausea is relieved with Compazine.  No vomiting.  No difficulty with bowel function.  She is followed by cardiology for CHF.  Objective:  Vital signs in last 24 hours:  Blood pressure (!) 129/59, pulse 74, temperature 98.1 F (36.7 C), temperature source Oral, resp. rate 18, height 5\' 6"  (1.676 m), weight 197 lb (89.4 kg), SpO2 98 %.    Lymphatics: No cervical, supraclavicular, axillary, or inguinal nodes Resp: Lungs clear bilaterally Cardio: Regular rate and rhythm GI: No hepatosplenomegaly, no mass, nontender Vascular: No leg edema Breast: Right breast without mass, edema, or erythema   Lab Results:  Lab Results  Component Value Date   WBC 2.5 (L) 06/13/2022   HGB 12.8 06/13/2022   HCT 38.6 06/13/2022   MCV 91.7 06/13/2022   PLT 101 (L) 06/13/2022   NEUTROABS 1.5 (L) 06/13/2022    CMP  Lab Results  Component Value Date   NA 143 05/02/2022   K 4.4 05/02/2022   CL 104 05/02/2022   CO2 31 05/02/2022   GLUCOSE 124 (H) 05/02/2022   BUN 22 05/02/2022   CREATININE 0.96 05/02/2022   CALCIUM 9.6 05/02/2022   PROT 6.1 (L) 05/02/2022   ALBUMIN 4.6 05/02/2022   AST 20 05/02/2022   ALT 12 05/02/2022   ALKPHOS 49 05/02/2022   BILITOT 0.8 05/02/2022   GFRNONAA 57 (L) 05/02/2022   GFRAA 50 (L) 05/29/2019    Lab Results  Component Value Date   CEA 0.8 07/23/2014     Medications: I have reviewed the patient's current medications.   Assessment/Plan: Non-Hodgkin's Lymphoma-bone marrow biopsy 07/24/2014 consistent with involvement by follicular B-cell lymphoma, CD20 positive   CTs of the chest 07/22/2014 and CT of the abdomen and pelvis on 07/23/2014-pulmonary nodules, hilar/mediastinal/supraclavicular/axillary  adenopathy, splenomegaly, abdominal adenopathy, bilateral adrenal nodules   Cycle 1 bendamustine/rituximab 08/04/2014 Cycle 2 bendamustine/Rituxan 09/01/2014 Cycle 3 bendamustine/Rituxan 09/30/2014 Cycle 4 bendamustine/rituximab 10/27/2014 Restaging CT scans 11/17/2014 with significant improvement in diffuse lymphadenopathy, lung nodules, and renal lesions 2. Severe microcytic anemia, status post transfusion with packed red blood cells 07/23/2014   Normal ferritin, low transferrin saturation, "scant" bone marrow iron stores, review of peripheral blood smear consistent with iron deficiency anemia   colonoscopy and upper endoscopy 09/23/2014 3. Exertional dyspnea /orthopnea-most likely secondary to congestive heart failure   4. History of B-12 deficiency   5. Rash over the trunk and extremities 08/18/2014. Appears consistent with a drug rash. Resolved 08/28/2014. Rash 11/18/2014, likely secondary to a contrast allergy  , diagnostic contrast will be listed as an allergy 6. Colonoscopy 06/03/2008. Medium sized internal hemorrhoids.   7. Admission with atrial flutter with a rapid ventricular response 10/15/2014-status post cardioversion 10/16/2014, maintained on     apixaban Repeat cardioversion 11/30/2014   8. Right olecranon skin lesion-likely a lipoma or cyst 9. 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  Large right axillary mass 11/24/2021 CT with numerous bulky matted appearing right axillary lymph nodes, largest discretely measurable node measuring 4.6 x 4.3 cm.  Severe edema of the right breast and right chest wall.  No lymphadenopathy or metastatic disease in the abdomen or pelvis.   Bilateral mammogram and ultrasound right breast 11/29/2021-suspicious mass possibly a replaced lymph node right axilla  measuring 4.3 cm.  At least 1 additional abnormal lymph node with cortical thickening identified in the right axilla.  Diffuse right breast skin thickening and increased  trabeculation.   Biopsy right axillary node 11/29/2021-high-grade B-cell lymphoma with proliferation rate approaching 100%,  29% of the cells kappa restricted CD10 positive B cells Cycle 1 CEOP-Rituxan 12/06/2021 Cycle 2 CEOP-Rituxan 12/27/2021, Udenyca Cycle 3 CEOP-Rituxan 01/18/2022, Udenyca Cycle 4 CEOP-Rituxan 02/08/2022, Udenyca Restaging chest CT 02/23/2022-right axilla is incompletely imaged but there appears to have been substantial decrease in size of bulky right axillary and subpectoral lymphadenopathy.  Matted soft tissue remains in this vicinity without discretely measurable lymph nodes.  No other enlarged mediastinal, hilar or axillary lymph nodes.  Interval resolution of previously seen soft tissue edema involving the right breast and chest wall. Cycle 5 CEOP-Rituxan 03/14/2022 Cycle 6 CEOP-Rituxan 04/04/2022   11.  Left lower extremity cellulitis 12/21/2021.  Improved since beginning doxycycline 12/19/2021.  14-day course planned.      Disposition: Amanda Sellers is in clinical remission from lymphoma.  The a.m. nausea may be related to heart disease.  She will continue as needed Compazine.  She will return for an office and lab visit in 3 months.  Mild neutropenia and lymphopenia are likely related to the recent course of systemic therapy.  She will call for a fever.  Thornton Papas, MD  06/13/2022  12:01 PM

## 2022-06-14 ENCOUNTER — Telehealth: Payer: Self-pay | Admitting: *Deleted

## 2022-06-14 ENCOUNTER — Other Ambulatory Visit: Payer: Self-pay

## 2022-06-14 DIAGNOSIS — C8514 Unspecified B-cell lymphoma, lymph nodes of axilla and upper limb: Secondary | ICD-10-CM

## 2022-06-14 NOTE — Telephone Encounter (Signed)
Made daughter aware of LDH elevation--could be benign or lymphoma. MD suggests repeat in 2-3 week and then can decide how to proceed. She agrees to this plan. Scheduling message sent.

## 2022-06-14 NOTE — Telephone Encounter (Signed)
-----   Message from Ladene Artist, MD sent at 06/13/2022  4:44 PM EDT ----- Please call patient, the LDH returned elevated, likely benign finding, could dictate progression of lymphoma, repeat LDH and CMP in 2-3 weeks

## 2022-06-14 NOTE — Telephone Encounter (Signed)
Requesting: Norco Contract: n/a UDS: n/a Last Visit: 05/17/22 Next Visit: 08/16/22 Last Refill: 05/17/22 (180,1)  Please Advise. Med pending

## 2022-06-14 NOTE — Telephone Encounter (Signed)
Patient prescribed medication by Dr. Milinda Cave at last visit 4/24, sent to pharmacy to be placed on hold until she needed it.  Patient transferred pharmacies because of insurance.  Patient needs new prescription sent to [facility].  Patient aware Dr. Milinda Cave out of office until next Tuesday 5/28.  Patient wanted to go ahead and submit request.  HYDROcodone-acetaminophen (NORCO/VICODIN) 5-325 MG tablet

## 2022-06-15 ENCOUNTER — Telehealth: Payer: Self-pay

## 2022-06-15 MED ORDER — HYDROCODONE-ACETAMINOPHEN 5-325 MG PO TABS: ORAL_TABLET | ORAL | 0 refills | Status: DC

## 2022-06-15 NOTE — Telephone Encounter (Signed)
Katie RpH from [facility] needs to speak to clinical assistant with Dr. Milinda Cave.  Please call Katie at [phone].

## 2022-06-15 NOTE — Telephone Encounter (Signed)
Returned call to [facility], they do not have the Hydrocodone in stock, will have to send to another pharmacy. She cancelled rx on her end.   LVM for pt's daughter, Amanda Sellers regarding medication. She will have to call and see if another pharmacy has this medication in stock for the 180 tabs.

## 2022-06-16 MED ORDER — HYDROCODONE-ACETAMINOPHEN 5-325 MG PO TABS: ORAL_TABLET | ORAL | 0 refills | Status: DC

## 2022-06-16 NOTE — Addendum Note (Signed)
Addended by: Jeoffrey Massed on: 06/16/2022 01:34 PM   Modules accepted: Orders

## 2022-06-16 NOTE — Telephone Encounter (Signed)
New rx sent

## 2022-06-16 NOTE — Telephone Encounter (Signed)
Spoke with pt's daughter, please resend to [facility].

## 2022-06-16 NOTE — Addendum Note (Signed)
Addended by: Emi Holes D on: 06/16/2022 10:16 AM   Modules accepted: Orders

## 2022-06-16 NOTE — Telephone Encounter (Signed)
Pt's daughter advised refill sent. 

## 2022-06-19 ENCOUNTER — Encounter: Payer: Self-pay | Admitting: Oncology

## 2022-06-23 ENCOUNTER — Encounter: Payer: Self-pay | Admitting: Oncology

## 2022-06-23 ENCOUNTER — Other Ambulatory Visit (HOSPITAL_BASED_OUTPATIENT_CLINIC_OR_DEPARTMENT_OTHER): Payer: Self-pay

## 2022-06-23 ENCOUNTER — Inpatient Hospital Stay: Payer: PPO

## 2022-06-23 ENCOUNTER — Encounter: Payer: Self-pay | Admitting: Nurse Practitioner

## 2022-06-23 ENCOUNTER — Inpatient Hospital Stay: Payer: PPO | Admitting: Nurse Practitioner

## 2022-06-23 VITALS — BP 142/62 | HR 105 | Temp 97.9°F | Resp 18 | Wt 196.8 lb

## 2022-06-23 DIAGNOSIS — C8200 Follicular lymphoma grade I, unspecified site: Secondary | ICD-10-CM | POA: Diagnosis not present

## 2022-06-23 DIAGNOSIS — Z8572 Personal history of non-Hodgkin lymphomas: Secondary | ICD-10-CM | POA: Diagnosis not present

## 2022-06-23 DIAGNOSIS — C8514 Unspecified B-cell lymphoma, lymph nodes of axilla and upper limb: Secondary | ICD-10-CM

## 2022-06-23 LAB — CBC WITH DIFFERENTIAL (CANCER CENTER ONLY)
Abs Immature Granulocytes: 0.02 10*3/uL (ref 0.00–0.07)
Basophils Absolute: 0 10*3/uL (ref 0.0–0.1)
Basophils Relative: 0 %
Eosinophils Absolute: 0 10*3/uL (ref 0.0–0.5)
Eosinophils Relative: 1 %
HCT: 41.2 % (ref 36.0–46.0)
Hemoglobin: 13.9 g/dL (ref 12.0–15.0)
Immature Granulocytes: 0 %
Lymphocytes Relative: 13 %
Lymphs Abs: 0.6 10*3/uL — ABNORMAL LOW (ref 0.7–4.0)
MCH: 30.6 pg (ref 26.0–34.0)
MCHC: 33.7 g/dL (ref 30.0–36.0)
MCV: 90.7 fL (ref 80.0–100.0)
Monocytes Absolute: 0.5 10*3/uL (ref 0.1–1.0)
Monocytes Relative: 10 %
Neutro Abs: 3.7 10*3/uL (ref 1.7–7.7)
Neutrophils Relative %: 76 %
Platelet Count: 134 10*3/uL — ABNORMAL LOW (ref 150–400)
RBC: 4.54 MIL/uL (ref 3.87–5.11)
RDW: 13.5 % (ref 11.5–15.5)
WBC Count: 4.8 10*3/uL (ref 4.0–10.5)
nRBC: 0 % (ref 0.0–0.2)

## 2022-06-23 LAB — CMP (CANCER CENTER ONLY)
ALT: 9 U/L (ref 0–44)
AST: 22 U/L (ref 15–41)
Albumin: 4.3 g/dL (ref 3.5–5.0)
Alkaline Phosphatase: 46 U/L (ref 38–126)
Anion gap: 11 (ref 5–15)
BUN: 37 mg/dL — ABNORMAL HIGH (ref 8–23)
CO2: 29 mmol/L (ref 22–32)
Calcium: 10.2 mg/dL (ref 8.9–10.3)
Chloride: 99 mmol/L (ref 98–111)
Creatinine: 1.32 mg/dL — ABNORMAL HIGH (ref 0.44–1.00)
GFR, Estimated: 39 mL/min — ABNORMAL LOW (ref >60)
Glucose, Bld: 135 mg/dL — ABNORMAL HIGH (ref 70–99)
Potassium: 4.6 mmol/L (ref 3.5–5.1)
Sodium: 139 mmol/L (ref 135–145)
Total Bilirubin: 0.6 mg/dL (ref 0.3–1.2)
Total Protein: 6.4 g/dL — ABNORMAL LOW (ref 6.5–8.1)

## 2022-06-23 LAB — LACTATE DEHYDROGENASE: LDH: 490 U/L — ABNORMAL HIGH (ref 98–192)

## 2022-06-23 MED ORDER — HYDROMORPHONE HCL 2 MG PO TABS
2.0000 mg | ORAL_TABLET | ORAL | 0 refills | Status: DC | PRN
Start: 2022-06-23 — End: 2022-06-30
  Filled 2022-06-23: qty 60, 10d supply, fill #0

## 2022-06-23 MED ORDER — PREDNISONE 20 MG PO TABS
60.0000 mg | ORAL_TABLET | Freq: Every day | ORAL | 0 refills | Status: DC
Start: 2022-06-23 — End: 2022-07-17

## 2022-06-23 MED ORDER — HYDROMORPHONE HCL 2 MG PO TABS: 2.0000 mg | ORAL_TABLET | ORAL | 0 refills | Status: DC | PRN

## 2022-06-23 NOTE — Progress Notes (Signed)
[facility] OFFICE PROGRESS NOTE   Diagnosis: Non-Hodgkin's lymphoma  INTERVAL HISTORY:   Amanda Sellers returns prior to scheduled follow-up.  She developed recurrent pain and swelling involving the right breast and arm over the past 6 to 7 days.  No fevers or sweats.  She is nauseated.  No vomiting.  Appetite is poor.  No diarrhea.  She is taking 2 Vicodin tablets at a time with partial short-term improvement in the pain.  She would like something stronger.  Objective:  Vital signs in last 24 hours:  Blood pressure (!) 142/62, pulse (!) 105, temperature 97.9 F (36.6 C), temperature source Tympanic, resp. rate 18, weight 196 lb 12.8 oz (89.3 kg), SpO2 100 %.    HEENT: No thrush or ulcers. Lymphatics: No palpable cervical, supraclavicular or axillary lymph nodes.  There is diffuse firm fullness in the right axilla, no discrete mass.  Full appearance at the right neck and chest. Resp: Lungs clear bilaterally. Cardio: Regular rate and rhythm. GI: Abdomen soft and nontender. Vascular: No leg edema.  Right arm is edematous with a prominent vein pattern. Breast: Right breast is edematous, warm. Neuro: Alert and oriented.   Lab Results:  Lab Results  Component Value Date   WBC 2.5 (L) 06/13/2022   HGB 12.8 06/13/2022   HCT 38.6 06/13/2022   MCV 91.7 06/13/2022   PLT 101 (L) 06/13/2022   NEUTROABS 1.5 (L) 06/13/2022    Imaging:  No results found.  Medications: I have reviewed the patient's current medications.  Assessment/Plan: Non-Hodgkin's Lymphoma-bone marrow biopsy 07/24/2014 consistent with involvement by follicular B-cell lymphoma, CD20 positive   CTs of the chest 07/22/2014 and CT of the abdomen and pelvis on 07/23/2014-pulmonary nodules, hilar/mediastinal/supraclavicular/axillary adenopathy, splenomegaly, abdominal adenopathy, bilateral adrenal nodules   Cycle 1 bendamustine/rituximab 08/04/2014 Cycle 2 bendamustine/Rituxan 09/01/2014 Cycle 3  bendamustine/Rituxan 09/30/2014 Cycle 4 bendamustine/rituximab 10/27/2014 Restaging CT scans 11/17/2014 with significant improvement in diffuse lymphadenopathy, lung nodules, and renal lesions 2. Severe microcytic anemia, status post transfusion with packed red blood cells 07/23/2014   Normal ferritin, low transferrin saturation, "scant" bone marrow iron stores, review of peripheral blood smear consistent with iron deficiency anemia   colonoscopy and upper endoscopy 09/23/2014 3. Exertional dyspnea /orthopnea-most likely secondary to congestive heart failure   4. History of B-12 deficiency   5. Rash over the trunk and extremities 08/18/2014. Appears consistent with a drug rash. Resolved 08/28/2014. Rash 11/18/2014, likely secondary to a contrast allergy  , diagnostic contrast will be listed as an allergy 6. Colonoscopy 06/03/2008. Medium sized internal hemorrhoids.   7. Admission with atrial flutter with a rapid ventricular response 10/15/2014-status post cardioversion 10/16/2014, maintained on     apixaban Repeat cardioversion 11/30/2014   8. Right olecranon skin lesion-likely a lipoma or cyst 9. 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  Large right axillary mass 11/24/2021 CT with numerous bulky matted appearing right axillary lymph nodes, largest discretely measurable node measuring 4.6 x 4.3 cm.  Severe edema of the right breast and right chest wall.  No lymphadenopathy or metastatic disease in the abdomen or pelvis.   Bilateral mammogram and ultrasound right breast 11/29/2021-suspicious mass possibly a replaced lymph node right axilla measuring 4.3 cm.  At least 1 additional abnormal lymph node with cortical thickening identified in the right axilla.  Diffuse right breast skin thickening and increased trabeculation.   Biopsy right axillary node 11/29/2021-high-grade B-cell lymphoma with proliferation rate approaching 100%,  29% of the cells kappa restricted  CD10  positive B cells Cycle 1 CEOP-Rituxan 12/06/2021 Cycle 2 CEOP-Rituxan 12/27/2021, Udenyca Cycle 3 CEOP-Rituxan 01/18/2022, Udenyca Cycle 4 CEOP-Rituxan 02/08/2022, Udenyca Restaging chest CT 02/23/2022-right axilla is incompletely imaged but there appears to have been substantial decrease in size of bulky right axillary and subpectoral lymphadenopathy.  Matted soft tissue remains in this vicinity without discretely measurable lymph nodes.  No other enlarged mediastinal, hilar or axillary lymph nodes.  Interval resolution of previously seen soft tissue edema involving the right breast and chest wall. Cycle 5 CEOP-Rituxan 03/14/2022 Cycle 6 CEOP-Rituxan 04/04/2022   11.  Left lower extremity cellulitis 12/21/2021.  Improved since beginning doxycycline 12/19/2021.  14-day course planned.  Disposition: [name] completed a 6th cycle of CEOP-Rituxan for treatment of high-grade non-Hodgkin's lymphoma 04/04/2022.  She presents today with recurrent pain and edema of the right arm/chest wall/breast.  She and her daughter understand this likely indicates progression of the lymphoma.  She is having considerable pain.  We discussed hospitalization.  She would like to remain outpatient.  She will begin prednisone 60 mg daily.  We are changing pain medication to hydromorphone 2 mg every 4 hours as needed.  We are referring her for an urgent PET scan.  If we are unable to arrange for a PET scan we will refer for urgent CTs.  She will return for follow-up 06/26/2022.  Patient seen with Dr. Truett Perna.  Lonna Cobb ANP/GNP-BC   06/23/2022  9:41 AM  This was a shared visit with Lonna Cobb.  [name] was interviewed and examined.  She has clinical evidence of progressive lymphoma.  The LDH is elevated.  She will be referred for a restaging PET and return for an office visit on 06/26/2022.  We started her on a palliative course of prednisone.  She will try hydromorphone for arm pain.  She will present to the emergency  room for increased pain or new symptoms.  We will consider salvage systemic options versus palliative radiation depending on her clinical status and the imaging studies.  I was present for greater than 50% of today's visit.  I performed medical stage making.  Mancel Bale, MD

## 2022-06-25 ENCOUNTER — Ambulatory Visit (HOSPITAL_BASED_OUTPATIENT_CLINIC_OR_DEPARTMENT_OTHER)
Admission: RE | Admit: 2022-06-25 | Discharge: 2022-06-25 | Disposition: A | Discharge status: Home / Self Care | Payer: PPO | Source: Ambulatory Visit | Attending: Nurse Practitioner | Admitting: Nurse Practitioner

## 2022-06-25 ENCOUNTER — Other Ambulatory Visit: Payer: Self-pay | Admitting: Nurse Practitioner

## 2022-06-25 ENCOUNTER — Ambulatory Visit (HOSPITAL_BASED_OUTPATIENT_CLINIC_OR_DEPARTMENT_OTHER): Payer: PPO

## 2022-06-25 DIAGNOSIS — C8514 Unspecified B-cell lymphoma, lymph nodes of axilla and upper limb: Secondary | ICD-10-CM | POA: Diagnosis not present

## 2022-06-25 DIAGNOSIS — R918 Other nonspecific abnormal finding of lung field: Secondary | ICD-10-CM | POA: Diagnosis not present

## 2022-06-25 DIAGNOSIS — C8200 Follicular lymphoma grade I, unspecified site: Secondary | ICD-10-CM

## 2022-06-25 DIAGNOSIS — K449 Diaphragmatic hernia without obstruction or gangrene: Secondary | ICD-10-CM | POA: Diagnosis not present

## 2022-06-25 DIAGNOSIS — C8590 Non-Hodgkin lymphoma, unspecified, unspecified site: Secondary | ICD-10-CM | POA: Diagnosis not present

## 2022-06-26 ENCOUNTER — Encounter: Payer: Self-pay | Admitting: Nurse Practitioner

## 2022-06-26 ENCOUNTER — Inpatient Hospital Stay: Payer: PPO | Attending: Oncology | Admitting: Nurse Practitioner

## 2022-06-26 ENCOUNTER — Inpatient Hospital Stay: Payer: PPO | Admitting: Nurse Practitioner

## 2022-06-26 ENCOUNTER — Encounter: Payer: Self-pay | Admitting: Oncology

## 2022-06-26 VITALS — BP 132/65 | HR 76 | Temp 98.1°F | Resp 18 | Ht 66.0 in | Wt 197.0 lb

## 2022-06-26 DIAGNOSIS — E860 Dehydration: Secondary | ICD-10-CM | POA: Insufficient documentation

## 2022-06-26 DIAGNOSIS — R197 Diarrhea, unspecified: Secondary | ICD-10-CM | POA: Insufficient documentation

## 2022-06-26 DIAGNOSIS — Z5112 Encounter for antineoplastic immunotherapy: Secondary | ICD-10-CM | POA: Diagnosis not present

## 2022-06-26 DIAGNOSIS — K5900 Constipation, unspecified: Secondary | ICD-10-CM | POA: Diagnosis not present

## 2022-06-26 DIAGNOSIS — K921 Melena: Secondary | ICD-10-CM | POA: Insufficient documentation

## 2022-06-26 DIAGNOSIS — C8590 Non-Hodgkin lymphoma, unspecified, unspecified site: Secondary | ICD-10-CM | POA: Insufficient documentation

## 2022-06-26 DIAGNOSIS — C8514 Unspecified B-cell lymphoma, lymph nodes of axilla and upper limb: Secondary | ICD-10-CM

## 2022-06-26 DIAGNOSIS — R21 Rash and other nonspecific skin eruption: Secondary | ICD-10-CM | POA: Diagnosis not present

## 2022-06-26 NOTE — Progress Notes (Signed)
DISCONTINUE ON PATHWAY REGIMEN - Lymphoma and CLL     A cycle is every 21 days:     Prednisone      Rituximab-xxxx      Cyclophosphamide      Vincristine      Etoposide   **Always confirm dose/schedule in your pharmacy ordering system**  REASON: Disease Progression PRIOR TREATMENT: [id number]: R(IV)-CEOP (Etoposide IV D1-3) q21 Days x 3 Cycles, Followed by Radiation Therapy After Completion TREATMENT RESPONSE: Partial Response (PR)  START ON PATHWAY REGIMEN - Lymphoma and CLL     A cycle is every 21 days:     Rituximab-xxxx      Polatuzumab vedotin-piiq      Bendamustine   **Always confirm dose/schedule in your pharmacy ordering system**  Patient Characteristics: Diffuse Large B-Cell Lymphoma or Follicular Lymphoma, Grade 3B, Relapsed / Refractory, All Stages,  Second Line, Relapse ? 12 Months From or Refractory to Prior Chemoimmunotherapy, Not a Candidate for CAR T-Cell Therapy Disease Type: Not Applicable Disease Type: Diffuse Large B-Cell Lymphoma Disease Type: Not Applicable Line of therapy: Relapsed / Refractory - Second Line Time to Relapse: Relapse ? 12 Months from Prior Chemoimmunotherapy Patient Characteristics: Not a Candidate for CAR T-Cell Therapy Intent of Therapy: Non-Curative / Palliative Intent, Discussed with Patient

## 2022-06-26 NOTE — Progress Notes (Signed)
[facility] OFFICE PROGRESS NOTE   Diagnosis: Non-Hodgkin's lymphoma  INTERVAL HISTORY:   Amanda Sellers returns as scheduled.  She notes improvement in the right arm swelling and pain since beginning prednisone and hydromorphone.  She is taking hydromorphone about every 4 hours.  No fevers or sweats.  She has a good appetite.  No diarrhea.  No nausea or vomiting.  Objective:  Vital signs in last 24 hours:  Blood pressure 132/65, pulse 76, temperature 98.1 F (36.7 C), temperature source Oral, resp. rate 18, height 5\' 6"  (1.676 m), weight 197 lb (89.4 kg), SpO2 96 %.    HEENT: No thrush. Lymphatics: Diffuse firm fullness right axilla.  No discrete mass. Resp: Lungs clear bilaterally. Cardio: Regular rate and rhythm. GI: Abdomen soft and nontender. Vascular: No leg edema.  Right arm is less edematous. Breast: Right breast is edematous, warm to touch.   Lab Results:  Lab Results  Component Value Date   WBC 4.8 06/23/2022   HGB 13.9 06/23/2022   HCT 41.2 06/23/2022   MCV 90.7 06/23/2022   PLT 134 (L) 06/23/2022   NEUTROABS 3.7 06/23/2022    Imaging:  CT CHEST ABDOMEN PELVIS WO CONTRAST  Result Date: 06/25/2022 CLINICAL DATA:  Recurrent non-Hodgkin lymphoma. Assess treatment response. * Tracking Code: BO * EXAM: CT CHEST, ABDOMEN AND PELVIS WITHOUT CONTRAST TECHNIQUE: Multidetector CT imaging of the chest, abdomen and pelvis was performed following the standard protocol without IV contrast. RADIATION DOSE REDUCTION: This exam was performed according to the departmental dose-optimization program which includes automated exposure control, adjustment of the mA and/or kV according to patient size and/or use of iterative reconstruction technique. COMPARISON:  02/23/2022 CT chest, abdomen and pelvis. FINDINGS: CT CHEST FINDINGS Cardiovascular: Normal heart size. No significant pericardial effusion/thickening. Left anterior descending and left circumflex coronary  atherosclerosis. Atherosclerotic nonaneurysmal thoracic aorta. Normal caliber pulmonary arteries. Mediastinum/Nodes: No significant thyroid nodules. Unremarkable esophagus. Numerous newly enlarged right axillary lymph nodes measuring up to the 2.4 cm short axis diameter (series 4/image 24). More superiorly in the right axilla, there is in ill-defined 8.9 x 5.3 cm solid mass with surrounding fat infiltration (series 4/image 11), intimately associated with posterior margin of the right pectoralis muscles, markedly increased from 2.8 x 1.5 cm on 02/23/2022 CT. No left axillary adenopathy. New mild right internal mammary adenopathy up to 1.0 cm short axis diameter (series 4/image 22). No additional pathologically enlarged mediastinal nodes. No discrete hilar adenopathy on these noncontrast images. Lungs/Pleura: No pneumothorax. No pleural effusion. Several (greater than 5) new indistinct solid pulmonary nodules scattered throughout the right greater than left lungs, for example measuring 1.7 cm in the medial basilar right lower lobe (series 6/image 111), 0.7 cm in the medial superior segment right lower lobe (series 6/image 52) and 0.4 cm in the peripheral right middle lobe (series 6/image 72). Anterior left upper lobe 0.3 cm nodule (series 6/image 42) is stable. Musculoskeletal: No aggressive appearing focal osseous lesions. Asymmetric right breast enlargement, right breast skin thickening and right breast parenchymal density, significantly worsened in the interval. Chronic mild T6 and moderate T12 vertebral compression fractures. Mild-to-moderate thoracic spondylosis. CT ABDOMEN PELVIS FINDINGS Hepatobiliary: At least 3 new indistinct hypodense liver lesions measuring 1.3 cm at the left liver dome (series 4/image 45), 1.7 cm at the anterior right liver dome (series 4/image 47) and 1.3 cm in the peripheral right liver (series 4/image 53). Cholelithiasis. No biliary ductal dilatation. Pancreas: Normal, with no mass or  duct dilation. Spleen: Normal size.  No mass. Adrenals/Urinary Tract: Normal adrenals. No hydronephrosis. Punctate calcifications in the mid to lower right renal sinus, favor vascular calcifications. Normal bladder. Stomach/Bowel: Large hiatal hernia. Stomach is nondistended and otherwise normal. Normal caliber small bowel with no small bowel wall thickening. Normal appendix. Oral contrast transits to the right colon. Normal large bowel with no diverticulosis, large bowel wall thickening or pericolonic fat stranding. Vascular/Lymphatic: Atherosclerotic nonaneurysmal abdominal aorta. No pathologically enlarged lymph nodes in the abdomen or pelvis. Reproductive: Grossly normal uterus.  No adnexal mass. Other: No pneumoperitoneum, ascites or focal fluid collection. Increased subcutaneous edema throughout the lateral right abdominal wall. Musculoskeletal: No aggressive appearing focal osseous lesions. Fixation hardware partially visualized in the proximal left femur. Marked lumbar spondylosis. IMPRESSION: 1. Significant interval progression of disease. 2. Bulky right axillary lymphadenopathy is substantially increased since 02/23/2022 CT and potentially muscle invasive into the right pectoralis muscles. New right internal mammary lymphadenopathy. 3. Several new indistinct solid pulmonary nodules scattered throughout the right greater than left lungs, suspicious for pulmonary lymphoma. 4. At least three new indistinct hypodense liver lesions, nonspecific but suspicious for hepatic lymphoma. 5. Asymmetric right breast enlargement, right breast skin thickening and right breast parenchymal density, significantly worsened in the interval. Increased subcutaneous edema throughout the lateral right abdominal wall. 6. Chronic findings include: Large hiatal hernia. Cholelithiasis. Chronic mild T6 and moderate T12 vertebral compression fractures. Aortic Atherosclerosis (ICD10-I70.0). Electronically Signed   By: Delbert Phenix M.D.    On: 06/25/2022 12:43    Medications: I have reviewed the patient's current medications.  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 Severe microcytic anemia, status post transfusion with packed red blood cells 07/23/2014   Normal ferritin, low transferrin saturation, "scant" bone marrow iron stores, review of peripheral blood smear consistent with iron deficiency anemia   colonoscopy and upper endoscopy 09/23/2014 3. Exertional dyspnea /orthopnea-most likely secondary to congestive heart failure   4. History of B-12 deficiency   5. Rash over the trunk and extremities 08/18/2014. Appears consistent with a drug rash. Resolved 08/28/2014. Rash 11/18/2014, likely secondary to a contrast allergy  , diagnostic contrast will be listed as an allergy 6. Colonoscopy 06/03/2008. Medium sized internal hemorrhoids.   7. Admission with atrial flutter with a rapid ventricular response 10/15/2014-status post cardioversion 10/16/2014, maintained on     apixaban Repeat cardioversion 11/30/2014   8. Right olecranon skin lesion-likely a lipoma or cyst 9. 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  Large right axillary mass 11/24/2021 CT with numerous bulky matted appearing right axillary lymph nodes, largest discretely measurable node measuring 4.6 x 4.3 cm.  Severe edema of the right breast and  right chest wall.  No lymphadenopathy or metastatic disease in the abdomen or pelvis.   Bilateral mammogram and ultrasound right breast 11/29/2021-suspicious mass possibly a replaced lymph node right axilla measuring 4.3 cm.  At least 1 additional abnormal lymph node with cortical thickening identified in the right axilla.  Diffuse right breast skin thickening and increased trabeculation.   Biopsy right axillary node 11/29/2021-high-grade B-cell lymphoma with proliferation rate approaching 100%,  29% of the  cells kappa restricted CD10 positive B cells Cycle 1 CEOP-Rituxan 12/06/2021 Cycle 2 CEOP-Rituxan 12/27/2021, Udenyca Cycle 3 CEOP-Rituxan 01/18/2022, Udenyca Cycle 4 CEOP-Rituxan 02/08/2022, Udenyca Restaging chest CT 02/23/2022-right axilla is incompletely imaged but there appears to have been substantial decrease in size of bulky right axillary and subpectoral lymphadenopathy.  Matted soft tissue remains in this vicinity without discretely measurable lymph nodes.  No other enlarged mediastinal, hilar or axillary lymph nodes.  Interval resolution of previously seen soft tissue edema involving the right breast and chest wall. Cycle 5 CEOP-Rituxan 03/14/2022 Cycle 6 CEOP-Rituxan 04/04/2022 CTs 06/25/2022-significant interval progression of disease.  Bulky right axillary lymphadenopathy markedly increased and potentially muscle invasive into the right pectoralis muscles.  New right internal mammary lymphadenopathy.  Several new indistinct solid pulmonary nodules scattered throughout the right greater than left lungs.  At least 3 new indistinct hypodense liver lesions.  Asymmetric right breast enlargement, right breast skin thickening and right breast parenchymal density.  Increased subcutaneous edema throughout the lateral right abdominal wall.   11.  Left lower extremity cellulitis 12/21/2021.  Improved since beginning doxycycline 12/19/2021.  14-day course planned.  Disposition: Amanda Sellers has relapsed  high-grade non-Hodgkin's lymphoma with significant disease progression.  We reviewed the CT images and report with her and her daughter at today's visit.  We discussed treatment options.  Dr. Kalman Drape recommendation is to begin treatment with polatuzumab/Bendamustine/Rituxan.  We reviewed potential toxicities associated with polatuzumab including bone marrow toxicity, infusion reaction, neuropathy, PML.  She has been treated with Bendamustine and Rituxan in the past and is aware of the potential side effects.  She was provided with printed information on all 3.  Plan to hold Bendamustine with cycle 1, potentially add with cycle 2 pending tolerance. Ms. Cavell agrees to proceed.   We discussed the potential for tumor lysis syndrome.  She will begin allopurinol 300 mg daily for 10 days on 06/28/2022.  She will begin anti-infective prophylaxis with acyclovir and Bactrim.  She has poor peripheral venous access.  She agrees to placement of a PICC line.  She will taper prednisone to 40 mg daily.  Continue hydromorphone every 4 hours as needed.  She will return for cycle 1 polatuzumab/Rituxan 06/29/2022.  We will see her in follow-up with labs 07/10/2022.  She will contact the office in the interim with any problems.  Patient seen with Dr. Truett Perna.    Lonna Cobb ANP/GNP-BC   06/26/2022  3:56 PM   This was a shared visit with Lonna Cobb.  Amanda Sellers was interviewed and examined.  We reviewed the restaging CT findings and images with her.  She has clinical and radiologic evidence of recurrence/progression of non-Hodgkin's lymphoma.  We do not recommend a biopsy or a staging PET scan.  Her clinical status has improved with prednisone over the past several days.  We decreased the prednisone dose today.  We discussed treatment options with Amanda Sellers and her daughter.  She does not appear to be a candidate for stem cell or CAR-T therapy.  We discussed standard salvage systemic therapy options,  observation/comfort care, and polatuzumab/rituximab +/- Bendamustine.  Ms. Prothero would like to proceed with a trial of salvage therapy.  I recommend polatuzumab/rituximab.  We will consider adding Bendamustine with cycle 2 depending on tolerance.  We reviewed potential toxicities associated with this regimen including the chance of an allergic reaction, nausea, neuropathy, hematologic toxicity, infection, and bleeding.  She will be placed on allopurinol prophylaxis for tumor lysis syndrome.  She will begin  acyclovir and Bactrim prophylaxis for atypical infection.  She has poor peripheral IV access.  We will schedule PICC placement with the plan to begin cycle 1 on 06/30/2022.  She will return for an office and nadir CBC on 07/10/2022.  I was present for greater than 50% of today's visit.  I performed medical stage making.  A chemotherapy plan was entered today.  Mancel Bale, MD

## 2022-06-27 ENCOUNTER — Telehealth: Payer: Self-pay | Admitting: *Deleted

## 2022-06-27 ENCOUNTER — Other Ambulatory Visit: Payer: Self-pay | Admitting: *Deleted

## 2022-06-27 DIAGNOSIS — C8514 Unspecified B-cell lymphoma, lymph nodes of axilla and upper limb: Secondary | ICD-10-CM

## 2022-06-27 MED ORDER — ALLOPURINOL 300 MG PO TABS
300.0000 mg | ORAL_TABLET | Freq: Every day | ORAL | 0 refills | Status: DC
Start: 2022-06-27 — End: 2022-07-10

## 2022-06-27 MED ORDER — SULFAMETHOXAZOLE-TRIMETHOPRIM 800-160 MG PO TABS
1.0000 | ORAL_TABLET | ORAL | 5 refills | Status: DC
Start: 2022-06-28 — End: 2022-07-17

## 2022-06-27 MED ORDER — ACYCLOVIR 400 MG PO TABS
400.0000 mg | ORAL_TABLET | Freq: Two times a day (BID) | ORAL | 5 refills | Status: DC
Start: 2022-06-27 — End: 2022-07-17

## 2022-06-27 NOTE — Telephone Encounter (Addendum)
PICC line scheduled for 06/28/22 at [facility] 11:00/11:30. Daughter, Arline Asp notified. Confirmed to send the allopurinol, Bactrim and Acyclovir to [facility] instead of [facility].

## 2022-06-28 ENCOUNTER — Ambulatory Visit (HOSPITAL_COMMUNITY)
Admission: RE | Admit: 2022-06-28 | Discharge: 2022-06-28 | Disposition: A | Discharge status: Home / Self Care | Payer: PPO | Source: Ambulatory Visit | Attending: Nurse Practitioner | Admitting: Nurse Practitioner

## 2022-06-28 ENCOUNTER — Encounter: Payer: Self-pay | Admitting: Oncology

## 2022-06-28 ENCOUNTER — Inpatient Hospital Stay: Payer: PPO

## 2022-06-28 ENCOUNTER — Other Ambulatory Visit: Payer: Self-pay | Admitting: Nurse Practitioner

## 2022-06-28 DIAGNOSIS — C8514 Unspecified B-cell lymphoma, lymph nodes of axilla and upper limb: Secondary | ICD-10-CM

## 2022-06-28 DIAGNOSIS — Z452 Encounter for adjustment and management of vascular access device: Secondary | ICD-10-CM | POA: Diagnosis not present

## 2022-06-28 MED ORDER — HEPARIN SOD (PORK) LOCK FLUSH 100 UNIT/ML IV SOLN
INTRAVENOUS | Status: AC
  Filled 2022-06-28: qty 5

## 2022-06-28 MED ORDER — LIDOCAINE-EPINEPHRINE 1 %-1:100000 IJ SOLN
INTRAMUSCULAR | Status: AC
  Filled 2022-06-28: qty 1

## 2022-06-28 NOTE — Procedures (Signed)
Interventional Radiology Procedure Note  Procedure: LUE DL PICC    Complications: None  Estimated Blood Loss:  0  Findings: TIP SVCRA    Sharen Counter, MD

## 2022-06-29 ENCOUNTER — Other Ambulatory Visit: Payer: Self-pay | Admitting: Nurse Practitioner

## 2022-06-29 ENCOUNTER — Inpatient Hospital Stay: Payer: PPO

## 2022-06-29 ENCOUNTER — Inpatient Hospital Stay: Payer: PPO | Admitting: Oncology

## 2022-06-29 ENCOUNTER — Other Ambulatory Visit (HOSPITAL_BASED_OUTPATIENT_CLINIC_OR_DEPARTMENT_OTHER): Payer: Self-pay

## 2022-06-29 ENCOUNTER — Other Ambulatory Visit: Payer: Self-pay | Admitting: Oncology

## 2022-06-29 VITALS — BP 116/64 | HR 73 | Temp 98.2°F | Resp 18

## 2022-06-29 DIAGNOSIS — C8514 Unspecified B-cell lymphoma, lymph nodes of axilla and upper limb: Secondary | ICD-10-CM

## 2022-06-29 DIAGNOSIS — Z5112 Encounter for antineoplastic immunotherapy: Secondary | ICD-10-CM | POA: Diagnosis not present

## 2022-06-29 LAB — CBC WITH DIFFERENTIAL (CANCER CENTER ONLY)
Abs Immature Granulocytes: 0.03 10*3/uL (ref 0.00–0.07)
Basophils Absolute: 0 10*3/uL (ref 0.0–0.1)
Basophils Relative: 0 %
Eosinophils Absolute: 0 10*3/uL (ref 0.0–0.5)
Eosinophils Relative: 1 %
HCT: 40.7 % (ref 36.0–46.0)
Hemoglobin: 14.2 g/dL (ref 12.0–15.0)
Immature Granulocytes: 1 %
Lymphocytes Relative: 10 %
Lymphs Abs: 0.5 10*3/uL — ABNORMAL LOW (ref 0.7–4.0)
MCH: 31.1 pg (ref 26.0–34.0)
MCHC: 34.9 g/dL (ref 30.0–36.0)
MCV: 89.1 fL (ref 80.0–100.0)
Monocytes Absolute: 0.8 10*3/uL (ref 0.1–1.0)
Monocytes Relative: 15 %
Neutro Abs: 4 10*3/uL (ref 1.7–7.7)
Neutrophils Relative %: 73 %
Platelet Count: 149 10*3/uL — ABNORMAL LOW (ref 150–400)
RBC: 4.57 MIL/uL (ref 3.87–5.11)
RDW: 13.1 % (ref 11.5–15.5)
WBC Count: 5.4 10*3/uL (ref 4.0–10.5)
nRBC: 0 % (ref 0.0–0.2)

## 2022-06-29 LAB — CMP (CANCER CENTER ONLY)
ALT: 10 U/L (ref 0–44)
AST: 17 U/L (ref 15–41)
Albumin: 4.1 g/dL (ref 3.5–5.0)
Alkaline Phosphatase: 50 U/L (ref 38–126)
Anion gap: 13 (ref 5–15)
BUN: 27 mg/dL — ABNORMAL HIGH (ref 8–23)
CO2: 30 mmol/L (ref 22–32)
Calcium: 9.9 mg/dL (ref 8.9–10.3)
Chloride: 94 mmol/L — ABNORMAL LOW (ref 98–111)
Creatinine: 0.98 mg/dL (ref 0.44–1.00)
GFR, Estimated: 56 mL/min — ABNORMAL LOW (ref >60)
Glucose, Bld: 144 mg/dL — ABNORMAL HIGH (ref 70–99)
Potassium: 3.7 mmol/L (ref 3.5–5.1)
Sodium: 137 mmol/L (ref 135–145)
Total Bilirubin: 0.7 mg/dL (ref 0.3–1.2)
Total Protein: 6.1 g/dL — ABNORMAL LOW (ref 6.5–8.1)

## 2022-06-29 LAB — URIC ACID: Uric Acid, Serum: 8 mg/dL — ABNORMAL HIGH (ref 2.5–7.1)

## 2022-06-29 LAB — LACTATE DEHYDROGENASE: LDH: 457 U/L — ABNORMAL HIGH (ref 98–192)

## 2022-06-29 MED ORDER — HEPARIN SOD (PORK) LOCK FLUSH 100 UNIT/ML IV SOLN
250.0000 [IU] | Freq: Once | INTRAVENOUS | Status: AC | PRN
  Administered 2022-06-29: 250 [IU]

## 2022-06-29 MED ORDER — SODIUM CHLORIDE 0.9% FLUSH
10.0000 mL | INTRAVENOUS | Status: DC | PRN
  Administered 2022-06-29: 10 mL

## 2022-06-29 MED ORDER — SODIUM CHLORIDE 0.9 % IV SOLN
10.0000 mg | Freq: Once | INTRAVENOUS | Status: AC
  Administered 2022-06-29: 10 mg via INTRAVENOUS
  Filled 2022-06-29: qty 10

## 2022-06-29 MED ORDER — ACETAMINOPHEN 325 MG PO TABS
650.0000 mg | ORAL_TABLET | Freq: Once | ORAL | Status: AC
  Administered 2022-06-29: 650 mg via ORAL
  Filled 2022-06-29: qty 2

## 2022-06-29 MED ORDER — SODIUM CHLORIDE 0.9 % IV SOLN
375.0000 mg/m2 | Freq: Once | INTRAVENOUS | Status: DC
  Filled 2022-06-29: qty 80

## 2022-06-29 MED ORDER — PALONOSETRON HCL INJECTION 0.25 MG/5ML
0.2500 mg | Freq: Once | INTRAVENOUS | Status: AC
  Administered 2022-06-29: 0.25 mg via INTRAVENOUS
  Filled 2022-06-29: qty 5

## 2022-06-29 MED ORDER — SODIUM CHLORIDE 0.9 % IV SOLN
1.4000 mg/kg | Freq: Once | INTRAVENOUS | Status: AC
  Administered 2022-06-29: 120 mg via INTRAVENOUS
  Filled 2022-06-29: qty 6

## 2022-06-29 MED ORDER — SODIUM CHLORIDE 0.9 % IV SOLN: Freq: Once | INTRAVENOUS | Status: AC

## 2022-06-29 MED ORDER — SODIUM CHLORIDE 0.9 % IV SOLN
375.0000 mg/m2 | Freq: Once | INTRAVENOUS | Status: AC
  Administered 2022-06-29: 800 mg via INTRAVENOUS
  Filled 2022-06-29: qty 50

## 2022-06-29 MED ORDER — DIPHENHYDRAMINE HCL 25 MG PO CAPS
50.0000 mg | ORAL_CAPSULE | Freq: Once | ORAL | Status: AC
  Administered 2022-06-29: 50 mg via ORAL
  Filled 2022-06-29: qty 2

## 2022-06-29 NOTE — Patient Instructions (Addendum)
[facility]  Discharge Instructions: Thank you for choosing [facility] to provide your oncology and hematology care.   If you have a lab appointment with the Cancer Center, please go directly to the Cancer Center and check in at the registration area.   Wear comfortable clothing and clothing appropriate for easy access to any Portacath or PICC line.   We strive to give you quality time with your provider. You may need to reschedule your appointment if you arrive late (15 or more minutes).  Arriving late affects you and other patients whose appointments are after yours.  Also, if you miss three or more appointments without notifying the office, you may be dismissed from the clinic at the provider's discretion.      For prescription refill requests, have your pharmacy contact our office and allow 72 hours for refills to be completed.    Today you received the following chemotherapy and/or immunotherapy agents: rituximab, polatuzumab      To help prevent nausea and vomiting after your treatment, we encourage you to take your nausea medication as directed.  BELOW ARE SYMPTOMS THAT SHOULD BE REPORTED IMMEDIATELY: *FEVER GREATER THAN 100.4 F (38 C) OR HIGHER *CHILLS OR SWEATING *NAUSEA AND VOMITING THAT IS NOT CONTROLLED WITH YOUR NAUSEA MEDICATION *UNUSUAL SHORTNESS OF BREATH *UNUSUAL BRUISING OR BLEEDING *URINARY PROBLEMS (pain or burning when urinating, or frequent urination) *BOWEL PROBLEMS (unusual diarrhea, constipation, pain near the anus) TENDERNESS IN MOUTH AND THROAT WITH OR WITHOUT PRESENCE OF ULCERS (sore throat, sores in mouth, or a toothache) UNUSUAL RASH, SWELLING OR PAIN  UNUSUAL VAGINAL DISCHARGE OR ITCHING   Items with * indicate a potential emergency and should be followed up as soon as possible or go to the Emergency Department if any problems should occur.  Please show the CHEMOTHERAPY ALERT CARD or IMMUNOTHERAPY ALERT  CARD at check-in to the Emergency Department and triage nurse.  Should you have questions after your visit or need to cancel or reschedule your appointment, please contact [facility]  Dept: [phone]  and follow the prompts.  Office hours are 8:00 a.m. to 4:30 p.m. Monday - Friday. Please note that voicemails left after 4:00 p.m. may not be returned until the following business day.  We are closed weekends and major holidays. You have access to a nurse at all times for urgent questions. Please call the main number to the clinic Dept: [phone] and follow the prompts.   For any non-urgent questions, you may also contact your provider using MyChart. We now offer e-Visits for anyone 9 and older to request care online for non-urgent symptoms. For details visit [email or web address].   Also download the MyChart app! Go to the app store, search "MyChart", open the app, select [place], and log in with your MyChart username and password.  Rituximab Injection What is this medication? RITUXIMAB (ri TUX i mab) treats leukemia and lymphoma. It works by blocking a protein that causes cancer cells to grow and multiply. This helps to slow or stop the spread of cancer cells. It may also be used to treat autoimmune conditions, such as arthritis. It works by slowing down an overactive immune system. It is a monoclonal antibody. This medicine may be used for other purposes; ask your health care provider or pharmacist if you have questions. COMMON BRAND NAME(S): RIABNI, Rituxan, RUXIENCE, truxima What should I tell my care team before I take this medication? They need to  know if you have any of these conditions: Chest pain Heart disease Immune system problems Infection, such as chickenpox, cold sores, hepatitis B, herpes Irregular heartbeat or rhythm Kidney disease Low blood counts, such as low white cells, platelets, red cells Lung disease Recent or upcoming  vaccine An unusual or allergic reaction to rituximab, other medications, foods, dyes, or preservatives Pregnant or trying to get pregnant Breast-feeding How should I use this medication? This medication is injected into a vein. It is given by a care team in a hospital or clinic setting. A special MedGuide will be given to you before each treatment. Be sure to read this information carefully each time. Talk to your care team about the use of this medication in children. While this medication may be prescribed for children as young as 6 months for selected conditions, precautions do apply. Overdosage: If you think you have taken too much of this medicine contact a poison control center or emergency room at once. NOTE: This medicine is only for you. Do not share this medicine with others. What if I miss a dose? Keep appointments for follow-up doses. It is important not to miss your dose. Call your care team if you are unable to keep an appointment. What may interact with this medication? Do not take this medication with any of the following: Live vaccines This medication may also interact with the following: Cisplatin This list may not describe all possible interactions. Give your health care provider a list of all the medicines, herbs, non-prescription drugs, or dietary supplements you use. Also tell them if you smoke, drink alcohol, or use illegal drugs. Some items may interact with your medicine. What should I watch for while using this medication? Your condition will be monitored carefully while you are receiving this medication. You may need blood work while taking this medication. This medication can cause serious infusion reactions. To reduce the risk your care team may give you other medications to take before receiving this one. Be sure to follow the directions from your care team. This medication may increase your risk of getting an infection. Call your care team for advice if you get a  fever, chills, sore throat, or other symptoms of a cold or flu. Do not treat yourself. Try to avoid being around people who are sick. Call your care team if you are around anyone with measles, chickenpox, or if you develop sores or blisters that do not heal properly. Avoid taking medications that contain aspirin, acetaminophen, ibuprofen, naproxen, or ketoprofen unless instructed by your care team. These medications may hide a fever. This medication may cause serious skin reactions. They can happen weeks to months after starting the medication. Contact your care team right away if you notice fevers or flu-like symptoms with a rash. The rash may be red or purple and then turn into blisters or peeling of the skin. You may also notice a red rash with swelling of the face, lips, or lymph nodes in your neck or under your arms. In some patients, this medication may cause a serious brain infection that may cause death. If you have any problems seeing, thinking, speaking, walking, or standing, tell your care team right away. If you cannot reach your care team, urgently seek another source of medical care. Talk to your care team if you may be pregnant. Serious birth defects can occur if you take this medication during pregnancy and for 12 months after the last dose. You will need a negative pregnancy test  before starting this medication. Contraception is recommended while taking this medication and for 12 months after the last dose. Your care team can help you find the option that works for you. Do not breastfeed while taking this medication and for at least 6 months after the last dose. What side effects may I notice from receiving this medication? Side effects that you should report to your care team as soon as possible: Allergic reactions or angioedema--skin rash, itching or hives, swelling of the face, eyes, lips, tongue, arms, or legs, trouble swallowing or breathing Bowel blockage--stomach cramping, unable to  have a bowel movement or pass gas, loss of appetite, vomiting Dizziness, loss of balance or coordination, confusion or trouble speaking Heart attack--pain or tightness in the chest, shoulders, arms, or jaw, nausea, shortness of breath, cold or clammy skin, feeling faint or lightheaded Heart rhythm changes--fast or irregular heartbeat, dizziness, feeling faint or lightheaded, chest pain, trouble breathing Infection--fever, chills, cough, sore throat, wounds that don't heal, pain or trouble when passing urine, general feeling of discomfort or being unwell Infusion reactions--chest pain, shortness of breath or trouble breathing, feeling faint or lightheaded Kidney injury--decrease in the amount of urine, swelling of the ankles, hands, or feet Liver injury--right upper belly pain, loss of appetite, nausea, light-colored stool, dark yellow or brown urine, yellowing skin or eyes, unusual weakness or fatigue Redness, blistering, peeling, or loosening of the skin, including inside the mouth Stomach pain that is severe, does not go away, or gets worse Tumor lysis syndrome (TLS)--nausea, vomiting, diarrhea, decrease in the amount of urine, dark urine, unusual weakness or fatigue, confusion, muscle pain or cramps, fast or irregular heartbeat, joint pain Side effects that usually do not require medical attention (report to your care team if they continue or are bothersome): Headache Joint pain Nausea Runny or stuffy nose Unusual weakness or fatigue This list may not describe all possible side effects. Call your doctor for medical advice about side effects. You may report side effects to FDA at 1-800-FDA-1088. Where should I keep my medication? This medication is given in a hospital or clinic. It will not be stored at home. NOTE: This sheet is a summary. It may not cover all possible information. If you have questions about this medicine, talk to your doctor, pharmacist, or health care provider.  2024  Elsevier/Gold Standard (2021-06-02 00:00:00)  Polatuzumab Vedotin Injection What is this medication? POLATUZUMAB VEDOTIN (poe la tooz ue mab ve doe tin) treats lymphoma. It works by blocking a protein that causes cancer cells to grow and multiply. This helps to slow or stop the spread of cancer cells. This medicine may be used for other purposes; ask your health care provider or pharmacist if you have questions. COMMON BRAND NAME(S): [place] What should I tell my care team before I take this medication? They need to know if you have any of these conditions: Infection, such as chickenpox, cold sores, herpes Liver disease Low blood cell levels, such as white cells, red cells, platelets Tingling of the fingers or toes or other nerve disorder An unusual or allergic reaction to polatuzumab vedotin, other medications, foods, dyes, or preservatives If you or your partner are pregnant or trying to get pregnant Breast-feeding How should I use this medication? This medication is infused into a vein. It is given by your care team in a hospital or clinic setting. Talk to your care team about the use of this medication in children. Special care may be needed. Overdosage: If you think you  have taken too much of this medicine contact a poison control center or emergency room at once. NOTE: This medicine is only for you. Do not share this medicine with others. What if I miss a dose? Keep appointments for follow-up doses. It is important not to miss your dose. Call your care team if you are unable to keep an appointment. What may interact with this medication? Certain antibiotics, such as clarithromycin, telithromycin Certain antivirals for HIV or AIDS Certain medications for fungal infections, such as ketoconazole, itraconazole, posaconazole, voriconazole Certain medications for seizures, such as carbamazepine, phenobarbital, phenytoin This list may not describe all possible interactions. Give your health  care provider a list of all the medicines, herbs, non-prescription drugs, or dietary supplements you use. Also tell them if you smoke, drink alcohol, or use illegal drugs. Some items may interact with your medicine. What should I watch for while using this medication? Your condition will be monitored carefully while you are receiving this medication. This medication may make you feel generally unwell. This is not uncommon as chemotherapy can affect healthy cells as well as cancer cells. Report any side effects. Continue your course of treatment even though you feel ill unless your care team tells you to stop. You may need blood work while taking this medication. This medication may increase your risk of getting an infection. Call your care team for advice if you get a fever, chills, sore throat, or other symptoms of a cold or flu. Do not treat yourself. Try to avoid being around people who are sick. This medication may increase your risk to bruise or bleed. Call your care team if you notice any unusual bleeding. In some patients, this medication may cause a serious brain infection that may cause death. If you have any problems seeing, thinking, speaking, walking, or standing, tell your care team right away. If you cannot reach your care team, urgently seek other source of medical care. Talk to your care team if you wish to become pregnant or think you might be pregnant. This medication can cause serious birth defects if taken during pregnancy or for 3 months after the last dose.  A negative pregnancy test is required before starting this medication. A reliable form of contraception is recommended while taking this medication and for 3 months after the last dose. Talk to your care team about effective forms of contraception. Do not father a child while taking this medication or for 5 months after the last dose. Use a condom while having sex during this time period. Do not breastfeed while taking this  medication and for 2 months after the last dose. This medication may cause infertility. Talk to your care team if you are concerned about your fertility. What side effects may I notice from receiving this medication? Side effects that you should report to your care team as soon as possible: Allergic reactions--skin rash, itching, hives, swelling of the face, lips, tongue, or throat Dizziness, loss of balance or coordination, confusion or trouble speaking Infection--fever, chills, cough, sore throat, wounds that don't heal, pain or trouble when passing urine, general feeling of discomfort or being unwell Infusion reactions--chest pain, shortness of breath or trouble breathing, feeling faint or lightheaded Liver injury--right upper belly pain, loss of appetite, nausea, light-colored stool, dark yellow or brown urine, yellowing skin or eyes, unusual weakness or fatigue Low red blood cell level--unusual weakness or fatigue, dizziness, headache, trouble breathing Pain, tingling, or numbness in the hands or feet Stomach pain, unusual weakness  or fatigue, nausea, vomiting, diarrhea, or fever that lasts longer than expected Tumor lysis syndrome (TLS)--nausea, vomiting, diarrhea, decrease in the amount of urine, dark urine, unusual weakness or fatigue, confusion, muscle pain or cramps, fast or irregular heartbeat, joint pain Unusual bruising or bleeding Side effects that usually do not require medical attention (report these to your care team if they continue or are bothersome): Diarrhea Dizziness Loss of appetite Vomiting Weight loss This list may not describe all possible side effects. Call your doctor for medical advice about side effects. You may report side effects to FDA at 1-800-FDA-1088. Where should I keep my medication? This medication is given in a hospital or clinic. It will not be stored at home. NOTE: This sheet is a summary. It may not cover all possible information. If you have  questions about this medicine, talk to your doctor, pharmacist, or health care provider.  2024 Elsevier/Gold Standard (2021-05-18 00:00:00)

## 2022-06-30 ENCOUNTER — Other Ambulatory Visit: Payer: Self-pay | Admitting: Nurse Practitioner

## 2022-06-30 ENCOUNTER — Other Ambulatory Visit (HOSPITAL_BASED_OUTPATIENT_CLINIC_OR_DEPARTMENT_OTHER): Payer: Self-pay

## 2022-06-30 ENCOUNTER — Telehealth: Payer: Self-pay

## 2022-06-30 ENCOUNTER — Other Ambulatory Visit (HOSPITAL_COMMUNITY): Payer: PPO

## 2022-06-30 DIAGNOSIS — C8514 Unspecified B-cell lymphoma, lymph nodes of axilla and upper limb: Secondary | ICD-10-CM

## 2022-06-30 MED ORDER — HYDROMORPHONE HCL 2 MG PO TABS
2.0000 mg | ORAL_TABLET | ORAL | 0 refills | Status: DC | PRN
Start: 2022-06-30 — End: 2022-07-17
  Filled 2022-06-30 – 2022-07-01 (×3): qty 60, 10d supply, fill #0

## 2022-06-30 NOTE — Telephone Encounter (Signed)
Called patient's daughter Amanda Sellers for first time chemo follow-up (per patient contact preferences in chart).  Unable to leave voicemail as mailbox was full.

## 2022-07-01 ENCOUNTER — Other Ambulatory Visit (HOSPITAL_BASED_OUTPATIENT_CLINIC_OR_DEPARTMENT_OTHER): Payer: Self-pay

## 2022-07-06 ENCOUNTER — Inpatient Hospital Stay: Payer: PPO

## 2022-07-06 ENCOUNTER — Other Ambulatory Visit: Payer: Self-pay

## 2022-07-06 ENCOUNTER — Encounter: Payer: Self-pay | Admitting: Oncology

## 2022-07-06 ENCOUNTER — Emergency Department (HOSPITAL_BASED_OUTPATIENT_CLINIC_OR_DEPARTMENT_OTHER)
Admission: EM | Admit: 2022-07-06 | Discharge: 2022-07-06 | Disposition: A | Discharge status: Home / Self Care | Payer: PPO | Attending: Emergency Medicine | Admitting: Emergency Medicine

## 2022-07-06 ENCOUNTER — Encounter (HOSPITAL_BASED_OUTPATIENT_CLINIC_OR_DEPARTMENT_OTHER): Payer: Self-pay | Admitting: Emergency Medicine

## 2022-07-06 ENCOUNTER — Emergency Department (HOSPITAL_BASED_OUTPATIENT_CLINIC_OR_DEPARTMENT_OTHER): Payer: PPO

## 2022-07-06 VITALS — BP 122/64 | HR 98 | Temp 97.8°F | Resp 20

## 2022-07-06 DIAGNOSIS — R251 Tremor, unspecified: Secondary | ICD-10-CM | POA: Diagnosis not present

## 2022-07-06 DIAGNOSIS — C8590 Non-Hodgkin lymphoma, unspecified, unspecified site: Secondary | ICD-10-CM | POA: Diagnosis not present

## 2022-07-06 DIAGNOSIS — R29818 Other symptoms and signs involving the nervous system: Secondary | ICD-10-CM | POA: Diagnosis not present

## 2022-07-06 DIAGNOSIS — C8514 Unspecified B-cell lymphoma, lymph nodes of axilla and upper limb: Secondary | ICD-10-CM

## 2022-07-06 DIAGNOSIS — R531 Weakness: Secondary | ICD-10-CM | POA: Diagnosis not present

## 2022-07-06 DIAGNOSIS — Z7901 Long term (current) use of anticoagulants: Secondary | ICD-10-CM | POA: Diagnosis not present

## 2022-07-06 LAB — CBC WITH DIFFERENTIAL (CANCER CENTER ONLY)
Abs Immature Granulocytes: 0.04 10*3/uL (ref 0.00–0.07)
Basophils Absolute: 0 10*3/uL (ref 0.0–0.1)
Basophils Relative: 0 %
Eosinophils Absolute: 0 10*3/uL (ref 0.0–0.5)
Eosinophils Relative: 0 %
HCT: 40.5 % (ref 36.0–46.0)
Hemoglobin: 13.6 g/dL (ref 12.0–15.0)
Immature Granulocytes: 1 %
Lymphocytes Relative: 5 %
Lymphs Abs: 0.3 10*3/uL — ABNORMAL LOW (ref 0.7–4.0)
MCH: 29.8 pg (ref 26.0–34.0)
MCHC: 33.6 g/dL (ref 30.0–36.0)
MCV: 88.8 fL (ref 80.0–100.0)
Monocytes Absolute: 0.3 10*3/uL (ref 0.1–1.0)
Monocytes Relative: 5 %
Neutro Abs: 6 10*3/uL (ref 1.7–7.7)
Neutrophils Relative %: 89 %
Platelet Count: 116 10*3/uL — ABNORMAL LOW (ref 150–400)
RBC: 4.56 MIL/uL (ref 3.87–5.11)
RDW: 12.5 % (ref 11.5–15.5)
WBC Count: 6.6 10*3/uL (ref 4.0–10.5)
nRBC: 0 % (ref 0.0–0.2)

## 2022-07-06 LAB — URINALYSIS, ROUTINE W REFLEX MICROSCOPIC
Bacteria, UA: NONE SEEN
Bilirubin Urine: NEGATIVE
Glucose, UA: NEGATIVE mg/dL
Hgb urine dipstick: NEGATIVE
Ketones, ur: NEGATIVE mg/dL
Nitrite: NEGATIVE
Specific Gravity, Urine: 1.014 (ref 1.005–1.030)
Trans Epithel, UA: 1
pH: 6.5 (ref 5.0–8.0)

## 2022-07-06 LAB — CMP (CANCER CENTER ONLY)
ALT: 16 U/L (ref 0–44)
AST: 24 U/L (ref 15–41)
Albumin: 4 g/dL (ref 3.5–5.0)
Alkaline Phosphatase: 42 U/L (ref 38–126)
Anion gap: 15 (ref 5–15)
BUN: 49 mg/dL — ABNORMAL HIGH (ref 8–23)
CO2: 28 mmol/L (ref 22–32)
Calcium: 11.4 mg/dL — ABNORMAL HIGH (ref 8.9–10.3)
Chloride: 92 mmol/L — ABNORMAL LOW (ref 98–111)
Creatinine: 1.58 mg/dL — ABNORMAL HIGH (ref 0.44–1.00)
GFR, Estimated: 31 mL/min — ABNORMAL LOW (ref >60)
Glucose, Bld: 180 mg/dL — ABNORMAL HIGH (ref 70–99)
Potassium: 4.7 mmol/L (ref 3.5–5.1)
Sodium: 135 mmol/L (ref 135–145)
Total Bilirubin: 1 mg/dL (ref 0.3–1.2)
Total Protein: 5.9 g/dL — ABNORMAL LOW (ref 6.5–8.1)

## 2022-07-06 LAB — URIC ACID: Uric Acid, Serum: 6.3 mg/dL (ref 2.5–7.1)

## 2022-07-06 LAB — BASIC METABOLIC PANEL
Anion gap: 13 (ref 5–15)
BUN: 47 mg/dL — ABNORMAL HIGH (ref 8–23)
CO2: 29 mmol/L (ref 22–32)
Calcium: 10.9 mg/dL — ABNORMAL HIGH (ref 8.9–10.3)
Chloride: 92 mmol/L — ABNORMAL LOW (ref 98–111)
Creatinine, Ser: 1.5 mg/dL — ABNORMAL HIGH (ref 0.44–1.00)
GFR, Estimated: 34 mL/min — ABNORMAL LOW (ref >60)
Glucose, Bld: 163 mg/dL — ABNORMAL HIGH (ref 70–99)
Potassium: 4.5 mmol/L (ref 3.5–5.1)
Sodium: 134 mmol/L — ABNORMAL LOW (ref 135–145)

## 2022-07-06 LAB — CBC
HCT: 38.4 % (ref 36.0–46.0)
Hemoglobin: 13.3 g/dL (ref 12.0–15.0)
MCH: 30.4 pg (ref 26.0–34.0)
MCHC: 34.6 g/dL (ref 30.0–36.0)
MCV: 87.9 fL (ref 80.0–100.0)
Platelets: 106 10*3/uL — ABNORMAL LOW (ref 150–400)
RBC: 4.37 MIL/uL (ref 3.87–5.11)
RDW: 12.6 % (ref 11.5–15.5)
WBC: 5.6 10*3/uL (ref 4.0–10.5)
nRBC: 0 % (ref 0.0–0.2)

## 2022-07-06 LAB — LACTATE DEHYDROGENASE: LDH: 419 U/L — ABNORMAL HIGH (ref 98–192)

## 2022-07-06 LAB — PHOSPHORUS: Phosphorus: 3.6 mg/dL (ref 2.5–4.6)

## 2022-07-06 LAB — CBG MONITORING, ED: Glucose-Capillary: 166 mg/dL — ABNORMAL HIGH (ref 70–99)

## 2022-07-06 MED ORDER — OXYCODONE-ACETAMINOPHEN 5-325 MG PO TABS
1.0000 | ORAL_TABLET | Freq: Once | ORAL | Status: AC
  Administered 2022-07-06: 1 via ORAL
  Filled 2022-07-06: qty 1

## 2022-07-06 MED ORDER — SODIUM CHLORIDE 0.9% FLUSH
10.0000 mL | Freq: Once | INTRAVENOUS | Status: AC
  Administered 2022-07-06: 10 mL via INTRAVENOUS

## 2022-07-06 MED ORDER — HEPARIN SOD (PORK) LOCK FLUSH 100 UNIT/ML IV SOLN
500.0000 [IU] | Freq: Once | INTRAVENOUS | Status: AC
  Administered 2022-07-06: 500 [IU] via INTRAVENOUS

## 2022-07-06 MED ORDER — HYDROMORPHONE HCL 2 MG PO TABS: 2.0000 mg | ORAL_TABLET | Freq: Once | ORAL | Status: DC

## 2022-07-06 MED ORDER — SODIUM CHLORIDE 0.9 % IV BOLUS
500.0000 mL | Freq: Once | INTRAVENOUS | Status: AC
  Administered 2022-07-06: 500 mL via INTRAVENOUS

## 2022-07-06 NOTE — ED Notes (Signed)
Pt discharged to home using teachback Method. Discharge instructions have been discussed with patient and/or family members. Pt verbally acknowledges understanding d/c instructions, has been given opportunity for questions to be answered, and endorses comprehension to checkout at registration before leaving.  

## 2022-07-06 NOTE — ED Provider Notes (Signed)
[place] [facility] DE[facility] AT [facility] Provider Note   CSN: [id number] Arrival date & time: 07/06/22  1408     History  Chief Complaint  Patient presents with   Extremity Weakness    Amanda Sellers is a 89 y.o. female with a history of lymphoma, on chemotherapy, presented ED with lower extremity weakness.  Her daughter who is a partial caretaker is present at the bedside, and reports that the patient has had abrupt worsening weakness in both of her legs, she typically is only able to stand from her wheelchair and ambulate on her legs but is not able to do that much in the past 24 hours.  The patient also 2-3 episodes where she felt like "she was going to urinate on herself but she did not".  The patient had an episode of right arm weakness and spasming of her right hand, but she has significant lymphedema related to her B-cell lymphoma.  Patient denies headache to me.  She was advised to come in for CT scan of her head by her oncologist today  HPI     Home Medications Prior to Admission medications   Medication Sig Start Date End Date Taking? Authorizing Provider  acyclovir (ZOVIRAX) 400 MG tablet Take 1 tablet (400 mg total) by mouth 2 (two) times daily. 06/27/22   Ladene Artist, MD  allopurinol (ZYLOPRIM) 300 MG tablet Take 1 tablet (300 mg total) by mouth daily. 06/27/22   Ladene Artist, MD  amiodarone (PACERONE) 200 MG tablet Take 0.5 tablets (100 mg total) by mouth daily. 09/05/21   Newman Nip, NP  cyanocobalamin (,VITAMIN B-12,) 1000 MCG/ML injection INJECT 1 ML INTO THE SKIN EVERY 30 DAYS. 07/27/20   McGowen, Maryjean Morn, MD  diclofenac Sodium (VOLTAREN) 1 % GEL APPLY TOPICALLY 4 TIMES DAILY TO JOINTS THAT ARE IN PAIN. 04/27/22   McGowen, Maryjean Morn, MD  docusate sodium (COLACE) 100 MG capsule Take 100 mg by mouth every other day. Patient not taking: Reported on 06/13/2022    [provider]  ELIQUIS 5 MG TABS tablet TAKE 1 TABLET BY MOUTH TWICE A DAY  05/31/22   Marykay Lex, MD  ENTRESTO 24-26 MG TAKE 1 TABLET BY MOUTH TWICE A DAY 03/28/22   Marykay Lex, MD  fish oil-omega-3 fatty acids 1000 MG capsule Take 1 g by mouth daily.    [provider]  fluticasone (CUTIVATE) 0.05 % cream Apply to affected area of lower legs twice daily as needed. 04/27/22   McGowen, Maryjean Morn, MD  furosemide (LASIX) 40 MG tablet Taking 1 tablet by mouth daily and 1/2 tablet in the afternoon 05/17/22   McGowen, Maryjean Morn, MD  HYDROcodone-acetaminophen (NORCO/VICODIN) 5-325 MG tablet 1-2 tabs po qid prn pain 06/16/22   McGowen, Maryjean Morn, MD  HYDROmorphone (DILAUDID) 2 MG tablet Take 1 tablet (2 mg total) by mouth every 4 (four) hours as needed for severe pain. 06/30/22   Rana Snare, NP  hydrOXYzine (VISTARIL) 25 MG capsule TAKE 1 TO 2 CAPSULES BY MOUTH AT BEDTIME FOR SLEEP 02/01/22   McGowen, Maryjean Morn, MD  levothyroxine (SYNTHROID) 112 MCG tablet TAKE 1 TABLET BY MOUTH EVERY DAY 11/23/21   McGowen, Maryjean Morn, MD  omeprazole (PRILOSEC) 20 MG capsule Take 1 capsule (20 mg total) by mouth daily. 12/27/21   McGowen, Maryjean Morn, MD  OXYGEN Inhale into the lungs. 1Lt at bedtime  as needed    [provider]  Potassium Chloride ER  20 MEQ TBCR Take 1 tablet by mouth daily. 09/28/21   McGowen, Maryjean Morn, MD  predniSONE (DELTASONE) 20 MG tablet Take 3 tablets (60 mg total) by mouth daily with breakfast. 06/23/22   Rana Snare, NP  prochlorperazine (COMPAZINE) 5 MG tablet Take 1 tablet (5 mg total) by mouth every 6 (six) hours as needed. 06/13/22   Ladene Artist, MD  rosuvastatin (CRESTOR) 20 MG tablet TAKE 1 TABLET BY MOUTH EVERY DAY 01/25/22   Marykay Lex, MD  sulfamethoxazole-trimethoprim (BACTRIM DS) 800-160 MG tablet Take 1 tablet by mouth 3 (three) times a week. 06/28/22   Ladene Artist, MD  Vitamin D, Ergocalciferol, (DRISDOL) 1.25 MG (50000 UNIT) CAPS capsule TAKE 1 CAPSULE (50,000 UNITS TOTAL) BY MOUTH TWO TIMES A WEEK 02/09/22   McGowen, Maryjean Morn, MD       Allergies    Aspirin, Calcium alginate, Morphine, and Iodinated contrast media    Review of Systems   Review of Systems  Physical Exam Updated Vital Signs BP (!) 138/108 (BP Location: Left Wrist)   Pulse (!) 104   Temp 97.6 F (36.4 C)   Resp 18   Wt 88.5 kg   SpO2 100%   BMI 31.47 kg/m  Physical Exam Constitutional:      General: She is not in acute distress. HENT:     Head: Normocephalic and atraumatic.  Eyes:     Conjunctiva/sclera: Conjunctivae normal.     Pupils: Pupils are equal, round, and reactive to light.  Cardiovascular:     Rate and Rhythm: Normal rate and regular rhythm.  Pulmonary:     Effort: Pulmonary effort is normal. No respiratory distress.  Abdominal:     General: There is no distension.     Tenderness: There is no abdominal tenderness.  Skin:    General: Skin is warm and dry.  Neurological:     General: No focal deficit present.     Mental Status: She is alert. Mental status is at baseline.     Comments: Resting tremor of the bilateral extremities 3/5 strength bilaterally in lower extremities, unable to bear weight standing/pivoting  Psychiatric:        Mood and Affect: Mood normal.        Behavior: Behavior normal.     ED Results / Procedures / Treatments   Labs (all labs ordered are listed, but only abnormal results are displayed) Labs Reviewed  BASIC METABOLIC PANEL  CBC  URINALYSIS, ROUTINE W REFLEX MICROSCOPIC  CBG MONITORING, ED    EKG None  Radiology No results found.  Procedures Procedures    Medications Ordered in ED Medications - No data to display  ED Course/ Medical Decision Making/ A&P                             Medical Decision Making Amount and/or Complexity of Data Reviewed Labs: ordered. Radiology: ordered.   Patient is here with generalized weakness, worsening of the lower extremities, also transient right hand clutching and grip strength now resolved.  Differential include electrolyte  derangement versus oncology or chemoinfusion side effects versus CVA versus other.  She does not have any evident localizing stroke symptoms at this time, as her bilateral leg weakness would not be consistent with stroke, but the question would be whether she had right more than left-sided weakness, as her exam is difficulty limited.  CT imaging of the head has been ordered.  Reviewed  her external records including her oncology evaluation and note from today.  Supplemental history provided by the patient's caretaker at the bedside.  The patient is signed out to Dr. Theresia Lo EDP pending f/u on labs, UA, and CT imaging.        Final Clinical Impression(s) / ED Diagnoses Final diagnoses:  Weakness    Rx / DC Orders ED Discharge Orders     None         Terald Sleeper, MD 07/06/22 1457

## 2022-07-06 NOTE — ED Triage Notes (Signed)
Pt arrives pov to triage in wheelchair, referred by Dr Truett Perna for CT scan r/t acute bilateral LE weakness. Pt aox4, speech clear per family.

## 2022-07-06 NOTE — Progress Notes (Signed)
Pt here for lab/picc flush and dressing change. Family expressing concerns that they are afraid that patient may have had a stroke. Family advised to take patient to the emergency room but patient had refused. This Nurse spoke with patient and patient is in agreement to go to the [facility] Emergency Room.

## 2022-07-06 NOTE — Discharge Instructions (Addendum)
You were seen in the emergency department for your weakness.  Your workup showed no signs of stroke, abnormal electrolytes, dehydration or infection.  Your weakness might be related to your cancer and recently restarting your chemotherapy and should follow-up with your oncologist as planned on Monday.  You should return to the emergency department if you have a fall and injure yourself, you have numbness or weakness on one side of the body compared to the other, you are confused and hard to wake up or if you have any other new or concerning symptoms.

## 2022-07-06 NOTE — ED Notes (Signed)
Pt would like to wait to obtain labs because she "just had them drawn upstairs at previous appt."

## 2022-07-06 NOTE — ED Provider Notes (Signed)
Patient signed out to me at 1500 by Dr. Troponin pending CT, labs and reassessment.  In short this is an 89 year old female with a past medical history of lymphoma recently restarted on chemotherapy presenting to the emergency department with generalized weakness.  The patient and her daughter report that she has had increasing weakness over the last day and did have an episode of right wrist contraction last night that resolved on its own.  She is wheelchair-bound at baseline but has needed increasing support with transitions.  On her initial arrival to the emergency department she was mildly tachycardic but otherwise hemodynamically stable in no acute distress without focal neurologic deficits.  The patient underwent head CT to evaluate for possible stroke as well as labs and urine to evaluate for cause of her weakness.  Patient's head CT showed no acute disease.  On my evaluation, the patient has no focal neurologic deficits including normal finger-to-nose and 5 out of 5 strength in all 4 extremities.  I have low suspicion for CVA.  Patient is pending her labs and urine at this time.  She does report increased urinary frequency.  Clinical Course as of 07/06/22 1750  Thu Jul 06, 2022  1650 Mildly low Na and Cl, Cr otherwise at baseline. She will be given gentle fluids. UA pending.  [VK]  1718 UA negative. Unclear etiology of worsening weakness, suspect likely in the setting of cancer and chemotherapy. [VK]  [id number] Patient feels well and would prefer to go home.  She has oncology follow-up on Monday.  She was given strict return precautions. [VK]    Clinical Course User Index [VK] Rexford Maus, DO      Rexford Maus, [place] 07/06/22 1750

## 2022-07-07 ENCOUNTER — Telehealth: Payer: Self-pay

## 2022-07-07 NOTE — Telephone Encounter (Signed)
I contacted Amanda Sellers's family to inquire about her status. Her daughter reported that she is experiencing constipation and plans to administer Miralax. [name] received a liter of normal saline and reports feeling slightly improved. She is currently eating and drinking, and her overall weakness has decreased compared to yesterday. [name] will continue to focus on her nutrition and hydration, as well as working towards a bowel movement. Her daughter is unsure of when her last bowel movement occurred. I recommended that [name] and her daughter begin taking a stool softener to help regulate her bowel movements.

## 2022-07-10 ENCOUNTER — Other Ambulatory Visit: Payer: Self-pay | Admitting: Nurse Practitioner

## 2022-07-10 ENCOUNTER — Inpatient Hospital Stay: Payer: PPO

## 2022-07-10 ENCOUNTER — Telehealth: Payer: Self-pay

## 2022-07-10 ENCOUNTER — Encounter (HOSPITAL_COMMUNITY): Payer: Self-pay

## 2022-07-10 ENCOUNTER — Inpatient Hospital Stay (HOSPITAL_COMMUNITY)
Admission: AD | Admit: 2022-07-10 | Discharge: 2022-07-24 | DRG: 951 | Disposition: E | Payer: PPO | Source: Ambulatory Visit | Attending: Internal Medicine | Admitting: Internal Medicine

## 2022-07-10 ENCOUNTER — Other Ambulatory Visit: Payer: Self-pay

## 2022-07-10 ENCOUNTER — Inpatient Hospital Stay: Payer: PPO | Admitting: Nurse Practitioner

## 2022-07-10 ENCOUNTER — Encounter: Payer: Self-pay | Admitting: Nurse Practitioner

## 2022-07-10 DIAGNOSIS — C8514 Unspecified B-cell lymphoma, lymph nodes of axilla and upper limb: Secondary | ICD-10-CM | POA: Diagnosis not present

## 2022-07-10 DIAGNOSIS — Z833 Family history of diabetes mellitus: Secondary | ICD-10-CM

## 2022-07-10 DIAGNOSIS — D631 Anemia in chronic kidney disease: Secondary | ICD-10-CM | POA: Diagnosis present

## 2022-07-10 DIAGNOSIS — I130 Hypertensive heart and chronic kidney disease with heart failure and stage 1 through stage 4 chronic kidney disease, or unspecified chronic kidney disease: Secondary | ICD-10-CM | POA: Diagnosis not present

## 2022-07-10 DIAGNOSIS — K921 Melena: Secondary | ICD-10-CM

## 2022-07-10 DIAGNOSIS — Z885 Allergy status to narcotic agent status: Secondary | ICD-10-CM

## 2022-07-10 DIAGNOSIS — D630 Anemia in neoplastic disease: Secondary | ICD-10-CM | POA: Diagnosis present

## 2022-07-10 DIAGNOSIS — Z7401 Bed confinement status: Secondary | ICD-10-CM

## 2022-07-10 DIAGNOSIS — H04123 Dry eye syndrome of bilateral lacrimal glands: Secondary | ICD-10-CM | POA: Diagnosis present

## 2022-07-10 DIAGNOSIS — Z79899 Other long term (current) drug therapy: Secondary | ICD-10-CM

## 2022-07-10 DIAGNOSIS — R627 Adult failure to thrive: Secondary | ICD-10-CM | POA: Diagnosis not present

## 2022-07-10 DIAGNOSIS — I5042 Chronic combined systolic (congestive) and diastolic (congestive) heart failure: Secondary | ICD-10-CM | POA: Diagnosis present

## 2022-07-10 DIAGNOSIS — E785 Hyperlipidemia, unspecified: Secondary | ICD-10-CM | POA: Diagnosis present

## 2022-07-10 DIAGNOSIS — K219 Gastro-esophageal reflux disease without esophagitis: Secondary | ICD-10-CM | POA: Diagnosis present

## 2022-07-10 DIAGNOSIS — E039 Hypothyroidism, unspecified: Secondary | ICD-10-CM | POA: Diagnosis not present

## 2022-07-10 DIAGNOSIS — E860 Dehydration: Secondary | ICD-10-CM

## 2022-07-10 DIAGNOSIS — Z66 Do not resuscitate: Secondary | ICD-10-CM | POA: Diagnosis present

## 2022-07-10 DIAGNOSIS — D179 Benign lipomatous neoplasm, unspecified: Secondary | ICD-10-CM | POA: Diagnosis not present

## 2022-07-10 DIAGNOSIS — F419 Anxiety disorder, unspecified: Secondary | ICD-10-CM | POA: Diagnosis present

## 2022-07-10 DIAGNOSIS — Z807 Family history of other malignant neoplasms of lymphoid, hematopoietic and related tissues: Secondary | ICD-10-CM

## 2022-07-10 DIAGNOSIS — R234 Changes in skin texture: Secondary | ICD-10-CM | POA: Diagnosis present

## 2022-07-10 DIAGNOSIS — M170 Bilateral primary osteoarthritis of knee: Secondary | ICD-10-CM | POA: Diagnosis present

## 2022-07-10 DIAGNOSIS — Z515 Encounter for palliative care: Principal | ICD-10-CM

## 2022-07-10 DIAGNOSIS — E538 Deficiency of other specified B group vitamins: Secondary | ICD-10-CM | POA: Diagnosis present

## 2022-07-10 DIAGNOSIS — Z7901 Long term (current) use of anticoagulants: Secondary | ICD-10-CM

## 2022-07-10 DIAGNOSIS — C7800 Secondary malignant neoplasm of unspecified lung: Secondary | ICD-10-CM | POA: Diagnosis not present

## 2022-07-10 DIAGNOSIS — R451 Restlessness and agitation: Secondary | ICD-10-CM | POA: Diagnosis present

## 2022-07-10 DIAGNOSIS — D509 Iron deficiency anemia, unspecified: Secondary | ICD-10-CM | POA: Diagnosis not present

## 2022-07-10 DIAGNOSIS — C8588 Other specified types of non-Hodgkin lymphoma, lymph nodes of multiple sites: Secondary | ICD-10-CM | POA: Diagnosis not present

## 2022-07-10 DIAGNOSIS — J449 Chronic obstructive pulmonary disease, unspecified: Secondary | ICD-10-CM | POA: Diagnosis present

## 2022-07-10 DIAGNOSIS — Z6832 Body mass index (BMI) 32.0-32.9, adult: Secondary | ICD-10-CM

## 2022-07-10 DIAGNOSIS — L270 Generalized skin eruption due to drugs and medicaments taken internally: Secondary | ICD-10-CM | POA: Diagnosis present

## 2022-07-10 DIAGNOSIS — R4589 Other symptoms and signs involving emotional state: Secondary | ICD-10-CM

## 2022-07-10 DIAGNOSIS — Z886 Allergy status to analgesic agent status: Secondary | ICD-10-CM

## 2022-07-10 DIAGNOSIS — N179 Acute kidney failure, unspecified: Secondary | ICD-10-CM | POA: Diagnosis not present

## 2022-07-10 DIAGNOSIS — I2722 Pulmonary hypertension due to left heart disease: Secondary | ICD-10-CM | POA: Diagnosis present

## 2022-07-10 DIAGNOSIS — Z7189 Other specified counseling: Secondary | ICD-10-CM

## 2022-07-10 DIAGNOSIS — G928 Other toxic encephalopathy: Secondary | ICD-10-CM | POA: Diagnosis present

## 2022-07-10 DIAGNOSIS — N1831 Chronic kidney disease, stage 3a: Secondary | ICD-10-CM | POA: Diagnosis present

## 2022-07-10 DIAGNOSIS — Z85819 Personal history of malignant neoplasm of unspecified site of lip, oral cavity, and pharynx: Secondary | ICD-10-CM

## 2022-07-10 DIAGNOSIS — Z800 Family history of malignant neoplasm of digestive organs: Secondary | ICD-10-CM

## 2022-07-10 DIAGNOSIS — I959 Hypotension, unspecified: Secondary | ICD-10-CM | POA: Diagnosis not present

## 2022-07-10 DIAGNOSIS — R52 Pain, unspecified: Secondary | ICD-10-CM | POA: Diagnosis present

## 2022-07-10 DIAGNOSIS — K769 Liver disease, unspecified: Secondary | ICD-10-CM | POA: Diagnosis present

## 2022-07-10 DIAGNOSIS — I4819 Other persistent atrial fibrillation: Secondary | ICD-10-CM | POA: Diagnosis present

## 2022-07-10 DIAGNOSIS — E669 Obesity, unspecified: Secondary | ICD-10-CM | POA: Diagnosis not present

## 2022-07-10 DIAGNOSIS — Z91041 Radiographic dye allergy status: Secondary | ICD-10-CM

## 2022-07-10 DIAGNOSIS — Z79890 Hormone replacement therapy (postmenopausal): Secondary | ICD-10-CM

## 2022-07-10 DIAGNOSIS — R195 Other fecal abnormalities: Secondary | ICD-10-CM | POA: Diagnosis present

## 2022-07-10 DIAGNOSIS — K5903 Drug induced constipation: Secondary | ICD-10-CM | POA: Diagnosis present

## 2022-07-10 DIAGNOSIS — Z9221 Personal history of antineoplastic chemotherapy: Secondary | ICD-10-CM

## 2022-07-10 DIAGNOSIS — G893 Neoplasm related pain (acute) (chronic): Secondary | ICD-10-CM | POA: Diagnosis present

## 2022-07-10 DIAGNOSIS — I358 Other nonrheumatic aortic valve disorders: Secondary | ICD-10-CM | POA: Diagnosis present

## 2022-07-10 DIAGNOSIS — T402X5A Adverse effect of other opioids, initial encounter: Secondary | ICD-10-CM | POA: Diagnosis present

## 2022-07-10 DIAGNOSIS — C8200 Follicular lymphoma grade I, unspecified site: Secondary | ICD-10-CM

## 2022-07-10 DIAGNOSIS — E861 Hypovolemia: Secondary | ICD-10-CM | POA: Diagnosis not present

## 2022-07-10 DIAGNOSIS — Z808 Family history of malignant neoplasm of other organs or systems: Secondary | ICD-10-CM

## 2022-07-10 DIAGNOSIS — K648 Other hemorrhoids: Secondary | ICD-10-CM | POA: Diagnosis present

## 2022-07-10 LAB — CBC WITH DIFFERENTIAL (CANCER CENTER ONLY)
Abs Immature Granulocytes: 0.03 10*3/uL (ref 0.00–0.07)
Basophils Absolute: 0 10*3/uL (ref 0.0–0.1)
Basophils Relative: 0 %
Eosinophils Absolute: 0.1 10*3/uL (ref 0.0–0.5)
Eosinophils Relative: 2 %
HCT: 37 % (ref 36.0–46.0)
Hemoglobin: 12.4 g/dL (ref 12.0–15.0)
Immature Granulocytes: 1 %
Lymphocytes Relative: 5 %
Lymphs Abs: 0.3 10*3/uL — ABNORMAL LOW (ref 0.7–4.0)
MCH: 30.2 pg (ref 26.0–34.0)
MCHC: 33.5 g/dL (ref 30.0–36.0)
MCV: 90 fL (ref 80.0–100.0)
Monocytes Absolute: 0.4 10*3/uL (ref 0.1–1.0)
Monocytes Relative: 8 %
Neutro Abs: 4.3 10*3/uL (ref 1.7–7.7)
Neutrophils Relative %: 84 %
Platelet Count: 113 10*3/uL — ABNORMAL LOW (ref 150–400)
RBC: 4.11 MIL/uL (ref 3.87–5.11)
RDW: 13 % (ref 11.5–15.5)
WBC Count: 5.2 10*3/uL (ref 4.0–10.5)
nRBC: 0 % (ref 0.0–0.2)

## 2022-07-10 LAB — CMP (CANCER CENTER ONLY)
ALT: 18 U/L (ref 0–44)
AST: 30 U/L (ref 15–41)
Albumin: 3.6 g/dL (ref 3.5–5.0)
Alkaline Phosphatase: 34 U/L — ABNORMAL LOW (ref 38–126)
Anion gap: 14 (ref 5–15)
BUN: 62 mg/dL — ABNORMAL HIGH (ref 8–23)
CO2: 24 mmol/L (ref 22–32)
Calcium: 10.6 mg/dL — ABNORMAL HIGH (ref 8.9–10.3)
Chloride: 96 mmol/L — ABNORMAL LOW (ref 98–111)
Creatinine: 3.09 mg/dL — ABNORMAL HIGH (ref 0.44–1.00)
GFR, Estimated: 14 mL/min — ABNORMAL LOW (ref >60)
Glucose, Bld: 135 mg/dL — ABNORMAL HIGH (ref 70–99)
Potassium: 4.4 mmol/L (ref 3.5–5.1)
Sodium: 134 mmol/L — ABNORMAL LOW (ref 135–145)
Total Bilirubin: 0.9 mg/dL (ref 0.3–1.2)
Total Protein: 5.2 g/dL — ABNORMAL LOW (ref 6.5–8.1)

## 2022-07-10 LAB — LACTATE DEHYDROGENASE: LDH: 387 U/L — ABNORMAL HIGH (ref 98–192)

## 2022-07-10 LAB — PHOSPHORUS: Phosphorus: 3.6 mg/dL (ref 2.5–4.6)

## 2022-07-10 LAB — URIC ACID: Uric Acid, Serum: 7.7 mg/dL — ABNORMAL HIGH (ref 2.5–7.1)

## 2022-07-10 LAB — MAGNESIUM: Magnesium: 2 mg/dL (ref 1.7–2.4)

## 2022-07-10 MED ORDER — AMIODARONE HCL 200 MG PO TABS: 100.0000 mg | ORAL_TABLET | Freq: Every day | ORAL | Status: DC

## 2022-07-10 MED ORDER — PREDNISONE 20 MG PO TABS: 20.0000 mg | ORAL_TABLET | Freq: Every day | ORAL | Status: DC

## 2022-07-10 MED ORDER — ONDANSETRON HCL 4 MG PO TABS: 4.0000 mg | ORAL_TABLET | Freq: Four times a day (QID) | ORAL | Status: DC | PRN

## 2022-07-10 MED ORDER — HYDROMORPHONE HCL 2 MG PO TABS
2.0000 mg | ORAL_TABLET | ORAL | Status: DC | PRN
  Administered 2022-07-10 – 2022-07-11 (×3): 2 mg via ORAL
  Filled 2022-07-10 (×3): qty 1

## 2022-07-10 MED ORDER — SODIUM CHLORIDE 0.9% FLUSH
10.0000 mL | Freq: Once | INTRAVENOUS | Status: AC
  Administered 2022-07-10: 10 mL via INTRAVENOUS

## 2022-07-10 MED ORDER — ACYCLOVIR 400 MG PO TABS
400.0000 mg | ORAL_TABLET | Freq: Two times a day (BID) | ORAL | Status: DC
  Administered 2022-07-10: 400 mg via ORAL
  Filled 2022-07-10: qty 1

## 2022-07-10 MED ORDER — ROSUVASTATIN CALCIUM 20 MG PO TABS
20.0000 mg | ORAL_TABLET | Freq: Every day | ORAL | Status: DC
  Administered 2022-07-10: 20 mg via ORAL
  Filled 2022-07-10: qty 1

## 2022-07-10 MED ORDER — ONDANSETRON HCL 4 MG/2ML IJ SOLN: 4.0000 mg | Freq: Four times a day (QID) | INTRAMUSCULAR | Status: DC | PRN

## 2022-07-10 MED ORDER — SODIUM CHLORIDE 0.9 % IV SOLN: INTRAVENOUS | Status: AC

## 2022-07-10 MED ORDER — APIXABAN 2.5 MG PO TABS
2.5000 mg | ORAL_TABLET | Freq: Two times a day (BID) | ORAL | Status: DC
  Administered 2022-07-10: 2.5 mg via ORAL
  Filled 2022-07-10: qty 1

## 2022-07-10 MED ORDER — HEPARIN SOD (PORK) LOCK FLUSH 100 UNIT/ML IV SOLN
500.0000 [IU] | Freq: Once | INTRAVENOUS | Status: AC
  Administered 2022-07-10: 250 [IU] via INTRAVENOUS

## 2022-07-10 MED ORDER — SENNOSIDES-DOCUSATE SODIUM 8.6-50 MG PO TABS: 1.0000 | ORAL_TABLET | Freq: Every evening | ORAL | Status: DC | PRN

## 2022-07-10 MED ORDER — LEVOTHYROXINE SODIUM 112 MCG PO TABS
112.0000 ug | ORAL_TABLET | Freq: Every day | ORAL | Status: DC
  Administered 2022-07-11: 112 ug via ORAL
  Filled 2022-07-10: qty 1

## 2022-07-10 MED ORDER — HYDROXYZINE PAMOATE 25 MG PO CAPS: 25.0000 mg | ORAL_CAPSULE | Freq: Every day | ORAL | Status: DC

## 2022-07-10 MED ORDER — HYDROXYZINE HCL 25 MG PO TABS
25.0000 mg | ORAL_TABLET | Freq: Every day | ORAL | Status: DC
  Administered 2022-07-10: 25 mg via ORAL
  Filled 2022-07-10: qty 1

## 2022-07-10 MED ORDER — NYSTATIN 100000 UNIT/GM EX POWD
Freq: Three times a day (TID) | CUTANEOUS | Status: DC
  Administered 2022-07-15: 1 via TOPICAL
  Filled 2022-07-10: qty 15

## 2022-07-10 MED ORDER — APIXABAN 5 MG PO TABS: 5.0000 mg | ORAL_TABLET | Freq: Two times a day (BID) | ORAL | Status: DC

## 2022-07-10 MED ORDER — PANTOPRAZOLE SODIUM 40 MG PO TBEC
40.0000 mg | DELAYED_RELEASE_TABLET | Freq: Every day | ORAL | Status: DC
  Administered 2022-07-10: 40 mg via ORAL
  Filled 2022-07-10: qty 1

## 2022-07-10 MED ORDER — ACETAMINOPHEN 325 MG PO TABS: 650.0000 mg | ORAL_TABLET | Freq: Four times a day (QID) | ORAL | Status: DC | PRN

## 2022-07-10 MED ORDER — SULFAMETHOXAZOLE-TRIMETHOPRIM 800-160 MG PO TABS: 1.0000 | ORAL_TABLET | ORAL | Status: DC

## 2022-07-10 MED ORDER — ALBUTEROL SULFATE (2.5 MG/3ML) 0.083% IN NEBU: 2.5000 mg | INHALATION_SOLUTION | RESPIRATORY_TRACT | Status: DC | PRN

## 2022-07-10 MED ORDER — POLYETHYLENE GLYCOL 3350 17 G PO PACK: 17.0000 g | PACK | Freq: Every day | ORAL | Status: DC | PRN

## 2022-07-10 MED ORDER — SODIUM CHLORIDE 0.9 % IV SOLN: INTRAVENOUS | Status: DC

## 2022-07-10 MED ORDER — ACETAMINOPHEN 650 MG RE SUPP: 650.0000 mg | Freq: Four times a day (QID) | RECTAL | Status: DC | PRN

## 2022-07-10 NOTE — Patient Instructions (Signed)

## 2022-07-10 NOTE — Progress Notes (Unsigned)
{  Select_TRH_Note:[id number]} 

## 2022-07-10 NOTE — Progress Notes (Signed)
[facility] OFFICE PROGRESS NOTE   Diagnosis: Non-Hodgkin's lymphoma  INTERVAL HISTORY:   Amanda Sellers returns for follow-up.  She completed cycle 1 polatuzumab/Rituxan 06/29/2022.  She was seen in the emergency department 07/06/2022 due to family concern for a CVA.  Brain CT showed no hemorrhage or CT evidence of an acute infarct.  Chemistry panel remarkable for creatinine 1.5, calcium 11.4.  She received IV fluids and was discharged home feeling better.  She is accompanied to today's visit by her daughter.  She reports Amanda Sellers's condition has declined significantly over the weekend.  She is weak.  Poor oral intake.  She requires 3 people to assist her to get to the bathroom.  She is confused.  No fever.  She reports right arm/breast pain is better.  Amanda Sellers complains of significant rectal pain.  She has been constipated which family attributes to Dilaudid.  She began a stool softener and developed diarrhea with black stool over the past 2 days.  Her daughter reports a rash beneath the right breast.  They have been putting an antifungal powder on the rash.  Daughter reports oxygen saturation in the low 80s without O2.    Objective:  Vital signs in last 24 hours:  Blood pressure (!) 109/90, pulse 95, temperature 98.1 F (36.7 C), temperature source Oral, resp. rate 18, height 5\' 6"  (1.676 m), weight 198 lb (89.8 kg), SpO2 98 %.    HEENT: Tongue appears dry.  She would not open her mouth completely. Lymphatics: Right axilla feels less firm. Resp: Lungs clear bilaterally. Cardio: Irregular. GI: No hepatosplenomegaly.  Rectal exam with no mass, no significant stool in the rectal vault.  Scant amount of brown stool on glove tested positive for occult blood. Vascular: Trace lower leg edema bilaterally.  Right arm no longer appears edematous. Neuro: Wakes up with gentle stimulus.  Follows some commands.  Answer some questions. Skin: Ecchymoses scattered over all  extremities. Breast: Right breast is edematous; erythema is less.  Yeast appearing rash beneath the right breast. Left upper extremity PICC site is without erythema.  Lab Results:  Lab Results  Component Value Date   WBC 5.2 07/10/2022   HGB 12.4 07/10/2022   HCT 37.0 07/10/2022   MCV 90.0 07/10/2022   PLT 113 (L) 07/10/2022   NEUTROABS 4.3 07/10/2022    Imaging:  No results found.  Medications: I have reviewed the patient's current medications.  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 Severe microcytic anemia, status post transfusion with packed red blood cells 07/23/2014   Normal ferritin, low transferrin saturation, "scant" bone marrow iron stores, review of peripheral blood smear consistent with iron deficiency anemia   colonoscopy and upper endoscopy 09/23/2014 3. Exertional dyspnea /orthopnea-most likely secondary to congestive heart failure   4. History of B-12 deficiency   5. Rash over the trunk and extremities 08/18/2014. Appears consistent with a drug rash. Resolved 08/28/2014. Rash 11/18/2014, likely secondary to a contrast allergy, diagnostic contrast will be listed as an allergy 6. Colonoscopy 06/03/2008. Medium sized internal hemorrhoids.   7. Admission with atrial flutter with a  rapid ventricular response 10/15/2014-status post cardioversion 10/16/2014, maintained on      apixaban Repeat cardioversion 11/30/2014   8. Right olecranon skin lesion-likely a lipoma or cyst 9. 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  Large right axillary mass 11/24/2021 CT with numerous bulky matted appearing right axillary lymph nodes, largest discretely measurable node measuring 4.6 x 4.3 cm.  Severe edema of the right breast and right chest wall.  No lymphadenopathy or metastatic disease in the abdomen or pelvis.   Bilateral mammogram and ultrasound right breast 11/29/2021-suspicious mass possibly a replaced lymph node right axilla measuring 4.3 cm.  At least 1 additional abnormal lymph node with cortical thickening identified in the right axilla.  Diffuse right breast skin thickening and increased trabeculation.   Biopsy right axillary node 11/29/2021-high-grade B-cell lymphoma with proliferation rate approaching 100%,  29% of the cells kappa restricted CD10 positive B cells Cycle 1 CEOP-Rituxan 12/06/2021 Cycle 2 CEOP-Rituxan 12/27/2021, Udenyca Cycle 3 CEOP-Rituxan 01/18/2022, Udenyca Cycle 4 CEOP-Rituxan 02/08/2022, Udenyca Restaging chest CT 02/23/2022-right axilla is incompletely imaged but there appears to have been substantial decrease in size of bulky right axillary and subpectoral lymphadenopathy.  Matted soft tissue remains in this vicinity without discretely measurable lymph nodes.  No other enlarged mediastinal, hilar or axillary lymph nodes.  Interval resolution of previously seen soft tissue edema involving the right breast and chest wall. Cycle 5 CEOP-Rituxan 03/14/2022 Cycle 6 CEOP-Rituxan 04/04/2022 CTs 06/25/2022-significant interval progression of disease.  Bulky right axillary lymphadenopathy markedly increased and potentially muscle invasive into the right pectoralis muscles.  New right internal mammary lymphadenopathy.  Several new indistinct solid pulmonary nodules scattered throughout the right greater than left lungs.  At least 3 new indistinct  hypodense liver lesions.  Asymmetric right breast enlargement, right breast skin thickening and right breast parenchymal density.  Increased subcutaneous edema throughout the lateral right abdominal wall. Cycle 1 polatuzumab/Rituxan 06/29/2022   11.  Left lower extremity cellulitis 12/21/2021.  Improved since beginning doxycycline 12/19/2021.  14-day course planned.  Disposition: Amanda Sellers has relapsed high-grade B cell non-Hodgkin's lymphoma.  She completed cycle 1 polatuzumab/Rituxan 06/29/2022.  She presents today for routine follow-up.  She is very weak, appears dehydrated.  Stool is positive for occult blood.    We reviewed the labs from today.  Hemoglobin is likely falsely elevated due to significant dehydration.  Creatinine is high at 3.  Calcium is mildly elevated. We are initiating IV fluids in the office.  Will need to follow the calcium level.  The firm fullness in the right axilla, right arm edema and right breast erythema have improved.  Right breast remains significantly edematous.  We are making arrangements for hospital admission.  Patient seen with Dr. Truett Perna.  Lonna Cobb ANP/GNP-BC   07/10/2022  2:18 PM  This was a shared visit with Lonna Cobb.  Amanda Sellers was interviewed and examined.  She is now at day 12 following cycle 1 polatuzumab/rituximab given for treatment of recurrent large cell lymphoma.  She presents today with failure to thrive dehydration.  The stool is dark and Hemoccult positive.  She will be admitted for intravenous hydration and supportive care measures.  There is no obvious evidence for progression of lymphoma on exam today.  We will consider a restaging chest CT during this admission.  The calcium is mildly elevated and could be contributing to altered mental status and dehydration.  We will check a calcium level daily and consider administering biphosphonate therapy in addition to  venous hydration.  I will see her in the a.m. on 07/11/2022.  I was  present for greater than 50% of today's visit.  I performed medical decision making.  Mancel Bale, MD

## 2022-07-10 NOTE — Progress Notes (Unsigned)
@  1608: Transported via [facility] to [facility] in stable condition. Report provided to paramedic. IV continued on d/c.

## 2022-07-10 NOTE — H&P (Signed)
History and Physical  Amanda Sellers WUJ:[id number] DOB: 01-Feb-1932 DOA: 07/10/2022  PCP: Jeoffrey Massed, MD   Chief Complaint: Weakness  HPI: Amanda Sellers is a 89 y.o. female with medical history significant for left high-grade B cell non-Hodgkin's lymphoma who recently completed cycle 1 of polatuzumab/Rituxan on 6/6 and is being admitted to the hospital with weakness, dehydration and acute renal failure.  History is provided by her 2 daughters were at the bedside this evening after she was directly admitted from the office of her oncologist Dr. Alcide Evener.  Per the patient's daughters, she had this first round of new chemotherapy on 06/29/2022, she was then seen in the emergency department on 6/13 due to family concern for CVA, workup was negative.  She had slightly elevated creatinine of 1.5, received IV fluids and was discharged home feeling better with her family.  Over, since that time she has continued to decline, she has developed worsening weakness, intermittent confusion, there has not been any cough, shortness of breath, fevers or chills.  Patient has also been constipated since she was transitioned to oral Dilaudid for pain control, family is concerned that after taking stool softeners she has had some dark loose stools.  She was seen in the oncology clinic today, where labs were repeated and she has worsening renal failure as mentioned below.  Rectal exam was done, without significant stool burden, but dark stool was seen, which was heme positive.  She was sent to the hospital for direct admission to the hospitalist service.  Review of Systems: Please see HPI for pertinent positives and negatives. A complete 10 system review of systems are otherwise negative.  Past Medical History:  Diagnosis Date   Anemia of chronic disease    Anticoagulation adequate, Eliquis 10/16/2014   Anxiety    clonaz helps this AND her breathing   Cholelithiasis without cholecystitis 07/2020   noted on CT  abd/pelv at [facility]   Chronic combined systolic and diastolic congestive heart failure (HCC) 10/16/2014   Complicated by atrial fibrillation; Echo 07/23/14: mild LVH, EF 50-55%, Gr 2 DD.;  Echo May 15, 2019: EF 35 to 40%, GRII DD.  (In setting of CHF exacerbation, class III)-> baseline class II symptoms. 09/2019 EF 55-60%, nl wall motion and LV fxn, grd II DD, mod MR.   Chronic renal insufficiency, stage III (moderate) (HCC)    GFR @ 50   COPD (chronic obstructive pulmonary disease) (HCC)    recently noted on Xray, does not have any problems   Diverticulosis    Follicular lymphoma (HCC)    2016 Non Hodgkins B cell lymphoma; s/p chemo summer 2016.  Remission until R axillary recurrence 11/2021-->chemo   Hiatal hernia 07/2020   moderate size-noted on CT abd/pelv at [facility] hosp   HLD (hyperlipidemia)     06/2014   HTN (hypertension)    Hypothyroidism    IFG (impaired fasting glucose)    Lip cancer    Dr. Jearld Fenton excised this: invasive SCC--no sign of cancer at ENT f/u 10/2015   Lymphedema    R>>L   Microcytic anemia    transfused 3 U total in hosp 07/2014   Obesity (BMI 30-39.9)    Pancytopenia due to antineoplastic chemotherapy (HCC)    Persistent atrial fibrillation (HCC)    With RVR; elec cardioversion 11/30/14.  On Amio since 10/2014; Dr. Kriste Basque following her from pulm standpoint regarding this med.   Positive occult stool blood test 08/08/2014   Endoscopies ok 08/2014  Primary osteoarthritis of both knees 04/2016   Dr. Darrelyn Hillock: bone on bone--to get euflexxa to both knees.   Protein calorie malnutrition (HCC) 2022   Pulmonary hypertension due to left heart disease (HCC)    RLCP 05/19/2019: EF 35-40%. D1 50%, RI 70%, dLM-LAD 30%.  mPAP 43 mmHg - (62/19 mmHg), PCWP  28 mmHg (V wave 41 mmHg) LVEDP 34 mmHg   Pulmonary metastases 07/24/2014   S/p left hip fracture 05/2020   ORIF   Venous stasis ulcer (HCC)    Right leg   Vitamin B 12 deficiency    Vitamin D  deficiency    dose increased to 50 K U tab TWICE weekly 04/2017   Past Surgical History:  Procedure Laterality Date   ABDOMINAL HYSTERECTOMY  1978   BONE MARROW BIOPSY  07/24/2014   BREAST BIOPSY     CARDIOVERSION N/A 10/16/2014   Procedure: CARDIOVERSION;  Surgeon: Lewayne Bunting, MD;  Location: [facility] ENDOSCOPY;  Service: Cardiovascular;  Laterality: N/A;   CARDIOVERSION N/A 11/30/2014   Procedure: CARDIOVERSION;  Surgeon: Laurey Morale, MD;  Location: [facility] ENDOSCOPY;  Service: Cardiovascular;  Laterality: N/A;   CATARACT EXTRACTION  2020   OU: Dr. Dione Booze   COLONOSCOPY  09/23/2014   small hemorrhoids, otherwise normal (performed for IDA and heme+ stool)   COLONOSCOPY     FRACTURE SURGERY     HIP FRACTURE SURGERY     May 2022   INTRAMEDULLARY (IM) NAIL INTERTROCHANTERIC Left 05/25/2020   Procedure: INTRAMEDULLARY (IM) NAIL INTERTROCHANTRIC;  Surgeon: Terance Hart, MD;  Location: [facility] OR;  Service: Orthopedics;  Laterality: Left;   MASS EXCISION Left 07/15/2015   Left lower lip mass--invasive SCC w/negative margins.  Procedure: EXCISION MASS;  Surgeon: Suzanna Obey, MD;  Location: [facility] OR;  Service: ENT;  Laterality: Left;  Wedge excision left lower lip mass   NM MYOVIEW LTD  10/29/2014   medium size mild surgery defect in the mid anterior and apical anterior location suggestive of breast attenuation. No reversibility. LOW RISK   PFTs  02/2015   Restriction with diffusion defect: cardiologist referred her to pulm to help interpret PFTs and decide whether she has amiodarone toxicity   RIGHT/LEFT HEART CATH AND CORONARY ANGIOGRAPHY N/A 05/19/2019   Procedure: RIGHT/LEFT HEART CATH AND CORONARY ANGIOGRAPHY;  Surgeon: Lennette Bihari, MD;  Location: [facility] INVASIVE CV LAB;; EF 35-40%. D1 50%, RI 70%, dLM-LAD 30%.  mPAP 43 mmHg - (62/19 mmHg), PCWP  28 mmHg (V wave 41 mmHg) LVEDP 34 mmHg   TEE WITHOUT CARDIOVERSION N/A 10/16/2014   Procedure: TRANSESOPHAGEAL ECHOCARDIOGRAM (TEE);  Surgeon: Lewayne Bunting, MD;  Location: [facility] ENDOSCOPY;  Service: Cardiovascular;  Laterality: N/A;   TRANSTHORACIC ECHOCARDIOGRAM  10/09/2019   EF 55 to 60%.  GRII DD.  No R WMA.  Normal PAP and RAP.  Severe LA dilation.  Mild RA dilation.  Moderate MR (likely related to annular dilation in the setting of severe LAE.  Mild aortic valve sclerosis-no stenosis.   TRANSTHORACIC ECHOCARDIOGRAM  05/15/2019   Acute CHF exacerbation:  EF 35-40%.  Moderately reduced function.  (Previous EF was 55 to 60%) global HK.  GRII DD.  Mild LA dilation.  Normal RV size.   TRANSTHORACIC ECHOCARDIOGRAM     02/2022 EF 55%, no regional WMA, MOD TO SEVERE MR   UPPER GI ENDOSCOPY  09/23/2014   small hiatus hernia, nodules in stomach biopsied (chronic active erosive atrophic gastritis with intestinal metaplasia--no dysplasia or malignancy) otherwise normal  Social History:  reports that she has never smoked. She has never used smokeless tobacco. She reports that she does not drink alcohol and does not use drugs.   Allergies  Allergen Reactions   Aspirin Itching   Calcium Alginate Other (See Comments)    Excessive burning   Morphine     Other reaction(s): Hallucinations   Iodinated Contrast Media Rash and Other (See Comments)    CT Contrast    Family History  Problem Relation Age of Onset   Melanoma Mother    Stomach cancer Father    Lymphoma Sister    Diabetes Daughter      Prior to Admission medications   Medication Sig Start Date End Date Taking? Authorizing Provider  acyclovir (ZOVIRAX) 400 MG tablet Take 1 tablet (400 mg total) by mouth 2 (two) times daily. 06/27/22  Yes Ladene Artist, MD  amiodarone (PACERONE) 200 MG tablet Take 0.5 tablets (100 mg total) by mouth daily. 09/05/21  Yes Newman Nip, NP  cyanocobalamin (,VITAMIN B-12,) 1000 MCG/ML injection INJECT 1 ML INTO THE SKIN EVERY 30 DAYS. Patient taking differently: Inject 1,000 mcg into the muscle every 30 (thirty) days. INJECT 1 ML INTO THE SKIN  EVERY 30 DAYS. 07/27/20  Yes McGowen, Maryjean Morn, MD  diclofenac Sodium (VOLTAREN) 1 % GEL APPLY TOPICALLY 4 TIMES DAILY TO JOINTS THAT ARE IN PAIN. 04/27/22  Yes McGowen, Maryjean Morn, MD  docusate sodium (COLACE) 100 MG capsule Take 100 mg by mouth every other day.   Yes [provider]  ELIQUIS 5 MG TABS tablet TAKE 1 TABLET BY MOUTH TWICE A DAY 05/31/22  Yes Marykay Lex, MD  ENTRESTO 24-26 MG TAKE 1 TABLET BY MOUTH TWICE A DAY 03/28/22  Yes Marykay Lex, MD  fish oil-omega-3 fatty acids 1000 MG capsule Take 1 g by mouth daily.   Yes [provider]  fluticasone (CUTIVATE) 0.05 % cream Apply to affected area of lower legs twice daily as needed. 04/27/22  Yes McGowen, Maryjean Morn, MD  furosemide (LASIX) 40 MG tablet Taking 1 tablet by mouth daily and 1/2 tablet in the afternoon 05/17/22  Yes McGowen, Maryjean Morn, MD  HYDROmorphone (DILAUDID) 2 MG tablet Take 1 tablet (2 mg total) by mouth every 4 (four) hours as needed for severe pain. 06/30/22  Yes Rana Snare, NP  hydrOXYzine (VISTARIL) 25 MG capsule TAKE 1 TO 2 CAPSULES BY MOUTH AT BEDTIME FOR SLEEP 02/01/22  Yes McGowen, Maryjean Morn, MD  levothyroxine (SYNTHROID) 112 MCG tablet TAKE 1 TABLET BY MOUTH EVERY DAY 11/23/21  Yes McGowen, Maryjean Morn, MD  omeprazole (PRILOSEC) 20 MG capsule Take 1 capsule (20 mg total) by mouth daily. 12/27/21  Yes McGowen, Maryjean Morn, MD  OXYGEN Inhale into the lungs. 1Lt at bedtime  as needed   Yes [provider]  Potassium Chloride ER 20 MEQ TBCR Take 1 tablet by mouth daily. 09/28/21  Yes McGowen, Maryjean Morn, MD  predniSONE (DELTASONE) 20 MG tablet Take 3 tablets (60 mg total) by mouth daily with breakfast. Patient taking differently: Take 20 mg by mouth daily with breakfast. 06/23/22  Yes Rana Snare, NP  prochlorperazine (COMPAZINE) 5 MG tablet Take 1 tablet (5 mg total) by mouth every 6 (six) hours as needed. 06/13/22  Yes Ladene Artist, MD  rosuvastatin (CRESTOR) 20 MG tablet TAKE 1 TABLET BY MOUTH EVERY  DAY 01/25/22  Yes Marykay Lex, MD  sulfamethoxazole-trimethoprim (BACTRIM DS) 800-160 MG tablet Take 1  tablet by mouth 3 (three) times a week. 06/28/22  Yes Ladene Artist, MD  Vitamin D, Ergocalciferol, (DRISDOL) 1.25 MG (50000 UNIT) CAPS capsule TAKE 1 CAPSULE (50,000 UNITS TOTAL) BY MOUTH TWO TIMES A WEEK 02/09/22  Yes McGowen, Maryjean Morn, MD    Physical Exam: BP 124/67 (BP Location: Right Arm)   Pulse 83   Temp 98.6 F (37 C) (Oral)   Resp (!) 22   Ht 5\' 6"  (1.676 m)   Wt 91.5 kg   SpO2 98%   BMI 32.56 kg/m   General: Very tired appearing elderly female who appears her stated age, resting in no acute distress in her room on the sixth floor.  2 daughters and a granddaughter are at the bedside.  Patient is easily arousable, but appears to be disoriented. Eyes: EOMI, clear conjuctivae, white sclerea Neck: supple, no masses, trachea mildline  Cardiovascular: Irregularly irregular, normal heart rate, no murmurs or rubs, no peripheral edema  Respiratory: clear to auscultation bilaterally, no wheezes, no crackles  Abdomen: soft, nontender, nondistended, normal bowel tones heard  Skin: dry, no rashes; patient's right axilla and right lateral breast were examined with the assistance of her daughter, and with the patient's permission.  There is some induration, though it is nontender, no fluctuant masses, no significant overlying erythema. Musculoskeletal: no joint effusions, normal range of motion  Psychiatric: appropriate affect, normal speech  Neurologic: extraocular muscles intact, clear speech, moving all extremities with intact sensorium          Labs on Admission:  Basic Metabolic Panel: Recent Labs  Lab 07/06/22 1331 07/06/22 1553 07/10/22 1228 07/10/22 1250  NA 135 134* 134*  --   K 4.7 4.5 4.4  --   CL 92* 92* 96*  --   CO2 28 29 24   --   GLUCOSE 180* 163* 135*  --   BUN 49* 47* 62*  --   CREATININE 1.58* 1.50* 3.09*  --   CALCIUM 11.4* 10.9* 10.6*  --   MG  --   --    --  2.0  PHOS 3.6  --   --  3.6   Liver Function Tests: Recent Labs  Lab 07/06/22 1331 07/10/22 1228  AST 24 30  ALT 16 18  ALKPHOS 42 34*  BILITOT 1.0 0.9  PROT 5.9* 5.2*  ALBUMIN 4.0 3.6   No results for input(s): "LIPASE", "AMYLASE" in the last 168 hours. No results for input(s): "AMMONIA" in the last 168 hours. CBC: Recent Labs  Lab 07/06/22 1331 07/06/22 1553 07/10/22 1228  WBC 6.6 5.6 5.2  NEUTROABS 6.0  --  4.3  HGB 13.6 13.3 12.4  HCT 40.5 38.4 37.0  MCV 88.8 87.9 90.0  PLT 116* 106* 113*   Cardiac Enzymes: No results for input(s): "CKTOTAL", "CKMB", "CKMBINDEX", "TROPONINI" in the last 168 hours.  BNP (last 3 results) No results for input(s): "BNP" in the last 8760 hours.  ProBNP (last 3 results) No results for input(s): "PROBNP" in the last 8760 hours.  CBG: Recent Labs  Lab 07/06/22 1554  GLUCAP 166*   Radiological Exams on Admission: No results found.  Assessment/Plan This is an unfortunate 89 year old female with a history of relapsed high-grade B cell non-Hodgkin's lymphoma admitted to the hospital with failure to thrive since her first cycle of polatuzumab/Rituxan on 6/6, complicated by dehydration, intermittent confusion, weakness, and acute renal failure.  Failure to thrive with dehydration and acute renal failure-likely due to difficulty tolerating her chemotherapy -Observation admission -Encourage  p.o. intake -IV fluids -Avoid nephrotoxins, trend renal function -Obtain renal ultrasound if not improving  Recurrent diffuse B cell non-Hodgkin lymphoma-Dr. Alcide Evener added to treatment team -Pain control with p.o. Dilaudid as needed -Continue Stool softener regimen -Continue scheduled prednisone 20 mg p.o. daily, with prophylactic Bactrim  Heme positive stool-family denies seeing any bright red blood, patient is hemodynamically stable, not hypotensive, not anemic (though this could be spurious in the setting of dehydration), so for now we  will continue Eliquis.  Will discontinue in the case of any bright red bleeding or other evidence of active bleed.  Discussed with the patient's daughters, and they are in agreement.  Hyperlipidemia-Crestor 20 mg p.o. daily  Hypothyroid-Synthroid 112 mcg p.o. daily  GERD-Protonix  Chronic atrial fibrillation-currently rate controlled -Telemetry monitoring -Continue amiodarone -Continue Eliquis, renally dosed  Chronic congestive heart failure with preserved EF-currently no obvious evidence of acute heart failure, hold Entresto due to acute renal failure as above.   DVT prophylaxis: Eliquis    Code Status: Full Code-I did discuss goals of care as well as CODE STATUS with the patient's daughter at the bedside this evening.  They state that patient has always been a full code, however today she has made comments about probably not going back for any further chemotherapy infusions, and that she is ready to discontinue treatments.  Have placed palliative care consult.  Consults called: Palliative care, oncology Dr. Alcide Evener added to treatment team  Admission status: Observation  Time spent: 65 minutes  Amadi Frady Sharlette Dense MD [facility] Pager [phone]  If 7PM-7AM, please contact night-coverage [email or web address] Password [email or web address]  07/10/2022, 7:17 PM

## 2022-07-10 NOTE — Telephone Encounter (Signed)
The patient's daughter contacted [place] to inform that there has been a decline in the patient's condition since Thursday. The patient is experiencing difficulty standing, visual disturbances, discoloration in the breast area, and an increase in breast size. Additionally, the patient has had a reduced appetite, only consuming toast with butter milk, and a minimal amount of water over the weekend.

## 2022-07-11 DIAGNOSIS — J449 Chronic obstructive pulmonary disease, unspecified: Secondary | ICD-10-CM | POA: Diagnosis not present

## 2022-07-11 DIAGNOSIS — C8588 Other specified types of non-Hodgkin lymphoma, lymph nodes of multiple sites: Secondary | ICD-10-CM | POA: Diagnosis not present

## 2022-07-11 DIAGNOSIS — D509 Iron deficiency anemia, unspecified: Secondary | ICD-10-CM | POA: Diagnosis not present

## 2022-07-11 DIAGNOSIS — E669 Obesity, unspecified: Secondary | ICD-10-CM | POA: Diagnosis not present

## 2022-07-11 DIAGNOSIS — D179 Benign lipomatous neoplasm, unspecified: Secondary | ICD-10-CM | POA: Diagnosis not present

## 2022-07-11 DIAGNOSIS — G928 Other toxic encephalopathy: Secondary | ICD-10-CM | POA: Diagnosis not present

## 2022-07-11 DIAGNOSIS — Z7189 Other specified counseling: Secondary | ICD-10-CM | POA: Diagnosis not present

## 2022-07-11 DIAGNOSIS — I2722 Pulmonary hypertension due to left heart disease: Secondary | ICD-10-CM | POA: Diagnosis not present

## 2022-07-11 DIAGNOSIS — G893 Neoplasm related pain (acute) (chronic): Secondary | ICD-10-CM | POA: Diagnosis not present

## 2022-07-11 DIAGNOSIS — N179 Acute kidney failure, unspecified: Secondary | ICD-10-CM | POA: Diagnosis not present

## 2022-07-11 DIAGNOSIS — I358 Other nonrheumatic aortic valve disorders: Secondary | ICD-10-CM | POA: Diagnosis not present

## 2022-07-11 DIAGNOSIS — R52 Pain, unspecified: Secondary | ICD-10-CM | POA: Diagnosis not present

## 2022-07-11 DIAGNOSIS — I959 Hypotension, unspecified: Secondary | ICD-10-CM | POA: Diagnosis not present

## 2022-07-11 DIAGNOSIS — D631 Anemia in chronic kidney disease: Secondary | ICD-10-CM | POA: Diagnosis not present

## 2022-07-11 DIAGNOSIS — I5042 Chronic combined systolic (congestive) and diastolic (congestive) heart failure: Secondary | ICD-10-CM | POA: Diagnosis not present

## 2022-07-11 DIAGNOSIS — Z79899 Other long term (current) drug therapy: Secondary | ICD-10-CM | POA: Diagnosis not present

## 2022-07-11 DIAGNOSIS — F419 Anxiety disorder, unspecified: Secondary | ICD-10-CM | POA: Diagnosis not present

## 2022-07-11 DIAGNOSIS — C8510 Unspecified B-cell lymphoma, unspecified site: Secondary | ICD-10-CM

## 2022-07-11 DIAGNOSIS — I4819 Other persistent atrial fibrillation: Secondary | ICD-10-CM | POA: Diagnosis not present

## 2022-07-11 DIAGNOSIS — R627 Adult failure to thrive: Secondary | ICD-10-CM | POA: Diagnosis not present

## 2022-07-11 DIAGNOSIS — Z66 Do not resuscitate: Secondary | ICD-10-CM | POA: Diagnosis not present

## 2022-07-11 DIAGNOSIS — R4589 Other symptoms and signs involving emotional state: Secondary | ICD-10-CM | POA: Diagnosis not present

## 2022-07-11 DIAGNOSIS — C7800 Secondary malignant neoplasm of unspecified lung: Secondary | ICD-10-CM | POA: Diagnosis not present

## 2022-07-11 DIAGNOSIS — Z515 Encounter for palliative care: Secondary | ICD-10-CM

## 2022-07-11 DIAGNOSIS — E860 Dehydration: Secondary | ICD-10-CM | POA: Diagnosis not present

## 2022-07-11 DIAGNOSIS — E861 Hypovolemia: Secondary | ICD-10-CM | POA: Diagnosis not present

## 2022-07-11 DIAGNOSIS — K769 Liver disease, unspecified: Secondary | ICD-10-CM | POA: Diagnosis not present

## 2022-07-11 DIAGNOSIS — N1831 Chronic kidney disease, stage 3a: Secondary | ICD-10-CM | POA: Diagnosis not present

## 2022-07-11 DIAGNOSIS — I130 Hypertensive heart and chronic kidney disease with heart failure and stage 1 through stage 4 chronic kidney disease, or unspecified chronic kidney disease: Secondary | ICD-10-CM | POA: Diagnosis not present

## 2022-07-11 DIAGNOSIS — E039 Hypothyroidism, unspecified: Secondary | ICD-10-CM | POA: Diagnosis not present

## 2022-07-11 LAB — COMPREHENSIVE METABOLIC PANEL
ALT: 23 U/L (ref 0–44)
AST: 36 U/L (ref 15–41)
Albumin: 2.9 g/dL — ABNORMAL LOW (ref 3.5–5.0)
Alkaline Phosphatase: 32 U/L — ABNORMAL LOW (ref 38–126)
Anion gap: 9 (ref 5–15)
BUN: 63 mg/dL — ABNORMAL HIGH (ref 8–23)
CO2: 23 mmol/L (ref 22–32)
Calcium: 9.7 mg/dL (ref 8.9–10.3)
Chloride: 100 mmol/L (ref 98–111)
Creatinine, Ser: 3.12 mg/dL — ABNORMAL HIGH (ref 0.44–1.00)
GFR, Estimated: 14 mL/min — ABNORMAL LOW (ref >60)
Glucose, Bld: 90 mg/dL (ref 70–99)
Potassium: 3.9 mmol/L (ref 3.5–5.1)
Sodium: 132 mmol/L — ABNORMAL LOW (ref 135–145)
Total Bilirubin: 1 mg/dL (ref 0.3–1.2)
Total Protein: 4.9 g/dL — ABNORMAL LOW (ref 6.5–8.1)

## 2022-07-11 LAB — CBC
HCT: 34.9 % — ABNORMAL LOW (ref 36.0–46.0)
Hemoglobin: 11.4 g/dL — ABNORMAL LOW (ref 12.0–15.0)
MCH: 30.2 pg (ref 26.0–34.0)
MCHC: 32.7 g/dL (ref 30.0–36.0)
MCV: 92.6 fL (ref 80.0–100.0)
Platelets: 77 10*3/uL — ABNORMAL LOW (ref 150–400)
RBC: 3.77 MIL/uL — ABNORMAL LOW (ref 3.87–5.11)
RDW: 13 % (ref 11.5–15.5)
WBC: 3 10*3/uL — ABNORMAL LOW (ref 4.0–10.5)
nRBC: 0 % (ref 0.0–0.2)

## 2022-07-11 MED ORDER — HALOPERIDOL LACTATE 5 MG/ML IJ SOLN: 0.5000 mg | INTRAMUSCULAR | Status: DC | PRN

## 2022-07-11 MED ORDER — HALOPERIDOL 0.5 MG PO TABS: 0.5000 mg | ORAL_TABLET | ORAL | Status: DC | PRN

## 2022-07-11 MED ORDER — GLYCOPYRROLATE 0.2 MG/ML IJ SOLN: 0.2000 mg | INTRAMUSCULAR | Status: DC | PRN

## 2022-07-11 MED ORDER — GLYCOPYRROLATE 0.2 MG/ML IJ SOLN
0.2000 mg | INTRAMUSCULAR | Status: DC | PRN
  Administered 2022-07-14 – 2022-07-17 (×8): 0.2 mg via INTRAVENOUS
  Filled 2022-07-11 (×10): qty 1

## 2022-07-11 MED ORDER — ACETAMINOPHEN 650 MG RE SUPP: 650.0000 mg | Freq: Four times a day (QID) | RECTAL | Status: DC | PRN

## 2022-07-11 MED ORDER — BIOTENE DRY MOUTH MT LIQD: 15.0000 mL | OROMUCOSAL | Status: DC | PRN

## 2022-07-11 MED ORDER — METHYLPREDNISOLONE SODIUM SUCC 40 MG IJ SOLR
40.0000 mg | Freq: Every day | INTRAMUSCULAR | Status: DC
  Administered 2022-07-11 – 2022-07-12 (×2): 40 mg via INTRAVENOUS
  Filled 2022-07-11 (×2): qty 1

## 2022-07-11 MED ORDER — HALOPERIDOL LACTATE 2 MG/ML PO CONC: 0.5000 mg | ORAL | Status: DC | PRN

## 2022-07-11 MED ORDER — LORAZEPAM 2 MG/ML IJ SOLN
1.0000 mg | INTRAMUSCULAR | Status: DC | PRN
  Administered 2022-07-13 – 2022-07-14 (×4): 2 mg via INTRAVENOUS
  Filled 2022-07-11 (×4): qty 1

## 2022-07-11 MED ORDER — HYDROMORPHONE HCL 1 MG/ML IJ SOLN
1.0000 mg | INTRAMUSCULAR | Status: DC | PRN
  Administered 2022-07-11 – 2022-07-17 (×18): 1 mg via INTRAVENOUS
  Filled 2022-07-11 (×20): qty 1

## 2022-07-11 MED ORDER — GLYCOPYRROLATE 1 MG PO TABS: 1.0000 mg | ORAL_TABLET | ORAL | Status: DC | PRN

## 2022-07-11 MED ORDER — OXYCODONE HCL ER 15 MG PO T12A
15.0000 mg | EXTENDED_RELEASE_TABLET | Freq: Two times a day (BID) | ORAL | Status: DC
  Administered 2022-07-11: 15 mg via ORAL
  Filled 2022-07-11: qty 1

## 2022-07-11 MED ORDER — FENTANYL 12 MCG/HR TD PT72
1.0000 | MEDICATED_PATCH | TRANSDERMAL | Status: DC
  Administered 2022-07-12 – 2022-07-15 (×2): 1 via TRANSDERMAL
  Filled 2022-07-11 (×2): qty 1

## 2022-07-11 MED ORDER — ACETAMINOPHEN 325 MG PO TABS: 650.0000 mg | ORAL_TABLET | Freq: Four times a day (QID) | ORAL | Status: DC | PRN

## 2022-07-11 MED ORDER — POLYVINYL ALCOHOL 1.4 % OP SOLN: 1.0000 [drp] | Freq: Four times a day (QID) | OPHTHALMIC | Status: DC | PRN

## 2022-07-11 NOTE — Consult Note (Cosign Needed Addendum)
Palliative Care Consult Note                                  Date: 07/11/2022   Patient Name: Amanda Sellers  DOB: March 26, 1932  MRN: [id number]  Age / Sex: 89 y.o., female  PCP: Jeoffrey Massed, MD Referring Physician: Azucena Fallen, MD  Reason for Consultation: goals of care, symptom management  HPI/Patient Profile: 89 y.o. female  with past medical history of left high-grade B cell non-Hodgkin's lymphoma, anemia, COPD, persistent atrial fibrillation on coumadin, ,  who recently completed cycle 1 of chemotherapy on 06/29/22 and did not tolerate it well per family.  She was admitted on 07/10/2022 with weakness, dehydration, and acute kidney injury.   Palliative Medicine has been consulted for goals of care in the setting of aggressive lymphoma.    Subjective:   I have reviewed medical records including progress notes, labs and imaging. Per Dr. Vira Blanco discussion with daughter earlier today, code status was changed to DNR and comfort measures were initiated.  Bedside visit. Patient appears comfortable and does not wake up at any time during my visit. No non-verbal signs of pain or discomfort noted. Respirations are even and unlabored. No excessive respiratory secretions noted.   I introduced myself and the role of Palliative Medicine.  Multiple family members are present at bedside - 4 daughters and 2 granddaughters. Family shares that patient has 6 daughters and 1 living son. Her other son unexpectedly passed away last year.   We discussed patient's current illness and what it means in the larger context of his/her ongoing co-morbidities. Current clinical status was reviewed.  Discussed natural trajectory at end-of-life.  Created space and opportunity for family to explore thoughts and feelings regarding current medical situation. Values and goals of care important to patient and family were attempted to be elicited.  I  confirmed with family that goal of care is for comfort measures only.  Reviewed that comfort care means foregoing fullscope medical interventions and allowing a natural course to occur.  Discussed that the goal is comfort and dignity, rather than prolonging life.  We discussed disposition options - home with hospice versus inpatient hospice facility.  We also discussed the option to remain in the hospital in the event end-of-life becomes more imminent.   _____________________________________________________________________________  Outside the room, I am informed by RN that bladder scan is showing no urine however unclear if this is accurate due to patient's body habitus.  I have placed an order for Foley catheter EOL comfort in the event patient is retaining urine.  I returned to room to discuss with family and they agree with placement of Foley catheter.  Discussed the possibility that patient may not be producing urine, which is not unexpected at end-of-life.  After further discussion with family, it is determined that home with hospice is not an option. We will reassess possibility for transfer to hospice facility tomorrow pending patient's clinical condition overnight.    Review of Systems  Unable to perform ROS   Objective:   Primary Diagnoses: Present on Admission:  AKI (acute kidney injury) (HCC)  Uncontrolled pain   Physical Exam Vitals reviewed.  Constitutional:      General: She is not in acute distress.    Appearance: She is ill-appearing.  Pulmonary:     Effort: No respiratory distress.  Neurological:     Comments: Does not awaken to voice  Vital Signs:  BP (!) 63/42 (BP Location: Right Arm)   Pulse (!) 104   Temp 99.2 F (37.3 C) (Axillary)   Resp 17   Ht 5\' 6"  (1.676 m)   Wt 91.5 kg   SpO2 98%   BMI 32.56 kg/m   Palliative Assessment/Data: PPS 10%     Assessment & Plan:   SUMMARY OF RECOMMENDATIONS   Continue comfort focused care Plan to  reassess for disposition options tomorrow  Place foley catheter PRN medications are available for symptom management PMT will follow-up tomorrow  Symptom Management:  Dilaudid prn for pain or dyspnea Lorazepam (ATIVAN) prn for anxiety Haloperidol (HALDOL) prn for agitation  Glycopyrrolate (ROBINUL) for excessive secretions Ondansetron (ZOFRAN) prn for nausea Polyvinyl alcohol (LIQUIFILM TEARS) prn for dry eyes Antiseptic oral rinse (BIOTENE) prn for dry mouth   Primary Decision Maker: NEXT OF KIN - patient's daughters  Code Status/Advance Care Planning: DNR  Prognosis:  < 2 weeks  Discharge Planning:  To Be Determined    Thank you for allowing [place] to participate in the care of Amanda Sellers  MDM - High   Signed by: Sherlean Foot, NP Palliative Medicine Team  Team Phone # [phone]  For individual providers, please see AMION

## 2022-07-11 NOTE — Progress Notes (Signed)
PT Cancellation Note  Patient Details Name: Amanda Sellers MRN: [id number] DOB: Sep 20, 1932   Cancelled Treatment:    Reason Eval/Treat Not Completed: Medical issues which prohibited therapy including pt pain and lethargy. POA and daughter,  [name] at bed side and reported that pt is transitioning to comfort care, pt has been non-ambulatory for 2+ years, at [facility] 24/7 family S and A and no further PT services are warranted at this time.   Johnny Bridge, PT Acute Rehab    Jacqualyn Posey 07/11/2022, 10:44 AM

## 2022-07-11 NOTE — Progress Notes (Addendum)
PROGRESS NOTE    Amanda Sellers  GMW:[id number] DOB: Aug 19, 1932 DOA: 07/10/2022 PCP: Jeoffrey Massed, MD   Brief Narrative:  Amanda Sellers is a 89 y.o. female with medical history significant for left high-grade B cell non-Hodgkin's lymphoma who recently completed cycle 1 of polatuzumab/Rituxan on 6/6 and did not tolerate it well per family, she is currently  being admitted to the hospital with weakness, dehydration and acute renal failure.   Assessment & Plan:   Principal Problem:   AKI (acute kidney injury) (HCC)   Goals of care -Lengthy discussion today with daughter at bedside, she used to volunteer with hospice so she is well aware of patient's condition. -Given patient's recurrence of lymphoma as above with no real course of treatment palliative or otherwise we discussed patient's quality of life. -Somewhat acutely over the past week patient's pain has become more severe and diffuse, essentially bedbound -Patient has discussed with family if she had a incurable disease/poor prognosis she would not want to be intubated, have CPR, or have any aggressive or heroic measures performed. -Given patient's previous wishes per family she will be transitioned to DNR and comfort measures will initiate.  Discontinue all unnecessary p.o. antibiotics discontinue labs, vitals, telemetry, etc. -Intractable diffuse pain poorly controlled this morning, add scheduled oxycodone ER, add IV Dilaudid.  Patient may benefit from PCA pump in the near future if she cannot be controlled on p.o. medications at discharge.  AKI, likely prerenal  -Secondary to failure to thrive with dehydration  -Complicated by intolerance to new chemotherapy, unclear if chemotherapy is itself the cause of AKI or simply contributing due to poor p.o. intake. -Continue this current IV saline but discontinue thereafter to avoid volume overload   Acute metabolic encephalopathy, multifactorial  -Patient initially mildly  hypercalcemic (resolved, mentation has not improved -BUN/creatinine also elevated which could be playing a role in patient's mental status -Patient course unfortunately complicated as she continues on narcotics in the setting of intractable pain as we transition to comfort measures as above.  Recurrent diffuse B cell non-Hodgkin lymphoma -Oncology following, appreciate Dr. Kalman Drape guidance in this difficult case -Aggressive pain control given patient's severe uncontrolled pain this morning -Add IV Dilaudid, p.o. oxycodone scheduled as above -Transition steroids to IV in hopes for some symptom management as patient has difficulty tolerating p.o. prednisone   Heme positive stool -Dark stool noted previously, family denies any bright red blood per rectum  -Hemoglobin previously stable no indication for transfusion -No longer following labs given comfort measures as above -Discontinue   Hyperlipidemia - Discontinue statin given comfort measures  Hypothyroid-Synthroid 112 mcg p.o. daily GERD-Protonix Chronic atrial fibrillation-currently rate controlled -Discontinue telemetry amiodarone and Eliquis as above. -Treat symptomatically, if uncontrolled symptomatic tachycardia IV calcium channel blocker may be appropriate   Chronic congestive heart failure with preserved EF -Appears somewhat hypovolemic, continue IV fluids, discontinue Entresto  -Urine output remains poor, will complete current IV fluids and reevaluate  DVT prophylaxis: Held due to questionable GI bleed, SCDs not placed for comfort Code Status: DNR, comfort measures.  Confirmed with daughter at bedside Family Communication: Daughter at bedside, lengthy discussion today about CODE STATUS as above  Status is: Inpatient  Dispo: The patient is from: Home              Anticipated d/c is to: Home with hospice              Anticipated d/c date is: 24 to 48 hours pending clinical course  Patient currently not  medically stable for discharge given ongoing uncontrolled pain.  Once stabilized discharge home with hospice is certainly reasonable  Consultants:  Palliative care, oncology Dr. Truett Perna  Procedures:  None  Antimicrobials:  None  Subjective: No acute issues or events overnight, pain continues to be poorly controlled despite increased p.o. Dilaudid.  Transition to IV Dilaudid and p.o. scheduled oxycodone ER.  Review of systems markedly limited as patient continues to call out in pain, somewhat difficult to discern her speech.  Objective: Vitals:   07/10/22 1727 07/10/22 2100 07/11/22 0100 07/11/22 0500  BP:   (!) 98/57 126/89  Pulse:  (!) 104 92 66  Resp:  16 16 17   Temp:  98.7 F (37.1 C) 98.6 F (37 C) 98.2 F (36.8 C)  TempSrc:  Oral Oral Oral  SpO2:      Weight: 91.5 kg     Height: 5\' 6"  (1.676 m)      No intake or output data in the 24 hours ending 07/11/22 0732 Filed Weights   07/10/22 1727  Weight: 91.5 kg    Examination:  General: Moderate distress, tachycardic diaphoretic calling out in pain HEENT:  Normocephalic atraumatic.  Sclerae nonicteric, noninjected. Neck:  Without mass or deformity.  Trachea is midline. Lungs: Coarse bilateral breath sounds without overt wheeze or rales. Heart: Tachycardic but regular without overt murmurs rubs or gallops. Abdomen: Diffusely tender right flank peers to be PMI Extremities: Without clubbing edema or obvious deformity Skin:  Warm and dry, no erythema  Data Reviewed: I have personally reviewed following labs and imaging studies  CBC: Recent Labs  Lab 07/06/22 1331 07/06/22 1553 07/10/22 1228 07/11/22 0610  WBC 6.6 5.6 5.2 3.0*  NEUTROABS 6.0  --  4.3  --   HGB 13.6 13.3 12.4 11.4*  HCT 40.5 38.4 37.0 34.9*  MCV 88.8 87.9 90.0 92.6  PLT 116* 106* 113* 77*   Basic Metabolic Panel: Recent Labs  Lab 07/06/22 1331 07/06/22 1553 07/10/22 1228 07/10/22 1250 07/11/22 0610  NA 135 134* 134*  --  132*  K 4.7  4.5 4.4  --  3.9  CL 92* 92* 96*  --  100  CO2 28 29 24   --  23  GLUCOSE 180* 163* 135*  --  90  BUN 49* 47* 62*  --  63*  CREATININE 1.58* 1.50* 3.09*  --  3.12*  CALCIUM 11.4* 10.9* 10.6*  --  9.7  MG  --   --   --  2.0  --   PHOS 3.6  --   --  3.6  --    GFR: Estimated Creatinine Clearance: 14.5 mL/min (A) (by C-G formula based on SCr of 3.12 mg/dL (H)).  Liver Function Tests: Recent Labs  Lab 07/06/22 1331 07/10/22 1228 07/11/22 0610  AST 24 30 36  ALT 16 18 23   ALKPHOS 42 34* 32*  BILITOT 1.0 0.9 1.0  PROT 5.9* 5.2* 4.9*  ALBUMIN 4.0 3.6 2.9*   CBG: Recent Labs  Lab 07/06/22 1554  GLUCAP 166*    Radiology Studies: No results found.  Scheduled Meds:  acyclovir  400 mg Oral BID   amiodarone  100 mg Oral Daily   apixaban  2.5 mg Oral BID   hydrOXYzine  25 mg Oral QHS   levothyroxine  112 mcg Oral Daily   nystatin   Topical TID   pantoprazole  40 mg Oral Daily   predniSONE  20 mg Oral Q breakfast   rosuvastatin  20 mg Oral Daily   [START ON 07/12/2022] sulfamethoxazole-trimethoprim  1 tablet Oral Once per day on Mon Wed Fri   Continuous Infusions:  sodium chloride 75 mL/hr at 07/11/22 0431     LOS: 1 day   Time spent:  Azucena Fallen, DO [facility]  If 7PM-7AM, please contact night-coverage [email or web address]  07/11/2022, 7:32 AM

## 2022-07-11 NOTE — Care Plan (Signed)
  PROGRESS NOTE    Amanda Sellers  ZOX:[id number] DOB: Feb 22, 1932 DOA: 07/10/2022 PCP: Jeoffrey Massed, MD   Brief Narrative:  -Lengthy discussion today with daughter at bedside, she used to volunteer with hospice so she is well aware of patient's condition. -Given patient's recurrence of lymphoma as above with no real course of treatment palliative or otherwise we discussed patient's quality of life. -Somewhat acutely over the past week patient's pain has become more severe and diffuse, essentially bedbound -Patient has discussed with family if she had a incurable disease/poor prognosis she would not want to be intubated, have CPR, or have any aggressive or heroic measures performed. -Given patient's previous wishes per family she will be transitioned to DNR and comfort measures will initiate.  Discontinue all unnecessary p.o. antibiotics discontinue labs, vitals, telemetry, etc. -Intractable diffuse pain poorly controlled this morning, add scheduled oxycodone ER, add IV Dilaudid.  Patient may benefit from PCA pump in the near future if she cannot be controlled on p.o. medications at discharge.   Azucena Fallen, DO [facility]  If 7PM-7AM, please contact night-coverage [email or web address]  07/11/2022, 11:40 AM

## 2022-07-11 NOTE — Progress Notes (Signed)
IP PROGRESS NOTE  Subjective:   Amanda. Casaletto was admitted yesterday with failure to thrive.  She is lethargic this morning after receiving pain medication.  Her daughter is at the bedside.  She has minimal urine output overnight.  Objective: Vital signs in last 24 hours: Blood pressure 126/89, pulse 66, temperature 98.2 F (36.8 C), temperature source Oral, resp. rate 17, height 5\' 6"  (1.676 m), weight 201 lb 11.5 oz (91.5 kg), SpO2 98 %.  Intake/Output from previous day: No intake/output data recorded.  Physical Exam:  HEENT: No thrush over the tongue Lungs: Clear anteriorly, no respiratory distress Cardiac: Irregular Abdomen: Nontender, no hepatosplenomegaly Extremities: No leg edema Skin: Multiple ecchymoses over the legs Breast:: The right breast is edematous with mild erythema.  Portacath/PICC-without erythema  Lab Results: Recent Labs    07/10/22 1228  WBC 5.2  HGB 12.4  HCT 37.0  PLT 113*    BMET Recent Labs    07/10/22 1228  NA 134*  K 4.4  CL 96*  CO2 24  GLUCOSE 135*  BUN 62*  CREATININE 3.09*  CALCIUM 10.6*    Lab Results  Component Value Date   CEA 0.8 07/23/2014     Medications: I have reviewed the patient's current medications.  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 Severe microcytic anemia, status post transfusion with packed red blood cells  07/23/2014   Normal ferritin, low transferrin saturation, "scant" bone marrow iron stores, review of peripheral blood smear consistent with iron deficiency anemia   colonoscopy and upper endoscopy 09/23/2014 3. Exertional dyspnea /orthopnea-most likely secondary to congestive heart failure   4. History of B-12 deficiency   5. Rash over the trunk and extremities 08/18/2014. Appears consistent with a drug rash. Resolved 08/28/2014. Rash 11/18/2014, likely secondary to a contrast allergy, diagnostic contrast will be listed as an allergy 6. Colonoscopy 06/03/2008. Medium sized internal hemorrhoids.   7. Admission with atrial flutter with a rapid ventricular response 10/15/2014-status post cardioversion 10/16/2014, maintained on     apixaban Repeat cardioversion 11/30/2014   8. Right olecranon skin lesion-likely a lipoma or cyst 9. 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  Large right axillary mass 11/24/2021 CT with numerous bulky matted appearing right axillary lymph nodes, largest discretely measurable node measuring 4.6 x 4.3 cm.  Severe edema of the right breast and right chest wall.  No lymphadenopathy or metastatic disease in the abdomen or pelvis.   Bilateral mammogram and ultrasound right breast 11/29/2021-suspicious mass possibly a replaced lymph node right axilla measuring 4.3 cm.  At least 1 additional abnormal lymph node with cortical thickening identified in the right axilla.  Diffuse right breast skin thickening and increased trabeculation.   Biopsy right axillary node 11/29/2021-high-grade B-cell lymphoma with proliferation rate approaching 100%,  29% of the cells kappa restricted CD10 positive B cells Cycle 1 CEOP-Rituxan 12/06/2021 Cycle 2 CEOP-Rituxan 12/27/2021, Udenyca Cycle 3 CEOP-Rituxan 01/18/2022, Udenyca Cycle 4 CEOP-Rituxan 02/08/2022, Udenyca Restaging chest CT 02/23/2022-right axilla is incompletely imaged but there appears to have been substantial  decrease in size of bulky right axillary and subpectoral  lymphadenopathy.  Matted soft tissue remains in this vicinity without discretely measurable lymph nodes.  No other enlarged mediastinal, hilar or axillary lymph nodes.  Interval resolution of previously seen soft tissue edema involving the right breast and chest wall. Cycle 5 CEOP-Rituxan 03/14/2022 Cycle 6 CEOP-Rituxan 04/04/2022 CTs 06/25/2022-significant interval progression of disease.  Bulky right axillary lymphadenopathy markedly increased and potentially muscle invasive into the right pectoralis muscles.  New right internal mammary lymphadenopathy.  Several new indistinct solid pulmonary nodules scattered throughout the right greater than left lungs.  At least 3 new indistinct hypodense liver lesions.  Asymmetric right breast enlargement, right breast skin thickening and right breast parenchymal density.  Increased subcutaneous edema throughout the lateral right abdominal wall. Cycle 1 polatuzumab/Rituxan 06/29/2022   11.  Left lower extremity cellulitis 12/21/2021.  Improved since beginning doxycycline 12/19/2021.  14-day course planned. 12.  Admission 07/10/2022 with failure to thrive, renal failure, hypercalcemia, altered mental status  Amanda Sellers has non-Hodgkin's lymphoma.  She is now at day 13 following cycle 1 salvage therapy with polatuzumab/rituximab.  Is admitted with failure to thrive.  The creatinine remains elevated following intravenous hydration overnight.   I discussed the poor prognosis with her daughters.  They report Amanda. Murchison would like to be on a no CODE BLUE status.  It is possible the altered mental status and renal failure are related to hypercalcemia.  She may improve with biphosphonate therapy and continued intravenous hydration.    I will discuss the case with Dr. Natale Milch.  LOS: 1 day   Thornton Papas, MD   07/11/2022, 6:39 AM

## 2022-07-12 ENCOUNTER — Encounter: Payer: Self-pay | Admitting: Oncology

## 2022-07-12 DIAGNOSIS — Z515 Encounter for palliative care: Secondary | ICD-10-CM | POA: Diagnosis not present

## 2022-07-12 DIAGNOSIS — N179 Acute kidney failure, unspecified: Secondary | ICD-10-CM | POA: Diagnosis not present

## 2022-07-12 DIAGNOSIS — C8510 Unspecified B-cell lymphoma, unspecified site: Secondary | ICD-10-CM | POA: Diagnosis not present

## 2022-07-12 DIAGNOSIS — G893 Neoplasm related pain (acute) (chronic): Secondary | ICD-10-CM

## 2022-07-12 NOTE — Progress Notes (Signed)
PROGRESS NOTE    Amanda Sellers  YQM:[id number] DOB: 11/16/1932 DOA: 07/10/2022 PCP: Jeoffrey Massed, MD   Brief Narrative:  89 y.o. female with medical history significant for left high-grade B cell non-Hodgkin's lymphoma who recently completed cycle 1 of polatuzumab/Rituxan on 6/6 and did not tolerate it well per family, she is currently  being admitted to the hospital with weakness, dehydration and acute renal failure.  At this point patient's symptoms are significantly advanced with failure to thrive lethargic.  Seen by oncology, patient has very poor prognosis.  Transition to comfort care.   Assessment & Plan:  Principal Problem:   AKI (acute kidney injury) (HCC) Active Problems:   Uncontrolled pain    Acute metabolic encephalopathy, toxic Recurrent diffuse B cell non-Hodgkin's lymphoma Acute kidney injury Adult failure to thrive Heme positive stools Chronic congestive heart failure with preserved ejection fraction Hyperlipidemia Chronic atrial fibrillation Hypothyroidism GERD  At this time patient has been transitioned to comfort care.  Patient is extremely poor prognosis.  Currently remains in the hospital due to intractable pain.  Palliative care team is following.  Daughter present at bedside  DVT prophylaxis: None Code Status: DNR Continue hospital stay until pain is under better control       Diet Orders (From admission, onward)     Start     Ordered   07/10/22 1917  Diet regular Room service appropriate? Yes; Fluid consistency: Thin  Diet effective now       Question Answer Comment  Room service appropriate? Yes   Fluid consistency: Thin      07/10/22 1916            Subjective: Patient sitting comfortably, does not appear to be any distress.  Daughter is present at bedside as well.   Examination:  Patient does not appear to be in any distress at this time.  Resting comfortably.  Unable to assess neuro or psychiatry exam at this  time.  Objective: Vitals:   07/10/22 2100 07/11/22 0100 07/11/22 0500 07/11/22 1318  BP:  (!) 98/57 126/89 (!) 63/42  Pulse: (!) 104 92 66 (!) 104  Resp: 16 16 17    Temp: 98.7 F (37.1 C) 98.6 F (37 C) 98.2 F (36.8 C) 99.2 F (37.3 C)  TempSrc: Oral Oral Oral Axillary  SpO2:    98%  Weight:      Height:        Intake/Output Summary (Last 24 hours) at 07/12/2022 0758 Last data filed at 07/11/2022 1621 Gross per 24 hour  Intake 573.75 ml  Output 20 ml  Net 553.75 ml   Filed Weights   07/10/22 1727  Weight: 91.5 kg    Scheduled Meds:  fentaNYL  1 patch Transdermal Q72H   methylPREDNISolone (SOLU-MEDROL) injection  40 mg Intravenous Daily   nystatin   Topical TID   Continuous Infusions:  Nutritional status     Body mass index is 32.56 kg/m.  Data Reviewed:   CBC: Recent Labs  Lab 07/06/22 1331 07/06/22 1553 07/10/22 1228 07/11/22 0610  WBC 6.6 5.6 5.2 3.0*  NEUTROABS 6.0  --  4.3  --   HGB 13.6 13.3 12.4 11.4*  HCT 40.5 38.4 37.0 34.9*  MCV 88.8 87.9 90.0 92.6  PLT 116* 106* 113* 77*   Basic Metabolic Panel: Recent Labs  Lab 07/06/22 1331 07/06/22 1553 07/10/22 1228 07/10/22 1250 07/11/22 0610  NA 135 134* 134*  --  132*  K 4.7 4.5 4.4  --  3.9  CL 92* 92* 96*  --  100  CO2 28 29 24   --  23  GLUCOSE 180* 163* 135*  --  90  BUN 49* 47* 62*  --  63*  CREATININE 1.58* 1.50* 3.09*  --  3.12*  CALCIUM 11.4* 10.9* 10.6*  --  9.7  MG  --   --   --  2.0  --   PHOS 3.6  --   --  3.6  --    GFR: Estimated Creatinine Clearance: 14.5 mL/min (A) (by C-G formula based on SCr of 3.12 mg/dL (H)). Liver Function Tests: Recent Labs  Lab 07/06/22 1331 07/10/22 1228 07/11/22 0610  AST 24 30 36  ALT 16 18 23   ALKPHOS 42 34* 32*  BILITOT 1.0 0.9 1.0  PROT 5.9* 5.2* 4.9*  ALBUMIN 4.0 3.6 2.9*   No results for input(s): "LIPASE", "AMYLASE" in the last 168 hours. No results for input(s): "AMMONIA" in the last 168 hours. Coagulation Profile: No  results for input(s): "INR", "PROTIME" in the last 168 hours. Cardiac Enzymes: No results for input(s): "CKTOTAL", "CKMB", "CKMBINDEX", "TROPONINI" in the last 168 hours. BNP (last 3 results) No results for input(s): "PROBNP" in the last 8760 hours. HbA1C: No results for input(s): "HGBA1C" in the last 72 hours. CBG: Recent Labs  Lab 07/06/22 1554  GLUCAP 166*   Lipid Profile: No results for input(s): "CHOL", "HDL", "LDLCALC", "TRIG", "CHOLHDL", "LDLDIRECT" in the last 72 hours. Thyroid Function Tests: No results for input(s): "TSH", "T4TOTAL", "FREET4", "T3FREE", "THYROIDAB" in the last 72 hours. Anemia Panel: No results for input(s): "VITAMINB12", "FOLATE", "FERRITIN", "TIBC", "IRON", "RETICCTPCT" in the last 72 hours. Sepsis Labs: No results for input(s): "PROCALCITON", "LATICACIDVEN" in the last 168 hours.  No results found for this or any previous visit (from the past 240 hour(s)).       Radiology Studies: No results found.         LOS: 2 days   Time spent= 35 mins    Hartman Minahan Joline Maxcy, MD [facility]  If 7PM-7AM, please contact night-coverage  07/12/2022, 7:58 AM

## 2022-07-12 NOTE — TOC Initial Note (Signed)
Transition of Care [facility]) - Initial/Assessment Note    Patient Details  Name: Amanda Sellers MRN: [id number] Date of Birth: December 18, 1932  Transition of Care [facility]) CM/SW Contact:    Beckie Busing, RN Phone Number:[phone]  07/12/2022, 3:38 PM  Clinical Narrative:                 TOC following patient with high risk for readmission. Patient has currently been transitioned to comfort care. Palliative consulted for goals of care. Palliative to reevaluate to reassess for possible transition to residential hospice. TOC will continue to follow for disposition needs.  Expected Discharge Plan: Hospice Medical Facility (End of life care) Barriers to Discharge: Continued Medical Work up (pain management)   Patient Goals and CMS Choice Patient states their goals for this hospitalization and ongoing recovery are:: Patient unable to verbalize CMS Medicare.gov Compare Post Acute Care list provided to::  (n/a) Choice offered to / list presented to : NA [place] ownership interest in [facility].provided to::  (n/a)    Expected Discharge Plan and Services In-house Referral: Hospice / Palliative Care, Chaplain Discharge Planning Services: CM Consult Post Acute Care Choice: NA Living arrangements for the past 2 months: Single Family Home                 DME Arranged: N/A DME Agency: NA       HH Arranged: NA HH Agency: NA        Prior Living Arrangements/Services Living arrangements for the past 2 months: Single Family Home Lives with:: Self Patient language and need for interpreter reviewed:: Yes        Need for Family Participation in Patient Care: Yes (Comment) Care giver support system in place?: Yes (comment) Current home services:  (n/a) Criminal Activity/Legal Involvement Pertinent to Current Situation/Hospitalization: No - Comment as needed  Activities of Daily Living Home Assistive Devices/Equipment: Oxygen, Wheelchair, Bedside commode/3-in-1, Walker (specify  type), Other (Comment), Hearing aid (sara steady) ADL Screening (condition at time of admission) Patient's cognitive ability adequate to safely complete daily activities?: No Is the patient deaf or have difficulty hearing?: Yes Does the patient have difficulty seeing, even when wearing glasses/contacts?: No Does the patient have difficulty concentrating, remembering, or making decisions?: Yes Patient able to express need for assistance with ADLs?: Yes Does the patient have difficulty dressing or bathing?: Yes Independently performs ADLs?: No Communication: Independent Dressing (OT): Needs assistance Is this a change from baseline?: Pre-admission baseline Grooming: Needs assistance Is this a change from baseline?: Pre-admission baseline Feeding: Needs assistance Is this a change from baseline?: Change from baseline, expected to last <3 days Bathing: Needs assistance Is this a change from baseline?: Pre-admission baseline Toileting: Needs assistance Is this a change from baseline?: Pre-admission baseline In/Out Bed: Needs assistance Is this a change from baseline?: Pre-admission baseline Walks in Home: Needs assistance Is this a change from baseline?: Pre-admission baseline Does the patient have difficulty walking or climbing stairs?: Yes Weakness of Legs: Both Weakness of Arms/Hands: Both  Permission Sought/Granted                  Emotional Assessment Appearance:: Appears stated age Attitude/Demeanor/Rapport: Unable to Assess (not responding enough to assess) Affect (typically observed): Unable to Assess (not responding enough to assess)   Alcohol / Substance Use: Not Applicable Psych Involvement: No (comment)  Admission diagnosis:  AKI (acute kidney injury) (HCC) [N17.9] Uncontrolled pain [R52] Patient Active Problem List   Diagnosis Date Noted   Uncontrolled  pain 07/11/2022   AKI (acute kidney injury) (HCC) 07/10/2022   B-cell lymphoma of lymph nodes of axilla  (HCC) 12/02/2021   Valvular heart disease 08/22/2020   Peripheral edema 08/22/2020   LBBB (left bundle branch block) 05/25/2020   Fracture, proximal femur, left, closed, initial encounter (HCC) 05/25/2020   Restrictive lung disease 10/01/2019   Hypothyroidism 05/21/2019   Pulmonary hypertension due to left heart disease (HCC)    Coronary artery disease involving native coronary artery of native heart without angina pectoris    Dilated cardiomyopathy (HCC)    Stomatitis 08/14/2016   Osteoarthritis 05/08/2016   Pernicious anemia 06/29/2015   Abnormal finding on pulmonary function testing 05/10/2015   History of pernicious anemia 05/10/2015   Other iron deficiency anemias 05/10/2015   On amiodarone therapy 02/11/2015   Anticoagulation adequate, Eliquis 10/16/2014   Paroxysmal atrial fibrillation (HCC); CHA2DS2Vasc 6 - Eliquis; Amio 100 mg daily 10/15/2014   Microcytic anemia 08/09/2014   Positive occult stool blood test 08/08/2014   Symptomatic anemia 08/07/2014   Acute on chronic diastolic CHF (congestive heart failure), NYHA class 3 (HCC) 08/07/2014   Pancytopenia due to antineoplastic chemotherapy (HCC) 08/07/2014   Follicular lymphoma (HCC) 07/29/2014   Lymphadenopathy    Anemia 07/22/2014   Dyspnea 07/22/2014   Chronic diastolic CHF (congestive heart failure) (HCC) 07/22/2014   Essential hypertension 07/22/2014   Hyperlipidemia 07/22/2014   Osteoporosis 03/01/2006   PCP:  Jeoffrey Massed, MD Pharmacy:   [facility] [place] [facility] [address], [place] - [phone] [place] [address] Phone: [phone] Fax: [phone]  [facility] [address] [place] [address] Phone: [phone] Fax: [phone]  [phone] [place],  - [address] [place] [address] Phone: [phone] Fax: [phone]     Social Determinants of Health  (SDOH) Social History: SDOH Screenings   Food Insecurity: No Food Insecurity (07/10/2022)  Housing: Low Risk  (07/10/2022)  Transportation Needs: No Transportation Needs (07/10/2022)  Utilities: Not At Risk (07/10/2022)  Depression (PHQ2-9): Medium Risk (05/17/2022)  Financial Resource Strain: Low Risk  (01/05/2021)  Physical Activity: Insufficiently Active (01/05/2021)  Social Connections: Moderately Isolated (01/05/2021)  Stress: No Stress Concern Present (01/05/2021)  Tobacco Use: Low Risk  (07/10/2022)   SDOH Interventions:     Readmission Risk Interventions    07/12/2022    3:25 PM  Readmission Risk Prevention Plan  Transportation Screening Complete  PCP or Specialist Appt within 3-5 Days Complete  HRI or Home Care Consult Complete  Social Work Consult for Recovery Care Planning/Counseling Complete  Palliative Care Screening Complete  Medication Review Oceanographer) Complete

## 2022-07-12 NOTE — Progress Notes (Signed)
Palliative Medicine Progress Note   Patient Name: Amanda Sellers ZOXWRUE       Date: 07/12/2022 DOB: 04-28-1932  Age: 89 y.o. MRN#: [id number] Attending Physician: Dimple Nanas, MD Primary Care Physician: Jeoffrey Massed, MD Admit Date: 07/10/2022  Reason for Consultation/Follow-up: end of life care   HPI/Patient Profile: 89 y.o. female  with past medical history of left high-grade B cell non-Hodgkin's lymphoma, anemia, COPD, persistent atrial fibrillation on Eliquis.  She recently completed cycle 1 of chemotherapy on 06/29/22 and did not tolerate it well per family.  She was admitted on 07/10/2022 with weakness, dehydration, and acute kidney injury.   Palliative Medicine has been consulted for goals of care in the setting of aggressive lymphoma.    Subjective: Chart update received from RN.  She reports that patient has seemed comfortable with current PRN medications.  She also reports prior shift was unable to place Foley due to tissue swelling.  Bedside visit. Patient is resting with her eyes closed and appears comfortable.  Respirations are even and unlabored. No excessive respiratory secretions noted.   I spoke with family outside the room (daughters, son, and granddaughter). Reviewed trajectory at EOL. Family wants patient to remain in the hospital for EOL, and I agreed this is appropriate. Family expresses concern that transfer would cause her to experience pain and distress. Emotional support provided.    Objective:  Physical Exam Vitals reviewed.  Constitutional:      General: She is sleeping. She is not in acute distress.    Appearance: She is ill-appearing.  Pulmonary:     Effort: Pulmonary effort is normal.             Vital Signs: BP 104/60 (BP Location: Right Wrist)    Pulse 99   Temp 99.2 F (37.3 C) (Axillary)   Resp 17   Ht 5\' 6"  (1.676 m)   Wt 91.5 kg   SpO2 98%   BMI 32.56 kg/m  SpO2: SpO2:  (pt refused) O2 Device: O2 Device: Nasal Cannula O2 Flow Rate: O2 Flow Rate (L/min): 2 L/min    Palliative Medicine Assessment & Plan   Assessment: Principal Problem:   AKI (acute kidney injury) (HCC) Active Problems:   Uncontrolled pain    Recommendations/Plan: Continue comfort care PRN medications are available for symptom management Anticipate hospital death  PMT will continue to support  Symptom Management:  Dilaudid prn for pain or dyspnea Lorazepam (ATIVAN) prn for anxiety Haloperidol (HALDOL) prn for agitation  Glycopyrrolate (ROBINUL) for excessive secretions Ondansetron (ZOFRAN) prn for nausea Polyvinyl alcohol (LIQUIFILM TEARS) prn for dry eyes Antiseptic oral rinse (BIOTENE) prn for dry mouth  Code Status: DNR   Prognosis:  Hours - Days  Discharge Planning: Anticipated Hospital Death   Thank you for allowing the Palliative Medicine Team to assist in the care of this patient.   MDM - High   Merry Proud, NP   Please contact Palliative Medicine Team phone at [phone] for questions and concerns.  For individual providers, please see AMION.

## 2022-07-13 ENCOUNTER — Inpatient Hospital Stay: Payer: PPO

## 2022-07-13 DIAGNOSIS — N179 Acute kidney failure, unspecified: Secondary | ICD-10-CM | POA: Diagnosis not present

## 2022-07-13 DIAGNOSIS — Z7189 Other specified counseling: Secondary | ICD-10-CM | POA: Diagnosis not present

## 2022-07-13 DIAGNOSIS — Z515 Encounter for palliative care: Secondary | ICD-10-CM | POA: Diagnosis not present

## 2022-07-13 NOTE — Progress Notes (Signed)
Palliative:  HPI: 89 y.o. female  with past medical history of left high-grade B cell non-Hodgkin's lymphoma, anemia, COPD, persistent atrial fibrillation on Eliquis.  She recently completed cycle 1 of chemotherapy on 06/29/22 and did not tolerate it well per family.  She was admitted on 07/10/2022 with weakness, dehydration, and acute kidney injury. Palliative Medicine has been consulted for goals of care in the setting of aggressive lymphoma. Decision made for comfort care 6/18.   I discussed with Dr. Nelson Chimes. I met with 2 daughters, brother, and multiple family members at bedside. Amanda Sellers is resting comfortably at time of my visit. I discussed with family her status. She has been mostly unresponsive, non-verbal. She does not respond to anyone during my visit. Reviewed measures or comfort with oxygen as well as water drops/moisture to mouth. Discussed with family at bedside urine output although this is minimal and signs of kidney failure with lethargy and decreased responsiveness. Family expresses relief that she is no longer in pain as she was suffering. I reinforced that this is something we should be able to improve. Unfortunately in medicine we cannot always fix everything but there is always something we can do to help relieve symptoms and help you to feel better. Family are holding vigil at bedside.   I discussed with 2 daughters about consideration for hospice facility placement. They express concern for moving her and how this increases her anxiety and discomfort knowing that her time is already so limited. One daughter expresses tearfully that her mother has previously voiced that she never wanted to go to hospice. We agree to continue comfort care here at the hospital for now. Family is able to share how loving and thoughtful Amanda Sellers has always been to all she knew.   All questions/concerns addressed. Emotional support provided.   Exam: Unresponsive during my visit. Breathing shallow but  fairly regular. Abd soft. Extremities edematous and warm to touch - seems febrile.   Plan: - DNR. - Continue comfort care. - Daughters at bedside did not want to pursue hospice placement at this time. I will reassess tomorrow and discuss further with HCPOA.   55 min  Yong Channel, NP Palliative Medicine Team Pager [phone] (Please see [email or web address] for schedule) Team Phone [phone]    Greater than 50%  of this time was spent counseling and coordinating care related to the above assessment and plan

## 2022-07-13 NOTE — Progress Notes (Signed)
PROGRESS NOTE    Amanda Sellers  ZOX:[id number] DOB: 03/15/1932 DOA: 07/10/2022 PCP: Jeoffrey Massed, MD   Brief Narrative:  89 y.o. female with medical history significant for left high-grade B cell non-Hodgkin's lymphoma who recently completed cycle 1 of polatuzumab/Rituxan on 6/6 and did not tolerate it well per family, she is currently  being admitted to the hospital with weakness, dehydration and acute renal failure.  At this point patient's symptoms are significantly advanced with failure to thrive lethargic.  Seen by oncology, patient has very poor prognosis.  Transition to comfort care.   Assessment & Plan:  Principal Problem:   AKI (acute kidney injury) (HCC) Active Problems:   Uncontrolled pain    Acute metabolic encephalopathy, toxic Recurrent diffuse B cell non-Hodgkin's lymphoma Acute kidney injury Adult failure to thrive Heme positive stools Chronic congestive heart failure with preserved ejection fraction Hyperlipidemia Chronic atrial fibrillation Hypothyroidism GERD  At this time patient has been transitioned to comfort care.  Patient is extremely poor prognosis.  Currently remains in the hospital due to intractable pain.  Palliative care team is following.  Anticipate in hospital death.   DVT prophylaxis: None Code Status: DNR Continue hospital stay until pain is under better control       Diet Orders (From admission, onward)     Start     Ordered   07/10/22 1917  Diet regular Room service appropriate? Yes; Fluid consistency: Thin  Diet effective now       Question Answer Comment  Room service appropriate? Yes   Fluid consistency: Thin      07/10/22 1916            Subjective: Daughter at bedside.  Resting comfortably.  Examination:  Patient does not appear to be in any distress at this time.  Resting comfortably.  Unable to assess neuro or psychiatry exam at this time.  Objective: Vitals:   07/11/22 0500 07/11/22 1318 07/12/22  1644 07/13/22 0544  BP: 126/89 (!) 63/42 104/60 (!) 116/97  Pulse: 66 (!) 104 99 (!) 109  Resp: 17   16  Temp: 98.2 F (36.8 C) 99.2 F (37.3 C)  98.9 F (37.2 C)  TempSrc: Oral Axillary  Oral  SpO2:  98%  90%  Weight:      Height:       No intake or output data in the 24 hours ending 07/13/22 0734  Filed Weights   07/10/22 1727  Weight: 91.5 kg    Scheduled Meds:  fentaNYL  1 patch Transdermal Q72H   nystatin   Topical TID   Continuous Infusions:  Nutritional status     Body mass index is 32.56 kg/m.  Data Reviewed:   CBC: Recent Labs  Lab 07/06/22 1331 07/06/22 1553 07/10/22 1228 07/11/22 0610  WBC 6.6 5.6 5.2 3.0*  NEUTROABS 6.0  --  4.3  --   HGB 13.6 13.3 12.4 11.4*  HCT 40.5 38.4 37.0 34.9*  MCV 88.8 87.9 90.0 92.6  PLT 116* 106* 113* 77*   Basic Metabolic Panel: Recent Labs  Lab 07/06/22 1331 07/06/22 1553 07/10/22 1228 07/10/22 1250 07/11/22 0610  NA 135 134* 134*  --  132*  K 4.7 4.5 4.4  --  3.9  CL 92* 92* 96*  --  100  CO2 28 29 24   --  23  GLUCOSE 180* 163* 135*  --  90  BUN 49* 47* 62*  --  63*  CREATININE 1.58* 1.50* 3.09*  --  3.12*  CALCIUM 11.4* 10.9* 10.6*  --  9.7  MG  --   --   --  2.0  --   PHOS 3.6  --   --  3.6  --    GFR: Estimated Creatinine Clearance: 14.5 mL/min (A) (by C-G formula based on SCr of 3.12 mg/dL (H)). Liver Function Tests: Recent Labs  Lab 07/06/22 1331 07/10/22 1228 07/11/22 0610  AST 24 30 36  ALT 16 18 23   ALKPHOS 42 34* 32*  BILITOT 1.0 0.9 1.0  PROT 5.9* 5.2* 4.9*  ALBUMIN 4.0 3.6 2.9*   No results for input(s): "LIPASE", "AMYLASE" in the last 168 hours. No results for input(s): "AMMONIA" in the last 168 hours. Coagulation Profile: No results for input(s): "INR", "PROTIME" in the last 168 hours. Cardiac Enzymes: No results for input(s): "CKTOTAL", "CKMB", "CKMBINDEX", "TROPONINI" in the last 168 hours. BNP (last 3 results) No results for input(s): "PROBNP" in the last 8760  hours. HbA1C: No results for input(s): "HGBA1C" in the last 72 hours. CBG: Recent Labs  Lab 07/06/22 1554  GLUCAP 166*   Lipid Profile: No results for input(s): "CHOL", "HDL", "LDLCALC", "TRIG", "CHOLHDL", "LDLDIRECT" in the last 72 hours. Thyroid Function Tests: No results for input(s): "TSH", "T4TOTAL", "FREET4", "T3FREE", "THYROIDAB" in the last 72 hours. Anemia Panel: No results for input(s): "VITAMINB12", "FOLATE", "FERRITIN", "TIBC", "IRON", "RETICCTPCT" in the last 72 hours. Sepsis Labs: No results for input(s): "PROCALCITON", "LATICACIDVEN" in the last 168 hours.  No results found for this or any previous visit (from the past 240 hour(s)).       Radiology Studies: No results found.         LOS: 3 days   Time spent= 35 mins    Amanda Flury Joline Maxcy, MD [facility]  If 7PM-7AM, please contact night-coverage  07/13/2022, 7:34 AM

## 2022-07-13 NOTE — TOC Progression Note (Signed)
Transition of Care [facility]) - Progression Note    Patient Details  Name: Amanda Sellers MRN: [id number] Date of Birth: 04/05/1932  [facility] CM/SW Contact  Beckie Busing, RN Phone Number:[phone]  07/13/2022, 10:24 AM  Clinical Narrative:    Per notes Anticipated Hospital Death. TOC will sign off.     Expected Discharge Plan: Hospice Medical Facility (End of life care) Barriers to Discharge: Continued Medical Work up (pain management)  Expected Discharge Plan and Services In-house Referral: Hospice / Palliative Care, Chaplain Discharge Planning Services: CM Consult Post Acute Care Choice: NA Living arrangements for the past 2 months: Single Family Home                 DME Arranged: N/A DME Agency: NA       HH Arranged: NA HH Agency: NA         Social Determinants of Health (SDOH) Interventions SDOH Screenings   Food Insecurity: No Food Insecurity (07/10/2022)  Housing: Low Risk  (07/10/2022)  Transportation Needs: No Transportation Needs (07/10/2022)  Utilities: Not At Risk (07/10/2022)  Depression (PHQ2-9): Medium Risk (05/17/2022)  Financial Resource Strain: Low Risk  (01/05/2021)  Physical Activity: Insufficiently Active (01/05/2021)  Social Connections: Moderately Isolated (01/05/2021)  Stress: No Stress Concern Present (01/05/2021)  Tobacco Use: Low Risk  (07/10/2022)    Readmission Risk Interventions    07/12/2022    3:25 PM  Readmission Risk Prevention Plan  Transportation Screening Complete  PCP or Specialist Appt within 3-5 Days Complete  HRI or Home Care Consult Complete  Social Work Consult for Recovery Care Planning/Counseling Complete  Palliative Care Screening Complete  Medication Review Oceanographer) Complete

## 2022-07-14 DIAGNOSIS — Z7189 Other specified counseling: Secondary | ICD-10-CM | POA: Diagnosis not present

## 2022-07-14 DIAGNOSIS — Z79899 Other long term (current) drug therapy: Secondary | ICD-10-CM

## 2022-07-14 DIAGNOSIS — N179 Acute kidney failure, unspecified: Secondary | ICD-10-CM | POA: Diagnosis not present

## 2022-07-14 DIAGNOSIS — Z66 Do not resuscitate: Secondary | ICD-10-CM | POA: Diagnosis not present

## 2022-07-14 DIAGNOSIS — Z515 Encounter for palliative care: Secondary | ICD-10-CM | POA: Diagnosis not present

## 2022-07-14 DIAGNOSIS — G893 Neoplasm related pain (acute) (chronic): Secondary | ICD-10-CM

## 2022-07-14 DIAGNOSIS — F419 Anxiety disorder, unspecified: Secondary | ICD-10-CM

## 2022-07-14 NOTE — Progress Notes (Addendum)
Daily Progress Note   Patient Name: Amanda Sellers [id number]       Date: 07/14/2022 DOB: [birth year]  Age: 89 y.o. MRN#: [id number] Attending Physician: Amanda Nanas, MD Primary Care Physician: Amanda Massed, MD Admit Date: 07/11/2022 Length of Stay: 4 days  Reason for Consultation/Follow-up: Establishing goals of care and Symptom management  Subjective:   CC: Patient appears comfortable laying in bed. Following up regarding complex medical decision making and symptom management at the end of life.   Subjective:  Reviewed EMR prior to presenting to bedside. At time of EMR review, patient had received IV dilaudid 1mg  x3 doses and IV ativan 2mg  x3  doses.   When seeing patient in AM, multiple family members at bedside holding vigil. Patient appeared comfortable sleeping in bed. Symptoms appear well controlled currently.   Patient's daughter asked to speak with this provider individually outside. Again discussed goals remain comfort focused care. Daughter shared that her sister, patient's daughter who is an Charity fundraiser and works at an [employer], is struggling emotional with the decision made to transition to comfort despite it being patient's wishes and 5 of the other children agreeing with this transition. Noted this provider would be happy to speak with patient's daughter later in the day if available when she presented. Discussed continuing comfort focused care at this time.   Discussed care with IDT after visit.   Objective:   Vital Signs:  BP (!) 116/97 (BP Location: Left Arm)   Pulse (!) 109   Temp 98.9 F (37.2 C) (Oral)   Resp 16   Ht 5\' 6"  (1.676 m)   Wt 91.5 kg   SpO2 90%   BMI 32.56 kg/m   Physical Exam: General: NAD, sleeping, appears comfortable laying in bed  Eyes: no drainage noted HENT: dry mucous membranes Cardiovascular: RRR, anasarca preset in all extremities  Respiratory: no increased work of breathing noted, not in respiratory distress Abdomen: not  distended  Imaging:  I personally reviewed recent imaging.   Assessment & Plan:   Assessment: 89 y.o. female  with past medical history of left high-grade B cell non-Hodgkin's lymphoma, anemia, COPD, persistent atrial fibrillation on Eliquis.  She recently completed cycle 1 of chemotherapy on 06/29/22 and did not tolerate it well per family.  She was admitted on 07/23/2022 with weakness, dehydration, and acute kidney injury. Palliative Medicine has been consulted for goals of care in the setting of aggressive lymphoma. Decision made for comfort care 6/18.   Recommendations/Plan: # Complex medical decision making/goals of care:  - Patient was transitioned to full comfort care on 6/18. Continuing with comfort focused care at this time.   -Patient has ACP documentation on file naming HCPOA and primary alternate as well as wishes regarding medical care.   -  Code Status: DNR  # Symptom management:  -Pain/Dyspnea in the setting of end of life care with known aggressive lymphoma    -Continue fentanyl q72 hour patch   -Continue IV dilaudid 1mg  q2hrs prn   -Agitation/Anxiety , in setting of end of life care with known aggressive lymphoma    -Continue IV Ativan 1-2mg  q4hrs prn   # Psychosocial Support:  -total of 6 living children   # Discharge Planning: To Be Determined- in hospital death vs inpatient hospice referral    Discussed with: RN, hospitalist, patient's family   Thank you for allowing the palliative care team to participate in the care Amanda Sellers.  Amanda Morin, DO Palliative Care Provider PMT #  [phone]  If patient remains symptomatic despite maximum doses, please call PMT at [phone] between 0700 and 1900. Outside of these hours, please call attending, as PMT does not have night coverage.    UPDATE: Visited with multiple family members at bedside agin in afternoon. Offered support. Family appropriately holding vigil to process grieving. Offered emotional  support via active listening. Noted palliative provider would plan to follow up with them tomorrow in AM. Patient appears comfortable currently so will continue current medication doses. Did inform family they can always let us know if doses need to be adjusted for comfort.   *Please note that this is a verbal dictation therefore any spelling or grammatical errors are due to the "[employer] One" system interpretation.

## 2022-07-14 NOTE — Progress Notes (Signed)
PROGRESS NOTE    Amanda Sellers  VWU:[id number] DOB: Jul 07, 1932 DOA: 07/05/2022 PCP: Jeoffrey Massed, MD   Brief Narrative:  89 y.o. female with medical history significant for left high-grade B cell non-Hodgkin's lymphoma who recently completed cycle 1 of polatuzumab/Rituxan on 6/6 and did not tolerate it well per family, she is currently  being admitted to the hospital with weakness, dehydration and acute renal failure.  At this point patient's symptoms are significantly advanced with failure to thrive lethargic.  Seen by oncology, patient has very poor prognosis.  Transition to comfort care.   Assessment & Plan:  Principal Problem:   AKI (acute kidney injury) (HCC) Active Problems:   Uncontrolled pain    Acute metabolic encephalopathy, toxic Recurrent diffuse B cell non-Hodgkin's lymphoma Acute kidney injury Adult failure to thrive Heme positive stools Chronic congestive heart failure with preserved ejection fraction Hyperlipidemia Chronic atrial fibrillation Hypothyroidism GERD  Very poor prognosis.  Intractable pain has been under better control now.  Palliative care managing her pain and will continue to engage in discussion regarding transitioning to hospice placement when appropriate. Appreciate help from palliative care team in terms of managing her pain and palliative condition Anticipate in hospital death.   DVT prophylaxis: None Code Status: DNR Continue hospital stay until pain is under better control       Diet Orders (From admission, onward)     Start     Ordered   07/22/2022 1917  Diet regular Room service appropriate? Yes; Fluid consistency: Thin  Diet effective now       Question Answer Comment  Room service appropriate? Yes   Fluid consistency: Thin      07/04/2022 1916            Subjective: Seen at bedside, patient resting comfortably.  Daughter again mentioning to me that her other sister who works at [employer] as an Charity fundraiser is struggling with  decision to transition her to comfort care.  I did explain that unfortunately at this point, her mother's condition is not curable despite of any intervention and should continue to remain our focus on comfort care.  Examination:  Patient does not appear to be in any distress at this time.  Resting comfortably.  Unable to assess neuro or psychiatry exam at this time.  Objective: Vitals:   07/11/22 0500 07/11/22 1318 07/12/22 1644 07/13/22 0544  BP: 126/89 (!) 63/42 104/60 (!) 116/97  Pulse: 66 (!) 104 99 (!) 109  Resp: 17   16  Temp: 98.2 F (36.8 C) 99.2 F (37.3 C)  98.9 F (37.2 C)  TempSrc: Oral Axillary  Oral  SpO2:  98%  90%  Weight:      Height:       No intake or output data in the 24 hours ending 07/14/22 0753  Filed Weights   07/19/2022 1727  Weight: 91.5 kg    Scheduled Meds:  fentaNYL  1 patch Transdermal Q72H   nystatin   Topical TID   Continuous Infusions:  Nutritional status     Body mass index is 32.56 kg/m.  Data Reviewed:   CBC: Recent Labs  Lab 07/05/2022 1228 07/11/22 0610  WBC 5.2 3.0*  NEUTROABS 4.3  --   HGB 12.4 11.4*  HCT 37.0 34.9*  MCV 90.0 92.6  PLT 113* 77*   Basic Metabolic Panel: Recent Labs  Lab 06/24/2022 1228 07/06/2022 1250 07/11/22 0610  NA 134*  --  132*  K 4.4  --  3.9  CL 96*  --  100  CO2 24  --  23  GLUCOSE 135*  --  90  BUN 62*  --  63*  CREATININE 3.09*  --  3.12*  CALCIUM 10.6*  --  9.7  MG  --  2.0  --   PHOS  --  3.6  --    GFR: Estimated Creatinine Clearance: 14.5 mL/min (A) (by C-G formula based on SCr of 3.12 mg/dL (H)). Liver Function Tests: Recent Labs  Lab 07-30-2022 1228 07/11/22 0610  AST 30 36  ALT 18 23  ALKPHOS 34* 32*  BILITOT 0.9 1.0  PROT 5.2* 4.9*  ALBUMIN 3.6 2.9*   No results for input(s): "LIPASE", "AMYLASE" in the last 168 hours. No results for input(s): "AMMONIA" in the last 168 hours. Coagulation Profile: No results for input(s): "INR", "PROTIME" in the last 168  hours. Cardiac Enzymes: No results for input(s): "CKTOTAL", "CKMB", "CKMBINDEX", "TROPONINI" in the last 168 hours. BNP (last 3 results) No results for input(s): "PROBNP" in the last 8760 hours. HbA1C: No results for input(s): "HGBA1C" in the last 72 hours. CBG: No results for input(s): "GLUCAP" in the last 168 hours.  Lipid Profile: No results for input(s): "CHOL", "HDL", "LDLCALC", "TRIG", "CHOLHDL", "LDLDIRECT" in the last 72 hours. Thyroid Function Tests: No results for input(s): "TSH", "T4TOTAL", "FREET4", "T3FREE", "THYROIDAB" in the last 72 hours. Anemia Panel: No results for input(s): "VITAMINB12", "FOLATE", "FERRITIN", "TIBC", "IRON", "RETICCTPCT" in the last 72 hours. Sepsis Labs: No results for input(s): "PROCALCITON", "LATICACIDVEN" in the last 168 hours.  No results found for this or any previous visit (from the past 240 hour(s)).       Radiology Studies: No results found.         LOS: 4 days   Time spent= 35 mins    Latarsha Zani Joline Maxcy, MD [facility]  If 7PM-7AM, please contact night-coverage  07/14/2022, 7:53 AM

## 2022-07-15 DIAGNOSIS — R4589 Other symptoms and signs involving emotional state: Secondary | ICD-10-CM

## 2022-07-15 DIAGNOSIS — N179 Acute kidney failure, unspecified: Secondary | ICD-10-CM | POA: Diagnosis not present

## 2022-07-15 DIAGNOSIS — Z515 Encounter for palliative care: Secondary | ICD-10-CM | POA: Diagnosis not present

## 2022-07-15 DIAGNOSIS — G893 Neoplasm related pain (acute) (chronic): Secondary | ICD-10-CM | POA: Diagnosis not present

## 2022-07-15 DIAGNOSIS — Z7189 Other specified counseling: Secondary | ICD-10-CM | POA: Diagnosis not present

## 2022-07-15 NOTE — Progress Notes (Signed)
PROGRESS NOTE    Amanda Sellers  WUJ:[id number] DOB: January 29, 1932 DOA: 07/02/2022 PCP: Jeoffrey Massed, MD   Brief Narrative:  89 y.o. female with medical history significant for left high-grade B cell non-Hodgkin's lymphoma who recently completed cycle 1 of polatuzumab/Rituxan on 6/6 and did not tolerate it well per family, she is currently  being admitted to the hospital with weakness, dehydration and acute renal failure.  At this point patient's symptoms are significantly advanced with failure to thrive lethargic.  Seen by oncology, patient has very poor prognosis.  Transition to comfort care.   Assessment & Plan:  Principal Problem:   AKI (acute kidney injury) (HCC) Active Problems:   Uncontrolled pain   Palliative care encounter   Goals of care, counseling/discussion   Counseling and coordination of care   End of life care   DNR (do not resuscitate)   Anxiety   Cancer associated pain   Medication management    Acute metabolic encephalopathy, toxic Recurrent diffuse B cell non-Hodgkin's lymphoma Acute kidney injury Adult failure to thrive Heme positive stools Chronic congestive heart failure with preserved ejection fraction Hyperlipidemia Chronic atrial fibrillation Hypothyroidism GERD  Very poor prognosis.  Intractable pain has been under better control now.  Palliative care managing her pain and will continue to engage in discussion regarding transitioning to hospice placement when appropriate. Appreciate help from palliative care team in terms of managing her pain and palliative condition Anticipating in-hospital death  DVT prophylaxis: None Code Status: DNR Continue hospital stay until pain is under better control       Diet Orders (From admission, onward)     Start     Ordered   07/18/2022 1917  Diet regular Room service appropriate? Yes; Fluid consistency: Thin  Diet effective now       Question Answer Comment  Room service appropriate? Yes   Fluid  consistency: Thin      06/27/2022 1916            Subjective: Sleeping/resting comfortably.  No other complaints Daughter at bedside  Examination:  Patient does not appear to be in any distress at this time.  Resting comfortably.  Unable to assess neuro or psychiatry exam at this time.  Objective: Vitals:   07/12/22 1644 07/13/22 0544 07/15/22 0515 07/15/22 0516  BP: 104/60 (!) 116/97 (!) 82/35 (!) 82/35  Pulse: 99 (!) 109 (!) 113 (!) 122  Resp:  16    Temp:  98.9 F (37.2 C)    TempSrc:  Oral    SpO2:  90% 90% (!) 89%  Weight:      Height:       No intake or output data in the 24 hours ending 07/15/22 1032  Filed Weights   07/18/2022 1727  Weight: 91.5 kg    Scheduled Meds:  fentaNYL  1 patch Transdermal Q72H   nystatin   Topical TID   Continuous Infusions:  Nutritional status     Body mass index is 32.56 kg/m.  Data Reviewed:   CBC: Recent Labs  Lab 07/12/2022 1228 07/11/22 0610  WBC 5.2 3.0*  NEUTROABS 4.3  --   HGB 12.4 11.4*  HCT 37.0 34.9*  MCV 90.0 92.6  PLT 113* 77*   Basic Metabolic Panel: Recent Labs  Lab 06/25/2022 1228 07/15/2022 1250 07/11/22 0610  NA 134*  --  132*  K 4.4  --  3.9  CL 96*  --  100  CO2 24  --  23  GLUCOSE 135*  --  90  BUN 62*  --  63*  CREATININE 3.09*  --  3.12*  CALCIUM 10.6*  --  9.7  MG  --  2.0  --   PHOS  --  3.6  --    GFR: Estimated Creatinine Clearance: 14.5 mL/min (A) (by C-G formula based on SCr of 3.12 mg/dL (H)). Liver Function Tests: Recent Labs  Lab 07/01/2022 1228 07/11/22 0610  AST 30 36  ALT 18 23  ALKPHOS 34* 32*  BILITOT 0.9 1.0  PROT 5.2* 4.9*  ALBUMIN 3.6 2.9*   No results for input(s): "LIPASE", "AMYLASE" in the last 168 hours. No results for input(s): "AMMONIA" in the last 168 hours. Coagulation Profile: No results for input(s): "INR", "PROTIME" in the last 168 hours. Cardiac Enzymes: No results for input(s): "CKTOTAL", "CKMB", "CKMBINDEX", "TROPONINI" in the last 168  hours. BNP (last 3 results) No results for input(s): "PROBNP" in the last 8760 hours. HbA1C: No results for input(s): "HGBA1C" in the last 72 hours. CBG: No results for input(s): "GLUCAP" in the last 168 hours.  Lipid Profile: No results for input(s): "CHOL", "HDL", "LDLCALC", "TRIG", "CHOLHDL", "LDLDIRECT" in the last 72 hours. Thyroid Function Tests: No results for input(s): "TSH", "T4TOTAL", "FREET4", "T3FREE", "THYROIDAB" in the last 72 hours. Anemia Panel: No results for input(s): "VITAMINB12", "FOLATE", "FERRITIN", "TIBC", "IRON", "RETICCTPCT" in the last 72 hours. Sepsis Labs: No results for input(s): "PROCALCITON", "LATICACIDVEN" in the last 168 hours.  No results found for this or any previous visit (from the past 240 hour(s)).       Radiology Studies: No results found.         LOS: 5 days   Time spent= 35 mins    Tyronn Golda Joline Maxcy, MD [facility]  If 7PM-7AM, please contact night-coverage  07/15/2022, 10:32 AM

## 2022-07-15 NOTE — Progress Notes (Signed)
Daily Progress Note   Patient Name: Amanda Sellers RUEAVWU       Date: 07/15/2022 DOB: Aug 13, 1932  Age: 89 y.o. MRN#: [id number] Attending Physician: Dimple Nanas, MD Primary Care Physician: Jeoffrey Massed, MD Admit Date: 07/12/2022 Length of Stay: 5 days  Reason for Consultation/Follow-up: Establishing goals of care and Symptom management  Subjective:   CC: Patient appears comfortable laying in bed. Following up regarding complex medical decision making and symptom management at the end of life.   Subjective:  Reviewed EMR prior to presenting to bedside. At time of EMR review, patient had received IV dilaudid 1mg  x2 doses and IV ativan 2mg  x2  doses.  At time of EMR review this AM, BP noted to be 82/35.  Presented to bedside to follow-up with family.  Patient seen laying unresponsive in the bed.  Patient was noted to have increased work of breathing and secretions appreciated.  Discussed care with patient's daughter/HCPOA at bedside.  Updated on patient's hypotension noted this morning.  Patient too unstable for transfer to inpatient hospice at this time.  Currently in-hospital death anticipated.  Noted would provide medications for pain and secretions.  Daughter agreeing with plan.  Spent time providing emotional support via active listening.  Noted palliative medicine team will continue to follow along with patient's medical journey.  Discussed care with IDT after visit.   Objective:   Vital Signs:  BP (!) 82/35   Pulse (!) 122   Temp 98.9 F (37.2 C) (Oral)   Resp 16   Ht 5\' 6"  (1.676 m)   Wt 91.5 kg   SpO2 (!) 89%   BMI 32.56 kg/m   Physical Exam: General: unresponsive, secretions heard  Eyes: no drainage noted HENT: dry mucous membranes Cardiovascular: RRR, anasarca preset in all extremities  Respiratory: slightly increased work of breathing noted Abdomen: not distended  Imaging:  I personally reviewed recent imaging.   Assessment & Plan:   Assessment: 89  y.o. female  with past medical history of left high-grade B cell non-Hodgkin's lymphoma, anemia, COPD, persistent atrial fibrillation on Eliquis.  She recently completed cycle 1 of chemotherapy on 06/29/22 and did not tolerate it well per family.  She was admitted on 07/04/2022 with weakness, dehydration, and acute kidney injury. Palliative Medicine has been consulted for goals of care in the setting of aggressive lymphoma. Decision made for comfort care 6/18.   Recommendations/Plan: # Complex medical decision making/goals of care:  - Patient was transitioned to full comfort care on 6/18. Continuing with comfort focused care at this time. Based on examination and vitals today, in hospital death anticipated. Not stable for transfer to inpatient hospice at this time.   -Patient has ACP documentation on file naming HCPOA and primary alternate as well as wishes regarding medical care.   -  Code Status: DNR  # Symptom management:  -Pain/Dyspnea in the setting of end of life care with known aggressive lymphoma    -Continue fentanyl q72 hour patch   -Continue IV dilaudid 1mg  q2hrs prn   -Agitation/Anxiety , in setting of end of life care with known aggressive lymphoma    -Continue IV Ativan 1-2mg  q4hrs prn   # Psychosocial Support:  -total of 6 living children   # Discharge Planning: In hospital death anticipated. Continue comfort focused care.    Discussed with: RN, hospitalist, patient's family   Thank you for allowing the palliative care team to participate in the care Daine Gravel Reeves.  Alvester Morin, DO  Palliative Care Provider PMT # [phone]  If patient remains symptomatic despite maximum doses, please call PMT at [phone] between 0700 and 1900. Outside of these hours, please call attending, as PMT does not have night coverage.    This provider spent a total of 43 minutes providing patient's care.  Includes review of EMR, discussing care with other staff members involved in  patient's medical care, obtaining relevant history and information from patient and/or patient's family, and personal review of imaging and lab work. Greater than 50% of the time was spent counseling and coordinating care related to the above assessment and plan.    *Please note that this is a verbal dictation therefore any spelling or grammatical errors are due to the "[employer]" system interpretation.

## 2022-07-16 DIAGNOSIS — N179 Acute kidney failure, unspecified: Secondary | ICD-10-CM | POA: Diagnosis not present

## 2022-07-16 MED ORDER — SCOPOLAMINE 1 MG/3DAYS TD PT72
1.0000 | MEDICATED_PATCH | TRANSDERMAL | Status: DC
  Administered 2022-07-16: 1.5 mg via TRANSDERMAL
  Filled 2022-07-16: qty 1

## 2022-07-16 NOTE — Progress Notes (Signed)
  Daily Progress Note   Patient Name: Amanda Sellers       Date: 07/16/2022 DOB: [birth year]  Age: 89 y.o. MRN#: [id number] Attending Physician: Dimple Nanas, MD Primary Care Physician: Jeoffrey Massed, MD Admit Date: 06/26/2022 Length of Stay: 6 days  Discussed care with primary hospitalist today. Patient remains unresponsive. Multiple family members visiting. Patient appears comfortable with current regimen. After discussion with hospitalist, asked [facility] provider to check with family again to make sure now desire for hospice transfer. Family does not wish to move patient. In hospital death anticipated. Continue comfort focused care.    Alvester Morin, DO Palliative Care Provider PMT # [phone]

## 2022-07-16 NOTE — Progress Notes (Signed)
PROGRESS NOTE    Amanda Sellers  ZOX:[id number] DOB: [birth year] DOA: 07/05/2022 PCP: Jeoffrey Massed, MD   Brief Narrative:  89 y.o. female with medical history significant for left high-grade B cell non-Hodgkin's lymphoma who recently completed cycle 1 of polatuzumab/Rituxan on 6/6 and did not tolerate it well per family, she is currently  being admitted to the hospital with weakness, dehydration and acute renal failure.  At this point patient's symptoms are significantly advanced with failure to thrive lethargic.  Seen by oncology, patient has very poor prognosis.  Currently on comfort care   Assessment & Plan:  Principal Problem:   AKI (acute kidney injury) (HCC) Active Problems:   Uncontrolled pain   Palliative care encounter   Goals of care, counseling/discussion   Counseling and coordination of care   End of life care   DNR (do not resuscitate)   Anxiety   Cancer associated pain   Medication management   Need for emotional support    Acute metabolic encephalopathy, toxic Recurrent diffuse B cell non-Hodgkin's lymphoma Acute kidney injury Adult failure to thrive Heme positive stools Chronic congestive heart failure with preserved ejection fraction Hyperlipidemia Chronic atrial fibrillation Hypothyroidism GERD  Very poor prognosis.  Intractable pain has been under better control now.  Palliative care managing her pain and will continue to engage in discussion regarding transitioning to hospice placement when appropriate. Appreciate help from palliative care team in terms of managing her pain and palliative condition In comparing in-hospital death  DVT prophylaxis: None Code Status: DNR Continue hospital stay until pain is under better control Multiple family members at bedside.      Diet Orders (From admission, onward)     Start     Ordered   07/13/2022 1917  Diet regular Room service appropriate? Yes; Fluid consistency: Thin  Diet effective now        Question Answer Comment  Room service appropriate? Yes   Fluid consistency: Thin      07/15/2022 1916            Subjective: Seen at bedside.  Multiple family members.  Patient remains unresponsive.  Certainly hear more secretions today.  Examination:  Patient does not appear to be in any distress at this time.  Resting comfortably.  Unable to assess neuro or psychiatry exam at this time.  Objective: Vitals:   07/15/22 0515 07/15/22 0516 07/15/22 2238 07/16/22 0645  BP: (!) 82/35 (!) 82/35 (!) 52/40 (!) 64/42  Pulse: (!) 113 (!) 122 (!) 109 (!) 115  Resp:      Temp:      TempSrc:      SpO2: 90% (!) 89% (!) 2%   Weight:      Height:       No intake or output data in the 24 hours ending 07/16/22 0957  Filed Weights   07/07/2022 1727  Weight: 91.5 kg    Scheduled Meds:  fentaNYL  1 patch Transdermal Q72H   nystatin   Topical TID   Continuous Infusions:  Nutritional status     Body mass index is 32.56 kg/m.  Data Reviewed:   CBC: Recent Labs  Lab 07/21/2022 1228 07/11/22 0610  WBC 5.2 3.0*  NEUTROABS 4.3  --   HGB 12.4 11.4*  HCT 37.0 34.9*  MCV 90.0 92.6  PLT 113* 77*   Basic Metabolic Panel: Recent Labs  Lab 07/11/2022 1228 06/30/2022 1250 07/11/22 0610  NA 134*  --  132*  K 4.4  --  3.9  CL 96*  --  100  CO2 24  --  23  GLUCOSE 135*  --  90  BUN 62*  --  63*  CREATININE 3.09*  --  3.12*  CALCIUM 10.6*  --  9.7  MG  --  2.0  --   PHOS  --  3.6  --    GFR: Estimated Creatinine Clearance: 14.5 mL/min (A) (by C-G formula based on SCr of 3.12 mg/dL (H)). Liver Function Tests: Recent Labs  Lab 06/26/2022 1228 07/11/22 0610  AST 30 36  ALT 18 23  ALKPHOS 34* 32*  BILITOT 0.9 1.0  PROT 5.2* 4.9*  ALBUMIN 3.6 2.9*   No results for input(s): "LIPASE", "AMYLASE" in the last 168 hours. No results for input(s): "AMMONIA" in the last 168 hours. Coagulation Profile: No results for input(s): "INR", "PROTIME" in the last 168 hours. Cardiac  Enzymes: No results for input(s): "CKTOTAL", "CKMB", "CKMBINDEX", "TROPONINI" in the last 168 hours. BNP (last 3 results) No results for input(s): "PROBNP" in the last 8760 hours. HbA1C: No results for input(s): "HGBA1C" in the last 72 hours. CBG: No results for input(s): "GLUCAP" in the last 168 hours.  Lipid Profile: No results for input(s): "CHOL", "HDL", "LDLCALC", "TRIG", "CHOLHDL", "LDLDIRECT" in the last 72 hours. Thyroid Function Tests: No results for input(s): "TSH", "T4TOTAL", "FREET4", "T3FREE", "THYROIDAB" in the last 72 hours. Anemia Panel: No results for input(s): "VITAMINB12", "FOLATE", "FERRITIN", "TIBC", "IRON", "RETICCTPCT" in the last 72 hours. Sepsis Labs: No results for input(s): "PROCALCITON", "LATICACIDVEN" in the last 168 hours.  No results found for this or any previous visit (from the past 240 hour(s)).       Radiology Studies: No results found.         LOS: 6 days   Time spent= 35 mins    Tatelyn Vanhecke Joline Maxcy, MD [facility]  If 7PM-7AM, please contact night-coverage  07/16/2022, 9:57 AM

## 2022-07-16 NOTE — Progress Notes (Signed)
Civil engineer, contracting [facility]) Hospital Liaison Note  Referral received for patient/family interest in [facility]. [facility] liaison spoke with patient's daughter Aram Beecham to confirm interest.   Aram Beecham states at this time they are not interested in moving patient. She is aware to call [facility] if anything changes.   Please call with any questions or concerns. Thank you  Dionicio Stall, [facility] Liaison [phone]

## 2022-07-20 ENCOUNTER — Inpatient Hospital Stay: Payer: PPO

## 2022-07-20 ENCOUNTER — Inpatient Hospital Stay: Payer: PPO | Admitting: Nurse Practitioner

## 2022-07-24 ENCOUNTER — Other Ambulatory Visit: Payer: Self-pay | Admitting: Family Medicine

## 2022-07-24 NOTE — Progress Notes (Cosign Needed Addendum)
This RN was called into patient's room at around 0408 am. Patient took a few last breaths and expired at 0414 am. Three of her daughters were present at bedside. They called and notified the other family members.  Death was verified by 2 RN's: Myself and Cristal Generous, RN. Family has all of patient's belongings ready to take home.  NP on call notified. Family is still at bedside. Priscille Kluver

## 2022-07-24 NOTE — Addendum Note (Signed)
Encounter addended by: Edward Qualia on: 07/24/2022 10:34 AM  Actions taken: Imaging Exam ended

## 2022-07-24 NOTE — Progress Notes (Signed)
2nd Rn Verification for TOD. No heart tones auscultated. No respirations visualized.

## 2022-07-24 NOTE — Progress Notes (Signed)
Report given to [facility] RN. She will be transporting patient to morgue this am and calling patient placement afterwards.Priscille Kluver

## 2022-07-24 NOTE — Progress Notes (Cosign Needed)
Family just left at 0610 am. Postmortem care performed. PICC line pulled. Fentanyl patch wasted w/ 2nd RN Cristal Generous. Waiting for transport to take patient to morgue. Priscille Kluver

## 2022-07-24 NOTE — Death Summary Note (Signed)
DEATH SUMMARY   Patient Details  Name: Amanda Sellers MRN: [id number] DOB: 1932/06/09 WUJ:[id number], Maryjean Morn, MD Admission/Discharge Information   Admit Date:  08/03/2022  Date of Death: Date of Death: 08-10-22  Time of Death: Time of Death: 0414  Length of Stay: 7   Principle Cause of death: Diffuse B Cell Non hodgkins Lymphoma  Hospital Diagnoses: Principal Problem:   AKI (acute kidney injury) (HCC) Active Problems:   Uncontrolled pain   Palliative care encounter   Goals of care, counseling/discussion   Counseling and coordination of care   End of life care   DNR (do not resuscitate)   Anxiety   Cancer associated pain   Medication management   Need for emotional support   Hospital Course: 88 y.o. female with medical history significant for left high-grade B cell non-Hodgkin's lymphoma who recently completed cycle 1 of polatuzumab/Rituxan on 6/6 and did not tolerate it well per family, she is currently  being admitted to the hospital with weakness, dehydration and acute renal failure.  At this point patient's symptoms are significantly advanced with failure to thrive lethargic.  Seen by oncology, patient has very poor prognosis.  Currently on comfort care.  Eventually patient passed away at the time mentioned above. Patient was pronounced by 2 RNs  Assessment and Plan: Acute metabolic encephalopathy, toxic Recurrent diffuse B cell non-Hodgkin's lymphoma Acute kidney injury Adult failure to thrive Heme positive stools Chronic congestive heart failure with preserved ejection fraction Hyperlipidemia Chronic atrial fibrillation Hypothyroidism GERD   Very poor prognosis.  Patient was transition to comfort care.  Eventually patient passed away on Aug 10, 2022 around 4:15 AM   Procedures: None  Consultations: Palliative care, oncology  The results of significant diagnostics from this hospitalization (including imaging, microbiology, ancillary and laboratory) are listed  below for reference.   Significant Diagnostic Studies: CT Head Wo Contrast  Result Date: 07/06/2022 CLINICAL DATA:  Neuro deficit, acute, stroke suspected right sided weakness, resolved EXAM: CT HEAD WITHOUT CONTRAST TECHNIQUE: Contiguous axial images were obtained from the base of the skull through the vertex without intravenous contrast. RADIATION DOSE REDUCTION: This exam was performed according to the departmental dose-optimization program which includes automated exposure control, adjustment of the mA and/or kV according to patient size and/or use of iterative reconstruction technique. COMPARISON:  CT Head 05/25/20 FINDINGS: Brain: No evidence of acute infarction, hemorrhage, hydrocephalus, extra-axial collection or mass lesion/mass effect. There is sequela of moderate chronic microvascular ischemic change. Unchanged dural calcification along the left cerebral convexity. Vascular: No hyperdense vessel or unexpected calcification. Skull: Normal. Negative for fracture or focal lesion. Sinuses/Orbits: No middle ear or mastoid effusion. Paranasal sinuses are clear. Bilateral lens replacement. Orbits are otherwise unremarkable. Other: None IMPRESSION: 1. No hemorrhage or CT evidence of an acute cortical infarct. If there is persistent clinical concern for acute infarction, consider further evaluation with brain MRI. 2. Sequela of moderate chronic microvascular ischemic change. Electronically Signed   By: Lorenza Cambridge M.D.   On: 07/06/2022 15:47   IR PICC PLACEMENT LEFT >5 YRS INC IMG GUIDE  Result Date: 06/28/2022 INDICATION: RECURRENT LYMPHOMA, ACCESS FOR CHEMOTHERAPY EXAM: ULTRASOUND AND FLUOROSCOPIC GUIDED PICC LINE INSERTION MEDICATIONS: 1% LIDOCAINE LOCAL CONTRAST:  None. FLUOROSCOPY TIME:  Twelve seconds (1 mGy) COMPLICATIONS: None immediate. TECHNIQUE: The procedure, risks, benefits, and alternatives were explained to the patient and informed written consent was obtained. A timeout was performed prior  to the initiation of the procedure. The left upper extremity was prepped with chlorhexidine in  a sterile fashion, and a sterile drape was applied covering the operative field. Maximum barrier sterile technique with sterile gowns and gloves were used for the procedure. A timeout was performed prior to the initiation of the procedure. Local anesthesia was provided with 1% lidocaine. Under direct ultrasound guidance, the left basilic vein was accessed with a micropuncture kit after the overlying soft tissues were anesthetized with 1% lidocaine. An ultrasound image was saved for documentation purposes. A guidewire was advanced to the level of the superior caval-atrial junction for measurement purposes and the PICC line was cut to length. A peel-away sheath was placed and a 42 cm, 5 [place], dual lumen was inserted to level of the superior caval-atrial junction. A post procedure spot fluoroscopic was obtained. The catheter easily aspirated and flushed and was sutured in place. A dressing was placed. The patient tolerated the procedure well without immediate post procedural complication. FINDINGS: After catheter placement, the tip lies within the superior cavoatrial junction. The catheter aspirates and flushes normally and is ready for immediate use. IMPRESSION: Successful ultrasound and fluoroscopic guided placement of a left basilic vein approach, 42 cm, 5 French, dual lumen PICC with tip at the superior caval-atrial junction. The PICC line is ready for immediate use. Electronically Signed   By: Judie Petit.  Shick M.D.   On: 06/28/2022 12:42   CT CHEST ABDOMEN PELVIS WO CONTRAST  Result Date: 06/25/2022 CLINICAL DATA:  Recurrent non-Hodgkin lymphoma. Assess treatment response. * Tracking Code: BO * EXAM: CT CHEST, ABDOMEN AND PELVIS WITHOUT CONTRAST TECHNIQUE: Multidetector CT imaging of the chest, abdomen and pelvis was performed following the standard protocol without IV contrast. RADIATION DOSE REDUCTION: This exam was  performed according to the departmental dose-optimization program which includes automated exposure control, adjustment of the mA and/or kV according to patient size and/or use of iterative reconstruction technique. COMPARISON:  02/23/2022 CT chest, abdomen and pelvis. FINDINGS: CT CHEST FINDINGS Cardiovascular: Normal heart size. No significant pericardial effusion/thickening. Left anterior descending and left circumflex coronary atherosclerosis. Atherosclerotic nonaneurysmal thoracic aorta. Normal caliber pulmonary arteries. Mediastinum/Nodes: No significant thyroid nodules. Unremarkable esophagus. Numerous newly enlarged right axillary lymph nodes measuring up to the 2.4 cm short axis diameter (series 4/image 24). More superiorly in the right axilla, there is in ill-defined 8.9 x 5.3 cm solid mass with surrounding fat infiltration (series 4/image 11), intimately associated with posterior margin of the right pectoralis muscles, markedly increased from 2.8 x 1.5 cm on 02/23/2022 CT. No left axillary adenopathy. New mild right internal mammary adenopathy up to 1.0 cm short axis diameter (series 4/image 22). No additional pathologically enlarged mediastinal nodes. No discrete hilar adenopathy on these noncontrast images. Lungs/Pleura: No pneumothorax. No pleural effusion. Several (greater than 5) new indistinct solid pulmonary nodules scattered throughout the right greater than left lungs, for example measuring 1.7 cm in the medial basilar right lower lobe (series 6/image 111), 0.7 cm in the medial superior segment right lower lobe (series 6/image 52) and 0.4 cm in the peripheral right middle lobe (series 6/image 72). Anterior left upper lobe 0.3 cm nodule (series 6/image 42) is stable. Musculoskeletal: No aggressive appearing focal osseous lesions. Asymmetric right breast enlargement, right breast skin thickening and right breast parenchymal density, significantly worsened in the interval. Chronic mild T6 and  moderate T12 vertebral compression fractures. Mild-to-moderate thoracic spondylosis. CT ABDOMEN PELVIS FINDINGS Hepatobiliary: At least 3 new indistinct hypodense liver lesions measuring 1.3 cm at the left liver dome (series 4/image 45), 1.7 cm at the anterior right liver dome (  series 4/image 47) and 1.3 cm in the peripheral right liver (series 4/image 53). Cholelithiasis. No biliary ductal dilatation. Pancreas: Normal, with no mass or duct dilation. Spleen: Normal size. No mass. Adrenals/Urinary Tract: Normal adrenals. No hydronephrosis. Punctate calcifications in the mid to lower right renal sinus, favor vascular calcifications. Normal bladder. Stomach/Bowel: Large hiatal hernia. Stomach is nondistended and otherwise normal. Normal caliber small bowel with no small bowel wall thickening. Normal appendix. Oral contrast transits to the right colon. Normal large bowel with no diverticulosis, large bowel wall thickening or pericolonic fat stranding. Vascular/Lymphatic: Atherosclerotic nonaneurysmal abdominal aorta. No pathologically enlarged lymph nodes in the abdomen or pelvis. Reproductive: Grossly normal uterus.  No adnexal mass. Other: No pneumoperitoneum, ascites or focal fluid collection. Increased subcutaneous edema throughout the lateral right abdominal wall. Musculoskeletal: No aggressive appearing focal osseous lesions. Fixation hardware partially visualized in the proximal left femur. Marked lumbar spondylosis. IMPRESSION: 1. Significant interval progression of disease. 2. Bulky right axillary lymphadenopathy is substantially increased since 02/23/2022 CT and potentially muscle invasive into the right pectoralis muscles. New right internal mammary lymphadenopathy. 3. Several new indistinct solid pulmonary nodules scattered throughout the right greater than left lungs, suspicious for pulmonary lymphoma. 4. At least three new indistinct hypodense liver lesions, nonspecific but suspicious for hepatic lymphoma.  5. Asymmetric right breast enlargement, right breast skin thickening and right breast parenchymal density, significantly worsened in the interval. Increased subcutaneous edema throughout the lateral right abdominal wall. 6. Chronic findings include: Large hiatal hernia. Cholelithiasis. Chronic mild T6 and moderate T12 vertebral compression fractures. Aortic Atherosclerosis (ICD10-I70.0). Electronically Signed   By: Delbert Phenix M.D.   On: 06/25/2022 12:43    Microbiology: No results found for this or any previous visit (from the past 240 hour(s)).  Time spent: 35 minutes  Signed: Elisa Kutner Joline Maxcy, MD 2022-08-13

## 2022-07-24 NOTE — Progress Notes (Addendum)
    OVERNIGHT PROGRESS REPORT  Notified by RN that patient is deceased as of 36 Hrs Patient was DNR/CMO (comfort measures only) followed by Palliative.  2 RN verified.  Family was available to RN at bedside.Chinita Greenland MSNA [id number]-AG Acute Care Nurse Practitioner [facility]

## 2022-07-24 DEATH — deceased

## 2022-08-02 ENCOUNTER — Ambulatory Visit: Payer: PPO | Admitting: Cardiology

## 2022-08-16 ENCOUNTER — Ambulatory Visit: Payer: PPO | Admitting: Family Medicine

## 2022-09-28 ENCOUNTER — Inpatient Hospital Stay: Payer: PPO

## 2022-09-28 ENCOUNTER — Ambulatory Visit: Payer: PPO | Admitting: Oncology

## 2023-02-21 ENCOUNTER — Ambulatory Visit: Payer: PPO
